# Patient Record
Sex: Male | Born: 1935 | Race: White | Hispanic: No | Marital: Married | Smoking: Never smoker
Health system: Southern US, Community
[De-identification: ages and names within clinical notes are randomized; demographics above are authoritative.]

## PROBLEM LIST (undated history)

## (undated) DIAGNOSIS — I351 Nonrheumatic aortic (valve) insufficiency: Secondary | ICD-10-CM

## (undated) DIAGNOSIS — I459 Conduction disorder, unspecified: Secondary | ICD-10-CM

## (undated) DIAGNOSIS — IMO0002 Reserved for concepts with insufficient information to code with codable children: Secondary | ICD-10-CM

## (undated) DIAGNOSIS — I2510 Atherosclerotic heart disease of native coronary artery without angina pectoris: Secondary | ICD-10-CM

## (undated) DIAGNOSIS — I454 Nonspecific intraventricular block: Secondary | ICD-10-CM

## (undated) DIAGNOSIS — M549 Dorsalgia, unspecified: Secondary | ICD-10-CM

## (undated) DIAGNOSIS — I480 Paroxysmal atrial fibrillation: Secondary | ICD-10-CM

## (undated) DIAGNOSIS — C642 Malignant neoplasm of left kidney, except renal pelvis: Secondary | ICD-10-CM

## (undated) DIAGNOSIS — I10 Essential (primary) hypertension: Secondary | ICD-10-CM

## (undated) DIAGNOSIS — Z8739 Personal history of other diseases of the musculoskeletal system and connective tissue: Secondary | ICD-10-CM

## (undated) DIAGNOSIS — I340 Nonrheumatic mitral (valve) insufficiency: Secondary | ICD-10-CM

## (undated) DIAGNOSIS — Z950 Presence of cardiac pacemaker: Secondary | ICD-10-CM

## (undated) DIAGNOSIS — Z8719 Personal history of other diseases of the digestive system: Secondary | ICD-10-CM

## (undated) DIAGNOSIS — K219 Gastro-esophageal reflux disease without esophagitis: Secondary | ICD-10-CM

## (undated) DIAGNOSIS — I639 Cerebral infarction, unspecified: Secondary | ICD-10-CM

## (undated) DIAGNOSIS — G43909 Migraine, unspecified, not intractable, without status migrainosus: Secondary | ICD-10-CM

## (undated) DIAGNOSIS — N183 Chronic kidney disease, stage 3 unspecified: Secondary | ICD-10-CM

## (undated) DIAGNOSIS — Z5181 Encounter for therapeutic drug level monitoring: Secondary | ICD-10-CM

## (undated) DIAGNOSIS — G8929 Other chronic pain: Secondary | ICD-10-CM

## (undated) DIAGNOSIS — I4892 Unspecified atrial flutter: Secondary | ICD-10-CM

## (undated) DIAGNOSIS — A6920 Lyme disease, unspecified: Secondary | ICD-10-CM

## (undated) DIAGNOSIS — E785 Hyperlipidemia, unspecified: Secondary | ICD-10-CM

## (undated) DIAGNOSIS — I359 Nonrheumatic aortic valve disorder, unspecified: Secondary | ICD-10-CM

## (undated) DIAGNOSIS — I493 Ventricular premature depolarization: Secondary | ICD-10-CM

## (undated) DIAGNOSIS — M1990 Unspecified osteoarthritis, unspecified site: Secondary | ICD-10-CM

## (undated) DIAGNOSIS — I428 Other cardiomyopathies: Secondary | ICD-10-CM

## (undated) DIAGNOSIS — M109 Gout, unspecified: Secondary | ICD-10-CM

## (undated) DIAGNOSIS — R0600 Dyspnea, unspecified: Secondary | ICD-10-CM

## (undated) DIAGNOSIS — M4800 Spinal stenosis, site unspecified: Secondary | ICD-10-CM

## (undated) DIAGNOSIS — C4491 Basal cell carcinoma of skin, unspecified: Secondary | ICD-10-CM

## (undated) DIAGNOSIS — I442 Atrioventricular block, complete: Secondary | ICD-10-CM

## (undated) DIAGNOSIS — G459 Transient cerebral ischemic attack, unspecified: Secondary | ICD-10-CM

## (undated) DIAGNOSIS — Z79899 Other long term (current) drug therapy: Secondary | ICD-10-CM

## (undated) HISTORY — PX: FRACTURE SURGERY: SHX138

## (undated) HISTORY — PX: CARDIAC CATHETERIZATION: SHX172

## (undated) HISTORY — DX: Nonrheumatic aortic valve disorder, unspecified: I35.9

## (undated) HISTORY — DX: Atherosclerotic heart disease of native coronary artery without angina pectoris: I25.10

## (undated) HISTORY — PX: SQUAMOUS CELL CARCINOMA EXCISION: SHX2433

## (undated) HISTORY — PX: PROSTATE SURGERY: SHX751

## (undated) HISTORY — PX: BIOPSY PROSTATE: PRO28

## (undated) HISTORY — DX: Chronic kidney disease, stage 3 unspecified: N18.30

## (undated) HISTORY — DX: Nonrheumatic mitral (valve) insufficiency: I34.0

## (undated) HISTORY — DX: Other cardiomyopathies: I42.8

## (undated) HISTORY — DX: Paroxysmal atrial fibrillation: I48.0

## (undated) HISTORY — DX: Nonrheumatic aortic (valve) insufficiency: I35.1

## (undated) HISTORY — DX: Essential (primary) hypertension: I10

## (undated) HISTORY — PX: BASAL CELL CARCINOMA EXCISION: SHX1214

## (undated) HISTORY — DX: Ventricular premature depolarization: I49.3

## (undated) HISTORY — DX: Cerebral infarction, unspecified: I63.9

## (undated) HISTORY — PX: TONSILLECTOMY AND ADENOIDECTOMY: SUR1326

## (undated) HISTORY — DX: Gout, unspecified: M10.9

## (undated) HISTORY — DX: Unspecified atrial flutter: I48.92

## (undated) HISTORY — DX: Lyme disease, unspecified: A69.20

## (undated) HISTORY — PX: LAPAROSCOPIC ABLATION RENAL MASS: SUR751

## (undated) HISTORY — DX: Hyperlipidemia, unspecified: E78.5

## (undated) HISTORY — PX: OTHER SURGICAL HISTORY: SHX169

---

## 1965-05-06 HISTORY — PX: ANKLE FRACTURE SURGERY: SHX122

## 2004-05-06 DIAGNOSIS — C642 Malignant neoplasm of left kidney, except renal pelvis: Secondary | ICD-10-CM

## 2004-05-06 HISTORY — DX: Malignant neoplasm of left kidney, except renal pelvis: C64.2

## 2004-11-09 ENCOUNTER — Ambulatory Visit: Payer: Self-pay | Admitting: Urology

## 2004-11-27 ENCOUNTER — Ambulatory Visit: Payer: Self-pay | Admitting: Urology

## 2005-02-01 ENCOUNTER — Ambulatory Visit: Payer: Self-pay | Admitting: Urology

## 2005-08-07 ENCOUNTER — Ambulatory Visit: Payer: Self-pay | Admitting: Urology

## 2006-01-14 ENCOUNTER — Ambulatory Visit: Payer: Self-pay | Admitting: Urology

## 2007-01-14 ENCOUNTER — Ambulatory Visit: Payer: Self-pay | Admitting: Urology

## 2007-09-22 ENCOUNTER — Ambulatory Visit: Payer: Self-pay | Admitting: Urology

## 2009-09-26 ENCOUNTER — Ambulatory Visit: Payer: Self-pay | Admitting: Internal Medicine

## 2009-10-04 ENCOUNTER — Ambulatory Visit: Payer: Self-pay | Admitting: Urology

## 2010-04-16 ENCOUNTER — Ambulatory Visit: Payer: Self-pay | Admitting: Cardiology

## 2010-04-16 ENCOUNTER — Encounter (INDEPENDENT_AMBULATORY_CARE_PROVIDER_SITE_OTHER): Payer: Self-pay | Admitting: *Deleted

## 2010-04-16 ENCOUNTER — Encounter: Payer: Self-pay | Admitting: Cardiology

## 2010-04-16 DIAGNOSIS — I10 Essential (primary) hypertension: Secondary | ICD-10-CM

## 2010-04-24 ENCOUNTER — Encounter: Payer: Self-pay | Admitting: Cardiology

## 2010-04-24 ENCOUNTER — Ambulatory Visit: Payer: Self-pay

## 2010-04-24 ENCOUNTER — Ambulatory Visit (HOSPITAL_COMMUNITY)
Admission: RE | Admit: 2010-04-24 | Discharge: 2010-04-24 | Payer: Self-pay | Source: Home / Self Care | Attending: Cardiology | Admitting: Cardiology

## 2010-04-26 ENCOUNTER — Ambulatory Visit: Payer: Self-pay | Admitting: Cardiology

## 2010-04-26 ENCOUNTER — Ambulatory Visit: Payer: Self-pay

## 2010-05-06 HISTORY — PX: INGUINAL HERNIA REPAIR: SUR1180

## 2010-05-08 ENCOUNTER — Encounter: Payer: Self-pay | Admitting: Cardiology

## 2010-05-08 ENCOUNTER — Ambulatory Visit
Admission: RE | Admit: 2010-05-08 | Discharge: 2010-05-08 | Payer: Self-pay | Source: Home / Self Care | Attending: Cardiovascular Disease | Admitting: Cardiovascular Disease

## 2010-05-10 LAB — CONVERTED CEMR LAB
BUN: 18 mg/dL (ref 6–23)
Chloride: 100 meq/L (ref 96–112)
Potassium: 4 meq/L (ref 3.5–5.3)

## 2010-06-07 NOTE — Letter (Signed)
Summary: Patient Receipt of NPP  Patient Receipt of NPP   Imported By: Marylou Mccoy 04/24/2010 08:55:06  _____________________________________________________________________  External Attachment:    Type:   Image     Comment:   External Document

## 2010-06-07 NOTE — Assessment & Plan Note (Signed)
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 I had done a catheterization in the 1990s which showed disease in a small branch that we treated medically. Recently he was found on examination to have a murmur and was told by Dr. Harl Bowie that he had aortic stenosis. He recommended restricted activity pending further evaluation.  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 He runs a 45 miles every other day. He has continued to do this. He's had no symptoms of chest pain shortness of breath or palpitations.  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  Current Medications (verified): 1)  Enalapril Maleate 5 Mg Tabs (Enalapril Maleate) .... Take One Daily 2)  Terazosin Hcl 1 Mg Caps (Terazosin Hcl) .... Take One Daily 3)  Avodart 0.5 Mg Caps (Dutasteride) .... Take One Daily 4)  Allopurinol 300 Mg Tabs (Allopurinol) .... Take 1/2 Daily 5)  Daily-Vitamin  Tabs (Multiple Vitamin) .... Take One Daily 6)  Fish Oil 1000 Mg Caps (Omega-3 Fatty Acids) .... Take One Daily 7)  Propranolol Hcl 20 Mg Tabs (Propranolol Hcl) .... Take One Daily  Allergies (verified): 1)  ! Pcn  Past History:  Past Medical History: He had prior ablation of a renal cell carcinoma He had microwave therapy of his prostate He had biopsy of the prostate x2 in 2001 in 2003. He also has a history of hypertension  Family History: His mother died at age 92 and his father died at age 58. There is no known heart disease in his parents but his brother had an MI in his 24s.  Social History: He is married He is a Programmer, multimedia He does not smoke  Review of Systems       ROS is negative except as outlined in HPI.   Vital Signs:  Patient profile:   77 year old male Height:      68 inches Weight:      175 pounds BMI:     26.70 Pulse rate:   68 / minute Pulse rhythm:   regular BP sitting:    158 / 98  (right arm)  Vitals Entered By: Jacquelin Hawking, CMA (April 16, 2010 3:07 PM)  Physical Exam  Additional Exam:  Gen. Well-nourished, in no distress   Neck: No JVD, thyroid not enlarged, no carotid bruits, the carotids felt full and I was not certain I could hear a bruit transmitted to the carotids Lungs: No tachypnea, clear without rales, rhonchi or wheezes Cardiovascular: Rhythm regular, PMI not displaced,  heart sounds  normal, grade 2/6 systolic ejection murmur at the left sternal edge, no diastolic murmur,, no peripheral edema, pulses normal in all 4 extremities. Abdomen: BS normal, abdomen soft and non-tender without masses or organomegaly, no hepatosplenomegaly. MS: No deformities, no cyanosis or clubbing   Neuro:  No focal sns   Skin:  no lesions    Impression & Recommendations:  Problem # 1:  AORTIC STENOSIS/ INSUFFICIENCY, NON-RHEUMATIC (ICD-424.1) He has a murmur of aortic stenosis but based on the physical findings this appears to be mild. He has had no symptoms of chest pain shortness of breath or palpitations. I think he needs further evaluation with an echocardiogram. I also think we should do a stress test to reevaluate his coronary disease and to see if there is any limitation related to valvular heart disease. His updated medication list for this  problem includes:    Enalapril Maleate 5 Mg Tabs (Enalapril maleate) .Marland Kitchen... Take one daily    Propranolol Hcl 20 Mg Tabs (Propranolol hcl) .Marland Kitchen... Take one daily  Orders: EKG w/ Interpretation (93000) Treadmill (Treadmill) Echocardiogram (Echo)  Problem # 2:  HYPERTENSION, BENIGN (ICD-401.1) His blood pressure is elevated today but he says it has been good on other readings. We will not change his medications today. His updated medication list for this problem includes:    Enalapril Maleate 5 Mg Tabs (Enalapril maleate) .Marland Kitchen... Take one daily    Terazosin Hcl 1 Mg Caps (Terazosin hcl) .Marland Kitchen... Take one daily     Propranolol Hcl 20 Mg Tabs (Propranolol hcl) .Marland Kitchen... Take one daily  His updated medication list for this problem includes:    Enalapril Maleate 5 Mg Tabs (Enalapril maleate) .Marland Kitchen... Take one daily    Terazosin Hcl 1 Mg Caps (Terazosin hcl) .Marland Kitchen... Take one daily    Propranolol Hcl 20 Mg Tabs (Propranolol hcl) .Marland Kitchen... Take one daily  Patient Instructions: 1)  Your physician recommends that you continue on your current medications as directed. Please refer to the Current Medication list given to you today. 2)  Your physician has requested that you have an echocardiogram prior to your treadmill test- on a different day.  Echocardiography is a painless test that uses sound waves to create images of your heart. It provides your doctor with information about the size and shape of your heart and how well your heart's chambers and valves are working.  This procedure takes approximately one hour. There are no restrictions for this procedure. 3)  Your physician has requested that you have an exercise tolerance test.  For further information please visit [email or web address]  Please also follow instruction sheet, as given.

## 2010-06-07 NOTE — Miscellaneous (Signed)
Summary: RX  Clinical Lists Changes  Medications: Changed medication from ENALAPRIL MALEATE 20 MG TABS (ENALAPRIL MALEATE) Take one tablet by mouth once daily to ENALAPRIL MALEATE 10 MG TABS (ENALAPRIL MALEATE) take one tablet by mouth once daily - Signed Changed medication from PROPRANOLOL HCL 20 MG TABS (PROPRANOLOL HCL) take one daily to PROPRANOLOL HCL 20 MG TABS (PROPRANOLOL HCL) take one daily - Signed Rx of ENALAPRIL MALEATE 10 MG TABS (ENALAPRIL MALEATE) take one tablet by mouth once daily;  #100 x 3;  Signed;  Entered by: Sherri Rad, RN, BSN;  Authorized by: Lenoria Farrier, MD, [facility];  Method used: Electronically to [facility] [address], [place], [place], [place]  [address], Ph: [phone], Fax: [phone] Rx of PROPRANOLOL HCL 20 MG TABS (PROPRANOLOL HCL) take one daily;  #100 x 3;  Signed;  Entered by: Sherri Rad, RN, BSN;  Authorized by: Lenoria Farrier, MD, [facility];  Method used: Electronically to [facility] [address], [place], [place], [place]  [address], Ph: [phone], Fax: [phone]    Prescriptions: PROPRANOLOL HCL 20 MG TABS (PROPRANOLOL HCL) take one daily  #100 x 3   Entered by:   Sherri Rad, RN, BSN   Authorized by:   Lenoria Farrier, MD, [facility]   Signed by:   Sherri Rad, RN, BSN on 04/26/2010   Method used:   Electronically to        [facility] (retail)       [address]       [place], [place]  [address]       Ph: [phone]       Fax: [phone]   RxID:   [id number] ENALAPRIL MALEATE 10 MG TABS (ENALAPRIL MALEATE) take one tablet by mouth once daily  #100 x 3   Entered by:   Sherri Rad, RN, BSN   Authorized by:   Lenoria Farrier, MD, [facility]   Signed by:   Sherri Rad, RN, BSN on 04/26/2010   Method used:   Electronically to        [facility] (retail)       [address]       [place], [place]  [address]       Ph: [phone]       Fax: [phone]   RxID:   [id number]

## 2010-09-25 ENCOUNTER — Telehealth: Payer: Self-pay | Admitting: Cardiovascular Disease

## 2010-09-25 NOTE — Telephone Encounter (Signed)
LMOM to call and schedule f/u appt for June with Gollan.

## 2010-11-02 ENCOUNTER — Ambulatory Visit (INDEPENDENT_AMBULATORY_CARE_PROVIDER_SITE_OTHER): Payer: Medicare Other | Admitting: Cardiovascular Disease

## 2010-11-02 ENCOUNTER — Encounter: Payer: Self-pay | Admitting: Cardiovascular Disease

## 2010-11-02 DIAGNOSIS — I359 Nonrheumatic aortic valve disorder, unspecified: Secondary | ICD-10-CM

## 2010-11-02 DIAGNOSIS — E785 Hyperlipidemia, unspecified: Secondary | ICD-10-CM | POA: Insufficient documentation

## 2010-11-02 DIAGNOSIS — I10 Essential (primary) hypertension: Secondary | ICD-10-CM

## 2010-11-02 MED ORDER — ENALAPRIL MALEATE 5 MG PO TABS: 10.0000 mg | ORAL_TABLET | Freq: Every day | ORAL | Status: DC

## 2010-11-02 MED ORDER — ATORVASTATIN CALCIUM 10 MG PO TABS: 10.0000 mg | ORAL_TABLET | Freq: Every day | ORAL | Status: DC

## 2010-11-02 MED ORDER — PROPRANOLOL HCL 20 MG PO TABS: 40.0000 mg | ORAL_TABLET | ORAL | Status: DC

## 2010-11-02 MED ORDER — ALLOPURINOL 300 MG PO TABS: 300.0000 mg | ORAL_TABLET | Freq: Every day | ORAL | Status: DC

## 2010-11-02 NOTE — Assessment & Plan Note (Signed)
Echocardiogram does not confirm any significant valvular disease. No further workup needed.

## 2010-11-02 NOTE — Progress Notes (Signed)
   Patient ID: Thomas [name], male    DOB: 1933-03-23, 77 y.o.   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 He does have a history of PVCs at nighttime and takes propranolol.   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 He runs a 4 to 5 miles every other day.  He's had no symptoms of chest pain shortness of breath or palpitations. He does take propranolol either 20 mg or 40 mg in the evening for palpitations. Blood pressure has been relatively well controlled on his current medication regimen. Enalapril was increased from 5 to10 mg for blood pressure control.   EKG shows normal sinus rhythm with rate 65 beats per minute with no significant ST or T wave changes        Review of Systems  Constitutional: Negative.   HENT: Negative.   Eyes: Negative.   Respiratory: Negative.   Cardiovascular: Positive for palpitations.  Gastrointestinal: Negative.   Musculoskeletal: Negative.   Skin: Negative.   Neurological: Negative.   Hematological: Negative.   Psychiatric/Behavioral: Negative.   All other systems reviewed and are negative.    BP 140/86  Pulse 66  Ht 5\' 7"  (1.702 m)  Wt 174 lb (78.926 kg)  BMI 27.25 kg/m2  Physical Exam  Nursing note and vitals reviewed. Constitutional: He is oriented to person, place, and time. He appears well-developed and well-nourished.  HENT:  Head: Normocephalic.  Nose: Nose normal.  Mouth/Throat: Oropharynx is clear and moist.  Eyes: Conjunctivae are normal. Pupils are equal, round, and reactive to light.  Neck: Normal range of motion. Neck supple. No JVD present.  Cardiovascular: Normal rate, regular rhythm, S1 normal, S2 normal, normal heart sounds and intact distal pulses.   Exam reveals no gallop and no friction rub.   No murmur heard. Pulmonary/Chest: Effort normal and breath sounds normal. No respiratory distress. He has no wheezes. He has no rales. He exhibits no tenderness.  Abdominal: Soft. Bowel sounds are normal. He exhibits no distension. There is no tenderness.  Musculoskeletal: Normal range of motion. He exhibits no edema and no tenderness.  Lymphadenopathy:    He has no cervical adenopathy.  Neurological: He is alert and oriented to person, place, and time. Coordination normal.  Skin: Skin is warm and dry. No rash noted. No erythema.  Psychiatric: He has a normal mood and affect. His behavior is normal. Judgment and thought content normal.           Assessment and Plan

## 2010-11-02 NOTE — Assessment & Plan Note (Signed)
Cholesterol last year in May was 209, LDL 123, HDL 73. He reports that 2 of his brothers have had bypass surgery. Both are younger than him. We have suggested he start Lipitor 10 mg daily with a check of his cholesterol in 3 months time.

## 2010-11-02 NOTE — Assessment & Plan Note (Signed)
Blood pressure is well controlled on today's visit. No changes made to the medications. 

## 2010-11-02 NOTE — Patient Instructions (Addendum)
You are doing well. Please start lipitor 10 mg daily. Please call us if you have new issues that need to be addressed before your next appt.   Your physician recommends that you return for a FASTING lipid profile: 3 months (lipid/lft)  We will call you for a follow up Appt. In 12 months

## 2010-11-05 ENCOUNTER — Ambulatory Visit: Payer: Self-pay | Admitting: Surgery

## 2010-11-09 ENCOUNTER — Ambulatory Visit: Payer: Self-pay | Admitting: Surgery

## 2011-11-11 ENCOUNTER — Ambulatory Visit (INDEPENDENT_AMBULATORY_CARE_PROVIDER_SITE_OTHER): Payer: Medicare Other | Admitting: Cardiovascular Disease

## 2011-11-11 ENCOUNTER — Encounter: Payer: Self-pay | Admitting: Cardiovascular Disease

## 2011-11-11 VITALS — BP 130/70 | HR 62 | Ht 68.0 in | Wt 175.8 lb

## 2011-11-11 DIAGNOSIS — E785 Hyperlipidemia, unspecified: Secondary | ICD-10-CM

## 2011-11-11 DIAGNOSIS — I2510 Atherosclerotic heart disease of native coronary artery without angina pectoris: Secondary | ICD-10-CM

## 2011-11-11 DIAGNOSIS — I359 Nonrheumatic aortic valve disorder, unspecified: Secondary | ICD-10-CM

## 2011-11-11 DIAGNOSIS — I10 Essential (primary) hypertension: Secondary | ICD-10-CM

## 2011-11-11 NOTE — Assessment & Plan Note (Signed)
We have suggested that we have his cholesterol done this week in the hospital at his convenience.

## 2011-11-11 NOTE — Assessment & Plan Note (Signed)
Blood pressure is well controlled on today's visit. No changes made to the medications. 

## 2011-11-11 NOTE — Patient Instructions (Addendum)
You are doing well. No medication changes were made.  Please call us if you have new issues that need to be addressed before your next appt.  Your physician wants you to follow-up in: 12 months.  You will receive a reminder letter in the mail two months in advance. If you don't receive a letter, please call our office to schedule the follow-up appointment. 

## 2011-11-11 NOTE — Assessment & Plan Note (Signed)
No significant aortic valve or mitral valve stenosis. Clinical exam with no significant murmur. Last echocardiogram 2 years ago was essentially benign.

## 2011-11-11 NOTE — Progress Notes (Signed)
Patient ID: Thomas [name], male    DOB: 24-Jun-1932, 78 y.o.   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 He does have a history of PVCs at nighttime and takes propranolol.   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 He runs a 4 to 5 miles every other day.  He's had no symptoms of chest pain shortness of breath or palpitations. He does take propranolol either 20 mg or 40 mg in the evening for palpitations. Blood pressure has been relatively well controlled. 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 He has some aches in his hands and back and wonders if it could be from the Lipitor.   EKG shows normal sinus rhythm with rate 62 beats per minute with no significant ST or T wave changes     Outpatient Encounter Prescriptions as of 11/11/2011  Medication Sig Dispense Refill  . allopurinol (ZYLOPRIM) 300 MG tablet Take 1 tablet (300 mg total) by mouth daily.  90 tablet  4  . atorvastatin (LIPITOR) 10 MG tablet Take 1 tablet (10 mg total) by mouth daily.  90 tablet  4  . dutasteride (AVODART) 0.5 MG capsule Take 0.5 mg by mouth daily.        . enalapril (VASOTEC) 5 MG tablet Take 2 tablets (10 mg total) by mouth daily.  180 tablet  4  . Multiple Vitamin (MULTIVITAMIN) tablet Take 1 tablet by mouth daily.        . Omega-3 Fatty Acids (FISH OIL) 1000 MG CAPS Take 1,000 mg by mouth 1 dose over 24 hours.        . propranolol (INDERAL) 20 MG tablet Take 2 tablets (40 mg total) by mouth 1 dose over 24 hours.  180 tablet  4  . terazosin (HYTRIN) 1 MG capsule Take 1 mg by mouth at bedtime.          Review of Systems  Constitutional: Negative.   HENT: Negative.   Eyes: Negative.   Respiratory: Negative.     Cardiovascular: Positive for palpitations.  Gastrointestinal: Negative.   Musculoskeletal: Negative.   Skin: Negative.   Neurological: Negative.   Hematological: Negative.   Psychiatric/Behavioral: Negative.   All other systems reviewed and are negative.    BP 130/70  Pulse 62  Ht 5\' 8"  (1.727 m)  Wt 175 lb 12 oz (79.72 kg)  BMI 26.72 kg/m2  Physical Exam  Nursing note and vitals reviewed. Constitutional: He is oriented to person, place, and time. He appears well-developed and well-nourished.  HENT:  Head: Normocephalic.  Nose: Nose normal.  Mouth/Throat: Oropharynx is clear and moist.  Eyes: Conjunctivae are normal. Pupils are equal, round, and reactive to light.  Neck: Normal range of motion. Neck supple. No JVD present.  Cardiovascular: Normal rate, regular rhythm, S1 normal, S2 normal, normal heart sounds and intact distal pulses.  Exam reveals no gallop and no friction rub.   No murmur heard. Pulmonary/Chest: Effort normal and breath sounds normal. No respiratory distress. He has no wheezes. He has no rales. He exhibits no tenderness.  Abdominal: Soft. Bowel sounds are normal. He exhibits no distension. There is no tenderness.  Musculoskeletal: Normal range of motion. He exhibits no edema  and no tenderness.  Lymphadenopathy:    He has no cervical adenopathy.  Neurological: He is alert and oriented to person, place, and time. Coordination normal.  Skin: Skin is warm and dry. No rash noted. No erythema.  Psychiatric: He has a normal mood and affect. His behavior is normal. Judgment and thought content normal.           Assessment and Plan

## 2011-11-12 ENCOUNTER — Other Ambulatory Visit: Payer: Self-pay | Admitting: Cardiovascular Disease

## 2011-11-12 LAB — LIPID PANEL
Cholesterol: 127 mg/dL (ref 0–200)
VLDL Cholesterol, Calc: 7 mg/dL (ref 5–40)

## 2011-11-12 LAB — HEPATIC FUNCTION PANEL A (ARMC)
Alkaline Phosphatase: 73 U/L (ref 50–136)
Bilirubin,Total: 0.5 mg/dL (ref 0.2–1.0)
SGOT(AST): 23 U/L (ref 15–37)
SGPT (ALT): 22 U/L

## 2011-12-03 ENCOUNTER — Other Ambulatory Visit: Payer: Self-pay | Admitting: Cardiovascular Disease

## 2011-12-03 NOTE — Telephone Encounter (Signed)
Refilled Atorvastatin.

## 2012-02-04 ENCOUNTER — Telehealth: Payer: Self-pay | Admitting: Cardiovascular Disease

## 2012-02-04 NOTE — Telephone Encounter (Signed)
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 He states that for about the last 6 months, he has noticed a slight increase in his renal function. He coincidently had labs drawn at the end of September for suspected [facility] Spotted fever (he did have a + titer). On 9/20 his creatinine was 1.4 and K+ was 6.1 (he is not certain this was not hemolyzed and was not treated in any special way). On 9/27 his creatinine was 1.38 with a K+ of 4.6. He is slightly concerned that his enalapril may be causing a slight rise in his creatinine. He is on 10 mg once daily. He states he was previously on enalapril 5 mg once daily until increased by Dr. Mariah Milling. He wanted to make Dr. Mariah Milling aware of this rise and see if he wanted to recheck any labs or make any med adjustments. I advised I would forward to Dr. Mariah Milling for review and that we will be back in touch with him tomorrow. He will be at his work #. He is agreeable.

## 2012-02-04 NOTE — Telephone Encounter (Signed)
Pt would like to discuss his enalapril and the increase of his creatinine.

## 2012-02-04 NOTE — Telephone Encounter (Signed)
We can do anything that he would like... One option would be to recheck labs in several weeks' time and hydrate  by mouth with fluids prior to blood draw. Sometimes we do see mild elevation of creatinine if he is mildly dehydrated. We could certainly change his ACE inhibitor to an alternate medication if he is concerned. Creatinine is at the high end of normal and certainly needs to be closely monitored. Typically ACE inhibitor is used for renal protection, though there are cases where needs to be discontinued

## 2012-02-04 NOTE — Telephone Encounter (Signed)
I attempted to call the patient. I was told he is in a procedure at this time. I left a message for him to call.

## 2012-02-05 NOTE — Telephone Encounter (Signed)
LMTCB

## 2012-02-11 NOTE — Telephone Encounter (Signed)
Line busy

## 2012-02-11 NOTE — Telephone Encounter (Signed)
Pt called and LMTCB at [phone]

## 2012-02-12 ENCOUNTER — Other Ambulatory Visit: Payer: Self-pay

## 2012-02-12 DIAGNOSIS — R7989 Other specified abnormal findings of blood chemistry: Secondary | ICD-10-CM

## 2012-02-12 NOTE — Telephone Encounter (Signed)
Pt informed of Dr. Windell Hummingbird response.  He chooses to have BMP repeated in a few weeks. He will try to stay well hydrated.  Appt made for BMP.

## 2012-02-12 NOTE — Telephone Encounter (Signed)
LMTCB

## 2012-02-26 ENCOUNTER — Other Ambulatory Visit: Payer: Medicare Other

## 2012-03-03 ENCOUNTER — Ambulatory Visit (INDEPENDENT_AMBULATORY_CARE_PROVIDER_SITE_OTHER): Payer: Medicare Other

## 2012-03-03 ENCOUNTER — Other Ambulatory Visit: Payer: Medicare Other

## 2012-03-03 DIAGNOSIS — R799 Abnormal finding of blood chemistry, unspecified: Secondary | ICD-10-CM

## 2012-03-03 DIAGNOSIS — R7989 Other specified abnormal findings of blood chemistry: Secondary | ICD-10-CM

## 2012-03-04 LAB — BASIC METABOLIC PANEL
BUN/Creatinine Ratio: 15 (ref 10–22)
CO2: 26 mmol/L (ref 19–28)
Creatinine, Ser: 1.71 mg/dL — ABNORMAL HIGH (ref 0.76–1.27)
GFR calc non Af Amer: 38 mL/min/{1.73_m2} — ABNORMAL LOW (ref >59)
Potassium: 5.1 mmol/L (ref 3.5–5.2)
Sodium: 137 mmol/L (ref 134–144)

## 2012-03-14 ENCOUNTER — Other Ambulatory Visit: Payer: Self-pay | Admitting: Cardiovascular Disease

## 2012-03-16 ENCOUNTER — Other Ambulatory Visit: Payer: Self-pay

## 2012-03-16 ENCOUNTER — Other Ambulatory Visit: Payer: Self-pay | Admitting: *Deleted

## 2012-03-16 MED ORDER — PROPRANOLOL HCL 20 MG PO TABS: 20.0000 mg | ORAL_TABLET | Freq: Every day | ORAL | Status: DC

## 2012-03-16 MED ORDER — AMLODIPINE BESYLATE 5 MG PO TABS: 5.0000 mg | ORAL_TABLET | Freq: Every day | ORAL | Status: DC

## 2012-03-16 NOTE — Telephone Encounter (Signed)
Refilled Propranolol ?

## 2012-04-21 ENCOUNTER — Ambulatory Visit: Payer: Self-pay | Admitting: Specialist

## 2012-06-04 ENCOUNTER — Other Ambulatory Visit: Payer: Self-pay

## 2012-06-04 DIAGNOSIS — Z79899 Other long term (current) drug therapy: Secondary | ICD-10-CM

## 2012-06-04 DIAGNOSIS — E786 Lipoprotein deficiency: Secondary | ICD-10-CM

## 2012-06-26 ENCOUNTER — Other Ambulatory Visit: Payer: Self-pay | Admitting: Cardiovascular Disease

## 2012-06-26 LAB — COMPREHENSIVE METABOLIC PANEL
Alkaline Phosphatase: 68 U/L (ref 50–136)
BUN: 16 mg/dL (ref 7–18)
Bilirubin,Total: 0.6 mg/dL (ref 0.2–1.0)
Chloride: 105 mmol/L (ref 98–107)
EGFR (African American): >60
EGFR (Non-African Amer.): 52 — ABNORMAL LOW
Osmolality: 277 (ref 275–301)
Total Protein: 7.5 g/dL (ref 6.4–8.2)

## 2012-06-26 LAB — LIPID PANEL
Cholesterol: 144 mg/dL (ref 0–200)
HDL Cholesterol: 61 mg/dL — ABNORMAL HIGH (ref 40–60)
Ldl Cholesterol, Calc: 74 mg/dL (ref 0–100)
Triglycerides: 46 mg/dL (ref 0–200)

## 2012-06-26 LAB — BILIRUBIN, DIRECT: Bilirubin, Direct: 0.2 mg/dL (ref 0.00–0.20)

## 2012-07-13 ENCOUNTER — Telehealth: Payer: Self-pay

## 2012-07-13 NOTE — Telephone Encounter (Signed)
The patient is adding these medications to his list all other medications are correct.  Aspirin 81 mg one tablet daily celebrex 200 mg one tablet qod  nexium 40 mg one tablet daily prn Furosemide 20 mg one tablet prn for swelling   Does not take a potassium tablet.

## 2012-07-29 ENCOUNTER — Ambulatory Visit: Payer: Self-pay | Admitting: Urology

## 2012-08-04 ENCOUNTER — Ambulatory Visit: Payer: Self-pay | Admitting: Anesthesiology

## 2012-09-02 ENCOUNTER — Ambulatory Visit: Payer: Self-pay | Admitting: Anesthesiology

## 2012-10-28 ENCOUNTER — Ambulatory Visit: Payer: Self-pay | Admitting: Anesthesiology

## 2012-11-10 ENCOUNTER — Ambulatory Visit: Payer: Self-pay | Admitting: Specialist

## 2012-11-12 ENCOUNTER — Ambulatory Visit (INDEPENDENT_AMBULATORY_CARE_PROVIDER_SITE_OTHER): Payer: Medicare Other | Admitting: Cardiovascular Disease

## 2012-11-12 ENCOUNTER — Encounter: Payer: Self-pay | Admitting: Cardiovascular Disease

## 2012-11-12 VITALS — BP 140/82 | HR 62 | Ht 68.0 in | Wt 169.5 lb

## 2012-11-12 DIAGNOSIS — E785 Hyperlipidemia, unspecified: Secondary | ICD-10-CM

## 2012-11-12 DIAGNOSIS — R002 Palpitations: Secondary | ICD-10-CM

## 2012-11-12 DIAGNOSIS — I10 Essential (primary) hypertension: Secondary | ICD-10-CM

## 2012-11-12 MED ORDER — LISINOPRIL 5 MG PO TABS: 5.0000 mg | ORAL_TABLET | Freq: Every day | ORAL | Status: DC

## 2012-11-12 MED ORDER — PROPRANOLOL HCL 40 MG PO TABS: 40.0000 mg | ORAL_TABLET | Freq: Three times a day (TID) | ORAL | Status: DC | PRN

## 2012-11-12 NOTE — Patient Instructions (Addendum)
You are doing well. No medication changes were made.  Please call us if you have new issues that need to be addressed before your next appt.  Your physician wants you to follow-up in: 12 months.  You will receive a reminder letter in the mail two months in advance. If you don't receive a letter, please call our office to schedule the follow-up appointment. 

## 2012-11-12 NOTE — Assessment & Plan Note (Signed)
Cholesterol is at goal on the current lipid regimen. No changes to the medications were made.  

## 2012-11-12 NOTE — Assessment & Plan Note (Signed)
Blood pressure is well controlled on today's visit. No changes made to the medications. 

## 2012-11-12 NOTE — Progress Notes (Signed)
Patient ID: Thomas [name], male    DOB: 11-08-1932, 79 y.o.   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 He does have a history of PVCs at nighttime and takes propranolol.   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 He runs a 4 to 5 miles every other day.  He's had no symptoms of chest pain shortness of breath or palpitations. He does take propranolol either 20 mg or 40 mg in the evening for palpitations. Blood pressure has been relatively well controlled. He did not tolerate amlodipine as this caused worsening nightmares. Blood pressure been well-controlled on low-dose lisinopril 2.5 mg daily.  Previously he had mildly elevated creatinine. Enalapril was held at the time . Creatinine in February 2014 was 1.3.    EKG shows normal sinus rhythm with rate 62 beats per minute with no significant ST or T wave changes     Outpatient Encounter Prescriptions as of 11/12/2012  Medication Sig Dispense Refill  . allopurinol (ZYLOPRIM) 300 MG tablet Take 1 tablet (300 mg total) by mouth daily.  90 tablet  4  . aspirin 81 MG tablet Take 81 mg by mouth daily.      Marland Kitchen atorvastatin (LIPITOR) 10 MG tablet TAKE ONE (1) TABLET EACH DAY  90 tablet  5  . celecoxib (CELEBREX) 200 MG capsule Take 200 mg by mouth daily as needed.       . COLCRYS 0.6 MG tablet Take 0.6 mg by mouth as needed.       . dutasteride (AVODART) 0.5 MG capsule Take 0.5 mg by mouth daily.        Marland Kitchen esomeprazole (NEXIUM) 40 MG capsule Take 40 mg by mouth daily as needed.       . furosemide (LASIX) 20 MG tablet Take 20 mg by mouth daily as needed.      Marland Kitchen lisinopril (PRINIVIL,ZESTRIL) 2.5 MG tablet Take 2.5 mg by mouth daily.      . Multiple Vitamin (MULTIVITAMIN) tablet Take 1 tablet  by mouth daily.        . Omega-3 Fatty Acids (FISH OIL) 1000 MG CAPS Take 1,000 mg by mouth 1 dose over 24 hours.        . propranolol (INDERAL) 40 MG tablet TAKE ONE (1) TABLET EACH DAY  90 tablet  3  . terazosin (HYTRIN) 1 MG capsule Take 1 mg by mouth at bedtime.        . vitamin E 400 UNIT capsule Take 400 Units by mouth daily.      . [DISCONTINUED] amLODipine (NORVASC) 5 MG tablet Take 1 tablet (5 mg total) by mouth daily.  90 tablet  3   No facility-administered encounter medications on file as of 11/12/2012.    Review of Systems  Constitutional: Negative.   HENT: Negative.   Eyes: Negative.   Respiratory: Negative.   Cardiovascular: Positive for palpitations.  Gastrointestinal: Negative.   Musculoskeletal: Negative.   Skin: Negative.   Neurological: Negative.   Psychiatric/Behavioral: Negative.   All other systems reviewed and are negative.    BP 140/82  Pulse 62  Ht 5\' 8"  (1.727 m)  Wt 169 lb 8 oz (76.885 kg)  BMI 25.78 kg/m2  Physical Exam  Nursing note and vitals reviewed. Constitutional:  He is oriented to person, place, and time. He appears well-developed and well-nourished.  HENT:  Head: Normocephalic.  Nose: Nose normal.  Mouth/Throat: Oropharynx is clear and moist.  Eyes: Conjunctivae are normal. Pupils are equal, round, and reactive to light.  Neck: Normal range of motion. Neck supple. No JVD present.  Cardiovascular: Normal rate, regular rhythm, S1 normal, S2 normal, normal heart sounds and intact distal pulses.  Exam reveals no gallop and no friction rub.   No murmur heard. Pulmonary/Chest: Effort normal and breath sounds normal. No respiratory distress. He has no wheezes. He has no rales. He exhibits no tenderness.  Abdominal: Soft. Bowel sounds are normal. He exhibits no distension. There is no tenderness.  Musculoskeletal: Normal range of motion. He exhibits no edema and no tenderness.  Lymphadenopathy:    He has no cervical adenopathy.  Neurological:  He is alert and oriented to person, place, and time. Coordination normal.  Skin: Skin is warm and dry. No rash noted. No erythema.  Psychiatric: He has a normal mood and affect. His behavior is normal. Judgment and thought content normal.      Assessment and Plan

## 2012-11-25 ENCOUNTER — Ambulatory Visit: Payer: Self-pay | Admitting: Anesthesiology

## 2012-12-09 ENCOUNTER — Other Ambulatory Visit: Payer: Self-pay

## 2013-02-02 ENCOUNTER — Other Ambulatory Visit: Payer: Self-pay | Admitting: *Deleted

## 2013-02-02 MED ORDER — ATORVASTATIN CALCIUM 10 MG PO TABS: ORAL_TABLET | ORAL | Status: DC

## 2013-02-02 NOTE — Telephone Encounter (Signed)
Refilled Lipitor sent to [facility].

## 2013-03-11 ENCOUNTER — Other Ambulatory Visit: Payer: Self-pay

## 2013-09-13 ENCOUNTER — Other Ambulatory Visit: Payer: Self-pay | Admitting: Cardiovascular Disease

## 2013-12-31 ENCOUNTER — Ambulatory Visit (INDEPENDENT_AMBULATORY_CARE_PROVIDER_SITE_OTHER): Payer: Medicare Other | Admitting: Cardiovascular Disease

## 2013-12-31 ENCOUNTER — Encounter: Payer: Self-pay | Admitting: Cardiovascular Disease

## 2013-12-31 VITALS — BP 144/80 | HR 68 | Ht 68.0 in | Wt 172.8 lb

## 2013-12-31 DIAGNOSIS — R002 Palpitations: Secondary | ICD-10-CM | POA: Insufficient documentation

## 2013-12-31 DIAGNOSIS — I359 Nonrheumatic aortic valve disorder, unspecified: Secondary | ICD-10-CM

## 2013-12-31 DIAGNOSIS — I10 Essential (primary) hypertension: Secondary | ICD-10-CM

## 2013-12-31 DIAGNOSIS — E785 Hyperlipidemia, unspecified: Secondary | ICD-10-CM

## 2013-12-31 MED ORDER — PROPRANOLOL HCL 40 MG PO TABS: 40.0000 mg | ORAL_TABLET | Freq: Three times a day (TID) | ORAL | Status: DC | PRN

## 2013-12-31 MED ORDER — LISINOPRIL 5 MG PO TABS: 5.0000 mg | ORAL_TABLET | Freq: Two times a day (BID) | ORAL | Status: DC

## 2013-12-31 MED ORDER — ATORVASTATIN CALCIUM 10 MG PO TABS: ORAL_TABLET | ORAL | Status: DC

## 2013-12-31 MED ORDER — ALLOPURINOL 300 MG PO TABS: 300.0000 mg | ORAL_TABLET | Freq: Every day | ORAL | Status: DC

## 2013-12-31 MED ORDER — FUROSEMIDE 20 MG PO TABS: 20.0000 mg | ORAL_TABLET | Freq: Every day | ORAL | Status: DC | PRN

## 2013-12-31 NOTE — Patient Instructions (Signed)
You are doing well. No medication changes were made.  Please call us if you have new issues that need to be addressed before your next appt.  Your physician wants you to follow-up in: 12 months.  You will receive a reminder letter in the mail two months in advance. If you don't receive a letter, please call our office to schedule the follow-up appointment. 

## 2013-12-31 NOTE — Assessment & Plan Note (Signed)
He'll have lab work done with Dr. Rosanna Randy. We can do cholesterol if needed

## 2013-12-31 NOTE — Progress Notes (Signed)
Patient ID: Thomas [name], male    DOB: [birth year], 80 y.o.   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 He does have a history of PVCs at nighttime and takes propranolol.   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 He has not been running as much secondary to chronic neck pain. This has improved recently He is working in the [employer] in [place] Denies any shortness of breath, chest pain or palpitations. Occasional palpitations at nighttime. Continues to take propranolol  Blood pressure has been running mildly elevated at times. Sometimes he takes extra Hytrin  He did not tolerate amlodipine in the past as this caused worsening nightmares.  He has been cutting his lisinopril in half No recent renal function lab work. Enalapril was changed to lisinopril on his request  Creatinine in February 2014 was 1.3.    EKG shows normal sinus rhythm  with no significant ST or T wave changes     Outpatient Encounter Prescriptions as of 12/31/2013  Medication Sig  . allopurinol (ZYLOPRIM) 300 MG tablet Take 1 tablet (300 mg total) by mouth daily.  Marland Kitchen aspirin 81 MG tablet Take 81 mg by mouth daily.  Marland Kitchen atorvastatin (LIPITOR) 10 MG tablet TAKE ONE (1) TABLET EACH DAY  . celecoxib (CELEBREX) 200 MG capsule Take 200 mg by mouth daily as needed.   . COLCRYS 0.6 MG tablet Take 0.6 mg by mouth as needed.   . dutasteride (AVODART) 0.5 MG capsule Take 0.5 mg by mouth daily.    Marland Kitchen esomeprazole (NEXIUM) 40 MG capsule Take 40 mg by mouth daily as needed.   . furosemide (LASIX) 20 MG tablet Take 20 mg by mouth daily as needed.  Marland Kitchen lisinopril (PRINIVIL,ZESTRIL) 5 MG tablet Take 1 tablet (5 mg total) by mouth daily.  . Multiple Vitamin  (MULTIVITAMIN) tablet Take 1 tablet by mouth daily.    . Omega-3 Fatty Acids (FISH OIL) 1000 MG CAPS Take 1,000 mg by mouth 1 dose over 24 hours.    . propranolol (INDERAL) 40 MG tablet Take 1 tablet (40 mg total) by mouth 3 (three) times daily as needed.  . terazosin (HYTRIN) 1 MG capsule Take 1 mg by mouth at bedtime.    . vitamin E 400 UNIT capsule Take 400 Units by mouth daily.    Review of Systems  Constitutional: Negative.   HENT: Negative.   Eyes: Negative.   Respiratory: Negative.   Cardiovascular: Positive for palpitations.  Gastrointestinal: Negative.   Musculoskeletal: Negative.   Skin: Negative.   Neurological: Negative.   Psychiatric/Behavioral: Negative.   All other systems reviewed and are negative.   BP 144/80  Pulse 68  Ht 5\' 8"  (1.727 m)  Wt 172 lb 12 oz (78.359 kg)  BMI 26.27 kg/m2  Physical Exam  Nursing note and vitals reviewed. Constitutional: He is oriented to person, place, and time. He appears well-developed and well-nourished.  HENT:  Head: Normocephalic.  Nose: Nose normal.  Mouth/Throat: Oropharynx is clear and moist.  Eyes: Conjunctivae are normal. Pupils are equal, round, and reactive to light.  Neck: Normal range of motion. Neck supple. No JVD present.  Cardiovascular: Normal rate, regular rhythm, S1 normal, S2 normal, normal heart sounds and intact distal pulses.  Exam  reveals no gallop and no friction rub.   No murmur heard. Pulmonary/Chest: Effort normal and breath sounds normal. No respiratory distress. He has no wheezes. He has no rales. He exhibits no tenderness.  Abdominal: Soft. Bowel sounds are normal. He exhibits no distension. There is no tenderness.  Musculoskeletal: Normal range of motion. He exhibits no edema and no tenderness.  Lymphadenopathy:    He has no cervical adenopathy.  Neurological: He is alert and oriented to person, place, and time. Coordination normal.  Skin: Skin is warm and dry. No rash noted. No erythema.   Psychiatric: He has a normal mood and affect. His behavior is normal. Judgment and thought content normal.      Assessment and Plan

## 2013-12-31 NOTE — Assessment & Plan Note (Signed)
He does report some palpitations that present on an occasional basis commonly at nighttime. This is suggestive of ectopy rather than atrial fibrillation. Suggested he contact us if symptoms get worse and Holter monitor could be performed

## 2013-12-31 NOTE — Assessment & Plan Note (Signed)
No significant murmur appreciated on exam today. No further testing

## 2013-12-31 NOTE — Assessment & Plan Note (Signed)
We have suggested he closely monitor his blood pressure. Extra lisinopril has been called into the pharmacy if needed. Could also take extra propranolol, extra Hytrin for blood pressure control

## 2014-05-06 DIAGNOSIS — Z8619 Personal history of other infectious and parasitic diseases: Secondary | ICD-10-CM | POA: Insufficient documentation

## 2014-07-19 ENCOUNTER — Emergency Department: Payer: Self-pay | Admitting: Emergency Medicine

## 2014-08-22 LAB — CBC AND DIFFERENTIAL
HEMATOCRIT: 47 % (ref 41–53)
Hemoglobin: 15.9 g/dL (ref 13.5–17.5)
NEUTROS ABS: 2 /uL
Platelets: 179 10*3/uL (ref 150–399)
WBC: 5.1 10^3/mL

## 2014-08-22 LAB — BASIC METABOLIC PANEL
BUN: 23 mg/dL — AB (ref 4–21)
CREATININE: 1.6 mg/dL — AB (ref 0.6–1.3)
Glucose: 100 mg/dL
POTASSIUM: 5.2 mmol/L (ref 3.4–5.3)
SODIUM: 137 mmol/L (ref 137–147)

## 2014-08-22 LAB — HEPATIC FUNCTION PANEL
ALT: 21 U/L (ref 10–40)
AST: 20 U/L (ref 14–40)
Alkaline Phosphatase: 60 U/L (ref 25–125)

## 2014-08-22 LAB — LIPID PANEL
Cholesterol: 122 mg/dL (ref 0–200)
HDL: 56 mg/dL (ref 35–70)
LDL Cholesterol: 56 mg/dL
LDL/HDL RATIO: 1
TRIGLYCERIDES: 50 mg/dL (ref 40–160)

## 2014-08-22 LAB — PSA: PSA: 5.8

## 2014-08-22 LAB — TSH: TSH: 1.77 u[IU]/mL (ref 0.41–5.90)

## 2014-11-16 ENCOUNTER — Other Ambulatory Visit: Payer: Self-pay | Admitting: Family Medicine

## 2014-11-16 ENCOUNTER — Telehealth: Payer: Self-pay | Admitting: Family Medicine

## 2014-11-21 DIAGNOSIS — N401 Enlarged prostate with lower urinary tract symptoms: Secondary | ICD-10-CM

## 2014-11-21 DIAGNOSIS — Z85528 Personal history of other malignant neoplasm of kidney: Secondary | ICD-10-CM | POA: Insufficient documentation

## 2014-11-21 DIAGNOSIS — N138 Other obstructive and reflux uropathy: Secondary | ICD-10-CM | POA: Insufficient documentation

## 2014-11-23 ENCOUNTER — Telehealth: Payer: Self-pay | Admitting: Family Medicine

## 2014-11-23 NOTE — Telephone Encounter (Signed)
Pt is requesting to pick up his last lab results.    Pt also states the pharmacy is stating the Rx ANDROGEL PUMP 20.25 MG/ACT (1.62%) GEL was denied for prior authorization so pt is asking if a Rx for Testim would be covered and would this be less expensive?  HJ#[phone]/[id number]

## 2014-11-24 NOTE — Telephone Encounter (Signed)
Called pharmacist and requested PA form for Androgel-aa

## 2014-11-28 NOTE — Telephone Encounter (Signed)
PA completed today and sent to insurance, awaiting response-aa

## 2014-11-30 NOTE — Telephone Encounter (Signed)
Insurance approved patient's Androgel-pt and pharmacy advised-aa

## 2015-01-11 ENCOUNTER — Ambulatory Visit: Payer: Self-pay | Admitting: Cardiovascular Disease

## 2015-01-13 ENCOUNTER — Ambulatory Visit: Payer: Self-pay | Admitting: Cardiovascular Disease

## 2015-01-17 ENCOUNTER — Encounter: Payer: Self-pay | Admitting: Anesthesiology

## 2015-01-17 ENCOUNTER — Ambulatory Visit: Payer: Medicare Other | Attending: Anesthesiology | Admitting: Anesthesiology

## 2015-01-17 VITALS — BP 166/89 | HR 62 | Temp 97.8°F | Resp 16 | Ht 68.0 in | Wt 165.0 lb

## 2015-01-17 DIAGNOSIS — G545 Neuralgic amyotrophy: Secondary | ICD-10-CM | POA: Insufficient documentation

## 2015-01-17 DIAGNOSIS — M5136 Other intervertebral disc degeneration, lumbar region: Secondary | ICD-10-CM

## 2015-01-17 DIAGNOSIS — G8929 Other chronic pain: Secondary | ICD-10-CM | POA: Insufficient documentation

## 2015-01-17 DIAGNOSIS — M5030 Other cervical disc degeneration, unspecified cervical region: Secondary | ICD-10-CM | POA: Diagnosis not present

## 2015-01-17 DIAGNOSIS — M545 Low back pain: Secondary | ICD-10-CM | POA: Diagnosis present

## 2015-01-17 DIAGNOSIS — M5432 Sciatica, left side: Secondary | ICD-10-CM

## 2015-01-17 DIAGNOSIS — M5117 Intervertebral disc disorders with radiculopathy, lumbosacral region: Secondary | ICD-10-CM | POA: Insufficient documentation

## 2015-01-17 DIAGNOSIS — M1288 Other specific arthropathies, not elsewhere classified, other specified site: Secondary | ICD-10-CM | POA: Insufficient documentation

## 2015-01-17 NOTE — Progress Notes (Signed)
,  pms

## 2015-01-17 NOTE — Progress Notes (Signed)
Safety precautions to be maintained throughout the outpatient stay will include: orient to surroundings, keep bed in low position, maintain call bell within reach at all times, provide assistance with transfer out of bed and ambulation.  

## 2015-01-17 NOTE — Patient Instructions (Signed)
Pain Management Discharge Instructions  General Discharge Instructions :  If you need to reach your doctor call: Monday-Friday 8:00 am - 4:00 pm at [phone] or toll free [phone].  After clinic hours [phone] to have operator reach doctor.  Bring all of your medication bottles to all your appointments in the pain clinic.  To cancel or reschedule your appointment with Pain Management please remember to call 24 hours in advance to avoid a fee.  Refer to the educational materials which you have been given on: General Risks, I had my Procedure. Discharge Instructions, Post Sedation.  Post Procedure Instructions:  The drugs you were given will stay in your system until tomorrow, so for the next 24 hours you should not drive, make any legal decisions or drink any alcoholic beverages.  You may eat anything you prefer, but it is better to start with liquids then soups and crackers, and gradually work up to solid foods.  Please notify your doctor immediately if you have any unusual bleeding, trouble breathing or pain that is not related to your normal pain.  Depending on the type of procedure that was done, some parts of your body may feel week and/or numb.  This usually clears up by tonight or the next day.  Walk with the use of an assistive device or accompanied by an adult for the 24 hours.  You may use ice on the affected area for the first 24 hours.  Put ice in a Ziploc bag and cover with a towel and place against area 15 minutes on 15 minutes off.  You may switch to heat after 24 hours.Epidural Steroid Injection Patient Information  Description: The epidural space surrounds the nerves as they exit the spinal cord.  In some patients, the nerves can be compressed and inflamed by a bulging disc or a tight spinal canal (spinal stenosis).  By injecting steroids into the epidural space, we can bring irritated nerves into direct contact with a potentially helpful medication.  These  steroids act directly on the irritated nerves and can reduce swelling and inflammation which often leads to decreased pain.  Epidural steroids may be injected anywhere along the spine and from the neck to the low back depending upon the location of your pain.   After numbing the skin with local anesthetic (like Novocaine), a small needle is passed into the epidural space slowly.  You may experience a sensation of pressure while this is being done.  The entire block usually last less than 10 minutes.  Conditions which may be treated by epidural steroids:   Low back and leg pain  Neck and arm pain  Spinal stenosis  Post-laminectomy syndrome  Herpes zoster (shingles) pain  Pain from compression fractures  Preparation for the injection:  1. Do not eat any solid food or dairy products within 6 hours of your appointment.  2. You may drink clear liquids up to 2 hours before appointment.  Clear liquids include water, black coffee, juice or soda.  No milk or cream please. 3. You may take your regular medication, including pain medications, with a sip of water before your appointment  Diabetics should hold regular insulin (if taken separately) and take 1/2 normal NPH dos the morning of the procedure.  Carry some sugar containing items with you to your appointment. 4. A driver must accompany you and be prepared to drive you home after your procedure.  5. Bring all your current medications with your. 6. An IV may be inserted and   sedation may be given at the discretion of the physician.   7. A blood pressure cuff, EKG and other monitors will often be applied during the procedure.  Some patients may need to have extra oxygen administered for a short period. 8. You will be asked to provide medical information, including your allergies, prior to the procedure.  We must know immediately if you are taking blood thinners (like Coumadin/Warfarin)  Or if you are allergic to IV iodine contrast (dye). We must  know if you could possible be pregnant.  Possible side-effects:  Bleeding from needle site  Infection (rare, may require surgery)  Nerve injury (rare)  Numbness & tingling (temporary)  Difficulty urinating (rare, temporary)  Spinal headache ( a headache worse with upright posture)  Light -headedness (temporary)  Pain at injection site (several days)  Decreased blood pressure (temporary)  Weakness in arm/leg (temporary)  Pressure sensation in back/neck (temporary)  Call if you experience:  Fever/chills associated with headache or increased back/neck pain.  Headache worsened by an upright position.  New onset weakness or numbness of an extremity below the injection site  Hives or difficulty breathing (go to the emergency room)  Inflammation or drainage at the infection site  Severe back/neck pain  Any new symptoms which are concerning to you  Please note:  Although the local anesthetic injected can often make your back or neck feel good for several hours after the injection, the pain will likely return.  It takes 3-7 days for steroids to work in the epidural space.  You may not notice any pain relief for at least that one week.  If effective, we will often do a series of three injections spaced 3-6 weeks apart to maximally decrease your pain.  After the initial series, we generally will wait several months before considering a repeat injection of the same type.  If you have any questions, please call [phone] [facility] 

## 2015-01-18 ENCOUNTER — Telehealth: Payer: Self-pay | Admitting: *Deleted

## 2015-01-18 NOTE — Telephone Encounter (Signed)
Left voice mail

## 2015-01-19 NOTE — Progress Notes (Signed)
PROCEDURE PERFORMED: Lumbar epidural steroid injection under fluroscopic guidance with moderate sedation. L5-S1  CC:  Low back pain with radiation into the bilateral buttocks and down the posterior bilateral legs  HPI:  Thomas [name] presents for reevaluation. He hasn't been seen for some time and has had previous epidural steroids for his chronic low back pain. He's gone several years without any significant recurrence and did previously very well with past injections. He presents today with recurrence of low back pain it's radiating into his bilateral buttocks down the posterior lateral legs occasionally to the feet with some associated numbness and tingling. Occasionally has some calf cramping. Bowel and bladder function is been stable and no other problems of the noted. He desires to proceed with a repeat epidural injection today.  Physical Exam:    PERRL, EOMI  Heart RRR   LCTA  Musculoskeletal: Bilateral paraspinous muscle tenderness in the low back. He has some mild pain on extension with left and right lateral rotation strength appears to be well preserved throughout the lower extremities and intact light touch sensation. It is a mildly antalgic gait but otherwise appears normal.   Assessment: Chronic low back pain with degenerative disc disease and bilateral L5 sciatica  #2 facet arthropathy  #3 myofascial low back pain  #4 cervicalgia with cervical degenerative disc disease history  PLAN:   1. We'll proceed with a series of epidural steroids as requested by the patient. He's done well with these in the past and that would be indicated at this time  2.  The patient is to return for reevaluation in approximately one month. They have been instructed to continue follow-up with their primary care physician regarding their baseline medical care. We'll plan on a repeat epidural steroids injection at that time and he is to continue with stretching strengthening exercises.     Procedure:   LESI:  NOTE: The risks, benefits, and expectations of the procedure have been discussed and explained to the patient who was understanding and in agreement with suggested treatment plan. No guarantees were made.  DESCRIPTION OF PROCEDURE: Lumbar epidural steroid injection with no Versed per patient request, EKG, blood pressure, pulse, and pulse oximetry monitoring. The procedure was performed with the patient in the prone position under fluoroscopic guidance. A local anesthetic skin wheal of 1.5% plain lidocaine was performed at the L5-S1 site after fluoroscopic identifictation  Using strict aseptic technique, I then advanced an 18-gauge Tuohy epidural needle in the midline via loss-of-resistance  Technique. There was negative aspiration for negative aspiration for heme or  CSF.  I then confirmed position with both AP and Lateral fluoroscan.  A total of 5 mL of Preservative-Free normal saline with 40 mg of Kenalog and 1cc Ropicaine 0.2 percent was injected incrementally via the  epidurally placed needle. Needle removed. The patient tolerated the injection well.   @James  Andree Elk, MD@

## 2015-01-24 ENCOUNTER — Other Ambulatory Visit: Payer: Self-pay | Admitting: *Deleted

## 2015-01-24 MED ORDER — PROPRANOLOL HCL 40 MG PO TABS: 40.0000 mg | ORAL_TABLET | Freq: Three times a day (TID) | ORAL | Status: DC | PRN

## 2015-01-24 MED ORDER — ATORVASTATIN CALCIUM 10 MG PO TABS: ORAL_TABLET | ORAL | Status: DC

## 2015-01-24 MED ORDER — LISINOPRIL 5 MG PO TABS: 5.0000 mg | ORAL_TABLET | Freq: Two times a day (BID) | ORAL | Status: DC

## 2015-02-06 DIAGNOSIS — G4700 Insomnia, unspecified: Secondary | ICD-10-CM | POA: Insufficient documentation

## 2015-02-06 DIAGNOSIS — K219 Gastro-esophageal reflux disease without esophagitis: Secondary | ICD-10-CM | POA: Insufficient documentation

## 2015-02-06 DIAGNOSIS — M1990 Unspecified osteoarthritis, unspecified site: Secondary | ICD-10-CM | POA: Insufficient documentation

## 2015-02-06 DIAGNOSIS — C649 Malignant neoplasm of unspecified kidney, except renal pelvis: Secondary | ICD-10-CM | POA: Insufficient documentation

## 2015-02-06 DIAGNOSIS — M109 Gout, unspecified: Secondary | ICD-10-CM | POA: Insufficient documentation

## 2015-02-07 ENCOUNTER — Ambulatory Visit (INDEPENDENT_AMBULATORY_CARE_PROVIDER_SITE_OTHER): Payer: Medicare Other | Admitting: Family Medicine

## 2015-02-07 VITALS — BP 138/80 | HR 78 | Temp 97.5°F | Resp 16 | Wt 171.0 lb

## 2015-02-07 DIAGNOSIS — Z23 Encounter for immunization: Secondary | ICD-10-CM

## 2015-02-07 DIAGNOSIS — I10 Essential (primary) hypertension: Secondary | ICD-10-CM

## 2015-02-07 DIAGNOSIS — E785 Hyperlipidemia, unspecified: Secondary | ICD-10-CM | POA: Diagnosis not present

## 2015-02-07 DIAGNOSIS — E291 Testicular hypofunction: Secondary | ICD-10-CM

## 2015-02-07 MED ORDER — TESTOSTERONE 20.25 MG/ACT (1.62%) TD GEL: TRANSDERMAL | Status: DC

## 2015-02-07 NOTE — Progress Notes (Signed)
Patient ID: Thomas [name], male   DOB: [birth year], 81 y.o.   MRN: [id number]    Subjective:  HPI  Hypertension, follow-up:  BP Readings from Last 3 Encounters:  02/07/15 138/80  08/10/14 132/76  01/17/15 166/89    He was last seen for hypertension 6 months ago.  BP at that visit was 166/89. Management since that visit includes none. He reports good compliance with treatment. He is not having side effects.  He is exercising. Outside blood pressures are 130's-150's/ 70's-80's..      Wt Readings from Last 3 Encounters:  02/07/15 171 lb (77.565 kg)  08/10/14 179 lb (81.194 kg)  01/17/15 165 lb (74.844 kg)    ------------------------------------------------------------------------  Pt has had some night sweats, has had these in the past and thinks it is related to his excessive sweating in general but wanted to mention it.    Prior to Admission medications   Medication Sig Start Date End Date Taking? Authorizing Provider  ANDROGEL PUMP 20.25 MG/ACT (1.62%) GEL USE TWO PUMPS ONCE A DAY 11/17/14  Yes Cheyanna Strick Maceo Pro., MD  aspirin 81 MG tablet Take by mouth. 07/24/12  Yes Historical Provider, MD  atorvastatin (LIPITOR) 10 MG tablet TAKE ONE (1) TABLET EACH DAY 01/24/15  Yes Minna Merritts, MD  COLCRYS 0.6 MG tablet Take 0.6 mg by mouth as needed.  10/07/12  Yes Historical Provider, MD  diclofenac sodium (VOLTAREN) 1 % GEL Place onto the skin. 01/20/14  Yes Historical Provider, MD  dutasteride (AVODART) 0.5 MG capsule Take 1 mg by mouth daily.    Yes Historical Provider, MD  esomeprazole (NEXIUM) 40 MG capsule Take 40 mg by mouth daily as needed.    Yes Historical Provider, MD  febuxostat (ULORIC) 40 MG tablet Take 10 mg by mouth daily.   Yes Historical Provider, MD  furosemide (LASIX) 20 MG tablet Take 1 tablet (20 mg total) by mouth daily as needed. 12/31/13  Yes Minna Merritts, MD  lisinopril (PRINIVIL,ZESTRIL) 5 MG tablet Take 1 tablet (5 mg total) by mouth 2 (two) times  daily. 01/24/15  Yes Minna Merritts, MD  MULTIPLE VITAMIN PO Take by mouth. 07/24/12  Yes Historical Provider, MD  Omega-3 Fatty Acids (FISH OIL) 1000 MG CAPS Take 1,000 mg by mouth 1 dose over 24 hours.     Yes Historical Provider, MD  propranolol (INDERAL) 40 MG tablet Take 1 tablet (40 mg total) by mouth 3 (three) times daily as needed. 01/24/15  Yes Minna Merritts, MD  silodosin (RAPAFLO) 8 MG CAPS capsule Take by mouth. 07/24/12  Yes Historical Provider, MD  temazepam (RESTORIL) 30 MG capsule Take by mouth. 01/20/14  Yes Historical Provider, MD  terazosin (HYTRIN) 1 MG capsule Take 1 mg by mouth at bedtime.     Yes Historical Provider, MD  vitamin E 400 UNIT capsule Take 400 Units by mouth daily.   Yes Historical Provider, MD  amLODipine (NORVASC) 5 MG tablet Take by mouth. 07/24/12   Historical Provider, MD    Patient Active Problem List   Diagnosis Date Noted  . Arthritis 02/06/2015  . Esophageal reflux 02/06/2015  . Arthritis urica 02/06/2015  . Cannot sleep 02/06/2015  . Arthritis, degenerative 02/06/2015  . Adenocarcinoma, renal cell ([place]) 02/06/2015  . Benign prostatic hyperplasia with urinary obstruction 11/21/2014  . Personal history of other malignant neoplasm of kidney 11/21/2014  . Palpitations 12/31/2013  . Hyperlipidemia 11/02/2010  . HYPERTENSION, BENIGN 04/16/2010  . AORTIC STENOSIS/ INSUFFICIENCY, NON-RHEUMATIC  04/16/2010    Past Medical History  Diagnosis Date  . Hypertension   . Aortic valve disorders   . Hyperlipidemia   . PVC's (premature ventricular contractions)     Social History   Social History  . Marital Status: Widowed    Spouse Name: N/A  . Number of Children: N/A  . Years of Education: N/A   Occupational History  . Not on file.   Social History Main Topics  . Smoking status: Never Smoker   . Smokeless tobacco: Not on file  . Alcohol Use: 1.2 oz/week    1 Cans of beer, 1 Glasses of wine per week  . Drug Use: No  . Sexual Activity: Not  on file   Other Topics Concern  . Not on file   Social History Narrative    Allergies  Allergen Reactions  . Iodine     IVP contrast  . Penicillins     Review of Systems  Constitutional: Positive for diaphoresis (at night).  HENT: Negative.   Eyes: Negative.   Respiratory: Negative.   Cardiovascular: Negative.   Gastrointestinal: Negative.   Genitourinary: Negative.   Musculoskeletal: Negative.   Skin: Negative.   Neurological: Negative.   Endo/Heme/Allergies: Negative.   Psychiatric/Behavioral: Negative.      There is no immunization history on file for this patient. Objective:  BP 138/80 mmHg  Pulse 78  Temp(Src) 97.5 F (36.4 C) (Oral)  Resp 16  Wt 171 lb (77.565 kg)  Physical Exam  Constitutional: He is oriented to person, place, and time and well-developed, well-nourished, and in no distress.  HENT:  Head: Normocephalic and atraumatic.  Right Ear: External ear normal.  Left Ear: External ear normal.  Nose: Nose normal.  Eyes: Conjunctivae are normal.  Neck: Neck supple.  Cardiovascular: Normal rate, regular rhythm and normal heart sounds.   Pulmonary/Chest: Effort normal and breath sounds normal.  Abdominal: Soft.  Neurological: He is alert and oriented to person, place, and time.  Skin: Skin is warm and dry.  Psychiatric: Mood, memory, affect and judgment normal.    Lab Results  Component Value Date   WBC 5.1 08/22/2014   HGB 15.9 08/22/2014   HCT 47 08/22/2014   PLT 179 08/22/2014   GLUCOSE 105* 06/26/2012   CHOL 122 08/22/2014   TRIG 50 08/22/2014   HDL 56 08/22/2014   LDLCALC 56 08/22/2014   TSH 1.77 08/22/2014   PSA 5.8 08/22/2014    CMP     Component Value Date/Time   NA 137 08/22/2014   NA 138 06/26/2012 1106   NA 135 05/08/2010 2142   K 5.2 08/22/2014   K 4.4 06/26/2012 1106   CL 105 06/26/2012 1106   CL 101 03/03/2012 0824   CO2 29 06/26/2012 1106   CO2 26 03/03/2012 0824   GLUCOSE 105* 06/26/2012 1106   GLUCOSE 109*  03/03/2012 0824   GLUCOSE 92 05/08/2010 2142   BUN 23* 08/22/2014   BUN 16 06/26/2012 1106   BUN 18 05/08/2010 2142   CREATININE 1.6* 08/22/2014   CREATININE 1.31* 06/26/2012 1106   CREATININE 1.71* 03/03/2012 0824   CALCIUM 8.9 06/26/2012 1106   CALCIUM 9.6 03/03/2012 0824   PROT 7.5 06/26/2012 1106   ALBUMIN 3.8 06/26/2012 1106   AST 20 08/22/2014   AST 23 06/26/2012 1106   ALT 21 08/22/2014   ALT 24 06/26/2012 1106   ALKPHOS 60 08/22/2014   ALKPHOS 68 06/26/2012 1106   BILITOT 0.6 06/26/2012 1106  GFRNONAA 52* 06/26/2012 1106   GFRNONAA 38* 03/03/2012 0824   GFRAA >60 06/26/2012 1106   GFRAA 44* 03/03/2012 0824    Assessment and Plan :  1. HYPERTENSION, BENIGN Patient having hot flashes and episodes of mild tachycardia. He feels he is having symptoms of pheochromocytoma. Retired Engineer, drilling. - Metanephrines, urine, 24 hour; Future Taking lisinopril at half a pill daily 2. Hyperlipidemia   3. Hypogonadism male  - Testosterone (ANDROGEL PUMP) 20.25 MG/ACT (1.62%) GEL; 2 pumps daily  Dispense: 225 g; Refill: 5  4. Need for influenza vaccination  - Flu vaccine HIGH DOSE PF 5. chronic back pain Patient had recent injection of his LS-spine by Dr. Ned Grace MD [facility] 02/07/2015 11:00 AM

## 2015-02-09 ENCOUNTER — Other Ambulatory Visit: Payer: Self-pay | Admitting: Family Medicine

## 2015-02-12 LAB — METANEPHRINES, URINE, 24 HOUR
METANEPH TOTAL UR: 47 ug/L
Metanephrines, 24H Ur: 141 ug/24 hr (ref 45–290)
NORMETANEPHRINE 24H UR: 315 ug/(24.h) (ref 82–500)
Normetanephrine, Ur: 105 ug/L

## 2015-02-15 ENCOUNTER — Encounter: Payer: Self-pay | Admitting: Anesthesiology

## 2015-02-15 ENCOUNTER — Ambulatory Visit: Payer: Medicare Other | Attending: Anesthesiology | Admitting: Anesthesiology

## 2015-02-15 VITALS — BP 197/104 | HR 67 | Temp 97.7°F | Resp 15 | Ht 68.0 in | Wt 165.0 lb

## 2015-02-15 DIAGNOSIS — M1288 Other specific arthropathies, not elsewhere classified, other specified site: Secondary | ICD-10-CM | POA: Diagnosis not present

## 2015-02-15 DIAGNOSIS — M5030 Other cervical disc degeneration, unspecified cervical region: Secondary | ICD-10-CM | POA: Insufficient documentation

## 2015-02-15 DIAGNOSIS — G8929 Other chronic pain: Secondary | ICD-10-CM | POA: Insufficient documentation

## 2015-02-15 DIAGNOSIS — M79604 Pain in right leg: Secondary | ICD-10-CM | POA: Diagnosis present

## 2015-02-15 DIAGNOSIS — M5432 Sciatica, left side: Secondary | ICD-10-CM

## 2015-02-15 DIAGNOSIS — M545 Low back pain: Secondary | ICD-10-CM | POA: Diagnosis present

## 2015-02-15 DIAGNOSIS — M542 Cervicalgia: Secondary | ICD-10-CM | POA: Diagnosis not present

## 2015-02-15 DIAGNOSIS — M5136 Other intervertebral disc degeneration, lumbar region: Secondary | ICD-10-CM | POA: Diagnosis not present

## 2015-02-15 DIAGNOSIS — M79605 Pain in left leg: Secondary | ICD-10-CM | POA: Diagnosis present

## 2015-02-15 MED ORDER — TRIAMCINOLONE ACETONIDE 40 MG/ML IJ SUSP
INTRAMUSCULAR | Status: AC
  Administered 2015-02-15: 15:00:00
  Filled 2015-02-15: qty 1

## 2015-02-15 MED ORDER — SODIUM CHLORIDE 0.9 % IJ SOLN
INTRAMUSCULAR | Status: AC
Start: 2015-02-15 — End: 2015-02-15
  Administered 2015-02-15: 14:00:00
  Filled 2015-02-15: qty 10

## 2015-02-15 MED ORDER — ROPIVACAINE HCL 2 MG/ML IJ SOLN
INTRAMUSCULAR | Status: AC
  Administered 2015-02-15: 14:00:00
  Filled 2015-02-15: qty 10

## 2015-02-15 MED ORDER — LIDOCAINE HCL (PF) 1 % IJ SOLN
INTRAMUSCULAR | Status: AC
  Administered 2015-02-15: 14:00:00
  Filled 2015-02-15: qty 5

## 2015-02-15 NOTE — Patient Instructions (Signed)
Epidural Steroid Injection Patient Information  Description: The epidural space surrounds the nerves as they exit the spinal cord.  In some patients, the nerves can be compressed and inflamed by a bulging disc or a tight spinal canal (spinal stenosis).  By injecting steroids into the epidural space, we can bring irritated nerves into direct contact with a potentially helpful medication.  These steroids act directly on the irritated nerves and can reduce swelling and inflammation which often leads to decreased pain.  Epidural steroids may be injected anywhere along the spine and from the neck to the low back depending upon the location of your pain.   After numbing the skin with local anesthetic (like Novocaine), a small needle is passed into the epidural space slowly.  You may experience a sensation of pressure while this is being done.  The entire block usually last less than 10 minutes.  Conditions which may be treated by epidural steroids:   Low back and leg pain  Neck and arm pain  Spinal stenosis  Post-laminectomy syndrome  Herpes zoster (shingles) pain  Pain from compression fractures  Preparation for the injection:  1. Do not eat any solid food or dairy products within 6 hours of your appointment.  2. You may drink clear liquids up to 2 hours before appointment.  Clear liquids include water, black coffee, juice or soda.  No milk or cream please. 3. You may take your regular medication, including pain medications, with a sip of water before your appointment  Diabetics should hold regular insulin (if taken separately) and take 1/2 normal NPH dos the morning of the procedure.  Carry some sugar containing items with you to your appointment. 4. A driver must accompany you and be prepared to drive you home after your procedure.  5. Bring all your current medications with your. 6. An IV may be inserted and sedation may be given at the discretion of the physician.   7. A blood pressure  cuff, EKG and other monitors will often be applied during the procedure.  Some patients may need to have extra oxygen administered for a short period. 8. You will be asked to provide medical information, including your allergies, prior to the procedure.  We must know immediately if you are taking blood thinners (like Coumadin/Warfarin)  Or if you are allergic to IV iodine contrast (dye). We must know if you could possible be pregnant.  Possible side-effects:  Bleeding from needle site  Infection (rare, may require surgery)  Nerve injury (rare)  Numbness & tingling (temporary)  Difficulty urinating (rare, temporary)  Spinal headache ( a headache worse with upright posture)  Light -headedness (temporary)  Pain at injection site (several days)  Decreased blood pressure (temporary)  Weakness in arm/leg (temporary)  Pressure sensation in back/neck (temporary)  Call if you experience:  Fever/chills associated with headache or increased back/neck pain.  Headache worsened by an upright position.  New onset weakness or numbness of an extremity below the injection site  Hives or difficulty breathing (go to the emergency room)  Inflammation or drainage at the infection site  Severe back/neck pain  Any new symptoms which are concerning to you  Please note:  Although the local anesthetic injected can often make your back or neck feel good for several hours after the injection, the pain will likely return.  It takes 3-7 days for steroids to work in the epidural space.  You may not notice any pain relief for at least that one week.    If effective, we will often do a series of three injections spaced 3-6 weeks apart to maximally decrease your pain.  After the initial series, we generally will wait several months before considering a repeat injection of the same type.  If you have any questions, please call [phone] [facility]  Discharge Instructions  General Discharge Instructions :  If you need to reach your doctor call: Monday-Friday 8:00 am - 4:00 pm at [phone] or toll free [phone].  After clinic hours [phone] to have operator reach doctor.  Bring all of your medication bottles to all your appointments in the pain clinic.  To cancel or reschedule your appointment with Pain Management please remember to call 24 hours in advance to avoid a fee.  Refer to the educational materials which you have been given on: General Risks, I had my Procedure. Discharge Instructions, Post Sedation.  Post Procedure Instructions:  The drugs you were given will stay in your system until tomorrow, so for the next 24 hours you should not drive, make any legal decisions or drink any alcoholic beverages.  You may eat anything you prefer, but it is better to start with liquids then soups and crackers, and gradually work up to solid foods.  Please notify your doctor immediately if you have any unusual bleeding, trouble breathing or pain that is not related to your normal pain.  Depending on the type of procedure that was done, some parts of your body may feel week and/or numb.  This usually clears up by tonight or the next day.  Walk with the use of an assistive device or accompanied by an adult for the 24 hours.  You may use ice on the affected area for the first 24 hours.  Put ice in a Ziploc bag and cover with a towel and place against area 15 minutes on 15 minutes off.  You may switch to heat after 24 hours.GENERAL RISKS AND COMPLICATIONS  What are the risk, side effects and possible complications? Generally speaking, most procedures are safe.  However, with any procedure there are risks, side effects, and the possibility of complications.  The risks and complications are dependent upon the sites that are lesioned, or the type of nerve block to be performed.  The closer the procedure is to the spine, the more  serious the risks are.  Great care is taken when placing the radio frequency needles, block needles or lesioning probes, but sometimes complications can occur. 1. Infection: Any time there is an injection through the skin, there is a risk of infection.  This is why sterile conditions are used for these blocks.  There are four possible types of infection. 1. Localized skin infection. 2. Central Nervous System Infection-This can be in the form of Meningitis, which can be deadly. 3. Epidural Infections-This can be in the form of an epidural abscess, which can cause pressure inside of the spine, causing compression of the spinal cord with subsequent paralysis. This would require an emergency surgery to decompress, and there are no guarantees that the patient would recover from the paralysis. 4. Discitis-This is an infection of the intervertebral discs.  It occurs in about 1% of discography procedures.  It is difficult to treat and it may lead to surgery.        2. Pain: the needles have to go through skin and soft tissues, will cause soreness.       3. Damage to internal structures:  The nerves  to be lesioned may be near blood vessels or    other nerves which can be potentially damaged.       4. Bleeding: Bleeding is more common if the patient is taking blood thinners such as  aspirin, Coumadin, Ticiid, Plavix, etc., or if he/she have some genetic predisposition  such as hemophilia. Bleeding into the spinal canal can cause compression of the spinal  cord with subsequent paralysis.  This would require an emergency surgery to  decompress and there are no guarantees that the patient would recover from the  paralysis.       5. Pneumothorax:  Puncturing of a lung is a possibility, every time a needle is introduced in  the area of the chest or upper back.  Pneumothorax refers to free air around the  collapsed lung(s), inside of the thoracic cavity (chest cavity).  Another two possible  complications related to a  similar event would include: Hemothorax and Chylothorax.   These are variations of the Pneumothorax, where instead of air around the collapsed  lung(s), you may have blood or chyle, respectively.       6. Spinal headaches: They may occur with any procedures in the area of the spine.       7. Persistent CSF (Cerebro-Spinal Fluid) leakage: This is a rare problem, but may occur  with prolonged intrathecal or epidural catheters either due to the formation of a fistulous  track or a dural tear.       8. Nerve damage: By working so close to the spinal cord, there is always a possibility of  nerve damage, which could be as serious as a permanent spinal cord injury with  paralysis.       9. Death:  Although rare, severe deadly allergic reactions known as "Anaphylactic  reaction" can occur to any of the medications used.      10. Worsening of the symptoms:  We can always make thing worse.  What are the chances of something like this happening? Chances of any of this occuring are extremely low.  By statistics, you have more of a chance of getting killed in a motor vehicle accident: while driving to the hospital than any of the above occurring .  Nevertheless, you should be aware that they are possibilities.  In general, it is similar to taking a shower.  Everybody knows that you can slip, hit your head and get killed.  Does that mean that you should not shower again?  Nevertheless always keep in mind that statistics do not mean anything if you happen to be on the wrong side of them.  Even if a procedure has a 1 (one) in a 1,000,000 (million) chance of going wrong, it you happen to be that one..Also, keep in mind that by statistics, you have more of a chance of having something go wrong when taking medications.  Who should not have this procedure? If you are on a blood thinning medication (e.g. Coumadin, Plavix, see list of "Blood Thinners"), or if you have an active infection going on, you should not have the  procedure.  If you are taking any blood thinners, please inform your physician.  How should I prepare for this procedure?  Do not eat or drink anything at least six hours prior to the procedure.  Bring a driver with you .  It cannot be a taxi.  Come accompanied by an adult that can drive you back, and that is strong enough to help you if  your legs get weak or numb from the local anesthetic.  Take all of your medicines the morning of the procedure with just enough water to swallow them.  If you have diabetes, make sure that you are scheduled to have your procedure done first thing in the morning, whenever possible.  If you have diabetes, take only half of your insulin dose and notify our nurse that you have done so as soon as you arrive at the clinic.  If you are diabetic, but only take blood sugar pills (oral hypoglycemic), then do not take them on the morning of your procedure.  You may take them after you have had the procedure.  Do not take aspirin or any aspirin-containing medications, at least eleven (11) days prior to the procedure.  They may prolong bleeding.  Wear loose fitting clothing that may be easy to take off and that you would not mind if it got stained with Betadine or blood.  Do not wear any jewelry or perfume  Remove any nail coloring.  It will interfere with some of our monitoring equipment.  NOTE: Remember that this is not meant to be interpreted as a complete list of all possible complications.  Unforeseen problems may occur.  BLOOD THINNERS The following drugs contain aspirin or other products, which can cause increased bleeding during surgery and should not be taken for 2 weeks prior to and 1 week after surgery.  If you should need take something for relief of minor pain, you may take acetaminophen which is found in Tylenol,m Datril, Anacin-3 and Panadol. It is not blood thinner. The products listed below are.  Do not take any of the products listed below in  addition to any listed on your instruction sheet.  A.P.C or A.P.C with Codeine Codeine Phosphate Capsules #3 Ibuprofen Ridaura  ABC compound Congesprin Imuran rimadil  Advil Cope Indocin Robaxisal  Alka-Seltzer Effervescent Pain Reliever and Antacid Coricidin or Coricidin-D  Indomethacin Rufen  Alka-Seltzer plus Cold Medicine Cosprin Ketoprofen S-A-C Tablets  Anacin Analgesic Tablets or Capsules Coumadin Korlgesic Salflex  Anacin Extra Strength Analgesic tablets or capsules CP-2 Tablets Lanoril Salicylate  Anaprox Cuprimine Capsules Levenox Salocol  Anexsia-D Dalteparin Magan Salsalate  Anodynos Darvon compound Magnesium Salicylate Sine-off  Ansaid Dasin Capsules Magsal Sodium Salicylate  Anturane Depen Capsules Marnal Soma  APF Arthritis pain formula Dewitt's Pills Measurin Stanback  Argesic Dia-Gesic Meclofenamic Sulfinpyrazone  Arthritis Bayer Timed Release Aspirin Diclofenac Meclomen Sulindac  Arthritis pain formula Anacin Dicumarol Medipren Supac  Analgesic (Safety coated) Arthralgen Diffunasal Mefanamic Suprofen  Arthritis Strength Bufferin Dihydrocodeine Mepro Compound Suprol  Arthropan liquid Dopirydamole Methcarbomol with Aspirin Synalgos  ASA tablets/Enseals Disalcid Micrainin Tagament  Ascriptin Doan's Midol Talwin  Ascriptin A/D Dolene Mobidin Tanderil  Ascriptin Extra Strength Dolobid Moblgesic Ticlid  Ascriptin with Codeine Doloprin or Doloprin with Codeine Momentum Tolectin  Asperbuf Duoprin Mono-gesic Trendar  Aspergum Duradyne Motrin or Motrin IB Triminicin  Aspirin plain, buffered or enteric coated Durasal Myochrisine Trigesic  Aspirin Suppositories Easprin Nalfon Trillsate  Aspirin with Codeine Ecotrin Regular or Extra Strength Naprosyn Uracel  Atromid-S Efficin Naproxen Ursinus  Auranofin Capsules Elmiron Neocylate Vanquish  Axotal Emagrin Norgesic Verin  Azathioprine Empirin or Empirin with Codeine Normiflo Vitamin E  Azolid Emprazil Nuprin Voltaren  Bayer  Aspirin plain, buffered or children's or timed BC Tablets or powders Encaprin Orgaran Warfarin Sodium  Buff-a-Comp Enoxaparin Orudis Zorpin  Buff-a-Comp with Codeine Equegesic Os-Cal-Gesic   Buffaprin Excedrin plain, buffered or Extra Strength Oxalid   Bufferin Arthritis   Strength Feldene Oxphenbutazone   Bufferin plain or Extra Strength Feldene Capsules Oxycodone with Aspirin   Bufferin with Codeine Fenoprofen Fenoprofen Pabalate or Pabalate-SF   Buffets II Flogesic Panagesic   Buffinol plain or Extra Strength Florinal or Florinal with Codeine Panwarfarin   Buf-Tabs Flurbiprofen Penicillamine   Butalbital Compound Four-way cold tablets Penicillin   Butazolidin Fragmin Pepto-Bismol   Carbenicillin Geminisyn Percodan   Carna Arthritis Reliever Geopen Persantine   Carprofen Gold's salt Persistin   Chloramphenicol Goody's Phenylbutazone   Chloromycetin Haltrain Piroxlcam   Clmetidine heparin Plaquenil   Cllnoril Hyco-pap Ponstel   Clofibrate Hydroxy chloroquine Propoxyphen         Before stopping any of these medications, be sure to consult the physician who ordered them.  Some, such as Coumadin (Warfarin) are ordered to prevent or treat serious conditions such as "deep thrombosis", "pumonary embolisms", and other heart problems.  The amount of time that you may need off of the medication may also vary with the medication and the reason for which you were taking it.  If you are taking any of these medications, please make sure you notify your pain physician before you undergo any procedures.          

## 2015-02-15 NOTE — Progress Notes (Signed)
PROCEDURE PERFORMED: Lumbar epidural steroid injection under fluroscopic guidance with moderate sedation. L5-S1  CC:  Low back pain with radiation into the bilateral buttocks and down the posterior bilateral legs  HPI:  Thomas [name] presents for reevaluation.He was seen one month ago and had significant improvement in his low back pain and was pain free for 2 weeks with some gradual mild recurrence recently.  He has had previous series epidural steroids for his chronic low back pain. He's gone several years without any significant recurrence and did previously very well with past injections. He presents today with recurrence of some minimal calf cramping but no significant lower extr. Pain.  He states that this his situation is markedly better and desires to proceed with a repeat injection today.    Physical Exam:    PERRL, EOMI  Heart RRR   LCTA  Musculoskeletal: Bilateral paraspinous muscle tenderness in the low back. He has some mild pain on extension with left and right lateral rotation strength appears to be well preserved throughout the lower extremities and intact light touch sensation. It is a mildly antalgic gait but otherwise appears normal.   Assessment: Chronic low back pain with degenerative disc disease and bilateral L5 sciatica  #2 facet arthropathy  #3 myofascial low back pain  #4 cervicalgia with cervical degenerative disc disease history  PLAN:   1. We'll proceed with a 2nd injection of a  series of epidural steroids as requested by the patient. He's done well with these in the past and that would be indicated at this time  2.  The patient is to return for reevaluation in approximately one month. They have been instructed to continue follow-up with their primary care physician regarding their baseline medical care. We'll plan on a repeat epidural steroids injection at that time and he is to continue with stretching strengthening exercises.     Procedure:  LESI:  NOTE:  The risks, benefits, and expectations of the procedure have been discussed and explained to the patient who was understanding and in agreement with suggested treatment plan. No guarantees were made.  DESCRIPTION OF PROCEDURE: Lumbar epidural steroid injection with no Versed per patient request, EKG, blood pressure, pulse, and pulse oximetry monitoring. The procedure was performed with the patient in the prone position under fluoroscopic guidance. A local anesthetic skin wheal of 1.5% plain lidocaine was performed at the L5-S1 site after fluoroscopic identifictation  Using strict aseptic technique, I then advanced an 18-gauge Tuohy epidural needle in the midline via loss-of-resistance  Technique. There was negative aspiration for negative aspiration for heme or  CSF.  I then confirmed position with both AP and Lateral fluoroscan.  A total of 5 mL of Preservative-Free normal saline with 40 mg of Kenalog and 1cc Ropicaine 0.2 percent was injected incrementally via the  epidurally placed needle. Needle removed. The patient tolerated the injection well.   @James  Andree Elk, MD@

## 2015-02-16 ENCOUNTER — Telehealth: Payer: Self-pay | Admitting: *Deleted

## 2015-02-16 NOTE — Telephone Encounter (Signed)
No problems post procedure phone call. 

## 2015-02-28 ENCOUNTER — Encounter: Payer: Self-pay | Admitting: Cardiovascular Disease

## 2015-02-28 ENCOUNTER — Ambulatory Visit (INDEPENDENT_AMBULATORY_CARE_PROVIDER_SITE_OTHER): Payer: Medicare Other | Admitting: Cardiovascular Disease

## 2015-02-28 VITALS — BP 158/80 | HR 104 | Ht 67.0 in | Wt 170.0 lb

## 2015-02-28 DIAGNOSIS — I359 Nonrheumatic aortic valve disorder, unspecified: Secondary | ICD-10-CM

## 2015-02-28 DIAGNOSIS — I441 Atrioventricular block, second degree: Secondary | ICD-10-CM | POA: Insufficient documentation

## 2015-02-28 DIAGNOSIS — E785 Hyperlipidemia, unspecified: Secondary | ICD-10-CM

## 2015-02-28 DIAGNOSIS — I10 Essential (primary) hypertension: Secondary | ICD-10-CM

## 2015-02-28 NOTE — Progress Notes (Signed)
Patient ID: Thomas [name], male    DOB: 08-Aug-1932, 81 y.o.   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 He does have a history of PVCs at nighttime and takes propranolol. Prior history of partial nephrectomy secondary to cancer History of chronic renal insufficiency, baseline creatinine 1.3 up to 1.7  In follow-up today, he reports that he has been running on a regular basis. Denies any shortness of breath on exertion He does have some shortness of breath when he lays on his right side at nighttime, feels better on his left side Typically will run 1.5 up to 2 miles at a time. Feels his conditioning is improving Denies any chest pain concerning for angina Continues to take propranolol before bed for palpitations  EKG on today's visit shows normal sinus rhythm with second-degree block type I   Allergies  Allergen Reactions  . Iodine     IVP contrast  . Penicillins     Current Outpatient Prescriptions on File Prior to Visit  Medication Sig Dispense Refill  . aspirin 81 MG tablet Take by mouth.    Marland Kitchen atorvastatin (LIPITOR) 10 MG tablet TAKE ONE (1) TABLET EACH DAY 90 tablet 0  . COLCRYS 0.6 MG tablet Take 0.6 mg by mouth as needed.     . diclofenac sodium (VOLTAREN) 1 % GEL Place onto the skin.    Marland Kitchen dutasteride (AVODART) 0.5 MG capsule Take 1 mg by mouth as needed.     Marland Kitchen esomeprazole (NEXIUM) 40 MG capsule Take 40 mg by mouth daily as needed.     . febuxostat (ULORIC) 40 MG tablet Take 10 mg by mouth daily.    . furosemide (LASIX) 20 MG tablet Take 1 tablet (20 mg total) by mouth daily as needed. 30 tablet 11  . lisinopril (PRINIVIL,ZESTRIL) 5 MG tablet Take 1 tablet (5 mg total)  by mouth 2 (two) times daily. (Patient taking differently: Take 5 mg by mouth daily. ) 180 tablet 0  . MULTIPLE VITAMIN PO Take by mouth.    . Omega-3 Fatty Acids (FISH OIL) 1000 MG CAPS Take 1,000 mg by mouth 1 dose over 24 hours.      . propranolol (INDERAL) 40 MG tablet Take 1 tablet (40 mg total) by mouth 3 (three) times daily as needed. 270 tablet 0  . silodosin (RAPAFLO) 8 MG CAPS capsule Take by mouth as needed.     . temazepam (RESTORIL) 30 MG capsule Take by mouth at bedtime as needed.     . terazosin (HYTRIN) 1 MG capsule Take 1 mg by mouth at bedtime.      . Testosterone (ANDROGEL PUMP) 20.25 MG/ACT (1.62%) GEL 2 pumps daily 225 g 5  . vitamin E 400 UNIT capsule Take 400 Units by mouth daily.     No current facility-administered medications on file prior to visit.    Past Medical History  Diagnosis Date  . Hypertension   . Aortic valve disorders   . Hyperlipidemia   . PVC's (premature ventricular contractions)     Past Surgical History  Procedure Laterality Date  . Laparoscopic ablation renal mass    . Biopsy prostate  2001 & 2003    x 2  .  Prostate surgery    . Cardiac catheterization  1990's  . Hernia repair  2012  . Ankle surgery      right ankle fracture  . Tonsillectomy and adenoidectomy      Social History  reports that he has never smoked. He does not have any smokeless tobacco history on file. He reports that he drinks about 1.2 oz of alcohol per week. He reports that he does not use illicit drugs.  Family History family history includes Aortic stenosis in his mother; Arthritis in his father; Heart attack in his brother.     Review of Systems  Constitutional: Negative.   HENT: Negative.   Eyes: Negative.   Respiratory: Negative.   Cardiovascular: Positive for palpitations.  Gastrointestinal: Negative.   Musculoskeletal: Negative.   Skin: Negative.   Neurological: Negative.   Psychiatric/Behavioral: Negative.   All other systems reviewed and are  negative.   BP 158/80 mmHg  Pulse 104  Ht 5\' 7"  (1.702 m)  Wt 170 lb (77.111 kg)  BMI 26.62 kg/m2  Physical Exam  Constitutional: He is oriented to person, place, and time. He appears well-developed and well-nourished.  HENT:  Head: Normocephalic.  Nose: Nose normal.  Mouth/Throat: Oropharynx is clear and moist.  Eyes: Conjunctivae are normal. Pupils are equal, round, and reactive to light.  Neck: Normal range of motion. Neck supple. No JVD present.  Cardiovascular: Normal rate, regular rhythm, S1 normal, S2 normal, normal heart sounds and intact distal pulses.  Exam reveals no gallop and no friction rub.   No murmur heard. Pulmonary/Chest: Effort normal and breath sounds normal. No respiratory distress. He has no wheezes. He has no rales. He exhibits no tenderness.  Abdominal: Soft. Bowel sounds are normal. He exhibits no distension. There is no tenderness.  Musculoskeletal: Normal range of motion. He exhibits no edema or tenderness.  Lymphadenopathy:    He has no cervical adenopathy.  Neurological: He is alert and oriented to person, place, and time. Coordination normal.  Skin: Skin is warm and dry. No rash noted. No erythema.  Psychiatric: He has a normal mood and affect. His behavior is normal. Judgment and thought content normal.      Assessment and Plan   Nursing note and vitals reviewed.      Of his hypertension

## 2015-02-28 NOTE — Assessment & Plan Note (Signed)
Tolerating Lipitor 10 mg daily with well-controlled lipid panel

## 2015-02-28 NOTE — Patient Instructions (Signed)
You are doing well. No medication changes were made.  Please call us if you have new issues that need to be addressed before your next appt.  Your physician wants you to follow-up in: 12 months.  You will receive a reminder letter in the mail two months in advance. If you don't receive a letter, please call our office to schedule the follow-up appointment. 

## 2015-02-28 NOTE — Assessment & Plan Note (Signed)
Asymptomatic. Discussed with him in detail No further workup at this time

## 2015-02-28 NOTE — Assessment & Plan Note (Signed)
No significant murmur appreciated on exam today. Will not repeat echocardiogram unless symptoms of shortness of breath get worse. This seems positional in nature, likely atypical

## 2015-02-28 NOTE — Assessment & Plan Note (Signed)
Blood pressure is well controlled on today's visit. No changes made to the medications. 

## 2015-03-20 ENCOUNTER — Ambulatory Visit: Payer: Self-pay | Admitting: Anesthesiology

## 2015-03-21 ENCOUNTER — Ambulatory Visit: Payer: Medicare Other | Attending: Anesthesiology | Admitting: Anesthesiology

## 2015-03-21 ENCOUNTER — Encounter: Payer: Self-pay | Admitting: Anesthesiology

## 2015-03-21 VITALS — BP 208/111 | HR 71 | Temp 97.9°F | Resp 16 | Ht 68.0 in | Wt 165.0 lb

## 2015-03-21 DIAGNOSIS — M542 Cervicalgia: Secondary | ICD-10-CM | POA: Insufficient documentation

## 2015-03-21 DIAGNOSIS — M1288 Other specific arthropathies, not elsewhere classified, other specified site: Secondary | ICD-10-CM | POA: Diagnosis not present

## 2015-03-21 DIAGNOSIS — M5432 Sciatica, left side: Secondary | ICD-10-CM | POA: Diagnosis not present

## 2015-03-21 DIAGNOSIS — M545 Low back pain: Secondary | ICD-10-CM | POA: Insufficient documentation

## 2015-03-21 DIAGNOSIS — M5030 Other cervical disc degeneration, unspecified cervical region: Secondary | ICD-10-CM | POA: Insufficient documentation

## 2015-03-21 DIAGNOSIS — M5136 Other intervertebral disc degeneration, lumbar region: Secondary | ICD-10-CM | POA: Diagnosis not present

## 2015-03-21 DIAGNOSIS — G8929 Other chronic pain: Secondary | ICD-10-CM | POA: Insufficient documentation

## 2015-03-21 DIAGNOSIS — M79605 Pain in left leg: Secondary | ICD-10-CM | POA: Diagnosis present

## 2015-03-21 DIAGNOSIS — M79604 Pain in right leg: Secondary | ICD-10-CM | POA: Diagnosis present

## 2015-03-21 MED ORDER — LIDOCAINE HCL (PF) 1 % IJ SOLN
INTRAMUSCULAR | Status: AC
  Administered 2015-03-21: 14:00:00
  Filled 2015-03-21: qty 5

## 2015-03-21 MED ORDER — SODIUM CHLORIDE 0.9 % IJ SOLN
INTRAMUSCULAR | Status: AC
Start: 2015-03-21 — End: 2015-03-21
  Administered 2015-03-21: 14:00:00
  Filled 2015-03-21: qty 10

## 2015-03-21 MED ORDER — TRIAMCINOLONE ACETONIDE 40 MG/ML IJ SUSP
INTRAMUSCULAR | Status: AC
Start: 2015-03-21 — End: 2015-03-21
  Administered 2015-03-21: 14:00:00
  Filled 2015-03-21: qty 1

## 2015-03-21 MED ORDER — ROPIVACAINE HCL 2 MG/ML IJ SOLN
INTRAMUSCULAR | Status: AC
  Administered 2015-03-21: 14:00:00
  Filled 2015-03-21: qty 10

## 2015-03-21 NOTE — Progress Notes (Signed)
Safety precautions to be maintained throughout the outpatient stay will include: orient to surroundings, keep bed in low position, maintain call bell within reach at all times, provide assistance with transfer out of bed and ambulation.  

## 2015-03-22 ENCOUNTER — Telehealth: Payer: Self-pay | Admitting: *Deleted

## 2015-03-22 NOTE — Progress Notes (Signed)
PROCEDURE PERFORMED: Lumbar epidural steroid injection under fluroscopic guidance with moderate sedation. L5-S1  CC:  Low back pain with radiation into the bilateral buttocks and some posterior bilateral leg pain  HPI:  Kenson presents for reevaluation today. He's been doing well with his low back pain. He states that his low back pain is approximately 90% improved and is having occasional calf cramping especially with increased activity or prolonged standing. He has resumed running and this has been well tolerated. He is been doing his exercises as prescribed and overall feels that he's made great progress and desires proceed with his third epidural in the series today. Physical Exam:    PERRL, EOMI  Heart RRR   LCTA  Musculoskeletal: Bilateral paraspinous muscle tenderness in the low back.   Assessment: Chronic low back pain with degenerative disc disease and bilateral L5 sciatica that has improved  #2 facet arthropathy  #3 myofascial low back pain  #4 cervicalgia with cervical degenerative disc disease history  PLAN:   1. We'll proceed with a third injection of a  series of epidural steroids as requested by the patient. He's done well with these in the past and that would be indicated at this time  2.  The patient is to return for reevaluation in approximately 2 months. They have been instructed to continue follow-up with their primary care physician regarding their baseline medical care. We'll plan on a repeat epidural steroids injection at that time and he is to continue with stretching strengthening exercises.     Procedure:  LESI:  NOTE: The risks, benefits, and expectations of the procedure have been discussed and explained to the patient who was understanding and in agreement with suggested treatment plan. No guarantees were made.  DESCRIPTION OF PROCEDURE: Lumbar epidural steroid injection with no Versed per patient request, EKG, blood pressure, pulse, and pulse oximetry  monitoring. The procedure was performed with the patient in the prone position under fluoroscopic guidance. A local anesthetic skin wheal of 1.5% plain lidocaine was performed at the L5-S1 site after fluoroscopic identifictation  Using strict aseptic technique, I then advanced an 18-gauge Tuohy epidural needle in the midline via loss-of-resistance  Technique. There was negative aspiration for negative aspiration for heme or  CSF.  I then confirmed position with both AP and Lateral fluoroscan.  A total of 5 mL of Preservative-Free normal saline with 40 mg of Kenalog and 1cc Ropicaine 0.2 percent was injected incrementally via the  epidurally placed needle. Needle removed. The patient tolerated the injection well.   @James  Andree Elk, MD@

## 2015-03-27 NOTE — Telephone Encounter (Signed)
Call back complete

## 2015-05-29 ENCOUNTER — Other Ambulatory Visit: Payer: Self-pay | Admitting: Family Medicine

## 2015-05-29 ENCOUNTER — Other Ambulatory Visit: Payer: Self-pay | Admitting: *Deleted

## 2015-05-29 DIAGNOSIS — E785 Hyperlipidemia, unspecified: Secondary | ICD-10-CM

## 2015-05-29 MED ORDER — ATORVASTATIN CALCIUM 10 MG PO TABS: ORAL_TABLET | ORAL | Status: DC

## 2015-06-09 ENCOUNTER — Other Ambulatory Visit: Payer: Self-pay

## 2015-06-09 DIAGNOSIS — E291 Testicular hypofunction: Secondary | ICD-10-CM

## 2015-06-09 NOTE — Telephone Encounter (Signed)
Is it ok to refill patient;s androgel? Fax from [facility] came through-aa

## 2015-06-12 MED ORDER — TESTOSTERONE 20.25 MG/ACT (1.62%) TD GEL: TRANSDERMAL | Status: DC

## 2015-06-21 ENCOUNTER — Telehealth: Payer: Self-pay

## 2015-06-21 NOTE — Telephone Encounter (Signed)
Androgel for 4 pumps needed PA, called and spoke to a representative and this got approved through 05/05/16, patient and pharmacy was notified-aa

## 2015-08-04 LAB — BASIC METABOLIC PANEL
BUN: 22 mg/dL — AB (ref 4–21)
CREATININE: 1.5 mg/dL — AB (ref 0.6–1.3)
GLUCOSE: 95 mg/dL
Potassium: 4.5 mmol/L (ref 3.4–5.3)
SODIUM: 139 mmol/L (ref 137–147)

## 2015-08-04 LAB — CBC AND DIFFERENTIAL
HEMATOCRIT: 47 % (ref 41–53)
HEMOGLOBIN: 16 g/dL (ref 13.5–17.5)
NEUTROS ABS: 2 /uL
PLATELETS: 186 10*3/uL (ref 150–399)
WBC: 5.1 10^3/mL

## 2015-08-04 LAB — HEPATIC FUNCTION PANEL
ALK PHOS: 67 U/L (ref 25–125)
ALT: 23 U/L (ref 10–40)
AST: 23 U/L (ref 14–40)
Bilirubin, Total: 0.6 mg/dL

## 2015-08-04 LAB — TSH: TSH: 2.16 u[IU]/mL (ref 0.41–5.90)

## 2015-08-08 ENCOUNTER — Ambulatory Visit (INDEPENDENT_AMBULATORY_CARE_PROVIDER_SITE_OTHER): Payer: Medicare Other | Admitting: Family Medicine

## 2015-08-08 ENCOUNTER — Encounter: Payer: Self-pay | Admitting: Family Medicine

## 2015-08-08 VITALS — BP 156/72 | HR 52 | Temp 98.3°F | Resp 14 | Ht 67.25 in | Wt 175.0 lb

## 2015-08-08 DIAGNOSIS — E291 Testicular hypofunction: Secondary | ICD-10-CM | POA: Diagnosis not present

## 2015-08-08 DIAGNOSIS — Z0000 Encounter for general adult medical examination without abnormal findings: Secondary | ICD-10-CM | POA: Diagnosis not present

## 2015-08-08 DIAGNOSIS — M2550 Pain in unspecified joint: Secondary | ICD-10-CM | POA: Diagnosis not present

## 2015-08-08 DIAGNOSIS — Z09 Encounter for follow-up examination after completed treatment for conditions other than malignant neoplasm: Secondary | ICD-10-CM

## 2015-08-08 DIAGNOSIS — Z1211 Encounter for screening for malignant neoplasm of colon: Secondary | ICD-10-CM

## 2015-08-08 LAB — IFOBT (OCCULT BLOOD): IFOBT: NEGATIVE

## 2015-08-08 NOTE — Progress Notes (Signed)
Patient ID: Thomas [name], male   DOB: [birth year], 82 y.o.   MRN: [id number]  Visit Date: 08/08/2015  Today's Provider: [name], MD   Chief Complaint  Patient presents with  . Medicare Wellness   Subjective:   Thomas [name] is a 82 y.o. male who presents today for his Subsequent Annual Wellness Visit. He feels fairly well. He reports exercising daily stretching ,walking 15 to 20 minutes and trying to run. He reports he is sleeping fairly well. Patient spends a lot of time outdoors and is convinced that he has a rickettsial disease that is from the tick vector. He actually saw a Dr. Renne [name] from [place] at [facility].  it is of note that the patient remarried a year ago and seems to be pleased with the situation.his first wife died many years ago and that devastated him personally. He functioned very well as a Curator for about 40 years. He does have chronic insomnia since his wife died. He has to physician children. One of them is a Occupational hygienist in [place]. Patient never had colonoscopy. He use to work through [facility] and had immunizations through them, will get those records.   Review of Systems  Constitutional: Positive for diaphoresis and activity change.  HENT: Negative.   Eyes: Positive for photophobia and visual disturbance.  Respiratory: Negative.   Cardiovascular: Negative.   Gastrointestinal: Negative.   Endocrine: Negative.   Genitourinary: Positive for urgency.  Musculoskeletal: Positive for back pain, arthralgias, neck pain and neck stiffness.  Skin: Negative.   Allergic/Immunologic: Negative.   Neurological: Positive for dizziness and light-headedness.  Hematological: Negative.   Psychiatric/Behavioral: Positive for sleep disturbance.  overall he thinks that he does feel better when he is on testosterone replacement gel. He thinks he has "encephalitis" as over the past 2 years he has had more sleepiness and some memory loss. No fevers but he states that  he does have some sweats at times. No weight loss. Chronic fatigue that is slowly worsened with aging.  Patient Active Problem List   Diagnosis Date Noted  . Second degree AV block, Mobitz type I 02/28/2015  . Arthritis 02/06/2015  . Esophageal reflux 02/06/2015  . Arthritis urica 02/06/2015  . Cannot sleep 02/06/2015  . Arthritis, degenerative 02/06/2015  . Adenocarcinoma, renal cell ([place]) 02/06/2015  . Benign prostatic hyperplasia with urinary obstruction 11/21/2014  . Personal history of other malignant neoplasm of kidney 11/21/2014  . Palpitations 12/31/2013  . Hyperlipidemia 11/02/2010  . HYPERTENSION, BENIGN 04/16/2010  . Aortic valve disorder 04/16/2010    Social History   Social History  . Marital Status: Widowed    Spouse Name: N/A  . Number of Children: N/A  . Years of Education: N/A   Occupational History  . Not on file.   Social History Main Topics  . Smoking status: Never Smoker   . Smokeless tobacco: Never Used  . Alcohol Use: 1.2 oz/week    1 Glasses of wine, 1 Cans of beer per week  . Drug Use: No  . Sexual Activity: Not Currently   Other Topics Concern  . Not on file   Social History Narrative    Past Surgical History  Procedure Laterality Date  . Laparoscopic ablation renal mass    . Biopsy prostate  2001 & 2003    x 2  . Prostate surgery    . Cardiac catheterization  1990's  . Hernia repair  2012  . Ankle surgery  right ankle fracture  . Tonsillectomy and adenoidectomy      His family history includes Aortic stenosis in his mother; Arthritis in his father; Heart attack in his brother.    Outpatient Prescriptions Prior to Visit  Medication Sig Dispense Refill  . aspirin 81 MG tablet Take by mouth.    Marland Kitchen atorvastatin (LIPITOR) 10 MG tablet TAKE ONE (1) TABLET EACH DAY 90 tablet 3  . COLCRYS 0.6 MG tablet Take 0.6 mg by mouth as needed.     . diclofenac sodium (VOLTAREN) 1 % GEL Place onto the skin.    Marland Kitchen dutasteride (AVODART) 0.5 MG  capsule Take 1 mg by mouth as needed.     Marland Kitchen esomeprazole (NEXIUM) 40 MG capsule Take 40 mg by mouth daily as needed.     . febuxostat (ULORIC) 40 MG tablet Take 10 mg by mouth daily.    . furosemide (LASIX) 20 MG tablet Take 1 tablet (20 mg total) by mouth daily as needed. 30 tablet 11  . MULTIPLE VITAMIN PO Take by mouth.    . Omega-3 Fatty Acids (FISH OIL) 1000 MG CAPS Take 1,000 mg by mouth 1 dose over 24 hours.      . silodosin (RAPAFLO) 8 MG CAPS capsule Take by mouth as needed.     . temazepam (RESTORIL) 30 MG capsule Take by mouth at bedtime as needed.     . terazosin (HYTRIN) 1 MG capsule Take 1 mg by mouth at bedtime.      . Testosterone (ANDROGEL PUMP) 20.25 MG/ACT (1.62%) GEL 4 pumps daily 225 g 5  . vitamin E 400 UNIT capsule Take 400 Units by mouth daily.    Marland Kitchen lisinopril (PRINIVIL,ZESTRIL) 5 MG tablet Take 1 tablet (5 mg total) by mouth 2 (two) times daily. (Patient taking differently: Take 5 mg by mouth daily. ) 180 tablet 0  . propranolol (INDERAL) 40 MG tablet Take 1 tablet (40 mg total) by mouth 3 (three) times daily as needed. (Patient taking differently: Take 20 mg by mouth daily. ) 270 tablet 0   No facility-administered medications prior to visit.    Allergies  Allergen Reactions  . Iodine     IVP contrast  . Penicillins     Patient Care Team: Jerrol Banana., MD as PCP - General (Family Medicine)  Objective:   Vitals:  Filed Vitals:   08/08/15 1051  BP: 156/72  Pulse: 52  Temp: 98.3 F (36.8 C)  Resp: 14  Height: 5' 7.25" (1.708 m)  Weight: 175 lb (79.379 kg)    Physical Exam  Constitutional: He is oriented to person, place, and time. He appears well-developed.  HENT:  Head: Normocephalic and atraumatic.  Right Ear: External ear normal.  Left Ear: External ear normal.  Eyes: Conjunctivae are normal. Pupils are equal, round, and reactive to light.  Neck: Normal range of motion. Neck supple.  Cardiovascular: Normal rate, regular rhythm,  normal heart sounds and intact distal pulses.   Pulmonary/Chest: Effort normal and breath sounds normal. No respiratory distress.  Abdominal: Soft. There is no tenderness. There is no rebound.  Genitourinary: Penis normal. Guaiac negative stool.  Musculoskeletal: He exhibits no edema or tenderness.  Neurological: He is alert and oriented to person, place, and time.  Skin: No rash noted. No erythema.  Psychiatric: He has a normal mood and affect. His behavior is normal. Judgment and thought content normal.    Activities of Daily Living In your present state of health, do you  have any difficulty performing the following activities: 08/08/2015 02/07/2015  Hearing? Y N  Vision? Y N  Difficulty concentrating or making decisions? N N  Walking or climbing stairs? N N  Dressing or bathing? N N  Doing errands, shopping? N N    Fall Risk Assessment Fall Risk  08/08/2015 03/21/2015 02/15/2015 02/07/2015 01/17/2015  Falls in the past year? No No No No No     Depression Screen PHQ 2/9 Scores 08/08/2015 03/21/2015 02/07/2015 01/17/2015  PHQ - 2 Score 0 0 0 0    Cognitive Testing - 6-CIT    Year: 0 4 points  Month: 0 3 points  Memorize "[place], [place], [address], [place]"  Time (within 1 hour:) 0 3 points  Count backwards from 20: 0 2 4 points  Name months of year: 0 2 4 points  Repeat Address: 0 2 4 6 8 10  points   Total Score: 3/28  Interpretation : Normal (0-7) Abnormal (8-28)  Audit-C Alcohol Use Screening  Question Answer Points  How often do you have alcoholic drink? 2-3 times weekly   On days you do drink alcohol, how many drinks do you typically consume? 1 to 2   How oftey will you drink 6 or more in a total? never   Total Score:     A score of 3 or more in women, and 4 or more in men indicates increased risk for alcohol abuse, EXCEPT if all of the points are from question 1.    Assessment & Plan:     Annual Wellness Visit  Reviewed patient's Family Medical History Reviewed  and updated list of patient's medical providers Assessment of cognitive impairment was done Assessed patient's functional ability Established a written schedule for health screening [place] Completed and Reviewed  2. Hypogonadism male  3. Arthralgia  4. Need for immunization follow-up Requested immunization record from [facility]. Awaiting information. May need more immunizations. Patient had 3 episodes of shingels break out and asked about the shot. advised patient to check with insurance on the cost and we can discuss this if he wants to proceed.  5. Colon cancer screening - IFOBT POC (occult bld, rslt in office) 6. Concern for infectious disease This is being addressed by ID and I will be happy to follow up at any point in time. Hopefully we get these records. 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   I have done the exam and reviewed the above chart and it is accurate to the best of my knowledge.    Miguel Aschoff MD [facility] 08/08/2015 10:52 AM  ------------------------------------------------------------------------------------------------------------

## 2015-08-21 ENCOUNTER — Encounter: Payer: Self-pay | Admitting: Family Medicine

## 2015-09-14 ENCOUNTER — Ambulatory Visit (INDEPENDENT_AMBULATORY_CARE_PROVIDER_SITE_OTHER): Payer: Medicare Other | Admitting: Cardiovascular Disease

## 2015-09-14 ENCOUNTER — Encounter: Payer: Self-pay | Admitting: Cardiovascular Disease

## 2015-09-14 ENCOUNTER — Telehealth: Payer: Self-pay | Admitting: Cardiovascular Disease

## 2015-09-14 VITALS — BP 146/86 | HR 58 | Ht 67.0 in | Wt 169.0 lb

## 2015-09-14 DIAGNOSIS — I441 Atrioventricular block, second degree: Secondary | ICD-10-CM | POA: Diagnosis not present

## 2015-09-14 DIAGNOSIS — R001 Bradycardia, unspecified: Secondary | ICD-10-CM | POA: Diagnosis not present

## 2015-09-14 DIAGNOSIS — E785 Hyperlipidemia, unspecified: Secondary | ICD-10-CM

## 2015-09-14 DIAGNOSIS — I10 Essential (primary) hypertension: Secondary | ICD-10-CM | POA: Diagnosis not present

## 2015-09-14 DIAGNOSIS — R0789 Other chest pain: Secondary | ICD-10-CM | POA: Diagnosis not present

## 2015-09-14 MED ORDER — HYDRALAZINE HCL 25 MG PO TABS: 25.0000 mg | ORAL_TABLET | Freq: Three times a day (TID) | ORAL | Status: DC | PRN

## 2015-09-14 NOTE — Assessment & Plan Note (Signed)
He reports heart rate of 32 does night Possibly secondary to PVCs We have offered a Holter monitor if symptoms get worse Will monitor for now

## 2015-09-14 NOTE — Progress Notes (Signed)
Patient ID: Thomas [name], male    DOB: [birth year], 82 y.o.   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 He does have a history of PVCs at nighttime and takes propranolol. Prior history of partial nephrectomy secondary to cancer History of chronic renal insufficiency, baseline creatinine 1.3 up to 1.7  In follow-up today, he reports that last night his blood pressure was low [id number] systolic, heart rate low 32. Possibly secondary to PVCs Worked in the garden all day, then went for a run 1 mile Did not drink much fluids Wife felt he was dehydrated  Stopped his Hytrin  around 6 weeks ago for low blood pressure But does report having labile pressures, occasionally blood pressure is very high  Denies shortness of breath on exertion, no chest pain Takes propranolol at nighttime for palpitations  Has been seen by a specialist in Lyme disease near [facility], was told that he had various serologies that were positive, possibly positive for Lyme, being treated  EKG on today's visit shows normal sinus rhythm with rate 58 bpm, right bundle branch block, first-degree AV block   Allergies  Allergen Reactions  . Iodine     IVP contrast  . Penicillins     Current Outpatient Prescriptions on File Prior to Visit  Medication Sig Dispense Refill  . aspirin 81 MG tablet Take by mouth.    Marland Kitchen atorvastatin (LIPITOR) 10 MG tablet TAKE ONE (1) TABLET EACH DAY 90 tablet 3  . co-enzyme Q-10 30 MG capsule Take 30 mg by mouth 3 (three) times daily.    Marland Kitchen COLCRYS 0.6 MG tablet Take 0.6 mg by mouth as needed.     . diclofenac sodium (VOLTAREN) 1 % GEL Place onto the skin.    Marland Kitchen dutasteride (AVODART) 0.5 MG capsule Take 1 mg by  mouth as needed.     Marland Kitchen esomeprazole (NEXIUM) 40 MG capsule Take 40 mg by mouth daily as needed.     . febuxostat (ULORIC) 40 MG tablet Take 10 mg by mouth daily.    . furosemide (LASIX) 20 MG tablet Take 1 tablet (20 mg total) by mouth daily as needed. 30 tablet 11  . Garlic 10 MG CAPS Take by mouth.    Marland Kitchen lisinopril (PRINIVIL,ZESTRIL) 5 MG tablet Take 5 mg by mouth daily.    . MULTIPLE VITAMIN PO Take by mouth.    . Omega-3 Fatty Acids (FISH OIL) 1000 MG CAPS Take 1,000 mg by mouth 1 dose over 24 hours.      . propranolol (INDERAL) 20 MG tablet Take 20 mg by mouth daily.    . Saccharomyces boulardii (PROBIOTIC) 250 MG CAPS Take by mouth.    . silodosin (RAPAFLO) 8 MG CAPS capsule Take by mouth as needed.     . temazepam (RESTORIL) 30 MG capsule Take by mouth at bedtime as needed.     . Testosterone (ANDROGEL PUMP) 20.25 MG/ACT (1.62%) GEL 4 pumps daily 225 g 5  . vitamin E 400 UNIT capsule Take 400 Units by mouth daily.     No current facility-administered medications on file prior to visit.    Past Medical History  Diagnosis Date  . Hypertension   . Aortic  valve disorders   . Hyperlipidemia   . PVC's (premature ventricular contractions)   . Lyme disease     Past Surgical History  Procedure Laterality Date  . Laparoscopic ablation renal mass    . Biopsy prostate  2001 & 2003    x 2  . Prostate surgery    . Cardiac catheterization  1990's  . Hernia repair  2012  . Ankle surgery      right ankle fracture  . Tonsillectomy and adenoidectomy      Social History  reports that he has never smoked. He has never used smokeless tobacco. He reports that he does not drink alcohol or use illicit drugs.  Family History family history includes Aortic stenosis in his mother; Arthritis in his father; Heart attack in his brother.   Review of Systems  Constitutional: Negative.   HENT: Negative.   Respiratory: Negative.   Cardiovascular: Positive for palpitations.  Gastrointestinal:  Negative.   Musculoskeletal: Negative.   Neurological: Positive for light-headedness.  Psychiatric/Behavioral: Negative.   All other systems reviewed and are negative.   BP 146/86 mmHg  Pulse 58  Ht 5\' 7"  (1.702 m)  Wt 169 lb (76.658 kg)  BMI 26.46 kg/m2  Physical Exam  Constitutional: He is oriented to person, place, and time. He appears well-developed and well-nourished.  HENT:  Head: Normocephalic.  Nose: Nose normal.  Mouth/Throat: Oropharynx is clear and moist.  Eyes: Conjunctivae are normal. Pupils are equal, round, and reactive to light.  Neck: Normal range of motion. Neck supple. No JVD present.  Cardiovascular: Normal rate, regular rhythm, S1 normal, S2 normal, normal heart sounds and intact distal pulses.   Occasional extrasystoles are present. Exam reveals no gallop and no friction rub.   No murmur heard. Pulmonary/Chest: Effort normal and breath sounds normal. No respiratory distress. He has no wheezes. He has no rales. He exhibits no tenderness.  Abdominal: Soft. Bowel sounds are normal. He exhibits no distension. There is no tenderness.  Musculoskeletal: Normal range of motion. He exhibits no edema or tenderness.  Lymphadenopathy:    He has no cervical adenopathy.  Neurological: He is alert and oriented to person, place, and time. Coordination normal.  Skin: Skin is warm and dry. No rash noted. No erythema.  Psychiatric: He has a normal mood and affect. His behavior is normal. Judgment and thought content normal.      Assessment and Plan   Nursing note and vitals reviewed.      Of his hypertension

## 2015-09-14 NOTE — Assessment & Plan Note (Signed)
Labile blood pressure Recently stopped the Hytrin for low pressure Was low yesterday, possibly exacerbated by dehydration, also unclear if his monitor was reading accurately as he had PVCs most likely  Recommended he increase his fluid hydration If blood pressure runs high, would take hydralazine 25 mg when necessary. He will stay on his low-dose ACE inhibitor

## 2015-09-14 NOTE — Assessment & Plan Note (Signed)
Cholesterol is at goal on the current lipid regimen. No changes to the medications were made.  

## 2015-09-14 NOTE — Telephone Encounter (Signed)
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 He stopped taking the Hytrin roughly a month ago and decreased his propranolol to 10 mg the last couple of weeks. He is still taking the lisinopril 5mg  in the am. They discovered that he has lyme disease and he is also on medications for that which he stated can cause heart block. He reported that he has always had some cardiac irritability but that last night he had some low blood pressures and heart rate was down to 32. He reported that his heart rate normally stays between 30-40's but that he was somewhat syncopal at bedtime last night with some tightness in the chest. He stated that he just felt different and wanted to know if Dr. Rockey Situ had some recommendations. Instructed Dr. Madelin Headings if he had any chest pain or shortness of breath to please report to the closest ED. He verbalized understanding but stated he just wanted to make Dr. Rockey Situ aware in case he has other options. Let him know that I would forward this message to him and we would be in touch.

## 2015-09-14 NOTE — Telephone Encounter (Signed)
Dr. Rockey Situ advised patient to come in to be seen at 12pm today.  Called patient and he agreed to come in to be evaluated.

## 2015-09-14 NOTE — Patient Instructions (Signed)
You are doing well.  Please take hydralazine as needed for pressures >170  Please call us if you have new issues that need to be addressed before your next appt.  Your physician wants you to follow-up in: 6 months.  You will receive a reminder letter in the mail two months in advance. If you don't receive a letter, please call our office to schedule the follow-up appointment.

## 2015-09-14 NOTE — Telephone Encounter (Signed)
Pt wife called, states his BP was 105/55, HR 32. States he felt a tightness in his chest all night, felt lightheaded. BP this a.m. Was 122/63, HR 42. States he does feel still a "little tightness". Please call.

## 2015-11-30 ENCOUNTER — Other Ambulatory Visit: Payer: Self-pay | Admitting: Family Medicine

## 2015-11-30 ENCOUNTER — Other Ambulatory Visit: Payer: Self-pay | Admitting: *Deleted

## 2015-11-30 DIAGNOSIS — E785 Hyperlipidemia, unspecified: Secondary | ICD-10-CM

## 2015-11-30 MED ORDER — LISINOPRIL 5 MG PO TABS: 5.0000 mg | ORAL_TABLET | Freq: Every day | ORAL | 3 refills | Status: DC

## 2015-12-04 ENCOUNTER — Other Ambulatory Visit: Payer: Self-pay | Admitting: Family Medicine

## 2015-12-04 DIAGNOSIS — E785 Hyperlipidemia, unspecified: Secondary | ICD-10-CM

## 2015-12-05 ENCOUNTER — Other Ambulatory Visit: Payer: Self-pay

## 2015-12-05 MED ORDER — LISINOPRIL 5 MG PO TABS: 5.0000 mg | ORAL_TABLET | Freq: Every day | ORAL | 3 refills | Status: DC

## 2016-01-17 ENCOUNTER — Other Ambulatory Visit: Payer: Self-pay

## 2016-01-17 DIAGNOSIS — E291 Testicular hypofunction: Secondary | ICD-10-CM

## 2016-01-17 MED ORDER — TESTOSTERONE 20.25 MG/ACT (1.62%) TD GEL: TRANSDERMAL | 5 refills | Status: DC

## 2016-01-17 NOTE — Telephone Encounter (Signed)
Please call in Androgel

## 2016-01-17 NOTE — Telephone Encounter (Signed)
Did not call in rx. Per pharmacist pt is wanting another form of testosterone due to out-of-pocket-cost >$200. Patient has appt with Dr. Rosanna Randy 02/07/16.

## 2016-01-17 NOTE — Telephone Encounter (Signed)
Dr Marlan Palau patient, please review=aa

## 2016-01-29 ENCOUNTER — Other Ambulatory Visit: Payer: Self-pay | Admitting: *Deleted

## 2016-01-29 MED ORDER — PROPRANOLOL HCL 20 MG PO TABS: 20.0000 mg | ORAL_TABLET | Freq: Three times a day (TID) | ORAL | 3 refills | Status: DC | PRN

## 2016-02-07 ENCOUNTER — Ambulatory Visit: Payer: Self-pay | Admitting: Family Medicine

## 2016-02-08 ENCOUNTER — Ambulatory Visit (INDEPENDENT_AMBULATORY_CARE_PROVIDER_SITE_OTHER): Payer: Medicare Other | Admitting: Family Medicine

## 2016-02-08 ENCOUNTER — Encounter: Payer: Self-pay | Admitting: Family Medicine

## 2016-02-08 VITALS — BP 116/62 | HR 58 | Temp 97.6°F | Resp 16 | Wt 172.0 lb

## 2016-02-08 DIAGNOSIS — I10 Essential (primary) hypertension: Secondary | ICD-10-CM

## 2016-02-08 DIAGNOSIS — E7800 Pure hypercholesterolemia, unspecified: Secondary | ICD-10-CM | POA: Diagnosis not present

## 2016-02-08 DIAGNOSIS — E291 Testicular hypofunction: Secondary | ICD-10-CM

## 2016-02-08 DIAGNOSIS — C642 Malignant neoplasm of left kidney, except renal pelvis: Secondary | ICD-10-CM

## 2016-02-08 NOTE — Progress Notes (Signed)
Subjective:  HPI  Hypertension, follow-up:  BP Readings from Last 3 Encounters:  02/08/16 116/62  09/14/15 (!) 146/86  08/08/15 (!) 156/72    He was last seen for hypertension 6 months ago.  BP at that visit was 146/86. Management since that visit includes none. He reports good compliance with treatment. He is not having side effects.  He is exercising. He is adherent to low salt diet.   Outside blood pressures are being checked. He is experiencing none.  Patient denies chest pain, chest pressure/discomfort, claudication, dyspnea, exertional chest pressure/discomfort, fatigue, irregular heart beat, lower extremity edema, near-syncope, orthopnea, palpitations, paroxysmal nocturnal dyspnea and syncope.   Cardiovascular risk factors include advanced age (older than 33 for men, 60 for women), hypertension and male gender.   Wt Readings from Last 3 Encounters:  02/08/16 172 lb (78 kg)  09/14/15 169 lb (76.7 kg)  08/08/15 175 lb (79.4 kg)   ------------------------------------------------------------------------    Lipid/Cholesterol, Follow-up:   Last seen for this 6 months ago.  Management changes since that visit include none. . Last Lipid Panel:    Component Value Date/Time   CHOL 122 08/22/2014   CHOL 144 06/26/2012 1106   TRIG 50 08/22/2014   TRIG 46 06/26/2012 1106   HDL 56 08/22/2014   HDL 61 (H) 06/26/2012 1106   VLDL 9 06/26/2012 1106   LDLCALC 56 08/22/2014   LDLCALC 74 06/26/2012 1106    Risk factors for vascular disease include hypercholesterolemia and hypertension  He reports good compliance with treatment. He is not having side effects.  Current exercise: running/ jogging  Wt Readings from Last 3 Encounters:  02/08/16 172 lb (78 kg)  09/14/15 169 lb (76.7 kg)  08/08/15 175 lb (79.4 kg)   ------------------------------------------------------------------- Low testosterone- Pt reports that the Angrogel is not covered by his insurance is 1200  dollars and will need something generic. 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   Lyme's Disease- Pt reports that he is now taking Cefdnir for this all the time.    Prior to Admission medications   Medication Sig Start Date End Date Taking? Authorizing Provider  aspirin 81 MG tablet Take by mouth. 07/24/12   Historical Provider, MD  atorvastatin (LIPITOR) 10 MG tablet TAKE ONE (1) TABLET EACH DAY 12/04/15   Richard Maceo Pro., MD  cefdinir (OMNICEF) 300 MG capsule Take 300 mg by mouth 2 (two) times daily.  09/06/15   Historical Provider, MD  co-enzyme Q-10 30 MG capsule Take 30 mg by mouth 3 (three) times daily.    Historical Provider, MD  COLCRYS 0.6 MG tablet Take 0.6 mg by mouth as needed.  10/07/12   Historical Provider, MD  diclofenac sodium (VOLTAREN) 1 % GEL Place onto the skin. 01/20/14   Historical Provider, MD  dutasteride (AVODART) 0.5 MG capsule Take 1 mg by mouth as needed.     Historical Provider, MD  esomeprazole (NEXIUM) 40 MG capsule Take 40 mg by mouth daily as needed.     Historical Provider, MD  febuxostat (ULORIC) 40 MG tablet Take 10 mg by mouth daily.    Historical Provider, MD  furosemide (LASIX) 20 MG tablet Take 1 tablet (20 mg total) by mouth daily as needed. 12/31/13   Minna Merritts, MD  Garlic 10 MG CAPS Take by mouth.    Historical Provider, MD  hydrALAZINE (APRESOLINE) 25 MG tablet Take 1 tablet (25 mg total) by mouth 3 (three) times daily  as needed. 09/14/15   Minna Merritts, MD  lisinopril (PRINIVIL,ZESTRIL) 5 MG tablet Take 1 tablet (5 mg total) by mouth daily. 12/05/15   Minna Merritts, MD  metroNIDAZOLE (FLAGYL) 250 MG tablet Take 250 mg by mouth. Takes 1 tablet twice daily per 2 days a week. 09/06/15   Historical Provider, MD  MULTIPLE VITAMIN PO Take by mouth. 07/24/12   Historical Provider, MD  Omega-3 Fatty Acids (FISH OIL) 1000 MG CAPS Take 1,000 mg by mouth 1 dose over 24 hours.      Historical Provider, MD  propranolol (INDERAL) 20 MG tablet  Take 1 tablet (20 mg total) by mouth 3 (three) times daily as needed. 01/29/16   Minna Merritts, MD  Saccharomyces boulardii (PROBIOTIC) 250 MG CAPS Take by mouth.    Historical Provider, MD  silodosin (RAPAFLO) 8 MG CAPS capsule Take by mouth as needed.  07/24/12   Historical Provider, MD  temazepam (RESTORIL) 30 MG capsule Take by mouth at bedtime as needed.  01/20/14   Historical Provider, MD  Testosterone (ANDROGEL PUMP) 20.25 MG/ACT (1.62%) GEL 4 pumps daily 01/17/16   Birdie Sons, MD  vitamin E 400 UNIT capsule Take 400 Units by mouth daily.    Historical Provider, MD    Patient Active Problem List   Diagnosis Date Noted  . Second degree AV block, Mobitz type I 02/28/2015  . Arthritis 02/06/2015  . Esophageal reflux 02/06/2015  . Arthritis urica 02/06/2015  . Cannot sleep 02/06/2015  . Arthritis, degenerative 02/06/2015  . Adenocarcinoma, renal cell ([place]) 02/06/2015  . Benign prostatic hyperplasia with urinary obstruction 11/21/2014  . Personal history of other malignant neoplasm of kidney 11/21/2014  . Palpitations 12/31/2013  . Hyperlipidemia 11/02/2010  . HYPERTENSION, BENIGN 04/16/2010  . Aortic valve disorder 04/16/2010    Past Medical History:  Diagnosis Date  . Aortic valve disorders   . Hyperlipidemia   . Hypertension   . Lyme disease   . PVC's (premature ventricular contractions)     Social History   Social History  . Marital status: Widowed    Spouse name: N/A  . Number of children: N/A  . Years of education: N/A   Occupational History  . Not on file.   Social History Main Topics  . Smoking status: Never Smoker  . Smokeless tobacco: Never Used  . Alcohol use No  . Drug use: No  . Sexual activity: Not Currently   Other Topics Concern  . Not on file   Social History Narrative  . No narrative on file    Allergies  Allergen Reactions  . Iodine     IVP contrast  . Penicillins     Review of Systems  Constitutional: Negative.   HENT:  Negative.   Eyes: Negative.   Respiratory: Negative.   Cardiovascular: Negative.   Gastrointestinal: Negative.   Genitourinary: Negative.   Musculoskeletal: Negative.   Skin: Negative.   Neurological: Negative.   Endo/Heme/Allergies: Negative.   Psychiatric/Behavioral: Negative.     Immunization History  Administered Date(s) Administered  . Influenza, High Dose Seasonal PF 02/07/2015   Objective:  BP 116/62 (BP Location: Left Arm, Patient Position: Sitting, Cuff Size: Normal)   Pulse (!) 58   Temp 97.6 F (36.4 C) (Oral)   Resp 16   Wt 172 lb (78 kg)   BMI 26.94 kg/m   Physical Exam  Constitutional: He is oriented to person, place, and time and well-developed, well-nourished, and in no distress.  Well-nourished  well-developed white male who appears younger than his age of 67  HENT:  Head: Normocephalic and atraumatic.  Right Ear: External ear normal.  Left Ear: External ear normal.  Nose: Nose normal.  Eyes: Conjunctivae and EOM are normal. Pupils are equal, round, and reactive to light.  Neck: Normal range of motion. Neck supple.  Cardiovascular: Normal rate, regular rhythm, normal heart sounds and intact distal pulses.   Pulmonary/Chest: Effort normal and breath sounds normal.  Genitourinary:  Genitourinary Comments: Right lobe 3+ left lobe 2+ no nodules  Musculoskeletal: Normal range of motion.  Neurological: He is alert and oriented to person, place, and time. He has normal reflexes. Gait normal. GCS score is 15.  Skin: Skin is warm and dry.  Psychiatric: Mood, memory, affect and judgment normal.    Lab Results  Component Value Date   WBC 5.1 08/04/2015   HGB 16.0 08/04/2015   HCT 47 08/04/2015   PLT 186 08/04/2015   GLUCOSE 105 (H) 06/26/2012   CHOL 122 08/22/2014   TRIG 50 08/22/2014   HDL 56 08/22/2014   LDLCALC 56 08/22/2014   TSH 2.16 08/04/2015   PSA 5.8 08/22/2014    CMP     Component Value Date/Time   NA 139 08/04/2015   NA 138 06/26/2012  1106   K 4.5 08/04/2015   K 4.4 06/26/2012 1106   CL 105 06/26/2012 1106   CO2 29 06/26/2012 1106   GLUCOSE 105 (H) 06/26/2012 1106   BUN 22 (A) 08/04/2015   BUN 16 06/26/2012 1106   CREATININE 1.5 (A) 08/04/2015   CREATININE 1.31 (H) 06/26/2012 1106   CALCIUM 8.9 06/26/2012 1106   PROT 7.5 06/26/2012 1106   ALBUMIN 3.8 06/26/2012 1106   AST 23 08/04/2015   AST 23 06/26/2012 1106   ALT 23 08/04/2015   ALT 24 06/26/2012 1106   ALKPHOS 67 08/04/2015   ALKPHOS 68 06/26/2012 1106   BILITOT 0.6 06/26/2012 1106   GFRNONAA 52 (L) 06/26/2012 1106   GFRAA >60 06/26/2012 1106    Assessment and Plan :   1. Pure hypercholesterolemia Stable.    2. Hypogonadism male 1% testosterone compound 5 grams daily rx written for him to take to [facility] to be compounded.  He is also written for depo testosterone to give a shot to himself monthly. He is a retired Dealer. Will consider Clomid in the future for treatment. Patient is well aware a skilled testosterone. He declines a PSA today although offered. 3. Renal cell carcinoma of left kidney (HCC) Cryotherapy in 2006. Left kidney. Stable since.  4. HYPERTENSION, BENIGN Stable.  5. Self-reported Lymes disease Treated by a private infectious disease doctor out of [place] who evidently specializes in rickettsial diseases.   I have done the exam and reviewed the above chart and it is accurate to the best of my knowledge.  Miguel Aschoff MD [facility] 02/08/2016 11:27 AM

## 2016-02-15 ENCOUNTER — Ambulatory Visit
Admission: RE | Admit: 2016-02-15 | Discharge: 2016-02-15 | Disposition: A | Discharge status: Home / Self Care | Payer: Medicare Other | Source: Ambulatory Visit | Attending: Anesthesiology | Admitting: Anesthesiology

## 2016-02-15 ENCOUNTER — Other Ambulatory Visit: Payer: Self-pay | Admitting: Anesthesiology

## 2016-02-15 ENCOUNTER — Encounter: Payer: Self-pay | Admitting: Anesthesiology

## 2016-02-15 ENCOUNTER — Ambulatory Visit: Payer: Medicare Other | Admitting: Anesthesiology

## 2016-02-15 VITALS — BP 164/89 | HR 48 | Resp 21 | Ht 68.0 in | Wt 165.0 lb

## 2016-02-15 DIAGNOSIS — M5136 Other intervertebral disc degeneration, lumbar region: Secondary | ICD-10-CM | POA: Insufficient documentation

## 2016-02-15 DIAGNOSIS — R52 Pain, unspecified: Secondary | ICD-10-CM

## 2016-02-15 DIAGNOSIS — G8929 Other chronic pain: Secondary | ICD-10-CM | POA: Diagnosis not present

## 2016-02-15 DIAGNOSIS — M5432 Sciatica, left side: Secondary | ICD-10-CM

## 2016-02-15 DIAGNOSIS — M5030 Other cervical disc degeneration, unspecified cervical region: Secondary | ICD-10-CM | POA: Diagnosis not present

## 2016-02-15 DIAGNOSIS — M79605 Pain in left leg: Secondary | ICD-10-CM | POA: Diagnosis present

## 2016-02-15 MED ORDER — ROPIVACAINE HCL 2 MG/ML IJ SOLN
INTRAMUSCULAR | Status: AC
  Administered 2016-02-15: 14:00:00
  Filled 2016-02-15: qty 10

## 2016-02-15 MED ORDER — SODIUM CHLORIDE 0.9 % IJ SOLN
INTRAMUSCULAR | Status: AC
  Administered 2016-02-15: 14:00:00
  Filled 2016-02-15: qty 10

## 2016-02-15 MED ORDER — LIDOCAINE HCL (PF) 1 % IJ SOLN
INTRAMUSCULAR | Status: AC
  Administered 2016-02-15: 14:00:00
  Filled 2016-02-15: qty 5

## 2016-02-15 MED ORDER — TRIAMCINOLONE ACETONIDE 40 MG/ML IJ SUSP: 40.0000 mg | Freq: Once | INTRAMUSCULAR | Status: DC

## 2016-02-15 MED ORDER — TRIAMCINOLONE ACETONIDE 40 MG/ML IJ SUSP
INTRAMUSCULAR | Status: AC
Start: 2016-02-15 — End: 2016-02-15
  Administered 2016-02-15: 14:00:00
  Filled 2016-02-15: qty 1

## 2016-02-15 MED ORDER — SODIUM CHLORIDE 0.9% FLUSH: 10.0000 mL | Freq: Once | INTRAVENOUS | Status: DC

## 2016-02-15 MED ORDER — IOPAMIDOL (ISOVUE-M 200) INJECTION 41%
INTRAMUSCULAR | Status: AC
  Administered 2016-02-15: 14:00:00
  Filled 2016-02-15: qty 10

## 2016-02-15 MED ORDER — ROPIVACAINE HCL 2 MG/ML IJ SOLN: 10.0000 mL | Freq: Once | INTRAMUSCULAR | Status: DC

## 2016-02-15 NOTE — Patient Instructions (Signed)
Epidural Steroid Injection Patient Information  Description: The epidural space surrounds the nerves as they exit the spinal cord.  In some patients, the nerves can be compressed and inflamed by a bulging disc or a tight spinal canal (spinal stenosis).  By injecting steroids into the epidural space, we can bring irritated nerves into direct contact with a potentially helpful medication.  These steroids act directly on the irritated nerves and can reduce swelling and inflammation which often leads to decreased pain.  Epidural steroids may be injected anywhere along the spine and from the neck to the low back depending upon the location of your pain.   After numbing the skin with local anesthetic (like Novocaine), a small needle is passed into the epidural space slowly.  You may experience a sensation of pressure while this is being done.  The entire block usually last less than 10 minutes.  Conditions which may be treated by epidural steroids:   Low back and leg pain  Neck and arm pain  Spinal stenosis  Post-laminectomy syndrome  Herpes zoster (shingles) pain  Pain from compression fractures  Preparation for the injection:  1. Do not eat any solid food or dairy products within 8 hours of your appointment.  2. You may drink clear liquids up to 3 hours before appointment.  Clear liquids include water, black coffee, juice or soda.  No milk or cream please. 3. You may take your regular medication, including pain medications, with a sip of water before your appointment  Diabetics should hold regular insulin (if taken separately) and take 1/2 normal NPH dos the morning of the procedure.  Carry some sugar containing items with you to your appointment. 4. A driver must accompany you and be prepared to drive you home after your procedure.  5. Bring all your current medications with your. 6. An IV may be inserted and sedation may be given at the discretion of the physician.   7. A blood pressure  cuff, EKG and other monitors will often be applied during the procedure.  Some patients may need to have extra oxygen administered for a short period. 8. You will be asked to provide medical information, including your allergies, prior to the procedure.  We must know immediately if you are taking blood thinners (like Coumadin/Warfarin)  Or if you are allergic to IV iodine contrast (dye). We must know if you could possible be pregnant.  Possible side-effects:  Bleeding from needle site  Infection (rare, may require surgery)  Nerve injury (rare)  Numbness & tingling (temporary)  Difficulty urinating (rare, temporary)  Spinal headache ( a headache worse with upright posture)  Light -headedness (temporary)  Pain at injection site (several days)  Decreased blood pressure (temporary)  Weakness in arm/leg (temporary)  Pressure sensation in back/neck (temporary)  Call if you experience:  Fever/chills associated with headache or increased back/neck pain.  Headache worsened by an upright position.  New onset weakness or numbness of an extremity below the injection site  Hives or difficulty breathing (go to the emergency room)  Inflammation or drainage at the infection site  Severe back/neck pain  Any new symptoms which are concerning to you  Please note:  Although the local anesthetic injected can often make your back or neck feel good for several hours after the injection, the pain will likely return.  It takes 3-7 days for steroids to work in the epidural space.  You may not notice any pain relief for at least that one week.    If effective, we will often do a series of three injections spaced 3-6 weeks apart to maximally decrease your pain.  After the initial series, we generally will wait several months before considering a repeat injection of the same type.  If you have any questions, please call [phone] [facility]  Discharge Instructions  General Discharge Instructions :  If you need to reach your doctor call: Monday-Friday 8:00 am - 4:00 pm at [phone] or toll free [phone].  After clinic hours [phone] to have operator reach doctor.  Bring all of your medication bottles to all your appointments in the pain clinic.  To cancel or reschedule your appointment with Pain Management please remember to call 24 hours in advance to avoid a fee.  Refer to the educational materials which you have been given on: General Risks, I had my Procedure. Discharge Instructions, Post Sedation.  Post Procedure Instructions:  The drugs you were given will stay in your system until tomorrow, so for the next 24 hours you should not drive, make any legal decisions or drink any alcoholic beverages.  You may eat anything you prefer, but it is better to start with liquids then soups and crackers, and gradually work up to solid foods.  Please notify your doctor immediately if you have any unusual bleeding, trouble breathing or pain that is not related to your normal pain.  Depending on the type of procedure that was done, some parts of your body may feel week and/or numb.  This usually clears up by tonight or the next day.  Walk with the use of an assistive device or accompanied by an adult for the 24 hours.  You may use ice on the affected area for the first 24 hours.  Put ice in a Ziploc bag and cover with a towel and place against area 15 minutes on 15 minutes off.  You may switch to heat after 24 hours. 

## 2016-02-15 NOTE — Progress Notes (Signed)
Patient here today for lower back pain that is radiating into hips.  Patient would like procedure.  Patient reports having been dx with lymes disease back in April and is currently being treated.   Safety precautions to be maintained throughout the outpatient stay will include: orient to surroundings, keep bed in low position, maintain call bell within reach at all times, provide assistance with transfer out of bed and ambulation.

## 2016-02-16 ENCOUNTER — Telehealth: Payer: Self-pay

## 2016-02-16 NOTE — Telephone Encounter (Signed)
Post procedure phone call. States he is doing very well.

## 2016-02-16 NOTE — Progress Notes (Signed)
PROCEDURE PERFORMED: Lumbar epidural steroid injection under fluroscopic guidance with moderate sedation. L5-S1  CC:  Low back pain with radiation into the bilateral buttocks and some posterior bilateral leg pain  HPI:  Thomas [name] presents for reevaluation today. He reports today that he's had a recent exacerbation in his low back pain with left greater than right-sided pain with radiation into the hip and buttock region. This is consistent with the pain that he's previously experienced and he has had good success with previous epidural steroid injections. His last injection was in November 2016 which gave him relief up until this last month or so. Otherwise the quality characteristic condition region of his pain have been stable in nature with no significant changes noted. Bowel bladder function has been good.   Of note he has had a recent diagnosis of Lyme disease and has been on multiple antibiotics for coverage for this. He has been afebrile with no evidence of recent arthralgia related to Lyme disease and no other signs of systemic infection at this time.   Physical Exam:    PERRL, EOMI  Heart RRR   LCTA  Musculoskeletal: Bilateral paraspinous muscle tenderness in the low back. With pain on extension at the low back and positive straight leg raise left greater than right  Assessment: Chronic low back pain with degenerative disc disease and bilateral L5 sciatica that has recently worsened but has shown positive response to previous epidural steroid injection.  #2 facet arthropathy  #3 myofascial low back pain  #4 cervicalgia with cervical degenerative disc disease history  PLAN:   We'll proceed with a repeat epidural njection today with return to clinic on a when necessary basis.  2.  [name] has been instructed to continue follow-up with their primary care physician regarding their baseline medical care. We'll plan on a repeat epidural steroids injection at that time and he is to continue  with stretching strengthening exercises.     Procedure:  LESI:  NOTE: The risks, benefits, and expectations of the procedure have been discussed and explained to the patient who was understanding and in agreement with suggested treatment plan. No guarantees were made.  DESCRIPTION OF PROCEDURE: Lumbar epidural steroid injection with no Versed per patient request, EKG, blood pressure, pulse, and pulse oximetry monitoring. The procedure was performed with the patient in the prone position under fluoroscopic guidance. A local anesthetic skin wheal of 1.5% plain lidocaine was performed at the L5-S1 site after fluoroscopic identifictation  Using strict aseptic technique, I then advanced an 18-gauge Tuohy epidural needle in the midline via loss-of-resistance  Technique. There was negative aspiration for negative aspiration for heme or  CSF.  I then confirmed position with both AP and Lateral fluoroscan.  A total of 5 mL of Preservative-Free normal saline with 40 mg of Kenalog and 1cc Ropicaine 0.2 percent was injected incrementally via the  epidurally placed needle. Needle removed. The patient tolerated the injection well.   @Jalayah Gutridge  Andree Elk, MD@

## 2016-03-01 ENCOUNTER — Telehealth: Payer: Self-pay | Admitting: Family Medicine

## 2016-03-01 NOTE — Telephone Encounter (Signed)
Thomas [name] at [facility] aware of sig for 1% testosterone compound. Thomas [name] wants to know if they can increase to 5% and apply once a day. Thomas [name] and pt aware that Dr. Rosanna Randy is out of the office till Monday.

## 2016-03-01 NOTE — Telephone Encounter (Signed)
Thomas [name] with [facility] stated they received a written Rx for topical testosterone cream 1 % and since it doesn't come that way Thomas [name] stated they thought Dr. Rosanna Randy may have wanted it to be made as a compound. Thomas [name] would like a call back to discuss how to fill this medication. Thanks TNP

## 2016-03-04 NOTE — Telephone Encounter (Signed)
Jessica at [facility] advised as below.

## 2016-03-04 NOTE — Telephone Encounter (Signed)
Please review-aa 

## 2016-03-04 NOTE — Telephone Encounter (Signed)
yes

## 2016-03-22 ENCOUNTER — Ambulatory Visit (INDEPENDENT_AMBULATORY_CARE_PROVIDER_SITE_OTHER): Payer: Medicare Other | Admitting: Cardiovascular Disease

## 2016-03-22 ENCOUNTER — Encounter: Payer: Self-pay | Admitting: Cardiovascular Disease

## 2016-03-22 VITALS — BP 140/80 | HR 42 | Ht 67.5 in | Wt 174.2 lb

## 2016-03-22 DIAGNOSIS — R0789 Other chest pain: Secondary | ICD-10-CM

## 2016-03-22 DIAGNOSIS — E7800 Pure hypercholesterolemia, unspecified: Secondary | ICD-10-CM

## 2016-03-22 DIAGNOSIS — I10 Essential (primary) hypertension: Secondary | ICD-10-CM | POA: Diagnosis not present

## 2016-03-22 DIAGNOSIS — R002 Palpitations: Secondary | ICD-10-CM | POA: Diagnosis not present

## 2016-03-22 DIAGNOSIS — I2510 Atherosclerotic heart disease of native coronary artery without angina pectoris: Secondary | ICD-10-CM

## 2016-03-22 DIAGNOSIS — I441 Atrioventricular block, second degree: Secondary | ICD-10-CM

## 2016-03-22 NOTE — Patient Instructions (Signed)

## 2016-03-22 NOTE — Progress Notes (Signed)
Cardiology Office Note  Date:  03/22/2016   ID:  Thomas [name], DOB 10/25/1932, MRN [id number]  PCP:  Thomas Durie, MD   Chief Complaint  Patient presents with  . other     6 month fu. Pt c/o occassion BP elevation and chest tightness. Pt  Reviewed meds with pt verbally.    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 He does have a history of PVCs at nighttime and takes propranolol. Prior history of partial nephrectomy secondary to cancer History of chronic renal insufficiency, baseline creatinine 1.3 up to 1.7 He presents today for follow-up of his heart arrhythmia, chest pain Reports having diagnosis of Lyme disease. Sees a specialist at [facility] outside the system  In general he is doing well, active, continues to exercise Able to run 2-4 miles Blood pressure stable, Rarely takes hydralazine when necessary  Previous EKGs reviewed with him, October 2016 with normal sinus rhythm, second-degree AV block type I  EKG on today's visit shows normal sinus rhythm with what appears to be second-degree AV block, APCs in a bigeminal pattern  Rare Twinges of chest pain, sometimes happens when he starts running then goes away quickly Every 3 months, reports having episode of dull chest pain at rest,  Reports that he had chest pain today in waiting room  Other past medical history  labile pressures, occasionally blood pressure is very high  Takes propranolol at nighttime for palpitations   PMH:   has a past medical history of Aortic valve disorders; Hyperlipidemia; Hypertension; Lyme disease; and PVC's (premature ventricular contractions).  PSH:    Past Surgical History:  Procedure Laterality Date  . ANKLE SURGERY     right ankle fracture  . BIOPSY PROSTATE  2001 & 2003   x 2  . CARDIAC CATHETERIZATION  1990's  . HERNIA REPAIR  2012  . LAPAROSCOPIC ABLATION RENAL MASS    . PROSTATE SURGERY    . TONSILLECTOMY AND ADENOIDECTOMY      Current Outpatient Prescriptions  Medication Sig Dispense Refill  . Artemether-Lumefantrine (COARTEM PO) Take by mouth 2 (two) times daily.    Marland Kitchen aspirin 81 MG tablet Take by mouth.    Marland Kitchen atorvastatin (LIPITOR) 10 MG tablet TAKE ONE (1) TABLET EACH DAY 90 tablet 3  . co-enzyme Q-10 30 MG capsule Take 30 mg by mouth 3 (three) times daily.    Marland Kitchen COLCRYS 0.6 MG tablet Take 0.6 mg by mouth as needed.     . diclofenac sodium (VOLTAREN) 1 % GEL Place onto the skin.    Marland Kitchen doxycycline (VIBRA-TABS) 100 MG tablet Take 100 mg by mouth 2 (two) times daily.    Marland Kitchen dutasteride (AVODART) 0.5 MG capsule Take 1 mg by mouth as needed.     Marland Kitchen esomeprazole (NEXIUM) 40 MG capsule Take 40 mg by mouth daily as needed.     . febuxostat (ULORIC) 40 MG tablet Take 10 mg by mouth daily.    . furosemide (LASIX) 20 MG tablet Take 1 tablet (20 mg total) by mouth daily as needed. 30 tablet 11  . Garlic 10 MG CAPS Take by mouth.    . hydrALAZINE (APRESOLINE) 25 MG tablet Take 1 tablet (25 mg total) by mouth 3 (  three) times daily as needed. 90 tablet 6  . lisinopril (PRINIVIL,ZESTRIL) 5 MG tablet Take 1 tablet (5 mg total) by mouth daily. 90 tablet 3  . MULTIPLE VITAMIN PO Take by mouth.    . Omega-3 Fatty Acids (FISH OIL) 1000 MG CAPS Take 1,000 mg by mouth 1 dose over 24 hours.      . propranolol (INDERAL) 20 MG tablet Take 1 tablet (20 mg total) by mouth 3 (three) times daily as needed. 270 tablet 3  . Saccharomyces boulardii (PROBIOTIC) 250 MG CAPS Take by mouth.    . silodosin (RAPAFLO) 8 MG CAPS capsule Take by mouth as needed.     . temazepam (RESTORIL) 30 MG capsule Take by mouth at bedtime as needed.     . Testosterone (ANDROGEL PUMP) 20.25 MG/ACT (1.62%) GEL 4 pumps daily 225 g 5  . vitamin E 400  UNIT capsule Take 400 Units by mouth daily.     Current Facility-Administered Medications  Medication Dose Route Frequency Provider Last Rate Last Dose  . ropivacaine (PF) 2 mg/ml (0.2%) (NAROPIN) epidural 10 mL  10 mL Epidural Once Thomas Barrows, MD      . sodium chloride flush (NS) 0.9 % injection 10 mL  10 mL Other Once Thomas Barrows, MD      . triamcinolone acetonide (KENALOG-40) injection 40 mg  40 mg Other Once Thomas Barrows, MD         Allergies:   Iodine and Penicillins   Social History:  The patient  reports that he has never smoked. He has never used smokeless tobacco. He reports that he does not drink alcohol or use drugs.   Family History:   family history includes Aortic stenosis in his mother; Arthritis in his father; Heart attack in his brother.    Review of Systems: Review of Systems  Constitutional: Negative.   Respiratory: Negative.   Cardiovascular: Positive for chest pain and palpitations.  Gastrointestinal: Negative.   Musculoskeletal: Negative.   Neurological: Negative.   Psychiatric/Behavioral: Negative.   All other systems reviewed and are negative.    PHYSICAL EXAM: VS:  BP 140/80 (BP Location: Left Arm, Patient Position: Sitting, Cuff Size: Normal)   Pulse (!) 42   Ht 5' 7.5" (1.715 m)   Wt 174 lb 4 oz (79 kg)   BMI 26.89 kg/m  , BMI Body mass index is 26.89 kg/m. GEN: Well nourished, well developed, in no acute distress  HEENT: normal  Neck: no JVD, carotid bruits, or masses Cardiac: regularly irregular no murmurs, rubs, or gallops,no edema  Respiratory:  clear to auscultation bilaterally, normal work of breathing GI: soft, nontender, nondistended, + BS MS: no deformity or atrophy  Skin: warm and dry, no rash Neuro:  Strength and sensation are intact Psych: euthymic mood, full affect    Recent Labs: 08/04/2015: ALT 23; BUN 22; Creatinine 1.5; Hemoglobin 16.0; Platelets 186; Potassium 4.5; Sodium 139; TSH 2.16    Lipid Panel Lab Results   Component Value Date   CHOL 122 08/22/2014   HDL 56 08/22/2014   LDLCALC 56 08/22/2014   TRIG 50 08/22/2014      Wt Readings from Last 3 Encounters:  03/22/16 174 lb 4 oz (79 kg)  02/15/16 165 lb (74.8 kg)  02/08/16 172 lb (78 kg)       ASSESSMENT AND PLAN:  HYPERTENSION, BENIGN - Plan: EKG 12-Lead Blood pressure is well controlled on today's visit. No changes made to the medications. Lisinopril, propranolol  daily Takes hydralazine as needed for labile hypertension  Second degree AV block, Mobitz type I - Plan: EKG 12-Lead Long discussion with him today concerning recent findings Recommended we discussed his recent EKG with Dr. Caryl Comes He does have Lyme disease  Palpitations Rare palpitations, lasting short period time when he starts running then resolve Unclear if this is secondary to PVCs or secondary AV block We have offered 14 day monitor, he has declined at this time  Chest discomfort Atypical chest discomfort, occurs once every 3 months, occurring today at rest Otherwise able to exercise 4 miles running without symptoms Recommended if symptoms get worse, more frequent, last longer, would order stress test  Pure hypercholesterolemia Cholesterol is at goal on the current lipid regimen. No changes to the medications were made.  Coronary artery disease Atypical type chest pain, currently with few risk factors Cardiac catheterization many years ago with diagonal disease We have offered treadmill testing, treadmill stress echo, he has declined at this time   Total encounter time more than 25 minutes  Greater than 50% was spent in counseling and coordination of care with the patient  Disposition:   F/U  6 months   Orders Placed This Encounter  Procedures  . EKG 12-Lead     Signed, Esmond Plants, M.D., Ph.D. 03/22/2016  [place], [place]

## 2016-04-08 ENCOUNTER — Encounter: Payer: Self-pay | Admitting: Cardiovascular Disease

## 2016-05-24 ENCOUNTER — Telehealth: Payer: Self-pay | Admitting: Family Medicine

## 2016-05-24 NOTE — Telephone Encounter (Signed)
Called Pt to schedule AWV with [facility] - knb °

## 2016-06-03 ENCOUNTER — Other Ambulatory Visit: Payer: Self-pay | Admitting: Family Medicine

## 2016-06-03 NOTE — Telephone Encounter (Signed)
Please review-aa 

## 2016-06-03 NOTE — Telephone Encounter (Signed)
Per Office Note on 02/08/16 Pt was written an Rx for depo testosterone injection for once a month. Pt stated that he feels it needs to be written as twice a month. Pt request next Rx for twice a month be sent to [facility]. Please advise. Thanks TNP

## 2016-06-03 NOTE — Telephone Encounter (Signed)
Ok thx.

## 2016-06-03 NOTE — Telephone Encounter (Signed)
Lmtcb, need to know which dose, 100 mg/ml or 200 mg/ml-aa

## 2016-06-04 MED ORDER — TESTOSTERONE CYPIONATE 200 MG/ML IM SOLN
200.0000 mg | INTRAMUSCULAR | 5 refills | Status: DC
Start: 2016-06-04 — End: 2016-06-15

## 2016-06-04 NOTE — Telephone Encounter (Signed)
Patient states 200mg /ml. RX filled and faxed to the pharmacy-[facility]

## 2016-06-04 NOTE — Addendum Note (Signed)
Addended by: Arnette Norris on: 06/04/2016 11:21 AM   Modules accepted: Orders

## 2016-06-06 DIAGNOSIS — I442 Atrioventricular block, complete: Secondary | ICD-10-CM

## 2016-06-06 HISTORY — DX: Atrioventricular block, complete: I44.2

## 2016-06-14 ENCOUNTER — Observation Stay
Admission: EM | Admit: 2016-06-14 | Discharge: 2016-06-15 | Disposition: A | Discharge status: Home / Self Care | Payer: Medicare Other | Attending: Internal Medicine | Admitting: Internal Medicine

## 2016-06-14 ENCOUNTER — Encounter: Payer: Self-pay | Admitting: Intensive Care

## 2016-06-14 ENCOUNTER — Emergency Department: Payer: Medicare Other

## 2016-06-14 DIAGNOSIS — I739 Peripheral vascular disease, unspecified: Secondary | ICD-10-CM | POA: Insufficient documentation

## 2016-06-14 DIAGNOSIS — M546 Pain in thoracic spine: Secondary | ICD-10-CM

## 2016-06-14 DIAGNOSIS — Z8261 Family history of arthritis: Secondary | ICD-10-CM | POA: Insufficient documentation

## 2016-06-14 DIAGNOSIS — N400 Enlarged prostate without lower urinary tract symptoms: Secondary | ICD-10-CM | POA: Insufficient documentation

## 2016-06-14 DIAGNOSIS — I129 Hypertensive chronic kidney disease with stage 1 through stage 4 chronic kidney disease, or unspecified chronic kidney disease: Secondary | ICD-10-CM | POA: Diagnosis not present

## 2016-06-14 DIAGNOSIS — Z7982 Long term (current) use of aspirin: Secondary | ICD-10-CM | POA: Diagnosis not present

## 2016-06-14 DIAGNOSIS — Z880 Allergy status to penicillin: Secondary | ICD-10-CM | POA: Diagnosis not present

## 2016-06-14 DIAGNOSIS — A6920 Lyme disease, unspecified: Secondary | ICD-10-CM | POA: Insufficient documentation

## 2016-06-14 DIAGNOSIS — M1990 Unspecified osteoarthritis, unspecified site: Secondary | ICD-10-CM | POA: Insufficient documentation

## 2016-06-14 DIAGNOSIS — I4510 Unspecified right bundle-branch block: Secondary | ICD-10-CM | POA: Insufficient documentation

## 2016-06-14 DIAGNOSIS — I340 Nonrheumatic mitral (valve) insufficiency: Secondary | ICD-10-CM | POA: Insufficient documentation

## 2016-06-14 DIAGNOSIS — E785 Hyperlipidemia, unspecified: Secondary | ICD-10-CM | POA: Diagnosis not present

## 2016-06-14 DIAGNOSIS — G459 Transient cerebral ischemic attack, unspecified: Secondary | ICD-10-CM | POA: Diagnosis not present

## 2016-06-14 DIAGNOSIS — M109 Gout, unspecified: Secondary | ICD-10-CM | POA: Insufficient documentation

## 2016-06-14 DIAGNOSIS — Z8249 Family history of ischemic heart disease and other diseases of the circulatory system: Secondary | ICD-10-CM | POA: Insufficient documentation

## 2016-06-14 DIAGNOSIS — Z91041 Radiographic dye allergy status: Secondary | ICD-10-CM | POA: Diagnosis not present

## 2016-06-14 DIAGNOSIS — Z79899 Other long term (current) drug therapy: Secondary | ICD-10-CM | POA: Diagnosis not present

## 2016-06-14 DIAGNOSIS — I6502 Occlusion and stenosis of left vertebral artery: Secondary | ICD-10-CM

## 2016-06-14 DIAGNOSIS — I442 Atrioventricular block, complete: Secondary | ICD-10-CM

## 2016-06-14 DIAGNOSIS — K219 Gastro-esophageal reflux disease without esophagitis: Secondary | ICD-10-CM | POA: Insufficient documentation

## 2016-06-14 DIAGNOSIS — Z905 Acquired absence of kidney: Secondary | ICD-10-CM | POA: Insufficient documentation

## 2016-06-14 DIAGNOSIS — R5383 Other fatigue: Secondary | ICD-10-CM

## 2016-06-14 DIAGNOSIS — I6509 Occlusion and stenosis of unspecified vertebral artery: Secondary | ICD-10-CM | POA: Insufficient documentation

## 2016-06-14 DIAGNOSIS — Z8673 Personal history of transient ischemic attack (TIA), and cerebral infarction without residual deficits: Secondary | ICD-10-CM | POA: Diagnosis not present

## 2016-06-14 DIAGNOSIS — Z85528 Personal history of other malignant neoplasm of kidney: Secondary | ICD-10-CM | POA: Insufficient documentation

## 2016-06-14 DIAGNOSIS — N189 Chronic kidney disease, unspecified: Secondary | ICD-10-CM | POA: Insufficient documentation

## 2016-06-14 DIAGNOSIS — I951 Orthostatic hypotension: Secondary | ICD-10-CM

## 2016-06-14 HISTORY — DX: Transient cerebral ischemic attack, unspecified: G45.9

## 2016-06-14 HISTORY — DX: Conduction disorder, unspecified: I45.9

## 2016-06-14 LAB — URINALYSIS, ROUTINE W REFLEX MICROSCOPIC
BILIRUBIN URINE: NEGATIVE
GLUCOSE, UA: NEGATIVE mg/dL
HGB URINE DIPSTICK: NEGATIVE
Ketones, ur: NEGATIVE mg/dL
Leukocytes, UA: NEGATIVE
Nitrite: NEGATIVE
PROTEIN: NEGATIVE mg/dL
Specific Gravity, Urine: 1.003 — ABNORMAL LOW (ref 1.005–1.030)
pH: 5 (ref 5.0–8.0)

## 2016-06-14 LAB — DIFFERENTIAL
BASOS PCT: 1 %
Basophils Absolute: 0 10*3/uL (ref 0–0.1)
Eosinophils Absolute: 0.2 10*3/uL (ref 0–0.7)
Eosinophils Relative: 3 %
Lymphocytes Relative: 32 %
Lymphs Abs: 2.1 10*3/uL (ref 1.0–3.6)
MONO ABS: 0.9 10*3/uL (ref 0.2–1.0)
Monocytes Relative: 14 %
NEUTROS PCT: 50 %
Neutro Abs: 3.4 10*3/uL (ref 1.4–6.5)

## 2016-06-14 LAB — PROTIME-INR
INR: 1.01
PROTHROMBIN TIME: 13.3 s (ref 11.4–15.2)

## 2016-06-14 LAB — CBC
HCT: 47.8 % (ref 40.0–52.0)
Hemoglobin: 15.4 g/dL (ref 13.0–18.0)
MCH: 29.1 pg (ref 26.0–34.0)
MCHC: 32.2 g/dL (ref 32.0–36.0)
MCV: 90.3 fL (ref 80.0–100.0)
PLATELETS: 175 10*3/uL (ref 150–440)
RBC: 5.29 MIL/uL (ref 4.40–5.90)
RDW: 13.9 % (ref 11.5–14.5)
WBC: 6.6 10*3/uL (ref 3.8–10.6)

## 2016-06-14 LAB — COMPREHENSIVE METABOLIC PANEL
ALT: 18 U/L (ref 17–63)
ANION GAP: 5 (ref 5–15)
AST: 25 U/L (ref 15–41)
Albumin: 4.2 g/dL (ref 3.5–5.0)
Alkaline Phosphatase: 64 U/L (ref 38–126)
BUN: 22 mg/dL — ABNORMAL HIGH (ref 6–20)
CHLORIDE: 101 mmol/L (ref 101–111)
CO2: 28 mmol/L (ref 22–32)
CREATININE: 1.49 mg/dL — AB (ref 0.61–1.24)
Calcium: 9.7 mg/dL (ref 8.9–10.3)
GFR calc non Af Amer: 42 mL/min — ABNORMAL LOW (ref >60)
GFR, EST AFRICAN AMERICAN: 49 mL/min — AB (ref >60)
Glucose, Bld: 86 mg/dL (ref 65–99)
Potassium: 5.1 mmol/L (ref 3.5–5.1)
SODIUM: 134 mmol/L — AB (ref 135–145)
TOTAL PROTEIN: 7.1 g/dL (ref 6.5–8.1)
Total Bilirubin: 0.8 mg/dL (ref 0.3–1.2)

## 2016-06-14 LAB — LIPID PANEL
CHOL/HDL RATIO: 2.4 ratio
Cholesterol: 142 mg/dL (ref 0–200)
HDL: 59 mg/dL (ref >40)
LDL CALC: 75 mg/dL (ref 0–99)
TRIGLYCERIDES: 38 mg/dL (ref <150)
VLDL: 8 mg/dL (ref 0–40)

## 2016-06-14 LAB — GLUCOSE, CAPILLARY: GLUCOSE-CAPILLARY: 90 mg/dL (ref 65–99)

## 2016-06-14 LAB — APTT: aPTT: 27 seconds (ref 24–36)

## 2016-06-14 LAB — TROPONIN I: Troponin I: <0.03 ng/mL (ref <0.03)

## 2016-06-14 LAB — ETHANOL

## 2016-06-14 MED ORDER — ATORVASTATIN CALCIUM 20 MG PO TABS: 40.0000 mg | ORAL_TABLET | Freq: Every day | ORAL | Status: DC

## 2016-06-14 MED ORDER — ACETAMINOPHEN 325 MG PO TABS: 650.0000 mg | ORAL_TABLET | Freq: Four times a day (QID) | ORAL | Status: DC | PRN

## 2016-06-14 MED ORDER — SODIUM CHLORIDE 0.9% FLUSH
3.0000 mL | Freq: Two times a day (BID) | INTRAVENOUS | Status: DC
  Administered 2016-06-14 – 2016-06-15 (×2): 3 mL via INTRAVENOUS

## 2016-06-14 MED ORDER — PANTOPRAZOLE SODIUM 40 MG PO TBEC
40.0000 mg | DELAYED_RELEASE_TABLET | Freq: Every day | ORAL | Status: DC
Start: 2016-06-14 — End: 2016-06-15
  Administered 2016-06-15: 12:00:00 40 mg via ORAL
  Filled 2016-06-14: qty 1

## 2016-06-14 MED ORDER — TEMAZEPAM 7.5 MG PO CAPS: 30.0000 mg | ORAL_CAPSULE | Freq: Every evening | ORAL | Status: DC | PRN

## 2016-06-14 MED ORDER — RISAQUAD PO CAPS
1.0000 | ORAL_CAPSULE | Freq: Two times a day (BID) | ORAL | Status: DC
  Administered 2016-06-14 – 2016-06-15 (×2): 1 via ORAL
  Filled 2016-06-14 (×2): qty 1

## 2016-06-14 MED ORDER — ASPIRIN 81 MG PO CHEW
324.0000 mg | CHEWABLE_TABLET | Freq: Once | ORAL | Status: AC
  Administered 2016-06-14: 324 mg via ORAL
  Filled 2016-06-14: qty 4

## 2016-06-14 MED ORDER — DOXYCYCLINE HYCLATE 100 MG PO TABS
100.0000 mg | ORAL_TABLET | Freq: Two times a day (BID) | ORAL | Status: DC
  Administered 2016-06-14 – 2016-06-15 (×2): 100 mg via ORAL
  Filled 2016-06-14 (×3): qty 1

## 2016-06-14 MED ORDER — ASPIRIN EC 325 MG PO TBEC
325.0000 mg | DELAYED_RELEASE_TABLET | Freq: Every day | ORAL | Status: DC
  Administered 2016-06-15: 325 mg via ORAL
  Filled 2016-06-14: qty 1

## 2016-06-14 MED ORDER — ACETAMINOPHEN 650 MG RE SUPP: 650.0000 mg | Freq: Four times a day (QID) | RECTAL | Status: DC | PRN

## 2016-06-14 MED ORDER — DUTASTERIDE 0.5 MG PO CAPS
0.5000 mg | ORAL_CAPSULE | Freq: Every day | ORAL | Status: DC
  Administered 2016-06-15: 0.5 mg via ORAL
  Filled 2016-06-14 (×2): qty 1

## 2016-06-14 MED ORDER — OMEGA-3-ACID ETHYL ESTERS 1 G PO CAPS
1.0000 g | ORAL_CAPSULE | Freq: Every day | ORAL | Status: DC
  Administered 2016-06-15: 12:00:00 1 g via ORAL
  Filled 2016-06-14: qty 1

## 2016-06-14 MED ORDER — ENOXAPARIN SODIUM 40 MG/0.4ML ~~LOC~~ SOLN
40.0000 mg | SUBCUTANEOUS | Status: DC
  Administered 2016-06-14: 40 mg via SUBCUTANEOUS
  Filled 2016-06-14: qty 0.4

## 2016-06-14 MED ORDER — VITAMIN E 180 MG (400 UNIT) PO CAPS
400.0000 [IU] | ORAL_CAPSULE | Freq: Every day | ORAL | Status: DC
  Administered 2016-06-15: 12:00:00 400 [IU] via ORAL
  Filled 2016-06-14: qty 1

## 2016-06-14 MED ORDER — FEBUXOSTAT 40 MG PO TABS
20.0000 mg | ORAL_TABLET | Freq: Every day | ORAL | Status: DC
  Administered 2016-06-15: 12:00:00 20 mg via ORAL
  Filled 2016-06-14: qty 1

## 2016-06-14 NOTE — ED Provider Notes (Signed)
[facility] Emergency Department Provider Note        Time seen: ----------------------------------------- 1:52 PM on 06/14/2016 -----------------------------------------    I have reviewed the triage vital signs and the nursing notes.   HISTORY  Chief Complaint Code Stroke    HPI Thomas BOOHER is a 83 y.o. male who presents to the ER fornumbness to the left side of his face and left arm that began about 1 hour ago. Patient has not had speech or vision trouble. He has not had any facial droop according to the triage nurse. He has not had any leg involvement, only his left face and left arm. He denies any cranial nerve deficits or other complaints.   Past Medical History:  Diagnosis Date  . Aortic valve disorders   . Hyperlipidemia   . Hypertension   . Lyme disease   . PVC's (premature ventricular contractions)     Patient Active Problem List   Diagnosis Date Noted  . Second degree AV block, Mobitz type I 02/28/2015  . Arthritis 02/06/2015  . Esophageal reflux 02/06/2015  . Arthritis urica 02/06/2015  . Cannot sleep 02/06/2015  . Arthritis, degenerative 02/06/2015  . Adenocarcinoma, renal cell ([place]) 02/06/2015  . Benign prostatic hyperplasia with urinary obstruction 11/21/2014  . Personal history of other malignant neoplasm of kidney 11/21/2014  . Palpitations 12/31/2013  . Hyperlipidemia 11/02/2010  . HYPERTENSION, BENIGN 04/16/2010    Past Surgical History:  Procedure Laterality Date  . ANKLE SURGERY     right ankle fracture  . BIOPSY PROSTATE  2001 & 2003   x 2  . CARDIAC CATHETERIZATION  1990's  . HERNIA REPAIR  2012  . LAPAROSCOPIC ABLATION RENAL MASS    . PROSTATE SURGERY    . TONSILLECTOMY AND ADENOIDECTOMY      Allergies Iodine and Penicillins  Social History Social History  Substance Use Topics  . Smoking status: Never Smoker  . Smokeless tobacco: Never Used  . Alcohol use No    Review of Systems Constitutional:  Negative for fever. Cardiovascular: Negative for chest pain. Respiratory: Negative for shortness of breath. Gastrointestinal: Negative for abdominal pain, vomiting and diarrhea. Genitourinary: Negative for dysuria. Musculoskeletal: Negative for back pain. Skin: Negative for rash. Neurological: Negative for headaches,Positive for left-sided numbness  10-point ROS otherwise negative.  ____________________________________________   PHYSICAL EXAM:  VITAL SIGNS: ED Triage Vitals  Enc Vitals Group     BP      Pulse      Resp      Temp      Temp src      SpO2      Weight      Height      Head Circumference      Peak Flow      Pain Score      Pain Loc      Pain Edu?      Excl. in [place]?     Constitutional: Alert and oriented. Well appearing and in no distress. Eyes: Conjunctivae are normal. PERRL. Normal extraocular movements. ENT   Head: Normocephalic and atraumatic.   Nose: No congestion/rhinnorhea.   Mouth/Throat: Mucous membranes are moist.   Neck: No stridor. Cardiovascular: Slow rate, irregular rhythm, No murmurs, rubs, or gallops. Respiratory: Normal respiratory effort without tachypnea nor retractions. Breath sounds are clear and equal bilaterally. No wheezes/rales/rhonchi. Gastrointestinal: Soft and nontender. Normal bowel sounds Musculoskeletal: Nontender with normal range of motion in all extremities. No lower extremity tenderness nor edema. Neurologic:  Normal speech and language. No gross focal neurologic deficits are appreciated. Slight paresthesias noted in the left arm and left face compared to right. Cranial nerves are intact, normal strength in the upper and lower extremities Skin:  Skin is warm, dry and intact. No rash noted. Psychiatric: Mood and affect are normal. Speech and behavior are normal.  ____________________________________________  EKG: Interpreted by me.Wide complex QRS with a rate of 41 bpm, complete AV block, right bundle branch  block pattern  ____________________________________________  ED COURSE:  Pertinent labs & imaging results that were available during my care of the patient were reviewed by me and considered in my medical decision making (see chart for details). Patient presents to ER with strokelike symptoms that now appear to be resolving more indicative of TIA. We will assess with labs and imaging.   Procedures ____________________________________________   LABS (pertinent positives/negatives)  Labs Reviewed  CBC  DIFFERENTIAL  GLUCOSE, CAPILLARY  ETHANOL  PROTIME-INR  APTT  COMPREHENSIVE METABOLIC PANEL  URINALYSIS, ROUTINE W REFLEX MICROSCOPIC  TROPONIN I  I-STAT CHEM 8, ED   CRITICAL CARE Performed by: Earleen Newport   Total critical care time: 30 minutes  Critical care time was exclusive of separately billable procedures and treating other patients.  Critical care was necessary to treat or prevent imminent or life-threatening deterioration.  Critical care was time spent personally by me on the following activities: development of treatment plan with patient and/or surrogate as well as nursing, discussions with consultants, evaluation of patient's response to treatment, examination of patient, obtaining history from patient or surrogate, ordering and performing treatments and interventions, ordering and review of laboratory studies, ordering and review of radiographic studies, pulse oximetry and re-evaluation of patient's condition.  RADIOLOGY Images were viewed by me  CT head IMPRESSION: No acute intracranial abnormality.  Findings called to Dr. Clearnce Hasten ____________________________________________  FINAL ASSESSMENT AND PLAN  TIA, complete heart block  Plan: Patient with labs and imaging as dictated above. Patient presented to the ER with TIA symptoms affecting the left face and left arm. No specific strain component. He was found to be in complete AV block. Patient  states he's had known heart block for some time. This is a possible etiology for his symptoms. I will discuss with cardiology and patient will be admitted by the hospitalist service. Neurology has seen him in the ER and agrees with the diagnosis of TIA. He has been started on aspirin.   Earleen Newport, MD   Note: This note was generated in part or whole with voice recognition software. Voice recognition is usually quite accurate but there are transcription errors that can and very often do occur. I apologize for any typographical errors that were not detected and corrected.     Earleen Newport, MD 06/14/16 [phone]

## 2016-06-14 NOTE — ED Triage Notes (Signed)
Patient arrived by POV for c/o numbness to L side of face and L arm at 1300. Speech clear. No facial droop noted. Pt ambulatory with NAD noted and denies weakness. A&O x3

## 2016-06-14 NOTE — H&P (Signed)
[facility] NAME: Thomas [name]    MR#:  [id number]  DATE OF BIRTH:  [birth year]  DATE OF ADMISSION:  06/14/2016  PRIMARY CARE PHYSICIAN: [name], MD   REQUESTING/REFERRING PHYSICIAN: Dr. Lenise Arena  CHIEF COMPLAINT:   Chief Complaint  Patient presents with  . Code Stroke    HISTORY OF PRESENT ILLNESS:  Thomas [name]  is a 83 y.o. male with a known history of Hypertension, hyperlipidemia, adequate without stenosis, Lyme's disease, history of type II heart block) from home secondary to left arm numbness. Sudden onset of left arm numbness that started this afternoon around 1 PM, however symptoms resolved by the time he had come to the emergency room. That's why he was not given any TPA. Denies any changes in his strength, no speech changes, no facial droop. EKG with complete heart block- which is chronic per cardiology. Will be admitted for TIA work up. Feels better now.  CT of the head negative for any acute findings.  PAST MEDICAL HISTORY:   Past Medical History:  Diagnosis Date  . Aortic valve disorders   . Heart block   . Hyperlipidemia   . Hypertension   . Lyme disease   . PVC's (premature ventricular contractions)     PAST SURGICAL HISTORY:   Past Surgical History:  Procedure Laterality Date  . ANKLE SURGERY     right ankle fracture  . BIOPSY PROSTATE  2001 & 2003   x 2  . CARDIAC CATHETERIZATION  1990's  . HERNIA REPAIR  2012  . LAPAROSCOPIC ABLATION RENAL MASS    . PROSTATE SURGERY    . TONSILLECTOMY AND ADENOIDECTOMY      SOCIAL HISTORY:   Social History  Substance Use Topics  . Smoking status: Never Smoker  . Smokeless tobacco: Never Used  . Alcohol use 1.2 oz/week    1 Glasses of wine, 1 Cans of beer per week    FAMILY HISTORY:   Family History  Problem Relation Age of Onset  . Heart attack Brother   . Aortic stenosis Mother   . Arthritis Father     DRUG ALLERGIES:    Allergies  Allergen Reactions  . Iodine     IVP contrast  . Penicillins     REVIEW OF SYSTEMS:   Review of Systems  Constitutional: Negative for chills, fever, malaise/fatigue and weight loss.  HENT: Negative for ear discharge, ear pain, hearing loss, nosebleeds and tinnitus.   Eyes: Negative for blurred vision, double vision and photophobia.  Respiratory: Negative for cough, hemoptysis, shortness of breath and wheezing.   Cardiovascular: Negative for chest pain, palpitations, orthopnea and leg swelling.  Gastrointestinal: Negative for abdominal pain, constipation, diarrhea, heartburn, melena, nausea and vomiting.  Genitourinary: Negative for dysuria, frequency, hematuria and urgency.  Musculoskeletal: Negative for back pain, myalgias and neck pain.  Skin: Negative for rash.  Neurological: Positive for sensory change. Negative for dizziness, tremors, speech change, focal weakness and headaches.  Endo/Heme/Allergies: Does not bruise/bleed easily.  Psychiatric/Behavioral: Negative for depression.    MEDICATIONS AT HOME:   Prior to Admission medications   Medication Sig Start Date End Date Taking? Authorizing Provider  Artemether-Lumefantrine (COARTEM PO) Take by mouth 2 (two) times daily.   Yes Historical Provider, MD  aspirin 81 MG tablet Take by mouth. 07/24/12  Yes Historical Provider, MD  atorvastatin (LIPITOR) 10 MG tablet TAKE ONE (1) TABLET EACH DAY 12/04/15  Yes Richard Maceo Pro.,  MD  co-enzyme Q-10 30 MG capsule Take 30 mg by mouth 3 (three) times daily.   Yes Historical Provider, MD  COLCRYS 0.6 MG tablet Take 0.6 mg by mouth as needed.  10/07/12  Yes Historical Provider, MD  diclofenac sodium (VOLTAREN) 1 % GEL Place onto the skin. 01/20/14  Yes Historical Provider, MD  doxycycline (VIBRA-TABS) 100 MG tablet Take 100 mg by mouth 2 (two) times daily.   Yes Historical Provider, MD  dutasteride (AVODART) 0.5 MG capsule Take 1 mg by mouth as needed.    Yes Historical  Provider, MD  esomeprazole (NEXIUM) 40 MG capsule Take 40 mg by mouth daily as needed.    Yes Historical Provider, MD  febuxostat (ULORIC) 40 MG tablet Take 10 mg by mouth daily.   Yes Historical Provider, MD  furosemide (LASIX) 20 MG tablet Take 1 tablet (20 mg total) by mouth daily as needed. 12/31/13  Yes Minna Merritts, MD  Garlic 10 MG CAPS Take by mouth.   Yes Historical Provider, MD  hydrALAZINE (APRESOLINE) 25 MG tablet Take 1 tablet (25 mg total) by mouth 3 (three) times daily as needed. 09/14/15  Yes Minna Merritts, MD  lisinopril (PRINIVIL,ZESTRIL) 5 MG tablet Take 1 tablet (5 mg total) by mouth daily. 12/05/15  Yes Minna Merritts, MD  MULTIPLE VITAMIN PO Take by mouth. 07/24/12  Yes Historical Provider, MD  Omega-3 Fatty Acids (FISH OIL) 1000 MG CAPS Take 1,000 mg by mouth 1 dose over 24 hours.     Yes Historical Provider, MD  propranolol (INDERAL) 20 MG tablet Take 1 tablet (20 mg total) by mouth 3 (three) times daily as needed. Patient taking differently: Take 20 mg by mouth at bedtime.  01/29/16  Yes Minna Merritts, MD  Saccharomyces boulardii (PROBIOTIC) 250 MG CAPS Take by mouth.   Yes Historical Provider, MD  silodosin (RAPAFLO) 8 MG CAPS capsule Take by mouth as needed.  07/24/12  Yes Historical Provider, MD  Testosterone (ANDROGEL PUMP) 20.25 MG/ACT (1.62%) GEL 4 pumps daily 01/17/16  Yes Birdie Sons, MD  vitamin E 400 UNIT capsule Take 400 Units by mouth daily.   Yes Historical Provider, MD  temazepam (RESTORIL) 30 MG capsule Take by mouth at bedtime as needed.  01/20/14   Historical Provider, MD  testosterone cypionate (DEPO-TESTOSTERONE) 200 MG/ML injection Inject 1 mL (200 mg total) into the muscle every 14 (fourteen) days. Patient not taking: Reported on 06/14/2016 06/04/16   Jerrol Banana., MD      VITAL SIGNS:  Blood pressure (!) 151/74, pulse (!) 41, temperature 97.5 F (36.4 C), resp. rate 18, height 5' 7.5" (1.715 m), weight 74.8 kg (165 lb), SpO2 99  %.  PHYSICAL EXAMINATION:   Physical Exam  GENERAL:  83 y.o.-year-old patient lying in the bed with no acute distress.  EYES: Pupils equal, round, reactive to light and accommodation. No scleral icterus. Extraocular muscles intact.  HEENT: Head atraumatic, normocephalic. Oropharynx and nasopharynx clear.  NECK:  Supple, no jugular venous distention. No thyroid enlargement, no tenderness.  LUNGS: Normal breath sounds bilaterally, no wheezing, rales,rhonchi or crepitation. No use of accessory muscles of respiration.  CARDIOVASCULAR: S1, S2 normal. No murmurs, rubs, or gallops.  ABDOMEN: Soft, nontender, nondistended. Bowel sounds present. No organomegaly or mass.  EXTREMITIES: No pedal edema, cyanosis, or clubbing.  NEUROLOGIC: Cranial nerves II through XII are intact. Muscle strength 5/5 in all extremities. Sensation intact. Gait not checked. 2+ bilateral knee jerks and biceps reflexes  PSYCHIATRIC: The patient is alert and oriented x 3.  SKIN: No obvious rash, lesion, or ulcer.   LABORATORY PANEL:   CBC  Recent Labs Lab 06/14/16 1346  WBC 6.6  HGB 15.4  HCT 47.8  PLT 175   ------------------------------------------------------------------------------------------------------------------  Chemistries   Recent Labs Lab 06/14/16 1346  NA 134*  K 5.1  CL 101  CO2 28  GLUCOSE 86  BUN 22*  CREATININE 1.49*  CALCIUM 9.7  AST 25  ALT 18  ALKPHOS 64  BILITOT 0.8   ------------------------------------------------------------------------------------------------------------------  Cardiac Enzymes  Recent Labs Lab 06/14/16 1346  TROPONINI <0.03   ------------------------------------------------------------------------------------------------------------------  RADIOLOGY:  Ct Head Wo Contrast  Result Date: 06/14/2016 CLINICAL DATA:  Dizziness.  Headache and extremity weakness. EXAM: CT HEAD WITHOUT CONTRAST TECHNIQUE: Contiguous axial images were obtained from the base  of the skull through the vertex without intravenous contrast. COMPARISON:  None. FINDINGS: Brain: No subdural, epidural, or subarachnoid hemorrhage. Cerebellum, brainstem, and basal cisterns are normal. Ventricles and sulci are unremarkable for age. Mild scattered white matter changes. No acute cortical ischemia or infarct. No mass, mass effect, or midline shift. Vascular: Calcified atherosclerosis in the intracranial carotid arteries. Skull: Normal. Negative for fracture or focal lesion. Sinuses/Orbits: No acute finding. Other: None. IMPRESSION: No acute intracranial abnormality. Findings called to Dr. Clearnce Hasten Electronically Signed   By: Dorise Bullion III M.D   On: 06/14/2016 13:53    EKG:   Orders placed or performed during the hospital encounter of 06/14/16  . ED EKG  . ED EKG    IMPRESSION AND PLAN:   Thomas [name]  is a 83 y.o. male with a known history of Hypertension, hyperlipidemia, adequate without stenosis, Lyme's disease, history of type II heart block) from home secondary to left arm numbness.  #1 TIA- with left arm numbness, resolved now Admit under obs, tele - neuro checks, neuro consult, MRI/MRA of brain - dopplers, ECHO, started aspirin as patient was not consistently taking it every day - statin dose increased - PT, OT and speech therapy consults  #2 complete heart block-chronic according to patient and also cardiology.  - had type II heart block in past noted, now in complete heart block and not symptomatic otherwise. Not sure if TIA symptoms are related to that. -We'll get cardiology consult for the same. - hold propranolol, monitor on telemetry  #3 Lymes disease- continue long term doxycycline and also oral probiotics  #4 GERD- protonix  #5 Gout- uloric  #6 DVT Prophylaxis- lovenox    All the records are reviewed and case discussed with ED provider. Management plans discussed with the patient, family and they are in agreement.  CODE STATUS: Full  Code  TOTAL TIME TAKING CARE OF THIS PATIENT: 50 minutes.    Maurianna Benard M.D on 06/14/2016 at 4:05 PM  Between 7am to 6pm - Pager - [phone]  After 6pm go to [email or web address] - password [email or web address]  Office  [phone]  CC: Primary care physician; [name], MD

## 2016-06-14 NOTE — Consult Note (Signed)
Referring Physician: Jimmye Norman    Chief Complaint: Left sided numbness  HPI: Thomas [name] is an 83 y.o. male who reports that today he had acute onset of numbness in his left hand that travelled up his arm and into the left side of his face.  With no quick resolution of his symptoms patient presented for evaluation.  Initial NIHSS of 1 with symptoms improving.    Date last known well: Date: 06/14/2016 Time last known well: Time: 13:00 tPA Given: No: Resolution of symptoms  Past Medical History:  Diagnosis Date  . Aortic valve disorders   . Hyperlipidemia   . Hypertension   . Lyme disease   . PVC's (premature ventricular contractions)     Past Surgical History:  Procedure Laterality Date  . ANKLE SURGERY     right ankle fracture  . BIOPSY PROSTATE  2001 & 2003   x 2  . CARDIAC CATHETERIZATION  1990's  . HERNIA REPAIR  2012  . LAPAROSCOPIC ABLATION RENAL MASS    . PROSTATE SURGERY    . TONSILLECTOMY AND ADENOIDECTOMY      Family History  Problem Relation Age of Onset  . Heart attack Brother   . Aortic stenosis Mother   . Arthritis Father    Social History:  reports that he has never smoked. He has never used smokeless tobacco. He reports that he drinks about 1.2 oz of alcohol per week . He reports that he does not use drugs.  Allergies:  Allergies  Allergen Reactions  . Iodine     IVP contrast  . Penicillins     Medications: I have reviewed the patient's current medications. Prior to Admission:  Prior to Admission medications   Medication Sig Start Date End Date Taking? Authorizing Provider  Artemether-Lumefantrine (COARTEM PO) Take by mouth 2 (two) times daily.    Historical Provider, MD  aspirin 81 MG tablet Take by mouth. 07/24/12   Historical Provider, MD  atorvastatin (LIPITOR) 10 MG tablet TAKE ONE (1) TABLET EACH DAY 12/04/15   Richard Maceo Pro., MD  co-enzyme Q-10 30 MG capsule Take 30 mg by mouth 3 (three) times daily.    Historical Provider, MD   COLCRYS 0.6 MG tablet Take 0.6 mg by mouth as needed.  10/07/12   Historical Provider, MD  diclofenac sodium (VOLTAREN) 1 % GEL Place onto the skin. 01/20/14   Historical Provider, MD  doxycycline (VIBRA-TABS) 100 MG tablet Take 100 mg by mouth 2 (two) times daily.    Historical Provider, MD  dutasteride (AVODART) 0.5 MG capsule Take 1 mg by mouth as needed.     Historical Provider, MD  esomeprazole (NEXIUM) 40 MG capsule Take 40 mg by mouth daily as needed.     Historical Provider, MD  febuxostat (ULORIC) 40 MG tablet Take 10 mg by mouth daily.    Historical Provider, MD  furosemide (LASIX) 20 MG tablet Take 1 tablet (20 mg total) by mouth daily as needed. 12/31/13   Minna Merritts, MD  Garlic 10 MG CAPS Take by mouth.    Historical Provider, MD  hydrALAZINE (APRESOLINE) 25 MG tablet Take 1 tablet (25 mg total) by mouth 3 (three) times daily as needed. 09/14/15   Minna Merritts, MD  lisinopril (PRINIVIL,ZESTRIL) 5 MG tablet Take 1 tablet (5 mg total) by mouth daily. 12/05/15   Minna Merritts, MD  MULTIPLE VITAMIN PO Take by mouth. 07/24/12   Historical Provider, MD  Omega-3 Fatty Acids (FISH OIL) 1000  MG CAPS Take 1,000 mg by mouth 1 dose over 24 hours.      Historical Provider, MD  propranolol (INDERAL) 20 MG tablet Take 1 tablet (20 mg total) by mouth 3 (three) times daily as needed. 01/29/16   Minna Merritts, MD  Saccharomyces boulardii (PROBIOTIC) 250 MG CAPS Take by mouth.    Historical Provider, MD  silodosin (RAPAFLO) 8 MG CAPS capsule Take by mouth as needed.  07/24/12   Historical Provider, MD  temazepam (RESTORIL) 30 MG capsule Take by mouth at bedtime as needed.  01/20/14   Historical Provider, MD  Testosterone (ANDROGEL PUMP) 20.25 MG/ACT (1.62%) GEL 4 pumps daily 01/17/16   Birdie Sons, MD  testosterone cypionate (DEPO-TESTOSTERONE) 200 MG/ML injection Inject 1 mL (200 mg total) into the muscle every 14 (fourteen) days. 06/04/16   Jerrol Banana., MD  vitamin E 400 UNIT capsule  Take 400 Units by mouth daily.    Historical Provider, MD   Patient not taking ASA.  ROS: History obtained from the patient  General ROS: negative for - chills, fatigue, fever, night sweats, weight gain or weight loss Psychological ROS: negative for - behavioral disorder, hallucinations, memory difficulties, mood swings or suicidal ideation Ophthalmic ROS: negative for - blurry vision, double vision, eye pain or loss of vision ENT ROS: negative for - epistaxis, nasal discharge, oral lesions, sore throat, tinnitus or vertigo Allergy and Immunology ROS: negative for - hives or itchy/watery eyes Hematological and Lymphatic ROS: negative for - bleeding problems, bruising or swollen lymph nodes Endocrine ROS: negative for - galactorrhea, hair pattern changes, polydipsia/polyuria or temperature intolerance Respiratory ROS: negative for - cough, hemoptysis, shortness of breath or wheezing Cardiovascular ROS: negative for - chest pain, dyspnea on exertion, edema or irregular heartbeat Gastrointestinal ROS: negative for - abdominal pain, diarrhea, hematemesis, nausea/vomiting or stool incontinence Genito-Urinary ROS: negative for - dysuria, hematuria, incontinence or urinary frequency/urgency Musculoskeletal ROS: negative for - joint swelling or muscular weakness Neurological ROS: as noted in HPI Dermatological ROS: negative for rash and skin lesion changes  Physical Examination: Blood pressure (!) 156/74, pulse 68, resp. rate 15, height 5' 7.5" (1.715 m), weight 74.8 kg (165 lb), SpO2 100 %.  HEENT-  Normocephalic, no lesions, without obvious abnormality.  Normal external eye and conjunctiva.  Normal TM's bilaterally.  Normal auditory canals and external ears. Normal external nose, mucus membranes and septum.  Normal pharynx. Cardiovascular- S1, S2 normal, pulses palpable throughout   Lungs- chest clear, no wheezing, rales, normal symmetric air entry Abdomen- soft, non-tender; bowel sounds  normal; no masses,  no organomegaly Extremities- no edema Lymph-no adenopathy palpable Musculoskeletal-no joint tenderness, deformity or swelling Skin-warm and dry, no hyperpigmentation, vitiligo, or suspicious lesions  Neurological Examination   Mental Status: Alert, oriented, thought content appropriate.  Speech fluent without evidence of aphasia.  Able to follow 3 step commands without difficulty. Cranial Nerves: II: Discs flat bilaterally; Visual fields grossly normal, pupils equal, round, reactive to light and accommodation III,IV, VI: ptosis not present, extra-ocular motions intact bilaterally V,VII: smile symmetric, mild alteration of sensation on the left side of the face VIII: hearing normal bilaterally IX,X: gag reflex present XI: bilateral shoulder shrug XII: midline tongue extension Motor: Right : Upper extremity   5/5    Left:     Upper extremity   5/5  Lower extremity   5/5     Lower extremity   5/5 Tone and bulk:normal tone throughout; no atrophy noted Sensory: Pinprick and light  touch intact throughout, bilaterally Deep Tendon Reflexes: 2+ and symmetric throughout Plantars: Right: mute   Left: mute Cerebellar: Normal finger-to-nose and normal heel-to-shin testing bilaterally Gait: not tested due to safety concerns    Laboratory Studies:  Basic Metabolic Panel: No results for input(s): NA, K, CL, CO2, GLUCOSE, BUN, CREATININE, CALCIUM, MG, PHOS in the last 168 hours.  Liver Function Tests: No results for input(s): AST, ALT, ALKPHOS, BILITOT, PROT, ALBUMIN in the last 168 hours. No results for input(s): LIPASE, AMYLASE in the last 168 hours. No results for input(s): AMMONIA in the last 168 hours.  CBC:  Recent Labs Lab 06/14/16 1346  WBC 6.6  NEUTROABS 3.4  HGB 15.4  HCT 47.8  MCV 90.3  PLT 175    Cardiac Enzymes: No results for input(s): CKTOTAL, CKMB, CKMBINDEX, TROPONINI in the last 168 hours.  BNP: Invalid input(s): POCBNP  CBG:  Recent  Labs Lab 06/14/16 1355  GLUCAP 90    Microbiology: No results found for this or any previous visit.  Coagulation Studies: No results for input(s): LABPROT, INR in the last 72 hours.  Urinalysis: No results for input(s): COLORURINE, LABSPEC, PHURINE, GLUCOSEU, HGBUR, BILIRUBINUR, KETONESUR, PROTEINUR, UROBILINOGEN, NITRITE, LEUKOCYTESUR in the last 168 hours.  Invalid input(s): APPERANCEUR  Lipid Panel:    Component Value Date/Time   CHOL 122 08/22/2014   CHOL 144 06/26/2012 1106   TRIG 50 08/22/2014   TRIG 46 06/26/2012 1106   HDL 56 08/22/2014   HDL 61 (H) 06/26/2012 1106   VLDL 9 06/26/2012 1106   LDLCALC 56 08/22/2014   LDLCALC 74 06/26/2012 1106    HgbA1C: No results found for: HGBA1C  Urine Drug Screen:  No results found for: LABOPIA, COCAINSCRNUR, LABBENZ, AMPHETMU, THCU, LABBARB  Alcohol Level: No results for input(s): ETH in the last 168 hours.  Other results: EKG: bradycardia  Imaging: Ct Head Wo Contrast  Result Date: 06/14/2016 CLINICAL DATA:  Dizziness.  Headache and extremity weakness. EXAM: CT HEAD WITHOUT CONTRAST TECHNIQUE: Contiguous axial images were obtained from the base of the skull through the vertex without intravenous contrast. COMPARISON:  None. FINDINGS: Brain: No subdural, epidural, or subarachnoid hemorrhage. Cerebellum, brainstem, and basal cisterns are normal. Ventricles and sulci are unremarkable for age. Mild scattered white matter changes. No acute cortical ischemia or infarct. No mass, mass effect, or midline shift. Vascular: Calcified atherosclerosis in the intracranial carotid arteries. Skull: Normal. Negative for fracture or focal lesion. Sinuses/Orbits: No acute finding. Other: None. IMPRESSION: No acute intracranial abnormality. Findings called to Dr. Clearnce Hasten Electronically Signed   By: Dorise Bullion III M.D   On: 06/14/2016 13:53    Assessment: 83 y.o. male presenting with left sided numbness.  Symptoms improved and therefore  patient not a tPA candidate.  Head CT reviewed and shows no acute changes.  Concern is for TIA.  Further work up recommended.  Patient not compliants with ASA.    Stroke Risk Factors - hyperlipidemia and hypertension  Plan: 1. HgbA1c, fasting lipid panel 2. MRI, MRA  of the brain without contrast 3. PT consult, OT consult, Speech consult 4. Echocardiogram 5. Carotid dopplers 6. Prophylactic therapy-Antiplatelet med: Aspirin - dose 325mg  daily 7. NPO until RN stroke swallow screen 8. Telemetry monitoring 9. Frequent neuro checks  Case discussed with Dr. Simon Rhein, MD Neurology [phone] 06/14/2016, 2:18 PM

## 2016-06-15 ENCOUNTER — Observation Stay: Payer: Medicare Other

## 2016-06-15 ENCOUNTER — Observation Stay (HOSPITAL_BASED_OUTPATIENT_CLINIC_OR_DEPARTMENT_OTHER)
Admit: 2016-06-15 | Discharge: 2016-06-15 | Disposition: A | Discharge status: Home / Self Care | Payer: Medicare Other | Attending: Internal Medicine | Admitting: Internal Medicine

## 2016-06-15 DIAGNOSIS — E7800 Pure hypercholesterolemia, unspecified: Secondary | ICD-10-CM | POA: Diagnosis not present

## 2016-06-15 DIAGNOSIS — I442 Atrioventricular block, complete: Secondary | ICD-10-CM | POA: Diagnosis not present

## 2016-06-15 DIAGNOSIS — I441 Atrioventricular block, second degree: Secondary | ICD-10-CM

## 2016-06-15 DIAGNOSIS — I951 Orthostatic hypotension: Secondary | ICD-10-CM

## 2016-06-15 DIAGNOSIS — M546 Pain in thoracic spine: Secondary | ICD-10-CM | POA: Diagnosis not present

## 2016-06-15 DIAGNOSIS — G459 Transient cerebral ischemic attack, unspecified: Secondary | ICD-10-CM | POA: Diagnosis not present

## 2016-06-15 DIAGNOSIS — I6502 Occlusion and stenosis of left vertebral artery: Secondary | ICD-10-CM

## 2016-06-15 DIAGNOSIS — I6509 Occlusion and stenosis of unspecified vertebral artery: Secondary | ICD-10-CM

## 2016-06-15 DIAGNOSIS — R5383 Other fatigue: Secondary | ICD-10-CM

## 2016-06-15 LAB — HEMOGLOBIN A1C
Hgb A1c MFr Bld: 5.5 % (ref 4.8–5.6)
MEAN PLASMA GLUCOSE: 111 mg/dL

## 2016-06-15 MED ORDER — ATORVASTATIN CALCIUM 40 MG PO TABS: 40.0000 mg | ORAL_TABLET | Freq: Every day | ORAL | 2 refills | Status: DC

## 2016-06-15 NOTE — Progress Notes (Signed)
Pt being discharged today, Iv x1 removed. All belongings packed and returned to patient. Discharge instructions reviewed with patient, he verified understanding. 0 paper prescriptions were given to patient. Pt will be rolled out in wheelchair by staff.

## 2016-06-15 NOTE — Consult Note (Addendum)
Cardiology Consultation Note  Patient ID: Thomas [name], MRN: [id number], DOB/AGE: 06-Oct-1932 83 y.o. Admit date: 06/14/2016   Date of Consult: 06/15/2016 Primary Physician: [name], MD Primary Cardiologist: Rockey Situ  Chief Complaint:Numbness left side of face, left arm Reason for Consult: Possible stroke, heart arrhythmia Physician requesting consult: Tressia Miners   HPI: 83 y.o. 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 partial nephrectomy secondary to cancer chronic renal insufficiency, baseline creatinine 1.3 up to 1.7 Prior episodes of atypical chest pain, Self reported diagnosis of Lyme disease. Sees a specialist at [facility] outside the system  Who presents with left face left arm numbness, tingling, concern for stroke Symptoms started around 1 PM yesterday afternoon, with symptoms resolving by the time he presented to the emergency room No facial droop, no leg involvement, only left face and arm Emergency room felt he was in complete AV block, Seen by neurology in the emergency room, diagnosed with TIA, started on aspirin  Records reviewed by myself CT scan with no acute findings Carotid ultrasound with bilateral plaque right greater than left, less than 50%  At baseline he exercises on a regular basis Able to run 2-4 miles Blood pressure stable,  Previous EKGs October 2016 with normal sinus rhythm, second-degree AV block type I Follow-up EKG showed normal sinus rhythm with what appears to be second-degree AV block, APCs in a bigeminal pattern  Rare Twinges of chest pain, sometimes happens when he starts running then goes away quickly Every 3 months, reports having episode of dull chest pain at rest,    labile  pressures, occasionally blood pressure is very high  Takes propranolol at nighttime for palpitations    Past Medical History:  Diagnosis Date  . Aortic valve disorders   . Heart block   . Hyperlipidemia   . Hypertension   . Lyme disease   . PVC's (premature ventricular contractions)       Most Recent Cardiac Studies: Echocardiogram Shows normal LV systolic function, mildly elevated right heart pressures, aortic valve sclerosis without significant stenosis    Surgical History:  Past Surgical History:  Procedure Laterality Date  . ANKLE SURGERY     right ankle fracture  . BIOPSY PROSTATE  2001 & 2003   x 2  . CARDIAC CATHETERIZATION  1990's  . HERNIA REPAIR  2012  . LAPAROSCOPIC ABLATION RENAL MASS    . PROSTATE SURGERY    . TONSILLECTOMY AND ADENOIDECTOMY       Home Meds: Prior to Admission medications   Medication Sig Start Date End Date Taking? Authorizing Provider  Artemether-Lumefantrine (COARTEM PO) Take by mouth 2 (two) times daily.   Yes Historical Provider, MD  aspirin 81 MG tablet Take by mouth. 07/24/12  Yes Historical Provider, MD  co-enzyme Q-10 30 MG capsule Take 30 mg by mouth 3 (three) times daily.   Yes Historical Provider, MD  COLCRYS 0.6 MG tablet Take 0.6 mg by mouth as needed.  10/07/12  Yes Historical Provider, MD  diclofenac sodium (VOLTAREN) 1 % GEL Place onto the skin. 01/20/14  Yes Historical Provider, MD  doxycycline (VIBRA-TABS) 100 MG tablet Take 100 mg by mouth 2 (two) times daily.   Yes Historical Provider, MD  dutasteride (AVODART) 0.5 MG capsule Take 1 mg by  mouth as needed.    Yes Historical Provider, MD  esomeprazole (NEXIUM) 40 MG capsule Take 40 mg by mouth daily as needed.    Yes Historical Provider, MD  febuxostat (ULORIC) 40 MG tablet Take 10 mg by mouth daily.   Yes Historical Provider, MD  furosemide (LASIX) 20 MG tablet Take 1 tablet (20 mg total) by mouth daily as needed. 12/31/13  Yes Minna Merritts, MD  Garlic 10 MG CAPS Take by  mouth.   Yes Historical Provider, MD  hydrALAZINE (APRESOLINE) 25 MG tablet Take 1 tablet (25 mg total) by mouth 3 (three) times daily as needed. 09/14/15  Yes Minna Merritts, MD  lisinopril (PRINIVIL,ZESTRIL) 5 MG tablet Take 1 tablet (5 mg total) by mouth daily. 12/05/15  Yes Minna Merritts, MD  MULTIPLE VITAMIN PO Take by mouth. 07/24/12  Yes Historical Provider, MD  Omega-3 Fatty Acids (FISH OIL) 1000 MG CAPS Take 1,000 mg by mouth 1 dose over 24 hours.     Yes Historical Provider, MD  propranolol (INDERAL) 20 MG tablet Take 1 tablet (20 mg total) by mouth 3 (three) times daily as needed. Patient taking differently: Take 20 mg by mouth at bedtime.  01/29/16  Yes Minna Merritts, MD  Saccharomyces boulardii (PROBIOTIC) 250 MG CAPS Take by mouth.   Yes Historical Provider, MD  silodosin (RAPAFLO) 8 MG CAPS capsule Take by mouth as needed.  07/24/12  Yes Historical Provider, MD  Testosterone (ANDROGEL PUMP) 20.25 MG/ACT (1.62%) GEL 4 pumps daily 01/17/16  Yes Birdie Sons, MD  vitamin E 400 UNIT capsule Take 400 Units by mouth daily.   Yes Historical Provider, MD  atorvastatin (LIPITOR) 40 MG tablet Take 1 tablet (40 mg total) by mouth daily at 6 PM. 06/15/16   Gladstone Lighter, MD  temazepam (RESTORIL) 30 MG capsule Take by mouth at bedtime as needed.  01/20/14   Historical Provider, MD  testosterone cypionate (DEPO-TESTOSTERONE) 200 MG/ML injection Inject 1 mL (200 mg total) into the muscle every 14 (fourteen) days. Patient not taking: Reported on 06/14/2016 06/04/16   Jerrol Banana., MD    Inpatient Medications:  . acidophilus  1 capsule Oral BID  . aspirin EC  325 mg Oral Daily  . atorvastatin  40 mg Oral q1800  . doxycycline  100 mg Oral BID  . dutasteride  0.5 mg Oral Daily  . enoxaparin (LOVENOX) injection  40 mg Subcutaneous Q24H  . febuxostat  20 mg Oral Daily  . omega-3 acid ethyl esters  1 g Oral Daily  . pantoprazole  40 mg Oral Daily  . sodium chloride flush  3 mL  Intravenous Q12H  . vitamin E  400 Units Oral Daily     Allergies:  Allergies  Allergen Reactions  . Iodine     IVP contrast  . Penicillins     Social History   Social History  . Marital status: Married    Spouse name: N/A  . Number of children: N/A  . Years of education: N/A   Occupational History  . Not on file.   Social History Main Topics  . Smoking status: Never Smoker  . Smokeless tobacco: Never Used  . Alcohol use 1.2 oz/week    1 Glasses of wine, 1 Cans of beer per week  . Drug use: No  . Sexual activity: Not Currently   Other Topics Concern  . Not on file   Social History Narrative   Independent at baseline. Lives  with family     Family History  Problem Relation Age of Onset  . Heart attack Brother   . Aortic stenosis Mother   . Arthritis Father      Review of Systems: Review of Systems  Constitutional: Negative.   Respiratory: Positive for shortness of breath.   Cardiovascular: Negative.   Gastrointestinal: Negative.   Musculoskeletal: Negative.   Neurological: Positive for dizziness.       Left face left arm numbness tingling, now resolved  Psychiatric/Behavioral: Negative.   All other systems reviewed and are negative.   Labs: CBC  Recent Labs  06/14/16 1346  WBC 6.6  NEUTROABS 3.4  HGB 15.4  HCT 47.8  MCV 90.3  PLT 0000000   Basic Metabolic Panel  Recent Labs  06/14/16 1346  NA 134*  K 5.1  CL 101  CO2 28  GLUCOSE 86  BUN 22*  CREATININE 1.49*  CALCIUM 9.7   Liver Function Tests  Recent Labs  06/14/16 1346  AST 25  ALT 18  ALKPHOS 64  BILITOT 0.8  PROT 7.1  ALBUMIN 4.2   No results for input(s): LIPASE, AMYLASE in the last 72 hours. Cardiac Enzymes  Recent Labs  06/14/16 1346  TROPONINI <0.03   BNP Invalid input(s): POCBNP D-Dimer No results for input(s): DDIMER in the last 72 hours. Hemoglobin A1C  Recent Labs  06/14/16 1346  HGBA1C 5.5   Fasting Lipid Panel  Recent Labs  06/14/16 1346   CHOL 142  HDL 59  LDLCALC 75  TRIG 38  CHOLHDL 2.4   Thyroid Function Tests No results for input(s): TSH, T4TOTAL, T3FREE, THYROIDAB in the last 72 hours.  Invalid input(s): FREET3  Radiology/Studies:  Ct Head Wo Contrast  Result Date: 06/14/2016  IMPRESSION: No acute intracranial abnormality. Findings called to Dr. Clearnce Hasten Electronically Signed   By: Dorise Bullion III M.D   On: 06/14/2016 13:53   Mr Jodene Nam Head Wo Contrast  Result Date: 06/15/2016  IMPRESSION: Mild atrophy and small vessel disease. No acute intracranial findings. No proximal intracranial flow limiting stenosis. LEFT vertebral sole contributor to the basilar. Electronically Signed   By: Staci Righter M.D.   On: 06/15/2016 11:33   Mr Brain Wo Contrast  Result Date: 06/15/2016  IMPRESSION: Mild atrophy and small vessel disease. No acute intracranial findings. No proximal intracranial flow limiting stenosis. LEFT vertebral sole contributor to the basilar. Electronically Signed   By: Staci Righter M.D.   On: 06/15/2016 11:33   US Carotid Bilateral  Result Date: 06/15/2016  IMPRESSION: 1. Bilateral carotid bifurcation plaque right greater than left, resulting in less than 50% diameter stenosis. 2.  Antegrade bilateral vertebral arterial flow. Electronically Signed   By: Lucrezia Europe M.D.   On: 06/15/2016 10:38    EKG: EKG reviewed by myself showing sinus rhythm with underlying complete heart block Complete heart block seen on telemetry EKG lab work, chest x-ray, echocardiogram reviewed independently by myself  Weights: Filed Weights   06/14/16 1403  Weight: 165 lb (74.8 kg)     Physical Exam:Normal sinus rhythm with second-degree AV block Blood pressure (!) 121/51, pulse (!) 36, temperature 97.8 F (36.6 C), resp. rate 20, height 5' 7.5" (1.715 m), weight 165 lb (74.8 kg), SpO2 99 %. Body mass index is 25.46 kg/m. GEN: Well nourished, well developed, in no acute distress.  HEENT: Grossly normal.  Neck:  Supple, no JVD, carotid bruits, or masses. Cardiac: Regular rate, bradycardic, 1/^ SEM RSB,  murmurs, rubs, or gallops.  No clubbing, cyanosis, edema.  Radials/DP/PT 2+ and equal bilaterally.  Respiratory:  Respirations regular and unlabored, clear to auscultation bilaterally. GI: Soft, nontender, nondistended, BS + x 4. MS: no deformity or atrophy. Skin: warm and dry, no rash. Neuro:  Strength and sensation are intact. Psych: AAOx3.  Normal affect.    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 He has occluded vertebral artery, timing unclear Otherwise patent vessels, mild nonobstructive bilateral carotid disease Echocardiogram with normal LV function, bubble study negative He does have heart block this admission Recommended by neurology to start aspirin and Plavix Perhaps arrhythmia such as atrial fibrillation could be monitored using his pacemaker  2) complete heart block Seems to have progressed from second-degree type X to complete heart block possibly over the past several months.  This coincides with symptoms per his wife who is at the bedside, increased shortness of breath, lethargy on exertion, worsening dizziness when standing/orthostasis Review of telemetry shows heart rate in the 30s overnight, 40s  during the day,  Loss of chronic chronotropic competence. He reports minimal improvement in heart rate with exertion, possibly up to rate of 50 -Case discussed with Dr. Caryl Comes, given his worsening symptoms, telemetry and EKG confirming complete heart block, he would be a candidate for pacemaker Long discussion with the patient and wife concerning pacemakers, discussed his symptoms, how they correlate to underlying complete heart block He is willing to consider pacemaker, would like to talk with Dr. Michaele Offer he is safe enough to go home as he has been stable for several months We will not transfer him, rather have him follow-up in clinic close follow-up appointment Tuesday, February 13 at 9 AM We have recommended if he has any worsening symptoms that he call 911, proceed to [place]  3) chronic renal insufficiency History of nephrectomy Creatinine stable  4) Hypertension Blood pressure 120 up to 140 takes lisinopril daily, rarely takes her drowsy in   has not taken propranolol recently   5) PVCs Reports having frequent PVCs at nighttime, improved with propranolol in the evening as needed Has not been taking propranolol recently as PVCs have been relatively benign   Total encounter time more than 110 minutes  Greater than 50% was spent in counseling and coordination of care with the patient   Signed, Esmond Plants, MD, Ph.D [facility] 06/15/2016

## 2016-06-15 NOTE — Progress Notes (Signed)
Physical Therapy Evaluation Patient Details Name: Thomas [name] MRN: [id number] DOB: [birth year] Today's Date: 06/15/2016   History of Present Illness  Patient is an 83 y.o. male admitted on 09 FEB with L UE/facial numbness. PMH includes HTN, HLD, Lyme's Disease, and Type II heart block. 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  Clinical Impression  Patient is a pleasant male admitted for above listed reasons. 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 Notes running 2 miles every day. Admits to heart block and bradycardia at baseline for which he continues to have f/u. Upon evaluation, patient demonstrates independence with transfers and gait, ambulating 520' without difficulty. Post-treatment HR 48 bpm; patient reports baseline is usually 40 bpm. Because patient admits to history of stenosis with cervical/thoracic/low back pain, it is believed that his symptoms may potentially have been a referral from thoracic spine if all other tests/procedures prove to be negative. Discussed f/u with OP PT if patient continues to have neck/back pain in the future. Patient is otherwise at baseline level of function and requires no future PT f/u when deemed medically stable for d/c.    Follow Up Recommendations No PT follow up    Equipment Recommendations  None recommended by PT    Recommendations for Other Services       Precautions / Restrictions Precautions Precautions: None Restrictions Weight Bearing Restrictions: No      Mobility  Bed Mobility                  Transfers Overall transfer level: Independent Equipment used: None             General transfer comment: Patient moves from sit to stand and stand to sit without difficulty and good safety awareness.  Ambulation/Gait Ambulation/Gait assistance: Independent Ambulation Distance (Feet): 520 Feet Assistive device: None       General Gait Details: Patient ambulates  with non-antalgic gait and normal walking speed without assistance. Able to maintain conversation without SOB.  Stairs            Wheelchair Mobility    Modified Rankin (Stroke Patients Only)       Balance Overall balance assessment: Independent;No apparent balance deficits (not formally assessed)                                           Pertinent Vitals/Pain Pain Assessment: No/denies pain    Home Living Family/patient expects to be discharged to:: Private residence Living Arrangements: Spouse/significant other Available Help at Discharge: Family;Available PRN/intermittently Type of Home: House Home Access: Stairs to enter Entrance Stairs-Rails: None Entrance Stairs-Number of Steps: 3 Home Layout: One level Home Equipment: None      Prior Function Level of Independence: Independent         Comments: Patient lives with wife. 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     Hand Dominance        Extremity/Trunk Assessment   Upper Extremity Assessment Upper Extremity Assessment: Overall WFL for tasks assessed    Lower Extremity Assessment Lower Extremity Assessment: Overall WFL for tasks assessed    Cervical / Trunk Assessment Cervical / Trunk Assessment: Kyphotic  Communication   Communication: No difficulties  Cognition Arousal/Alertness: Awake/alert Behavior During Therapy: WFL for tasks assessed/performed Overall Cognitive Status: Within Functional Limits for tasks assessed  General Comments      Exercises     Assessment/Plan    PT Assessment Patent does not need any further PT services  PT Problem List            PT Treatment Interventions      PT Goals (Current goals can be found in the Care Plan section)  Acute Rehab PT Goals Patient Stated Goal: "To go home" PT Goal Formulation: With patient/family Time For Goal Achievement: 06/29/16 Potential to Achieve Goals: Good     Frequency     Barriers to discharge        Co-evaluation               End of Session Equipment Utilized During Treatment: Gait belt Activity Tolerance: Patient tolerated treatment well Patient left: in chair;with call bell/phone within reach;with family/visitor present      Functional Assessment Tool Used: Clinical Judgement, 17mW Functional Limitation: Mobility: Walking and moving around Mobility: Walking and Moving Around Current Status [id number]): At least 1 percent but less than 20 percent impaired, limited or restricted Mobility: Walking and Moving Around Goal Status [phone]): At least 1 percent but less than 20 percent impaired, limited or restricted Mobility: Walking and Moving Around Discharge Status [phone]): At least 1 percent but less than 20 percent impaired, limited or restricted    Time: 1134-1150 PT Time Calculation (min) (ACUTE ONLY): 16 min   Charges:   PT Evaluation $PT Eval Low Complexity: 1 Procedure     PT G Codes:   PT G-Codes **NOT FOR INPATIENT CLASS** Functional Assessment Tool Used: Clinical Judgement, 64mW Functional Limitation: Mobility: Walking and moving around Mobility: Walking and Moving Around Current Status [id number]): At least 1 percent but less than 20 percent impaired, limited or restricted Mobility: Walking and Moving Around Goal Status [phone]): At least 1 percent but less than 20 percent impaired, limited or restricted Mobility: Walking and Moving Around Discharge Status [phone]): At least 1 percent but less than 20 percent impaired, limited or restricted    Dorice Lamas, PT, DPT 06/15/2016, 12:25 PM

## 2016-06-15 NOTE — Progress Notes (Signed)
POT Cancellation Note  Patient Details Name: Thomas [name] MRN: [id number] DOB: August 05, 1932   Cancelled Treatment:     OT order for evaluation, patient screened and did not need OT services at this time.  MD in with patient discussing care and also agreed patient did not require OT evaluation.  Reconsult if needs arise.  Amy T Lovett, OTR/L, CLT  Lovett,Amy 06/15/2016, 1:48 PM

## 2016-06-15 NOTE — Care Management Obs Status (Signed)
[place] NOTIFICATION   Patient Details  Name: Thomas [name] MRN: [id number] Date of Birth: [birth year]   Medicare Observation Status Notification Given:  Yes    Sayed Apostol A, RN 06/15/2016, 4:17 PM

## 2016-06-15 NOTE — Progress Notes (Signed)
*  PRELIMINARY RESULTS* Echocardiogram 2D Echocardiogram has been performed.  Lavell Luster Bela Nyborg 06/15/2016, 3:25 PM

## 2016-06-15 NOTE — Progress Notes (Signed)
Subjective: Patient at baseline.  No new neurological complaints.    Objective: Current vital signs: BP (!) 121/51 (BP Location: Right Arm)   Pulse (!) 36   Temp 97.8 F (36.6 C)   Resp 20   Ht 5' 7.5" (1.715 m)   Wt 74.8 kg (165 lb)   SpO2 99%   BMI 25.46 kg/m  Vital signs in last 24 hours: Temp:  [97.5 F (36.4 C)-98 F (36.7 C)] 97.8 F (36.6 C) (02/10 0413) Pulse Rate:  [36-115] 36 (02/10 0413) Resp:  [11-20] 20 (02/10 0413) BP: (121-170)/(51-79) 121/51 (02/10 0413) SpO2:  [97 %-100 %] 99 % (02/10 0413) Weight:  [74.8 kg (165 lb)] 74.8 kg (165 lb) (02/09 1403)  Intake/Output from previous day: No intake/output data recorded. Intake/Output this shift: Total I/O In: 240 [P.O.:240] Out: -  Nutritional status: Diet 2 gram sodium Room service appropriate? Yes; Fluid consistency: Thin Diet - low sodium heart healthy  Neurologic Exam: Mental Status: Alert, oriented, thought content appropriate.  Speech fluent without evidence of aphasia.  Able to follow 3 step commands without difficulty. Cranial Nerves: II: Discs flat bilaterally; Visual fields grossly normal, pupils equal, round, reactive to light and accommodation III,IV, VI: ptosis not present, extra-ocular motions intact bilaterally V,VII: smile symmetric VIII: hearing normal bilaterally IX,X: gag reflex present XI: bilateral shoulder shrug XII: midline tongue extension Motor: Right :  Upper extremity   5/5                                      Left:     Upper extremity   5/5             Lower extremity   5/5                                                  Lower extremity   5/5 Tone and bulk:normal tone throughout; no atrophy noted Sensory: Pinprick and light touch intact throughout, bilaterally Deep Tendon Reflexes: 2+ and symmetric throughout Gait: Normal  Lab Results: Basic Metabolic Panel:  Recent Labs Lab 06/14/16 1346  NA 134*  K 5.1  CL 101  CO2 28  GLUCOSE 86  BUN 22*  CREATININE 1.49*   CALCIUM 9.7    Liver Function Tests:  Recent Labs Lab 06/14/16 1346  AST 25  ALT 18  ALKPHOS 64  BILITOT 0.8  PROT 7.1  ALBUMIN 4.2   No results for input(s): LIPASE, AMYLASE in the last 168 hours. No results for input(s): AMMONIA in the last 168 hours.  CBC:  Recent Labs Lab 06/14/16 1346  WBC 6.6  NEUTROABS 3.4  HGB 15.4  HCT 47.8  MCV 90.3  PLT 175    Cardiac Enzymes:  Recent Labs Lab 06/14/16 1346  TROPONINI <0.03    Lipid Panel:  Recent Labs Lab 06/14/16 1346  CHOL 142  TRIG 38  HDL 59  CHOLHDL 2.4  VLDL 8  LDLCALC 75    CBG:  Recent Labs Lab 06/14/16 1355  GLUCAP 90    Microbiology: No results found for this or any previous visit.  Coagulation Studies:  Recent Labs  06/14/16 1346  LABPROT 13.3  INR 1.01    Imaging: Ct Head Wo Contrast  Result Date: 06/14/2016 CLINICAL DATA:  Dizziness.  Headache and extremity weakness. EXAM: CT HEAD WITHOUT CONTRAST TECHNIQUE: Contiguous axial images were obtained from the base of the skull through the vertex without intravenous contrast. COMPARISON:  None. FINDINGS: Brain: No subdural, epidural, or subarachnoid hemorrhage. Cerebellum, brainstem, and basal cisterns are normal. Ventricles and sulci are unremarkable for age. Mild scattered white matter changes. No acute cortical ischemia or infarct. No mass, mass effect, or midline shift. Vascular: Calcified atherosclerosis in the intracranial carotid arteries. Skull: Normal. Negative for fracture or focal lesion. Sinuses/Orbits: No acute finding. Other: None. IMPRESSION: No acute intracranial abnormality. Findings called to Dr. Clearnce Hasten Electronically Signed   By: Dorise Bullion III M.D   On: 06/14/2016 13:53   Mr Jodene Nam Head Wo Contrast  Result Date: 06/15/2016 CLINICAL DATA:  History of mini stroke years ago, numbness LEFT arm and face for 12 hours has now resolved. Stroke risk factors include hypertension and hyperlipidemia. EXAM: MRI HEAD WITHOUT  CONTRAST MRA HEAD WITHOUT CONTRAST TECHNIQUE: Multiplanar, multiecho pulse sequences of the brain and surrounding structures were obtained without intravenous contrast. Angiographic images of the head were obtained using MRA technique without contrast. COMPARISON:  CT head 06/14/2016. FINDINGS: MRI HEAD FINDINGS Brain: No evidence for acute infarction, hemorrhage, mass lesion, hydrocephalus, or extra-axial fluid. Mild atrophy. Minor white matter disease, nonspecific. Vascular: Flow voids are maintained throughout the carotid, basilar, and vertebral arteries. There are no areas of chronic hemorrhage. Skull and upper cervical spine: Unremarkable visualized calvarium, skullbase, and cervical vertebrae. Pituitary, pineal, cerebellar tonsils unremarkable. No upper cervical cord lesions. Sinuses/Orbits: Negative. Other: None. MRA HEAD FINDINGS The internal carotid arteries are widely patent. The basilar artery is widely patent. Artifactual signal loss at the LEFT vertebral basilar confluence. LEFT vertebral is the sole contributor to the basilar with the RIGHT vertebral ending in PICA. No flow-limiting proximal intracranial stenosis or visible saccular aneurysm. IMPRESSION: Mild atrophy and small vessel disease. No acute intracranial findings. No proximal intracranial flow limiting stenosis. LEFT vertebral sole contributor to the basilar. Electronically Signed   By: Staci Righter M.D.   On: 06/15/2016 11:33   Mr Brain Wo Contrast  Result Date: 06/15/2016 CLINICAL DATA:  History of mini stroke years ago, numbness LEFT arm and face for 12 hours has now resolved. Stroke risk factors include hypertension and hyperlipidemia. EXAM: MRI HEAD WITHOUT CONTRAST MRA HEAD WITHOUT CONTRAST TECHNIQUE: Multiplanar, multiecho pulse sequences of the brain and surrounding structures were obtained without intravenous contrast. Angiographic images of the head were obtained using MRA technique without contrast. COMPARISON:  CT head  06/14/2016. FINDINGS: MRI HEAD FINDINGS Brain: No evidence for acute infarction, hemorrhage, mass lesion, hydrocephalus, or extra-axial fluid. Mild atrophy. Minor white matter disease, nonspecific. Vascular: Flow voids are maintained throughout the carotid, basilar, and vertebral arteries. There are no areas of chronic hemorrhage. Skull and upper cervical spine: Unremarkable visualized calvarium, skullbase, and cervical vertebrae. Pituitary, pineal, cerebellar tonsils unremarkable. No upper cervical cord lesions. Sinuses/Orbits: Negative. Other: None. MRA HEAD FINDINGS The internal carotid arteries are widely patent. The basilar artery is widely patent. Artifactual signal loss at the LEFT vertebral basilar confluence. LEFT vertebral is the sole contributor to the basilar with the RIGHT vertebral ending in PICA. No flow-limiting proximal intracranial stenosis or visible saccular aneurysm. IMPRESSION: Mild atrophy and small vessel disease. No acute intracranial findings. No proximal intracranial flow limiting stenosis. LEFT vertebral sole contributor to the basilar. Electronically Signed   By: Staci Righter M.D.   On: 06/15/2016 11:33   US Carotid Bilateral  Result Date: 06/15/2016 CLINICAL DATA:  TIA. Left arm numbness. Hypertension, syncope, hyperlipidemia. EXAM: BILATERAL CAROTID DUPLEX ULTRASOUND TECHNIQUE: Pearline Cables scale imaging, color Doppler and duplex ultrasound was performed of bilateral carotid and vertebral arteries in the neck. COMPARISON:  None. TECHNIQUE: Quantification of carotid stenosis is based on velocity parameters that correlate the residual internal carotid diameter with NASCET-based stenosis levels, using the diameter of the distal internal carotid lumen as the denominator for stenosis measurement. The following velocity measurements were obtained: PEAK SYSTOLIC/END DIASTOLIC RIGHT ICA:                     112/13cm/sec CCA:                     [id number] SYSTOLIC ICA/CCA RATIO:  0.8 DIASTOLIC  ICA/CCA RATIO: 0.8 ECA:                     145cm/sec LEFT ICA:                     79/13cm/sec CCA:                     [id number] SYSTOLIC ICA/CCA RATIO:  [id number] DIASTOLIC ICA/CCA RATIO: [id number] ECA:                     131cm/sec FINDINGS: RIGHT CAROTID ARTERY: Intimal thickening through the common carotid artery. Eccentric partially calcified plaque in the bulb. No high-grade stenosis. Normal waveforms and color Doppler signal. Distal ICA mildly tortuous. RIGHT VERTEBRAL ARTERY:  Normal flow direction and waveform. LEFT CAROTID ARTERY: Minimal plaque in the carotid bulb. No high-grade stenosis. Normal waveforms and color Doppler signal. LEFT VERTEBRAL ARTERY: Normal flow direction and waveform. IMPRESSION: 1. Bilateral carotid bifurcation plaque right greater than left, resulting in less than 50% diameter stenosis. 2.  Antegrade bilateral vertebral arterial flow. Electronically Signed   By: Lucrezia Europe M.D.   On: 06/15/2016 10:38    Medications:  I have reviewed the patient's current medications. Scheduled: . acidophilus  1 capsule Oral BID  . aspirin EC  325 mg Oral Daily  . atorvastatin  40 mg Oral q1800  . doxycycline  100 mg Oral BID  . dutasteride  0.5 mg Oral Daily  . enoxaparin (LOVENOX) injection  40 mg Subcutaneous Q24H  . febuxostat  20 mg Oral Daily  . omega-3 acid ethyl esters  1 g Oral Daily  . pantoprazole  40 mg Oral Daily  . sodium chloride flush  3 mL Intravenous Q12H  . vitamin E  400 Units Oral Daily    Assessment/Plan: Patient at baseline.  MRI of the brain shows no acute changes.  MRA shows the right vertebral ending at the PICA.  Remaining vasculature unremarkable.  Patient on ASA at home. Carotid dopplers show no evidence of hemodynamically significant stenosis.  Echocardiogram pending.  A1c 5.5, LDL 56.  Recommendations: 1.  ASA 81mg , Plavix 75 mg daily 2.  To follow up with neurology on an outpatient basis   LOS: 0 days   Alexis Goodell,  MD Neurology [phone] 06/15/2016  1:21 PM

## 2016-06-15 NOTE — Progress Notes (Signed)
SLP Cancellation Note  Patient Details Name: AKONI PARTON MRN: [id number] DOB: 12/19/1932   Cancelled treatment:       Reason Eval/Treat Not Completed: SLP screened, no needs identified, will sign off (chart reviewed; NSG consulted; met w/ pt and family). Met w/ pt and family who both denied any issues w/ swallowing. NSG indicated toleration of oral medications. Pt denied any issues w/ speech and communication stating he was at his baseline w/ communication. Pt did communicate w/ this SLP w/ no apparent deficits; wife agreed.  NSG to reconsult if any change in status while admitted.    Orinda Kenner, MS, CCC-SLP Watson,Katherine 06/15/2016, 9:54 AM

## 2016-06-15 NOTE — Progress Notes (Signed)
PT Cancellation Note  Patient Details Name: CHANDLER ANDERSEN MRN: [id number] DOB: March 04, 1933   Cancelled Treatment:    Reason Eval/Treat Not Completed: Patient at procedure or test/unavailable. PT attempted evaluation at 0950, and patient was at procedure. Will check back later if time permits.   Dorice Lamas, PT, DPT 06/15/2016, 9:53 AM

## 2016-06-16 NOTE — Discharge Summary (Signed)
[facility] NAME: Thomas [name]    MR#:  [id number]  DATE OF BIRTH:  01/10/1933  DATE OF ADMISSION:  06/14/2016   ADMITTING PHYSICIAN: Gladstone Lighter, MD  DATE OF DISCHARGE: 06/15/2016  4:30 PM  PRIMARY CARE PHYSICIAN: [name], MD   ADMISSION DIAGNOSIS:   Complete heart block ([facility]) [I44.2] TIA (transient ischemic attack) [G45.9] Transient cerebral ischemia, unspecified type [G45.9]  DISCHARGE DIAGNOSIS:   Active Problems:   TIA (transient ischemic attack)   Complete heart block ([facility])   Pain in thoracic spine   Orthostasis   Lethargy   Occlusion of vertebral artery   SECONDARY DIAGNOSIS:   Past Medical History:  Diagnosis Date  . Aortic valve disorders   . Heart block   . Hyperlipidemia   . Hypertension   . Lyme disease   . PVC's (premature ventricular contractions)     HOSPITAL COURSE:   Thomas [name]  is a 83 y.o. male with a known history of Hypertension, hyperlipidemia, adequate without stenosis, Lyme's disease, history of type II heart block) from home secondary to left arm numbness.  #1 TIA- with left arm numbness, resolved now - appreciate neuro consult - MRI with no acute infarcts, MRA with no stenosis, normal dopplers and ECHO - advised to take asa regularly. Will hold off on plavix at this time as plans for pacemaker soon - also neurological symptoms could have been triggered by complete heart block and diminshed blood flow - statin dose increased, LDL at 75 - ambulated well with physical therapy, no further needs identified.  #2 complete heart block- progressed from type 2 to complete now - normal ECHO - asymptomatic  -hold propranolol. F/u with EP Dr. Caryl Comes in 2 days for pacemaker placement - appreciate in house cardiology consult  #3 Lymes disease- continue long term doxycycline and also oral probiotics  #4 GERD-  nexium  #5 Gout- uloric  Stable for discharge with close  f/u set up.   DISCHARGE CONDITIONS:   Guarded  CONSULTS OBTAINED:   Treatment Team:  Catarina Hartshorn, MD Wellington Hampshire, MD Minna Merritts, MD  DRUG ALLERGIES:   Allergies  Allergen Reactions  . Iodine     IVP contrast  . Penicillins    DISCHARGE MEDICATIONS:   Allergies as of 06/15/2016      Reactions   Iodine    IVP contrast   Penicillins       Medication List    STOP taking these medications   furosemide 20 MG tablet Commonly known as:  LASIX   propranolol 20 MG tablet Commonly known as:  INDERAL   RAPAFLO 8 MG Caps capsule Generic drug:  silodosin   Testosterone 20.25 MG/ACT (1.62%) Gel Commonly known as:  ANDROGEL PUMP   testosterone cypionate 200 MG/ML injection Commonly known as:  DEPO-TESTOSTERONE     TAKE these medications   aspirin 81 MG tablet Take by mouth.   atorvastatin 40 MG tablet Commonly known as:  LIPITOR Take 1 tablet (40 mg total) by mouth daily at 6 PM. What changed:  See the new instructions.   co-enzyme Q-10 30 MG capsule Take 30 mg by mouth 3 (three) times daily.   COARTEM PO Take by mouth 2 (two) times daily.   COLCRYS 0.6 MG tablet Generic drug:  colchicine Take 0.6 mg by mouth as needed.   doxycycline 100 MG tablet Commonly known as:  VIBRA-TABS Take 100 mg by mouth 2 (  two) times daily.   dutasteride 0.5 MG capsule Commonly known as:  AVODART Take 1 mg by mouth as needed.   esomeprazole 40 MG capsule Commonly known as:  NEXIUM Take 40 mg by mouth daily as needed.   febuxostat 40 MG tablet Commonly known as:  ULORIC Take 10 mg by mouth daily.   Fish Oil 1000 MG Caps Take 1,000 mg by mouth 1 dose over 24 hours.   Garlic 10 MG Caps Take by mouth.   hydrALAZINE 25 MG tablet Commonly known as:  APRESOLINE Take 1 tablet (25 mg total) by mouth 3 (three) times daily as needed.   lisinopril 5 MG tablet Commonly known as:  PRINIVIL,ZESTRIL Take 1 tablet (5 mg total) by mouth daily.   MULTIPLE  VITAMIN PO Take by mouth.   Probiotic 250 MG Caps Take by mouth.   temazepam 30 MG capsule Commonly known as:  RESTORIL Take by mouth at bedtime as needed.   vitamin E 400 UNIT capsule Take 400 Units by mouth daily.   VOLTAREN 1 % Gel Generic drug:  diclofenac sodium Place onto the skin.        DISCHARGE INSTRUCTIONS:   1. PCP f/u in 1-2 weeks 2. F/u in 2 days with Dr. Caryl Comes for pacemaker  DIET:   Cardiac diet  ACTIVITY:   Activity as tolerated  OXYGEN:   Home Oxygen: No.  Oxygen Delivery: room air  DISCHARGE LOCATION:   home   If you experience worsening of your admission symptoms, develop shortness of breath, life threatening emergency, suicidal or homicidal thoughts you must seek medical attention immediately by calling 911 or calling your MD immediately  if symptoms less severe.  You Must read complete instructions/literature along with all the possible adverse reactions/side effects for all the Medicines you take and that have been prescribed to you. Take any new Medicines after you have completely understood and accpet all the possible adverse reactions/side effects.   Please note  You were cared for by a hospitalist during your hospital stay. If you have any questions about your discharge medications or the care you received while you were in the hospital after you are discharged, you can call the unit and asked to speak with the hospitalist on call if the hospitalist that took care of you is not available. Once you are discharged, your primary care physician will handle any further medical issues. Please note that NO REFILLS for any discharge medications will be authorized once you are discharged, as it is imperative that you return to your primary care physician (or establish a relationship with a primary care physician if you do not have one) for your aftercare needs so that they can reassess your need for medications and monitor your lab values.    On  the day of Discharge:  VITAL SIGNS:   Blood pressure (!) 143/58, pulse (!) 41, temperature 97.6 F (36.4 C), temperature source Oral, resp. rate 20, height 5' 7.5" (1.715 m), weight 74.8 kg (165 lb), SpO2 97 %.  PHYSICAL EXAMINATION:    GENERAL:  83 y.o.-year-old patient lying in the bed with no acute distress.  EYES: Pupils equal, round, reactive to light and accommodation. No scleral icterus. Extraocular muscles intact.  HEENT: Head atraumatic, normocephalic. Oropharynx and nasopharynx clear.  NECK:  Supple, no jugular venous distention. No thyroid enlargement, no tenderness.  LUNGS: Normal breath sounds bilaterally, no wheezing, rales,rhonchi or crepitation. No use of accessory muscles of respiration.  CARDIOVASCULAR: S1, S2 normal. No  murmurs, rubs, or gallops.  ABDOMEN: Soft, nontender, nondistended. Bowel sounds present. No organomegaly or mass.  EXTREMITIES: No pedal edema, cyanosis, or clubbing.  NEUROLOGIC: Cranial nerves II through XII are intact. Muscle strength 5/5 in all extremities. Sensation intact. Gait not checked. 2+ bilateral knee jerks and biceps reflexes PSYCHIATRIC: The patient is alert and oriented x 3.  SKIN: No obvious rash, lesion, or ulcer.   DATA REVIEW:   CBC  Recent Labs Lab 06/14/16 1346  WBC 6.6  HGB 15.4  HCT 47.8  PLT 175    Chemistries   Recent Labs Lab 06/14/16 1346  NA 134*  K 5.1  CL 101  CO2 28  GLUCOSE 86  BUN 22*  CREATININE 1.49*  CALCIUM 9.7  AST 25  ALT 18  ALKPHOS 64  BILITOT 0.8     Microbiology Results  No results found for this or any previous visit.  RADIOLOGY:  No results found.   Management plans discussed with the patient, family and they are in agreement.  CODE STATUS:  Code Status History    Date Active Date Inactive Code Status Order ID Comments User Context   06/14/2016  4:41 PM 06/15/2016  7:38 PM Full Code [id number]  Gladstone Lighter, MD Inpatient    Advance Directive Documentation   Flowsheet  Row Most Recent Value  Type of Advance Directive  Healthcare Power of Attorney, Living will  Pre-existing out of facility DNR order (yellow form or pink MOST form)  No data  "MOST" Form in Place?  No data      TOTAL TIME TAKING CARE OF THIS PATIENT: 37 minutes.    Gladstone Lighter M.D on 06/16/2016 at 11:30 AM  Between 7am to 6pm - Pager - [phone]  After 6pm go to [email or web address] - password [email or web address] [facility]  [facility]  Office  [phone]  CC: Primary care physician; [name], MD   Note: This dictation was prepared with Dragon dictation along with smaller phrase technology. Any transcriptional errors that result from this process are unintentional.

## 2016-06-18 ENCOUNTER — Ambulatory Visit (INDEPENDENT_AMBULATORY_CARE_PROVIDER_SITE_OTHER): Payer: Medicare Other | Admitting: Internal Medicine

## 2016-06-18 ENCOUNTER — Encounter: Payer: Self-pay | Admitting: Internal Medicine

## 2016-06-18 ENCOUNTER — Encounter: Payer: Self-pay | Admitting: *Deleted

## 2016-06-18 VITALS — BP 138/82 | HR 50 | Ht 67.0 in | Wt 174.5 lb

## 2016-06-18 DIAGNOSIS — I4892 Unspecified atrial flutter: Secondary | ICD-10-CM

## 2016-06-18 DIAGNOSIS — I442 Atrioventricular block, complete: Secondary | ICD-10-CM

## 2016-06-18 HISTORY — DX: Unspecified atrial flutter: I48.92

## 2016-06-18 MED ORDER — APIXABAN 2.5 MG PO TABS: 2.5000 mg | ORAL_TABLET | Freq: Two times a day (BID) | ORAL | 11 refills | Status: DC

## 2016-06-18 NOTE — Progress Notes (Signed)
ELECTROPHYSIOLOGY CONSULT NOTE  Patient ID: Thomas [name], MRN: [id number], DOB/AGE: [birth year] 83 y.o. Admit date: (Not on file) Date of Consult: 06/18/2016  Primary Physician: [name], MD Primary Cardiologist: [facility]  TG  Chief Complaint: Complete Heart Block   HPI [name] is a 83 y.o. male  referred following admission 2/18 for L numbness concerning for TIA/CVA   MRI neg  at that time he was noted to be in complete heart block. He has a long-standing history of progressive conduction system disease. In 2014 and first degree AV block 2016 3:2 second-degree AV block type I.  He has had no significant limitations in activity although even when he runs he has restricted heart rate excursion.   He has had some episodes of lightheadedness. His wife is more concerned about his symptoms then is seen. He has had no syncope  He thinks that he does better when his heart rate is below 50. His ability to describe the symptoms associated with a faster heart rate are not consistent. He describes both a pumping, as well as a chest pressure.  Echo LVEF 55-60%  Mild RV dysfunction   ECG NSR with 3:2 AV block with RBBB    Cath 1990s Demonstrating high-grade disease in a small diagonal   Thromboembolic risk profile age-54 TIA-2 hypertension-1 vascular disease-1 for a CHADS-VASc score of greater than or equal to 6   He has sleep disordered breathing  Past Medical History:  Diagnosis Date  . Aortic valve disorders   . Heart block   . Hyperlipidemia   . Hypertension   . Lyme disease   . PVC's (premature ventricular contractions)       Surgical History:  Past Surgical History:  Procedure Laterality Date  . ANKLE SURGERY     right ankle fracture  . BIOPSY PROSTATE  2001 & 2003   x 2  . CARDIAC CATHETERIZATION  1990's  . HERNIA REPAIR  2012  . LAPAROSCOPIC ABLATION RENAL MASS    . PROSTATE SURGERY    . TONSILLECTOMY AND ADENOIDECTOMY       Home  Meds: Prior to Admission medications   Medication Sig Start Date End Date Taking? Authorizing Provider  Artemether-Lumefantrine (COARTEM PO) Take by mouth 2 (two) times daily.    Historical Provider, MD  aspirin 81 MG tablet Take by mouth. 07/24/12   Historical Provider, MD  atorvastatin (LIPITOR) 40 MG tablet Take 1 tablet (40 mg total) by mouth daily at 6 PM. 06/15/16   Gladstone Lighter, MD  co-enzyme Q-10 30 MG capsule Take 30 mg by mouth 3 (three) times daily.    Historical Provider, MD  COLCRYS 0.6 MG tablet Take 0.6 mg by mouth as needed.  10/07/12   Historical Provider, MD  diclofenac sodium (VOLTAREN) 1 % GEL Place onto the skin. 01/20/14   Historical Provider, MD  doxycycline (VIBRA-TABS) 100 MG tablet Take 100 mg by mouth 2 (two) times daily.    Historical Provider, MD  dutasteride (AVODART) 0.5 MG capsule Take 1 mg by mouth as needed.     Historical Provider, MD  esomeprazole (NEXIUM) 40 MG capsule Take 40 mg by mouth daily as needed.     Historical Provider, MD  febuxostat (ULORIC) 40 MG tablet Take 10 mg by mouth daily.    Historical Provider, MD  Garlic 10 MG CAPS Take by mouth.    Historical Provider, MD  hydrALAZINE (APRESOLINE) 25 MG tablet Take 1 tablet (  25 mg total) by mouth 3 (three) times daily as needed. 09/14/15   Minna Merritts, MD  lisinopril (PRINIVIL,ZESTRIL) 5 MG tablet Take 1 tablet (5 mg total) by mouth daily. 12/05/15   Minna Merritts, MD  MULTIPLE VITAMIN PO Take by mouth. 07/24/12   Historical Provider, MD  Omega-3 Fatty Acids (FISH OIL) 1000 MG CAPS Take 1,000 mg by mouth 1 dose over 24 hours.      Historical Provider, MD  Saccharomyces boulardii (PROBIOTIC) 250 MG CAPS Take by mouth.    Historical Provider, MD  temazepam (RESTORIL) 30 MG capsule Take by mouth at bedtime as needed.  01/20/14   Historical Provider, MD  vitamin E 400 UNIT capsule Take 400 Units by mouth daily.    Historical Provider, MD    Allergies:  Allergies  Allergen Reactions  . Iodine      IVP contrast  . Penicillins     Social History   Social History  . Marital status: Married    Spouse name: N/A  . Number of children: N/A  . Years of education: N/A   Occupational History  . Not on file.   Social History Main Topics  . Smoking status: Never Smoker  . Smokeless tobacco: Never Used  . Alcohol use 1.2 oz/week    1 Glasses of wine, 1 Cans of beer per week  . Drug use: No  . Sexual activity: Not Currently   Other Topics Concern  . Not on file   Social History Narrative   Independent at baseline. Lives with family     Family History  Problem Relation Age of Onset  . Heart attack Brother   . Aortic stenosis Mother   . Arthritis Father      ROS:  Please see the history of present illness.     All other systems reviewed and negative.    Physical Exam:  Blood pressure 138/82, pulse (!) 50, height 5\' 7"  (1.702 m), weight 174 lb 8 oz (79.2 kg). General: Well developed, well nourished male in no acute distress. Head: Normocephalic, atraumatic, sclera non-icteric, no xanthomas, nares are without discharge. EENT: normal  Lymph Nodes:  none Neck: Negative for carotid bruits. JVD not elevated. Back:without scoliosis kyphosis  Lungs: Clear bilaterally to auscultation without wheezes, rales, or rhonchi. Breathing is unlabored.  Heart: Slow and irregular rhythm with a 2/6*murmur . No rubs, or gallops appreciated. Abdomen: Soft, non-tender, non-distended with normoactive bowel sounds. No hepatomegaly. No rebound/guarding. No obvious abdominal masses. Msk:  Strength and tone appear normal for age. Extremities: No clubbing or cyanosis. No  edema.  Distal pedal pulses are 2+ and equal bilaterally. Skin: Warm and Dry Neuro: Alert and oriented X 3. CN III-XII intact Grossly normal sensory and motor function . Psych:  Responds to questions appropriately with a normal affect.      Labs: Cardiac Enzymes No results for input(s): CKTOTAL, CKMB, TROPONINI in the last 72  hours. CBC Lab Results  Component Value Date   WBC 6.6 06/14/2016   HGB 15.4 06/14/2016   HCT 47.8 06/14/2016   MCV 90.3 06/14/2016   PLT 175 06/14/2016   PROTIME: No results for input(s): LABPROT, INR in the last 72 hours. Chemistry   Recent Labs Lab 06/14/16 1346  NA 134*  K 5.1  CL 101  CO2 28  BUN 22*  CREATININE 1.49*  CALCIUM 9.7  PROT 7.1  BILITOT 0.8  ALKPHOS 64  ALT 18  AST 25  GLUCOSE 86  Lipids Lab Results  Component Value Date   CHOL 142 06/14/2016   HDL 59 06/14/2016   LDLCALC 75 06/14/2016   TRIG 38 06/14/2016   BNP No results found for: PROBNP Thyroid Function Tests: No results for input(s): TSH, T4TOTAL, T3FREE, THYROIDAB in the last 72 hours.  Invalid input(s): FREET3 Miscellaneous No results found for: DDIMER  Radiology/Studies:  Ct Head Wo Contrast  Result Date: 06/14/2016 CLINICAL DATA:  Dizziness.  Headache and extremity weakness. EXAM: CT HEAD WITHOUT CONTRAST TECHNIQUE: Contiguous axial images were obtained from the base of the skull through the vertex without intravenous contrast. COMPARISON:  None. FINDINGS: Brain: No subdural, epidural, or subarachnoid hemorrhage. Cerebellum, brainstem, and basal cisterns are normal. Ventricles and sulci are unremarkable for age. Mild scattered white matter changes. No acute cortical ischemia or infarct. No mass, mass effect, or midline shift. Vascular: Calcified atherosclerosis in the intracranial carotid arteries. Skull: Normal. Negative for fracture or focal lesion. Sinuses/Orbits: No acute finding. Other: None. IMPRESSION: No acute intracranial abnormality. Findings called to Dr. Clearnce Hasten Electronically Signed   By: Dorise Bullion III M.D   On: 06/14/2016 13:53   Mr Jodene Nam Head Wo Contrast  Result Date: 06/15/2016 CLINICAL DATA:  History of mini stroke years ago, numbness LEFT arm and face for 12 hours has now resolved. Stroke risk factors include hypertension and hyperlipidemia. EXAM: MRI HEAD  WITHOUT CONTRAST MRA HEAD WITHOUT CONTRAST TECHNIQUE: Multiplanar, multiecho pulse sequences of the brain and surrounding structures were obtained without intravenous contrast. Angiographic images of the head were obtained using MRA technique without contrast. COMPARISON:  CT head 06/14/2016. FINDINGS: MRI HEAD FINDINGS Brain: No evidence for acute infarction, hemorrhage, mass lesion, hydrocephalus, or extra-axial fluid. Mild atrophy. Minor white matter disease, nonspecific. Vascular: Flow voids are maintained throughout the carotid, basilar, and vertebral arteries. There are no areas of chronic hemorrhage. Skull and upper cervical spine: Unremarkable visualized calvarium, skullbase, and cervical vertebrae. Pituitary, pineal, cerebellar tonsils unremarkable. No upper cervical cord lesions. Sinuses/Orbits: Negative. Other: None. MRA HEAD FINDINGS The internal carotid arteries are widely patent. The basilar artery is widely patent. Artifactual signal loss at the LEFT vertebral basilar confluence. LEFT vertebral is the sole contributor to the basilar with the RIGHT vertebral ending in PICA. No flow-limiting proximal intracranial stenosis or visible saccular aneurysm. IMPRESSION: Mild atrophy and small vessel disease. No acute intracranial findings. No proximal intracranial flow limiting stenosis. LEFT vertebral sole contributor to the basilar. Electronically Signed   By: Staci Righter M.D.   On: 06/15/2016 11:33   Mr Brain Wo Contrast  Result Date: 06/15/2016 CLINICAL DATA:  History of mini stroke years ago, numbness LEFT arm and face for 12 hours has now resolved. Stroke risk factors include hypertension and hyperlipidemia. EXAM: MRI HEAD WITHOUT CONTRAST MRA HEAD WITHOUT CONTRAST TECHNIQUE: Multiplanar, multiecho pulse sequences of the brain and surrounding structures were obtained without intravenous contrast. Angiographic images of the head were obtained using MRA technique without contrast. COMPARISON:  CT  head 06/14/2016. FINDINGS: MRI HEAD FINDINGS Brain: No evidence for acute infarction, hemorrhage, mass lesion, hydrocephalus, or extra-axial fluid. Mild atrophy. Minor white matter disease, nonspecific. Vascular: Flow voids are maintained throughout the carotid, basilar, and vertebral arteries. There are no areas of chronic hemorrhage. Skull and upper cervical spine: Unremarkable visualized calvarium, skullbase, and cervical vertebrae. Pituitary, pineal, cerebellar tonsils unremarkable. No upper cervical cord lesions. Sinuses/Orbits: Negative. Other: None. MRA HEAD FINDINGS The internal carotid arteries are widely patent. The basilar artery is widely patent. Artifactual signal loss  at the LEFT vertebral basilar confluence. LEFT vertebral is the sole contributor to the basilar with the RIGHT vertebral ending in PICA. No flow-limiting proximal intracranial stenosis or visible saccular aneurysm. IMPRESSION: Mild atrophy and small vessel disease. No acute intracranial findings. No proximal intracranial flow limiting stenosis. LEFT vertebral sole contributor to the basilar. Electronically Signed   By: Staci Righter M.D.   On: 06/15/2016 11:33   US Carotid Bilateral  Result Date: 06/15/2016 CLINICAL DATA:  TIA. Left arm numbness. Hypertension, syncope, hyperlipidemia. EXAM: BILATERAL CAROTID DUPLEX ULTRASOUND TECHNIQUE: Pearline Cables scale imaging, color Doppler and duplex ultrasound was performed of bilateral carotid and vertebral arteries in the neck. COMPARISON:  None. TECHNIQUE: Quantification of carotid stenosis is based on velocity parameters that correlate the residual internal carotid diameter with NASCET-based stenosis levels, using the diameter of the distal internal carotid lumen as the denominator for stenosis measurement. The following velocity measurements were obtained: PEAK SYSTOLIC/END DIASTOLIC RIGHT ICA:                     112/13cm/sec CCA:                     [id number] SYSTOLIC ICA/CCA RATIO:  0.8  DIASTOLIC ICA/CCA RATIO: 0.8 ECA:                     145cm/sec LEFT ICA:                     79/13cm/sec CCA:                     [id number] SYSTOLIC ICA/CCA RATIO:  [id number] DIASTOLIC ICA/CCA RATIO: [id number] ECA:                     131cm/sec FINDINGS: RIGHT CAROTID ARTERY: Intimal thickening through the common carotid artery. Eccentric partially calcified plaque in the bulb. No high-grade stenosis. Normal waveforms and color Doppler signal. Distal ICA mildly tortuous. RIGHT VERTEBRAL ARTERY:  Normal flow direction and waveform. LEFT CAROTID ARTERY: Minimal plaque in the carotid bulb. No high-grade stenosis. Normal waveforms and color Doppler signal. LEFT VERTEBRAL ARTERY: Normal flow direction and waveform. IMPRESSION: 1. Bilateral carotid bifurcation plaque right greater than left, resulting in less than 50% diameter stenosis. 2.  Antegrade bilateral vertebral arterial flow. Electronically Signed   By: Lucrezia Europe M.D.   On: 06/15/2016 10:38    EKG: Atrial Fib 50 RBBB    Assessment and Plan:  Atrial fibrillation-new onset  TIA  Coronary artery disease-remote  Complete heart block   Right bundle branch block  Renal insufficiency grade 3  Sleep disordered breathing  Right ventricular enlargement and mild dysfunction    The patient had a CVA/TIA over the weekend. There was a recurrent left hand event yesterday. The timing of recurrent risk his highest immediately post prior event. Hence, we will begin anticoagulation. We will discontinue aspirin.  We discussed the use of the NOACs compared to Coumadin. We briefly reviewed the data of at least comparability in stroke prevention, bleeding and outcome. We discussed some of the new once wherein somewhat associated with decreased ischemic stroke risk, one to be taken daily, and has been shown to be comparable and bleeding risk to aspirin.  We also discussed bleeding associated with warfarin as well as NOACs and a wall bleeding as a complication  of all these drugs intracranial bleeding is more frequently associated with warfarin then the NOACs  and a GI bleeding is more commonly associated with the latter  We have reviewed data for [facility] including [place]. We will discontinue his aspirin and begin him on apixoban 2.5 mg twice daily (age greater than 33, creatinine 1.5)  He has complete heart block manifesting as progressive conduction system disease. ECG 2014 sinus rhythm first-degree AV block and right bundle branch block ECG 2016 3:2 second-degree type I AV block and now complete heart block WITH the same QRS morphology. This suggests that the complete heart block is junctional. Not withstanding given the bradycardia pacing is indicated. The benefits and risks were reviewed including but not limited to death,  perforation, infection, lead dislodgement and device malfunction.  The patient understands agrees and is willing to proceed.   He has complaints of discomfort with more rapid heartbeats. It is possible that his inherent bradycardia has masked progression of his known coronary artery disease and that this will be uncovered with pacing for this pacing.  We discussed statins in the context of his prior stroke in the SPARCL trial we will continue him on his higher dose.  His right sided chamber enlargements in the context of snoring suggest sleep apnea and with his mild pulmonary hypertension would suggest a sleep study.     Virl Axe

## 2016-06-18 NOTE — Patient Instructions (Addendum)
Medication Instructions: - Your physician has recommended you make the following change in your medication: 1) Start Eliquis 2.5 mg- take one tablet by mouth twice daily 2) Stop Aspirin  Labwork: - none ordered  Procedures/Testing: - Your physician has recommended that you have a pacemaker inserted. A pacemaker is a small device that is placed under the skin of your chest or abdomen to help control abnormal heart rhythms. This device uses electrical pulses to prompt the heart to beat at a normal rate. Pacemakers are used to treat heart rhythms that are too slow. Wire (leads) are attached to the pacemaker that goes into the chambers of you heart. This is done in the hospital and usually requires and overnight stay. Please see the instruction sheet given to you today for more information.  Follow-Up: - Your physician recommends that you schedule a follow-up appointment in: about 14 days (from 06/19/16) with the [facility] nurse for a wound check.  - Your physician recommends that you schedule a follow-up appointment: at least 91 days (from 06/19/16) with Dr. Caryl Comes.    Any Additional Special Instructions Will Be Listed Below (If Applicable).     If you need a refill on your cardiac medications before your next appointment, please call your pharmacy.

## 2016-06-19 ENCOUNTER — Ambulatory Visit (HOSPITAL_COMMUNITY)
Admission: RE | Admit: 2016-06-19 | Discharge: 2016-06-20 | Disposition: A | Discharge status: Home / Self Care | Payer: Medicare Other | Source: Ambulatory Visit | Attending: Internal Medicine | Admitting: Internal Medicine

## 2016-06-19 ENCOUNTER — Encounter (HOSPITAL_COMMUNITY)
Admission: RE | Disposition: A | Discharge status: Home / Self Care | Payer: Self-pay | Source: Ambulatory Visit | Attending: Internal Medicine

## 2016-06-19 ENCOUNTER — Encounter (HOSPITAL_COMMUNITY): Payer: Self-pay | Admitting: General Practice

## 2016-06-19 ENCOUNTER — Ambulatory Visit: Payer: Medicare Other | Admitting: Anesthesiology

## 2016-06-19 DIAGNOSIS — I272 Pulmonary hypertension, unspecified: Secondary | ICD-10-CM | POA: Diagnosis not present

## 2016-06-19 DIAGNOSIS — I480 Paroxysmal atrial fibrillation: Secondary | ICD-10-CM | POA: Insufficient documentation

## 2016-06-19 DIAGNOSIS — I493 Ventricular premature depolarization: Secondary | ICD-10-CM | POA: Insufficient documentation

## 2016-06-19 DIAGNOSIS — I2510 Atherosclerotic heart disease of native coronary artery without angina pectoris: Secondary | ICD-10-CM | POA: Diagnosis not present

## 2016-06-19 DIAGNOSIS — Z880 Allergy status to penicillin: Secondary | ICD-10-CM | POA: Diagnosis not present

## 2016-06-19 DIAGNOSIS — Z8249 Family history of ischemic heart disease and other diseases of the circulatory system: Secondary | ICD-10-CM | POA: Insufficient documentation

## 2016-06-19 DIAGNOSIS — I481 Persistent atrial fibrillation: Secondary | ICD-10-CM | POA: Insufficient documentation

## 2016-06-19 DIAGNOSIS — I4510 Unspecified right bundle-branch block: Secondary | ICD-10-CM | POA: Insufficient documentation

## 2016-06-19 DIAGNOSIS — I442 Atrioventricular block, complete: Secondary | ICD-10-CM | POA: Diagnosis present

## 2016-06-19 DIAGNOSIS — I10 Essential (primary) hypertension: Secondary | ICD-10-CM | POA: Insufficient documentation

## 2016-06-19 DIAGNOSIS — E785 Hyperlipidemia, unspecified: Secondary | ICD-10-CM | POA: Insufficient documentation

## 2016-06-19 DIAGNOSIS — Z91041 Radiographic dye allergy status: Secondary | ICD-10-CM | POA: Diagnosis not present

## 2016-06-19 DIAGNOSIS — N289 Disorder of kidney and ureter, unspecified: Secondary | ICD-10-CM | POA: Diagnosis not present

## 2016-06-19 DIAGNOSIS — Z7982 Long term (current) use of aspirin: Secondary | ICD-10-CM | POA: Diagnosis not present

## 2016-06-19 DIAGNOSIS — I6529 Occlusion and stenosis of unspecified carotid artery: Secondary | ICD-10-CM | POA: Insufficient documentation

## 2016-06-19 DIAGNOSIS — Z959 Presence of cardiac and vascular implant and graft, unspecified: Secondary | ICD-10-CM

## 2016-06-19 DIAGNOSIS — Z8673 Personal history of transient ischemic attack (TIA), and cerebral infarction without residual deficits: Secondary | ICD-10-CM | POA: Insufficient documentation

## 2016-06-19 DIAGNOSIS — Z950 Presence of cardiac pacemaker: Secondary | ICD-10-CM

## 2016-06-19 DIAGNOSIS — G4730 Sleep apnea, unspecified: Secondary | ICD-10-CM | POA: Diagnosis not present

## 2016-06-19 HISTORY — DX: Dorsalgia, unspecified: M54.9

## 2016-06-19 HISTORY — DX: Personal history of other diseases of the musculoskeletal system and connective tissue: Z87.39

## 2016-06-19 HISTORY — DX: Atrioventricular block, complete: I44.2

## 2016-06-19 HISTORY — DX: Transient cerebral ischemic attack, unspecified: G45.9

## 2016-06-19 HISTORY — DX: Other chronic pain: G89.29

## 2016-06-19 HISTORY — PX: PACEMAKER IMPLANT: EP1218

## 2016-06-19 HISTORY — DX: Unspecified osteoarthritis, unspecified site: M19.90

## 2016-06-19 HISTORY — DX: Gastro-esophageal reflux disease without esophagitis: K21.9

## 2016-06-19 HISTORY — DX: Spinal stenosis, site unspecified: M48.00

## 2016-06-19 HISTORY — DX: Reserved for concepts with insufficient information to code with codable children: IMO0002

## 2016-06-19 HISTORY — DX: Presence of cardiac pacemaker: Z95.0

## 2016-06-19 HISTORY — DX: Nonspecific intraventricular block: I45.4

## 2016-06-19 HISTORY — DX: Migraine, unspecified, not intractable, without status migrainosus: G43.909

## 2016-06-19 HISTORY — DX: Basal cell carcinoma of skin, unspecified: C44.91

## 2016-06-19 HISTORY — DX: Personal history of other diseases of the digestive system: Z87.19

## 2016-06-19 HISTORY — PX: INSERT / REPLACE / REMOVE PACEMAKER: SUR710

## 2016-06-19 HISTORY — DX: Malignant neoplasm of left kidney, except renal pelvis: C64.2

## 2016-06-19 LAB — SURGICAL PCR SCREEN
MRSA, PCR: NEGATIVE
Staphylococcus aureus: NEGATIVE

## 2016-06-19 SURGERY — PACEMAKER IMPLANT
Anesthesia: LOCAL

## 2016-06-19 MED ORDER — CELECOXIB 200 MG PO CAPS
200.0000 mg | ORAL_CAPSULE | Freq: Every day | ORAL | Status: DC | PRN
  Administered 2016-06-19: 21:00:00 200 mg via ORAL
  Filled 2016-06-19 (×3): qty 1

## 2016-06-19 MED ORDER — APIXABAN 2.5 MG PO TABS
2.5000 mg | ORAL_TABLET | Freq: Two times a day (BID) | ORAL | Status: DC
Start: 2016-06-19 — End: 2016-06-20
  Administered 2016-06-19 – 2016-06-20 (×2): 2.5 mg via ORAL
  Filled 2016-06-19 (×2): qty 1

## 2016-06-19 MED ORDER — MIDAZOLAM HCL 5 MG/5ML IJ SOLN
INTRAMUSCULAR | Status: AC
  Filled 2016-06-19: qty 5

## 2016-06-19 MED ORDER — GARLIC 10 MG PO CAPS: 10.0000 mg | ORAL_CAPSULE | Freq: Every day | ORAL | Status: DC

## 2016-06-19 MED ORDER — FEBUXOSTAT 40 MG PO TABS: 20.0000 mg | ORAL_TABLET | Freq: Every day | ORAL | Status: DC

## 2016-06-19 MED ORDER — COENZYME Q10 30 MG PO CAPS: 30.0000 mg | ORAL_CAPSULE | Freq: Three times a day (TID) | ORAL | Status: DC

## 2016-06-19 MED ORDER — LISINOPRIL 5 MG PO TABS
5.0000 mg | ORAL_TABLET | Freq: Every day | ORAL | Status: DC
  Administered 2016-06-20: 08:00:00 5 mg via ORAL
  Filled 2016-06-19: qty 1

## 2016-06-19 MED ORDER — FEBUXOSTAT 40 MG PO TABS: 20.0000 mg | ORAL_TABLET | ORAL | Status: DC

## 2016-06-19 MED ORDER — MUPIROCIN 2 % EX OINT
TOPICAL_OINTMENT | CUTANEOUS | Status: AC
  Administered 2016-06-19: 1 via TOPICAL
  Filled 2016-06-19: qty 22

## 2016-06-19 MED ORDER — SODIUM CHLORIDE 0.9 % IV SOLN
INTRAVENOUS | Status: AC
  Administered 2016-06-19: 18:00:00 via INTRAVENOUS

## 2016-06-19 MED ORDER — ONDANSETRON HCL 4 MG/2ML IJ SOLN: 4.0000 mg | Freq: Four times a day (QID) | INTRAMUSCULAR | Status: DC | PRN

## 2016-06-19 MED ORDER — LIDOCAINE HCL (PF) 1 % IJ SOLN
INTRAMUSCULAR | Status: DC | PRN
  Administered 2016-06-19: 35 mL via INTRADERMAL

## 2016-06-19 MED ORDER — ACETAMINOPHEN 325 MG PO TABS
325.0000 mg | ORAL_TABLET | ORAL | Status: DC | PRN
  Administered 2016-06-19: 650 mg via ORAL
  Filled 2016-06-19: qty 2

## 2016-06-19 MED ORDER — VANCOMYCIN HCL IN DEXTROSE 1-5 GM/200ML-% IV SOLN
1000.0000 mg | Freq: Two times a day (BID) | INTRAVENOUS | Status: AC
  Administered 2016-06-20: 1000 mg via INTRAVENOUS
  Filled 2016-06-19: qty 200

## 2016-06-19 MED ORDER — FENTANYL CITRATE (PF) 100 MCG/2ML IJ SOLN
INTRAMUSCULAR | Status: DC | PRN
  Administered 2016-06-19 (×2): 25 ug via INTRAVENOUS
  Administered 2016-06-19: 12.5 ug via INTRAVENOUS

## 2016-06-19 MED ORDER — YOU HAVE A PACEMAKER BOOK
Freq: Once | Status: AC
  Administered 2016-06-20: 1
  Filled 2016-06-19: qty 1

## 2016-06-19 MED ORDER — CHLORHEXIDINE GLUCONATE 4 % EX LIQD
60.0000 mL | Freq: Once | CUTANEOUS | Status: DC
Start: 2016-06-19 — End: 2016-06-19
  Filled 2016-06-19: qty 60

## 2016-06-19 MED ORDER — DOXYCYCLINE HYCLATE 100 MG PO TABS
100.0000 mg | ORAL_TABLET | Freq: Two times a day (BID) | ORAL | Status: DC
  Administered 2016-06-19 – 2016-06-20 (×2): 100 mg via ORAL
  Filled 2016-06-19 (×2): qty 1

## 2016-06-19 MED ORDER — PROBIOTIC 250 MG PO CAPS: 1.0000 | ORAL_CAPSULE | ORAL | Status: DC

## 2016-06-19 MED ORDER — VANCOMYCIN HCL IN DEXTROSE 1-5 GM/200ML-% IV SOLN
INTRAVENOUS | Status: AC
  Filled 2016-06-19: qty 200

## 2016-06-19 MED ORDER — VITAMIN E 180 MG (400 UNIT) PO CAPS
400.0000 [IU] | ORAL_CAPSULE | Freq: Every day | ORAL | Status: DC
  Administered 2016-06-20: 08:00:00 400 [IU] via ORAL
  Filled 2016-06-19 (×2): qty 1

## 2016-06-19 MED ORDER — SODIUM CHLORIDE 0.9 % IR SOLN
80.0000 mg | Status: AC
  Administered 2016-06-19: 80 mg
  Filled 2016-06-19: qty 2

## 2016-06-19 MED ORDER — SODIUM CHLORIDE 0.9 % IV SOLN
INTRAVENOUS | Status: DC
  Administered 2016-06-19: 14:00:00 via INTRAVENOUS

## 2016-06-19 MED ORDER — ATORVASTATIN CALCIUM 40 MG PO TABS
40.0000 mg | ORAL_TABLET | Freq: Every day | ORAL | Status: DC
  Administered 2016-06-19: 19:00:00 40 mg via ORAL
  Filled 2016-06-19: qty 1

## 2016-06-19 MED ORDER — MAGNESIUM OXIDE 250 MG PO TABS: 250.0000 mg | ORAL_TABLET | Freq: Every day | ORAL | Status: DC

## 2016-06-19 MED ORDER — MUPIROCIN 2 % EX OINT
1.0000 "application " | TOPICAL_OINTMENT | Freq: Once | CUTANEOUS | Status: AC
  Administered 2016-06-19: 1 via TOPICAL
  Filled 2016-06-19: qty 22

## 2016-06-19 MED ORDER — MIDAZOLAM HCL 5 MG/5ML IJ SOLN
INTRAMUSCULAR | Status: DC | PRN
  Administered 2016-06-19 (×3): 1 mg via INTRAVENOUS

## 2016-06-19 MED ORDER — LIDOCAINE HCL (PF) 1 % IJ SOLN
INTRAMUSCULAR | Status: AC
  Filled 2016-06-19: qty 60

## 2016-06-19 MED ORDER — HEPARIN (PORCINE) IN NACL 2-0.9 UNIT/ML-% IJ SOLN
INTRAMUSCULAR | Status: AC
  Filled 2016-06-19: qty 500

## 2016-06-19 MED ORDER — HYDRALAZINE HCL 25 MG PO TABS: 25.0000 mg | ORAL_TABLET | Freq: Three times a day (TID) | ORAL | Status: DC | PRN

## 2016-06-19 MED ORDER — MAGNESIUM OXIDE 400 (241.3 MG) MG PO TABS
200.0000 mg | ORAL_TABLET | Freq: Every day | ORAL | Status: DC
  Administered 2016-06-20: 200 mg via ORAL
  Filled 2016-06-19: qty 1

## 2016-06-19 MED ORDER — HEPARIN (PORCINE) IN NACL 2-0.9 UNIT/ML-% IJ SOLN
INTRAMUSCULAR | Status: DC | PRN
  Administered 2016-06-19: 16:00:00

## 2016-06-19 MED ORDER — FENTANYL CITRATE (PF) 100 MCG/2ML IJ SOLN
INTRAMUSCULAR | Status: AC
  Filled 2016-06-19: qty 2

## 2016-06-19 MED ORDER — RISAQUAD PO CAPS
1.0000 | ORAL_CAPSULE | ORAL | Status: DC
  Filled 2016-06-19: qty 1

## 2016-06-19 MED ORDER — OFF THE BEAT BOOK
Freq: Once | Status: AC
  Administered 2016-06-20: 1
  Filled 2016-06-19: qty 1

## 2016-06-19 MED ORDER — GENTAMICIN SULFATE 40 MG/ML IJ SOLN
INTRAMUSCULAR | Status: AC
  Filled 2016-06-19: qty 2

## 2016-06-19 MED ORDER — VANCOMYCIN HCL IN DEXTROSE 1-5 GM/200ML-% IV SOLN
1000.0000 mg | INTRAVENOUS | Status: AC
  Administered 2016-06-19: 1000 mg via INTRAVENOUS
  Filled 2016-06-19: qty 200

## 2016-06-19 SURGICAL SUPPLY — 10 items
CABLE SURGICAL S-101-97-12 (CABLE) ×2 IMPLANT
HEMOSTAT SURGICEL 2X4 FIBR (HEMOSTASIS) ×2 IMPLANT
IPG PACE AZUR XT DR MRI W1DR01 (Pacemaker) ×1 IMPLANT
LEAD CAPSURE NOVUS 5076-52CM (Lead) ×2 IMPLANT
LEAD CAPSURE NOVUS 5076-58CM (Lead) ×2 IMPLANT
PACE AZURE XT DR MRI W1DR01 (Pacemaker) ×2 IMPLANT
PAD DEFIB LIFELINK (PAD) ×2 IMPLANT
SET INTRODUCER MICROPUNCT 5F (INTRODUCER) ×2 IMPLANT
SHEATH CLASSIC 7F (SHEATH) ×4 IMPLANT
TRAY PACEMAKER INSERTION (PACKS) ×2 IMPLANT

## 2016-06-19 NOTE — H&P (View-Only) (Signed)
ELECTROPHYSIOLOGY CONSULT NOTE  Patient ID: Thomas [name], MRN: [id number], DOB/AGE: 1932/08/21 83 y.o. Admit date: (Not on file) Date of Consult: 06/18/2016  Primary Physician: [name], MD Primary Cardiologist: [facility]  TG  Chief Complaint: Complete Heart Block   HPI MIKAIAH [name] is a 83 y.o. male  referred following admission 2/18 for L numbness concerning for TIA/CVA   MRI neg  at that time he was noted to be in complete heart block. He has a long-standing history of progressive conduction system disease. In 2014 and first degree AV block 2016 3:2 second-degree AV block type I.  He has had no significant limitations in activity although even when he runs he has restricted heart rate excursion.   He has had some episodes of lightheadedness. His wife is more concerned about his symptoms then is seen. He has had no syncope  He thinks that he does better when his heart rate is below 50. His ability to describe the symptoms associated with a faster heart rate are not consistent. He describes both a pumping, as well as a chest pressure.  Echo LVEF 55-60%  Mild RV dysfunction   ECG NSR with 3:2 AV block with RBBB    Cath 1990s Demonstrating high-grade disease in a small diagonal   Thromboembolic risk profile age-24 TIA-2 hypertension-1 vascular disease-1 for a CHADS-VASc score of greater than or equal to 6   He has sleep disordered breathing  Past Medical History:  Diagnosis Date  . Aortic valve disorders   . Heart block   . Hyperlipidemia   . Hypertension   . Lyme disease   . PVC's (premature ventricular contractions)       Surgical History:  Past Surgical History:  Procedure Laterality Date  . ANKLE SURGERY     right ankle fracture  . BIOPSY PROSTATE  2001 & 2003   x 2  . CARDIAC CATHETERIZATION  1990's  . HERNIA REPAIR  2012  . LAPAROSCOPIC ABLATION RENAL MASS    . PROSTATE SURGERY    . TONSILLECTOMY AND ADENOIDECTOMY       Home  Meds: Prior to Admission medications   Medication Sig Start Date End Date Taking? Authorizing Provider  Artemether-Lumefantrine (COARTEM PO) Take by mouth 2 (two) times daily.    Historical Provider, MD  aspirin 81 MG tablet Take by mouth. 07/24/12   Historical Provider, MD  atorvastatin (LIPITOR) 40 MG tablet Take 1 tablet (40 mg total) by mouth daily at 6 PM. 06/15/16   Gladstone Lighter, MD  co-enzyme Q-10 30 MG capsule Take 30 mg by mouth 3 (three) times daily.    Historical Provider, MD  COLCRYS 0.6 MG tablet Take 0.6 mg by mouth as needed.  10/07/12   Historical Provider, MD  diclofenac sodium (VOLTAREN) 1 % GEL Place onto the skin. 01/20/14   Historical Provider, MD  doxycycline (VIBRA-TABS) 100 MG tablet Take 100 mg by mouth 2 (two) times daily.    Historical Provider, MD  dutasteride (AVODART) 0.5 MG capsule Take 1 mg by mouth as needed.     Historical Provider, MD  esomeprazole (NEXIUM) 40 MG capsule Take 40 mg by mouth daily as needed.     Historical Provider, MD  febuxostat (ULORIC) 40 MG tablet Take 10 mg by mouth daily.    Historical Provider, MD  Garlic 10 MG CAPS Take by mouth.    Historical Provider, MD  hydrALAZINE (APRESOLINE) 25 MG tablet Take 1 tablet (  25 mg total) by mouth 3 (three) times daily as needed. 09/14/15   Minna Merritts, MD  lisinopril (PRINIVIL,ZESTRIL) 5 MG tablet Take 1 tablet (5 mg total) by mouth daily. 12/05/15   Minna Merritts, MD  MULTIPLE VITAMIN PO Take by mouth. 07/24/12   Historical Provider, MD  Omega-3 Fatty Acids (FISH OIL) 1000 MG CAPS Take 1,000 mg by mouth 1 dose over 24 hours.      Historical Provider, MD  Saccharomyces boulardii (PROBIOTIC) 250 MG CAPS Take by mouth.    Historical Provider, MD  temazepam (RESTORIL) 30 MG capsule Take by mouth at bedtime as needed.  01/20/14   Historical Provider, MD  vitamin E 400 UNIT capsule Take 400 Units by mouth daily.    Historical Provider, MD    Allergies:  Allergies  Allergen Reactions  . Iodine      IVP contrast  . Penicillins     Social History   Social History  . Marital status: Married    Spouse name: N/A  . Number of children: N/A  . Years of education: N/A   Occupational History  . Not on file.   Social History Main Topics  . Smoking status: Never Smoker  . Smokeless tobacco: Never Used  . Alcohol use 1.2 oz/week    1 Glasses of wine, 1 Cans of beer per week  . Drug use: No  . Sexual activity: Not Currently   Other Topics Concern  . Not on file   Social History Narrative   Independent at baseline. Lives with family     Family History  Problem Relation Age of Onset  . Heart attack Brother   . Aortic stenosis Mother   . Arthritis Father      ROS:  Please see the history of present illness.     All other systems reviewed and negative.    Physical Exam:  Blood pressure 138/82, pulse (!) 50, height 5\' 7"  (1.702 m), weight 174 lb 8 oz (79.2 kg). General: Well developed, well nourished male in no acute distress. Head: Normocephalic, atraumatic, sclera non-icteric, no xanthomas, nares are without discharge. EENT: normal  Lymph Nodes:  none Neck: Negative for carotid bruits. JVD not elevated. Back:without scoliosis kyphosis  Lungs: Clear bilaterally to auscultation without wheezes, rales, or rhonchi. Breathing is unlabored.  Heart: Slow and irregular rhythm with a 2/6*murmur . No rubs, or gallops appreciated. Abdomen: Soft, non-tender, non-distended with normoactive bowel sounds. No hepatomegaly. No rebound/guarding. No obvious abdominal masses. Msk:  Strength and tone appear normal for age. Extremities: No clubbing or cyanosis. No  edema.  Distal pedal pulses are 2+ and equal bilaterally. Skin: Warm and Dry Neuro: Alert and oriented X 3. CN III-XII intact Grossly normal sensory and motor function . Psych:  Responds to questions appropriately with a normal affect.      Labs: Cardiac Enzymes No results for input(s): CKTOTAL, CKMB, TROPONINI in the last 72  hours. CBC Lab Results  Component Value Date   WBC 6.6 06/14/2016   HGB 15.4 06/14/2016   HCT 47.8 06/14/2016   MCV 90.3 06/14/2016   PLT 175 06/14/2016   PROTIME: No results for input(s): LABPROT, INR in the last 72 hours. Chemistry   Recent Labs Lab 06/14/16 1346  NA 134*  K 5.1  CL 101  CO2 28  BUN 22*  CREATININE 1.49*  CALCIUM 9.7  PROT 7.1  BILITOT 0.8  ALKPHOS 64  ALT 18  AST 25  GLUCOSE 86  Lipids Lab Results  Component Value Date   CHOL 142 06/14/2016   HDL 59 06/14/2016   LDLCALC 75 06/14/2016   TRIG 38 06/14/2016   BNP No results found for: PROBNP Thyroid Function Tests: No results for input(s): TSH, T4TOTAL, T3FREE, THYROIDAB in the last 72 hours.  Invalid input(s): FREET3 Miscellaneous No results found for: DDIMER  Radiology/Studies:  Ct Head Wo Contrast  Result Date: 06/14/2016 CLINICAL DATA:  Dizziness.  Headache and extremity weakness. EXAM: CT HEAD WITHOUT CONTRAST TECHNIQUE: Contiguous axial images were obtained from the base of the skull through the vertex without intravenous contrast. COMPARISON:  None. FINDINGS: Brain: No subdural, epidural, or subarachnoid hemorrhage. Cerebellum, brainstem, and basal cisterns are normal. Ventricles and sulci are unremarkable for age. Mild scattered white matter changes. No acute cortical ischemia or infarct. No mass, mass effect, or midline shift. Vascular: Calcified atherosclerosis in the intracranial carotid arteries. Skull: Normal. Negative for fracture or focal lesion. Sinuses/Orbits: No acute finding. Other: None. IMPRESSION: No acute intracranial abnormality. Findings called to Dr. Clearnce Hasten Electronically Signed   By: Dorise Bullion III M.D   On: 06/14/2016 13:53   Mr Jodene Nam Head Wo Contrast  Result Date: 06/15/2016 CLINICAL DATA:  History of mini stroke years ago, numbness LEFT arm and face for 12 hours has now resolved. Stroke risk factors include hypertension and hyperlipidemia. EXAM: MRI HEAD  WITHOUT CONTRAST MRA HEAD WITHOUT CONTRAST TECHNIQUE: Multiplanar, multiecho pulse sequences of the brain and surrounding structures were obtained without intravenous contrast. Angiographic images of the head were obtained using MRA technique without contrast. COMPARISON:  CT head 06/14/2016. FINDINGS: MRI HEAD FINDINGS Brain: No evidence for acute infarction, hemorrhage, mass lesion, hydrocephalus, or extra-axial fluid. Mild atrophy. Minor white matter disease, nonspecific. Vascular: Flow voids are maintained throughout the carotid, basilar, and vertebral arteries. There are no areas of chronic hemorrhage. Skull and upper cervical spine: Unremarkable visualized calvarium, skullbase, and cervical vertebrae. Pituitary, pineal, cerebellar tonsils unremarkable. No upper cervical cord lesions. Sinuses/Orbits: Negative. Other: None. MRA HEAD FINDINGS The internal carotid arteries are widely patent. The basilar artery is widely patent. Artifactual signal loss at the LEFT vertebral basilar confluence. LEFT vertebral is the sole contributor to the basilar with the RIGHT vertebral ending in PICA. No flow-limiting proximal intracranial stenosis or visible saccular aneurysm. IMPRESSION: Mild atrophy and small vessel disease. No acute intracranial findings. No proximal intracranial flow limiting stenosis. LEFT vertebral sole contributor to the basilar. Electronically Signed   By: Staci Righter M.D.   On: 06/15/2016 11:33   Mr Brain Wo Contrast  Result Date: 06/15/2016 CLINICAL DATA:  History of mini stroke years ago, numbness LEFT arm and face for 12 hours has now resolved. Stroke risk factors include hypertension and hyperlipidemia. EXAM: MRI HEAD WITHOUT CONTRAST MRA HEAD WITHOUT CONTRAST TECHNIQUE: Multiplanar, multiecho pulse sequences of the brain and surrounding structures were obtained without intravenous contrast. Angiographic images of the head were obtained using MRA technique without contrast. COMPARISON:  CT  head 06/14/2016. FINDINGS: MRI HEAD FINDINGS Brain: No evidence for acute infarction, hemorrhage, mass lesion, hydrocephalus, or extra-axial fluid. Mild atrophy. Minor white matter disease, nonspecific. Vascular: Flow voids are maintained throughout the carotid, basilar, and vertebral arteries. There are no areas of chronic hemorrhage. Skull and upper cervical spine: Unremarkable visualized calvarium, skullbase, and cervical vertebrae. Pituitary, pineal, cerebellar tonsils unremarkable. No upper cervical cord lesions. Sinuses/Orbits: Negative. Other: None. MRA HEAD FINDINGS The internal carotid arteries are widely patent. The basilar artery is widely patent. Artifactual signal loss  at the LEFT vertebral basilar confluence. LEFT vertebral is the sole contributor to the basilar with the RIGHT vertebral ending in PICA. No flow-limiting proximal intracranial stenosis or visible saccular aneurysm. IMPRESSION: Mild atrophy and small vessel disease. No acute intracranial findings. No proximal intracranial flow limiting stenosis. LEFT vertebral sole contributor to the basilar. Electronically Signed   By: Staci Righter M.D.   On: 06/15/2016 11:33   US Carotid Bilateral  Result Date: 06/15/2016 CLINICAL DATA:  TIA. Left arm numbness. Hypertension, syncope, hyperlipidemia. EXAM: BILATERAL CAROTID DUPLEX ULTRASOUND TECHNIQUE: Pearline Cables scale imaging, color Doppler and duplex ultrasound was performed of bilateral carotid and vertebral arteries in the neck. COMPARISON:  None. TECHNIQUE: Quantification of carotid stenosis is based on velocity parameters that correlate the residual internal carotid diameter with NASCET-based stenosis levels, using the diameter of the distal internal carotid lumen as the denominator for stenosis measurement. The following velocity measurements were obtained: PEAK SYSTOLIC/END DIASTOLIC RIGHT ICA:                     112/13cm/sec CCA:                     [id number] SYSTOLIC ICA/CCA RATIO:  0.8  DIASTOLIC ICA/CCA RATIO: 0.8 ECA:                     145cm/sec LEFT ICA:                     79/13cm/sec CCA:                     [id number] SYSTOLIC ICA/CCA RATIO:  [id number] DIASTOLIC ICA/CCA RATIO: [id number] ECA:                     131cm/sec FINDINGS: RIGHT CAROTID ARTERY: Intimal thickening through the common carotid artery. Eccentric partially calcified plaque in the bulb. No high-grade stenosis. Normal waveforms and color Doppler signal. Distal ICA mildly tortuous. RIGHT VERTEBRAL ARTERY:  Normal flow direction and waveform. LEFT CAROTID ARTERY: Minimal plaque in the carotid bulb. No high-grade stenosis. Normal waveforms and color Doppler signal. LEFT VERTEBRAL ARTERY: Normal flow direction and waveform. IMPRESSION: 1. Bilateral carotid bifurcation plaque right greater than left, resulting in less than 50% diameter stenosis. 2.  Antegrade bilateral vertebral arterial flow. Electronically Signed   By: Lucrezia Europe M.D.   On: 06/15/2016 10:38    EKG: Atrial Fib 50 RBBB    Assessment and Plan:  Atrial fibrillation-new onset  TIA  Coronary artery disease-remote  Complete heart block   Right bundle branch block  Renal insufficiency grade 3  Sleep disordered breathing  Right ventricular enlargement and mild dysfunction    The patient had a CVA/TIA over the weekend. There was a recurrent left hand event yesterday. The timing of recurrent risk his highest immediately post prior event. Hence, we will begin anticoagulation. We will discontinue aspirin.  We discussed the use of the NOACs compared to Coumadin. We briefly reviewed the data of at least comparability in stroke prevention, bleeding and outcome. We discussed some of the new once wherein somewhat associated with decreased ischemic stroke risk, one to be taken daily, and has been shown to be comparable and bleeding risk to aspirin.  We also discussed bleeding associated with warfarin as well as NOACs and a wall bleeding as a complication  of all these drugs intracranial bleeding is more frequently associated with warfarin then the NOACs  and a GI bleeding is more commonly associated with the latter  We have reviewed data for [facility] including [place]. We will discontinue his aspirin and begin him on apixoban 2.5 mg twice daily (age greater than 74, creatinine 1.5)  He has complete heart block manifesting as progressive conduction system disease. ECG 2014 sinus rhythm first-degree AV block and right bundle branch block ECG 2016 3:2 second-degree type I AV block and now complete heart block WITH the same QRS morphology. This suggests that the complete heart block is junctional. Not withstanding given the bradycardia pacing is indicated. The benefits and risks were reviewed including but not limited to death,  perforation, infection, lead dislodgement and device malfunction.  The patient understands agrees and is willing to proceed.   He has complaints of discomfort with more rapid heartbeats. It is possible that his inherent bradycardia has masked progression of his known coronary artery disease and that this will be uncovered with pacing for this pacing.  We discussed statins in the context of his prior stroke in the SPARCL trial we will continue him on his higher dose.  His right sided chamber enlargements in the context of snoring suggest sleep apnea and with his mild pulmonary hypertension would suggest a sleep study.     Virl Axe

## 2016-06-19 NOTE — Discharge Summary (Signed)
ELECTROPHYSIOLOGY PROCEDURE DISCHARGE SUMMARY    Patient ID: Thomas [name],  MRN: [id number], DOB/AGE: [birth year] 83 y.o.  Admit date: 06/19/2016 Discharge date: 06/20/2016  Primary Care Physician: [name], MD Primary Cardiologist: Rockey Situ Electrophysiologist: Caryl Comes  Primary Discharge Diagnosis:  Symptomatic complete heart block status post pacemaker implantation this admission  Secondary Discharge Diagnosis:  1.  Prior TIA 2.  Paroxysmal atrial fibrillation 3.  Hypertension 4.  Hyperlipidemia  Allergies  Allergen Reactions  . Iodine Hives    IVP contrast  . Penicillins Itching    ITCHY FEELING IN FINGERS Has patient had a PCN reaction causing immediate rash, facial/tongue/throat swelling, SOB or lightheadedness with hypotension: No Has patient had a PCN reaction causing severe rash involving mucus membranes or skin necrosis: No Has patient had a PCN reaction that required hospitalization No Has patient had a PCN reaction occurring within the last 10 years: No If all of the above answers are "NO", then may proceed with Cephalosporin use.      Procedures This Admission:  1.  Implantation of a MDT dual chamber PPM on 06/19/16 by Dr Caryl Comes.  See op note for full details. There were no immediate post procedure complications. 2.  CXR on 06/20/16 demonstrated no pneumothorax status post device implantation.   Brief HPI: Thomas [name] is a 83 y.o. male with a past medical history as outlined above. He has had progressive heart block associated with exercise intolerance with no reversible causes identified.   Risks, benefits, and alternatives to PPM implantation were reviewed with the patient who wished to proceed.   Hospital Course:  The patient was admitted and underwent implantation of a MDT dual chamber pacemaker with details as outlined above.  He  was monitored on telemetry overnight which demonstrated sinus rhythm with ventricular pacing.  Left chest was without  hematoma or ecchymosis.  The device was interrogated and found to be functioning normally.  CXR was obtained and demonstrated no pneumothorax status post device implantation.  Wound care, arm mobility, and restrictions were reviewed with the patient.  The patient was examined and considered stable for discharge to home.   BP elevated this admission, per Dr Caryl Comes, will increase Lisinopril to 10mg  daily. He will need BMET and recheck of BP at wound check appointment.    This patients CHA2DS2-VASc Score and unadjusted Ischemic Stroke Rate (% per year) is equal to 7.2 % stroke rate/year from a score of 5 Above score calculated as 1 point each if present [CHF, HTN, DM, Vascular=MI/PAD/Aortic Plaque, Age if 65-74, or Male] Above score calculated as 2 points each if present [Age > 75, or Stroke/TIA/TE]  He is on low dose Eliquis for age >70, creat trend >1.5. Will need to monitor over time to see if he could be on 5mg  daily.    Physical Exam: Vitals:   06/19/16 2300 06/20/16 0000 06/20/16 0200 06/20/16 0400  BP: (!) 149/81 (!) 150/81 136/73   Pulse: 67 69 65 68  Resp: 17 16 16    Temp:      TempSrc:      SpO2: 97%  98%   Weight:      Height:        GEN- The patient is well appearing, alert and oriented x 3 today.   HEENT: normocephalic, atraumatic; sclera clear, conjunctiva pink; hearing intact; oropharynx clear; neck supple  Lungs- Clear to ausculation bilaterally, normal work of breathing.  No wheezes, rales, rhonchi Heart- Regular rate and rhythm  GI- soft, non-tender, non-distended, bowel sounds present  Extremities- no clubbing, cyanosis, or edema; DP/PT/radial pulses 2+ bilaterally MS- no significant deformity or atrophy Skin- warm and dry, no rash or lesion, left chest with pressure dressing in place Psych- euthymic mood, full affect Neuro- strength and sensation are intact   Labs:   Lab Results  Component Value Date   WBC 6.6 06/14/2016   HGB 15.4 06/14/2016   HCT 47.8  06/14/2016   MCV 90.3 06/14/2016   PLT 175 06/14/2016     Recent Labs Lab 06/14/16 1346  NA 134*  K 5.1  CL 101  CO2 28  BUN 22*  CREATININE 1.49*  CALCIUM 9.7  PROT 7.1  BILITOT 0.8  ALKPHOS 64  ALT 18  AST 25  GLUCOSE 86    Discharge Medications:  Allergies as of 06/20/2016      Reactions   Iodine Hives   IVP contrast   Penicillins Itching   ITCHY FEELING IN FINGERS Has patient had a PCN reaction causing immediate rash, facial/tongue/throat swelling, SOB or lightheadedness with hypotension: No Has patient had a PCN reaction causing severe rash involving mucus membranes or skin necrosis: No Has patient had a PCN reaction that required hospitalization No Has patient had a PCN reaction occurring within the last 10 years: No If all of the above answers are "NO", then may proceed with Cephalosporin use.      Medication List    TAKE these medications   apixaban 2.5 MG Tabs tablet Commonly known as:  ELIQUIS Take 1 tablet (2.5 mg total) by mouth 2 (two) times daily.   atorvastatin 40 MG tablet Commonly known as:  LIPITOR Take 1 tablet (40 mg total) by mouth daily at 6 PM.   B-COMPLEX/B-12 PO Take 1 tablet by mouth daily.   celecoxib 200 MG capsule Commonly known as:  CELEBREX Take 200 mg by mouth daily as needed for mild pain or moderate pain.   co-enzyme Q-10 30 MG capsule Take 30 mg by mouth 3 (three) times daily.   COARTEM PO Take 4 tablets by mouth 2 (two) times daily.   COLCRYS 0.6 MG tablet Generic drug:  colchicine Take 0.6 mg by mouth as needed.   doxycycline 100 MG tablet Commonly known as:  VIBRA-TABS Take 100 mg by mouth 2 (two) times daily.   esomeprazole 40 MG capsule Commonly known as:  NEXIUM Take 40 mg by mouth daily as needed.   febuxostat 40 MG tablet Commonly known as:  ULORIC Take 10 mg by mouth daily. TAKES 0.25 TABLET  DAILY   Fish Oil 1000 MG Caps Take 1,000 mg by mouth daily.   Garlic 10 MG Caps Take 10 mg by mouth  daily.   hydrALAZINE 25 MG tablet Commonly known as:  APRESOLINE Take 1 tablet (25 mg total) by mouth 3 (three) times daily as needed. What changed:  additional instructions   lisinopril 10 MG tablet Commonly known as:  PRINIVIL,ZESTRIL Take 1 tablet (10 mg total) by mouth daily. What changed:  medication strength  how much to take   Magnesium Oxide 250 MG Tabs Take 250 mg by mouth daily.   MULTIPLE VITAMIN PO Take 1 tablet by mouth daily.   OVER THE COUNTER MEDICATION Take 1 tablet by mouth 2 (two) times daily. Supplement for Immune Support for Lyme Disease DIM   OVER THE COUNTER MEDICATION Take 5 mLs by mouth 3 (three) times daily. Lauracidine Immune Support for Lyme Disease   OVER THE COUNTER  MEDICATION Take 1 tablet by mouth daily. Betaglucin   PRESERVISION AREDS PO Take 1 tablet by mouth daily.   Probiotic 250 MG Caps Take 1 tablet by mouth See admin instructions. TAKES 3 HOURS AFTER DOXYCYCLINE   TURMERIC PO Take 1 tablet by mouth 2 (two) times daily.   vitamin E 400 UNIT capsule Take 400 Units by mouth daily.   VOLTAREN 1 % Gel Generic drug:  diclofenac sodium Place 2 g onto the skin daily as needed (PAIN).   ZINC PO Take 1 tablet by mouth daily.       Disposition:  Discharge Instructions    Diet - low sodium heart healthy    Complete by:  As directed    Increase activity slowly    Complete by:  As directed      Follow-up Information    [facility] Follow up on 07/04/2016.   Specialty:  Cardiology Why:  at [facility] for wound check  Contact information: [address], [address] [place]       Virl Axe, MD Follow up on 09/17/2016.   Specialty:  Cardiology Why:  at 9:45AM  Contact information: [address] [place] [address] [phone]           Duration of Discharge Encounter: Greater than 30 minutes including physician time.  Signed, Chanetta Marshall, NP 06/20/2016 7:22 AM

## 2016-06-19 NOTE — Interval H&P Note (Signed)
History and Physical Interval Note:  06/19/2016 1:53 PM  Thomas [name]  has presented today for surgery, with the diagnosis of heart block  The various methods of treatment have been discussed with the patient and family. After consideration of risks, benefits and other options for treatment, the patient has consented to  Procedure(s): Pacemaker Implant (N/A) as a surgical intervention .  The patient's history has been reviewed, patient examined, no change in status, stable for surgery.  I have reviewed the patient's chart and labs.  Questions were answered to the patient's satisfaction.     Virl Axe  Questions answered

## 2016-06-19 NOTE — Progress Notes (Signed)
PHARMACIST - PHYSICIAN ORDER COMMUNICATION  CONCERNING: P&T Medication Policy on Herbal Medications  DESCRIPTION:  This patient's order for:  Co-enzyme Q 10, and garlic  has been noted.  This product(s) is classified as an "herbal" or natural product. Due to a lack of definitive safety studies or FDA approval, nonstandard manufacturing practices, plus the potential risk of unknown drug-drug interactions while on inpatient medications, the Pharmacy and Therapeutics Committee does not permit the use of "herbal" or natural products of this type within [facility].   ACTION TAKEN: The pharmacy department is unable to verify this order at this time and your patient has been informed of this safety policy. Please reevaluate patient's clinical condition at discharge and address if the herbal or natural product(s) should be resumed at that time.  Uvaldo Rising, BCPS  Clinical Pharmacist Pager [phone]  06/19/2016 5:37 PM

## 2016-06-20 ENCOUNTER — Ambulatory Visit (HOSPITAL_COMMUNITY): Payer: Medicare Other

## 2016-06-20 ENCOUNTER — Encounter (HOSPITAL_COMMUNITY): Payer: Self-pay | Admitting: Internal Medicine

## 2016-06-20 DIAGNOSIS — I481 Persistent atrial fibrillation: Secondary | ICD-10-CM | POA: Diagnosis not present

## 2016-06-20 DIAGNOSIS — I10 Essential (primary) hypertension: Secondary | ICD-10-CM | POA: Diagnosis not present

## 2016-06-20 DIAGNOSIS — I442 Atrioventricular block, complete: Secondary | ICD-10-CM

## 2016-06-20 DIAGNOSIS — I480 Paroxysmal atrial fibrillation: Secondary | ICD-10-CM | POA: Diagnosis not present

## 2016-06-20 MED ORDER — LISINOPRIL 10 MG PO TABS: 10.0000 mg | ORAL_TABLET | Freq: Every day | ORAL | 1 refills | Status: DC

## 2016-06-20 MED ORDER — OXYCODONE-ACETAMINOPHEN 5-325 MG PO TABS
1.0000 | ORAL_TABLET | Freq: Four times a day (QID) | ORAL | Status: DC | PRN
  Administered 2016-06-20 (×2): 1 via ORAL
  Filled 2016-06-20 (×2): qty 1

## 2016-06-20 NOTE — Discharge Instructions (Signed)
° ° °  Supplemental Discharge Instructions for  Pacemaker/Defibrillator Patients  Activity No heavy lifting or vigorous activity with your left/right arm for 6 to 8 weeks.  Do not raise your left/right arm above your head for one week.  Gradually raise your affected arm as drawn below.           __        06/24/16                        06/25/16                   06/26/16                06/27/16  NO DRIVING for 1 week    ; you may begin driving on  [id number]   .  WOUND CARE - Keep the wound area clean and dry. Remove pressure dressing on Sunday.  Ok to shower after pressure dressing removed.  - No bandage is needed on the site.  DO  NOT apply any creams, oils, or ointments to the wound area. - If you notice any drainage or discharge from the wound, any swelling or bruising at the site, or you develop a fever > 101? F after you are discharged home, call the office at once.  Special Instructions - You are still able to use cellular telephones; use the ear opposite the side where you have your pacemaker/defibrillator.  Avoid carrying your cellular phone near your device. - When traveling through airports, show security personnel your identification card to avoid being screened in the metal detectors.  Ask the security personnel to use the hand wand. - Avoid arc welding equipment, MRI testing (magnetic resonance imaging), TENS units (transcutaneous nerve stimulators).  Call the office for questions about other devices. - Avoid electrical appliances that are in poor condition or are not properly grounded. - Microwave ovens are safe to be near or to operate.

## 2016-06-20 NOTE — Care Management Note (Addendum)
Case Management Note  Patient Details  Name: BURCE TOYE MRN: [id number] Date of Birth: [birth year]  Subjective/Objective:   S/p pacemaker implant, for dc today.  Patient is already on eliquis, pta indep, he is in room with wife, he has no needs.                  Action/Plan:   Expected Discharge Date:  06/20/16               Expected Discharge Plan:  Home/Self Care  In-House Referral:     Discharge planning Services  CM Consult  Post Acute Care Choice:    Choice offered to:     DME Arranged:    DME Agency:     HH Arranged:    HH Agency:     Status of Service:  Completed, signed off  If discussed at [employer] of Stay Meetings, dates discussed:    Additional Comments:  Zenon Mayo, RN 06/20/2016, 9:05 AM

## 2016-06-25 ENCOUNTER — Other Ambulatory Visit (HOSPITAL_COMMUNITY): Payer: Medicare Other

## 2016-06-25 ENCOUNTER — Emergency Department: Payer: Medicare Other

## 2016-06-25 ENCOUNTER — Observation Stay (HOSPITAL_COMMUNITY)
Admission: AD | Admit: 2016-06-25 | Discharge: 2016-06-27 | Disposition: A | Discharge status: Home / Self Care | Payer: Medicare Other | Source: Other Acute Inpatient Hospital | Attending: Internal Medicine | Admitting: Internal Medicine

## 2016-06-25 ENCOUNTER — Emergency Department
Admission: EM | Admit: 2016-06-25 | Discharge: 2016-06-25 | Disposition: A | Discharge status: Other Acute Inpatient Hospital | Payer: Medicare Other | Attending: Emergency Medicine | Admitting: Emergency Medicine

## 2016-06-25 ENCOUNTER — Encounter (HOSPITAL_COMMUNITY): Payer: Self-pay | Admitting: General Practice

## 2016-06-25 ENCOUNTER — Encounter: Payer: Self-pay | Admitting: Emergency Medicine

## 2016-06-25 DIAGNOSIS — E785 Hyperlipidemia, unspecified: Secondary | ICD-10-CM | POA: Diagnosis not present

## 2016-06-25 DIAGNOSIS — N183 Chronic kidney disease, stage 3 (moderate): Secondary | ICD-10-CM | POA: Insufficient documentation

## 2016-06-25 DIAGNOSIS — G8194 Hemiplegia, unspecified affecting left nondominant side: Secondary | ICD-10-CM

## 2016-06-25 DIAGNOSIS — I10 Essential (primary) hypertension: Secondary | ICD-10-CM | POA: Diagnosis not present

## 2016-06-25 DIAGNOSIS — M1990 Unspecified osteoarthritis, unspecified site: Secondary | ICD-10-CM | POA: Insufficient documentation

## 2016-06-25 DIAGNOSIS — Z85528 Personal history of other malignant neoplasm of kidney: Secondary | ICD-10-CM | POA: Insufficient documentation

## 2016-06-25 DIAGNOSIS — M4802 Spinal stenosis, cervical region: Secondary | ICD-10-CM | POA: Insufficient documentation

## 2016-06-25 DIAGNOSIS — I442 Atrioventricular block, complete: Secondary | ICD-10-CM | POA: Diagnosis not present

## 2016-06-25 DIAGNOSIS — Z7902 Long term (current) use of antithrombotics/antiplatelets: Secondary | ICD-10-CM | POA: Insufficient documentation

## 2016-06-25 DIAGNOSIS — R0683 Snoring: Secondary | ICD-10-CM

## 2016-06-25 DIAGNOSIS — R531 Weakness: Secondary | ICD-10-CM | POA: Insufficient documentation

## 2016-06-25 DIAGNOSIS — G8190 Hemiplegia, unspecified affecting unspecified side: Secondary | ICD-10-CM

## 2016-06-25 DIAGNOSIS — Z7901 Long term (current) use of anticoagulants: Secondary | ICD-10-CM | POA: Insufficient documentation

## 2016-06-25 DIAGNOSIS — M549 Dorsalgia, unspecified: Secondary | ICD-10-CM | POA: Diagnosis not present

## 2016-06-25 DIAGNOSIS — Z8673 Personal history of transient ischemic attack (TIA), and cerebral infarction without residual deficits: Secondary | ICD-10-CM | POA: Insufficient documentation

## 2016-06-25 DIAGNOSIS — R2990 Unspecified symptoms and signs involving the nervous system: Secondary | ICD-10-CM

## 2016-06-25 DIAGNOSIS — G8929 Other chronic pain: Secondary | ICD-10-CM | POA: Insufficient documentation

## 2016-06-25 DIAGNOSIS — Z5181 Encounter for therapeutic drug level monitoring: Secondary | ICD-10-CM | POA: Insufficient documentation

## 2016-06-25 DIAGNOSIS — Z79899 Other long term (current) drug therapy: Secondary | ICD-10-CM | POA: Insufficient documentation

## 2016-06-25 DIAGNOSIS — K219 Gastro-esophageal reflux disease without esophagitis: Secondary | ICD-10-CM | POA: Diagnosis present

## 2016-06-25 DIAGNOSIS — Z8619 Personal history of other infectious and parasitic diseases: Secondary | ICD-10-CM | POA: Insufficient documentation

## 2016-06-25 DIAGNOSIS — G43909 Migraine, unspecified, not intractable, without status migrainosus: Secondary | ICD-10-CM | POA: Diagnosis not present

## 2016-06-25 DIAGNOSIS — I4892 Unspecified atrial flutter: Secondary | ICD-10-CM | POA: Diagnosis not present

## 2016-06-25 DIAGNOSIS — R200 Anesthesia of skin: Secondary | ICD-10-CM

## 2016-06-25 DIAGNOSIS — I129 Hypertensive chronic kidney disease with stage 1 through stage 4 chronic kidney disease, or unspecified chronic kidney disease: Secondary | ICD-10-CM | POA: Insufficient documentation

## 2016-06-25 DIAGNOSIS — M109 Gout, unspecified: Secondary | ICD-10-CM | POA: Diagnosis not present

## 2016-06-25 DIAGNOSIS — R4781 Slurred speech: Secondary | ICD-10-CM | POA: Diagnosis present

## 2016-06-25 DIAGNOSIS — Z880 Allergy status to penicillin: Secondary | ICD-10-CM | POA: Insufficient documentation

## 2016-06-25 DIAGNOSIS — I639 Cerebral infarction, unspecified: Secondary | ICD-10-CM | POA: Diagnosis not present

## 2016-06-25 DIAGNOSIS — I493 Ventricular premature depolarization: Secondary | ICD-10-CM | POA: Insufficient documentation

## 2016-06-25 DIAGNOSIS — Z91041 Radiographic dye allergy status: Secondary | ICD-10-CM | POA: Insufficient documentation

## 2016-06-25 DIAGNOSIS — E784 Other hyperlipidemia: Secondary | ICD-10-CM | POA: Diagnosis not present

## 2016-06-25 DIAGNOSIS — G459 Transient cerebral ischemic attack, unspecified: Secondary | ICD-10-CM

## 2016-06-25 DIAGNOSIS — Z85828 Personal history of other malignant neoplasm of skin: Secondary | ICD-10-CM | POA: Diagnosis not present

## 2016-06-25 DIAGNOSIS — K449 Diaphragmatic hernia without obstruction or gangrene: Secondary | ICD-10-CM | POA: Diagnosis not present

## 2016-06-25 DIAGNOSIS — M48061 Spinal stenosis, lumbar region without neurogenic claudication: Secondary | ICD-10-CM | POA: Diagnosis not present

## 2016-06-25 DIAGNOSIS — M4804 Spinal stenosis, thoracic region: Secondary | ICD-10-CM | POA: Diagnosis not present

## 2016-06-25 DIAGNOSIS — R471 Dysarthria and anarthria: Secondary | ICD-10-CM | POA: Diagnosis not present

## 2016-06-25 DIAGNOSIS — I6523 Occlusion and stenosis of bilateral carotid arteries: Secondary | ICD-10-CM | POA: Insufficient documentation

## 2016-06-25 DIAGNOSIS — Z950 Presence of cardiac pacemaker: Secondary | ICD-10-CM | POA: Diagnosis not present

## 2016-06-25 DIAGNOSIS — N179 Acute kidney failure, unspecified: Secondary | ICD-10-CM | POA: Insufficient documentation

## 2016-06-25 DIAGNOSIS — Z8261 Family history of arthritis: Secondary | ICD-10-CM | POA: Insufficient documentation

## 2016-06-25 DIAGNOSIS — Z8249 Family history of ischemic heart disease and other diseases of the circulatory system: Secondary | ICD-10-CM | POA: Insufficient documentation

## 2016-06-25 DIAGNOSIS — I739 Peripheral vascular disease, unspecified: Secondary | ICD-10-CM | POA: Insufficient documentation

## 2016-06-25 HISTORY — DX: Unspecified atrial flutter: I48.92

## 2016-06-25 LAB — COMPREHENSIVE METABOLIC PANEL
ALT: 26 U/L (ref 17–63)
AST: 35 U/L (ref 15–41)
Albumin: 4.1 g/dL (ref 3.5–5.0)
Alkaline Phosphatase: 83 U/L (ref 38–126)
Anion gap: 7 (ref 5–15)
BUN: 20 mg/dL (ref 6–20)
CO2: 26 mmol/L (ref 22–32)
Calcium: 9.5 mg/dL (ref 8.9–10.3)
Chloride: 101 mmol/L (ref 101–111)
Creatinine, Ser: 1.48 mg/dL — ABNORMAL HIGH (ref 0.61–1.24)
GFR calc Af Amer: 49 mL/min — ABNORMAL LOW (ref >60)
GFR calc non Af Amer: 43 mL/min — ABNORMAL LOW (ref >60)
Glucose, Bld: 102 mg/dL — ABNORMAL HIGH (ref 65–99)
Potassium: 4.5 mmol/L (ref 3.5–5.1)
Sodium: 134 mmol/L — ABNORMAL LOW (ref 135–145)
Total Bilirubin: 0.6 mg/dL (ref 0.3–1.2)
Total Protein: 7.1 g/dL (ref 6.5–8.1)

## 2016-06-25 LAB — CBC
HCT: 49.4 % (ref 40.0–52.0)
Hemoglobin: 16.2 g/dL (ref 13.0–18.0)
MCH: 29.1 pg (ref 26.0–34.0)
MCHC: 32.7 g/dL (ref 32.0–36.0)
MCV: 88.9 fL (ref 80.0–100.0)
Platelets: 145 10*3/uL — ABNORMAL LOW (ref 150–440)
RBC: 5.56 MIL/uL (ref 4.40–5.90)
RDW: 13.9 % (ref 11.5–14.5)
WBC: 8.8 10*3/uL (ref 3.8–10.6)

## 2016-06-25 LAB — DIFFERENTIAL
Basophils Absolute: 0.1 10*3/uL (ref 0–0.1)
Basophils Relative: 1 %
Eosinophils Absolute: 0.2 10*3/uL (ref 0–0.7)
Eosinophils Relative: 3 %
Lymphocytes Relative: 26 %
Lymphs Abs: 2.3 10*3/uL (ref 1.0–3.6)
Monocytes Absolute: 1.4 10*3/uL — ABNORMAL HIGH (ref 0.2–1.0)
Monocytes Relative: 16 %
Neutro Abs: 4.9 10*3/uL (ref 1.4–6.5)
Neutrophils Relative %: 54 %

## 2016-06-25 LAB — PROTIME-INR
INR: 1.06
Prothrombin Time: 13.8 seconds (ref 11.4–15.2)

## 2016-06-25 LAB — APTT: aPTT: 29 seconds (ref 24–36)

## 2016-06-25 LAB — GLUCOSE, CAPILLARY: Glucose-Capillary: 95 mg/dL (ref 65–99)

## 2016-06-25 LAB — TROPONIN I: Troponin I: <0.03 ng/mL (ref <0.03)

## 2016-06-25 MED ORDER — PANTOPRAZOLE SODIUM 40 MG PO TBEC
40.0000 mg | DELAYED_RELEASE_TABLET | Freq: Every day | ORAL | Status: DC
  Administered 2016-06-25 – 2016-06-27 (×3): 40 mg via ORAL
  Filled 2016-06-25 (×3): qty 1

## 2016-06-25 MED ORDER — HYDRALAZINE HCL 25 MG PO TABS
25.0000 mg | ORAL_TABLET | Freq: Three times a day (TID) | ORAL | Status: DC | PRN
  Filled 2016-06-25: qty 1

## 2016-06-25 MED ORDER — ATORVASTATIN CALCIUM 40 MG PO TABS
40.0000 mg | ORAL_TABLET | Freq: Every day | ORAL | Status: DC
  Administered 2016-06-25 – 2016-06-26 (×2): 40 mg via ORAL
  Filled 2016-06-25 (×2): qty 1

## 2016-06-25 MED ORDER — B-COMPLEX/B-12 PO TABS: 1.0000 | ORAL_TABLET | Freq: Every day | ORAL | Status: DC

## 2016-06-25 MED ORDER — FEBUXOSTAT 40 MG PO TABS
20.0000 mg | ORAL_TABLET | Freq: Every day | ORAL | Status: DC
  Administered 2016-06-26 – 2016-06-27 (×2): 20 mg via ORAL
  Filled 2016-06-25 (×2): qty 1

## 2016-06-25 MED ORDER — PROBIOTIC 250 MG PO CAPS: 1.0000 | ORAL_CAPSULE | ORAL | Status: DC

## 2016-06-25 MED ORDER — ARTIFICIAL TEARS OP OINT
1.0000 "application " | TOPICAL_OINTMENT | Freq: Every day | OPHTHALMIC | Status: DC
  Administered 2016-06-25 – 2016-06-26 (×2): 1 via OPHTHALMIC
  Filled 2016-06-25 (×2): qty 3.5

## 2016-06-25 MED ORDER — STROKE: EARLY STAGES OF RECOVERY BOOK
Freq: Once | Status: AC
  Administered 2016-06-25: 1
  Filled 2016-06-25: qty 1

## 2016-06-25 MED ORDER — PRESERVISION AREDS PO CAPS: 1.0000 | ORAL_CAPSULE | Freq: Every day | ORAL | Status: DC

## 2016-06-25 MED ORDER — LISINOPRIL 10 MG PO TABS
10.0000 mg | ORAL_TABLET | Freq: Every day | ORAL | Status: DC
  Administered 2016-06-26: 10 mg via ORAL
  Filled 2016-06-25: qty 1

## 2016-06-25 MED ORDER — APIXABAN 2.5 MG PO TABS
2.5000 mg | ORAL_TABLET | Freq: Two times a day (BID) | ORAL | Status: DC
  Administered 2016-06-25 – 2016-06-27 (×4): 2.5 mg via ORAL
  Filled 2016-06-25 (×4): qty 1

## 2016-06-25 MED ORDER — OMEGA-3-ACID ETHYL ESTERS 1 G PO CAPS
1.0000 g | ORAL_CAPSULE | Freq: Two times a day (BID) | ORAL | Status: DC
  Administered 2016-06-25 – 2016-06-27 (×4): 1 g via ORAL
  Filled 2016-06-25 (×4): qty 1

## 2016-06-25 MED ORDER — SODIUM CHLORIDE 0.9 % IV SOLN
INTRAVENOUS | Status: AC
  Administered 2016-06-25: 17:00:00 via INTRAVENOUS

## 2016-06-25 NOTE — ED Provider Notes (Signed)
[facility] Emergency Department Provider Note   ____________________________________________   I have reviewed the triage vital signs and the nursing notes.   HISTORY  Chief Complaint Cerebrovascular Accident   History limited by: Not Limited   HPI Thomas [name] is a 83 y.o. male who presents to the emergency department today with concerns for left arm numbness, weakness and left foot weakness. The patient states that the symptoms started roughly 1 hour prior to arrival. The patient feels like his left arm was getting numb and then one week. He also felt like he had a left foot drop. In addition he feels like he has a hard time getting his speech. Patient was in the hospital roughly 1 week ago for a TIA which involved left arm numbness. At that time he is also found to be in complete heart block and had a pacemaker placed. He is currently on a liquids. Patient denies any headaches although states he is worried he has a head bleed. Patient denies any chest pain or shortness of breath today.   Past Medical History:  Diagnosis Date  . Aortic valve disorders   . Arthritis   . Basal cell carcinoma    "face, nose left shoulder, left arm" (06/19/2016)  . BBB (bundle branch block)    hx right  . Chronic back pain    "neck, thoracic, lower back" (06/19/2016)  . Complete heart block ([place]) 06/2016  . GERD (gastroesophageal reflux disease)   . Heart block    "I've had type I, II Wen[place] before now" (06/19/2016)  . History of gout   . History of hiatal hernia    "self dx'd" (06/19/2016)  . Hyperlipidemia   . Hypertension   . Lyme disease    "dx'd by me 2003; cx's showed dx 08/2015"  . Migraine    "3-4/year" (06/19/2016)  . Presence of permanent cardiac pacemaker 06/19/2016  . PVC's (premature ventricular contractions)   . Renal cancer, left ([place]) 2006   S/P cryotherapy  . Spinal stenosis    "cervical, 1 thoracic, lumbar" (06/19/2016)  . Squamous carcinoma     "face, nose left shoulder, left arm" (06/19/2016)  . TIA (transient ischemic attack) 06/14/2016   "I'm not sure that's what it was" (06/19/2016)    Patient Active Problem List   Diagnosis Date Noted  . Complete heart block ([place])   . Pain in thoracic spine   . Orthostasis   . Lethargy   . Occlusion of vertebral artery   . TIA (transient ischemic attack) 06/14/2016  . Second degree AV block, Mobitz type I 02/28/2015  . Arthritis 02/06/2015  . Esophageal reflux 02/06/2015  . Arthritis urica 02/06/2015  . Cannot sleep 02/06/2015  . Arthritis, degenerative 02/06/2015  . Adenocarcinoma, renal cell ([place]) 02/06/2015  . Benign prostatic hyperplasia with urinary obstruction 11/21/2014  . Personal history of other malignant neoplasm of kidney 11/21/2014  . Palpitations 12/31/2013  . Hyperlipidemia 11/02/2010  . HYPERTENSION, BENIGN 04/16/2010    Past Surgical History:  Procedure Laterality Date  . ANKLE FRACTURE SURGERY Right 1967  . BASAL CELL CARCINOMA EXCISION     "face, nose left shoulder, left arm"  . BIOPSY PROSTATE  2001 & 2003  . CARDIAC CATHETERIZATION  1990's  . FRACTURE SURGERY    . INGUINAL HERNIA REPAIR Left 2012  . INSERT / REPLACE / REMOVE PACEMAKER  06/19/2016  . LAPAROSCOPIC ABLATION RENAL MASS    . PACEMAKER IMPLANT N/A 06/19/2016   Procedure: Pacemaker Implant;  Surgeon: Deboraha Sprang, MD;  Location: [facility];  Service: Cardiovascular;  Laterality: N/A;  . PROSTATE SURGERY    . SQUAMOUS CELL CARCINOMA EXCISION     "face, nose left shoulder, left arm"  . TONSILLECTOMY AND ADENOIDECTOMY      Prior to Admission medications   Medication Sig Start Date End Date Taking? Authorizing Provider  apixaban (ELIQUIS) 2.5 MG TABS tablet Take 1 tablet (2.5 mg total) by mouth 2 (two) times daily. 06/18/16   Deboraha Sprang, MD  Artemether-Lumefantrine (COARTEM PO) Take 4 tablets by mouth 2 (two) times daily.     Historical Provider, MD  atorvastatin (LIPITOR) 40 MG  tablet Take 1 tablet (40 mg total) by mouth daily at 6 PM. 06/15/16   Gladstone Lighter, MD  B Complex Vitamins (B-COMPLEX/B-12 PO) Take 1 tablet by mouth daily.    Historical Provider, MD  celecoxib (CELEBREX) 200 MG capsule Take 200 mg by mouth daily as needed for mild pain or moderate pain.    Historical Provider, MD  co-enzyme Q-10 30 MG capsule Take 30 mg by mouth 3 (three) times daily.    Historical Provider, MD  COLCRYS 0.6 MG tablet Take 0.6 mg by mouth as needed.  10/07/12   Historical Provider, MD  diclofenac sodium (VOLTAREN) 1 % GEL Place 2 g onto the skin daily as needed (PAIN).  01/20/14   Historical Provider, MD  doxycycline (VIBRA-TABS) 100 MG tablet Take 100 mg by mouth 2 (two) times daily.    Historical Provider, MD  esomeprazole (NEXIUM) 40 MG capsule Take 40 mg by mouth daily as needed.     Historical Provider, MD  febuxostat (ULORIC) 40 MG tablet Take 10 mg by mouth daily. TAKES 0.25 TABLET  DAILY    Historical Provider, MD  Garlic 10 MG CAPS Take 10 mg by mouth daily.     Historical Provider, MD  hydrALAZINE (APRESOLINE) 25 MG tablet Take 1 tablet (25 mg total) by mouth 3 (three) times daily as needed. Patient taking differently: Take 25 mg by mouth 3 (three) times daily as needed. ONLY FOR HIGH BP READINGS 09/14/15   Minna Merritts, MD  lisinopril (PRINIVIL,ZESTRIL) 10 MG tablet Take 1 tablet (10 mg total) by mouth daily. 06/20/16   Amber Sena Slate, NP  Magnesium Oxide 250 MG TABS Take 250 mg by mouth daily.    Historical Provider, MD  MULTIPLE VITAMIN PO Take 1 tablet by mouth daily.  07/24/12   Historical Provider, MD  Multiple Vitamins-Minerals (PRESERVISION AREDS PO) Take 1 tablet by mouth daily.    Historical Provider, MD  Multiple Vitamins-Minerals (ZINC PO) Take 1 tablet by mouth daily.    Historical Provider, MD  Omega-3 Fatty Acids (FISH OIL) 1000 MG CAPS Take 1,000 mg by mouth daily.     Historical Provider, MD  OVER THE COUNTER MEDICATION Take 1 tablet by mouth 2 (two)  times daily. Supplement for Immune Support for Lyme Disease DIM    Historical Provider, MD  OVER THE COUNTER MEDICATION Take 5 mLs by mouth 3 (three) times daily. Lauracidine Immune Support for Lyme Disease    Historical Provider, MD  OVER THE COUNTER MEDICATION Take 1 tablet by mouth daily. Betaglucin    Historical Provider, MD  Saccharomyces boulardii (PROBIOTIC) 250 MG CAPS Take 1 tablet by mouth See admin instructions. TAKES 3 HOURS AFTER DOXYCYCLINE    Historical Provider, MD  TURMERIC PO Take 1 tablet by mouth 2 (two) times daily.  Historical Provider, MD  vitamin E 400 UNIT capsule Take 400 Units by mouth daily.    Historical Provider, MD    Allergies Iodine and Penicillins  Family History  Problem Relation Age of Onset  . Heart attack Brother   . Aortic stenosis Mother   . Arthritis Father     Social History Social History  Substance Use Topics  . Smoking status: Never Smoker  . Smokeless tobacco: Never Used  . Alcohol use 1.2 oz/week    2 Glasses of wine per week    Review of Systems  Constitutional: Negative for fever. Cardiovascular: Negative for chest pain. Respiratory: Negative for shortness of breath. Gastrointestinal: Negative for abdominal pain, vomiting and diarrhea. Genitourinary: Negative for dysuria. Musculoskeletal: Negative for back pain. Skin: Negative for rash. Neurological: Positive for left arm numbness/weakness. Left foot drop. Difficulty with speech. 10-point ROS otherwise negative.  ____________________________________________   PHYSICAL EXAM:  VITAL SIGNS: ED Triage Vitals  Enc Vitals Group     BP 185/101     Pulse 71     Resp 18     Temp 97.7     Temp src      SpO2 97     Weight     Constitutional: Alert and oriented. Well appearing and in no distress. Eyes: Conjunctivae are normal. Normal extraocular movements. ENT   Head: Normocephalic and atraumatic.   Nose: No congestion/rhinnorhea.   Mouth/Throat: Mucous  membranes are moist.   Neck: No stridor. Hematological/Lymphatic/Immunilogical: No cervical lymphadenopathy. Cardiovascular: Normal rate, regular rhythm.  No murmurs, rubs, or gallops.  Respiratory: Normal respiratory effort without tachypnea nor retractions. Breath sounds are clear and equal bilaterally. No wheezes/rales/rhonchi. Genitourinary: Deferred Musculoskeletal: Normal range of motion in all extremities. No lower extremity edema. Neurologic:  Normal speech and language. No slurred speech appreciated. PERRL. EOMI. Tongue midline. Face symmetric. Slight pronator drift of the left upper extremity. Slight drift of left lower extremity however able to keep it above the bed. Skin:  Skin is warm, dry and intact. No rash noted. Psychiatric: Mood and affect are normal. Speech and behavior are normal. Patient exhibits appropriate insight and judgment.  ____________________________________________    LABS (pertinent positives/negatives)  Labs Reviewed  CBC - Abnormal; Notable for the following:       Result Value   Platelets 145 (*)    All other components within normal limits  DIFFERENTIAL - Abnormal; Notable for the following:    Monocytes Absolute 1.4 (*)    All other components within normal limits  COMPREHENSIVE METABOLIC PANEL - Abnormal; Notable for the following:    Sodium 134 (*)    Glucose, Bld 102 (*)    Creatinine, Ser 1.48 (*)    GFR calc non Af Amer 43 (*)    GFR calc Af Amer 49 (*)    All other components within normal limits  PROTIME-INR  APTT  TROPONIN I  GLUCOSE, CAPILLARY  CBG MONITORING, ED     ____________________________________________   EKG  I, Nance Pear, attending physician, personally viewed and interpreted this EKG  EKG Time: 1157 Rate: 74 Rhythm: atrial sensed ventricular paced rhythm Axis: left axis deviation Intervals: qtc 608 QRS: wide ST changes: no st elevation Impression: abnormal  ekg   ____________________________________________    RADIOLOGY  CT head IMPRESSION:  1. Atrophy and small vessel disease similar to prior MR. No acute  intracranial findings.  2. ASPECTS is 10.   I, Campbell Kray, personally discussed these images and results by phone  with the on-call radiologist and used this discussion as part of my medical decision making.   ____________________________________________   PROCEDURES  Procedures  CRITICAL CARE Performed by: Nance Pear   Total critical care time: 35 minutes  Critical care time was exclusive of separately billable procedures and treating other patients.  Critical care was necessary to treat or prevent imminent or life-threatening deterioration.  Critical care was time spent personally by me on the following activities: development of treatment plan with patient and/or surrogate as well as nursing, discussions with consultants, evaluation of patient's response to treatment, examination of patient, obtaining history from patient or surrogate, ordering and performing treatments and interventions, ordering and review of laboratory studies, ordering and review of radiographic studies, pulse oximetry and re-evaluation of patient's condition.  ____________________________________________   INITIAL IMPRESSION / ASSESSMENT AND PLAN / ED COURSE  Pertinent labs & imaging results that were available during my care of the patient were reviewed by me and considered in my medical decision making (see chart for details).  Patient presented to the emergency department today because of concerns for left-sided weakness and numbness. Patient was called a code stroke. CT head did not show any acute bleed or obvious infarct. Given the patient was called a code stroke Dr. Doy Mince with neurology did evaluate the patient. At this point patient is not a TPA candidate given that he is on eliquis. Unfortunately the patient recently had a  pacemaker placed thus making MRI difficult to obtain. It is apparently an MRI compatible pacemaker however we do not have the ability to do that here [place]. Per Dr. Doy Mince recommendations patient was arranged to be transferred to [facility] where he could undergo MR. Additionally if a large vessel occlusion was found he would be able to have intervention.  ____________________________________________   FINAL CLINICAL IMPRESSION(S) / ED DIAGNOSES  Final diagnoses:  Left-sided weakness     Note: This dictation was prepared with Dragon dictation. Any transcriptional errors that result from this process are unintentional     Nance Pear, MD 06/25/16 1418

## 2016-06-25 NOTE — Care Management Obs Status (Signed)
MEDICARE OBSERVATION STATUS NOTIFICATION   Patient Details  Name: Thomas [name] MRN: [id number] Date of Birth: 1932-12-08   Medicare Observation Status Notification Given:  Yes    Sharin Mons, RN 06/25/2016, 3:30 PM

## 2016-06-25 NOTE — ED Triage Notes (Signed)
Left arm weakness and left leg weakness starting 1 hr prior to arrival. Hx recent TIA/pacemaker for complete heart block. Numbness in left arm present since TIA but worse today. Feels like mouth stiff.

## 2016-06-25 NOTE — ED Notes (Signed)
Left with [facility]. Stable and improved on transfer.

## 2016-06-25 NOTE — ED Notes (Signed)
Neurology at bedside.

## 2016-06-25 NOTE — H&P (Signed)
History and Physical    Thomas [name] [id number] DOB: 1932/12/06 DOA: 06/25/2016  PCP: [name], MD Patient coming from: [facility]  Chief Complaint: Left sided weakness and numbness, slurred speech   HPI: Thomas [name] is a 83 y.o. male with medical history significant of recent diagnosis of TIA (06/14/2016), atrial flutter - on Eliquis, symptomatic third-degree AV block status post permanent pacemaker implantation (06/19/2016), hypertension, hyperlipidemia, GERD, gout, Lyme disease who presented to the [facility] with c/o left sided numbness weakness and slurred speech. The symptoms of left facial and left upper extremity and numbness have been intermittently present since the first diagnosis of TIA in the beginning of this month. This morning, around 3 o'clock. patient again developed the same but it spread to the left lower extremity and in addition to numbness patient developed left sided weakness. Currently patient has mild numbness of the medial side of the hand and 5 th finger, otherwise symptoms resolved  ED Course: on arrival to the ED of the [facility] with left sided hemiparesis and slurred speech. On presentation he had elevated blood pressure 155/101 mmHg, blood work showed  renal insufficiency with creatinine of 1.48, which is near his baseline and GFR is 49. He was seen by neurology in consultation. Initial NIHSS was 3.  Due to his contrast allergy and unavailability of MRI compatible with the pacemeaker, patient was transferred to [facility]  Review of Systems: As per HPI otherwise 10 point review of systems negative.   Ambulatory Status: Independent  Past Medical History:  Diagnosis Date  . Aortic valve disorders   . Arthritis   . Basal cell carcinoma    "face, nose left shoulder, left arm" (06/19/2016)  . BBB (bundle branch block)    hx right  . Chronic back pain    "neck, thoracic, lower back" (06/19/2016)  . Complete  heart block ([place]) 06/2016  . GERD (gastroesophageal reflux disease)   . Heart block    "I've had type I, II Wenke before now" (06/19/2016)  . History of gout   . History of hiatal hernia    "self dx'd" (06/19/2016)  . Hyperlipidemia   . Hypertension   . Lyme disease    "dx'd by me 2003; cx's showed dx 08/2015"  . Migraine    "3-4/year" (06/19/2016)  . Presence of permanent cardiac pacemaker 06/19/2016  . PVC's (premature ventricular contractions)   . Renal cancer, left ([place]) 2006   S/P cryotherapy  . Spinal stenosis    "cervical, 1 thoracic, lumbar" (06/19/2016)  . Squamous carcinoma    "face, nose left shoulder, left arm" (06/19/2016)  . TIA (transient ischemic attack) 06/14/2016   "I'm not sure that's what it was" (06/19/2016)    Past Surgical History:  Procedure Laterality Date  . ANKLE FRACTURE SURGERY Right 1967  . BASAL CELL CARCINOMA EXCISION     "face, nose left shoulder, left arm"  . BIOPSY PROSTATE  2001 & 2003  . CARDIAC CATHETERIZATION  1990's  . FRACTURE SURGERY    . INGUINAL HERNIA REPAIR Left 2012  . INSERT / REPLACE / REMOVE PACEMAKER  06/19/2016  . LAPAROSCOPIC ABLATION RENAL MASS    . PACEMAKER IMPLANT N/A 06/19/2016   Procedure: Pacemaker Implant;  Surgeon: Deboraha Sprang, MD;  Location: [facility];  Service: Cardiovascular;  Laterality: N/A;  . PROSTATE SURGERY    . SQUAMOUS CELL CARCINOMA EXCISION     "face, nose left shoulder, left arm"  .  TONSILLECTOMY AND ADENOIDECTOMY      Social History   Social History  . Marital status: Married    Spouse name: N/A  . Number of children: N/A  . Years of education: N/A   Occupational History  . Not on file.   Social History Main Topics  . Smoking status: Never Smoker  . Smokeless tobacco: Never Used  . Alcohol use 1.2 oz/week    2 Glasses of wine per week  . Drug use: No  . Sexual activity: Yes   Other Topics Concern  . Not on file   Social History Narrative   Independent at baseline. Lives  with family    Allergies  Allergen Reactions  . Iodine Hives    IVP contrast  . Penicillins Itching    ITCHY FEELING IN FINGERS Has patient had a PCN reaction causing immediate rash, facial/tongue/throat swelling, SOB or lightheadedness with hypotension: No Has patient had a PCN reaction causing severe rash involving mucus membranes or skin necrosis: No Has patient had a PCN reaction that required hospitalization No Has patient had a PCN reaction occurring within the last 10 years: No If all of the above answers are "NO", then may proceed with Cephalosporin use.     Family History  Problem Relation Age of Onset  . Heart attack Brother   . Aortic stenosis Mother   . Arthritis Father     Prior to Admission medications   Medication Sig Start Date End Date Taking? Authorizing Provider  apixaban (ELIQUIS) 2.5 MG TABS tablet Take 1 tablet (2.5 mg total) by mouth 2 (two) times daily. 06/18/16   Deboraha Sprang, MD  Artemether-Lumefantrine (COARTEM PO) Take 4 tablets by mouth 2 (two) times daily.     Historical Provider, MD  atorvastatin (LIPITOR) 40 MG tablet Take 1 tablet (40 mg total) by mouth daily at 6 PM. 06/15/16   Gladstone Lighter, MD  B Complex Vitamins (B-COMPLEX/B-12 PO) Take 1 tablet by mouth daily.    Historical Provider, MD  celecoxib (CELEBREX) 200 MG capsule Take 200 mg by mouth daily as needed for mild pain or moderate pain.    Historical Provider, MD  co-enzyme Q-10 30 MG capsule Take 30 mg by mouth 3 (three) times daily.    Historical Provider, MD  COLCRYS 0.6 MG tablet Take 0.6 mg by mouth as needed.  10/07/12   Historical Provider, MD  diclofenac sodium (VOLTAREN) 1 % GEL Place 2 g onto the skin daily as needed (PAIN).  01/20/14   Historical Provider, MD  doxycycline (VIBRA-TABS) 100 MG tablet Take 100 mg by mouth 2 (two) times daily.    Historical Provider, MD  esomeprazole (NEXIUM) 40 MG capsule Take 40 mg by mouth daily as needed.     Historical Provider, MD  febuxostat  (ULORIC) 40 MG tablet Take 10 mg by mouth daily. TAKES 0.25 TABLET  DAILY    Historical Provider, MD  Garlic 10 MG CAPS Take 10 mg by mouth daily.     Historical Provider, MD  hydrALAZINE (APRESOLINE) 25 MG tablet Take 1 tablet (25 mg total) by mouth 3 (three) times daily as needed. Patient taking differently: Take 25 mg by mouth 3 (three) times daily as needed. ONLY FOR HIGH BP READINGS 09/14/15   Minna Merritts, MD  lisinopril (PRINIVIL,ZESTRIL) 10 MG tablet Take 1 tablet (10 mg total) by mouth daily. 06/20/16   Amber Sena Slate, NP  Magnesium Oxide 250 MG TABS Take 250 mg by mouth daily.  Historical Provider, MD  MULTIPLE VITAMIN PO Take 1 tablet by mouth daily.  07/24/12   Historical Provider, MD  Multiple Vitamins-Minerals (PRESERVISION AREDS PO) Take 1 tablet by mouth daily.    Historical Provider, MD  Multiple Vitamins-Minerals (ZINC PO) Take 1 tablet by mouth daily.    Historical Provider, MD  Omega-3 Fatty Acids (FISH OIL) 1000 MG CAPS Take 1,000 mg by mouth daily.     Historical Provider, MD  OVER THE COUNTER MEDICATION Take 1 tablet by mouth 2 (two) times daily. Supplement for Immune Support for Lyme Disease DIM    Historical Provider, MD  OVER THE COUNTER MEDICATION Take 5 mLs by mouth 3 (three) times daily. Lauracidine Immune Support for Lyme Disease    Historical Provider, MD  OVER THE COUNTER MEDICATION Take 1 tablet by mouth daily. Betaglucin    Historical Provider, MD  Saccharomyces boulardii (PROBIOTIC) 250 MG CAPS Take 1 tablet by mouth See admin instructions. TAKES 3 HOURS AFTER DOXYCYCLINE    Historical Provider, MD  TESTOSTERONE CYPIONATE IM Inject 1 mL into the muscle every 14 (fourteen) days.    Historical Provider, MD  TURMERIC PO Take 1 tablet by mouth 2 (two) times daily.    Historical Provider, MD  vitamin E 400 UNIT capsule Take 400 Units by mouth daily.    Historical Provider, MD    Physical Exam: Vitals:   06/25/16 1438  Weight: 78 kg (172 lb)  Height: 5\' 7"   (1.702 m)     General: Appears calm and comfortable Eyes: PERRLA, EOMI, normal lids, iris ENT:  grossly normal hearing, lips & tongue, mucous membranes moist and intact Neck: no lymphoadenopathy, masses or thyromegaly Cardiovascular: RRR, no m/r/g. No JVD, carotid bruits. No LE edema.  Respiratory: bilateral no wheezes, rales, rhonchi or cracles. Normal respiratory effort. No accessory muscle use observed Abdomen: soft, non-tender, non-distended, no organomegaly or masses appreciated. BS present in all quadrants Skin: no rash, ulcers or induration seen on limited exam Musculoskeletal: grossly normal tone BUE/BLE, good ROM, no bony abnormality or joint deformities observed Psychiatric: grossly normal mood and affect, speech fluent and appropriate, alert and oriented x3 Neurologic: CN II-XII grossly intact, moves all extremities in coordinated fashion, sensation intact  Labs on Admission: I have personally reviewed following labs and imaging studies  CBC, BMP  GFR: Estimated Creatinine Clearance: 36.6 mL/min (by C-G formula based on SCr of 1.48 mg/dL (H)).   Creatinine Clearance: Estimated Creatinine Clearance: 36.6 mL/min (by C-G formula based on SCr of 1.48 mg/dL (H)).    Radiological Exams on Admission: Ct Head Code Stroke W/o Cm  Result Date: 06/25/2016 CLINICAL DATA:  Code stroke.  LEFT-sided weakness.  Pacemaker. EXAM: CT HEAD WITHOUT CONTRAST TECHNIQUE: Contiguous axial images were obtained from the base of the skull through the vertex without intravenous contrast. COMPARISON:  MR head 06/15/2016.  CT head 06/14/2016. FINDINGS: Brain: No evidence of acute infarction, hemorrhage, hydrocephalus, extra-axial collection or mass lesion/mass effect. Normal for age cerebral volume. Hypoattenuation of white matter of a mild nature favored to represent chronic microvascular ischemic change. Vascular: No hyperdense vessel or unexpected calcification. Atheromatous change in the cavernous  carotid arteries, stable. Skull: Normal. Negative for fracture or focal lesion. Sinuses/Orbits: No acute finding. Other: None. ASPECTS [facility] Stroke Program Early CT Score) - Ganglionic level infarction (caudate, lentiform nuclei, internal capsule, insula, M1-M3 cortex): 7 - Supraganglionic infarction (M4-M6 cortex): 3 Total score (0-10 with 10 being normal): 10 Compared with prior imaging, no significant change. IMPRESSION: 1.  Atrophy and small vessel disease similar to prior MR. No acute intracranial findings. 2. ASPECTS is 10. These results were called by telephone at the time of interpretation on 06/25/2016 at 11:56 am to Dr. Nance Pear , who verbally acknowledged these results. Electronically Signed   By: Staci Righter M.D.   On: 06/25/2016 11:57    EKG: Independently reviewed - paced rhythm  Assessment/Plan Principal Problem:   Hemiparesis (HCC) Active Problems:   HYPERTENSION, BENIGN   Hyperlipidemia   Esophageal reflux   TIA (transient ischemic attack)   Complete heart block (HCC)   Cardiac pacemaker in situ    Possible stroke  in patient with known history of recent TIA - presented with hemiparesis and slurred speech Neurology seen patient in the outside hospital and our neurology team will continue tofollow while hospitalized MRI/MRA without contrast ordered Patient underwent a recent stroke work up with carotid US and TTE Most recent TTE was on 06/15/2016 - EF 55-60%, normal wall motion Carotid UA did not reveal significant strenosis Hgb A1C 5.5% Continue Eliquis for prophylaxis MRI department contacted [employer] tech to be present during MRI which will be performed most likely tomorrow  HTN  At this point will hold oral home BP meds and follow neurology recommendation  Hyperlipidemia Last lipid profile on 06/14/2016 showed normal tchol, HDL and TGL, slightly above goal LDL - 75 Continue statin therapy with Lipitor  Complete heart block - s/p recent PTV PM  implantation Continue to observe for rhythm disturbance and pacer malfunctioning  GERD Continue PPI and monitor symptoms  CKD - avoid nephrotoxic agents, monitor renal function  History of Lyme disease Patient has been treated with antibiotics   DVT prophylaxis: Eliquis Code Status: full  Family Communication: none Disposition Plan: telemetry Consults called: Neurology Admission status: observation   York Grice, [place] Pager: [phone] [facility]  If 7PM-7AM, please contact night-coverage [email or web address] Password [facility]  06/25/2016, 3:42 PM

## 2016-06-25 NOTE — ED Notes (Signed)
Pt started with left arm weakness and left leg weakness today. Has had numbness to left arm since dx with TIA. This seemed worse today as well.

## 2016-06-25 NOTE — Progress Notes (Signed)
[place] responded to a page for Code Stroke and visited the Pt in ED Rm14. When the Ch arrived, the Pt had been taken to do a CT Scan, but the Pt's wife was present. CH was about to introduced himself when the Pt's wife tearful told [place] that she was not ready to talk to the [place]. [place] thanked the Pt's wife and plans to make a follow-up visit with the Pt later. Pt's wife appeared be overwhelmed by what was going on and needed sometime for her to process what had happened to her husband.    06/25/16 1200  Clinical Encounter Type  Visited With Patient;Family  Visit Type Initial;Spiritual support;Code;Trauma  Referral From Nurse  Consult/Referral To Chaplain  Spiritual Encounters  Spiritual Needs Prayer;Other (Comment)

## 2016-06-25 NOTE — Consult Note (Signed)
Referring Physician: Archie Balboa    Chief Complaint: Left sided numbness and weakness  HPI: Thomas [name] is an 83 y.o. male with a history of TIA (06/14/2016) and complete heart block s/p pacemaker placement who reports that he has been having some intermittent symptoms of left facial and LUE numbness since his initial presentation.  Reports that his morning his symptoms recurred abut also involved LUE and LLE weakness.  Patient presented for evaluation at that time.  Initial NIHSS of 3.  .    Date last known well: Date: 06/25/2016 Time last known well: Time: 10:58 tPA Given: No: On Eliquis  Past Medical History:  Diagnosis Date  . Aortic valve disorders   . Arthritis   . Basal cell carcinoma    "face, nose left shoulder, left arm" (06/19/2016)  . BBB (bundle branch block)    hx right  . Chronic back pain    "neck, thoracic, lower back" (06/19/2016)  . Complete heart block ([place]) 06/2016  . GERD (gastroesophageal reflux disease)   . Heart block    "I've had type I, II Wenke before now" (06/19/2016)  . History of gout   . History of hiatal hernia    "self dx'd" (06/19/2016)  . Hyperlipidemia   . Hypertension   . Lyme disease    "dx'd by me 2003; cx's showed dx 08/2015"  . Migraine    "3-4/year" (06/19/2016)  . Presence of permanent cardiac pacemaker 06/19/2016  . PVC's (premature ventricular contractions)   . Renal cancer, left ([place]) 2006   S/P cryotherapy  . Spinal stenosis    "cervical, 1 thoracic, lumbar" (06/19/2016)  . Squamous carcinoma    "face, nose left shoulder, left arm" (06/19/2016)  . TIA (transient ischemic attack) 06/14/2016   "I'm not sure that's what it was" (06/19/2016)    Past Surgical History:  Procedure Laterality Date  . ANKLE FRACTURE SURGERY Right 1967  . BASAL CELL CARCINOMA EXCISION     "face, nose left shoulder, left arm"  . BIOPSY PROSTATE  2001 & 2003  . CARDIAC CATHETERIZATION  1990's  . FRACTURE SURGERY    . INGUINAL HERNIA REPAIR Left 2012  .  INSERT / REPLACE / REMOVE PACEMAKER  06/19/2016  . LAPAROSCOPIC ABLATION RENAL MASS    . PACEMAKER IMPLANT N/A 06/19/2016   Procedure: Pacemaker Implant;  Surgeon: Deboraha Sprang, MD;  Location: [facility];  Service: Cardiovascular;  Laterality: N/A;  . PROSTATE SURGERY    . SQUAMOUS CELL CARCINOMA EXCISION     "face, nose left shoulder, left arm"  . TONSILLECTOMY AND ADENOIDECTOMY      Family History  Problem Relation Age of Onset  . Heart attack Brother   . Aortic stenosis Mother   . Arthritis Father    Social History:  reports that he has never smoked. He has never used smokeless tobacco. He reports that he drinks about 1.2 oz of alcohol per week . He reports that he does not use drugs.  Allergies:  Allergies  Allergen Reactions  . Iodine Hives    IVP contrast  . Penicillins Itching    ITCHY FEELING IN FINGERS Has patient had a PCN reaction causing immediate rash, facial/tongue/throat swelling, SOB or lightheadedness with hypotension: No Has patient had a PCN reaction causing severe rash involving mucus membranes or skin necrosis: No Has patient had a PCN reaction that required hospitalization No Has patient had a PCN reaction occurring within the last 10 years: No If all of the  above answers are "NO", then may proceed with Cephalosporin use.     Medications: I have reviewed the patient's current medications. Prior to Admission:  Prior to Admission medications   Medication Sig Start Date End Date Taking? Authorizing Provider  apixaban (ELIQUIS) 2.5 MG TABS tablet Take 1 tablet (2.5 mg total) by mouth 2 (two) times daily. 06/18/16  Yes Deboraha Sprang, MD  Artemether-Lumefantrine (COARTEM PO) Take 4 tablets by mouth 2 (two) times daily.    Yes Historical Provider, MD  atorvastatin (LIPITOR) 40 MG tablet Take 1 tablet (40 mg total) by mouth daily at 6 PM. 06/15/16  Yes Gladstone Lighter, MD  doxycycline (VIBRA-TABS) 100 MG tablet Take 100 mg by mouth 2 (two) times daily.    Yes Historical Provider, MD  lisinopril (PRINIVIL,ZESTRIL) 10 MG tablet Take 1 tablet (10 mg total) by mouth daily. 06/20/16  Yes Amber Sena Slate, NP  TESTOSTERONE CYPIONATE IM Inject 1 mL into the muscle every 14 (fourteen) days.   Yes Historical Provider, MD  B Complex Vitamins (B-COMPLEX/B-12 PO) Take 1 tablet by mouth daily.    Historical Provider, MD  celecoxib (CELEBREX) 200 MG capsule Take 200 mg by mouth daily as needed for mild pain or moderate pain.    Historical Provider, MD  co-enzyme Q-10 30 MG capsule Take 30 mg by mouth 3 (three) times daily.    Historical Provider, MD  COLCRYS 0.6 MG tablet Take 0.6 mg by mouth as needed.  10/07/12   Historical Provider, MD  diclofenac sodium (VOLTAREN) 1 % GEL Place 2 g onto the skin daily as needed (PAIN).  01/20/14   Historical Provider, MD  esomeprazole (NEXIUM) 40 MG capsule Take 40 mg by mouth daily as needed.     Historical Provider, MD  febuxostat (ULORIC) 40 MG tablet Take 10 mg by mouth daily. TAKES 0.25 TABLET  DAILY    Historical Provider, MD  Garlic 10 MG CAPS Take 10 mg by mouth daily.     Historical Provider, MD  hydrALAZINE (APRESOLINE) 25 MG tablet Take 1 tablet (25 mg total) by mouth 3 (three) times daily as needed. Patient taking differently: Take 25 mg by mouth 3 (three) times daily as needed. ONLY FOR HIGH BP READINGS 09/14/15   Minna Merritts, MD  Magnesium Oxide 250 MG TABS Take 250 mg by mouth daily.    Historical Provider, MD  MULTIPLE VITAMIN PO Take 1 tablet by mouth daily.  07/24/12   Historical Provider, MD  Multiple Vitamins-Minerals (PRESERVISION AREDS PO) Take 1 tablet by mouth daily.    Historical Provider, MD  Multiple Vitamins-Minerals (ZINC PO) Take 1 tablet by mouth daily.    Historical Provider, MD  Omega-3 Fatty Acids (FISH OIL) 1000 MG CAPS Take 1,000 mg by mouth daily.     Historical Provider, MD  OVER THE COUNTER MEDICATION Take 1 tablet by mouth 2 (two) times daily. Supplement for Immune Support for Lyme  Disease DIM    Historical Provider, MD  OVER THE COUNTER MEDICATION Take 5 mLs by mouth 3 (three) times daily. Lauracidine Immune Support for Lyme Disease    Historical Provider, MD  OVER THE COUNTER MEDICATION Take 1 tablet by mouth daily. Betaglucin    Historical Provider, MD  Saccharomyces boulardii (PROBIOTIC) 250 MG CAPS Take 1 tablet by mouth See admin instructions. TAKES 3 HOURS AFTER DOXYCYCLINE    Historical Provider, MD  TURMERIC PO Take 1 tablet by mouth 2 (two) times daily.    Historical Provider, MD  vitamin E 400 UNIT capsule Take 400 Units by mouth daily.    Historical Provider, MD     ROS: History obtained from the patient  General ROS: negative for - chills, fatigue, fever, night sweats, weight gain or weight loss Psychological ROS: negative for - behavioral disorder, hallucinations, memory difficulties, mood swings or suicidal ideation Ophthalmic ROS: negative for - blurry vision, double vision, eye pain or loss of vision ENT ROS: negative for - epistaxis, nasal discharge, oral lesions, sore throat, tinnitus or vertigo Allergy and Immunology ROS: negative for - hives or itchy/watery eyes Hematological and Lymphatic ROS: negative for - bleeding problems, bruising or swollen lymph nodes Endocrine ROS: negative for - galactorrhea, hair pattern changes, polydipsia/polyuria or temperature intolerance Respiratory ROS: negative for - cough, hemoptysis, shortness of breath or wheezing Cardiovascular ROS: negative for - chest pain, dyspnea on exertion, edema or irregular heartbeat Gastrointestinal ROS: negative for - abdominal pain, diarrhea, hematemesis, nausea/vomiting or stool incontinence Genito-Urinary ROS: negative for - dysuria, hematuria, incontinence or urinary frequency/urgency Musculoskeletal ROS: negative for - joint swelling or muscular weakness Neurological ROS: as noted in HPI Dermatological ROS: negative for rash and skin lesion changes  Physical  Examination: Blood pressure (!) 185/101, pulse 71, resp. rate 18, height 5\' 7"  (1.702 m), SpO2 97 %.  HEENT-  Normocephalic, no lesions, without obvious abnormality.  Normal external eye and conjunctiva.  Normal TM's bilaterally.  Normal auditory canals and external ears. Normal external nose, mucus membranes and septum.  Normal pharynx. Cardiovascular- S1, S2 normal, pulses palpable throughout   Lungs- chest clear, no wheezing, rales, normal symmetric air entry Abdomen- soft, non-tender; bowel sounds normal; no masses,  no organomegaly Extremities- no edema Lymph-no adenopathy palpable Musculoskeletal-no joint tenderness, deformity or swelling Skin-warm and dry, no hyperpigmentation, vitiligo, or suspicious lesions  Neurological Examination   Mental Status: Alert, oriented, thought content appropriate.  Speech fluent without evidence of aphasia with some mild slurring  Able to follow 3 step commands without difficulty. Cranial Nerves: II: Discs flat bilaterally; Visual fields grossly normal, pupils equal, round, reactive to light and accommodation III,IV, VI: ptosis not present, extra-ocular motions intact bilaterally V,VII: smile symmetric, facial light touch sensation decreased on the left VIII: hearing normal bilaterally IX,X: gag reflex present XI: bilateral shoulder shrug XII: midline tongue extension Motor: Right : Upper extremity   5/5    Left:     Upper extremity   5/5  Lower extremity   5/5     Lower extremity   5-/5 Tone and bulk:normal tone throughout; no atrophy noted Sensory: Pinprick and light touch decreased on the left Deep Tendon Reflexes: 2+ and symmetric throughout Plantars: Right: mute   Left: mute Cerebellar: Dysmetria with LUE finger-to-nose and normal heel-to-shin testing bilaterally Gait: not tested due to safety concerns    Laboratory Studies:  Basic Metabolic Panel:  Recent Labs Lab 06/25/16 1143  NA 134*  K 4.5  CL 101  CO2 26  GLUCOSE 102*   BUN 20  CREATININE 1.48*  CALCIUM 9.5    Liver Function Tests:  Recent Labs Lab 06/25/16 1143  AST 35  ALT 26  ALKPHOS 83  BILITOT 0.6  PROT 7.1  ALBUMIN 4.1   No results for input(s): LIPASE, AMYLASE in the last 168 hours. No results for input(s): AMMONIA in the last 168 hours.  CBC:  Recent Labs Lab 06/25/16 1143  WBC 8.8  NEUTROABS 4.9  HGB 16.2  HCT 49.4  MCV 88.9  PLT 145*  Cardiac Enzymes:  Recent Labs Lab 06/25/16 1143  TROPONINI <0.03    BNP: Invalid input(s): POCBNP  CBG:  Recent Labs Lab 06/25/16 1159  [place]    Microbiology: Results for orders placed or performed during the hospital encounter of 06/19/16  Surgical pcr screen     Status: None   Collection Time: 06/19/16  1:37 PM  Result Value Ref Range Status   MRSA, PCR NEGATIVE NEGATIVE Final   Staphylococcus aureus NEGATIVE NEGATIVE Final    Comment:        The Xpert SA Assay (FDA approved for NASAL specimens in patients over 19 years of age), is one component of a comprehensive surveillance program.  Test performance has been validated by [facility] for patients greater than or equal to 39 year old. It is not intended to diagnose infection nor to guide or monitor treatment.     Coagulation Studies:  Recent Labs  06/25/16 1143  LABPROT 13.8  INR 1.06    Urinalysis: No results for input(s): COLORURINE, LABSPEC, PHURINE, GLUCOSEU, HGBUR, BILIRUBINUR, KETONESUR, PROTEINUR, UROBILINOGEN, NITRITE, LEUKOCYTESUR in the last 168 hours.  Invalid input(s): APPERANCEUR  Lipid Panel:    Component Value Date/Time   CHOL 142 06/14/2016 1346   CHOL 144 06/26/2012 1106   TRIG 38 06/14/2016 1346   TRIG 46 06/26/2012 1106   HDL 59 06/14/2016 1346   HDL 61 (H) 06/26/2012 1106   CHOLHDL 2.4 06/14/2016 1346   VLDL 8 06/14/2016 1346   VLDL 9 06/26/2012 1106   LDLCALC 75 06/14/2016 1346   LDLCALC 74 06/26/2012 1106    HgbA1C:  Lab Results  Component Value Date    HGBA1C 5.5 06/14/2016    Urine Drug Screen:  No results found for: LABOPIA, COCAINSCRNUR, LABBENZ, AMPHETMU, THCU, LABBARB  Alcohol Level: No results for input(s): ETH in the last 168 hours.  Other results: EKG: paced rhythm at 74 bpm.  Imaging: Ct Head Code Stroke W/o Cm  Result Date: 06/25/2016 CLINICAL DATA:  Code stroke.  LEFT-sided weakness.  Pacemaker. EXAM: CT HEAD WITHOUT CONTRAST TECHNIQUE: Contiguous axial images were obtained from the base of the skull through the vertex without intravenous contrast. COMPARISON:  MR head 06/15/2016.  CT head 06/14/2016. FINDINGS: Brain: No evidence of acute infarction, hemorrhage, hydrocephalus, extra-axial collection or mass lesion/mass effect. Normal for age cerebral volume. Hypoattenuation of white matter of a mild nature favored to represent chronic microvascular ischemic change. Vascular: No hyperdense vessel or unexpected calcification. Atheromatous change in the cavernous carotid arteries, stable. Skull: Normal. Negative for fracture or focal lesion. Sinuses/Orbits: No acute finding. Other: None. ASPECTS [facility] Stroke Program Early CT Score) - Ganglionic level infarction (caudate, lentiform nuclei, internal capsule, insula, M1-M3 cortex): 7 - Supraganglionic infarction (M4-M6 cortex): 3 Total score (0-10 with 10 being normal): 10 Compared with prior imaging, no significant change. IMPRESSION: 1. Atrophy and small vessel disease similar to prior MR. No acute intracranial findings. 2. ASPECTS is 10. These results were called by telephone at the time of interpretation on 06/25/2016 at 11:56 am to Dr. Nance Pear , who verbally acknowledged these results. Electronically Signed   By: Staci Righter M.D.   On: 06/25/2016 11:57    Assessment: 83 y.o. male presenting with complaints of a left hemiparesis and slurred speech.  Patient on Eliquis therefore not a tPA candidate.  With NIHSS of 3 does not fulfill criteria for intervention but patient well  within the window.  Will likely be better served at a facility where  intervention is available.  MR imaging possible at outside facility as well.  Patient with stroke work up this month that will not require repeat at this time.   Case discussed with Dr. Shon Hale  Stroke Risk Factors - hyperlipidemia and hypertension  Plan: 1. MRI of the brain without contrast 2. PT consult, OT consult, Speech consult 3. Prophylactic therapy-Continue Eliquis 4. NPO until RN stroke swallow screen 5. Telemetry monitoring 6. Frequent neuro checks   Alexis Goodell, MD Neurology [phone] 06/25/2016, 12:24 PM

## 2016-06-25 NOTE — ED Notes (Signed)
To CT scan with RN/monitor

## 2016-06-25 NOTE — Consult Note (Signed)
Neurology Consult Note  Reason for Consultation: Left-sided numbness  Requesting provider: Linna Darner, MD  CC:   HPI: This is an 44-yo man who is transferred from [facility] for presumed stroke. History is obtained from the patient as well as from his medical records. I also discussed the case with Dr. Alexis Goodell, the neurohospitalist who evaluated that patient at [facility].   The patient previously presented to [facility] on 06/14/16 after he developed to abrupt onset of L arm numbness. Symptoms had fully resolved by the time he made it to the hospital. He was seen in consultation by neurology (Dr. Doy Mince) who documented some mild sensory disturbance on the L side of the face but no other deficits. Due to his minimal deficits, he was not a candidate for thrombolytic therapy and this was deferred. He was admitted for TIA evaluation. MRI brain showed no acute ischemia with mild atrophy and mild small vessel disease. MRA of the head showed no flow limiting stenosis. Carotid Dopplers showed no hemodynamically significant stenosis. TTE was negative for embolic source. Hemoglobin a1c was 5.5 and LDL 56. His symptoms completely resolved per notes. He was discharged on aspirin, Plavix, and Lipitor 40 mg daily with recommendations to follow up with outpatient neurology after discharge. During the admission he was noted to have complete heart block and it was recommended that he have a pacemaker placed. He followed up with cardiology as an outpatient at which time he was noted to have atrial flutter, a new diagnosis for him. He underwent subsequent placement of a MDT dual chamber PPM on 06/19/16. After his pacemaker was placed, he was started on Eliquis and his aspirin and Plavix were discontinued.   Today the patient returned to the ED at [facility] after he developed weakness of the left arm and leg. He also reported some difficulty with his speech. He decided to go to  the hospital due to concern that he could have a head bleed due to his Eliquis. Neurology consultation was obtained and he was seen by Dr. Doy Mince. He told her that he had been having intermittent numbness of the L face and arm since his initial presentation on 06/14/16. Today his symptoms returned but now he also had weakness of the left arm and leg which were new. She documented decreased sensation on the left face, arm, and leg with 5-/5 weakness in the LLE. NIHSS was 3. He was not a candidate for tPA as he is anticoagulated. He was not felt to be a candidate for intervention due to low NIHSS score. However, she felt that he would be best served by being transferred to [facility] where intervention services are available should his condition deteriorate further.   Presently he reports that his symptoms have largely resolved and he is no longer having any weakness. He still has some mild sensory changes along the ulnar aspect of the left forearm and hand.   PMH:  Past Medical History:  Diagnosis Date  . Aortic valve disorders   . Arthritis   . Basal cell carcinoma    "face, nose left shoulder, left arm" (06/19/2016)  . BBB (bundle branch block)    hx right  . Chronic back pain    "neck, thoracic, lower back" (06/19/2016)  . Complete heart block ([place]) 06/2016  . GERD (gastroesophageal reflux disease)   . Heart block    "I've had type I, II Wenke before now" (06/19/2016)  . History of gout   .  History of hiatal hernia    "self dx'd" (06/19/2016)  . Hyperlipidemia   . Hypertension   . Lyme disease    "dx'd by me 2003; cx's showed dx 08/2015"  . Migraine    "3-4/year" (06/19/2016)  . Presence of permanent cardiac pacemaker 06/19/2016  . PVC's (premature ventricular contractions)   . Renal cancer, left ([place]) 2006   S/P cryotherapy  . Spinal stenosis    "cervical, 1 thoracic, lumbar" (06/19/2016)  . Squamous carcinoma    "face, nose left shoulder, left arm" (06/19/2016)  . TIA (transient  ischemic attack) 06/14/2016   "I'm not sure that's what it was" (06/19/2016)    PSH:  Past Surgical History:  Procedure Laterality Date  . ANKLE FRACTURE SURGERY Right 1967  . BASAL CELL CARCINOMA EXCISION     "face, nose left shoulder, left arm"  . BIOPSY PROSTATE  2001 & 2003  . CARDIAC CATHETERIZATION  1990's  . FRACTURE SURGERY    . INGUINAL HERNIA REPAIR Left 2012  . INSERT / REPLACE / REMOVE PACEMAKER  06/19/2016  . LAPAROSCOPIC ABLATION RENAL MASS    . PACEMAKER IMPLANT N/A 06/19/2016   Procedure: Pacemaker Implant;  Surgeon: Deboraha Sprang, MD;  Location: [facility];  Service: Cardiovascular;  Laterality: N/A;  . PROSTATE SURGERY    . SQUAMOUS CELL CARCINOMA EXCISION     "face, nose left shoulder, left arm"  . TONSILLECTOMY AND ADENOIDECTOMY      Family history: Family History  Problem Relation Age of Onset  . Heart attack Brother   . Aortic stenosis Mother   . Arthritis Father     Social history:  Social History   Social History  . Marital status: Married    Spouse name: N/A  . Number of children: N/A  . Years of education: N/A   Occupational History  . Not on file.   Social History Main Topics  . Smoking status: Never Smoker  . Smokeless tobacco: Never Used  . Alcohol use 1.2 oz/week    2 Glasses of wine per week  . Drug use: No  . Sexual activity: Yes   Other Topics Concern  . Not on file   Social History Narrative   Independent at baseline. Lives with family     Allergies: Allergies  Allergen Reactions  . Iodine Hives    IVP contrast  . Penicillins Itching    ITCHY FEELING IN FINGERS Has patient had a PCN reaction causing immediate rash, facial/tongue/throat swelling, SOB or lightheadedness with hypotension: No Has patient had a PCN reaction causing severe rash involving mucus membranes or skin necrosis: No Has patient had a PCN reaction that required hospitalization No Has patient had a PCN reaction occurring within the last 10  years: No If all of the above answers are "NO", then may proceed with Cephalosporin use.     ROS: As per HPI. A full 14-point review of systems was performed and is otherwise unremarkable.  PE:  Ht 5\' 7"  (1.702 m)   Wt 78 kg (172 lb)   BMI 26.94 kg/m   General: WDWN, no acute distress. AAO x4. Speech clear, no dysarthria. No aphasia. Follows commands briskly. Affect is bright with congruent mood. Comportment is normal.  HEENT: Normocephalic. Neck supple without LAD. MMM, OP clear. Dentition good. Sclerae anicteric. No conjunctival injection.  CV: Regular, no murmur. Carotid pulses full and symmetric, no bruits. Distal pulses 2+ and symmetric.  Lungs: CTAB.  Abdomen: Soft, non-distended, non-tender.  Extremities:  No C/C/E. Neuro:  CN: Pupils are equal and round. They are symmetrically reactive from 3-->2 mm. EOMI without nystagmus. No reported diplopia. Facial sensation is notable for possible slight decrease in light touch and pinprick over the left side of his face. Face is symmetric at rest with normal strength and mobility. Hearing is intact to conversational voice. Palate elevates symmetrically and uvula is midline. Voice is normal in tone, pitch and quality. Bilateral SCM and trapezii are 5/5. Tongue is midline with normal bulk and mobility.  Motor: Normal bulk, tone, and strength with the exception of 4+/5 grips and finger extension bilaterally. No tremor or other abnormal movements. No drift.  Sensation: Intact to light touch and pinprick throughout.   DTRs: Brisk 2+, symmetric. Toes downgoing bilaterally. No pathologic reflexes.  Coordination: Finger-to-nose without dysmetria. Finger taps are normal in amplitude and speed, no decrement.     Labs:  Lab Results  Component Value Date   WBC 8.8 06/25/2016   HGB 16.2 06/25/2016   HCT 49.4 06/25/2016   PLT 145 (L) 06/25/2016   GLUCOSE 102 (H) 06/25/2016   CHOL 142 06/14/2016   TRIG 38 06/14/2016   HDL 59 06/14/2016   LDLCALC  75 06/14/2016   ALT 26 06/25/2016   AST 35 06/25/2016   NA 134 (L) 06/25/2016   K 4.5 06/25/2016   CL 101 06/25/2016   CREATININE 1.48 (H) 06/25/2016   BUN 20 06/25/2016   CO2 26 06/25/2016   TSH 2.16 08/04/2015   PSA 5.8 08/22/2014   INR 1.06 06/25/2016   HGBA1C 5.5 06/14/2016   Troponin <0.03  Imaging:  I have personally and independently reviewed the [facility] without contrast from today. This shows mild diffuse generalized atrophy and mild small vessel disease, no acute pathology.   I have personally and independently reviewed the MRI brain without contrast from 06/15/16. There is no acute pathology. There is mild chronic small vessel disease involving the bihemispheric white matter. Mild diffuse generalized atrophy is present.   I have personally and independently reviewed the MRA of the head from 06/15/16. The left vertebral is dominant. Vessel otherwise unremarkable.   Other diagnostic studies:  TTE with bubble 06/15/16: Nl LV cavity size; EF 55-60%; no regional wall motion abnormalities; trivial TR; mild MR; R ventricle mildly dilated; No ASD or PFO; No R-->L shunt  Carotid Dopplers 06/15/16: No hemodynamically significant stenosis   Assessment and Plan:  1. Possible stroke: Presentation is concerning for a possible acute ischemic small vessel stroke. It is most likely thrombotic in etiology. While he had a recent TIA with L face/arm numbness on 2/9, symptoms today were different with numbness more intense and also involving the L leg as well as weakness of the L arm and leg and possible slurred speech. Known risk factors for cerebrovascular disease in this patient include atrial flutter, recent TIA, HTN, hyperlipidemia, and age. He just had a complete stroke workup that was unrevealing so there is no role for repeating TTE, carotids, Hgb a1c, or lipids and these have been discontinued. MRI brain and MRA head will be obtained to look for evidence of a new stroke and to reassess  intracranial vessels. However, given that symptoms are most suggestive of a small vessel event, MRA and CTA may not adequately evaluate the vessel in question. Consideration could be given to a conventional angiogram but it is not clear that this would change management in any significant way. He does report a diagnosis of Lyme disease which has rarely  been associated with stroke due to cerebral vasculitis. This would seem unlikely given that he has apparently been treated with antibiotics for his Lyme.   Recommendations: >continue Eliquis >continue atorvastatin >MRI brain without contrast >MRA head >avoid hypotension (SBP<176mmHg) >NS 100 cc/hr x 1 liter   2. Left hemiparesis: This was acute but has since resolved. No acute issues at this time. Follow exam.   3. Left hemisensory loss: this was acute, now resolved. He has had intermittent numbness of the L face and arm since 2/9 with much more dense sensory loss of the entire left side today. MRI brain, follow exam.   4. Dysarthria: This was acute, now resolved. Follow exam.   This was discussed with the patient and his family at the bedside. They are in agreement with the plan as noted. They were given the opportunity to ask any questions and these were addressed to their satisfaction.   Thank you for this consult. The stroke team will assume care beginning 06/26/16. Please call if any urgent questions or concerns.

## 2016-06-26 ENCOUNTER — Observation Stay (HOSPITAL_COMMUNITY): Payer: Medicare Other

## 2016-06-26 ENCOUNTER — Observation Stay (HOSPITAL_BASED_OUTPATIENT_CLINIC_OR_DEPARTMENT_OTHER): Payer: Medicare Other

## 2016-06-26 DIAGNOSIS — G8192 Hemiplegia, unspecified affecting left dominant side: Secondary | ICD-10-CM | POA: Diagnosis not present

## 2016-06-26 DIAGNOSIS — Z950 Presence of cardiac pacemaker: Secondary | ICD-10-CM | POA: Diagnosis not present

## 2016-06-26 DIAGNOSIS — I6789 Other cerebrovascular disease: Secondary | ICD-10-CM

## 2016-06-26 DIAGNOSIS — I10 Essential (primary) hypertension: Secondary | ICD-10-CM | POA: Diagnosis not present

## 2016-06-26 DIAGNOSIS — R4781 Slurred speech: Secondary | ICD-10-CM | POA: Diagnosis not present

## 2016-06-26 DIAGNOSIS — R2990 Unspecified symptoms and signs involving the nervous system: Secondary | ICD-10-CM | POA: Diagnosis not present

## 2016-06-26 LAB — ECHOCARDIOGRAM LIMITED
AO mean calculated velocity dopler: 115 cm/s
AOVTI: 33.1 cm
AV Mean grad: 6 mmHg
AV Peak grad: 10 mmHg
AV peak Index: 0.91
AV vel: 1.65
AVA: 1.65 cm2
AVAREAMEANV: 1.67 cm2
AVAREAMEANVIN: 0.87 cm2/m2
AVAREAVTI: 1.74 cm2
AVAREAVTIIND: 0.87 cm2/m2
AVCELMEANRAT: 0.48
AVPKVEL: 160 cm/s
Ao pk vel: 0.5 m/s
CHL CUP STROKE VOLUME: 50 mL
FS: 30 % (ref 28–44)
Height: 67 in
IVS/LV PW RATIO, ED: 1.12
LA diam index: 1.99 cm/m2
LASIZE: 38 mm
LDCA: 3.46 cm2
LEFT ATRIUM END SYS DIAM: 38 mm
LV PW d: 13.1 mm — AB (ref 0.6–1.1)
LV SIMPSON'S DISK: 54
LV sys vol index: 22 mL/m2
LVDIAVOL: 93 mL (ref 62–150)
LVDIAVOLIN: 49 mL/m2
LVOT VTI: 15.8 cm
LVOT diameter: 21 mm
LVOTPV: 80.5 cm/s
LVOTSV: 55 mL
LVOTVTI: 0.48 cm
LVSYSVOL: 42 mL (ref 21–61)
Valve area index: 0.87
Weight: 2673.74 oz

## 2016-06-26 LAB — BASIC METABOLIC PANEL
ANION GAP: 6 (ref 5–15)
BUN: 16 mg/dL (ref 6–20)
CALCIUM: 9.4 mg/dL (ref 8.9–10.3)
CO2: 28 mmol/L (ref 22–32)
Chloride: 103 mmol/L (ref 101–111)
Creatinine, Ser: 1.46 mg/dL — ABNORMAL HIGH (ref 0.61–1.24)
GFR, EST AFRICAN AMERICAN: 50 mL/min — AB (ref >60)
GFR, EST NON AFRICAN AMERICAN: 43 mL/min — AB (ref >60)
Glucose, Bld: 106 mg/dL — ABNORMAL HIGH (ref 65–99)
Potassium: 4.3 mmol/L (ref 3.5–5.1)
Sodium: 137 mmol/L (ref 135–145)

## 2016-06-26 LAB — CBC
HEMATOCRIT: 45.6 % (ref 39.0–52.0)
Hemoglobin: 15.2 g/dL (ref 13.0–17.0)
MCH: 29.3 pg (ref 26.0–34.0)
MCHC: 33.3 g/dL (ref 30.0–36.0)
MCV: 88 fL (ref 78.0–100.0)
PLATELETS: 136 10*3/uL — AB (ref 150–400)
RBC: 5.18 MIL/uL (ref 4.22–5.81)
RDW: 14.1 % (ref 11.5–15.5)
WBC: 7.7 10*3/uL (ref 4.0–10.5)

## 2016-06-26 MED ORDER — ACETAMINOPHEN 325 MG PO TABS: 650.0000 mg | ORAL_TABLET | Freq: Four times a day (QID) | ORAL | Status: DC | PRN

## 2016-06-26 MED ORDER — LISINOPRIL 5 MG PO TABS
5.0000 mg | ORAL_TABLET | Freq: Two times a day (BID) | ORAL | Status: DC
  Administered 2016-06-27: 5 mg via ORAL
  Filled 2016-06-26: qty 1

## 2016-06-26 NOTE — Discharge Instructions (Addendum)
Follow with Primary MD [name], MD in 7 days   Get CBC, CMP, 2 view Chest X ray checked  by Primary MD or SNF MD in 5-7 days ( we routinely change or add medications that can affect your baseline labs and fluid status, therefore we recommend that you get the mentioned basic workup next visit with your PCP, your PCP may decide not to get them or add new tests based on their clinical decision)  Activity: As tolerated with Full fall precautions use walker/cane & assistance as needed  Disposition Home   Diet:  Heart Healthy    For Heart failure patients - Check your Weight same time everyday, if you gain over 2 pounds, or you develop in leg swelling, experience more shortness of breath or chest pain, call your Primary MD immediately. Follow Cardiac Low Salt Diet and 1.5 lit/day fluid restriction.  On your next visit with your primary care physician please Get Medicines reviewed and adjusted.  Please request your Prim.MD to go over all Hospital Tests and Procedure/Radiological results at the follow up, please get all Hospital records sent to your Prim MD by signing hospital release before you go home.  If you experience worsening of your admission symptoms, develop shortness of breath, life threatening emergency, suicidal or homicidal thoughts you must seek medical attention immediately by calling 911 or calling your MD immediately  if symptoms less severe.  You Must read complete instructions/literature along with all the possible adverse reactions/side effects for all the Medicines you take and that have been prescribed to you. Take any new Medicines after you have completely understood and accpet all the possible adverse reactions/side effects.   Do not drive, operate heavy machinery, perform activities at heights, swimming or participation in water activities or provide baby sitting services if your were admitted for syncope or siezures until you have seen by Primary MD or a Neurologist  and advised to do so again.  Do not drive when taking Pain medications.    Do not take more than prescribed Pain, Sleep and Anxiety Medications  Special Instructions: If you have smoked or chewed Tobacco  in the last 2 yrs please stop smoking, stop any regular Alcohol  and or any Recreational drug use.  Wear Seat belts while driving.   Please note  You were cared for by a hospitalist during your hospital stay. If you have any questions about your discharge medications or the care you received while you were in the hospital after you are discharged, you can call the unit and asked to speak with the hospitalist on call if the hospitalist that took care of you is not available. Once you are discharged, your primary care physician will handle any further medical issues. Please note that NO REFILLS for any discharge medications will be authorized once you are discharged, as it is imperative that you return to your primary care physician (or establish a relationship with a primary care physician if you do not have one) for your aftercare needs so that they can reassess your need for medications and monitor your lab values.         Information on my medicine - ELIQUIS (apixaban)  This medication education was reviewed with me or my healthcare representative as part of my discharge preparation.    Why was Eliquis prescribed for you? Eliquis was prescribed for you to reduce the risk of a blood clot forming that can cause a stroke if you have a medical condition  called atrial fibrillation (a type of irregular heartbeat).  What do You need to know about Eliquis ? Take your Eliquis TWICE DAILY - one tablet in the morning and one tablet in the evening with or without food. If you have difficulty swallowing the tablet whole please discuss with your pharmacist how to take the medication safely.  Take Eliquis exactly as prescribed by your doctor and DO NOT stop taking Eliquis without talking  to the doctor who prescribed the medication.  Stopping may increase your risk of developing a stroke.  Refill your prescription before you run out.  After discharge, you should have regular check-up appointments with your healthcare provider that is prescribing your Eliquis.  In the future your dose may need to be changed if your kidney function or weight changes by a significant amount or as you get older.  What do you do if you miss a dose? If you miss a dose, take it as soon as you remember on the same day and resume taking twice daily.  Do not take more than one dose of ELIQUIS at the same time to make up a missed dose.  Important Safety Information A possible side effect of Eliquis is bleeding. You should call your healthcare provider right away if you experience any of the following: ? Bleeding from an injury or your nose that does not stop. ? Unusual colored urine (red or dark brown) or unusual colored stools (red or black). ? Unusual bruising for unknown reasons. ? A serious fall or if you hit your head (even if there is no bleeding).  Some medicines may interact with Eliquis and might increase your risk of bleeding or clotting while on Eliquis. To help avoid this, consult your healthcare provider or pharmacist prior to using any new prescription or non-prescription medications, including herbals, vitamins, non-steroidal anti-inflammatory drugs (NSAIDs) and supplements.  This website has more information on Eliquis (apixaban): http://www.eliquis.com/eliquis/home

## 2016-06-26 NOTE — Progress Notes (Signed)
Patient arrived to 5C04 from 5West. Patient ambulated from wheelchair to bed with minimal assist. Wife at bedside. Patient and wife oriented to room, phone and call-light. Vitals taken and stable. Telemetry monitor applied. Will continue to monitor.

## 2016-06-26 NOTE — Evaluation (Signed)
Occupational Therapy Evaluation Patient Details Name: Thomas [name] MRN: [id number] DOB: 1932/10/03 Today's Date: 06/26/2016    History of Present Illness Pt is an 83 y.o. male who presented to the ED with L sided weakness and numbness including face, UE, and LE. Pt with recent TIA admission (06/14/16 at [facility]). At that times he also was noted to have complete heart block and had a pacemaker placed 06/19/16. PMH: aortic valve disorders, arthritis, Bundle Brach Block, complete heart block, hypertension, hyperlipidemia, renal cancer, and TIA. CT with no acute findings.   Clinical Impression   PTA, pt was independent with ADL and functional mobility. He has had slight residual numbness in his L UE in the ulnar distribution from previous TIA but this was not impacting his functional independence. Pt currently presents with worsened decreased sensation in his L UE greatest in his palm and fingertips and remaining in the ulnar aspect of his L forearm. Pt able to complete motor aspects of ADL with modified independence in the hospital setting. 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 Feel pt would benefit from this. Pt has no further acute OT needs and reports no further questions or concerns. Provided handout and education concerning safety with sensation loss and they report understanding. Acute OT will sign off.     Follow Up Recommendations  Outpatient OT    Equipment Recommendations  None recommended by OT    Recommendations for Other Services       Precautions / Restrictions Precautions Precautions: ICD/Pacemaker Precaution Comments: Pacemaker placed 06/19/16. Restrictions Weight Bearing Restrictions: Yes LUE Weight Bearing: Non weight bearing      Mobility Bed Mobility Overal bed mobility: Independent                Transfers Overall transfer level: Independent Equipment used: None             General transfer comment: Pt demonstrates safe technique and good safety awareness.    Balance Overall balance assessment: No apparent balance deficits (not formally assessed)                                          ADL Overall ADL's : Modified independent                                       General ADL Comments: Pt able to complete all ADL in hospital setting with modified independence. He compensates well for sensation deficits.      Vision Patient Visual Report: No change from baseline Vision Assessment?: No apparent visual deficits     Perception     Praxis      Pertinent Vitals/Pain Pain Assessment: No/denies pain     Hand Dominance Right   Extremity/Trunk Assessment Upper Extremity Assessment Upper Extremity Assessment: LUE deficits/detail LUE Deficits / Details: Restricted extremity due to recent pacemaker placement. Decreased light touch sensation and reported numbness in L palm and ulnar side of L forearm.  LUE Sensation: decreased light touch   Lower Extremity Assessment Lower Extremity Assessment: Overall WFL for tasks assessed       Communication Communication Communication: No difficulties   Cognition Arousal/Alertness: Awake/alert Behavior During Therapy: WFL for tasks assessed/performed Overall Cognitive Status: Within Functional  Limits for tasks assessed                     General Comments       Exercises       Shoulder Instructions      Home Living Family/patient expects to be discharged to:: Private residence Living Arrangements: Spouse/significant other Available Help at Discharge: Family;Available PRN/intermittently (Wife works during the day.) Type of Home: House Home Access: Stairs to enter Technical brewer of Steps: 3 Entrance Stairs-Rails: None Home Layout: One level     Bathroom Shower/Tub: Tub/shower unit Shower/tub characteristics: Curtain       Home  Equipment: None          Prior Functioning/Environment Level of Independence: Independent        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        OT Problem List: Impaired sensation      OT Treatment/Interventions:      OT Goals(Current goals can be found in the care plan section) Acute Rehab OT Goals Patient Stated Goal: To figure out what is causing this numbness. OT Goal Formulation: With patient/family Time For Goal Achievement: 07/03/16 Potential to Achieve Goals: Good  OT Frequency:     Barriers to D/C:            Co-evaluation              End of Session    Activity Tolerance: Patient tolerated treatment well Patient left: with family/visitor present;in bed (Nursing signed off as independent.)  OT Visit Diagnosis: Hemiplegia and hemiparesis Hemiplegia - Right/Left: Left Hemiplegia - dominant/non-dominant: Non-Dominant Hemiplegia - caused by: Unspecified                ADL either performed or assessed with clinical judgement  Time: 0955-1016 OT Time Calculation (min): 21 min Charges:  OT General Charges $OT Visit: 1 Procedure OT Evaluation $OT Eval Moderate Complexity: 1 Procedure G-Codes: OT G-codes **NOT FOR INPATIENT CLASS** Functional Assessment Tool Used: AM-PAC 6 Clicks Daily Activity Functional Limitation: Self care Self Care Current Status [id number]): At least 1 percent but less than 20 percent impaired, limited or restricted Self Care Goal Status [id number]): At least 1 percent but less than 20 percent impaired, limited or restricted Self Care Discharge Status [phone]): At least 1 percent but less than 20 percent impaired, limited or restricted   Norman Herrlich, [place] OTR/L  Pager: [place] 06/26/2016, 11:04 AM

## 2016-06-26 NOTE — Progress Notes (Signed)
  Echocardiogram 2D Echocardiogram has been performed.  Thomas [name] 06/26/2016, 10:56 AM

## 2016-06-26 NOTE — Progress Notes (Signed)
Unable to complete MRI as ordered. Pacemaker is MR conditional. However, per manufacturer's guidelines must be implanted for six weeks. [employer] confirmed that we have to wait six weeks prior to being able to perform an MRI. Dr Tyrell Antonio informed of this. Unable to reach Dr Leonie Man to inform him of this.

## 2016-06-26 NOTE — Care Management Note (Signed)
Case Management Note  Patient Details  Name: Thomas [name] MRN: [id number] Date of Birth: 1932/09/11  Subjective/Objective:                  Patient presented with Left sided weakness and numbness, slurred speech. Lives at home with spouse. CM will follow for discharge needs pending PT/OT evals and physician orders.   Action/Plan:   Expected Discharge Date:                  Expected Discharge Plan:     In-House Referral:     Discharge planning Services     Post Acute Care Choice:    Choice offered to:     DME Arranged:    DME Agency:     HH Arranged:    HH Agency:     Status of Service:     If discussed at [employer] of Stay Meetings, dates discussed:    Additional Comments:  Rolm Baptise, RN 06/26/2016, 3:37 PM

## 2016-06-26 NOTE — Progress Notes (Signed)
STROKE TEAM PROGRESS NOTE   SUBJECTIVE (INTERVAL HISTORY) His wife is at the bedside.  She is a Marine scientist in the [employer] system. 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 He still has tingling in L finger tips.    OBJECTIVE Temp:  [97.6 F (36.4 C)-98.8 F (37.1 C)] 97.9 F (36.6 C) (02/21 0935) Pulse Rate:  [51-74] 74 (02/21 0935) Cardiac Rhythm: Ventricular paced (02/21 0700) Resp:  [15-20] 20 (02/21 0935) BP: (133-189)/(66-107) 165/77 (02/21 0938) SpO2:  [95 %-100 %] 96 % (02/21 0935) Weight:  [75.8 kg (167 lb 1.7 oz)-78 kg (172 lb)] 75.8 kg (167 lb 1.7 oz) (02/20 2355)  CBC:  Recent Labs Lab 06/25/16 1143  WBC 8.8  NEUTROABS 4.9  HGB 16.2  HCT 49.4  MCV 88.9  PLT 145*    Basic Metabolic Panel:  Recent Labs Lab 06/25/16 1143  NA 134*  K 4.5  CL 101  CO2 26  GLUCOSE 102*  BUN 20  CREATININE 1.48*  CALCIUM 9.5    PHYSICAL EXAM Pleasant elderly caucasian male not in distress. . Afebrile. Head is nontraumatic. Neck is supple without bruit.    Cardiac exam no murmur or gallop. Lungs are clear to auscultation. Distal pulses are well felt. Neurological Exam ;  Awake  Alert oriented x 3. Normal speech and language.eye movements full without nystagmus.fundi were not visualized. Vision acuity and fields appear normal. Hearing is normal. Palatal movements are normal. Face symmetric. Tongue midline. Normal strength, tone, reflexes and coordination. Normal sensation. Gait deferred.   ASSESSMENT/PLAN Thomas [name] is a 83 y.o. 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 He did not receive IV t-PA due to being on eliquis.   Stroke vs TIA, likely due to small vessel .complicated migraine less likely. Location not consistent with  embolic source  Resultant  L hand paresthesias   CT head code stroke atrophy. small vessel disease. Aspects 10  MRI  pending   Carotid Doppler  bilateral ICA, right greater than left, but less than 50% diameter stenosis. Bilateral VA flow antegrade  2D Echo  pending  LDL 75  HgbA1c 5.5  eliquis for VTE prophylaxis  Diet Heart Room service appropriate? Yes; Fluid consistency: Thin  Eliquis (apixaban) daily prior to admission, now on Eliquis (apixaban) daily. Continue at discharge  Therapy recommendations:  No PT, OP OT  Disposition:  Return home  Atrial flutter   On Eliquis, prior to admission   Hypertension  Stable  Permissive hypertension (OK if < 220/120) but gradually normalize in 5-7 days  Long-term BP goal normotensive  Hyperlipidemia  Home meds:  Lipitor 40, resumed in hospital  LDL above goal  Continue statin at discharge  Other Stroke Risk Factors  ETOH use  UDS not checked  Migraines - had migraine on Sunday. Hx neuro migraine 30 years ago. Increasing frequency of migraines with aura only, no headache  Other Active Problems  CHB s/p pacer  GERD  CKD  Hx Lyme disease  Gout  Hospital day # 0  Radene Journey [facility] for Pager information 06/26/2016 2:27 PM  I have personally examined this patient, reviewed notes, independently viewed imaging studies, participated in medical decision making and plan of care.ROS completed by me personally and pertinent positives fully documented  I have made any  additions or clarifications directly to the above note. Agree with note above. Check MRI scan of the brain with thin sections through the brain stem. Continue eliquis for stroke prevention for atrial fibrillation. Long discussion with the patient and wife at the bedside and answered questions. Greater than 50% time during this 35 minute visit was spent on counseling and coordination of care about strokes, small vessel  disease,atrial fibrillation, risk for recurrence, prevention and treatment discussion and answering questions  Thomas Contras, MD Medical Director [facility] Pager: [phone] 06/26/2016 2:44 PM  To contact Stroke Continuity provider, please refer to [email or web address] After hours, contact General Neurology

## 2016-06-26 NOTE — Progress Notes (Addendum)
PROGRESS NOTE    Thomas [name]  [id number] DOB: 1932-10-13 DOA: 06/25/2016 PCP: [name], MD   Brief Narrative: Thomas [name] is a 83 y.o. male with medical history significant of recent diagnosis of TIA (06/14/2016), atrial flutter - on Eliquis, symptomatic third-degree AV block status post permanent pacemaker implantation (06/19/2016), hypertension, hyperlipidemia, GERD, gout, Lyme disease who presented to the [facility] with c/o left sided numbness weakness and slurred speech. The symptoms of left facial and left upper extremity and numbness have been intermittently present since the first diagnosis of TIA in the beginning of this month. This morning, around 3 o'clock. patient again developed the same but it spread to the left lower extremity and in addition to numbness patient developed left sided weakness. Currently patient has mild numbness of the medial side of the hand and 5 th finger, otherwise symptoms resolved  ED Course: on arrival to the ED of the [facility] hospital with left sided hemiparesis and slurred speech. On presentation he had elevated blood pressure 155/101 mmHg, blood work showed  renal insufficiency with creatinine of 1.48, which is near his baseline and GFR is 49. He was seen by neurology in consultation. Initial NIHSS was 3.  Due to his contrast allergy and unavailability of MRI compatible with the pacemeaker, patient was transferred to [facility].  Assessment & Plan:   Principal Problem:   Hemiparesis (Thomas [name]) Active Problems:   HYPERTENSION, BENIGN   Hyperlipidemia   Esophageal reflux   TIA (transient ischemic attack)   Complete heart block (HCC)   Cardiac pacemaker in situ   Acute ischemic stroke (Thomas [name])   Acute left hemiparesis (HCC)   Hemisensory loss   Dysarthria   Stroke-like symptoms  1-Stroke like symptoms;  Presents with left side weakness, and numbness. Weakness has resolved. Only has residual numbness in his hand.  He has  been on eliquis. Plan to continue with this medications.  MRI ordered.  Continue with statins.  Permissive HTN until Stroke is rule out.  ECHO pending.  Discussed with neurology, ok to resume testosterone at discharge.   2-HTN;  His cardiologist increase lisinopril to 10 mg. He takes 5 mg BID. He would take second dose if BP is elevated.  I have change lisinopril to BID.  On hydralazine PRN.  Would hold alfa blocker for now.   3-CKD stage III; cr baseline 1.4--1.5 Would monitor on Lisinopril.   4-hemoconcentration; received IV fluids. Repeat labs.    5-AV Block, S/P pacemaker; A fib ? On eliquis.   6-Palpitation;  He relates that he takes propanolol for symptomatic palpitation.  Will order propanol but will need to get EKG and BP check prior to administration.      DVT prophylaxis: Eliquis Code Status: full code.  Family Communication: wife who was at bedside.  Disposition Plan: remain inpatient.   Consultants:   Neurology    Procedures: echo pending.   Antimicrobials: none  Subjective: He is feeling well, report resolution of left arm weakness. Only has mild numbness left hand    Objective: Vitals:   06/25/16 2225 06/25/16 2355 06/26/16 0530 06/26/16 0900  BP:   (!) 153/83 138/66  Pulse: (!) 51 71 67 68  Resp: 18  18 18   Temp: 98.6 F (37 C) 98 F (36.7 C) 98.3 F (36.8 C) 98.4 F (36.9 C)  TempSrc: Oral Oral Oral Oral  SpO2: 98% 96% 98% 99%  Weight:  75.8 kg (167 lb 1.7 oz)    Height:  5\' 7"  (1.702 m)      Intake/Output Summary (Last 24 hours) at 06/26/16 1238 Last data filed at 06/25/16 2218  Gross per 24 hour  Intake            51.67 ml  Output              575 ml  Net          -523.33 ml   Filed Weights   06/25/16 1438 06/25/16 2355  Weight: 78 kg (172 lb) 75.8 kg (167 lb 1.7 oz)    Examination:  General exam: Appears calm and comfortable  Respiratory system: Clear to auscultation. Respiratory effort normal. Cardiovascular system:  S1 & S2 heard, RRR. No JVD, murmurs, rubs, gallops or clicks. No pedal edema. Gastrointestinal system: Abdomen is nondistended, soft and nontender. No organomegaly or masses felt. Normal bowel sounds heard. Central nervous system: Alert and oriented. No focal neurological deficits. Extremities: Symmetric 5 x 5 power. Skin: No rashes, lesions or ulcers Psychiatry: Judgement and insight appear normal. Mood & affect appropriate.     Data Reviewed: I have personally reviewed following labs and imaging studies  CBC:  Recent Labs Lab 06/25/16 1143  WBC 8.8  NEUTROABS 4.9  HGB 16.2  HCT 49.4  MCV 88.9  PLT [id number]*   Basic Metabolic Panel:  Recent Labs Lab 06/25/16 1143  NA 134*  K 4.5  CL 101  CO2 26  GLUCOSE 102*  BUN 20  CREATININE 1.48*  CALCIUM 9.5   GFR: Estimated Creatinine Clearance: 36.6 mL/min (by C-G formula based on SCr of 1.48 mg/dL (H)). Liver Function Tests:  Recent Labs Lab 06/25/16 1143  AST 35  ALT 26  ALKPHOS 83  BILITOT 0.6  PROT 7.1  ALBUMIN 4.1   No results for input(s): LIPASE, AMYLASE in the last 168 hours. No results for input(s): AMMONIA in the last 168 hours. Coagulation Profile:  Recent Labs Lab 06/25/16 1143  INR 1.06   Cardiac Enzymes:  Recent Labs Lab 06/25/16 1143  TROPONINI <0.03   BNP (last 3 results) No results for input(s): PROBNP in the last 8760 hours. HbA1C: No results for input(s): HGBA1C in the last 72 hours. CBG:  Recent Labs Lab 06/25/16 1159  GLUCAP 95   Lipid Profile: No results for input(s): CHOL, HDL, LDLCALC, TRIG, CHOLHDL, LDLDIRECT in the last 72 hours. Thyroid Function Tests: No results for input(s): TSH, T4TOTAL, FREET4, T3FREE, THYROIDAB in the last 72 hours. Anemia Panel: No results for input(s): VITAMINB12, FOLATE, FERRITIN, TIBC, IRON, RETICCTPCT in the last 72 hours. Sepsis Labs: No results for input(s): PROCALCITON, LATICACIDVEN in the last 168 hours.  Recent Results (from the past  240 hour(s))  Surgical pcr screen     Status: None   Collection Time: 06/19/16  1:37 PM  Result Value Ref Range Status   MRSA, PCR NEGATIVE NEGATIVE Final   Staphylococcus aureus NEGATIVE NEGATIVE Final    Comment:        The Xpert SA Assay (FDA approved for NASAL specimens in patients over 32 years of age), is one component of a comprehensive surveillance program.  Test performance has been validated by [facility] for patients greater than or equal to 47 year old. It is not intended to diagnose infection nor to guide or monitor treatment.          Radiology Studies: Ct Head Code Stroke W/o Cm  Result Date: 06/25/2016 CLINICAL DATA:  Code stroke.  LEFT-sided weakness.  Pacemaker. EXAM: CT  HEAD WITHOUT CONTRAST TECHNIQUE: Contiguous axial images were obtained from the base of the skull through the vertex without intravenous contrast. COMPARISON:  MR head 06/15/2016.  CT head 06/14/2016. FINDINGS: Brain: No evidence of acute infarction, hemorrhage, hydrocephalus, extra-axial collection or mass lesion/mass effect. Normal for age cerebral volume. Hypoattenuation of white matter of a mild nature favored to represent chronic microvascular ischemic change. Vascular: No hyperdense vessel or unexpected calcification. Atheromatous change in the cavernous carotid arteries, stable. Skull: Normal. Negative for fracture or focal lesion. Sinuses/Orbits: No acute finding. Other: None. ASPECTS [facility] Stroke Program Early CT Score) - Ganglionic level infarction (caudate, lentiform nuclei, internal capsule, insula, M1-M3 cortex): 7 - Supraganglionic infarction (M4-M6 cortex): 3 Total score (0-10 with 10 being normal): 10 Compared with prior imaging, no significant change. IMPRESSION: 1. Atrophy and small vessel disease similar to prior MR. No acute intracranial findings. 2. ASPECTS is 10. These results were called by telephone at the time of interpretation on 06/25/2016 at 11:56 am to Dr. Nance Pear  , who verbally acknowledged these results. Electronically Signed   By: Staci Righter M.D.   On: 06/25/2016 11:57        Scheduled Meds: . apixaban  2.5 mg Oral BID  . artificial tears  1 application Both Eyes QHS  . atorvastatin  40 mg Oral q1800  . febuxostat  20 mg Oral Daily  . lisinopril  10 mg Oral Daily  . omega-3 acid ethyl esters  1 g Oral BID  . pantoprazole  40 mg Oral Daily   Continuous Infusions:   LOS: 0 days    Time spent: 35 minutes.    Elmarie Shiley, MD [facility] Pager [phone]  If 7PM-7AM, please contact night-coverage [email or web address] Password [facility] 06/26/2016, 12:38 PM

## 2016-06-26 NOTE — Progress Notes (Signed)
PT Cancellation Note  Patient Details Name: Thomas [name] MRN: [id number] DOB: [birth year]   Cancelled Treatment:    Reason Eval/Treat Not Completed: PT screened, no needs identified, will sign off; spoke with pt and OT who both report no current PT issues with LE weakness or balance.  Will sign off, pt to request return if further issues arise.    Reginia Naas 06/26/2016, 11:09 AM  Magda Kiel, [place] 06/26/2016

## 2016-06-27 ENCOUNTER — Observation Stay (HOSPITAL_COMMUNITY): Payer: Medicare Other

## 2016-06-27 DIAGNOSIS — G8192 Hemiplegia, unspecified affecting left dominant side: Secondary | ICD-10-CM | POA: Diagnosis not present

## 2016-06-27 DIAGNOSIS — R4781 Slurred speech: Secondary | ICD-10-CM | POA: Diagnosis not present

## 2016-06-27 DIAGNOSIS — R0683 Snoring: Secondary | ICD-10-CM

## 2016-06-27 DIAGNOSIS — I442 Atrioventricular block, complete: Secondary | ICD-10-CM | POA: Diagnosis not present

## 2016-06-27 DIAGNOSIS — I10 Essential (primary) hypertension: Secondary | ICD-10-CM | POA: Diagnosis not present

## 2016-06-27 DIAGNOSIS — R2990 Unspecified symptoms and signs involving the nervous system: Secondary | ICD-10-CM | POA: Diagnosis not present

## 2016-06-27 DIAGNOSIS — Z950 Presence of cardiac pacemaker: Secondary | ICD-10-CM | POA: Diagnosis not present

## 2016-06-27 MED ORDER — DOCUSATE SODIUM 100 MG PO CAPS
200.0000 mg | ORAL_CAPSULE | Freq: Every day | ORAL | Status: DC
Start: 2016-06-27 — End: 2016-06-27

## 2016-06-27 NOTE — Care Management Note (Signed)
Case Management Note  Patient Details  Name: Thomas [name] MRN: [id number] Date of Birth: 1933-01-30  Subjective/Objective:                    Action/Plan: Patient discharging home with self care. Pt with recommendations from OT for outpatient therapy. Pt does not feel he needs this at this time. CM encouraged him to notify Dr Leonie Man or his PCP if changes his mind. Pt in agreement. Pt has transportation home, PCP and insurance. No further needs per CM.   Expected Discharge Date:  06/27/16               Expected Discharge Plan:  Home/Self Care  In-House Referral:     Discharge planning Services  CM Consult  Post Acute Care Choice:    Choice offered to:     DME Arranged:    DME Agency:     HH Arranged:    HH Agency:     Status of Service:  Completed, signed off  If discussed at [employer] of Stay Meetings, dates discussed:    Additional Comments:  Pollie Friar, RN 06/27/2016, 4:26 PM

## 2016-06-27 NOTE — Progress Notes (Signed)
STROKE TEAM PROGRESS NOTE   SUBJECTIVE (INTERVAL HISTORY) His wife is at the bedside.   Marland Kitchen He still has tingling in L finger tips But it is improving. Patient cannot have an MRI even though his pacemaker is MRI compatible as his pacemaker was placed only 2 weeks ago.    OBJECTIVE Temp:  [97.4 F (36.3 C)-98 F (36.7 C)] 98 F (36.7 C) (02/22 1007) Pulse Rate:  [50-67] 65 (02/22 1007) Cardiac Rhythm: Ventricular paced;Bundle branch block;Atrial flutter (02/22 0705) Resp:  [17-20] 17 (02/22 1007) BP: (123-171)/(67-89) 123/73 (02/22 1007) SpO2:  [97 %-99 %] 97 % (02/22 1007)  CBC:   Recent Labs Lab 06/25/16 1143 06/26/16 1500  WBC 8.8 7.7  NEUTROABS 4.9  --   HGB 16.2 15.2  HCT 49.4 45.6  MCV 88.9 88.0  PLT 145* 136*    Basic Metabolic Panel:   Recent Labs Lab 06/25/16 1143 06/26/16 1500  NA 134* 137  K 4.5 4.3  CL 101 103  CO2 26 28  GLUCOSE 102* 106*  BUN 20 16  CREATININE 1.48* 1.46*  CALCIUM 9.5 9.4    PHYSICAL EXAM Pleasant elderly caucasian male not in distress. . Afebrile. Head is nontraumatic. Neck is supple without bruit.    Cardiac exam no murmur or gallop. Lungs are clear to auscultation. Distal pulses are well felt. Neurological Exam ;  Awake  Alert oriented x 3. Normal speech and language.eye movements full without nystagmus.fundi were not visualized. Vision acuity and fields appear normal. Hearing is normal. Palatal movements are normal. Face symmetric. Tongue midline. Normal strength, tone, reflexes and coordination. Normal sensation. Gait deferred.   ASSESSMENT/PLAN Thomas [name] is a 83 y.o. 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 He did not receive IV t-PA due to being on eliquis.   Stroke vs TIA, likely  due to small vessel .complicated migraine less likely. Location not consistent with embolic source  Resultant  L hand paresthesias   CT head code stroke atrophy. small vessel disease. Aspects 10  MRI  pending   Carotid Doppler  bilateral ICA, right greater than left, but less than 50% diameter stenosis. Bilateral VA flow antegrade 2D Echo  Left ventricle: The cavity size was normal. Wall thickness was   normal. Systolic function was normal. The estimated ejection   fraction was in the range of 55% to 60%. Wall motion was normal;    there were no regional wall motion abnormalities.  LDL 75  HgbA1c 5.5  eliquis for VTE prophylaxis Diet Heart Room service appropriate? Yes; Fluid consistency: Thin  Eliquis (apixaban) daily prior to admission, now on Eliquis (apixaban) daily. Continue at discharge  Therapy recommendations:  No PT, OP OT  Disposition:  Return home  Atrial flutter   On Eliquis, prior to admission   Hypertension  Stable  Permissive hypertension (OK if < 220/120) but gradually normalize in 5-7 days  Long-term BP goal normotensive  Hyperlipidemia  Home meds:  Lipitor 40, resumed in hospital  LDL above goal  Continue statin at discharge  Other Stroke Risk Factors  ETOH use  UDS not checked  Migraines - had migraine on Sunday. Hx neuro migraine 30 years ago. Increasing frequency of migraines with aura only, no headache  Other Active Problems  CHB s/p pacer  GERD  CKD  Hx Lyme  disease  Gout  Hospital day # 0   I have personally examined this patient, reviewed notes, independently viewed imaging studies, participated in medical decision making and plan of care.ROS completed by me personally and pertinent positives fully documented  I have made any additions or clarifications directly to the above note.  . Continue eliquis for stroke prevention for atrial fibrillation. Long discussion with the patient and wife at the bedside and answered  questions.  Discussed with Dr. Candiss Norse. Stroke team will sign off. Follow-up as an outpatient in stroke clinic in 6 weeks. Kindly call for questions. Antony Contras, MD Medical Director [facility] Pager: [phone] 06/27/2016 1:20 PM  To contact Stroke Continuity provider, please refer to [email or web address] After hours, contact General Neurology

## 2016-06-27 NOTE — Progress Notes (Signed)
Was called to the room by patient's wife that patient has a hematoma around the pace maker site. Per patient, he strained while having bowel movement hence the hematoma. Ice placed. MD paged to notify.

## 2016-06-27 NOTE — Discharge Summary (Signed)
Thomas [name] [id number] DOB: [birth year] DOA: 06/25/2016  PCP: [name], MD  Admit date: 06/25/2016  Discharge date: 06/27/2016  Admitted From: Home  Disposition:  Home   Recommendations for Outpatient Follow-up:   Follow up with PCP in 1-2 weeks  PCP Please obtain BMP/CBC, 2 view CXR in 1week,  (see Discharge instructions)   PCP Please follow up on the following pending results: None   Home Health: OT   Equipment/Devices: None  Consultations: Neuro Discharge Condition: Stable   CODE STATUS: Full   Diet Recommendation: Heart Healthy    No chief complaint on file.    Brief history of present illness from the day of admission and additional interim summary    Thomas [name] is a 83 y.o. male with medical history significant of recent diagnosis of TIA (06/14/2016), atrial flutter - on Eliquis, symptomatic third-degree AV block status post permanent pacemaker implantation (06/19/2016), hypertension, hyperlipidemia, GERD, gout, Lyme disease who presented to the [facility] with c/o left sided numbness weakness and slurred speech.                                                                  Hospital Course   Left arm tingling numbness and questionable slurred speech. -  likely small vessel disease induced TIA. MRI could not be done due to pacemaker, pacemaker needs to be 6 months or older before MRI can be done. Symptoms are considerably better. No slurred speech. Mild left arm tingling. Seen and cleared by neurology for home discharge. Continue Eliquis, and follow with neurology in the outpatient setting along with PCP post discharge. Patient underwent a recent stroke work up with carotid US and TTE Most recent TTE was on 06/15/2016 - EF 55-60%, normal wall motion Carotid UA did not  reveal significant strenosis Hgb A1C 5.5%    HTN  Home regimen continued  Hyperlipidemia Last lipid profile on 06/14/2016 showed normal tchol, HDL and TGL, slightly above goal LDL - 75 Continue statin therapy with Lipitor  Complete heart block - s/p recent PTV PM implantation Continue to observe for rhythm disturbance and pacer malfunctioning  GERD Continue PPI and monitor symptoms  CKD - avoid nephrotoxic agents, monitor renal function  History of Lyme disease Patient has been treated with antibiotics     Discharge diagnosis     Principal Problem:   Hemiparesis ([place]) Active Problems:   HYPERTENSION, BENIGN   Hyperlipidemia   Esophageal reflux   TIA (transient ischemic attack)   Complete heart block ([facility])   Cardiac pacemaker in situ   Acute ischemic stroke ([place])   Acute left hemiparesis ([facility])   Hemisensory loss   Dysarthria   Stroke-like symptoms   Snores    Discharge instructions    Discharge Instructions  Ambulatory referral to Neurology    Complete by:  As directed    Stroke patient. Dr. Leonie Man prefers follow up in 6 weeks   Ambulatory referral to Sleep Studies    Complete by:  As directed    Patient with stroke. Wife reports snoring with occasional gasps. Consider for sleep study   Diet - low sodium heart healthy    Complete by:  As directed    Discharge instructions    Complete by:  As directed    Follow with Primary MD [name], MD in 7 days   Get CBC, CMP, 2 view Chest X ray checked  by Primary MD or SNF MD in 5-7 days ( we routinely change or add medications that can affect your baseline labs and fluid status, therefore we recommend that you get the mentioned basic workup next visit with your PCP, your PCP may decide not to get them or add new tests based on their clinical decision)  Activity: As tolerated with Full fall precautions use walker/cane & assistance as needed  Disposition Home   Diet:  Heart Healthy    For Heart  failure patients - Check your Weight same time everyday, if you gain over 2 pounds, or you develop in leg swelling, experience more shortness of breath or chest pain, call your Primary MD immediately. Follow Cardiac Low Salt Diet and 1.5 lit/day fluid restriction.  On your next visit with your primary care physician please Get Medicines reviewed and adjusted.  Please request your Prim.MD to go over all Hospital Tests and Procedure/Radiological results at the follow up, please get all Hospital records sent to your Prim MD by signing hospital release before you go home.  If you experience worsening of your admission symptoms, develop shortness of breath, life threatening emergency, suicidal or homicidal thoughts you must seek medical attention immediately by calling 911 or calling your MD immediately  if symptoms less severe.  You Must read complete instructions/literature along with all the possible adverse reactions/side effects for all the Medicines you take and that have been prescribed to you. Take any new Medicines after you have completely understood and accpet all the possible adverse reactions/side effects.   Do not drive, operate heavy machinery, perform activities at heights, swimming or participation in water activities or provide baby sitting services if your were admitted for syncope or siezures until you have seen by Primary MD or a Neurologist and advised to do so again.  Do not drive when taking Pain medications.    Do not take more than prescribed Pain, Sleep and Anxiety Medications  Special Instructions: If you have smoked or chewed Tobacco  in the last 2 yrs please stop smoking, stop any regular Alcohol  and or any Recreational drug use.  Wear Seat belts while driving.   Please note  You were cared for by a hospitalist during your hospital stay. If you have any questions about your discharge medications or the care you received while you were in the hospital after you are  discharged, you can call the unit and asked to speak with the hospitalist on call if the hospitalist that took care of you is not available. Once you are discharged, your primary care physician will handle any further medical issues. Please note that NO REFILLS for any discharge medications will be authorized once you are discharged, as it is imperative that you return to your primary care physician (or establish a relationship with a primary care physician if you  do not have one) for your aftercare needs so that they can reassess your need for medications and monitor your lab values.   Increase activity slowly    Complete by:  As directed       Discharge Medications   Allergies as of 06/27/2016      Reactions   Iodine Hives   IVP contrast   Penicillins Itching   ITCHY FEELING IN FINGERS Has patient had a PCN reaction causing immediate rash, facial/tongue/throat swelling, SOB or lightheadedness with hypotension: No Has patient had a PCN reaction causing severe rash involving mucus membranes or skin necrosis: No Has patient had a PCN reaction that required hospitalization No Has patient had a PCN reaction occurring within the last 10 years: No If all of the above answers are "NO", then may proceed with Cephalosporin use.      Medication List    TAKE these medications   apixaban 2.5 MG Tabs tablet Commonly known as:  ELIQUIS Take 1 tablet (2.5 mg total) by mouth 2 (two) times daily.   atorvastatin 40 MG tablet Commonly known as:  LIPITOR Take 1 tablet (40 mg total) by mouth daily at 6 PM.   B-COMPLEX/B-12 PO Take 1 tablet by mouth daily.   celecoxib 200 MG capsule Commonly known as:  CELEBREX Take 200 mg by mouth daily as needed for mild pain or moderate pain.   co-enzyme Q-10 30 MG capsule Take 30 mg by mouth 3 (three) times daily.   COLCRYS 0.6 MG tablet Generic drug:  colchicine Take 0.6 mg by mouth as needed.   doxycycline 100 MG tablet Commonly known as:   VIBRA-TABS Take 100 mg by mouth 2 (two) times daily.   esomeprazole 40 MG capsule Commonly known as:  NEXIUM Take 40 mg by mouth daily as needed.   febuxostat 40 MG tablet Commonly known as:  ULORIC Take 10 mg by mouth daily. TAKES 0.25 TABLET  DAILY   Fish Oil 1000 MG Caps Take 1,000 mg by mouth daily.   Garlic 10 MG Caps Take 10 mg by mouth daily.   hydrALAZINE 25 MG tablet Commonly known as:  APRESOLINE Take 1 tablet (25 mg total) by mouth 3 (three) times daily as needed. What changed:  additional instructions   lisinopril 10 MG tablet Commonly known as:  PRINIVIL,ZESTRIL Take 1 tablet (10 mg total) by mouth daily.   Magnesium Oxide 250 MG Tabs Take 250 mg by mouth daily.   MULTIPLE VITAMIN PO Take 1 tablet by mouth daily.   OVER THE COUNTER MEDICATION Take 1 tablet by mouth 2 (two) times daily. Supplement for Immune Support for Lyme Disease DIM   OVER THE COUNTER MEDICATION Take 5 mLs by mouth 3 (three) times daily. Lauracidine Immune Support for Lyme Disease   OVER THE COUNTER MEDICATION Take 1 tablet by mouth daily. Betaglucin   PRESERVISION AREDS PO Take 1 tablet by mouth daily.   Probiotic 250 MG Caps Take 1 tablet by mouth See admin instructions. TAKES 3 HOURS AFTER DOXYCYCLINE   SOOTHE OP Place 1 drop into both eyes at bedtime.   TESTOSTERONE CYPIONATE IM Inject 1 mL into the muscle every 14 (fourteen) days. Last dose was at the beginning of the month of February   TURMERIC PO Take 1 tablet by mouth 2 (two) times daily.   vitamin E 400 UNIT capsule Take 400 Units by mouth daily.   VOLTAREN 1 % Gel Generic drug:  diclofenac sodium Place 2 g onto the skin  daily as needed (PAIN).   ZINC PO Take 1 tablet by mouth daily.       Follow-up Information    SETHI,PRAMOD, MD Follow up in 6 week(s).   Specialties:  Neurology, Radiology Why:  stroke cliniic. office will call with appt date and time. Contact information: [address] [place] [address] [phone]        [name], MD. Schedule an appointment as soon as possible for a visit in 1 week(s).   Specialty:  Family Medicine Contact information: [address] [place] [address] [phone]           Major procedures and Radiology Reports - PLEASE review detailed and final reports thoroughly  -         Dg Chest 2 View  Result Date: 06/20/2016 CLINICAL DATA:  Status post permanent pacemaker placement. History of heart block EXAM: CHEST  2 VIEW COMPARISON:  Chest x-ray dated November 05, 2010 FINDINGS: The lungs remain mildly hyperinflated. There is no postprocedure pneumothorax or hemothorax. The heart and pulmonary vascularity are normal. The ICD is in reasonable position radiographically with the generator in the left pectoral region. There is multilevel degenerative disc disease of the thoracic spine. IMPRESSION: There is no postprocedure complication following permanent pacemaker placement. Electronically Signed   By: David  Martinique M.D.   On: 06/20/2016 07:23   Ct Head Wo Contrast  Result Date: 06/27/2016 CLINICAL DATA:  TIA EXAM: CT HEAD WITHOUT CONTRAST TECHNIQUE: Contiguous axial images were obtained from the base of the skull through the vertex without intravenous contrast. COMPARISON:  CT head 06/25/2016 FINDINGS: Brain: Mild atrophy. Negative for acute infarct. Negative for hemorrhage or mass lesion. No change from the prior study. No fluid collection or shift of the midline structures. Vascular: No hyperdense vessel or unexpected calcification. Skull: Negative Sinuses/Orbits: Negative Other: None IMPRESSION: No acute abnormality and no change from the prior CT. Electronically Signed   By: Franchot Gallo M.D.   On: 06/27/2016 08:17   Ct Head Wo Contrast  Result Date: 06/14/2016 CLINICAL DATA:  Dizziness.  Headache and extremity weakness. EXAM: CT HEAD WITHOUT CONTRAST TECHNIQUE: Contiguous axial images were obtained  from the base of the skull through the vertex without intravenous contrast. COMPARISON:  None. FINDINGS: Brain: No subdural, epidural, or subarachnoid hemorrhage. Cerebellum, brainstem, and basal cisterns are normal. Ventricles and sulci are unremarkable for age. Mild scattered white matter changes. No acute cortical ischemia or infarct. No mass, mass effect, or midline shift. Vascular: Calcified atherosclerosis in the intracranial carotid arteries. Skull: Normal. Negative for fracture or focal lesion. Sinuses/Orbits: No acute finding. Other: None. IMPRESSION: No acute intracranial abnormality. Findings called to Dr. Clearnce Hasten Electronically Signed   By: Dorise Bullion III M.D   On: 06/14/2016 13:53   Mr Jodene Nam Head Wo Contrast  Result Date: 06/15/2016 CLINICAL DATA:  History of mini stroke years ago, numbness LEFT arm and face for 12 hours has now resolved. Stroke risk factors include hypertension and hyperlipidemia. EXAM: MRI HEAD WITHOUT CONTRAST MRA HEAD WITHOUT CONTRAST TECHNIQUE: Multiplanar, multiecho pulse sequences of the brain and surrounding structures were obtained without intravenous contrast. Angiographic images of the head were obtained using MRA technique without contrast. COMPARISON:  CT head 06/14/2016. FINDINGS: MRI HEAD FINDINGS Brain: No evidence for acute infarction, hemorrhage, mass lesion, hydrocephalus, or extra-axial fluid. Mild atrophy. Minor white matter disease, nonspecific. Vascular: Flow voids are maintained throughout the carotid, basilar, and vertebral arteries. There are no areas  of chronic hemorrhage. Skull and upper cervical spine: Unremarkable visualized calvarium, skullbase, and cervical vertebrae. Pituitary, pineal, cerebellar tonsils unremarkable. No upper cervical cord lesions. Sinuses/Orbits: Negative. Other: None. MRA HEAD FINDINGS The internal carotid arteries are widely patent. The basilar artery is widely patent. Artifactual signal loss at the LEFT vertebral basilar  confluence. LEFT vertebral is the sole contributor to the basilar with the RIGHT vertebral ending in PICA. No flow-limiting proximal intracranial stenosis or visible saccular aneurysm. IMPRESSION: Mild atrophy and small vessel disease. No acute intracranial findings. No proximal intracranial flow limiting stenosis. LEFT vertebral sole contributor to the basilar. Electronically Signed   By: Staci Righter M.D.   On: 06/15/2016 11:33   Mr Brain Wo Contrast  Result Date: 06/15/2016 CLINICAL DATA:  History of mini stroke years ago, numbness LEFT arm and face for 12 hours has now resolved. Stroke risk factors include hypertension and hyperlipidemia. EXAM: MRI HEAD WITHOUT CONTRAST MRA HEAD WITHOUT CONTRAST TECHNIQUE: Multiplanar, multiecho pulse sequences of the brain and surrounding structures were obtained without intravenous contrast. Angiographic images of the head were obtained using MRA technique without contrast. COMPARISON:  CT head 06/14/2016. FINDINGS: MRI HEAD FINDINGS Brain: No evidence for acute infarction, hemorrhage, mass lesion, hydrocephalus, or extra-axial fluid. Mild atrophy. Minor white matter disease, nonspecific. Vascular: Flow voids are maintained throughout the carotid, basilar, and vertebral arteries. There are no areas of chronic hemorrhage. Skull and upper cervical spine: Unremarkable visualized calvarium, skullbase, and cervical vertebrae. Pituitary, pineal, cerebellar tonsils unremarkable. No upper cervical cord lesions. Sinuses/Orbits: Negative. Other: None. MRA HEAD FINDINGS The internal carotid arteries are widely patent. The basilar artery is widely patent. Artifactual signal loss at the LEFT vertebral basilar confluence. LEFT vertebral is the sole contributor to the basilar with the RIGHT vertebral ending in PICA. No flow-limiting proximal intracranial stenosis or visible saccular aneurysm. IMPRESSION: Mild atrophy and small vessel disease. No acute intracranial findings. No proximal  intracranial flow limiting stenosis. LEFT vertebral sole contributor to the basilar. Electronically Signed   By: Staci Righter M.D.   On: 06/15/2016 11:33   US Carotid Bilateral  Result Date: 06/15/2016 CLINICAL DATA:  TIA. Left arm numbness. Hypertension, syncope, hyperlipidemia. EXAM: BILATERAL CAROTID DUPLEX ULTRASOUND TECHNIQUE: Pearline Cables scale imaging, color Doppler and duplex ultrasound was performed of bilateral carotid and vertebral arteries in the neck. COMPARISON:  None. TECHNIQUE: Quantification of carotid stenosis is based on velocity parameters that correlate the residual internal carotid diameter with NASCET-based stenosis levels, using the diameter of the distal internal carotid lumen as the denominator for stenosis measurement. The following velocity measurements were obtained: PEAK SYSTOLIC/END DIASTOLIC RIGHT ICA:                     112/13cm/sec CCA:                     [id number] SYSTOLIC ICA/CCA RATIO:  0.8 DIASTOLIC ICA/CCA RATIO: 0.8 ECA:                     145cm/sec LEFT ICA:                     79/13cm/sec CCA:                     [id number] SYSTOLIC ICA/CCA RATIO:  [id number] DIASTOLIC ICA/CCA RATIO: [id number] ECA:                     131cm/sec FINDINGS:  RIGHT CAROTID ARTERY: Intimal thickening through the common carotid artery. Eccentric partially calcified plaque in the bulb. No high-grade stenosis. Normal waveforms and color Doppler signal. Distal ICA mildly tortuous. RIGHT VERTEBRAL ARTERY:  Normal flow direction and waveform. LEFT CAROTID ARTERY: Minimal plaque in the carotid bulb. No high-grade stenosis. Normal waveforms and color Doppler signal. LEFT VERTEBRAL ARTERY: Normal flow direction and waveform. IMPRESSION: 1. Bilateral carotid bifurcation plaque right greater than left, resulting in less than 50% diameter stenosis. 2.  Antegrade bilateral vertebral arterial flow. Electronically Signed   By: Lucrezia Europe M.D.   On: 06/15/2016 10:38   Ct Head Code Stroke W/o Cm  Result Date:  06/25/2016 CLINICAL DATA:  Code stroke.  LEFT-sided weakness.  Pacemaker. EXAM: CT HEAD WITHOUT CONTRAST TECHNIQUE: Contiguous axial images were obtained from the base of the skull through the vertex without intravenous contrast. COMPARISON:  MR head 06/15/2016.  CT head 06/14/2016. FINDINGS: Brain: No evidence of acute infarction, hemorrhage, hydrocephalus, extra-axial collection or mass lesion/mass effect. Normal for age cerebral volume. Hypoattenuation of white matter of a mild nature favored to represent chronic microvascular ischemic change. Vascular: No hyperdense vessel or unexpected calcification. Atheromatous change in the cavernous carotid arteries, stable. Skull: Normal. Negative for fracture or focal lesion. Sinuses/Orbits: No acute finding. Other: None. ASPECTS [facility] Stroke Program Early CT Score) - Ganglionic level infarction (caudate, lentiform nuclei, internal capsule, insula, M1-M3 cortex): 7 - Supraganglionic infarction (M4-M6 cortex): 3 Total score (0-10 with 10 being normal): 10 Compared with prior imaging, no significant change. IMPRESSION: 1. Atrophy and small vessel disease similar to prior MR. No acute intracranial findings. 2. ASPECTS is 10. These results were called by telephone at the time of interpretation on 06/25/2016 at 11:56 am to Dr. Nance Pear , who verbally acknowledged these results. Electronically Signed   By: Staci Righter M.D.   On: 06/25/2016 11:57    Micro Results     Recent Results (from the past 240 hour(s))  Surgical pcr screen     Status: None   Collection Time: 06/19/16  1:37 PM  Result Value Ref Range Status   MRSA, PCR NEGATIVE NEGATIVE Final   Staphylococcus aureus NEGATIVE NEGATIVE Final    Comment:        The Xpert SA Assay (FDA approved for NASAL specimens in patients over 66 years of age), is one component of a comprehensive surveillance program.  Test performance has been validated by [facility] for patients greater than or equal to 30  year old. It is not intended to diagnose infection nor to guide or monitor treatment.     Today   Subjective    Charyl Dancer today has no headache, no chest abdominal pain,no new weakness tingling or numbness, feels much better wants to go home today.   Objective   Blood pressure 135/79, pulse 69, temperature 97.5 F (36.4 C), temperature source Oral, resp. rate 16, height 5\' 7"  (1.702 m), weight 75.8 kg (167 lb 1.7 oz), SpO2 98 %.  No intake or output data in the 24 hours ending 06/27/16 1538  Exam Awake Alert, Oriented x 3, No new F.N deficits, Normal affect [place].AT,PERRAL Supple Neck,No JVD, No cervical lymphadenopathy appriciated.  Symmetrical Chest wall movement, Good air movement bilaterally, CTAB RRR,No Gallops,Rubs or new Murmurs, No Parasternal Heave +ve B.Sounds, Abd Soft, Non tender, No organomegaly appriciated, No rebound -guarding or rigidity. No Cyanosis, Clubbing or edema, No new Rash or bruise   Data Review   CBC w Diff:  Lab Results  Component Value Date   WBC 7.7 06/26/2016   HGB 15.2 06/26/2016   HCT 45.6 06/26/2016   PLT 136 (L) 06/26/2016   LYMPHOPCT 26 06/25/2016   MONOPCT 16 06/25/2016   EOSPCT 3 06/25/2016   BASOPCT 1 06/25/2016    CMP:  Lab Results  Component Value Date   NA 137 06/26/2016   NA 139 08/04/2015   NA 138 06/26/2012   K 4.3 06/26/2016   K 4.4 06/26/2012   CL 103 06/26/2016   CL 105 06/26/2012   CO2 28 06/26/2016   CO2 29 06/26/2012   BUN 16 06/26/2016   BUN 22 (A) 08/04/2015   BUN 16 06/26/2012   CREATININE 1.46 (H) 06/26/2016   CREATININE 1.31 (H) 06/26/2012   GLU 95 08/04/2015   PROT 7.1 06/25/2016   PROT 7.5 06/26/2012   ALBUMIN 4.1 06/25/2016   ALBUMIN 3.8 06/26/2012   BILITOT 0.6 06/25/2016   BILITOT 0.6 06/26/2012   ALKPHOS 83 06/25/2016   ALKPHOS 68 06/26/2012   AST 35 06/25/2016   AST 23 06/26/2012   ALT 26 06/25/2016   ALT 24 06/26/2012  .   Total Time in preparing paper work, data evaluation and  todays exam - 35 minutes  Thurnell Lose M.D on 06/27/2016 at 3:38 PM  [facility]   Office  [phone]

## 2016-07-01 ENCOUNTER — Ambulatory Visit: Payer: Self-pay | Admitting: Family Medicine

## 2016-07-04 ENCOUNTER — Ambulatory Visit (INDEPENDENT_AMBULATORY_CARE_PROVIDER_SITE_OTHER): Payer: Medicare Other | Admitting: *Deleted

## 2016-07-04 DIAGNOSIS — I442 Atrioventricular block, complete: Secondary | ICD-10-CM | POA: Diagnosis not present

## 2016-07-04 LAB — CUP PACEART INCLINIC DEVICE CHECK
Battery Remaining Longevity: 152 mo
Brady Statistic AP VS Percent: 0.04 %
Brady Statistic AS VS Percent: 0.56 %
Brady Statistic RV Percent Paced: 95.58 %
Implantable Lead Implant Date: 20180214
Implantable Lead Model: 5076
Implantable Lead Model: 5076
Lead Channel Impedance Value: 456 Ohm
Lead Channel Impedance Value: 589 Ohm
Lead Channel Impedance Value: 627 Ohm
Lead Channel Pacing Threshold Amplitude: 0.75 V
Lead Channel Pacing Threshold Pulse Width: 0.4 ms
Lead Channel Sensing Intrinsic Amplitude: 15.875 mV
Lead Channel Sensing Intrinsic Amplitude: 2.625 mV
Lead Channel Setting Pacing Amplitude: 3.5 V
MDC IDC LEAD IMPLANT DT: 20180214
MDC IDC LEAD LOCATION: 753859
MDC IDC LEAD LOCATION: 753860
MDC IDC MSMT BATTERY VOLTAGE: 3.22 V
MDC IDC MSMT LEADCHNL RV IMPEDANCE VALUE: 475 Ohm
MDC IDC MSMT LEADCHNL RV PACING THRESHOLD AMPLITUDE: 0.75 V
MDC IDC MSMT LEADCHNL RV PACING THRESHOLD PULSEWIDTH: 0.4 ms
MDC IDC PG IMPLANT DT: 20180214
MDC IDC SESS DTM: 20180301163617
MDC IDC SET LEADCHNL RV PACING AMPLITUDE: 3.5 V
MDC IDC SET LEADCHNL RV PACING PULSEWIDTH: 0.4 ms
MDC IDC SET LEADCHNL RV SENSING SENSITIVITY: 0.9 mV
MDC IDC STAT BRADY AP VP PERCENT: 5.02 %
MDC IDC STAT BRADY AS VP PERCENT: 94.17 %
MDC IDC STAT BRADY RA PERCENT PACED: 2.83 %

## 2016-07-04 NOTE — Progress Notes (Signed)
Wound check appointment. Steri-strips removed. Wound without redness or edema. Incision edges approximated, wound well healed. Normal device function. Thresholds, sensing, and impedances consistent with implant measurements. Device programmed at 3.5V programmed on for extra safety margin until 3 month visit. Histogram distribution appropriate for patient and level of activity. 107 mode switches (48.6%)+Eliquis, max duration 12 hours 50 minutes, EGM appears AF. No high ventricular rates noted. Patient educated about wound care, arm mobility, lifting restrictions. ROV in 3 months with SK.

## 2016-07-08 ENCOUNTER — Ambulatory Visit
Admission: RE | Admit: 2016-07-08 | Discharge: 2016-07-08 | Disposition: A | Discharge status: Home / Self Care | Payer: Medicare Other | Source: Ambulatory Visit | Attending: Family Medicine | Admitting: Family Medicine

## 2016-07-08 ENCOUNTER — Ambulatory Visit (INDEPENDENT_AMBULATORY_CARE_PROVIDER_SITE_OTHER): Payer: Medicare Other | Admitting: Family Medicine

## 2016-07-08 VITALS — BP 136/84 | HR 64 | Temp 97.4°F | Resp 16 | Wt 170.0 lb

## 2016-07-08 DIAGNOSIS — R5383 Other fatigue: Secondary | ICD-10-CM | POA: Insufficient documentation

## 2016-07-08 DIAGNOSIS — Z950 Presence of cardiac pacemaker: Secondary | ICD-10-CM | POA: Diagnosis not present

## 2016-07-08 DIAGNOSIS — I639 Cerebral infarction, unspecified: Secondary | ICD-10-CM

## 2016-07-08 DIAGNOSIS — I442 Atrioventricular block, complete: Secondary | ICD-10-CM

## 2016-07-08 DIAGNOSIS — Z09 Encounter for follow-up examination after completed treatment for conditions other than malignant neoplasm: Secondary | ICD-10-CM

## 2016-07-08 DIAGNOSIS — E7849 Other hyperlipidemia: Secondary | ICD-10-CM

## 2016-07-08 DIAGNOSIS — E784 Other hyperlipidemia: Secondary | ICD-10-CM

## 2016-07-08 DIAGNOSIS — R739 Hyperglycemia, unspecified: Secondary | ICD-10-CM

## 2016-07-08 NOTE — Progress Notes (Signed)
[name]  MRN: [id number] DOB: [birth year]  Subjective:  HPI  Patient is here for hospital follow up. 1st hospital stay was 2/9-2/10 Diagnoses were: TIA, Complete heart block, lethargy, occlusion of vertebral artery.  MRI, MRA and Echo was done. Neurology consult was done. Medications were stopped: Lasix, Propranolol, Rapaflo, Testosterone gel and Testosterone injections. Lipitor was increased to 40 mg daily.  On 06/19/16 underwent implantation of a MDT dual chamber pacemaker. B/P was elevated and Lisinopril was increased to 10 mg daily. 2nd hospital stay was 2/20-2/22 Patient went to the hospital due to left sided weakness and slurred speech. Discharge DX was Hemiparesis-could not able to get MRI done at that time due to he has pacemaker just put in and have to wait 6 months. Patient was observed, thought per neurologist this was possibly small TIA. Discharge instructions were to follow up with neurologist, consider sleep study. To check CBC, CMP and Chest Xray through PCP on follow up. Patient has seen neurologist. Patient is having left hand numbness still present. Patient is checking his b/p and readings about 130s-60s usually. BP Readings from Last 3 Encounters:  07/08/16 136/84  06/27/16 135/79  06/25/16 (!) 179/83   Patient is seen a lyme disease specialist and is currently on Doxycycline.  Patient Active Problem List   Diagnosis Date Noted  . Snores 06/27/2016  . Cardiac pacemaker in situ 06/25/2016  . Hemiparesis ([place]) 06/25/2016  . Acute ischemic stroke ([place])   . Acute left hemiparesis ([place])   . Hemisensory loss   . Dysarthria   . Stroke-like symptoms   . Complete heart block ([place])   . Pain in thoracic spine   . Orthostasis   . Lethargy   . Occlusion of vertebral artery   . TIA (transient ischemic attack) 06/14/2016  . Second degree AV block, Mobitz type I 02/28/2015  . Arthritis 02/06/2015  . Esophageal reflux 02/06/2015  . Arthritis urica 02/06/2015  .  Cannot sleep 02/06/2015  . Arthritis, degenerative 02/06/2015  . Adenocarcinoma, renal cell ([place]) 02/06/2015  . Benign prostatic hyperplasia with urinary obstruction 11/21/2014  . Personal history of other malignant neoplasm of kidney 11/21/2014  . Palpitations 12/31/2013  . Hyperlipidemia 11/02/2010  . HYPERTENSION, BENIGN 04/16/2010    Past Medical History:  Diagnosis Date  . Aortic valve disorders   . Arthritis   . Atrial flutter ([place]) 06/18/2016   "AF or AFl; not sure which" (06/23/2016)  . Basal cell carcinoma    "face, nose left shoulder, left arm" (06/19/2016)  . BBB (bundle branch block)    hx right  . Chronic back pain    "neck, thoracic, lower back" (06/19/2016)  . Complete heart block ([place]) 06/2016  . GERD (gastroesophageal reflux disease)   . Heart block    "I've had type I, II [place] before now" (06/19/2016)  . History of gout   . History of hiatal hernia    "self dx'd" (06/19/2016)  . Hyperlipidemia   . Hypertension   . Lyme disease    "dx'd by me 2003; cx's showed dx 08/2015"  . Migraine    "3-4/year" (06/19/2016)  . Presence of permanent cardiac pacemaker 06/19/2016  . PVC's (premature ventricular contractions)   . Renal cancer, left ([place]) 2006   S/P cryotherapy  . Spinal stenosis    "cervical, 1 thoracic, lumbar" (06/19/2016)  . Squamous carcinoma    "face, nose left shoulder, left arm" (06/19/2016)  . TIA (transient ischemic attack) 06/14/2016   "I'm not sure  that's what it was" (06/25/2016)    Social History   Social History  . Marital status: Married    Spouse name: N/A  . Number of children: N/A  . Years of education: N/A   Occupational History  . Not on file.   Social History Main Topics  . Smoking status: Never Smoker  . Smokeless tobacco: Never Used  . Alcohol use 1.2 oz/week    2 Glasses of wine per week  . Drug use: No  . Sexual activity: Yes   Other Topics Concern  . Not on file   Social History Narrative   Independent at baseline.  Lives with family    Outpatient Encounter Prescriptions as of 07/08/2016  Medication Sig Note  . apixaban (ELIQUIS) 2.5 MG TABS tablet Take 1 tablet (2.5 mg total) by mouth 2 (two) times daily.   Marland Kitchen atorvastatin (LIPITOR) 40 MG tablet Take 1 tablet (40 mg total) by mouth daily at 6 PM.   . B Complex Vitamins (B-COMPLEX/B-12 PO) Take 1 tablet by mouth daily.   . celecoxib (CELEBREX) 200 MG capsule Take 200 mg by mouth daily as needed for mild pain or moderate pain.   Marland Kitchen co-enzyme Q-10 30 MG capsule Take 30 mg by mouth 3 (three) times daily.   Marland Kitchen COLCRYS 0.6 MG tablet Take 0.6 mg by mouth as needed.    . diclofenac sodium (VOLTAREN) 1 % GEL Place 2 g onto the skin daily as needed (PAIN).    Marland Kitchen doxycycline (VIBRA-TABS) 100 MG tablet Take 100 mg by mouth 2 (two) times daily.   Marland Kitchen esomeprazole (NEXIUM) 40 MG capsule Take 40 mg by mouth daily as needed.    . febuxostat (ULORIC) 40 MG tablet Take 10 mg by mouth daily. TAKES 0.25 TABLET  DAILY   . Garlic 10 MG CAPS Take 10 mg by mouth daily.    . hydrALAZINE (APRESOLINE) 25 MG tablet Take 1 tablet (25 mg total) by mouth 3 (three) times daily as needed. (Patient taking differently: Take 25 mg by mouth 3 (three) times daily as needed. ONLY FOR HIGH BP READINGS) 06/14/2016: Only needs it once about every 3 months  . lisinopril (PRINIVIL,ZESTRIL) 10 MG tablet Take 1 tablet (10 mg total) by mouth daily.   . Magnesium Oxide 250 MG TABS Take 250 mg by mouth daily.   . MULTIPLE VITAMIN PO Take 1 tablet by mouth daily.    . Multiple Vitamins-Minerals (PRESERVISION AREDS PO) Take 1 tablet by mouth daily.   . Multiple Vitamins-Minerals (ZINC PO) Take 1 tablet by mouth daily.   . Omega-3 Fatty Acids (FISH OIL) 1000 MG CAPS Take 1,000 mg by mouth daily.    Marland Kitchen OVER THE COUNTER MEDICATION Take 1 tablet by mouth 2 (two) times daily. Supplement for Immune Support for Lyme Disease DIM   . OVER THE COUNTER MEDICATION Take 5 mLs by mouth 3 (three) times daily.  Lauracidine Immune Support for Lyme Disease   . OVER THE COUNTER MEDICATION Take 1 tablet by mouth daily. Betaglucin   . propranolol (INDERAL) 20 MG tablet Take 20 mg by mouth at bedtime as needed.   Marland Kitchen Propylene Glycol-Glycerin (SOOTHE OP) Place 1 drop into both eyes at bedtime.   . Saccharomyces boulardii (PROBIOTIC) 250 MG CAPS Take 1 tablet by mouth See admin instructions. TAKES 3 HOURS AFTER DOXYCYCLINE   . terazosin (HYTRIN) 1 MG capsule Take 1 mg by mouth at bedtime.   . TESTOSTERONE CYPIONATE IM Inject 1 mL into the  muscle every 14 (fourteen) days. Last dose was at the beginning of the month of February   . TURMERIC PO Take 1 tablet by mouth 2 (two) times daily.   . vitamin E 400 UNIT capsule Take 400 Units by mouth daily.    No facility-administered encounter medications on file as of 07/08/2016.     Allergies  Allergen Reactions  . Iodine Hives    IVP contrast  . Penicillins Itching    ITCHY FEELING IN FINGERS Has patient had a PCN reaction causing immediate rash, facial/tongue/throat swelling, SOB or lightheadedness with hypotension: No Has patient had a PCN reaction causing severe rash involving mucus membranes or skin necrosis: No Has patient had a PCN reaction that required hospitalization No Has patient had a PCN reaction occurring within the last 10 years: No If all of the above answers are "NO", then may proceed with Cephalosporin use.     Review of Systems  Constitutional: Positive for malaise/fatigue.       Drowsy feeling  Eyes: Negative.   Respiratory: Negative.   Cardiovascular: Negative.        Chest discomfort since pacemaker implantation.  Gastrointestinal: Negative.   Neurological: Positive for tingling.       Left hand numbness  Endo/Heme/Allergies: Negative.   Psychiatric/Behavioral: Negative.     Objective:  BP 136/84   Pulse 64   Temp 97.4 F (36.3 C)   Resp 16   Wt 170 lb (77.1 kg)   SpO2 94%   BMI 26.63 kg/m   Physical Exam   Constitutional: He is oriented to person, place, and time and well-developed, well-nourished, and in no distress.  HENT:  Head: Normocephalic and atraumatic.  Right Ear: External ear normal.  Left Ear: External ear normal.  Nose: Nose normal.  Eyes: Conjunctivae are normal. No scleral icterus.  Neck: No thyromegaly present.  Cardiovascular: Normal rate, regular rhythm and normal heart sounds.   Pulmonary/Chest: Effort normal and breath sounds normal.  Abdominal: Soft.  Neurological: He is alert and oriented to person, place, and time. Gait normal. GCS score is 15.  Skin: Skin is warm and dry.  Psychiatric: Mood, memory, affect and judgment normal.    Assessment and Plan :  1. Hospital discharge follow-up Pt would like to make sure he has no ischemic disease . - Ambulatory referral to Cardiology  2. Complete heart block (HCC)  - CBC w/Diff/Platelet - Comprehensive metabolic panel - DG Chest 2 View; Future - Ambulatory referral to Cardiology  3. Acute ischemic stroke (HCC)  - CBC w/Diff/Platelet - Comprehensive metabolic panel - DG Chest 2 View; Future - Ambulatory referral to Cardiology  4. Other fatigue  - CBC w/Diff/Platelet - Comprehensive metabolic panel - DG Chest 2 View; Future - HgB A1c  5. Hyperglycemia  - TSH  6. Other hyperlipidemia - Lipid Panel With LDL/HDL Ratio I have done the exam and reviewed the chart and it is accurate to the best of my knowledge. Development worker, community has been used and  any errors in dictation or transcription are unintentional. Miguel Aschoff M.D. [facility]

## 2016-07-12 ENCOUNTER — Telehealth: Payer: Self-pay | Admitting: Family Medicine

## 2016-07-12 LAB — LIPID PANEL WITH LDL/HDL RATIO
Cholesterol, Total: 119 mg/dL (ref 100–199)
HDL: 53 mg/dL (ref >39)
LDL Calculated: 58 mg/dL (ref 0–99)
LDl/HDL Ratio: 1.1 ratio units (ref 0.0–3.6)
Triglycerides: 39 mg/dL (ref 0–149)
VLDL CHOLESTEROL CAL: 8 mg/dL (ref 5–40)

## 2016-07-12 LAB — HEMOGLOBIN A1C
Est. average glucose Bld gHb Est-mCnc: 114 mg/dL
HEMOGLOBIN A1C: 5.6 % (ref 4.8–5.6)

## 2016-07-12 LAB — TSH: TSH: 2.23 u[IU]/mL (ref 0.450–4.500)

## 2016-07-12 NOTE — Telephone Encounter (Signed)
Called Pt to schedule AWV with [facility] - knb °

## 2016-07-15 ENCOUNTER — Telehealth: Payer: Self-pay

## 2016-07-15 NOTE — Telephone Encounter (Signed)
Pt's wife advised.   Thanks,   -Jennye Runquist  

## 2016-07-15 NOTE — Telephone Encounter (Signed)
-----   Message from Jerrol Banana., MD sent at 07/15/2016  9:23 AM EDT ----- Labs OK.

## 2016-07-22 NOTE — Progress Notes (Signed)
Cardiology Office Note  Date:  07/23/2016   ID:  [name], DOB 08/14/1932, MRN [id number]  PCP:  Thomas Durie, MD   Chief Complaint  Patient presents with  . other    6 month f/u.   Dr. Rosanna [name] recommends stress test.  S/p pacer implant 06/09/16. Currently being treated for Lyme disease.  Medications reviewed verbally.     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 Sees a specialist at [name] outside the system  Recent admission to hospital with SOB, found to be in heart block 06/2016 permanent pacemaker implantation (06/19/2016),   presented to the [name] with c/o left sided numbness weakness and slurred speech. 06/25/2016  intermittent symptoms of left facial and LUE numbness since his initial presentation.  Reports having initial symptoms followed by recurrent symptoms 5 days later with the same location. Seen by neurology in the hospital  Since his discharge he is regaining strength in his left hand, still with some numbness in the palm of the left hand. Concerned about restarting his treadmill exercise program He has noticed increased heart rate in the appropriate fashion with exertion.  He is on eliquis 2.5 mg twice a day, most recent creatinine 1.77, age 83 years old Prior creatinine 1.46  He does not appreciate when he has arrhythmia such as atrial fibrillation/flutter was previously seen in the office and was asymptomatic, it was identified on EKG  He reports that since  the pacemaker was placed, he has not had any labile blood pressure as seen previously  EKG personally reviewed by myself on today's visit shows AV paced rhythm rate 73 bpm  Lab work reviewed with him in detail showing total cholesterol 119, LDL 58, TSH 2.2, A1c 5.6, creatinine 1.5 to  PMH:   has a past medical history of Aortic valve disorders; Arthritis; Atrial flutter ([name]) (06/18/2016); Basal cell carcinoma; BBB (bundle branch block); Chronic back pain; Complete heart block (HCC) (06/2016); GERD (gastroesophageal reflux disease); Heart block; History of gout; History of hiatal hernia; Hyperlipidemia; Hypertension; Lyme disease; Migraine; Presence of permanent cardiac pacemaker (06/19/2016); PVC's (premature ventricular contractions); Renal cancer, left ([place]) (2006); Spinal stenosis; Squamous carcinoma; and TIA (transient ischemic attack) (06/14/2016).  PSH:    Past Surgical History:  Procedure Laterality Date  . ANKLE FRACTURE SURGERY Right 1967  . BASAL CELL CARCINOMA EXCISION     "face, nose left shoulder, left arm"  . BIOPSY PROSTATE  2001 & 2003  . CARDIAC CATHETERIZATION  1990's  . FRACTURE SURGERY    . INGUINAL HERNIA REPAIR Left 2012  . INSERT / REPLACE / REMOVE PACEMAKER  06/19/2016  . LAPAROSCOPIC ABLATION RENAL MASS    . PACEMAKER IMPLANT N/A 06/19/2016   Procedure: Pacemaker Implant;  Surgeon: Thomas Sprang, MD;  Location: [facility];  Service: Cardiovascular;  Laterality: N/A;  . PROSTATE SURGERY    . SQUAMOUS CELL CARCINOMA EXCISION     "face, nose left shoulder, left arm"  . TONSILLECTOMY AND ADENOIDECTOMY      Current Outpatient Prescriptions  Medication Sig Dispense Refill  .  apixaban (ELIQUIS) 2.5 MG TABS tablet Take 1 tablet (2.5 mg total) by mouth 2 (two) times daily. 60 tablet 11  . atorvastatin (LIPITOR) 40 MG tablet Take 1 tablet (40 mg total) by mouth daily at 6 PM. 30 tablet 2  . B Complex Vitamins (B-COMPLEX/B-12 PO) Take 1 tablet by mouth daily.     Marland Kitchen co-enzyme Q-10 30 MG capsule Take 30 mg by mouth 3 (three) times daily.    Marland Kitchen COLCRYS 0.6 MG tablet Take 0.6 mg by mouth as needed.     . diclofenac sodium (VOLTAREN) 1 % GEL Place 2 g onto the skin daily as needed (PAIN).     Marland Kitchen esomeprazole (NEXIUM) 40 MG capsule Take 40 mg by mouth daily as needed.     . febuxostat (ULORIC) 40 MG tablet Take 10 mg by mouth daily. TAKES 0.25 TABLET  DAILY    . Garlic 10 MG CAPS Take 10 mg by mouth daily.     . hydrALAZINE (APRESOLINE) 25 MG tablet Take 1 tablet (25 mg total) by mouth 3 (three) times daily as needed. (Patient taking differently: Take 25 mg by mouth 3 (three) times daily as needed. ONLY FOR HIGH BP READINGS) 90 tablet 6  . lisinopril (PRINIVIL,ZESTRIL) 10 MG tablet Take 1 tablet (10 mg total) by mouth daily. 30 tablet 1  . Magnesium Oxide 250 MG TABS Take 250 mg by mouth daily.    . MULTIPLE VITAMIN PO Take 1 tablet by mouth daily.     . Multiple Vitamins-Minerals (PRESERVISION AREDS PO) Take 1 tablet by mouth daily.    . Multiple Vitamins-Minerals (ZINC PO) Take 1 tablet by mouth daily.    . Omega-3 Fatty Acids (FISH OIL) 1000 MG CAPS Take 1,000 mg by mouth daily.     Marland Kitchen OVER THE COUNTER MEDICATION Take 1 tablet by mouth 2 (two) times daily. Supplement for Immune Support for Lyme Disease DIM    . OVER THE COUNTER MEDICATION Take 5 mLs by mouth 3 (three) times daily. Lauracidine Immune Support for Lyme Disease    . OVER THE COUNTER MEDICATION Take 1 tablet by mouth daily. Betaglucin    . propranolol (INDERAL) 20 MG tablet Take 20 mg by mouth at bedtime as needed.    Marland Kitchen Propylene Glycol-Glycerin (SOOTHE OP) Place 1 drop into both eyes at bedtime.    . Saccharomyces boulardii (PROBIOTIC) 250 MG CAPS Take 1 tablet by mouth See admin instructions. TAKES 3 HOURS AFTER DOXYCYCLINE    . terazosin (HYTRIN) 1 MG capsule Take 1 mg by mouth at bedtime.    . TESTOSTERONE CYPIONATE IM Inject 1 mL into the muscle every 14 (fourteen) days. Last dose was at  the beginning of the month of February    . TURMERIC PO Take 1 tablet by mouth 2 (two) times daily.    . vitamin E 400 UNIT capsule Take 400 Units by mouth daily.    Marland Kitchen apixaban (ELIQUIS) 5 MG TABS tablet Take 1 tablet (5 mg total) by mouth 2 (two) times daily. 60 tablet 6  . azithromycin (ZITHROMAX) 500 MG tablet     . celecoxib (CELEBREX) 200 MG capsule Take 200 mg by mouth daily as needed for mild pain or moderate pain.    Marland Kitchen doxycycline (VIBRA-TABS) 100 MG tablet Take 100 mg by mouth 2 (two) times daily.    . metroNIDAZOLE (FLAGYL) 500 MG tablet     . rifampin (RIFADIN) 300 MG capsule     . testosterone cypionate (DEPOTESTOSTERONE  CYPIONATE) 200 MG/ML injection     . traMADol (ULTRAM) 50 MG tablet      No current facility-administered medications for this visit.      Allergies:   Iodine and Penicillins   Social History:  The patient  reports that he has never smoked. He has never used smokeless tobacco. He reports that he drinks about 1.2 oz of alcohol per week . He reports that he does not use drugs.   Family History:   family history includes Aortic stenosis in his mother; Arthritis in his father; Heart attack in his brother.    Review of Systems: Review of Systems  Constitutional: Negative.   Respiratory: Negative.   Cardiovascular: Negative.   Gastrointestinal: Negative.   Musculoskeletal: Negative.   Neurological: Negative.   Psychiatric/Behavioral: Negative.   All other systems reviewed and are negative.    PHYSICAL EXAM: VS:  BP 128/78 (BP Location: Left Arm, Patient Position: Sitting, Cuff Size: Normal)   Pulse 73   Ht 5\' 7"  (1.702 m)   Wt 169 lb 8 oz (76.9 kg)   BMI 26.55 kg/m  , BMI Body mass index is 26.55 kg/m. GEN: Well nourished, well developed, in no acute distress  HEENT: normal  Neck: no JVD, carotid bruits, or masses Cardiac: RRR; no murmurs, rubs, or gallops,no edema  Respiratory:  clear to auscultation bilaterally, normal work of breathing GI:  soft, nontender, nondistended, + BS MS: no deformity or atrophy  Skin: warm and dry, no rash Neuro:  Strength and sensation are intact Psych: euthymic mood, full affect    Recent Labs: 06/25/2016: ALT 26 06/26/2016: BUN 16; Creatinine, Ser 1.46; Hemoglobin 15.2; Platelets 136; Potassium 4.3; Sodium 137 07/11/2016: TSH 2.230    Lipid Panel Lab Results  Component Value Date   CHOL 119 07/11/2016   HDL 53 07/11/2016   LDLCALC 58 07/11/2016   TRIG 39 07/11/2016      Wt Readings from Last 3 Encounters:  07/23/16 169 lb 8 oz (76.9 kg)  07/08/16 170 lb (77.1 kg)  06/25/16 167 lb 1.7 oz (75.8 kg)       ASSESSMENT AND PLAN:  HYPERTENSION, BENIGN - Plan: EKG 12-Lead Blood pressure is well controlled on today's visit. No changes made to the medications.  Other hyperlipidemia - Plan: EKG 12-Lead Cholesterol is at goal on the current lipid regimen. No changes to the medications were made.  Transient cerebral ischemia, unspecified type - Plan: EKG 12-Lead Recent stroke, etiology unclear Minimal carotid disease. Small vessel cerebral disease on MRI/ct Currently on anticoagulation  Cardiac pacemaker in situ - Plan: EKG 12-Lead Recent pacemaker placed by Dr. Michaele Offer well, no complaints, recovered Pacemaker check May 2018  Hemiparesis of left dominant side due to non-cerebrovascular etiology [facility]) Hospital records reviewed, has follow-up with neurology  next week  Complete heart block [name]) Pacemaker placed, denies any near syncope or syncope  Paroxysmal atrial fibrillation [name]) Long discussion concerning his atrial fibrillation.   Coronary artery disease involving native coronary artery of native heart without angina pectoris Currently with no symptoms of angina. No further workup at this time. Continue current medication regimen. We did spend some time discussing various testing techniques such as treadmill testing, stress echo, stress Myoview, CT coronary calcium  scoring. He has indicated he would like to start exercising without any testing at this time. He does not have any symptoms concerning for angina  Disposition:   F/U  6 months   Total encounter time more than 25 minutes  Greater than 50% was spent in counseling and coordination of care with the patient    Orders Placed This Encounter  Procedures  . EKG 12-Lead     Signed, Esmond Plants, M.D., Ph.D. 07/23/2016  [place], [place]

## 2016-07-23 ENCOUNTER — Encounter: Payer: Self-pay | Admitting: Cardiovascular Disease

## 2016-07-23 ENCOUNTER — Ambulatory Visit (INDEPENDENT_AMBULATORY_CARE_PROVIDER_SITE_OTHER): Payer: Medicare Other | Admitting: Cardiovascular Disease

## 2016-07-23 VITALS — BP 128/78 | HR 73 | Ht 67.0 in | Wt 169.5 lb

## 2016-07-23 DIAGNOSIS — I442 Atrioventricular block, complete: Secondary | ICD-10-CM | POA: Diagnosis not present

## 2016-07-23 DIAGNOSIS — G8192 Hemiplegia, unspecified affecting left dominant side: Secondary | ICD-10-CM | POA: Diagnosis not present

## 2016-07-23 DIAGNOSIS — Z950 Presence of cardiac pacemaker: Secondary | ICD-10-CM

## 2016-07-23 DIAGNOSIS — I10 Essential (primary) hypertension: Secondary | ICD-10-CM | POA: Diagnosis not present

## 2016-07-23 DIAGNOSIS — I480 Paroxysmal atrial fibrillation: Secondary | ICD-10-CM

## 2016-07-23 DIAGNOSIS — E7849 Other hyperlipidemia: Secondary | ICD-10-CM

## 2016-07-23 DIAGNOSIS — G459 Transient cerebral ischemic attack, unspecified: Secondary | ICD-10-CM

## 2016-07-23 DIAGNOSIS — E784 Other hyperlipidemia: Secondary | ICD-10-CM | POA: Diagnosis not present

## 2016-07-23 DIAGNOSIS — I2510 Atherosclerotic heart disease of native coronary artery without angina pectoris: Secondary | ICD-10-CM | POA: Diagnosis not present

## 2016-07-23 MED ORDER — APIXABAN 5 MG PO TABS: 5.0000 mg | ORAL_TABLET | Freq: Two times a day (BID) | ORAL | 6 refills | Status: DC

## 2016-07-23 NOTE — Patient Instructions (Signed)

## 2016-07-24 ENCOUNTER — Ambulatory Visit (INDEPENDENT_AMBULATORY_CARE_PROVIDER_SITE_OTHER): Payer: Medicare Other | Admitting: Neurology

## 2016-07-24 ENCOUNTER — Encounter: Payer: Self-pay | Admitting: Neurology

## 2016-07-24 VITALS — BP 112/60 | HR 72 | Resp 16 | Ht 67.0 in | Wt 170.0 lb

## 2016-07-24 DIAGNOSIS — I442 Atrioventricular block, complete: Secondary | ICD-10-CM

## 2016-07-24 DIAGNOSIS — R351 Nocturia: Secondary | ICD-10-CM | POA: Diagnosis not present

## 2016-07-24 DIAGNOSIS — Z950 Presence of cardiac pacemaker: Secondary | ICD-10-CM | POA: Diagnosis not present

## 2016-07-24 DIAGNOSIS — R0681 Apnea, not elsewhere classified: Secondary | ICD-10-CM | POA: Diagnosis not present

## 2016-07-24 DIAGNOSIS — I480 Paroxysmal atrial fibrillation: Secondary | ICD-10-CM

## 2016-07-24 DIAGNOSIS — G458 Other transient cerebral ischemic attacks and related syndromes: Secondary | ICD-10-CM | POA: Diagnosis not present

## 2016-07-24 DIAGNOSIS — R0683 Snoring: Secondary | ICD-10-CM

## 2016-07-24 DIAGNOSIS — G478 Other sleep disorders: Secondary | ICD-10-CM

## 2016-07-24 DIAGNOSIS — F515 Nightmare disorder: Secondary | ICD-10-CM

## 2016-07-24 NOTE — Progress Notes (Signed)
Subjective:    Patient ID: Thomas [name] is a 83 y.o. male.  HPI     Thomas Age, MD, PhD [facility] [address], [address]O. [place], [place] [address]  I saw patient, Thomas [name], as a referral from the hospital for initial consultation of his sleep disorder, in particular, concern for underlying obstructive sleep apnea. The patient is accompanied by his wife today. Dr. Collins [name] is an 11 year old right-handed gentleman, retired Dealer, who has an underlying complex medical history of heart disease, including heart block, for which he recently had a pacemaker placed on 06/19/2016, recent hospitalizations for left sided numbness and then again for left-sided numbness, weakness and slurring of speech, Hx of aortic valve disease, atrial flutter and A fib, chronic back and neck pain, reflux disease, chronic Lyme disease, hyperlipidemia, hypertension, kidney cancer in 2006, spinal stenosis, squamous cell carcinoma, basal cell cancer, chronic kidney disease, and overweight state, who reports snoring and excessive daytime somnolence, wife reports witnessed apneic pauses while asleep. He was in the hospital from 06/25/2016 through 06/27/2016 for suspected TIA, MRI could not be done due to recent pacemaker placement. He had a stroke workup including carotid ultrasound which showed no significant ICA stenosis, TTE early in the month of February showed EF of 55-60% and normal wall motion. Hemoglobin A1c was 5.5. He is scheduled in stroke clinic for follow-up with Dr. Leonie [name] next month. He reports snoring, nonrestorative sleep, daytime tiredness, trouble maintaining sleep. He occasionally takes a nap. His Epworth sleepiness score is 9 out of 24, fatigue score is 12 out of 63. He had a brain MRI wo contrast and MRA head wo contrast on 06/15/16: IMPRESSION: Mild atrophy and small vessel disease. No acute intracranial findings.   No proximal intracranial flow limiting stenosis.  LEFT vertebral sole contributor to the basilar. He reports difficulty with sleep for many years. For 10 years he had severe insomnia, this was after his first wife passed away, she was sick for a prolonged period of time, in a coma for about 3 months he reports. He has bad dreams, often wakes up after a nightmare and is upset for the first part of the morning, wife reports that he appears depressed at times but no sustained depression is reported. He does not sleep very well through the night, has nocturia about 2-3 times per hour tonight, denies morning headaches, has tried over-the-counter sleep aids without success, has tried clonazepam some 2 or 3 years ago but felt bad after trying it a few times, including daytime grogginess. He denies restless leg symptoms or leg twitching at night. Wife is not witnessed any dream enactments. He does talk in his sleep from time to time. He has 3 grown children from his first marriage, one daughter is an Administrator, Civil Service, his son is a Teacher, music, his other daughter is a Engineer, maintenance (IT). He has a Psychiatrist. He is a nonsmoker.  His Past Medical History Is Significant For: Past Medical History:  Diagnosis Date  . Aortic valve disorders   . Arthritis   . Atrial flutter ([place]) 06/18/2016   "AF or AFl; not sure which" (06/23/2016)  . Basal cell carcinoma    "face, nose left shoulder, left arm" (06/19/2016)  . BBB (bundle branch block)    hx right  . Chronic back pain    "neck, thoracic, lower back" (06/19/2016)  . Complete heart block ([place]) 06/2016  . GERD (gastroesophageal reflux disease)   . Heart block    "I've had  type I, II Wenke before now" (06/19/2016)  . History of gout   . History of hiatal hernia    "self dx'd" (06/19/2016)  . Hyperlipidemia   . Hypertension   . Lyme disease    "dx'd by me 2003; cx's showed dx 08/2015"  . Migraine    "3-4/year" (06/19/2016)  . Presence of permanent cardiac pacemaker 06/19/2016  . PVC's (premature ventricular contractions)   .  Renal cancer, left ([place]) 2006   S/P cryotherapy  . Spinal stenosis    "cervical, 1 thoracic, lumbar" (06/19/2016)  . Squamous carcinoma    "face, nose left shoulder, left arm" (06/19/2016)  . TIA (transient ischemic attack) 06/14/2016   "I'm not sure that's what it was" (06/25/2016)    His Past Surgical History Is Significant For: Past Surgical History:  Procedure Laterality Date  . ANKLE FRACTURE SURGERY Right 1967  . BASAL CELL CARCINOMA EXCISION     "face, nose left shoulder, left arm"  . BIOPSY PROSTATE  2001 & 2003  . CARDIAC CATHETERIZATION  1990's  . FRACTURE SURGERY    . INGUINAL HERNIA REPAIR Left 2012  . INSERT / REPLACE / REMOVE PACEMAKER  06/19/2016  . LAPAROSCOPIC ABLATION RENAL MASS    . PACEMAKER IMPLANT N/A 06/19/2016   Procedure: Pacemaker Implant;  Surgeon: Deboraha Sprang, MD;  Location: [facility];  Service: Cardiovascular;  Laterality: N/A;  . PROSTATE SURGERY    . SQUAMOUS CELL CARCINOMA EXCISION     "face, nose left shoulder, left arm"  . TONSILLECTOMY AND ADENOIDECTOMY      His Family History Is Significant For: Family History  Problem Relation [name] of Onset  . Heart attack Brother   . Aortic stenosis Mother   . Arthritis Father     His Social History Is Significant For: Social History   Social History  . Marital status: Married    Spouse name: N/A  . Number of children: 3  . Years of education: MD    Occupational History  . Retired     Social History Main Topics  . Smoking status: Never Smoker  . Smokeless tobacco: Never Used  . Alcohol use 1.2 oz/week    2 Glasses of wine per week  . Drug use: No  . Sexual activity: Yes   Other Topics Concern  . None   Social History Narrative   Independent at baseline. Lives with family   Drinks tea in the morning     His Allergies Are:  Allergies  Allergen Reactions  . Iodine Hives    IVP contrast  . Penicillins Itching    ITCHY FEELING IN FINGERS Has patient had a PCN reaction  causing immediate rash, facial/tongue/throat swelling, SOB or lightheadedness with hypotension: No Has patient had a PCN reaction causing severe rash involving mucus membranes or skin necrosis: No Has patient had a PCN reaction that required hospitalization No Has patient had a PCN reaction occurring within the last 10 years: No If all of the above answers are "NO", then may proceed with Cephalosporin use.   :   His Current Medications Are:  Outpatient Encounter Prescriptions as of 07/24/2016  Medication Sig  . apixaban (ELIQUIS) 2.5 MG TABS tablet Take 1 tablet (2.5 mg total) by mouth 2 (two) times daily.  Marland Kitchen apixaban (ELIQUIS) 5 MG TABS tablet Take 1 tablet (5 mg total) by mouth 2 (two) times daily.  Marland Kitchen atorvastatin (LIPITOR) 40 MG tablet Take 1 tablet (40 mg total) by mouth daily  at 6 PM.  . azithromycin (ZITHROMAX) 500 MG tablet   . B Complex Vitamins (B-COMPLEX/B-12 PO) Take 1 tablet by mouth daily.  . celecoxib (CELEBREX) 200 MG capsule Take 200 mg by mouth daily as needed for mild pain or moderate pain.  Marland Kitchen co-enzyme Q-10 30 MG capsule Take 30 mg by mouth 3 (three) times daily.  Marland Kitchen COLCRYS 0.6 MG tablet Take 0.6 mg by mouth as needed.   . diclofenac sodium (VOLTAREN) 1 % GEL Place 2 g onto the skin daily as needed (PAIN).   Marland Kitchen doxycycline (VIBRA-TABS) 100 MG tablet Take 100 mg by mouth 2 (two) times daily.  Marland Kitchen esomeprazole (NEXIUM) 40 MG capsule Take 40 mg by mouth daily as needed.   . febuxostat (ULORIC) 40 MG tablet Take 10 mg by mouth daily. TAKES 0.25 TABLET  DAILY  . Garlic 10 MG CAPS Take 10 mg by mouth daily.   . hydrALAZINE (APRESOLINE) 25 MG tablet Take 1 tablet (25 mg total) by mouth 3 (three) times daily as needed. (Patient taking differently: Take 25 mg by mouth 3 (three) times daily as needed. ONLY FOR HIGH BP READINGS)  . lisinopril (PRINIVIL,ZESTRIL) 10 MG tablet Take 1 tablet (10 mg total) by mouth daily.  . Magnesium Oxide 250 MG TABS Take 250 mg by mouth daily.  .  metroNIDAZOLE (FLAGYL) 500 MG tablet   . MULTIPLE VITAMIN PO Take 1 tablet by mouth daily.   . Multiple Vitamins-Minerals (PRESERVISION AREDS PO) Take 1 tablet by mouth daily.  . Multiple Vitamins-Minerals (ZINC PO) Take 1 tablet by mouth daily.  . Omega-3 Fatty Acids (FISH OIL) 1000 MG CAPS Take 1,000 mg by mouth daily.   Marland Kitchen OVER THE COUNTER MEDICATION Take 1 tablet by mouth 2 (two) times daily. Supplement for Immune Support for Lyme Disease DIM  . OVER THE COUNTER MEDICATION Take 5 mLs by mouth 3 (three) times daily. Lauracidine Immune Support for Lyme Disease  . OVER THE COUNTER MEDICATION Take 1 tablet by mouth daily. Betaglucin  . propranolol (INDERAL) 20 MG tablet Take 20 mg by mouth at bedtime as needed.  Marland Kitchen Propylene Glycol-Glycerin (SOOTHE OP) Place 1 drop into both eyes at bedtime.  . rifampin (RIFADIN) 300 MG capsule   . Saccharomyces boulardii (PROBIOTIC) 250 MG CAPS Take 1 tablet by mouth See admin instructions. TAKES 3 HOURS AFTER DOXYCYCLINE  . terazosin (HYTRIN) 1 MG capsule Take 1 mg by mouth at bedtime.  Marland Kitchen testosterone cypionate (DEPOTESTOSTERONE CYPIONATE) 200 MG/ML injection   . TESTOSTERONE CYPIONATE IM Inject 1 mL into the muscle every 14 (fourteen) days. Last dose was at the beginning of the month of February  . traMADol (ULTRAM) 50 MG tablet   . TURMERIC PO Take 1 tablet by mouth 2 (two) times daily.  . vitamin E 400 UNIT capsule Take 400 Units by mouth daily.   No facility-administered encounter medications on file as of 07/24/2016.   :  Review of Systems:  Out of a complete 14 point review of systems, all are reviewed and negative with the exception of these symptoms as listed below: Review of Systems  Neurological:       Patient has some trouble staying asleep, snores, and witnessed apnea, wakes up feeling tired, daytime fatigue, occasionally takes naps.    Epworth Sleepiness Scale 0= would never doze 1= slight chance of dozing 2= moderate chance of dozing 3=  high chance of dozing  Sitting and reading: 2 Watching TV:2 Sitting inactive in a public place (  ex. Theater or meeting):0 As a passenger in a car for an hour without a break:1 Lying down to rest in the afternoon:2 Sitting and talking to someone:0 Sitting quietly after lunch (no alcohol):2 In a car, while stopped in traffic:0 Total:9  Objective:  Neurologic Exam  Physical Exam Physical Examination:   Vitals:   07/24/16 1053  BP: 112/60  Pulse: 72  Resp: 16   General Examination: The patient is a very pleasant 83 y.o. male in no acute distress. He appears well-developed and well-nourished and well groomed.   HEENT: Normocephalic, atraumatic, pupils are equal, round and reactive to light and accommodation. Extraocular tracking is good without limitation to gaze excursion or nystagmus noted. Normal smooth pursuit is noted. Hearing is grossly intact. Face is symmetric with normal facial animation and normal facial sensation. Speech is clear with no dysarthria noted. There is no hypophonia. There is no lip, neck/head, jaw or voice tremor. Neck is supple with full range of passive and active motion. There are no carotid bruits on auscultation. Oropharynx exam reveals: mild mouth dryness, adequate dental hygiene and mild airway crowding, due to Thicker tongue and mildly redundant appearing soft palate, uvula is small, tonsils are absent. Mallampati is class I, neck circumference is 17 inches. Tongue protrudes centrally and palate elevates symmetrically.   Chest: Clear to auscultation without wheezing, rhonchi or crackles noted.  Heart: S1+S2+0, regular and normal without murmurs, rubs or gallops noted.   Abdomen: Soft, non-tender and non-distended with normal bowel sounds appreciated on auscultation.  Extremities: There is no pitting edema in the distal lower extremities bilaterally. Pedal pulses are intact.  Skin: Warm and dry without trophic changes noted.  Musculoskeletal: exam  reveals no obvious joint deformities, tenderness or joint swelling or erythema.   Neurologically:  Mental status: The patient is awake, alert and oriented in all 4 spheres. His immediate and remote memory, attention, language skills and fund of knowledge are appropriate. There is no evidence of aphasia, agnosia, apraxia or anomia. Speech is clear with normal prosody and enunciation. Thought process is linear. Mood is normal and affect is normal.  Cranial nerves II - XII are as described above under HEENT exam. In addition: shoulder shrug is normal with equal shoulder height noted. Motor exam: Normal bulk, strength and tone is noted. There is no drift, tremor or rebound. Romberg is negative. Reflexes are 2+ throughout, 1+ in the ankles. Fine motor skills and coordination: intact with normal finger taps, normal hand movements, normal rapid alternating patting, normal foot taps and normal foot agility.  Cerebellar testing: No dysmetria or intention tremor on finger to nose testing. Heel to shin is unremarkable bilaterally. There is no truncal or gait ataxia.  Sensory exam: intact to light touch in the upper and lower extremities.  Gait, station and balance: He stands easily. No veering to one side is noted. No leaning to one side is noted. Posture is [name]-appropriate and stance is narrow based. Gait shows normal stride length and normal pace. No problems turning are noted. Tandem walk is very good for [name].  Assessment and Plan:  In summary, Thomas [name] is a very pleasant 83 y.o.-year old male with  an underlying complex medical history of heart disease, including heart block, for which he recently had a pacemaker placed on 06/19/2016, recent hospitalizations for left sided numbness and then again for left-sided numbness, weakness and slurring of speech, Hx of aortic valve disease, atrial flutter and A fib, chronic back and neck pain, reflux  disease, chronic Lyme disease, hyperlipidemia, hypertension, kidney  cancer in 2006, spinal stenosis, squamous cell carcinoma, basal cell cancer, chronic kidney disease, and overweight state, whose history and physical exam are concerning for obstructive sleep apnea (OSA). I had a long chat with the patient and his wife about my findings and the diagnosis of OSA, its prognosis and treatment options. We talked about medical treatments, surgical interventions and non-pharmacological approaches. I explained in particular the risks and ramifications of untreated moderate to severe OSA, especially with respect to developing cardiovascular disease down the Road, including congestive heart failure, difficult to treat hypertension, cardiac arrhythmias, or stroke. Even type 2 diabetes has, in part, been linked to untreated OSA. Symptoms of untreated OSA include daytime sleepiness, memory problems, mood irritability and mood disorder such as depression and anxiety, lack of energy, as well as recurrent headaches, especially morning headaches. We talked about trying to maintain a healthy lifestyle in general, as well as the importance of weight control. I encouraged the patient to eat healthy, exercise daily and keep well hydrated, to keep a scheduled bedtime and wake time routine, to not skip any meals and eat healthy snacks in between meals. I advised the patient not to drive when feeling sleepy. I recommended the following at this time: sleep study with potential positive airway pressure titration. (We will score hypopneas at 4%).   I explained the sleep test procedure to the patient and also outlined possible surgical and non-surgical treatment options of OSA, including the use of a custom-made dental device (which would require a referral to a specialist dentist or oral surgeon), upper airway surgical options, such as pillar implants, radiofrequency surgery, tongue base surgery, and UPPP (which would involve a referral to an ENT surgeon). Rarely, jaw surgery such as mandibular  advancement may be considered.  I also explained the CPAP treatment option to the patient, who indicated that he would be willing to try CPAP if the need arises. I explained the importance of being compliant with PAP treatment, not only for insurance purposes but primarily to improve His symptoms, and for the patient's long term health benefit, including to reduce His cardiovascular risks. I answered all their questions today and the patient and his were in agreement. I would like to see him back after the sleep study is completed and encouraged them to call with any interim questions, concerns, problems or updates. His is reminded to keep his appointment in stroke clinic with Dr. Leonie [name] next month.   Thomas Age, MD, PhD

## 2016-07-24 NOTE — Patient Instructions (Signed)

## 2016-07-31 ENCOUNTER — Telehealth: Payer: Self-pay | Admitting: Family Medicine

## 2016-07-31 NOTE — Telephone Encounter (Signed)
Called Pt to schedule AWV with [facility] - knb °

## 2016-08-06 ENCOUNTER — Other Ambulatory Visit: Payer: Self-pay | Admitting: Cardiovascular Disease

## 2016-08-06 ENCOUNTER — Other Ambulatory Visit: Payer: Self-pay

## 2016-08-06 DIAGNOSIS — E785 Hyperlipidemia, unspecified: Secondary | ICD-10-CM

## 2016-08-06 MED ORDER — ATORVASTATIN CALCIUM 40 MG PO TABS: ORAL_TABLET | ORAL | 3 refills | Status: DC

## 2016-08-06 NOTE — Telephone Encounter (Signed)
90 say supply

## 2016-08-06 NOTE — Telephone Encounter (Signed)
Please advise if ok to refill since dose was increased to 40mg  in [facility] 06/15/16 by Dr.Kalisetti. Thank you.

## 2016-08-07 ENCOUNTER — Ambulatory Visit (INDEPENDENT_AMBULATORY_CARE_PROVIDER_SITE_OTHER): Payer: Medicare Other | Admitting: Neurology

## 2016-08-07 DIAGNOSIS — R351 Nocturia: Secondary | ICD-10-CM

## 2016-08-07 DIAGNOSIS — G4730 Sleep apnea, unspecified: Secondary | ICD-10-CM

## 2016-08-07 DIAGNOSIS — G4720 Circadian rhythm sleep disorder, unspecified type: Secondary | ICD-10-CM

## 2016-08-07 DIAGNOSIS — R9431 Abnormal electrocardiogram [ECG] [EKG]: Secondary | ICD-10-CM

## 2016-08-07 DIAGNOSIS — R0683 Snoring: Secondary | ICD-10-CM

## 2016-08-13 ENCOUNTER — Ambulatory Visit (INDEPENDENT_AMBULATORY_CARE_PROVIDER_SITE_OTHER): Payer: Medicare Other | Admitting: Neurology

## 2016-08-13 ENCOUNTER — Encounter: Payer: Self-pay | Admitting: Neurology

## 2016-08-13 VITALS — BP 111/70 | HR 67 | Ht 67.0 in | Wt 169.0 lb

## 2016-08-13 DIAGNOSIS — G459 Transient cerebral ischemic attack, unspecified: Secondary | ICD-10-CM

## 2016-08-13 NOTE — Patient Instructions (Signed)
I had a long d/w patient about his recent TIAs. small vessel disease, atrial flutter risk for recurrent stroke/TIAs, personally independently reviewed imaging studies and stroke evaluation results and answered questions.Continue Eliquis (apixaban) daily  for secondary stroke prevention and maintain strict control of hypertension with blood pressure goal below 130/90, diabetes with hemoglobin A1c goal below 6.5% and lipids with LDL cholesterol goal below 70 mg/dL. I also advised the patient to eat a healthy diet with plenty of whole grains, cereals, fruits and vegetables, exercise regularly and maintain ideal body weight. The patient is planning on having an epidural steroid injection for his chronic low back pain. I discussed the risk-benefit of holding eliquis for 3 days prior to the procedure and recommend he wait at least 3-6 months before any elective procedure. He voiced understanding. Followup in the future with my nurse practitioner in 6 months or call earlier if necessary  Stroke Prevention Some medical conditions and behaviors are associated with an increased chance of having a stroke. You may prevent a stroke by making healthy choices and managing medical conditions. How can I reduce my risk of having a stroke?  Stay physically active. Get at least 30 minutes of activity on most or all days.  Do not smoke. It may also be helpful to avoid exposure to secondhand smoke.  Limit alcohol use. Moderate alcohol use is considered to be:  No more than 2 drinks per day for men.  No more than 1 drink per day for nonpregnant women.  Eat healthy foods. This involves:  Eating 5 or more servings of fruits and vegetables a day.  Making dietary changes that address high blood pressure (hypertension), high cholesterol, diabetes, or obesity.  Manage your cholesterol levels.  Making food choices that are high in fiber and low in saturated fat, trans fat, and cholesterol may control cholesterol  levels.  Take any prescribed medicines to control cholesterol as directed by your health care provider.  Manage your diabetes.  Controlling your carbohydrate and sugar intake is recommended to manage diabetes.  Take any prescribed medicines to control diabetes as directed by your health care provider.  Control your hypertension.  Making food choices that are low in salt (sodium), saturated fat, trans fat, and cholesterol is recommended to manage hypertension.  Ask your health care provider if you need treatment to lower your blood pressure. Take any prescribed medicines to control hypertension as directed by your health care provider.  If you are 61-83 years of age, have your blood pressure checked every 3-5 years. If you are 29 years of age or older, have your blood pressure checked every year.  Maintain a healthy weight.  Reducing calorie intake and making food choices that are low in sodium, saturated fat, trans fat, and cholesterol are recommended to manage weight.  Stop drug abuse.  Avoid taking birth control pills.  Talk to your health care provider about the risks of taking birth control pills if you are over 65 years old, smoke, get migraines, or have ever had a blood clot.  Get evaluated for sleep disorders (sleep apnea).  Talk to your health care provider about getting a sleep evaluation if you snore a lot or have excessive sleepiness.  Take medicines only as directed by your health care provider.  For some people, aspirin or blood thinners (anticoagulants) are helpful in reducing the risk of forming abnormal blood clots that can lead to stroke. If you have the irregular heart rhythm of atrial fibrillation, you should be  on a blood thinner unless there is a good reason you cannot take them.  Understand all your medicine instructions.  Make sure that other conditions (such as anemia or atherosclerosis) are addressed. Get help right away if:  You have sudden weakness  or numbness of the face, arm, or leg, especially on one side of the body.  Your face or eyelid droops to one side.  You have sudden confusion.  You have trouble speaking (aphasia) or understanding.  You have sudden trouble seeing in one or both eyes.  You have sudden trouble walking.  You have dizziness.  You have a loss of balance or coordination.  You have a sudden, severe headache with no known cause.  You have new chest pain or an irregular heartbeat. Any of these symptoms may represent a serious problem that is an emergency. Do not wait to see if the symptoms will go away. Get medical help at once. Call your local emergency services (911 in [place] Do not drive yourself to the hospital. This information is not intended to replace advice given to you by your health care provider. Make sure you discuss any questions you have with your health care provider. Document Released: 05/30/2004 Document Revised: 09/28/2015 Document Reviewed: 10/23/2012 Elsevier Interactive Patient Education  2017 [employer].

## 2016-08-13 NOTE — Progress Notes (Signed)
[facility] [address] [place]. [place] [address] [phone]       OFFICE FOLLOW-UP NOTE  Thomas. Thomas [name] Date of Birth:  09/20/1932 Medical Record Number:  [id number]   HPI: Thomas [name] is a 58 year caucasian male seen today for first office f/u visit after [facility] admission for TIA in Feb 2018. 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 Symptoms had fully resolved by the time he made it to the hospital. He was seen in consultation by neurology (Dr. Doy Mince) who documented some mild sensory disturbance on the L side of the face but no other deficits. Due to his minimal deficits, he was not a candidate for thrombolytic therapy and this was deferred. He was admitted for TIA evaluation. MRI brain showed no acute ischemia with mild atrophy and mild small vessel disease. MRA of the head showed no flow limiting stenosis. Carotid Dopplers showed no hemodynamically significant stenosis. TTE was negative for embolic source. Hemoglobin a1c was 5.5 and LDL 56. His symptoms completely resolved per notes. He was discharged on aspirin, Plavix, and Lipitor 40 mg daily with recommendations to follow up with outpatient neurology after discharge. During the admission he was noted to have complete heart block and it was recommended that he have a pacemaker placed. He followed up with cardiology as an outpatient at which time he was noted to have atrial flutter, a new diagnosis for him. He underwent subsequent placement of a MDT dual chamber PPM on 06/19/16. After his pacemaker was placed, he was started on Eliquis and his aspirin and Plavix were discontinued.  On 06/25/16 the patient returned to the ED at [facility] after he developed weakness of the left arm and leg. He also reported some  difficulty with his speech. He decided to go to the hospital due to concern that he could have a head bleed due to his Eliquis. Neurology consultation was obtained and he was seen by Dr. Doy Mince. He told her that he had been having intermittent numbness of the L face and arm since his initial presentation on 06/14/16. Today his symptoms returned but now he also had weakness of the left arm and leg which were new. She documented decreased sensation on the left face, arm, and leg with 5-/5 weakness in the LLE. NIHSS was 3. He was not a candidate for tPA as he is anticoagulated. He was not felt to be a candidate for intervention due to low NIHSS score. However, she felt that he would be best served by being transferred to [facility] where intervention services are available should his condition deteriorate further. After arrival at [facility] hospital he reported that his symptoms have largely resolved and he is no longer having any weakness. He still had some mild sensory changes along the ulnar aspect of the left forearm and hand. MRI scan of the brain on admission showed no acute infarct and only mild generalized atrophy and changes of small vessel disease. MRA of the brain showed no large vessel intracranial stenosis or occlusion. LDL cholesterol was 75 mg percent. Hemoglobin A1c was 5.5. Transthoracic echo showed normal ejection fraction. Carotid Doppler showed bilateral mild right greater than left 50% stenosis. Patient was on eliquis for atrial flutter which was continued. He states his done well since discharge. He still  has some occasional numbness in his hand but it's getting better. He has been eating quite healthy. He used to run 4 miles per day and wants to start doing that again. He has significant back pain which limits his physical activity. He will need epidural steroid injection but will have to come off eliquis for that. He had a polysomnogram done last week but the results are yet pending. He has no  neurological complaints.  ROS:   14 system review of systems is positive for  ringing in the ears, light sensitivity, insomnia, frequent waking, daytime sleepiness, frequency of urination, back pain, neck pain, headache, tingling in the hands and all other systems negative PMH:  Past Medical History:  Diagnosis Date  . Aortic valve disorders   . Arthritis   . Atrial flutter ([place]) 06/18/2016   "AF or AFl; not sure which" (06/23/2016)  . Basal cell carcinoma    "face, nose left shoulder, left arm" (06/19/2016)  . BBB (bundle branch block)    hx right  . Chronic back pain    "neck, thoracic, lower back" (06/19/2016)  . Complete heart block ([place]) 06/2016  . GERD (gastroesophageal reflux disease)   . Heart block    "I've had type I, II [place] before now" (06/19/2016)  . History of gout   . History of hiatal hernia    "self dx'd" (06/19/2016)  . Hyperlipidemia   . Hypertension   . Lyme disease    "dx'd by me 2003; cx's showed dx 08/2015"  . Migraine    "3-4/year" (06/19/2016)  . Presence of permanent cardiac pacemaker 06/19/2016  . PVC's (premature ventricular contractions)   . Renal cancer, left ([place]) 2006   S/P cryotherapy  . Spinal stenosis    "cervical, 1 thoracic, lumbar" (06/19/2016)  . Squamous carcinoma    "face, nose left shoulder, left arm" (06/19/2016)  . Stroke ([place])   . TIA (transient ischemic attack) 06/14/2016   "I'm not sure that's what it was" (06/25/2016)    Social History:  Social History   Social History  . Marital status: Married    Spouse name: N/A  . Number of children: 3  . Years of education: MD    Occupational History  . Retired     Social History Main Topics  . Smoking status: Never Smoker  . Smokeless tobacco: Never Used  . Alcohol use 1.2 oz/week    2 Glasses of wine per week     Comment: occasionally  . Drug use: No  . Sexual activity: Yes   Other Topics Concern  . Not on file   Social History Narrative   Independent at baseline. Lives  with family   Drinks tea in the morning     Medications:   Current Outpatient Prescriptions on File Prior to Visit  Medication Sig Dispense Refill  . apixaban (ELIQUIS) 2.5 MG TABS tablet Take 1 tablet (2.5 mg total) by mouth 2 (two) times daily. 60 tablet 11  . atorvastatin (LIPITOR) 40 MG tablet TAKE 1 TABLET BY MOUTH EVERY DAY AT 6PM 90 tablet 3  . azithromycin (ZITHROMAX) 500 MG tablet     . B Complex Vitamins (B-COMPLEX/B-12 PO) Take 1 tablet by mouth daily.    Marland Kitchen co-enzyme Q-10 30 MG capsule Take 30 mg by mouth 3 (three) times daily.    Marland Kitchen COLCRYS 0.6 MG tablet Take 0.6 mg by mouth as needed.     . diclofenac sodium (VOLTAREN) 1 % GEL Place 2 g onto  the skin daily as needed (PAIN).     Marland Kitchen doxycycline (VIBRA-TABS) 100 MG tablet Take 100 mg by mouth 2 (two) times daily.    Marland Kitchen esomeprazole (NEXIUM) 40 MG capsule Take 40 mg by mouth daily as needed.     . febuxostat (ULORIC) 40 MG tablet Take 10 mg by mouth daily. TAKES 0.25 TABLET  DAILY    . Garlic 10 MG CAPS Take 10 mg by mouth daily.     . hydrALAZINE (APRESOLINE) 25 MG tablet Take 1 tablet (25 mg total) by mouth 3 (three) times daily as needed. (Patient taking differently: Take 25 mg by mouth 3 (three) times daily as needed. ONLY FOR HIGH BP READINGS) 90 tablet 6  . lisinopril (PRINIVIL,ZESTRIL) 10 MG tablet Take 1 tablet (10 mg total) by mouth daily. 30 tablet 1  . Magnesium Oxide 250 MG TABS Take 250 mg by mouth daily.    . metroNIDAZOLE (FLAGYL) 500 MG tablet     . MULTIPLE VITAMIN PO Take 1 tablet by mouth daily.     . Multiple Vitamins-Minerals (PRESERVISION AREDS PO) Take 1 tablet by mouth daily.    . Multiple Vitamins-Minerals (ZINC PO) Take 1 tablet by mouth daily.    . Omega-3 Fatty Acids (FISH OIL) 1000 MG CAPS Take 1,000 mg by mouth daily.     Marland Kitchen OVER THE COUNTER MEDICATION Take 1 tablet by mouth 2 (two) times daily. Supplement for Immune Support for Lyme Disease DIM    . OVER THE COUNTER MEDICATION Take 5 mLs by mouth 3  (three) times daily. Lauracidine Immune Support for Lyme Disease    . OVER THE COUNTER MEDICATION Take 1 tablet by mouth daily. Betaglucin    . propranolol (INDERAL) 20 MG tablet Take 20 mg by mouth at bedtime as needed.    Marland Kitchen Propylene Glycol-Glycerin (SOOTHE OP) Place 1 drop into both eyes at bedtime.    . rifampin (RIFADIN) 300 MG capsule     . Saccharomyces boulardii (PROBIOTIC) 250 MG CAPS Take 1 tablet by mouth See admin instructions. TAKES 3 HOURS AFTER DOXYCYCLINE    . terazosin (HYTRIN) 1 MG capsule Take 1 mg by mouth at bedtime.    Marland Kitchen testosterone cypionate (DEPOTESTOSTERONE CYPIONATE) 200 MG/ML injection     . TESTOSTERONE CYPIONATE IM Inject 1 mL into the muscle every 14 (fourteen) days. Last dose was at the beginning of the month of February    . traMADol (ULTRAM) 50 MG tablet     . TURMERIC PO Take 1 tablet by mouth 2 (two) times daily.    . vitamin E 400 UNIT capsule Take 400 Units by mouth daily.     No current facility-administered medications on file prior to visit.     Allergies:   Allergies  Allergen Reactions  . Iodine Hives    IVP contrast  . Penicillins Itching    ITCHY FEELING IN FINGERS Has patient had a PCN reaction causing immediate rash, facial/tongue/throat swelling, SOB or lightheadedness with hypotension: No Has patient had a PCN reaction causing severe rash involving mucus membranes or skin necrosis: No Has patient had a PCN reaction that required hospitalization No Has patient had a PCN reaction occurring within the last 10 years: No If all of the above answers are "NO", then may proceed with Cephalosporin use.     Physical Exam General: Frail elderly Caucasian male, seated, in no evident distress Head: head normocephalic and atraumatic.  Neck: supple with no carotid or supraclavicular bruits Cardiovascular: regular  rate and rhythm, no murmurs Musculoskeletal: no deformity Skin:  no rash/petichiae Vascular:  Normal pulses all extremities Vitals:     08/13/16 1500  BP: 111/70  Pulse: 67   Neurologic Exam Mental Status: Awake and fully alert. Oriented to place and time. Recent and remote memory intact. Attention span, concentration and fund of knowledge appropriate. Mood and affect appropriate.  Cranial Nerves: Fundoscopic exam reveals sharp disc margins. Pupils equal, briskly reactive to light. Extraocular movements full without nystagmus. Visual fields full to confrontation. Hearing intact. Facial sensation intact. Face, tongue, palate moves normally and symmetrically.  Motor: Normal bulk and tone. Normal strength in all tested extremity muscles. Sensory.: intact to touch ,pinprick .position and vibratory sensation.  Coordination: Rapid alternating movements normal in all extremities. Finger-to-nose and heel-to-shin performed accurately bilaterally. Gait and Station: Arises from chair without difficulty. Stance is normal. Gait demonstrates normal stride length and balance . Able to heel, toe and tandem walk with mild difficulty.  Reflexes: 1+ and symmetric. Toes downgoing.   NIHSS  0 Modified Rankin  1  ASSESSMENT: 40 year caucasian male with recurrent episode of left body paresthesias likely TIAs due to small vessel disease. He does have atrial flutter and episodes occurred despite being on eliquis. Vascular risk factors of hyperlipidemia, hypertension, atrial flutter   PLAN: I had a long d/w patient about his recent TIAs. small vessel disease, atrial flutter risk for recurrent stroke/TIAs, personally independently reviewed imaging studies and stroke evaluation results and answered questions.Continue Eliquis (apixaban) daily  for secondary stroke prevention and maintain strict control of hypertension with blood pressure goal below 130/90, diabetes with hemoglobin A1c goal below 6.5% and lipids with LDL cholesterol goal below 70 mg/dL. I also advised the patient to eat a healthy diet with plenty of whole grains, cereals, fruits and  vegetables, exercise regularly and maintain ideal body weight. The patient is planning on having an epidural steroid injection for his chronic low back pain. I discussed the risk-benefit of holding eliquis for 3 days prior to the procedure and recommend he wait at least 3-6 months before any elective procedure. He voiced understanding. Greater than 50% time during this 25 minute visit was spent on counseling and coordination of care about TIA and stroke risk, discussion about prevention and treatment options and answering questions Followup in the future with my nurse practitioner in 6 months or call earlier if necessary Antony Contras, MD  [facility] [address] [place], [place] [address]  Phone [phone] Fax [phone] Note: This document was prepared with digital dictation and possible smart phrase technology. Any transcriptional errors that result from this process are unintentional

## 2016-08-15 ENCOUNTER — Telehealth: Payer: Self-pay

## 2016-08-15 ENCOUNTER — Other Ambulatory Visit: Payer: Self-pay | Admitting: Cardiovascular Disease

## 2016-08-15 NOTE — Procedures (Signed)
PATIENT'S NAME:  Thomas [name], Pent[place] DOB:      [birth year]      MR#:    [id number]     DATE OF RECORDING: 08/07/2016 REFERRING M.D.:  Delfino Lovett Cranford Mon, MD Study Performed:   Baseline Polysomnogram HISTORY: 83 year old man, with a history of heart disease, including heart block, for which he recently had a pacemaker placed on 06/19/2016, recent hospitalizations for left sided numbness and then again for left-sided numbness, weakness and slurring of speech, Hx of aortic valve disease, atrial flutter and A fib, chronic back and neck pain, reflux disease, chronic Lyme disease, hyperlipidemia, hypertension, kidney cancer in 2006, spinal stenosis, squamous cell carcinoma, basal cell cancer, chronic kidney disease, and overweight state, who reports snoring and excessive daytime somnolence, and wife reports witnessed apneic pauses while asleep. The patient endorsed the Epworth Sleepiness Scale at 9 points. The patient's weight 170 pounds with a height of 67 (inches), resulting in a BMI of 26.6 kg/m2. The patient's neck circumference measured 17 inches.  CURRENT MEDICATIONS: Eliquis, Lipitor, Zithromax, Celebrex, Co Q 10, Colcrys, Voltaren, Doxycycline, Nexium, Uloric, Apresoline, Prinivil, MAgnesium, Flagyl, Zinc, Fish Oil, Inderal, Rifadin, Probiotic, Hytrin, Ultram, multivitamin, Turmeric   PROCEDURE:  This is a multichannel digital polysomnogram utilizing the Somnostar 11.2 system.  Electrodes and sensors were applied and monitored per AASM Specifications.   EEG, EOG, Chin and Limb EMG, were sampled at 200 Hz.  ECG, Snore and Nasal Pressure, Thermal Airflow, Respiratory Effort, CPAP Flow and Pressure, Oximetry was sampled at 50 Hz. Digital video and audio were recorded.      BASELINE STUDY  Lights Out was at 21:33 and Lights On at 05:07.  Total recording time (TRT) was 454.5 minutes, with a total sleep time (TST) of  353 minutes.   The patient's sleep latency was 9 minutes, which is normal.  REM latency was 149.5  minutes, which is prolonged.  The sleep efficiency was 77.7 %.     SLEEP ARCHITECTURE: WASO (Wake after sleep onset) was 92.5 minutes with a few longer periods of wakefulness.  There were 11 minutes in Stage N1, 254 minutes Stage N2, 35.5 minutes Stage N3 and 52.5 minutes in Stage REM.  The percentage of Stage N1 was 3.1%, Stage N2 was 72.%, which is increased, Stage N3 was 10.1% and Stage R (REM sleep) was 14.9%, which is reduced.   The arousals were noted as: 71 were spontaneous, 0 were associated with PLMs, 14 were associated with respiratory events.    Audio and video analysis did not show any abnormal or unusual movements, behaviors, phonations or vocalizations.  The patient took 4 bathroom breaks. Mild to moderate and at times loud snoring was noted.  The EKG showed pacemaker artifact, paced heartbeats and what appeared to be intermittent atrial flutter.   RESPIRATORY ANALYSIS:  There were a total of 16 respiratory events:  6 obstructive apneas, 0 central apneas and 0 mixed apneas with a total of 6 apneas and an apnea index (AI) of 1. /hour. There were 10 hypopneas with a hypopnea index of 1.7 /hour. The patient also had 0 respiratory event related arousals (RERAs).      The total APNEA/HYPOPNEA INDEX (AHI) was 2.7/hour and the total RESPIRATORY DISTURBANCE INDEX was 2.7 /hour.  3 events occurred in REM sleep and 14 events in NREM. The REM AHI was 3.4 /hour, versus a non-REM AHI of 2.6. The patient spent 0 minutes of total sleep time in the supine position and 353 minutes in non-supine.. The  supine AHI was 0.0 versus a non-supine AHI of 2.7.  OXYGEN SATURATION & C02:  The Wake baseline 02 saturation was 93%, with the lowest being 83% (which was an error due to lost signal, true nadir: 88%). Time spent below 89% saturation equaled 0 minutes.  PERIODIC LIMB MOVEMENTS: The patient had a total of 0 Periodic Limb Movements.  The Periodic Limb Movement (PLM) index was 0 and the PLM Arousal index was  0/hour.  Post-study, the patient indicated that sleep was the same as usual.   IMPRESSION: 1. Primary Snoring 2. Nocturia 3. Abnormal EKG 4. Dysfunctions associated with sleep stages or arousal from sleep  RECOMMENDATIONS: 1. This study does not demonstrate any significant obstructive or central sleep disordered breathing. There is snoring, which was intermittent and ranged from mild to loud. For this, some weight loss may help, an ENT evaluation may be feasible at some point. This study does not support an intrinsic sleep disorder as a cause of the patient's symptoms. Other causes, including circadian rhythm disturbances, an underlying mood disorder, medication effect and/or an underlying medical problem cannot be ruled out. 2. There was significant nocturia; evaluation with urologist may be considered, if not already done.  3. The study showed EKG abnormalities. Follow up with cardiology is recommended. 4. This study shows sleep fragmentation and abnormal sleep stage percentages; these are nonspecific findings and per se do not signify an intrinsic sleep disorder or a cause for the patient's sleep-related symptoms. Causes include (but are not limited to) the first night effect of the sleep study, circadian rhythm disturbances, medication effect or an underlying mood disorder or medical problem. 5. The patient should be cautioned not to drive, work at heights, or operate dangerous or heavy equipment when tired or sleepy. Review and reiteration of good sleep hygiene measures should be pursued with any patient. 6. The patient can follow up with his current providers.   I certify that I have reviewed the entire raw data recording prior to the issuance of this report in accordance with the Standards of Accreditation of the American Academy of Sleep Medicine (AASM)  Star Age, MD, PhD Diplomat, American Board of Psychiatry and Neurology (Neurology and Sleep Medicine)

## 2016-08-15 NOTE — Telephone Encounter (Signed)
I called pt to discuss his sleep study results. No answer, left a message asking him to call me back. 

## 2016-08-15 NOTE — Progress Notes (Signed)
Patient referred by hospitalist, seen by me on 07/24/16, diagnostic PSG on 08/07/16.   Please call and notify the patient that the recent sleep study did not show any significant obstructive sleep apnea. There was snoring, which was intermittent and ranged from mild to loud. For this, some weight loss may help, an ENT evaluation may be feasible at some point. There was significant nocturia (x4); evaluation with urologist may be considered, if not already done (patient is a retired Dealer himself). The study showed EKG abnormalities (none new). Follow up with cardiology is recommended. Patient can FU with existing providers and CM as scheduled as well.  Once you have spoken to patient, you can close this encounter.   Thanks,  Star Age, MD, PhD [facility])

## 2016-08-15 NOTE — Telephone Encounter (Signed)
-----   Message from Star Age, MD sent at 08/15/2016  8:15 AM EDT ----- Patient referred by hospitalist, seen by me on 07/24/16, diagnostic PSG on 08/07/16.   Please call and notify the patient that the recent sleep study did not show any significant obstructive sleep apnea. There was snoring, which was intermittent and ranged from mild to loud. For this, some weight loss may help, an ENT evaluation may be feasible at some point. There was significant nocturia (x4); evaluation with urologist may be considered, if not already done (patient is a retired Dealer himself). The study showed EKG abnormalities (none new). Follow up with cardiology is recommended. Patient can FU with existing providers and CM as scheduled as well.  Once you have spoken to patient, you can close this encounter.   Thanks,  Star Age, MD, PhD [facility])

## 2016-08-21 NOTE — Telephone Encounter (Addendum)
Pt's wife, Jacqlyn Larsen, per DPR, returned my call. I advised pt's wife that Dr. Rexene Alberts reviewed pt's sleep study and found that pt did not show any significant sleep apnea, but he did have mild to loud intermittent snoring, and significant nocturia, and EKG abnormalities. Dr. Rexene Alberts recommends that pt lose weight to help with the snoring, and perhaps an ENT evaluation may be feasible. For the nocturia, a urology evaluation may be considered. I advised pt's wife that he should follow up with his cardiologist for the EKG abnormalities. Pt's wife says that he sees Dr. Rockey Situ. I advised pt that a copy of these sleep study results will be sent to Dr. Rosanna Randy and Dr. Rockey Situ, per pt's wife's request. Pt's wife asked that a copy of the sleep study results be mailed to them. I verified with pt's wife that the address we have on file is correct.  Pt's wife verbalized understanding of results. Pt's wife had no questions at this time but was encouraged to call back if questions arise. Pt's wife reports that she may call our office to make a follow up appt with Dr. Rexene Alberts to discuss the sleep study results in more detail if the pt feels this is necessary.

## 2016-08-27 ENCOUNTER — Telehealth: Payer: Self-pay

## 2016-08-27 ENCOUNTER — Other Ambulatory Visit: Payer: Self-pay

## 2016-08-27 MED ORDER — SUVOREXANT 10 MG PO TABS: 1.0000 | ORAL_TABLET | Freq: Every day | ORAL | 5 refills | Status: DC

## 2016-08-27 MED ORDER — SUVOREXANT 5 MG PO TABS: 1.0000 | ORAL_TABLET | Freq: Every day | ORAL | 5 refills | Status: DC

## 2016-08-27 NOTE — Telephone Encounter (Signed)
6 months of Testosterone rf. Belsomra for 6 months at lower dose range.

## 2016-08-27 NOTE — Telephone Encounter (Signed)
testosterone compound called in to [facility] and [place] faaxed in to [facility]

## 2016-08-27 NOTE — Telephone Encounter (Signed)
Patient states he is due for refill on testosterone compound 5%  apply once a day and gets this at [facility] is this ok to refill? Also patient states he has had issues with insomnia and recently tried samples of Belsamra and would like to get a RX for it he states this seems to be helping. I advised patient we may need to see him to discuss this but that I would ask. Please review-aa

## 2016-08-29 ENCOUNTER — Ambulatory Visit (INDEPENDENT_AMBULATORY_CARE_PROVIDER_SITE_OTHER): Payer: Medicare Other | Admitting: Cardiovascular Disease

## 2016-08-29 ENCOUNTER — Telehealth: Payer: Self-pay | Admitting: Cardiovascular Disease

## 2016-08-29 ENCOUNTER — Other Ambulatory Visit: Payer: Self-pay | Admitting: Anesthesiology

## 2016-08-29 ENCOUNTER — Encounter: Payer: Self-pay | Admitting: Cardiovascular Disease

## 2016-08-29 ENCOUNTER — Ambulatory Visit: Payer: Medicare Other | Attending: Anesthesiology | Admitting: Anesthesiology

## 2016-08-29 ENCOUNTER — Encounter: Payer: Self-pay | Admitting: Anesthesiology

## 2016-08-29 ENCOUNTER — Ambulatory Visit
Admission: RE | Admit: 2016-08-29 | Discharge: 2016-08-29 | Disposition: A | Discharge status: Home / Self Care | Payer: Medicare Other | Source: Ambulatory Visit | Attending: Anesthesiology | Admitting: Anesthesiology

## 2016-08-29 VITALS — BP 131/72 | HR 52 | Temp 98.0°F | Resp 14 | Ht 67.0 in | Wt 165.0 lb

## 2016-08-29 VITALS — BP 140/82 | HR 61 | Ht 67.0 in | Wt 169.5 lb

## 2016-08-29 DIAGNOSIS — I639 Cerebral infarction, unspecified: Secondary | ICD-10-CM | POA: Diagnosis not present

## 2016-08-29 DIAGNOSIS — I480 Paroxysmal atrial fibrillation: Secondary | ICD-10-CM

## 2016-08-29 DIAGNOSIS — E784 Other hyperlipidemia: Secondary | ICD-10-CM

## 2016-08-29 DIAGNOSIS — R52 Pain, unspecified: Secondary | ICD-10-CM

## 2016-08-29 DIAGNOSIS — Z950 Presence of cardiac pacemaker: Secondary | ICD-10-CM | POA: Insufficient documentation

## 2016-08-29 DIAGNOSIS — I10 Essential (primary) hypertension: Secondary | ICD-10-CM | POA: Diagnosis not present

## 2016-08-29 DIAGNOSIS — M542 Cervicalgia: Secondary | ICD-10-CM | POA: Diagnosis not present

## 2016-08-29 DIAGNOSIS — Z8673 Personal history of transient ischemic attack (TIA), and cerebral infarction without residual deficits: Secondary | ICD-10-CM

## 2016-08-29 DIAGNOSIS — M5136 Other intervertebral disc degeneration, lumbar region: Secondary | ICD-10-CM

## 2016-08-29 DIAGNOSIS — R200 Anesthesia of skin: Secondary | ICD-10-CM | POA: Diagnosis not present

## 2016-08-29 DIAGNOSIS — E7849 Other hyperlipidemia: Secondary | ICD-10-CM

## 2016-08-29 DIAGNOSIS — G8929 Other chronic pain: Secondary | ICD-10-CM | POA: Diagnosis not present

## 2016-08-29 DIAGNOSIS — I442 Atrioventricular block, complete: Secondary | ICD-10-CM

## 2016-08-29 DIAGNOSIS — M79605 Pain in left leg: Secondary | ICD-10-CM | POA: Diagnosis present

## 2016-08-29 DIAGNOSIS — M545 Low back pain: Secondary | ICD-10-CM | POA: Insufficient documentation

## 2016-08-29 DIAGNOSIS — M5432 Sciatica, left side: Secondary | ICD-10-CM

## 2016-08-29 MED ORDER — METOPROLOL SUCCINATE ER 25 MG PO TB24: 25.0000 mg | ORAL_TABLET | Freq: Every day | ORAL | 3 refills | Status: DC

## 2016-08-29 MED ORDER — SODIUM CHLORIDE 0.9% FLUSH
10.0000 mL | Freq: Once | INTRAVENOUS | Status: AC
  Administered 2016-08-29: 10 mL

## 2016-08-29 MED ORDER — TRIAMCINOLONE ACETONIDE 40 MG/ML IJ SUSP
40.0000 mg | Freq: Once | INTRAMUSCULAR | Status: AC
  Administered 2016-08-29: 40 mg

## 2016-08-29 MED ORDER — ROPIVACAINE HCL 2 MG/ML IJ SOLN
INTRAMUSCULAR | Status: AC
  Filled 2016-08-29: qty 10

## 2016-08-29 MED ORDER — IOPAMIDOL (ISOVUE-M 200) INJECTION 41%
INTRAMUSCULAR | Status: AC
  Filled 2016-08-29: qty 10

## 2016-08-29 MED ORDER — LIDOCAINE HCL (PF) 1 % IJ SOLN
INTRAMUSCULAR | Status: AC
  Filled 2016-08-29: qty 5

## 2016-08-29 MED ORDER — ROPIVACAINE HCL 2 MG/ML IJ SOLN
10.0000 mL | Freq: Once | INTRAMUSCULAR | Status: AC
  Administered 2016-08-29: 10 mL via EPIDURAL

## 2016-08-29 MED ORDER — SODIUM CHLORIDE 0.9 % IJ SOLN
INTRAMUSCULAR | Status: AC
  Filled 2016-08-29: qty 10

## 2016-08-29 MED ORDER — TRIAMCINOLONE ACETONIDE 40 MG/ML IJ SUSP
INTRAMUSCULAR | Status: AC
Start: 2016-08-29 — End: 2016-08-29
  Filled 2016-08-29: qty 1

## 2016-08-29 MED ORDER — IOPAMIDOL (ISOVUE-M 200) INJECTION 41%
20.0000 mL | Freq: Once | INTRAMUSCULAR | Status: DC | PRN
  Administered 2016-08-29: 10 mL
  Filled 2016-08-29: qty 20

## 2016-08-29 NOTE — Patient Instructions (Signed)
Medication Instructions:   Please start metoprolol succinate one a day (25 mg) Ok to take extra propranolol as needed   Labwork:  No new labs needed  Testing/Procedures:  No further testing at this time   I recommend watching educational videos on topics of interest to you at:       [email or web address]  Enter code: [email or web address]    Follow-Up: It was a pleasure seeing you in the office today. Please call us if you have new issues that need to be addressed before your next appt.  [phone]  Your physician wants you to follow-up in: 6 months.  You will receive a reminder letter in the mail two months in advance. If you don't receive a letter, please call our office to schedule the follow-up appointment.  If you need a refill on your cardiac medications before your next appointment, please call your pharmacy.

## 2016-08-29 NOTE — Patient Instructions (Signed)
Pain Management Discharge Instructions  General Discharge Instructions :  If you need to reach your doctor call: Monday-Friday 8:00 am - 4:00 pm at [phone] or toll free [phone].  After clinic hours [phone] to have operator reach doctor.  Bring all of your medication bottles to all your appointments in the pain clinic.  To cancel or reschedule your appointment with Pain Management please remember to call 24 hours in advance to avoid a fee.  Refer to the educational materials which you have been given on: General Risks, I had my Procedure. Discharge Instructions, Post Sedation.  Post Procedure Instructions:  The drugs you were given will stay in your system until tomorrow, so for the next 24 hours you should not drive, make any legal decisions or drink any alcoholic beverages.  You may eat anything you prefer, but it is better to start with liquids then soups and crackers, and gradually work up to solid foods.  Please notify your doctor immediately if you have any unusual bleeding, trouble breathing or pain that is not related to your normal pain.  Depending on the type of procedure that was done, some parts of your body may feel week and/or numb.  This usually clears up by tonight or the next day.  Walk with the use of an assistive device or accompanied by an adult for the 24 hours.  You may use ice on the affected area for the first 24 hours.  Put ice in a Ziploc bag and cover with a towel and place against area 15 minutes on 15 minutes off.  You may switch to heat after 24 hours.GENERAL RISKS AND COMPLICATIONS  What are the risk, side effects and possible complications? Generally speaking, most procedures are safe.  However, with any procedure there are risks, side effects, and the possibility of complications.  The risks and complications are dependent upon the sites that are lesioned, or the type of nerve block to be performed.  The closer the procedure is to the spine,  the more serious the risks are.  Great care is taken when placing the radio frequency needles, block needles or lesioning probes, but sometimes complications can occur. 1. Infection: Any time there is an injection through the skin, there is a risk of infection.  This is why sterile conditions are used for these blocks.  There are four possible types of infection. 1. Localized skin infection. 2. Central Nervous System Infection-This can be in the form of Meningitis, which can be deadly. 3. Epidural Infections-This can be in the form of an epidural abscess, which can cause pressure inside of the spine, causing compression of the spinal cord with subsequent paralysis. This would require an emergency surgery to decompress, and there are no guarantees that the patient would recover from the paralysis. 4. Discitis-This is an infection of the intervertebral discs.  It occurs in about 1% of discography procedures.  It is difficult to treat and it may lead to surgery.        2. Pain: the needles have to go through skin and soft tissues, will cause soreness.       3. Damage to internal structures:  The nerves to be lesioned may be near blood vessels or    other nerves which can be potentially damaged.       4. Bleeding: Bleeding is more common if the patient is taking blood thinners such as  aspirin, Coumadin, Ticiid, Plavix, etc., or if he/she have some genetic predisposition  such as   hemophilia. Bleeding into the spinal canal can cause compression of the spinal  cord with subsequent paralysis.  This would require an emergency surgery to  decompress and there are no guarantees that the patient would recover from the  paralysis.       5. Pneumothorax:  Puncturing of a lung is a possibility, every time a needle is introduced in  the area of the chest or upper back.  Pneumothorax refers to free air around the  collapsed lung(s), inside of the thoracic cavity (chest cavity).  Another two possible  complications  related to a similar event would include: Hemothorax and Chylothorax.   These are variations of the Pneumothorax, where instead of air around the collapsed  lung(s), you may have blood or chyle, respectively.       6. Spinal headaches: They may occur with any procedures in the area of the spine.       7. Persistent CSF (Cerebro-Spinal Fluid) leakage: This is a rare problem, but may occur  with prolonged intrathecal or epidural catheters either due to the formation of a fistulous  track or a dural tear.       8. Nerve damage: By working so close to the spinal cord, there is always a possibility of  nerve damage, which could be as serious as a permanent spinal cord injury with  paralysis.       9. Death:  Although rare, severe deadly allergic reactions known as "Anaphylactic  reaction" can occur to any of the medications used.      10. Worsening of the symptoms:  We can always make thing worse.  What are the chances of something like this happening? Chances of any of this occuring are extremely low.  By statistics, you have more of a chance of getting killed in a motor vehicle accident: while driving to the hospital than any of the above occurring .  Nevertheless, you should be aware that they are possibilities.  In general, it is similar to taking a shower.  Everybody knows that you can slip, hit your head and get killed.  Does that mean that you should not shower again?  Nevertheless always keep in mind that statistics do not mean anything if you happen to be on the wrong side of them.  Even if a procedure has a 1 (one) in a 1,000,000 (million) chance of going wrong, it you happen to be that one..Also, keep in mind that by statistics, you have more of a chance of having something go wrong when taking medications.  Who should not have this procedure? If you are on a blood thinning medication (e.g. Coumadin, Plavix, see list of "Blood Thinners"), or if you have an active infection going on, you should not  have the procedure.  If you are taking any blood thinners, please inform your physician.  How should I prepare for this procedure?  Do not eat or drink anything at least six hours prior to the procedure.  Bring a driver with you .  It cannot be a taxi.  Come accompanied by an adult that can drive you back, and that is strong enough to help you if your legs get weak or numb from the local anesthetic.  Take all of your medicines the morning of the procedure with just enough water to swallow them.  If you have diabetes, make sure that you are scheduled to have your procedure done first thing in the morning, whenever possible.  If you have diabetes,   take only half of your insulin dose and notify our nurse that you have done so as soon as you arrive at the clinic.  If you are diabetic, but only take blood sugar pills (oral hypoglycemic), then do not take them on the morning of your procedure.  You may take them after you have had the procedure.  Do not take aspirin or any aspirin-containing medications, at least eleven (11) days prior to the procedure.  They may prolong bleeding.  Wear loose fitting clothing that may be easy to take off and that you would not mind if it got stained with Betadine or blood.  Do not wear any jewelry or perfume  Remove any nail coloring.  It will interfere with some of our monitoring equipment.  NOTE: Remember that this is not meant to be interpreted as a complete list of all possible complications.  Unforeseen problems may occur.  BLOOD THINNERS The following drugs contain aspirin or other products, which can cause increased bleeding during surgery and should not be taken for 2 weeks prior to and 1 week after surgery.  If you should need take something for relief of minor pain, you may take acetaminophen which is found in Tylenol,m Datril, Anacin-3 and Panadol. It is not blood thinner. The products listed below are.  Do not take any of the products listed below  in addition to any listed on your instruction sheet.  A.P.C or A.P.C with Codeine Codeine Phosphate Capsules #3 Ibuprofen Ridaura  ABC compound Congesprin Imuran rimadil  Advil Cope Indocin Robaxisal  Alka-Seltzer Effervescent Pain Reliever and Antacid Coricidin or Coricidin-D  Indomethacin Rufen  Alka-Seltzer plus Cold Medicine Cosprin Ketoprofen S-A-C Tablets  Anacin Analgesic Tablets or Capsules Coumadin Korlgesic Salflex  Anacin Extra Strength Analgesic tablets or capsules CP-2 Tablets Lanoril Salicylate  Anaprox Cuprimine Capsules Levenox Salocol  Anexsia-D Dalteparin Magan Salsalate  Anodynos Darvon compound Magnesium Salicylate Sine-off  Ansaid Dasin Capsules Magsal Sodium Salicylate  Anturane Depen Capsules Marnal Soma  APF Arthritis pain formula Dewitt's Pills Measurin Stanback  Argesic Dia-Gesic Meclofenamic Sulfinpyrazone  Arthritis Bayer Timed Release Aspirin Diclofenac Meclomen Sulindac  Arthritis pain formula Anacin Dicumarol Medipren Supac  Analgesic (Safety coated) Arthralgen Diffunasal Mefanamic Suprofen  Arthritis Strength Bufferin Dihydrocodeine Mepro Compound Suprol  Arthropan liquid Dopirydamole Methcarbomol with Aspirin Synalgos  ASA tablets/Enseals Disalcid Micrainin Tagament  Ascriptin Doan's Midol Talwin  Ascriptin A/D Dolene Mobidin Tanderil  Ascriptin Extra Strength Dolobid Moblgesic Ticlid  Ascriptin with Codeine Doloprin or Doloprin with Codeine Momentum Tolectin  Asperbuf Duoprin Mono-gesic Trendar  Aspergum Duradyne Motrin or Motrin IB Triminicin  Aspirin plain, buffered or enteric coated Durasal Myochrisine Trigesic  Aspirin Suppositories Easprin Nalfon Trillsate  Aspirin with Codeine Ecotrin Regular or Extra Strength Naprosyn Uracel  Atromid-S Efficin Naproxen Ursinus  Auranofin Capsules Elmiron Neocylate Vanquish  Axotal Emagrin Norgesic Verin  Azathioprine Empirin or Empirin with Codeine Normiflo Vitamin E  Azolid Emprazil Nuprin Voltaren  Bayer  Aspirin plain, buffered or children's or timed BC Tablets or powders Encaprin Orgaran Warfarin Sodium  Buff-a-Comp Enoxaparin Orudis Zorpin  Buff-a-Comp with Codeine Equegesic Os-Cal-Gesic   Buffaprin Excedrin plain, buffered or Extra Strength Oxalid   Bufferin Arthritis Strength Feldene Oxphenbutazone   Bufferin plain or Extra Strength Feldene Capsules Oxycodone with Aspirin   Bufferin with Codeine Fenoprofen Fenoprofen Pabalate or Pabalate-SF   Buffets II Flogesic Panagesic   Buffinol plain or Extra Strength Florinal or Florinal with Codeine Panwarfarin   Buf-Tabs Flurbiprofen Penicillamine   Butalbital Compound Four-way cold tablets   Penicillin   Butazolidin Fragmin Pepto-Bismol   Carbenicillin Geminisyn Percodan   Carna Arthritis Reliever Geopen Persantine   Carprofen Gold's salt Persistin   Chloramphenicol Goody's Phenylbutazone   Chloromycetin Haltrain Piroxlcam   Clmetidine heparin Plaquenil   Cllnoril Hyco-pap Ponstel   Clofibrate Hydroxy chloroquine Propoxyphen         Before stopping any of these medications, be sure to consult the physician who ordered them.  Some, such as Coumadin (Warfarin) are ordered to prevent or treat serious conditions such as "deep thrombosis", "pumonary embolisms", and other heart problems.  The amount of time that you may need off of the medication may also vary with the medication and the reason for which you were taking it.  If you are taking any of these medications, please make sure you notify your pain physician before you undergo any procedures.         Epidural Steroid Injection An epidural steroid injection is a shot of steroid medicine and numbing medicine that is given into the space between the spinal cord and the bones in your back (epidural space). The shot helps relieve pain caused by an irritated or swollen nerve root. The amount of pain relief you get from the injection depends on what is causing the nerve to be swollen and  irritated, and how long your pain lasts. You are more likely to benefit from this injection if your pain is strong and comes on suddenly rather than if you have had pain for a long time. Tell a health care provider about:  Any allergies you have.  All medicines you are taking, including vitamins, herbs, eye drops, creams, and over-the-counter medicines.  Any problems you or family members have had with anesthetic medicines.  Any blood disorders you have.  Any surgeries you have had.  Any medical conditions you have.  Whether you are pregnant or may be pregnant. What are the risks? Generally, this is a safe procedure. However, problems may occur, including:  Headache.  Bleeding.  Infection.  Allergic reaction to medicines.  Damage to your nerves.  What happens before the procedure? Staying hydrated Follow instructions from your health care provider about hydration, which may include:  Up to 2 hours before the procedure - you may continue to drink clear liquids, such as water, clear fruit juice, black coffee, and plain tea.  Eating and drinking restrictions Follow instructions from your health care provider about eating and drinking, which may include:  8 hours before the procedure - stop eating heavy meals or foods such as meat, fried foods, or fatty foods.  6 hours before the procedure - stop eating light meals or foods, such as toast or cereal.  6 hours before the procedure - stop drinking milk or drinks that contain milk.  2 hours before the procedure - stop drinking clear liquids.  Medicine  You may be given medicines to lower anxiety.  Ask your health care provider about: ? Changing or stopping your regular medicines. This is especially important if you are taking diabetes medicines or blood thinners. ? Taking medicines such as aspirin and ibuprofen. These medicines can thin your blood. Do not take these medicines before your procedure if your health care  provider instructs you not to. General instructions  Plan to have someone take you home from the hospital or clinic. What happens during the procedure?  You may receive a medicine to help you relax (sedative).  You will be asked to lie on your abdomen.  The   injection site will be cleaned.  A numbing medicine (local anesthetic) will be used to numb the injection site.  A needle will be inserted through your skin into the epidural space. You may feel some discomfort when this happens. An X-ray machine will be used to make sure the needle is put as close as possible to the affected nerve.  A steroid medicine and a local anesthetic will be injected into the epidural space.  The needle will be removed.  A bandage (dressing) will be put over the injection site. What happens after the procedure?  Your blood pressure, heart rate, breathing rate, and blood oxygen level will be monitored until the medicines you were given have worn off.  Your arm or leg may feel weak or numb for a few hours.  The injection site may feel sore.  Do not drive for 24 hours if you received a sedative. This information is not intended to replace advice given to you by your health care provider. Make sure you discuss any questions you have with your health care provider. Document Released: 07/30/2007 Document Revised: 10/04/2015 Document Reviewed: 08/08/2015 [employer] Interactive Patient Education  2017 [employer]  

## 2016-08-29 NOTE — Progress Notes (Signed)
Cardiology Office Note  Date:  08/29/2016   ID:  ALFONZA TOFT, DOB Sep 19, 1932, MRN [id number]  PCP:  [name], MD   Chief Complaint  Patient presents with  . other    AFIB c/o atrial flutter and uneasyness. Meds reviewed verbally with pt.    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 Sees a specialist at [facility] outside the system Who presents for follow-up of his complete heart block, pacer  Patient was at pain clinic for injection in back patient,  held eliquis x 3 days  Patient was told in pain clinic he was in afib and had prolonged qrs   Patient has not been feeling well in general  With episodes of fatigue  And low blood pressure  Recent sleep study, was told he had paroxysmal flutter/fib  Review Of pacemaker download March 2018 showing 107 mode switches , atrial fibrillation longest episode 12 hours     he wonders if his malaise and fatigue could be secondary to paroxysmal arrhythmia periodically has lower blood pressure than normal, could be when he is in atrial fibrillation or flutter  EKG personally reviewed by myself on todays visit Atrial sensed ventricular paced rhythm rate 61 bpm  Other past medical history reviewed  Recent admission to hospital with SOB, found to be in heart block 06/2016 permanent pacemaker implantation (06/19/2016),   presented to the [facility] with c/o left sided numbness weakness and slurred speech. 06/25/2016  intermittent symptoms of left facial and LUE numbness since his initial presentation.  Reports having initial symptoms followed by recurrent symptoms 5 days later with  the same location. Seen by neurology in the hospital  Since his discharge he is regaining strength in his left hand, still with some numbness in the palm of the left hand. Concerned about restarting his treadmill exercise program He has noticed increased heart rate in the appropriate fashion with exertion.  He is on eliquis 2.5 mg twice a day, most recent creatinine 1.11, age 83 years old Prior creatinine 1.46  Lab work reviewed with him in detail Total cholesterol 119, LDL 58, hemoglobin A1c 5.6 TSH 2.2, CR 1.46   total cholesterol 119, LDL 58, TSH 2.2, A1c 5.6, creatinine 1.5 to  PMH:   has a past medical history of Aortic valve disorders; Arthritis; Atrial flutter ([place]) (06/18/2016); Basal cell carcinoma; BBB (bundle branch block); Chronic back pain; Complete heart block (HCC) (06/2016); GERD (gastroesophageal reflux disease); Heart block; History of gout; History of hiatal hernia; Hyperlipidemia; Hypertension; Lyme disease; Migraine; Presence of permanent cardiac pacemaker (06/19/2016); PVC's (premature ventricular contractions); Renal cancer, left ([place]) (2006); Spinal stenosis; Squamous carcinoma; Stroke ([place]); and TIA (transient ischemic attack) (06/14/2016).  PSH:    Past Surgical History:  Procedure Laterality Date  . ANKLE FRACTURE SURGERY Right 1967  . BASAL CELL CARCINOMA EXCISION     "face, nose left shoulder, left arm"  . BIOPSY PROSTATE  2001 & 2003  . CARDIAC CATHETERIZATION  1990's  . FRACTURE SURGERY    . INGUINAL HERNIA REPAIR Left 2012  . INSERT / REPLACE / REMOVE PACEMAKER  06/19/2016  . LAPAROSCOPIC ABLATION RENAL MASS    . PACEMAKER  IMPLANT N/A 06/19/2016   Procedure: Pacemaker Implant;  Surgeon: Deboraha Sprang, MD;  Location: [place] [facility];  Service: Cardiovascular;  Laterality: N/A;  . PROSTATE SURGERY    . SQUAMOUS CELL CARCINOMA EXCISION     "face, nose left shoulder, left arm"  . TONSILLECTOMY AND ADENOIDECTOMY      Current Outpatient Prescriptions   Medication Sig Dispense Refill  . apixaban (ELIQUIS) 2.5 MG TABS tablet Take 1 tablet (2.5 mg total) by mouth 2 (two) times daily. 60 tablet 11  . atorvastatin (LIPITOR) 40 MG tablet TAKE 1 TABLET BY MOUTH EVERY DAY AT 6PM 90 tablet 3  . azithromycin (ZITHROMAX) 500 MG tablet     . B Complex Vitamins (B-COMPLEX/B-12 PO) Take 1 tablet by mouth daily.    Marland Kitchen co-enzyme Q-10 30 MG capsule Take 30 mg by mouth 3 (three) times daily.    Marland Kitchen COLCRYS 0.6 MG tablet Take 0.6 mg by mouth as needed.     . diclofenac sodium (VOLTAREN) 1 % GEL Place 2 g onto the skin daily as needed (PAIN).     Marland Kitchen doxycycline (VIBRA-TABS) 100 MG tablet Take 100 mg by mouth 2 (two) times daily.    Marland Kitchen esomeprazole (NEXIUM) 40 MG capsule Take 40 mg by mouth daily as needed.     . febuxostat (ULORIC) 40 MG tablet Take 10 mg by mouth daily. TAKES 0.25 TABLET  DAILY    . Garlic 10 MG CAPS Take 10 mg by mouth daily.     . hydrALAZINE (APRESOLINE) 25 MG tablet Take 1 tablet (25 mg total) by mouth 3 (three) times daily as needed. (Patient taking differently: Take 25 mg by mouth 3 (three) times daily as needed. ONLY FOR HIGH BP READINGS) 90 tablet 6  . lisinopril (PRINIVIL,ZESTRIL) 10 MG tablet Take 1 tablet (10 mg total) by mouth daily. 30 tablet 1  . Magnesium Oxide 250 MG TABS Take 250 mg by mouth daily.    . metroNIDAZOLE (FLAGYL) 500 MG tablet     . MULTIPLE VITAMIN PO Take 1 tablet by mouth daily.     . Multiple Vitamins-Minerals (PRESERVISION AREDS PO) Take 1 tablet by mouth daily.    . Multiple Vitamins-Minerals (ZINC PO) Take 1 tablet by mouth daily.    . Omega-3 Fatty Acids (FISH OIL) 1000 MG CAPS Take 1,000 mg by mouth daily.     Marland Kitchen OVER THE COUNTER MEDICATION Take 1 tablet by mouth 2 (two) times daily. Supplement for Immune Support for Lyme Disease DIM    . OVER THE COUNTER MEDICATION Take 5 mLs by mouth 3 (three) times daily. Lauracidine Immune Support for Lyme Disease    . OVER THE COUNTER MEDICATION Take 1 tablet by mouth  daily. Betaglucin    . Propylene Glycol-Glycerin (SOOTHE OP) Place 1 drop into both eyes at bedtime.    . rifampin (RIFADIN) 300 MG capsule     . Saccharomyces boulardii (PROBIOTIC) 250 MG CAPS Take 1 tablet by mouth See admin instructions. TAKES 3 HOURS AFTER DOXYCYCLINE    . terazosin (HYTRIN) 1 MG capsule Take 1 mg by mouth at bedtime.    Marland Kitchen testosterone cypionate (DEPOTESTOSTERONE CYPIONATE) 200 MG/ML injection     . traMADol (ULTRAM) 50 MG tablet     . TURMERIC PO Take 1 tablet by mouth 2 (two) times daily.    . vitamin E 400 UNIT capsule Take 400 Units by mouth daily.      Allergies:   Iodine and Penicillins  Social History:  The patient  reports that he has never smoked. He has never used smokeless tobacco. He reports that he drinks about 1.2 oz of alcohol per week . He reports that he does not use drugs.   Family History:   family history includes Aortic stenosis in his mother; Arthritis in his father; Heart attack in his brother; Stroke in his brother.    Review of Systems: Review of Systems  Constitutional: Positive for malaise/fatigue.  Respiratory: Negative.   Cardiovascular: Negative.   Gastrointestinal: Negative.   Musculoskeletal: Negative.   Neurological: Positive for weakness.  Psychiatric/Behavioral: Negative.   All other systems reviewed and are negative.    PHYSICAL EXAM: VS:  BP 140/82 (BP Location: Left Arm, Patient Position: Sitting, Cuff Size: Normal)   Pulse 61   Ht 5\' 7"  (1.702 m)   Wt 169 lb 8 oz (76.9 kg)   BMI 26.55 kg/m  , BMI Body mass index is 26.55 kg/m. GEN: Well nourished, well developed, in no acute distress  HEENT: normal  Neck: no JVD, carotid bruits, or masses Cardiac: RRR; no murmurs, rubs, or gallops,no edema  Respiratory:  clear to auscultation bilaterally, normal work of breathing GI: soft, nontender, nondistended, + BS MS: no deformity or atrophy  Skin: warm and dry, no rash Neuro:  Strength and sensation are intact Psych:  euthymic mood, full affect    Recent Labs: 06/25/2016: ALT 26 06/26/2016: BUN 16; Creatinine, Ser 1.46; Hemoglobin 15.2; Platelets 136; Potassium 4.3; Sodium 137 07/11/2016: TSH 2.230    Lipid Panel Lab Results  Component Value Date   CHOL 119 07/11/2016   HDL 53 07/11/2016   LDLCALC 58 07/11/2016   TRIG 39 07/11/2016      Wt Readings from Last 3 Encounters:  08/29/16 169 lb 8 oz (76.9 kg)  08/29/16 165 lb (74.8 kg)  08/13/16 169 lb (76.7 kg)       ASSESSMENT AND PLAN:  HYPERTENSION, BENIGN - Plan: EKG 12-Lead Recommended he add metoprolol succinate 25 mg daily for rhythm control   Other hyperlipidemia - Plan: EKG 12-Lead Cholesterol is at goal on the current lipid regimen. No changes to the medications were made.  Transient cerebral ischemia, unspecified type - Plan: EKG 12-Lead Currently on anticoagulation   previous event likely from paroxysmal atrial fibrillation  Cardiac pacemaker in situ - Plan: EKG 12-Lead  pacemaker placed by Dr. Caryl Comes He has follow-up with Dr. Caryl Comes for further discussion of his underlying paroxysmal atrial fibrillation .  We'll try rhythm control with metoprolol   may need higher dose metoprolol and or antiarrhythmics if symptoms persist Likely symptomatic from his flutter/fib  Hemiparesis of left dominant side due to non-cerebrovascular etiology [facility]) Hospital records reviewed, has follow-up with neurology  next week  Complete heart block [facility]) Pacemaker placed, denies any near syncope or syncope  Paroxysmal atrial fibrillation [facility]) Long discussion concerning his atrial fibrillation.   will need to try to regulate his arrhythmia Metoprolol for now May need higher dose with antiarrhythmics if he continues to have symptoms  Coronary artery disease involving native coronary artery of native heart without angina pectoris Currently with no symptoms of angina. No further workup at this time. Continue current medication  regimen.   Disposition:   F/U  6 months   Total encounter time more than 25 minutes  Greater than 50% was spent in counseling and coordination of care with the patient    Orders Placed This Encounter  Procedures  . EKG 12-Lead  Signed, Esmond Plants, M.D., Ph.D. 08/29/2016  [place], [place]

## 2016-08-29 NOTE — Telephone Encounter (Signed)
Patient was at pain clinic for injection today in back patient has held eliquis x 3 days    Patient was told in pain clinic he was in afib and had prolonged qrs  Patient has not been feeling well general fatigue denies other sx   Patient wanted to be seen added on to 240 this afternoon did not wish to see nurse at this time will see gollan this afternoon

## 2016-08-29 NOTE — Progress Notes (Signed)
Safety precautions to be maintained throughout the outpatient stay will include: orient to surroundings, keep bed in low position, maintain call bell within reach at all times, provide assistance with transfer out of bed and ambulation.   Heart rhythm on cardiac monitor appears different than rhythm from 06/2016. Patient made aware of this, denies any symptoms. Patient advised to see cardiologist.

## 2016-08-30 ENCOUNTER — Telehealth: Payer: Self-pay

## 2016-08-30 NOTE — Telephone Encounter (Signed)
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

## 2016-09-02 NOTE — Progress Notes (Signed)
PROCEDURE PERFORMED: Lumbar epidural steroid injection under fluroscopic guidance with moderate sedation. L5-S1  CC:  Low back pain with radiation into the bilateral buttocks and some posterior bilateral leg pain  HPI:  [place] presents today for reevaluation last seen several months ago. He presents periodically for low back pain and posterior bilateral leg pain. He has received epidural steroid injections for this in the past and responds favorably to these. His pain is been quite recalcitrant and he has spinal stenosis in addition to degenerative disc disease which causes periodic exacerbations of his low back pain. He generally gets 50-75% relief lasting several months historically with the injections. He presents today with similar findings and recurrence of the same quality characteristic and distribution of pain as previously documented. His lower extremity strength is been good with no problems with bowel or bladder dysfunction. He just has worsening history leg pain with radiation into the posterior legs and calves that seems to be worse with activity. Walking seems to aggravate this more than running. He continues to run avidly.  PMH:   He has had a recent complicated medical situation involving a possible left side upper extremity numbness and tingling associated with what his physicians are calling a right hemispheric TIA with some vascular disease. He also had a history of some intermittent A. fib yet this is return to normal sinus. He was on L [place] but discontinued this on his own accord prior to coming to see [place] for his epidural injection. He has been off of this for 4 days and also reports that he had a pacemaker put in for third-degree heart block.     Of note he has had a recent diagnosis of Lyme disease and has been on multiple antibiotics for coverage for this. He has been afebrile with no evidence of recent arthralgia related to Lyme disease and no other signs of systemic infection at  this time.   Physical Exam:    PERRL, EOMI  Heart RRR   LCTA  Musculoskeletal: Bilateral paraspinous muscle tenderness in the low back. With pain on extension at the low back   Assessment: Chronic low back pain with degenerative disc disease and bilateral L5 sciatica that has recently worsened but has shown positive response to previous epidural steroid injection.  #2 facet arthropathy  #3 myofascial low back pain  #4 cervicalgia with cervical degenerative disc disease history  5. Recent third-degree heart block requiring pacemaker implantation   6. History of recent TIAs causing some transient left upper extremity numbness and tingling without residual motor dysfunction  PLAN:   1. We'll proceed with an L5-S1 epidural steroid as requested by the patient today. We have gone over the risks and benefits of the procedure. He understands these.  2. We'll have return to clinic in approximately 2 months or when necessary as needed for a repeat epidural injection if necessary. Other negative is reasonable that he receive these periodically as they are the only modality that has given him good relief. Unfortunately they have kept him away from any opioid therapy and enable him to continue running and exercising.   3. I've had a conversation with him regarding reinitiating the Eliquis. I feel that this should be restarted tomorrow. I've asked that he touch base with his cardiologist or neurologist about discontinuing it prior to his next injection within the next few months.  Procedure: L5-S1 epidural steroid under fluoroscopic guidance without sedation   Procedure: L5-S1 LESI with fluoroscopic guidance and moderate sedation  NOTE: The risks, benefits, and expectations of the procedure have been discussed and explained to the patient who was understanding and in agreement with suggested treatment plan. No guarantees were made.  DESCRIPTION OF PROCEDURE: Lumbar epidural steroid injection  with EKG, blood pressure, pulse, and pulse oximetry monitoring. The procedure was performed with the patient in the prone position under fluoroscopic guidance. A local anesthetic skin wheal of 1.5% plain lidocaine was performed at the appropriate site overlying L5-S1 after fluoroscopic identifictation  Using strict aseptic technique, I then advanced an 18-gauge Tuohy epidural needle in the midline via loss-of-resistance to saline technique. There was negative aspiration for heme or  CSF.  I then confirmed position with both AP and Lateral fluoroscan. At L5-S1  I injected 2 cc of Isovue getting good epidural spread and I then injected A total of 5 mL of Preservative-Free normal saline mixed with 40 mg of Kenalog and 1cc Ropicaine 0.2 percent was injected incrementally via the  epidurally placed needle. The needle was removed. The patient tolerated the injection well and was convalesced and discharged to home in stable condition. Should the patient have any post procedure difficulty they have been instructed on how to contact us for assistance.       Procedure:  LESI:  NOTE: The risks, benefits, and expectations of the procedure have been discussed and explained to the patient who was understanding and in agreement with suggested treatment plan. No guarantees were made.  DESCRIPTION OF PROCEDURE: Lumbar epidural steroid injection with no Versed per patient request, EKG, blood pressure, pulse, and pulse oximetry monitoring. The procedure was performed with the patient in the prone position under fluoroscopic guidance. A local anesthetic skin wheal of 1.5% plain lidocaine was performed at the L5-S1 site after fluoroscopic identifictation  Using strict aseptic technique, I then advanced an 18-gauge Tuohy epidural needle in the midline via loss-of-resistance  Technique. There was negative aspiration for negative aspiration for heme or  CSF.  I then confirmed position with both AP and Lateral fluoroscan.  A total  of 5 mL of Preservative-Free normal saline with 40 mg of Kenalog and 1cc Ropicaine 0.2 percent was injected incrementally via the  epidurally placed needle. Needle removed. The patient tolerated the injection well.   @Kamari Buch  Andree Elk, MD@

## 2016-09-09 ENCOUNTER — Other Ambulatory Visit: Payer: Self-pay | Admitting: *Deleted

## 2016-09-09 MED ORDER — LISINOPRIL 10 MG PO TABS: 10.0000 mg | ORAL_TABLET | Freq: Every day | ORAL | 3 refills | Status: DC

## 2016-09-16 ENCOUNTER — Other Ambulatory Visit: Payer: Self-pay | Admitting: Cardiovascular Disease

## 2016-09-17 ENCOUNTER — Encounter: Payer: Self-pay | Admitting: Internal Medicine

## 2016-09-17 ENCOUNTER — Ambulatory Visit (INDEPENDENT_AMBULATORY_CARE_PROVIDER_SITE_OTHER): Payer: Medicare Other | Admitting: Internal Medicine

## 2016-09-17 VITALS — BP 148/74 | HR 59 | Ht 67.0 in | Wt 166.0 lb

## 2016-09-17 DIAGNOSIS — I480 Paroxysmal atrial fibrillation: Secondary | ICD-10-CM | POA: Diagnosis not present

## 2016-09-17 DIAGNOSIS — Z950 Presence of cardiac pacemaker: Secondary | ICD-10-CM | POA: Diagnosis not present

## 2016-09-17 DIAGNOSIS — I442 Atrioventricular block, complete: Secondary | ICD-10-CM | POA: Diagnosis not present

## 2016-09-17 LAB — CUP PACEART INCLINIC DEVICE CHECK
Brady Statistic AP VS Percent: 0.01 %
Brady Statistic AS VS Percent: 0.21 %
Brady Statistic RV Percent Paced: 97.62 %
Date Time Interrogation Session: 20180515140903
Implantable Lead Location: 753859
Implantable Lead Model: 5076
Lead Channel Impedance Value: 380 Ohm
Lead Channel Impedance Value: 456 Ohm
Lead Channel Pacing Threshold Amplitude: 0.5 V
Lead Channel Pacing Threshold Pulse Width: 0.4 ms
Lead Channel Setting Pacing Amplitude: 2 V
Lead Channel Setting Pacing Amplitude: 2.5 V
Lead Channel Setting Pacing Pulse Width: 0.4 ms
Lead Channel Setting Sensing Sensitivity: 0.9 mV
MDC IDC LEAD IMPLANT DT: 20180214
MDC IDC LEAD IMPLANT DT: 20180214
MDC IDC LEAD LOCATION: 753860
MDC IDC MSMT BATTERY REMAINING LONGEVITY: 143 mo
MDC IDC MSMT BATTERY VOLTAGE: 3.18 V
MDC IDC MSMT LEADCHNL RA IMPEDANCE VALUE: 342 Ohm
MDC IDC MSMT LEADCHNL RA PACING THRESHOLD AMPLITUDE: 0.5 V
MDC IDC MSMT LEADCHNL RA PACING THRESHOLD PULSEWIDTH: 0.4 ms
MDC IDC MSMT LEADCHNL RA SENSING INTR AMPL: 3.875 mV
MDC IDC MSMT LEADCHNL RV IMPEDANCE VALUE: 494 Ohm
MDC IDC PG IMPLANT DT: 20180214
MDC IDC STAT BRADY AP VP PERCENT: 2.59 %
MDC IDC STAT BRADY AS VP PERCENT: 97.03 %
MDC IDC STAT BRADY RA PERCENT PACED: 1.9 %

## 2016-09-17 MED ORDER — LISINOPRIL 5 MG PO TABS: 5.0000 mg | ORAL_TABLET | Freq: Every day | ORAL | 3 refills | Status: DC

## 2016-09-17 MED ORDER — METOPROLOL SUCCINATE ER 50 MG PO TB24: 50.0000 mg | ORAL_TABLET | Freq: Every day | ORAL | 3 refills | Status: DC

## 2016-09-17 NOTE — Patient Instructions (Signed)
Medication Instructions: - Your physician has recommended you make the following change in your medication:  1) Decrease lisinopril to 5 mg -take one tablet by mouth once daily 2) Increase metoprolol succinate to 50 mg- take one tablet by mouth once daily  Labwork: - none ordered  Procedures/Testing: - none ordered  Follow-Up: - Remote monitoring is used to monitor your Pacemaker of ICD from home. This monitoring reduces the number of office visits required to check your device to one time per year. It allows [place] to keep an eye on the functioning of your device to ensure it is working properly. You are scheduled for a device check from home on 12/17/16. You may send your transmission at any time that day. If you have a wireless device, the transmission will be sent automatically. After your physician reviews your transmission, you will receive a postcard with your next transmission date.  - Your physician wants you to follow-up in: 9 months with Dr. Caryl Comes. You will receive a reminder letter in the mail two months in advance. If you don't receive a letter, please call our office to schedule the follow-up appointment.   Any Additional Special Instructions Will Be Listed Below (If Applicable).     If you need a refill on your cardiac medications before your next appointment, please call your pharmacy.

## 2016-09-17 NOTE — Telephone Encounter (Signed)
Propranolol hcl is listed on med list as take one tab daily as needed. Request states tid as needed. Please advise.

## 2016-09-17 NOTE — Progress Notes (Signed)
Patient Care Team: Jerrol Banana., MD as PCP - General (Family Medicine) Rockey Situ Kathlene November, MD as Consulting Physician (Cardiology) Anne Hahn, MD (Family Medicine) Minna Merritts, MD as Consulting Physician (Cardiology)   HPI  Thomas [name] is a 83 y.o. male Seen in follow-up for pacemaker implanted 2/18 for complete heart block. He also has paroxysmal atrial flutter  on anticoagulation his CHADS-VASc score is greater than or equal to 6 with a prior TIA.  Echocardiogram 2/18 EF 55-60% with normal right-sided function -this represented interval normalization  He is modestly improved. He is having more symptomatic atrial fibrillation and he had anticipated. This is better with metoprolol and when necessary Inderal.  No bleeding issues.  Records and Results Reviewed creatinine in the 1.46-8 range    Past Medical History:  Diagnosis Date  . Aortic valve disorders   . Arthritis   . Atrial flutter ([employer]) 06/18/2016   "AF or AFl; not sure which" (06/23/2016)  . Basal cell carcinoma    "face, nose left shoulder, left arm" (06/19/2016)  . BBB (bundle branch block)    hx right  . Chronic back pain    "neck, thoracic, lower back" (06/19/2016)  . Complete heart block ([place]) 06/2016  . GERD (gastroesophageal reflux disease)   . Heart block    "I've had type I, II Wenke before now" (06/19/2016)  . History of gout   . History of hiatal hernia    "self dx'd" (06/19/2016)  . Hyperlipidemia   . Hypertension   . Lyme disease    "dx'd by me 2003; cx's showed dx 08/2015"  . Migraine    "3-4/year" (06/19/2016)  . Presence of permanent cardiac pacemaker 06/19/2016  . PVC's (premature ventricular contractions)   . Renal cancer, left ([place]) 2006   S/P cryotherapy  . Spinal stenosis    "cervical, 1 thoracic, lumbar" (06/19/2016)  . Squamous carcinoma    "face, nose left shoulder, left arm" (06/19/2016)  . Stroke ([place])   . TIA (transient ischemic attack) 06/14/2016   "I'm  not sure that's what it was" (06/25/2016)    Past Surgical History:  Procedure Laterality Date  . ANKLE FRACTURE SURGERY Right 1967  . BASAL CELL CARCINOMA EXCISION     "face, nose left shoulder, left arm"  . BIOPSY PROSTATE  2001 & 2003  . CARDIAC CATHETERIZATION  1990's  . FRACTURE SURGERY    . INGUINAL HERNIA REPAIR Left 2012  . INSERT / REPLACE / REMOVE PACEMAKER  06/19/2016  . LAPAROSCOPIC ABLATION RENAL MASS    . PACEMAKER IMPLANT N/A 06/19/2016   Procedure: Pacemaker Implant;  Surgeon: Deboraha Sprang, MD;  Location: [facility];  Service: Cardiovascular;  Laterality: N/A;  . PROSTATE SURGERY    . SQUAMOUS CELL CARCINOMA EXCISION     "face, nose left shoulder, left arm"  . TONSILLECTOMY AND ADENOIDECTOMY      Current Outpatient Prescriptions  Medication Sig Dispense Refill  . atorvastatin (LIPITOR) 40 MG tablet TAKE 1 TABLET BY MOUTH EVERY DAY AT 6PM 90 tablet 3  . azithromycin (ZITHROMAX) 500 MG tablet Take 500 mg by mouth daily.     . B Complex Vitamins (B-COMPLEX/B-12 PO) Take 1 tablet by mouth daily.    . clarithromycin (BIAXIN) 500 MG tablet Take 500 mg by mouth daily.    Marland Kitchen co-enzyme Q-10 30 MG capsule Take 30 mg by mouth 3 (three) times daily.    Marland Kitchen COLCRYS 0.6 MG tablet  Take 0.6 mg by mouth as needed.     . diclofenac sodium (VOLTAREN) 1 % GEL Place 2 g onto the skin daily as needed (PAIN).     Marland Kitchen esomeprazole (NEXIUM) 40 MG capsule Take 40 mg by mouth daily as needed.     . febuxostat (ULORIC) 40 MG tablet Take 10 mg by mouth daily. TAKES 0.25 TABLET  DAILY    . Garlic 10 MG CAPS Take 10 mg by mouth daily.     . hydrALAZINE (APRESOLINE) 25 MG tablet Take 1 tablet (25 mg total) by mouth 3 (three) times daily as needed. (Patient taking differently: Take 25 mg by mouth 3 (three) times daily as needed. ONLY FOR HIGH BP READINGS) 90 tablet 6  . lisinopril (PRINIVIL,ZESTRIL) 10 MG tablet Take 1 tablet (10 mg total) by mouth daily. 90 tablet 3  . Magnesium Oxide 250 MG  TABS Take 250 mg by mouth daily.    . metoprolol succinate (TOPROL-XL) 25 MG 24 hr tablet Take 1 tablet (25 mg total) by mouth daily. 90 tablet 3  . metroNIDAZOLE (FLAGYL) 500 MG tablet Takes 1 tablet twice daily for 2 days every week.    . MULTIPLE VITAMIN PO Take 1 tablet by mouth daily.     . Multiple Vitamins-Minerals (PRESERVISION AREDS PO) Take 1 tablet by mouth daily.    . Multiple Vitamins-Minerals (ZINC PO) Take 1 tablet by mouth daily.    . Omega-3 Fatty Acids (FISH OIL) 1000 MG CAPS Take 1,000 mg by mouth daily.     Marland Kitchen OVER THE COUNTER MEDICATION Take 1 tablet by mouth 2 (two) times daily. Supplement for Immune Support for Lyme Disease DIM    . OVER THE COUNTER MEDICATION Take 5 mLs by mouth 3 (three) times daily. Lauracidine Immune Support for Lyme Disease    . OVER THE COUNTER MEDICATION Take 1 tablet by mouth daily. Betaglucin    . propranolol (INDERAL) 20 MG tablet Take 20 mg by mouth daily as needed.    Marland Kitchen Propylene Glycol-Glycerin (SOOTHE OP) Place 1 drop into both eyes at bedtime.    . rifampin (RIFADIN) 300 MG capsule Take 300 mg by mouth daily.     . Saccharomyces boulardii (PROBIOTIC) 250 MG CAPS Take 1 tablet by mouth See admin instructions. TAKES 3 HOURS AFTER DOXYCYCLINE    . terazosin (HYTRIN) 1 MG capsule Take 1 mg by mouth at bedtime.    Marland Kitchen testosterone cypionate (DEPOTESTOSTERONE CYPIONATE) 200 MG/ML injection Inject into the muscle every 28 (twenty-eight) days.     . traMADol (ULTRAM) 50 MG tablet Take 50 mg by mouth every 6 (six) hours as needed.     . TURMERIC PO Take 1 tablet by mouth 2 (two) times daily.    . vitamin E 400 UNIT capsule Take 400 Units by mouth daily.    Marland Kitchen ELIQUIS 2.5 MG TABS tablet TAKE 1 TABLET BY MOUTH TWICE A DAY. 60 tablet 3   No current facility-administered medications for this visit.     Allergies  Allergen Reactions  . Iodine Hives    IVP contrast  . Penicillins Itching    ITCHY FEELING IN FINGERS Has patient had a PCN reaction  causing immediate rash, facial/tongue/throat swelling, SOB or lightheadedness with hypotension: No Has patient had a PCN reaction causing severe rash involving mucus membranes or skin necrosis: No Has patient had a PCN reaction that required hospitalization No Has patient had a PCN reaction occurring within the last 10 years: No If  all of the above answers are "NO", then may proceed with Cephalosporin use.       Review of Systems negative except from HPI and PMH  Physical Exam BP (!) 148/74 (BP Location: Left Arm, Patient Position: Sitting, Cuff Size: Normal)   Pulse (!) 59   Ht 5\' 7"  (1.702 m)   Wt 166 lb (75.3 kg)   BMI 26.00 kg/m  Well developed and well nourished in no acute distress HENT normal E scleral and icterus clear Neck Supple JVP flat; carotids brisk and full Clear to ausculation Device pocket well healed; without hematoma or erythema.  There is no tethering  Regular rate and rhythm, no murmurs gallops or rub Soft with active bowel sounds No clubbing cyanosis  Edema Alert and oriented, grossly normal motor and sensory function Skin Warm and Dry  ECG personally reviewed sinus rhythm with P-synchronous/ AV  pacing   Assessment and Plan:  Atrial fibrillation-paroxysmal  TIA  Coronary artery disease-remote  Complete heart block    Hypertension  Renal insufficiency grade 3  Sleep disordered breathing  He is having symptomatic atrial fibrillation which is better with metoprolol and when necessary Inderal. He has no intrinsic conduction . Atrial fibrillation rates have been slower on the higher doses of medications  History was so he is in A. fib mostly slower and so excepthis sinus  On Anticoagulation;  No bleeding issues   We will increase his metoprolol from 25--50 to augment rate control and decrease his lisinopril  50--25 to minimize the issues of hypertension/hypotension given his history of variable blood pressure   Current medicines are  reviewed at length with the patient today .  The patient does have concerns regarding medicines As above

## 2016-09-17 NOTE — Telephone Encounter (Signed)
Pt is seeing Dr. Caryl Comes today.  See if he can make this recommendation.

## 2016-10-21 ENCOUNTER — Ambulatory Visit: Payer: Medicare Other | Admitting: Family Medicine

## 2016-10-21 NOTE — Progress Notes (Deleted)
Patient: Thomas [name] Male    DOB: 1932/10/28   83 y.o.   MRN: [id number] Visit Date: 10/21/2016  Today's Provider: [name], MD   No chief complaint on file.  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 He was hospitalized for a stroke in February 2018. He has since followed up with cardiology and neurology. Per Dr. Rockey Situ, his recent stroke was of unknown etiology. He has minimal carotid disease, and small vessel cerebral disease per MRI/ct. Per neurology, pt was symptomatic for OSA. Sleep study was normal, and was negative. Neurologist recommended weight loss and possible ENT referral.  Allergies  Allergen Reactions  . Iodine Hives    IVP contrast  . Penicillins Itching    ITCHY FEELING IN FINGERS Has patient had a PCN reaction causing immediate rash, facial/tongue/throat swelling, SOB or lightheadedness with hypotension: No Has patient had a PCN reaction causing severe rash involving mucus membranes or skin necrosis: No Has patient had a PCN reaction that required hospitalization No Has patient had a PCN reaction occurring within the last 10 years: No If all of the above answers are "NO", then may proceed with Cephalosporin use.      Current Outpatient Prescriptions:  .  atorvastatin (LIPITOR) 40 MG tablet, TAKE 1 TABLET BY MOUTH EVERY DAY AT 6PM, Disp: 90 tablet, Rfl: 3 .  azithromycin (ZITHROMAX) 500 MG tablet, Take 500 mg by mouth daily. , Disp: , Rfl:  .  B Complex Vitamins (B-COMPLEX/B-12 PO), Take 1 tablet by mouth daily., Disp: , Rfl:  .  clarithromycin (BIAXIN) 500 MG tablet, Take 500 mg by mouth daily., Disp: , Rfl:  .  co-enzyme Q-10 30 MG capsule, Take 30 mg by mouth 3 (three) times daily., Disp: , Rfl:  .  COLCRYS 0.6 MG tablet, Take 0.6 mg by mouth as needed. , Disp: , Rfl:  .  diclofenac sodium (VOLTAREN) 1 % GEL, Place 2 g onto the skin daily as needed (PAIN). , Disp: , Rfl:  .  ELIQUIS 2.5 MG TABS tablet, TAKE 1 TABLET BY  MOUTH TWICE A DAY., Disp: 60 tablet, Rfl: 3 .  esomeprazole (NEXIUM) 40 MG capsule, Take 40 mg by mouth daily as needed. , Disp: , Rfl:  .  febuxostat (ULORIC) 40 MG tablet, Take 10 mg by mouth daily. TAKES 0.25 TABLET  DAILY, Disp: , Rfl:  .  Garlic 10 MG CAPS, Take 10 mg by mouth daily. , Disp: , Rfl:  .  hydrALAZINE (APRESOLINE) 25 MG tablet, Take 1 tablet (25 mg total) by mouth 3 (three) times daily as needed. (Patient taking differently: Take 25 mg by mouth 3 (three) times daily as needed. ONLY FOR HIGH BP READINGS), Disp: 90 tablet, Rfl: 6 .  lisinopril (PRINIVIL,ZESTRIL) 5 MG tablet, Take 1 tablet (5 mg total) by mouth at bedtime., Disp: 90 tablet, Rfl: 3 .  Magnesium Oxide 250 MG TABS, Take 250 mg by mouth daily., Disp: , Rfl:  .  metoprolol succinate (TOPROL-XL) 50 MG 24 hr tablet, Take 1 tablet (50 mg total) by mouth daily. Take with or immediately following a meal., Disp: 90 tablet, Rfl: 3 .  metroNIDAZOLE (FLAGYL) 500 MG tablet, Takes 1 tablet twice daily for 2 days every week., Disp: , Rfl:  .  MULTIPLE VITAMIN PO, Take 1 tablet by mouth daily. , Disp: , Rfl:  .  Multiple Vitamins-Minerals (PRESERVISION AREDS PO), Take 1 tablet  by mouth daily., Disp: , Rfl:  .  Multiple Vitamins-Minerals (ZINC PO), Take 1 tablet by mouth daily., Disp: , Rfl:  .  Omega-3 Fatty Acids (FISH OIL) 1000 MG CAPS, Take 1,000 mg by mouth daily. , Disp: , Rfl:  .  OVER THE COUNTER MEDICATION, Take 1 tablet by mouth 2 (two) times daily. Supplement for Immune Support for Lyme Disease DIM, Disp: , Rfl:  .  OVER THE COUNTER MEDICATION, Take 5 mLs by mouth 3 (three) times daily. Lauracidine Immune Support for Lyme Disease, Disp: , Rfl:  .  OVER THE COUNTER MEDICATION, Take 1 tablet by mouth daily. Betaglucin, Disp: , Rfl:  .  propranolol (INDERAL) 20 MG tablet, Take 20 mg by mouth daily as needed., Disp: , Rfl:  .  propranolol (INDERAL) 40 MG tablet, TAKE ONE (1) TABLET THREE (3) TIMES EACHDAY AS NEEDED, Disp: 270  tablet, Rfl: 1 .  Propylene Glycol-Glycerin (SOOTHE OP), Place 1 drop into both eyes at bedtime., Disp: , Rfl:  .  rifampin (RIFADIN) 300 MG capsule, Take 300 mg by mouth daily. , Disp: , Rfl:  .  Saccharomyces boulardii (PROBIOTIC) 250 MG CAPS, Take 1 tablet by mouth See admin instructions. TAKES 3 HOURS AFTER DOXYCYCLINE, Disp: , Rfl:  .  terazosin (HYTRIN) 1 MG capsule, Take 1 mg by mouth at bedtime., Disp: , Rfl:  .  testosterone cypionate (DEPOTESTOSTERONE CYPIONATE) 200 MG/ML injection, Inject into the muscle every 28 (twenty-eight) days. , Disp: , Rfl:  .  traMADol (ULTRAM) 50 MG tablet, Take 50 mg by mouth every 6 (six) hours as needed. , Disp: , Rfl:  .  TURMERIC PO, Take 1 tablet by mouth 2 (two) times daily., Disp: , Rfl:  .  vitamin E 400 UNIT capsule, Take 400 Units by mouth daily., Disp: , Rfl:   Review of Systems  Social History  Substance Use Topics  . Smoking status: Never Smoker  . Smokeless tobacco: Never Used  . Alcohol use 1.2 oz/week    2 Glasses of wine per week     Comment: occasionally   Objective:   There were no vitals taken for this visit. There were no vitals filed for this visit.   Physical Exam      Assessment & Plan:           [name], MD  [facility]

## 2016-10-23 ENCOUNTER — Telehealth: Payer: Self-pay | Admitting: Family Medicine

## 2016-10-31 DIAGNOSIS — I442 Atrioventricular block, complete: Secondary | ICD-10-CM | POA: Diagnosis not present

## 2016-10-31 DIAGNOSIS — I639 Cerebral infarction, unspecified: Secondary | ICD-10-CM | POA: Diagnosis not present

## 2016-10-31 DIAGNOSIS — R5383 Other fatigue: Secondary | ICD-10-CM | POA: Diagnosis not present

## 2016-11-01 LAB — COMPREHENSIVE METABOLIC PANEL
A/G RATIO: 1.8 (ref 1.2–2.2)
ALT: 29 IU/L (ref 0–44)
AST: 34 IU/L (ref 0–40)
Albumin: 4.3 g/dL (ref 3.5–4.7)
Alkaline Phosphatase: 82 IU/L (ref 39–117)
BUN/Creatinine Ratio: 16 (ref 10–24)
BUN: 23 mg/dL (ref 8–27)
Bilirubin Total: 0.6 mg/dL (ref 0.0–1.2)
CALCIUM: 9.6 mg/dL (ref 8.6–10.2)
CO2: 24 mmol/L (ref 20–29)
CREATININE: 1.46 mg/dL — AB (ref 0.76–1.27)
Chloride: 98 mmol/L (ref 96–106)
GFR calc non Af Amer: 44 mL/min/{1.73_m2} — ABNORMAL LOW (ref >59)
GFR, EST AFRICAN AMERICAN: 51 mL/min/{1.73_m2} — AB (ref >59)
GLOBULIN, TOTAL: 2.4 g/dL (ref 1.5–4.5)
Glucose: 107 mg/dL — ABNORMAL HIGH (ref 65–99)
POTASSIUM: 4.9 mmol/L (ref 3.5–5.2)
Sodium: 134 mmol/L (ref 134–144)
TOTAL PROTEIN: 6.7 g/dL (ref 6.0–8.5)

## 2016-11-01 LAB — CBC WITH DIFFERENTIAL/PLATELET
BASOS: 1 %
Basophils Absolute: 0 10*3/uL (ref 0.0–0.2)
EOS (ABSOLUTE): 0.2 10*3/uL (ref 0.0–0.4)
EOS: 3 %
HEMATOCRIT: 42.4 % (ref 37.5–51.0)
Hemoglobin: 14.4 g/dL (ref 13.0–17.7)
IMMATURE GRANS (ABS): 0 10*3/uL (ref 0.0–0.1)
IMMATURE GRANULOCYTES: 0 %
Lymphocytes Absolute: 2.7 10*3/uL (ref 0.7–3.1)
Lymphs: 42 %
MCH: 28.7 pg (ref 26.6–33.0)
MCHC: 34 g/dL (ref 31.5–35.7)
MCV: 85 fL (ref 79–97)
MONOCYTES: 16 %
Monocytes Absolute: 1 10*3/uL — ABNORMAL HIGH (ref 0.1–0.9)
NEUTROS PCT: 38 %
Neutrophils Absolute: 2.4 10*3/uL (ref 1.4–7.0)
PLATELETS: 192 10*3/uL (ref 150–379)
RBC: 5.02 x10E6/uL (ref 4.14–5.80)
RDW: 16.5 % — ABNORMAL HIGH (ref 12.3–15.4)
WBC: 6.3 10*3/uL (ref 3.4–10.8)

## 2016-11-26 ENCOUNTER — Ambulatory Visit (INDEPENDENT_AMBULATORY_CARE_PROVIDER_SITE_OTHER): Payer: Medicare Other

## 2016-11-26 VITALS — BP 128/66 | HR 64 | Temp 97.8°F | Ht 67.0 in | Wt 165.6 lb

## 2016-11-26 DIAGNOSIS — Z0000 Encounter for general adult medical examination without abnormal findings: Secondary | ICD-10-CM

## 2016-11-26 NOTE — Progress Notes (Signed)
Subjective:   Thomas [name] is a 83 y.o. male who presents for Medicare Annual/Subsequent preventive examination.  Review of Systems:  N/A        Objective:    Vitals: BP 128/66 (BP Location: Left Arm)   Pulse 64   Temp 97.8 F (36.6 C) (Oral)   Ht 5\' 7"  (1.702 m)   Wt 165 lb 9.6 oz (75.1 kg)   BMI 25.94 kg/m   Body mass index is 25.94 kg/m.  Tobacco History  Smoking Status  . Never Smoker  Smokeless Tobacco  . Never Used     Counseling given: Not Answered   Past Medical History:  Diagnosis Date  . Aortic valve disorders   . Arthritis   . Atrial flutter ([place]) 06/18/2016   "AF or AFl; not sure which" (06/23/2016)  . Basal cell carcinoma    "face, nose left shoulder, left arm" (06/19/2016)  . BBB (bundle branch block)    hx right  . Chronic back pain    "neck, thoracic, lower back" (06/19/2016)  . Complete heart block ([place]) 06/2016  . GERD (gastroesophageal reflux disease)   . Heart block    "I've had type I, II Wenke before now" (06/19/2016)  . History of gout   . History of hiatal hernia    "self dx'd" (06/19/2016)  . Hyperlipidemia   . Hypertension   . Lyme disease    "dx'd by me 2003; cx's showed dx 08/2015"  . Migraine    "3-4/year" (06/19/2016)  . Presence of permanent cardiac pacemaker 06/19/2016  . PVC's (premature ventricular contractions)   . Renal cancer, left ([place]) 2006   S/P cryotherapy  . Spinal stenosis    "cervical, 1 thoracic, lumbar" (06/19/2016)  . Squamous carcinoma    "face, nose left shoulder, left arm" (06/19/2016)  . Stroke ([place])   . TIA (transient ischemic attack) 06/14/2016   "I'm not sure that's what it was" (06/25/2016)   Past Surgical History:  Procedure Laterality Date  . ANKLE FRACTURE SURGERY Right 1967  . BASAL CELL CARCINOMA EXCISION     "face, nose left shoulder, left arm"  . BIOPSY PROSTATE  2001 & 2003  . CARDIAC CATHETERIZATION  1990's  . FRACTURE SURGERY    . INGUINAL HERNIA REPAIR Left 2012  . INSERT /  REPLACE / REMOVE PACEMAKER  06/19/2016  . LAPAROSCOPIC ABLATION RENAL MASS    . PACEMAKER IMPLANT N/A 06/19/2016   Procedure: Pacemaker Implant;  Surgeon: Deboraha Sprang, MD;  Location: [facility];  Service: Cardiovascular;  Laterality: N/A;  . PROSTATE SURGERY    . SQUAMOUS CELL CARCINOMA EXCISION     "face, nose left shoulder, left arm"  . TONSILLECTOMY AND ADENOIDECTOMY     Family History  Problem Relation Age of Onset  . Heart attack Brother   . Stroke Brother   . Aortic stenosis Mother   . Arthritis Father    History  Sexual Activity  . Sexual activity: Yes    Outpatient Encounter Prescriptions as of 11/26/2016  Medication Sig  . atorvastatin (LIPITOR) 40 MG tablet TAKE 1 TABLET BY MOUTH EVERY DAY AT 6PM  . B Complex Vitamins (B-COMPLEX/B-12 PO) Take 1 tablet by mouth daily.  . clarithromycin (BIAXIN) 500 MG tablet Take 500 mg by mouth daily.  Marland Kitchen co-enzyme Q-10 30 MG capsule Take 30 mg by mouth 3 (three) times daily.  Marland Kitchen COLCRYS 0.6 MG tablet Take 0.6 mg by mouth as needed.   . diclofenac  sodium (VOLTAREN) 1 % GEL Place 2 g onto the skin daily as needed (PAIN).   Marland Kitchen ELIQUIS 2.5 MG TABS tablet TAKE 1 TABLET BY MOUTH TWICE A DAY.  Marland Kitchen esomeprazole (NEXIUM) 40 MG capsule Take 40 mg by mouth daily as needed.   . febuxostat (ULORIC) 40 MG tablet Take 10 mg by mouth daily. TAKES 0.25 TABLET  DAILY  . Garlic 10 MG CAPS Take 10 mg by mouth daily.   . hydrALAZINE (APRESOLINE) 25 MG tablet Take 1 tablet (25 mg total) by mouth 3 (three) times daily as needed. (Patient taking differently: Take 25 mg by mouth 3 (three) times daily as needed. ONLY FOR HIGH BP READINGS)  . lisinopril (PRINIVIL,ZESTRIL) 5 MG tablet Take 1 tablet (5 mg total) by mouth at bedtime. (Patient taking differently: Take 5 mg by mouth daily. )  . Magnesium Oxide 250 MG TABS Take 250 mg by mouth daily.  . metoprolol succinate (TOPROL-XL) 50 MG 24 hr tablet Take 1 tablet (50 mg total) by mouth daily. Take with or  immediately following a meal.  . metroNIDAZOLE (FLAGYL) 500 MG tablet Takes 1 tablet twice daily for 2 days every week.  . MULTIPLE VITAMIN PO Take 1 tablet by mouth daily.   . Multiple Vitamins-Minerals (PRESERVISION AREDS PO) Take 1 tablet by mouth daily.  . Multiple Vitamins-Minerals (ZINC PO) Take 1 tablet by mouth daily.  . Omega-3 Fatty Acids (FISH OIL) 1000 MG CAPS Take 1,000 mg by mouth daily.   Marland Kitchen OVER THE COUNTER MEDICATION Take 1 tablet by mouth 2 (two) times daily. Supplement for Immune Support for Lyme Disease DIM  . OVER THE COUNTER MEDICATION Take 5 mLs by mouth 3 (three) times daily. Lauracidine Immune Support for Lyme Disease  . OVER THE COUNTER MEDICATION Take 1 tablet by mouth daily. Betaglucin  . propranolol (INDERAL) 20 MG tablet Take 20 mg by mouth daily as needed.   . propranolol (INDERAL) 40 MG tablet TAKE ONE (1) TABLET THREE (3) TIMES EACHDAY AS NEEDED  . Propylene Glycol-Glycerin (SOOTHE OP) Place 1 drop into both eyes at bedtime.  . rifampin (RIFADIN) 300 MG capsule Take 300 mg by mouth 2 (two) times daily.   . Saccharomyces boulardii (PROBIOTIC) 250 MG CAPS Take 1 tablet by mouth See admin instructions. TAKES 3 HOURS AFTER DOXYCYCLINE  . terazosin (HYTRIN) 1 MG capsule Take 1 mg by mouth at bedtime.  Marland Kitchen testosterone cypionate (DEPOTESTOSTERONE CYPIONATE) 200 MG/ML injection Inject into the muscle every 28 (twenty-eight) days.   . traMADol (ULTRAM) 50 MG tablet Take 50 mg by mouth every 6 (six) hours as needed.   . TURMERIC PO Take 1 tablet by mouth 2 (two) times daily.  . vitamin E 400 UNIT capsule Take 400 Units by mouth daily.  . [DISCONTINUED] azithromycin (ZITHROMAX) 500 MG tablet Take 500 mg by mouth daily.    No facility-administered encounter medications on file as of 11/26/2016.     Activities of Daily Living In your present state of health, do you have any difficulty performing the following activities: 11/26/2016 06/25/2016  Hearing? Y N  Vision? Y N    Difficulty concentrating or making decisions? N N  Walking or climbing stairs? N N  Dressing or bathing? N N  Doing errands, shopping? N N  Preparing Food and eating ? N -  Using the Toilet? N -  In the past six months, have you accidently leaked urine? Y -  Do you have problems with loss of bowel control?  N -  Managing your Medications? N -  Managing your Finances? N -  Housekeeping or managing your Housekeeping? N -  Some recent data might be hidden    Patient Care Team: Jerrol Banana., MD as PCP - General (Family Medicine) Rockey Situ, Kathlene November, MD as Consulting Physician (Cardiology) Anne Hahn, MD (Family Medicine) Minna Merritts, MD as Consulting Physician (Cardiology)   Assessment:     Exercise Activities and Dietary recommendations Current Exercise Habits: Home exercise routine, Type of exercise: Other - see comments (running, knee bends), Time (Minutes): 30, Frequency (Times/Week): 7, Weekly Exercise (Minutes/Week): 210, Intensity: Mild, Exercise limited by: Other - see comments (recovering after placement of pacemaker)  Goals    . Increase water intake          Recommend increasing water intake to 7-8 glasses of water a day.      Fall Risk Fall Risk  11/26/2016 08/29/2016 08/13/2016 02/15/2016 08/08/2015  Falls in the past year? No No No No No   Depression Screen PHQ 2/9 Scores 11/26/2016 08/29/2016 08/08/2015 03/21/2015  PHQ - 2 Score 0 0 0 0    Cognitive Function        Immunization History  Administered Date(s) Administered  . Influenza, High Dose Seasonal PF 02/07/2015   Screening Tests Health Maintenance  Topic Date Due  . TETANUS/TDAP  09/30/1953  . PNA vac Low Risk Adult (1 of 2 - PCV13) 10/01/1999  . INFLUENZA VACCINE  12/04/2016      Plan:  I have personally reviewed and addressed the Medicare Annual Wellness questionnaire and have noted the following in the patient's chart:  A. Medical and social history B. Use of alcohol, tobacco  or illicit drugs  C. Current medications and supplements D. Functional ability and status E.  Nutritional status F.  Physical activity G. Advance directives H. List of other physicians I.  Hospitalizations, surgeries, and ER visits in previous 12 months J.  [place] such as hearing and vision if needed, cognitive and depression L. Referrals and appointments - none  In addition, I have reviewed and discussed with patient certain preventive protocols, quality metrics, and best practice recommendations. A written personalized care plan for preventive services as well as general preventive health recommendations were provided to patient.  See attached scanned questionnaire for additional information.   Signed,  Fabio Neighbors, LPN Nurse Health Advisor   MD Recommendations: None, pt declined Prevnar 13 and tetanus vaccines today due to current medication and low immunity.

## 2016-11-26 NOTE — Patient Instructions (Signed)
[name] , Thank you for taking time to come for your Medicare Wellness Visit. I appreciate your ongoing commitment to your health goals. Please review the following plan we discussed and let me know if I can assist you in the future.   Screening recommendations/referrals: Colonoscopy: N/A Recommended yearly ophthalmology/optometry visit for glaucoma screening and checkup Recommended yearly dental visit for hygiene and checkup  Vaccinations: Influenza vaccine: due 01/2017 Pneumococcal vaccine: unable to take now due to current medication Tdap vaccine: unable to take now due to current medication  Shingles vaccine: unable to take now due to current medication   Advanced directives: Please bring a copy of your POA (Power of [place]) and/or Living Will to your next appointment.   Conditions/risks identified:Recommend increasing water intake to 7-8 glasses of water a day.   Next appointment: None, need to schedule 1 year AWV.  Preventive Care 51 Years and Older, Male Preventive care refers to lifestyle choices and visits with your health care provider that can promote health and wellness. What does preventive care include?  A yearly physical exam. This is also called an annual well check.  Dental exams once or twice a year.  Routine eye exams. Ask your health care provider how often you should have your eyes checked.  Personal lifestyle choices, including:  Daily care of your teeth and gums.  Regular physical activity.  Eating a healthy diet.  Avoiding tobacco and drug use.  Limiting alcohol use.  Practicing safe sex.  Taking low doses of aspirin every day.  Taking vitamin and mineral supplements as recommended by your health care provider. What happens during an annual well check? The services and screenings done by your health care provider during your annual well check will depend on your age, overall health, lifestyle risk factors, and family history of  disease. Counseling  Your health care provider may ask you questions about your:  Alcohol use.  Tobacco use.  Drug use.  Emotional well-being.  Home and relationship well-being.  Sexual activity.  Eating habits.  History of falls.  Memory and ability to understand (cognition).  Work and work Statistician. Screening  You may have the following tests or measurements:  Height, weight, and BMI.  Blood pressure.  Lipid and cholesterol levels. These may be checked every 5 years, or more frequently if you are over 71 years old.  Skin check.  Lung cancer screening. You may have this screening every year starting at age 8 if you have a 30-pack-year history of smoking and currently smoke or have quit within the past 15 years.  Fecal occult blood test (FOBT) of the stool. You may have this test every year starting at age 74.  Flexible sigmoidoscopy or colonoscopy. You may have a sigmoidoscopy every 5 years or a colonoscopy every 10 years starting at age 44.  Prostate cancer screening. Recommendations will vary depending on your family history and other risks.  Hepatitis C blood test.  Hepatitis B blood test.  Sexually transmitted disease (STD) testing.  Diabetes screening. This is done by checking your blood sugar (glucose) after you have not eaten for a while (fasting). You may have this done every 1-3 years.  Abdominal aortic aneurysm (AAA) screening. You may need this if you are a current or former smoker.  Osteoporosis. You may be screened starting at age 70 if you are at high risk. Talk with your health care provider about your test results, treatment options, and if necessary, the need for more tests. Vaccines  Your health care provider may recommend certain vaccines, such as:  Influenza vaccine. This is recommended every year.  Tetanus, diphtheria, and acellular pertussis (Tdap, Td) vaccine. You may need a Td booster every 10 years.  Zoster vaccine. You may  need this after age 54.  Pneumococcal 13-valent conjugate (PCV13) vaccine. One dose is recommended after age 45.  Pneumococcal polysaccharide (PPSV23) vaccine. One dose is recommended after age 101. Talk to your health care provider about which screenings and vaccines you need and how often you need them. This information is not intended to replace advice given to you by your health care provider. Make sure you discuss any questions you have with your health care provider. Document Released: 05/19/2015 Document Revised: 01/10/2016 Document Reviewed: 02/21/2015 Elsevier Interactive Patient Education  2017 [place] Prevention in the Home Falls can cause injuries. They can happen to people of all ages. There are many things you can do to make your home safe and to help prevent falls. What can I do on the outside of my home?  Regularly fix the edges of walkways and driveways and fix any cracks.  Remove anything that might make you trip as you walk through a door, such as a raised step or threshold.  Trim any bushes or trees on the path to your home.  Use bright outdoor lighting.  Clear any walking paths of anything that might make someone trip, such as rocks or tools.  Regularly check to see if handrails are loose or broken. Make sure that both sides of any steps have handrails.  Any raised decks and porches should have guardrails on the edges.  Have any leaves, snow, or ice cleared regularly.  Use sand or salt on walking paths during winter.  Clean up any spills in your garage right away. This includes oil or grease spills. What can I do in the bathroom?  Use night lights.  Install grab bars by the toilet and in the tub and shower. Do not use towel bars as grab bars.  Use non-skid mats or decals in the tub or shower.  If you need to sit down in the shower, use a plastic, non-slip stool.  Keep the floor dry. Clean up any water that spills on the floor as soon as it  happens.  Remove soap buildup in the tub or shower regularly.  Attach bath mats securely with double-sided non-slip rug tape.  Do not have throw rugs and other things on the floor that can make you trip. What can I do in the bedroom?  Use night lights.  Make sure that you have a light by your bed that is easy to reach.  Do not use any sheets or blankets that are too big for your bed. They should not hang down onto the floor.  Have a firm chair that has side arms. You can use this for support while you get dressed.  Do not have throw rugs and other things on the floor that can make you trip. What can I do in the kitchen?  Clean up any spills right away.  Avoid walking on wet floors.  Keep items that you use a lot in easy-to-reach places.  If you need to reach something above you, use a strong step stool that has a grab bar.  Keep electrical cords out of the way.  Do not use floor polish or wax that makes floors slippery. If you must use wax, use non-skid floor wax.  Do  not have throw rugs and other things on the floor that can make you trip. What can I do with my stairs?  Do not leave any items on the stairs.  Make sure that there are handrails on both sides of the stairs and use them. Fix handrails that are broken or loose. Make sure that handrails are as long as the stairways.  Check any carpeting to make sure that it is firmly attached to the stairs. Fix any carpet that is loose or worn.  Avoid having throw rugs at the top or bottom of the stairs. If you do have throw rugs, attach them to the floor with carpet tape.  Make sure that you have a light switch at the top of the stairs and the bottom of the stairs. If you do not have them, ask someone to add them for you. What else can I do to help prevent falls?  Wear shoes that:  Do not have high heels.  Have rubber bottoms.  Are comfortable and fit you well.  Are closed at the toe. Do not wear sandals.  If you  use a stepladder:  Make sure that it is fully opened. Do not climb a closed stepladder.  Make sure that both sides of the stepladder are locked into place.  Ask someone to hold it for you, if possible.  Clearly mark and make sure that you can see:  Any grab bars or handrails.  First and last steps.  Where the edge of each step is.  Use tools that help you move around (mobility aids) if they are needed. These include:  Canes.  Walkers.  Scooters.  Crutches.  Turn on the lights when you go into a dark area. Replace any light bulbs as soon as they burn out.  Set up your furniture so you have a clear path. Avoid moving your furniture around.  If any of your floors are uneven, fix them.  If there are any pets around you, be aware of where they are.  Review your medicines with your doctor. Some medicines can make you feel dizzy. This can increase your chance of falling. Ask your doctor what other things that you can do to help prevent falls. This information is not intended to replace advice given to you by your health care provider. Make sure you discuss any questions you have with your health care provider. Document Released: 02/16/2009 Document Revised: 09/28/2015 Document Reviewed: 05/27/2014 Elsevier Interactive Patient Education  2017 [employer].

## 2016-11-28 ENCOUNTER — Ambulatory Visit: Payer: Self-pay

## 2016-12-11 ENCOUNTER — Other Ambulatory Visit: Payer: Self-pay

## 2016-12-11 NOTE — Telephone Encounter (Signed)
Refill request from [facility] for [facility], we have not refilled this in years. Want to see if patient is trying to restart now, is he having a flare up? Has someone been filling this medication for him recently?-aa

## 2016-12-12 MED ORDER — COLCHICINE 0.6 MG PO TABS: 0.6000 mg | ORAL_TABLET | ORAL | 0 refills | Status: DC | PRN

## 2016-12-12 NOTE — Telephone Encounter (Signed)
They want it sent to local [place] per his wife that is all she told me.-aa

## 2016-12-12 NOTE — Telephone Encounter (Signed)
OK to send prescription, but does he want [place] to a pharmacy there?

## 2016-12-12 NOTE — Telephone Encounter (Signed)
Spoke with patient's wife and she states patient Takes this medication as needed for flare up. He currently has a flare up in his ankle[place] Can we refill this for the patient? Sent to [facility].-aa

## 2016-12-17 ENCOUNTER — Encounter: Payer: Medicare Other | Admitting: *Deleted

## 2016-12-23 ENCOUNTER — Telehealth: Payer: Self-pay | Admitting: Internal Medicine

## 2016-12-23 NOTE — Telephone Encounter (Signed)
Patient device check machine at may not be working please call.

## 2016-12-25 ENCOUNTER — Encounter: Payer: Self-pay | Admitting: Cardiology

## 2017-01-08 ENCOUNTER — Other Ambulatory Visit: Payer: Self-pay | Admitting: Anesthesiology

## 2017-01-08 ENCOUNTER — Encounter: Payer: Self-pay | Admitting: Anesthesiology

## 2017-01-08 ENCOUNTER — Ambulatory Visit
Admission: RE | Admit: 2017-01-08 | Discharge: 2017-01-08 | Disposition: A | Discharge status: Home / Self Care | Payer: Medicare Other | Source: Ambulatory Visit | Attending: Anesthesiology | Admitting: Anesthesiology

## 2017-01-08 ENCOUNTER — Ambulatory Visit (HOSPITAL_BASED_OUTPATIENT_CLINIC_OR_DEPARTMENT_OTHER): Payer: Medicare Other | Admitting: Anesthesiology

## 2017-01-08 VITALS — BP 136/86 | HR 55 | Temp 97.0°F | Resp 14 | Ht 67.0 in | Wt 160.0 lb

## 2017-01-08 DIAGNOSIS — M48061 Spinal stenosis, lumbar region without neurogenic claudication: Secondary | ICD-10-CM | POA: Diagnosis not present

## 2017-01-08 DIAGNOSIS — R52 Pain, unspecified: Secondary | ICD-10-CM

## 2017-01-08 DIAGNOSIS — M5432 Sciatica, left side: Secondary | ICD-10-CM | POA: Diagnosis not present

## 2017-01-08 DIAGNOSIS — M5136 Other intervertebral disc degeneration, lumbar region: Secondary | ICD-10-CM

## 2017-01-08 DIAGNOSIS — M51369 Other intervertebral disc degeneration, lumbar region without mention of lumbar back pain or lower extremity pain: Secondary | ICD-10-CM

## 2017-01-08 MED ORDER — IOPAMIDOL (ISOVUE-M 200) INJECTION 41%
INTRAMUSCULAR | Status: AC
  Filled 2017-01-08: qty 10

## 2017-01-08 MED ORDER — ROPIVACAINE HCL 2 MG/ML IJ SOLN
10.0000 mL | Freq: Once | INTRAMUSCULAR | Status: AC
  Administered 2017-01-08: 10 mL via EPIDURAL

## 2017-01-08 MED ORDER — TRIAMCINOLONE ACETONIDE 40 MG/ML IJ SUSP
40.0000 mg | Freq: Once | INTRAMUSCULAR | Status: AC
  Administered 2017-01-08: 40 mg
  Filled 2017-01-08: qty 1

## 2017-01-08 MED ORDER — IOPAMIDOL (ISOVUE-M 200) INJECTION 41%
INTRAMUSCULAR | Status: AC
Start: 2017-01-08 — End: ?
  Filled 2017-01-08: qty 10

## 2017-01-08 MED ORDER — SODIUM CHLORIDE 0.9 % IJ SOLN
INTRAMUSCULAR | Status: AC
  Filled 2017-01-08: qty 10

## 2017-01-08 MED ORDER — LIDOCAINE HCL (PF) 1 % IJ SOLN
5.0000 mL | Freq: Once | INTRAMUSCULAR | Status: AC
  Administered 2017-01-08: 5 mL via SUBCUTANEOUS
  Filled 2017-01-08: qty 5

## 2017-01-08 MED ORDER — ROPIVACAINE HCL 2 MG/ML IJ SOLN
INTRAMUSCULAR | Status: AC
  Filled 2017-01-08: qty 10

## 2017-01-08 MED ORDER — IOPAMIDOL (ISOVUE-M 200) INJECTION 41%
20.0000 mL | Freq: Once | INTRAMUSCULAR | Status: DC | PRN
  Administered 2017-01-08: 10 mL
  Filled 2017-01-08: qty 20

## 2017-01-08 NOTE — Patient Instructions (Signed)
Pain Management Discharge Instructions  General Discharge Instructions :  If you need to reach your doctor call: Monday-Friday 8:00 am - 4:00 pm at [phone] or toll free [phone].  After clinic hours [phone] to have operator reach doctor.  Bring all of your medication bottles to all your appointments in the pain clinic.  To cancel or reschedule your appointment with Pain Management please remember to call 24 hours in advance to avoid a fee.  Refer to the educational materials which you have been given on: General Risks, I had my Procedure. Discharge Instructions, Post Sedation.  Post Procedure Instructions:  The drugs you were given will stay in your system until tomorrow, so for the next 24 hours you should not drive, make any legal decisions or drink any alcoholic beverages.  You may eat anything you prefer, but it is better to start with liquids then soups and crackers, and gradually work up to solid foods.  Please notify your doctor immediately if you have any unusual bleeding, trouble breathing or pain that is not related to your normal pain.  Depending on the type of procedure that was done, some parts of your body may feel week and/or numb.  This usually clears up by tonight or the next day.  Walk with the use of an assistive device or accompanied by an adult for the 24 hours.  You may use ice on the affected area for the first 24 hours.  Put ice in a Ziploc bag and cover with a towel and place against area 15 minutes on 15 minutes off.  You may switch to heat after 24 hours.Epidural Steroid Injection An epidural steroid injection is a shot of steroid medicine and numbing medicine that is given into the space between the spinal cord and the bones in your back (epidural space). The shot helps relieve pain caused by an irritated or swollen nerve root. The amount of pain relief you get from the injection depends on what is causing the nerve to be swollen and irritated,  and how long your pain lasts. You are more likely to benefit from this injection if your pain is strong and comes on suddenly rather than if you have had pain for a long time. Tell a health care provider about:  Any allergies you have.  All medicines you are taking, including vitamins, herbs, eye drops, creams, and over-the-counter medicines.  Any problems you or family members have had with anesthetic medicines.  Any blood disorders you have.  Any surgeries you have had.  Any medical conditions you have.  Whether you are pregnant or may be pregnant. What are the risks? Generally, this is a safe procedure. However, problems may occur, including:  Headache.  Bleeding.  Infection.  Allergic reaction to medicines.  Damage to your nerves.  What happens before the procedure? Staying hydrated Follow instructions from your health care provider about hydration, which may include:  Up to 2 hours before the procedure - you may continue to drink clear liquids, such as water, clear fruit juice, black coffee, and plain tea.  Eating and drinking restrictions Follow instructions from your health care provider about eating and drinking, which may include:  8 hours before the procedure - stop eating heavy meals or foods such as meat, fried foods, or fatty foods.  6 hours before the procedure - stop eating light meals or foods, such as toast or cereal.  6 hours before the procedure - stop drinking milk or drinks that contain milk.    2 hours before the procedure - stop drinking clear liquids. Medicine  You may be given medicines to lower anxiety.  Ask your health care provider about:  Changing or stopping your regular medicines. This is especially important if you are taking diabetes medicines or blood thinners.  Taking medicines such as aspirin and ibuprofen. These medicines can thin your blood. Do not take these medicines before your procedure if your health care provider instructs  you not to. General instructions  Plan to have someone take you home from the hospital or clinic. What happens during the procedure?  You may receive a medicine to help you relax (sedative).  You will be asked to lie on your abdomen.  The injection site will be cleaned.  A numbing medicine (local anesthetic) will be used to numb the injection site.  A needle will be inserted through your skin into the epidural space. You may feel some discomfort when this happens. An X-ray machine will be used to make sure the needle is put as close as possible to the affected nerve.  A steroid medicine and a local anesthetic will be injected into the epidural space.  The needle will be removed.  A bandage (dressing) will be put over the injection site. What happens after the procedure?  Your blood pressure, heart rate, breathing rate, and blood oxygen level will be monitored until the medicines you were given have worn off.  Your arm or leg may feel weak or numb for a few hours.  The injection site may feel sore.  Do not drive for 24 hours if you received a sedative. This information is not intended to replace advice given to you by your health care provider. Make sure you discuss any questions you have with your health care provider. Document Released: 07/30/2007 Document Revised: 10/04/2015 Document Reviewed: 08/08/2015 Elsevier Interactive Patient Education  2017 [employer]  

## 2017-01-08 NOTE — Progress Notes (Signed)
Subjective:  Patient ID: Thomas [name], male    DOB: 1932/07/09  Age: 83 y.o. MRN: [id number]  CC: Back Pain (low) Procedure: L5-S1 epidural steroid under fluoroscopic guidance without sedation  HPI Thomas [name] presents for reevaluation. He was last seen a few months ago and had an epidural steroid injection at that time for his chronic low back pain and bilateral lower extremity pain and sciatica. He went for several weeks with minimal pain but over the course of the last few weeks has had some recurrence of the same quality characteristic and distribution low back pain and lower extremity pain. No troubles with bowel or bladder function are noted and his strength is been good. He continues to occasionally run and actively walk. He has had some problems with gout and an arrhythmia requiring a pacemaker. He has been off his elliquis  for 3 days.  Outpatient Medications Prior to Visit  Medication Sig Dispense Refill  . atorvastatin (LIPITOR) 40 MG tablet TAKE 1 TABLET BY MOUTH EVERY DAY AT 6PM 90 tablet 3  . B Complex Vitamins (B-COMPLEX/B-12 PO) Take 1 tablet by mouth daily.    Marland Kitchen co-enzyme Q-10 30 MG capsule Take 30 mg by mouth 3 (three) times daily.    . colchicine (COLCRYS) 0.6 MG tablet Take 1 tablet (0.6 mg total) by mouth as needed. 30 tablet 0  . diclofenac sodium (VOLTAREN) 1 % GEL Place 2 g onto the skin daily as needed (PAIN).     Marland Kitchen dutasteride (AVODART) 0.5 MG capsule Take 0.5 mg by mouth daily.    Marland Kitchen ELIQUIS 2.5 MG TABS tablet TAKE 1 TABLET BY MOUTH TWICE A DAY. 60 tablet 3  . esomeprazole (NEXIUM) 40 MG capsule Take 40 mg by mouth daily as needed.     . febuxostat (ULORIC) 40 MG tablet Take 10 mg by mouth daily. TAKES 0.25 TABLET  DAILY    . Garlic 10 MG CAPS Take 10 mg by mouth daily.     . hydrALAZINE (APRESOLINE) 25 MG tablet Take 1 tablet (25 mg total) by mouth 3 (three) times daily as needed. (Patient taking differently: Take 25 mg by mouth 3 (three) times daily as needed.  ONLY FOR HIGH BP READINGS) 90 tablet 6  . Magnesium Oxide 250 MG TABS Take 250 mg by mouth daily.    . metroNIDAZOLE (FLAGYL) 500 MG tablet Takes 1 tablet twice daily for 2 days every week.    . MULTIPLE VITAMIN PO Take 1 tablet by mouth daily.     . Omega-3 Fatty Acids (FISH OIL) 1000 MG CAPS Take 1,000 mg by mouth daily.     Marland Kitchen OVER THE COUNTER MEDICATION Take 1 tablet by mouth 2 (two) times daily. Supplement for Immune Support for Lyme Disease DIM    . OVER THE COUNTER MEDICATION Take 5 mLs by mouth 3 (three) times daily. Lauracidine Immune Support for Lyme Disease    . OVER THE COUNTER MEDICATION Take 1 tablet by mouth daily. Betaglucin    . propranolol (INDERAL) 20 MG tablet Take 20 mg by mouth daily as needed.     . propranolol (INDERAL) 40 MG tablet TAKE ONE (1) TABLET THREE (3) TIMES EACHDAY AS NEEDED 270 tablet 1  . Propylene Glycol-Glycerin (SOOTHE OP) Place 1 drop into both eyes at bedtime.    . rifampin (RIFADIN) 300 MG capsule Take 300 mg by mouth 2 (two) times daily.     . Saccharomyces boulardii (PROBIOTIC) 250 MG CAPS Take 1  tablet by mouth See admin instructions. TAKES 3 HOURS AFTER DOXYCYCLINE    . silodosin (RAPAFLO) 8 MG CAPS capsule Take 8 mg by mouth daily with breakfast.    . terazosin (HYTRIN) 1 MG capsule Take 1 mg by mouth at bedtime.    Marland Kitchen testosterone cypionate (DEPOTESTOSTERONE CYPIONATE) 200 MG/ML injection Inject into the muscle every 28 (twenty-eight) days.     . traMADol (ULTRAM) 50 MG tablet Take 50 mg by mouth every 6 (six) hours as needed.     . TURMERIC PO Take 1 tablet by mouth 2 (two) times daily.    . vitamin E 400 UNIT capsule Take 400 Units by mouth daily.    . clarithromycin (BIAXIN) 500 MG tablet Take 500 mg by mouth daily.    Marland Kitchen lisinopril (PRINIVIL,ZESTRIL) 5 MG tablet Take 1 tablet (5 mg total) by mouth at bedtime. (Patient taking differently: Take 5 mg by mouth daily. ) 90 tablet 3  . metoprolol succinate (TOPROL-XL) 50 MG 24 hr tablet Take 1 tablet  (50 mg total) by mouth daily. Take with or immediately following a meal. 90 tablet 3  . Multiple Vitamins-Minerals (PRESERVISION AREDS PO) Take 1 tablet by mouth daily.    . Multiple Vitamins-Minerals (ZINC PO) Take 1 tablet by mouth daily.     No facility-administered medications prior to visit.    ROS Review of Systems  Objective:  BP 136/86   Pulse (!) 55   Temp (!) 97 F (36.1 C)   Resp 14   Ht 5\' 7"  (1.702 m)   Wt 160 lb (72.6 kg)   SpO2 100%   BMI 25.06 kg/m    BP Readings from Last 3 Encounters:  01/08/17 136/86  11/26/16 128/66  09/17/16 (!) 148/74     Wt Readings from Last 3 Encounters:  01/08/17 160 lb (72.6 kg)  11/26/16 165 lb 9.6 oz (75.1 kg)  09/17/16 166 lb (75.3 kg)  Review of systems Cardiac: No angina or recent ectopy Pulmonary: No shortness of breath GI: No constipation   Physical Exam  PERRL EOMI HEART IS RRR  LCTA MUSCULOSKELETAL mild paraspinous muscle tenderness bilaterally in the lumbar region. He is ambulating well with good muscle strength and bulk. He does have pain consistent with sciatica with straight leg raising.  Labs  Lab Results  Component Value Date   HGBA1C 5.6 07/11/2016   HGBA1C 5.5 06/14/2016   Lab Results  Component Value Date   LDLCALC 58 07/11/2016   CREATININE 1.46 (H) 10/31/2016    -------------------------------------------------------------------------------------------------------------------- Lab Results  Component Value Date   WBC 6.3 10/31/2016   HGB 14.4 10/31/2016   HCT 42.4 10/31/2016   PLT 192 10/31/2016   GLUCOSE 107 (H) 10/31/2016   CHOL 119 07/11/2016   TRIG 39 07/11/2016   HDL 53 07/11/2016   LDLCALC 58 07/11/2016   ALT 29 10/31/2016   AST 34 10/31/2016   NA 134 10/31/2016   K 4.9 10/31/2016   CL 98 10/31/2016   CREATININE 1.46 (H) 10/31/2016   BUN 23 10/31/2016   CO2 24 10/31/2016   TSH 2.230 07/11/2016   PSA 5.8 08/22/2014   INR 1.06 06/25/2016   HGBA1C 5.6 07/11/2016     --------------------------------------------------------------------------------------------------------------------- Dg C-arm 1-60 Min-no Report  Result Date: 01/08/2017 Fluoroscopy was utilized by the requesting physician.  No radiographic interpretation.     Assessment & Plan:   Thomas [name] was seen today for back pain.  Diagnoses and all orders for this visit:  DDD (degenerative disc  disease), lumbar -     Lumbar Epidural Injection -     Lumbar Epidural Injection; Future  Sciatica of left side -     Lumbar Epidural Injection -     Lumbar Epidural Injection; Future  Spinal stenosis of lumbar region without neurogenic claudication -     Lumbar Epidural Injection; Future -     triamcinolone acetonide (KENALOG-40) injection 40 mg; 1 mL (40 mg total) by Other route once. -     ropivacaine (PF) 2 mg/mL (0.2%) (NAROPIN) injection 10 mL; 10 mLs by Epidural route once. -     lidocaine (PF) (XYLOCAINE) 1 % injection 5 mL; Inject 5 mLs into the skin once. -     iopamidol (ISOVUE-M) 41 % intrathecal injection 20 mL; 20 mLs by Other route once as needed for contrast.        ----------------------------------------------------------------------------------------------------------------------  Problem List Items Addressed This Visit    None    Visit Diagnoses    DDD (degenerative disc disease), lumbar    -  Primary   Relevant Medications   triamcinolone acetonide (KENALOG-40) injection 40 mg (Completed)   Other Relevant Orders   Lumbar Epidural Injection   Sciatica of left side       Relevant Orders   Lumbar Epidural Injection   Spinal stenosis of lumbar region without neurogenic claudication       Relevant Medications   triamcinolone acetonide (KENALOG-40) injection 40 mg (Completed)   ropivacaine (PF) 2 mg/mL (0.2%) (NAROPIN) injection 10 mL (Completed)   lidocaine (PF) (XYLOCAINE) 1 % injection 5 mL (Completed)   iopamidol (ISOVUE-M) 41 % intrathecal injection 20 mL   Other  Relevant Orders   Lumbar Epidural Injection        ----------------------------------------------------------------------------------------------------------------------  1. DDD (degenerative disc disease), lumbar We'll proceed with a repeat epidural steroid injection for his lower extremity pain and bilateral low back pain. He has responded favorably to these in the past and generally gets his pain under good control. Unfortunately his failed conservative management with standard physical therapy and stretching strengthening without significant improvement. He remains active and the injections help him to stay ambulatory and active. We'll proceed with his epidural today with return to clinic in 2-3 months for possible repeat injection. - Lumbar Epidural Injection - Lumbar Epidural Injection; Future  2. Sciatica of left side As above - Lumbar Epidural Injection - Lumbar Epidural Injection; Future  3. Spinal stenosis of lumbar region without neurogenic claudication As above - Lumbar Epidural Injection; Future - triamcinolone acetonide (KENALOG-40) injection 40 mg; 1 mL (40 mg total) by Other route once. - ropivacaine (PF) 2 mg/mL (0.2%) (NAROPIN) injection 10 mL; 10 mLs by Epidural route once. - lidocaine (PF) (XYLOCAINE) 1 % injection 5 mL; Inject 5 mLs into the skin once. - iopamidol (ISOVUE-M) 41 % intrathecal injection 20 mL; 20 mLs by Other route once as needed for contrast.    ----------------------------------------------------------------------------------------------------------------------  I am having Thomas [name] maintain his Fish Oil, vitamin E, esomeprazole, febuxostat, VOLTAREN, MULTIPLE VITAMIN PO, Garlic, co-enzyme Z-12, Probiotic, hydrALAZINE, Multiple Vitamins-Minerals (ZINC PO), B Complex Vitamins (B-COMPLEX/B-12 PO), Magnesium Oxide, Multiple Vitamins-Minerals (PRESERVISION AREDS PO), OVER THE COUNTER MEDICATION, OVER THE COUNTER MEDICATION, TURMERIC PO, OVER THE  COUNTER MEDICATION, Propylene Glycol-Glycerin (SOOTHE OP), terazosin, metroNIDAZOLE, rifampin, testosterone cypionate, traMADol, atorvastatin, propranolol, ELIQUIS, clarithromycin, propranolol, lisinopril, metoprolol succinate, dutasteride, silodosin, and colchicine. We administered triamcinolone acetonide, ropivacaine (PF) 2 mg/mL (0.2%), lidocaine (PF), and iopamidol.   Meds ordered this encounter  Medications  .  triamcinolone acetonide (KENALOG-40) injection 40 mg  . ropivacaine (PF) 2 mg/mL (0.2%) (NAROPIN) injection 10 mL  . lidocaine (PF) (XYLOCAINE) 1 % injection 5 mL  . iopamidol (ISOVUE-M) 41 % intrathecal injection 20 mL   Patient's Medications  New Prescriptions   No medications on file  Previous Medications   ATORVASTATIN (LIPITOR) 40 MG TABLET    TAKE 1 TABLET BY MOUTH EVERY DAY AT 6PM   B COMPLEX VITAMINS (B-COMPLEX/B-12 PO)    Take 1 tablet by mouth daily.   CLARITHROMYCIN (BIAXIN) 500 MG TABLET    Take 500 mg by mouth daily.   CO-ENZYME Q-10 30 MG CAPSULE    Take 30 mg by mouth 3 (three) times daily.   COLCHICINE (COLCRYS) 0.6 MG TABLET    Take 1 tablet (0.6 mg total) by mouth as needed.   DICLOFENAC SODIUM (VOLTAREN) 1 % GEL    Place 2 g onto the skin daily as needed (PAIN).    DUTASTERIDE (AVODART) 0.5 MG CAPSULE    Take 0.5 mg by mouth daily.   ELIQUIS 2.5 MG TABS TABLET    TAKE 1 TABLET BY MOUTH TWICE A DAY.   ESOMEPRAZOLE (NEXIUM) 40 MG CAPSULE    Take 40 mg by mouth daily as needed.    FEBUXOSTAT (ULORIC) 40 MG TABLET    Take 10 mg by mouth daily. TAKES 0.25 TABLET  DAILY   GARLIC 10 MG CAPS    Take 10 mg by mouth daily.    HYDRALAZINE (APRESOLINE) 25 MG TABLET    Take 1 tablet (25 mg total) by mouth 3 (three) times daily as needed.   LISINOPRIL (PRINIVIL,ZESTRIL) 5 MG TABLET    Take 1 tablet (5 mg total) by mouth at bedtime.   MAGNESIUM OXIDE 250 MG TABS    Take 250 mg by mouth daily.   METOPROLOL SUCCINATE (TOPROL-XL) 50 MG 24 HR TABLET    Take 1 tablet (50 mg  total) by mouth daily. Take with or immediately following a meal.   METRONIDAZOLE (FLAGYL) 500 MG TABLET    Takes 1 tablet twice daily for 2 days every week.   MULTIPLE VITAMIN PO    Take 1 tablet by mouth daily.    MULTIPLE VITAMINS-MINERALS (PRESERVISION AREDS PO)    Take 1 tablet by mouth daily.   MULTIPLE VITAMINS-MINERALS (ZINC PO)    Take 1 tablet by mouth daily.   OMEGA-3 FATTY ACIDS (FISH OIL) 1000 MG CAPS    Take 1,000 mg by mouth daily.    OVER THE COUNTER MEDICATION    Take 1 tablet by mouth 2 (two) times daily. Supplement for Immune Support for Lyme Disease DIM   OVER THE COUNTER MEDICATION    Take 5 mLs by mouth 3 (three) times daily. Lauracidine Immune Support for Lyme Disease   OVER THE COUNTER MEDICATION    Take 1 tablet by mouth daily. Betaglucin   PROPRANOLOL (INDERAL) 20 MG TABLET    Take 20 mg by mouth daily as needed.    PROPRANOLOL (INDERAL) 40 MG TABLET    TAKE ONE (1) TABLET THREE (3) TIMES EACHDAY AS NEEDED   PROPYLENE GLYCOL-GLYCERIN (SOOTHE OP)    Place 1 drop into both eyes at bedtime.   RIFAMPIN (RIFADIN) 300 MG CAPSULE    Take 300 mg by mouth 2 (two) times daily.    SACCHAROMYCES BOULARDII (PROBIOTIC) 250 MG CAPS    Take 1 tablet by mouth See admin instructions. TAKES 3 HOURS AFTER DOXYCYCLINE   SILODOSIN (  RAPAFLO) 8 MG CAPS CAPSULE    Take 8 mg by mouth daily with breakfast.   TERAZOSIN (HYTRIN) 1 MG CAPSULE    Take 1 mg by mouth at bedtime.   TESTOSTERONE CYPIONATE (DEPOTESTOSTERONE CYPIONATE) 200 MG/ML INJECTION    Inject into the muscle every 28 (twenty-eight) days.    TRAMADOL (ULTRAM) 50 MG TABLET    Take 50 mg by mouth every 6 (six) hours as needed.    TURMERIC PO    Take 1 tablet by mouth 2 (two) times daily.   VITAMIN E 400 UNIT CAPSULE    Take 400 Units by mouth daily.  Modified Medications   No medications on file  Discontinued Medications   No medications on file    ---------------------------------------------------------------------------------------------------------------------- Procedure: Lumbar epidural steroid injection under fluoroscopic guidance without sedation at L5-S1    Procedure: L5-S1 LESI with fluoroscopic guidance and moderate sedation  NOTE: The risks, benefits, and expectations of the procedure have been discussed and explained to the patient who was understanding and in agreement with suggested treatment plan. No guarantees were made.  DESCRIPTION OF PROCEDURE: Lumbar epidural steroid injection with no IV Versed, EKG, blood pressure, pulse, and pulse oximetry monitoring. The procedure was performed with the patient in the prone position under fluoroscopic guidance. I injected subcutaneous lidocaine overlying the L5-S1 site after its fluoroscopic identifictation.  Using strict aseptic technique, I then advanced an 18-gauge Tuohy epidural needle in the midline using interlaminar approach via loss-of-resistance to saline technique. There was negative aspiration for heme but positive CSF. I replaced the needle and a slightly more cephalad site healing loss-of-resistance to saline and no CSF. I then confirmed position with both AP and Lateral fluoroscan. 2 cc of Isovue were injected and a  total of 5 mL of Preservative-Free normal saline mixed with 40 mg of Kenalog and 1cc Ropicaine 0.2 percent were injected incrementally via the  epidurally placed needle. The needle was removed. The patient tolerated the injection well and was convalesced and discharged to home in stable condition. Should the patient have any post procedure difficulty they have been instructed on how to contact us for assistance.    Follow-up: Return in about 3 months (around 04/09/2017) for procedure.    Molli Barrows, MD

## 2017-01-08 NOTE — Progress Notes (Signed)
Safety precautions to be maintained throughout the outpatient stay will include: orient to surroundings, keep bed in low position, maintain call bell within reach at all times, provide assistance with transfer out of bed and ambulation.  

## 2017-01-09 ENCOUNTER — Telehealth: Payer: Self-pay

## 2017-01-09 NOTE — Telephone Encounter (Signed)
Denies any needs at this time. Instructed to call if needed. 

## 2017-01-10 DIAGNOSIS — R5383 Other fatigue: Secondary | ICD-10-CM | POA: Diagnosis not present

## 2017-01-10 DIAGNOSIS — G909 Disorder of the autonomic nervous system, unspecified: Secondary | ICD-10-CM | POA: Diagnosis not present

## 2017-01-10 DIAGNOSIS — M549 Dorsalgia, unspecified: Secondary | ICD-10-CM | POA: Diagnosis not present

## 2017-01-10 DIAGNOSIS — M2550 Pain in unspecified joint: Secondary | ICD-10-CM | POA: Diagnosis not present

## 2017-01-10 DIAGNOSIS — R5381 Other malaise: Secondary | ICD-10-CM | POA: Diagnosis not present

## 2017-01-22 DIAGNOSIS — L820 Inflamed seborrheic keratosis: Secondary | ICD-10-CM | POA: Diagnosis not present

## 2017-01-22 DIAGNOSIS — L570 Actinic keratosis: Secondary | ICD-10-CM | POA: Diagnosis not present

## 2017-01-22 DIAGNOSIS — L812 Freckles: Secondary | ICD-10-CM | POA: Diagnosis not present

## 2017-01-22 DIAGNOSIS — L821 Other seborrheic keratosis: Secondary | ICD-10-CM | POA: Diagnosis not present

## 2017-01-22 DIAGNOSIS — L578 Other skin changes due to chronic exposure to nonionizing radiation: Secondary | ICD-10-CM | POA: Diagnosis not present

## 2017-01-24 ENCOUNTER — Other Ambulatory Visit
Admission: RE | Admit: 2017-01-24 | Discharge: 2017-01-24 | Disposition: A | Discharge status: Home / Self Care | Payer: Medicare Other | Source: Ambulatory Visit | Attending: Family Medicine | Admitting: Family Medicine

## 2017-01-27 ENCOUNTER — Other Ambulatory Visit
Admission: RE | Admit: 2017-01-27 | Discharge: 2017-01-27 | Disposition: A | Discharge status: Home / Self Care | Payer: Medicare Other | Source: Ambulatory Visit | Attending: Family Medicine | Admitting: Family Medicine

## 2017-01-27 DIAGNOSIS — I639 Cerebral infarction, unspecified: Secondary | ICD-10-CM | POA: Insufficient documentation

## 2017-01-27 DIAGNOSIS — I442 Atrioventricular block, complete: Secondary | ICD-10-CM | POA: Diagnosis not present

## 2017-01-27 DIAGNOSIS — E784 Other hyperlipidemia: Secondary | ICD-10-CM | POA: Diagnosis not present

## 2017-01-27 DIAGNOSIS — R739 Hyperglycemia, unspecified: Secondary | ICD-10-CM | POA: Diagnosis not present

## 2017-01-27 DIAGNOSIS — R5383 Other fatigue: Secondary | ICD-10-CM | POA: Insufficient documentation

## 2017-01-28 LAB — ACID FAST SMEAR (AFB, MYCOBACTERIA)
Acid Fast Smear: NEGATIVE
Acid Fast Smear: NEGATIVE

## 2017-01-28 LAB — ACID FAST SMEAR (AFB): ACID FAST SMEAR - AFSCU2: NEGATIVE

## 2017-01-29 LAB — MISC LABCORP TEST (SEND OUT)
LABCORP TEST CODE: 502124
Labcorp test code: 502124
Labcorp test code: 505026

## 2017-01-29 LAB — ZINC: ZINC: 98 ug/dL (ref 56–134)

## 2017-01-30 DIAGNOSIS — H02135 Senile ectropion of left lower eyelid: Secondary | ICD-10-CM | POA: Diagnosis not present

## 2017-01-31 LAB — MISC LABCORP TEST (SEND OUT)
LABCORP TEST CODE: 500179
Labcorp test code: 820466

## 2017-02-05 LAB — MISC LABCORP TEST (SEND OUT): LABCORP TEST CODE: 502124

## 2017-02-05 NOTE — Telephone Encounter (Signed)
Appt scheduled

## 2017-02-06 LAB — MISC LABCORP TEST (SEND OUT): LABCORP TEST CODE: 176092

## 2017-02-07 LAB — MISC LABCORP TEST (SEND OUT): Labcorp test code: 502124

## 2017-02-10 LAB — MISC LABCORP TEST (SEND OUT)
LABCORP TEST CODE: 820444
LABCORP TEST CODE: 820452

## 2017-02-11 NOTE — Progress Notes (Signed)
[facility]  PATIENT: Thomas [name] DOB: 02/10/1933   REASON FOR VISIT: Follow-up for TIA likely due to small vessel disease HISTORY FROM: Patient and wife    HISTORY OF PRESENT ILLNESS: 08/13/16 PSMr Thomas [name] is a 5 year caucasian male seen today for first office f/u visit after [facility] admission for TIA in Feb 2018. 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 Symptoms had fully resolved by the time he made it to the hospital. He was seen in consultation by neurology (Dr. Doy Mince) who documented some mild sensory disturbance on the L side of the face but no other deficits. Due to his minimal deficits, he was not a candidate for thrombolytic therapy and this was deferred. He was admitted for TIA evaluation. MRI brain showed no acute ischemia with mild atrophy and mild small vessel disease. MRA of the head showed no flow limiting stenosis. Carotid Dopplers showed no hemodynamically significant stenosis. TTE was negative for embolic source. Hemoglobin a1c was 5.5 and LDL 56. His symptoms completely resolved per notes. He was discharged on aspirin, Plavix, and Lipitor 40 mg daily with recommendations to follow up with outpatient neurology after discharge. During the admission he was noted to have complete heart block and it was recommended that he have a pacemaker placed. He followed up with cardiology as an outpatient at which time he was noted to have atrial flutter, a new diagnosis for him. He underwent subsequent placement of a MDT dual chamber PPM on 06/19/16. After his pacemaker was placed, he was started on Eliquis and his aspirin and Plavix were discontinued.  On 06/25/16 the patient returned to the ED at [facility] after he developed weakness of the left arm and leg. He  also reported some difficulty with his speech. He decided to go to the hospital due to concern that he could have a head bleed due to his Eliquis. Neurology consultation was obtained and he was seen by Dr. Doy Mince. He told her that he had been having intermittent numbness of the L face and arm since his initial presentation on 06/14/16. Today his symptoms returned but now he also had weakness of the left arm and leg which were new. She documented decreased sensation on the left face, arm, and leg with 5-/5 weakness in the LLE. NIHSS was 3. He was not a candidate for tPA as he is anticoagulated. He was not felt to be a candidate for intervention due to low NIHSS score. However, she felt that he would be best served by being transferred to [facility] where intervention services are available should his condition deteriorate further. After arrival at [facility] hospital he reported that his symptoms have largely resolved and he is no longer having any weakness. He still had some mild sensory changes along the ulnar aspect of the left forearm and hand. MRI scan of the brain on admission showed no acute infarct and only mild generalized atrophy and changes of small vessel disease. MRA of the brain showed no large vessel intracranial stenosis or occlusion. LDL cholesterol was 75 mg percent. Hemoglobin A1c was 5.5. Transthoracic echo showed normal ejection fraction. Carotid Doppler showed bilateral mild right greater than left 50% stenosis. Patient was on eliquis for atrial flutter which was continued. He states his done well since discharge. He still  has some occasional numbness in his hand but it's getting better. He has been eating quite healthy. He used to run 4 miles per day and wants to start doing that again. He has significant back pain which limits his physical activity. He will need epidural steroid injection but will have to come off eliquis for that. He had a polysomnogram done last week but the results are yet  pending. He has no neurological complaints. UPDATE 02/12/2017 CM Thomas [name], 83 year old male returns for follow-up with history of TIA which occurred in February 2018. He is currently on eliquis for secondary stroke prevention and known history  of atrial flutter. He has not had recurrent stroke or TIA symptoms. He has no bleeding minimal bruising He continues to have some mild numbness in his left hand on the  fingertips but it has improved. He remains on Lipitor without myalgias blood pressure in the office today 139/77. He is back to running 3 miles a day. He claims this has helped his back pain. His sleep study done in our office did not show any significant sleep apnea. It did show significant EKG abnormalities and he was asked to follow up with Dr. Rockey Situ  his cardiologist which he has done He returns for reevaluation   REVIEW OF SYSTEMS: Full 14 system review of systems performed and notable only for those listed, all others are neg:  Constitutional: neg  Cardiovascular: neg Ear/Nose/Throat: neg  Skin: neg Eyes: neg Respiratory: neg Gastroitestinal: neg  Hematology/Lymphatic: neg  Endocrine: neg Musculoskeletal:neg Allergy/Immunology: neg Neurological: neg Psychiatric: neg Sleep : neg   ALLERGIES: Allergies  Allergen Reactions  . Iodine Hives    IVP contrast  . Penicillins Itching    ITCHY FEELING IN FINGERS Has patient had a PCN reaction causing immediate rash, facial/tongue/throat swelling, SOB or lightheadedness with hypotension: No Has patient had a PCN reaction causing severe rash involving mucus membranes or skin necrosis: No Has patient had a PCN reaction that required hospitalization No Has patient had a PCN reaction occurring within the last 10 years: No If all of the above answers are "NO", then may proceed with Cephalosporin use.     HOME MEDICATIONS: Outpatient Medications Prior to Visit  Medication Sig Dispense Refill  . atorvastatin (LIPITOR) 40 MG tablet  TAKE 1 TABLET BY MOUTH EVERY DAY AT 6PM 90 tablet 3  . B Complex Vitamins (B-COMPLEX/B-12 PO) Take 1 tablet by mouth daily.    Marland Kitchen co-enzyme Q-10 30 MG capsule Take 30 mg by mouth 3 (three) times daily.    . colchicine (COLCRYS) 0.6 MG tablet Take 1 tablet (0.6 mg total) by mouth as needed. 30 tablet 0  . diclofenac sodium (VOLTAREN) 1 % GEL Place 2 g onto the skin daily as needed (PAIN).     Marland Kitchen dutasteride (AVODART) 0.5 MG capsule Take 0.5 mg by mouth daily.    Marland Kitchen ELIQUIS 2.5 MG TABS tablet TAKE 1 TABLET BY MOUTH TWICE A DAY. 60 tablet 3  . esomeprazole (NEXIUM) 40 MG capsule Take 40 mg by mouth daily as needed.     . febuxostat (ULORIC) 40 MG tablet Take 10 mg by mouth daily. TAKES 0.25 TABLET  DAILY    . Garlic 10 MG CAPS Take 10 mg by mouth daily.     . hydrALAZINE (APRESOLINE) 25 MG tablet Take 1 tablet (25 mg total) by mouth 3 (three) times daily as needed. (Patient taking differently: Take 25 mg by mouth 3 (three) times daily as needed. ONLY  FOR HIGH BP READINGS) 90 tablet 6  . Magnesium Oxide 250 MG TABS Take 250 mg by mouth daily.    . metroNIDAZOLE (FLAGYL) 500 MG tablet Takes 1 tablet twice daily for 2 days every week.    . MULTIPLE VITAMIN PO Take 1 tablet by mouth daily. Centrum Silver    . Omega-3 Fatty Acids (FISH OIL) 1000 MG CAPS Take 1,000 mg by mouth daily.     Marland Kitchen OVER THE COUNTER MEDICATION Take 1 tablet by mouth 2 (two) times daily. Supplement for Immune Support for Lyme Disease DIM    . OVER THE COUNTER MEDICATION Take 5 mLs by mouth 3 (three) times daily. Lauracidine Immune Support for Lyme Disease    . OVER THE COUNTER MEDICATION Take 1 tablet by mouth daily. Betaglucin    . propranolol (INDERAL) 20 MG tablet Take 20 mg by mouth daily as needed.     . propranolol (INDERAL) 40 MG tablet TAKE ONE (1) TABLET THREE (3) TIMES EACHDAY AS NEEDED 270 tablet 1  . Propylene Glycol-Glycerin (SOOTHE OP) Place 1 drop into both eyes at bedtime.    . Saccharomyces boulardii (PROBIOTIC) 250  MG CAPS Take 1 tablet by mouth See admin instructions. TAKES 3 HOURS AFTER DOXYCYCLINE    . silodosin (RAPAFLO) 8 MG CAPS capsule Take 8 mg by mouth daily with breakfast.    . terazosin (HYTRIN) 1 MG capsule Take 1 mg by mouth at bedtime.    Marland Kitchen testosterone cypionate (DEPOTESTOSTERONE CYPIONATE) 200 MG/ML injection Inject into the muscle every 28 (twenty-eight) days.     . traMADol (ULTRAM) 50 MG tablet Take 50 mg by mouth every 6 (six) hours as needed.     . TURMERIC PO Take 1 tablet by mouth 2 (two) times daily.    . vitamin E 400 UNIT capsule Take 400 Units by mouth daily.    Marland Kitchen lisinopril (PRINIVIL,ZESTRIL) 5 MG tablet Take 1 tablet (5 mg total) by mouth at bedtime. (Patient taking differently: Take 5 mg by mouth daily. ) 90 tablet 3  . metoprolol succinate (TOPROL-XL) 50 MG 24 hr tablet Take 1 tablet (50 mg total) by mouth daily. Take with or immediately following a meal. 90 tablet 3  . clarithromycin (BIAXIN) 500 MG tablet Take 500 mg by mouth daily.    . Multiple Vitamins-Minerals (PRESERVISION AREDS PO) Take 1 tablet by mouth daily.    . Multiple Vitamins-Minerals (ZINC PO) Take 1 tablet by mouth daily.    . rifampin (RIFADIN) 300 MG capsule Take 300 mg by mouth 2 (two) times daily.      No facility-administered medications prior to visit.     PAST MEDICAL HISTORY: Past Medical History:  Diagnosis Date  . Aortic valve disorders   . Arthritis   . Atrial flutter ([place]) 06/18/2016   "AF or AFl; not sure which" (06/23/2016)  . Basal cell carcinoma    "face, nose left shoulder, left arm" (06/19/2016)  . BBB (bundle branch block)    hx right  . Chronic back pain    "neck, thoracic, lower back" (06/19/2016)  . Complete heart block ([place]) 06/2016  . GERD (gastroesophageal reflux disease)   . Heart block    "I've had type I, II Wen[place] before now" (06/19/2016)  . History of gout   . History of hiatal hernia    "self dx'd" (06/19/2016)  . Hyperlipidemia   . Hypertension   . Lyme disease     "dx'd by me 2003; cx's showed dx 08/2015"  .  Migraine    "3-4/year" (06/19/2016)  . Presence of permanent cardiac pacemaker 06/19/2016  . PVC's (premature ventricular contractions)   . Renal cancer, left ([place]) 2006   S/P cryotherapy  . Spinal stenosis    "cervical, 1 thoracic, lumbar" (06/19/2016)  . Squamous carcinoma    "face, nose left shoulder, left arm" (06/19/2016)  . Stroke ([place])   . TIA (transient ischemic attack) 06/14/2016   "I'm not sure that's what it was" (06/25/2016)    PAST SURGICAL HISTORY: Past Surgical History:  Procedure Laterality Date  . ANKLE FRACTURE SURGERY Right 1967  . BASAL CELL CARCINOMA EXCISION     "face, nose left shoulder, left arm"  . BIOPSY PROSTATE  2001 & 2003  . CARDIAC CATHETERIZATION  1990's  . FRACTURE SURGERY    . INGUINAL HERNIA REPAIR Left 2012  . INSERT / REPLACE / REMOVE PACEMAKER  06/19/2016  . LAPAROSCOPIC ABLATION RENAL MASS    . PACEMAKER IMPLANT N/A 06/19/2016   Procedure: Pacemaker Implant;  Surgeon: Deboraha Sprang, MD;  Location: [facility];  Service: Cardiovascular;  Laterality: N/A;  . pacemasker    . PROSTATE SURGERY    . SQUAMOUS CELL CARCINOMA EXCISION     "face, nose left shoulder, left arm"  . TONSILLECTOMY AND ADENOIDECTOMY      FAMILY HISTORY: Family History  Problem Relation Age of Onset  . Heart attack Brother   . Stroke Brother   . Aortic stenosis Mother   . Arthritis Father     SOCIAL HISTORY: Social History   Social History  . Marital status: Married    Spouse name: N/A  . Number of children: 3  . Years of education: MD    Occupational History  . Retired     Social History Main Topics  . Smoking status: Never Smoker  . Smokeless tobacco: Never Used  . Alcohol use 0.0 - 1.2 oz/week     Comment: occasionally  . Drug use: No  . Sexual activity: Yes   Other Topics Concern  . Not on file   Social History Narrative   Independent at baseline. Lives with family   Drinks tea in the  morning      PHYSICAL EXAM  Vitals:   02/12/17 1257  BP: 139/77  Pulse: (!) 49  Weight: 169 lb 9.6 oz (76.9 kg)  Height: 5\' 7"  (1.702 m)   Body mass index is 26.56 kg/m.  Generalized: Well developed, in no acute distress  Head: normocephalic and atraumatic,. Oropharynx benign  Neck: Supple, no carotid bruits  Cardiac: Regular rate rhythm, no murmur  Musculoskeletal: No deformity   Neurological examination   Mentation: Alert oriented to time, place, history taking. Attention span and concentration appropriate. Recent and remote memory intact.  Follows all commands speech and language fluent.   Cranial nerve II-XII: Fundoscopic exam reveals sharp disc margins.Pupils were equal round reactive to light extraocular movements were full, visual field were full on confrontational test. Facial sensation and strength were normal. hearing was intact to finger rubbing bilaterally. Uvula tongue midline. head turning and shoulder shrug were normal and symmetric.Tongue protrusion into cheek strength was normal. Motor: normal bulk and tone, full strength in the BUE, BLE, fine finger movements normal, no pronator drift.  Sensory: normal and symmetric to light touch, pinprick, and  Vibration, In the upper and lower extremities  Coordination: finger-nose-finger, heel-to-shin bilaterally, no dysmetria, no tremor Reflexes: 1+ upper lower and symmetric, plantar responses were flexor bilaterally. Gait and Station:  Rising up from seated position without assistance, normal stance,  moderate stride, good arm swing, smooth turning, able to perform tiptoe, and heel walking without difficulty. Tandem gait is steady. No assistive device  DIAGNOSTIC DATA (LABS, IMAGING, TESTING) - I reviewed patient records, labs, notes, testing and imaging myself where available.  Lab Results  Component Value Date   WBC 6.3 10/31/2016   HGB 14.4 10/31/2016   HCT 42.4 10/31/2016   MCV 85 10/31/2016   PLT 192 10/31/2016       Component Value Date/Time   NA 134 10/31/2016 1009   NA 138 06/26/2012 1106   K 4.9 10/31/2016 1009   K 4.4 06/26/2012 1106   CL 98 10/31/2016 1009   CL 105 06/26/2012 1106   CO2 24 10/31/2016 1009   CO2 29 06/26/2012 1106   GLUCOSE 107 (H) 10/31/2016 1009   GLUCOSE 106 (H) 06/26/2016 1500   GLUCOSE 105 (H) 06/26/2012 1106   BUN 23 10/31/2016 1009   BUN 16 06/26/2012 1106   CREATININE 1.46 (H) 10/31/2016 1009   CREATININE 1.31 (H) 06/26/2012 1106   CALCIUM 9.6 10/31/2016 1009   CALCIUM 8.9 06/26/2012 1106   PROT 6.7 10/31/2016 1009   PROT 7.5 06/26/2012 1106   ALBUMIN 4.3 10/31/2016 1009   ALBUMIN 3.8 06/26/2012 1106   AST 34 10/31/2016 1009   AST 23 06/26/2012 1106   ALT 29 10/31/2016 1009   ALT 24 06/26/2012 1106   ALKPHOS 82 10/31/2016 1009   ALKPHOS 68 06/26/2012 1106   BILITOT 0.6 10/31/2016 1009   BILITOT 0.6 06/26/2012 1106   GFRNONAA 44 (L) 10/31/2016 1009   GFRNONAA 52 (L) 06/26/2012 1106   GFRAA 51 (L) 10/31/2016 1009   GFRAA >60 06/26/2012 1106   Lab Results  Component Value Date   CHOL 119 07/11/2016   HDL 53 07/11/2016   LDLCALC 58 07/11/2016   TRIG 39 07/11/2016   CHOLHDL 2.4 06/14/2016   Lab Results  Component Value Date   HGBA1C 5.6 07/11/2016   No results found for: VITAMINB12 Lab Results  Component Value Date   TSH 2.230 07/11/2016      ASSESSMENT AND PLAN 6 year caucasian male with recurrent episode of left body paresthesias likely TIAs due to small vessel disease. He does have atrial flutter and episodes occurred despite being on eliquis.Stroke  risk factors of hyperlipidemia, hypertension, atrial flutter, age and sex   PLAN:Stressed the importance of management of risk factors to prevent further stroke Continue eliquis for secondary stroke prevention and atrial fib flutter Maintain strict control of hypertension with blood pressure goal below 130/90, today's reading 139/77 continue antihypertensive medications Control of  diabetes with hemoglobin A1c below 6.5 followed by primary care Cholesterol with LDL cholesterol less than 70, followed by primary care,  continue Lipitor  Exercise by walking, (pt is a runner)3 miles per day  eat healthy diet with whole grains,  fresh fruits and vegetables Continue follow-up with cardiology and primary care her stroke risk management Will discharge from stroke clinic I spent 30 minutes in total face to face time with the patient more than 50% of which was spent counseling and coordination of care, reviewing test results reviewing medications and discussing and reviewing the diagnosis of stroke/TIA and management of risk factors Dennie Bible, [facility], [facility], APRN  [facility] [address] [place], [place] [address] [phone]

## 2017-02-12 ENCOUNTER — Encounter: Payer: Self-pay | Admitting: Nurse Practitioner

## 2017-02-12 ENCOUNTER — Ambulatory Visit (INDEPENDENT_AMBULATORY_CARE_PROVIDER_SITE_OTHER): Payer: Medicare Other | Admitting: Nurse Practitioner

## 2017-02-12 VITALS — BP 139/77 | HR 49 | Ht 67.0 in | Wt 169.6 lb

## 2017-02-12 DIAGNOSIS — E7849 Other hyperlipidemia: Secondary | ICD-10-CM | POA: Diagnosis not present

## 2017-02-12 DIAGNOSIS — I10 Essential (primary) hypertension: Secondary | ICD-10-CM | POA: Diagnosis not present

## 2017-02-12 DIAGNOSIS — G459 Transient cerebral ischemic attack, unspecified: Secondary | ICD-10-CM

## 2017-02-12 NOTE — Patient Instructions (Addendum)
Stressed the importance of management of risk factors to prevent further stroke Continue eliquis for secondary stroke prevention and atrial fib flutter Maintain strict control of hypertension with blood pressure goal below 130/90, today's reading 139/77 continue antihypertensive medications Control of diabetes with hemoglobin A1c below 6.5 followed by primary care Cholesterol with LDL cholesterol less than 70, followed by primary care,  continue Lipitor  Exercise by walking, (pt is a runner)  eat healthy diet with whole grains,  fresh fruits and vegetables Continue follow-up with cardiology and primary care  Will discharge Stroke Prevention Some medical conditions and behaviors are associated with an increased chance of having a stroke. You may prevent a stroke by making healthy choices and managing medical conditions. How can I reduce my risk of having a stroke?  Stay physically active. Get at least 30 minutes of activity on most or all days.  Do not smoke. It may also be helpful to avoid exposure to secondhand smoke.  Limit alcohol use. Moderate alcohol use is considered to be: ? No more than 2 drinks per day for men. ? No more than 1 drink per day for nonpregnant women.  Eat healthy foods. This involves: ? Eating 5 or more servings of fruits and vegetables a day. ? Making dietary changes that address high blood pressure (hypertension), high cholesterol, diabetes, or obesity.  Manage your cholesterol levels. ? Making food choices that are high in fiber and low in saturated fat, trans fat, and cholesterol may control cholesterol levels. ? Take any prescribed medicines to control cholesterol as directed by your health care provider.  Manage your diabetes. ? Controlling your carbohydrate and sugar intake is recommended to manage diabetes. ? Take any prescribed medicines to control diabetes as directed by your health care provider.  Control your hypertension. ? Making food choices that  are low in salt (sodium), saturated fat, trans fat, and cholesterol is recommended to manage hypertension. ? Ask your health care provider if you need treatment to lower your blood pressure. Take any prescribed medicines to control hypertension as directed by your health care provider. ? If you are 51-75 years of age, have your blood pressure checked every 3-5 years. If you are 27 years of age or older, have your blood pressure checked every year.  Maintain a healthy weight. ? Reducing calorie intake and making food choices that are low in sodium, saturated fat, trans fat, and cholesterol are recommended to manage weight.  Stop drug abuse.  Avoid taking birth control pills. ? Talk to your health care provider about the risks of taking birth control pills if you are over 63 years old, smoke, get migraines, or have ever had a blood clot.  Get evaluated for sleep disorders (sleep apnea). ? Talk to your health care provider about getting a sleep evaluation if you snore a lot or have excessive sleepiness.  Take medicines only as directed by your health care provider. ? For some people, aspirin or blood thinners (anticoagulants) are helpful in reducing the risk of forming abnormal blood clots that can lead to stroke. If you have the irregular heart rhythm of atrial fibrillation, you should be on a blood thinner unless there is a good reason you cannot take them. ? Understand all your medicine instructions.  Make sure that other conditions (such as anemia or atherosclerosis) are addressed. Get help right away if:  You have sudden weakness or numbness of the face, arm, or leg, especially on one side of the body.  Your  face or eyelid droops to one side.  You have sudden confusion.  You have trouble speaking (aphasia) or understanding.  You have sudden trouble seeing in one or both eyes.  You have sudden trouble walking.  You have dizziness.  You have a loss of balance or  coordination.  You have a sudden, severe headache with no known cause.  You have new chest pain or an irregular heartbeat. Any of these symptoms may represent a serious problem that is an emergency. Do not wait to see if the symptoms will go away. Get medical help at once. Call your local emergency services (911 in [place] Do not drive yourself to the hospital. This information is not intended to replace advice given to you by your health care provider. Make sure you discuss any questions you have with your health care provider. Document Released: 05/30/2004 Document Revised: 09/28/2015 Document Reviewed: 10/23/2012 Elsevier Interactive Patient Education  2017 [employer].

## 2017-02-14 NOTE — Progress Notes (Signed)
I agree with the above plan 

## 2017-03-10 ENCOUNTER — Other Ambulatory Visit: Payer: Self-pay

## 2017-03-12 ENCOUNTER — Telehealth: Payer: Self-pay

## 2017-03-12 NOTE — Telephone Encounter (Signed)
Patient's wife Hassan Rowan is requesting a refill on testosterone cypionate (DEPOTESTOSTERONE CYPIONATE) 200 MG/ML injection be called in at  [facility] and Androgel be called in at  [facility] CB# [phone] home or cell [phone]

## 2017-03-12 NOTE — Telephone Encounter (Signed)
Please review-Thomas [name] Thomas [name], RMA  

## 2017-03-13 MED ORDER — TESTOSTERONE CYPIONATE 200 MG/ML IM SOLN: 200.0000 mg | INTRAMUSCULAR | 0 refills | Status: DC

## 2017-03-13 NOTE — Telephone Encounter (Signed)
Printed injectable. Need to know dosages of androgel.

## 2017-03-13 NOTE — Telephone Encounter (Signed)
Rx is not for androgel. It is for his Testerone compound 5% daily that is done at [facility]. Med called into [facility]. Injectable called into pharmacy also.

## 2017-03-13 NOTE — Telephone Encounter (Signed)
Ok--probably has to be written.

## 2017-03-18 ENCOUNTER — Ambulatory Visit: Payer: Medicare Other | Admitting: Cardiovascular Disease

## 2017-03-18 ENCOUNTER — Encounter: Payer: Self-pay | Admitting: Cardiovascular Disease

## 2017-03-18 VITALS — BP 130/78 | HR 53 | Ht 67.0 in | Wt 169.0 lb

## 2017-03-18 DIAGNOSIS — I6509 Occlusion and stenosis of unspecified vertebral artery: Secondary | ICD-10-CM

## 2017-03-18 DIAGNOSIS — I10 Essential (primary) hypertension: Secondary | ICD-10-CM

## 2017-03-18 DIAGNOSIS — G459 Transient cerebral ischemic attack, unspecified: Secondary | ICD-10-CM

## 2017-03-18 DIAGNOSIS — E782 Mixed hyperlipidemia: Secondary | ICD-10-CM

## 2017-03-18 DIAGNOSIS — I639 Cerebral infarction, unspecified: Secondary | ICD-10-CM | POA: Diagnosis not present

## 2017-03-18 DIAGNOSIS — I442 Atrioventricular block, complete: Secondary | ICD-10-CM

## 2017-03-18 DIAGNOSIS — I951 Orthostatic hypotension: Secondary | ICD-10-CM

## 2017-03-18 DIAGNOSIS — I2510 Atherosclerotic heart disease of native coronary artery without angina pectoris: Secondary | ICD-10-CM | POA: Diagnosis not present

## 2017-03-18 DIAGNOSIS — I480 Paroxysmal atrial fibrillation: Secondary | ICD-10-CM | POA: Diagnosis not present

## 2017-03-18 MED ORDER — AMLODIPINE BESYLATE 5 MG PO TABS: 5.0000 mg | ORAL_TABLET | Freq: Two times a day (BID) | ORAL | 4 refills | Status: DC

## 2017-03-18 NOTE — Progress Notes (Signed)
Cardiology Office Note  Date:  03/18/2017   ID:  TANYA MARVIN, DOB 08-31-1932, MRN [id number]  PCP:  Jerrol Banana., MD   Chief Complaint  Patient presents with  . OTHER    C/o occassional uneasy feeling may be afib not sure and flunctuating BP. Pt would like to discuss Lisinopril options due to Creatine levels. Meds reviewed verbally with pt.    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 Sees a specialist at [facility] outside the system Who presents for follow-up of his complete heart block, pacer  Running again, up to 4 miles, Does not appreciate atrial fibrillation Download from May 2018 reviewed with him in detail High burden of atrial fibrillation, 30% Most recent download September is  Not in the computer yet  He is concerned about potential for side effects from ACE inhibitors would like to change to amlodipine Blood pressure markedly elevated in the morning 096 systolic, down to 90 at nighttime periodically  Previous sleep study, was told he had paroxysmal flutter/fib  EKG personally reviewed by myself on todays visit Atrial sensed ventricular paced rhythm rate 53 bpm   Other past medical history reviewed  Recent admission to hospital with SOB, found to be in heart block 06/2016 permanent pacemaker implantation (06/19/2016),   presented to the [facility] with c/o left sided numbness weakness and slurred speech. 06/25/2016  intermittent symptoms of left facial and LUE numbness since his initial presentation.  Reports having initial symptoms followed by recurrent symptoms 5 days later with the same location.  Seen by neurology in the hospital  Since his discharge he is regaining strength in his left hand, still with some numbness in the palm of the left hand. Concerned about restarting his treadmill exercise program He has noticed increased heart rate in the appropriate fashion with exertion.  He is on eliquis 2.5 mg twice a day, most recent creatinine 1.49, age 37 years old Prior creatinine 1.46  Lab work reviewed with him in detail Total cholesterol 119, LDL 58, hemoglobin A1c 5.6 TSH 2.2, CR 1.46   total cholesterol 119, LDL 58, TSH 2.2, A1c 5.6, creatinine 1.5 to  PMH:   has a past medical history of Aortic valve disorders, Arthritis, Atrial flutter (HCC) (06/18/2016), Basal cell carcinoma, BBB (bundle branch block), Chronic back pain, Complete heart block ([place]) (06/2016), GERD (gastroesophageal reflux disease), Heart block, History of gout, History of hiatal hernia, Hyperlipidemia, Hypertension, Lyme disease, Migraine, Presence of permanent cardiac pacemaker (06/19/2016), PVC's (premature ventricular contractions), Renal cancer, left ([place]) (2006), Spinal stenosis, Squamous carcinoma, Stroke ([place]), and TIA (transient ischemic attack) (06/14/2016).  PSH:    Past Surgical History:  Procedure Laterality Date  . ANKLE FRACTURE SURGERY Right 1967  . BASAL CELL CARCINOMA EXCISION     "face, nose left shoulder, left arm"  . BIOPSY PROSTATE  2001 & 2003  . CARDIAC CATHETERIZATION  1990's  . FRACTURE SURGERY    . INGUINAL HERNIA REPAIR Left 2012  . INSERT / REPLACE / REMOVE PACEMAKER  06/19/2016  . LAPAROSCOPIC ABLATION RENAL MASS    . pacemasker    . PROSTATE SURGERY    . SQUAMOUS CELL CARCINOMA EXCISION     "  face, nose left shoulder, left arm"  . TONSILLECTOMY AND ADENOIDECTOMY      Current Outpatient Prescriptions  Medication Sig Dispense Refill  . apixaban (ELIQUIS) 2.5 MG TABS tablet Take 1 tablet (2.5 mg total) by mouth 2 (two) times daily. 60 tablet 11  . atorvastatin (LIPITOR) 40  MG tablet TAKE 1 TABLET BY MOUTH EVERY DAY AT 6PM 90 tablet 3  . azithromycin (ZITHROMAX) 500 MG tablet     . B Complex Vitamins (B-COMPLEX/B-12 PO) Take 1 tablet by mouth daily.    Marland Kitchen co-enzyme Q-10 30 MG capsule Take 30 mg by mouth 3 (three) times daily.    Marland Kitchen COLCRYS 0.6 MG tablet Take 0.6 mg by mouth as needed.     . diclofenac sodium (VOLTAREN) 1 % GEL Place 2 g onto the skin daily as needed (PAIN).     Marland Kitchen doxycycline (VIBRA-TABS) 100 MG tablet Take 100 mg by mouth 2 (two) times daily.    Marland Kitchen esomeprazole (NEXIUM) 40 MG capsule Take 40 mg by mouth daily as needed.     . febuxostat (ULORIC) 40 MG tablet Take 10 mg by mouth daily. TAKES 0.25 TABLET  DAILY    . Garlic 10 MG CAPS Take 10 mg by mouth daily.     . hydrALAZINE (APRESOLINE) 25 MG tablet Take 1 tablet (25 mg total) by mouth 3 (three) times daily as needed. (Patient taking differently: Take 25 mg by mouth 3 (three) times daily as needed. ONLY FOR HIGH BP READINGS) 90 tablet 6  . lisinopril (PRINIVIL,ZESTRIL) 10 MG tablet Take 1 tablet (10 mg total) by mouth daily. 30 tablet 1  . Magnesium Oxide 250 MG TABS Take 250 mg by mouth daily.    . metroNIDAZOLE (FLAGYL) 500 MG tablet     . MULTIPLE VITAMIN PO Take 1 tablet by mouth daily.     . Multiple Vitamins-Minerals (PRESERVISION AREDS PO) Take 1 tablet by mouth daily.    . Multiple Vitamins-Minerals (ZINC PO) Take 1 tablet by mouth daily.    . Omega-3 Fatty Acids (FISH OIL) 1000 MG CAPS Take 1,000 mg by mouth daily.     Marland Kitchen OVER THE COUNTER MEDICATION Take 1 tablet by mouth 2 (two) times daily. Supplement for Immune Support for Lyme Disease DIM    . OVER THE COUNTER MEDICATION Take 5 mLs by mouth 3 (three) times daily. Lauracidine Immune Support for Lyme Disease    . OVER THE COUNTER MEDICATION Take 1 tablet by mouth daily. Betaglucin    . Propylene Glycol-Glycerin (SOOTHE OP) Place 1 drop into both eyes at bedtime.    . rifampin (RIFADIN) 300 MG capsule     . Saccharomyces boulardii  (PROBIOTIC) 250 MG CAPS Take 1 tablet by mouth See admin instructions. TAKES 3 HOURS AFTER DOXYCYCLINE    . terazosin (HYTRIN) 1 MG capsule Take 1 mg by mouth at bedtime.    Marland Kitchen testosterone cypionate (DEPOTESTOSTERONE CYPIONATE) 200 MG/ML injection     . traMADol (ULTRAM) 50 MG tablet     . TURMERIC PO Take 1 tablet by mouth 2 (two) times daily.    . vitamin E 400 UNIT capsule Take 400 Units by mouth daily.      Allergies:   Iodine and Penicillins   Social History:  The patient  reports that  has never smoked. he has never used smokeless tobacco. He reports that he drinks alcohol. He reports that he does not use drugs.   Family History:   family history includes  Aortic stenosis in his mother; Arthritis in his father; Heart attack in his brother; Stroke in his brother.    Review of Systems: Review of Systems  Constitutional: Negative.   Respiratory: Negative.   Cardiovascular: Negative.   Gastrointestinal: Negative.   Musculoskeletal: Negative.   Psychiatric/Behavioral: Negative.   All other systems reviewed and are negative.    PHYSICAL EXAM: VS:  BP 130/78 (BP Location: Left Arm, Patient Position: Sitting, Cuff Size: Normal)   Pulse (!) 53   Ht 5\' 7"  (1.702 m)   Wt 169 lb (76.7 kg)   BMI 26.47 kg/m  , BMI Body mass index is 26.47 kg/m. No significant change in physical exam GEN: Well nourished, well developed, in no acute distress  HEENT: normal  Neck: no JVD, carotid bruits, or masses Cardiac: RRR; no murmurs, rubs, or gallops,no edema  Respiratory:  clear to auscultation bilaterally, normal work of breathing GI: soft, nontender, nondistended, + BS MS: no deformity or atrophy  Skin: warm and dry, no rash Neuro:  Strength and sensation are intact Psych: euthymic mood, full affect    Recent Labs: 07/11/2016: TSH 2.230 10/31/2016: ALT 29; BUN 23; Creatinine, Ser 1.46; Hemoglobin 14.4; Platelets 192; Potassium 4.9; Sodium 134    Lipid Panel Lab Results  Component  Value Date   CHOL 119 07/11/2016   HDL 53 07/11/2016   LDLCALC 58 07/11/2016   TRIG 39 07/11/2016      Wt Readings from Last 3 Encounters:  03/18/17 169 lb (76.7 kg)  02/12/17 169 lb 9.6 oz (76.9 kg)  01/08/17 160 lb (72.6 kg)       ASSESSMENT AND PLAN:  HYPERTENSION, BENIGN - Plan: EKG 12-Lead Previously recommended to add metoprolol, he is not on metoprolol at this time He would like to come off the ACE inhibitor changed to amlodipine 5 mg daily  Other hyperlipidemia - Plan: EKG 12-Lead Cholesterol is at goal on the current lipid regimen. No changes to the medications were made. Stable  Transient cerebral ischemia, unspecified type - Plan: EKG 12-Lead Currently on anticoagulation Eliquis twice a day renal dosing  previous event likely from paroxysmal atrial fibrillation  Cardiac pacemaker in situ - Plan: EKG 12-Lead  pacemaker placed by Dr. Caryl Comes Recent pacemaker down was discussed with him in detail, will download again today Minimal symptoms from paroxysmal atrial fibrillation  Hemiparesis of left dominant side due to non-cerebrovascular etiology ([place]) No TIA or stroke symptoms Tolerating anticoagulation  Complete heart block ([place]) Pacemaker placed, denies any near syncope or syncope  Paroxysmal atrial fibrillation (HCC) Currently not on metoprolol, pacer download to determine atrial fibrillation burden   Coronary artery disease involving native coronary artery of native heart without angina pectoris Currently with no symptoms of angina. No further workup at this time. Continue current medication regimen.  Stable   Disposition:   F/U  6 months   Total encounter time more than 25 minutes  Greater than 50% was spent in counseling and coordination of care with the patient    Orders Placed This Encounter  Procedures  . EKG 12-Lead     Signed, Esmond Plants, M.D., Ph.D. 03/18/2017  [place], [place]

## 2017-03-18 NOTE — Patient Instructions (Addendum)
Medication Instructions:   Ok to change to amlodipine  Labwork:  No new labs needed  Testing/Procedures:  No further testing at this time   Follow-Up: It was a pleasure seeing you in the office today. Please call us if you have new issues that need to be addressed before your next appt.  [phone]  Your physician wants you to follow-up in: 12 months.  You will receive a reminder letter in the mail two months in advance. If you don't receive a letter, please call our office to schedule the follow-up appointment.  If you need a refill on your cardiac medications before your next appointment, please call your pharmacy.

## 2017-03-24 ENCOUNTER — Ambulatory Visit: Payer: Medicare Other | Admitting: Cardiovascular Disease

## 2017-04-03 ENCOUNTER — Other Ambulatory Visit: Payer: Self-pay | Admitting: Cardiovascular Disease

## 2017-04-03 DIAGNOSIS — H02135 Senile ectropion of left lower eyelid: Secondary | ICD-10-CM | POA: Diagnosis not present

## 2017-04-03 NOTE — Telephone Encounter (Signed)
Please review for refill, thanks ! 

## 2017-04-04 ENCOUNTER — Other Ambulatory Visit: Payer: Self-pay | Admitting: *Deleted

## 2017-04-04 ENCOUNTER — Other Ambulatory Visit: Payer: Self-pay

## 2017-04-04 ENCOUNTER — Telehealth: Payer: Self-pay | Admitting: *Deleted

## 2017-04-04 MED ORDER — APIXABAN 5 MG PO TABS: 5.0000 mg | ORAL_TABLET | Freq: Two times a day (BID) | ORAL | 3 refills | Status: DC

## 2017-04-04 NOTE — Telephone Encounter (Signed)
Rx requested received from [facility]. Eliquis 5 mg tablet. #90 DAY supply  #3 refills  Pt has Eliquis 2.5 mg tablet on medlist. Please advise correct dose.

## 2017-04-04 NOTE — Telephone Encounter (Signed)
S/w pt who states Dr. Rockey Situ gave him a written prescription for Eliquis 5mg . He took it to the pharmacy but did not immediately fill it. When he called back, they were unable to locate the medication. Pt meets criteria for Eliquis 5mg . Reviewed with Dr. Rockey Situ. Per his verbal, I have sent in new prescription to [facility]. Pt notified.

## 2017-04-08 DIAGNOSIS — Z85828 Personal history of other malignant neoplasm of skin: Secondary | ICD-10-CM | POA: Diagnosis not present

## 2017-04-08 DIAGNOSIS — L578 Other skin changes due to chronic exposure to nonionizing radiation: Secondary | ICD-10-CM | POA: Diagnosis not present

## 2017-04-08 DIAGNOSIS — L570 Actinic keratosis: Secondary | ICD-10-CM | POA: Diagnosis not present

## 2017-04-08 DIAGNOSIS — B351 Tinea unguium: Secondary | ICD-10-CM | POA: Diagnosis not present

## 2017-04-08 DIAGNOSIS — L720 Epidermal cyst: Secondary | ICD-10-CM | POA: Diagnosis not present

## 2017-05-20 ENCOUNTER — Other Ambulatory Visit: Payer: Self-pay | Admitting: Anesthesiology

## 2017-05-20 ENCOUNTER — Ambulatory Visit (HOSPITAL_BASED_OUTPATIENT_CLINIC_OR_DEPARTMENT_OTHER): Payer: Medicare Other | Admitting: Anesthesiology

## 2017-05-20 ENCOUNTER — Encounter: Payer: Self-pay | Admitting: Anesthesiology

## 2017-05-20 ENCOUNTER — Other Ambulatory Visit: Payer: Self-pay

## 2017-05-20 ENCOUNTER — Ambulatory Visit
Admission: RE | Admit: 2017-05-20 | Discharge: 2017-05-20 | Disposition: A | Discharge status: Home / Self Care | Payer: Medicare Other | Source: Ambulatory Visit | Attending: Anesthesiology | Admitting: Anesthesiology

## 2017-05-20 VITALS — BP 120/88 | HR 56 | Temp 97.8°F | Resp 18 | Ht 67.0 in | Wt 160.0 lb

## 2017-05-20 DIAGNOSIS — Z79899 Other long term (current) drug therapy: Secondary | ICD-10-CM | POA: Diagnosis not present

## 2017-05-20 DIAGNOSIS — M545 Low back pain: Secondary | ICD-10-CM | POA: Insufficient documentation

## 2017-05-20 DIAGNOSIS — R52 Pain, unspecified: Secondary | ICD-10-CM

## 2017-05-20 DIAGNOSIS — Z7901 Long term (current) use of anticoagulants: Secondary | ICD-10-CM | POA: Diagnosis not present

## 2017-05-20 DIAGNOSIS — M5432 Sciatica, left side: Secondary | ICD-10-CM

## 2017-05-20 DIAGNOSIS — M5431 Sciatica, right side: Secondary | ICD-10-CM | POA: Insufficient documentation

## 2017-05-20 DIAGNOSIS — M5136 Other intervertebral disc degeneration, lumbar region: Secondary | ICD-10-CM | POA: Diagnosis not present

## 2017-05-20 DIAGNOSIS — M48061 Spinal stenosis, lumbar region without neurogenic claudication: Secondary | ICD-10-CM

## 2017-05-20 MED ORDER — TRIAMCINOLONE ACETONIDE 40 MG/ML IJ SUSP
40.0000 mg | Freq: Once | INTRAMUSCULAR | Status: AC
  Administered 2017-05-20: 40 mg

## 2017-05-20 MED ORDER — LIDOCAINE HCL (PF) 1 % IJ SOLN
5.0000 mL | Freq: Once | INTRAMUSCULAR | Status: AC
  Administered 2017-05-20: 5 mL via SUBCUTANEOUS

## 2017-05-20 MED ORDER — IOPAMIDOL (ISOVUE-M 200) INJECTION 41%
20.0000 mL | Freq: Once | INTRAMUSCULAR | Status: DC | PRN
  Administered 2017-05-20 (×2): 10 mL
  Filled 2017-05-20 (×2): qty 20

## 2017-05-20 MED ORDER — TRIAMCINOLONE ACETONIDE 40 MG/ML IJ SUSP
INTRAMUSCULAR | Status: AC
  Filled 2017-05-20: qty 1

## 2017-05-20 MED ORDER — LIDOCAINE HCL (PF) 1 % IJ SOLN
INTRAMUSCULAR | Status: AC
Start: 2017-05-20 — End: 2017-05-20
  Filled 2017-05-20: qty 5

## 2017-05-20 MED ORDER — SODIUM CHLORIDE 0.9 % IJ SOLN
INTRAMUSCULAR | Status: AC
  Filled 2017-05-20: qty 10

## 2017-05-20 MED ORDER — SODIUM CHLORIDE 0.9% FLUSH
10.0000 mL | Freq: Once | INTRAVENOUS | Status: AC
  Administered 2017-05-20: 10 mL

## 2017-05-20 MED ORDER — IOPAMIDOL (ISOVUE-M 200) INJECTION 41%
INTRAMUSCULAR | Status: AC
  Filled 2017-05-20: qty 10

## 2017-05-20 MED ORDER — ROPIVACAINE HCL 2 MG/ML IJ SOLN: 10.0000 mL | Freq: Once | INTRAMUSCULAR | Status: DC

## 2017-05-20 NOTE — Progress Notes (Signed)
Safety precautions to be maintained throughout the outpatient stay will include: orient to surroundings, keep bed in low position, maintain call bell within reach at all times, provide assistance with transfer out of bed and ambulation.  

## 2017-05-20 NOTE — Progress Notes (Signed)
Subjective:  Patient ID: Thomas [name], male    DOB: 28-Jun-1932  Age: 84 y.o. MRN: [id number]  CC: Back Pain (lower, worse on right)   Procedure: L5-S1 epidural steroid under fluoroscopic guidance without sedation  HPI Thomas [name] presents for reevaluation.  He was last seen in September.  He had epidural steroid injections and knocked out the vast majority of his lower extremity pain.  He was recently busy doing some rigorous work outdoors and this caused a recurrence of his low back pain and posterior leg pain.  In the past this is been more unilateral however presently it is affecting both legs.  Unfortunately it has been quite severe and despite conservative measures has persisted.  No problems with bowel or bladder function or lower extremity strength have been noted.  Otherwise he is in his usual state of health.  He is responded favorably to epidurals in the past and desires to proceed with a repeat epidural today.  Outpatient Medications Prior to Visit  Medication Sig Dispense Refill  . amLODipine (NORVASC) 5 MG tablet Take 1 tablet (5 mg total) 2 (two) times daily by mouth. 180 tablet 4  . apixaban (ELIQUIS) 5 MG TABS tablet Take 1 tablet (5 mg total) by mouth 2 (two) times daily. 180 tablet 3  . atorvastatin (LIPITOR) 40 MG tablet TAKE 1 TABLET BY MOUTH EVERY DAY AT 6PM 90 tablet 3  . B Complex Vitamins (B-COMPLEX/B-12 PO) Take 1 tablet by mouth daily.    Marland Kitchen co-enzyme Q-10 30 MG capsule Take 30 mg by mouth 3 (three) times daily.    . colchicine (COLCRYS) 0.6 MG tablet Take 1 tablet (0.6 mg total) by mouth as needed. 30 tablet 0  . diclofenac sodium (VOLTAREN) 1 % GEL Place 2 g onto the skin daily as needed (PAIN).     Marland Kitchen dutasteride (AVODART) 0.5 MG capsule Take 0.5 mg by mouth daily.    Marland Kitchen esomeprazole (NEXIUM) 40 MG capsule Take 40 mg by mouth daily as needed.     . febuxostat (ULORIC) 40 MG tablet Take 10 mg by mouth daily. TAKES 0.25 TABLET  DAILY    . Garlic 10 MG CAPS Take 10  mg by mouth daily.     . hydrALAZINE (APRESOLINE) 25 MG tablet Take 1 tablet (25 mg total) by mouth 3 (three) times daily as needed. (Patient taking differently: Take 25 mg by mouth 3 (three) times daily as needed. ONLY FOR HIGH BP READINGS) 90 tablet 6  . Magnesium Oxide 250 MG TABS Take 250 mg by mouth daily.    . metroNIDAZOLE (FLAGYL) 500 MG tablet Takes 1 tablet twice daily for 2 days every week.    . MULTIPLE VITAMIN PO Take 1 tablet by mouth daily. Centrum Silver    . Omega-3 Fatty Acids (FISH OIL) 1000 MG CAPS Take 1,000 mg by mouth daily.     Marland Kitchen OVER THE COUNTER MEDICATION Take 1 tablet by mouth 2 (two) times daily. Supplement for Immune Support for Lyme Disease DIM    . OVER THE COUNTER MEDICATION Take 5 mLs by mouth 3 (three) times daily. Lauracidine Immune Support for Lyme Disease    . OVER THE COUNTER MEDICATION Take 1 tablet by mouth daily. Betaglucin    . propranolol (INDERAL) 20 MG tablet Take 20 mg by mouth daily as needed.     . propranolol (INDERAL) 40 MG tablet TAKE ONE (1) TABLET THREE (3) TIMES EACHDAY AS NEEDED 270 tablet 1  .  Propylene Glycol-Glycerin (SOOTHE OP) Place 1 drop into both eyes at bedtime.    . Saccharomyces boulardii (PROBIOTIC) 250 MG CAPS Take 1 tablet by mouth See admin instructions. TAKES 3 HOURS AFTER DOXYCYCLINE    . silodosin (RAPAFLO) 8 MG CAPS capsule Take 8 mg by mouth daily with breakfast.    . terazosin (HYTRIN) 1 MG capsule Take 1 mg by mouth at bedtime.    Marland Kitchen testosterone cypionate (DEPOTESTOSTERONE CYPIONATE) 200 MG/ML injection Inject 1 mL (200 mg total) every 28 (twenty-eight) days into the muscle. 10 mL 0  . traMADol (ULTRAM) 50 MG tablet Take 50 mg by mouth every 6 (six) hours as needed.     . TURMERIC PO Take 1 tablet by mouth 2 (two) times daily.    . vitamin E 400 UNIT capsule Take 400 Units by mouth daily.    Marland Kitchen lisinopril (PRINIVIL,ZESTRIL) 5 MG tablet Take 1 tablet (5 mg total) by mouth at bedtime. (Patient taking differently: Take 5 mg  by mouth daily. ) 90 tablet 3  . metoprolol succinate (TOPROL-XL) 50 MG 24 hr tablet Take 1 tablet (50 mg total) by mouth daily. Take with or immediately following a meal. 90 tablet 3   No facility-administered medications prior to visit.     Review of Systems CNS: No confusion or sedation Cardiac: No angina or palpitations GI: No abdominal pain or constipation Constitutional: No nausea vomiting fever chills  Objective:  BP 120/84   Pulse (!) 56   Temp 97.8 F (36.6 C) (Oral)   Resp 18   Ht 5\' 7"  (1.702 m)   Wt 160 lb (72.6 kg)   SpO2 99%   BMI 25.06 kg/m    BP Readings from Last 3 Encounters:  05/20/17 120/84  03/18/17 130/78  02/12/17 139/77     Wt Readings from Last 3 Encounters:  05/20/17 160 lb (72.6 kg)  03/18/17 169 lb (76.7 kg)  02/12/17 169 lb 9.6 oz (76.9 kg)     Physical Exam Pt is alert and oriented PERRL EOMI HEART IS RRR no murmur or rub LCTA no wheezing or rales MUSCULOSKELETAL persistent pain in the paraspinous muscle tenderness but no overt trigger points.  His muscle tone and bulk in the lower extremities is good.  Labs  Lab Results  Component Value Date   HGBA1C 5.6 07/11/2016   HGBA1C 5.5 06/14/2016   Lab Results  Component Value Date   LDLCALC 58 07/11/2016   CREATININE 1.46 (H) 10/31/2016    -------------------------------------------------------------------------------------------------------------------- Lab Results  Component Value Date   WBC 6.3 10/31/2016   HGB 14.4 10/31/2016   HCT 42.4 10/31/2016   PLT 192 10/31/2016   GLUCOSE 107 (H) 10/31/2016   CHOL 119 07/11/2016   TRIG 39 07/11/2016   HDL 53 07/11/2016   LDLCALC 58 07/11/2016   ALT 29 10/31/2016   AST 34 10/31/2016   NA 134 10/31/2016   K 4.9 10/31/2016   CL 98 10/31/2016   CREATININE 1.46 (H) 10/31/2016   BUN 23 10/31/2016   CO2 24 10/31/2016   TSH 2.230 07/11/2016   PSA 5.8 08/22/2014   INR 1.06 06/25/2016   HGBA1C 5.6 07/11/2016     --------------------------------------------------------------------------------------------------------------------- No results found.   Assessment & Plan:   Thomas [name] was seen today for back pain.  Diagnoses and all orders for this visit:  DDD (degenerative disc disease), lumbar  Spinal stenosis of lumbar region without neurogenic claudication  Bilateral sciatica  Other orders -     triamcinolone acetonide (  KENALOG-40) injection 40 mg -     sodium chloride flush (NS) 0.9 % injection 10 mL -     ropivacaine (PF) 2 mg/mL (0.2%) (NAROPIN) injection 10 mL -     lidocaine (PF) (XYLOCAINE) 1 % injection 5 mL -     iopamidol (ISOVUE-M) 41 % intrathecal injection 20 mL        ----------------------------------------------------------------------------------------------------------------------  Problem List Items Addressed This Visit    None    Visit Diagnoses    DDD (degenerative disc disease), lumbar    -  Primary   Relevant Medications   triamcinolone acetonide (KENALOG-40) injection 40 mg (Completed)   Spinal stenosis of lumbar region without neurogenic claudication       Bilateral sciatica            ----------------------------------------------------------------------------------------------------------------------  1. DDD (degenerative disc disease), lumbar We will proceed with a repeat epidural injection today.  The risks and benefits of been once again reviewed all questions answered no guarantees are made.  Want him to continue with his current stretching strengthening program.  We talked about concerns with running however the seems to be better for him and better tolerated than walking.  We will have him return to clinic on a as needed basis.  2. Spinal stenosis of lumbar region without neurogenic claudication As above  3. Bilateral  sciatica Above    ----------------------------------------------------------------------------------------------------------------------  I am having Thomas [name] maintain his Fish Oil, vitamin E, esomeprazole, febuxostat, VOLTAREN, MULTIPLE VITAMIN PO, Garlic, co-enzyme P-59, Probiotic, hydrALAZINE, B Complex Vitamins (B-COMPLEX/B-12 PO), Magnesium Oxide, OVER THE COUNTER MEDICATION, OVER THE COUNTER MEDICATION, TURMERIC PO, OVER THE COUNTER MEDICATION, Propylene Glycol-Glycerin (SOOTHE OP), terazosin, metroNIDAZOLE, traMADol, atorvastatin, propranolol, propranolol, lisinopril, metoprolol succinate, dutasteride, silodosin, colchicine, testosterone cypionate, amLODipine, and apixaban. We administered triamcinolone acetonide, sodium chloride flush, lidocaine (PF), and iopamidol.   Meds ordered this encounter  Medications  . triamcinolone acetonide (KENALOG-40) injection 40 mg  . sodium chloride flush (NS) 0.9 % injection 10 mL  . ropivacaine (PF) 2 mg/mL (0.2%) (NAROPIN) injection 10 mL  . lidocaine (PF) (XYLOCAINE) 1 % injection 5 mL  . iopamidol (ISOVUE-M) 41 % intrathecal injection 20 mL   Patient's Medications  New Prescriptions   No medications on file  Previous Medications   AMLODIPINE (NORVASC) 5 MG TABLET    Take 1 tablet (5 mg total) 2 (two) times daily by mouth.   APIXABAN (ELIQUIS) 5 MG TABS TABLET    Take 1 tablet (5 mg total) by mouth 2 (two) times daily.   ATORVASTATIN (LIPITOR) 40 MG TABLET    TAKE 1 TABLET BY MOUTH EVERY DAY AT 6PM   B COMPLEX VITAMINS (B-COMPLEX/B-12 PO)    Take 1 tablet by mouth daily.   CO-ENZYME Q-10 30 MG CAPSULE    Take 30 mg by mouth 3 (three) times daily.   COLCHICINE (COLCRYS) 0.6 MG TABLET    Take 1 tablet (0.6 mg total) by mouth as needed.   DICLOFENAC SODIUM (VOLTAREN) 1 % GEL    Place 2 g onto the skin daily as needed (PAIN).    DUTASTERIDE (AVODART) 0.5 MG CAPSULE    Take 0.5 mg by mouth daily.   ESOMEPRAZOLE (NEXIUM) 40 MG CAPSULE    Take  40 mg by mouth daily as needed.    FEBUXOSTAT (ULORIC) 40 MG TABLET    Take 10 mg by mouth daily. TAKES 0.25 TABLET  DAILY   GARLIC 10 MG CAPS    Take 10 mg by  mouth daily.    HYDRALAZINE (APRESOLINE) 25 MG TABLET    Take 1 tablet (25 mg total) by mouth 3 (three) times daily as needed.   LISINOPRIL (PRINIVIL,ZESTRIL) 5 MG TABLET    Take 1 tablet (5 mg total) by mouth at bedtime.   MAGNESIUM OXIDE 250 MG TABS    Take 250 mg by mouth daily.   METOPROLOL SUCCINATE (TOPROL-XL) 50 MG 24 HR TABLET    Take 1 tablet (50 mg total) by mouth daily. Take with or immediately following a meal.   METRONIDAZOLE (FLAGYL) 500 MG TABLET    Takes 1 tablet twice daily for 2 days every week.   MULTIPLE VITAMIN PO    Take 1 tablet by mouth daily. Centrum Silver   OMEGA-3 FATTY ACIDS (FISH OIL) 1000 MG CAPS    Take 1,000 mg by mouth daily.    OVER THE COUNTER MEDICATION    Take 1 tablet by mouth 2 (two) times daily. Supplement for Immune Support for Lyme Disease DIM   OVER THE COUNTER MEDICATION    Take 5 mLs by mouth 3 (three) times daily. Lauracidine Immune Support for Lyme Disease   OVER THE COUNTER MEDICATION    Take 1 tablet by mouth daily. Betaglucin   PROPRANOLOL (INDERAL) 20 MG TABLET    Take 20 mg by mouth daily as needed.    PROPRANOLOL (INDERAL) 40 MG TABLET    TAKE ONE (1) TABLET THREE (3) TIMES EACHDAY AS NEEDED   PROPYLENE GLYCOL-GLYCERIN (SOOTHE OP)    Place 1 drop into both eyes at bedtime.   SACCHAROMYCES BOULARDII (PROBIOTIC) 250 MG CAPS    Take 1 tablet by mouth See admin instructions. TAKES 3 HOURS AFTER DOXYCYCLINE   SILODOSIN (RAPAFLO) 8 MG CAPS CAPSULE    Take 8 mg by mouth daily with breakfast.   TERAZOSIN (HYTRIN) 1 MG CAPSULE    Take 1 mg by mouth at bedtime.   TESTOSTERONE CYPIONATE (DEPOTESTOSTERONE CYPIONATE) 200 MG/ML INJECTION    Inject 1 mL (200 mg total) every 28 (twenty-eight) days into the muscle.   TRAMADOL (ULTRAM) 50 MG TABLET    Take 50 mg by mouth every 6 (six) hours as needed.     TURMERIC PO    Take 1 tablet by mouth 2 (two) times daily.   VITAMIN E 400 UNIT CAPSULE    Take 400 Units by mouth daily.  Modified Medications   No medications on file  Discontinued Medications   No medications on file   ----------------------------------------------------------------------------------------------------------------------  Follow-up: Return if symptoms worsen or fail to improve.  Procedure: L5-S1 epidural steroid under fluoroscopic guidance without sedation   Procedure: 5 S1 LESI with fluoroscopic guidance and moderate sedation  NOTE: The risks, benefits, and expectations of the procedure have been discussed and explained to the patient who was understanding and in agreement with suggested treatment plan. No guarantees were made.  DESCRIPTION OF PROCEDURE: Lumbar epidural steroid injection with no IV Versed, EKG, blood pressure, pulse, and pulse oximetry monitoring. The procedure was performed with the patient in the prone position under fluoroscopic guidance.  Sterile prep x3 was initiated and I then injected subcutaneous lidocaine to the overlying 5 S1 site after its fluoroscopic identifictation.  Using strict aseptic technique, I then advanced an 18-gauge Tuohy epidural needle in the midline using interlaminar approach via loss-of-resistance to saline technique. There was negative aspiration for heme or  CSF.  I then confirmed position with both AP and Lateral fluoroscan.  No cc of Isovue  were injected and a  total of 5 mL of Preservative-Free normal saline mixed with 40 mg of Kenalog and 1cc Ropicaine 0.2 percent were injected incrementally via the  epidurally placed needle. The needle was removed. The patient tolerated the injection well and was convalesced and discharged to home in stable condition. Should the patient have any post procedure difficulty they have been instructed on how to contact us for assistance.    Molli Barrows, MD

## 2017-05-20 NOTE — Patient Instructions (Signed)
Epidural Steroid Injection Patient Information  Description: The epidural space surrounds the nerves as they exit the spinal cord.  In some patients, the nerves can be compressed and inflamed by a bulging disc or a tight spinal canal (spinal stenosis).  By injecting steroids into the epidural space, we can bring irritated nerves into direct contact with a potentially helpful medication.  These steroids act directly on the irritated nerves and can reduce swelling and inflammation which often leads to decreased pain.  Epidural steroids may be injected anywhere along the spine and from the neck to the low back depending upon the location of your pain.   After numbing the skin with local anesthetic (like Novocaine), a small needle is passed into the epidural space slowly.  You may experience a sensation of pressure while this is being done.  The entire block usually last less than 10 minutes.  Conditions which may be treated by epidural steroids:   Low back and leg pain  Neck and arm pain  Spinal stenosis  Post-laminectomy syndrome  Herpes zoster (shingles) pain  Pain from compression fractures  Preparation for the injection:  1. Do not eat any solid food or dairy products within 8 hours of your appointment.  2. You may drink clear liquids up to 3 hours before appointment.  Clear liquids include water, black coffee, juice or soda.  No milk or cream please. 3. You may take your regular medication, including pain medications, with a sip of water before your appointment  Diabetics should hold regular insulin (if taken separately) and take 1/2 normal NPH dos the morning of the procedure.  Carry some sugar containing items with you to your appointment. 4. A driver must accompany you and be prepared to drive you home after your procedure.  5. Bring all your current medications with your. 6. An IV may be inserted and sedation may be given at the discretion of the physician.   7. A blood pressure  cuff, EKG and other monitors will often be applied during the procedure.  Some patients may need to have extra oxygen administered for a short period. 8. You will be asked to provide medical information, including your allergies, prior to the procedure.  We must know immediately if you are taking blood thinners (like Coumadin/Warfarin)  Or if you are allergic to IV iodine contrast (dye). We must know if you could possible be pregnant.  Possible side-effects:  Bleeding from needle site  Infection (rare, may require surgery)  Nerve injury (rare)  Numbness & tingling (temporary)  Difficulty urinating (rare, temporary)  Spinal headache ( a headache worse with upright posture)  Light -headedness (temporary)  Pain at injection site (several days)  Decreased blood pressure (temporary)  Weakness in arm/leg (temporary)  Pressure sensation in back/neck (temporary)  Call if you experience:  Fever/chills associated with headache or increased back/neck pain.  Headache worsened by an upright position.  New onset weakness or numbness of an extremity below the injection site  Hives or difficulty breathing (go to the emergency room)  Inflammation or drainage at the infection site  Severe back/neck pain  Any new symptoms which are concerning to you  Please note:  Although the local anesthetic injected can often make your back or neck feel good for several hours after the injection, the pain will likely return.  It takes 3-7 days for steroids to work in the epidural space.  You may not notice any pain relief for at least that one week.    If effective, we will often do a series of three injections spaced 3-6 weeks apart to maximally decrease your pain.  After the initial series, we generally will wait several months before considering a repeat injection of the same type.  If you have any questions, please call [phone] [facility]  Discharge Instructions  General Discharge Instructions :  If you need to reach your doctor call: Monday-Friday 8:00 am - 4:00 pm at [phone] or toll free [phone].  After clinic hours [phone] to have operator reach doctor.  Bring all of your medication bottles to all your appointments in the pain clinic.  To cancel or reschedule your appointment with Pain Management please remember to call 24 hours in advance to avoid a fee.  Refer to the educational materials which you have been given on: General Risks, I had my Procedure. Discharge Instructions, Post Sedation.  Post Procedure Instructions:  The drugs you were given will stay in your system until tomorrow, so for the next 24 hours you should not drive, make any legal decisions or drink any alcoholic beverages.  You may eat anything you prefer, but it is better to start with liquids then soups and crackers, and gradually work up to solid foods.  Please notify your doctor immediately if you have any unusual bleeding, trouble breathing or pain that is not related to your normal pain.  Depending on the type of procedure that was done, some parts of your body may feel week and/or numb.  This usually clears up by tonight or the next day.  Walk with the use of an assistive device or accompanied by an adult for the 24 hours.  You may use ice on the affected area for the first 24 hours.  Put ice in a Ziploc bag and cover with a towel and place against area 15 minutes on 15 minutes off.  You may switch to heat after 24 hours. 

## 2017-05-21 ENCOUNTER — Telehealth: Payer: Self-pay | Admitting: *Deleted

## 2017-05-21 NOTE — Telephone Encounter (Signed)
Denies complications post procedure. 

## 2017-06-04 ENCOUNTER — Other Ambulatory Visit: Payer: Self-pay | Admitting: Internal Medicine

## 2017-06-19 ENCOUNTER — Telehealth: Payer: Self-pay | Admitting: Cardiovascular Disease

## 2017-06-19 NOTE — Telephone Encounter (Signed)
Spoke with patient and he reports increased pvc's and was unsure what could have caused it. Reviewed some things that could make it worse and he states that it did stop and he also has medication he can take as needed. He states that he feels better and that he has not had anymore at this time. Confirmed upcoming appointment information with him and he verbalized understanding to call back if this should return or persist. He was appreciative for the call with no further questions.

## 2017-06-19 NOTE — Telephone Encounter (Signed)
Patient having increased pvc's and cannot think of a reason why   Today having trigeminy and this is new for him   Scheduled with dr. Caryl Comes on 2/26 for rov   Please call patient to advise / discuss

## 2017-07-03 NOTE — Progress Notes (Signed)
Cardiology Office Note  Date:  07/04/2017   ID:  Thomas [name], DOB 07-24-1932, MRN [id number]  PCP:  Thomas [name]., MD   Chief Complaint  Patient presents with  . OTHER    PVC's last couple of weeks more frequent. Meds reviewed verbally with pt.    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, Does not appreciate atrial fibrillation He does appreciate PVCs  Previous pacer download reviewed with him at least 2 hours of atrial fibrillation per day Predominantly ventricularly paced He was taking propranolol 10 mg 3-4 times per day for PVCs several weeks ago than they seem to resolve on their own now stop the propranolol Denied any stressors that could have caused ectopy   Reports blood pressure has been well controlled on current regimen He does not want to change anything  Previous sleep study, was told he had paroxysmal flutter/fib  EKG personally reviewed by myself on todays visit Atrial sensed ventricular paced rhythm rate 62 bpm   Other past medical history reviewed  Recent admission to hospital with SOB, found to be in heart block 06/2016 permanent pacemaker implantation (06/19/2016),   presented to the  Hospital with c/o left sided numbness weakness and slurred speech. 06/25/2016  intermittent symptoms of left facial and LUE numbness since his initial presentation.  Reports having initial  symptoms followed by recurrent symptoms 5 days later with the same location. Seen by neurology in the hospital  Since his discharge he is regaining strength in his left hand, still with some numbness in the palm of the left hand. Concerned about restarting his treadmill exercise program He has noticed increased heart rate in the appropriate fashion with exertion.  He is on eliquis 2.5 mg twice a day, most recent creatinine 1.59, age 86 years old Prior creatinine 1.46  Lab work reviewed with him in detail Total cholesterol 119, LDL 58, hemoglobin A1c 5.6 TSH 2.2, CR 1.46   total cholesterol 119, LDL 58, TSH 2.2, A1c 5.6, creatinine 1.5 to  PMH:   has a past medical history of Aortic valve disorders, Arthritis, Atrial flutter (HCC) (06/18/2016), Basal cell carcinoma, BBB (bundle branch block), Chronic back pain, Complete heart block ([place]) (06/2016), GERD (gastroesophageal reflux disease), Heart block, History of gout, History of hiatal hernia, Hyperlipidemia, Hypertension, Lyme disease, Migraine, Presence of permanent cardiac pacemaker (06/19/2016), PVC's (premature ventricular contractions), Renal cancer, left ([place]) (2006), Spinal stenosis, Squamous carcinoma, Stroke ([place]), and TIA (transient ischemic attack) (06/14/2016).  PSH:    Past Surgical History:  Procedure Laterality Date  . ANKLE FRACTURE SURGERY Right 1967  . BASAL CELL CARCINOMA EXCISION     "face, nose left shoulder, left arm"  . BIOPSY PROSTATE  2001 & 2003  . CARDIAC CATHETERIZATION  1990's  . FRACTURE SURGERY    . INGUINAL HERNIA REPAIR Left 2012  . INSERT / REPLACE / REMOVE PACEMAKER  06/19/2016  . LAPAROSCOPIC ABLATION RENAL MASS    .  PACEMAKER IMPLANT N/A 06/19/2016   Procedure: Pacemaker Implant;  Surgeon: Deboraha Sprang, MD;  Location: [place];  Service: Cardiovascular;  Laterality: N/A;  . pacemasker    . PROSTATE SURGERY    . SQUAMOUS CELL CARCINOMA EXCISION     "face, nose left shoulder, left arm"  .  TONSILLECTOMY AND ADENOIDECTOMY     Current Outpatient Medications on File Prior to Visit  Medication Sig Dispense Refill  . amLODipine (NORVASC) 5 MG tablet Take 1 tablet (5 mg total) 2 (two) times daily by mouth. 180 tablet 4  . apixaban (ELIQUIS) 5 MG TABS tablet Take 1 tablet (5 mg total) by mouth 2 (two) times daily. 180 tablet 3  . atorvastatin (LIPITOR) 40 MG tablet TAKE 1 TABLET BY MOUTH EVERY DAY AT 6PM 90 tablet 3  . B Complex Vitamins (B-COMPLEX/B-12 PO) Take 1 tablet by mouth daily.    Marland Kitchen co-enzyme Q-10 30 MG capsule Take 30 mg by mouth 3 (three) times daily.    . colchicine (COLCRYS) 0.6 MG tablet Take 1 tablet (0.6 mg total) by mouth as needed. 30 tablet 0  . diclofenac sodium (VOLTAREN) 1 % GEL Place 2 g onto the skin daily as needed (PAIN).     Marland Kitchen dutasteride (AVODART) 0.5 MG capsule Take 0.5 mg by mouth as needed.     Marland Kitchen esomeprazole (NEXIUM) 40 MG capsule Take 40 mg by mouth daily as needed.     . febuxostat (ULORIC) 40 MG tablet Take 10 mg by mouth daily. TAKES 0.25 TABLET  DAILY    . Garlic 10 MG CAPS Take 10 mg by mouth daily.     . hydrALAZINE (APRESOLINE) 25 MG tablet Take 1 tablet (25 mg total) by mouth 3 (three) times daily as needed. (Patient taking differently: Take 25 mg by mouth 3 (three) times daily as needed. ONLY FOR HIGH BP READINGS) 90 tablet 6  . lisinopril (PRINIVIL,ZESTRIL) 5 MG tablet Take 1 tablet (5 mg total) by mouth at bedtime. (Patient taking differently: Take 5 mg by mouth daily. ) 90 tablet 3  . Magnesium Oxide 250 MG TABS Take 250 mg by mouth daily.    . metoprolol succinate (TOPROL-XL) 50 MG 24 hr tablet Take 1 tablet (50 mg total) by mouth daily. Take with or immediately following a meal. 90 tablet 3  . metroNIDAZOLE (FLAGYL) 500 MG tablet Takes 1 tablet twice daily for 2 days every week.    . MULTIPLE VITAMIN PO Take 1 tablet by mouth daily. Centrum Silver    . Omega-3 Fatty Acids (FISH OIL) 1000 MG CAPS Take 1,000 mg by mouth daily.     Marland Kitchen OVER THE  COUNTER MEDICATION Take 1 tablet by mouth 2 (two) times daily. Supplement for Immune Support for Lyme Disease DIM    . OVER THE COUNTER MEDICATION Take 5 mLs by mouth 3 (three) times daily. Lauracidine Immune Support for Lyme Disease    . OVER THE COUNTER MEDICATION Take 1 tablet by mouth daily. Betaglucin    . propranolol (INDERAL) 40 MG tablet TAKE ONE (1) TABLET THREE (3) TIMES EACHDAY AS NEEDED 270 tablet 1  . Propylene Glycol-Glycerin (SOOTHE OP) Place 1 drop into both eyes at bedtime.    . Saccharomyces boulardii (PROBIOTIC) 250 MG CAPS Take 1 tablet by mouth See admin instructions. TAKES 3 HOURS AFTER DOXYCYCLINE    . silodosin (RAPAFLO) 8 MG CAPS capsule Take 8 mg by mouth daily with breakfast.    . terazosin (HYTRIN)  1 MG capsule Take 1 mg by mouth at bedtime.    Marland Kitchen testosterone cypionate (DEPOTESTOSTERONE CYPIONATE) 200 MG/ML injection Inject 1 mL (200 mg total) every 28 (twenty-eight) days into the muscle. 10 mL 0  . traMADol (ULTRAM) 50 MG tablet Take 50 mg by mouth every 6 (six) hours as needed.     . TURMERIC PO Take 1 tablet by mouth 2 (two) times daily.    . vitamin E 400 UNIT capsule Take 400 Units by mouth daily.     No current facility-administered medications on file prior to visit.    Allergies:   Iodine and Penicillins   Social History:  The patient  reports that  has never smoked. he has never used smokeless tobacco. He reports that he drinks alcohol. He reports that he does not use drugs.   Family History:   family history includes Aortic stenosis in his mother; Arthritis in his father; Heart attack in his brother; Stroke in his brother.    Review of Systems: Review of Systems  Constitutional: Negative.   Respiratory: Negative.   Cardiovascular: Positive for palpitations.  Gastrointestinal: Negative.   Musculoskeletal: Negative.   Psychiatric/Behavioral: Negative.   All other systems reviewed and are negative.    PHYSICAL EXAM: VS:  BP 122/70 (BP Location:  Left Arm, Patient Position: Sitting, Cuff Size: Normal)   Pulse 62   Ht 5\' 7"  (1.702 m)   Wt 173 lb 4 oz (78.6 kg)   BMI 27.13 kg/m  , BMI Body mass index is 27.13 kg/m. Constitutional:  oriented to person, place, and time. No distress.  HENT:  Head: Normocephalic and atraumatic.  Eyes:  no discharge. No scleral icterus.  Neck: Normal range of motion. Neck supple. No JVD present.  Cardiovascular: Normal rate, regular rhythm, normal heart sounds and intact distal pulses. Exam reveals no gallop and no friction rub. No edema No murmur heard. Pulmonary/Chest: Effort normal and breath sounds normal. No stridor. No respiratory distress.  no wheezes.  no rales.  no tenderness.  Abdominal: Soft.  no distension.  no tenderness.  Musculoskeletal: Normal range of motion.  no  tenderness or deformity.  Neurological:  normal muscle tone. Coordination normal. No atrophy Skin: Skin is warm and dry. No rash noted. not diaphoretic.  Psychiatric:  normal mood and affect. behavior is normal. Thought content normal.   Recent Labs: 07/11/2016: TSH 2.230 10/31/2016: ALT 29; BUN 23; Creatinine, Ser 1.46; Hemoglobin 14.4; Platelets 192; Potassium 4.9; Sodium 134    Lipid Panel Lab Results  Component Value Date   CHOL 119 07/11/2016   HDL 53 07/11/2016   LDLCALC 58 07/11/2016   TRIG 39 07/11/2016      Wt Readings from Last 3 Encounters:  07/04/17 173 lb 4 oz (78.6 kg)  05/20/17 160 lb (72.6 kg)  03/18/17 169 lb (76.7 kg)      ASSESSMENT AND PLAN:  HYPERTENSION, BENIGN - Plan: EKG 12-Lead Blood pressure is well controlled on today's visit. No changes made to the medications.  Other hyperlipidemia - Plan: EKG 12-Lead Cholesterol is at goal on the current lipid regimen. No changes to the medications were made.  Transient cerebral ischemia, unspecified type - Plan: EKG 12-Lead Currently on anticoagulation Eliquis twice a day renal dosing  previous event likely from paroxysmal atrial fibrillation   Stable, no further events  Cardiac pacemaker in situ - Plan: EKG 12-Lead  pacemaker placed by Dr. Caryl Comes, has follow-up in 1 month Pacemaker download discussed with him in detail  V paced rhythm, essentially asymptomatic except for PVCs  Hemiparesis of left dominant side due to non-cerebrovascular etiology (HCC) Tolerating anticoagulation, no further symptoms  Complete heart block ([place]) Pacemaker dependent , denies any near syncope or syncope  Paroxysmal atrial fibrillation (HCC) Taking metoprolol, propranolol as needed for ectopy Otherwise asymptomatic  Coronary artery disease involving native coronary artery of native heart without angina pectoris Currently with no symptoms of angina. No further workup at this time. Continue current medication regimen.  Stable   Disposition:   F/U  12 months   Total encounter time more than 25 minutes  Greater than 50% was spent in counseling and coordination of care with the patient    No orders of the defined types were placed in this encounter.    Signed, Esmond Plants, M.D., Ph.D. 07/04/2017  [place], [place]

## 2017-07-04 ENCOUNTER — Ambulatory Visit: Payer: Medicare Other | Admitting: Cardiovascular Disease

## 2017-07-04 ENCOUNTER — Encounter: Payer: Self-pay | Admitting: Cardiovascular Disease

## 2017-07-04 VITALS — BP 122/70 | HR 62 | Ht 67.0 in | Wt 173.2 lb

## 2017-07-04 DIAGNOSIS — E782 Mixed hyperlipidemia: Secondary | ICD-10-CM

## 2017-07-04 DIAGNOSIS — I480 Paroxysmal atrial fibrillation: Secondary | ICD-10-CM | POA: Diagnosis not present

## 2017-07-04 DIAGNOSIS — I442 Atrioventricular block, complete: Secondary | ICD-10-CM

## 2017-07-04 DIAGNOSIS — I639 Cerebral infarction, unspecified: Secondary | ICD-10-CM

## 2017-07-04 DIAGNOSIS — I6509 Occlusion and stenosis of unspecified vertebral artery: Secondary | ICD-10-CM

## 2017-07-04 DIAGNOSIS — I10 Essential (primary) hypertension: Secondary | ICD-10-CM

## 2017-07-04 DIAGNOSIS — I2510 Atherosclerotic heart disease of native coronary artery without angina pectoris: Secondary | ICD-10-CM

## 2017-07-04 DIAGNOSIS — Z950 Presence of cardiac pacemaker: Secondary | ICD-10-CM

## 2017-07-04 NOTE — Patient Instructions (Signed)

## 2017-07-29 ENCOUNTER — Encounter: Payer: Self-pay | Admitting: Internal Medicine

## 2017-07-29 ENCOUNTER — Ambulatory Visit (INDEPENDENT_AMBULATORY_CARE_PROVIDER_SITE_OTHER): Payer: Medicare Other | Admitting: Internal Medicine

## 2017-07-29 ENCOUNTER — Encounter: Payer: Medicare Other | Admitting: Internal Medicine

## 2017-07-29 VITALS — BP 138/80 | HR 57 | Ht 67.0 in | Wt 176.8 lb

## 2017-07-29 DIAGNOSIS — Z950 Presence of cardiac pacemaker: Secondary | ICD-10-CM | POA: Diagnosis not present

## 2017-07-29 DIAGNOSIS — I480 Paroxysmal atrial fibrillation: Secondary | ICD-10-CM

## 2017-07-29 DIAGNOSIS — I442 Atrioventricular block, complete: Secondary | ICD-10-CM | POA: Diagnosis not present

## 2017-07-29 DIAGNOSIS — R011 Cardiac murmur, unspecified: Secondary | ICD-10-CM

## 2017-07-29 DIAGNOSIS — R0609 Other forms of dyspnea: Secondary | ICD-10-CM

## 2017-07-29 NOTE — Progress Notes (Signed)
Patient Care Team: Jerrol Banana., MD as PCP - General (Family Medicine) Minna Merritts, MD as Consulting Physician (Cardiology) Anne Hahn, MD (Family Medicine) Deboraha Sprang, MD as Consulting Physician (Cardiology) Leandrew Koyanagi, MD as Referring Physician (Ophthalmology)   HPI  Thomas [name] is a 84 y.o. male Seen in follow-up for pacemaker implanted 2/18 for complete heart block. He also has paroxysmal atrial flutter  on anticoagulation his CHADS-VASc score is greater than or equal to 6 with a prior TIA.  Echocardiogram 2/18 EF 55-60% with normal right-sided function -this represented interval normalization  He has complaints of worsening exercise tolerance.  He has noted some shortness of breath.  He has not had chest discomfort apart from fleeting discomforts.  He has known remote peripheral coronary disease.  No recent evaluation and no prior interventions.   there is been no edema nocturnal dyspnea or orthopnea.  Tolerating apixaban as well as the nose without bleeding.   Date Cr Hgb  6/18 1.46 14.4            Records and Results Reviewed creatinine in the 1.46-8 range    Past Medical History:  Diagnosis Date  . Aortic valve disorders   . Arthritis   . Atrial flutter ([place]) 06/18/2016   "AF or AFl; not sure which" (06/23/2016)  . Basal cell carcinoma    "face, nose left shoulder, left arm" (06/19/2016)  . BBB (bundle branch block)    hx right  . Chronic back pain    "neck, thoracic, lower back" (06/19/2016)  . Complete heart block ([place]) 06/2016  . GERD (gastroesophageal reflux disease)   . Heart block    "I've had type I, II [place] before now" (06/19/2016)  . History of gout   . History of hiatal hernia    "self dx'd" (06/19/2016)  . Hyperlipidemia   . Hypertension   . Lyme disease    "dx'd by me 2003; cx's showed dx 08/2015"  . Migraine    "3-4/year" (06/19/2016)  . Presence of permanent cardiac pacemaker 06/19/2016  . PVC's  (premature ventricular contractions)   . Renal cancer, left ([place]) 2006   S/P cryotherapy  . Spinal stenosis    "cervical, 1 thoracic, lumbar" (06/19/2016)  . Squamous carcinoma    "face, nose left shoulder, left arm" (06/19/2016)  . Stroke ([place])   . TIA (transient ischemic attack) 06/14/2016   "I'm not sure that's what it was" (06/25/2016)    Past Surgical History:  Procedure Laterality Date  . ANKLE FRACTURE SURGERY Right 1967  . BASAL CELL CARCINOMA EXCISION     "face, nose left shoulder, left arm"  . BIOPSY PROSTATE  2001 & 2003  . CARDIAC CATHETERIZATION  1990's  . FRACTURE SURGERY    . INGUINAL HERNIA REPAIR Left 2012  . INSERT / REPLACE / REMOVE PACEMAKER  06/19/2016  . LAPAROSCOPIC ABLATION RENAL MASS    . PACEMAKER IMPLANT N/A 06/19/2016   Procedure: Pacemaker Implant;  Surgeon: Deboraha Sprang, MD;  Location: [facility];  Service: Cardiovascular;  Laterality: N/A;  . pacemasker    . PROSTATE SURGERY    . SQUAMOUS CELL CARCINOMA EXCISION     "face, nose left shoulder, left arm"  . TONSILLECTOMY AND ADENOIDECTOMY      Current Outpatient Medications  Medication Sig Dispense Refill  . amLODipine (NORVASC) 5 MG tablet Take 1 tablet (5 mg total) 2 (two) times daily by mouth. 180 tablet 4  .  apixaban (ELIQUIS) 5 MG TABS tablet Take 1 tablet (5 mg total) by mouth 2 (two) times daily. 180 tablet 3  . atorvastatin (LIPITOR) 40 MG tablet TAKE 1 TABLET BY MOUTH EVERY DAY AT 6PM 90 tablet 3  . B Complex Vitamins (B-COMPLEX/B-12 PO) Take 1 tablet by mouth daily.    Marland Kitchen co-enzyme Q-10 30 MG capsule Take 30 mg by mouth 3 (three) times daily.    . colchicine (COLCRYS) 0.6 MG tablet Take 1 tablet (0.6 mg total) by mouth as needed. 30 tablet 0  . diclofenac sodium (VOLTAREN) 1 % GEL Place 2 g onto the skin daily as needed (PAIN).     Marland Kitchen dutasteride (AVODART) 0.5 MG capsule Take 0.5 mg by mouth as needed.     Marland Kitchen esomeprazole (NEXIUM) 40 MG capsule Take 40 mg by mouth daily as needed.      . febuxostat (ULORIC) 40 MG tablet Take 10 mg by mouth daily. TAKES 0.25 TABLET  DAILY    . Garlic 10 MG CAPS Take 10 mg by mouth daily.     . hydrALAZINE (APRESOLINE) 25 MG tablet Take 1 tablet (25 mg total) by mouth 3 (three) times daily as needed. (Patient taking differently: Take 25 mg by mouth 3 (three) times daily as needed. ONLY FOR HIGH BP READINGS) 90 tablet 6  . Magnesium Oxide 250 MG TABS Take 250 mg by mouth daily.    . metroNIDAZOLE (FLAGYL) 500 MG tablet Takes 1 tablet twice daily for 2 days every week.    . MULTIPLE VITAMIN PO Take 1 tablet by mouth daily. Centrum Silver    . Omega-3 Fatty Acids (FISH OIL) 1000 MG CAPS Take 1,000 mg by mouth daily.     Marland Kitchen OVER THE COUNTER MEDICATION Take 1 tablet by mouth 2 (two) times daily. Supplement for Immune Support for Lyme Disease DIM    . OVER THE COUNTER MEDICATION Take 5 mLs by mouth 3 (three) times daily. Lauracidine Immune Support for Lyme Disease    . OVER THE COUNTER MEDICATION Take 1 tablet by mouth daily. Betaglucin    . propranolol (INDERAL) 40 MG tablet TAKE ONE (1) TABLET THREE (3) TIMES EACHDAY AS NEEDED 270 tablet 1  . Propylene Glycol-Glycerin (SOOTHE OP) Place 1 drop into both eyes at bedtime.    . Saccharomyces boulardii (PROBIOTIC) 250 MG CAPS Take 1 tablet by mouth See admin instructions. TAKES 3 HOURS AFTER DOXYCYCLINE    . silodosin (RAPAFLO) 8 MG CAPS capsule Take 8 mg by mouth daily with breakfast.    . terazosin (HYTRIN) 1 MG capsule Take 1 mg by mouth at bedtime.    Marland Kitchen testosterone cypionate (DEPOTESTOSTERONE CYPIONATE) 200 MG/ML injection Inject 1 mL (200 mg total) every 28 (twenty-eight) days into the muscle. 10 mL 0  . traMADol (ULTRAM) 50 MG tablet Take 50 mg by mouth every 6 (six) hours as needed.     . TURMERIC PO Take 1 tablet by mouth 2 (two) times daily.    . vitamin E 400 UNIT capsule Take 400 Units by mouth daily.    . metoprolol succinate (TOPROL-XL) 50 MG 24 hr tablet Take 1 tablet (50 mg total) by  mouth daily. Take with or immediately following a meal. 90 tablet 3   No current facility-administered medications for this visit.     Allergies  Allergen Reactions  . Iodine Hives    IVP contrast  . Penicillins Itching    ITCHY FEELING IN FINGERS Has patient had a PCN  reaction causing immediate rash, facial/tongue/throat swelling, SOB or lightheadedness with hypotension: No Has patient had a PCN reaction causing severe rash involving mucus membranes or skin necrosis: No Has patient had a PCN reaction that required hospitalization No Has patient had a PCN reaction occurring within the last 10 years: No If all of the above answers are "NO", then may proceed with Cephalosporin use.       Review of Systems negative except from HPI and PMH  Physical Exam BP 138/80 (BP Location: Left Arm, Patient Position: Sitting, Cuff Size: Normal)   Pulse (!) 57   Ht 5\' 7"  (1.702 m)   Wt 176 lb 12 oz (80.2 kg)   BMI 27.68 kg/m  Well developed and nourished in no acute distress HENT normal Neck supple with JVP-8 Clear Regular rate and rhythm, 2/6 holosystolic Abd-soft with active BS No Clubbing cyanosis edema Skin-warm and dry A & Oriented  Grossly normal sensory and motor function     ECG  Afib with complete heart block and V pacing  Assessment and Plan:  Atrial fibrillation-persistent  TIA  Coronary artery disease-remote  Complete heart block    Hypertension  Pacemaker Medtronic   Renal insufficiency grade 3  Sleep disordered breathing  Chronic Lyme disease  DOE/CHF    The patient has new dyspnea on exertion.  The he is having more atrial fibrillation.  The differential diagnosis also includes pacemaker associated cardiomyopathy given that there is no evidence of venous distention as well as a mitral regurgitation murmur.  We will undertake an echocardiogram.  He has a known history of coronary artery disease.  I have discussed with Dr. Deidre Ala; he would prefer to proceed  with CT and FFR for further evaluation.  His atrial fibrillation burden is increasing.  It is possible that that is contributing to his symptoms also.  We have discussed undertaking a antiarrhythmic suppression trial asked the question as to whether it is contributing versus what his concern is and that his chronic Lyme disease.  In the event that he were to have symptomatic improvement with maintaining sinus rhythm, not withstanding his age, he might be a candidate for catheter ablation as opposed to long-term antiarrhythmic therapy.  No bleeding.  But will check labs.  His creatinine is borderline for re-dosing of his apixaban.  More than 50% of 40 min was spent in counseling related to the above

## 2017-07-29 NOTE — Patient Instructions (Addendum)
Medication Instructions: - Your physician recommends that you continue on your current medications as directed. Please refer to the Current Medication list given to you today.  Labwork: - Your physician recommends that you have lab work today: CMET/ CBC/ TSH  Procedures/Testing: - Your physician has requested that you have an echocardiogram. Echocardiography is a painless test that uses sound waves to create images of your heart. It provides your doctor with information about the size and shape of your heart and how well your heart's chambers and valves are working. This procedure takes approximately one hour. There are no restrictions for this procedure.  - Your physician has recommended that you have a Coronary CT angio (you will be contacted by our [facility] to arrange a date/ time for you)  Please arrive at the [facility] main entrance of [facility] at ____________ AM (30-45 minutes prior to test start time)  [facility] [place], [place] [address] [phone]  Proceed to the [facility] Radiology Department (First Floor).  Please follow these instructions carefully (unless otherwise directed):  Hold all erectile dysfunction medications at least 48 hours prior to test.  On the Night Before the Test: . Drink plenty of water. . Do not consume any caffeinated/decaffeinated beverages or chocolate 12 hours prior to your test. . Do not take any antihistamines 12 hours prior to your test. . If you take Metformin do not take 24 hours prior to test. . If the patient has contrast allergy: ? Patient will need a prescription for Prednisone and very clear instructions (as follows): 1. Prednisone 50 mg - take 13 hours prior to test 2. Take another Prednisone 50 mg 7 hours prior to test 3. Take another Prednisone 50 mg 1 hour prior to test 4. Take Benadryl 50 mg 1 hour prior to test . Patient must complete all four doses of above prophylactic  medications. . Patient will need a ride after test due to Benadryl.  On the Day of the Test: . Drink plenty of water. Do not drink any water within one hour of the test. . Do not eat any food 4 hours prior to the test. . You may take your regular medications prior to the test. . IF NOT ON A BETA BLOCKER - Take 50 mg of lopressor (metoprolol) one hour before the test. . HOLD Furosemide morning of the test.  After the Test: . Drink plenty of water. . After receiving IV contrast, you may experience a mild flushed feeling. This is normal. . On occasion, you may experience a mild rash up to 24 hours after the test. This is not dangerous. If this occurs, you can take Benadryl 25 mg and increase your fluid intake. . If you experience trouble breathing, this can be serious. If it is severe call 911 IMMEDIATELY. If it is mild, please call our office. . If you take any of these medications: Glipizide/Metformin, Avandament, Glucavance, please do not take 48 hours after completing test.   Follow-Up: - Remote monitoring is used to monitor your Pacemaker of ICD from home. This monitoring reduces the number of office visits required to check your device to one time per year. It allows [place] to keep an eye on the functioning of your device to ensure it is working properly. You are scheduled for a device check from home on 10/28/17. You may send your transmission at any time that day. If you have a wireless device, the transmission will be sent  automatically. After your physician reviews your transmission, you will receive a postcard with your next transmission date.   - Your physician wants you to follow-up in: 1 year with Dr. Caryl Comes. You will receive a reminder letter in the mail two months in advance. If you don't receive a letter, please call our office to schedule the follow-up appointment.   Any Additional Special Instructions Will Be Listed Below (If Applicable).     If you need a refill on your  cardiac medications before your next appointment, please call your pharmacy.

## 2017-07-30 LAB — CBC WITH DIFFERENTIAL/PLATELET
BASOS: 0 %
Basophils Absolute: 0 10*3/uL (ref 0.0–0.2)
EOS (ABSOLUTE): 0.2 10*3/uL (ref 0.0–0.4)
EOS: 3 %
HEMATOCRIT: 45.9 % (ref 37.5–51.0)
HEMOGLOBIN: 15.3 g/dL (ref 13.0–17.7)
Immature Grans (Abs): 0 10*3/uL (ref 0.0–0.1)
Immature Granulocytes: 0 %
LYMPHS ABS: 2.1 10*3/uL (ref 0.7–3.1)
Lymphs: 30 %
MCH: 30.2 pg (ref 26.6–33.0)
MCHC: 33.3 g/dL (ref 31.5–35.7)
MCV: 91 fL (ref 79–97)
Monocytes Absolute: 1.1 10*3/uL — ABNORMAL HIGH (ref 0.1–0.9)
Monocytes: 16 %
Neutrophils Absolute: 3.7 10*3/uL (ref 1.4–7.0)
Neutrophils: 51 %
Platelets: 182 10*3/uL (ref 150–379)
RBC: 5.07 x10E6/uL (ref 4.14–5.80)
RDW: 15.6 % — ABNORMAL HIGH (ref 12.3–15.4)
WBC: 7.1 10*3/uL (ref 3.4–10.8)

## 2017-07-30 LAB — COMPREHENSIVE METABOLIC PANEL
ALBUMIN: 3.9 g/dL (ref 3.5–4.7)
ALK PHOS: 82 IU/L (ref 39–117)
ALT: 31 IU/L (ref 0–44)
AST: 23 IU/L (ref 0–40)
Albumin/Globulin Ratio: 1.4 (ref 1.2–2.2)
BUN / CREAT RATIO: 10 (ref 10–24)
BUN: 16 mg/dL (ref 8–27)
Bilirubin Total: 0.4 mg/dL (ref 0.0–1.2)
CALCIUM: 9.5 mg/dL (ref 8.6–10.2)
CO2: 26 mmol/L (ref 20–29)
CREATININE: 1.6 mg/dL — AB (ref 0.76–1.27)
Chloride: 100 mmol/L (ref 96–106)
GFR calc Af Amer: 46 mL/min/{1.73_m2} — ABNORMAL LOW (ref >59)
GFR, EST NON AFRICAN AMERICAN: 40 mL/min/{1.73_m2} — AB (ref >59)
GLUCOSE: 98 mg/dL (ref 65–99)
Globulin, Total: 2.7 g/dL (ref 1.5–4.5)
Potassium: 4.9 mmol/L (ref 3.5–5.2)
Sodium: 139 mmol/L (ref 134–144)
TOTAL PROTEIN: 6.6 g/dL (ref 6.0–8.5)

## 2017-07-30 LAB — TSH: TSH: 2.84 u[IU]/mL (ref 0.450–4.500)

## 2017-07-31 ENCOUNTER — Ambulatory Visit (INDEPENDENT_AMBULATORY_CARE_PROVIDER_SITE_OTHER): Payer: Medicare Other | Admitting: Family Medicine

## 2017-07-31 ENCOUNTER — Encounter: Payer: Self-pay | Admitting: Family Medicine

## 2017-07-31 VITALS — BP 124/70 | HR 53 | Temp 98.3°F | Resp 16 | Wt 175.0 lb

## 2017-07-31 DIAGNOSIS — R0602 Shortness of breath: Secondary | ICD-10-CM | POA: Diagnosis not present

## 2017-07-31 DIAGNOSIS — R0609 Other forms of dyspnea: Secondary | ICD-10-CM | POA: Diagnosis not present

## 2017-07-31 MED ORDER — DOXYCYCLINE HYCLATE 100 MG PO TABS: 100.0000 mg | ORAL_TABLET | Freq: Two times a day (BID) | ORAL | 6 refills | Status: DC | PRN

## 2017-07-31 MED ORDER — ALBUTEROL SULFATE 108 (90 BASE) MCG/ACT IN AEPB: 1.0000 | INHALATION_SPRAY | RESPIRATORY_TRACT | 11 refills | Status: DC | PRN

## 2017-07-31 NOTE — Patient Instructions (Addendum)
Dyspnea on exertion  Referred to [facility] Pulmonology. Given rx for albuterol inhaler 1-2 puffs every 4 hours as needed.  Follow up as needed.

## 2017-07-31 NOTE — Progress Notes (Signed)
Patient: Thomas [name] Male    DOB: 09/08/1932   84 y.o.   MRN: [id number] Visit Date: 07/31/2017  Today's Provider: [name], MD   Chief Complaint  Patient presents with  . Cough   Subjective:    Patient saw Dr. Caryl Comes 07/29/2017. Dr. Caryl Comes has suspicions that the patient has COPD. Patient has had a chronic cough for a couple of months which concerned Dr. Caryl Comes. Dr. Caryl Comes advised patient to see pcp for follow-up. Patient states when he exerts himself, he will cough and become short of breath.   He is unable to do the level of exercise he has done in the past.     Allergies  Allergen Reactions  . Iodine Hives    IVP contrast  . Penicillins Itching    ITCHY FEELING IN FINGERS Has patient had a PCN reaction causing immediate rash, facial/tongue/throat swelling, SOB or lightheadedness with hypotension: No Has patient had a PCN reaction causing severe rash involving mucus membranes or skin necrosis: No Has patient had a PCN reaction that required hospitalization No Has patient had a PCN reaction occurring within the last 10 years: No If all of the above answers are "NO", then may proceed with Cephalosporin use.      Current Outpatient Medications:  .  amLODipine (NORVASC) 5 MG tablet, Take 1 tablet (5 mg total) 2 (two) times daily by mouth., Disp: 180 tablet, Rfl: 4 .  apixaban (ELIQUIS) 5 MG TABS tablet, Take 1 tablet (5 mg total) by mouth 2 (two) times daily., Disp: 180 tablet, Rfl: 3 .  B Complex Vitamins (B-COMPLEX/B-12 PO), Take 1 tablet by mouth daily., Disp: , Rfl:  .  co-enzyme Q-10 30 MG capsule, Take 30 mg by mouth 3 (three) times daily., Disp: , Rfl:  .  colchicine (COLCRYS) 0.6 MG tablet, Take 1 tablet (0.6 mg total) by mouth as needed., Disp: 30 tablet, Rfl: 0 .  diclofenac sodium (VOLTAREN) 1 % GEL, Place 2 g onto the skin daily as needed (PAIN). , Disp: , Rfl:  .  dutasteride (AVODART) 0.5 MG capsule, Take 0.5 mg by mouth as needed. , Disp: , Rfl:  .   esomeprazole (NEXIUM) 40 MG capsule, Take 40 mg by mouth daily as needed. , Disp: , Rfl:  .  febuxostat (ULORIC) 40 MG tablet, Take 10 mg by mouth daily. TAKES 0.25 TABLET  DAILY, Disp: , Rfl:  .  Garlic 10 MG CAPS, Take 10 mg by mouth daily. , Disp: , Rfl:  .  hydrALAZINE (APRESOLINE) 25 MG tablet, Take 1 tablet (25 mg total) by mouth 3 (three) times daily as needed. (Patient taking differently: Take 25 mg by mouth 3 (three) times daily as needed. ONLY FOR HIGH BP READINGS), Disp: 90 tablet, Rfl: 6 .  Magnesium Oxide 250 MG TABS, Take 250 mg by mouth daily., Disp: , Rfl:  .  metoprolol succinate (TOPROL-XL) 50 MG 24 hr tablet, Take 1 tablet (50 mg total) by mouth daily. Take with or immediately following a meal., Disp: 90 tablet, Rfl: 3 .  metroNIDAZOLE (FLAGYL) 500 MG tablet, Takes 1 tablet twice daily for 2 days every week., Disp: , Rfl:  .  MULTIPLE VITAMIN PO, Take 1 tablet by mouth daily. Centrum Silver, Disp: , Rfl:  .  OVER THE COUNTER MEDICATION, Take 1 tablet by mouth 2 (two) times daily. Supplement for Immune Support for Lyme Disease DIM, Disp: , Rfl:  .  OVER THE COUNTER MEDICATION,  Take 5 mLs by mouth 3 (three) times daily. Lauracidine Immune Support for Lyme Disease, Disp: , Rfl:  .  OVER THE COUNTER MEDICATION, Take 1 tablet by mouth daily. Betaglucin, Disp: , Rfl:  .  propranolol (INDERAL) 40 MG tablet, TAKE ONE (1) TABLET THREE (3) TIMES EACHDAY AS NEEDED, Disp: 270 tablet, Rfl: 1 .  Propylene Glycol-Glycerin (SOOTHE OP), Place 1 drop into both eyes at bedtime., Disp: , Rfl:  .  Saccharomyces boulardii (PROBIOTIC) 250 MG CAPS, Take 1 tablet by mouth See admin instructions. TAKES 3 HOURS AFTER DOXYCYCLINE, Disp: , Rfl:  .  silodosin (RAPAFLO) 8 MG CAPS capsule, Take 8 mg by mouth daily with breakfast., Disp: , Rfl:  .  terazosin (HYTRIN) 1 MG capsule, Take 1 mg by mouth at bedtime., Disp: , Rfl:  .  testosterone cypionate (DEPOTESTOSTERONE CYPIONATE) 200 MG/ML injection, Inject 1 mL  (200 mg total) every 28 (twenty-eight) days into the muscle., Disp: 10 mL, Rfl: 0 .  traMADol (ULTRAM) 50 MG tablet, Take 50 mg by mouth every 6 (six) hours as needed. , Disp: , Rfl:  .  TURMERIC PO, Take 1 tablet by mouth 2 (two) times daily., Disp: , Rfl:  .  atorvastatin (LIPITOR) 40 MG tablet, TAKE 1 TABLET BY MOUTH EVERY DAY AT 6PM (Patient not taking: Reported on 07/31/2017), Disp: 90 tablet, Rfl: 3 .  Omega-3 Fatty Acids (FISH OIL) 1000 MG CAPS, Take 1,000 mg by mouth daily. , Disp: , Rfl:  .  vitamin E 400 UNIT capsule, Take 400 Units by mouth daily., Disp: , Rfl:   Review of Systems  Constitutional: Negative for appetite change, chills and fever.  HENT: Negative.   Eyes: Negative.   Respiratory: Negative for chest tightness, shortness of breath and wheezing.   Cardiovascular: Negative for chest pain and palpitations.  Gastrointestinal: Negative for abdominal pain, nausea and vomiting.  Endocrine: Negative.   Musculoskeletal: Negative.   Allergic/Immunologic: Negative.   Neurological: Negative.   Psychiatric/Behavioral: Negative.     Social History   Tobacco Use  . Smoking status: Never Smoker  . Smokeless tobacco: Never Used  Substance Use Topics  . Alcohol use: Yes    Alcohol/week: 0.0 - 1.2 oz    Comment: occasionally   Objective:   BP 124/70 (BP Location: Right Arm, Patient Position: Sitting, Cuff Size: Large)   Pulse (!) 53   Temp 98.3 F (36.8 C) (Oral)   Resp 16   Wt 175 lb (79.4 kg)   SpO2 95%   BMI 27.41 kg/m  Vitals:   07/31/17 1154  BP: 124/70  Pulse: (!) 53  Resp: 16  Temp: 98.3 F (36.8 C)  TempSrc: Oral  SpO2: 95%  Weight: 175 lb (79.4 kg)     Physical Exam  Constitutional: He is oriented to person, place, and time. He appears well-developed and well-nourished.  HENT:  Head: Normocephalic and atraumatic.  Right Ear: External ear normal.  Left Ear: External ear normal.  Nose: Nose normal.  Eyes: Conjunctivae are normal.  Neck: No  thyromegaly present.  Cardiovascular: Normal rate, regular rhythm and normal heart sounds.  Pulmonary/Chest: Effort normal and breath sounds normal.  Abdominal: Soft.  Neurological: He is alert and oriented to person, place, and time.  Skin: Skin is warm and dry.  Psychiatric: He has a normal mood and affect. His behavior is normal. Judgment and thought content normal.        Assessment & Plan:     1. Shortness of breath  Possible mild COPD./asthma. - Spirometry with Graph - Ambulatory referral to Pulmonology  2. Dyspnea on exertion  - Ambulatory referral to Pulmonology 3.AFib 4.Complete Heart Block/Pacemaker 5.CKD 6.Chronic Lyme Disease     I have done the exam and reviewed the above chart and it is accurate to the best of my knowledge. Development worker, community has been used in this note in any air is in the dictation or transcription are unintentional.  [name], MD  [place]

## 2017-08-06 NOTE — Progress Notes (Signed)
[facility] Consultation      Assessment and Plan:  Dyspnea on exertion with COPD. -We will start Brio inhaler 1 puff daily. -Patient is asked to take 2 puffs of albuterol before exercise.  Deconditioning. -Patient has a very high level of fitness, this recently was reduced after a period of inactivity, likely representing mild muscular deconditioning. - He appears to be doing better currently, continue exercise, and will monitor progress now that he is started on an inhaler.   Date: 08/07/2017  MRN# [id number] Thomas [name] 08/06/1932    Thomas [name] is a 84 y.o. old male seen in consultation for chief complaint of:    Chief Complaint  Patient presents with  . Consult    Referred by Dr. Rosanna Randy eval possible copd  . Cough  . Shortness of Breath  . Wheezing    HPI:   84 year old male referred dyspnea and cough. Over the past he has feels that he has "perceived" shortness of breath, he has never really paid attention before. He had a pacemaker placed about a year ago. Before his cardiac issues he could run about 6 miles, as his heart issues developed he went slower and slower.  Since the pacemaker he has improved, he has more energy, he has some issues of bronchitis and wheeze when running for the first half mile, then things open up and he can run 4 miles. He does this without difficulty. He was tried on an albuterol inhaler and he tried it before he ran and noticed no difference.  His wife and present and notes that he has been winded occasionally, winded when they went swimming recently which is new for him.  He had allergy tested several years ago, he does not recall any issues. 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   He has never been diagnosed respiratory problems, he never smoked, [name] smoked. He worked as a Dealer, he still does some open Furniture conservator/restorer.    **Imaging personally view chest x-ray 07/08/16; hyperinflation consistent with  emphysema.  CT chest 10/04/09, normal lungs. **Spirometry tracings personally reviewed, 07/23/17 FVC 74% predicted, FEV1 74% predicted, no bronchodilator was administered, ratio 70%.  Test is consistent with mild restrictive lung disease. **CBC 07/29/17; Abs Eos=200.   PMHX:   Past Medical History:  Diagnosis Date  . Aortic valve disorders   . Arthritis   . Atrial flutter ([place]) 06/18/2016   "AF or AFl; not sure which" (06/23/2016)  . Basal cell carcinoma    "face, nose left shoulder, left arm" (06/19/2016)  . BBB (bundle branch block)    hx right  . Chronic back pain    "neck, thoracic, lower back" (06/19/2016)  . Complete heart block ([place]) 06/2016  . GERD (gastroesophageal reflux disease)   . Heart block    "I've had type I, II Wenke before now" (06/19/2016)  . History of gout   . History of hiatal hernia    "self dx'd" (06/19/2016)  . Hyperlipidemia   . Hypertension   . Lyme disease    "dx'd by me 2003; cx's showed dx 08/2015"  . Migraine    "3-4/year" (06/19/2016)  . Presence of permanent cardiac pacemaker 06/19/2016  . PVC's (premature ventricular contractions)   . Renal cancer, left ([place]) 2006   S/P cryotherapy  . Spinal stenosis    "cervical, 1 thoracic, lumbar" (06/19/2016)  . Squamous carcinoma    "face, nose left shoulder, left arm" (06/19/2016)  . Stroke ([place])   .  TIA (transient ischemic attack) 06/14/2016   "I'm not sure that's what it was" (06/25/2016)   Surgical Hx:  Past Surgical History:  Procedure Laterality Date  . ANKLE FRACTURE SURGERY Right 1967  . BASAL CELL CARCINOMA EXCISION     "face, nose left shoulder, left arm"  . BIOPSY PROSTATE  2001 & 2003  . CARDIAC CATHETERIZATION  1990's  . FRACTURE SURGERY    . INGUINAL HERNIA REPAIR Left 2012  . INSERT / REPLACE / REMOVE PACEMAKER  06/19/2016  . LAPAROSCOPIC ABLATION RENAL MASS    . PACEMAKER IMPLANT N/A 06/19/2016   Procedure: Pacemaker Implant;  Surgeon: Deboraha Sprang, MD;  Location: [facility];   Service: Cardiovascular;  Laterality: N/A;  . pacemasker    . PROSTATE SURGERY    . SQUAMOUS CELL CARCINOMA EXCISION     "face, nose left shoulder, left arm"  . TONSILLECTOMY AND ADENOIDECTOMY     Family Hx:  Family History  Problem Relation Age of Onset  . Heart attack Brother   . Stroke Brother   . Aortic stenosis Mother   . Arthritis Father    Social Hx:   Social History   Tobacco Use  . Smoking status: Never Smoker  . Smokeless tobacco: Never Used  Substance Use Topics  . Alcohol use: Yes    Alcohol/week: 0.0 - 1.2 oz    Comment: occasionally  . Drug use: No   Medication:    Current Outpatient Medications:  .  Albuterol Sulfate 108 (90 Base) MCG/ACT AEPB, Inhale 1-2 puffs into the lungs every 4 (four) hours as needed., Disp: 1 each, Rfl: 11 .  amLODipine (NORVASC) 5 MG tablet, Take 1 tablet (5 mg total) 2 (two) times daily by mouth., Disp: 180 tablet, Rfl: 4 .  apixaban (ELIQUIS) 5 MG TABS tablet, Take 1 tablet (5 mg total) by mouth 2 (two) times daily., Disp: 180 tablet, Rfl: 3 .  B Complex Vitamins (B-COMPLEX/B-12 PO), Take 1 tablet by mouth daily., Disp: , Rfl:  .  co-enzyme Q-10 30 MG capsule, Take 30 mg by mouth 3 (three) times daily., Disp: , Rfl:  .  colchicine (COLCRYS) 0.6 MG tablet, Take 1 tablet (0.6 mg total) by mouth as needed., Disp: 30 tablet, Rfl: 0 .  diclofenac sodium (VOLTAREN) 1 % GEL, Place 2 g onto the skin daily as needed (PAIN). , Disp: , Rfl:  .  dutasteride (AVODART) 0.5 MG capsule, Take 0.5 mg by mouth as needed. , Disp: , Rfl:  .  esomeprazole (NEXIUM) 40 MG capsule, Take 40 mg by mouth daily as needed. , Disp: , Rfl:  .  febuxostat (ULORIC) 40 MG tablet, Take 10 mg by mouth daily. TAKES 0.25 TABLET  DAILY, Disp: , Rfl:  .  Garlic 10 MG CAPS, Take 10 mg by mouth daily. , Disp: , Rfl:  .  hydrALAZINE (APRESOLINE) 25 MG tablet, Take 1 tablet (25 mg total) by mouth 3 (three) times daily as needed. (Patient taking differently: Take 25 mg by mouth 3  (three) times daily as needed. ONLY FOR HIGH BP READINGS), Disp: 90 tablet, Rfl: 6 .  Magnesium Oxide 250 MG TABS, Take 250 mg by mouth daily., Disp: , Rfl:  .  metroNIDAZOLE (FLAGYL) 500 MG tablet, Takes 1 tablet twice daily for 2 days every week., Disp: , Rfl:  .  MULTIPLE VITAMIN PO, Take 1 tablet by mouth daily. Centrum Silver, Disp: , Rfl:  .  Omega-3 Fatty Acids (FISH OIL) 1000 MG  CAPS, Take 1,000 mg by mouth daily. , Disp: , Rfl:  .  OVER THE COUNTER MEDICATION, Take 1 tablet by mouth 2 (two) times daily. Supplement for Immune Support for Lyme Disease DIM, Disp: , Rfl:  .  OVER THE COUNTER MEDICATION, Take 5 mLs by mouth 3 (three) times daily. Lauracidine Immune Support for Lyme Disease, Disp: , Rfl:  .  OVER THE COUNTER MEDICATION, Take 1 tablet by mouth daily. Betaglucin, Disp: , Rfl:  .  propranolol (INDERAL) 40 MG tablet, TAKE ONE (1) TABLET THREE (3) TIMES EACHDAY AS NEEDED, Disp: 270 tablet, Rfl: 1 .  Propylene Glycol-Glycerin (SOOTHE OP), Place 1 drop into both eyes at bedtime., Disp: , Rfl:  .  Saccharomyces boulardii (PROBIOTIC) 250 MG CAPS, Take 1 tablet by mouth See admin instructions. TAKES 3 HOURS AFTER DOXYCYCLINE, Disp: , Rfl:  .  silodosin (RAPAFLO) 8 MG CAPS capsule, Take 8 mg by mouth daily with breakfast., Disp: , Rfl:  .  terazosin (HYTRIN) 1 MG capsule, Take 1 mg by mouth at bedtime., Disp: , Rfl:  .  testosterone cypionate (DEPOTESTOSTERONE CYPIONATE) 200 MG/ML injection, Inject 1 mL (200 mg total) every 28 (twenty-eight) days into the muscle., Disp: 10 mL, Rfl: 0 .  traMADol (ULTRAM) 50 MG tablet, Take 50 mg by mouth every 6 (six) hours as needed. , Disp: , Rfl:  .  TURMERIC PO, Take 1 tablet by mouth 2 (two) times daily., Disp: , Rfl:  .  vitamin E 400 UNIT capsule, Take 400 Units by mouth daily., Disp: , Rfl:  .  atorvastatin (LIPITOR) 40 MG tablet, TAKE 1 TABLET BY MOUTH EVERY DAY AT 6PM (Patient not taking: Reported on 08/07/2017), Disp: 90 tablet, Rfl: 3 .   metoprolol succinate (TOPROL-XL) 50 MG 24 hr tablet, Take 1 tablet (50 mg total) by mouth daily. Take with or immediately following a meal., Disp: 90 tablet, Rfl: 3   Allergies:  Iodine and Penicillins  Review of Systems: Gen:  Denies  fever, sweats, chills HEENT: Denies blurred vision, double vision. bleeds, sore throat Cvc:  No dizziness, chest pain. Resp:   Denies cough or sputum production, shortness of breath Gi: Denies swallowing difficulty, stomach pain. Gu:  Denies bladder incontinence, burning urine Ext:   No Joint pain, stiffness. Skin: No skin rash,  hives  Endoc:  No polyuria, polydipsia. Psych: No depression, insomnia. Other:  All other systems were reviewed with the patient and were negative other that what is mentioned in the HPI.   Physical Examination:   VS: BP (!) 150/96 (BP Location: Left Arm, Cuff Size: Normal)   Pulse 82   SpO2 94%   General Appearance: No distress  Neuro:without focal findings,  speech normal,  HEENT: PERRLA, EOM intact.   Pulmonary: normal breath sounds, No wheezing.  CardiovascularNormal S1,S2.  No m/r/g.   Abdomen: Benign, Soft, non-tender. Renal:  No costovertebral tenderness  GU:  No performed at this time. Endoc: No evident thyromegaly, no signs of acromegaly. Skin:   warm, no rashes, no ecchymosis  Extremities: normal, no cyanosis, clubbing.  Other findings:    LABORATORY PANEL:   CBC No results for input(s): WBC, HGB, HCT, PLT in the last 168 hours. ------------------------------------------------------------------------------------------------------------------  Chemistries  No results for input(s): NA, K, CL, CO2, GLUCOSE, BUN, CREATININE, CALCIUM, MG, AST, ALT, ALKPHOS, BILITOT in the last 168 hours.  Invalid input(s): GFRCGP ------------------------------------------------------------------------------------------------------------------  Cardiac Enzymes No results for input(s): TROPONINI in the last 168  hours. ------------------------------------------------------------  RADIOLOGY:  No  results found.     Thank  you for the consultation and for allowing [facility] to assist in the care of your patient. Our recommendations are noted above.  Please contact us if we can be of further service.   Marda Stalker, MD.  Board Certified in Internal Medicine, Pulmonary Medicine, [place], and Sleep Medicine.   Pulmonary and Critical Care Office Number: [phone]  Patricia Pesa, M.D.  Merton Border, M.D  08/07/2017

## 2017-08-07 ENCOUNTER — Ambulatory Visit: Payer: Medicare Other | Admitting: Internal Medicine

## 2017-08-07 ENCOUNTER — Encounter: Payer: Self-pay | Admitting: Internal Medicine

## 2017-08-07 VITALS — BP 150/96 | HR 82

## 2017-08-07 DIAGNOSIS — J410 Simple chronic bronchitis: Secondary | ICD-10-CM | POA: Diagnosis not present

## 2017-08-07 MED ORDER — FLUTICASONE FUROATE-VILANTEROL 100-25 MCG/INH IN AEPB: 1.0000 | INHALATION_SPRAY | Freq: Every day | RESPIRATORY_TRACT | 5 refills | Status: DC

## 2017-08-07 MED ORDER — FLUTICASONE FUROATE-VILANTEROL 100-25 MCG/INH IN AEPB: 1.0000 | INHALATION_SPRAY | Freq: Every day | RESPIRATORY_TRACT | 0 refills | Status: DC

## 2017-08-07 NOTE — Patient Instructions (Addendum)
Take 2 puffs albuterol before exercise.  Will start Breo inhaler one puff daily, rinse mouth after use.

## 2017-08-13 ENCOUNTER — Other Ambulatory Visit: Payer: Self-pay

## 2017-08-13 ENCOUNTER — Ambulatory Visit (INDEPENDENT_AMBULATORY_CARE_PROVIDER_SITE_OTHER): Payer: Medicare Other

## 2017-08-13 DIAGNOSIS — R011 Cardiac murmur, unspecified: Secondary | ICD-10-CM | POA: Diagnosis not present

## 2017-08-14 ENCOUNTER — Encounter: Payer: Self-pay | Admitting: Cardiovascular Disease

## 2017-08-14 ENCOUNTER — Telehealth: Payer: Self-pay | Admitting: General Practice

## 2017-08-14 NOTE — Telephone Encounter (Signed)
MyChart message received from the patient requesting his echo results. I called and notified the patient of the preliminary findings on his echo. He is aware Dr. Caryl Comes will be back next week to sign off on the final report and I will call him back. He voices understanding and is agreeable.

## 2017-08-18 ENCOUNTER — Telehealth: Payer: Self-pay | Admitting: Internal Medicine

## 2017-08-18 NOTE — Telephone Encounter (Signed)
Patient wife Thomas [name] calling States she has not heard anything from scheduling for the CT Coronary Fractional Flow and would like know the status of when that will be scheduled - says it has been 3 weeks Please call to discuss

## 2017-08-19 NOTE — Telephone Encounter (Signed)
S/w patient's wife, ok per DPR. Patient and wife are concerned that CT Coronary Fractionated flow has not been scheduled yet.  Wife is concerned that if patient does not get the CT coronary fractionated flow done soon is this going to affect how patient's atrial fibrillation is treated.  Advised her that scheduling for the CT coronary may take a month or so to do.   Also, concerned primarily about the shortness of breath with activity worse over the past month and a half.  Supposed to leave June 5th to go out of the country. Dr Caryl Comes mentioned putting patient on Amiodarone or Tikosyn start in the hospital. She said the last download showed increase in how much he's been in atrial fib. Patient is interested in doing Tikosyn in order to get a handle on his atrial fib.  Patient would prefer Tikosyn over amiodarone due to the long-term effects of amiodarone.  Advised I will route to Nira Conn, Dr Olin Pia nurse, as well as Dr Caryl Comes to advice on plan of care. She was very Patent attorney.

## 2017-08-20 NOTE — Telephone Encounter (Signed)
Message received from Thomas [name] regarding Cardiac CT- scheduled on 09/10/17.  He has an allergy to idodine- hives- will discuss with Dr. Caryl Comes regarding pre-medication.

## 2017-08-21 LAB — CUP PACEART INCLINIC DEVICE CHECK
Implantable Lead Implant Date: 20180214
Implantable Lead Model: 5076
Implantable Lead Model: 5076
Implantable Pulse Generator Implant Date: 20180214
MDC IDC LEAD IMPLANT DT: 20180214
MDC IDC LEAD LOCATION: 753859
MDC IDC LEAD LOCATION: 753860
MDC IDC SESS DTM: 20190418121523

## 2017-08-21 NOTE — Telephone Encounter (Signed)
I left a message for the patient/ his wife to call.   Need to address:  1) Echo results confirmed by Dr. Caryl Comes.  2) ok per Dr. Caryl Comes if the patient wants to go for [place] admission- does he want to go prior to 5/8 or does he want to have his Cardiac CT and then meet with the a-fib clinic the same day for Tikosyn admission? He will need to have an appointment with the a-fib clinic for [place] admission and initiation.  3) confirmed with Dr. Caryl Comes he will need to be pre-medicated for his iodine allergy prior to Cardiac CT being done.  Please arrive at the [facility] main entrance of [facility] at xx:xx AM (30-45 minutes prior to test start time)  [facility] [address], [place] [address] [phone]  Proceed to the [facility] Radiology Department (First Floor).  Please follow these instructions carefully (unless otherwise directed):  Hold all erectile dysfunction medications at least 48 hours prior to test.  On the Night Before the Test: . Drink plenty of water. . Do not consume any caffeinated/decaffeinated beverages or chocolate 12 hours prior to your test. . Do not take any antihistamines 12 hours prior to your test. . If you take Metformin do not take 24 hours prior to test. . If the patient has contrast allergy: ? Patient will need a prescription for Prednisone and very clear instructions (as follows): 1. Prednisone 50 mg - take 13 hours prior to test 2. Take another Prednisone 50 mg 7 hours prior to test 3. Take another Prednisone 50 mg 1 hour prior to test 4. Take Benadryl 50 mg 1 hour prior to test . Patient must complete all four doses of above prophylactic medications. . Patient will need a ride after test due to Benadryl.  On the Day of the Test: . Drink plenty of water. Do not drink any water within one hour of the test. . Do not eat any food 4 hours prior to the test. . You may take your regular medications prior to the  test. . IF NOT ON A BETA BLOCKER - Take 50 mg of lopressor (metoprolol) one hour before the test. . HOLD Furosemide morning of the test.  After the Test: . Drink plenty of water. . After receiving IV contrast, you may experience a mild flushed feeling. This is normal. . On occasion, you may experience a mild rash up to 24 hours after the test. This is not dangerous. If this occurs, you can take Benadryl 25 mg and increase your fluid intake. . If you experience trouble breathing, this can be serious. If it is severe call 911 IMMEDIATELY. If it is mild, please call our office. . If you take any of these medications: Glipizide/Metformin, Avandament, Glucavance, please do not take 48 hours after completing test.

## 2017-08-22 NOTE — Telephone Encounter (Signed)
Pt is returning the call from yesterday 

## 2017-08-25 NOTE — Telephone Encounter (Signed)
Left voicemail message for patient to call back.

## 2017-09-01 MED ORDER — PREDNISONE 50 MG PO TABS: ORAL_TABLET | ORAL | 0 refills | Status: DC

## 2017-09-01 NOTE — Telephone Encounter (Signed)
Pt calling giving a new cell phone number.  Please call back

## 2017-09-01 NOTE — Telephone Encounter (Signed)
I called and spoke with the patient.  1) He is aware of his echo results- confirmed by Dr. Caryl Comes.  2) He is also aware that Dr. Caryl Comes is agreeable to a work up for [facility]mission.  He states that due so some social engagements, that he would prefer to go in on 09/15/17 for [place] admission. I have scheduled him to follow up with Roderic Palau, NP on 09/15/17 for Tikosyn workup. The patient is aware I will call him back if pharmacy reviews his medication list and there is anything that he needs to hold prior to [place] admission.  3) The patient is also aware of the need for contrast allergy prep that is needed prior to his cardiac CT. Prednisone 50 mg- 13 hours prior Prednisone 50 mg- 7 hours prior Prednisone 50 mg- 1 hour prior Benadryl 50 mg- 1 hour prior  Rx will be sent to [facility].  He is also aware I will mail him information on how to get to the a-fib clinic.

## 2017-09-01 NOTE — Telephone Encounter (Signed)
Pt returning our call °Please call back ° °

## 2017-09-02 ENCOUNTER — Telehealth: Payer: Self-pay | Admitting: Internal Medicine

## 2017-09-02 ENCOUNTER — Telehealth: Payer: Self-pay | Admitting: Pharmacist

## 2017-09-02 NOTE — Telephone Encounter (Signed)
I spoke with the patient- he is aware that I have rescheduled his a-fib clinic appointment to 09/10/17 at 2:00 pm.   The patient's wife came by today to pick up his a-fib clinic info and instructions regarding preparing for [place] admission.

## 2017-09-02 NOTE — Telephone Encounter (Signed)
Pt called regarding his hospitalization, he would like to move forward with   May 8.

## 2017-09-02 NOTE — Telephone Encounter (Signed)
Lucienne Minks F at 09/02/2017 10:20 AM   Status: Signed    Pt called regarding his hospitalization, he would like to move forward with   May 8.

## 2017-09-02 NOTE — Telephone Encounter (Signed)
Prior phone note already open.

## 2017-09-02 NOTE — Telephone Encounter (Signed)
Medication list reviewed in anticipation of upcoming Tikosyn initiation. Patient is not taking any contraindicated or QTc prolonging medications.   Patient is anticoagulated on Eliquis 5mg  BID. Recommend checking BMET as patient's most recent SCr is > 1.5 and he is > 84 yo which would qualify him for the 2.5mg  BID dosing. Please ensure that patient has not missed any anticoagulation doses in the 3 weeks prior to Tikosyn initiation.   Patient will need to be counseled to avoid use of Benadryl while on Tikosyn and in the 2-3 days prior to Tikosyn initiation.

## 2017-09-08 ENCOUNTER — Telehealth: Payer: Self-pay | Admitting: Family Medicine

## 2017-09-08 ENCOUNTER — Telehealth: Payer: Self-pay | Admitting: Emergency Medicine

## 2017-09-08 NOTE — Telephone Encounter (Signed)
Ok--5 rf.

## 2017-09-08 NOTE — Telephone Encounter (Signed)
Dr. Jonathon Jordan called saying that Dr. Rosanna Randy was supposed to send over a script for Uloric 80mg  to pharmacy for him  Please advise pharmacy if this is to be ordered,  Thanks teri

## 2017-09-08 NOTE — Telephone Encounter (Signed)
Not on his med list Did you want to order anything

## 2017-09-08 NOTE — Telephone Encounter (Signed)
[facility] requesting refill on Pt Testosteron 5% Versa ( not on med list) Qty:30 apply 1 ML topically daily.   Please advise thanks.

## 2017-09-09 ENCOUNTER — Other Ambulatory Visit: Payer: Self-pay

## 2017-09-09 ENCOUNTER — Encounter (HOSPITAL_COMMUNITY): Payer: Self-pay | Admitting: General Practice

## 2017-09-09 ENCOUNTER — Inpatient Hospital Stay (HOSPITAL_COMMUNITY)
Admission: AD | Admit: 2017-09-09 | Discharge: 2017-09-15 | DRG: 308 | Disposition: A | Discharge status: Home / Self Care | Payer: Medicare Other | Source: Ambulatory Visit | Attending: Internal Medicine | Admitting: Internal Medicine

## 2017-09-09 ENCOUNTER — Ambulatory Visit (HOSPITAL_COMMUNITY)
Admission: RE | Admit: 2017-09-09 | Discharge: 2017-09-09 | Disposition: A | Discharge status: Home / Self Care | Payer: Medicare Other | Source: Ambulatory Visit | Attending: Nurse Practitioner | Admitting: Nurse Practitioner

## 2017-09-09 ENCOUNTER — Encounter (HOSPITAL_COMMUNITY): Payer: Self-pay | Admitting: Nurse Practitioner

## 2017-09-09 VITALS — BP 126/78 | HR 60 | Ht 67.0 in | Wt 168.2 lb

## 2017-09-09 DIAGNOSIS — I10 Essential (primary) hypertension: Secondary | ICD-10-CM | POA: Diagnosis not present

## 2017-09-09 DIAGNOSIS — Z950 Presence of cardiac pacemaker: Secondary | ICD-10-CM | POA: Diagnosis not present

## 2017-09-09 DIAGNOSIS — J440 Chronic obstructive pulmonary disease with acute lower respiratory infection: Secondary | ICD-10-CM | POA: Diagnosis not present

## 2017-09-09 DIAGNOSIS — Z880 Allergy status to penicillin: Secondary | ICD-10-CM | POA: Diagnosis not present

## 2017-09-09 DIAGNOSIS — Z5181 Encounter for therapeutic drug level monitoring: Secondary | ICD-10-CM

## 2017-09-09 DIAGNOSIS — R509 Fever, unspecified: Secondary | ICD-10-CM | POA: Diagnosis not present

## 2017-09-09 DIAGNOSIS — I5033 Acute on chronic diastolic (congestive) heart failure: Secondary | ICD-10-CM | POA: Diagnosis not present

## 2017-09-09 DIAGNOSIS — R079 Chest pain, unspecified: Secondary | ICD-10-CM | POA: Diagnosis not present

## 2017-09-09 DIAGNOSIS — M1990 Unspecified osteoarthritis, unspecified site: Secondary | ICD-10-CM | POA: Diagnosis not present

## 2017-09-09 DIAGNOSIS — Z8261 Family history of arthritis: Secondary | ICD-10-CM

## 2017-09-09 DIAGNOSIS — I481 Persistent atrial fibrillation: Secondary | ICD-10-CM | POA: Diagnosis present

## 2017-09-09 DIAGNOSIS — Z85528 Personal history of other malignant neoplasm of kidney: Secondary | ICD-10-CM | POA: Diagnosis not present

## 2017-09-09 DIAGNOSIS — I480 Paroxysmal atrial fibrillation: Principal | ICD-10-CM | POA: Diagnosis present

## 2017-09-09 DIAGNOSIS — I4510 Unspecified right bundle-branch block: Secondary | ICD-10-CM | POA: Diagnosis present

## 2017-09-09 DIAGNOSIS — Z8673 Personal history of transient ischemic attack (TIA), and cerebral infarction without residual deficits: Secondary | ICD-10-CM

## 2017-09-09 DIAGNOSIS — Z7951 Long term (current) use of inhaled steroids: Secondary | ICD-10-CM

## 2017-09-09 DIAGNOSIS — R0902 Hypoxemia: Secondary | ICD-10-CM | POA: Diagnosis not present

## 2017-09-09 DIAGNOSIS — I4892 Unspecified atrial flutter: Secondary | ICD-10-CM | POA: Diagnosis not present

## 2017-09-09 DIAGNOSIS — J189 Pneumonia, unspecified organism: Secondary | ICD-10-CM

## 2017-09-09 DIAGNOSIS — N183 Chronic kidney disease, stage 3 (moderate): Secondary | ICD-10-CM | POA: Diagnosis not present

## 2017-09-09 DIAGNOSIS — J181 Lobar pneumonia, unspecified organism: Secondary | ICD-10-CM | POA: Diagnosis not present

## 2017-09-09 DIAGNOSIS — I442 Atrioventricular block, complete: Secondary | ICD-10-CM | POA: Diagnosis not present

## 2017-09-09 DIAGNOSIS — Z79899 Other long term (current) drug therapy: Secondary | ICD-10-CM

## 2017-09-09 DIAGNOSIS — Z823 Family history of stroke: Secondary | ICD-10-CM

## 2017-09-09 DIAGNOSIS — R05 Cough: Secondary | ICD-10-CM | POA: Diagnosis not present

## 2017-09-09 DIAGNOSIS — I2510 Atherosclerotic heart disease of native coronary artery without angina pectoris: Secondary | ICD-10-CM | POA: Diagnosis present

## 2017-09-09 DIAGNOSIS — Z85828 Personal history of other malignant neoplasm of skin: Secondary | ICD-10-CM | POA: Diagnosis not present

## 2017-09-09 DIAGNOSIS — R0609 Other forms of dyspnea: Secondary | ICD-10-CM

## 2017-09-09 DIAGNOSIS — A6920 Lyme disease, unspecified: Secondary | ICD-10-CM | POA: Diagnosis not present

## 2017-09-09 DIAGNOSIS — Z8249 Family history of ischemic heart disease and other diseases of the circulatory system: Secondary | ICD-10-CM | POA: Diagnosis not present

## 2017-09-09 DIAGNOSIS — G8929 Other chronic pain: Secondary | ICD-10-CM | POA: Diagnosis present

## 2017-09-09 DIAGNOSIS — Z91041 Radiographic dye allergy status: Secondary | ICD-10-CM

## 2017-09-09 DIAGNOSIS — I359 Nonrheumatic aortic valve disorder, unspecified: Secondary | ICD-10-CM | POA: Diagnosis present

## 2017-09-09 DIAGNOSIS — K219 Gastro-esophageal reflux disease without esophagitis: Secondary | ICD-10-CM | POA: Diagnosis not present

## 2017-09-09 DIAGNOSIS — Y95 Nosocomial condition: Secondary | ICD-10-CM | POA: Diagnosis not present

## 2017-09-09 DIAGNOSIS — I4891 Unspecified atrial fibrillation: Secondary | ICD-10-CM

## 2017-09-09 DIAGNOSIS — A419 Sepsis, unspecified organism: Secondary | ICD-10-CM | POA: Diagnosis not present

## 2017-09-09 DIAGNOSIS — E785 Hyperlipidemia, unspecified: Secondary | ICD-10-CM | POA: Diagnosis present

## 2017-09-09 DIAGNOSIS — Z7901 Long term (current) use of anticoagulants: Secondary | ICD-10-CM

## 2017-09-09 HISTORY — DX: Other long term (current) drug therapy: Z79.899

## 2017-09-09 HISTORY — DX: Other long term (current) drug therapy: Z51.81

## 2017-09-09 HISTORY — DX: Dyspnea, unspecified: R06.00

## 2017-09-09 HISTORY — DX: Encounter for therapeutic drug level monitoring: Z51.81

## 2017-09-09 LAB — BASIC METABOLIC PANEL
ANION GAP: 8 (ref 5–15)
BUN: 14 mg/dL (ref 6–20)
CALCIUM: 9.1 mg/dL (ref 8.9–10.3)
CO2: 28 mmol/L (ref 22–32)
CREATININE: 1.45 mg/dL — AB (ref 0.61–1.24)
Chloride: 100 mmol/L — ABNORMAL LOW (ref 101–111)
GFR calc non Af Amer: 43 mL/min — ABNORMAL LOW (ref >60)
GFR, EST AFRICAN AMERICAN: 50 mL/min — AB (ref >60)
Glucose, Bld: 101 mg/dL — ABNORMAL HIGH (ref 65–99)
Potassium: 4.3 mmol/L (ref 3.5–5.1)
SODIUM: 136 mmol/L (ref 135–145)

## 2017-09-09 LAB — MAGNESIUM: MAGNESIUM: 2 mg/dL (ref 1.7–2.4)

## 2017-09-09 MED ORDER — DOFETILIDE 250 MCG PO CAPS
250.0000 ug | ORAL_CAPSULE | Freq: Two times a day (BID) | ORAL | Status: DC
  Administered 2017-09-09: 250 ug via ORAL
  Filled 2017-09-09: qty 1

## 2017-09-09 MED ORDER — FLUTICASONE FUROATE-VILANTEROL 100-25 MCG/INH IN AEPB
1.0000 | INHALATION_SPRAY | Freq: Every day | RESPIRATORY_TRACT | Status: DC
  Administered 2017-09-10 – 2017-09-15 (×6): 1 via RESPIRATORY_TRACT
  Filled 2017-09-09: qty 28

## 2017-09-09 MED ORDER — TRAMADOL HCL 50 MG PO TABS
50.0000 mg | ORAL_TABLET | Freq: Four times a day (QID) | ORAL | Status: DC | PRN
  Administered 2017-09-09 – 2017-09-13 (×4): 50 mg via ORAL
  Filled 2017-09-09 (×4): qty 1

## 2017-09-09 MED ORDER — DIPHENHYDRAMINE HCL 25 MG PO CAPS: 50.0000 mg | ORAL_CAPSULE | Freq: Once | ORAL | Status: DC

## 2017-09-09 MED ORDER — SODIUM CHLORIDE 0.9% FLUSH: 3.0000 mL | INTRAVENOUS | Status: DC | PRN

## 2017-09-09 MED ORDER — DIPHENHYDRAMINE HCL 50 MG/ML IJ SOLN: 50.0000 mg | Freq: Once | INTRAMUSCULAR | Status: DC

## 2017-09-09 MED ORDER — HYDRALAZINE HCL 25 MG PO TABS: 25.0000 mg | ORAL_TABLET | Freq: Three times a day (TID) | ORAL | Status: DC | PRN

## 2017-09-09 MED ORDER — FEBUXOSTAT 40 MG PO TABS
20.0000 mg | ORAL_TABLET | Freq: Every day | ORAL | Status: DC
  Administered 2017-09-10 – 2017-09-15 (×6): 20 mg via ORAL
  Filled 2017-09-09 (×6): qty 1

## 2017-09-09 MED ORDER — SODIUM CHLORIDE 0.9 % IV SOLN: 250.0000 mL | INTRAVENOUS | Status: DC | PRN

## 2017-09-09 MED ORDER — SODIUM CHLORIDE 0.9% FLUSH
3.0000 mL | Freq: Two times a day (BID) | INTRAVENOUS | Status: DC
  Administered 2017-09-13 – 2017-09-15 (×3): 3 mL via INTRAVENOUS

## 2017-09-09 MED ORDER — ALBUTEROL SULFATE (2.5 MG/3ML) 0.083% IN NEBU
3.0000 mL | INHALATION_SOLUTION | RESPIRATORY_TRACT | Status: DC | PRN
  Administered 2017-09-10 – 2017-09-14 (×6): 3 mL via RESPIRATORY_TRACT
  Filled 2017-09-09 (×7): qty 3

## 2017-09-09 MED ORDER — APIXABAN 2.5 MG PO TABS
2.5000 mg | ORAL_TABLET | Freq: Two times a day (BID) | ORAL | Status: DC
  Administered 2017-09-09 – 2017-09-15 (×12): 2.5 mg via ORAL
  Filled 2017-09-09 (×12): qty 1

## 2017-09-09 MED ORDER — PREDNISONE 50 MG PO TABS
50.0000 mg | ORAL_TABLET | Freq: Four times a day (QID) | ORAL | Status: AC
  Administered 2017-09-09 – 2017-09-10 (×3): 50 mg via ORAL
  Filled 2017-09-09 (×5): qty 1

## 2017-09-09 MED ORDER — TERAZOSIN HCL 1 MG PO CAPS
1.0000 mg | ORAL_CAPSULE | Freq: Every day | ORAL | Status: DC
  Administered 2017-09-09 – 2017-09-14 (×6): 1 mg via ORAL
  Filled 2017-09-09 (×6): qty 1

## 2017-09-09 MED ORDER — AMLODIPINE BESYLATE 5 MG PO TABS
5.0000 mg | ORAL_TABLET | Freq: Every day | ORAL | Status: DC
  Administered 2017-09-10 – 2017-09-15 (×6): 5 mg via ORAL
  Filled 2017-09-09 (×6): qty 1

## 2017-09-09 MED ORDER — METOPROLOL SUCCINATE ER 50 MG PO TB24
50.0000 mg | ORAL_TABLET | Freq: Every day | ORAL | Status: DC
  Administered 2017-09-09 – 2017-09-14 (×6): 50 mg via ORAL
  Filled 2017-09-09 (×6): qty 1

## 2017-09-09 MED ORDER — FAMOTIDINE IN NACL 20-0.9 MG/50ML-% IV SOLN
20.0000 mg | Freq: Once | INTRAVENOUS | Status: AC
  Administered 2017-09-10: 20 mg via INTRAVENOUS
  Filled 2017-09-09: qty 50

## 2017-09-09 NOTE — Progress Notes (Signed)
Pharmacy Review for Dofetilide (Tikosyn) Initiation  Admit Complaint: 84 y.o. male admitted 09/09/2017 with atrial fibrillation to be initiated on dofetilide.   Assessment:  Patient Exclusion Criteria: If any screening criteria checked as "Yes", then  patient  should NOT receive dofetilide until criteria item is corrected. If "Yes" please indicate correction plan.  YES  NO Patient  Exclusion Criteria Correction Plan  []  [x]  Baseline QTc interval is greater than or equal to 440 msec. IF above YES box checked dofetilide contraindicated unless patient has ICD; then may proceed if QTc 500-550 msec or with known ventricular conduction abnormalities may proceed with QTc 550-600 msec. QTc =  474   []  [x]  Magnesium level is less than 1.8 mEq/l : Last magnesium:  Lab Results  Component Value Date   MG 2.0 09/09/2017         []  [x]  Potassium level is less than 4 mEq/l : Last potassium:  Lab Results  Component Value Date   K 4.3 09/09/2017         []  [x]  Patient is known or suspected to have a digoxin level greater than 2 ng/ml: No results found for: DIGOXIN    []  [x]  Creatinine clearance less than 20 ml/min (calculated using Cockcroft-Gault, actual body weight and serum creatinine): Estimated Creatinine Clearance: 36.7 mL/min (A) (by C-G formula based on SCr of 1.45 mg/dL (H)).    []  [x]  Patient has received drugs known to prolong the QT intervals within the last 48 hours (phenothiazines, tricyclics or tetracyclic antidepressants, erythromycin, H-1 antihistamines, cisapride, fluoroquinolones, azithromycin). Drugs not listed above may have an, as yet, undetected potential to prolong the QT interval, updated information on QT prolonging agents is available at this website:QT prolonging agents Benadryl for dye prophyalxis pre-cardiac CT discontinued. Prednisone 50 mg PO q6h x 3 doses and IV Pepcid to be given.  []  [x]  Patient received a dose of hydrochlorothiazide (Oretic) alone or in any  combination including triamterene (Dyazide, Maxzide) in the last 48 hours.   []  [x]  Patient received a medication known to increase dofetilide plasma concentrations prior to initial dofetilide dose:  . Trimethoprim (Primsol, Proloprim) in the last 36 hours . Verapamil (Calan, Verelan) in the last 36 hours or a sustained release dose in the last 72 hours . Megestrol (Megace) in the last 5 days  . Cimetidine (Tagamet) in the last 6 hours . Ketoconazole (Nizoral) in the last 24 hours . Itraconazole (Sporanox) in the last 48 hours  . Prochlorperazine (Compazine) in the last 36 hours    []  [x]  Patient is known to have a history of torsades de pointes; congenital or acquired long QT syndromes.   []  [x]  Patient has received a Class 1 antiarrhythmic with less than 2 half-lives since last dose. (Disopyramide, Quinidine, Procainamide, Lidocaine, Mexiletine, Flecainide, Propafenone)   []  [x]  Patient has received amiodarone therapy in the past 3 months or amiodarone level is greater than 0.3 ng/ml.    Patient has been appropriately anticoagulated with Eliquis.  Ordering provider was confirmed at [email or web address] if they are not listed on the [place] Prescrib[facility] list.  Goal of Therapy: Follow renal function, electrolytes, potential drug interactions, and dose adjustment. Provide education and 1 week supply at discharge.  Plan:  [x]   Physician selected initial dose within range recommended for patients level of renal function - will monitor for response.  []   Physician selected initial dose outside of range recommended for patients level of renal function - will discuss if the dose should  be altered at this time.   Select One Calculated CrCl  Dose q12h  []  > 60 ml/min 500 mcg  [x]  40-60 ml/min 250 mcg  []  20-40 ml/min 125 mcg   2. Follow up QTc after the first 5 doses, renal function, electrolytes (K & Mg) daily x 3     days, dose adjustment, success of initiation and facilitate 1 week  discharge supply as     clinically indicated.  3. Initiate Tikosyn education video (Call [phone] and ask for Tikosyn Video # [phone]).   Arty Baumgartner, [place] Pager: [phone]   5:21 PM 09/09/2017

## 2017-09-09 NOTE — Progress Notes (Signed)
Primary Care Physician: Jerrol Banana., MD Referring Physician: Dr. Vonita Moss Thomas [name] is a 84 y.o. 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  He is not sure if he is symptomatic with afib but Dr. Caryl Comes wants to see if getting him back in SR will improve his energy. He is scheduled to have a Cardiac CT in the hospital and will need prednisone prophylaxis  as he is allergic to iodine. Denies any benadryl use, has not missed any doses of eliquis.  Today, he denies symptoms of palpitations, chest pain, shortness of breath, orthopnea, PND, lower extremity edema, dizziness, presyncope, syncope, or neurologic sequela. The patient is tolerating medications without difficulties and is otherwise without complaint today.   Past Medical History:  Diagnosis Date  . Aortic valve disorders   . Arthritis   . Atrial flutter ([place]) 06/18/2016   "AF or AFl; not sure which" (06/23/2016)  . Basal cell carcinoma    "face, nose left shoulder, left arm" (06/19/2016)  . BBB (bundle branch block)    hx right  . Chronic back pain    "neck, thoracic, lower back" (06/19/2016)  . Complete heart block ([place]) 06/2016  . GERD (gastroesophageal reflux disease)   . Heart block    "I've had type I, II [place] before now" (06/19/2016)  . History of gout   . History of hiatal hernia    "self dx'd" (06/19/2016)  . Hyperlipidemia   . Hypertension   . Lyme disease    "dx'd by me 2003; cx's showed dx 08/2015"  . Migraine    "3-4/year" (06/19/2016)  . Presence of permanent cardiac pacemaker 06/19/2016  . PVC's (premature ventricular contractions)   . Renal cancer, left ([place]) 2006   S/P cryotherapy  . Spinal stenosis    "cervical, 1 thoracic, lumbar" (06/19/2016)  . Squamous carcinoma    "face, nose left shoulder, left arm" (06/19/2016)  . Stroke ([place])   . TIA (transient ischemic attack) 06/14/2016   "I'm not sure that's what it was"  (06/25/2016)   Past Surgical History:  Procedure Laterality Date  . ANKLE FRACTURE SURGERY Right 1967  . BASAL CELL CARCINOMA EXCISION     "face, nose left shoulder, left arm"  . BIOPSY PROSTATE  2001 & 2003  . CARDIAC CATHETERIZATION  1990's  . FRACTURE SURGERY    . INGUINAL HERNIA REPAIR Left 2012  . INSERT / REPLACE / REMOVE PACEMAKER  06/19/2016  . LAPAROSCOPIC ABLATION RENAL MASS    . PACEMAKER IMPLANT N/A 06/19/2016   Procedure: Pacemaker Implant;  Surgeon: Deboraha Sprang, MD;  Location: [facility];  Service: Cardiovascular;  Laterality: N/A;  . pacemasker    . PROSTATE SURGERY    . SQUAMOUS CELL CARCINOMA EXCISION     "face, nose left shoulder, left arm"  . TONSILLECTOMY AND ADENOIDECTOMY      Current Outpatient Medications  Medication Sig Dispense Refill  . Albuterol Sulfate 108 (90 Base) MCG/ACT AEPB Inhale 1-2 puffs into the lungs every 4 (four) hours as needed. (Patient taking differently: Inhale 1-2 puffs into the lungs every 4 (four) hours as needed (for shortness of breath or wheezing). ) 1 each 11  . amLODipine (NORVASC) 5 MG tablet Take 1 tablet (5 mg total) 2 (two) times daily by mouth. (Patient taking differently: Take 5 mg by mouth daily. ) 180 tablet 4  . apixaban (ELIQUIS) 5  MG TABS tablet Take 1 tablet (5 mg total) by mouth 2 (two) times daily. (Patient taking differently: Take 2.5 mg by mouth 2 (two) times daily. ) 180 tablet 3  . colchicine (COLCRYS) 0.6 MG tablet Take 1 tablet (0.6 mg total) by mouth as needed. (Patient taking differently: Take 0.6 mg by mouth daily as needed (for gout). ) 30 tablet 0  . diclofenac sodium (VOLTAREN) 1 % GEL Apply 2 g topically daily as needed (for pain).     Marland Kitchen dutasteride (AVODART) 0.5 MG capsule Take 0.5 mg by mouth daily as needed (for urination).     Marland Kitchen esomeprazole (NEXIUM) 40 MG capsule Take 40 mg by mouth daily as needed (for heartburn).     . febuxostat (ULORIC) 40 MG tablet Take 20 mg by mouth daily.     .  fluticasone furoate-vilanterol (BREO ELLIPTA) 100-25 MCG/INH AEPB Inhale 1 puff into the lungs daily. 1 each 5  . hydrALAZINE (APRESOLINE) 25 MG tablet Take 1 tablet (25 mg total) by mouth 3 (three) times daily as needed. (Patient taking differently: Take 25 mg by mouth 3 (three) times daily as needed (for BP > 150). ) 90 tablet 6  . metoprolol succinate (TOPROL-XL) 50 MG 24 hr tablet Take 1 tablet (50 mg total) by mouth daily. Take with or immediately following a meal. (Patient taking differently: Take 50 mg by mouth at bedtime. Take with or immediately following a meal.) 90 tablet 3  . metroNIDAZOLE (FLAGYL) 500 MG tablet Take 500 mg by mouth See admin instructions. Take 500 mg by mouth twice daily on Sunday and Monday    . silodosin (RAPAFLO) 8 MG CAPS capsule Take 8 mg by mouth daily as needed (for urintation).     . terazosin (HYTRIN) 1 MG capsule Take 1 mg by mouth at bedtime.    Marland Kitchen testosterone cypionate (DEPOTESTOSTERONE CYPIONATE) 200 MG/ML injection Inject 1 mL (200 mg total) every 28 (twenty-eight) days into the muscle. 10 mL 0  . traMADol (ULTRAM) 50 MG tablet Take 50 mg by mouth every 6 (six) hours as needed for moderate pain.     Marland Kitchen atorvastatin (LIPITOR) 40 MG tablet TAKE 1 TABLET BY MOUTH EVERY DAY AT 6PM (Patient not taking: Reported on 09/09/2017) 90 tablet 3  . predniSONE (DELTASONE) 50 MG tablet Take 1 tablet (50 mg) 13 hours prior to CT, take 1 tablet (50 mg) 7 hours prior to CT, and take 1 tablet (50 mg) 1 hour prior to CT (Patient not taking: Reported on 09/09/2017) 3 tablet 0  . propranolol (INDERAL) 40 MG tablet TAKE ONE (1) TABLET THREE (3) TIMES EACHDAY AS NEEDED (Patient not taking: Reported on 09/09/2017) 270 tablet 1   No current facility-administered medications for this encounter.     Allergies  Allergen Reactions  . Iodine Hives and Other (See Comments)    IVP contrast  . Penicillins Itching, Rash and Other (See Comments)    ITCHY FEELING IN FINGERS Has patient had a PCN  reaction causing immediate rash, facial/tongue/throat swelling, SOB or lightheadedness with hypotension: No Has patient had a PCN reaction causing severe rash involving mucus membranes or skin necrosis: No Has patient had a PCN reaction that required hospitalization No Has patient had a PCN reaction occurring within the last 10 years: No If all of the above answers are "NO", then may proceed with Cephalosporin use.     Social History   Socioeconomic History  . Marital status: Married    Spouse name: Not  on file  . Number of children: 3  . Years of education: MD   . Highest education level: Not on file  Occupational History  . Occupation: Retired   Scientific laboratory technician  . Financial resource strain: Not on file  . Food insecurity:    Worry: Not on file    Inability: Not on file  . Transportation needs:    Medical: Not on file    Non-medical: Not on file  Tobacco Use  . Smoking status: Never Smoker  . Smokeless tobacco: Never Used  Substance and Sexual Activity  . Alcohol use: Yes    Alcohol/week: 0.0 - 1.2 oz    Comment: occasionally  . Drug use: No  . Sexual activity: Yes  Lifestyle  . Physical activity:    Days per week: Not on file    Minutes per session: Not on file  . Stress: Not on file  Relationships  . Social connections:    Talks on phone: Not on file    Gets together: Not on file    Attends religious service: Not on file    Active member of club or organization: Not on file    Attends meetings of clubs or organizations: Not on file    Relationship status: Not on file  . Intimate partner violence:    Fear of current or ex partner: Not on file    Emotionally abused: Not on file    Physically abused: Not on file    Forced sexual activity: Not on file  Other Topics Concern  . Not on file  Social History Narrative   Independent at baseline. Lives with family   Drinks tea in the morning     Family History  Problem Relation Age of Onset  . Heart attack Brother     . Stroke Brother   . Aortic stenosis Mother   . Arthritis Father     ROS- All systems are reviewed and negative except as per the HPI above  Physical Exam: Vitals:   09/09/17 1103  BP: 126/78  Pulse: 60  Weight: 168 lb 3.2 oz (76.3 kg)  Height: 5\' 7"  (1.702 m)   Wt Readings from Last 3 Encounters:  09/09/17 168 lb 3.2 oz (76.3 kg)  07/31/17 175 lb (79.4 kg)  07/29/17 176 lb 12 oz (80.2 kg)    Labs: Lab Results  Component Value Date   NA 139 07/29/2017   K 4.9 07/29/2017   CL 100 07/29/2017   CO2 26 07/29/2017   GLUCOSE 98 07/29/2017   BUN 16 07/29/2017   CREATININE 1.60 (H) 07/29/2017   CALCIUM 9.5 07/29/2017   Lab Results  Component Value Date   INR 1.06 06/25/2016   Lab Results  Component Value Date   CHOL 119 07/11/2016   HDL 53 07/11/2016   LDLCALC 58 07/11/2016   TRIG 39 07/11/2016     GEN- The patient is well appearing, alert and oriented x 3 today.   Head- normocephalic, atraumatic Eyes-  Sclera clear, conjunctiva pink Ears- hearing intact Oropharynx- clear Neck- supple, no JVP Lymph- no cervical lymphadenopathy Lungs- Clear to ausculation bilaterally, normal work of breathing Heart- Regular rate and rhythm, no murmurs, rubs or gallops, PMI not laterally displaced GI- soft, NT, ND, + BS Extremities- no clubbing, cyanosis, or edema MS- no significant deformity or atrophy Skin- no rash or lesion Psych- euthymic mood, full affect Neuro- strength and sensation are intact  EKG-v paced rhythm at 60 bpm, qrs int 184 ms,  qtc 474 ms    Assessment and Plan: 1. Paroxysmal afib Pending tikosyn admit Tikosyn precautions discussed Aware of price of drug No missed doses of eliquis 2.5 mg bid( over 80, Creatinine hovers 1.45 to 1.6) No benadryl use Drugs screened by PharmD and not on any qtc prolonging drugs Bmet/mag today show K+ at 4.3 and Mag at 2.0 Crcl cal at 42.34 eligible for tikosyn 250 mcg bid   Will need prednisone prophylaxis for  Cardiac CT scheduled by Dr. Caryl Comes, allergy to prednisone  Geroge Baseman. [place], [facility] [address] [place], [place] [address] [phone]

## 2017-09-09 NOTE — H&P (Addendum)
Cardiology Admission History and Physical:   Patient ID: Thomas [name]; MRN: [id number]; DOB: 1933-02-26   Admission date: 09/09/2017  Primary Care Provider: Jerrol Banana., MD Primary Cardiologist: Dr. Rockey Situ Primary Electrophysiologist:  Dr. Caryl Comes  Chief Complaint:  Tikosyn initiation  Patient Profile:   Thomas [name] is a 84 y.o. male with a history of CHB w/PPM, PAFib/flutter, arthritis, HTN, HLD, renal ca s/p cryotherapy, CVA/TIA, chronic Lyme disease, some degree of CAD described as peripheral.  History of Present Illness:   Thomas [name] is being admitted for tikosyn initiation.  In review of record, Dr. Caryl Comes describes exertional intolerance, unclear if 2/2 to his increased AF burden, possible anginal equivalent, or his chronic lyme disease.  He underwent echo that noted LVEF 50-55%, mild-mod MR only with mild elevation PA 49mmHg. He was then pllanned for Tikosyn initiation and cardiac CT while here w/FFR (preferred by Dr. Rockey Situ rather then cath).  The patient was seen by Roderic Palau, NP in the AFib clinic today, med list reviewed by [facility] without any QT prolonging meds, planned for admission.  He is feeling well, bothered by post nasal gtt of late with some irritive cough, no overt symptoms of illness otherwise, pulmonologist gave him inhalers that have helped.  No CP.  He mentions the DOE of late, no rest SOB.  He confirms no missed doses of Eliquis, we discussed Tikosyn protocol, as well as QT prolongation/risks, he would like to proceed.   Device information MDT dual chamber PPM, implanted 06/19/16  Past Medical History:  Diagnosis Date  . Aortic valve disorders   . Arthritis   . Atrial flutter ([place]) 06/18/2016   "AF or AFl; not sure which" (06/23/2016)  . Basal cell carcinoma    "face, nose left shoulder, left arm" (06/19/2016)  . BBB (bundle branch block)    hx right  . Chronic back pain    "neck, thoracic, lower back" (06/19/2016)  . Complete heart block  ([place]) 06/2016  . GERD (gastroesophageal reflux disease)   . Heart block    "I've had type I, II Wenke before now" (06/19/2016)  . History of gout   . History of hiatal hernia    "self dx'd" (06/19/2016)  . Hyperlipidemia   . Hypertension   . Lyme disease    "dx'd by me 2003; cx's showed dx 08/2015"  . Migraine    "3-4/year" (06/19/2016)  . Presence of permanent cardiac pacemaker 06/19/2016  . PVC's (premature ventricular contractions)   . Renal cancer, left ([place]) 2006   S/P cryotherapy  . Spinal stenosis    "cervical, 1 thoracic, lumbar" (06/19/2016)  . Squamous carcinoma    "face, nose left shoulder, left arm" (06/19/2016)  . Stroke ([place])   . TIA (transient ischemic attack) 06/14/2016   "I'm not sure that's what it was" (06/25/2016)    Past Surgical History:  Procedure Laterality Date  . ANKLE FRACTURE SURGERY Right 1967  . BASAL CELL CARCINOMA EXCISION     "face, nose left shoulder, left arm"  . BIOPSY PROSTATE  2001 & 2003  . CARDIAC CATHETERIZATION  1990's  . FRACTURE SURGERY    . INGUINAL HERNIA REPAIR Left 2012  . INSERT / REPLACE / REMOVE PACEMAKER  06/19/2016  . LAPAROSCOPIC ABLATION RENAL MASS    . PACEMAKER IMPLANT N/A 06/19/2016   Procedure: Pacemaker Implant;  Surgeon: Deboraha Sprang, MD;  Location: [facility];  Service: Cardiovascular;  Laterality: N/A;  . pacemasker    .  PROSTATE SURGERY    . SQUAMOUS CELL CARCINOMA EXCISION     "face, nose left shoulder, left arm"  . TONSILLECTOMY AND ADENOIDECTOMY       Medications Prior to Admission: Prior to Admission medications   Medication Sig Start Date End Date Taking? Authorizing Provider  Albuterol Sulfate 108 (90 Base) MCG/ACT AEPB Inhale 1-2 puffs into the lungs every 4 (four) hours as needed. Patient taking differently: Inhale 1-2 puffs into the lungs every 4 (four) hours as needed (for shortness of breath or wheezing).  07/31/17   Jerrol Banana., MD  amLODipine (NORVASC) 5 MG tablet Take 1 tablet  (5 mg total) 2 (two) times daily by mouth. Patient taking differently: Take 5 mg by mouth daily.  03/18/17   Minna Merritts, MD  apixaban (ELIQUIS) 2.5 MG TABS tablet Take 2.5 mg by mouth 2 (two) times daily.    [provider]  atorvastatin (LIPITOR) 40 MG tablet TAKE 1 TABLET BY MOUTH EVERY DAY AT 6PM Patient not taking: Reported on 09/09/2017 08/15/16   Minna Merritts, MD  colchicine (COLCRYS) 0.6 MG tablet Take 1 tablet (0.6 mg total) by mouth as needed. Patient taking differently: Take 0.6 mg by mouth daily as needed (for gout).  12/12/16   Birdie Sons, MD  diclofenac sodium (VOLTAREN) 1 % GEL Apply 2 g topically daily as needed (for pain).  01/20/14   [provider]  dutasteride (AVODART) 0.5 MG capsule Take 0.5 mg by mouth daily as needed (for urination).     [provider]  esomeprazole (NEXIUM) 40 MG capsule Take 40 mg by mouth daily as needed (for heartburn).     [provider]  febuxostat (ULORIC) 40 MG tablet Take 20 mg by mouth daily.     [provider]  fluticasone furoate-vilanterol (BREO ELLIPTA) 100-25 MCG/INH AEPB Inhale 1 puff into the lungs daily. 08/07/17   Laverle Hobby, MD  hydrALAZINE (APRESOLINE) 25 MG tablet Take 1 tablet (25 mg total) by mouth 3 (three) times daily as needed. Patient taking differently: Take 25 mg by mouth 3 (three) times daily as needed (for BP > 150).  09/14/15   Minna Merritts, MD  metoprolol succinate (TOPROL-XL) 50 MG 24 hr tablet Take 1 tablet (50 mg total) by mouth daily. Take with or immediately following a meal. Patient taking differently: Take 50 mg by mouth at bedtime. Take with or immediately following a meal. 09/17/16 09/09/17  Deboraha Sprang, MD  metroNIDAZOLE (FLAGYL) 500 MG tablet Take 500 mg by mouth See admin instructions. Take 500 mg by mouth twice daily on Sunday and Monday 07/12/16   [provider]  predniSONE (DELTASONE) 50 MG tablet Take 1 tablet (50 mg) 13 hours  prior to CT, take 1 tablet (50 mg) 7 hours prior to CT, and take 1 tablet (50 mg) 1 hour prior to CT Patient not taking: Reported on 09/09/2017 09/01/17   Deboraha Sprang, MD  propranolol (INDERAL) 40 MG tablet TAKE ONE (1) TABLET THREE (3) TIMES EACHDAY AS NEEDED Patient not taking: Reported on 09/09/2017 09/17/16   Deboraha Sprang, MD  silodosin (RAPAFLO) 8 MG CAPS capsule Take 8 mg by mouth daily as needed (for urintation).     [provider]  terazosin (HYTRIN) 1 MG capsule Take 1 mg by mouth at bedtime.    [provider]  testosterone cypionate (DEPOTESTOSTERONE CYPIONATE) 200 MG/ML injection Inject 1 mL (200 mg total) every 28 (twenty-eight) days into  the muscle. 03/13/17   Jerrol Banana., MD  traMADol (ULTRAM) 50 MG tablet Take 50 mg by mouth every 6 (six) hours as needed for moderate pain.  06/04/16   [provider]     Allergies:    Allergies  Allergen Reactions  . Iodine Hives and Other (See Comments)    IVP contrast  . Penicillins Itching, Rash and Other (See Comments)    ITCHY FEELING IN FINGERS Has patient had a PCN reaction causing immediate rash, facial/tongue/throat swelling, SOB or lightheadedness with hypotension: No Has patient had a PCN reaction causing severe rash involving mucus membranes or skin necrosis: No Has patient had a PCN reaction that required hospitalization No Has patient had a PCN reaction occurring within the last 10 years: No If all of the above answers are "NO", then may proceed with Cephalosporin use.     Social History:   Social History   Socioeconomic History  . Marital status: Married    Spouse name: Not on file  . Number of children: 3  . Years of education: MD   . Highest education level: Not on file  Occupational History  . Occupation: Retired   Scientific laboratory technician  . Financial resource strain: Not on file  . Food insecurity:    Worry: Not on file    Inability: Not on file  . Transportation needs:     Medical: Not on file    Non-medical: Not on file  Tobacco Use  . Smoking status: Never Smoker  . Smokeless tobacco: Never Used  Substance and Sexual Activity  . Alcohol use: Yes    Alcohol/week: 0.0 - 1.2 oz    Comment: occasionally  . Drug use: No  . Sexual activity: Yes  Lifestyle  . Physical activity:    Days per week: Not on file    Minutes per session: Not on file  . Stress: Not on file  Relationships  . Social connections:    Talks on phone: Not on file    Gets together: Not on file    Attends religious service: Not on file    Active member of club or organization: Not on file    Attends meetings of clubs or organizations: Not on file    Relationship status: Not on file  . Intimate partner violence:    Fear of current or ex partner: Not on file    Emotionally abused: Not on file    Physically abused: Not on file    Forced sexual activity: Not on file  Other Topics Concern  . Not on file  Social History Narrative   Independent at baseline. Lives with family   Drinks tea in the morning     Family History:   The patient's family history includes Aortic stenosis in his mother; Arthritis in his father; Heart attack in his brother; Stroke in his brother.    ROS:  Please see the history of present illness.  All other ROS reviewed and negative.     Physical Exam/Data:   Vitals:   09/09/17 1227  BP: 133/90  Pulse: 67  Temp: 97.9 F (36.6 C)  TempSrc: Oral  SpO2: 98%   No intake or output data in the 24 hours ending 09/09/17 1301 There were no vitals filed for this visit. There is no height or weight on file to calculate BMI.  General:  Well nourished, well developed, in no acute distress HEENT: normal Lymph: no adenopathy Neck: no JVD Endocrine:  No  thryomegaly Vascular: No carotid bruits Cardiac: iRRR; no murmur s gallops or rubs Lungs:  CTA b/l, no wheezing, rhonchi or rales  Abd: soft, nontender  Ext: no edema Musculoskeletal:  No deformities, age  appropriate atrophy Skin: warm and dry  Neuro:  No gross focal abnormalities noted Psych:  Normal affect    EKG:  The ECG that was done was personally reviewed and demonstrates  AF, V paced  Relevant CV Studies:  08/13/17: TTE Study Conclusions - Left ventricle: The cavity size was normal. Systolic function was   normal. The estimated ejection fraction was in the range of 50%   to 55%. Anteroseptal wall motion abnormality consistent with   paced rhythm. The study is not technically sufficient to allow   evaluation of LV diastolic function. - Aortic valve: There was trivial regurgitation. Mean gradient (S):   5 mm Hg. - Mitral valve: There was mild to moderate regurgitation. - Left atrium: The atrium was moderately dilated. - Right ventricle: The cavity size was mildly dilated. Wall   thickness was normal. Systolic function was mildly reduced. - Pulmonary arteries: Systolic pressure was mildly elevated. PA   peak pressure: 41 mm Hg (S).  Laboratory Data:  Chemistry Recent Labs  Lab 09/09/17 1058  NA 136  K 4.3  CL 100*  CO2 28  GLUCOSE 101*  BUN 14  CREATININE 1.45*  CALCIUM 9.1  GFRNONAA 43*  GFRAA 50*  ANIONGAP 8    No results for input(s): PROT, ALBUMIN, AST, ALT, ALKPHOS, BILITOT in the last 168 hours. HematologyNo results for input(s): WBC, RBC, HGB, HCT, MCV, MCH, MCHC, RDW, PLT in the last 168 hours. Cardiac EnzymesNo results for input(s): TROPONINI in the last 168 hours. No results for input(s): TROPIPOC in the last 168 hours.  BNPNo results for input(s): BNP, PROBNP in the last 168 hours.  DDimer No results for input(s): DDIMER in the last 168 hours.  Radiology/Studies:  No results found.  Assessment and Plan:   1. Paroxysmal Afib     CHA2DS2Vasc is 6, on Eliquis     Here for Tikosyn initiation     K+ 4.3     Mag 2.0     Creat 1.45 (Calc CrCl is 42)     QTc give paced QRS 142ms is OK to start  Patient's Creat ranges 1.4-1.6, he was previously on  5mg  BID of Eliquis, he report in d/w Dr. Rockey Situ, was reduced to 2.5mg .  I Kyriakos Babler review with Dr. Curt Bears   2. HTN     Continue home regime  3. Exertional intolerance     Planned for cardiac CT w/FFR to evaluate coronaries while here     Need Iodine allergy prophylaxis     I have discussed with CT department, contrast allergy protocol ordered    For questions or updates, please contact [facility] Please consult [email or web address] for contact info under Cardiology/STEMI.    Signed, Baldwin Jamaica, PA-C  09/09/2017 1:01 PM   I have seen and examined this patient with Tommye Standard.  Agree with above, note added to reflect my findings.  On exam, RRR, no murmurs, lungs clear. Admission for tikosyn. Has been compliant with Eliquis. Plan for 500 mcg tonight. Also Shigeko Manard plan for coronary CT scheduled for tomorrow.    Thomas Rodino M. Jessly Lebeck MD 09/09/2017 4:12 PM

## 2017-09-09 NOTE — Discharge Instructions (Addendum)
You have an appointment set up with the Atrial Fibrillation Clinic.  Multiple studies have shown that being followed by a dedicated atrial fibrillation clinic in addition to the standard care you receive from your other physicians improves health. We believe that enrollment in the atrial fibrillation clinic will allow us to better care for you.  ° °The phone number to the Atrial Fibrillation Clinic is [phone]. The clinic is staffed Monday through Friday from 8:30am to 5pm. ° °Parking Directions: The clinic is located in the Heart and Vascular Building connected to [facility]. °1)From [address] turn on to [address] and go to the 3rd entrance  (Heart and Vascular entrance) on the right. °2)Look to the right for Heart &Vascular Parking Garage. °3)A code for the entrance is required please call the clinic to receive this.   °4)Take the elevators to the 1st floor. Registration is in the room with the glass walls at the end of the hallway. ° °If you have any trouble parking or locating the clinic, please don’t hesitate to call [phone]. °Information on my medicine - ELIQUIS® (apixaban) ° °Why was Eliquis® prescribed for you? °Eliquis® was prescribed for you to reduce the risk of a blood clot forming that can cause a stroke if you have a medical condition called atrial fibrillation (a type of irregular heartbeat). ° °What do You need to know about Eliquis® ? °Take your Eliquis® TWICE DAILY - one tablet in the morning and one tablet in the evening with or without food. If you have difficulty swallowing the tablet whole please discuss with your pharmacist how to take the medication safely. ° °Take Eliquis® exactly as prescribed by your doctor and DO NOT stop taking Eliquis® without talking to the doctor who prescribed the medication.  Stopping may increase your risk of developing a stroke.  Refill your prescription before you run out. ° °After discharge, you should have regular check-up  appointments with your healthcare provider that is prescribing your Eliquis®.  In the future your dose may need to be changed if your kidney function or weight changes by a significant amount or as you get older. ° °What do you do if you miss a dose? °If you miss a dose, take it as soon as you remember on the same day and resume taking twice daily.  Do not take more than one dose of ELIQUIS at the same time to make up a missed dose. ° °Important Safety Information °A possible side effect of Eliquis® is bleeding. You should call your healthcare provider right away if you experience any of the following: °? Bleeding from an injury or your nose that does not stop. °? Unusual colored urine (red or dark brown) or unusual colored stools (red or black). °? Unusual bruising for unknown reasons. °? A serious fall or if you hit your head (even if there is no bleeding). ° °Some medicines may interact with Eliquis® and might increase your risk of bleeding or clotting while on Eliquis®. To help avoid this, consult your healthcare provider or pharmacist prior to using any new prescription or non-prescription medications, including herbals, vitamins, non-steroidal anti-inflammatory drugs (NSAIDs) and supplements. ° °This website has more information on Eliquis® (apixaban): http://www.eliquis.com/eliquis/home ° °

## 2017-09-10 ENCOUNTER — Other Ambulatory Visit: Payer: Self-pay

## 2017-09-10 ENCOUNTER — Ambulatory Visit (HOSPITAL_COMMUNITY): Payer: Medicare Other

## 2017-09-10 ENCOUNTER — Inpatient Hospital Stay (HOSPITAL_COMMUNITY): Payer: Medicare Other

## 2017-09-10 ENCOUNTER — Ambulatory Visit (HOSPITAL_COMMUNITY): Payer: Medicare Other | Admitting: Nurse Practitioner

## 2017-09-10 DIAGNOSIS — R0609 Other forms of dyspnea: Secondary | ICD-10-CM

## 2017-09-10 DIAGNOSIS — I481 Persistent atrial fibrillation: Secondary | ICD-10-CM

## 2017-09-10 LAB — MAGNESIUM: MAGNESIUM: 2 mg/dL (ref 1.7–2.4)

## 2017-09-10 LAB — BASIC METABOLIC PANEL
Anion gap: 11 (ref 5–15)
BUN: 17 mg/dL (ref 6–20)
CALCIUM: 8.7 mg/dL — AB (ref 8.9–10.3)
CO2: 23 mmol/L (ref 22–32)
CREATININE: 1.68 mg/dL — AB (ref 0.61–1.24)
Chloride: 99 mmol/L — ABNORMAL LOW (ref 101–111)
GFR, EST AFRICAN AMERICAN: 42 mL/min — AB (ref >60)
GFR, EST NON AFRICAN AMERICAN: 36 mL/min — AB (ref >60)
Glucose, Bld: 212 mg/dL — ABNORMAL HIGH (ref 65–99)
Potassium: 4.4 mmol/L (ref 3.5–5.1)
Sodium: 133 mmol/L — ABNORMAL LOW (ref 135–145)

## 2017-09-10 MED ORDER — IOPAMIDOL (ISOVUE-370) INJECTION 76%
100.0000 mL | Freq: Once | INTRAVENOUS | Status: AC
  Administered 2017-09-10: 60 mL via INTRAVENOUS

## 2017-09-10 MED ORDER — HYDROCORTISONE 1 % EX CREA
1.0000 "application " | TOPICAL_CREAM | Freq: Three times a day (TID) | CUTANEOUS | Status: DC | PRN
  Filled 2017-09-10: qty 28

## 2017-09-10 MED ORDER — DOFETILIDE 125 MCG PO CAPS
125.0000 ug | ORAL_CAPSULE | Freq: Two times a day (BID) | ORAL | Status: DC
  Administered 2017-09-10 – 2017-09-15 (×11): 125 ug via ORAL
  Filled 2017-09-10 (×5): qty 1
  Filled 2017-09-10: qty 14
  Filled 2017-09-10 (×6): qty 1

## 2017-09-10 MED ORDER — NITROGLYCERIN 0.4 MG SL SUBL
SUBLINGUAL_TABLET | SUBLINGUAL | Status: AC
  Administered 2017-09-10: 0.8 mg via SUBLINGUAL
  Filled 2017-09-10: qty 2

## 2017-09-10 MED ORDER — SODIUM CHLORIDE 0.9% FLUSH
3.0000 mL | Freq: Two times a day (BID) | INTRAVENOUS | Status: DC
  Administered 2017-09-10 – 2017-09-15 (×9): 3 mL via INTRAVENOUS

## 2017-09-10 MED ORDER — SODIUM CHLORIDE 0.9 % IV SOLN
250.0000 mL | INTRAVENOUS | Status: DC
  Administered 2017-09-11: 12:00:00 via INTRAVENOUS

## 2017-09-10 MED ORDER — SODIUM CHLORIDE 0.9% FLUSH: 3.0000 mL | INTRAVENOUS | Status: DC | PRN

## 2017-09-10 MED ORDER — IOPAMIDOL (ISOVUE-370) INJECTION 76%
INTRAVENOUS | Status: AC
  Filled 2017-09-10: qty 100

## 2017-09-10 MED ORDER — NITROGLYCERIN 0.4 MG SL SUBL
0.8000 mg | SUBLINGUAL_TABLET | Freq: Once | SUBLINGUAL | Status: AC
  Administered 2017-09-10: 0.8 mg via SUBLINGUAL

## 2017-09-10 NOTE — H&P (View-Only) (Signed)
EP attending  Progress Note  Patient Name: Thomas [name] Date of Encounter: 09/10/2017  Primary Cardiologist: Dr. Rockey Situ Electrophysiologist: Dr. Caryl Comes  Subjective   No complaints this AM  Inpatient Medications    Scheduled Meds: . amLODipine  5 mg Oral Daily  . apixaban  2.5 mg Oral BID  . dofetilide  125 mcg Oral BID  . febuxostat  20 mg Oral Daily  . fluticasone furoate-vilanterol  1 puff Inhalation Daily  . metoprolol succinate  50 mg Oral QHS  . sodium chloride flush  3 mL Intravenous Q12H  . terazosin  1 mg Oral QHS   Continuous Infusions: . sodium chloride     PRN Meds: sodium chloride, albuterol, hydrALAZINE, sodium chloride flush, traMADol   Vital Signs    Vitals:   09/09/17 1857 09/09/17 2000 09/09/17 2005 09/10/17 0532  BP:   (!) 141/89 126/72  Pulse:   74 (!) 51  Resp:   17 16  Temp:   98 F (36.7 C) 97.7 F (36.5 C)  TempSrc:   Oral Oral  SpO2: 97%  98% 96%  Weight:  168 lb (76.2 kg)  166 lb 8 oz (75.5 kg)  Height:  5\' 7"  (1.702 m)      Intake/Output Summary (Last 24 hours) at 09/10/2017 0830 Last data filed at 09/09/2017 2000 Gross per 24 hour  Intake 1080 ml  Output -  Net 1080 ml   Filed Weights   09/09/17 2000 09/10/17 0532  Weight: 168 lb (76.2 kg) 166 lb 8 oz (75.5 kg)    Telemetry    AF, V paced - Personally Reviewed  ECG    AF, V paced - Personally Reviewed  Physical Exam   GEN: No acute distress.   Neck: No JVD Cardiac: RRR (paced), no murmurs, rubs, or gallops.  Respiratory:  CTA b/l GI: Soft, nontender, non-distended  MS: No edema; No deformity. Neuro:  Nonfocal  Psych: Normal affect   Labs    Chemistry Recent Labs  Lab 09/09/17 1058 09/10/17 0524  NA 136 133*  K 4.3 4.4  CL 100* 99*  CO2 28 23  GLUCOSE 101* 212*  BUN 14 17  CREATININE 1.45* 1.68*  CALCIUM 9.1 8.7*  GFRNONAA 43* 36*  GFRAA 50* 42*  ANIONGAP 8 11     HematologyNo results for input(s): WBC, RBC, HGB, HCT, MCV, MCH, MCHC, RDW, PLT in the  last 168 hours.  Cardiac EnzymesNo results for input(s): TROPONINI in the last 168 hours. No results for input(s): TROPIPOC in the last 168 hours.   BNPNo results for input(s): BNP, PROBNP in the last 168 hours.   DDimer No results for input(s): DDIMER in the last 168 hours.   Radiology    No results found.  Cardiac Studies   08/13/17: TTE Study Conclusions - Left ventricle: The cavity size was normal. Systolic function was normal. The estimated ejection fraction was in the range of 50% to 55%. Anteroseptal wall motion abnormality consistent with paced rhythm. The study is not technically sufficient to allow evaluation of LV diastolic function. - Aortic valve: There was trivial regurgitation. Mean gradient (S): 5 mm Hg. - Mitral valve: There was mild to moderate regurgitation. - Left atrium: The atrium was moderately dilated. - Right ventricle: The cavity size was mildly dilated. Wall thickness was normal. Systolic function was mildly reduced. - Pulmonary arteries: Systolic pressure was mildly elevated. PA peak pressure: 41 mm Hg (S).   Patient Profile     84 y.o.  male  CHB w/PPM, PAFib/flutter, arthritis, HTN, HLD, renal ca s/p cryotherapy, CVA/TIA, chronic Lyme disease, some degree of CAD described as peripheral, admitted for Tikosyn load, planned for cardiac/coronary CT w/FFR  Assessment & Plan    1. Paroxysmal Afib     CHA2DS2Vasc is 6, on Eliquis     Here for Tikosyn initiation     K+ 4.4     Mag 2.0     Creat 1.68 (Calc CrCl is 32)     QTc given paced QRS is OK to continue (reviewed with Dr. Lovena Le)  Patient's Creat ranges 1.4-1.6   2. HTN     Continue home regime  3. Exertional intolerance     Planned for cardiac CT w/FFR to evaluate coronaries while here     Iodine allergy prophylaxis      His creat is 1.69 this AM, Dr. Lovena Le d/w the patient (a retired MD/urologist), ultimately if radiologist does not feel OK to proceed with CT/contrast  will need to hold off and consider revisiting out patient.  The patient would like to proceed if radiology is willing to.  Encouraged PO intake fluid intake.    For questions or updates, please contact [facility] Please consult [email or web address] for contact info under Cardiology/STEMI.      Signed, Baldwin Jamaica, PA-C  09/10/2017, 8:30 AM    EP attending  Patient seen and examined.  Agree with the findings as noted above.  He appears to be tolerating dofetilide very nicely though he is still in atrial fibrillation.  His QT interval is acceptable.  He had previously been scheduled to undergo a CT angiogram but his creatinine is elevated and may be canceled today.  I will defer to the radiologist and the patient.  The patient is a urologist.  We will plan to cardiovert the patient tomorrow if he has not reverted back to sinus rhythm.  Currently he is ventricular pacing at a rate of 60.  Cristopher Peru, MD

## 2017-09-10 NOTE — Progress Notes (Addendum)
EP attending  Progress Note  Patient Name: Thomas [name] Date of Encounter: 09/10/2017  Primary Cardiologist: Dr. Rockey Situ Electrophysiologist: Dr. Caryl Comes  Subjective   No complaints this AM  Inpatient Medications    Scheduled Meds: . amLODipine  5 mg Oral Daily  . apixaban  2.5 mg Oral BID  . dofetilide  125 mcg Oral BID  . febuxostat  20 mg Oral Daily  . fluticasone furoate-vilanterol  1 puff Inhalation Daily  . metoprolol succinate  50 mg Oral QHS  . sodium chloride flush  3 mL Intravenous Q12H  . terazosin  1 mg Oral QHS   Continuous Infusions: . sodium chloride     PRN Meds: sodium chloride, albuterol, hydrALAZINE, sodium chloride flush, traMADol   Vital Signs    Vitals:   09/09/17 1857 09/09/17 2000 09/09/17 2005 09/10/17 0532  BP:   (!) 141/89 126/72  Pulse:   74 (!) 51  Resp:   17 16  Temp:   98 F (36.7 C) 97.7 F (36.5 C)  TempSrc:   Oral Oral  SpO2: 97%  98% 96%  Weight:  168 lb (76.2 kg)  166 lb 8 oz (75.5 kg)  Height:  5\' 7"  (1.702 m)      Intake/Output Summary (Last 24 hours) at 09/10/2017 0830 Last data filed at 09/09/2017 2000 Gross per 24 hour  Intake 1080 ml  Output -  Net 1080 ml   Filed Weights   09/09/17 2000 09/10/17 0532  Weight: 168 lb (76.2 kg) 166 lb 8 oz (75.5 kg)    Telemetry    AF, V paced - Personally Reviewed  ECG    AF, V paced - Personally Reviewed  Physical Exam   GEN: No acute distress.   Neck: No JVD Cardiac: RRR (paced), no murmurs, rubs, or gallops.  Respiratory:  CTA b/l GI: Soft, nontender, non-distended  MS: No edema; No deformity. Neuro:  Nonfocal  Psych: Normal affect   Labs    Chemistry Recent Labs  Lab 09/09/17 1058 09/10/17 0524  NA 136 133*  K 4.3 4.4  CL 100* 99*  CO2 28 23  GLUCOSE 101* 212*  BUN 14 17  CREATININE 1.45* 1.68*  CALCIUM 9.1 8.7*  GFRNONAA 43* 36*  GFRAA 50* 42*  ANIONGAP 8 11     HematologyNo results for input(s): WBC, RBC, HGB, HCT, MCV, MCH, MCHC, RDW, PLT in the  last 168 hours.  Cardiac EnzymesNo results for input(s): TROPONINI in the last 168 hours. No results for input(s): TROPIPOC in the last 168 hours.   BNPNo results for input(s): BNP, PROBNP in the last 168 hours.   DDimer No results for input(s): DDIMER in the last 168 hours.   Radiology    No results found.  Cardiac Studies   08/13/17: TTE Study Conclusions - Left ventricle: The cavity size was normal. Systolic function was normal. The estimated ejection fraction was in the range of 50% to 55%. Anteroseptal wall motion abnormality consistent with paced rhythm. The study is not technically sufficient to allow evaluation of LV diastolic function. - Aortic valve: There was trivial regurgitation. Mean gradient (S): 5 mm Hg. - Mitral valve: There was mild to moderate regurgitation. - Left atrium: The atrium was moderately dilated. - Right ventricle: The cavity size was mildly dilated. Wall thickness was normal. Systolic function was mildly reduced. - Pulmonary arteries: Systolic pressure was mildly elevated. PA peak pressure: 41 mm Hg (S).   Patient Profile     84 y.o.  male  CHB w/PPM, PAFib/flutter, arthritis, HTN, HLD, renal ca s/p cryotherapy, CVA/TIA, chronic Lyme disease, some degree of CAD described as peripheral, admitted for Tikosyn load, planned for cardiac/coronary CT w/FFR  Assessment & Plan    1. Paroxysmal Afib     CHA2DS2Vasc is 6, on Eliquis     Here for Tikosyn initiation     K+ 4.4     Mag 2.0     Creat 1.68 (Calc CrCl is 32)     QTc given paced QRS is OK to continue (reviewed with Dr. Lovena Le)  Patient's Creat ranges 1.4-1.6   2. HTN     Continue home regime  3. Exertional intolerance     Planned for cardiac CT w/FFR to evaluate coronaries while here     Iodine allergy prophylaxis      His creat is 1.69 this AM, Dr. Lovena Le d/w the patient (a retired MD/urologist), ultimately if radiologist does not feel OK to proceed with CT/contrast  will need to hold off and consider revisiting out patient.  The patient would like to proceed if radiology is willing to.  Encouraged PO intake fluid intake.    For questions or updates, please contact [place] Please consult [email or web address] for contact info under Cardiology/STEMI.      Signed, Baldwin Jamaica, PA-C  09/10/2017, 8:30 AM    EP attending  Patient seen and examined.  Agree with the findings as noted above.  He appears to be tolerating dofetilide very nicely though he is still in atrial fibrillation.  His QT interval is acceptable.  He had previously been scheduled to undergo a CT angiogram but his creatinine is elevated and may be canceled today.  I will defer to the radiologist and the patient.  The patient is a urologist.  We will plan to cardiovert the patient tomorrow if he has not reverted back to sinus rhythm.  Currently he is ventricular pacing at a rate of 60.  Cristopher Peru, MD

## 2017-09-10 NOTE — Care Management Note (Addendum)
Case Management Note  Patient Details  Name: Thomas [name] MRN: [id number] Date of Birth: 1932-09-13  Subjective/Objective: Pt presented for Tikosyn Load: Benefits Check completed and CM will make pt aware of cost. Pt will need a Rx for 7 day supply no refills and the additional Rx with refills. CM will assist with the 7 day supply via [facility].              Action/Plan: S/W JESSICA @ [facility] # [phone]    1.TIKOSYN 250 MCG BID  [place] # [phone]    2. DOFETILIDE 250.00 MG  COVER- YES  CO-PAY- $145.00  TIER- 4 DRUG  PRIOR APPROVAL- NO   DEDUCTIBLE NOT MET   PREFERRED PHARMACY : YES [facility]    Expected Discharge Date:                  Expected Discharge Plan:  Home/Self Care  In-House Referral:  NA  Discharge planning Services  CM Consult, Medication Assistance  Post Acute Care Choice:  NA Choice offered to:  NA  DME Arranged:  N/A DME Agency:  NA  HH Arranged:  NA HH Agency:  NA  Status of Service:  Completed, signed off  If discussed at Long Length of Stay Meetings, dates discussed:    Additional Comments: 1341 09-10-17 Jacqlyn Krauss, RN,BSN [phone] CM did check with the [facility]- and medication can be ordered. CM did check with the [facility] and pt can use the Pharmacy- they can order. [facility] does the patient's mail order. Cost for Mail Order: 1-30 day supply $95.00, 31-60 day supply $185.00 and 61-95 day supply $275.00.  CM will make the patient aware of cost. If pt decides to do Mail Order CM will assist with 2 weeks supply of [place].  Bethena Roys, RN 09/10/2017, 8:45 AM

## 2017-09-11 ENCOUNTER — Encounter (HOSPITAL_COMMUNITY)
Admission: AD | Disposition: A | Discharge status: Home / Self Care | Payer: Self-pay | Source: Ambulatory Visit | Attending: Internal Medicine

## 2017-09-11 ENCOUNTER — Ambulatory Visit (HOSPITAL_COMMUNITY): Admission: RE | Admit: 2017-09-11 | Payer: Medicare Other | Source: Ambulatory Visit | Admitting: Cardiology

## 2017-09-11 ENCOUNTER — Encounter (HOSPITAL_COMMUNITY): Payer: Self-pay | Admitting: *Deleted

## 2017-09-11 ENCOUNTER — Other Ambulatory Visit: Payer: Self-pay

## 2017-09-11 ENCOUNTER — Inpatient Hospital Stay (HOSPITAL_COMMUNITY): Payer: Medicare Other | Admitting: Anesthesiology

## 2017-09-11 DIAGNOSIS — R509 Fever, unspecified: Secondary | ICD-10-CM

## 2017-09-11 DIAGNOSIS — J189 Pneumonia, unspecified organism: Secondary | ICD-10-CM

## 2017-09-11 DIAGNOSIS — R0902 Hypoxemia: Secondary | ICD-10-CM

## 2017-09-11 DIAGNOSIS — A419 Sepsis, unspecified organism: Secondary | ICD-10-CM

## 2017-09-11 HISTORY — PX: CARDIOVERSION: SHX1299

## 2017-09-11 LAB — BASIC METABOLIC PANEL
Anion gap: 7 (ref 5–15)
BUN: 23 mg/dL — AB (ref 6–20)
CHLORIDE: 103 mmol/L (ref 101–111)
CO2: 26 mmol/L (ref 22–32)
CREATININE: 1.35 mg/dL — AB (ref 0.61–1.24)
Calcium: 8.7 mg/dL — ABNORMAL LOW (ref 8.9–10.3)
GFR calc Af Amer: 55 mL/min — ABNORMAL LOW (ref >60)
GFR calc non Af Amer: 47 mL/min — ABNORMAL LOW (ref >60)
GLUCOSE: 134 mg/dL — AB (ref 65–99)
Potassium: 4.2 mmol/L (ref 3.5–5.1)
SODIUM: 136 mmol/L (ref 135–145)

## 2017-09-11 LAB — PROCALCITONIN

## 2017-09-11 LAB — CBC
HCT: 47.4 % (ref 39.0–52.0)
Hemoglobin: 16 g/dL (ref 13.0–17.0)
MCH: 30 pg (ref 26.0–34.0)
MCHC: 33.8 g/dL (ref 30.0–36.0)
MCV: 88.8 fL (ref 78.0–100.0)
PLATELETS: 168 10*3/uL (ref 150–400)
RBC: 5.34 MIL/uL (ref 4.22–5.81)
RDW: 14.3 % (ref 11.5–15.5)
WBC: 9.1 10*3/uL (ref 4.0–10.5)

## 2017-09-11 LAB — MAGNESIUM: MAGNESIUM: 2.2 mg/dL (ref 1.7–2.4)

## 2017-09-11 SURGERY — CARDIOVERSION
Anesthesia: General

## 2017-09-11 MED ORDER — SODIUM CHLORIDE 0.9 % IV SOLN
1.0000 g | Freq: Two times a day (BID) | INTRAVENOUS | Status: DC
  Administered 2017-09-12 – 2017-09-15 (×7): 1 g via INTRAVENOUS
  Filled 2017-09-11 (×8): qty 1

## 2017-09-11 MED ORDER — VANCOMYCIN HCL IN DEXTROSE 1-5 GM/200ML-% IV SOLN
1000.0000 mg | INTRAVENOUS | Status: DC
  Administered 2017-09-12 – 2017-09-14 (×3): 1000 mg via INTRAVENOUS
  Filled 2017-09-11 (×4): qty 200

## 2017-09-11 MED ORDER — LIDOCAINE HCL (CARDIAC) PF 100 MG/5ML IV SOSY
PREFILLED_SYRINGE | INTRAVENOUS | Status: DC | PRN
  Administered 2017-09-11: 80 mg via INTRATRACHEAL

## 2017-09-11 MED ORDER — PROPOFOL 10 MG/ML IV BOLUS
INTRAVENOUS | Status: DC | PRN
  Administered 2017-09-11: 100 mg via INTRAVENOUS

## 2017-09-11 MED ORDER — SODIUM CHLORIDE 0.9 % IV SOLN
2.0000 g | Freq: Once | INTRAVENOUS | Status: AC
  Administered 2017-09-11: 2 g via INTRAVENOUS
  Filled 2017-09-11: qty 2

## 2017-09-11 MED ORDER — SODIUM CHLORIDE 0.9 % IV SOLN
INTRAVENOUS | Status: DC | PRN
  Administered 2017-09-11: 12:00:00 via INTRAVENOUS

## 2017-09-11 MED ORDER — VANCOMYCIN HCL 10 G IV SOLR
1500.0000 mg | Freq: Once | INTRAVENOUS | Status: AC
  Administered 2017-09-11: 1500 mg via INTRAVENOUS
  Filled 2017-09-11: qty 1500

## 2017-09-11 NOTE — Progress Notes (Addendum)
EP attending  Progress Note  Patient Name: Thomas [name] Date of Encounter: 09/11/2017  Primary Cardiologist: Dr. Rockey Situ Electrophysiologist: Dr. Caryl Comes  Subjective   No complaints this AM, tolerating drug, no CP, no rest SOB, minimal cough  Inpatient Medications    Scheduled Meds: . amLODipine  5 mg Oral Daily  . apixaban  2.5 mg Oral BID  . dofetilide  125 mcg Oral BID  . febuxostat  20 mg Oral Daily  . fluticasone furoate-vilanterol  1 puff Inhalation Daily  . metoprolol succinate  50 mg Oral QHS  . sodium chloride flush  3 mL Intravenous Q12H  . sodium chloride flush  3 mL Intravenous Q12H  . terazosin  1 mg Oral QHS   Continuous Infusions: . sodium chloride    . sodium chloride     PRN Meds: sodium chloride, albuterol, hydrALAZINE, hydrocortisone cream, sodium chloride flush, sodium chloride flush, traMADol   Vital Signs    Vitals:   09/10/17 2056 09/10/17 2215 09/11/17 0525 09/11/17 0833  BP:   135/87   Pulse:  68 (!) 57   Resp:   17   Temp:   97.6 F (36.4 C)   TempSrc:   Oral   SpO2: 97%  98% 97%  Weight:   167 lb 11.2 oz (76.1 kg)   Height:        Intake/Output Summary (Last 24 hours) at 09/11/2017 0901 Last data filed at 09/10/2017 1857 Gross per 24 hour  Intake 1080 ml  Output -  Net 1080 ml   Filed Weights   09/09/17 2000 09/10/17 0532 09/11/17 0525  Weight: 168 lb (76.2 kg) 166 lb 8 oz (75.5 kg) 167 lb 11.2 oz (76.1 kg)    Telemetry    AF, V paced - Personally Reviewed  ECG    AF, V paced - Personally Reviewed  Physical Exam   No change in exam GEN: No acute distress.   Neck: No JVD Cardiac: RRR (paced), no murmurs, rubs, or gallops.  Respiratory:  CTA b/l GI: Soft, nontender, non-distended  MS: No edema; No deformity. Neuro:  Nonfocal  Psych: Normal affect   Labs    Chemistry Recent Labs  Lab 09/09/17 1058 09/10/17 0524 09/11/17 0516  NA 136 133* 136  K 4.3 4.4 4.2  CL 100* 99* 103  CO2 28 23 26   GLUCOSE 101* 212*  134*  BUN 14 17 23*  CREATININE 1.45* 1.68* 1.35*  CALCIUM 9.1 8.7* 8.7*  GFRNONAA 43* 36* 47*  GFRAA 50* 42* 55*  ANIONGAP 8 11 7      HematologyNo results for input(s): WBC, RBC, HGB, HCT, MCV, MCH, MCHC, RDW, PLT in the last 168 hours.  Cardiac EnzymesNo results for input(s): TROPONINI in the last 168 hours. No results for input(s): TROPIPOC in the last 168 hours.   BNPNo results for input(s): BNP, PROBNP in the last 168 hours.   DDimer No results for input(s): DDIMER in the last 168 hours.   Radiology    Ct Coronary Morph W/cta Cor W/score W/ca W/cm &/or Wo/cm Addendum Date: 09/10/2017   ADDENDUM REPORT: 09/10/2017 13:56 CLINICAL DATA:  Chest pain EXAM: Cardiac CTA MEDICATIONS: Sub lingual nitro.  4mg  x 2 and lopressor 5mg  IV TECHNIQUE: The patient was scanned on a Siemens 623 slice scanner. Gantry rotation speed was 250 msecs. Collimation was 0.6 mm. A 100 kV prospective scan was triggered in the ascending thoracic aorta at 35-75% of the R-R interval. Average HR during the scan was 60  bpm. The 3D data set was interpreted on a dedicated work station using MPR, MIP and VRT modes. A total of 80cc of contrast was used. FINDINGS: Non-cardiac: See separate report from [facility]. Pacemaker wire in heart. Calcium Score: 1366 Agatston units. Coronary Arteries: Right dominant with no anomalies LM: Short left main, calcified plaque near the ostium with mild stenosis. LAD system: Extensive mixed plaque in the proximal to mid LAD. There is an area in the mid LAD after the take-off of a small D2 that is concerning for severe stenosis. Circumflex system: Small caliber ramus with extensive calcified plaque. Cannot rule out moderate stenosis. Calcified plaque proximal LCx, mild stenosis. Calcified plaque mid LCx, around 50% stenosis. Calcified plaque in moderate PLOM, blooming artifact present, cannot rule out moderate stenosis but most likely mild. RCA system: Uninterpretable. The RCA is obscured  by artifact from pacemaker. IMPRESSION: 1. Coronary artery calcium score 1366 Agatston units places the patient at the 74th percentile for age and gender. This suggests intermediate risk for future cardiac events. 2.  The RCA is uninterpretable due to pacemaker artifact. 3.  Up to 50% stenosis in the mid LCx. 4.  I am concerned for severe stenosis in the mid LAD after D2. I will send for FFR of the LAD, but study may be rejected because of the uninterpretable RCA. Dalton Mclean Electronically Signed   By: Loralie Champagne M.D.   On: 09/10/2017 13:56   Result Date: 09/10/2017 EXAM: OVER-READ INTERPRETATION  CT CHEST The following report is an over-read performed by radiologist Dr. Vinnie Langton of [facility], [place] on 09/10/2017. This over-read does not include interpretation of cardiac or coronary anatomy or pathology. The coronary calcium score/coronary CTA interpretation by the cardiologist is attached. COMPARISON:  None. FINDINGS: Aortic atherosclerosis. Patchy multifocal peribronchovascular predominant ground-glass attenuation and micronodularity noted throughout the left lung, concerning for multilobar bronchopneumonia. No pleural effusions. No other suspicious appearing pulmonary nodules or masses are noted elsewhere in the visualized portions of the thorax. No lymphadenopathy. Visualized portions of the upper abdomen are unremarkable in appearance. There are no aggressive appearing lytic or blastic lesions noted in the visualized portions of the skeleton. IMPRESSION: 1. Findings are concerning for multilobar bronchopneumonia in the left lung. 2.  Aortic Atherosclerosis (ICD10-I70.0). Electronically Signed: By: Vinnie Langton M.D. On: 09/10/2017 11:46    Cardiac Studies   08/13/17: TTE Study Conclusions - Left ventricle: The cavity size was normal. Systolic function was normal. The estimated ejection fraction was in the range of 50% to 55%. Anteroseptal wall motion abnormality consistent  with paced rhythm. The study is not technically sufficient to allow evaluation of LV diastolic function. - Aortic valve: There was trivial regurgitation. Mean gradient (S): 5 mm Hg. - Mitral valve: There was mild to moderate regurgitation. - Left atrium: The atrium was moderately dilated. - Right ventricle: The cavity size was mildly dilated. Wall thickness was normal. Systolic function was mildly reduced. - Pulmonary arteries: Systolic pressure was mildly elevated. PA peak pressure: 41 mm Hg (S).   Patient Profile     84 y.o. male  CHB w/PPM, PAFib/flutter, arthritis, HTN, HLD, renal ca s/p cryotherapy, CVA/TIA, chronic Lyme disease, some degree of CAD described as peripheral, admitted for Tikosyn load, planned for cardiac/coronary CT w/FFR  Device information MDT dual chamber PPM, implanted 06/19/16  Assessment & Plan    1. Paroxysmal Afib     CHA2DS2Vasc is 6, on Eliquis     Here for Tikosyn initiation  K+ 4.2     Mag 2.2     Creat 1.35     QTc given paced QRS is OK to continue   Patient's Creat ranges 1.4-1.6  Dr. Caryl Comes has seen and exmained the patient, discussed with Dr. Caryl Comes given pending DCCV today his Eliquis dose of 2.5mg , he feels this is the appropriate dose for the patient and is OK to proceed with rhythm control strategy, drug and DCCV  2. HTN     Continue home regime  3. Exertional intolerance     Limited coronary visualization given pacer artifact, ? LAD lesion, unable to see RCA     FFR to evaluate LAD, may need cath     no reaction to contrast was observed w/prophylaxis  4. Pneumonia?     He had recently seen Dr. Ashby Dawes ([facility])     Started on [place] mentioning COPD     Patient suspected a component of recent cough to be post nasal gtt     We will ask pulmonary medicine to see today     He has been afebrile, appears non-toxic         For questions or updates, please contact [place] Please consult [email or web address]  for contact info under Cardiology/STEMI.      Signed, Baldwin Jamaica, PA-C  09/11/2017, 9:01 AM

## 2017-09-11 NOTE — Consult Note (Addendum)
Name: Thomas [name] MRN: [id number] DOB: July 02, 1932    ADMISSION DATE:  09/09/2017 CONSULTATION DATE:  5/9  REFERRING MD :  Caryl Comes (Cardiology)   CHIEF COMPLAINT:  Pulmonary infiltrate   BRIEF PATIENT DESCRIPTION: 84yo male never smoker with hx ?COPD (followed by [facility] pulmonary) AFib/flutter, HTN, CAD, chronic lyme disease, renal ca s/p cryotherapy who was admitted electively 5/7 for cardioversion and initiation of tikosyn therapy.   During workup he underwent cardiac CT which was concerning for L sided multilobar PNA and PCCM consulted 5/9.    SIGNIFICANT EVENTS  5/9 cardioversion   STUDIES:  Cardiac CT chest 5/9>>> 1. Findings are concerning for multilobar bronchopneumonia in the left lung. 2.  Aortic Atherosclerosis (ICD10-I70.0).   HISTORY OF PRESENT ILLNESS: 84yo male with hx COPD (followed by [facility] pulmonary) AFib/flutter, HTN, CAD, chronic lyme disease, renal ca s/p cryotherapy who was admitted electively 5/7 for cardioversion and initiation of tikosyn therapy.   During workup he underwent cardiac CT which was concerning for multilobar PNA and PCCM consulted 5/9.   Prior to admit pt was having some increased SOB, cough.  Saw Dr. Ashby Dawes in [place] 4/4 who started Brio which was apparently helpful but he still had a bit more SOB than usual.  Upon questioning he also admits to some intermittent ight sweats and low grade fevers.    Because of his lyme disease he has been on abx frequently, just finished a course of doxy 3 weeks ago.   Denies purulent sputum, hemoptysis, weight loss, dysphagia, chest pain.    PAST MEDICAL HISTORY :   has a past medical history of Aortic valve disorders, Arthritis, Atrial flutter (HCC) (06/18/2016), Basal cell carcinoma, BBB (bundle branch block), Chronic back pain, Complete heart block ([place]) (06/2016), Dyspnea, GERD (gastroesophageal reflux disease), Heart block, History of gout, History of hiatal hernia, Hyperlipidemia, Hypertension, Lyme  disease, Migraine, Presence of permanent cardiac pacemaker (06/19/2016), PVC's (premature ventricular contractions), Renal cancer, left ([place]) (2006), Spinal stenosis, Squamous carcinoma, Stroke ([place]), TIA (transient ischemic attack) (06/14/2016), and Visit for monitoring Tikosyn therapy (09/09/2017).  has a past surgical history that includes Laparoscopic ablation renal mass; Biopsy prostate (2001 & 2003); Tonsillectomy and adenoidectomy; Insert / replace / remove pacemaker (06/19/2016); Inguinal hernia repair (Left, 2012); Ankle fracture surgery (Right, 1967); Fracture surgery; Excision basal cell carcinoma; Squamous cell carcinoma excision; Cardiac catheterization (1990's); Prostate surgery; PACEMAKER IMPLANT (N/A, 06/19/2016); and pacemasker. Prior to Admission medications   Medication Sig Start Date End Date Taking? Authorizing Provider  Albuterol Sulfate 108 (90 Base) MCG/ACT AEPB Inhale 1-2 puffs into the lungs every 4 (four) hours as needed. Patient taking differently: Inhale 1-2 puffs into the lungs every 4 (four) hours as needed (for shortness of breath or wheezing).  07/31/17  Yes Jerrol Banana., MD  amLODipine (NORVASC) 5 MG tablet Take 1 tablet (5 mg total) 2 (two) times daily by mouth. Patient taking differently: Take 5 mg by mouth daily.  03/18/17  Yes Gollan, Kathlene November, MD  apixaban (ELIQUIS) 2.5 MG TABS tablet Take 2.5 mg by mouth 2 (two) times daily.   Yes [provider]  atorvastatin (LIPITOR) 40 MG tablet TAKE 1 TABLET BY MOUTH EVERY DAY AT 6PM 08/15/16  Yes Gollan, Kathlene November, MD  colchicine (COLCRYS) 0.6 MG tablet Take 1 tablet (0.6 mg total) by mouth as needed. Patient taking differently: Take 0.6 mg by mouth daily as needed (for gout).  12/12/16  Yes Birdie Sons, MD  diclofenac sodium (VOLTAREN) 1 %  GEL Apply 2 g topically daily as needed (for pain).  01/20/14  Yes [provider]  dutasteride (AVODART) 0.5 MG capsule Take 0.5 mg by mouth daily as needed  (for urination).    Yes [provider]  esomeprazole (NEXIUM) 40 MG capsule Take 40 mg by mouth daily as needed (for heartburn).    Yes [provider]  febuxostat (ULORIC) 40 MG tablet Take 20 mg by mouth daily.    Yes [provider]  fluticasone furoate-vilanterol (BREO ELLIPTA) 100-25 MCG/INH AEPB Inhale 1 puff into the lungs daily. 08/07/17  Yes Laverle Hobby, MD  hydrALAZINE (APRESOLINE) 25 MG tablet Take 1 tablet (25 mg total) by mouth 3 (three) times daily as needed. Patient taking differently: Take 25 mg by mouth 3 (three) times daily as needed (for BP > 150).  09/14/15  Yes Minna Merritts, MD  metoprolol succinate (TOPROL-XL) 50 MG 24 hr tablet Take 1 tablet (50 mg total) by mouth daily. Take with or immediately following a meal. Patient taking differently: Take 50 mg by mouth at bedtime. Take with or immediately following a meal. 09/17/16 09/09/17 Yes Deboraha Sprang, MD  metroNIDAZOLE (FLAGYL) 500 MG tablet Take 500 mg by mouth See admin instructions. Take 500 mg by mouth twice daily on Sunday and Monday 07/12/16  Yes [provider]  propranolol (INDERAL) 40 MG tablet TAKE ONE (1) TABLET THREE (3) TIMES EACHDAY AS NEEDED 09/17/16  Yes Deboraha Sprang, MD  silodosin (RAPAFLO) 8 MG CAPS capsule Take 8 mg by mouth daily as needed (for urintation).    Yes [provider]  terazosin (HYTRIN) 1 MG capsule Take 1 mg by mouth at bedtime.   Yes [provider]  testosterone cypionate (DEPOTESTOSTERONE CYPIONATE) 200 MG/ML injection Inject 1 mL (200 mg total) every 28 (twenty-eight) days into the muscle. 03/13/17  Yes Jerrol Banana., MD  traMADol (ULTRAM) 50 MG tablet Take 50 mg by mouth every 6 (six) hours as needed for moderate pain.  06/04/16  Yes [provider]  predniSONE (DELTASONE) 50 MG tablet Take 1 tablet (50 mg) 13 hours prior to CT, take 1 tablet (50 mg) 7 hours prior to CT, and take 1 tablet (50 mg) 1 hour prior to  CT Patient not taking: Reported on 09/09/2017 09/01/17   Deboraha Sprang, MD   Allergies  Allergen Reactions  . Iodine Hives and Other (See Comments)    IVP contrast  . Penicillins Itching, Rash and Other (See Comments)    ITCHY FEELING IN FINGERS Has patient had a PCN reaction causing immediate rash, facial/tongue/throat swelling, SOB or lightheadedness with hypotension: No Has patient had a PCN reaction causing severe rash involving mucus membranes or skin necrosis: No Has patient had a PCN reaction that required hospitalization No Has patient had a PCN reaction occurring within the last 10 years: No If all of the above answers are "NO", then may proceed with Cephalosporin use.     FAMILY HISTORY:  family history includes Aortic stenosis in his mother; Arthritis in his father; Heart attack in his brother; Stroke in his brother. SOCIAL HISTORY:  reports that he has never smoked. He has never used smokeless tobacco. He reports that he drinks alcohol. He reports that he does not use drugs.  REVIEW OF SYSTEMS:   As per HPI - All other systems reviewed and were neg.   SUBJECTIVE:   VITAL SIGNS: Temp:  [97.5 F (36.4 C)-97.9 F (36.6 C)] 97.7 F (  36.5 C) (05/09 1508) Pulse Rate:  [53-70] 69 (05/09 1508) Resp:  [15-20] 17 (05/09 1255) BP: (126-154)/(71-91) 136/82 (05/09 1508) SpO2:  [96 %-99 %] 96 % (05/09 1508) Weight:  [75.8 kg (167 lb)-76.1 kg (167 lb 11.2 oz)] 75.8 kg (167 lb) (05/09 1153)  PHYSICAL EXAMINATION: General:  Pleasant male, NAD, appears younger than stated age  Neuro:  Awake, alert, appropriate HEENT:  Mm moist, no JVD  Cardiovascular:  s1s2 rrr Lungs:  Resps even non labored on RA, few scattered crackles L>R, mild exp wheeze  Abdomen:  Round, soft, +bs  Musculoskeletal:  Warm and dry, no edema   Recent Labs  Lab 09/09/17 1058 09/10/17 0524 09/11/17 0516  NA 136 133* 136  K 4.3 4.4 4.2  CL 100* 99* 103  CO2 28 23 26   BUN 14 17 23*  CREATININE 1.45*  1.68* 1.35*  GLUCOSE 101* 212* 134*   No results for input(s): HGB, HCT, WBC, PLT in the last 168 hours. Ct Coronary Morph W/cta Cor W/score W/ca W/cm &/or Wo/cm  Addendum Date: 09/10/2017   ADDENDUM REPORT: 09/10/2017 13:56 CLINICAL DATA:  Chest pain EXAM: Cardiac CTA MEDICATIONS: Sub lingual nitro.  4mg  x 2 and lopressor 5mg  IV TECHNIQUE: The patient was scanned on a Siemens 993 slice scanner. Gantry rotation speed was 250 msecs. Collimation was 0.6 mm. A 100 kV prospective scan was triggered in the ascending thoracic aorta at 35-75% of the R-R interval. Average HR during the scan was 60 bpm. The 3D data set was interpreted on a dedicated work station using MPR, MIP and VRT modes. A total of 80cc of contrast was used. FINDINGS: Non-cardiac: See separate report from [facility]. Pacemaker wire in heart. Calcium Score: 1366 Agatston units. Coronary Arteries: Right dominant with no anomalies LM: Short left main, calcified plaque near the ostium with mild stenosis. LAD system: Extensive mixed plaque in the proximal to mid LAD. There is an area in the mid LAD after the take-off of a small D2 that is concerning for severe stenosis. Circumflex system: Small caliber ramus with extensive calcified plaque. Cannot rule out moderate stenosis. Calcified plaque proximal LCx, mild stenosis. Calcified plaque mid LCx, around 50% stenosis. Calcified plaque in moderate PLOM, blooming artifact present, cannot rule out moderate stenosis but most likely mild. RCA system: Uninterpretable. The RCA is obscured by artifact from pacemaker. IMPRESSION: 1. Coronary artery calcium score 1366 Agatston units places the patient at the 74th percentile for age and gender. This suggests intermediate risk for future cardiac events. 2.  The RCA is uninterpretable due to pacemaker artifact. 3.  Up to 50% stenosis in the mid LCx. 4.  I am concerned for severe stenosis in the mid LAD after D2. I will send for FFR of the LAD, but study may  be rejected because of the uninterpretable RCA. Dalton Mclean Electronically Signed   By: Loralie Champagne M.D.   On: 09/10/2017 13:56   Result Date: 09/10/2017 EXAM: OVER-READ INTERPRETATION  CT CHEST The following report is an over-read performed by radiologist Dr. Vinnie Langton of [facility], [place] on 09/10/2017. This over-read does not include interpretation of cardiac or coronary anatomy or pathology. The coronary calcium score/coronary CTA interpretation by the cardiologist is attached. COMPARISON:  None. FINDINGS: Aortic atherosclerosis. Patchy multifocal peribronchovascular predominant ground-glass attenuation and micronodularity noted throughout the left lung, concerning for multilobar bronchopneumonia. No pleural effusions. No other suspicious appearing pulmonary nodules or masses are noted elsewhere in the visualized portions of the thorax. No  lymphadenopathy. Visualized portions of the upper abdomen are unremarkable in appearance. There are no aggressive appearing lytic or blastic lesions noted in the visualized portions of the skeleton. IMPRESSION: 1. Findings are concerning for multilobar bronchopneumonia in the left lung. 2.  Aortic Atherosclerosis (ICD10-I70.0). Electronically Signed: By: Vinnie Langton M.D. On: 09/10/2017 11:46   Ct Coronary Fractional Flow Reserve Data Prep  Result Date: 09/11/2017 CLINICAL DATA:  Chest pain EXAM: CT FFR MEDICATIONS: No additional medications TECHNIQUE: The coronary CTA was sent for FFR. FINDINGS: FFR 0.72 mid LAD. FFR 0.81 distal LCx. IMPRESSION: 1.  Hemodynamically significant mid LAD stenosis. 2.  Probably not hemodynamically significant mid LCx stenosis. Dalton Mclean Electronically Signed   By: Loralie Champagne M.D.   On: 09/11/2017 14:29    ASSESSMENT / PLAN:  HCAP- has had more dyspnea, low grade fevers, intermittent night sweats.   Hx ?COPD - never smoker. FEV1 1.80 (74% pred.) office spirometry 08/2017  PLAN -  Check CBC, pct, BC x 2  F/u  CXR in am  Sputum culture if able to expectorate  Will cover for HCAP with Vanc, ceftaz (has tolerated cephalosporin in the past)  Pulmonary hygiene  Continue breo, PRN albuterol     Nickolas Madrid, NP 09/11/2017  4:39 PM Pager: [phone] or [phone]  Attending Note:  84 year old male with PMH of lyme disease for which he has been on abx for three year who presents for a cardiac CT.  On CT that I reviewed myself there were infiltrate noted.  On exam, wheezes diffusely with L>R crackles.  He reports cough productive of green sputum, fever and night sweats.  Discussed with PCCM-NP.  HCAP:  - Cefepime  - Vanc  - Pan culture  Hypoxemia:  - Titrate O2 for sat of 88-92%  Fever:   - Tylenol  Sepsis:  - Procalcitonin  - CBC for WBC  PCCM will follow.  Rush Farmer, M.D. [facility] Pulmonary/Critical Care Medicine. Pager: [phone]. After hours pager: [phone].

## 2017-09-11 NOTE — Anesthesia Postprocedure Evaluation (Signed)
Anesthesia Post Note  Patient: Thomas [name]  Procedure(s) Performed: CARDIOVERSION (N/A )     Patient location during evaluation: PACU Anesthesia Type: General Level of consciousness: awake and alert Pain management: pain level controlled Vital Signs Assessment: post-procedure vital signs reviewed and stable Respiratory status: spontaneous breathing, nonlabored ventilation, respiratory function stable and patient connected to nasal cannula oxygen Cardiovascular status: blood pressure returned to baseline and stable Postop Assessment: no apparent nausea or vomiting Anesthetic complications: no    Last Vitals:  Vitals:   09/11/17 1508 09/11/17 2056  BP: 136/82   Pulse: 69   Resp:    Temp: 36.5 C   SpO2: 96% 97%    Last Pain:  Vitals:   09/11/17 1508  TempSrc: Oral  PainSc:                  Arlo Buffone P Lorenzo Pereyra

## 2017-09-11 NOTE — Transfer of Care (Signed)
Immediate Anesthesia Transfer of Care Note  Patient: Thomas [name]  Procedure(s) Performed: CARDIOVERSION (N/A )  Patient Location: Endoscopy Unit  Anesthesia Type:General  Level of Consciousness: awake, alert  and oriented  Airway & Oxygen Therapy: Patient Spontanous Breathing and Patient connected to nasal cannula oxygen  Post-op Assessment: Report given to RN, Post -op Vital signs reviewed and stable and Patient moving all extremities X 4  Post vital signs: Reviewed and stable  Last Vitals:  Vitals Value Taken Time  BP    Temp    Pulse 70 09/11/2017 12:42 PM  Resp 19 09/11/2017 12:42 PM  SpO2 99 % 09/11/2017 12:42 PM  Vitals shown include unvalidated device data.  Last Pain:  Vitals:   09/11/17 1212  TempSrc:   PainSc: 0-No pain      Patients Stated Pain Goal: 0 (86/38/17 7116)  Complications: No apparent anesthesia complications

## 2017-09-11 NOTE — Interval H&P Note (Signed)
History and Physical Interval Note:  09/11/2017 12:17 PM  Thomas [name]  has presented today for surgery, with the diagnosis of a fib  The various methods of treatment have been discussed with the patient and family. After consideration of risks, benefits and other options for treatment, the patient has consented to  Procedure(s): CARDIOVERSION (N/A) as a surgical intervention .  The patient's history has been reviewed, patient examined, no change in status, stable for surgery.  I have reviewed the patient's chart and labs.  Questions were answered to the patient's satisfaction.     [name]

## 2017-09-11 NOTE — Consult Note (Signed)
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 Patientreports having lack of energy, easily gets tired and short of breath; admitted for follow-up and management of paroxysmal atrial fibrillation thathad ledto this admission.(Tikosyn load/ monitoring)  Patientendorses Thomas [name] with [employer] as his primary care provider.   Patient [facility] in [place] to obtain medications without difficulty.   Patient reportsmanaginghis ownmedications at [employer] out of the containers using his own organizing system.  Patient statesthathe has been driving prior to admission, but wife will be able to providetransportation to hisdoctors'appointments if needed after discharge.  Patientmentionedthathe liveswith wife who will assistwith care needs at home if needed.  Anticipateddischarge planishomeaccording to patient.  Patientand wife voiced understanding to call primary care provider's officewhen hereturnshomefor a post discharge follow-up visit within1- 2weeksor sooner if needs arise.Patient letter (with PCP's contact number) was provided asareminder.   Discussed with patientregarding [facility] CM services available for health management at home buthedeniesany needs or concerns for now.  Patientverbalized understanding to seekreferral from primary care provider to [facility] care management if deemednecessaryand appropriate for any services in the future.  [facility] care management information was provided for future needs thathe may have.  Patienthadverbally agreedforEMMIcalls tofollowup hisrecoveryat home.  Referral made for [facility] General calls after  discharge.   For additional questions please contact:  Edwena Felty A. Rena Hunke, BSN, RN-BC [facility] PRIMARY CARE Navigator Cell: [phone]

## 2017-09-11 NOTE — Plan of Care (Signed)
  Problem: Clinical Measurements: Goal: Will remain free from infection Outcome: Progressing Note:  No s/s of infection noted.   Problem: Coping: Goal: Level of anxiety will decrease Outcome: Progressing Note:  No s/s of anxiety noted.   

## 2017-09-11 NOTE — Progress Notes (Signed)
Pharmacy Antibiotic Note  Thomas [name] is a 84 y.o. male admitted on 09/09/2017 for Tikosyn initiation.  Found to have multilobar PNA on CT and Pharmacy consulted for vancomycin and ceftazidime dosing.  Patient has a history of renal cancer; renal function is improving, estimated CrCL 39 ml/min.  Afebrile.  Noted patient has itching and rash with penicillins.   Plan: Vanc 1500mg  IV x 1, then 1gm IV Q24H Fortaz 2gm IV x 1, then 1gm IV Q12H Monitor renal fxn, clinical course, vanc trough as indicated Monitor cephalosporin tolerance   Height: 5\' 7"  (170.2 cm) Weight: 167 lb (75.8 kg) IBW/kg (Calculated) : 66.1  Temp (24hrs), Avg:97.7 F (36.5 C), Min:97.5 F (36.4 C), Max:97.9 F (36.6 C)  Recent Labs  Lab 09/09/17 1058 09/10/17 0524 09/11/17 0516  CREATININE 1.45* 1.68* 1.35*    Estimated Creatinine Clearance: 39.4 mL/min (A) (by C-G formula based on SCr of 1.35 mg/dL (H)).    Allergies  Allergen Reactions  . Iodine Hives and Other (See Comments)    IVP contrast  . Penicillins Itching, Rash and Other (See Comments)    ITCHY FEELING IN FINGERS Has patient had a PCN reaction causing immediate rash, facial/tongue/throat swelling, SOB or lightheadedness with hypotension: No Has patient had a PCN reaction causing severe rash involving mucus membranes or skin necrosis: No Has patient had a PCN reaction that required hospitalization No Has patient had a PCN reaction occurring within the last 10 years: No If all of the above answers are "NO", then may proceed with Cephalosporin use.     Vanc 5/9 >> [name] 5/9 >>  5/9 BCx -  5/9 sputum cx -    Asa Baudoin D. Mina Marble, PharmD, BCPS, [place] Pager:  [phone] 09/11/2017, 4:39 PM

## 2017-09-11 NOTE — Anesthesia Preprocedure Evaluation (Addendum)
Anesthesia Evaluation  Patient identified by MRN, date of birth, ID band Patient awake    Reviewed: Allergy & Precautions, NPO status , Patient's Chart, lab work & pertinent test results  Airway Mallampati: II  TM Distance: >3 FB Neck ROM: Full    Dental no notable dental hx.    Pulmonary neg pulmonary ROS,    Pulmonary exam normal breath sounds clear to auscultation       Cardiovascular hypertension, Normal cardiovascular exam+ dysrhythmias Atrial Fibrillation + pacemaker  Rhythm:Regular Rate:Normal  ECG: V-paced, rate 59  ECHO: LV EF: 50% -   55%   Neuro/Psych TIACVA negative psych ROS   GI/Hepatic Neg liver ROS, hiatal hernia, GERD  Medicated,  Endo/Other  negative endocrine ROS  Renal/GU negative Renal ROS     Musculoskeletal Spinal stenosis Gout Chronic back pain   Abdominal   Peds  Hematology HLD   Anesthesia Other Findings a fib  Reproductive/Obstetrics                            Anesthesia Physical Anesthesia Plan  ASA: III  Anesthesia Plan: General   Post-op Pain Management:    Induction: Intravenous  PONV Risk Score and Plan: 2 and Propofol infusion and Treatment may vary due to age or medical condition  Airway Management Planned: Mask  Additional Equipment:   Intra-op Plan:   Post-operative Plan:   Informed Consent: I have reviewed the patients History and Physical, chart, labs and discussed the procedure including the risks, benefits and alternatives for the proposed anesthesia with the patient or authorized representative who has indicated his/her understanding and acceptance.   Dental advisory given  Plan Discussed with: CRNA  Anesthesia Plan Comments:         Anesthesia Quick Evaluation

## 2017-09-11 NOTE — Procedures (Signed)
Electrical Cardioversion Procedure Note Thomas [name] [id number] [birth year]  Procedure: Electrical Cardioversion Indications:  Atrial Fibrillation  Procedure Details Consent: Risks of procedure as well as the alternatives and risks of each were explained to the (patient/caregiver).  Consent for procedure obtained. Time Out: Verified patient identification, verified procedure, site/side was marked, verified correct patient position, special equipment/implants available, medications/allergies/relevent history reviewed, required imaging and test results available.  Performed  Patient placed on cardiac monitor, pulse oximetry, supplemental oxygen as necessary.  Sedation given: Pt sedated by anesthesia with lidocaine 80 mg and diprovan 100 mg IV. Pacer pads placed anterior and posterior chest.  Cardioverted 2 time(s).  Cardioverted at 120J and 150J.  Evaluation Findings: Post procedure rhythm strip shows AV pacing. Complications: None Patient did tolerate procedure well.   Thomas [name] 09/11/2017, 11:55 AM

## 2017-09-12 ENCOUNTER — Other Ambulatory Visit: Payer: Self-pay

## 2017-09-12 ENCOUNTER — Inpatient Hospital Stay (HOSPITAL_COMMUNITY): Payer: Medicare Other

## 2017-09-12 ENCOUNTER — Encounter (HOSPITAL_COMMUNITY): Payer: Self-pay | Admitting: Cardiology

## 2017-09-12 LAB — BASIC METABOLIC PANEL
ANION GAP: 7 (ref 5–15)
BUN: 23 mg/dL — ABNORMAL HIGH (ref 6–20)
CHLORIDE: 102 mmol/L (ref 101–111)
CO2: 26 mmol/L (ref 22–32)
Calcium: 8.5 mg/dL — ABNORMAL LOW (ref 8.9–10.3)
Creatinine, Ser: 1.47 mg/dL — ABNORMAL HIGH (ref 0.61–1.24)
GFR calc non Af Amer: 43 mL/min — ABNORMAL LOW (ref >60)
GFR, EST AFRICAN AMERICAN: 49 mL/min — AB (ref >60)
Glucose, Bld: 86 mg/dL (ref 65–99)
POTASSIUM: 4.2 mmol/L (ref 3.5–5.1)
Sodium: 135 mmol/L (ref 135–145)

## 2017-09-12 LAB — MAGNESIUM: Magnesium: 2.1 mg/dL (ref 1.7–2.4)

## 2017-09-12 NOTE — Progress Notes (Addendum)
EP attending  Progress Note  Patient Name: Thomas [name] Date of Encounter: 09/12/2017  Primary Cardiologist: Dr. Rockey Situ Electrophysiologist: Dr. Caryl Comes  Subjective   No complaints this AM, tolerating drug, no CP, no rest SOB, cough is more productive  Inpatient Medications    Scheduled Meds: . amLODipine  5 mg Oral Daily  . apixaban  2.5 mg Oral BID  . dofetilide  125 mcg Oral BID  . febuxostat  20 mg Oral Daily  . fluticasone furoate-vilanterol  1 puff Inhalation Daily  . metoprolol succinate  50 mg Oral QHS  . sodium chloride flush  3 mL Intravenous Q12H  . sodium chloride flush  3 mL Intravenous Q12H  . terazosin  1 mg Oral QHS   Continuous Infusions: . sodium chloride    . sodium chloride 1 mL/hr at 09/11/17 1156  . cefTAZidime (FORTAZ)  IV 1 g (09/12/17 8250)  . vancomycin    . vancomycin     PRN Meds: sodium chloride, albuterol, hydrALAZINE, hydrocortisone cream, sodium chloride flush, sodium chloride flush, traMADol   Vital Signs    Vitals:   09/11/17 2056 09/11/17 2153 09/11/17 2223 09/12/17 0633  BP:   131/84 122/70  Pulse:  71 69 68  Resp:   18 17  Temp:   97.7 F (36.5 C) 98.3 F (36.8 C)  TempSrc:   Oral Oral  SpO2: 97%  96% 100%  Weight:    165 lb 11.2 oz (75.2 kg)  Height:        Intake/Output Summary (Last 24 hours) at 09/12/2017 0720 Last data filed at 09/11/2017 2359 Gross per 24 hour  Intake 642.05 ml  Output 0 ml  Net 642.05 ml   Filed Weights   09/11/17 0525 09/11/17 1153 09/12/17 0633  Weight: 167 lb 11.2 oz (76.1 kg) 167 lb (75.8 kg) 165 lb 11.2 oz (75.2 kg)    Telemetry    AV paced, occ PVCs (seen pre-initiation as well), PACs - Personally Reviewed  ECG    Sinus/A paced w/V paced  - Personally Reviewed  Physical Exam   GEN: No acute distress.   Neck: No JVD Cardiac: RRR (paced), no murmurs, rubs, or gallops.  Respiratory:  Scattered crackles L GI: Soft, nontender, non-distended  MS: No edema; No deformity. Neuro:   Nonfocal  Psych: Normal affect   Labs    Chemistry Recent Labs  Lab 09/10/17 0524 09/11/17 0516 09/12/17 0523  NA 133* 136 135  K 4.4 4.2 4.2  CL 99* 103 102  CO2 23 26 26   GLUCOSE 212* 134* 86  BUN 17 23* 23*  CREATININE 1.68* 1.35* 1.47*  CALCIUM 8.7* 8.7* 8.5*  GFRNONAA 36* 47* 43*  GFRAA 42* 55* 49*  ANIONGAP 11 7 7      Hematology Recent Labs  Lab 09/11/17 1620  WBC 9.1  RBC 5.34  HGB 16.0  HCT 47.4  MCV 88.8  MCH 30.0  MCHC 33.8  RDW 14.3  PLT 168    Cardiac EnzymesNo results for input(s): TROPONINI in the last 168 hours. No results for input(s): TROPIPOC in the last 168 hours.   BNPNo results for input(s): BNP, PROBNP in the last 168 hours.   DDimer No results for input(s): DDIMER in the last 168 hours.   Radiology    Ct Coronary Morph W/cta Cor W/score W/ca W/cm &/or Wo/cm Addendum Date: 09/10/2017   ADDENDUM REPORT: 09/10/2017 13:56 CLINICAL DATA:  Chest pain EXAM: Cardiac CTA MEDICATIONS: Sub lingual nitro.  4mg  x  2 and lopressor 5mg  IV TECHNIQUE: The patient was scanned on a Siemens 741 slice scanner. Gantry rotation speed was 250 msecs. Collimation was 0.6 mm. A 100 kV prospective scan was triggered in the ascending thoracic aorta at 35-75% of the R-R interval. Average HR during the scan was 60 bpm. The 3D data set was interpreted on a dedicated work station using MPR, MIP and VRT modes. A total of 80cc of contrast was used. FINDINGS: Non-cardiac: See separate report from [facility]. Pacemaker wire in heart. Calcium Score: 1366 Agatston units. Coronary Arteries: Right dominant with no anomalies LM: Short left main, calcified plaque near the ostium with mild stenosis. LAD system: Extensive mixed plaque in the proximal to mid LAD. There is an area in the mid LAD after the take-off of a small D2 that is concerning for severe stenosis. Circumflex system: Small caliber ramus with extensive calcified plaque. Cannot rule out moderate stenosis. Calcified  plaque proximal LCx, mild stenosis. Calcified plaque mid LCx, around 50% stenosis. Calcified plaque in moderate PLOM, blooming artifact present, cannot rule out moderate stenosis but most likely mild. RCA system: Uninterpretable. The RCA is obscured by artifact from pacemaker. IMPRESSION: 1. Coronary artery calcium score 1366 Agatston units places the patient at the 74th percentile for age and gender. This suggests intermediate risk for future cardiac events. 2.  The RCA is uninterpretable due to pacemaker artifact. 3.  Up to 50% stenosis in the mid LCx. 4.  I am concerned for severe stenosis in the mid LAD after D2. I will send for FFR of the LAD, but study may be rejected because of the uninterpretable RCA. Dalton Mclean Electronically Signed   By: Loralie Champagne M.D.   On: 09/10/2017 13:56   Result Date: 09/10/2017 EXAM: OVER-READ INTERPRETATION  CT CHEST The following report is an over-read performed by radiologist Dr. Vinnie Langton of [facility], [place] on 09/10/2017. This over-read does not include interpretation of cardiac or coronary anatomy or pathology. The coronary calcium score/coronary CTA interpretation by the cardiologist is attached. COMPARISON:  None. FINDINGS: Aortic atherosclerosis. Patchy multifocal peribronchovascular predominant ground-glass attenuation and micronodularity noted throughout the left lung, concerning for multilobar bronchopneumonia. No pleural effusions. No other suspicious appearing pulmonary nodules or masses are noted elsewhere in the visualized portions of the thorax. No lymphadenopathy. Visualized portions of the upper abdomen are unremarkable in appearance. There are no aggressive appearing lytic or blastic lesions noted in the visualized portions of the skeleton. IMPRESSION: 1. Findings are concerning for multilobar bronchopneumonia in the left lung. 2.  Aortic Atherosclerosis (ICD10-I70.0). Electronically Signed: By: Vinnie Langton M.D. On: 09/10/2017 11:46     Cardiac Studies   08/13/17: TTE Study Conclusions - Left ventricle: The cavity size was normal. Systolic function was normal. The estimated ejection fraction was in the range of 50% to 55%. Anteroseptal wall motion abnormality consistent with paced rhythm. The study is not technically sufficient to allow evaluation of LV diastolic function. - Aortic valve: There was trivial regurgitation. Mean gradient (S): 5 mm Hg. - Mitral valve: There was mild to moderate regurgitation. - Left atrium: The atrium was moderately dilated. - Right ventricle: The cavity size was mildly dilated. Wall thickness was normal. Systolic function was mildly reduced. - Pulmonary arteries: Systolic pressure was mildly elevated. PA peak pressure: 41 mm Hg (S).   Patient Profile     84 y.o. male  CHB w/PPM, PAFib/flutter, arthritis, HTN, HLD, renal ca s/p cryotherapy, CVA/TIA, chronic Lyme disease, some degree of CAD  described as peripheral, admitted for Tikosyn load, planned for cardiac/coronary CT w/FFR  Device information MDT dual chamber PPM, implanted 06/19/16  Assessment & Plan    1. Paroxysmal Afib     CHA2DS2Vasc is 6, on Eliquis, in d/w Dr. Caryl Comes, 2.5mg  BID dosing given waxing/waning Creat s appropriately dosed     Here for Tikosyn initiation     K+ 4.2     Mag 2.1     Creat 1.47     QTc given paced QRS is OK to continue   Patient's Creat ranges 1.4-1.6   S/p DCCV base pacing rate increased 70bpm, patient inquires of timing to return to 50, will d/w Dr. Caryl Comes  2. HTN     Continue home regime  3. Exertional intolerance     Limited coronary visualization given pacer artifact, ? LAD lesion, unable to see RCA     FFR to evaluate LAD, may need cath     no reaction to contrast was observed w/prophylaxis  4. Pneumonia     He had recently seen Dr. Ashby Dawes ([facility])     Started on [place] mentioning COPD     Patient suspected a component of recent cough to be post  nasal gtt      Appreciated Pulmonary medicine      He remains afebrile, appears non-toxic     BC drawn, pending     Sputum Cx pending     CXR pending     Antibiotics as per recs       Discharge timing will depend on pneumonia management needs (timing deferred to pulm services)     For questions or updates, please contact [place] Please consult [email or web address] for contact info under Cardiology/STEMI.      Signed, Baldwin [place], PA-C  09/12/2017, 7:20 AM    Holding sinus rhythm.  Appreciate pulmonary input.  Recommended IV antibiotics for the next 48 hours and change to p.o. on Sunday and discharge on Monday.  Continue Tikosyn

## 2017-09-12 NOTE — Plan of Care (Signed)
  Problem: Clinical Measurements: Goal: Respiratory complications will improve Outcome: Progressing Note:  Nebs and flutter valve effective.  Awaiting sputum culture results.   Problem: Education: Goal: Knowledge of General Education information will improve Outcome: Completed/Met   Problem: Coping: Goal: Level of anxiety will decrease Outcome: Completed/Met Note:  No s/s of anxiety.

## 2017-09-12 NOTE — Progress Notes (Addendum)
Name: Thomas [name] MRN: [id number] DOB: 1932/12/30    ADMISSION DATE:  09/09/2017 CONSULTATION DATE:  5/9  REFERRING MD :  Caryl Comes (Cardiology)   CHIEF COMPLAINT:  Pulmonary infiltrate   BRIEF PATIENT DESCRIPTION: 84yo male never smoker with hx ?COPD (followed by [facility] pulmonary) AFib/flutter, HTN, CAD, chronic lyme disease, renal ca s/p cryotherapy who was admitted electively 5/7 for cardioversion and initiation of tikosyn therapy.   During workup he underwent cardiac CT which was concerning for L sided multilobar PNA and PCCM consulted 5/9.    SIGNIFICANT EVENTS  5/9 cardioversion   STUDIES:  Cardiac CT chest 5/9>>> 1. Findings are concerning for multilobar bronchopneumonia in the left lung. 2.  Aortic Atherosclerosis (ICD10-I70.0).  Cultures 09/11/2017 Blood x 2>>   HISTORY OF PRESENT ILLNESS: 84yo male with hx COPD (followed by [facility] pulmonary) AFib/flutter, HTN, CAD, chronic lyme disease, renal ca s/p cryotherapy who was admitted electively 5/7 for cardioversion and initiation of tikosyn therapy.   During workup he underwent cardiac CT which was concerning for multilobar PNA and PCCM consulted 5/9.   Prior to admit pt was having some increased SOB, cough.  Saw Dr. Ashby Dawes in [facility] 4/4 who started Brio which was apparently helpful but he still had a bit more SOB than usual.  Upon questioning he also admits to some intermittent night sweats and low grade fevers.    Because of his lyme disease he has been on abx frequently, just finished a course of doxy 3 weeks ago.   Denies purulent sputum, hemoptysis, weight loss, dysphagia, chest pain.     SUBJECTIVE:  Feels some better. On RA  VITAL SIGNS: Temp:  [97.5 F (36.4 C)-98.3 F (36.8 C)] 98.3 F (36.8 C) (05/10 3785) Pulse Rate:  [66-71] 68 (05/10 0633) Resp:  [15-20] 17 (05/10 0633) BP: (122-154)/(70-91) 122/70 (05/10 0633) SpO2:  [96 %-100 %] 100 % (05/10 0633) Weight:  [165 lb 11.2 oz (75.2 kg)-167 lb (75.8  kg)] 165 lb 11.2 oz (75.2 kg) (05/10 8850)  PHYSICAL EXAMINATION: General:  Pleasant male, NAD,  Sitting in chair  Neuro:  Awake and alert, appropriate, MAE x 4, A&O x 3 HEENT:  NCAT, MM Pink and Moist, NO JVD  Cardiovascular:  S1, S2, RRR, No RMG Lungs:  Bilateral excursion, Resps even non labored on RA, Few crackles per bases Abdomen:  Sofy, ND, NT, BS +  Musculoskeletal:  No obvious deformities noted Skin: Warm and dry, Intact  Recent Labs  Lab 09/10/17 0524 09/11/17 0516 09/12/17 0523  NA 133* 136 135  K 4.4 4.2 4.2  CL 99* 103 102  CO2 23 26 26   BUN 17 23* 23*  CREATININE 1.68* 1.35* 1.47*  GLUCOSE 212* 134* 86   Recent Labs  Lab 09/11/17 1620  HGB 16.0  HCT 47.4  WBC 9.1  PLT 168   Ct Coronary Fractional Flow Reserve Data Prep  Result Date: 09/11/2017 CLINICAL DATA:  Chest pain EXAM: CT FFR MEDICATIONS: No additional medications TECHNIQUE: The coronary CTA was sent for FFR. FINDINGS: FFR 0.72 mid LAD. FFR 0.81 distal LCx. IMPRESSION: 1.  Hemodynamically significant mid LAD stenosis. 2.  Probably not hemodynamically significant mid LCx stenosis. Dalton Mclean Electronically Signed   By: Loralie Champagne M.D.   On: 09/11/2017 14:29    ASSESSMENT / PLAN:  HCAP- has had more dyspnea, low grade fevers, intermittent night sweats.   Hx ?COPD - never smoker. FEV1 1.80 (74% pred.) office spirometry 08/2017 ? HCAP PCT < 0.10  WBC >> 9.1 T Max 98.3 PLAN -  Trend fever, WBC F/u CXR prn Sputum culture if able to expectorate  Continue  Vanc, ceftaz (has tolerated cephalosporin in the past)  Follow micro to de-escalate as able Pulmonary hygiene, Flutter valve  Consider Mucinex for mucus  clearance  Mobilize as able Continue Breo, PRN albuterol nebs Maintain sats 88-92%  Tylenol for fever prn   All other care per primary team.  Magdalen Spatz, AGACNP-BC 09/12/2017  10:58 AM [phone]   Still has some cough.  Neb tx help with expectoration.  BP 122/70  (BP Location: Right Arm)   Pulse 68   Temp 98.3 F (36.8 C) (Oral)   Resp 17   Ht 5\' 7"  (1.702 m)   Wt 165 lb 11.2 oz (75.2 kg)   SpO2 100%   BMI 25.95 kg/m   Alert.  HR regular.  No wheeze.  ABd soft.  No edema.  CMP Latest Ref Rng & Units 09/12/2017 09/11/2017 09/10/2017  Glucose 65 - 99 mg/dL 86 134(H) 212(H)  BUN 6 - 20 mg/dL 23(H) 23(H) 17  Creatinine 0.61 - 1.24 mg/dL 1.47(H) 1.35(H) 1.68(H)  Sodium 135 - 145 mmol/L 135 136 133(L)  Potassium 3.5 - 5.1 mmol/L 4.2 4.2 4.4  Chloride 101 - 111 mmol/L 102 103 99(L)  CO2 22 - 32 mmol/L 26 26 23   Calcium 8.9 - 10.3 mg/dL 8.5(L) 8.7(L) 8.7(L)  Total Protein 6.0 - 8.5 g/dL - - -  Total Bilirubin 0.0 - 1.2 mg/dL - - -  Alkaline Phos 39 - 117 IU/L - - -  AST 0 - 40 IU/L - - -  ALT 0 - 44 IU/L - - -   CBC Latest Ref Rng & Units 09/11/2017 07/29/2017 10/31/2016  WBC 4.0 - 10.5 K/uL 9.1 7.1 6.3  Hemoglobin 13.0 - 17.0 g/dL 16.0 15.3 14.4  Hematocrit 39.0 - 52.0 % 47.4 45.9 42.4  Platelets 150 - 400 K/uL 168 182 192   Assessment/plan:  HCAP. - day 2 of vancomycin, fortaz - might be able to transition to oral Abx over weekend if he remains stable  Possible COPD. - continue breo, prn albuterol  PCCM will f/u on Monday 09/15/17 if he remains in hospital.  He has pulmonary in [place] he will f/u with.  Call if help needed sooner while he is in hospital.  Chesley Mires, MD [place] 09/12/2017, 11:54 AM

## 2017-09-13 ENCOUNTER — Inpatient Hospital Stay (HOSPITAL_COMMUNITY): Payer: Medicare Other

## 2017-09-13 DIAGNOSIS — J181 Lobar pneumonia, unspecified organism: Secondary | ICD-10-CM

## 2017-09-13 DIAGNOSIS — N183 Chronic kidney disease, stage 3 (moderate): Secondary | ICD-10-CM

## 2017-09-13 LAB — EXPECTORATED SPUTUM ASSESSMENT W GRAM STAIN, RFLX TO RESP C

## 2017-09-13 LAB — EXPECTORATED SPUTUM ASSESSMENT W REFEX TO RESP CULTURE: SPECIAL REQUESTS: NORMAL

## 2017-09-13 IMAGING — CR DG CHEST 2V
1 series · 2 of 2 positions shown · non-contrast
Comparison: PA and lateral chest 06/19/2016.

CLINICAL DATA: Numbness in the left arm since pacemaker placement
06/19/2016.

EXAM:
CHEST  2 VIEW

[Series 1: dg chest 2 view · 0.14mm/px · 2 of 2 slices shown]
[im 1/2]
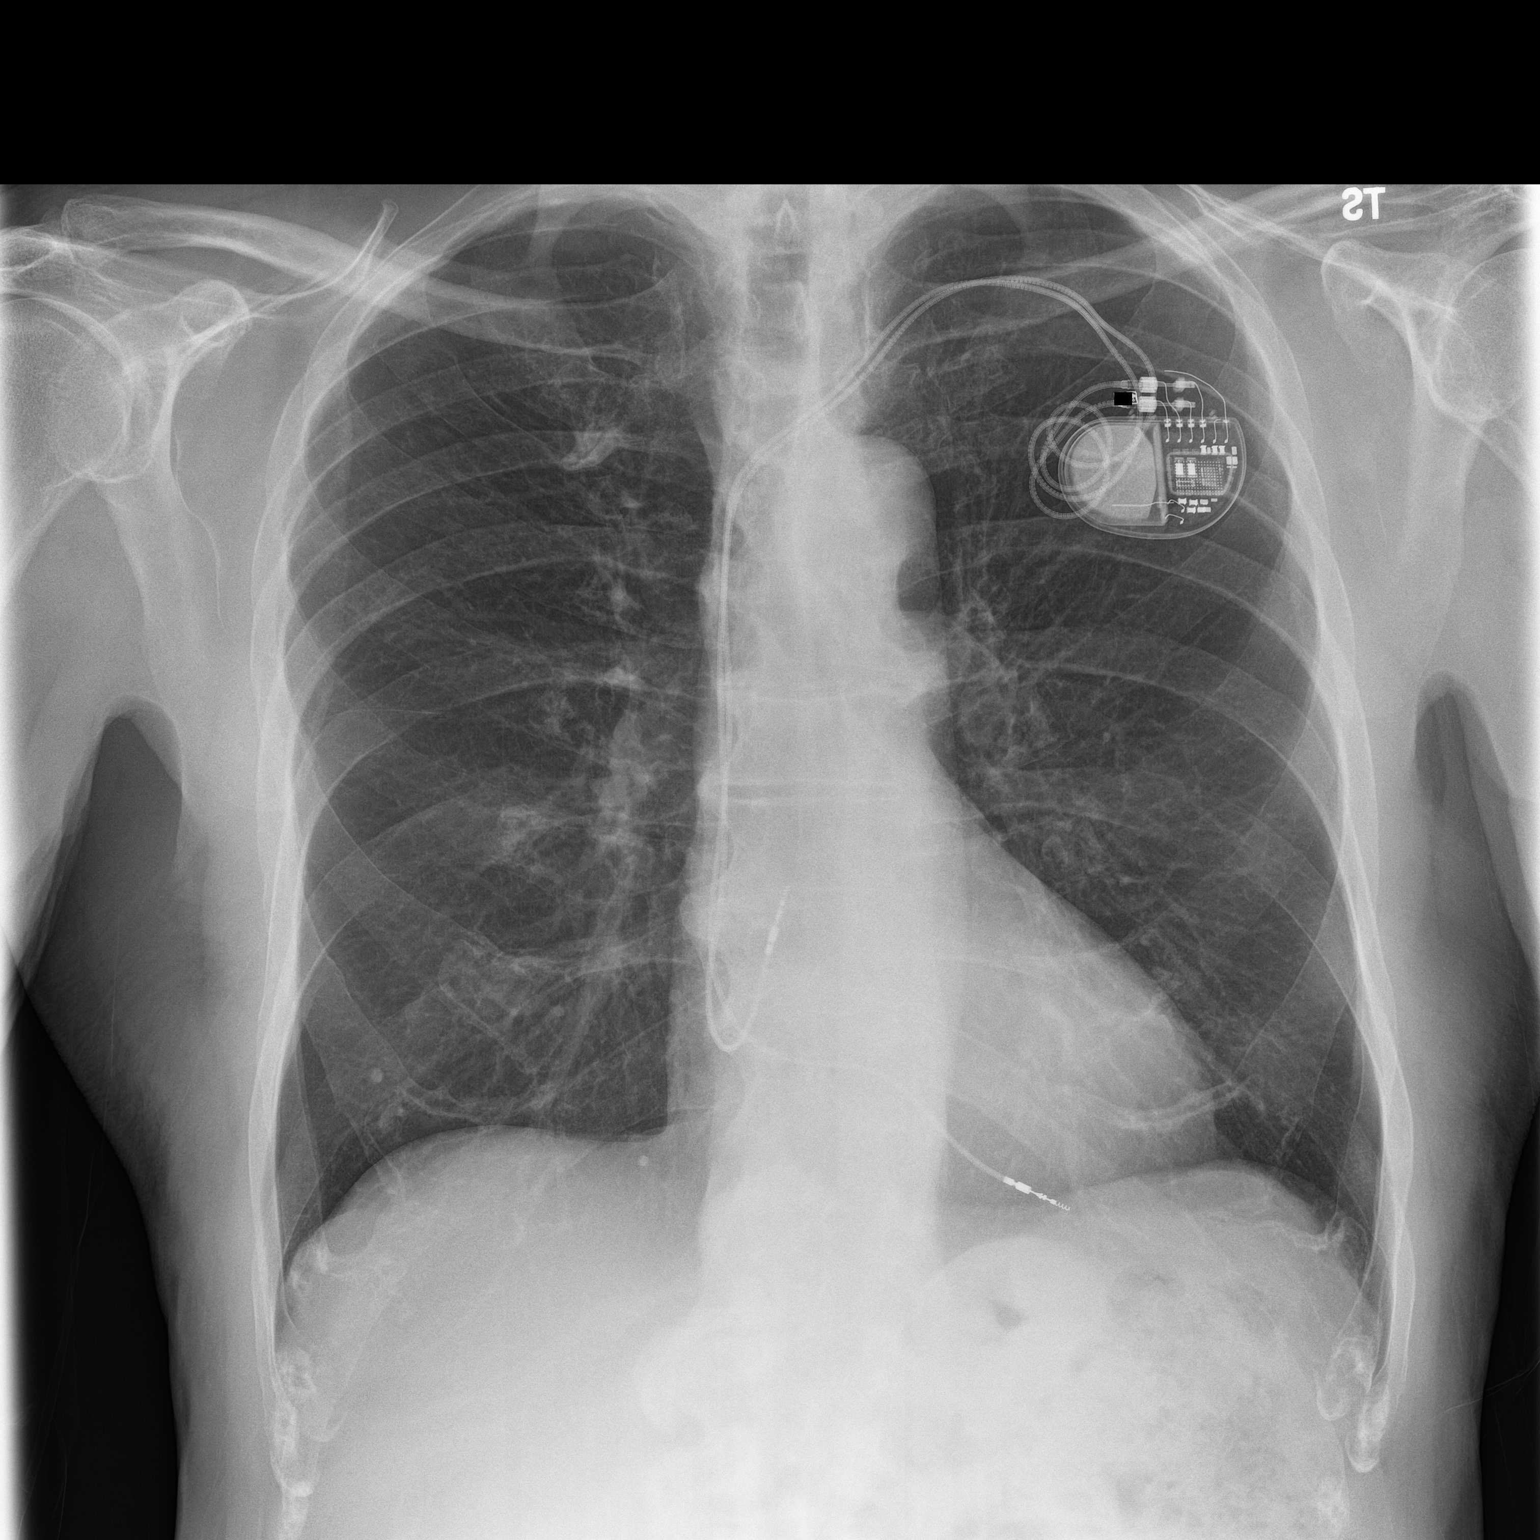
[im 2/2]
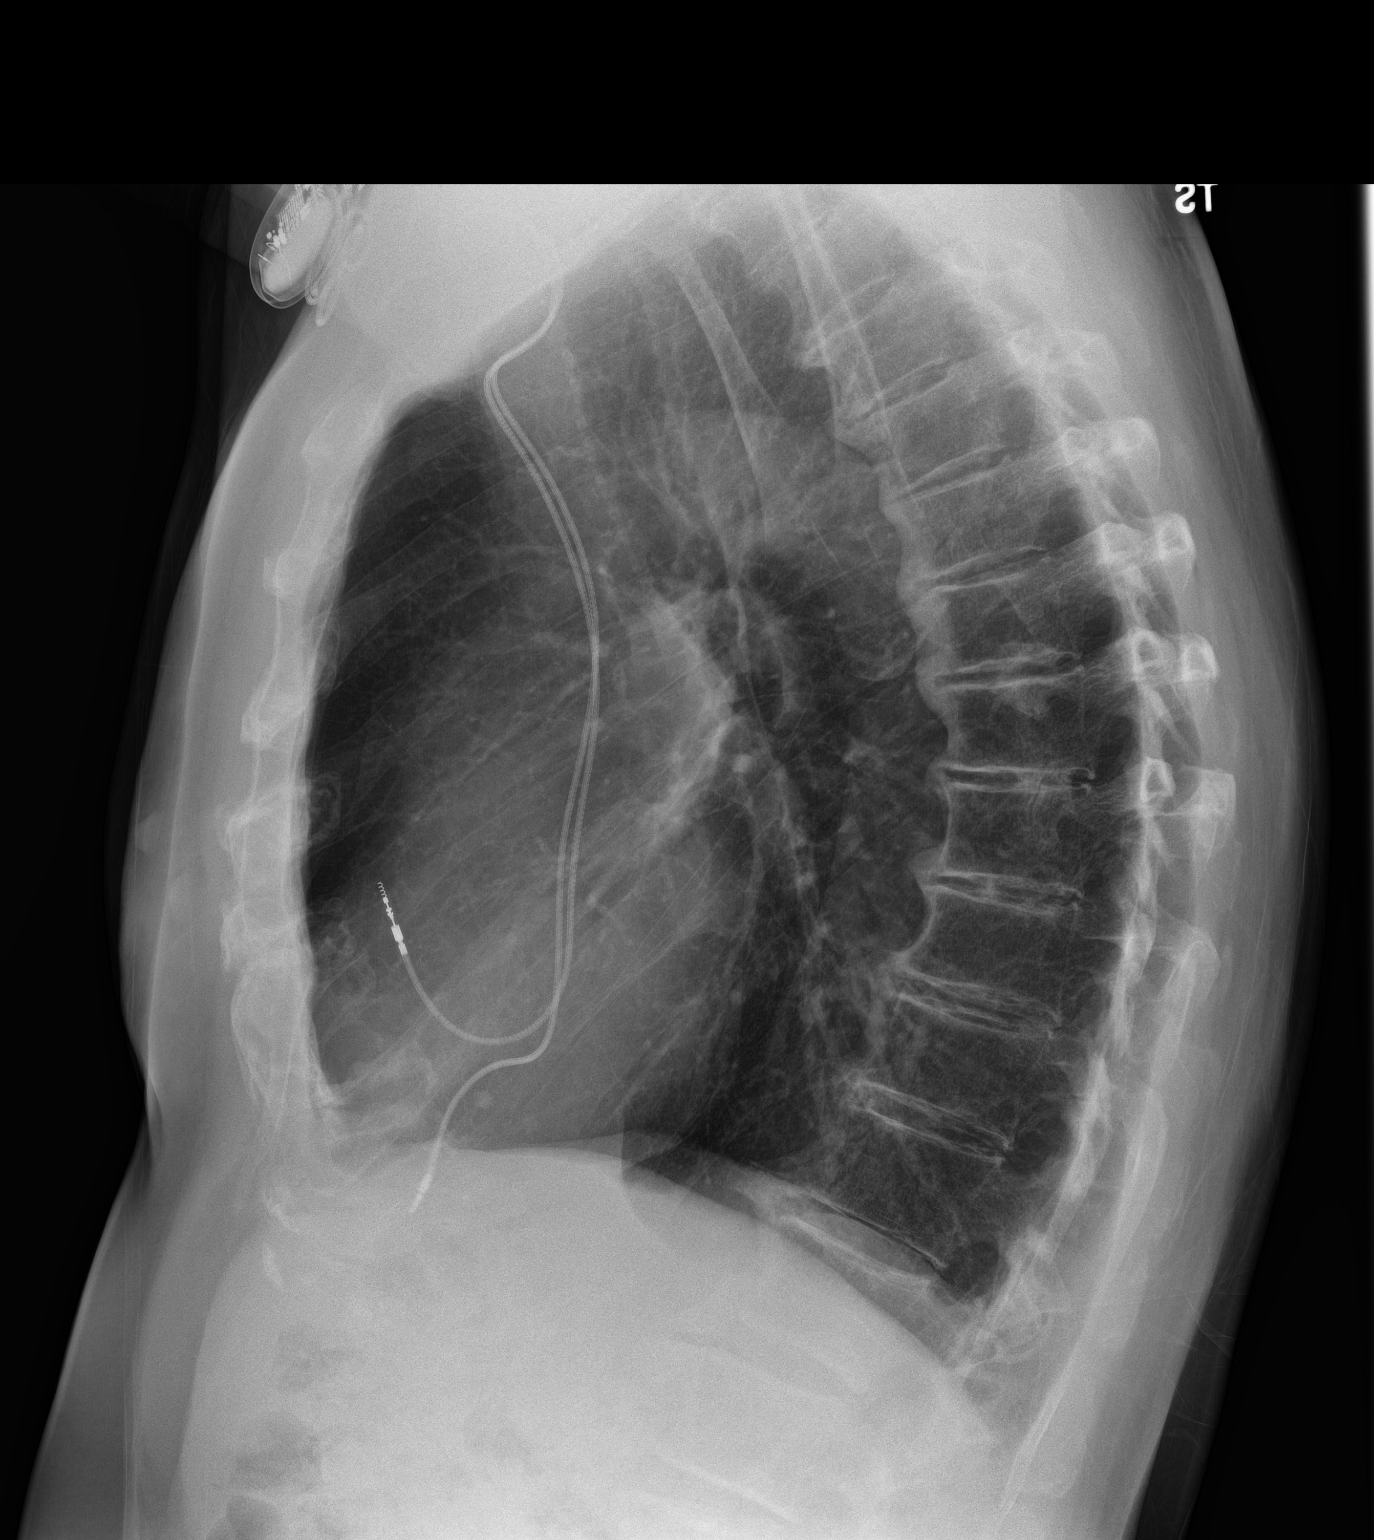

[2 of 2 positions shown; findings below may reference images not displayed]

FINDINGS: Pacing device is in place from a left subclavian approach. Generator
over the left upper chest is unchanged. Leads project in the right
atrium and right ventricle, also unchanged. Lungs are clear. Heart
size is normal. No pneumothorax or pleural fluid. No acute bony
abnormality. Thoracic spondylosis is unchanged in appearance.
IMPRESSION: No acute disease. Pacemaker in place without evidence of
complication.

## 2017-09-13 NOTE — Plan of Care (Signed)
  Problem: Clinical Measurements: Goal: Will remain free from infection 09/13/2017 0112 by Colonel Bald, RN Outcome: Progressing Note:  Receiving IV antibiotics for pneumonia. 09/13/2017 0109 by Colonel Bald, RN Outcome: Progressing Note:  Receiving IV antibiotics for pneumonia. Goal: Respiratory complications will improve 09/13/2017 0112 by Colonel Bald, RN Outcome: Progressing Note:  No acute respiratory complications.  Receiving PRN nebs. 09/13/2017 0109 by Colonel Bald, RN Note:  No acute respiratory complications.  Receiving PRN nebs. Goal: Cardiovascular complication will be avoided 09/13/2017 0112 by Colonel Bald, RN Outcome: Progressing Note:  No s/s of cardiovascular complication. 09/13/2017 0109 by Colonel Bald, RN Outcome: Progressing Note:  No s/s of cardiovascular complication.

## 2017-09-13 NOTE — Progress Notes (Signed)
EP attending  Progress Note  Patient Name: Thomas [name] Date of Encounter: 09/13/2017  Primary Cardiologist: Dr. Rockey Situ Electrophysiologist: Dr. Caryl Comes  Subjective   Holding sinus   Still congested but maybe some better   Inpatient Medications    Scheduled Meds: . amLODipine  5 mg Oral Daily  . apixaban  2.5 mg Oral BID  . dofetilide  125 mcg Oral BID  . febuxostat  20 mg Oral Daily  . fluticasone furoate-vilanterol  1 puff Inhalation Daily  . metoprolol succinate  50 mg Oral QHS  . sodium chloride flush  3 mL Intravenous Q12H  . sodium chloride flush  3 mL Intravenous Q12H  . terazosin  1 mg Oral QHS   Continuous Infusions: . sodium chloride    . sodium chloride 1 mL/hr at 09/11/17 1156  . cefTAZidime (FORTAZ)  IV Stopped (09/13/17 0725)  . vancomycin Stopped (09/12/17 1840)   PRN Meds: sodium chloride, albuterol, hydrALAZINE, hydrocortisone cream, sodium chloride flush, sodium chloride flush, traMADol   Vital Signs    Vitals:   09/12/17 2103 09/12/17 2147 09/13/17 0638 09/13/17 0825  BP:  132/75 134/67   Pulse:  71 72   Resp:  20 20   Temp:  98.7 F (37.1 C) 98.9 F (37.2 C)   TempSrc:  Oral Oral   SpO2: 94% 96% 97% 96%  Weight:   165 lb 12.8 oz (75.2 kg)   Height:        Intake/Output Summary (Last 24 hours) at 09/13/2017 0835 Last data filed at 09/13/2017 0655 Gross per 24 hour  Intake 1443 ml  Output -  Net 1443 ml   Filed Weights   09/11/17 1153 09/12/17 0633 09/13/17 0638  Weight: 167 lb (75.8 kg) 165 lb 11.2 oz (75.2 kg) 165 lb 12.8 oz (75.2 kg)    Telemetry     av pacing Personally Reviewed  ECG    Personally reviewed  AV pacing with QTc < 550 ( with Vpacin)  Physical Exam   Well developed and nourished in no acute distress HENT normal Neck supple with JVP-flat Clear Regular rate and rhythm, no murmurs or gallops Abd-soft with active BS No Clubbing cyanosis edema Skin-warm and dry A & Oriented  Grossly normal sensory and motor  function   Labs    Chemistry Recent Labs  Lab 09/10/17 0524 09/11/17 0516 09/12/17 0523  NA 133* 136 135  K 4.4 4.2 4.2  CL 99* 103 102  CO2 23 26 26   GLUCOSE 212* 134* 86  BUN 17 23* 23*  CREATININE 1.68* 1.35* 1.47*  CALCIUM 8.7* 8.7* 8.5*  GFRNONAA 36* 47* 43*  GFRAA 42* 55* 49*  ANIONGAP 11 7 7      Hematology Recent Labs  Lab 09/11/17 1620  WBC 9.1  RBC 5.34  HGB 16.0  HCT 47.4  MCV 88.8  MCH 30.0  MCHC 33.8  RDW 14.3  PLT 168    Cardiac EnzymesNo results for input(s): TROPONINI in the last 168 hours. No results for input(s): TROPIPOC in the last 168 hours.   BNPNo results for input(s): BNP, PROBNP in the last 168 hours.   DDimer No results for input(s): DDIMER in the last 168 hours.   Radiology    Ct Coronary Morph W/cta Cor W/score W/ca W/cm &/or Wo/cm Addendum Date: 09/10/2017   ADDENDUM REPORT: 09/10/2017 13:56 CLINICAL DATA:  Chest pain EXAM: Cardiac CTA MEDICATIONS: Sub lingual nitro.  4mg  x 2 and lopressor 5mg  IV TECHNIQUE: The patient was  scanned on a Siemens 161 slice scanner. Gantry rotation speed was 250 msecs. Collimation was 0.6 mm. A 100 kV prospective scan was triggered in the ascending thoracic aorta at 35-75% of the R-R interval. Average HR during the scan was 60 bpm. The 3D data set was interpreted on a dedicated work station using MPR, MIP and VRT modes. A total of 80cc of contrast was used. FINDINGS: Non-cardiac: See separate report from [facility]. Pacemaker wire in heart. Calcium Score: 1366 Agatston units. Coronary Arteries: Right dominant with no anomalies LM: Short left main, calcified plaque near the ostium with mild stenosis. LAD system: Extensive mixed plaque in the proximal to mid LAD. There is an area in the mid LAD after the take-off of a small D2 that is concerning for severe stenosis. Circumflex system: Small caliber ramus with extensive calcified plaque. Cannot rule out moderate stenosis. Calcified plaque proximal LCx,  mild stenosis. Calcified plaque mid LCx, around 50% stenosis. Calcified plaque in moderate PLOM, blooming artifact present, cannot rule out moderate stenosis but most likely mild. RCA system: Uninterpretable. The RCA is obscured by artifact from pacemaker. IMPRESSION: 1. Coronary artery calcium score 1366 Agatston units places the patient at the 74th percentile for age and gender. This suggests intermediate risk for future cardiac events. 2.  The RCA is uninterpretable due to pacemaker artifact. 3.  Up to 50% stenosis in the mid LCx. 4.  I am concerned for severe stenosis in the mid LAD after D2. I will send for FFR of the LAD, but study may be rejected because of the uninterpretable RCA. Dalton Mclean Electronically Signed   By: Loralie Champagne M.D.   On: 09/10/2017 13:56   Result Date: 09/10/2017 EXAM: OVER-READ INTERPRETATION  CT CHEST The following report is an over-read performed by radiologist Dr. Vinnie Langton of [facility], [place] on 09/10/2017. This over-read does not include interpretation of cardiac or coronary anatomy or pathology. The coronary calcium score/coronary CTA interpretation by the cardiologist is attached. COMPARISON:  None. FINDINGS: Aortic atherosclerosis. Patchy multifocal peribronchovascular predominant ground-glass attenuation and micronodularity noted throughout the left lung, concerning for multilobar bronchopneumonia. No pleural effusions. No other suspicious appearing pulmonary nodules or masses are noted elsewhere in the visualized portions of the thorax. No lymphadenopathy. Visualized portions of the upper abdomen are unremarkable in appearance. There are no aggressive appearing lytic or blastic lesions noted in the visualized portions of the skeleton. IMPRESSION: 1. Findings are concerning for multilobar bronchopneumonia in the left lung. 2.  Aortic Atherosclerosis (ICD10-I70.0). Electronically Signed: By: Vinnie Langton M.D. On: 09/10/2017 11:46    Cardiac Studies    08/13/17: TTE Study Conclusions - Left ventricle: The cavity size was normal. Systolic function was normal. The estimated ejection fraction was in the range of 50% to 55%. Anteroseptal wall motion abnormality consistent with paced rhythm. The study is not technically sufficient to allow evaluation of LV diastolic function. - Aortic valve: There was trivial regurgitation. Mean gradient (S): 5 mm Hg. - Mitral valve: There was mild to moderate regurgitation. - Left atrium: The atrium was moderately dilated. - Right ventricle: The cavity size was mildly dilated. Wall thickness was normal. Systolic function was mildly reduced. - Pulmonary arteries: Systolic pressure was mildly elevated. PA peak pressure: 41 mm Hg (S).   Patient Profile     84 y.o. male  CHB w/PPM, PAFib/flutter, arthritis, HTN, HLD, renal ca s/p cryotherapy, CVA/TIA, chronic Lyme disease, some degree of CAD described as peripheral, admitted for Tikosyn load, planned for  cardiac/coronary CT w/FFR  Device information MDT dual chamber PPM, implanted 06/19/16  Assessment & Plan    AFib persistent  Pneumonia    HTN  Pacemaker  Renal Insuffciency    Holding sinus on dofetilide-- continue  ABx IV x 24 hrs then switch to PO  Will need input as to ABx of choice  Anticipate home on Monday  On Anticoagulation;  No bleeding issues

## 2017-09-14 ENCOUNTER — Other Ambulatory Visit: Payer: Self-pay

## 2017-09-14 MED ORDER — ALBUTEROL SULFATE (2.5 MG/3ML) 0.083% IN NEBU
2.5000 mg | INHALATION_SOLUTION | Freq: Three times a day (TID) | RESPIRATORY_TRACT | Status: DC
Start: 2017-09-14 — End: 2017-09-15
  Administered 2017-09-14 – 2017-09-15 (×2): 2.5 mg via RESPIRATORY_TRACT
  Filled 2017-09-14 (×2): qty 3

## 2017-09-14 NOTE — Progress Notes (Signed)
Pharmacy Antibiotic Note  Thomas [name] is a 84 y.o. male admitted on 09/09/2017 with Tikosyn initation, found to have multilobar pneumonia on CT.  Pharmacy has been consulted for vancomycin and ceftazidime dosing. Of note patient has documented penicillin allergy (itchiness) but has tolerated cephalosporins.  Today is Day 4 of broad spectrum antibiotics for PNA. Patient appears to be improving overall - afebrile, wbc is normal, PCT < 0.10, denies dyspnea, CXR yesterday revealed slight improvement, renal function is stable. Non-expectorated sputum culture is growing moderate GPC in clusters and chains.   Per EP/cards plan to discharge 5/13 on PO antibiotics.   Plan: Continue vancomycin 1000 mg IV q24 hrs (goal trough 15-20) Continue ceftazidime 1 gram IV q 12 hrs Monitor renal function, clinical status Follow up change to PO antibiotics - recommend avoiding fluoroquinolones due to patient age, cardiac issues   Height: 5\' 7"  (170.2 cm) Weight: 159 lb (72.1 kg) IBW/kg (Calculated) : 66.1  Temp (24hrs), Avg:97.9 F (36.6 C), Min:97.7 F (36.5 C), Max:98.1 F (36.7 C)  Recent Labs  Lab 09/09/17 1058 09/10/17 0524 09/11/17 0516 09/11/17 1620 09/12/17 0523  WBC  --   --   --  9.1  --   CREATININE 1.45* 1.68* 1.35*  --  1.47*    Estimated Creatinine Clearance: 36.2 mL/min (A) (by C-G formula based on SCr of 1.47 mg/dL (H)).    Allergies  Allergen Reactions  . Iodine Hives and Other (See Comments)    IVP contrast  . Penicillins Itching, Rash and Other (See Comments)    ITCHY FEELING IN FINGERS Has patient had a PCN reaction causing immediate rash, facial/tongue/throat swelling, SOB or lightheadedness with hypotension: No Has patient had a PCN reaction causing severe rash involving mucus membranes or skin necrosis: No Has patient had a PCN reaction that required hospitalization No Has patient had a PCN reaction occurring within the last 10 years: No If all of the above answers are  "NO", then may proceed with Cephalosporin use.     Antimicrobials this admission: Vanc 5/9 >> [name] 5/9 >>  Microbiology results: 5/9 BCx - ng x 3days 5/9 sputum cx - recollect 5/11 sputum - mod GPC clusters and chains - reincubated  Thank you for allowing pharmacy to be a part of this patient's care.  Charlene Brooke, PharmD PGY1 Pharmacy Resident Phone: [phone] After 3:30PM please call [place] [phone] 09/14/2017 11:10 AM

## 2017-09-14 NOTE — Progress Notes (Signed)
Subjective:  Denies SSCP, palpitations or Dyspnea   Objective:  Vitals:   09/13/17 2212 09/14/17 0523 09/14/17 0524 09/14/17 0754  BP: 139/90 (!) 151/91    Pulse: 71 72  74  Resp:    20  Temp: 98.1 F (36.7 C)     TempSrc: Oral     SpO2: 92% 92%  96%  Weight:   159 lb (72.1 kg)   Height:        Intake/Output from previous day:  Intake/Output Summary (Last 24 hours) at 09/14/2017 0810 Last data filed at 09/13/2017 2211 Gross per 24 hour  Intake 123 ml  Output -  Net 123 ml    Physical Exam: Affect appropriate Healthy:  appears stated age HEENT: normal Neck supple with no adenopathy JVP normal no bruits no thyromegaly Lungs clear with no wheezing and good diaphragmatic motion Heart:  S1/S2 no murmur, no rub, gallop or click PMI normal pacer under left clavicle  Abdomen: benighn, BS positve, no tenderness, no AAA no bruit.  No HSM or HJR Distal pulses intact with no bruits No edema Neuro non-focal Skin warm and dry No muscular weakness   Lab Results: Basic Metabolic Panel: Recent Labs    09/12/17 0523  NA 135  K 4.2  CL 102  CO2 26  GLUCOSE 86  BUN 23*  CREATININE 1.47*  CALCIUM 8.5*  MG 2.1   Liver Function Tests: No results for input(s): AST, ALT, ALKPHOS, BILITOT, PROT, ALBUMIN in the last 72 hours. No results for input(s): LIPASE, AMYLASE in the last 72 hours. CBC: Recent Labs    09/11/17 1620  WBC 9.1  HGB 16.0  HCT 47.4  MCV 88.8  PLT 168    Imaging: Dg Chest Port 1 View  Result Date: 09/13/2017 CLINICAL DATA:  Pneumonia. EXAM: PORTABLE CHEST 1 VIEW COMPARISON:  Sep 12, 2017 FINDINGS: Stable pacemaker. The heart, hila, and mediastinum are normal. Nipple shadow over the lateral left chest. Persistent but decreasing perihilar opacity on the left. No other interval changes. IMPRESSION: Improving but persistent left-sided pneumonia. Recommend follow-up to complete resolution. Electronically Signed   By: Dorise Bullion III M.D   On:  09/13/2017 09:22    Cardiac Studies:  ECG:  AV dual chamber pacing QRS 186    Telemetry: AV pacing rare PVC;s   Echo: 08/2017 EF 50-55%   Medications:   . amLODipine  5 mg Oral Daily  . apixaban  2.5 mg Oral BID  . dofetilide  125 mcg Oral BID  . febuxostat  20 mg Oral Daily  . fluticasone furoate-vilanterol  1 puff Inhalation Daily  . metoprolol succinate  50 mg Oral QHS  . sodium chloride flush  3 mL Intravenous Q12H  . sodium chloride flush  3 mL Intravenous Q12H  . terazosin  1 mg Oral QHS     . sodium chloride    . sodium chloride 1 mL/hr at 09/11/17 1156  . cefTAZidime (FORTAZ)  IV Stopped (09/14/17 0747)  . vancomycin 200 mL/hr at 09/13/17 1904    Assessment/Plan:   PaF :  For tikosyn load no ECG checked yesterday although pace will order for this am. Sinus with A pacing this am Plan per SK d/c in am if maintaining NSR Continue eliquis and beta blocker  Pneumonia:  Left sided CXR yesterday slight improvement on [name] will need discussion about course of PO antibiotics On d/c in am   Will order ECG for this am    Thomas [name]  Thomas [name] 09/14/2017, 8:10 AM

## 2017-09-15 ENCOUNTER — Ambulatory Visit (HOSPITAL_COMMUNITY): Payer: Medicare Other | Admitting: Nurse Practitioner

## 2017-09-15 DIAGNOSIS — I5033 Acute on chronic diastolic (congestive) heart failure: Secondary | ICD-10-CM

## 2017-09-15 LAB — CULTURE, RESPIRATORY W GRAM STAIN
Culture: NORMAL
Special Requests: NORMAL

## 2017-09-15 LAB — CULTURE, RESPIRATORY

## 2017-09-15 MED ORDER — DOXYCYCLINE HYCLATE 100 MG PO TABS: 100.0000 mg | ORAL_TABLET | Freq: Two times a day (BID) | ORAL | 0 refills | Status: DC

## 2017-09-15 MED ORDER — AMLODIPINE BESYLATE 5 MG PO TABS: 5.0000 mg | ORAL_TABLET | Freq: Every day | ORAL | 4 refills | Status: DC

## 2017-09-15 MED ORDER — DOFETILIDE 125 MCG PO CAPS: 125.0000 ug | ORAL_CAPSULE | Freq: Two times a day (BID) | ORAL | 1 refills | Status: DC

## 2017-09-15 NOTE — Discharge Summary (Addendum)
ELECTROPHYSIOLOGY PROCEDURE DISCHARGE SUMMARY    Patient ID: VANCE HOCHMUTH,  MRN: [id number], DOB/AGE: June 18, 1932 84 y.o.  Admit date: 09/09/2017 Discharge date: 09/15/2017  Primary Care Physician: Jerrol Banana., MD Primary Cardiologist: Rockey Situ Electrophysiologist: Caryl Comes  Primary Discharge Diagnosis:  1.  Persistent atrial fibrillation status post Tikosyn loading this admission 2.  Healthcare acquired pneumonia  Secondary Discharge Diagnosis:  1.  Complete heart block s/p PPM 2.  Prior CVA/TIA 3.  Chronic lyme disease 4.  CAD 5.  HTN 6.  Hyperlipidemia 7.  Renal cancer s/p cryotherapy  Allergies  Allergen Reactions  . Iodine Hives and Other (See Comments)    IVP contrast  . Penicillins Itching, Rash and Other (See Comments)    ITCHY FEELING IN FINGERS Has patient had a PCN reaction causing immediate rash, facial/tongue/throat swelling, SOB or lightheadedness with hypotension: No Has patient had a PCN reaction causing severe rash involving mucus membranes or skin necrosis: No Has patient had a PCN reaction that required hospitalization No Has patient had a PCN reaction occurring within the last 10 years: No If all of the above answers are "NO", then may proceed with Cephalosporin use.      Procedures This Admission:  1.  Tikosyn loading 2.  Direct current cardioversion on 09/11/17 by Dr Stanford Breed which successfully restored SR.  There were no early apparent complications.   Brief HPI: ANTHEM FRAZER is a 84 y.o. male with a past medical history as noted above.  They were referred to EP in the outpatient setting for treatment options of atrial fibrillation.  Risks, benefits, and alternatives to Tikosyn were reviewed with the patient who wished to proceed.    The patient was seen and is feeling somewhat better in sinus rhythm and on antibiotics tolerating Tikosyn.  Improved with nebs.  Hospital Course:  The patient was admitted and Tikosyn was initiated.  Renal  function and electrolytes were followed during the hospitalization.  Their QTc remained stable.  On 09/11/17 they underwent direct current cardioversion which restored sinus rhythm.  They were monitored until discharge on telemetry which demonstrated AV pacing. During admission, CT was concerning for pneumonia.  Pulmonary was consulted and antibiotics were initiated. He remained afebrile.  On the day of discharge, they were examined by Dr Caryl Comes who considered them stable for discharge to home.  Follow-up has been arranged with AF clinic in 1 week and with Dr Caryl Comes in 4 weeks.  Per pulmonary recommendations, plan to use Doxycycline 100mg  twice daily for 10 days. Will leave on current inhalers.  He has follow up with pulmonary scheduled in early July.    feel Physical Exam: Vitals:   09/14/17 2009 09/14/17 2132 09/15/17 0500 09/15/17 0727  BP:  120/81 (!) 150/93   Pulse:  70 68   Resp:   13   Temp:  97.9 F (36.6 C) 97.7 F (36.5 C)   TempSrc:  Oral Oral   SpO2: 98% 94% 97% 92%  Weight:   159 lb 6.4 oz (72.3 kg)   Height:      Well developed and nourished in no acute distress HENT normal Neck supple with JVP-flat Clear Regular rate and rhythm, no murmurs or gallops Abd-soft with active BS No Clubbing cyanosis edema Skin-warm and dry A & Oriented  Grossly normal sensory and motor function   Labs:   Lab Results  Component Value Date   WBC 9.1 09/11/2017   HGB 16.0 09/11/2017   HCT 47.4 09/11/2017  MCV 88.8 09/11/2017   PLT 168 09/11/2017    Recent Labs  Lab 09/12/17 0523  NA 135  K 4.2  CL 102  CO2 26  BUN 23*  CREATININE 1.47*  CALCIUM 8.5*  GLUCOSE 86     Discharge Medications:  Allergies as of 09/15/2017      Reactions   Iodine Hives, Other (See Comments)   IVP contrast   Penicillins Itching, Rash, Other (See Comments)   ITCHY FEELING IN FINGERS Has patient had a PCN reaction causing immediate rash, facial/tongue/throat swelling, SOB or lightheadedness with  hypotension: No Has patient had a PCN reaction causing severe rash involving mucus membranes or skin necrosis: No Has patient had a PCN reaction that required hospitalization No Has patient had a PCN reaction occurring within the last 10 years: No If all of the above answers are "NO", then may proceed with Cephalosporin use.      Medication List    STOP taking these medications   predniSONE 50 MG tablet Commonly known as:  DELTASONE     TAKE these medications   Albuterol Sulfate 108 (90 Base) MCG/ACT Aepb Inhale 1-2 puffs into the lungs every 4 (four) hours as needed. What changed:  reasons to take this   amLODipine 5 MG tablet Commonly known as:  NORVASC Take 1 tablet (5 mg total) by mouth daily.   atorvastatin 40 MG tablet Commonly known as:  LIPITOR TAKE 1 TABLET BY MOUTH EVERY DAY AT 6PM   colchicine 0.6 MG tablet Commonly known as:  COLCRYS Take 1 tablet (0.6 mg total) by mouth as needed. What changed:    when to take this  reasons to take this   dofetilide 125 MCG capsule Commonly known as:  TIKOSYN Take 1 capsule (125 mcg total) by mouth 2 (two) times daily.   doxycycline 100 MG tablet Commonly known as:  VIBRA-TABS Take 1 tablet (100 mg total) by mouth 2 (two) times daily.   dutasteride 0.5 MG capsule Commonly known as:  AVODART Take 0.5 mg by mouth daily as needed (for urination).   ELIQUIS 2.5 MG Tabs tablet Generic drug:  apixaban Take 2.5 mg by mouth 2 (two) times daily.   esomeprazole 40 MG capsule Commonly known as:  NEXIUM Take 40 mg by mouth daily as needed (for heartburn).   febuxostat 40 MG tablet Commonly known as:  ULORIC Take 20 mg by mouth daily.   fluticasone furoate-vilanterol 100-25 MCG/INH Aepb Commonly known as:  BREO ELLIPTA Inhale 1 puff into the lungs daily.   hydrALAZINE 25 MG tablet Commonly known as:  APRESOLINE Take 1 tablet (25 mg total) by mouth 3 (three) times daily as needed. What changed:  reasons to take this    metoprolol succinate 50 MG 24 hr tablet Commonly known as:  TOPROL-XL Take 1 tablet (50 mg total) by mouth daily. Take with or immediately following a meal. What changed:    when to take this  additional instructions   metroNIDAZOLE 500 MG tablet Commonly known as:  FLAGYL Take 500 mg by mouth See admin instructions. Take 500 mg by mouth twice daily on Sunday and Monday   propranolol 40 MG tablet Commonly known as:  INDERAL TAKE ONE (1) TABLET THREE (3) TIMES EACHDAY AS NEEDED   RAPAFLO 8 MG Caps capsule Generic drug:  silodosin Take 8 mg by mouth daily as needed (for urintation).   terazosin 1 MG capsule Commonly known as:  HYTRIN Take 1 mg by mouth at bedtime.  testosterone cypionate 200 MG/ML injection Commonly known as:  DEPOTESTOSTERONE CYPIONATE Inject 1 mL (200 mg total) every 28 (twenty-eight) days into the muscle.   traMADol 50 MG tablet Commonly known as:  ULTRAM Take 50 mg by mouth every 6 (six) hours as needed for moderate pain.   VOLTAREN 1 % Gel Generic drug:  diclofenac sodium Apply 2 g topically daily as needed (for pain).       Disposition:  Discharge Instructions    Diet - low sodium heart healthy   Complete by:  As directed    Increase activity slowly   Complete by:  As directed      Follow-up Information    Deboraha Sprang, MD Follow up on 10/15/2017.   Specialty:  Cardiology Why:  3:15PM Contact information: [address] [address] [phone]        [facility] Follow up on 09/22/2017.   Specialty:  Cardiology Why:  10:00AM Contact information: [address] [id number] mc [address] [place] [address] [phone]       Laverle Hobby, MD Follow up on 11/04/2017.   Specialty:  Pulmonary Disease Why:  at 10AM Contact information: [place] [address] [phone]           Duration of Discharge Encounter:  Greater than 30 minutes including physician time.  Signed, Chanetta Marshall, NP 09/15/2017 8:05 AM  Patient seen and examined as noted above.  Also spoke with Dr. Halford Chessman for recommendations regarding antibiotics.

## 2017-09-15 NOTE — Care Management Important Message (Signed)
Important Message  Patient Details  Name: Thomas [name] MRN: [id number] Date of Birth: July 10, 1932   Medicare Important Message Given:  Yes    Thomas [name] P Thomas [name] 09/15/2017, 2:46 PM

## 2017-09-16 LAB — CULTURE, BLOOD (ROUTINE X 2)
CULTURE: NO GROWTH
CULTURE: NO GROWTH
SPECIAL REQUESTS: ADEQUATE
Special Requests: ADEQUATE

## 2017-09-17 ENCOUNTER — Ambulatory Visit (HOSPITAL_COMMUNITY): Payer: Medicare Other | Admitting: Nurse Practitioner

## 2017-09-22 ENCOUNTER — Encounter (HOSPITAL_COMMUNITY): Payer: Self-pay | Admitting: Nurse Practitioner

## 2017-09-22 ENCOUNTER — Ambulatory Visit (HOSPITAL_COMMUNITY)
Admission: RE | Admit: 2017-09-22 | Discharge: 2017-09-22 | Disposition: A | Discharge status: Home / Self Care | Payer: Medicare Other | Source: Ambulatory Visit | Attending: Nurse Practitioner | Admitting: Nurse Practitioner

## 2017-09-22 VITALS — BP 132/84 | HR 70 | Ht 67.0 in | Wt 164.0 lb

## 2017-09-22 DIAGNOSIS — Z79899 Other long term (current) drug therapy: Secondary | ICD-10-CM | POA: Diagnosis not present

## 2017-09-22 DIAGNOSIS — E785 Hyperlipidemia, unspecified: Secondary | ICD-10-CM | POA: Insufficient documentation

## 2017-09-22 DIAGNOSIS — Z85828 Personal history of other malignant neoplasm of skin: Secondary | ICD-10-CM | POA: Diagnosis not present

## 2017-09-22 DIAGNOSIS — I4892 Unspecified atrial flutter: Secondary | ICD-10-CM | POA: Insufficient documentation

## 2017-09-22 DIAGNOSIS — Z880 Allergy status to penicillin: Secondary | ICD-10-CM | POA: Insufficient documentation

## 2017-09-22 DIAGNOSIS — I442 Atrioventricular block, complete: Secondary | ICD-10-CM | POA: Diagnosis not present

## 2017-09-22 DIAGNOSIS — I10 Essential (primary) hypertension: Secondary | ICD-10-CM | POA: Insufficient documentation

## 2017-09-22 DIAGNOSIS — Z950 Presence of cardiac pacemaker: Secondary | ICD-10-CM | POA: Insufficient documentation

## 2017-09-22 DIAGNOSIS — Z8673 Personal history of transient ischemic attack (TIA), and cerebral infarction without residual deficits: Secondary | ICD-10-CM | POA: Insufficient documentation

## 2017-09-22 DIAGNOSIS — M109 Gout, unspecified: Secondary | ICD-10-CM | POA: Insufficient documentation

## 2017-09-22 DIAGNOSIS — Z888 Allergy status to other drugs, medicaments and biological substances status: Secondary | ICD-10-CM | POA: Insufficient documentation

## 2017-09-22 DIAGNOSIS — I480 Paroxysmal atrial fibrillation: Secondary | ICD-10-CM | POA: Insufficient documentation

## 2017-09-22 DIAGNOSIS — K219 Gastro-esophageal reflux disease without esophagitis: Secondary | ICD-10-CM | POA: Diagnosis not present

## 2017-09-22 DIAGNOSIS — Z85528 Personal history of other malignant neoplasm of kidney: Secondary | ICD-10-CM | POA: Diagnosis not present

## 2017-09-22 DIAGNOSIS — Z7901 Long term (current) use of anticoagulants: Secondary | ICD-10-CM | POA: Diagnosis not present

## 2017-09-22 DIAGNOSIS — I4891 Unspecified atrial fibrillation: Secondary | ICD-10-CM | POA: Diagnosis present

## 2017-09-22 LAB — BASIC METABOLIC PANEL
Anion gap: 10 (ref 5–15)
BUN: 19 mg/dL (ref 6–20)
CHLORIDE: 96 mmol/L — AB (ref 101–111)
CO2: 29 mmol/L (ref 22–32)
CREATININE: 1.47 mg/dL — AB (ref 0.61–1.24)
Calcium: 9.6 mg/dL (ref 8.9–10.3)
GFR, EST AFRICAN AMERICAN: 49 mL/min — AB (ref >60)
GFR, EST NON AFRICAN AMERICAN: 43 mL/min — AB (ref >60)
Glucose, Bld: 86 mg/dL (ref 65–99)
POTASSIUM: 4.3 mmol/L (ref 3.5–5.1)
SODIUM: 135 mmol/L (ref 135–145)

## 2017-09-22 LAB — MAGNESIUM: MAGNESIUM: 2 mg/dL (ref 1.7–2.4)

## 2017-09-22 NOTE — Progress Notes (Signed)
Primary Care Physician: Jerrol Banana., MD Referring Physician: Dr. Vonita Moss Thomas [name] is a 84 y.o. 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  He is not sure if he is symptomatic with afib but Dr. Caryl Comes wants to see if getting him back in SR will improve his energy. He is scheduled to have a Cardiac CT in the hospital and will need prednisone prophylaxis  as he is allergic to iodine. Denies any benadryl use, has not missed any doses of eliquis.  F/u in afib clinic, he was discharged on Tikosyn 125 mcg bid. While he was in hospital, he was found to have Pneumonia and was kept longer for antibiotics.  Still  with some cough but improved. He also had coronary CT and will review results  with Dr. Caryl Comes on f/u. He is feeling better with less shortness of breath. He is in AV paced rhythm today. qtc stayed stable in hospital.  Today, he denies symptoms of palpitations, chest pain, shortness of breath, orthopnea, PND, lower extremity edema, dizziness, presyncope, syncope, or neurologic sequela. The patient is tolerating medications without difficulties and is otherwise without complaint today.   Past Medical History:  Diagnosis Date  . Aortic valve disorders   . Arthritis   . Atrial flutter ([place]) 06/18/2016   "AF or AFl; not sure which" (06/23/2016)  . Basal cell carcinoma    "face, nose left shoulder, left arm" (06/19/2016)  . BBB (bundle branch block)    hx right  . Chronic back pain    "neck, thoracic, lower back" (06/19/2016)  . Complete heart block ([place]) 06/2016  . Dyspnea   . GERD (gastroesophageal reflux disease)   . Heart block    "I've had type I, II [place] before now" (06/19/2016)  . History of gout   . History of hiatal hernia    "self dx'd" (06/19/2016)  . Hyperlipidemia   . Hypertension   . Lyme disease    "dx'd by me 2003; cx's showed dx 08/2015"  . Migraine    "3-4/year" (06/19/2016)  .  Presence of permanent cardiac pacemaker 06/19/2016  . PVC's (premature ventricular contractions)   . Renal cancer, left ([place]) 2006   S/P cryotherapy  . Spinal stenosis    "cervical, 1 thoracic, lumbar" (06/19/2016)  . Squamous carcinoma    "face, nose left shoulder, left arm" (06/19/2016)  . Stroke ([place])   . TIA (transient ischemic attack) 06/14/2016   "I'm not sure that's what it was" (06/25/2016)  . Visit for monitoring Tikosyn therapy 09/09/2017   Past Surgical History:  Procedure Laterality Date  . ANKLE FRACTURE SURGERY Right 1967  . BASAL CELL CARCINOMA EXCISION     "face, nose left shoulder, left arm"  . BIOPSY PROSTATE  2001 & 2003  . CARDIAC CATHETERIZATION  1990's  . CARDIOVERSION N/A 09/11/2017   Procedure: CARDIOVERSION;  Surgeon: Lelon Perla, MD;  Location: [facility] ENDOSCOPY;  Service: Cardiovascular;  Laterality: N/A;  . FRACTURE SURGERY    . INGUINAL HERNIA REPAIR Left 2012  . INSERT / REPLACE / REMOVE PACEMAKER  06/19/2016  . LAPAROSCOPIC ABLATION RENAL MASS    . PACEMAKER IMPLANT N/A 06/19/2016   Procedure: Pacemaker Implant;  Surgeon: Deboraha Sprang, MD;  Location: [facility] LAB;  Service: Cardiovascular;  Laterality: N/A;  . pacemasker    . PROSTATE SURGERY    . SQUAMOUS CELL CARCINOMA EXCISION     "  face, nose left shoulder, left arm"  . TONSILLECTOMY AND ADENOIDECTOMY      Current Outpatient Medications  Medication Sig Dispense Refill  . Albuterol Sulfate 108 (90 Base) MCG/ACT AEPB Inhale 1-2 puffs into the lungs every 4 (four) hours as needed. (Patient taking differently: Inhale 1-2 puffs into the lungs every 4 (four) hours as needed (for shortness of breath or wheezing). ) 1 each 11  . amLODipine (NORVASC) 5 MG tablet Take 1 tablet (5 mg total) by mouth daily. 180 tablet 4  . apixaban (ELIQUIS) 2.5 MG TABS tablet Take 2.5 mg by mouth 2 (two) times daily.    Marland Kitchen atorvastatin (LIPITOR) 40 MG tablet TAKE 1 TABLET BY MOUTH EVERY DAY AT 6PM 90 tablet 3  .  colchicine (COLCRYS) 0.6 MG tablet Take 1 tablet (0.6 mg total) by mouth as needed. (Patient taking differently: Take 0.6 mg by mouth daily as needed (for gout). ) 30 tablet 0  . diclofenac sodium (VOLTAREN) 1 % GEL Apply 2 g topically daily as needed (for pain).     Marland Kitchen dofetilide (TIKOSYN) 125 MCG capsule Take 1 capsule (125 mcg total) by mouth 2 (two) times daily. 180 capsule 1  . doxycycline (VIBRA-TABS) 100 MG tablet Take 1 tablet (100 mg total) by mouth 2 (two) times daily. 20 tablet 0  . dutasteride (AVODART) 0.5 MG capsule Take 0.5 mg by mouth daily as needed (for urination).     Marland Kitchen esomeprazole (NEXIUM) 40 MG capsule Take 40 mg by mouth daily as needed (for heartburn).     . febuxostat (ULORIC) 40 MG tablet Take 20 mg by mouth daily.     . fluticasone furoate-vilanterol (BREO ELLIPTA) 100-25 MCG/INH AEPB Inhale 1 puff into the lungs daily. 1 each 5  . hydrALAZINE (APRESOLINE) 25 MG tablet Take 1 tablet (25 mg total) by mouth 3 (three) times daily as needed. (Patient taking differently: Take 25 mg by mouth 3 (three) times daily as needed (for BP > 150). ) 90 tablet 6  . metoprolol succinate (TOPROL-XL) 50 MG 24 hr tablet Take 1 tablet (50 mg total) by mouth daily. Take with or immediately following a meal. (Patient taking differently: Take 50 mg by mouth at bedtime. Take with or immediately following a meal.) 90 tablet 3  . metroNIDAZOLE (FLAGYL) 500 MG tablet Take 500 mg by mouth See admin instructions. Take 500 mg by mouth twice daily on Sunday and Monday    . propranolol (INDERAL) 40 MG tablet TAKE ONE (1) TABLET THREE (3) TIMES EACHDAY AS NEEDED 270 tablet 1  . silodosin (RAPAFLO) 8 MG CAPS capsule Take 8 mg by mouth daily as needed (for urintation).     . terazosin (HYTRIN) 1 MG capsule Take 1 mg by mouth at bedtime.    Marland Kitchen testosterone cypionate (DEPOTESTOSTERONE CYPIONATE) 200 MG/ML injection Inject 1 mL (200 mg total) every 28 (twenty-eight) days into the muscle. 10 mL 0  . traMADol  (ULTRAM) 50 MG tablet Take 50 mg by mouth every 6 (six) hours as needed for moderate pain.      No current facility-administered medications for this encounter.     Allergies  Allergen Reactions  . Iodine Hives and Other (See Comments)    IVP contrast  . Penicillins Itching, Rash and Other (See Comments)    ITCHY FEELING IN FINGERS Has patient had a PCN reaction causing immediate rash, facial/tongue/throat swelling, SOB or lightheadedness with hypotension: No Has patient had a PCN reaction causing severe rash involving mucus membranes  or skin necrosis: No Has patient had a PCN reaction that required hospitalization No Has patient had a PCN reaction occurring within the last 10 years: No If all of the above answers are "NO", then may proceed with Cephalosporin use.     Social History   Socioeconomic History  . Marital status: Married    Spouse name: Not on file  . Number of children: 3  . Years of education: MD   . Highest education level: Not on file  Occupational History  . Occupation: Retired   Scientific laboratory technician  . Financial resource strain: Not on file  . Food insecurity:    Worry: Not on file    Inability: Not on file  . Transportation needs:    Medical: Not on file    Non-medical: Not on file  Tobacco Use  . Smoking status: Never Smoker  . Smokeless tobacco: Never Used  Substance and Sexual Activity  . Alcohol use: Yes    Alcohol/week: 0.0 - 1.2 oz    Comment: occasionally  . Drug use: No  . Sexual activity: Yes  Lifestyle  . Physical activity:    Days per week: Not on file    Minutes per session: Not on file  . Stress: Not on file  Relationships  . Social connections:    Talks on phone: Not on file    Gets together: Not on file    Attends religious service: Not on file    Active member of club or organization: Not on file    Attends meetings of clubs or organizations: Not on file    Relationship status: Not on file  . Intimate partner violence:    Fear of  current or ex partner: Not on file    Emotionally abused: Not on file    Physically abused: Not on file    Forced sexual activity: Not on file  Other Topics Concern  . Not on file  Social History Narrative   Independent at baseline. Lives with family   Drinks tea in the morning     Family History  Problem Relation Age of Onset  . Heart attack Brother   . Stroke Brother   . Aortic stenosis Mother   . Arthritis Father     ROS- All systems are reviewed and negative except as per the HPI above  Physical Exam: Vitals:   09/22/17 1011  BP: 132/84  Pulse: 70  Weight: 164 lb (74.4 kg)  Height: 5\' 7"  (1.702 m)   Wt Readings from Last 3 Encounters:  09/22/17 164 lb (74.4 kg)  09/15/17 159 lb 6.4 oz (72.3 kg)  09/09/17 168 lb 3.2 oz (76.3 kg)    Labs: Lab Results  Component Value Date   NA 135 09/22/2017   K 4.3 09/22/2017   CL 96 (L) 09/22/2017   CO2 29 09/22/2017   GLUCOSE 86 09/22/2017   BUN 19 09/22/2017   CREATININE 1.47 (H) 09/22/2017   CALCIUM 9.6 09/22/2017   MG 2.0 09/22/2017   Lab Results  Component Value Date   INR 1.06 06/25/2016   Lab Results  Component Value Date   CHOL 119 07/11/2016   HDL 53 07/11/2016   LDLCALC 58 07/11/2016   TRIG 39 07/11/2016     GEN- The patient is well appearing, alert and oriented x 3 today.   Head- normocephalic, atraumatic Eyes-  Sclera clear, conjunctiva pink Ears- hearing intact Oropharynx- clear Neck- supple, no JVP Lymph- no cervical lymphadenopathy Lungs- Clear to  ausculation bilaterally, normal work of breathing Heart- Regular rate and rhythm, no murmurs, rubs or gallops, PMI not laterally displaced GI- soft, NT, ND, + BS Extremities- no clubbing, cyanosis, or edema MS- no significant deformity or atrophy Skin- no rash or lesion Psych- euthymic mood, full affect Neuro- strength and sensation are intact  EKG-AV paced at 70 bpm, pr int 186 ms, qrs int 186 ms, qtc 522 ms     Assessment and Plan: 1.  Paroxysmal afib One week s/p tikosyn admit Tikosyn precautions reviewed No missed doses of eliquis 2.5 mg bid( over 80, Creatinine hovers 1.45 to 1.6) No benadryl use Bmet/mag today is stable    He will need a pain back injection at some point but reminded to wait until 4 weeks after Tikosyn initiation   F/u with Dr. Caryl Comes 6/12  Butch Penny C. Hitesh Fouche, [facility] [address] [place], [place] [address] [phone]

## 2017-09-24 ENCOUNTER — Other Ambulatory Visit: Payer: Self-pay | Admitting: Family Medicine

## 2017-09-24 MED ORDER — COLCHICINE 0.6 MG PO TABS: 0.6000 mg | ORAL_TABLET | Freq: Every day | ORAL | 5 refills | Status: DC | PRN

## 2017-09-24 NOTE — Telephone Encounter (Signed)
Please advise. Thanks.  

## 2017-09-24 NOTE — Telephone Encounter (Signed)
Pt wants Colchicine sent [facility].  He said Dr. Rosanna Randy has not ever prescribed this for him but he has ran out and Dr. Rosanna Randy knows that he uses this..  Thanks teri

## 2017-10-07 DIAGNOSIS — B351 Tinea unguium: Secondary | ICD-10-CM | POA: Diagnosis not present

## 2017-10-07 DIAGNOSIS — Z85828 Personal history of other malignant neoplasm of skin: Secondary | ICD-10-CM | POA: Diagnosis not present

## 2017-10-07 DIAGNOSIS — L578 Other skin changes due to chronic exposure to nonionizing radiation: Secondary | ICD-10-CM | POA: Diagnosis not present

## 2017-10-07 DIAGNOSIS — L570 Actinic keratosis: Secondary | ICD-10-CM | POA: Diagnosis not present

## 2017-10-07 DIAGNOSIS — L821 Other seborrheic keratosis: Secondary | ICD-10-CM | POA: Diagnosis not present

## 2017-10-13 ENCOUNTER — Other Ambulatory Visit: Payer: Self-pay | Admitting: Internal Medicine

## 2017-10-13 NOTE — Telephone Encounter (Signed)
Please review for refill. Thanks!  

## 2017-10-14 ENCOUNTER — Other Ambulatory Visit: Payer: Self-pay | Admitting: Internal Medicine

## 2017-10-14 NOTE — Telephone Encounter (Signed)
This is a [facility] pt 

## 2017-10-15 ENCOUNTER — Ambulatory Visit: Payer: Medicare Other | Admitting: Internal Medicine

## 2017-10-15 ENCOUNTER — Encounter: Payer: Self-pay | Admitting: Internal Medicine

## 2017-10-15 VITALS — BP 134/80 | Ht 67.0 in | Wt 168.4 lb

## 2017-10-15 DIAGNOSIS — Z950 Presence of cardiac pacemaker: Secondary | ICD-10-CM | POA: Diagnosis not present

## 2017-10-15 DIAGNOSIS — I480 Paroxysmal atrial fibrillation: Secondary | ICD-10-CM

## 2017-10-15 DIAGNOSIS — I639 Cerebral infarction, unspecified: Secondary | ICD-10-CM

## 2017-10-15 DIAGNOSIS — I2510 Atherosclerotic heart disease of native coronary artery without angina pectoris: Secondary | ICD-10-CM | POA: Diagnosis not present

## 2017-10-15 DIAGNOSIS — I442 Atrioventricular block, complete: Secondary | ICD-10-CM | POA: Diagnosis not present

## 2017-10-15 LAB — CUP PACEART INCLINIC DEVICE CHECK
Battery Remaining Longevity: 128 mo
Brady Statistic AP VS Percent: 0.27 %
Brady Statistic AS VP Percent: 8.95 %
Brady Statistic AS VS Percent: 0.97 %
Brady Statistic RA Percent Paced: 72.03 %
Brady Statistic RV Percent Paced: 98.66 %
Date Time Interrogation Session: 20190612163205
Implantable Lead Implant Date: 20180214
Implantable Lead Location: 753859
Implantable Lead Model: 5076
Implantable Lead Model: 5076
Lead Channel Impedance Value: 342 Ohm
Lead Channel Impedance Value: 361 Ohm
Lead Channel Impedance Value: 456 Ohm
Lead Channel Pacing Threshold Pulse Width: 0.4 ms
Lead Channel Sensing Intrinsic Amplitude: 17.875 mV
Lead Channel Sensing Intrinsic Amplitude: 17.875 mV
Lead Channel Setting Pacing Amplitude: 2 V
Lead Channel Setting Pacing Amplitude: 2.5 V
Lead Channel Setting Pacing Pulse Width: 0.4 ms
Lead Channel Setting Sensing Sensitivity: 0.9 mV
MDC IDC LEAD IMPLANT DT: 20180214
MDC IDC LEAD LOCATION: 753860
MDC IDC MSMT BATTERY VOLTAGE: 3.02 V
MDC IDC MSMT LEADCHNL RA IMPEDANCE VALUE: 437 Ohm
MDC IDC MSMT LEADCHNL RA PACING THRESHOLD AMPLITUDE: 0.5 V
MDC IDC MSMT LEADCHNL RA SENSING INTR AMPL: 2.75 mV
MDC IDC MSMT LEADCHNL RA SENSING INTR AMPL: 4 mV
MDC IDC MSMT LEADCHNL RV PACING THRESHOLD AMPLITUDE: 0.75 V
MDC IDC MSMT LEADCHNL RV PACING THRESHOLD PULSEWIDTH: 0.4 ms
MDC IDC PG IMPLANT DT: 20180214
MDC IDC STAT BRADY AP VP PERCENT: 89.8 %

## 2017-10-15 NOTE — Patient Instructions (Addendum)
Medication Instructions:  Your physician recommends that you continue on your current medications as directed. Please refer to the Current Medication list given to you today.  Labwork: None ordered.  Testing/Procedures: None ordered.  Follow-Up: Your physician wants you to follow-up in: 6 Months with Dr Caryl Comes in [place]. You will receive a reminder letter in the mail two months in advance. If you don't receive a letter, please call our office to schedule the follow-up appointment.  Remote monitoring is used to monitor your Pacemaker of ICD from home. This monitoring reduces the number of office visits required to check your device to one time per year. It allows [place] to keep an eye on the functioning of your device to ensure it is working properly. You are scheduled for a device check from home on 10/30/2017. You may send your transmission at any time that day. If you have a wireless device, the transmission will be sent automatically. After your physician reviews your transmission, you will receive a postcard with your next transmission date.     Any Other Special Instructions Will Be Listed Below (If Applicable).     If you need a refill on your cardiac medications before your next appointment, please call your pharmacy.

## 2017-10-15 NOTE — Progress Notes (Signed)
Patient Care Team: Jerrol Banana., MD as PCP - General (Family Medicine) Minna Merritts, MD as Consulting Physician (Cardiology) Anne Hahn, MD (Family Medicine) Deboraha Sprang, MD as Consulting Physician (Cardiology) Leandrew Koyanagi, MD as Referring Physician (Ophthalmology)   HPI  Thomas [name] is a 84 y.o. male Seen in follow-up for pacemaker implanted 2/18 for complete heart block. He also has paroxysmal atrial flutter  on anticoagulation his CHADS-VASc score is greater than or equal to 6 with a prior TIA.  Echocardiogram 2/18 EF 55-60% with normal right-sided function -this represented interval normalization  With worsening shortness of breath he was admitted for Tikosyn.  This was successful in restoring sinus rhythm.  Pre-evaluation for coronary disease included a CT scan which demonstrated pneumonia.  He was seen by pulmonary and was treated with antibiotics.  He is vastly improved.  On Anticoagulation;  No bleeding issues     Date Cr K Mg Hgb  6/18 1.46   14.4  5/19 1.47 4.3 2.0 16       Past Medical History:  Diagnosis Date  . Aortic valve disorders   . Arthritis   . Atrial flutter ([place]) 06/18/2016   "AF or AFl; not sure which" (06/23/2016)  . Basal cell carcinoma    "face, nose left shoulder, left arm" (06/19/2016)  . BBB (bundle branch block)    hx right  . Chronic back pain    "neck, thoracic, lower back" (06/19/2016)  . Complete heart block ([place]) 06/2016  . Dyspnea   . GERD (gastroesophageal reflux disease)   . Heart block    "I've had type I, II Wenke before now" (06/19/2016)  . History of gout   . History of hiatal hernia    "self dx'd" (06/19/2016)  . Hyperlipidemia   . Hypertension   . Lyme disease    "dx'd by me 2003; cx's showed dx 08/2015"  . Migraine    "3-4/year" (06/19/2016)  . Presence of permanent cardiac pacemaker 06/19/2016  . PVC's (premature ventricular contractions)   . Renal cancer, left ([place]) 2006   S/P  cryotherapy  . Spinal stenosis    "cervical, 1 thoracic, lumbar" (06/19/2016)  . Squamous carcinoma    "face, nose left shoulder, left arm" (06/19/2016)  . Stroke ([place])   . TIA (transient ischemic attack) 06/14/2016   "I'm not sure that's what it was" (06/25/2016)  . Visit for monitoring Tikosyn therapy 09/09/2017    Past Surgical History:  Procedure Laterality Date  . ANKLE FRACTURE SURGERY Right 1967  . BASAL CELL CARCINOMA EXCISION     "face, nose left shoulder, left arm"  . BIOPSY PROSTATE  2001 & 2003  . CARDIAC CATHETERIZATION  1990's  . CARDIOVERSION N/A 09/11/2017   Procedure: CARDIOVERSION;  Surgeon: Lelon Perla, MD;  Location: [facility] ENDOSCOPY;  Service: Cardiovascular;  Laterality: N/A;  . FRACTURE SURGERY    . INGUINAL HERNIA REPAIR Left 2012  . INSERT / REPLACE / REMOVE PACEMAKER  06/19/2016  . LAPAROSCOPIC ABLATION RENAL MASS    . PACEMAKER IMPLANT N/A 06/19/2016   Procedure: Pacemaker Implant;  Surgeon: Deboraha Sprang, MD;  Location: [place] [facility] LAB;  Service: Cardiovascular;  Laterality: N/A;  . pacemasker    . PROSTATE SURGERY    . SQUAMOUS CELL CARCINOMA EXCISION     "face, nose left shoulder, left arm"  . TONSILLECTOMY AND ADENOIDECTOMY      Current Outpatient Medications  Medication Sig Dispense Refill  .  Albuterol Sulfate 108 (90 Base) MCG/ACT AEPB Inhale 1-2 puffs into the lungs every 4 (four) hours as needed. (Patient taking differently: Inhale 1-2 puffs into the lungs every 4 (four) hours as needed (for shortness of breath or wheezing). ) 1 each 11  . amLODipine (NORVASC) 5 MG tablet Take 1 tablet (5 mg total) by mouth daily. 180 tablet 4  . apixaban (ELIQUIS) 2.5 MG TABS tablet Take 2.5 mg by mouth 2 (two) times daily.    Marland Kitchen atorvastatin (LIPITOR) 40 MG tablet TAKE 1 TABLET BY MOUTH EVERY DAY AT 6PM 90 tablet 3  . colchicine (COLCRYS) 0.6 MG tablet Take 1 tablet (0.6 mg total) by mouth daily as needed (for gout). 30 tablet 5  . diclofenac sodium  (VOLTAREN) 1 % GEL Apply 2 g topically daily as needed (for pain).     Marland Kitchen dofetilide (TIKOSYN) 125 MCG capsule Take 1 capsule (125 mcg total) by mouth 2 (two) times daily. 180 capsule 1  . doxycycline (VIBRAMYCIN) 100 MG capsule Take 100 mg by mouth 2 (two) times daily as needed.    . dutasteride (AVODART) 0.5 MG capsule Take 0.5 mg by mouth daily as needed (for urination).     Marland Kitchen esomeprazole (NEXIUM) 40 MG capsule Take 40 mg by mouth daily as needed (for heartburn).     . febuxostat (ULORIC) 40 MG tablet Take 20 mg by mouth daily.     . fluticasone furoate-vilanterol (BREO ELLIPTA) 100-25 MCG/INH AEPB Inhale 1 puff into the lungs daily. 1 each 5  . hydrALAZINE (APRESOLINE) 25 MG tablet Take 1 tablet (25 mg total) by mouth 3 (three) times daily as needed. (Patient taking differently: Take 25 mg by mouth 3 (three) times daily as needed (for BP > 150). ) 90 tablet 6  . metoprolol succinate (TOPROL-XL) 50 MG 24 hr tablet TAKE 1 TABLET BY MOUTH DAILY. TAKE WITH OR IMMEDIATELY FOLLOWING A MEAL. 90 tablet 3  . metroNIDAZOLE (FLAGYL) 500 MG tablet Take 500 mg by mouth See admin instructions. Take 500 mg by mouth twice daily on Sunday and Monday    . propranolol (INDERAL) 40 MG tablet TAKE ONE (1) TABLET THREE (3) TIMES EACHDAY AS NEEDED 270 tablet 1  . silodosin (RAPAFLO) 8 MG CAPS capsule Take 8 mg by mouth daily as needed (for urintation).     . terazosin (HYTRIN) 1 MG capsule Take 1 mg by mouth at bedtime.    . terbinafine (LAMISIL) 250 MG tablet Take 1 tablet by mouth daily.    Marland Kitchen testosterone cypionate (DEPOTESTOSTERONE CYPIONATE) 200 MG/ML injection Inject 1 mL (200 mg total) every 28 (twenty-eight) days into the muscle. 10 mL 0  . traMADol (ULTRAM) 50 MG tablet Take 50 mg by mouth every 6 (six) hours as needed for moderate pain.      No current facility-administered medications for this visit.     Allergies  Allergen Reactions  . Iodine Hives and Other (See Comments)    IVP contrast  .  Penicillins Itching, Rash and Other (See Comments)    ITCHY FEELING IN FINGERS Has patient had a PCN reaction causing immediate rash, facial/tongue/throat swelling, SOB or lightheadedness with hypotension: No Has patient had a PCN reaction causing severe rash involving mucus membranes or skin necrosis: No Has patient had a PCN reaction that required hospitalization No Has patient had a PCN reaction occurring within the last 10 years: No If all of the above answers are "NO", then may proceed with Cephalosporin use.  Review of Systems negative except from HPI and PMH  Physical Exam BP 134/80   Ht 5\' 7"  (1.702 m)   Wt 168 lb 6.4 oz (76.4 kg)   SpO2 95%   BMI 26.38 kg/m  Well developed and nourished in no acute distress HENT normal Neck supple with JVP-flat Carotids brisk and full without bruits Clear Regular rate and rhythm, 2/6 systolic m Abd-soft with active BS without hepatomegaly No Clubbing cyanosis edema Skin-warm and dry A & Oriented  Grossly normal sensory and motor function      ECG AV pacing 18/18/46  Assessment and Plan:  Atrial fibrillation-persistent  TIA  Coronary artery disease-   Complete heart block    Hypertension  Pacemaker Medtronic   Renal insufficiency grade 3  Sleep disordered breathing  Chronic Lyme disease    He is vastly improved he will need a 2 view chest x-ray to clarify the resolution of his pneumonia  His CT FFR demonstrated a high-grade lesion in his LAD.  I will discuss with Dr. Gaylyn Cheers tomorrow regarding catheterization.   Blood pressure is well controlled, his wife says for the first time in decades. We spent more than 50% of our >25 min visit in face to face counseling regarding the above

## 2017-10-15 NOTE — H&P (View-Only) (Signed)
Patient Care Team: Jerrol Banana., MD as PCP - General (Family Medicine) Minna Merritts, MD as Consulting Physician (Cardiology) Anne Hahn, MD (Family Medicine) Deboraha Sprang, MD as Consulting Physician (Cardiology) Leandrew Koyanagi, MD as Referring Physician (Ophthalmology)   HPI  Thomas [name] is a 84 y.o. male Seen in follow-up for pacemaker implanted 2/18 for complete heart block. He also has paroxysmal atrial flutter  on anticoagulation his CHADS-VASc score is greater than or equal to 6 with a prior TIA.  Echocardiogram 2/18 EF 55-60% with normal right-sided function -this represented interval normalization  With worsening shortness of breath he was admitted for Tikosyn.  This was successful in restoring sinus rhythm.  Pre-evaluation for coronary disease included a CT scan which demonstrated pneumonia.  He was seen by pulmonary and was treated with antibiotics.  He is vastly improved.  On Anticoagulation;  No bleeding issues     Date Cr K Mg Hgb  6/18 1.46   14.4  5/19 1.47 4.3 2.0 16       Past Medical History:  Diagnosis Date  . Aortic valve disorders   . Arthritis   . Atrial flutter ([place]) 06/18/2016   "AF or AFl; not sure which" (06/23/2016)  . Basal cell carcinoma    "face, nose left shoulder, left arm" (06/19/2016)  . BBB (bundle branch block)    hx right  . Chronic back pain    "neck, thoracic, lower back" (06/19/2016)  . Complete heart block ([place]) 06/2016  . Dyspnea   . GERD (gastroesophageal reflux disease)   . Heart block    "I've had type I, II Wenke before now" (06/19/2016)  . History of gout   . History of hiatal hernia    "self dx'd" (06/19/2016)  . Hyperlipidemia   . Hypertension   . Lyme disease    "dx'd by me 2003; cx's showed dx 08/2015"  . Migraine    "3-4/year" (06/19/2016)  . Presence of permanent cardiac pacemaker 06/19/2016  . PVC's (premature ventricular contractions)   . Renal cancer, left ([place]) 2006   S/P  cryotherapy  . Spinal stenosis    "cervical, 1 thoracic, lumbar" (06/19/2016)  . Squamous carcinoma    "face, nose left shoulder, left arm" (06/19/2016)  . Stroke ([place])   . TIA (transient ischemic attack) 06/14/2016   "I'm not sure that's what it was" (06/25/2016)  . Visit for monitoring Tikosyn therapy 09/09/2017    Past Surgical History:  Procedure Laterality Date  . ANKLE FRACTURE SURGERY Right 1967  . BASAL CELL CARCINOMA EXCISION     "face, nose left shoulder, left arm"  . BIOPSY PROSTATE  2001 & 2003  . CARDIAC CATHETERIZATION  1990's  . CARDIOVERSION N/A 09/11/2017   Procedure: CARDIOVERSION;  Surgeon: Lelon Perla, MD;  Location: [facility] ENDOSCOPY;  Service: Cardiovascular;  Laterality: N/A;  . FRACTURE SURGERY    . INGUINAL HERNIA REPAIR Left 2012  . INSERT / REPLACE / REMOVE PACEMAKER  06/19/2016  . LAPAROSCOPIC ABLATION RENAL MASS    . PACEMAKER IMPLANT N/A 06/19/2016   Procedure: Pacemaker Implant;  Surgeon: Deboraha Sprang, MD;  Location: [place] CV LAB;  Service: Cardiovascular;  Laterality: N/A;  . pacemasker    . PROSTATE SURGERY    . SQUAMOUS CELL CARCINOMA EXCISION     "face, nose left shoulder, left arm"  . TONSILLECTOMY AND ADENOIDECTOMY      Current Outpatient Medications  Medication Sig Dispense Refill  .  Albuterol Sulfate 108 (90 Base) MCG/ACT AEPB Inhale 1-2 puffs into the lungs every 4 (four) hours as needed. (Patient taking differently: Inhale 1-2 puffs into the lungs every 4 (four) hours as needed (for shortness of breath or wheezing). ) 1 each 11  . amLODipine (NORVASC) 5 MG tablet Take 1 tablet (5 mg total) by mouth daily. 180 tablet 4  . apixaban (ELIQUIS) 2.5 MG TABS tablet Take 2.5 mg by mouth 2 (two) times daily.    Marland Kitchen atorvastatin (LIPITOR) 40 MG tablet TAKE 1 TABLET BY MOUTH EVERY DAY AT 6PM 90 tablet 3  . colchicine (COLCRYS) 0.6 MG tablet Take 1 tablet (0.6 mg total) by mouth daily as needed (for gout). 30 tablet 5  . diclofenac sodium  (VOLTAREN) 1 % GEL Apply 2 g topically daily as needed (for pain).     Marland Kitchen dofetilide (TIKOSYN) 125 MCG capsule Take 1 capsule (125 mcg total) by mouth 2 (two) times daily. 180 capsule 1  . doxycycline (VIBRAMYCIN) 100 MG capsule Take 100 mg by mouth 2 (two) times daily as needed.    . dutasteride (AVODART) 0.5 MG capsule Take 0.5 mg by mouth daily as needed (for urination).     Marland Kitchen esomeprazole (NEXIUM) 40 MG capsule Take 40 mg by mouth daily as needed (for heartburn).     . febuxostat (ULORIC) 40 MG tablet Take 20 mg by mouth daily.     . fluticasone furoate-vilanterol (BREO ELLIPTA) 100-25 MCG/INH AEPB Inhale 1 puff into the lungs daily. 1 each 5  . hydrALAZINE (APRESOLINE) 25 MG tablet Take 1 tablet (25 mg total) by mouth 3 (three) times daily as needed. (Patient taking differently: Take 25 mg by mouth 3 (three) times daily as needed (for BP > 150). ) 90 tablet 6  . metoprolol succinate (TOPROL-XL) 50 MG 24 hr tablet TAKE 1 TABLET BY MOUTH DAILY. TAKE WITH OR IMMEDIATELY FOLLOWING A MEAL. 90 tablet 3  . metroNIDAZOLE (FLAGYL) 500 MG tablet Take 500 mg by mouth See admin instructions. Take 500 mg by mouth twice daily on Sunday and Monday    . propranolol (INDERAL) 40 MG tablet TAKE ONE (1) TABLET THREE (3) TIMES EACHDAY AS NEEDED 270 tablet 1  . silodosin (RAPAFLO) 8 MG CAPS capsule Take 8 mg by mouth daily as needed (for urintation).     . terazosin (HYTRIN) 1 MG capsule Take 1 mg by mouth at bedtime.    . terbinafine (LAMISIL) 250 MG tablet Take 1 tablet by mouth daily.    Marland Kitchen testosterone cypionate (DEPOTESTOSTERONE CYPIONATE) 200 MG/ML injection Inject 1 mL (200 mg total) every 28 (twenty-eight) days into the muscle. 10 mL 0  . traMADol (ULTRAM) 50 MG tablet Take 50 mg by mouth every 6 (six) hours as needed for moderate pain.      No current facility-administered medications for this visit.     Allergies  Allergen Reactions  . Iodine Hives and Other (See Comments)    IVP contrast  .  Penicillins Itching, Rash and Other (See Comments)    ITCHY FEELING IN FINGERS Has patient had a PCN reaction causing immediate rash, facial/tongue/throat swelling, SOB or lightheadedness with hypotension: No Has patient had a PCN reaction causing severe rash involving mucus membranes or skin necrosis: No Has patient had a PCN reaction that required hospitalization No Has patient had a PCN reaction occurring within the last 10 years: No If all of the above answers are "NO", then may proceed with Cephalosporin use.  Review of Systems negative except from HPI and PMH  Physical Exam BP 134/80   Ht 5\' 7"  (1.702 m)   Wt 168 lb 6.4 oz (76.4 kg)   SpO2 95%   BMI 26.38 kg/m  Well developed and nourished in no acute distress HENT normal Neck supple with JVP-flat Carotids brisk and full without bruits Clear Regular rate and rhythm, 2/6 systolic m Abd-soft with active BS without hepatomegaly No Clubbing cyanosis edema Skin-warm and dry A & Oriented  Grossly normal sensory and motor function      ECG AV pacing 18/18/46  Assessment and Plan:  Atrial fibrillation-persistent  TIA  Coronary artery disease-   Complete heart block    Hypertension  Pacemaker Medtronic   Renal insufficiency grade 3  Sleep disordered breathing  Chronic Lyme disease    He is vastly improved he will need a 2 view chest x-ray to clarify the resolution of his pneumonia  His CT FFR demonstrated a high-grade lesion in his LAD.  I will discuss with Dr. Gaylyn Cheers tomorrow regarding catheterization.   Blood pressure is well controlled, his wife says for the first time in decades. We spent more than 50% of our >25 min visit in face to face counseling regarding the above

## 2017-10-16 ENCOUNTER — Ambulatory Visit
Admission: RE | Admit: 2017-10-16 | Discharge: 2017-10-16 | Disposition: A | Discharge status: Home / Self Care | Payer: Medicare Other | Source: Ambulatory Visit | Attending: Internal Medicine | Admitting: Internal Medicine

## 2017-10-16 ENCOUNTER — Telehealth: Payer: Self-pay

## 2017-10-16 DIAGNOSIS — I480 Paroxysmal atrial fibrillation: Secondary | ICD-10-CM

## 2017-10-16 DIAGNOSIS — Z950 Presence of cardiac pacemaker: Secondary | ICD-10-CM | POA: Diagnosis not present

## 2017-10-16 DIAGNOSIS — I2510 Atherosclerotic heart disease of native coronary artery without angina pectoris: Secondary | ICD-10-CM | POA: Insufficient documentation

## 2017-10-16 DIAGNOSIS — I639 Cerebral infarction, unspecified: Secondary | ICD-10-CM | POA: Insufficient documentation

## 2017-10-16 DIAGNOSIS — J189 Pneumonia, unspecified organism: Secondary | ICD-10-CM | POA: Insufficient documentation

## 2017-10-16 DIAGNOSIS — I442 Atrioventricular block, complete: Secondary | ICD-10-CM | POA: Insufficient documentation

## 2017-10-16 NOTE — Addendum Note (Signed)
Addended by: Dollene Primrose on: 10/16/2017 12:00 PM   Modules accepted: Orders

## 2017-10-16 NOTE — Telephone Encounter (Signed)
Spoke with pt's wife regarding Dr Olin Pia recommendation for a CXR. Order was placed and pt may go to [facility] at [facility]. VM with instructions were also left on pt's home phone.

## 2017-10-17 ENCOUNTER — Other Ambulatory Visit: Payer: Self-pay | Admitting: Cardiovascular Disease

## 2017-10-17 ENCOUNTER — Ambulatory Visit: Payer: Medicare Other | Admitting: Anesthesiology

## 2017-10-20 ENCOUNTER — Other Ambulatory Visit: Payer: Self-pay | Admitting: Cardiovascular Disease

## 2017-10-20 ENCOUNTER — Encounter: Payer: Self-pay | Admitting: Internal Medicine

## 2017-10-20 NOTE — Telephone Encounter (Signed)
Follow up    Patient spouse calling, requesting to have procedure done in [facility]

## 2017-10-20 NOTE — Telephone Encounter (Signed)
Left a message for the pt to call the office back as needed, and request to speak with a triage nurse.

## 2017-10-21 ENCOUNTER — Ambulatory Visit
Admission: RE | Admit: 2017-10-21 | Discharge: 2017-10-21 | Disposition: A | Discharge status: Home / Self Care | Payer: Medicare Other | Source: Ambulatory Visit | Attending: Anesthesiology | Admitting: Anesthesiology

## 2017-10-21 ENCOUNTER — Other Ambulatory Visit: Payer: Self-pay | Admitting: Anesthesiology

## 2017-10-21 ENCOUNTER — Telehealth: Payer: Self-pay | Admitting: Internal Medicine

## 2017-10-21 ENCOUNTER — Ambulatory Visit: Payer: Medicare Other | Attending: Anesthesiology | Admitting: Anesthesiology

## 2017-10-21 ENCOUNTER — Other Ambulatory Visit: Payer: Self-pay

## 2017-10-21 ENCOUNTER — Encounter: Payer: Self-pay | Admitting: Anesthesiology

## 2017-10-21 VITALS — BP 158/98 | HR 68 | Temp 97.8°F | Resp 17 | Ht 67.0 in | Wt 164.0 lb

## 2017-10-21 DIAGNOSIS — Z79891 Long term (current) use of opiate analgesic: Secondary | ICD-10-CM | POA: Insufficient documentation

## 2017-10-21 DIAGNOSIS — Z79899 Other long term (current) drug therapy: Secondary | ICD-10-CM | POA: Diagnosis not present

## 2017-10-21 DIAGNOSIS — M5432 Sciatica, left side: Secondary | ICD-10-CM | POA: Diagnosis not present

## 2017-10-21 DIAGNOSIS — M5136 Other intervertebral disc degeneration, lumbar region: Secondary | ICD-10-CM | POA: Diagnosis not present

## 2017-10-21 DIAGNOSIS — R52 Pain, unspecified: Secondary | ICD-10-CM | POA: Diagnosis not present

## 2017-10-21 DIAGNOSIS — M48061 Spinal stenosis, lumbar region without neurogenic claudication: Secondary | ICD-10-CM | POA: Insufficient documentation

## 2017-10-21 DIAGNOSIS — M5431 Sciatica, right side: Secondary | ICD-10-CM | POA: Diagnosis not present

## 2017-10-21 DIAGNOSIS — M545 Low back pain: Secondary | ICD-10-CM | POA: Diagnosis not present

## 2017-10-21 DIAGNOSIS — G8929 Other chronic pain: Secondary | ICD-10-CM | POA: Diagnosis not present

## 2017-10-21 MED ORDER — IOPAMIDOL (ISOVUE-M 200) INJECTION 41%: 20.0000 mL | Freq: Once | INTRAMUSCULAR | Status: DC | PRN

## 2017-10-21 MED ORDER — ROPIVACAINE HCL 2 MG/ML IJ SOLN
10.0000 mL | Freq: Once | INTRAMUSCULAR | Status: AC
  Administered 2017-10-21: 10 mL via EPIDURAL
  Filled 2017-10-21: qty 10

## 2017-10-21 MED ORDER — LIDOCAINE HCL (PF) 1 % IJ SOLN
5.0000 mL | Freq: Once | INTRAMUSCULAR | Status: AC
  Administered 2017-10-21: 5 mL via SUBCUTANEOUS
  Filled 2017-10-21: qty 5

## 2017-10-21 MED ORDER — TRIAMCINOLONE ACETONIDE 40 MG/ML IJ SUSP
40.0000 mg | Freq: Once | INTRAMUSCULAR | Status: AC
  Administered 2017-10-21: 40 mg
  Filled 2017-10-21: qty 1

## 2017-10-21 MED ORDER — SODIUM CHLORIDE 0.9 % IJ SOLN
INTRAMUSCULAR | Status: AC
Start: 2017-10-21 — End: ?
  Filled 2017-10-21: qty 10

## 2017-10-21 MED ORDER — SODIUM CHLORIDE 0.9% FLUSH
10.0000 mL | Freq: Once | INTRAVENOUS | Status: AC
  Administered 2017-10-21: 10 mL

## 2017-10-21 NOTE — Patient Instructions (Signed)

## 2017-10-21 NOTE — Telephone Encounter (Signed)
Thomas [name] came by office Thomas [name] would rather have his cardiac cath and stent done in [facility] in case it goes into bypass Patient wife would like to know if it does go into a bypass, would he also be able to have his ablation done at that time as well  Please call Jacqlyn Larsen to discuss [phone]

## 2017-10-22 ENCOUNTER — Encounter: Payer: Self-pay | Admitting: *Deleted

## 2017-10-22 ENCOUNTER — Telehealth: Payer: Self-pay | Admitting: Cardiovascular Disease

## 2017-10-22 ENCOUNTER — Other Ambulatory Visit: Payer: Self-pay | Admitting: Nurse Practitioner

## 2017-10-22 ENCOUNTER — Other Ambulatory Visit
Admission: RE | Admit: 2017-10-22 | Discharge: 2017-10-22 | Disposition: A | Discharge status: Home / Self Care | Payer: Medicare Other | Source: Ambulatory Visit | Attending: Cardiovascular Disease | Admitting: Cardiovascular Disease

## 2017-10-22 ENCOUNTER — Other Ambulatory Visit: Payer: Self-pay | Admitting: Family Medicine

## 2017-10-22 DIAGNOSIS — I2510 Atherosclerotic heart disease of native coronary artery without angina pectoris: Secondary | ICD-10-CM

## 2017-10-22 DIAGNOSIS — Z01812 Encounter for preprocedural laboratory examination: Secondary | ICD-10-CM | POA: Insufficient documentation

## 2017-10-22 LAB — BASIC METABOLIC PANEL
ANION GAP: 9 (ref 5–15)
BUN: 33 mg/dL — ABNORMAL HIGH (ref 6–20)
CHLORIDE: 103 mmol/L (ref 101–111)
CO2: 22 mmol/L (ref 22–32)
Calcium: 9 mg/dL (ref 8.9–10.3)
Creatinine, Ser: 1.52 mg/dL — ABNORMAL HIGH (ref 0.61–1.24)
GFR calc Af Amer: 47 mL/min — ABNORMAL LOW (ref >60)
GFR calc non Af Amer: 41 mL/min — ABNORMAL LOW (ref >60)
GLUCOSE: 123 mg/dL — AB (ref 65–99)
POTASSIUM: 4.3 mmol/L (ref 3.5–5.1)
Sodium: 134 mmol/L — ABNORMAL LOW (ref 135–145)

## 2017-10-22 LAB — CBC WITH DIFFERENTIAL/PLATELET
BASOS ABS: 0 10*3/uL (ref 0–0.1)
Basophils Relative: 0 %
Eosinophils Absolute: 0 10*3/uL (ref 0–0.7)
Eosinophils Relative: 0 %
HEMATOCRIT: 48.5 % (ref 40.0–52.0)
Hemoglobin: 16 g/dL (ref 13.0–18.0)
LYMPHS ABS: 1.3 10*3/uL (ref 1.0–3.6)
LYMPHS PCT: 11 %
MCH: 29.2 pg (ref 26.0–34.0)
MCHC: 33 g/dL (ref 32.0–36.0)
MCV: 88.5 fL (ref 80.0–100.0)
MONOS PCT: 8 %
Monocytes Absolute: 0.9 10*3/uL (ref 0.2–1.0)
NEUTROS ABS: 9.6 10*3/uL — AB (ref 1.4–6.5)
Neutrophils Relative %: 81 %
Platelets: 158 10*3/uL (ref 150–440)
RBC: 5.49 MIL/uL (ref 4.40–5.90)
RDW: 14.5 % (ref 11.5–14.5)
WBC: 11.8 10*3/uL — ABNORMAL HIGH (ref 3.8–10.6)

## 2017-10-22 MED ORDER — PREDNISONE 50 MG PO TABS: ORAL_TABLET | ORAL | 0 refills | Status: DC

## 2017-10-22 NOTE — Telephone Encounter (Signed)
Pt requesting a refill of Metronidazole 500 MG 3 mth supply.  States the provider that was giving him before has retired.  States Dr. Rosanna Randy is aware he is taking this medication.  Pt states he is out.   Pt requesting refill of testosterone 200 mg injection  [employer] [facility]

## 2017-10-22 NOTE — Telephone Encounter (Signed)
[facility] calling to let us know patient will be having a cath in the morning at 830 and to arrive at 730 per Dr. Fletcher Anon .  Please add patient onto schedule and call patient wife Julieta Bellini to discuss [phone].

## 2017-10-22 NOTE — Telephone Encounter (Signed)
I called and spoke with [facility] in the cath lab. She states she spoke with Dr. Fletcher Anon and he will come in and cath the patient in the AM at 8:30 am. I have added him to the schedule. I have called the patient's wife and given her verbal pre procedure instructions- see letter dated 10/22/17. The patient has an iodine allergy.   I have advised his wife that he will need to be pre-treated with prednisone prior to his procedure. This has been sent to [facility].  The patient will go to the [place] this afternoon for pre-procedure lab work.  Orders placed.

## 2017-10-22 NOTE — Progress Notes (Signed)
Subjective:  Patient ID: Thomas [name], male    DOB: May 30, 1932  Age: 84 y.o. MRN: [id number]  CC: Back Pain   Procedure: L5-S1 epidural steroid under fluoroscopic guidance without sedation  HPI [name] presents for reevaluation.  He was last seen many months ago and did well with his last injection.  Thomas [name] receives periodic epidurals for his chronic low back pain.  The back pain has been stable in nature and intractable to conservative therapy with physical therapy or medication management alone.  He receives periodic epidural injections for his low back pain that generally you approximately 75 to 85% relief per his recollection.  These generally last for about 2 months before he gets recurrence of the same quality characteristic distribution of pain as per previous evaluation.  Thus far he is been able to avoid opioid medications and sleeps better after the injections with better pain control at night and is able to be more active and work on his exercise routine following the injections.  Outpatient Medications Prior to Visit  Medication Sig Dispense Refill  . Albuterol Sulfate 108 (90 Base) MCG/ACT AEPB Inhale 1-2 puffs into the lungs every 4 (four) hours as needed. (Patient taking differently: Inhale 1-2 puffs into the lungs every 4 (four) hours as needed (for shortness of breath or wheezing). ) 1 each 11  . amLODipine (NORVASC) 5 MG tablet Take 1 tablet (5 mg total) by mouth daily. 180 tablet 4  . apixaban (ELIQUIS) 2.5 MG TABS tablet Take 2.5 mg by mouth 2 (two) times daily.    Marland Kitchen atorvastatin (LIPITOR) 40 MG tablet TAKE 1 TABLET BY MOUTH EVERY DAY AT 6PM 90 tablet 3  . atorvastatin (LIPITOR) 40 MG tablet TAKE 1 TABLET BY MOUTH EVERY DAY AT 6PM 90 tablet 2  . colchicine (COLCRYS) 0.6 MG tablet Take 1 tablet (0.6 mg total) by mouth daily as needed (for gout). 30 tablet 5  . diclofenac sodium (VOLTAREN) 1 % GEL Apply 2 g topically daily as needed (for pain).     Marland Kitchen dofetilide (TIKOSYN)  125 MCG capsule Take 1 capsule (125 mcg total) by mouth 2 (two) times daily. 180 capsule 1  . dofetilide (TIKOSYN) 125 MCG capsule Take 125 mcg by mouth 2 (two) times daily.    Marland Kitchen doxycycline (VIBRAMYCIN) 100 MG capsule Take 100 mg by mouth 2 (two) times daily as needed.    . dutasteride (AVODART) 0.5 MG capsule Take 0.5 mg by mouth daily as needed (for urination).     Marland Kitchen esomeprazole (NEXIUM) 40 MG capsule Take 40 mg by mouth daily as needed (for heartburn).     . febuxostat (ULORIC) 40 MG tablet Take 20 mg by mouth daily.     . fluticasone furoate-vilanterol (BREO ELLIPTA) 100-25 MCG/INH AEPB Inhale 1 puff into the lungs daily. 1 each 5  . hydrALAZINE (APRESOLINE) 25 MG tablet Take 1 tablet (25 mg total) by mouth 3 (three) times daily as needed. (Patient taking differently: Take 25 mg by mouth 3 (three) times daily as needed (for BP > 150). ) 90 tablet 6  . metoprolol succinate (TOPROL-XL) 50 MG 24 hr tablet TAKE 1 TABLET BY MOUTH DAILY. TAKE WITH OR IMMEDIATELY FOLLOWING A MEAL. 90 tablet 3  . metroNIDAZOLE (FLAGYL) 500 MG tablet Take 500 mg by mouth See admin instructions. Take 500 mg by mouth twice daily on Sunday and Monday    . propranolol (INDERAL) 40 MG tablet TAKE ONE (1) TABLET THREE (3) TIMES EACHDAY AS  NEEDED 270 tablet 1  . silodosin (RAPAFLO) 8 MG CAPS capsule Take 8 mg by mouth daily as needed (for urintation).     . terazosin (HYTRIN) 1 MG capsule Take 1 mg by mouth at bedtime.    . terbinafine (LAMISIL) 250 MG tablet Take 1 tablet by mouth daily.    Marland Kitchen testosterone cypionate (DEPOTESTOSTERONE CYPIONATE) 200 MG/ML injection Inject 1 mL (200 mg total) every 28 (twenty-eight) days into the muscle. 10 mL 0  . traMADol (ULTRAM) 50 MG tablet Take 50 mg by mouth every 6 (six) hours as needed for moderate pain.      No facility-administered medications prior to visit.     Review of Systems CNS: No confusion or sedation Cardiac: No angina or palpitations GI: No abdominal pain or  constipation Constitutional: No nausea vomiting fevers or chills  Objective:  BP (!) 158/98   Pulse 68   Temp 97.8 F (36.6 C)   Resp 17   Ht 5\' 7"  (1.702 m)   Wt 164 lb (74.4 kg)   SpO2 98%   BMI 25.69 kg/m    BP Readings from Last 3 Encounters:  10/21/17 (!) 158/98  10/15/17 134/80  09/22/17 132/84     Wt Readings from Last 3 Encounters:  10/21/17 164 lb (74.4 kg)  10/15/17 168 lb 6.4 oz (76.4 kg)  09/22/17 164 lb (74.4 kg)     Physical Exam Pt is alert and oriented PERRL EOMI HEART IS RRR no murmur or rub LCTA no wheezing or rales MUSCULOSKELETAL reveals some paraspinous muscle tenderness but no overt trigger points.  He has good range of motion at the low back and his muscle tone and bulk to the lower extremities is at baseline.  Labs  Lab Results  Component Value Date   HGBA1C 5.6 07/11/2016   HGBA1C 5.5 06/14/2016   Lab Results  Component Value Date   LDLCALC 58 07/11/2016   CREATININE 1.47 (H) 09/22/2017    -------------------------------------------------------------------------------------------------------------------- Lab Results  Component Value Date   WBC 9.1 09/11/2017   HGB 16.0 09/11/2017   HCT 47.4 09/11/2017   PLT 168 09/11/2017   GLUCOSE 86 09/22/2017   CHOL 119 07/11/2016   TRIG 39 07/11/2016   HDL 53 07/11/2016   LDLCALC 58 07/11/2016   ALT 31 07/29/2017   AST 23 07/29/2017   NA 135 09/22/2017   K 4.3 09/22/2017   CL 96 (L) 09/22/2017   CREATININE 1.47 (H) 09/22/2017   BUN 19 09/22/2017   CO2 29 09/22/2017   TSH 2.840 07/29/2017   PSA 5.8 08/22/2014   INR 1.06 06/25/2016   HGBA1C 5.6 07/11/2016    --------------------------------------------------------------------------------------------------------------------- Dg C-arm 1-60 Min-no Report  Result Date: 10/21/2017 Fluoroscopy was utilized by the requesting physician.  No radiographic interpretation.     Assessment & Plan:   [place] was seen today for back  pain.  Diagnoses and all orders for this visit:  DDD (degenerative disc disease), lumbar -     Lumbar Epidural Injection; Future  Spinal stenosis of lumbar region without neurogenic claudication -     Lumbar Epidural Injection; Future  Bilateral sciatica -     Lumbar Epidural Injection; Future  Sciatica of left side -     Lumbar Epidural Injection; Future  Other orders -     triamcinolone acetonide (KENALOG-40) injection 40 mg -     sodium chloride flush (NS) 0.9 % injection 10 mL -     ropivacaine (PF) 2 mg/mL (0.2%) (NAROPIN) injection  10 mL -     lidocaine (PF) (XYLOCAINE) 1 % injection 5 mL -     iopamidol (ISOVUE-M) 41 % intrathecal injection 20 mL        ----------------------------------------------------------------------------------------------------------------------  Problem List Items Addressed This Visit    None    Visit Diagnoses    DDD (degenerative disc disease), lumbar    -  Primary   Relevant Medications   triamcinolone acetonide (KENALOG-40) injection 40 mg (Completed)   Other Relevant Orders   Lumbar Epidural Injection   Spinal stenosis of lumbar region without neurogenic claudication       Relevant Orders   Lumbar Epidural Injection   Bilateral sciatica       Relevant Orders   Lumbar Epidural Injection   Sciatica of left side       Relevant Orders   Lumbar Epidural Injection        ----------------------------------------------------------------------------------------------------------------------  1. DDD (degenerative disc disease), lumbar  - Lumbar Epidural Injection; Future  2. Spinal stenosis of lumbar region without neurogenic claudication We will proceed with a repeat epidural injection at the L5-S1 level today.  We have gone over the risks and benefits with him with full details reviewed.  He is to return to clinic in approximately 2 to 3 months for possible repeat injection at that time if needed. - Lumbar Epidural Injection;  Future  3. Bilateral sciatica As above - Lumbar Epidural Injection; Future  4. Sciatica of left side Continue with core stretching strengthening exercises once again reviewed with him today. - Lumbar Epidural Injection; Future    ----------------------------------------------------------------------------------------------------------------------  I am having Thomas [name] maintain his esomeprazole, febuxostat, VOLTAREN, hydrALAZINE, terazosin, metroNIDAZOLE, traMADol, propranolol, dutasteride, silodosin, testosterone cypionate, Albuterol Sulfate, fluticasone furoate-vilanterol, apixaban, dofetilide, amLODipine, colchicine, metoprolol succinate, terbinafine, doxycycline, atorvastatin, atorvastatin, and dofetilide. We administered triamcinolone acetonide, sodium chloride flush, ropivacaine (PF) 2 mg/mL (0.2%), and lidocaine (PF).   Meds ordered this encounter  Medications  . triamcinolone acetonide (KENALOG-40) injection 40 mg  . sodium chloride flush (NS) 0.9 % injection 10 mL  . ropivacaine (PF) 2 mg/mL (0.2%) (NAROPIN) injection 10 mL  . lidocaine (PF) (XYLOCAINE) 1 % injection 5 mL  . iopamidol (ISOVUE-M) 41 % intrathecal injection 20 mL   Patient's Medications  New Prescriptions   No medications on file  Previous Medications   ALBUTEROL SULFATE 108 (90 BASE) MCG/ACT AEPB    Inhale 1-2 puffs into the lungs every 4 (four) hours as needed.   AMLODIPINE (NORVASC) 5 MG TABLET    Take 1 tablet (5 mg total) by mouth daily.   APIXABAN (ELIQUIS) 2.5 MG TABS TABLET    Take 2.5 mg by mouth 2 (two) times daily.   ATORVASTATIN (LIPITOR) 40 MG TABLET    TAKE 1 TABLET BY MOUTH EVERY DAY AT 6PM   ATORVASTATIN (LIPITOR) 40 MG TABLET    TAKE 1 TABLET BY MOUTH EVERY DAY AT 6PM   COLCHICINE (COLCRYS) 0.6 MG TABLET    Take 1 tablet (0.6 mg total) by mouth daily as needed (for gout).   DICLOFENAC SODIUM (VOLTAREN) 1 % GEL    Apply 2 g topically daily as needed (for pain).    DOFETILIDE (TIKOSYN)  125 MCG CAPSULE    Take 1 capsule (125 mcg total) by mouth 2 (two) times daily.   DOFETILIDE (TIKOSYN) 125 MCG CAPSULE    Take 125 mcg by mouth 2 (two) times daily.   DOXYCYCLINE (VIBRAMYCIN) 100 MG CAPSULE    Take 100 mg by  mouth 2 (two) times daily as needed.   DUTASTERIDE (AVODART) 0.5 MG CAPSULE    Take 0.5 mg by mouth daily as needed (for urination).    ESOMEPRAZOLE (NEXIUM) 40 MG CAPSULE    Take 40 mg by mouth daily as needed (for heartburn).    FEBUXOSTAT (ULORIC) 40 MG TABLET    Take 20 mg by mouth daily.    FLUTICASONE FUROATE-VILANTEROL (BREO ELLIPTA) 100-25 MCG/INH AEPB    Inhale 1 puff into the lungs daily.   HYDRALAZINE (APRESOLINE) 25 MG TABLET    Take 1 tablet (25 mg total) by mouth 3 (three) times daily as needed.   METOPROLOL SUCCINATE (TOPROL-XL) 50 MG 24 HR TABLET    TAKE 1 TABLET BY MOUTH DAILY. TAKE WITH OR IMMEDIATELY FOLLOWING A MEAL.   METRONIDAZOLE (FLAGYL) 500 MG TABLET    Take 500 mg by mouth See admin instructions. Take 500 mg by mouth twice daily on Sunday and Monday   PROPRANOLOL (INDERAL) 40 MG TABLET    TAKE ONE (1) TABLET THREE (3) TIMES EACHDAY AS NEEDED   SILODOSIN (RAPAFLO) 8 MG CAPS CAPSULE    Take 8 mg by mouth daily as needed (for urintation).    TERAZOSIN (HYTRIN) 1 MG CAPSULE    Take 1 mg by mouth at bedtime.   TERBINAFINE (LAMISIL) 250 MG TABLET    Take 1 tablet by mouth daily.   TESTOSTERONE CYPIONATE (DEPOTESTOSTERONE CYPIONATE) 200 MG/ML INJECTION    Inject 1 mL (200 mg total) every 28 (twenty-eight) days into the muscle.   TRAMADOL (ULTRAM) 50 MG TABLET    Take 50 mg by mouth every 6 (six) hours as needed for moderate pain.   Modified Medications   No medications on file  Discontinued Medications   No medications on file   ----------------------------------------------------------------------------------------------------------------------  Follow-up: Return in about 3 months (around 01/21/2018) for evaluation, procedure.   Procedure:   Procedure: 5 S1 LESI with fluoroscopic guidance no sedation  NOTE: The risks, benefits, and expectations of the procedure have been discussed and explained to the patient who was understanding and in agreement with suggested treatment plan. No guarantees were made.  DESCRIPTION OF PROCEDURE: Lumbar epidural steroid injection with no IV Versed, EKG, blood pressure, pulse, and pulse oximetry monitoring. The procedure was performed with the patient in the prone position under fluoroscopic guidance.  Sterile prep x3 was initiated and I then injected subcutaneous lidocaine to the overlying L5-S1 site after its fluoroscopic identifictation.  Using strict aseptic technique, I then advanced an 18-gauge Tuohy epidural needle in the midline using interlaminar approach via loss-of-resistance to saline technique. There was negative aspiration for heme or  CSF.  I then confirmed position with both AP and Lateral fluoroscan.  2 cc of Isovue were injected and a  total of 5 mL of Preservative-Free normal saline mixed with 40 mg of Kenalog and 1cc Ropicaine 0.2 percent were injected incrementally via the  epidurally placed needle. The needle was removed. The patient tolerated the injection well and was convalesced and discharged to home in stable condition. Should the patient have any post procedure difficulty they have been instructed on how to contact us for assistance.    Molli Barrows, MD

## 2017-10-22 NOTE — Telephone Encounter (Signed)
Addressed in my chart messages by the [facility] office. Then see 6/19 phone note.

## 2017-10-23 ENCOUNTER — Telehealth: Payer: Self-pay | Admitting: Cardiovascular Disease

## 2017-10-23 ENCOUNTER — Encounter: Payer: Self-pay | Admitting: Cardiovascular Disease

## 2017-10-23 ENCOUNTER — Ambulatory Visit
Admission: RE | Admit: 2017-10-23 | Discharge: 2017-10-23 | Disposition: A | Discharge status: Home / Self Care | Payer: Medicare Other | Source: Ambulatory Visit | Attending: Cardiovascular Disease | Admitting: Cardiovascular Disease

## 2017-10-23 ENCOUNTER — Encounter
Admission: RE | Disposition: A | Discharge status: Home / Self Care | Payer: Self-pay | Source: Ambulatory Visit | Attending: Cardiovascular Disease

## 2017-10-23 DIAGNOSIS — I129 Hypertensive chronic kidney disease with stage 1 through stage 4 chronic kidney disease, or unspecified chronic kidney disease: Secondary | ICD-10-CM | POA: Diagnosis not present

## 2017-10-23 DIAGNOSIS — I2510 Atherosclerotic heart disease of native coronary artery without angina pectoris: Secondary | ICD-10-CM

## 2017-10-23 DIAGNOSIS — Z7901 Long term (current) use of anticoagulants: Secondary | ICD-10-CM | POA: Diagnosis not present

## 2017-10-23 DIAGNOSIS — M109 Gout, unspecified: Secondary | ICD-10-CM | POA: Diagnosis not present

## 2017-10-23 DIAGNOSIS — I481 Persistent atrial fibrillation: Secondary | ICD-10-CM | POA: Diagnosis not present

## 2017-10-23 DIAGNOSIS — Z888 Allergy status to other drugs, medicaments and biological substances status: Secondary | ICD-10-CM | POA: Diagnosis not present

## 2017-10-23 DIAGNOSIS — K219 Gastro-esophageal reflux disease without esophagitis: Secondary | ICD-10-CM | POA: Diagnosis not present

## 2017-10-23 DIAGNOSIS — N183 Chronic kidney disease, stage 3 (moderate): Secondary | ICD-10-CM | POA: Diagnosis not present

## 2017-10-23 DIAGNOSIS — R931 Abnormal findings on diagnostic imaging of heart and coronary circulation: Secondary | ICD-10-CM | POA: Diagnosis not present

## 2017-10-23 DIAGNOSIS — Z880 Allergy status to penicillin: Secondary | ICD-10-CM | POA: Diagnosis not present

## 2017-10-23 DIAGNOSIS — Z85528 Personal history of other malignant neoplasm of kidney: Secondary | ICD-10-CM | POA: Insufficient documentation

## 2017-10-23 DIAGNOSIS — Z8673 Personal history of transient ischemic attack (TIA), and cerebral infarction without residual deficits: Secondary | ICD-10-CM | POA: Insufficient documentation

## 2017-10-23 DIAGNOSIS — I442 Atrioventricular block, complete: Secondary | ICD-10-CM | POA: Diagnosis not present

## 2017-10-23 DIAGNOSIS — I2584 Coronary atherosclerosis due to calcified coronary lesion: Secondary | ICD-10-CM | POA: Insufficient documentation

## 2017-10-23 DIAGNOSIS — G4730 Sleep apnea, unspecified: Secondary | ICD-10-CM | POA: Diagnosis not present

## 2017-10-23 DIAGNOSIS — E785 Hyperlipidemia, unspecified: Secondary | ICD-10-CM | POA: Insufficient documentation

## 2017-10-23 DIAGNOSIS — Z85828 Personal history of other malignant neoplasm of skin: Secondary | ICD-10-CM | POA: Insufficient documentation

## 2017-10-23 DIAGNOSIS — Z79899 Other long term (current) drug therapy: Secondary | ICD-10-CM | POA: Diagnosis not present

## 2017-10-23 DIAGNOSIS — Z9889 Other specified postprocedural states: Secondary | ICD-10-CM | POA: Diagnosis not present

## 2017-10-23 DIAGNOSIS — Z950 Presence of cardiac pacemaker: Secondary | ICD-10-CM | POA: Diagnosis not present

## 2017-10-23 HISTORY — PX: LEFT HEART CATH AND CORONARY ANGIOGRAPHY: CATH118249

## 2017-10-23 SURGERY — LEFT HEART CATH AND CORONARY ANGIOGRAPHY
Anesthesia: Moderate Sedation | Laterality: Left

## 2017-10-23 MED ORDER — METRONIDAZOLE 500 MG PO TABS: 500.0000 mg | ORAL_TABLET | ORAL | 5 refills | Status: DC

## 2017-10-23 MED ORDER — SODIUM CHLORIDE 0.9% FLUSH: 3.0000 mL | Freq: Two times a day (BID) | INTRAVENOUS | Status: DC

## 2017-10-23 MED ORDER — VERAPAMIL HCL 2.5 MG/ML IV SOLN
INTRAVENOUS | Status: AC
  Filled 2017-10-23: qty 2

## 2017-10-23 MED ORDER — ASPIRIN 81 MG PO CHEW: 81.0000 mg | CHEWABLE_TABLET | ORAL | Status: DC

## 2017-10-23 MED ORDER — FENTANYL CITRATE (PF) 100 MCG/2ML IJ SOLN
INTRAMUSCULAR | Status: AC
  Filled 2017-10-23: qty 2

## 2017-10-23 MED ORDER — SODIUM CHLORIDE 0.9 % IV SOLN: 250.0000 mL | INTRAVENOUS | Status: DC | PRN

## 2017-10-23 MED ORDER — ONDANSETRON HCL 4 MG/2ML IJ SOLN: 4.0000 mg | Freq: Four times a day (QID) | INTRAMUSCULAR | Status: DC | PRN

## 2017-10-23 MED ORDER — SODIUM CHLORIDE 0.9 % WEIGHT BASED INFUSION: 1.0000 mL/kg/h | INTRAVENOUS | Status: DC

## 2017-10-23 MED ORDER — SODIUM CHLORIDE 0.9 % IV SOLN: INTRAVENOUS | Status: DC

## 2017-10-23 MED ORDER — SODIUM CHLORIDE 0.9 % WEIGHT BASED INFUSION
3.0000 mL/kg/h | INTRAVENOUS | Status: AC
  Administered 2017-10-23: 3 mL/kg/h via INTRAVENOUS

## 2017-10-23 MED ORDER — IOPAMIDOL (ISOVUE-300) INJECTION 61%
INTRAVENOUS | Status: DC | PRN
  Administered 2017-10-23: 65 mL via INTRA_ARTERIAL

## 2017-10-23 MED ORDER — SODIUM CHLORIDE 0.9% FLUSH
3.0000 mL | INTRAVENOUS | Status: DC | PRN
Start: 2017-10-23 — End: 2017-10-23

## 2017-10-23 MED ORDER — ACETAMINOPHEN 325 MG PO TABS: 650.0000 mg | ORAL_TABLET | ORAL | Status: DC | PRN

## 2017-10-23 MED ORDER — FENTANYL CITRATE (PF) 100 MCG/2ML IJ SOLN
INTRAMUSCULAR | Status: DC | PRN
  Administered 2017-10-23: 25 ug via INTRAVENOUS

## 2017-10-23 MED ORDER — MIDAZOLAM HCL 2 MG/2ML IJ SOLN
INTRAMUSCULAR | Status: DC | PRN
  Administered 2017-10-23: 1 mg via INTRAVENOUS

## 2017-10-23 MED ORDER — TESTOSTERONE CYPIONATE 200 MG/ML IM SOLN: 200.0000 mg | INTRAMUSCULAR | 5 refills | Status: DC

## 2017-10-23 MED ORDER — HEPARIN SODIUM (PORCINE) 1000 UNIT/ML IJ SOLN
INTRAMUSCULAR | Status: AC
  Filled 2017-10-23: qty 1

## 2017-10-23 MED ORDER — MIDAZOLAM HCL 2 MG/2ML IJ SOLN
INTRAMUSCULAR | Status: AC
  Filled 2017-10-23: qty 2

## 2017-10-23 MED ORDER — HEPARIN SODIUM (PORCINE) 1000 UNIT/ML IJ SOLN
INTRAMUSCULAR | Status: DC | PRN
  Administered 2017-10-23: 3500 [IU] via INTRAVENOUS

## 2017-10-23 MED ORDER — LIDOCAINE HCL (PF) 1 % IJ SOLN
INTRAMUSCULAR | Status: AC
  Filled 2017-10-23: qty 30

## 2017-10-23 MED ORDER — SODIUM CHLORIDE 0.9% FLUSH: 3.0000 mL | INTRAVENOUS | Status: DC | PRN

## 2017-10-23 SURGICAL SUPPLY — 9 items
CATH INFINITI 5FR JK (CATHETERS) ×3 IMPLANT
CATH INFINITI JR4 5F (CATHETERS) ×3 IMPLANT
DEVICE RAD TR BAND REGULAR (VASCULAR PRODUCTS) ×3 IMPLANT
KIT MANI 3VAL PERCEP (MISCELLANEOUS) ×3 IMPLANT
NEEDLE PERC 21GX4CM (NEEDLE) ×3 IMPLANT
PACK CARDIAC CATH (CUSTOM PROCEDURE TRAY) ×3 IMPLANT
SHEATH RAIN RADIAL 21G 6FR (SHEATH) ×3 IMPLANT
WIRE HITORQ VERSACORE ST 145CM (WIRE) ×3 IMPLANT
WIRE ROSEN-J .035X260CM (WIRE) ×3 IMPLANT

## 2017-10-23 NOTE — Discharge Instructions (Signed)
Moderate Conscious Sedation, Adult, Care After These instructions provide you with information about caring for yourself after your procedure. Your health care provider may also give you more specific instructions. Your treatment has been planned according to current medical practices, but problems sometimes occur. Call your health care provider if you have any problems or questions after your procedure. What can I expect after the procedure? After your procedure, it is common:  To feel sleepy for several hours.  To feel clumsy and have poor balance for several hours.  To have poor judgment for several hours.  To vomit if you eat too soon.  Follow these instructions at home: For at least 24 hours after the procedure:   Do not: ? Participate in activities where you could fall or become injured. ? Drive. ? Use heavy machinery. ? Drink alcohol. ? Take sleeping pills or medicines that cause drowsiness. ? Make important decisions or sign legal documents. ? Take care of children on your own.  Rest. Eating and drinking  Follow the diet recommended by your health care provider.  If you vomit: ? Drink water, juice, or soup when you can drink without vomiting. ? Make sure you have little or no nausea before eating solid foods. General instructions  Have a responsible adult stay with you until you are awake and alert.  Take over-the-counter and prescription medicines only as told by your health care provider.  If you smoke, do not smoke without supervision.  Keep all follow-up visits as told by your health care provider. This is important. Contact a health care provider if:  You keep feeling nauseous or you keep vomiting.  You feel light-headed.  You develop a rash.  You have a fever. Get help right away if:  You have trouble breathing. This information is not intended to replace advice given to you by your health care provider. Make sure you discuss any questions you have  with your health care provider. Document Released: 02/10/2013 Document Revised: 09/25/2015 Document Reviewed: 08/12/2015 Elsevier Interactive Patient Education  2018 [place]. Femoral Site Care Refer to this sheet in the next few weeks. These instructions provide you with information about caring for yourself after your procedure. Your health care provider may also give you more specific instructions. Your treatment has been planned according to current medical practices, but problems sometimes occur. Call your health care provider if you have any problems or questions after your procedure. What can I expect after the procedure? After your procedure, it is typical to have the following:  Bruising at the site that usually fades within 1-2 weeks.  Blood collecting in the tissue (hematoma) that may be painful to the touch. It should usually decrease in size and tenderness within 1-2 weeks.  Follow these instructions at home:  Take medicines only as directed by your health care provider.  You may shower 24-48 hours after the procedure or as directed by your health care provider. Remove the bandage (dressing) and gently wash the site with plain soap and water. Pat the area dry with a clean towel. Do not rub the site, because this may cause bleeding.  Do not take baths, swim, or use a hot tub until your health care provider approves.  Check your insertion site every day for redness, swelling, or drainage.  Do not apply powder or lotion to the site.  Limit use of stairs to twice a day for the first 2-3 days or as directed by your health care provider.  Do  not squat for the first 2-3 days or as directed by your health care provider.  Do not lift over 10 lb (4.5 kg) for 5 days after your procedure or as directed by your health care provider.  Ask your health care provider when it is okay to: ? Return to work or school. ? Resume usual physical activities or sports. ? Resume sexual  activity.  Do not drive home if you are discharged the same day as the procedure. Have someone else drive you.  You may drive 24 hours after the procedure unless otherwise instructed by your health care provider.  Do not operate machinery or power tools for 24 hours after the procedure or as directed by your health care provider.  If your procedure was done as an outpatient procedure, which means that you went home the same day as your procedure, a responsible adult should be with you for the first 24 hours after you arrive home.  Keep all follow-up visits as directed by your health care provider. This is important. Contact a health care provider if:  You have a fever.  You have chills.    Resume Eliquis tomorrow.   You have increased bleeding from the site. Hold pressure on the site. Get help right away if:  You have unusual pain at the site.  You have redness, warmth, or swelling at the site.  You have drainage (other than a small amount of blood on the dressing) from the site.  The site is bleeding, and the bleeding does not stop after 30 minutes of holding steady pressure on the site.  Your leg or foot becomes pale, cool, tingly, or numb. This information is not intended to replace advice given to you by your health care provider. Make sure you discuss any questions you have with your health care provider. Document Released: 12/24/2013 Document Revised: 09/28/2015 Document Reviewed: 11/09/2013 Elsevier Interactive Patient Education  [employer].

## 2017-10-23 NOTE — Telephone Encounter (Signed)
Patient needs fu 1 week from todays cath.  Scheduled next available 7/18 with Thurmond Butts added to wait list   Jocelyn Lamer from cath lab called back to advise provider was very specific about needing 1 week fu in office. Please advise.

## 2017-10-23 NOTE — Telephone Encounter (Signed)
Nurse in special recovery states that Dr. Fletcher Anon wanted to see this patient in a week. No appointments available at this time. She states that Dr. Fletcher Anon was going to call and take care of it. Will route message over to him.

## 2017-10-23 NOTE — Progress Notes (Signed)
Patient clinically stable post heart cath per Dr Fletcher Anon. Vitals stable. V-paced on monitor. TR band on with arm elevatated on pillow. No bleeding nor hematoma noted. Denies complaints. Dr Fletcher Anon out to speak with patient and wife regarding procedure with questions answered.

## 2017-10-23 NOTE — Interval H&P Note (Signed)
Cath Lab Visit (complete for each Cath Lab visit)  Clinical Evaluation Leading to the Procedure:   ACS: No.  Non-ACS:    Anginal Classification: CCS II  Anti-ischemic medical therapy: Maximal Therapy (2 or more classes of medications)  Non-Invasive Test Results: Intermediate-risk stress test findings: cardiac mortality 1-3%/year  Prior CABG: No previous CABG      History and Physical Interval Note:  10/23/2017 8:54 AM  [name]  has presented today for surgery, with the diagnosis of LT Heart Cath   Abnormal Cardiac CT   CAD  The various methods of treatment have been discussed with the patient and family. After consideration of risks, benefits and other options for treatment, the patient has consented to  Procedure(s): LEFT HEART CATH AND CORONARY ANGIOGRAPHY (Left) as a surgical intervention .  The patient's history has been reviewed, patient examined, no change in status, stable for surgery.  I have reviewed the patient's chart and labs.  Questions were answered to the patient's satisfaction.     Thomas [name]

## 2017-10-24 NOTE — Telephone Encounter (Signed)
Left voicemail message to call back  

## 2017-10-24 NOTE — Telephone Encounter (Signed)
07/02 at 4:40 pm.

## 2017-10-27 NOTE — Telephone Encounter (Signed)
I left a message at the home # to call. I attempted to call his wife's cell #, but no voice mail set up.

## 2017-10-30 ENCOUNTER — Encounter: Payer: Self-pay | Admitting: Cardiology

## 2017-10-30 ENCOUNTER — Ambulatory Visit: Payer: Medicare Other | Attending: Internal Medicine

## 2017-10-30 ENCOUNTER — Ambulatory Visit (INDEPENDENT_AMBULATORY_CARE_PROVIDER_SITE_OTHER): Payer: Medicare Other | Admitting: *Deleted

## 2017-10-30 DIAGNOSIS — I442 Atrioventricular block, complete: Secondary | ICD-10-CM | POA: Diagnosis not present

## 2017-10-30 DIAGNOSIS — J410 Simple chronic bronchitis: Secondary | ICD-10-CM | POA: Insufficient documentation

## 2017-10-30 MED ORDER — ALBUTEROL SULFATE (2.5 MG/3ML) 0.083% IN NEBU
2.5000 mg | INHALATION_SOLUTION | Freq: Once | RESPIRATORY_TRACT | Status: AC
  Administered 2017-10-30: 2.5 mg via RESPIRATORY_TRACT
  Filled 2017-10-30: qty 3

## 2017-10-30 NOTE — Progress Notes (Signed)
Remote pacemaker transmission.   

## 2017-10-31 ENCOUNTER — Telehealth: Payer: Self-pay

## 2017-10-31 LAB — CUP PACEART REMOTE DEVICE CHECK
Brady Statistic AP VP Percent: 88.35 %
Brady Statistic AP VS Percent: 0.02 %
Brady Statistic AS VP Percent: 11.52 %
Brady Statistic AS VS Percent: 0.11 %
Brady Statistic RA Percent Paced: 84.35 %
Implantable Lead Location: 753860
Implantable Lead Model: 5076
Lead Channel Impedance Value: 342 Ohm
Lead Channel Impedance Value: 380 Ohm
Lead Channel Impedance Value: 418 Ohm
Lead Channel Pacing Threshold Amplitude: 0.375 V
Lead Channel Pacing Threshold Amplitude: 0.75 V
Lead Channel Pacing Threshold Pulse Width: 0.4 ms
Lead Channel Pacing Threshold Pulse Width: 0.4 ms
Lead Channel Sensing Intrinsic Amplitude: 1.375 mV
Lead Channel Sensing Intrinsic Amplitude: 17.875 mV
Lead Channel Sensing Intrinsic Amplitude: 17.875 mV
Lead Channel Setting Pacing Pulse Width: 0.4 ms
MDC IDC LEAD IMPLANT DT: 20180214
MDC IDC LEAD IMPLANT DT: 20180214
MDC IDC LEAD LOCATION: 753859
MDC IDC MSMT BATTERY REMAINING LONGEVITY: 127 mo
MDC IDC MSMT BATTERY VOLTAGE: 3.02 V
MDC IDC MSMT LEADCHNL RA SENSING INTR AMPL: 1.375 mV
MDC IDC MSMT LEADCHNL RV IMPEDANCE VALUE: 475 Ohm
MDC IDC PG IMPLANT DT: 20180214
MDC IDC SESS DTM: 20190627043938
MDC IDC SET LEADCHNL RA PACING AMPLITUDE: 2 V
MDC IDC SET LEADCHNL RV PACING AMPLITUDE: 2.5 V
MDC IDC SET LEADCHNL RV SENSING SENSITIVITY: 0.9 mV
MDC IDC STAT BRADY RV PERCENT PACED: 99.87 %

## 2017-10-31 NOTE — Telephone Encounter (Signed)
Spoke with patients wife per release form and confirmed scheduled appointment with Dr. Fletcher Anon. She verbalized understanding with no further questions at this time.

## 2017-10-31 NOTE — Telephone Encounter (Signed)
LMTCB and schedule AWV. Pt needs to schedule after 11/26/17. -MM

## 2017-11-03 NOTE — Progress Notes (Signed)
[facility] Consultation      Assessment and Plan:  Dyspnea on exertion with COPD/chronic bronchitis. -Now doing better after he was treated for pneumonia. -Patient is asked to do a trial off of Brio, if he notices no difference can discontinue.  Deconditioning. -Patient has a very high level of fitness, this recently was reduced after a period of inactivity, likely representing mild muscular deconditioning. - He appears to be doing better currently, continue exercise, and will monitor progress now.  Return in about 1 year (around 11/05/2018).   Date: 11/03/2017  MRN# [id number] ISAIR INABINET [birth year]    [name] is a 40 y.o. old male seen in consultation for chief complaint of:    Chief Complaint  Patient presents with  . Follow-up    pt here for f/u with results of PFT  . Shortness of Breath    almost resolved: states he just got over pneumonia.    HPI:   Dr. Madelin Headings 84 year old male referred dyspnea and cough.  At last visit it was noted that the patient had a high level of fitness as a runner, which he stopped when he developed cardiac issues.  He subsequently had a pacemaker placed with improvement of his dyspnea, but he was no longer able to do as much activity as he used to do in the past, he noted that when running the first mile he felt that his lungs were tight, then his lungs would open up but he could continue without difficulty for another 4 miles.  He was therefore started on a Brio inhaler at last visit.He had allergy tested several years ago, he does not recall any issues. 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    Since his last visit he was admitted to the hospital after a cardiac CT showed pneumonia. He was treated with abx and noted that his breathing is much better since then. He is back to running, he is doing a 15 min mile, he is not limited by breathing while doing that.   He worked as a Dealer, he still does some open  Furniture conservator/restorer.    **PFT 10/30/2017>> tracings personally reviewed, FVC 826% predicted, FEV1 is 103% predicted, there is no improvement with bronchodilator therapy, flow volume loop is normal, ratio is 63%..  TLC is 126%, RV to TLC ratio is normal.  Vital capacity is elevated her 20%.  Diffusion capacity is elevated 147%. - Overall this test shows normal pulmonary functions with possibly mild obstruction consistent with COPD. **Imaging personally view chest x-ray 07/08/16; hyperinflation consistent with emphysema.  CT chest 10/04/09, normal lungs. **Spirometry tracings personally reviewed, 07/23/17 FVC 74% predicted, FEV1 74% predicted, no bronchodilator was administered, ratio 70%.  Test is consistent with mild restrictive lung disease. **CBC 07/29/17; Abs Eos=200.   PMHX:   Past Medical History:  Diagnosis Date  . Aortic valve disorders   . Arthritis   . Atrial flutter ([place]) 06/18/2016   "AF or AFl; not sure which" (06/23/2016)  . Basal cell carcinoma    "face, nose left shoulder, left arm" (06/19/2016)  . BBB (bundle branch block)    hx right  . Chronic back pain    "neck, thoracic, lower back" (06/19/2016)  . Complete heart block ([place]) 06/2016  . Dyspnea   . GERD (gastroesophageal reflux disease)   . Heart block    "I've had type I, II Wenke before now" (06/19/2016)  . History of gout   . History  of hiatal hernia    "self dx'd" (06/19/2016)  . Hyperlipidemia   . Hypertension   . Lyme disease    "dx'd by me 2003; cx's showed dx 08/2015"  . Migraine    "3-4/year" (06/19/2016)  . Presence of permanent cardiac pacemaker 06/19/2016  . PVC's (premature ventricular contractions)   . Renal cancer, left ([place]) 2006   S/P cryotherapy  . Spinal stenosis    "cervical, 1 thoracic, lumbar" (06/19/2016)  . Squamous carcinoma    "face, nose left shoulder, left arm" (06/19/2016)  . Stroke ([place])   . TIA (transient ischemic attack) 06/14/2016   "I'm not sure that's what it was" (06/25/2016)  . Visit for  monitoring Tikosyn therapy 09/09/2017   Surgical Hx:  Past Surgical History:  Procedure Laterality Date  . ANKLE FRACTURE SURGERY Right 1967  . BASAL CELL CARCINOMA EXCISION     "face, nose left shoulder, left arm"  . BIOPSY PROSTATE  2001 & 2003  . CARDIAC CATHETERIZATION  1990's  . CARDIOVERSION N/A 09/11/2017   Procedure: CARDIOVERSION;  Surgeon: Lelon Perla, MD;  Location: [facility] ENDOSCOPY;  Service: Cardiovascular;  Laterality: N/A;  . FRACTURE SURGERY    . INGUINAL HERNIA REPAIR Left 2012  . INSERT / REPLACE / REMOVE PACEMAKER  06/19/2016  . LAPAROSCOPIC ABLATION RENAL MASS    . LEFT HEART CATH AND CORONARY ANGIOGRAPHY Left 10/23/2017   Procedure: LEFT HEART CATH AND CORONARY ANGIOGRAPHY;  Surgeon: Wellington Hampshire, MD;  Location: [place] CV LAB;  Service: Cardiovascular;  Laterality: Left;  . PACEMAKER IMPLANT N/A 06/19/2016   Procedure: Pacemaker Implant;  Surgeon: Deboraha Sprang, MD;  Location: [place] [facility] LAB;  Service: Cardiovascular;  Laterality: N/A;  . pacemasker    . PROSTATE SURGERY    . SQUAMOUS CELL CARCINOMA EXCISION     "face, nose left shoulder, left arm"  . TONSILLECTOMY AND ADENOIDECTOMY     Family Hx:  Family History  Problem Relation Age of Onset  . Heart attack Brother   . Stroke Brother   . Aortic stenosis Mother   . Arthritis Father    Social Hx:   Social History   Tobacco Use  . Smoking status: Never Smoker  . Smokeless tobacco: Never Used  Substance Use Topics  . Alcohol use: Yes    Alcohol/week: 0.0 - 1.2 oz    Comment: occasionally  . Drug use: No   Medication:    Current Outpatient Medications:  .  amLODipine (NORVASC) 5 MG tablet, Take 1 tablet (5 mg total) by mouth daily., Disp: 180 tablet, Rfl: 4 .  apixaban (ELIQUIS) 2.5 MG TABS tablet, Take 2.5 mg by mouth 2 (two) times daily., Disp: , Rfl:  .  atorvastatin (LIPITOR) 40 MG tablet, TAKE 1 TABLET BY MOUTH EVERY DAY AT 6PM (Patient not taking: Reported on 10/22/2017), Disp:  90 tablet, Rfl: 3 .  atorvastatin (LIPITOR) 40 MG tablet, TAKE 1 TABLET BY MOUTH EVERY DAY AT 6PM, Disp: 90 tablet, Rfl: 2 .  Cholecalciferol (VITAMIN D3) 5000 units CAPS, Take 5,000 Units by mouth daily., Disp: , Rfl:  .  Coenzyme Q10 (COQ10) 100 MG CAPS, Take 100 mg by mouth daily., Disp: , Rfl:  .  colchicine (COLCRYS) 0.6 MG tablet, Take 1 tablet (0.6 mg total) by mouth daily as needed (for gout)., Disp: 30 tablet, Rfl: 5 .  diclofenac sodium (VOLTAREN) 1 % GEL, Apply 2 g topically daily as needed (for pain). , Disp: , Rfl:  .  dofetilide (TIKOSYN) 125 MCG capsule, Take 1 capsule (125 mcg total) by mouth 2 (two) times daily., Disp: 180 capsule, Rfl: 1 .  doxycycline (VIBRAMYCIN) 100 MG capsule, Take 100 mg by mouth 2 (two) times daily as needed (tick bites). , Disp: , Rfl:  .  dutasteride (AVODART) 0.5 MG capsule, Take 0.5 mg by mouth daily as needed (for urination). , Disp: , Rfl:  .  esomeprazole (NEXIUM) 40 MG capsule, Take 40 mg by mouth daily as needed (for heartburn). , Disp: , Rfl:  .  febuxostat (ULORIC) 40 MG tablet, Take 20 mg by mouth daily. , Disp: , Rfl:  .  fluticasone furoate-vilanterol (BREO ELLIPTA) 100-25 MCG/INH AEPB, Inhale 1 puff into the lungs daily., Disp: 1 each, Rfl: 5 .  hydrALAZINE (APRESOLINE) 25 MG tablet, Take 1 tablet (25 mg total) by mouth 3 (three) times daily as needed. (Patient taking differently: Take 25 mg by mouth 3 (three) times daily as needed (for BP > 150). ), Disp: 90 tablet, Rfl: 6 .  metoprolol succinate (TOPROL-XL) 50 MG 24 hr tablet, TAKE 1 TABLET BY MOUTH DAILY. TAKE WITH OR IMMEDIATELY FOLLOWING A MEAL., Disp: 90 tablet, Rfl: 3 .  metroNIDAZOLE (FLAGYL) 500 MG tablet, Take 1 tablet (500 mg total) by mouth See admin instructions. Take 500 mg by mouth twice daily on Sunday and Monday, Disp: 30 tablet, Rfl: 5 .  Multiple Vitamins-Minerals (CENTRUM SILVER 50+MEN PO), Take 1 tablet by mouth daily., Disp: , Rfl:  .  propranolol (INDERAL) 40 MG tablet,  TAKE ONE (1) TABLET THREE (3) TIMES EACHDAY AS NEEDED (Patient taking differently: TAKE ONE (1) TABLET THREE (3) TIMES daily AS NEEDED for afib), Disp: 270 tablet, Rfl: 1 .  silodosin (RAPAFLO) 8 MG CAPS capsule, Take 8 mg by mouth daily as needed (for urintation). , Disp: , Rfl:  .  terazosin (HYTRIN) 1 MG capsule, Take 1 mg by mouth at bedtime., Disp: , Rfl:  .  testosterone cypionate (DEPOTESTOSTERONE CYPIONATE) 200 MG/ML injection, Inject 1 mL (200 mg total) into the muscle every 28 (twenty-eight) days., Disp: 10 mL, Rfl: 5 .  traMADol (ULTRAM) 50 MG tablet, Take 50 mg by mouth every 6 (six) hours as needed for moderate pain. , Disp: , Rfl:  .  Turmeric 500 MG CAPS, Take 500 mg by mouth daily., Disp: , Rfl:  .  vitamin E 400 UNIT capsule, Take 400 Units by mouth daily., Disp: , Rfl:    Allergies:  Iodine and Penicillins  Review of Systems: Gen:  Denies  fever, sweats, chills HEENT: Denies blurred vision, double vision. bleeds, sore throat Cvc:  No dizziness, chest pain. Resp:   Denies cough or sputum production, shortness of breath Gi: Denies swallowing difficulty, stomach pain. Gu:  Denies bladder incontinence, burning urine Ext:   No Joint pain, stiffness. Skin: No skin rash,  hives  Endoc:  No polyuria, polydipsia. Psych: No depression, insomnia. Other:  All other systems were reviewed with the patient and were negative other that what is mentioned in the HPI.   Physical Examination:   VS: BP 136/88 (BP Location: Left Arm, Cuff Size: Normal)   Pulse 85   Resp 16   Ht 5\' 7"  (1.702 m)   Wt 166 lb (75.3 kg)   SpO2 93%   BMI 26.00 kg/m   General Appearance: No distress  Neuro:without focal findings,  speech normal,  HEENT: PERRLA, EOM intact.   Pulmonary: normal breath sounds, No wheezing.  CardiovascularNormal S1,S2.  No m/r/g.  Abdomen: Benign, Soft, non-tender. Renal:  No costovertebral tenderness  GU:  No performed at this time. Endoc: No evident thyromegaly, no  signs of acromegaly. Skin:   warm, no rashes, no ecchymosis  Extremities: normal, no cyanosis, clubbing.  Other findings:    LABORATORY PANEL:   CBC No results for input(s): WBC, HGB, HCT, PLT in the last 168 hours. ------------------------------------------------------------------------------------------------------------------  Chemistries  No results for input(s): NA, K, CL, CO2, GLUCOSE, BUN, CREATININE, CALCIUM, MG, AST, ALT, ALKPHOS, BILITOT in the last 168 hours.  Invalid input(s): GFRCGP ------------------------------------------------------------------------------------------------------------------  Cardiac Enzymes No results for input(s): TROPONINI in the last 168 hours. ------------------------------------------------------------  RADIOLOGY:  No results found.     Thank  you for the consultation and for allowing [facility] to assist in the care of your patient. Our recommendations are noted above.  Please contact us if we can be of further service.   Marda Stalker, M.D., F.C.C.P.  Board Certified in Internal Medicine, Pulmonary Medicine, [place], and Sleep Medicine.  [facility] Office Number: [phone]   11/03/2017

## 2017-11-04 ENCOUNTER — Ambulatory Visit (INDEPENDENT_AMBULATORY_CARE_PROVIDER_SITE_OTHER): Payer: Medicare Other | Admitting: Cardiovascular Disease

## 2017-11-04 ENCOUNTER — Encounter: Payer: Self-pay | Admitting: Internal Medicine

## 2017-11-04 ENCOUNTER — Ambulatory Visit: Payer: Medicare Other | Admitting: Internal Medicine

## 2017-11-04 ENCOUNTER — Encounter: Payer: Self-pay | Admitting: Cardiovascular Disease

## 2017-11-04 VITALS — BP 136/88 | HR 85 | Resp 16 | Ht 67.0 in | Wt 166.0 lb

## 2017-11-04 VITALS — BP 120/70 | HR 74 | Ht 67.0 in | Wt 164.2 lb

## 2017-11-04 DIAGNOSIS — I4819 Other persistent atrial fibrillation: Secondary | ICD-10-CM

## 2017-11-04 DIAGNOSIS — I10 Essential (primary) hypertension: Secondary | ICD-10-CM | POA: Diagnosis not present

## 2017-11-04 DIAGNOSIS — E782 Mixed hyperlipidemia: Secondary | ICD-10-CM | POA: Diagnosis not present

## 2017-11-04 DIAGNOSIS — I2510 Atherosclerotic heart disease of native coronary artery without angina pectoris: Secondary | ICD-10-CM | POA: Diagnosis not present

## 2017-11-04 DIAGNOSIS — J449 Chronic obstructive pulmonary disease, unspecified: Secondary | ICD-10-CM | POA: Diagnosis not present

## 2017-11-04 DIAGNOSIS — I481 Persistent atrial fibrillation: Secondary | ICD-10-CM

## 2017-11-04 DIAGNOSIS — M545 Low back pain, unspecified: Secondary | ICD-10-CM | POA: Insufficient documentation

## 2017-11-04 NOTE — Patient Instructions (Signed)
You can do a trial off the breo and see if you notice a difference, if you're breathing is worse you can restart Breo.

## 2017-11-04 NOTE — Telephone Encounter (Signed)
Spoke with wife who states she will have pt CB once he is home.  -MM

## 2017-11-04 NOTE — Patient Instructions (Signed)
Medication Instructions: Your physician recommends that you continue on your current medications as directed. Please refer to the Current Medication list given to you today.  If you need a refill on your cardiac medications before your next appointment, please call your pharmacy.   Follow-Up: Your physician wants you to follow-up in 2 months with Dr. Fletcher Anon.   Thank you for choosing [facility] at [facility]!

## 2017-11-04 NOTE — Progress Notes (Signed)
Cardiology Office Note   Date:  11/04/2017   ID:  [name] Thomas, DOB 30-Jul-1932, MRN [id number]  PCP:  Thomas [name]., MD  Cardiologist:   Dr. Rockey Situ  Chief Complaint  Patient presents with  . OTHER    S/p cardiac cath. Meds reviewed verbally with pt.      History of Present Illness: Thomas [name] is a 84 y.o. male who presents for a follow-up visit regarding coronary artery disease.  He has known history of pacemaker placement in February 2018 for complete heart block, paroxysmal atrial flutter on anticoagulation with chads vas score of 6 with prior TIA, essential hypertension and hyperlipidemia.  He had shortness of breath in May when he had atrial arrhythmia.  He was started on Tikosyn with subsequent improvement.  He did have coronary CTA done which showed a calcium score of 1366 with possible significant stenosis in the mid LAD and 50% stenosis in the mid left circumflex.  The RCA could not be visualized due to pacemaker artifact. I proceeded with cardiac catheterization recently which showed borderline three-vessel coronary artery disease with 60% mid LAD stenosis, 50% proximal left circumflex stenosis and 70% mid RCA stenosis.  The coronary arteries were noted to be moderately calcified.  LVEDP was 18 mmHg.  No revascularization was performed.  He reports no chest discomfort at the present time.  His shortness of breath has improved after treating his atrial arrhythmia and he is back running 2 miles a day.  He does not feel significantly limited but he wants to build up his conditioning again.    Past Medical History:  Diagnosis Date  . Aortic valve disorders   . Arthritis   . Atrial flutter ([place]) 06/18/2016   "AF or AFl; not sure which" (06/23/2016)  . Basal cell carcinoma    "face, nose left shoulder, left arm" (06/19/2016)  . BBB (bundle branch block)    hx right  . Chronic back pain    "neck, thoracic, lower back" (06/19/2016)  . Complete heart block ([place])  06/2016  . Dyspnea   . GERD (gastroesophageal reflux disease)   . Heart block    "I've had type I, II Wenke before now" (06/19/2016)  . History of gout   . History of hiatal hernia    "self dx'd" (06/19/2016)  . Hyperlipidemia   . Hypertension   . Lyme disease    "dx'd by me 2003; cx's showed dx 08/2015"  . Migraine    "3-4/year" (06/19/2016)  . Presence of permanent cardiac pacemaker 06/19/2016  . PVC's (premature ventricular contractions)   . Renal cancer, left ([place]) 2006   S/P cryotherapy  . Spinal stenosis    "cervical, 1 thoracic, lumbar" (06/19/2016)  . Squamous carcinoma    "face, nose left shoulder, left arm" (06/19/2016)  . Stroke ([place])   . TIA (transient ischemic attack) 06/14/2016   "I'm not sure that's what it was" (06/25/2016)  . Visit for monitoring Tikosyn therapy 09/09/2017    Past Surgical History:  Procedure Laterality Date  . ANKLE FRACTURE SURGERY Right 1967  . BASAL CELL CARCINOMA EXCISION     "face, nose left shoulder, left arm"  . BIOPSY PROSTATE  2001 & 2003  . CARDIAC CATHETERIZATION  1990's  . CARDIOVERSION N/A 09/11/2017   Procedure: CARDIOVERSION;  Surgeon: Lelon Perla, MD;  Location: [facility] ENDOSCOPY;  Service: Cardiovascular;  Laterality: N/A;  . FRACTURE SURGERY    . INGUINAL HERNIA REPAIR Left 2012  .  INSERT / REPLACE / REMOVE PACEMAKER  06/19/2016  . LAPAROSCOPIC ABLATION RENAL MASS    . LEFT HEART CATH AND CORONARY ANGIOGRAPHY Left 10/23/2017   Procedure: LEFT HEART CATH AND CORONARY ANGIOGRAPHY;  Surgeon: Wellington Hampshire, MD;  Location: [place] CV LAB;  Service: Cardiovascular;  Laterality: Left;  . PACEMAKER IMPLANT N/A 06/19/2016   Procedure: Pacemaker Implant;  Surgeon: Deboraha Sprang, MD;  Location: [place] [facility] LAB;  Service: Cardiovascular;  Laterality: N/A;  . pacemasker    . PROSTATE SURGERY    . SQUAMOUS CELL CARCINOMA EXCISION     "face, nose left shoulder, left arm"  . TONSILLECTOMY AND ADENOIDECTOMY       Current  Outpatient Medications  Medication Sig Dispense Refill  . amLODipine (NORVASC) 5 MG tablet Take 1 tablet (5 mg total) by mouth daily. 180 tablet 4  . apixaban (ELIQUIS) 2.5 MG TABS tablet Take 2.5 mg by mouth 2 (two) times daily.    Marland Kitchen atorvastatin (LIPITOR) 40 MG tablet TAKE 1 TABLET BY MOUTH EVERY DAY AT 6PM 90 tablet 2  . Cholecalciferol (VITAMIN D3) 5000 units CAPS Take 5,000 Units by mouth daily.    . Coenzyme Q10 (COQ10) 100 MG CAPS Take 100 mg by mouth daily.    . colchicine (COLCRYS) 0.6 MG tablet Take 1 tablet (0.6 mg total) by mouth daily as needed (for gout). 30 tablet 5  . diclofenac sodium (VOLTAREN) 1 % GEL Apply 2 g topically daily as needed (for pain).     Marland Kitchen dofetilide (TIKOSYN) 125 MCG capsule Take 1 capsule (125 mcg total) by mouth 2 (two) times daily. 180 capsule 1  . dutasteride (AVODART) 0.5 MG capsule Take 0.5 mg by mouth daily as needed (for urination).     Marland Kitchen esomeprazole (NEXIUM) 40 MG capsule Take 40 mg by mouth daily as needed (for heartburn).     . febuxostat (ULORIC) 40 MG tablet Take 20 mg by mouth daily.     . fluticasone furoate-vilanterol (BREO ELLIPTA) 100-25 MCG/INH AEPB Inhale 1 puff into the lungs daily. 1 each 5  . hydrALAZINE (APRESOLINE) 25 MG tablet Take 1 tablet (25 mg total) by mouth 3 (three) times daily as needed. (Patient taking differently: Take 25 mg by mouth 3 (three) times daily as needed (for BP > 150). ) 90 tablet 6  . metoprolol succinate (TOPROL-XL) 50 MG 24 hr tablet TAKE 1 TABLET BY MOUTH DAILY. TAKE WITH OR IMMEDIATELY FOLLOWING A MEAL. 90 tablet 3  . metroNIDAZOLE (FLAGYL) 500 MG tablet Take 1 tablet (500 mg total) by mouth See admin instructions. Take 500 mg by mouth twice daily on Sunday and Monday 30 tablet 5  . Multiple Vitamins-Minerals (CENTRUM SILVER 50+MEN PO) Take 1 tablet by mouth daily.    . propranolol (INDERAL) 40 MG tablet TAKE ONE (1) TABLET THREE (3) TIMES EACHDAY AS NEEDED (Patient taking differently: TAKE ONE (1) TABLET THREE  (3) TIMES daily AS NEEDED for afib) 270 tablet 1  . silodosin (RAPAFLO) 8 MG CAPS capsule Take 8 mg by mouth daily as needed (for urintation).     . terazosin (HYTRIN) 1 MG capsule Take 1 mg by mouth at bedtime.    Marland Kitchen testosterone cypionate (DEPOTESTOSTERONE CYPIONATE) 200 MG/ML injection Inject 1 mL (200 mg total) into the muscle every 28 (twenty-eight) days. 10 mL 5  . traMADol (ULTRAM) 50 MG tablet Take 50 mg by mouth every 6 (six) hours as needed for moderate pain.     . Turmeric  500 MG CAPS Take 500 mg by mouth daily.    . vitamin E 400 UNIT capsule Take 400 Units by mouth daily.     No current facility-administered medications for this visit.     Allergies:   Iodine and Penicillins    Social History:  The patient  reports that he has never smoked. He has never used smokeless tobacco. He reports that he drinks alcohol. He reports that he does not use drugs.   Family History:  The patient's family history includes Aortic stenosis in his mother; Arthritis in his father; Heart attack in his brother; Stroke in his brother.    ROS:  Please see the history of present illness.   Otherwise, review of systems are positive for none.   All other systems are reviewed and negative.    PHYSICAL EXAM: VS:  BP 120/70 (BP Location: Left Arm, Patient Position: Sitting, Cuff Size: Normal)   Pulse 74   Ht 5\' 7"  (1.702 m)   Wt 164 lb 4 oz (74.5 kg)   BMI 25.73 kg/m  , BMI Body mass index is 25.73 kg/m. GEN: Well nourished, well developed, in no acute distress  HEENT: normal  Neck: no JVD, carotid bruits, or masses Cardiac: RRR; no murmurs, rubs, or gallops,no edema  Respiratory:  clear to auscultation bilaterally, normal work of breathing GI: soft, nontender, nondistended, + BS MS: no deformity or atrophy  Skin: warm and dry, no rash Neuro:  Strength and sensation are intact Psych: euthymic mood, full affect Right radial pulses normal with no hematoma.  EKG:  EKG is ordered today. The ekg  ordered today demonstrates ventricular paced rhythm with underlying sinus rhythm.   Recent Labs: 07/29/2017: ALT 31; TSH 2.840 09/22/2017: Magnesium 2.0 10/22/2017: BUN 33; Creatinine, Ser 1.52; Hemoglobin 16.0; Platelets 158; Potassium 4.3; Sodium 134    Lipid Panel    Component Value Date/Time   CHOL 119 07/11/2016 1201   CHOL 144 06/26/2012 1106   TRIG 39 07/11/2016 1201   TRIG 46 06/26/2012 1106   HDL 53 07/11/2016 1201   HDL 61 (H) 06/26/2012 1106   CHOLHDL 2.4 06/14/2016 1346   VLDL 8 06/14/2016 1346   VLDL 9 06/26/2012 1106   LDLCALC 58 07/11/2016 1201   LDLCALC 74 06/26/2012 1106      Wt Readings from Last 3 Encounters:  11/04/17 164 lb 4 oz (74.5 kg)  11/04/17 166 lb (75.3 kg)  10/23/17 164 lb (74.4 kg)       No flowsheet data found.    ASSESSMENT AND PLAN:  1.  Coronary artery disease involving native coronary arteries without angina: The cardiac catheterization was discussed in details with him.  I discussed with him the indications for revascularization including symptoms.  He seems to be mostly asymptomatic at the present time.  He has resumed his exercise program and has been running 2 miles per day.  He wants to build up his conditioning and we will closely monitor his symptoms.  If he is not able to get back to his baseline functioning, we can consider proceeding with PCI to the RCA as well as the LAD.  We have to keep in mind that he does have chronic kidney disease and also he is on long-term anticoagulation for atrial fibrillation.  2.  Persistent atrial fibrillation: Maintaining in sinus rhythm with Tikosyn.  He is on anticoagulation with Eliquis.  3.  Essential hypertension: Blood pressure is controlled.  4.  Hyperlipidemia: Continue treatment with atorvastatin.  Most recent LDL was 58.   Disposition:   FU with me in 2 months  Signed,  [name], MD  11/04/2017 4:47 PM    [place]

## 2017-11-07 NOTE — Telephone Encounter (Signed)
Scheduled AWV for 11/27/17 @ 10:40 AM. -MM

## 2017-11-13 ENCOUNTER — Ambulatory Visit (INDEPENDENT_AMBULATORY_CARE_PROVIDER_SITE_OTHER): Payer: Medicare Other | Admitting: Family Medicine

## 2017-11-13 ENCOUNTER — Encounter: Payer: Self-pay | Admitting: Family Medicine

## 2017-11-13 VITALS — BP 128/64 | HR 68 | Temp 97.9°F | Resp 16 | Ht 67.0 in | Wt 166.0 lb

## 2017-11-13 DIAGNOSIS — N401 Enlarged prostate with lower urinary tract symptoms: Secondary | ICD-10-CM

## 2017-11-13 DIAGNOSIS — J189 Pneumonia, unspecified organism: Secondary | ICD-10-CM | POA: Diagnosis not present

## 2017-11-13 DIAGNOSIS — E7800 Pure hypercholesterolemia, unspecified: Secondary | ICD-10-CM | POA: Diagnosis not present

## 2017-11-13 DIAGNOSIS — I4891 Unspecified atrial fibrillation: Secondary | ICD-10-CM | POA: Diagnosis not present

## 2017-11-13 DIAGNOSIS — E291 Testicular hypofunction: Secondary | ICD-10-CM | POA: Diagnosis not present

## 2017-11-13 DIAGNOSIS — R5383 Other fatigue: Secondary | ICD-10-CM

## 2017-11-13 DIAGNOSIS — N138 Other obstructive and reflux uropathy: Secondary | ICD-10-CM

## 2017-11-13 MED ORDER — METRONIDAZOLE 500 MG PO TABS: 500.0000 mg | ORAL_TABLET | ORAL | 11 refills | Status: DC

## 2017-11-13 MED ORDER — DOXYCYCLINE HYCLATE 100 MG PO TABS: 100.0000 mg | ORAL_TABLET | Freq: Two times a day (BID) | ORAL | 4 refills | Status: DC

## 2017-11-13 NOTE — Progress Notes (Signed)
Patient: Thomas [name] Male    DOB: Dec 28, 1932   84 y.o.   MRN: [id number] Visit Date: 11/13/2017  Today's Provider: [name], MD   Chief Complaint  Patient presents with  . Hospitalization Follow-up   Subjective:    HPI  Follow up Hospitalization  Patient was admitted to [facility] on 09/09/2017 and discharged on 09/15/2017.  The patient was admitted and Tikosyn was initiated.  Renal function and electrolytes were followed during  the hospitalization. During admission, CT was concerning for pneumonia.  Pulmonary was consulted  and antibiotics were initiated.   Patient reports that he is feeling much better. He was seen by Cardiology on 11/04/17 and no changes were made.     Allergies  Allergen Reactions  . Iodine Hives and Other (See Comments)    IVP contrast  . Penicillins Itching, Rash and Other (See Comments)    ITCHY FEELING IN FINGERS Has patient had a PCN reaction causing immediate rash, facial/tongue/throat swelling, SOB or lightheadedness with hypotension: No Has patient had a PCN reaction causing severe rash involving mucus membranes or skin necrosis: No Has patient had a PCN reaction that required hospitalization No Has patient had a PCN reaction occurring within the last 10 years: No If all of the above answers are "NO", then may proceed with Cephalosporin use.      Current Outpatient Medications:  .  amLODipine (NORVASC) 5 MG tablet, Take 1 tablet (5 mg total) by mouth daily., Disp: 180 tablet, Rfl: 4 .  apixaban (ELIQUIS) 2.5 MG TABS tablet, Take 2.5 mg by mouth 2 (two) times daily., Disp: , Rfl:  .  atorvastatin (LIPITOR) 40 MG tablet, TAKE 1 TABLET BY MOUTH EVERY DAY AT 6PM, Disp: 90 tablet, Rfl: 2 .  Cholecalciferol (VITAMIN D3) 5000 units CAPS, Take 5,000 Units by mouth daily., Disp: , Rfl:  .  Coenzyme Q10 (COQ10) 100 MG CAPS, Take 100 mg by mouth daily., Disp: , Rfl:  .  colchicine (COLCRYS) 0.6 MG tablet, Take 1 tablet (0.6 mg total) by mouth  daily as needed (for gout)., Disp: 30 tablet, Rfl: 5 .  diclofenac sodium (VOLTAREN) 1 % GEL, Apply 2 g topically daily as needed (for pain). , Disp: , Rfl:  .  dofetilide (TIKOSYN) 125 MCG capsule, Take 1 capsule (125 mcg total) by mouth 2 (two) times daily., Disp: 180 capsule, Rfl: 1 .  dutasteride (AVODART) 0.5 MG capsule, Take 0.5 mg by mouth daily as needed (for urination). , Disp: , Rfl:  .  esomeprazole (NEXIUM) 40 MG capsule, Take 40 mg by mouth daily as needed (for heartburn). , Disp: , Rfl:  .  febuxostat (ULORIC) 40 MG tablet, Take 20 mg by mouth daily. , Disp: , Rfl:  .  fluticasone furoate-vilanterol (BREO ELLIPTA) 100-25 MCG/INH AEPB, Inhale 1 puff into the lungs daily., Disp: 1 each, Rfl: 5 .  hydrALAZINE (APRESOLINE) 25 MG tablet, Take 1 tablet (25 mg total) by mouth 3 (three) times daily as needed. (Patient taking differently: Take 25 mg by mouth 3 (three) times daily as needed (for BP > 150). ), Disp: 90 tablet, Rfl: 6 .  metoprolol succinate (TOPROL-XL) 50 MG 24 hr tablet, TAKE 1 TABLET BY MOUTH DAILY. TAKE WITH OR IMMEDIATELY FOLLOWING A MEAL., Disp: 90 tablet, Rfl: 3 .  metroNIDAZOLE (FLAGYL) 500 MG tablet, Take 1 tablet (500 mg total) by mouth See admin instructions. Take 500 mg by mouth twice daily on Sunday and Monday, Disp: 30  tablet, Rfl: 5 .  Multiple Vitamins-Minerals (CENTRUM SILVER 50+MEN PO), Take 1 tablet by mouth daily., Disp: , Rfl:  .  propranolol (INDERAL) 40 MG tablet, TAKE ONE (1) TABLET THREE (3) TIMES EACHDAY AS NEEDED (Patient taking differently: TAKE ONE (1) TABLET THREE (3) TIMES daily AS NEEDED for afib), Disp: 270 tablet, Rfl: 1 .  silodosin (RAPAFLO) 8 MG CAPS capsule, Take 8 mg by mouth daily as needed (for urintation). , Disp: , Rfl:  .  terazosin (HYTRIN) 1 MG capsule, Take 1 mg by mouth at bedtime., Disp: , Rfl:  .  testosterone cypionate (DEPOTESTOSTERONE CYPIONATE) 200 MG/ML injection, Inject 1 mL (200 mg total) into the muscle every 28 (twenty-eight)  days., Disp: 10 mL, Rfl: 5 .  traMADol (ULTRAM) 50 MG tablet, Take 50 mg by mouth every 6 (six) hours as needed for moderate pain. , Disp: , Rfl:  .  Turmeric 500 MG CAPS, Take 500 mg by mouth daily., Disp: , Rfl:  .  vitamin E 400 UNIT capsule, Take 400 Units by mouth daily., Disp: , Rfl:   Review of Systems  Constitutional: Negative for activity change, appetite change, chills, diaphoresis, fatigue, fever and unexpected weight change.  Eyes: Negative.   Respiratory: Negative for cough and shortness of breath.   Cardiovascular: Negative for chest pain, palpitations and leg swelling.  Gastrointestinal: Negative.   Endocrine: Negative for cold intolerance, heat intolerance, polydipsia, polyphagia and polyuria.  Genitourinary: Negative.   Musculoskeletal: Negative for arthralgias.  Allergic/Immunologic: Negative.   Neurological: Negative for dizziness and light-headedness.  Hematological: Negative.   Psychiatric/Behavioral: Negative.     Social History   Tobacco Use  . Smoking status: Never Smoker  . Smokeless tobacco: Never Used  Substance Use Topics  . Alcohol use: Yes    Alcohol/week: 0.0 - 1.2 oz    Comment: occasionally   Objective:   BP 128/64   Pulse 68   Temp 97.9 F (36.6 C)   Resp 16   Ht 5\' 7"  (1.702 m)   Wt 166 lb (75.3 kg)   SpO2 97%   BMI 26.00 kg/m  Vitals:   11/13/17 1135  BP: 128/64  Pulse: 68  Resp: 16  Temp: 97.9 F (36.6 C)  SpO2: 97%  Weight: 166 lb (75.3 kg)  Height: 5\' 7"  (1.702 m)     Physical Exam  Constitutional: He appears well-developed and well-nourished.  HENT:  Head: Normocephalic and atraumatic.  Right Ear: External ear normal.  Left Ear: External ear normal.  Nose: Nose normal.  Mouth/Throat: Oropharynx is clear and moist.  Eyes: Conjunctivae are normal. No scleral icterus.  Cardiovascular: Normal rate, regular rhythm, normal heart sounds and intact distal pulses.  Pulmonary/Chest: Effort normal and breath sounds normal.    Abdominal: Soft.  Musculoskeletal: He exhibits no edema.  Neurological: He is alert.  Skin: Skin is warm and dry.  Psychiatric: He has a normal mood and affect. His behavior is normal. Judgment and thought content normal.        Assessment & Plan:     1. Atrial fibrillation, unspecified type ([place])  - EKG 12-Lead  2. Pneumonia due to infectious organism, unspecified laterality, unspecified part of lung  - DG Chest 2 View; Future - CBC with Differential/Platelet  3. Other fatigue  - TSH  4. Hypogonadism in male  - CK - Testosterone  5. Pure hypercholesterolemia  - Comprehensive metabolic panel - Lipid panel  6. Benign prostatic hyperplasia with urinary obstruction  - PSA 7.Chronic  Lymes Disease Pt has been followed by specialist who is moving away. He will look for another specialist to treat this For now we will refill Flagyl500mg  which he takes twice a week.Also refill Doxycycline.     I have done the exam and reviewed the above chart and it is accurate to the best of my knowledge. Development worker, community has been used in this note in any air is in the dictation or transcription are unintentional.  [name], MD  [place]

## 2017-11-14 ENCOUNTER — Telehealth: Payer: Self-pay | Admitting: Cardiovascular Disease

## 2017-11-14 NOTE — Telephone Encounter (Signed)
Called [name] and Thomas [name] saw Dr. Fletcher Anon on 7/2 If [name] states he is doing fine and does not feel 7/18 appointment necessary, cancel appointment on 7/18 with R. Dunn and have him schedule his 2 month follow up in September with Dr. Fletcher Anon

## 2017-11-18 NOTE — Telephone Encounter (Signed)
Pt spouse called and cancelled appointment Pt was just seen  Nothing else needed.

## 2017-11-20 ENCOUNTER — Encounter

## 2017-11-20 ENCOUNTER — Ambulatory Visit: Payer: Medicare Other | Admitting: Physician Assistant

## 2017-11-21 ENCOUNTER — Ambulatory Visit
Admission: RE | Admit: 2017-11-21 | Discharge: 2017-11-21 | Disposition: A | Discharge status: Home / Self Care | Payer: Medicare Other | Source: Ambulatory Visit | Attending: Family Medicine | Admitting: Family Medicine

## 2017-11-21 ENCOUNTER — Ambulatory Visit: Admission: RE | Admit: 2017-11-21 | Payer: Medicare Other | Source: Ambulatory Visit | Admitting: Family Medicine

## 2017-11-21 DIAGNOSIS — Z950 Presence of cardiac pacemaker: Secondary | ICD-10-CM | POA: Insufficient documentation

## 2017-11-21 DIAGNOSIS — E291 Testicular hypofunction: Secondary | ICD-10-CM | POA: Diagnosis not present

## 2017-11-21 DIAGNOSIS — J181 Lobar pneumonia, unspecified organism: Secondary | ICD-10-CM | POA: Diagnosis not present

## 2017-11-21 DIAGNOSIS — R911 Solitary pulmonary nodule: Secondary | ICD-10-CM | POA: Diagnosis not present

## 2017-11-21 DIAGNOSIS — I517 Cardiomegaly: Secondary | ICD-10-CM | POA: Insufficient documentation

## 2017-11-21 DIAGNOSIS — N138 Other obstructive and reflux uropathy: Secondary | ICD-10-CM | POA: Diagnosis not present

## 2017-11-21 DIAGNOSIS — J189 Pneumonia, unspecified organism: Secondary | ICD-10-CM | POA: Diagnosis not present

## 2017-11-21 DIAGNOSIS — N401 Enlarged prostate with lower urinary tract symptoms: Secondary | ICD-10-CM | POA: Diagnosis not present

## 2017-11-21 DIAGNOSIS — E7800 Pure hypercholesterolemia, unspecified: Secondary | ICD-10-CM | POA: Diagnosis not present

## 2017-11-22 LAB — CBC WITH DIFFERENTIAL/PLATELET
BASOS ABS: 0 10*3/uL (ref 0.0–0.2)
Basos: 0 %
EOS (ABSOLUTE): 0.1 10*3/uL (ref 0.0–0.4)
Eos: 2 %
HEMOGLOBIN: 16.2 g/dL (ref 13.0–17.7)
Hematocrit: 50.5 % (ref 37.5–51.0)
IMMATURE GRANS (ABS): 0 10*3/uL (ref 0.0–0.1)
Immature Granulocytes: 0 %
LYMPHS: 34 %
Lymphocytes Absolute: 1.6 10*3/uL (ref 0.7–3.1)
MCH: 29.2 pg (ref 26.6–33.0)
MCHC: 32.1 g/dL (ref 31.5–35.7)
MCV: 91 fL (ref 79–97)
MONOCYTES: 16 %
Monocytes Absolute: 0.7 10*3/uL (ref 0.1–0.9)
NEUTROS ABS: 2.3 10*3/uL (ref 1.4–7.0)
Neutrophils: 48 %
PLATELETS: 158 10*3/uL (ref 150–450)
RBC: 5.54 x10E6/uL (ref 4.14–5.80)
RDW: 15.2 % (ref 12.3–15.4)
WBC: 4.8 10*3/uL (ref 3.4–10.8)

## 2017-11-22 LAB — COMPREHENSIVE METABOLIC PANEL
ALK PHOS: 63 IU/L (ref 39–117)
ALT: 23 IU/L (ref 0–44)
AST: 25 IU/L (ref 0–40)
Albumin/Globulin Ratio: 1.8 (ref 1.2–2.2)
Albumin: 3.9 g/dL (ref 3.5–4.7)
BILIRUBIN TOTAL: 0.7 mg/dL (ref 0.0–1.2)
BUN/Creatinine Ratio: 13 (ref 10–24)
BUN: 20 mg/dL (ref 8–27)
CO2: 22 mmol/L (ref 20–29)
CREATININE: 1.55 mg/dL — AB (ref 0.76–1.27)
Calcium: 8.9 mg/dL (ref 8.6–10.2)
Chloride: 100 mmol/L (ref 96–106)
GFR, EST AFRICAN AMERICAN: 47 mL/min/{1.73_m2} — AB (ref >59)
GFR, EST NON AFRICAN AMERICAN: 41 mL/min/{1.73_m2} — AB (ref >59)
GLUCOSE: 93 mg/dL (ref 65–99)
Globulin, Total: 2.2 g/dL (ref 1.5–4.5)
Potassium: 4.3 mmol/L (ref 3.5–5.2)
Sodium: 138 mmol/L (ref 134–144)
TOTAL PROTEIN: 6.1 g/dL (ref 6.0–8.5)

## 2017-11-22 LAB — LIPID PANEL
CHOLESTEROL TOTAL: 132 mg/dL (ref 100–199)
Chol/HDL Ratio: 2.2 ratio (ref 0.0–5.0)
HDL: 61 mg/dL (ref >39)
LDL CALC: 62 mg/dL (ref 0–99)
TRIGLYCERIDES: 43 mg/dL (ref 0–149)
VLDL CHOLESTEROL CAL: 9 mg/dL (ref 5–40)

## 2017-11-22 LAB — CK: Total CK: 66 U/L (ref 24–204)

## 2017-11-22 LAB — TSH: TSH: 2.26 u[IU]/mL (ref 0.450–4.500)

## 2017-11-22 LAB — TESTOSTERONE: Testosterone: 197 ng/dL — ABNORMAL LOW (ref 264–916)

## 2017-11-22 LAB — PSA: Prostate Specific Ag, Serum: 5.5 ng/mL — ABNORMAL HIGH (ref 0.0–4.0)

## 2017-11-24 ENCOUNTER — Telehealth: Payer: Self-pay

## 2017-11-24 NOTE — Telephone Encounter (Signed)
Called Mr. Shankman in regards to one of his medications.   He was unavailable at the time.  A message was left to have patient return call for more information.  Madlyn Frankel PharmD Candidate

## 2017-11-24 NOTE — Telephone Encounter (Signed)
-----   Message from Jerrol Banana., MD sent at 11/24/2017 10:49 AM EDT ----- Regarding: RE: [place] Gout Initiative  Please tell Dr [name] about this issue and would recommend he stop uloric and we will see how gout does. ----- Message ----- From: Dustin Flock Sent: 11/24/2017  10:19 AM To: Jerrol Banana., MD, # Subject: [place] Gout Initiative                    Hi Dr. Rosanna Randy,   I am contacting you on behalf of a medication initiative approved by [place]. Your patient, Mr. Kaps, is on febuxostat (Uloric) and has cardiovascular disease CAD, a.fib, previous heart block. The FDA released a new black box warning for febuxostat regarding a higher rate of CV deaths in patients with CVD.   The black box warning suggests that febuxostat be reserved in patients with inadequate response to maximally titrated allopurinol (Zyloprim) or patients with a known intolerance or contraindication to allopurinol.     Please consider the following:  - Discussing risks vs. benefit with patient - Switching from febuxostat to allopurinol (CrCl <73mL/min- can initiate at 50mg  and titrate with additional monitoring for side effects/toxicity- ACR) - Potential for lifestyle management options  - Other agents that may lower uric acid levels based on your patient's coexisting conditions: losartan, fenofibrate, calcium channel blockers (already prescribed, great!)  Please contact me if assistance may be needed with facilitating potential changes or educating the patient.   Thank you for your consideration.

## 2017-11-24 NOTE — Telephone Encounter (Signed)
[facility] ED   Called Mr. Woodberry in regards to one of his medications.   He was unavailable at the time.  A message was left to have patient return call for more information.  Madlyn Frankel PharmD Candidate

## 2017-11-25 ENCOUNTER — Telehealth: Payer: Self-pay

## 2017-11-25 NOTE — Telephone Encounter (Signed)
Mr. Tenbrink returned call today about his medication Uloric.   He was notified about the new [place] Gout Initiative and the risk for increased CV events/death in patients with CV disease using Uloric for gout management.   Relayed his PCP wants him to stop taking Uloric for the time being and see how his gout is managed without the medication. He expressed concern about the future, if he has a gout attack, that he would want something to help with his gout because the attacks are painful.  I assured him that we would notify his PCP about his concern and notify him about alternatives available for gout management.   Patient was agreeable to hold uloric for now and he would reach out to his PCP with any concerns. He had no further questions today.

## 2017-11-25 NOTE — Telephone Encounter (Signed)
Patient is returning Elena's call regarding medication questions. CB# [phone]

## 2017-11-25 NOTE — Telephone Encounter (Signed)
Thank you for relaying this information.  Per Dr Alben Spittle note his plan was to address that with the patient as it came up.   Thanks Jiles Garter  ----- Message from Caroline More, Student-PharmD sent at 11/25/2017  9:23 AM EDT ----- Regarding: RE: [place] Gout Initiative  Dr. Rosanna Randy,  I was able to talk with Dr. Madelin Headings today. He was agreeable to hold his Uloric for the time being. He did express some concern about management for future gout attacks and how he would want something in the future if he does have an attack. I told him I would relay his concern. Allopurinol could be an option again, if needed. (CrCl <8mL/min- can initiate at 50mg  and titrate with additional monitoring for side effects/toxicity- ACR)  Thank you! ----- Message ----- From: Jerrol Banana., MD Sent: 11/24/2017  10:49 AM To: Caroline More, Student-PharmD, # Subject: RE: [place] Gout Initiative                Please tell Dr [name] about this issue and would recommend he stop uloric and we will see how gout does. ----- Message ----- From: Dustin Flock Sent: 11/24/2017  10:19 AM To: Jerrol Banana., MD, # Subject:  Gout Initiative                    Hi Dr. Rosanna Randy,   I am contacting you on behalf of a medication initiative approved by [place]. Your patient, [name], is on febuxostat (Uloric) and has cardiovascular disease CAD, a.fib, previous heart block. The FDA released a new black box warning for febuxostat regarding a higher rate of CV deaths in patients with CVD.   The black box warning suggests that febuxostat be reserved in patients with inadequate response to maximally titrated allopurinol (Zyloprim) or patients with a known intolerance or contraindication to allopurinol.     Please consider the following:  - Discussing risks vs. benefit with patient - Switching from febuxostat to allopurinol (CrCl <38mL/min- can initiate at 50mg   and titrate with additional monitoring for side effects/toxicity- ACR) - Potential for lifestyle management options  - Other agents that may lower uric acid levels based on your patient's coexisting conditions: losartan, fenofibrate, calcium channel blockers (already prescribed, great!)  Please contact me if assistance may be needed with facilitating potential changes or educating the patient.   Thank you for your consideration.

## 2017-11-25 NOTE — Telephone Encounter (Signed)
-----   Message from Caroline More, Student-PharmD sent at 11/25/2017  9:23 AM EDT ----- Regarding: RE: [place] Gout Initiative  Dr. Rosanna Randy,  I was able to talk with Dr. Madelin Headings today. He was agreeable to hold his Uloric for the time being. He did express some concern about management for future gout attacks and how he would want something in the future if he does have an attack. I told him I would relay his concern. Allopurinol could be an option again, if needed. (CrCl <16mL/min- can initiate at 50mg  and titrate with additional monitoring for side effects/toxicity- ACR)  Thank you! ----- Message ----- From: Jerrol Banana., MD Sent: 11/24/2017  10:49 AM To: Caroline More, Student-PharmD, # Subject: RE: [place] Gout Initiative                Please tell Dr [name] about this issue and would recommend he stop uloric and we will see how gout does. ----- Message ----- From: Dustin Flock Sent: 11/24/2017  10:19 AM To: Jerrol Banana., MD, # Subject: [place] Gout Initiative                    Hi Dr. Rosanna Randy,   I am contacting you on behalf of a medication initiative approved by [place]. Your patient, Mr. Laubacher, is on febuxostat (Uloric) and has cardiovascular disease CAD, a.fib, previous heart block. The FDA released a new black box warning for febuxostat regarding a higher rate of CV deaths in patients with CVD.   The black box warning suggests that febuxostat be reserved in patients with inadequate response to maximally titrated allopurinol (Zyloprim) or patients with a known intolerance or contraindication to allopurinol.     Please consider the following:  - Discussing risks vs. benefit with patient - Switching from febuxostat to allopurinol (CrCl <34mL/min- can initiate at 50mg  and titrate with additional monitoring for side effects/toxicity- ACR) - Potential for lifestyle management options  - Other agents that may  lower uric acid levels based on your patient's coexisting conditions: losartan, fenofibrate, calcium channel blockers (already prescribed, great!)  Please contact me if assistance may be needed with facilitating potential changes or educating the patient.   Thank you for your consideration.

## 2017-11-26 NOTE — Telephone Encounter (Signed)
[facility] ED 

## 2017-11-26 NOTE — Telephone Encounter (Signed)
Patient also has results

## 2017-11-27 ENCOUNTER — Other Ambulatory Visit: Payer: Self-pay

## 2017-11-27 ENCOUNTER — Telehealth: Payer: Self-pay

## 2017-11-27 ENCOUNTER — Ambulatory Visit (INDEPENDENT_AMBULATORY_CARE_PROVIDER_SITE_OTHER): Payer: Medicare Other

## 2017-11-27 VITALS — BP 132/54 | HR 69 | Temp 97.7°F | Ht 67.0 in | Wt 165.0 lb

## 2017-11-27 DIAGNOSIS — Z0000 Encounter for general adult medical examination without abnormal findings: Secondary | ICD-10-CM

## 2017-11-27 MED ORDER — ALLOPURINOL 100 MG PO TABS: 100.0000 mg | ORAL_TABLET | Freq: Every day | ORAL | 12 refills | Status: DC

## 2017-11-27 NOTE — Progress Notes (Signed)
Subjective:   Thomas [name] is a 84 y.o. male who presents for Medicare Annual/Subsequent preventive examination.  Review of Systems:  N/A  Cardiac Risk Factors include: advanced age (>15men, >53 women);male gender;hypertension;dyslipidemia     Objective:    Vitals: BP (!) 132/54 (BP Location: Right Arm)   Pulse 69   Temp 97.7 F (36.5 C) (Oral)   Ht 5\' 7"  (1.702 m)   Wt 165 lb (74.8 kg)   BMI 25.84 kg/m   Body mass index is 25.84 kg/m.  Advanced Directives 11/27/2017 10/23/2017 10/21/2017 09/09/2017 01/08/2017 11/26/2016 08/29/2016  Does Patient Have a Medical Advance Directive? Yes Yes Yes Yes Yes Yes Yes  Type of Paramedic of [place];Living will [place];Living will [place];Living will [place];Living will Living will;Healthcare Power of [place];Living will -  Does patient want to make changes to medical advance directive? - - - No - Patient declined Yes (Inpatient - patient requests chaplain consult to change a medical advance directive) - -  Copy of [place] in Chart? No - copy requested - - No - copy requested - No - copy requested -    Tobacco Social History   Tobacco Use  Smoking Status Never Smoker  Smokeless Tobacco Never Used     Counseling given: Not Answered   Clinical Intake:  Pre-visit preparation completed: Yes  Pain : No/denies pain Pain Score: 0-No pain     Nutritional Status: BMI 25 -29 Overweight Nutritional Risks: None Diabetes: No  How often do you need to have someone help you when you read instructions, pamphlets, or other written materials from your doctor or pharmacy?: 1 - Never  Interpreter Needed?: No  Information entered by :: [facility], LPN  Past Medical History:  Diagnosis Date  . Aortic valve disorders   . Arthritis   . Atrial flutter ([place]) 06/18/2016   "AF or AFl; not sure which" (06/23/2016)    . Basal cell carcinoma    "face, nose left shoulder, left arm" (06/19/2016)  . BBB (bundle branch block)    hx right  . Chronic back pain    "neck, thoracic, lower back" (06/19/2016)  . Complete heart block ([place]) 06/2016  . Dyspnea   . GERD (gastroesophageal reflux disease)   . Heart block    "I've had type I, II [place] before now" (06/19/2016)  . History of gout   . History of hiatal hernia    "self dx'd" (06/19/2016)  . Hyperlipidemia   . Hypertension   . Lyme disease    "dx'd by me 2003; cx's showed dx 08/2015"  . Migraine    "3-4/year" (06/19/2016)  . Presence of permanent cardiac pacemaker 06/19/2016  . PVC's (premature ventricular contractions)   . Renal cancer, left ([place]) 2006   S/P cryotherapy  . Spinal stenosis    "cervical, 1 thoracic, lumbar" (06/19/2016)  . Squamous carcinoma    "face, nose left shoulder, left arm" (06/19/2016)  . Stroke ([place])   . TIA (transient ischemic attack) 06/14/2016   "I'm not sure that's what it was" (06/25/2016)  . Visit for monitoring Tikosyn therapy 09/09/2017   Past Surgical History:  Procedure Laterality Date  . ANKLE FRACTURE SURGERY Right 1967  . BASAL CELL CARCINOMA EXCISION     "face, nose left shoulder, left arm"  . BIOPSY PROSTATE  2001 & 2003  . CARDIAC CATHETERIZATION  1990's  . CARDIOVERSION N/A 09/11/2017  Procedure: CARDIOVERSION;  Surgeon: Lelon Perla, MD;  Location: [facility] ENDOSCOPY;  Service: Cardiovascular;  Laterality: N/A;  . FRACTURE SURGERY    . INGUINAL HERNIA REPAIR Left 2012  . INSERT / REPLACE / REMOVE PACEMAKER  06/19/2016  . LAPAROSCOPIC ABLATION RENAL MASS    . LEFT HEART CATH AND CORONARY ANGIOGRAPHY Left 10/23/2017   Procedure: LEFT HEART CATH AND CORONARY ANGIOGRAPHY;  Surgeon: Wellington Hampshire, MD;  Location: [place] [facility] LAB;  Service: Cardiovascular;  Laterality: Left;  . PACEMAKER IMPLANT N/A 06/19/2016   Procedure: Pacemaker Implant;  Surgeon: Deboraha Sprang, MD;  Location: [place] [facility];   Service: Cardiovascular;  Laterality: N/A;  . pacemasker    . PROSTATE SURGERY    . SQUAMOUS CELL CARCINOMA EXCISION     "face, nose left shoulder, left arm"  . TONSILLECTOMY AND ADENOIDECTOMY     Family History  Problem Relation Age of Onset  . Heart attack Brother   . Stroke Brother   . Aortic stenosis Mother   . Arthritis Father    Social History   Socioeconomic History  . Marital status: Married    Spouse name: Not on file  . Number of children: 3  . Years of education: MD   . Highest education level: Not on file  Occupational History  . Occupation: Retired   Scientific laboratory technician  . Financial resource strain: Not hard at all  . Food insecurity:    Worry: Never true    Inability: Never true  . Transportation needs:    Medical: No    Non-medical: No  Tobacco Use  . Smoking status: Never Smoker  . Smokeless tobacco: Never Used  Substance and Sexual Activity  . Alcohol use: Yes    Alcohol/week: 0.6 oz    Types: 1 Glasses of wine per week  . Drug use: No  . Sexual activity: Yes  Lifestyle  . Physical activity:    Days per week: Not on file    Minutes per session: Not on file  . Stress: Only a little  Relationships  . Social connections:    Talks on phone: Not on file    Gets together: Not on file    Attends religious service: Not on file    Active member of club or organization: Not on file    Attends meetings of clubs or organizations: Not on file    Relationship status: Not on file  Other Topics Concern  . Not on file  Social History Narrative   Independent at baseline. Lives with family   Drinks tea in the morning     Outpatient Encounter Medications as of 11/27/2017  Medication Sig  . amLODipine (NORVASC) 5 MG tablet Take 1 tablet (5 mg total) by mouth daily.  Marland Kitchen apixaban (ELIQUIS) 2.5 MG TABS tablet Take 2.5 mg by mouth 2 (two) times daily.  Marland Kitchen atorvastatin (LIPITOR) 40 MG tablet TAKE 1 TABLET BY MOUTH EVERY DAY AT 6PM  . Cholecalciferol (VITAMIN D3) 5000  units CAPS Take 5,000 Units by mouth daily.  . Coenzyme Q10 (COQ10) 100 MG CAPS Take 100 mg by mouth daily.  . colchicine (COLCRYS) 0.6 MG tablet Take 1 tablet (0.6 mg total) by mouth daily as needed (for gout).  Marland Kitchen diclofenac sodium (VOLTAREN) 1 % GEL Apply 2 g topically daily as needed (for pain).   Marland Kitchen dofetilide (TIKOSYN) 125 MCG capsule Take 1 capsule (125 mcg total) by mouth 2 (two) times daily.  Marland Kitchen doxycycline (VIBRA-TABS) 100  MG tablet Take 1 tablet (100 mg total) by mouth 2 (two) times daily. (Patient taking differently: Take 100 mg by mouth 2 (two) times daily. )  . dutasteride (AVODART) 0.5 MG capsule Take 0.5 mg by mouth daily as needed (for urination).   Marland Kitchen esomeprazole (NEXIUM) 40 MG capsule Take 40 mg by mouth daily as needed (for heartburn).   . febuxostat (ULORIC) 40 MG tablet Take 20 mg by mouth daily.   . hydrALAZINE (APRESOLINE) 25 MG tablet Take 1 tablet (25 mg total) by mouth 3 (three) times daily as needed. (Patient taking differently: Take 25 mg by mouth 3 (three) times daily as needed (for BP > 150). )  . metoprolol succinate (TOPROL-XL) 50 MG 24 hr tablet TAKE 1 TABLET BY MOUTH DAILY. TAKE WITH OR IMMEDIATELY FOLLOWING A MEAL.  Marland Kitchen metroNIDAZOLE (FLAGYL) 500 MG tablet Take 1 tablet (500 mg total) by mouth See admin instructions. Take 500 mg by mouth twice daily on Sunday and Monday  . Multiple Vitamins-Minerals (CENTRUM SILVER 50+MEN PO) Take 1 tablet by mouth daily.  . NON FORMULARY Testosterone gel compound - apply 1 cc per day  . propranolol (INDERAL) 40 MG tablet TAKE ONE (1) TABLET THREE (3) TIMES EACHDAY AS NEEDED (Patient taking differently: TAKE ONE (1) TABLET THREE (3) TIMES daily AS NEEDED for afib)  . silodosin (RAPAFLO) 8 MG CAPS capsule Take 8 mg by mouth daily as needed (for urintation).   . terazosin (HYTRIN) 1 MG capsule Take 1 mg by mouth at bedtime.  Marland Kitchen testosterone cypionate (DEPOTESTOSTERONE CYPIONATE) 200 MG/ML injection Inject 1 mL (200 mg total) into the  muscle every 28 (twenty-eight) days.  . traMADol (ULTRAM) 50 MG tablet Take 50 mg by mouth every 6 (six) hours as needed for moderate pain.   . Turmeric 500 MG CAPS Take 500 mg by mouth daily.  . vitamin E 400 UNIT capsule Take 400 Units by mouth daily.  . fluticasone furoate-vilanterol (BREO ELLIPTA) 100-25 MCG/INH AEPB Inhale 1 puff into the lungs daily. (Patient not taking: Reported on 11/27/2017)   No facility-administered encounter medications on file as of 11/27/2017.     Activities of Daily Living In your present state of health, do you have any difficulty performing the following activities: 11/27/2017 09/09/2017  Hearing? Y -  Comment Only certain tones.  -  Vision? Y -  Comment Blurred vision due to hx of eye injury. -  Difficulty concentrating or making decisions? N -  Walking or climbing stairs? N -  Dressing or bathing? N -  Doing errands, shopping? N N  Preparing Food and eating ? N -  Using the Toilet? N -  In the past six months, have you accidently leaked urine? N -  Do you have problems with loss of bowel control? N -  Managing your Medications? N -  Managing your Finances? N -  Housekeeping or managing your Housekeeping? N -  Some recent data might be hidden    Patient Care Team: Jerrol Banana., MD as PCP - General (Family Medicine) Rockey Situ, Kathlene November, MD as Consulting Physician (Cardiology) Deboraha Sprang, MD as Consulting Physician (Cardiology) Leandrew Koyanagi, MD as Referring Physician (Ophthalmology) Serena Croissant, MD as Consulting Physician (Family Medicine) Wellington Hampshire, MD as Consulting Physician (Cardiology)   Assessment:   This is a routine wellness examination for Shriyans.  Exercise Activities and Dietary recommendations Current Exercise Habits: Home exercise routine, Type of exercise: stretching;Other - see comments(running), Time (Minutes): 30,  Frequency (Times/Week): 3, Weekly Exercise (Minutes/Week): 90, Intensity: Mild,  Exercise limited by: None identified  Goals    None      Fall Risk Fall Risk  11/27/2017 10/21/2017 05/20/2017 11/26/2016 08/29/2016  Falls in the past year? No No No No No   Is the patient's home free of loose throw rugs in walkways, pet beds, electrical cords, etc?   yes      Grab bars in the bathroom? no      Handrails on the stairs?   yes      Adequate lighting?   yes  Timed Get Up and Go Performed: N/A  Depression Screen PHQ 2/9 Scores 11/27/2017 10/21/2017 05/20/2017 11/26/2016  PHQ - 2 Score 0 0 0 0    Cognitive Function: Pt declined screening today.      6CIT Screen 11/26/2016  What Year? 0 points  What month? 0 points  What time? 0 points  Count back from 20 0 points  Months in reverse 0 points  Repeat phrase 0 points  Total Score 0    Immunization History  Administered Date(s) Administered  . Influenza, High Dose Seasonal PF 02/07/2015    Qualifies for Shingles Vaccine? Pt declines today.   Screening Tests Health Maintenance  Topic Date Due  . TETANUS/TDAP  09/30/1951  . PNA vac Low Risk Adult (1 of 2 - PCV13) 10/01/1999  . INFLUENZA VACCINE  12/04/2017   Cancer Screenings: Lung: Low Dose CT Chest recommended if Age 60-80 years, 30 pack-year currently smoking OR have quit w/in 15years. Patient does not qualify. Colorectal: N/A  Additional Screenings:  Hepatitis C Screening: N/A      Plan:  I have personally reviewed and addressed the Medicare Annual Wellness questionnaire and have noted the following in the patient's chart:  A. Medical and social history B. Use of alcohol, tobacco or illicit drugs  C. Current medications and supplements D. Functional ability and status E.  Nutritional status F.  Physical activity G. Advance directives H. List of other physicians I.  Hospitalizations, surgeries, and ER visits in previous 12 months J.  [place] such as hearing and vision if needed, cognitive and depression L. Referrals and appointments  - none  In addition, I have reviewed and discussed with patient certain preventive protocols, quality metrics, and best practice recommendations. A written personalized care plan for preventive services as well as general preventive health recommendations were provided to patient.  See attached scanned questionnaire for additional information.   Signed,  Fabio Neighbors, LPN Nurse Health Advisor   Nurse Recommendations: Pt declined the pneumonia and tetanus vaccines today due to [place] disease.

## 2017-11-27 NOTE — Telephone Encounter (Signed)
-----   Message from Jerrol Banana., MD sent at 11/25/2017 11:45 AM EDT ----- CXR stable--f/u on possible granuloma in 3-6 months

## 2017-11-27 NOTE — Telephone Encounter (Signed)
Advised  ED 

## 2017-11-27 NOTE — Progress Notes (Signed)
In

## 2017-11-27 NOTE — Telephone Encounter (Signed)
[facility]  ED   ----- Message from Jerrol Banana., MD sent at 11/25/2017 11:46 AM EDT ----- Labs stable--PSA 5.5.Testosterone 197.

## 2017-11-27 NOTE — Patient Instructions (Addendum)
Thomas [name] , Thank you for taking time to come for your Medicare Wellness Visit. I appreciate your ongoing commitment to your health goals. Please review the following plan we discussed and let me know if I can assist you in the future.   Screening recommendations/referrals: Colonoscopy: N/A Recommended yearly ophthalmology/optometry visit for glaucoma screening and checkup Recommended yearly dental visit for hygiene and checkup  Vaccinations: Influenza vaccine: N/A Pneumococcal vaccine: Pt declines today.  Tdap vaccine: Pt declines today.  Shingles vaccine: Pt declines today.     Advanced directives: Please bring a copy of your POA (Power of Attorney) and/or Living Will to your next appointment.   Conditions/risks identified: None.  Next appointment: AWV scheduled for 11/30/18 @ 10:40 AM. Pt to speak with PCP following this apt today and will set up apt with them.  Preventive Care 82 Years and Older, Male Preventive care refers to lifestyle choices and visits with your health care provider that can promote health and wellness. What does preventive care include?  A yearly physical exam. This is also called an annual well check.  Dental exams once or twice a year.  Routine eye exams. Ask your health care provider how often you should have your eyes checked.  Personal lifestyle choices, including:  Daily care of your teeth and gums.  Regular physical activity.  Eating a healthy diet.  Avoiding tobacco and drug use.  Limiting alcohol use.  Practicing safe sex.  Taking low doses of aspirin every day.  Taking vitamin and mineral supplements as recommended by your health care provider. What happens during an annual well check? The services and screenings done by your health care provider during your annual well check will depend on your age, overall health, lifestyle risk factors, and family history of disease. Counseling  Your health care provider may ask you questions  about your:  Alcohol use.  Tobacco use.  Drug use.  Emotional well-being.  Home and relationship well-being.  Sexual activity.  Eating habits.  History of falls.  Memory and ability to understand (cognition).  Work and work Statistician. Screening  You may have the following tests or measurements:  Height, weight, and BMI.  Blood pressure.  Lipid and cholesterol levels. These may be checked every 5 years, or more frequently if you are over 20 years old.  Skin check.  Lung cancer screening. You may have this screening every year starting at age 82 if you have a 30-pack-year history of smoking and currently smoke or have quit within the past 15 years.  Fecal occult blood test (FOBT) of the stool. You may have this test every year starting at age 19.  Flexible sigmoidoscopy or colonoscopy. You may have a sigmoidoscopy every 5 years or a colonoscopy every 10 years starting at age 41.  Prostate cancer screening. Recommendations will vary depending on your family history and other risks.  Hepatitis C blood test.  Hepatitis B blood test.  Sexually transmitted disease (STD) testing.  Diabetes screening. This is done by checking your blood sugar (glucose) after you have not eaten for a while (fasting). You may have this done every 1-3 years.  Abdominal aortic aneurysm (AAA) screening. You may need this if you are a current or former smoker.  Osteoporosis. You may be screened starting at age 27 if you are at high risk. Talk with your health care provider about your test results, treatment options, and if necessary, the need for more tests. Vaccines  Your health care provider may recommend  certain vaccines, such as:  Influenza vaccine. This is recommended every year.  Tetanus, diphtheria, and acellular pertussis (Tdap, Td) vaccine. You may need a Td booster every 10 years.  Zoster vaccine. You may need this after age 23.  Pneumococcal 13-valent conjugate (PCV13)  vaccine. One dose is recommended after age 70.  Pneumococcal polysaccharide (PPSV23) vaccine. One dose is recommended after age 30. Talk to your health care provider about which screenings and vaccines you need and how often you need them. This information is not intended to replace advice given to you by your health care provider. Make sure you discuss any questions you have with your health care provider. Document Released: 05/19/2015 Document Revised: 01/10/2016 Document Reviewed: 02/21/2015 Elsevier Interactive Patient Education  2017 [place] Prevention in the Home Falls can cause injuries. They can happen to people of all ages. There are many things you can do to make your home safe and to help prevent falls. What can I do on the outside of my home?  Regularly fix the edges of walkways and driveways and fix any cracks.  Remove anything that might make you trip as you walk through a door, such as a raised step or threshold.  Trim any bushes or trees on the path to your home.  Use bright outdoor lighting.  Clear any walking paths of anything that might make someone trip, such as rocks or tools.  Regularly check to see if handrails are loose or broken. Make sure that both sides of any steps have handrails.  Any raised decks and porches should have guardrails on the edges.  Have any leaves, snow, or ice cleared regularly.  Use sand or salt on walking paths during winter.  Clean up any spills in your garage right away. This includes oil or grease spills. What can I do in the bathroom?  Use night lights.  Install grab bars by the toilet and in the tub and shower. Do not use towel bars as grab bars.  Use non-skid mats or decals in the tub or shower.  If you need to sit down in the shower, use a plastic, non-slip stool.  Keep the floor dry. Clean up any water that spills on the floor as soon as it happens.  Remove soap buildup in the tub or shower  regularly.  Attach bath mats securely with double-sided non-slip rug tape.  Do not have throw rugs and other things on the floor that can make you trip. What can I do in the bedroom?  Use night lights.  Make sure that you have a light by your bed that is easy to reach.  Do not use any sheets or blankets that are too big for your bed. They should not hang down onto the floor.  Have a firm chair that has side arms. You can use this for support while you get dressed.  Do not have throw rugs and other things on the floor that can make you trip. What can I do in the kitchen?  Clean up any spills right away.  Avoid walking on wet floors.  Keep items that you use a lot in easy-to-reach places.  If you need to reach something above you, use a strong step stool that has a grab bar.  Keep electrical cords out of the way.  Do not use floor polish or wax that makes floors slippery. If you must use wax, use non-skid floor wax.  Do not have throw rugs and other  things on the floor that can make you trip. What can I do with my stairs?  Do not leave any items on the stairs.  Make sure that there are handrails on both sides of the stairs and use them. Fix handrails that are broken or loose. Make sure that handrails are as long as the stairways.  Check any carpeting to make sure that it is firmly attached to the stairs. Fix any carpet that is loose or worn.  Avoid having throw rugs at the top or bottom of the stairs. If you do have throw rugs, attach them to the floor with carpet tape.  Make sure that you have a light switch at the top of the stairs and the bottom of the stairs. If you do not have them, ask someone to add them for you. What else can I do to help prevent falls?  Wear shoes that:  Do not have high heels.  Have rubber bottoms.  Are comfortable and fit you well.  Are closed at the toe. Do not wear sandals.  If you use a stepladder:  Make sure that it is fully  opened. Do not climb a closed stepladder.  Make sure that both sides of the stepladder are locked into place.  Ask someone to hold it for you, if possible.  Clearly mark and make sure that you can see:  Any grab bars or handrails.  First and last steps.  Where the edge of each step is.  Use tools that help you move around (mobility aids) if they are needed. These include:  Canes.  Walkers.  Scooters.  Crutches.  Turn on the lights when you go into a dark area. Replace any light bulbs as soon as they burn out.  Set up your furniture so you have a clear path. Avoid moving your furniture around.  If any of your floors are uneven, fix them.  If there are any pets around you, be aware of where they are.  Review your medicines with your doctor. Some medicines can make you feel dizzy. This can increase your chance of falling. Ask your doctor what other things that you can do to help prevent falls. This information is not intended to replace advice given to you by your health care provider. Make sure you discuss any questions you have with your health care provider. Document Released: 02/16/2009 Document Revised: 09/28/2015 Document Reviewed: 05/27/2014 Elsevier Interactive Patient Education  2017 [employer].

## 2017-11-27 NOTE — Telephone Encounter (Signed)
-----   Message from Jerrol Banana., MD sent at 11/25/2017 11:46 AM EDT ----- Labs stable--PSA 5.5.Testosterone 197.

## 2017-11-27 NOTE — Telephone Encounter (Signed)
[facility]  ED   ----- Message from Jerrol Banana., MD sent at 11/25/2017 11:45 AM EDT ----- CXR stable--f/u on possible granuloma in 3-6 months

## 2017-12-25 ENCOUNTER — Encounter: Payer: Self-pay | Admitting: Urology

## 2017-12-25 ENCOUNTER — Ambulatory Visit (INDEPENDENT_AMBULATORY_CARE_PROVIDER_SITE_OTHER): Payer: Medicare Other | Admitting: Urology

## 2017-12-25 VITALS — BP 155/77 | HR 67 | Ht 67.0 in | Wt 169.8 lb

## 2017-12-25 DIAGNOSIS — N138 Other obstructive and reflux uropathy: Secondary | ICD-10-CM | POA: Diagnosis not present

## 2017-12-25 DIAGNOSIS — N401 Enlarged prostate with lower urinary tract symptoms: Secondary | ICD-10-CM

## 2017-12-25 DIAGNOSIS — E291 Testicular hypofunction: Secondary | ICD-10-CM

## 2017-12-25 DIAGNOSIS — N181 Chronic kidney disease, stage 1: Secondary | ICD-10-CM | POA: Diagnosis not present

## 2017-12-25 NOTE — Progress Notes (Signed)
12/25/2017 1:44 PM   Thomas [name] 16-Dec-1932 [id number]  Referring provider: Jerrol Banana., MD [address] [place], [place] [address]  Chief Complaint  Patient presents with  . 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  He has a long history of fatigue however states he was diagnosed with Lyme disease approximately 20 years ago.  He also has had complete heart block status post pacemaker placement.  He had a testosterone level drawn at Dr. Alben Spittle last month which was 197.  He has had 3 previous prostate biopsies for PSA in the 6 range which were benign.  He had significant prostate enlargement at 150 g.  He is on silodosin and takes Avodart may be twice a month.  His PSA in July was stable at 5.5.  He has had prior TUMT.  He has stable lower urinary tract symptoms.  Denies dysuria or gross hematuria.  He has a history of renal cell carcinoma in 2006 treated with percutaneous cryoablation.   PMH: Past Medical History:  Diagnosis Date  . Aortic valve disorders   . Arthritis   . Atrial flutter ([place]) 06/18/2016   "AF or AFl; not sure which" (06/23/2016)  . Basal cell carcinoma    "face, nose left shoulder, left arm" (06/19/2016)  . BBB (bundle branch block)    hx right  . Chronic back pain    "neck, thoracic, lower back" (06/19/2016)  . Complete heart block ([place]) 06/2016  . Dyspnea   . GERD (gastroesophageal reflux disease)   . Heart block    "I've had type I, II Wen[place] before now" (06/19/2016)  . History of gout   . History of hiatal hernia    "self dx'd" (06/19/2016)  . Hyperlipidemia   . Hypertension   . Lyme disease    "dx'd by me 2003; cx's showed dx 08/2015"  . Migraine    "3-4/year" (06/19/2016)  . Presence of permanent cardiac pacemaker 06/19/2016  . PVC's (premature ventricular contractions)   . Renal cancer, left ([place]) 2006   S/P cryotherapy  . Spinal stenosis    "cervical, 1 thoracic,  lumbar" (06/19/2016)  . Squamous carcinoma    "face, nose left shoulder, left arm" (06/19/2016)  . Stroke ([place])   . TIA (transient ischemic attack) 06/14/2016   "I'm not sure that's what it was" (06/25/2016)  . Visit for monitoring Tikosyn therapy 09/09/2017    Surgical History: Past Surgical History:  Procedure Laterality Date  . ANKLE FRACTURE SURGERY Right 1967  . BASAL CELL CARCINOMA EXCISION     "face, nose left shoulder, left arm"  . BIOPSY PROSTATE  2001 & 2003  . CARDIAC CATHETERIZATION  1990's  . CARDIOVERSION N/A 09/11/2017   Procedure: CARDIOVERSION;  Surgeon: Lelon Perla, MD;  Location: [facility] ENDOSCOPY;  Service: Cardiovascular;  Laterality: N/A;  . FRACTURE SURGERY    . INGUINAL HERNIA REPAIR Left 2012  . INSERT / REPLACE / REMOVE PACEMAKER  06/19/2016  . LAPAROSCOPIC ABLATION RENAL MASS    . LEFT HEART CATH AND CORONARY ANGIOGRAPHY Left 10/23/2017   Procedure: LEFT HEART CATH AND CORONARY ANGIOGRAPHY;  Surgeon: Wellington Hampshire, MD;  Location: [facility] CV LAB;  Service: Cardiovascular;  Laterality: Left;  . PACEMAKER IMPLANT N/A 06/19/2016   Procedure: Pacemaker Implant;  Surgeon: Deboraha Sprang, MD;  Location: [facility] LAB;  Service: Cardiovascular;  Laterality: N/A;  . pacemasker    . PROSTATE SURGERY    .  SQUAMOUS CELL CARCINOMA EXCISION     "face, nose left shoulder, left arm"  . TONSILLECTOMY AND ADENOIDECTOMY      Home Medications:  Allergies as of 12/25/2017      Reactions   Iodine Hives, Other (See Comments)   IVP contrast   Penicillins Itching, Rash, Other (See Comments)   ITCHY FEELING IN FINGERS Has patient had a PCN reaction causing immediate rash, facial/tongue/throat swelling, SOB or lightheadedness with hypotension: No Has patient had a PCN reaction causing severe rash involving mucus membranes or skin necrosis: No Has patient had a PCN reaction that required hospitalization No Has patient had a PCN reaction occurring within the last 10  years: No If all of the above answers are "NO", then may proceed with Cephalosporin use.      Medication List        Accurate as of 12/25/17  1:44 PM. Always use your most recent med list.          allopurinol 100 MG tablet Commonly known as:  ZYLOPRIM Take 1 tablet (100 mg total) by mouth daily.   amLODipine 5 MG tablet Commonly known as:  NORVASC Take 1 tablet (5 mg total) by mouth daily.   atorvastatin 40 MG tablet Commonly known as:  LIPITOR TAKE 1 TABLET BY MOUTH EVERY DAY AT 6PM   CENTRUM SILVER 50+MEN PO Take 1 tablet by mouth daily.   colchicine 0.6 MG tablet Take 1 tablet (0.6 mg total) by mouth daily as needed (for gout).   CoQ10 100 MG Caps Take 100 mg by mouth daily.   dofetilide 125 MCG capsule Commonly known as:  TIKOSYN Take 1 capsule (125 mcg total) by mouth 2 (two) times daily.   doxycycline 100 MG tablet Commonly known as:  VIBRA-TABS Take 1 tablet (100 mg total) by mouth 2 (two) times daily.   dutasteride 0.5 MG capsule Commonly known as:  AVODART Take 0.5 mg by mouth daily as needed (for urination).   ELIQUIS 2.5 MG Tabs tablet Generic drug:  apixaban Take 2.5 mg by mouth 2 (two) times daily.   esomeprazole 40 MG capsule Commonly known as:  NEXIUM Take 40 mg by mouth daily as needed (for heartburn).   febuxostat 40 MG tablet Commonly known as:  ULORIC Take 20 mg by mouth daily.   fluticasone furoate-vilanterol 100-25 MCG/INH Aepb Commonly known as:  BREO ELLIPTA Inhale 1 puff into the lungs daily.   hydrALAZINE 25 MG tablet Commonly known as:  APRESOLINE Take 1 tablet (25 mg total) by mouth 3 (three) times daily as needed.   metoprolol succinate 50 MG 24 hr tablet Commonly known as:  TOPROL-XL TAKE 1 TABLET BY MOUTH DAILY. TAKE WITH OR IMMEDIATELY FOLLOWING A MEAL.   metroNIDAZOLE 500 MG tablet Commonly known as:  FLAGYL Take 1 tablet (500 mg total) by mouth See admin instructions. Take 500 mg by mouth twice daily on Sunday and  Monday   NON FORMULARY Testosterone gel compound - apply 1 cc per day   propranolol 40 MG tablet Commonly known as:  INDERAL TAKE ONE (1) TABLET THREE (3) TIMES EACHDAY AS NEEDED   RAPAFLO 8 MG Caps capsule Generic drug:  silodosin Take 8 mg by mouth daily as needed (for urintation).   temazepam 30 MG capsule Commonly known as:  RESTORIL temazepam 30 mg capsule   terazosin 1 MG capsule Commonly known as:  HYTRIN Take 1 mg by mouth at bedtime.   testosterone cypionate 200 MG/ML injection Commonly known as:  DEPOTESTOSTERONE CYPIONATE Inject 1 mL (200 mg total) into the muscle every 28 (twenty-eight) days.   traMADol 50 MG tablet Commonly known as:  ULTRAM Take 50 mg by mouth every 6 (six) hours as needed for moderate pain.   Turmeric 500 MG Caps Take 500 mg by mouth daily.   Vitamin D3 5000 units Caps Take 5,000 Units by mouth daily.   vitamin E 400 UNIT capsule Take 400 Units by mouth daily.   VOLTAREN 1 % Gel Generic drug:  diclofenac sodium Apply 2 g topically daily as needed (for pain).       Allergies:  Allergies  Allergen Reactions  . Iodine Hives and Other (See Comments)    IVP contrast  . Penicillins Itching, Rash and Other (See Comments)    ITCHY FEELING IN FINGERS Has patient had a PCN reaction causing immediate rash, facial/tongue/throat swelling, SOB or lightheadedness with hypotension: No Has patient had a PCN reaction causing severe rash involving mucus membranes or skin necrosis: No Has patient had a PCN reaction that required hospitalization No Has patient had a PCN reaction occurring within the last 10 years: No If all of the above answers are "NO", then may proceed with Cephalosporin use.     Family History: Family History  Problem Relation Age of Onset  . Heart attack Brother   . Stroke Brother   . Aortic stenosis Mother   . Arthritis Father     Social History:  reports that he has never smoked. He has never used smokeless  tobacco. He reports that he drinks about 1.0 standard drinks of alcohol per week. He reports that he does not use drugs.  ROS: UROLOGY Frequent Urination?: Yes Hard to postpone urination?: Yes Burning/pain with urination?: No Get up at night to urinate?: Yes Leakage of urine?: Yes Urine stream starts and stops?: No Trouble starting stream?: Yes Do you have to strain to urinate?: No Blood in urine?: No Urinary tract infection?: No Sexually transmitted disease?: No Injury to kidneys or bladder?: Yes Painful intercourse?: No Weak stream?: No Erection problems?: No Penile pain?: No  Gastrointestinal Nausea?: No Vomiting?: No Indigestion/heartburn?: No Diarrhea?: No Constipation?: No  Constitutional Fever: No Night sweats?: No Weight loss?: No Fatigue?: No  Skin Skin rash/lesions?: No Itching?: No  Eyes Blurred vision?: No Double vision?: No  Ears/Nose/Throat Sore throat?: No Sinus problems?: No  Hematologic/Lymphatic Swollen glands?: No Easy bruising?: No  Cardiovascular Leg swelling?: No Chest pain?: No  Respiratory Cough?: No Shortness of breath?: No  Endocrine Excessive thirst?: No  Musculoskeletal Back pain?: No Joint pain?: No  Neurological Headaches?: No Dizziness?: No  Psychologic Depression?: No Anxiety?: No  Physical Exam: BP (!) 155/77 (BP Location: Left Arm, Patient Position: Sitting, Cuff Size: Large)   Pulse 67   Ht 5\' 7"  (1.702 m)   Wt 169 lb 12.8 oz (77 kg)   BMI 26.59 kg/m   Constitutional:  Alert and oriented, No acute distress. HEENT: [place] AT, moist mucus membranes.  Trachea midline, no masses. Cardiovascular: No clubbing, cyanosis, or edema. Respiratory: Normal respiratory effort, no increased work of breathing. GI: Abdomen is soft, nontender, nondistended, no abdominal masses GU: No CVA tenderness Lymph: No cervical or inguinal lymphadenopathy. Skin: No rashes, bruises or suspicious lesions. Neurologic: Grossly  intact, no focal deficits, moving all 4 extremities. Psychiatric: Normal mood and affect.  Laboratory Data: Lab Results  Component Value Date   WBC 4.8 11/21/2017   HGB 16.2 11/21/2017   HCT 50.5 11/21/2017  MCV 91 11/21/2017   PLT 158 11/21/2017    Lab Results  Component Value Date   CREATININE 1.55 (H) 11/21/2017    Lab Results  Component Value Date   PSA 5.8 08/22/2014    Lab Results  Component Value Date   TESTOSTERONE 197 (L) 11/21/2017    Assessment & Plan:   84 year old male with fatigue and hypogonadism.  He feels he has several reasons for fatigue and is interested in getting his testosterone level in the 500 range to see if he feels any better.  He has been on injections in the past but not consistently.  He will restart at 200 mg every 2 weeks and will check a testosterone level in 4 to 6 weeks.  Will also schedule a renal ultrasound as he has a history of chronic kidney disease and is concerned he may have obstruction.   Abbie Sons, [place] [address], [place], [place] [address] [phone]

## 2017-12-28 ENCOUNTER — Encounter: Payer: Self-pay | Admitting: Urology

## 2018-01-01 ENCOUNTER — Other Ambulatory Visit: Payer: Self-pay | Admitting: Anesthesiology

## 2018-01-01 ENCOUNTER — Encounter: Payer: Self-pay | Admitting: Anesthesiology

## 2018-01-01 ENCOUNTER — Ambulatory Visit (HOSPITAL_BASED_OUTPATIENT_CLINIC_OR_DEPARTMENT_OTHER): Payer: Medicare Other | Admitting: Anesthesiology

## 2018-01-01 ENCOUNTER — Ambulatory Visit
Admission: RE | Admit: 2018-01-01 | Discharge: 2018-01-01 | Disposition: A | Discharge status: Home / Self Care | Payer: Medicare Other | Source: Ambulatory Visit | Attending: Anesthesiology | Admitting: Anesthesiology

## 2018-01-01 ENCOUNTER — Other Ambulatory Visit: Payer: Self-pay

## 2018-01-01 VITALS — BP 153/106 | HR 69 | Temp 97.7°F | Resp 18 | Ht 67.0 in | Wt 160.0 lb

## 2018-01-01 DIAGNOSIS — M25551 Pain in right hip: Secondary | ICD-10-CM | POA: Insufficient documentation

## 2018-01-01 DIAGNOSIS — G8929 Other chronic pain: Secondary | ICD-10-CM | POA: Diagnosis not present

## 2018-01-01 DIAGNOSIS — M5136 Other intervertebral disc degeneration, lumbar region: Secondary | ICD-10-CM | POA: Diagnosis not present

## 2018-01-01 DIAGNOSIS — M48061 Spinal stenosis, lumbar region without neurogenic claudication: Secondary | ICD-10-CM | POA: Insufficient documentation

## 2018-01-01 DIAGNOSIS — M5431 Sciatica, right side: Secondary | ICD-10-CM | POA: Insufficient documentation

## 2018-01-01 DIAGNOSIS — Z79899 Other long term (current) drug therapy: Secondary | ICD-10-CM | POA: Insufficient documentation

## 2018-01-01 DIAGNOSIS — Z7951 Long term (current) use of inhaled steroids: Secondary | ICD-10-CM | POA: Diagnosis not present

## 2018-01-01 DIAGNOSIS — R52 Pain, unspecified: Secondary | ICD-10-CM

## 2018-01-01 DIAGNOSIS — M5432 Sciatica, left side: Secondary | ICD-10-CM | POA: Insufficient documentation

## 2018-01-01 DIAGNOSIS — Z7901 Long term (current) use of anticoagulants: Secondary | ICD-10-CM | POA: Diagnosis not present

## 2018-01-01 MED ORDER — LIDOCAINE HCL (PF) 1 % IJ SOLN
5.0000 mL | Freq: Once | INTRAMUSCULAR | Status: AC
  Administered 2018-01-01: 5 mL via SUBCUTANEOUS

## 2018-01-01 MED ORDER — TRIAMCINOLONE ACETONIDE 40 MG/ML IJ SUSP
40.0000 mg | Freq: Once | INTRAMUSCULAR | Status: AC
  Administered 2018-01-01: 40 mg

## 2018-01-01 MED ORDER — SODIUM CHLORIDE 0.9 % IJ SOLN
INTRAMUSCULAR | Status: AC
  Filled 2018-01-01: qty 10

## 2018-01-01 MED ORDER — LIDOCAINE HCL 2 % IJ SOLN
INTRAMUSCULAR | Status: AC
  Filled 2018-01-01: qty 20

## 2018-01-01 MED ORDER — IOPAMIDOL (ISOVUE-M 200) INJECTION 41%: 20.0000 mL | Freq: Once | INTRAMUSCULAR | Status: DC | PRN

## 2018-01-01 MED ORDER — ROPIVACAINE HCL 2 MG/ML IJ SOLN
10.0000 mL | Freq: Once | INTRAMUSCULAR | Status: AC
  Administered 2018-01-01: 1 mL via EPIDURAL

## 2018-01-01 MED ORDER — SODIUM CHLORIDE 0.9% FLUSH
10.0000 mL | Freq: Once | INTRAVENOUS | Status: AC
  Administered 2018-01-01: 4 mL

## 2018-01-01 MED ORDER — ROPIVACAINE HCL 2 MG/ML IJ SOLN
INTRAMUSCULAR | Status: AC
  Filled 2018-01-01: qty 10

## 2018-01-01 NOTE — Progress Notes (Signed)
Subjective:  Patient ID: Thomas [name], male    DOB: 10/11/1932  Age: 84 y.o. MRN: [id number]  CC: Back Pain and Hip Pain (right)   Procedure: L5-S1 epidural steroid under fluoroscopic guidance with no sedation  HPI Thomas [name] presents for reevaluation.  He was last seen several months ago and had an epidural at that time.  Periodically he has exacerbation of a chronic low back pain with radiation into the bilateral buttocks hips and occasionally down the posterior leg.  At this point he is having some right greater than left posterior calf pain and desires to proceed with a repeat epidural.  Unfortunately conservative therapy with physical therapy and medication management has been unsuccessful and the most effective modality for him has been epidural administration.  Otherwise he is in his usual state of health no new change in lower extremity strength or function or changes in bowel or bladder function.  Outpatient Medications Prior to Visit  Medication Sig Dispense Refill  . allopurinol (ZYLOPRIM) 100 MG tablet Take 1 tablet (100 mg total) by mouth daily. 30 tablet 12  . amLODipine (NORVASC) 5 MG tablet Take 1 tablet (5 mg total) by mouth daily. 180 tablet 4  . apixaban (ELIQUIS) 2.5 MG TABS tablet Take 2.5 mg by mouth 2 (two) times daily.    Marland Kitchen atorvastatin (LIPITOR) 40 MG tablet TAKE 1 TABLET BY MOUTH EVERY DAY AT 6PM 90 tablet 2  . Cholecalciferol (VITAMIN D3) 5000 units CAPS Take 5,000 Units by mouth daily.    . Coenzyme Q10 (COQ10) 100 MG CAPS Take 100 mg by mouth daily.    . colchicine (COLCRYS) 0.6 MG tablet Take 1 tablet (0.6 mg total) by mouth daily as needed (for gout). 30 tablet 5  . diclofenac sodium (VOLTAREN) 1 % GEL Apply 2 g topically daily as needed (for pain).     Marland Kitchen dofetilide (TIKOSYN) 125 MCG capsule Take 1 capsule (125 mcg total) by mouth 2 (two) times daily. 180 capsule 1  . dutasteride (AVODART) 0.5 MG capsule Take 0.5 mg by mouth daily as needed (for urination).      Marland Kitchen esomeprazole (NEXIUM) 40 MG capsule Take 40 mg by mouth daily as needed (for heartburn).     . febuxostat (ULORIC) 40 MG tablet Take 20 mg by mouth daily.     . fluticasone furoate-vilanterol (BREO ELLIPTA) 100-25 MCG/INH AEPB Inhale 1 puff into the lungs daily. 1 each 5  . hydrALAZINE (APRESOLINE) 25 MG tablet Take 1 tablet (25 mg total) by mouth 3 (three) times daily as needed. (Patient taking differently: Take 25 mg by mouth 3 (three) times daily as needed (for BP > 150). ) 90 tablet 6  . metoprolol succinate (TOPROL-XL) 50 MG 24 hr tablet TAKE 1 TABLET BY MOUTH DAILY. TAKE WITH OR IMMEDIATELY FOLLOWING A MEAL. 90 tablet 3  . metroNIDAZOLE (FLAGYL) 500 MG tablet Take 1 tablet (500 mg total) by mouth See admin instructions. Take 500 mg by mouth twice daily on Sunday and Monday 30 tablet 11  . Multiple Vitamins-Minerals (CENTRUM SILVER 50+MEN PO) Take 1 tablet by mouth daily.    . NON FORMULARY Testosterone gel compound - apply 1 cc per day    . propranolol (INDERAL) 40 MG tablet TAKE ONE (1) TABLET THREE (3) TIMES EACHDAY AS NEEDED (Patient taking differently: TAKE ONE (1) TABLET THREE (3) TIMES daily AS NEEDED for afib) 270 tablet 1  . silodosin (RAPAFLO) 8 MG CAPS capsule Take 8 mg by mouth  daily as needed (for urintation).     . temazepam (RESTORIL) 30 MG capsule temazepam 30 mg capsule    . terazosin (HYTRIN) 1 MG capsule Take 1 mg by mouth at bedtime.    Marland Kitchen testosterone cypionate (DEPOTESTOSTERONE CYPIONATE) 200 MG/ML injection Inject 1 mL (200 mg total) into the muscle every 28 (twenty-eight) days. 10 mL 5  . traMADol (ULTRAM) 50 MG tablet Take 50 mg by mouth every 6 (six) hours as needed for moderate pain.     . Turmeric 500 MG CAPS Take 500 mg by mouth daily.    . vitamin E 400 UNIT capsule Take 400 Units by mouth daily.    Marland Kitchen doxycycline (VIBRA-TABS) 100 MG tablet Take 1 tablet (100 mg total) by mouth 2 (two) times daily. (Patient not taking: Reported on 01/01/2018) 20 tablet 4   No  facility-administered medications prior to visit.     Review of Systems CNS: No confusion or sedation Cardiac: No angina or palpitations GI: No abdominal pain or constipation constitutional: No nausea vomiting fevers or chills  Objective:  BP (!) 153/106   Pulse 69   Temp 97.7 F (36.5 C)   Resp 18   Ht 5\' 7"  (1.702 m)   Wt 160 lb (72.6 kg)   SpO2 98%   BMI 25.06 kg/m    BP Readings from Last 3 Encounters:  01/01/18 (!) 153/106  12/25/17 (!) 155/77  11/27/17 (!) 132/54     Wt Readings from Last 3 Encounters:  01/01/18 160 lb (72.6 kg)  12/25/17 169 lb 12.8 oz (77 kg)  11/27/17 165 lb (74.8 kg)     Physical Exam Pt is alert and oriented PERRL EOMI HEART IS RRR no murmur or rub LCTA no wheezing or rales MUSCULOSKELETAL reveals some paraspinous muscle tenderness in the lumbar region but no overt trigger points.  His muscle tone and bulk to the lower extremities is at baseline.  Labs  Lab Results  Component Value Date   HGBA1C 5.6 07/11/2016   HGBA1C 5.5 06/14/2016   Lab Results  Component Value Date   LDLCALC 62 11/21/2017   CREATININE 1.55 (H) 11/21/2017    -------------------------------------------------------------------------------------------------------------------- Lab Results  Component Value Date   WBC 4.8 11/21/2017   HGB 16.2 11/21/2017   HCT 50.5 11/21/2017   PLT 158 11/21/2017   GLUCOSE 93 11/21/2017   CHOL 132 11/21/2017   TRIG 43 11/21/2017   HDL 61 11/21/2017   LDLCALC 62 11/21/2017   ALT 23 11/21/2017   AST 25 11/21/2017   NA 138 11/21/2017   K 4.3 11/21/2017   CL 100 11/21/2017   CREATININE 1.55 (H) 11/21/2017   BUN 20 11/21/2017   CO2 22 11/21/2017   TSH 2.260 11/21/2017   PSA 5.8 08/22/2014   INR 1.06 06/25/2016   HGBA1C 5.6 07/11/2016    --------------------------------------------------------------------------------------------------------------------- Dg C-arm 1-60 Min-no Report  Result Date:  01/01/2018 Fluoroscopy was utilized by the requesting physician.  No radiographic interpretation.     Assessment & Plan:   Thomas [name] was seen today for back pain and hip pain.  Diagnoses and all orders for this visit:  DDD (degenerative disc disease), lumbar  Spinal stenosis of lumbar region without neurogenic claudication  Bilateral sciatica  Sciatica, right side  Other orders -     triamcinolone acetonide (KENALOG-40) injection 40 mg -     sodium chloride flush (NS) 0.9 % injection 10 mL -     ropivacaine (PF) 2 mg/mL (0.2%) (NAROPIN) injection 10 mL -  lidocaine (PF) (XYLOCAINE) 1 % injection 5 mL -     iopamidol (ISOVUE-M) 41 % intrathecal injection 20 mL        ----------------------------------------------------------------------------------------------------------------------  Problem List Items Addressed This Visit    None    Visit Diagnoses    DDD (degenerative disc disease), lumbar    -  Primary   Relevant Medications   triamcinolone acetonide (KENALOG-40) injection 40 mg (Completed)   Spinal stenosis of lumbar region without neurogenic claudication       Bilateral sciatica       Sciatica, right side            ----------------------------------------------------------------------------------------------------------------------  1. DDD (degenerative disc disease), lumbar We will proceed with an epidural steroid today at the L5-S1 level for therapeutic purpose.  I have gone over the risks and benefits of the procedure with him in full detail and all questions have been answered.  We will schedule him for a return to clinic in approximately 2 to 3 months for possible repeat epidural at that time depending on his response to therapy today.  2. Spinal stenosis of lumbar region without neurogenic claudication As above  3. Bilateral sciatica As above  4. Sciatica, right  side Above    ----------------------------------------------------------------------------------------------------------------------  I am having Thomas [name] maintain his esomeprazole, febuxostat, VOLTAREN, hydrALAZINE, terazosin, traMADol, propranolol, dutasteride, silodosin, fluticasone furoate-vilanterol, apixaban, dofetilide, amLODipine, colchicine, metoprolol succinate, atorvastatin, testosterone cypionate, Multiple Vitamins-Minerals (CENTRUM SILVER 50+MEN PO), CoQ10, vitamin E, Vitamin D3, Turmeric, doxycycline, metroNIDAZOLE, NON FORMULARY, allopurinol, and temazepam. We administered triamcinolone acetonide, sodium chloride flush, ropivacaine (PF) 2 mg/mL (0.2%), and lidocaine (PF).   Meds ordered this encounter  Medications  . triamcinolone acetonide (KENALOG-40) injection 40 mg  . sodium chloride flush (NS) 0.9 % injection 10 mL  . ropivacaine (PF) 2 mg/mL (0.2%) (NAROPIN) injection 10 mL  . lidocaine (PF) (XYLOCAINE) 1 % injection 5 mL  . iopamidol (ISOVUE-M) 41 % intrathecal injection 20 mL   Patient's Medications  New Prescriptions   No medications on file  Previous Medications   ALLOPURINOL (ZYLOPRIM) 100 MG TABLET    Take 1 tablet (100 mg total) by mouth daily.   AMLODIPINE (NORVASC) 5 MG TABLET    Take 1 tablet (5 mg total) by mouth daily.   APIXABAN (ELIQUIS) 2.5 MG TABS TABLET    Take 2.5 mg by mouth 2 (two) times daily.   ATORVASTATIN (LIPITOR) 40 MG TABLET    TAKE 1 TABLET BY MOUTH EVERY DAY AT 6PM   CHOLECALCIFEROL (VITAMIN D3) 5000 UNITS CAPS    Take 5,000 Units by mouth daily.   COENZYME Q10 (COQ10) 100 MG CAPS    Take 100 mg by mouth daily.   COLCHICINE (COLCRYS) 0.6 MG TABLET    Take 1 tablet (0.6 mg total) by mouth daily as needed (for gout).   DICLOFENAC SODIUM (VOLTAREN) 1 % GEL    Apply 2 g topically daily as needed (for pain).    DOFETILIDE (TIKOSYN) 125 MCG CAPSULE    Take 1 capsule (125 mcg total) by mouth 2 (two) times daily.   DOXYCYCLINE  (VIBRA-TABS) 100 MG TABLET    Take 1 tablet (100 mg total) by mouth 2 (two) times daily.   DUTASTERIDE (AVODART) 0.5 MG CAPSULE    Take 0.5 mg by mouth daily as needed (for urination).    ESOMEPRAZOLE (NEXIUM) 40 MG CAPSULE    Take 40 mg by mouth daily as needed (for heartburn).    FEBUXOSTAT (ULORIC) 40 MG  TABLET    Take 20 mg by mouth daily.    FLUTICASONE FUROATE-VILANTEROL (BREO ELLIPTA) 100-25 MCG/INH AEPB    Inhale 1 puff into the lungs daily.   HYDRALAZINE (APRESOLINE) 25 MG TABLET    Take 1 tablet (25 mg total) by mouth 3 (three) times daily as needed.   METOPROLOL SUCCINATE (TOPROL-XL) 50 MG 24 HR TABLET    TAKE 1 TABLET BY MOUTH DAILY. TAKE WITH OR IMMEDIATELY FOLLOWING A MEAL.   METRONIDAZOLE (FLAGYL) 500 MG TABLET    Take 1 tablet (500 mg total) by mouth See admin instructions. Take 500 mg by mouth twice daily on Sunday and Monday   MULTIPLE VITAMINS-MINERALS (CENTRUM SILVER 50+MEN PO)    Take 1 tablet by mouth daily.   NON FORMULARY    Testosterone gel compound - apply 1 cc per day   PROPRANOLOL (INDERAL) 40 MG TABLET    TAKE ONE (1) TABLET THREE (3) TIMES EACHDAY AS NEEDED   SILODOSIN (RAPAFLO) 8 MG CAPS CAPSULE    Take 8 mg by mouth daily as needed (for urintation).    TEMAZEPAM (RESTORIL) 30 MG CAPSULE    temazepam 30 mg capsule   TERAZOSIN (HYTRIN) 1 MG CAPSULE    Take 1 mg by mouth at bedtime.   TESTOSTERONE CYPIONATE (DEPOTESTOSTERONE CYPIONATE) 200 MG/ML INJECTION    Inject 1 mL (200 mg total) into the muscle every 28 (twenty-eight) days.   TRAMADOL (ULTRAM) 50 MG TABLET    Take 50 mg by mouth every 6 (six) hours as needed for moderate pain.    TURMERIC 500 MG CAPS    Take 500 mg by mouth daily.   VITAMIN E 400 UNIT CAPSULE    Take 400 Units by mouth daily.  Modified Medications   No medications on file  Discontinued Medications   No medications on file    ----------------------------------------------------------------------------------------------------------------------  Follow-up: Return in about 3 months (around 04/03/2018) for procedure.   Procedure:L 5 S1 LESI with fluoroscopic guidance and no moderate sedation  NOTE: The risks, benefits, and expectations of the procedure have been discussed and explained to the patient who was understanding and in agreement with suggested treatment plan. No guarantees were made.  DESCRIPTION OF PROCEDURE: Lumbar epidural steroid injection with no IV Versed, EKG, blood pressure, pulse, and pulse oximetry monitoring. The procedure was performed with the patient in the prone position under fluoroscopic guidance.  Sterile prep x3 was initiated and I then injected subcutaneous lidocaine to the overlying L5-S1 site after its fluoroscopic identifictation.  Using strict aseptic technique, I then advanced an 18-gauge Tuohy epidural needle in the midline using interlaminar approach via loss-of-resistance to saline technique. There was negative aspiration for heme or  CSF.  I then confirmed position with both AP and Lateral fluoroscan.  0 cc of Isovue were injected and a  total of 5 mL of Preservative-Free normal saline mixed with 40 mg of Kenalog and 1cc Ropicaine 0.2 percent were injected incrementally via the  epidurally placed needle. The needle was removed. The patient tolerated the injection well and was convalesced and discharged to home in stable condition. Should the patient have any post procedure difficulty they have been instructed on how to contact us for assistance.    Molli Barrows, MD

## 2018-01-01 NOTE — Progress Notes (Signed)
Safety precautions to be maintained throughout the outpatient stay will include: orient to surroundings, keep bed in low position, maintain call bell within reach at all times, provide assistance with transfer out of bed and ambulation.  

## 2018-01-02 ENCOUNTER — Telehealth: Payer: Self-pay

## 2018-01-02 NOTE — Telephone Encounter (Signed)
Post procedure phone call.  LM 

## 2018-01-06 ENCOUNTER — Ambulatory Visit
Admission: RE | Admit: 2018-01-06 | Discharge: 2018-01-06 | Disposition: A | Discharge status: Home / Self Care | Payer: Medicare Other | Source: Ambulatory Visit | Attending: Urology | Admitting: Urology

## 2018-01-06 DIAGNOSIS — N181 Chronic kidney disease, stage 1: Secondary | ICD-10-CM | POA: Diagnosis not present

## 2018-01-06 DIAGNOSIS — Z85528 Personal history of other malignant neoplasm of kidney: Secondary | ICD-10-CM | POA: Diagnosis not present

## 2018-01-06 DIAGNOSIS — N400 Enlarged prostate without lower urinary tract symptoms: Secondary | ICD-10-CM | POA: Insufficient documentation

## 2018-01-11 DIAGNOSIS — H109 Unspecified conjunctivitis: Secondary | ICD-10-CM | POA: Diagnosis not present

## 2018-01-27 ENCOUNTER — Other Ambulatory Visit: Payer: Self-pay

## 2018-01-27 ENCOUNTER — Other Ambulatory Visit: Payer: Self-pay | Admitting: Cardiovascular Disease

## 2018-01-27 NOTE — Telephone Encounter (Signed)
Lmovm/no answer to contact office to verify Propranolol dosage/instructions.

## 2018-01-27 NOTE — Telephone Encounter (Signed)
The last dose on his medication list was for propranolol 40 mg TID as needed.   Do you mind calling the patient/ his wife to verify what dose he is taking as needed.  He was due to follow up with Dr. Fletcher Anon this month and will need to schedule this.  Just let me know what dose of propranolol that he has at home.   Thanks!

## 2018-01-27 NOTE — Patient Outreach (Signed)
[facility]) Care Management  01/27/2018  Thomas [name] 1933/03/14 [id number]   Medication Adherence call to Thomas [name] left a message for patient to call back patient is due on Atorvastatin 40 mg. Thomas [name] is showing past due under [place].  [facility] Management Direct Dial [phone]  Fax [phone] [name]

## 2018-01-27 NOTE — Telephone Encounter (Signed)
Please advise if ok to go forward with propranolol refill request.  Pt was told to f/u in 2 months last office visit 7/19. Pt called and cancelled appointment for f/u and didn't feel need to f/u in 2 months.

## 2018-01-29 ENCOUNTER — Ambulatory Visit (INDEPENDENT_AMBULATORY_CARE_PROVIDER_SITE_OTHER): Payer: Medicare Other | Admitting: *Deleted

## 2018-01-29 DIAGNOSIS — I442 Atrioventricular block, complete: Secondary | ICD-10-CM

## 2018-01-29 NOTE — Progress Notes (Signed)
Remote pacemaker transmission.   

## 2018-01-30 ENCOUNTER — Encounter: Payer: Self-pay | Admitting: Cardiology

## 2018-02-02 NOTE — Telephone Encounter (Signed)
Pt is taking propranolol 20 mg tablet tid prn.  Please advise if ok to refill.

## 2018-02-02 NOTE — Telephone Encounter (Signed)
Ok to refill PRN propranolol 40 mg tablets # 30 w/ 2 refills.   Thanks!

## 2018-02-02 NOTE — Telephone Encounter (Signed)
Pt due for appointment please contact pt for f/u.

## 2018-02-02 NOTE — Telephone Encounter (Signed)
-  Type error correction  Pt is taking 40 mg tablet tid prn.

## 2018-02-05 ENCOUNTER — Other Ambulatory Visit: Payer: Self-pay

## 2018-02-17 ENCOUNTER — Encounter: Payer: Self-pay | Admitting: Family Medicine

## 2018-02-17 ENCOUNTER — Ambulatory Visit (INDEPENDENT_AMBULATORY_CARE_PROVIDER_SITE_OTHER): Payer: Medicare Other | Admitting: Family Medicine

## 2018-02-17 VITALS — BP 122/62 | HR 60 | Temp 98.0°F | Resp 16 | Wt 170.0 lb

## 2018-02-17 DIAGNOSIS — R5383 Other fatigue: Secondary | ICD-10-CM | POA: Diagnosis not present

## 2018-02-17 DIAGNOSIS — I2510 Atherosclerotic heart disease of native coronary artery without angina pectoris: Secondary | ICD-10-CM

## 2018-02-17 DIAGNOSIS — I442 Atrioventricular block, complete: Secondary | ICD-10-CM

## 2018-02-17 DIAGNOSIS — C642 Malignant neoplasm of left kidney, except renal pelvis: Secondary | ICD-10-CM

## 2018-02-17 DIAGNOSIS — I4891 Unspecified atrial fibrillation: Secondary | ICD-10-CM

## 2018-02-17 DIAGNOSIS — E291 Testicular hypofunction: Secondary | ICD-10-CM

## 2018-02-17 DIAGNOSIS — M109 Gout, unspecified: Secondary | ICD-10-CM | POA: Diagnosis not present

## 2018-02-17 DIAGNOSIS — E782 Mixed hyperlipidemia: Secondary | ICD-10-CM

## 2018-02-17 MED ORDER — COLCHICINE 0.6 MG PO TABS: 0.6000 mg | ORAL_TABLET | Freq: Every day | ORAL | 5 refills | Status: DC | PRN

## 2018-02-17 NOTE — Progress Notes (Signed)
Patient: Thomas [name] Male    DOB: [birth year]   84 y.o.   MRN: [id number] Visit Date: 02/17/2018  Today's Provider: [name], MD   Chief Complaint  Patient presents with  . Gout   Subjective:    HPI Patient comes in today c/o swelling in his ankle. He reports that he has history of gout and he has had to adjust his gout medication due to his creatine levels going up. He is currently taking allopurinol 50mg  daily along with the colcrys as needed to keep his symptoms away. However, he feels that this has not helped in the last few weeks. Patient is wanting to try a different regime to help control his gout. He has tried Uloric, but had to discontinue due to increase heart risks. Whenever he gets a sparin he thinks it sometimes turns into a gout attack. He is recovering from some chronic medical issues and is getting his strength back and exercising more.      Allergies  Allergen Reactions  . Iodine Hives and Other (See Comments)    IVP contrast  . Penicillins Itching, Rash and Other (See Comments)    ITCHY FEELING IN FINGERS Has patient had a PCN reaction causing immediate rash, facial/tongue/throat swelling, SOB or lightheadedness with hypotension: No Has patient had a PCN reaction causing severe rash involving mucus membranes or skin necrosis: No Has patient had a PCN reaction that required hospitalization No Has patient had a PCN reaction occurring within the last 10 years: No If all of the above answers are "NO", then may proceed with Cephalosporin use.      Current Outpatient Medications:  .  allopurinol (ZYLOPRIM) 100 MG tablet, Take 1 tablet (100 mg total) by mouth daily., Disp: 30 tablet, Rfl: 12 .  amLODipine (NORVASC) 5 MG tablet, Take 1 tablet (5 mg total) by mouth daily., Disp: 180 tablet, Rfl: 4 .  apixaban (ELIQUIS) 2.5 MG TABS tablet, Take 2.5 mg by mouth 2 (two) times daily., Disp: , Rfl:  .  atorvastatin (LIPITOR) 40 MG tablet, TAKE 1 TABLET BY  MOUTH EVERY DAY AT 6PM, Disp: 90 tablet, Rfl: 2 .  Cholecalciferol (VITAMIN D3) 5000 units CAPS, Take 5,000 Units by mouth daily., Disp: , Rfl:  .  Coenzyme Q10 (COQ10) 100 MG CAPS, Take 100 mg by mouth daily., Disp: , Rfl:  .  colchicine (COLCRYS) 0.6 MG tablet, Take 1 tablet (0.6 mg total) by mouth daily as needed (for gout)., Disp: 30 tablet, Rfl: 5 .  diclofenac sodium (VOLTAREN) 1 % GEL, Apply 2 g topically daily as needed (for pain). , Disp: , Rfl:  .  dofetilide (TIKOSYN) 125 MCG capsule, Take 1 capsule (125 mcg total) by mouth 2 (two) times daily., Disp: 180 capsule, Rfl: 1 .  dutasteride (AVODART) 0.5 MG capsule, Take 0.5 mg by mouth daily as needed (for urination). , Disp: , Rfl:  .  esomeprazole (NEXIUM) 40 MG capsule, Take 40 mg by mouth daily as needed (for heartburn). , Disp: , Rfl:  .  febuxostat (ULORIC) 40 MG tablet, Take 20 mg by mouth daily. , Disp: , Rfl:  .  fluticasone furoate-vilanterol (BREO ELLIPTA) 100-25 MCG/INH AEPB, Inhale 1 puff into the lungs daily., Disp: 1 each, Rfl: 5 .  hydrALAZINE (APRESOLINE) 25 MG tablet, Take 1 tablet (25 mg total) by mouth 3 (three) times daily as needed. (Patient taking differently: Take 25 mg by mouth 3 (three) times daily as needed (for  BP > 150). ), Disp: 90 tablet, Rfl: 6 .  doxycycline (VIBRA-TABS) 100 MG tablet, Take 1 tablet (100 mg total) by mouth 2 (two) times daily. (Patient not taking: Reported on 01/01/2018), Disp: 20 tablet, Rfl: 4 .  metoprolol succinate (TOPROL-XL) 50 MG 24 hr tablet, TAKE 1 TABLET BY MOUTH DAILY. TAKE WITH OR IMMEDIATELY FOLLOWING A MEAL., Disp: 90 tablet, Rfl: 3 .  metroNIDAZOLE (FLAGYL) 500 MG tablet, Take 1 tablet (500 mg total) by mouth See admin instructions. Take 500 mg by mouth twice daily on Sunday and Monday, Disp: 30 tablet, Rfl: 11 .  Multiple Vitamins-Minerals (CENTRUM SILVER 50+MEN PO), Take 1 tablet by mouth daily., Disp: , Rfl:  .  NON FORMULARY, Testosterone gel compound - apply 1 cc per day,  Disp: , Rfl:  .  propranolol (INDERAL) 40 MG tablet, TAKE ONE (1) TABLET THREE (3) TIMES EACHDAY AS NEEDED (Patient taking differently: TAKE ONE (1) TABLET THREE (3) TIMES daily AS NEEDED for afib), Disp: 270 tablet, Rfl: 1 .  propranolol (INDERAL) 40 MG tablet, Take 1 tablet (40 mg total) by mouth 3 (three) times daily as needed., Disp: 30 tablet, Rfl: 2 .  silodosin (RAPAFLO) 8 MG CAPS capsule, Take 8 mg by mouth daily as needed (for urintation). , Disp: , Rfl:  .  temazepam (RESTORIL) 30 MG capsule, temazepam 30 mg capsule, Disp: , Rfl:  .  terazosin (HYTRIN) 1 MG capsule, Take 1 mg by mouth at bedtime., Disp: , Rfl:  .  testosterone cypionate (DEPOTESTOSTERONE CYPIONATE) 200 MG/ML injection, Inject 1 mL (200 mg total) into the muscle every 28 (twenty-eight) days., Disp: 10 mL, Rfl: 5 .  traMADol (ULTRAM) 50 MG tablet, Take 50 mg by mouth every 6 (six) hours as needed for moderate pain. , Disp: , Rfl:  .  Turmeric 500 MG CAPS, Take 500 mg by mouth daily., Disp: , Rfl:  .  vitamin E 400 UNIT capsule, Take 400 Units by mouth daily., Disp: , Rfl:   Review of Systems  Constitutional: Negative for activity change, fatigue and fever.  Eyes: Negative.   Respiratory: Negative for cough and shortness of breath.   Cardiovascular: Negative for chest pain, palpitations and leg swelling.  Gastrointestinal: Negative.   Endocrine: Negative.   Musculoskeletal: Positive for arthralgias, joint swelling and myalgias.  Allergic/Immunologic: Negative.   Neurological: Negative for dizziness, weakness and light-headedness.  Psychiatric/Behavioral: Negative.     Social History   Tobacco Use  . Smoking status: Never Smoker  . Smokeless tobacco: Never Used  Substance Use Topics  . Alcohol use: Yes    Alcohol/week: 1.0 standard drinks    Types: 1 Glasses of wine per week   Objective:   BP 122/62 (BP Location: Left Arm, Patient Position: Sitting, Cuff Size: Normal)   Pulse 60   Temp 98 F (36.7 C)    Resp 16   Wt 170 lb (77.1 kg)   SpO2 96%   BMI 26.63 kg/m  Vitals:   02/17/18 1019  BP: 122/62  Pulse: 60  Resp: 16  Temp: 98 F (36.7 C)  SpO2: 96%  Weight: 170 lb (77.1 kg)     Physical Exam  Constitutional: He is oriented to person, place, and time. He appears well-developed and well-nourished.  HENT:  Head: Normocephalic and atraumatic.  Right Ear: External ear normal.  Left Ear: External ear normal.  Nose: Nose normal.  Eyes: Conjunctivae are normal. No scleral icterus.  Neck: No thyromegaly present.  Cardiovascular: Normal rate, regular  rhythm and normal heart sounds.  Pulmonary/Chest: Effort normal and breath sounds normal.  Abdominal: Soft.  Musculoskeletal: He exhibits no edema.  Neurological: He is alert and oriented to person, place, and time.  Skin: Skin is warm and dry.  Psychiatric: He has a normal mood and affect. His behavior is normal. Judgment and thought content normal.        Assessment & Plan:     1. Gout, unspecified cause, unspecified chronicity, unspecified site I am not sure he has gout but will try to safely get uric acid below 6. More than 50% of 25 minute visit spent in review,counseling and coordination of care. I recommend against colchris. - Uric acid  2. Fatigue, unspecified type Improving - CBC with Differential/Platelet - Comprehensive metabolic panel  3. Atrial fibrillation, unspecified type ([place])  - CBC with Differential/Platelet - Comprehensive metabolic panel  4. Mixed hyperlipidemia  - Lipid Panel With LDL/HDL Ratio 5.CAD Pacemaker in place. All risk factors treated. 6.Renal Cell Ca 7.Hypogonadism Pt insists on therapy and accepts the risks.     I have done the exam and reviewed the above chart and it is accurate to the best of my knowledge. Development worker, community has been used in this note in any air is in the dictation or transcription are unintentional.  [name], MD  [facility]

## 2018-02-24 ENCOUNTER — Other Ambulatory Visit: Payer: Medicare Other

## 2018-02-24 DIAGNOSIS — E782 Mixed hyperlipidemia: Secondary | ICD-10-CM | POA: Diagnosis not present

## 2018-02-24 DIAGNOSIS — M109 Gout, unspecified: Secondary | ICD-10-CM | POA: Diagnosis not present

## 2018-02-24 DIAGNOSIS — I4891 Unspecified atrial fibrillation: Secondary | ICD-10-CM | POA: Diagnosis not present

## 2018-02-24 DIAGNOSIS — R5383 Other fatigue: Secondary | ICD-10-CM | POA: Diagnosis not present

## 2018-02-24 DIAGNOSIS — E291 Testicular hypofunction: Secondary | ICD-10-CM

## 2018-02-25 LAB — LIPID PANEL WITH LDL/HDL RATIO
Cholesterol, Total: 141 mg/dL (ref 100–199)
HDL: 59 mg/dL (ref >39)
LDL Calculated: 74 mg/dL (ref 0–99)
LDl/HDL Ratio: 1.3 ratio (ref 0.0–3.6)
Triglycerides: 39 mg/dL (ref 0–149)
VLDL Cholesterol Cal: 8 mg/dL (ref 5–40)

## 2018-02-25 LAB — COMPREHENSIVE METABOLIC PANEL
A/G RATIO: 1.7 (ref 1.2–2.2)
ALT: 18 IU/L (ref 0–44)
AST: 23 IU/L (ref 0–40)
Albumin: 4.2 g/dL (ref 3.5–4.7)
Alkaline Phosphatase: 63 IU/L (ref 39–117)
BUN/Creatinine Ratio: 12 (ref 10–24)
BUN: 19 mg/dL (ref 8–27)
Bilirubin Total: 0.6 mg/dL (ref 0.0–1.2)
CALCIUM: 9.4 mg/dL (ref 8.6–10.2)
CO2: 26 mmol/L (ref 20–29)
Chloride: 98 mmol/L (ref 96–106)
Creatinine, Ser: 1.55 mg/dL — ABNORMAL HIGH (ref 0.76–1.27)
GFR calc Af Amer: 47 mL/min/{1.73_m2} — ABNORMAL LOW (ref >59)
GFR, EST NON AFRICAN AMERICAN: 41 mL/min/{1.73_m2} — AB (ref >59)
Globulin, Total: 2.5 g/dL (ref 1.5–4.5)
Glucose: 91 mg/dL (ref 65–99)
POTASSIUM: 4.5 mmol/L (ref 3.5–5.2)
SODIUM: 137 mmol/L (ref 134–144)
Total Protein: 6.7 g/dL (ref 6.0–8.5)

## 2018-02-25 LAB — URIC ACID: Uric Acid: 6.3 mg/dL (ref 3.7–8.6)

## 2018-02-25 LAB — CBC WITH DIFFERENTIAL/PLATELET
Basophils Absolute: 0.1 10*3/uL (ref 0.0–0.2)
Basos: 1 %
EOS (ABSOLUTE): 0.1 10*3/uL (ref 0.0–0.4)
EOS: 2 %
HEMATOCRIT: 51.3 % — AB (ref 37.5–51.0)
Hemoglobin: 16.8 g/dL (ref 13.0–17.7)
IMMATURE GRANULOCYTES: 0 %
Immature Grans (Abs): 0 10*3/uL (ref 0.0–0.1)
LYMPHS ABS: 1.5 10*3/uL (ref 0.7–3.1)
Lymphs: 30 %
MCH: 30.1 pg (ref 26.6–33.0)
MCHC: 32.7 g/dL (ref 31.5–35.7)
MCV: 92 fL (ref 79–97)
MONOS ABS: 0.8 10*3/uL (ref 0.1–0.9)
Monocytes: 16 %
NEUTROS PCT: 51 %
Neutrophils Absolute: 2.6 10*3/uL (ref 1.4–7.0)
Platelets: 188 10*3/uL (ref 150–450)
RBC: 5.58 x10E6/uL (ref 4.14–5.80)
RDW: 13.5 % (ref 12.3–15.4)
WBC: 5.1 10*3/uL (ref 3.4–10.8)

## 2018-02-25 LAB — TESTOSTERONE: TESTOSTERONE: 646 ng/dL (ref 264–916)

## 2018-02-26 ENCOUNTER — Telehealth: Payer: Self-pay

## 2018-02-26 NOTE — Telephone Encounter (Signed)
-----   Message from Abbie Sons, MD sent at 02/26/2018  8:32 AM EDT ----- T level looks good at 646

## 2018-02-27 ENCOUNTER — Telehealth: Payer: Self-pay

## 2018-02-27 NOTE — Telephone Encounter (Signed)
-----   Message from Jerrol Banana., MD sent at 02/27/2018  9:36 AM EDT ----- Labs stable--would increase allopurinol from 100 to 200mg  daily.

## 2018-02-27 NOTE — Telephone Encounter (Signed)
Left message to call back  

## 2018-03-04 MED ORDER — ALLOPURINOL 100 MG PO TABS: 200.0000 mg | ORAL_TABLET | Freq: Every day | ORAL | 1 refills | Status: DC

## 2018-03-04 NOTE — Telephone Encounter (Signed)
Pt advised.  New RX sent to [facility].    Thanks,   -Mickel Baas

## 2018-03-04 NOTE — Telephone Encounter (Signed)
LMTCB 03/04/2018  Thanks,   -Mickel Baas

## 2018-03-04 NOTE — Telephone Encounter (Signed)
Pt returned Laura's missed call. °Please call pt back. ° °Thanks, °[facility] °

## 2018-03-07 ENCOUNTER — Telehealth: Payer: Self-pay | Admitting: Physician Assistant

## 2018-03-07 ENCOUNTER — Other Ambulatory Visit: Payer: Self-pay | Admitting: Internal Medicine

## 2018-03-07 NOTE — Telephone Encounter (Signed)
Received page from [facility] at [facility] about patient. They submitted full refill of Tikosyn to Dr. Caryl Comes today for formal review which is pending but need a short term rx sent in for while he is on vacation for a week. They have in stock to fill - have authorized 10 day rx of Tikosyn as per dose on file; 14mcg BID, disp #20 with zero refills. He acknowledged rx.  Dayna Dunn PA-C

## 2018-03-07 NOTE — Telephone Encounter (Signed)
The patient called the answering service after-hours today. He was inquiring about short term rx which was already addressed in prior phone note. He also wanted to confirm formal refill request was sent in from his pharmacy - > sent to Dr. Caryl Comes to address per Epic. Will route as FYI to [facility] triage to make sure this is addressed as I cannot act on that refill request (I did send in 10 day cross cover rx per prior phone note). The patient verbalized understanding and gratitude. Kenyia Wambolt PA-C

## 2018-03-09 ENCOUNTER — Telehealth: Payer: Self-pay | Admitting: Internal Medicine

## 2018-03-09 MED ORDER — DOFETILIDE 125 MCG PO CAPS: 125.0000 ug | ORAL_CAPSULE | Freq: Two times a day (BID) | ORAL | 1 refills | Status: DC

## 2018-03-09 NOTE — Telephone Encounter (Signed)
Lmov for patient to call and schedule 6m fu appointment with Dr Klein  °

## 2018-03-09 NOTE — Telephone Encounter (Signed)
-----   Message from Emily Filbert, RN sent at 03/09/2018 11:00 AM EST ----- I was reviewing this patient's chart for a refill. We have seen him in [place], but his last OV was in [place] in June w/ instructions to follow up in 6 months in [place]. No recall was placed.  Please call the patient to schedule for Tikosyn follow up in December with Dr. Caryl Comes.  Thanks!!

## 2018-03-09 NOTE — Addendum Note (Signed)
Addended byAlvis Lemmings C on: 03/09/2018 11:09 AM   Modules accepted: Orders

## 2018-03-09 NOTE — Telephone Encounter (Signed)
Outpatient Medication Detail    Disp Refills Start End   dofetilide (TIKOSYN) 125 MCG capsule 180 capsule 1 03/09/2018    Sig - Route: Take 1 capsule (125 mcg total) by mouth 2 (two) times daily. - Oral   Sent to pharmacy as: dofetilide (TIKOSYN) 125 MCG capsule   E-Prescribing Status: Receipt confirmed by pharmacy (03/09/2018 11:08 AM EST)   [place], [place]

## 2018-03-09 NOTE — Telephone Encounter (Signed)
Tikosyn 125 mcg BID sent in to [facility]- #180 w/ 1 refill.  Staff message sent to [facility] scheduling pool to please contact the patient to schedule a 6 month f/u appointment with Dr. Caryl Comes in December.

## 2018-03-10 LAB — CUP PACEART REMOTE DEVICE CHECK
Battery Remaining Longevity: 121 mo
Battery Voltage: 3.01 V
Brady Statistic AP VS Percent: 0.03 %
Brady Statistic AS VS Percent: 0.31 %
Brady Statistic RV Percent Paced: 99.64 %
Implantable Lead Implant Date: 20180214
Implantable Lead Implant Date: 20180214
Implantable Lead Model: 5076
Implantable Lead Model: 5076
Implantable Pulse Generator Implant Date: 20180214
Lead Channel Impedance Value: 456 Ohm
Lead Channel Pacing Threshold Pulse Width: 0.4 ms
Lead Channel Sensing Intrinsic Amplitude: 11 mV
Lead Channel Sensing Intrinsic Amplitude: 2.875 mV
Lead Channel Setting Pacing Amplitude: 2 V
Lead Channel Setting Pacing Amplitude: 2.5 V
Lead Channel Setting Sensing Sensitivity: 0.9 mV
MDC IDC LEAD LOCATION: 753859
MDC IDC LEAD LOCATION: 753860
MDC IDC MSMT LEADCHNL RA IMPEDANCE VALUE: 342 Ohm
MDC IDC MSMT LEADCHNL RA IMPEDANCE VALUE: 437 Ohm
MDC IDC MSMT LEADCHNL RA PACING THRESHOLD AMPLITUDE: 0.5 V
MDC IDC MSMT LEADCHNL RA PACING THRESHOLD PULSEWIDTH: 0.4 ms
MDC IDC MSMT LEADCHNL RA SENSING INTR AMPL: 2.875 mV
MDC IDC MSMT LEADCHNL RV IMPEDANCE VALUE: 380 Ohm
MDC IDC MSMT LEADCHNL RV PACING THRESHOLD AMPLITUDE: 0.75 V
MDC IDC MSMT LEADCHNL RV SENSING INTR AMPL: 11 mV
MDC IDC SESS DTM: 20190926053256
MDC IDC SET LEADCHNL RV PACING PULSEWIDTH: 0.4 ms
MDC IDC STAT BRADY AP VP PERCENT: 85.57 %
MDC IDC STAT BRADY AS VP PERCENT: 14.09 %
MDC IDC STAT BRADY RA PERCENT PACED: 74.62 %

## 2018-03-11 NOTE — Telephone Encounter (Signed)
Lmov for patient to call and schedule 6m fu appointment with Dr Klein  °

## 2018-03-12 NOTE — Telephone Encounter (Signed)
Lmov for patient to call and schedule 32m fu appointment with Dr Caryl Comes

## 2018-03-16 NOTE — Telephone Encounter (Signed)
Patient is scheduled for 03/17/18

## 2018-03-17 ENCOUNTER — Encounter: Payer: Self-pay | Admitting: Internal Medicine

## 2018-03-17 ENCOUNTER — Ambulatory Visit (INDEPENDENT_AMBULATORY_CARE_PROVIDER_SITE_OTHER): Payer: Medicare Other | Admitting: Internal Medicine

## 2018-03-17 VITALS — BP 138/84 | HR 70 | Ht 67.0 in | Wt 169.2 lb

## 2018-03-17 DIAGNOSIS — Z79899 Other long term (current) drug therapy: Secondary | ICD-10-CM | POA: Diagnosis not present

## 2018-03-17 DIAGNOSIS — I4819 Other persistent atrial fibrillation: Secondary | ICD-10-CM

## 2018-03-17 DIAGNOSIS — I442 Atrioventricular block, complete: Secondary | ICD-10-CM

## 2018-03-17 DIAGNOSIS — Z950 Presence of cardiac pacemaker: Secondary | ICD-10-CM | POA: Diagnosis not present

## 2018-03-17 MED ORDER — NEBIVOLOL HCL 5 MG PO TABS: 5.0000 mg | ORAL_TABLET | Freq: Every day | ORAL | 3 refills | Status: DC

## 2018-03-17 NOTE — Progress Notes (Signed)
Patient Care Team: Jerrol Banana., MD as PCP - General (Family Medicine) Rockey Situ Kathlene November, MD as Consulting Physician (Cardiology) Deboraha Sprang, MD as Consulting Physician (Cardiology) Leandrew Koyanagi, MD as Referring Physician (Ophthalmology) Serena Croissant, MD as Consulting Physician (Family Medicine) Wellington Hampshire, MD as Consulting Physician (Cardiology)   HPI  Thomas [name] is a 84 y.o. male Seen in follow-up for pacemaker implanted 2/18 for complete heart block. He also has paroxysmal atrial flutter  on anticoagulation his CHADS-VASc score is greater than or equal to 6 with a prior TIA.  Echocardiogram 2/18 EF 55-60% with normal right-sided function -this represented interval normalization  DATE TEST EF   2/18 Echo   55-60 %   6/19 LHC   3 V CAD moderate>>medical Rx           With worsening shortness of breath he was admitted for Tikosyn.  This was successful in restoring sinus rhythm.  Pre-evaluation for coronary disease included a CT scan which demonstrated pneumonia.  Followup CXR 7/19 full resolution   Shortness of breath is considerably better.  He is able to run again a couple of miles at a time.  His biggest complaint is sleepiness.  He wonders whether this is related to nightmares.  He does take a beta-blocker; this is been long-standing.   On Anticoagulation;  No bleeding issues    No interval awareness of atrial fibrillation   Date Cr K Mg Hgb  6/18 1.46   14.4  5/19 1.47 4.3 2.0 16  10/19 1.55 4.5  16.8       Past Medical History:  Diagnosis Date  . Aortic valve disorders   . Arthritis   . Atrial flutter ([place]) 06/18/2016   "AF or AFl; not sure which" (06/23/2016)  . Basal cell carcinoma    "face, nose left shoulder, left arm" (06/19/2016)  . BBB (bundle branch block)    hx right  . Chronic back pain    "neck, thoracic, lower back" (06/19/2016)  . Complete heart block ([place]) 06/2016  . Dyspnea   . GERD  (gastroesophageal reflux disease)   . Heart block    "I've had type I, II [place] before now" (06/19/2016)  . History of gout   . History of hiatal hernia    "self dx'd" (06/19/2016)  . Hyperlipidemia   . Hypertension   . Lyme disease    "dx'd by me 2003; cx's showed dx 08/2015"  . Migraine    "3-4/year" (06/19/2016)  . Presence of permanent cardiac pacemaker 06/19/2016  . PVC's (premature ventricular contractions)   . Renal cancer, left ([place]) 2006   S/P cryotherapy  . Spinal stenosis    "cervical, 1 thoracic, lumbar" (06/19/2016)  . Squamous carcinoma    "face, nose left shoulder, left arm" (06/19/2016)  . Stroke ([place])   . TIA (transient ischemic attack) 06/14/2016   "I'm not sure that's what it was" (06/25/2016)  . Visit for monitoring Tikosyn therapy 09/09/2017    Past Surgical History:  Procedure Laterality Date  . ANKLE FRACTURE SURGERY Right 1967  . BASAL CELL CARCINOMA EXCISION     "face, nose left shoulder, left arm"  . BIOPSY PROSTATE  2001 & 2003  . CARDIAC CATHETERIZATION  1990's  . CARDIOVERSION N/A 09/11/2017   Procedure: CARDIOVERSION;  Surgeon: Lelon Perla, MD;  Location[facility] ENDOSCOPY;  Service: Cardiovascular;  Laterality: N/A;  . FRACTURE SURGERY    . INGUINAL HERNIA REPAIR  Left 2012  . INSERT / REPLACE / REMOVE PACEMAKER  06/19/2016  . LAPAROSCOPIC ABLATION RENAL MASS    . LEFT HEART CATH AND CORONARY ANGIOGRAPHY Left 10/23/2017   Procedure: LEFT HEART CATH AND CORONARY ANGIOGRAPHY;  Surgeon: Wellington Hampshire, MD;  Location: [facility] CV LAB;  Service: Cardiovascular;  Laterality: Left;  . PACEMAKER IMPLANT N/A 06/19/2016   Procedure: Pacemaker Implant;  Surgeon: Deboraha Sprang, MD;  Location: [place] CV LAB;  Service: Cardiovascular;  Laterality: N/A;  . pacemasker    . PROSTATE SURGERY    . SQUAMOUS CELL CARCINOMA EXCISION     "face, nose left shoulder, left arm"  . TONSILLECTOMY AND ADENOIDECTOMY      Current Outpatient Medications  Medication  Sig Dispense Refill  . allopurinol (ZYLOPRIM) 100 MG tablet Take 2 tablets (200 mg total) by mouth daily. 180 tablet 1  . amLODipine (NORVASC) 5 MG tablet Take 1 tablet (5 mg total) by mouth daily. 180 tablet 4  . apixaban (ELIQUIS) 2.5 MG TABS tablet Take 2.5 mg by mouth 2 (two) times daily.    Marland Kitchen atorvastatin (LIPITOR) 40 MG tablet TAKE 1 TABLET BY MOUTH EVERY DAY AT 6PM 90 tablet 2  . Cholecalciferol (VITAMIN D3) 5000 units CAPS Take 5,000 Units by mouth daily.    . Coenzyme Q10 (COQ10) 100 MG CAPS Take 100 mg by mouth daily.    . colchicine (COLCRYS) 0.6 MG tablet Take 1 tablet (0.6 mg total) by mouth daily as needed (for gout). 30 tablet 5  . diclofenac sodium (VOLTAREN) 1 % GEL Apply 2 g topically daily as needed (for pain).     Marland Kitchen dofetilide (TIKOSYN) 125 MCG capsule Take 1 capsule (125 mcg total) by mouth 2 (two) times daily. 180 capsule 1  . doxycycline (VIBRA-TABS) 100 MG tablet Take 1 tablet (100 mg total) by mouth 2 (two) times daily. (Patient taking differently: Take 100 mg by mouth 2 (two) times daily as needed. ) 20 tablet 4  . dutasteride (AVODART) 0.5 MG capsule Take 0.5 mg by mouth daily as needed (for urination).     Marland Kitchen esomeprazole (NEXIUM) 40 MG capsule Take 40 mg by mouth daily as needed (for heartburn).     . fluticasone furoate-vilanterol (BREO ELLIPTA) 100-25 MCG/INH AEPB Inhale 1 puff into the lungs daily. 1 each 5  . hydrALAZINE (APRESOLINE) 25 MG tablet Take 1 tablet (25 mg total) by mouth 3 (three) times daily as needed. (Patient taking differently: Take 25 mg by mouth 3 (three) times daily as needed (for BP > 150). ) 90 tablet 6  . metoprolol succinate (TOPROL-XL) 50 MG 24 hr tablet TAKE 1 TABLET BY MOUTH DAILY. TAKE WITH OR IMMEDIATELY FOLLOWING A MEAL. 90 tablet 3  . metroNIDAZOLE (FLAGYL) 500 MG tablet Take 1 tablet (500 mg total) by mouth See admin instructions. Take 500 mg by mouth twice daily on Sunday and Monday 30 tablet 11  . Multiple Vitamins-Minerals (CENTRUM  SILVER 50+MEN PO) Take 1 tablet by mouth daily.    . NON FORMULARY Testosterone gel compound - apply 1 cc per day    . propranolol (INDERAL) 40 MG tablet TAKE ONE (1) TABLET THREE (3) TIMES EACHDAY AS NEEDED (Patient taking differently: TAKE ONE (1) TABLET THREE (3) TIMES daily AS NEEDED for afib) 270 tablet 1  . silodosin (RAPAFLO) 8 MG CAPS capsule Take 8 mg by mouth daily as needed (for urintation).     . temazepam (RESTORIL) 30 MG capsule temazepam 30 mg  capsule    . terazosin (HYTRIN) 1 MG capsule Take 1 mg by mouth at bedtime.    Marland Kitchen testosterone cypionate (DEPOTESTOSTERONE CYPIONATE) 200 MG/ML injection Inject 1 mL (200 mg total) into the muscle every 28 (twenty-eight) days. 10 mL 5  . traMADol (ULTRAM) 50 MG tablet Take 50 mg by mouth every 6 (six) hours as needed for moderate pain.     . Turmeric 500 MG CAPS Take 500 mg by mouth daily.    . vitamin E 400 UNIT capsule Take 400 Units by mouth daily.     No current facility-administered medications for this visit.     Allergies  Allergen Reactions  . Iodine Hives and Other (See Comments)    IVP contrast  . Penicillins Itching, Rash and Other (See Comments)    ITCHY FEELING IN FINGERS Has patient had a PCN reaction causing immediate rash, facial/tongue/throat swelling, SOB or lightheadedness with hypotension: No Has patient had a PCN reaction causing severe rash involving mucus membranes or skin necrosis: No Has patient had a PCN reaction that required hospitalization No Has patient had a PCN reaction occurring within the last 10 years: No If all of the above answers are "NO", then may proceed with Cephalosporin use.       Review of Systems negative except from HPI and PMH  Physical Exam BP 138/84 (BP Location: Left Arm, Patient Position: Sitting, Cuff Size: Normal)   Pulse 70   Ht 5\' 7"  (1.702 m)   Wt 169 lb 4 oz (76.8 kg)   BMI 26.51 kg/m  Well developed and nourished in no acute distress HENT normal Neck supple with  JVP-flat Carotids brisk and full without bruits Clear Regular rate and rhythm, 2/6 systolic m Abd-soft with active BS without hepatomegaly No Clubbing cyanosis edema Skin-warm and dry A & Oriented  Grossly normal sensory and motor function  ECG AV pacing 19/19/47  Assessment and Plan:  Atrial fibrillation-persistent  TIA  Coronary artery disease- mod medical therapy  Complete heart block    Hypertension  Pacemaker [employer]onic   Renal insufficiency grade 3  Sleep disordered breathing >>sleep study negative  Nightmares   Blood pressure is much improved  Functional status is also significantly improved.  His nightmares may be related to his metoprolol.  We will try him on nebivolol.  Decrease atrial fibrillation burden as detected by his device.  We will continue his Tikosyn and will need surveillance laboratories confirmed today.  On Anticoagulation;  No bleeding issues   Continue statins calcium blockers and beta-blockers for ischemic heart disease

## 2018-03-17 NOTE — Patient Instructions (Signed)
Medication Instructions:  - Your physician has recommended you make the following change in your medication:   1) STOP metoprolol 2) START bystolic 5 mg- take 1 tablet by mouth once daily  Samples given: Bystolic 5 mg Lot: [id number] Exp: 7/21 # 2 boxes given  If you need a refill on your cardiac medications before your next appointment, please call your pharmacy.   Lab work: - Your physician recommends that you have lab work today: Magnesium  If you have labs (blood work) drawn today and your tests are completely normal, you will receive your results only by: Marland Kitchen MyChart Message (if you have MyChart) OR . A paper copy in the mail If you have any lab test that is abnormal or we need to change your treatment, we will call you to review the results.  Testing/Procedures: - none ordered  Follow-Up: At [facility], you and your health needs are our priority.  As part of our continuing mission to provide you with exceptional heart care, we have created designated Provider Care Teams.  These Care Teams include your primary Cardiologist (physician) and Advanced Practice Providers (APPs -  Physician Assistants and Nurse Practitioners) who all work together to provide you with the care you need, when you need it. . You will need a follow up appointment in 6 months with Dr. Caryl Comes.  Please call our office 2 months in advance to schedule this appointment.   Remote monitoring is used to monitor your Pacemaker of ICD from home. This monitoring reduces the number of office visits required to check your device to one time per year. It allows [place] to keep an eye on the functioning of your device to ensure it is working properly. You are scheduled for a device check from home on 04/30/18. You may send your transmission at any time that day. If you have a wireless device, the transmission will be sent automatically. After your physician reviews your transmission, you will receive a postcard with your next  transmission date.  Any Other Special Instructions Will Be Listed Below (If Applicable). - N/A

## 2018-03-18 LAB — MAGNESIUM: Magnesium: 2.2 mg/dL (ref 1.6–2.3)

## 2018-03-24 ENCOUNTER — Other Ambulatory Visit: Payer: Self-pay

## 2018-03-24 ENCOUNTER — Other Ambulatory Visit: Payer: Self-pay | Admitting: Family Medicine

## 2018-03-24 DIAGNOSIS — E291 Testicular hypofunction: Secondary | ICD-10-CM

## 2018-03-24 MED ORDER — TESTOSTERONE 20.25 MG/ACT (1.62%) TD GEL: TRANSDERMAL | 5 refills | Status: DC

## 2018-03-24 NOTE — Telephone Encounter (Signed)
Ordered. Please send in Rx. Thanks!

## 2018-03-24 NOTE — Telephone Encounter (Signed)
Please advise advise testosterone gel?

## 2018-03-24 NOTE — Progress Notes (Signed)
Ordered. Ok per Dr. Darnell Level.

## 2018-03-24 NOTE — Telephone Encounter (Signed)
Pt contacted office for refill request on the following medications:  testosterone gel  [facility]  Pt stated that it was the gel not the injection. Pt is requesting Rx today because he is going out of town. Please advise. Thanks TNP

## 2018-03-24 NOTE — Telephone Encounter (Signed)
Ok--Whatever dose he has been on.

## 2018-03-26 ENCOUNTER — Telehealth: Payer: Self-pay | Admitting: Urology

## 2018-03-26 DIAGNOSIS — E291 Testicular hypofunction: Secondary | ICD-10-CM

## 2018-03-26 MED ORDER — TESTOSTERONE 20.25 MG/ACT (1.62%) TD GEL: TRANSDERMAL | 2 refills | Status: DC

## 2018-03-26 NOTE — Telephone Encounter (Signed)
Pt stopped by office to see if you sent in RX to [facility].

## 2018-03-26 NOTE — Telephone Encounter (Signed)
The Rx has been sent.  Recommend follow-up testosterone level in 6 weeks.  Lab order was entered.

## 2018-03-28 ENCOUNTER — Ambulatory Visit (INDEPENDENT_AMBULATORY_CARE_PROVIDER_SITE_OTHER): Payer: Medicare Other

## 2018-03-28 DIAGNOSIS — Z23 Encounter for immunization: Secondary | ICD-10-CM

## 2018-03-30 DIAGNOSIS — L820 Inflamed seborrheic keratosis: Secondary | ICD-10-CM | POA: Diagnosis not present

## 2018-03-30 DIAGNOSIS — L821 Other seborrheic keratosis: Secondary | ICD-10-CM | POA: Diagnosis not present

## 2018-03-30 DIAGNOSIS — L570 Actinic keratosis: Secondary | ICD-10-CM | POA: Diagnosis not present

## 2018-03-30 DIAGNOSIS — Z1283 Encounter for screening for malignant neoplasm of skin: Secondary | ICD-10-CM | POA: Diagnosis not present

## 2018-03-30 DIAGNOSIS — Z85828 Personal history of other malignant neoplasm of skin: Secondary | ICD-10-CM | POA: Diagnosis not present

## 2018-03-30 DIAGNOSIS — L578 Other skin changes due to chronic exposure to nonionizing radiation: Secondary | ICD-10-CM | POA: Diagnosis not present

## 2018-04-30 ENCOUNTER — Ambulatory Visit (INDEPENDENT_AMBULATORY_CARE_PROVIDER_SITE_OTHER): Payer: Medicare Other

## 2018-04-30 DIAGNOSIS — I442 Atrioventricular block, complete: Secondary | ICD-10-CM | POA: Diagnosis not present

## 2018-04-30 NOTE — Progress Notes (Signed)
Remote pacemaker transmission.   

## 2018-05-02 LAB — CUP PACEART REMOTE DEVICE CHECK
Battery Voltage: 3 V
Brady Statistic AP VP Percent: 92 %
Brady Statistic AP VS Percent: 0.21 %
Brady Statistic AS VS Percent: 1 %
Brady Statistic RV Percent Paced: 98.79 %
Implantable Lead Implant Date: 20180214
Implantable Lead Location: 753859
Implantable Lead Model: 5076
Implantable Pulse Generator Implant Date: 20180214
Lead Channel Impedance Value: 342 Ohm
Lead Channel Impedance Value: 361 Ohm
Lead Channel Impedance Value: 418 Ohm
Lead Channel Impedance Value: 456 Ohm
Lead Channel Pacing Threshold Amplitude: 0.75 V
Lead Channel Pacing Threshold Pulse Width: 0.4 ms
Lead Channel Sensing Intrinsic Amplitude: 10.375 mV
Lead Channel Sensing Intrinsic Amplitude: 10.375 mV
Lead Channel Setting Pacing Amplitude: 2 V
Lead Channel Setting Pacing Amplitude: 2.5 V
Lead Channel Setting Pacing Pulse Width: 0.4 ms
Lead Channel Setting Sensing Sensitivity: 0.9 mV
MDC IDC LEAD IMPLANT DT: 20180214
MDC IDC LEAD LOCATION: 753860
MDC IDC MSMT BATTERY REMAINING LONGEVITY: 115 mo
MDC IDC MSMT LEADCHNL RA PACING THRESHOLD AMPLITUDE: 0.5 V
MDC IDC MSMT LEADCHNL RA PACING THRESHOLD PULSEWIDTH: 0.4 ms
MDC IDC MSMT LEADCHNL RA SENSING INTR AMPL: 2.25 mV
MDC IDC MSMT LEADCHNL RA SENSING INTR AMPL: 2.25 mV
MDC IDC SESS DTM: 20191226044655
MDC IDC STAT BRADY AS VP PERCENT: 6.79 %
MDC IDC STAT BRADY RA PERCENT PACED: 92.84 %

## 2018-05-07 ENCOUNTER — Other Ambulatory Visit: Payer: Medicare Other

## 2018-05-07 DIAGNOSIS — E291 Testicular hypofunction: Secondary | ICD-10-CM

## 2018-05-08 LAB — TESTOSTERONE: Testosterone: 1132 ng/dL — ABNORMAL HIGH (ref 264–916)

## 2018-05-11 ENCOUNTER — Encounter: Payer: Self-pay | Admitting: Family Medicine

## 2018-05-11 ENCOUNTER — Ambulatory Visit (INDEPENDENT_AMBULATORY_CARE_PROVIDER_SITE_OTHER): Payer: Medicare Other | Admitting: Family Medicine

## 2018-05-11 VITALS — BP 142/64 | HR 72 | Temp 97.5°F

## 2018-05-11 DIAGNOSIS — R079 Chest pain, unspecified: Secondary | ICD-10-CM | POA: Diagnosis not present

## 2018-05-11 DIAGNOSIS — A6920 Lyme disease, unspecified: Secondary | ICD-10-CM | POA: Diagnosis not present

## 2018-05-11 DIAGNOSIS — I442 Atrioventricular block, complete: Secondary | ICD-10-CM | POA: Diagnosis not present

## 2018-05-11 DIAGNOSIS — G459 Transient cerebral ischemic attack, unspecified: Secondary | ICD-10-CM | POA: Diagnosis not present

## 2018-05-11 DIAGNOSIS — I441 Atrioventricular block, second degree: Secondary | ICD-10-CM

## 2018-05-11 MED ORDER — DOXYCYCLINE HYCLATE 100 MG PO TABS: 100.0000 mg | ORAL_TABLET | Freq: Every day | ORAL | 5 refills | Status: DC

## 2018-05-11 MED ORDER — CEFDINIR 300 MG PO CAPS: 300.0000 mg | ORAL_CAPSULE | Freq: Every day | ORAL | 5 refills | Status: DC

## 2018-05-11 NOTE — Progress Notes (Signed)
Patient: Thomas [name] Male    DOB: 1932/11/20   85 y.o.   MRN: [id number] Visit Date: 05/11/2018  Today's Provider: [name], MD   No chief complaint on file.  Subjective:     HPI  Patient comes in today c/o chest pain. He reports that he has had symptoms for the last 2 days. He describes it as a dull ache. He reports that it does not get worse with activity. He has not taken anything for his symptoms. Denies headache, dizziness, or blurred vision. He does have history of CVA.  His wife comes with him.  She is worried because he wants to remain very active and exercise.  She is worried about not keeping an eye on him unless he feels like his heart is okay.  Describes the chest pain has been left-sided and a pressure.  It is not exertional.  He has no associated symptoms. He does not have a homeopathic physician now following his chronic Lymes..  Felt better on antibiotics.  He would like to go back on the combination of doxycycline cleaned and cefdinir.  He knows I am not completely comfortable treating this.  Allergies  Allergen Reactions  . Iodine Hives and Other (See Comments)    IVP contrast  . Penicillins Itching, Rash and Other (See Comments)    ITCHY FEELING IN FINGERS Has patient had a PCN reaction causing immediate rash, facial/tongue/throat swelling, SOB or lightheadedness with hypotension: No Has patient had a PCN reaction causing severe rash involving mucus membranes or skin necrosis: No Has patient had a PCN reaction that required hospitalization No Has patient had a PCN reaction occurring within the last 10 years: No If all of the above answers are "NO", then may proceed with Cephalosporin use.      Current Outpatient Medications:  .  allopurinol (ZYLOPRIM) 100 MG tablet, Take 2 tablets (200 mg total) by mouth daily., Disp: 180 tablet, Rfl: 1 .  amLODipine (NORVASC) 5 MG tablet, Take 1 tablet (5 mg total) by mouth daily., Disp: 180 tablet, Rfl: 4 .   apixaban (ELIQUIS) 2.5 MG TABS tablet, Take 2.5 mg by mouth 2 (two) times daily., Disp: , Rfl:  .  atorvastatin (LIPITOR) 40 MG tablet, TAKE 1 TABLET BY MOUTH EVERY DAY AT 6PM, Disp: 90 tablet, Rfl: 2 .  Cholecalciferol (VITAMIN D3) 5000 units CAPS, Take 5,000 Units by mouth daily., Disp: , Rfl:  .  Coenzyme Q10 (COQ10) 100 MG CAPS, Take 100 mg by mouth daily., Disp: , Rfl:  .  colchicine (COLCRYS) 0.6 MG tablet, Take 1 tablet (0.6 mg total) by mouth daily as needed (for gout)., Disp: 30 tablet, Rfl: 5 .  diclofenac sodium (VOLTAREN) 1 % GEL, Apply 2 g topically daily as needed (for pain). , Disp: , Rfl:  .  dofetilide (TIKOSYN) 125 MCG capsule, Take 1 capsule (125 mcg total) by mouth 2 (two) times daily., Disp: 180 capsule, Rfl: 1 .  doxycycline (VIBRA-TABS) 100 MG tablet, Take 1 tablet (100 mg total) by mouth 2 (two) times daily. (Patient taking differently: Take 100 mg by mouth 2 (two) times daily as needed. ), Disp: 20 tablet, Rfl: 4 .  dutasteride (AVODART) 0.5 MG capsule, Take 0.5 mg by mouth daily as needed (for urination). , Disp: , Rfl:  .  esomeprazole (NEXIUM) 40 MG capsule, Take 40 mg by mouth daily as needed (for heartburn). , Disp: , Rfl:  .  fluticasone furoate-vilanterol (BREO ELLIPTA)  100-25 MCG/INH AEPB, Inhale 1 puff into the lungs daily., Disp: 1 each, Rfl: 5 .  hydrALAZINE (APRESOLINE) 25 MG tablet, Take 1 tablet (25 mg total) by mouth 3 (three) times daily as needed. (Patient taking differently: Take 25 mg by mouth 3 (three) times daily as needed (for BP > 150). ), Disp: 90 tablet, Rfl: 6 .  metroNIDAZOLE (FLAGYL) 500 MG tablet, Take 1 tablet (500 mg total) by mouth See admin instructions. Take 500 mg by mouth twice daily on Sunday and Monday, Disp: 30 tablet, Rfl: 11 .  Multiple Vitamins-Minerals (CENTRUM SILVER 50+MEN PO), Take 1 tablet by mouth daily., Disp: , Rfl:  .  nebivolol (BYSTOLIC) 5 MG tablet, Take 1 tablet (5 mg total) by mouth daily., Disp: 90 tablet, Rfl: 3 .  NON  FORMULARY, Testosterone gel compound - apply 1 cc per day, Disp: , Rfl:  .  propranolol (INDERAL) 40 MG tablet, TAKE ONE (1) TABLET THREE (3) TIMES EACHDAY AS NEEDED (Patient taking differently: TAKE ONE (1) TABLET THREE (3) TIMES daily AS NEEDED for afib), Disp: 270 tablet, Rfl: 1 .  silodosin (RAPAFLO) 8 MG CAPS capsule, Take 8 mg by mouth daily as needed (for urintation). , Disp: , Rfl:  .  temazepam (RESTORIL) 30 MG capsule, temazepam 30 mg capsule, Disp: , Rfl:  .  terazosin (HYTRIN) 1 MG capsule, Take 1 mg by mouth at bedtime., Disp: , Rfl:  .  Testosterone 20.25 MG/ACT (1.62%) GEL, Apply 1 pump to each shoulder daily, Disp: 75 g, Rfl: 2 .  testosterone cypionate (DEPOTESTOSTERONE CYPIONATE) 200 MG/ML injection, Inject 1 mL (200 mg total) into the muscle every 28 (twenty-eight) days., Disp: 10 mL, Rfl: 5 .  traMADol (ULTRAM) 50 MG tablet, Take 50 mg by mouth every 6 (six) hours as needed for moderate pain. , Disp: , Rfl:  .  Turmeric 500 MG CAPS, Take 500 mg by mouth daily., Disp: , Rfl:  .  vitamin E 400 UNIT capsule, Take 400 Units by mouth daily., Disp: , Rfl:   Review of Systems  Constitutional: Negative for activity change, appetite change, chills, diaphoresis, fatigue, fever and unexpected weight change.  HENT: Negative.   Eyes: Negative.   Respiratory: Negative for cough and shortness of breath.   Cardiovascular: Positive for chest pain. Negative for palpitations and leg swelling.  Gastrointestinal: Negative.   Endocrine: Negative for cold intolerance, heat intolerance, polydipsia, polyphagia and polyuria.  Genitourinary: Negative.   Musculoskeletal: Positive for arthralgias.  Skin: Negative.   Allergic/Immunologic: Negative.   Neurological: Negative for dizziness and light-headedness.  Hematological: Negative.   Psychiatric/Behavioral: Negative.     Social History   Tobacco Use  . Smoking status: Never Smoker  . Smokeless tobacco: Never Used  Substance Use Topics  .  Alcohol use: Yes    Alcohol/week: 1.0 standard drinks    Types: 1 Glasses of wine per week      Objective:   BP (!) 142/64   SpO2 96%  Vitals:   05/11/18 1346  BP: (!) 142/64  SpO2: 96%     Physical Exam Constitutional:      Appearance: Normal appearance. He is well-developed.  HENT:     Head: Normocephalic and atraumatic.     Right Ear: External ear normal.     Left Ear: External ear normal.     Nose: Nose normal.     Mouth/Throat:     Pharynx: Oropharynx is clear.  Eyes:     General: No scleral icterus.  Conjunctiva/sclera: Conjunctivae normal.  Cardiovascular:     Rate and Rhythm: Normal rate and regular rhythm.     Pulses: Normal pulses.     Heart sounds: Normal heart sounds.  Pulmonary:     Effort: Pulmonary effort is normal.     Breath sounds: Normal breath sounds.  Chest:     Breasts:        Left: Tenderness present.     Comments: Mild chest wall tenderness on the left side with pectoralis muscle.  No rib tenderness.  Certainly no rashes.  Does not necessarily reproduce the pain. Abdominal:     Palpations: Abdomen is soft.  Lymphadenopathy:     Cervical: No cervical adenopathy.  Skin:    General: Skin is warm and dry.  Neurological:     Mental Status: He is alert.  Psychiatric:        Behavior: Behavior normal.        Thought Content: Thought content normal.        Judgment: Judgment normal.   ECG is unchanged from previous cardiogram.  No ischemic findings.      Assessment & Plan    1. Chest pain, unspecified type I do not think this is cardiac in origin.  However due to abnormal cath have set him up to see cardiology in the next couple of days.  He would take aspirin as he is on and he will limit his activity until cleared by cardiology.  EKG is unchanged today from previous. - EKG 12-Lead - CBC with Differential/Platelet - Renal function panel - Troponin I - D-Dimer, Quantitative  2. Lyme disease Wishes to have this treated as he is  having arthralgias in the hands.  Do this.  Will reassess in a few months.  Would like to  get him to infectious disease but I do not think he will agree to this.  50% of 35-minute visit is spent in counseling and coordination of care. - doxycycline (VIBRA-TABS) 100 MG tablet; Take 1 tablet (100 mg total) by mouth daily.  Dispense: 30 tablet; Refill: 5 - cefdinir (OMNICEF) 300 MG capsule; Take 1 capsule (300 mg total) by mouth daily.  Dispense: 30 capsule; Refill: 5 3.  Pacemaker   4. Complete heart block ([place])   5. TIA (transient ischemic attack) 6.  Hypogonadism Treated by urology.    I have done the exam and reviewed the above chart and it is accurate to the best of my knowledge. Development worker, community has been used in this note in any air is in the dictation or transcription are unintentional.  [name], MD  [place]

## 2018-05-12 ENCOUNTER — Encounter: Payer: Self-pay | Admitting: Urology

## 2018-05-12 LAB — CBC WITH DIFFERENTIAL/PLATELET
BASOS: 0 %
Basophils Absolute: 0 10*3/uL (ref 0.0–0.2)
EOS (ABSOLUTE): 0.1 10*3/uL (ref 0.0–0.4)
EOS: 1 %
HEMATOCRIT: 47.9 % (ref 37.5–51.0)
Hemoglobin: 16.5 g/dL (ref 13.0–17.7)
Immature Grans (Abs): 0 10*3/uL (ref 0.0–0.1)
Immature Granulocytes: 0 %
LYMPHS ABS: 2.1 10*3/uL (ref 0.7–3.1)
Lymphs: 30 %
MCH: 30.2 pg (ref 26.6–33.0)
MCHC: 34.4 g/dL (ref 31.5–35.7)
MCV: 88 fL (ref 79–97)
MONOS ABS: 0.8 10*3/uL (ref 0.1–0.9)
Monocytes: 12 %
Neutrophils Absolute: 4 10*3/uL (ref 1.4–7.0)
Neutrophils: 57 %
Platelets: 193 10*3/uL (ref 150–450)
RBC: 5.46 x10E6/uL (ref 4.14–5.80)
RDW: 13.8 % (ref 11.6–15.4)
WBC: 7.1 10*3/uL (ref 3.4–10.8)

## 2018-05-12 LAB — RENAL FUNCTION PANEL
Albumin: 4.4 g/dL (ref 3.5–4.7)
BUN / CREAT RATIO: 12 (ref 10–24)
BUN: 17 mg/dL (ref 8–27)
CO2: 26 mmol/L (ref 20–29)
CREATININE: 1.47 mg/dL — AB (ref 0.76–1.27)
Calcium: 10.1 mg/dL (ref 8.6–10.2)
Chloride: 96 mmol/L (ref 96–106)
GFR calc non Af Amer: 43 mL/min/{1.73_m2} — ABNORMAL LOW (ref >59)
GFR, EST AFRICAN AMERICAN: 50 mL/min/{1.73_m2} — AB (ref >59)
Glucose: 93 mg/dL (ref 65–99)
Phosphorus: 3.4 mg/dL (ref 2.5–4.5)
Potassium: 4.6 mmol/L (ref 3.5–5.2)
Sodium: 137 mmol/L (ref 134–144)

## 2018-05-12 LAB — TROPONIN I: Troponin I: <0.01 ng/mL (ref 0.00–0.04)

## 2018-05-12 LAB — D-DIMER, QUANTITATIVE: D-DIMER: 0.43 mg/L FEU (ref 0.00–0.49)

## 2018-05-13 ENCOUNTER — Telehealth: Payer: Self-pay

## 2018-05-13 NOTE — Telephone Encounter (Signed)
-----   Message from Jerrol Banana., MD sent at 05/12/2018  5:02 PM EST ----- Labs OK.

## 2018-05-13 NOTE — Telephone Encounter (Signed)
LVMTRC 

## 2018-05-14 ENCOUNTER — Ambulatory Visit: Payer: Medicare Other | Admitting: Cardiovascular Disease

## 2018-05-14 ENCOUNTER — Encounter: Payer: Self-pay | Admitting: Cardiovascular Disease

## 2018-05-14 VITALS — BP 120/80 | HR 73 | Ht 67.0 in | Wt 172.0 lb

## 2018-05-14 DIAGNOSIS — E785 Hyperlipidemia, unspecified: Secondary | ICD-10-CM

## 2018-05-14 DIAGNOSIS — I10 Essential (primary) hypertension: Secondary | ICD-10-CM

## 2018-05-14 DIAGNOSIS — I441 Atrioventricular block, second degree: Secondary | ICD-10-CM

## 2018-05-14 DIAGNOSIS — I2510 Atherosclerotic heart disease of native coronary artery without angina pectoris: Secondary | ICD-10-CM

## 2018-05-14 DIAGNOSIS — I4819 Other persistent atrial fibrillation: Secondary | ICD-10-CM | POA: Diagnosis not present

## 2018-05-14 NOTE — Telephone Encounter (Signed)
Patient was advised.  

## 2018-05-14 NOTE — Patient Instructions (Signed)
Medication Instructions:  No changes If you need a refill on your cardiac medications before your next appointment, please call your pharmacy.   Lab work: None If you have labs (blood work) drawn today and your tests are completely normal, you will receive your results only by: Marland Kitchen MyChart Message (if you have MyChart) OR . A paper copy in the mail If you have any lab test that is abnormal or we need to change your treatment, we will call you to review the results.  Testing/Procedures: None  Follow-Up: At [facility], you and your health needs are our priority.  As part of our continuing mission to provide you with exceptional heart care, we have created designated Provider Care Teams.  These Care Teams include your primary Cardiologist (physician) and Advanced Practice Providers (APPs -  Physician Assistants and Nurse Practitioners) who all work together to provide you with the care you need, when you need it. You will need a follow up appointment in 6 months.  Please call our office 2 months in advance to schedule this appointment.  You may see Dr. Fletcher Anon or one of the following Advanced Practice Providers on your designated Care Team:   Murray Hodgkins, NP Christell Faith, PA-C . Marrianne Mood, PA-C  Any Other Special Instructions Will Be Listed Below (If Applicable).

## 2018-05-14 NOTE — Progress Notes (Signed)
Cardiology Office Note   Date:  05/14/2018   ID:  Thomas [name], DOB 1933-03-21, MRN [id number]  PCP:  Jerrol Banana., MD  Cardiologist:   Dr. Rockey Situ  Chief Complaint  Patient presents with  . other    Chest pain per PCP OD 2 month f/u no complaints today. Meds reviewed verbally with pt.      History of Present Illness: Thomas [name] is a 85 y.o. male who presents for a follow-up visit regarding coronary artery disease.  He has known history of pacemaker placement in February 2018 for complete heart block, paroxysmal atrial flutter on anticoagulation with chads vas score of 6 with prior TIA, essential hypertension and hyperlipidemia.  He had shortness of breath in May when he had atrial arrhythmia.  He was started on Tikosyn with subsequent improvement.  He did have coronary CTA done which showed a calcium score of 1366 with possible significant stenosis in the mid LAD and 50% stenosis in the mid left circumflex.  The RCA could not be visualized due to pacemaker artifact. Cardiac catheterization in June 2019 showed borderline three-vessel coronary artery disease with 60% mid LAD stenosis, 50% proximal left circumflex stenosis and 70% mid RCA stenosis.  The coronary arteries were noted to be moderately calcified.  LVEDP was 18 mmHg.  No revascularization was performed.  He is very active and runs on a regular basis for exercise.  He has been doing that for many years.  Recently, he had left-sided chest pain that lasted for few days.  It was continuous and worse with certain movements.  It did not worsen with physical activities.  He was seen by Dr. Rosanna Randy and had labs done including troponin and d-dimer.  Both of them were negative.  He did run 2 days ago and did not have any symptoms.    Past Medical History:  Diagnosis Date  . Aortic valve disorders   . Arthritis   . Atrial flutter ([place]) 06/18/2016   "AF or AFl; not sure which" (06/23/2016)  . Basal cell carcinoma    "face,  nose left shoulder, left arm" (06/19/2016)  . BBB (bundle branch block)    hx right  . Chronic back pain    "neck, thoracic, lower back" (06/19/2016)  . Complete heart block ([place]) 06/2016  . Dyspnea   . GERD (gastroesophageal reflux disease)   . Heart block    "I've had type I, II [place] before now" (06/19/2016)  . History of gout   . History of hiatal hernia    "self dx'd" (06/19/2016)  . Hyperlipidemia   . Hypertension   . Lyme disease    "dx'd by me 2003; cx's showed dx 08/2015"  . Migraine    "3-4/year" (06/19/2016)  . Presence of permanent cardiac pacemaker 06/19/2016  . PVC's (premature ventricular contractions)   . Renal cancer, left ([place]) 2006   S/P cryotherapy  . Spinal stenosis    "cervical, 1 thoracic, lumbar" (06/19/2016)  . Squamous carcinoma    "face, nose left shoulder, left arm" (06/19/2016)  . Stroke ([place])   . TIA (transient ischemic attack) 06/14/2016   "I'm not sure that's what it was" (06/25/2016)  . Visit for monitoring Tikosyn therapy 09/09/2017    Past Surgical History:  Procedure Laterality Date  . ANKLE FRACTURE SURGERY Right 1967  . BASAL CELL CARCINOMA EXCISION     "face, nose left shoulder, left arm"  . BIOPSY PROSTATE  2001 & 2003  .  CARDIAC CATHETERIZATION  1990's  . CARDIOVERSION N/A 09/11/2017   Procedure: CARDIOVERSION;  Surgeon: Lelon Perla, MD;  [facility]: [facility] ENDOSCOPY;  Service: Cardiovascular;  Laterality: N/A;  . FRACTURE SURGERY    . INGUINAL HERNIA REPAIR Left 2012  . INSERT / REPLACE / REMOVE PACEMAKER  06/19/2016  . LAPAROSCOPIC ABLATION RENAL MASS    . LEFT HEART CATH AND CORONARY ANGIOGRAPHY Left 10/23/2017   Procedure: LEFT HEART CATH AND CORONARY ANGIOGRAPHY;  Surgeon: Wellington Hampshire, MD;  Location: [place] CV LAB;  Service: Cardiovascular;  Laterality: Left;  . PACEMAKER IMPLANT N/A 06/19/2016   Procedure: Pacemaker Implant;  Surgeon: Deboraha Sprang, MD;  Location: [facility] CV LAB;  Service: Cardiovascular;   Laterality: N/A;  . pacemasker    . PROSTATE SURGERY    . SQUAMOUS CELL CARCINOMA EXCISION     "face, nose left shoulder, left arm"  . TONSILLECTOMY AND ADENOIDECTOMY       Current Outpatient Medications  Medication Sig Dispense Refill  . allopurinol (ZYLOPRIM) 100 MG tablet Take 2 tablets (200 mg total) by mouth daily. 180 tablet 1  . amLODipine (NORVASC) 5 MG tablet Take 1 tablet (5 mg total) by mouth daily. 180 tablet 4  . apixaban (ELIQUIS) 2.5 MG TABS tablet Take 2.5 mg by mouth 2 (two) times daily.    Marland Kitchen atorvastatin (LIPITOR) 40 MG tablet TAKE 1 TABLET BY MOUTH EVERY DAY AT 6PM 90 tablet 2  . cefdinir (OMNICEF) 300 MG capsule Take 1 capsule (300 mg total) by mouth daily. 30 capsule 5  . Cholecalciferol (VITAMIN D3) 5000 units CAPS Take 5,000 Units by mouth daily.    . Coenzyme Q10 (COQ10) 100 MG CAPS Take 100 mg by mouth daily.    . colchicine (COLCRYS) 0.6 MG tablet Take 1 tablet (0.6 mg total) by mouth daily as needed (for gout). 30 tablet 5  . diclofenac sodium (VOLTAREN) 1 % GEL Apply 2 g topically daily as needed (for pain).     Marland Kitchen dofetilide (TIKOSYN) 125 MCG capsule Take 1 capsule (125 mcg total) by mouth 2 (two) times daily. 180 capsule 1  . doxycycline (VIBRA-TABS) 100 MG tablet Take 1 tablet (100 mg total) by mouth daily. 30 tablet 5  . dutasteride (AVODART) 0.5 MG capsule Take 0.5 mg by mouth daily as needed (for urination).     Marland Kitchen esomeprazole (NEXIUM) 40 MG capsule Take 40 mg by mouth daily as needed (for heartburn).     . hydrALAZINE (APRESOLINE) 25 MG tablet Take 1 tablet (25 mg total) by mouth 3 (three) times daily as needed. (Patient taking differently: Take 25 mg by mouth 3 (three) times daily as needed (for BP > 150). ) 90 tablet 6  . metroNIDAZOLE (FLAGYL) 500 MG tablet Take 1 tablet (500 mg total) by mouth See admin instructions. Take 500 mg by mouth twice daily on Sunday and Monday 30 tablet 11  . Multiple Vitamins-Minerals (CENTRUM SILVER 50+MEN PO) Take 1 tablet  by mouth daily.    . nebivolol (BYSTOLIC) 5 MG tablet Take 1 tablet (5 mg total) by mouth daily. 90 tablet 3  . NON FORMULARY Testosterone gel compound - apply 1 cc per day    . propranolol (INDERAL) 40 MG tablet TAKE ONE (1) TABLET THREE (3) TIMES EACHDAY AS NEEDED (Patient taking differently: TAKE ONE (1) TABLET THREE (3) TIMES daily AS NEEDED for afib) 270 tablet 1  . silodosin (RAPAFLO) 8 MG CAPS capsule Take 8 mg by mouth daily as  needed (for urintation).     . temazepam (RESTORIL) 30 MG capsule temazepam 30 mg capsule    . terazosin (HYTRIN) 1 MG capsule Take 1 mg by mouth at bedtime.    . Testosterone 20.25 MG/ACT (1.62%) GEL Apply 1 pump to each shoulder daily 75 g 2  . testosterone cypionate (DEPOTESTOSTERONE CYPIONATE) 200 MG/ML injection Inject 1 mL (200 mg total) into the muscle every 28 (twenty-eight) days. 10 mL 5  . traMADol (ULTRAM) 50 MG tablet Take 50 mg by mouth every 6 (six) hours as needed for moderate pain.     . Turmeric 500 MG CAPS Take 500 mg by mouth daily.    . vitamin E 400 UNIT capsule Take 400 Units by mouth daily.    . fluticasone furoate-vilanterol (BREO ELLIPTA) 100-25 MCG/INH AEPB Inhale 1 puff into the lungs daily. (Patient not taking: Reported on 05/14/2018) 1 each 5   No current facility-administered medications for this visit.     Allergies:   Iodine and Penicillins    Social History:  The patient  reports that he has never smoked. He has never used smokeless tobacco. He reports current alcohol use of about 1.0 standard drinks of alcohol per week. He reports that he does not use drugs.   Family History:  The patient's family history includes Aortic stenosis in his mother; Arthritis in his father; Heart attack in his brother; Stroke in his brother.    ROS:  Please see the history of present illness.   Otherwise, review of systems are positive for none.   All other systems are reviewed and negative.    PHYSICAL EXAM: VS:  BP 120/80 (BP Location: Left  Arm, Patient Position: Sitting, Cuff Size: Normal)   Pulse 73   Ht 5\' 7"  (1.702 m)   Wt 172 lb (78 kg)   BMI 26.94 kg/m  , BMI Body mass index is 26.94 kg/m. GEN: Well nourished, well developed, in no acute distress  HEENT: normal  Neck: no JVD, carotid bruits, or masses Cardiac: RRR; no murmurs, rubs, or gallops,no edema  Respiratory:  clear to auscultation bilaterally, normal work of breathing GI: soft, nontender, nondistended, + BS MS: no deformity or atrophy  Skin: warm and dry, no rash Neuro:  Strength and sensation are intact Psych: euthymic mood, full affect Right radial pulses normal with no hematoma.  EKG:  EKG is ordered today. The ekg ordered today demonstrates AV dual paced rhythm.   Recent Labs: 11/21/2017: TSH 2.260 02/24/2018: ALT 18 03/17/2018: Magnesium 2.2 05/11/2018: BUN 17; Creatinine, Ser 1.47; Hemoglobin 16.5; Platelets 193; Potassium 4.6; Sodium 137    Lipid Panel    Component Value Date/Time   CHOL 141 02/24/2018 1143   CHOL 144 06/26/2012 1106   TRIG 39 02/24/2018 1143   TRIG 46 06/26/2012 1106   HDL 59 02/24/2018 1143   HDL 61 (H) 06/26/2012 1106   CHOLHDL 2.2 11/21/2017 0955   CHOLHDL 2.4 06/14/2016 1346   VLDL 8 06/14/2016 1346   VLDL 9 06/26/2012 1106   LDLCALC 74 02/24/2018 1143   LDLCALC 74 06/26/2012 1106      Wt Readings from Last 3 Encounters:  05/14/18 172 lb (78 kg)  03/17/18 169 lb 4 oz (76.8 kg)  02/17/18 170 lb (77.1 kg)       No flowsheet data found.    ASSESSMENT AND PLAN:  1.  Coronary artery disease involving native coronary arteries without angina: Recent chest pain was atypical and sounds musculoskeletal in nature.  He has no exertional chest pain to suggest angina.  I recommend continuing medical therapy and monitoring his symptoms.  We could consider Lexiscan Myoview in the future to evaluate the physiologic significance of moderate stenosis.  2.  Persistent atrial fibrillation: Maintaining in sinus rhythm  with Tikosyn.  He is on anticoagulation with Eliquis.  3.  Essential hypertension: Blood pressure is controlled.  4.  Hyperlipidemia: Continue treatment with atorvastatin.  Most recent LDL was 74.   Disposition:   FU with me in 6 months  Signed,  [name], MD  05/14/2018 2:25 PM    [place]

## 2018-05-26 ENCOUNTER — Telehealth: Payer: Self-pay

## 2018-05-26 NOTE — Telephone Encounter (Signed)
Left message for patient to call office to let us know when his last testosterone injection was prior to his blood test.  Mychart message had been sent but no reply.

## 2018-05-27 ENCOUNTER — Telehealth: Payer: Self-pay

## 2018-05-27 NOTE — Telephone Encounter (Signed)
Spoke with patient regarding his last testosterone injection.  Patients say he had not had a shot in about a month prior to his lab work.

## 2018-06-11 ENCOUNTER — Other Ambulatory Visit: Payer: Self-pay | Admitting: Internal Medicine

## 2018-07-08 ENCOUNTER — Encounter: Payer: Self-pay | Admitting: Anesthesiology

## 2018-07-08 ENCOUNTER — Ambulatory Visit
Admission: RE | Admit: 2018-07-08 | Discharge: 2018-07-08 | Disposition: A | Discharge status: Home / Self Care | Payer: Medicare Other | Source: Ambulatory Visit | Attending: Anesthesiology | Admitting: Anesthesiology

## 2018-07-08 ENCOUNTER — Other Ambulatory Visit: Payer: Self-pay | Admitting: Anesthesiology

## 2018-07-08 ENCOUNTER — Other Ambulatory Visit: Payer: Self-pay

## 2018-07-08 ENCOUNTER — Ambulatory Visit (HOSPITAL_BASED_OUTPATIENT_CLINIC_OR_DEPARTMENT_OTHER): Payer: Medicare Other | Admitting: Anesthesiology

## 2018-07-08 VITALS — BP 161/82 | HR 70 | Temp 97.6°F | Resp 18 | Ht 67.0 in | Wt 172.0 lb

## 2018-07-08 DIAGNOSIS — R52 Pain, unspecified: Secondary | ICD-10-CM

## 2018-07-08 DIAGNOSIS — M5431 Sciatica, right side: Secondary | ICD-10-CM | POA: Insufficient documentation

## 2018-07-08 DIAGNOSIS — M5432 Sciatica, left side: Secondary | ICD-10-CM

## 2018-07-08 DIAGNOSIS — M48061 Spinal stenosis, lumbar region without neurogenic claudication: Secondary | ICD-10-CM

## 2018-07-08 DIAGNOSIS — M5136 Other intervertebral disc degeneration, lumbar region: Secondary | ICD-10-CM

## 2018-07-08 MED ORDER — IOPAMIDOL (ISOVUE-M 200) INJECTION 41%
INTRAMUSCULAR | Status: AC
  Filled 2018-07-08: qty 10

## 2018-07-08 MED ORDER — TRIAMCINOLONE ACETONIDE 40 MG/ML IJ SUSP
INTRAMUSCULAR | Status: AC
  Filled 2018-07-08: qty 1

## 2018-07-08 MED ORDER — ROPIVACAINE HCL 2 MG/ML IJ SOLN
INTRAMUSCULAR | Status: AC
  Filled 2018-07-08: qty 10

## 2018-07-08 MED ORDER — SODIUM CHLORIDE 0.9% FLUSH
10.0000 mL | Freq: Once | INTRAVENOUS | Status: AC
  Administered 2018-07-08: 10 mL

## 2018-07-08 MED ORDER — LIDOCAINE HCL (PF) 1 % IJ SOLN
5.0000 mL | Freq: Once | INTRAMUSCULAR | Status: AC
  Administered 2018-07-08: 5 mL via SUBCUTANEOUS

## 2018-07-08 MED ORDER — SODIUM CHLORIDE (PF) 0.9 % IJ SOLN
INTRAMUSCULAR | Status: AC
  Filled 2018-07-08: qty 10

## 2018-07-08 MED ORDER — IOPAMIDOL (ISOVUE-M 200) INJECTION 41%
20.0000 mL | Freq: Once | INTRAMUSCULAR | Status: DC | PRN
  Administered 2018-07-08: 10 mL
  Filled 2018-07-08: qty 20

## 2018-07-08 MED ORDER — TRIAMCINOLONE ACETONIDE 40 MG/ML IJ SUSP
40.0000 mg | Freq: Once | INTRAMUSCULAR | Status: AC
  Administered 2018-07-08: 40 mg

## 2018-07-08 MED ORDER — ROPIVACAINE HCL 2 MG/ML IJ SOLN
10.0000 mL | Freq: Once | INTRAMUSCULAR | Status: AC
  Administered 2018-07-08: 10 mL via EPIDURAL

## 2018-07-08 MED ORDER — LIDOCAINE HCL (PF) 1 % IJ SOLN
INTRAMUSCULAR | Status: AC
  Filled 2018-07-08: qty 10

## 2018-07-08 NOTE — Progress Notes (Signed)
Safety precautions to be maintained throughout the outpatient stay will include: orient to surroundings, keep bed in low position, maintain call bell within reach at all times, provide assistance with transfer out of bed and ambulation.  

## 2018-07-08 NOTE — Patient Instructions (Signed)
Pain Management Discharge Instructions  General Discharge Instructions :  If you need to reach your doctor call: Monday-Friday 8:00 am - 4:00 pm at [phone] or toll free [phone].  After clinic hours [phone] to have operator reach doctor.  Bring all of your medication bottles to all your appointments in the pain clinic.  To cancel or reschedule your appointment with Pain Management please remember to call 24 hours in advance to avoid a fee.  Refer to the educational materials which you have been given on: General Risks, I had my Procedure. Discharge Instructions, Post Sedation.  Post Procedure Instructions:  The drugs you were given will stay in your system until tomorrow, so for the next 24 hours you should not drive, make any legal decisions or drink any alcoholic beverages.  You may eat anything you prefer, but it is better to start with liquids then soups and crackers, and gradually work up to solid foods.  Please notify your doctor immediately if you have any unusual bleeding, trouble breathing or pain that is not related to your normal pain.  Depending on the type of procedure that was done, some parts of your body may feel week and/or numb.  This usually clears up by tonight or the next day.  Walk with the use of an assistive device or accompanied by an adult for the 24 hours.  You may use ice on the affected area for the first 24 hours.  Put ice in a Ziploc bag and cover with a towel and place against area 15 minutes on 15 minutes off.  You may switch to heat after 24 hours.Epidural Steroid Injection Patient Information  Description: The epidural space surrounds the nerves as they exit the spinal cord.  In some patients, the nerves can be compressed and inflamed by a bulging disc or a tight spinal canal (spinal stenosis).  By injecting steroids into the epidural space, we can bring irritated nerves into direct contact with a potentially helpful medication.  These  steroids act directly on the irritated nerves and can reduce swelling and inflammation which often leads to decreased pain.  Epidural steroids may be injected anywhere along the spine and from the neck to the low back depending upon the location of your pain.   After numbing the skin with local anesthetic (like Novocaine), a small needle is passed into the epidural space slowly.  You may experience a sensation of pressure while this is being done.  The entire block usually last less than 10 minutes.  Conditions which may be treated by epidural steroids:   Low back and leg pain  Neck and arm pain  Spinal stenosis  Post-laminectomy syndrome  Herpes zoster (shingles) pain  Pain from compression fractures  Preparation for the injection:  1. Do not eat any solid food or dairy products within 8 hours of your appointment.  2. You may drink clear liquids up to 3 hours before appointment.  Clear liquids include water, black coffee, juice or soda.  No milk or cream please. 3. You may take your regular medication, including pain medications, with a sip of water before your appointment  Diabetics should hold regular insulin (if taken separately) and take 1/2 normal NPH dos the morning of the procedure.  Carry some sugar containing items with you to your appointment. 4. A driver must accompany you and be prepared to drive you home after your procedure.  5. Bring all your current medications with your. 6. An IV may be inserted and   sedation may be given at the discretion of the physician.   7. A blood pressure cuff, EKG and other monitors will often be applied during the procedure.  Some patients may need to have extra oxygen administered for a short period. 8. You will be asked to provide medical information, including your allergies, prior to the procedure.  We must know immediately if you are taking blood thinners (like Coumadin/Warfarin)  Or if you are allergic to IV iodine contrast (dye). We must  know if you could possible be pregnant.  Possible side-effects:  Bleeding from needle site  Infection (rare, may require surgery)  Nerve injury (rare)  Numbness & tingling (temporary)  Difficulty urinating (rare, temporary)  Spinal headache ( a headache worse with upright posture)  Light -headedness (temporary)  Pain at injection site (several days)  Decreased blood pressure (temporary)  Weakness in arm/leg (temporary)  Pressure sensation in back/neck (temporary)  Call if you experience:  Fever/chills associated with headache or increased back/neck pain.  Headache worsened by an upright position.  New onset weakness or numbness of an extremity below the injection site  Hives or difficulty breathing (go to the emergency room)  Inflammation or drainage at the infection site  Severe back/neck pain  Any new symptoms which are concerning to you  Please note:  Although the local anesthetic injected can often make your back or neck feel good for several hours after the injection, the pain will likely return.  It takes 3-7 days for steroids to work in the epidural space.  You may not notice any pain relief for at least that one week.  If effective, we will often do a series of three injections spaced 3-6 weeks apart to maximally decrease your pain.  After the initial series, we generally will wait several months before considering a repeat injection of the same type.  If you have any questions, please call [phone] [facility] 

## 2018-07-08 NOTE — Progress Notes (Signed)
Upon getting up from procedure table patient states his feet and back of legs are numb. Dr. Andree Elk called to see patient.

## 2018-07-09 ENCOUNTER — Telehealth: Payer: Self-pay

## 2018-07-09 NOTE — Telephone Encounter (Signed)
Post procedure phone call.  Patient states he is doing well.  No further numbness in legs.

## 2018-07-17 ENCOUNTER — Encounter: Payer: Self-pay | Admitting: Anesthesiology

## 2018-07-17 NOTE — Progress Notes (Signed)
Subjective:  Patient ID: Thomas [name], male    DOB: 1932-11-10  Age: 85 y.o. MRN: [id number]  CC: Back Pain (lower)   Procedure: L5-S1 advancing to L4-5 epidural steroid under fluoroscopic guidance with no sedation  HPI Thomas [name] presents for reevaluation.  He presents with the same recurrent low back pain and posterior lateral leg pain that he is had in the past.  Thomas [name] receives periodic epidural steroids for these and generally gets 75 to 80% relief lasting a few months.  He goes back to his primary activities and has better pain relief.  Unfortunately nothing more conservative has worked for him.  He continues to use medication management sparingly and does his exercises routinely.  Despite this he gets breakthrough pain every 3 to 4 months on average and requires an epidural injection to suppress his low back pain.  These have worked very well for him and he desires to proceed with a repeat epidural today.  He denies change in lower extremity strength or function.  He has occasional give way weakness.  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  Outpatient Medications Prior to Visit  Medication Sig Dispense Refill  . allopurinol (ZYLOPRIM) 100 MG tablet Take 2 tablets (200 mg total) by mouth daily. 180 tablet 1  . amLODipine (NORVASC) 5 MG tablet Take 1 tablet (5 mg total) by mouth daily. 180 tablet 4  . apixaban (ELIQUIS) 2.5 MG TABS tablet Take 2.5 mg by mouth 2 (two) times daily.    Marland Kitchen atorvastatin (LIPITOR) 40 MG tablet TAKE 1 TABLET BY MOUTH EVERY DAY AT 6PM 90 tablet 2  . cefdinir (OMNICEF) 300 MG capsule Take 1 capsule (300 mg total) by mouth daily. 30 capsule 5  . Cholecalciferol (VITAMIN D3) 5000 units CAPS Take 5,000 Units by mouth daily.    . Coenzyme Q10 (COQ10) 100 MG CAPS Take 100 mg by mouth daily.    . colchicine (COLCRYS) 0.6 MG tablet Take 1 tablet (0.6 mg total) by mouth daily as needed (for gout). 30 tablet 5  . diclofenac sodium (VOLTAREN) 1 % GEL Apply  2 g topically daily as needed (for pain).     Marland Kitchen dofetilide (TIKOSYN) 125 MCG capsule Take 1 capsule (125 mcg total) by mouth 2 (two) times daily. 180 capsule 1  . dutasteride (AVODART) 0.5 MG capsule Take 0.5 mg by mouth daily as needed (for urination).     Marland Kitchen esomeprazole (NEXIUM) 40 MG capsule Take 40 mg by mouth daily as needed (for heartburn).     . hydrALAZINE (APRESOLINE) 25 MG tablet Take 1 tablet (25 mg total) by mouth 3 (three) times daily as needed. (Patient taking differently: Take 25 mg by mouth 3 (three) times daily as needed (for BP > 150). ) 90 tablet 6  . metroNIDAZOLE (FLAGYL) 500 MG tablet Take 1 tablet (500 mg total) by mouth See admin instructions. Take 500 mg by mouth twice daily on Sunday and Monday 30 tablet 11  . Multiple Vitamins-Minerals (CENTRUM SILVER 50+MEN PO) Take 1 tablet by mouth daily.    . nebivolol (BYSTOLIC) 5 MG tablet Take 1 tablet (5 mg total) by mouth daily. 90 tablet 3  . NON FORMULARY Testosterone gel compound - apply 1 cc per day    . propranolol (INDERAL) 40 MG tablet TAKE ONE (1) TABLET THREE (3) TIMES EACHDAY AS NEEDED (Patient taking differently: TAKE ONE (1) TABLET THREE (3) TIMES daily AS NEEDED for afib) 270 tablet 1  . silodosin (  RAPAFLO) 8 MG CAPS capsule Take 8 mg by mouth daily as needed (for urintation).     . temazepam (RESTORIL) 30 MG capsule temazepam 30 mg capsule    . terazosin (HYTRIN) 1 MG capsule Take 1 mg by mouth at bedtime.    . Testosterone 20.25 MG/ACT (1.62%) GEL Apply 1 pump to each shoulder daily 75 g 2  . testosterone cypionate (DEPOTESTOSTERONE CYPIONATE) 200 MG/ML injection Inject 1 mL (200 mg total) into the muscle every 28 (twenty-eight) days. 10 mL 5  . traMADol (ULTRAM) 50 MG tablet Take 50 mg by mouth every 6 (six) hours as needed for moderate pain.     . Turmeric 500 MG CAPS Take 500 mg by mouth daily.    . vitamin E 400 UNIT capsule Take 400 Units by mouth daily.    Marland Kitchen doxycycline (VIBRA-TABS) 100 MG tablet Take 1  tablet (100 mg total) by mouth daily. (Patient not taking: Reported on 07/08/2018) 30 tablet 5  . fluticasone furoate-vilanterol (BREO ELLIPTA) 100-25 MCG/INH AEPB Inhale 1 puff into the lungs daily. (Patient not taking: Reported on 05/14/2018) 1 each 5   No facility-administered medications prior to visit.     Review of Systems CNS: No confusion or sedation Cardiac: No angina or palpitations GI: No abdominal pain or constipation Constitutional: No nausea vomiting fevers or chills  Objective:  BP (!) 161/82   Pulse 70   Temp 97.6 F (36.4 C) (Oral)   Resp 18   Ht 5\' 7"  (1.702 m)   Wt 172 lb (78 kg)   SpO2 97%   BMI 26.94 kg/m    BP Readings from Last 3 Encounters:  07/08/18 (!) 161/82  05/14/18 120/80  05/11/18 (!) 142/64     Wt Readings from Last 3 Encounters:  07/08/18 172 lb (78 kg)  05/14/18 172 lb (78 kg)  03/17/18 169 lb 4 oz (76.8 kg)     Physical Exam Pt is alert and oriented PERRL EOMI HEART IS RRR no murmur or rub LCTA no wheezing or rales MUSCULOSKELETAL reveals some paraspinous muscle tenderness but no overt trigger points.  He walks with a mildly antalgic gait.  He has a positive straight leg raise on the left side  Labs  Lab Results  Component Value Date   HGBA1C 5.6 07/11/2016   HGBA1C 5.5 06/14/2016   Lab Results  Component Value Date   LDLCALC 74 02/24/2018   CREATININE 1.47 (H) 05/11/2018    -------------------------------------------------------------------------------------------------------------------- Lab Results  Component Value Date   WBC 7.1 05/11/2018   HGB 16.5 05/11/2018   HCT 47.9 05/11/2018   PLT 193 05/11/2018   GLUCOSE 93 05/11/2018   CHOL 141 02/24/2018   TRIG 39 02/24/2018   HDL 59 02/24/2018   LDLCALC 74 02/24/2018   ALT 18 02/24/2018   AST 23 02/24/2018   NA 137 05/11/2018   K 4.6 05/11/2018   CL 96 05/11/2018   CREATININE 1.47 (H) 05/11/2018   BUN 17 05/11/2018   CO2 26 05/11/2018   TSH 2.260 11/21/2017    PSA 5.8 08/22/2014   INR 1.06 06/25/2016   HGBA1C 5.6 07/11/2016    --------------------------------------------------------------------------------------------------------------------- Dg C-arm 1-60 Min-no Report  Result Date: 07/08/2018 Fluoroscopy was utilized by the requesting physician.  No radiographic interpretation.     Assessment & Plan:   Thomas [name] was seen today for back pain.  Diagnoses and all orders for this visit:  DDD (degenerative disc disease), lumbar  Spinal stenosis of lumbar region without neurogenic claudication  Bilateral sciatica  Sciatica, right side  Other orders -     triamcinolone acetonide (KENALOG-40) injection 40 mg -     sodium chloride flush (NS) 0.9 % injection 10 mL -     ropivacaine (PF) 2 mg/mL (0.2%) (NAROPIN) injection 10 mL -     lidocaine (PF) (XYLOCAINE) 1 % injection 5 mL -     iopamidol (ISOVUE-M) 41 % intrathecal injection 20 mL        ----------------------------------------------------------------------------------------------------------------------  Problem List Items Addressed This Visit    None    Visit Diagnoses    DDD (degenerative disc disease), lumbar    -  Primary   Relevant Medications   triamcinolone acetonide (KENALOG-40) injection 40 mg (Completed)   Spinal stenosis of lumbar region without neurogenic claudication       Bilateral sciatica       Sciatica, right side            ----------------------------------------------------------------------------------------------------------------------  1. DDD (degenerative disc disease), lumbar We will proceed with an epidural steroid injection today.  The risks and benefits of been once again reviewed all questions answered.  We will have him return to clinic approximately 3 months for reevaluation and on a as needed basis.  2. Spinal stenosis of lumbar region without neurogenic claudication As above and continue core stretching strengthening exercises.  3.  Bilateral sciatica As above  4. Sciatica, right side As above    ----------------------------------------------------------------------------------------------------------------------  I am having Thomas [name] maintain his esomeprazole, Voltaren, hydrALAZINE, terazosin, traMADol, propranolol, dutasteride, silodosin, fluticasone furoate-vilanterol, apixaban, amLODipine, atorvastatin, testosterone cypionate, Multiple Vitamins-Minerals (CENTRUM SILVER 50+MEN PO), CoQ10, vitamin E, Vitamin D3, Turmeric, metroNIDAZOLE, NON FORMULARY, temazepam, colchicine, allopurinol, dofetilide, nebivolol, Testosterone, doxycycline, and cefdinir. We administered triamcinolone acetonide, sodium chloride flush, ropivacaine (PF) 2 mg/mL (0.2%), lidocaine (PF), and iopamidol.   Meds ordered this encounter  Medications  . triamcinolone acetonide (KENALOG-40) injection 40 mg  . sodium chloride flush (NS) 0.9 % injection 10 mL  . ropivacaine (PF) 2 mg/mL (0.2%) (NAROPIN) injection 10 mL  . lidocaine (PF) (XYLOCAINE) 1 % injection 5 mL  . iopamidol (ISOVUE-M) 41 % intrathecal injection 20 mL   Patient's Medications  New Prescriptions   No medications on file  Previous Medications   ALLOPURINOL (ZYLOPRIM) 100 MG TABLET    Take 2 tablets (200 mg total) by mouth daily.   AMLODIPINE (NORVASC) 5 MG TABLET    Take 1 tablet (5 mg total) by mouth daily.   APIXABAN (ELIQUIS) 2.5 MG TABS TABLET    Take 2.5 mg by mouth 2 (two) times daily.   ATORVASTATIN (LIPITOR) 40 MG TABLET    TAKE 1 TABLET BY MOUTH EVERY DAY AT 6PM   CEFDINIR (OMNICEF) 300 MG CAPSULE    Take 1 capsule (300 mg total) by mouth daily.   CHOLECALCIFEROL (VITAMIN D3) 5000 UNITS CAPS    Take 5,000 Units by mouth daily.   COENZYME Q10 (COQ10) 100 MG CAPS    Take 100 mg by mouth daily.   COLCHICINE (COLCRYS) 0.6 MG TABLET    Take 1 tablet (0.6 mg total) by mouth daily as needed (for gout).   DICLOFENAC SODIUM (VOLTAREN) 1 % GEL    Apply 2 g topically daily  as needed (for pain).    DOFETILIDE (TIKOSYN) 125 MCG CAPSULE    Take 1 capsule (125 mcg total) by mouth 2 (two) times daily.   DOXYCYCLINE (VIBRA-TABS) 100 MG TABLET    Take 1 tablet (100 mg total) by  mouth daily.   DUTASTERIDE (AVODART) 0.5 MG CAPSULE    Take 0.5 mg by mouth daily as needed (for urination).    ESOMEPRAZOLE (NEXIUM) 40 MG CAPSULE    Take 40 mg by mouth daily as needed (for heartburn).    FLUTICASONE FUROATE-VILANTEROL (BREO ELLIPTA) 100-25 MCG/INH AEPB    Inhale 1 puff into the lungs daily.   HYDRALAZINE (APRESOLINE) 25 MG TABLET    Take 1 tablet (25 mg total) by mouth 3 (three) times daily as needed.   METRONIDAZOLE (FLAGYL) 500 MG TABLET    Take 1 tablet (500 mg total) by mouth See admin instructions. Take 500 mg by mouth twice daily on Sunday and Monday   MULTIPLE VITAMINS-MINERALS (CENTRUM SILVER 50+MEN PO)    Take 1 tablet by mouth daily.   NEBIVOLOL (BYSTOLIC) 5 MG TABLET    Take 1 tablet (5 mg total) by mouth daily.   NON FORMULARY    Testosterone gel compound - apply 1 cc per day   PROPRANOLOL (INDERAL) 40 MG TABLET    TAKE ONE (1) TABLET THREE (3) TIMES EACHDAY AS NEEDED   SILODOSIN (RAPAFLO) 8 MG CAPS CAPSULE    Take 8 mg by mouth daily as needed (for urintation).    TEMAZEPAM (RESTORIL) 30 MG CAPSULE    temazepam 30 mg capsule   TERAZOSIN (HYTRIN) 1 MG CAPSULE    Take 1 mg by mouth at bedtime.   TESTOSTERONE 20.25 MG/ACT (1.62%) GEL    Apply 1 pump to each shoulder daily   TESTOSTERONE CYPIONATE (DEPOTESTOSTERONE CYPIONATE) 200 MG/ML INJECTION    Inject 1 mL (200 mg total) into the muscle every 28 (twenty-eight) days.   TRAMADOL (ULTRAM) 50 MG TABLET    Take 50 mg by mouth every 6 (six) hours as needed for moderate pain.    TURMERIC 500 MG CAPS    Take 500 mg by mouth daily.   VITAMIN E 400 UNIT CAPSULE    Take 400 Units by mouth daily.  Modified Medications   No medications on file  Discontinued Medications   No medications on file    ----------------------------------------------------------------------------------------------------------------------  Follow-up: Return if symptoms worsen or fail to improve, for procedure.   Procedure: L5-S1 advancing to L4-5 LESI with fluoroscopic guidance and no moderate sedation  NOTE: The risks, benefits, and expectations of the procedure have been discussed and explained to the patient who was understanding and in agreement with suggested treatment plan. No guarantees were made.  DESCRIPTION OF PROCEDURE: Lumbar epidural steroid injection with no IV Versed, EKG, blood pressure, pulse, and pulse oximetry monitoring. The procedure was performed with the patient in the prone position under fluoroscopic guidance.  Sterile prep x3 was initiated and I then injected subcutaneous lidocaine to the overlying L5-S1 site after its fluoroscopic identifictation.  Using strict aseptic technique, I then advanced an 18-gauge Tuohy epidural needle in the midline using interlaminar approach and there was limited loss-of-resistance to saline technique with equivocal needle positioning.  This was done at L5-S1.  There was negative aspiration for heme but questionable flow of CSF.  I then confirmed position with both AP and Lateral fluoroscan.  Based on this I have been changed needle placement to left lateral L4-5 placement.  This was easily achieved once again with loss-of-resistance to saline after subcutaneous lidocaine to the skin.  Once again done aseptic technique.  2 cc of Isovue were injected and a  total of 5 mL of Preservative-Free normal saline mixed with 40 mg of Kenalog  and 1cc Ropicaine 0.2 percent were injected incrementally via the  epidurally placed needle. The needle was removed. The patient tolerated the injection well and was convalesced and discharged to home in stable condition but did have evidence of some transmigration of local anesthetic most likely from the L5-S1 dural puncture.  This  manifested with some lower extremity weakness primarily in the quadriceps.  He was discharged to the care of his wife in a wheelchair with instructions on managing home care.  He has been instructed on how to contact us for assistance.  I did contact Blair later that evening and he had complete resolution of the associated numbness from the epidural injection.   Molli Barrows, MD

## 2018-07-20 ENCOUNTER — Telehealth: Payer: Self-pay | Admitting: Family Medicine

## 2018-07-20 NOTE — Telephone Encounter (Signed)
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   He has never had this but wants to "try it as it is on the list of meds for lymes disease".

## 2018-07-20 NOTE — Telephone Encounter (Signed)
Please review

## 2018-07-27 NOTE — Telephone Encounter (Signed)
Advised 

## 2018-07-27 NOTE — Telephone Encounter (Signed)
I am not sure this medicine will be available at all due to coronavirus issues.  I really doubt it will be.

## 2018-08-03 ENCOUNTER — Ambulatory Visit (INDEPENDENT_AMBULATORY_CARE_PROVIDER_SITE_OTHER): Payer: Medicare Other | Admitting: *Deleted

## 2018-08-03 ENCOUNTER — Other Ambulatory Visit: Payer: Self-pay

## 2018-08-03 DIAGNOSIS — I442 Atrioventricular block, complete: Secondary | ICD-10-CM | POA: Diagnosis not present

## 2018-08-03 LAB — CUP PACEART REMOTE DEVICE CHECK
Battery Voltage: 3 V
Brady Statistic AP VP Percent: 92.4 %
Brady Statistic AP VS Percent: 0.18 %
Brady Statistic AS VP Percent: 7.2 %
Brady Statistic AS VS Percent: 0.22 %
Brady Statistic RA Percent Paced: 90.34 %
Brady Statistic RV Percent Paced: 99.6 %
Date Time Interrogation Session: 20200330043011
Implantable Lead Implant Date: 20180214
Implantable Lead Implant Date: 20180214
Implantable Lead Location: 753859
Implantable Lead Location: 753860
Implantable Lead Model: 5076
Implantable Pulse Generator Implant Date: 20180214
Lead Channel Impedance Value: 342 Ohm
Lead Channel Impedance Value: 342 Ohm
Lead Channel Impedance Value: 380 Ohm
Lead Channel Impedance Value: 418 Ohm
Lead Channel Pacing Threshold Amplitude: 0.5 V
Lead Channel Pacing Threshold Amplitude: 1 V
Lead Channel Pacing Threshold Pulse Width: 0.4 ms
Lead Channel Pacing Threshold Pulse Width: 0.4 ms
Lead Channel Sensing Intrinsic Amplitude: 14.375 mV
Lead Channel Sensing Intrinsic Amplitude: 14.375 mV
Lead Channel Setting Pacing Amplitude: 2 V
Lead Channel Setting Pacing Amplitude: 2.5 V
Lead Channel Setting Pacing Pulse Width: 0.4 ms
Lead Channel Setting Sensing Sensitivity: 0.9 mV
MDC IDC MSMT BATTERY REMAINING LONGEVITY: 107 mo
MDC IDC MSMT LEADCHNL RA SENSING INTR AMPL: 2.625 mV
MDC IDC MSMT LEADCHNL RA SENSING INTR AMPL: 2.625 mV

## 2018-08-11 NOTE — Progress Notes (Signed)
Remote pacemaker transmission.   

## 2018-08-17 ENCOUNTER — Other Ambulatory Visit: Payer: Self-pay | Admitting: Internal Medicine

## 2018-08-19 ENCOUNTER — Other Ambulatory Visit: Payer: Self-pay | Admitting: *Deleted

## 2018-08-19 DIAGNOSIS — E291 Testicular hypofunction: Secondary | ICD-10-CM

## 2018-08-20 MED ORDER — TESTOSTERONE 20.25 MG/ACT (1.62%) TD GEL: TRANSDERMAL | 2 refills | Status: DC

## 2018-10-05 DIAGNOSIS — L578 Other skin changes due to chronic exposure to nonionizing radiation: Secondary | ICD-10-CM | POA: Diagnosis not present

## 2018-10-05 DIAGNOSIS — L570 Actinic keratosis: Secondary | ICD-10-CM | POA: Diagnosis not present

## 2018-10-05 DIAGNOSIS — B351 Tinea unguium: Secondary | ICD-10-CM | POA: Diagnosis not present

## 2018-10-05 DIAGNOSIS — Z1283 Encounter for screening for malignant neoplasm of skin: Secondary | ICD-10-CM | POA: Diagnosis not present

## 2018-10-05 DIAGNOSIS — Z85828 Personal history of other malignant neoplasm of skin: Secondary | ICD-10-CM | POA: Diagnosis not present

## 2018-10-13 ENCOUNTER — Telehealth: Payer: Self-pay | Admitting: Family Medicine

## 2018-10-13 MED ORDER — APIXABAN 2.5 MG PO TABS: 2.5000 mg | ORAL_TABLET | Freq: Two times a day (BID) | ORAL | 0 refills | Status: DC

## 2018-10-13 NOTE — Telephone Encounter (Signed)
Medication was sent into [facility] in [place], [place].

## 2018-10-13 NOTE — Telephone Encounter (Signed)
Pt's wife called saying they were at the beach and would like Dr Rosanna Randy to call in an order for the Eliquis 2.5 mg.  He left his rx at home and needs enough to get him through the week.[facility]  [place]  #[phone]  CB#   [phone]  Thanks Con Memos

## 2018-10-13 NOTE — Telephone Encounter (Signed)
Ok thanks 

## 2018-10-13 NOTE — Telephone Encounter (Signed)
Please advise 

## 2018-11-02 ENCOUNTER — Ambulatory Visit (INDEPENDENT_AMBULATORY_CARE_PROVIDER_SITE_OTHER): Payer: Medicare Other | Admitting: *Deleted

## 2018-11-02 DIAGNOSIS — I480 Paroxysmal atrial fibrillation: Secondary | ICD-10-CM

## 2018-11-02 DIAGNOSIS — I442 Atrioventricular block, complete: Secondary | ICD-10-CM | POA: Diagnosis not present

## 2018-11-03 LAB — CUP PACEART REMOTE DEVICE CHECK
Battery Remaining Longevity: 101 mo
Battery Voltage: 2.99 V
Brady Statistic AP VP Percent: 88.7 %
Brady Statistic AP VS Percent: 0.01 %
Brady Statistic AS VP Percent: 11.27 %
Brady Statistic AS VS Percent: 0.01 %
Brady Statistic RA Percent Paced: 83.39 %
Brady Statistic RV Percent Paced: 99.97 %
Date Time Interrogation Session: 20200629060407
Implantable Lead Implant Date: 20180214
Implantable Lead Implant Date: 20180214
Implantable Lead Location: 753859
Implantable Lead Location: 753860
Implantable Lead Model: 5076
Implantable Lead Model: 5076
Implantable Pulse Generator Implant Date: 20180214
Lead Channel Impedance Value: 323 Ohm
Lead Channel Impedance Value: 323 Ohm
Lead Channel Impedance Value: 380 Ohm
Lead Channel Impedance Value: 399 Ohm
Lead Channel Pacing Threshold Amplitude: 0.5 V
Lead Channel Pacing Threshold Amplitude: 0.875 V
Lead Channel Pacing Threshold Pulse Width: 0.4 ms
Lead Channel Pacing Threshold Pulse Width: 0.4 ms
Lead Channel Sensing Intrinsic Amplitude: 13 mV
Lead Channel Sensing Intrinsic Amplitude: 13 mV
Lead Channel Sensing Intrinsic Amplitude: 2.125 mV
Lead Channel Sensing Intrinsic Amplitude: 2.125 mV
Lead Channel Setting Pacing Amplitude: 2 V
Lead Channel Setting Pacing Amplitude: 2.5 V
Lead Channel Setting Pacing Pulse Width: 0.4 ms
Lead Channel Setting Sensing Sensitivity: 0.9 mV

## 2018-11-04 ENCOUNTER — Other Ambulatory Visit: Payer: Self-pay | Admitting: Family Medicine

## 2018-11-11 NOTE — Progress Notes (Signed)
Remote pacemaker transmission.   

## 2018-11-18 IMAGING — DX DG CHEST 1V PORT
2 series · 2 of 2 positions shown · non-contrast
Comparison: 09/10/2017 CT, 07/08/2016 chest radiograph and prior
studies

CLINICAL DATA: Pneumonia, cough, congestion and wheezing.

EXAM:
PORTABLE CHEST 1 VIEW

[chest ap (1 of 2)]
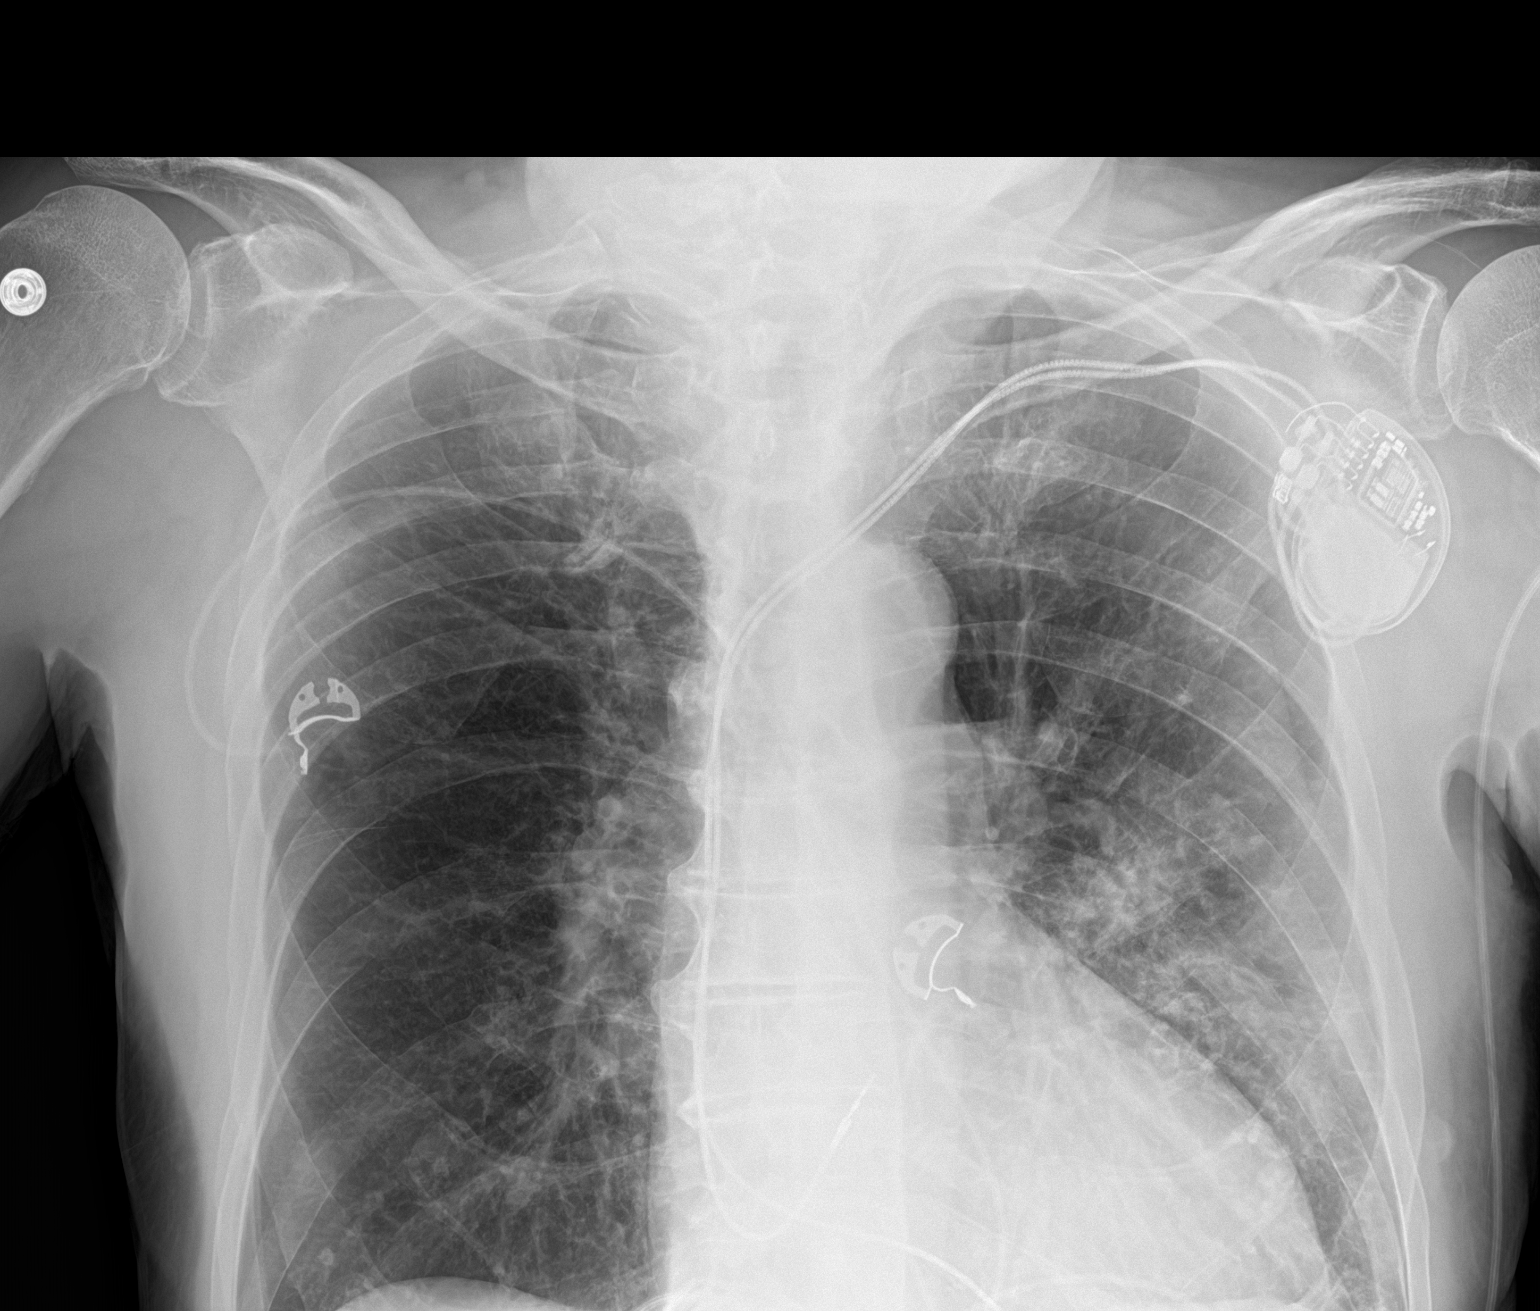

[chest ap (2 of 2)]
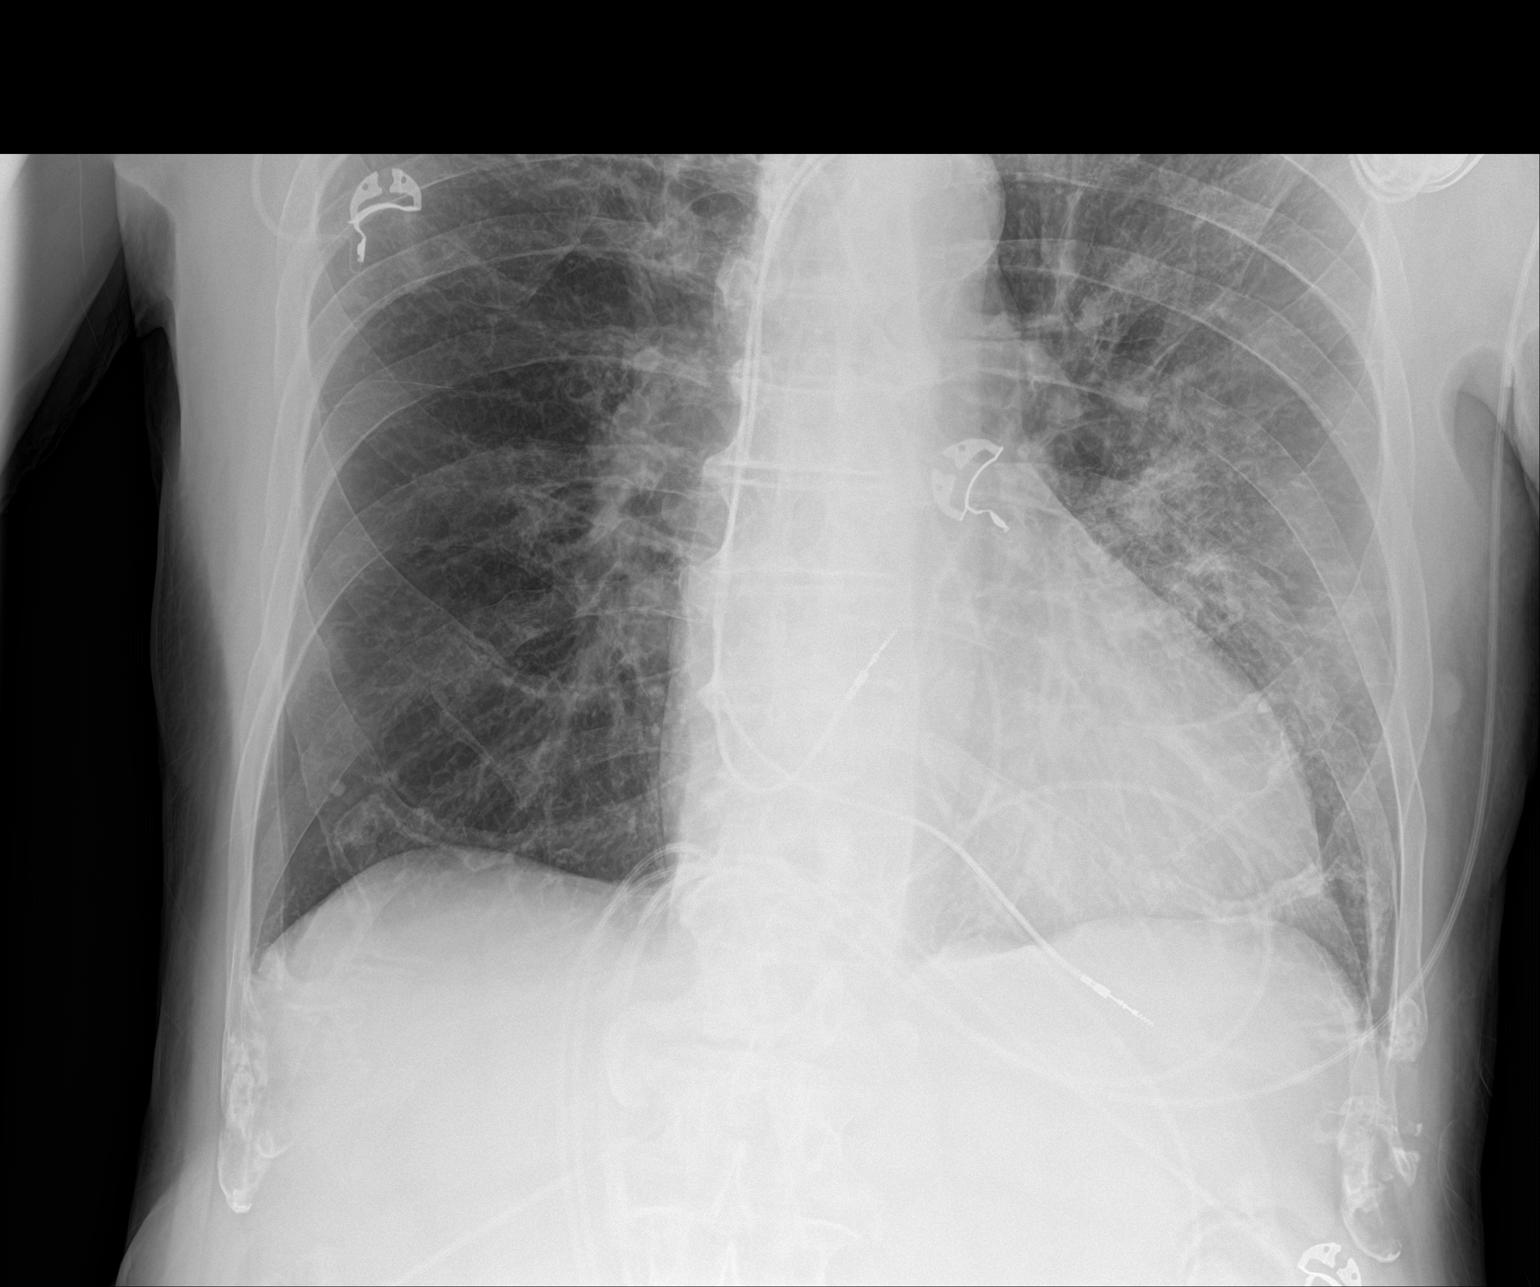

[2 of 2 positions shown; findings below may reference images not displayed]

FINDINGS: UPPER limits normal heart size and LEFT-sided pacemaker again noted.

Airspace opacities within the mid and lower LEFT lung do not appear
significantly changed from recent CT.

No pleural effusion or pneumothorax.

Little interval change from prior CT identified.
IMPRESSION: LEFT lung airspace opacities/pneumonia again identified. No
significant changes.

## 2018-11-30 ENCOUNTER — Ambulatory Visit: Payer: Self-pay

## 2019-01-05 ENCOUNTER — Other Ambulatory Visit: Payer: Self-pay | Admitting: Cardiovascular Disease

## 2019-01-05 ENCOUNTER — Other Ambulatory Visit: Payer: Self-pay | Admitting: Family Medicine

## 2019-01-10 NOTE — Progress Notes (Signed)
Patient Care Team: Jerrol Banana., MD as PCP - General (Family Medicine) Rockey Situ Kathlene November, MD as Consulting Physician (Cardiology) Deboraha Sprang, MD as Consulting Physician (Cardiology) Leandrew Koyanagi, MD as Referring Physician (Ophthalmology) Serena Croissant, MD as Consulting Physician (Family Medicine) Wellington Hampshire, MD as Consulting Physician (Cardiology)   HPI  Thomas [name] is a 85 y.o. male Seen in follow-up for pacemaker implanted 2/18 for complete heart block. He also has paroxysmal atrial flutter  on anticoagulation his CHADS-VASc score is greater than or equal to 6 with a prior TIA.  Echocardiogram 2/18 EF 55-60% with normal right-sided function -this represented interval normalization  DATE TEST EF   2/18 Echo   55-60 %   6/19 LHC   3 V CAD moderate>>medical Rx           With worsening shortness of breath he was admitted for Tikosyn.  This was successful in restoring sinus rhythm.  Pre-evaluation for coronary disease included a CT scan which demonstrated pneumonia.  Followup CXR 7/19 full resolution   He has been holding sinus rhythm.  Exercise tolerance remains overall much improved.  When he runs however he feels like he just cannot improve his conditioning.  See below.  On Anticoagulation;  No bleeding issues   No interval awareness of atrial fibrillation.   No chest pain or edema.   Date Cr K Mg Hgb  6/18 1.46   14.4  5/19 1.47 4.3 2.0 16  10/19 1.55 4.5  16.8  1/20 1.47 4.6  16.5       Past Medical History:  Diagnosis Date  . Aortic valve disorders   . Arthritis   . Atrial flutter ([place]) 06/18/2016   "AF or AFl; not sure which" (06/23/2016)  . Basal cell carcinoma    "face, nose left shoulder, left arm" (06/19/2016)  . BBB (bundle branch block)    hx right  . Chronic back pain    "neck, thoracic, lower back" (06/19/2016)  . Complete heart block ([place]) 06/2016  . Dyspnea   . GERD (gastroesophageal reflux disease)    . Heart block    "I've had type I, II [place] before now" (06/19/2016)  . History of gout   . History of hiatal hernia    "self dx'd" (06/19/2016)  . Hyperlipidemia   . Hypertension   . Lyme disease    "dx'd by me 2003; cx's showed dx 08/2015"  . Migraine    "3-4/year" (06/19/2016)  . Presence of permanent cardiac pacemaker 06/19/2016  . PVC's (premature ventricular contractions)   . Renal cancer, left ([place]) 2006   S/P cryotherapy  . Spinal stenosis    "cervical, 1 thoracic, lumbar" (06/19/2016)  . Squamous carcinoma    "face, nose left shoulder, left arm" (06/19/2016)  . Stroke ([place])   . TIA (transient ischemic attack) 06/14/2016   "I'm not sure that's what it was" (06/25/2016)  . Visit for monitoring Tikosyn therapy 09/09/2017    Past Surgical History:  Procedure Laterality Date  . ANKLE FRACTURE SURGERY Right 1967  . BASAL CELL CARCINOMA EXCISION     "face, nose left shoulder, left arm"  . BIOPSY PROSTATE  2001 & 2003  . CARDIAC CATHETERIZATION  1990's  . CARDIOVERSION N/A 09/11/2017   Procedure: CARDIOVERSION;  Surgeon: Lelon Perla, MD;  Location: [facility] ENDOSCOPY;  Service: Cardiovascular;  Laterality: N/A;  . FRACTURE SURGERY    . INGUINAL HERNIA REPAIR Left 2012  .  INSERT / REPLACE / REMOVE PACEMAKER  06/19/2016  . LAPAROSCOPIC ABLATION RENAL MASS    . LEFT HEART CATH AND CORONARY ANGIOGRAPHY Left 10/23/2017   Procedure: LEFT HEART CATH AND CORONARY ANGIOGRAPHY;  Surgeon: Wellington Hampshire, MD;  Location: [place] CV LAB;  Service: Cardiovascular;  Laterality: Left;  . PACEMAKER IMPLANT N/A 06/19/2016   Procedure: Pacemaker Implant;  Surgeon: Deboraha Sprang, MD;  Location: [place] CV LAB;  Service: Cardiovascular;  Laterality: N/A;  . pacemasker    . PROSTATE SURGERY    . SQUAMOUS CELL CARCINOMA EXCISION     "face, nose left shoulder, left arm"  . TONSILLECTOMY AND ADENOIDECTOMY      Current Outpatient Medications  Medication Sig Dispense Refill  . allopurinol  (ZYLOPRIM) 100 MG tablet Take 2 tablets (200 mg total) by mouth daily. (Patient taking differently: Take 100 mg by mouth daily. ) 180 tablet 1  . amLODipine (NORVASC) 5 MG tablet TAKE 1 TABLET BY MOUTH DAILY 180 tablet 4  . apixaban (ELIQUIS) 2.5 MG TABS tablet Take 1 tablet (2.5 mg total) by mouth 2 (two) times daily. 60 tablet 0  . atorvastatin (LIPITOR) 40 MG tablet Take 1 tablet (40 mg total) by mouth daily at 6 PM. Please call to schedule appointment. 30 tablet 0  . cefdinir (OMNICEF) 300 MG capsule Take 1 capsule (300 mg total) by mouth daily. 30 capsule 5  . Cholecalciferol (VITAMIN D3) 5000 units CAPS Take 5,000 Units by mouth daily.    . Coenzyme Q10 (COQ10) 100 MG CAPS Take 100 mg by mouth daily.    . colchicine (COLCRYS) 0.6 MG tablet Take 1 tablet (0.6 mg total) by mouth daily as needed (for gout). 30 tablet 5  . diclofenac sodium (VOLTAREN) 1 % GEL Apply 2 g topically daily as needed (for pain).     Marland Kitchen dofetilide (TIKOSYN) 125 MCG capsule TAKE 1 CAPSULE BY MOUTH TWICE A DAY 180 capsule 1  . dutasteride (AVODART) 0.5 MG capsule Take 0.5 mg by mouth daily as needed (for urination).     Marland Kitchen esomeprazole (NEXIUM) 40 MG capsule Take 40 mg by mouth daily as needed (for heartburn).     . hydrALAZINE (APRESOLINE) 25 MG tablet Take 1 tablet (25 mg total) by mouth 3 (three) times daily as needed. (Patient taking differently: Take 25 mg by mouth 3 (three) times daily as needed (for BP > 150). ) 90 tablet 6  . metroNIDAZOLE (FLAGYL) 500 MG tablet TAKE 1 TABLET BY MOUTH TWICE DAILY ON SUNDAY AND MONDAY 30 tablet 11  . Multiple Vitamins-Minerals (CENTRUM SILVER 50+MEN PO) Take 1 tablet by mouth daily.    . nebivolol (BYSTOLIC) 5 MG tablet Take 1 tablet (5 mg total) by mouth daily. 90 tablet 3  . NON FORMULARY Testosterone gel compound - apply 1 cc per day    . propranolol (INDERAL) 40 MG tablet TAKE ONE (1) TABLET THREE (3) TIMES EACHDAY AS NEEDED (Patient taking differently: TAKE ONE (1) TABLET THREE  (3) TIMES daily AS NEEDED for afib) 270 tablet 1  . silodosin (RAPAFLO) 8 MG CAPS capsule Take 8 mg by mouth daily as needed (for urintation).     . temazepam (RESTORIL) 30 MG capsule temazepam 30 mg capsule    . terazosin (HYTRIN) 1 MG capsule Take 1 mg by mouth at bedtime.    . Testosterone 20.25 MG/ACT (1.62%) GEL Apply 1 pump to each shoulder daily 75 g 2  . testosterone cypionate (DEPOTESTOSTERONE CYPIONATE) 200 MG/ML  injection Inject 1 mL (200 mg total) into the muscle every 28 (twenty-eight) days. 10 mL 5  . traMADol (ULTRAM) 50 MG tablet Take 50 mg by mouth every 6 (six) hours as needed for moderate pain.     . Turmeric 500 MG CAPS Take 500 mg by mouth daily.    . vitamin E 400 UNIT capsule Take 400 Units by mouth daily.    Marland Kitchen doxycycline (VIBRA-TABS) 100 MG tablet Take 1 tablet (100 mg total) by mouth daily. (Patient not taking: Reported on 07/08/2018) 30 tablet 5   No current facility-administered medications for this visit.     Allergies  Allergen Reactions  . Iodine Hives and Other (See Comments)    IVP contrast  . Penicillins Itching, Rash and Other (See Comments)    ITCHY FEELING IN FINGERS Has patient had a PCN reaction causing immediate rash, facial/tongue/throat swelling, SOB or lightheadedness with hypotension: No Has patient had a PCN reaction causing severe rash involving mucus membranes or skin necrosis: No Has patient had a PCN reaction that required hospitalization No Has patient had a PCN reaction occurring within the last 10 years: No If all of the above answers are "NO", then may proceed with Cephalosporin use.       Review of Systems negative except from HPI and PMH  Physical Exam BP 138/78 (BP Location: Left Arm, Patient Position: Sitting, Cuff Size: Normal)   Pulse 70   Ht 5\' 7"  (1.702 m)   Wt 163 lb 4 oz (74 kg)   BMI 25.57 kg/m  Well developed and well nourished in no acute distress HENT normal Neck supple with JVP-flat Clear Device pocket well  healed; without hematoma or erythema.  There is no tethering  Regular rate and rhythm, no  gallop No murmur Abd-soft with active BS No Clubbing cyanosis  edema Skin-warm and dry A & Oriented  Grossly normal sensory and motor function      Assessment and Plan:  Atrial fibrillation-persistent  TIA  Coronary artery disease- mod medical therapy  Sinus node dysfunction/chronotropic incompetence  Complete heart block    Hypertension  High Risk Medication Surveillance-Dofetilide  Pacemaker Medtronic   Renal insufficiency grade 3  Sleep disordered breathing >>sleep study negative  Nightmares   Significant decrease in atrial fibrillation burden.  No clinical bleeding.  Surveillance laboratories needed for dofetilide   Long discussion regarding heart rate excursion.  He is asked that we decrease his pacemaker rate from 70--60.  We then reviewed the very flat histograms.  About 10% of his beats are faster than 70-80.  I suspect that when he runs he will find that his heart rate is quite limited.  At that point we can activate rate response.  He will let us know.  Without symptoms of ischemia  We spent more than 50% of our >25 min visit in face to face counseling regarding the above

## 2019-01-12 ENCOUNTER — Other Ambulatory Visit: Payer: Self-pay

## 2019-01-12 ENCOUNTER — Encounter: Payer: Self-pay | Admitting: Internal Medicine

## 2019-01-12 ENCOUNTER — Ambulatory Visit (INDEPENDENT_AMBULATORY_CARE_PROVIDER_SITE_OTHER): Payer: Medicare Other | Admitting: Internal Medicine

## 2019-01-12 VITALS — BP 138/78 | HR 70 | Ht 67.0 in | Wt 163.2 lb

## 2019-01-12 DIAGNOSIS — Z950 Presence of cardiac pacemaker: Secondary | ICD-10-CM

## 2019-01-12 DIAGNOSIS — Z79899 Other long term (current) drug therapy: Secondary | ICD-10-CM | POA: Diagnosis not present

## 2019-01-12 DIAGNOSIS — I442 Atrioventricular block, complete: Secondary | ICD-10-CM

## 2019-01-12 DIAGNOSIS — I4819 Other persistent atrial fibrillation: Secondary | ICD-10-CM

## 2019-01-12 NOTE — Patient Instructions (Addendum)
Medication Instructions:  - Your physician recommends that you continue on your current medications as directed. Please refer to the Current Medication list given to you today.  If you need a refill on your cardiac medications before your next appointment, please call your pharmacy.   Lab work: - Your physician recommends that you have lab work today: BMP/ CBC/ Magnesium  If you have labs (blood work) drawn today and your tests are completely normal, you will receive your results only by: Marland Kitchen MyChart Message (if you have MyChart) OR . A paper copy in the mail If you have any lab test that is abnormal or we need to change your treatment, we will call you to review the results.  Testing/Procedures: - none ordered  Follow-Up: At [facility], you and your health needs are our priority.  As part of our continuing mission to provide you with exceptional heart care, we have created designated Provider Care Teams.  These Care Teams include your primary Cardiologist (physician) and Advanced Practice Providers (APPs -  Physician Assistants and Nurse Practitioners) who all work together to provide you with the care you need, when you need it.  You will need a follow up appointment in 6 months (March 2021) with Dr. Caryl Comes.   Marland Kitchen Please call our office 2 months in advance to schedule this appointment.  (Call in early January to schedule)   - Remote monitoring is used to monitor your Pacemaker of ICD from home. This monitoring reduces the number of office visits required to check your device to one time per year. It allows [place] to keep an eye on the functioning of your device to ensure it is working properly. You are scheduled for a device check from home on 02/02/19. You may send your transmission at any time that day. If you have a wireless device, the transmission will be sent automatically. After your physician reviews your transmission, you will receive a postcard with your next transmission date.    Any  Other Special Instructions Will Be Listed Below (If Applicable). - N/A

## 2019-01-13 ENCOUNTER — Telehealth: Payer: Self-pay

## 2019-01-13 LAB — CBC WITH DIFFERENTIAL/PLATELET
Basophils Absolute: 0.1 10*3/uL (ref 0.0–0.2)
Basos: 1 %
EOS (ABSOLUTE): 0.2 10*3/uL (ref 0.0–0.4)
Eos: 3 %
Hematocrit: 48.8 % (ref 37.5–51.0)
Hemoglobin: 16 g/dL (ref 13.0–17.7)
Immature Grans (Abs): 0 10*3/uL (ref 0.0–0.1)
Immature Granulocytes: 0 %
Lymphocytes Absolute: 2.2 10*3/uL (ref 0.7–3.1)
Lymphs: 35 %
MCH: 29.3 pg (ref 26.6–33.0)
MCHC: 32.8 g/dL (ref 31.5–35.7)
MCV: 89 fL (ref 79–97)
Monocytes Absolute: 0.9 10*3/uL (ref 0.1–0.9)
Monocytes: 15 %
Neutrophils Absolute: 2.9 10*3/uL (ref 1.4–7.0)
Neutrophils: 46 %
Platelets: 181 10*3/uL (ref 150–450)
RBC: 5.46 x10E6/uL (ref 4.14–5.80)
RDW: 13.3 % (ref 11.6–15.4)
WBC: 6.1 10*3/uL (ref 3.4–10.8)

## 2019-01-13 LAB — BASIC METABOLIC PANEL
BUN/Creatinine Ratio: 13 (ref 10–24)
BUN: 21 mg/dL (ref 8–27)
CO2: 26 mmol/L (ref 20–29)
Calcium: 9.6 mg/dL (ref 8.6–10.2)
Chloride: 97 mmol/L (ref 96–106)
Creatinine, Ser: 1.64 mg/dL — ABNORMAL HIGH (ref 0.76–1.27)
GFR calc Af Amer: 44 mL/min/{1.73_m2} — ABNORMAL LOW (ref >59)
GFR calc non Af Amer: 38 mL/min/{1.73_m2} — ABNORMAL LOW (ref >59)
Glucose: 98 mg/dL (ref 65–99)
Potassium: 4.7 mmol/L (ref 3.5–5.2)
Sodium: 137 mmol/L (ref 134–144)

## 2019-01-13 LAB — MAGNESIUM: Magnesium: 2.2 mg/dL (ref 1.6–2.3)

## 2019-01-13 NOTE — Telephone Encounter (Signed)
L MOM to schedule echocardigram

## 2019-01-13 NOTE — Telephone Encounter (Signed)
-----   Message from Emily Filbert, RN sent at 01/13/2019 12:36 PM EDT ----- Not sure if Dr. Madelin Headings forgot to stop by check out after his labs yesterday. But he needs to have an echo scheduled.  Can someone reach out to him to set this up please!

## 2019-01-14 ENCOUNTER — Other Ambulatory Visit: Payer: Self-pay | Admitting: Anesthesiology

## 2019-01-14 ENCOUNTER — Encounter: Payer: Self-pay | Admitting: Anesthesiology

## 2019-01-14 ENCOUNTER — Other Ambulatory Visit: Payer: Self-pay

## 2019-01-14 ENCOUNTER — Ambulatory Visit: Payer: Medicare Other | Attending: Anesthesiology | Admitting: Anesthesiology

## 2019-01-14 ENCOUNTER — Ambulatory Visit
Admission: RE | Admit: 2019-01-14 | Discharge: 2019-01-14 | Disposition: A | Discharge status: Home / Self Care | Payer: Medicare Other | Source: Ambulatory Visit | Attending: Anesthesiology | Admitting: Anesthesiology

## 2019-01-14 VITALS — BP 169/86 | HR 70 | Temp 98.0°F | Resp 18 | Ht 67.0 in | Wt 160.0 lb

## 2019-01-14 DIAGNOSIS — M5431 Sciatica, right side: Secondary | ICD-10-CM | POA: Diagnosis not present

## 2019-01-14 DIAGNOSIS — R52 Pain, unspecified: Secondary | ICD-10-CM

## 2019-01-14 DIAGNOSIS — M5136 Other intervertebral disc degeneration, lumbar region: Secondary | ICD-10-CM

## 2019-01-14 DIAGNOSIS — M48061 Spinal stenosis, lumbar region without neurogenic claudication: Secondary | ICD-10-CM | POA: Diagnosis not present

## 2019-01-14 DIAGNOSIS — M51369 Other intervertebral disc degeneration, lumbar region without mention of lumbar back pain or lower extremity pain: Secondary | ICD-10-CM

## 2019-01-14 DIAGNOSIS — M5432 Sciatica, left side: Secondary | ICD-10-CM

## 2019-01-14 MED ORDER — CELECOXIB 200 MG PO CAPS: 200.0000 mg | ORAL_CAPSULE | Freq: Two times a day (BID) | ORAL | 4 refills | Status: AC

## 2019-01-14 MED ORDER — TRIAMCINOLONE ACETONIDE 40 MG/ML IJ SUSP
40.0000 mg | Freq: Once | INTRAMUSCULAR | Status: AC
  Administered 2019-01-14: 16:00:00 40 mg
  Filled 2019-01-14: qty 1

## 2019-01-14 MED ORDER — IOPAMIDOL (ISOVUE-M 200) INJECTION 41%
20.0000 mL | Freq: Once | INTRAMUSCULAR | Status: DC | PRN
  Administered 2019-01-14: 16:00:00 20 mL
  Filled 2019-01-14: qty 20

## 2019-01-14 MED ORDER — ROPIVACAINE HCL 2 MG/ML IJ SOLN
10.0000 mL | Freq: Once | INTRAMUSCULAR | Status: AC
  Administered 2019-01-14: 16:00:00 10 mL via EPIDURAL
  Filled 2019-01-14: qty 10

## 2019-01-14 MED ORDER — LIDOCAINE HCL (PF) 1 % IJ SOLN
5.0000 mL | Freq: Once | INTRAMUSCULAR | Status: AC
  Administered 2019-01-14: 16:00:00 5 mL via SUBCUTANEOUS
  Filled 2019-01-14: qty 5

## 2019-01-14 MED ORDER — SODIUM CHLORIDE 0.9% FLUSH
10.0000 mL | Freq: Once | INTRAVENOUS | Status: AC
  Administered 2019-01-14: 16:00:00 10 mL

## 2019-01-14 MED ORDER — CYCLOBENZAPRINE HCL 10 MG PO TABS: 10.0000 mg | ORAL_TABLET | Freq: Two times a day (BID) | ORAL | 4 refills | Status: AC

## 2019-01-14 MED ORDER — SODIUM CHLORIDE (PF) 0.9 % IJ SOLN
INTRAMUSCULAR | Status: AC
  Filled 2019-01-14: qty 10

## 2019-01-14 NOTE — Progress Notes (Signed)
Safety precautions to be maintained throughout the outpatient stay will include: orient to surroundings, keep bed in low position, maintain call bell within reach at all times, provide assistance with transfer out of bed and ambulation.  

## 2019-01-14 NOTE — Patient Instructions (Signed)

## 2019-01-20 NOTE — Progress Notes (Signed)
Subjective:  Patient ID: Thomas [name], male    DOB: [birth year]  Age: 85 y.o. MRN: [id number]  CC: Back Pain (lower)   Procedure: L3-4 epidural steroid under fluoroscopic guidance with no sedation  HPI Thomas [name] presents for reevaluation.  He is a patient well-known to the pain control center and has had a chronic history of low back pain with bilateral calf pain.  This is intermittently worse on the right side than left and he is responded favorably to epidurals in the past.  His last injection was in March 2020.  He generally gets 75 to 85% relief lasting 2 to 3 months before he gets gradual recurrence of the same quality characteristic and distribution of pain.  He reports some mild give way weakness overlying the right thigh which has intensified recently.  This is been troublesome.  He continues to run effectively and this does seem to alleviate much of his back pain.  Unfortunately the weakness is relatively new.  He has been off his blood thinners for the appropriate amount of time prior to potential injection today.  No new changes in bowel or bladder function are noted in the quality characteristic and distribution of the pain are similar to what he is experienced in the past and has effectively responded to epidural steroid injection  Outpatient Medications Prior to Visit  Medication Sig Dispense Refill  . allopurinol (ZYLOPRIM) 100 MG tablet Take 2 tablets (200 mg total) by mouth daily. (Patient taking differently: Take 100 mg by mouth daily. ) 180 tablet 1  . amLODipine (NORVASC) 5 MG tablet TAKE 1 TABLET BY MOUTH DAILY 180 tablet 4  . apixaban (ELIQUIS) 2.5 MG TABS tablet Take 1 tablet (2.5 mg total) by mouth 2 (two) times daily. 60 tablet 0  . atorvastatin (LIPITOR) 40 MG tablet Take 1 tablet (40 mg total) by mouth daily at 6 PM. Please call to schedule appointment. 30 tablet 0  . Cholecalciferol (VITAMIN D3) 5000 units CAPS Take 5,000 Units by mouth daily.    . Coenzyme Q10  (COQ10) 100 MG CAPS Take 100 mg by mouth daily.    . colchicine (COLCRYS) 0.6 MG tablet Take 1 tablet (0.6 mg total) by mouth daily as needed (for gout). 30 tablet 5  . diclofenac sodium (VOLTAREN) 1 % GEL Apply 2 g topically daily as needed (for pain).     Marland Kitchen dofetilide (TIKOSYN) 125 MCG capsule TAKE 1 CAPSULE BY MOUTH TWICE A DAY 180 capsule 1  . dutasteride (AVODART) 0.5 MG capsule Take 0.5 mg by mouth daily as needed (for urination).     Marland Kitchen esomeprazole (NEXIUM) 40 MG capsule Take 40 mg by mouth daily as needed (for heartburn).     . hydrALAZINE (APRESOLINE) 25 MG tablet Take 1 tablet (25 mg total) by mouth 3 (three) times daily as needed. (Patient taking differently: Take 25 mg by mouth 3 (three) times daily as needed (for BP > 150). ) 90 tablet 6  . metroNIDAZOLE (FLAGYL) 500 MG tablet TAKE 1 TABLET BY MOUTH TWICE DAILY ON SUNDAY AND MONDAY 30 tablet 11  . Multiple Vitamins-Minerals (CENTRUM SILVER 50+MEN PO) Take 1 tablet by mouth daily.    . nebivolol (BYSTOLIC) 5 MG tablet Take 1 tablet (5 mg total) by mouth daily. 90 tablet 3  . NON FORMULARY Testosterone gel compound - apply 1 cc per day    . propranolol (INDERAL) 40 MG tablet TAKE ONE (1) TABLET THREE (3) TIMES EACHDAY AS NEEDED (Patient  taking differently: TAKE ONE (1) TABLET THREE (3) TIMES daily AS NEEDED for afib) 270 tablet 1  . silodosin (RAPAFLO) 8 MG CAPS capsule Take 8 mg by mouth daily as needed (for urintation).     . temazepam (RESTORIL) 30 MG capsule temazepam 30 mg capsule    . terazosin (HYTRIN) 1 MG capsule Take 1 mg by mouth at bedtime.    . Testosterone 20.25 MG/ACT (1.62%) GEL Apply 1 pump to each shoulder daily 75 g 2  . testosterone cypionate (DEPOTESTOSTERONE CYPIONATE) 200 MG/ML injection Inject 1 mL (200 mg total) into the muscle every 28 (twenty-eight) days. 10 mL 5  . traMADol (ULTRAM) 50 MG tablet Take 50 mg by mouth every 6 (six) hours as needed for moderate pain.     . Turmeric 500 MG CAPS Take 500 mg by mouth  daily.    . vitamin E 400 UNIT capsule Take 400 Units by mouth daily.    . cefdinir (OMNICEF) 300 MG capsule Take 1 capsule (300 mg total) by mouth daily. (Patient not taking: Reported on 01/14/2019) 30 capsule 5  . doxycycline (VIBRA-TABS) 100 MG tablet Take 1 tablet (100 mg total) by mouth daily. (Patient not taking: Reported on 07/08/2018) 30 tablet 5   No facility-administered medications prior to visit.     Review of Systems CNS: No confusion or sedation Cardiac: No angina or palpitations GI: No abdominal pain or constipation Constitutional: No nausea vomiting fevers or chills  Objective:  BP (!) 169/86   Pulse 70   Temp 98 F (36.7 C) (Oral)   Resp 18   Ht 5\' 7"  (1.702 m)   Wt 160 lb (72.6 kg)   SpO2 98%   BMI 25.06 kg/m    BP Readings from Last 3 Encounters:  01/14/19 (!) 169/86  01/12/19 138/78  07/08/18 (!) 161/82     Wt Readings from Last 3 Encounters:  01/14/19 160 lb (72.6 kg)  01/12/19 163 lb 4 oz (74 kg)  07/08/18 172 lb (78 kg)     Physical Exam Pt is alert and oriented PERRL EOMI HEART IS RRR no murmur or rub LCTA no wheezing or rales MUSCULOSKELETAL.  Reveals some paraspinous muscle tenderness but no overt trigger points.  He has good muscle tone and bulk that seems equal bilaterally.  He walks with a mildly antalgic gait but seems to be getting around quite well.  Labs  Lab Results  Component Value Date   HGBA1C 5.6 07/11/2016   HGBA1C 5.5 06/14/2016   Lab Results  Component Value Date   LDLCALC 74 02/24/2018   CREATININE 1.64 (H) 01/12/2019    -------------------------------------------------------------------------------------------------------------------- Lab Results  Component Value Date   WBC 6.1 01/12/2019   HGB 16.0 01/12/2019   HCT 48.8 01/12/2019   PLT 181 01/12/2019   GLUCOSE 98 01/12/2019   CHOL 141 02/24/2018   TRIG 39 02/24/2018   HDL 59 02/24/2018   LDLCALC 74 02/24/2018   ALT 18 02/24/2018   AST 23 02/24/2018    NA 137 01/12/2019   K 4.7 01/12/2019   CL 97 01/12/2019   CREATININE 1.64 (H) 01/12/2019   BUN 21 01/12/2019   CO2 26 01/12/2019   TSH 2.260 11/21/2017   PSA 5.8 08/22/2014   INR 1.06 06/25/2016   HGBA1C 5.6 07/11/2016    --------------------------------------------------------------------------------------------------------------------- [facility] C-arm 1-60 Min No Report  Result Date: 01/14/2019 Fluoro was used, but no Radiologist interpretation will be provided. Please refer to "NOTES" tab for provider progress  note.    Assessment & Plan:   Thomas [name] was seen today for back pain.  Diagnoses and all orders for this visit:  DDD (degenerative disc disease), lumbar  Spinal stenosis of lumbar region without neurogenic claudication  Bilateral sciatica  Other orders -     triamcinolone acetonide (KENALOG-40) injection 40 mg -     sodium chloride flush (NS) 0.9 % injection 10 mL -     ropivacaine (PF) 2 mg/mL (0.2%) (NAROPIN) injection 10 mL -     lidocaine (PF) (XYLOCAINE) 1 % injection 5 mL -     iopamidol (ISOVUE-M) 41 % intrathecal injection 20 mL -     celecoxib (CELEBREX) 200 MG capsule; Take 1 capsule (200 mg total) by mouth 2 (two) times daily. -     cyclobenzaprine (FLEXERIL) 10 MG tablet; Take 1 tablet (10 mg total) by mouth 2 (two) times daily.        ----------------------------------------------------------------------------------------------------------------------  Problem List Items Addressed This Visit    None    Visit Diagnoses    DDD (degenerative disc disease), lumbar    -  Primary   Relevant Medications   triamcinolone acetonide (KENALOG-40) injection 40 mg (Completed)   celecoxib (CELEBREX) 200 MG capsule   cyclobenzaprine (FLEXERIL) 10 MG tablet   Spinal stenosis of lumbar region without neurogenic claudication       Bilateral sciatica       Relevant Medications   cyclobenzaprine (FLEXERIL) 10 MG tablet         ----------------------------------------------------------------------------------------------------------------------  1. DDD (degenerative disc disease), lumbar Based on findings today we will proceed with an epidural as requested by the patient.  The risks and benefits of been reviewed in full detail and all questions answered.  I want him to restart his Eliquis tomorrow.  We will have him follow-up on a as needed basis for repeat epidural if indicated.  Should the right lower extremity symptoms persist I talked to him about possible neurosurgical evaluation for decompressive surgery if indicated.  2. Spinal stenosis of lumbar region without neurogenic claudication We will proceed with a repeat epidural today after risks and benefits once again reviewed as mentioned  3. Bilateral sciatica As above and continue core stretching strengthening exercises with return to clinic on a as needed basis.    ----------------------------------------------------------------------------------------------------------------------  I am having Thomas [name] start on celecoxib and cyclobenzaprine. I am also having him maintain his esomeprazole, Voltaren, hydrALAZINE, terazosin, traMADol, propranolol, dutasteride, silodosin, testosterone cypionate, Multiple Vitamins-Minerals (CENTRUM SILVER 50+MEN PO), CoQ10, vitamin E, Vitamin D3, Turmeric, NON FORMULARY, temazepam, colchicine, allopurinol, nebivolol, doxycycline, cefdinir, dofetilide, Testosterone, apixaban, amLODipine, metroNIDAZOLE, and atorvastatin. We administered triamcinolone acetonide, sodium chloride flush, ropivacaine (PF) 2 mg/mL (0.2%), lidocaine (PF), and iopamidol.   Meds ordered this encounter  Medications  . triamcinolone acetonide (KENALOG-40) injection 40 mg  . sodium chloride flush (NS) 0.9 % injection 10 mL  . ropivacaine (PF) 2 mg/mL (0.2%) (NAROPIN) injection 10 mL  . lidocaine (PF) (XYLOCAINE) 1 % injection 5 mL  . iopamidol  (ISOVUE-M) 41 % intrathecal injection 20 mL  . celecoxib (CELEBREX) 200 MG capsule    Sig: Take 1 capsule (200 mg total) by mouth 2 (two) times daily.    Dispense:  60 capsule    Refill:  4  . cyclobenzaprine (FLEXERIL) 10 MG tablet    Sig: Take 1 tablet (10 mg total) by mouth 2 (two) times daily.    Dispense:  60 tablet    Refill:  4  Patient's Medications  New Prescriptions   CELECOXIB (CELEBREX) 200 MG CAPSULE    Take 1 capsule (200 mg total) by mouth 2 (two) times daily.   CYCLOBENZAPRINE (FLEXERIL) 10 MG TABLET    Take 1 tablet (10 mg total) by mouth 2 (two) times daily.  Previous Medications   ALLOPURINOL (ZYLOPRIM) 100 MG TABLET    Take 2 tablets (200 mg total) by mouth daily.   AMLODIPINE (NORVASC) 5 MG TABLET    TAKE 1 TABLET BY MOUTH DAILY   APIXABAN (ELIQUIS) 2.5 MG TABS TABLET    Take 1 tablet (2.5 mg total) by mouth 2 (two) times daily.   ATORVASTATIN (LIPITOR) 40 MG TABLET    Take 1 tablet (40 mg total) by mouth daily at 6 PM. Please call to schedule appointment.   CEFDINIR (OMNICEF) 300 MG CAPSULE    Take 1 capsule (300 mg total) by mouth daily.   CHOLECALCIFEROL (VITAMIN D3) 5000 UNITS CAPS    Take 5,000 Units by mouth daily.   COENZYME Q10 (COQ10) 100 MG CAPS    Take 100 mg by mouth daily.   COLCHICINE (COLCRYS) 0.6 MG TABLET    Take 1 tablet (0.6 mg total) by mouth daily as needed (for gout).   DICLOFENAC SODIUM (VOLTAREN) 1 % GEL    Apply 2 g topically daily as needed (for pain).    DOFETILIDE (TIKOSYN) 125 MCG CAPSULE    TAKE 1 CAPSULE BY MOUTH TWICE A DAY   DOXYCYCLINE (VIBRA-TABS) 100 MG TABLET    Take 1 tablet (100 mg total) by mouth daily.   DUTASTERIDE (AVODART) 0.5 MG CAPSULE    Take 0.5 mg by mouth daily as needed (for urination).    ESOMEPRAZOLE (NEXIUM) 40 MG CAPSULE    Take 40 mg by mouth daily as needed (for heartburn).    HYDRALAZINE (APRESOLINE) 25 MG TABLET    Take 1 tablet (25 mg total) by mouth 3 (three) times daily as needed.   METRONIDAZOLE (FLAGYL)  500 MG TABLET    TAKE 1 TABLET BY MOUTH TWICE DAILY ON SUNDAY AND MONDAY   MULTIPLE VITAMINS-MINERALS (CENTRUM SILVER 50+MEN PO)    Take 1 tablet by mouth daily.   NEBIVOLOL (BYSTOLIC) 5 MG TABLET    Take 1 tablet (5 mg total) by mouth daily.   NON FORMULARY    Testosterone gel compound - apply 1 cc per day   PROPRANOLOL (INDERAL) 40 MG TABLET    TAKE ONE (1) TABLET THREE (3) TIMES EACHDAY AS NEEDED   SILODOSIN (RAPAFLO) 8 MG CAPS CAPSULE    Take 8 mg by mouth daily as needed (for urintation).    TEMAZEPAM (RESTORIL) 30 MG CAPSULE    temazepam 30 mg capsule   TERAZOSIN (HYTRIN) 1 MG CAPSULE    Take 1 mg by mouth at bedtime.   TESTOSTERONE 20.25 MG/ACT (1.62%) GEL    Apply 1 pump to each shoulder daily   TESTOSTERONE CYPIONATE (DEPOTESTOSTERONE CYPIONATE) 200 MG/ML INJECTION    Inject 1 mL (200 mg total) into the muscle every 28 (twenty-eight) days.   TRAMADOL (ULTRAM) 50 MG TABLET    Take 50 mg by mouth every 6 (six) hours as needed for moderate pain.    TURMERIC 500 MG CAPS    Take 500 mg by mouth daily.   VITAMIN E 400 UNIT CAPSULE    Take 400 Units by mouth daily.  Modified Medications   No medications on file  Discontinued Medications   No medications on file   ----------------------------------------------------------------------------------------------------------------------  Follow-up: Return in about 2 months (around 03/16/2019) for evaluation, procedure.   Procedure: L3-4 LESI with fluoroscopic guidance and no moderate sedation  NOTE: The risks, benefits, and expectations of the procedure have been discussed and explained to the patient who was understanding and in agreement with suggested treatment plan. No guarantees were made.  DESCRIPTION OF PROCEDURE: Lumbar epidural steroid injection with no IV Versed, EKG, blood pressure, pulse, and pulse oximetry monitoring. The procedure was performed with the patient in the prone position under fluoroscopic guidance.  Sterile prep x3 was  initiated and I then injected subcutaneous lidocaine to the overlying L3-4 site after its fluoroscopic identifictation.  Using strict aseptic technique, I then advanced an 18-gauge Tuohy epidural needle in the midline using interlaminar approach via loss-of-resistance to saline technique. There was negative aspiration for heme or  CSF.  I then confirmed position with both AP and Lateral fluoroscan.  2 cc of contrast dye were injected and a  total of 5 mL of Preservative-Free normal saline mixed with 40 mg of Kenalog and 1cc Ropicaine 0.2 percent were injected incrementally via the  epidurally placed needle. The needle was removed. The patient tolerated the injection well and was convalesced and discharged to home in stable condition. Should the patient have any post procedure difficulty they have been instructed on how to contact us for assistance.    Molli Barrows, MD

## 2019-01-27 ENCOUNTER — Telehealth: Payer: Self-pay | Admitting: Family Medicine

## 2019-01-27 MED ORDER — ALLOPURINOL 100 MG PO TABS: 100.0000 mg | ORAL_TABLET | Freq: Every day | ORAL | 0 refills | Status: DC

## 2019-01-27 NOTE — Telephone Encounter (Signed)
Ok to refill 

## 2019-01-27 NOTE — Telephone Encounter (Signed)
yes

## 2019-01-27 NOTE — Telephone Encounter (Signed)
Done

## 2019-01-27 NOTE — Telephone Encounter (Signed)
Pt needing a refill on:  allopurinol (ZYLOPRIM) 100 MG tablet - [place] the [place].  Please fill at:  [facility] [place], [place] - [address] [phone] (Phone) [phone] (Fax)    Thanks, [employer]

## 2019-02-02 ENCOUNTER — Ambulatory Visit (INDEPENDENT_AMBULATORY_CARE_PROVIDER_SITE_OTHER): Payer: Medicare Other | Admitting: *Deleted

## 2019-02-02 DIAGNOSIS — I4819 Other persistent atrial fibrillation: Secondary | ICD-10-CM | POA: Diagnosis not present

## 2019-02-02 DIAGNOSIS — I442 Atrioventricular block, complete: Secondary | ICD-10-CM

## 2019-02-02 LAB — CUP PACEART REMOTE DEVICE CHECK
Battery Remaining Longevity: 98 mo
Battery Voltage: 3 V
Brady Statistic AP VP Percent: 51.02 %
Brady Statistic AP VS Percent: 0.01 %
Brady Statistic AS VP Percent: 48.72 %
Brady Statistic AS VS Percent: 0.24 %
Brady Statistic RA Percent Paced: 50.97 %
Brady Statistic RV Percent Paced: 99.74 %
Date Time Interrogation Session: 20200929054947
Implantable Lead Implant Date: 20180214
Implantable Lead Implant Date: 20180214
Implantable Lead Location: 753859
Implantable Lead Location: 753860
Implantable Lead Model: 5076
Implantable Lead Model: 5076
Implantable Pulse Generator Implant Date: 20180214
Lead Channel Impedance Value: 323 Ohm
Lead Channel Impedance Value: 361 Ohm
Lead Channel Impedance Value: 399 Ohm
Lead Channel Impedance Value: 418 Ohm
Lead Channel Pacing Threshold Amplitude: 0.5 V
Lead Channel Pacing Threshold Amplitude: 0.875 V
Lead Channel Pacing Threshold Pulse Width: 0.4 ms
Lead Channel Pacing Threshold Pulse Width: 0.4 ms
Lead Channel Sensing Intrinsic Amplitude: 22.25 mV
Lead Channel Sensing Intrinsic Amplitude: 22.25 mV
Lead Channel Sensing Intrinsic Amplitude: 3.625 mV
Lead Channel Sensing Intrinsic Amplitude: 3.625 mV
Lead Channel Setting Pacing Amplitude: 2 V
Lead Channel Setting Pacing Amplitude: 2.5 V
Lead Channel Setting Pacing Pulse Width: 0.4 ms
Lead Channel Setting Sensing Sensitivity: 0.9 mV

## 2019-02-05 ENCOUNTER — Other Ambulatory Visit: Payer: Self-pay

## 2019-02-05 ENCOUNTER — Ambulatory Visit (INDEPENDENT_AMBULATORY_CARE_PROVIDER_SITE_OTHER): Payer: Medicare Other

## 2019-02-05 DIAGNOSIS — I4819 Other persistent atrial fibrillation: Secondary | ICD-10-CM | POA: Diagnosis not present

## 2019-02-10 ENCOUNTER — Other Ambulatory Visit: Payer: Self-pay | Admitting: *Deleted

## 2019-02-10 DIAGNOSIS — I4819 Other persistent atrial fibrillation: Secondary | ICD-10-CM

## 2019-02-10 LAB — CUP PACEART INCLINIC DEVICE CHECK
Brady Statistic RA Percent Paced: 89.2 %
Brady Statistic RV Percent Paced: 99.7 %
Date Time Interrogation Session: 20201007171048
Implantable Lead Implant Date: 20180214
Implantable Lead Implant Date: 20180214
Implantable Lead Location: 753859
Implantable Lead Location: 753860
Implantable Lead Model: 5076
Implantable Lead Model: 5076
Implantable Pulse Generator Implant Date: 20180214
Lead Channel Impedance Value: 418 Ohm
Lead Channel Impedance Value: 437 Ohm
Lead Channel Pacing Threshold Amplitude: 0.5 V
Lead Channel Pacing Threshold Amplitude: 1 V
Lead Channel Pacing Threshold Pulse Width: 0.4 ms
Lead Channel Pacing Threshold Pulse Width: 0.4 ms
Lead Channel Sensing Intrinsic Amplitude: 3.1 mV
Lead Channel Setting Pacing Amplitude: 2 V
Lead Channel Setting Pacing Amplitude: 2.5 V
Lead Channel Setting Pacing Pulse Width: 0.4 ms
Lead Channel Setting Sensing Sensitivity: 0.9 mV

## 2019-02-12 NOTE — Progress Notes (Signed)
Remote pacemaker transmission.   

## 2019-02-15 ENCOUNTER — Other Ambulatory Visit: Payer: Self-pay | Admitting: Internal Medicine

## 2019-02-15 ENCOUNTER — Other Ambulatory Visit: Payer: Self-pay | Admitting: Cardiovascular Disease

## 2019-02-15 NOTE — Telephone Encounter (Signed)
Pt's age 85, wt 72.6 kg, SCr 1.64, CrCl 34.43, last ov w/ SK 01/10/19.

## 2019-02-15 NOTE — Telephone Encounter (Signed)
Refill request

## 2019-02-16 ENCOUNTER — Ambulatory Visit (INDEPENDENT_AMBULATORY_CARE_PROVIDER_SITE_OTHER): Payer: Medicare Other | Admitting: Internal Medicine

## 2019-02-16 ENCOUNTER — Other Ambulatory Visit: Payer: Self-pay | Admitting: Urology

## 2019-02-16 ENCOUNTER — Other Ambulatory Visit: Payer: Self-pay

## 2019-02-16 ENCOUNTER — Encounter: Payer: Self-pay | Admitting: Internal Medicine

## 2019-02-16 ENCOUNTER — Other Ambulatory Visit: Payer: Self-pay | Admitting: Internal Medicine

## 2019-02-16 VITALS — BP 142/80 | HR 60 | Ht 67.0 in | Wt 164.5 lb

## 2019-02-16 DIAGNOSIS — I442 Atrioventricular block, complete: Secondary | ICD-10-CM | POA: Diagnosis not present

## 2019-02-16 DIAGNOSIS — I4819 Other persistent atrial fibrillation: Secondary | ICD-10-CM | POA: Diagnosis not present

## 2019-02-16 DIAGNOSIS — E291 Testicular hypofunction: Secondary | ICD-10-CM

## 2019-02-16 DIAGNOSIS — Z950 Presence of cardiac pacemaker: Secondary | ICD-10-CM

## 2019-02-16 LAB — CUP PACEART INCLINIC DEVICE CHECK
Battery Remaining Longevity: 98 mo
Battery Voltage: 2.99 V
Brady Statistic AP VP Percent: 61.98 %
Brady Statistic AP VS Percent: 0.01 %
Brady Statistic AS VP Percent: 37.85 %
Brady Statistic AS VS Percent: 0.15 %
Brady Statistic RA Percent Paced: 59.74 %
Brady Statistic RV Percent Paced: 99.84 %
Date Time Interrogation Session: 20201013150514
Implantable Lead Implant Date: 20180214
Implantable Lead Implant Date: 20180214
Implantable Lead Location: 753859
Implantable Lead Location: 753860
Implantable Lead Model: 5076
Implantable Lead Model: 5076
Implantable Pulse Generator Implant Date: 20180214
Lead Channel Impedance Value: 323 Ohm
Lead Channel Impedance Value: 361 Ohm
Lead Channel Impedance Value: 399 Ohm
Lead Channel Impedance Value: 437 Ohm
Lead Channel Pacing Threshold Amplitude: 0.5 V
Lead Channel Pacing Threshold Amplitude: 1 V
Lead Channel Pacing Threshold Pulse Width: 0.4 ms
Lead Channel Pacing Threshold Pulse Width: 0.4 ms
Lead Channel Sensing Intrinsic Amplitude: 4 mV
Lead Channel Setting Pacing Amplitude: 2 V
Lead Channel Setting Pacing Amplitude: 2.5 V
Lead Channel Setting Pacing Pulse Width: 0.4 ms
Lead Channel Setting Sensing Sensitivity: 0.9 mV

## 2019-02-16 MED ORDER — TESTOSTERONE 20.25 MG/ACT (1.62%) TD GEL: TRANSDERMAL | 2 refills | Status: DC

## 2019-02-16 NOTE — Telephone Encounter (Signed)
Patient is out and leaving town  *STAT* If patient is at the pharmacy, call can be transferred to refill team.   1. Which medications need to be refilled? (please list name of each medication and dose if known)    Tikosyn 125 mcg PO BID  2. Which pharmacy/location (including street and city if local pharmacy) is medication to be sent to?   [facility]   3. Do they need a 30 day or 90 day supply? [place]

## 2019-02-16 NOTE — Progress Notes (Signed)
Patient Care Team: Jerrol Banana., MD as PCP - General (Family Medicine) Rockey Situ Kathlene November, MD as Consulting Physician (Cardiology) Deboraha Sprang, MD as Consulting Physician (Cardiology) Leandrew Koyanagi, MD as Referring Physician (Ophthalmology) Serena Croissant, MD as Consulting Physician (Family Medicine) Wellington Hampshire, MD as Consulting Physician (Cardiology)   HPI  Thomas [name] is a 85 y.o. male Seen in follow-up for pacemaker implanted 2/18 for complete heart block. He also has paroxysmal atrial flutter  on anticoagulation his CHADS-VASc score is greater than or equal to 6 with a prior TIA.    DATE TEST EF   2/18 Echo   55-60 % Reportedly interval normlization  6/19 LHC   3 V CAD moderate>>medical Rx   9/20 Echo  45.-50% LA size is better     With worsening shortness of breath he was admitted for Tikosyn.  This was successful in restoring sinus rhythm.  Pre-evaluation for coronary disease included a CT scan which demonstrated pneumonia.  Followup CXR 7/19 full resolution   He has been holding sinus rhythm.  Exercise tolerance remains overall much improved.   He continues not to be able to increase heart rate.  No awareness of interval atrial fibrillation.  No chest pain or edema.   No bleeding   Date Cr K Mg Hgb  6/18 1.46   14.4  5/19 1.47 4.3 2.0 16  10/19 1.55 4.5  16.8  1/20 1.47 4.6  16.5  9/20 1.64 4.7 2.2 16.0       Past Medical History:  Diagnosis Date  . Aortic valve disorders   . Arthritis   . Atrial flutter ([place]) 06/18/2016   "AF or AFl; not sure which" (06/23/2016)  . Basal cell carcinoma    "face, nose left shoulder, left arm" (06/19/2016)  . BBB (bundle branch block)    hx right  . Chronic back pain    "neck, thoracic, lower back" (06/19/2016)  . Complete heart block ([place]) 06/2016  . Dyspnea   . GERD (gastroesophageal reflux disease)   . Heart block    "I've had type I, II Wenke before now" (06/19/2016)  .  History of gout   . History of hiatal hernia    "self dx'd" (06/19/2016)  . Hyperlipidemia   . Hypertension   . Lyme disease    "dx'd by me 2003; cx's showed dx 08/2015"  . Migraine    "3-4/year" (06/19/2016)  . Presence of permanent cardiac pacemaker 06/19/2016  . PVC's (premature ventricular contractions)   . Renal cancer, left ([place]) 2006   S/P cryotherapy  . Spinal stenosis    "cervical, 1 thoracic, lumbar" (06/19/2016)  . Squamous carcinoma    "face, nose left shoulder, left arm" (06/19/2016)  . Stroke ([place])   . TIA (transient ischemic attack) 06/14/2016   "I'm not sure that's what it was" (06/25/2016)  . Visit for monitoring Tikosyn therapy 09/09/2017    Past Surgical History:  Procedure Laterality Date  . ANKLE FRACTURE SURGERY Right 1967  . BASAL CELL CARCINOMA EXCISION     "face, nose left shoulder, left arm"  . BIOPSY PROSTATE  2001 & 2003  . CARDIAC CATHETERIZATION  1990's  . CARDIOVERSION N/A 09/11/2017   Procedure: CARDIOVERSION;  Surgeon: Lelon Perla, MD;  Location: [facility] ENDOSCOPY;  Service: Cardiovascular;  Laterality: N/A;  . FRACTURE SURGERY    . INGUINAL HERNIA REPAIR Left 2012  . INSERT / REPLACE / REMOVE PACEMAKER  06/19/2016  .  LAPAROSCOPIC ABLATION RENAL MASS    . LEFT HEART CATH AND CORONARY ANGIOGRAPHY Left 10/23/2017   Procedure: LEFT HEART CATH AND CORONARY ANGIOGRAPHY;  Surgeon: Wellington Hampshire, MD;  Location: [facility];  Service: Cardiovascular;  Laterality: Left;  . PACEMAKER IMPLANT N/A 06/19/2016   Procedure: Pacemaker Implant;  Surgeon: Deboraha Sprang, MD;  Location: [facility];  Service: Cardiovascular;  Laterality: N/A;  . pacemasker    . PROSTATE SURGERY    . SQUAMOUS CELL CARCINOMA EXCISION     "face, nose left shoulder, left arm"  . TONSILLECTOMY AND ADENOIDECTOMY      Current Outpatient Medications  Medication Sig Dispense Refill  . allopurinol (ZYLOPRIM) 100 MG tablet Take 2 tablets (200 mg total) by mouth daily.  (Patient taking differently: Take 100 mg by mouth daily. ) 180 tablet 1  . allopurinol (ZYLOPRIM) 100 MG tablet Take 1 tablet (100 mg total) by mouth daily. 30 tablet 0  . amLODipine (NORVASC) 5 MG tablet TAKE 1 TABLET BY MOUTH DAILY 180 tablet 4  . apixaban (ELIQUIS) 2.5 MG TABS tablet Take 1 tablet (2.5 mg total) by mouth 2 (two) times daily. 60 tablet 0  . atorvastatin (LIPITOR) 40 MG tablet Take 1 tablet (40 mg total) by mouth daily at 6 PM. Please call to schedule appointment. 30 tablet 0  . Cholecalciferol (VITAMIN D3) 5000 units CAPS Take 5,000 Units by mouth daily.    . Coenzyme Q10 (COQ10) 100 MG CAPS Take 100 mg by mouth daily.    . colchicine (COLCRYS) 0.6 MG tablet Take 1 tablet (0.6 mg total) by mouth daily as needed (for gout). 30 tablet 5  . diclofenac sodium (VOLTAREN) 1 % GEL Apply 2 g topically daily as needed (for pain).     Marland Kitchen dofetilide (TIKOSYN) 125 MCG capsule TAKE 1 CAPSULE BY MOUTH TWICE A DAY 180 capsule 1  . dutasteride (AVODART) 0.5 MG capsule Take 0.5 mg by mouth daily as needed (for urination).     Marland Kitchen esomeprazole (NEXIUM) 40 MG capsule Take 40 mg by mouth daily as needed (for heartburn).     . hydrALAZINE (APRESOLINE) 25 MG tablet Take 1 tablet (25 mg total) by mouth 3 (three) times daily as needed. (Patient taking differently: Take 25 mg by mouth 3 (three) times daily as needed (for BP > 150). ) 90 tablet 6  . metroNIDAZOLE (FLAGYL) 500 MG tablet TAKE 1 TABLET BY MOUTH TWICE DAILY ON SUNDAY AND MONDAY 30 tablet 11  . Multiple Vitamins-Minerals (CENTRUM SILVER 50+MEN PO) Take 1 tablet by mouth daily.    . nebivolol (BYSTOLIC) 5 MG tablet Take 1 tablet (5 mg total) by mouth daily. 90 tablet 3  . NON FORMULARY Testosterone gel compound - apply 1 cc per day    . propranolol (INDERAL) 40 MG tablet TAKE ONE (1) TABLET THREE (3) TIMES EACHDAY AS NEEDED (Patient taking differently: TAKE ONE (1) TABLET THREE (3) TIMES daily AS NEEDED for afib) 270 tablet 1  . silodosin  (RAPAFLO) 8 MG CAPS capsule Take 8 mg by mouth daily as needed (for urintation).     . temazepam (RESTORIL) 30 MG capsule temazepam 30 mg capsule    . terazosin (HYTRIN) 1 MG capsule Take 1 mg by mouth at bedtime.    . terbinafine (LAMISIL) 250 MG tablet Take 250 mg by mouth daily as needed.     . Testosterone 20.25 MG/ACT (1.62%) GEL Apply 1 pump to each shoulder daily 75 g 2  .  testosterone cypionate (DEPOTESTOSTERONE CYPIONATE) 200 MG/ML injection Inject 1 mL (200 mg total) into the muscle every 28 (twenty-eight) days. 10 mL 5  . traMADol (ULTRAM) 50 MG tablet Take 50 mg by mouth every 6 (six) hours as needed for moderate pain.     . Turmeric 500 MG CAPS Take 500 mg by mouth daily.    . vitamin E 400 UNIT capsule Take 400 Units by mouth daily.    . cefdinir (OMNICEF) 300 MG capsule Take 1 capsule (300 mg total) by mouth daily. (Patient not taking: Reported on 02/16/2019) 30 capsule 5  . doxycycline (VIBRA-TABS) 100 MG tablet Take 1 tablet (100 mg total) by mouth daily. (Patient not taking: Reported on 02/16/2019) 30 tablet 5   No current facility-administered medications for this visit.     Allergies  Allergen Reactions  . Iodine Hives and Other (See Comments)    IVP contrast  . Penicillins Itching, Rash and Other (See Comments)    ITCHY FEELING IN FINGERS Has patient had a PCN reaction causing immediate rash, facial/tongue/throat swelling, SOB or lightheadedness with hypotension: No Has patient had a PCN reaction causing severe rash involving mucus membranes or skin necrosis: No Has patient had a PCN reaction that required hospitalization No Has patient had a PCN reaction occurring within the last 10 years: No If all of the above answers are "NO", then may proceed with Cephalosporin use.       Review of Systems negative except from HPI and PMH  Physical Exam BP (!) 142/80 (BP Location: Left Arm, Patient Position: Sitting, Cuff Size: Normal)   Pulse 60   Ht 5\' 7"  (1.702 m)    Wt 164 lb 8 oz (74.6 kg)   BMI 25.76 kg/m  Well developed and well nourished in no acute distress HENT normal Neck supple with JVP-flat Clear Device pocket well healed; without hematoma or erythema.  There is no tethering  Regular rate and rhythm, no murmur Abd-soft with active BS No Clubbing cyanosis   edema Skin-warm and dry A & Oriented  Grossly normal sensory and motor function  ECG  AV pacing @ 60 19/18/49 Upright QRS V1   Assessment and Plan:  Atrial fibrillation-persistent  TIA  Coronary artery disease- mod medical therapy  Sinus node dysfunction/chronotropic incompetence  Complete heart block    Hypertension  High Risk Medication Surveillance-Dofetilide  Pacemaker Med[employer]onic   Renal insufficiency grade 3  Sleep disordered breathing >>sleep study negative  Cardiomyopathy-mild    Persistent decrease in atrial fibrillation.  We will continue dofetilide.  Discussed whether he can stop it or not; it is his recollection and that of his wife that he feels much better with less atrial fibrillation as detected by the device and since he started dofetilide.  We will continue.  In this regard, it is notable that his left atrial dimensions by echo particularly his volume measurements are improved compared to 2018.  On Anticoagulation;  No bleeding issues   Modest change in LV function measurements.  Whether they are within the range of variability of the echo or not is not clear.  Reviewed with the patient and his wife over FaceTime possibility of pacemaker associated cardiomyopathy and that we will recheck an echo in 6 months time.  Dofetilide surveillance laboratories were normal. Without symptoms of ischemia  Discussed again limited heart rate excursions.  Had activated rate response.  Heart rate was 90 and walking in the office.  I wonder whether this may be too exuberant.  He will let us know.  We spent more than 50% of our >25 min visit in face to face counseling  regarding the above

## 2019-02-16 NOTE — Patient Instructions (Signed)
Medication Instructions:  - Your physician recommends that you continue on your current medications as directed. Please refer to the Current Medication list given to you today.  If you need a refill on your cardiac medications before your next appointment, please call your pharmacy.   Lab work: - none ordered  If you have labs (blood work) drawn today and your tests are completely normal, you will receive your results only by: Marland Kitchen MyChart Message (if you have MyChart) OR . A paper copy in the mail If you have any lab test that is abnormal or we need to change your treatment, we will call you to review the results.  Testing/Procedures: - none ordered  Follow-Up: At [facility], you and your health needs are our priority.  As part of our continuing mission to provide you with exceptional heart care, we have created designated Provider Care Teams.  These Care Teams include your primary Cardiologist (physician) and Advanced Practice Providers (APPs -  Physician Assistants and Nurse Practitioners) who all work together to provide you with the care you need, when you need it.  . You will need a follow up appointment in 6 months with Dr. Caryl Comes (April 2021) . Please call our office 2 months in advance to schedule this appointment. (Call in early August February to schedule).   Remote monitoring is used to monitor your Pacemaker of ICD from home. This monitoring reduces the number of office visits required to check your device to one time per year. It allows [place] to keep an eye on the functioning of your device to ensure it is working properly. You are scheduled for a device check from home on 05/04/19. You may send your transmission at any time that day. If you have a wireless device, the transmission will be sent automatically. After your physician reviews your transmission, you will receive a postcard with your next transmission date.   Any Other Special Instructions Will Be Listed Below (If  Applicable). - N/A

## 2019-02-17 MED ORDER — DOFETILIDE 125 MCG PO CAPS: 125.0000 ug | ORAL_CAPSULE | Freq: Two times a day (BID) | ORAL | 1 refills | Status: DC

## 2019-02-17 NOTE — Telephone Encounter (Signed)
Requested Prescriptions   Signed Prescriptions Disp Refills  . dofetilide (TIKOSYN) 125 MCG capsule 180 capsule 1    Sig: Take 1 capsule (125 mcg total) by mouth 2 (two) times daily.    Authorizing Provider: Deboraha Sprang    Ordering User: Raelene Bott, Stacy Deshler L

## 2019-02-22 ENCOUNTER — Telehealth: Payer: Self-pay | Admitting: Family Medicine

## 2019-02-22 NOTE — Telephone Encounter (Signed)
Pt called saying he is having some problems with the  Eliquis prescription.  He needs the 5mg  with a 90 day supply.  He keeps getting the 2.5 but he needs the 5 mg because it was changed to that a while back by Dr. Rockey Situ.  Dr. Rockey Situ use to prescribe this for him but Dr. Rosanna Randy has been taking care of it for him  [facility]    Thanks teri

## 2019-02-22 NOTE — Telephone Encounter (Signed)
I am fine sending him to 5 mg twice a day but the last cardiology note reads 2.5 twice a day.  I just want to send in the correct dose.  Let me know.

## 2019-02-22 NOTE — Telephone Encounter (Signed)
Please advise 

## 2019-02-22 NOTE — Telephone Encounter (Signed)
Left message to call back  

## 2019-02-24 ENCOUNTER — Telehealth: Payer: Self-pay | Admitting: Family Medicine

## 2019-02-24 NOTE — Telephone Encounter (Signed)
Left message to call back. He also has another message regarding this.

## 2019-02-24 NOTE — Telephone Encounter (Signed)
Tried calling patient again about this, and unable to get in contact to clarify. Will try again later.

## 2019-02-24 NOTE — Telephone Encounter (Signed)
Tried calling patient again, no answer. Left message to call back.

## 2019-02-24 NOTE — Telephone Encounter (Signed)
Pt called stating that his apixaban (ELIQUIS) 2.5 MG TABS tablet is needing to be increased. Keeps having Afib.  Needing to get the Eliquis back to 5 mg.  Please call pt if needing to discuss.   Pt uses:  [place], [place] - [phone] (Phone) [phone] (Fax)    Thanks, [facility]

## 2019-02-25 NOTE — Telephone Encounter (Signed)
Left message to call back  

## 2019-02-26 NOTE — Telephone Encounter (Signed)
Unable to contact the patient

## 2019-02-26 NOTE — Telephone Encounter (Signed)
Unable to contact the patient to clarify. This must be done before sent in.

## 2019-03-01 NOTE — Telephone Encounter (Signed)
Patient reports that this was changed to 5mg  because the 2.5mg  was not helping. He initally started out with the 5mg  dose, but he had to cut them in half last year.   He has been on 5mg  for a "while", and reports that it helps with his fibrillation. Please send in refills into the pharmacy. [facility].

## 2019-03-03 MED ORDER — APIXABAN 5 MG PO TABS: 5.0000 mg | ORAL_TABLET | Freq: Two times a day (BID) | ORAL | 5 refills | Status: DC

## 2019-03-03 NOTE — Telephone Encounter (Signed)
The Eliquis is a blood thinner only, it is not to control the atrial fibrillation.  They adjusted dose  down because of age and kidney function.  Okay to give him the 5 mg dose but the 2.5 will be more appropriate.

## 2019-03-03 NOTE — Addendum Note (Signed)
Addended by: Wilburt Finlay L on: 03/03/2019 10:12 AM   Modules accepted: Orders

## 2019-03-08 ENCOUNTER — Other Ambulatory Visit: Payer: Self-pay | Admitting: Cardiovascular Disease

## 2019-03-31 ENCOUNTER — Other Ambulatory Visit: Payer: Self-pay | Admitting: Anesthesiology

## 2019-03-31 ENCOUNTER — Encounter: Payer: Self-pay | Admitting: Anesthesiology

## 2019-03-31 ENCOUNTER — Ambulatory Visit
Admission: RE | Admit: 2019-03-31 | Discharge: 2019-03-31 | Disposition: A | Discharge status: Home / Self Care | Payer: Medicare Other | Source: Ambulatory Visit | Attending: Anesthesiology | Admitting: Anesthesiology

## 2019-03-31 ENCOUNTER — Emergency Department
Admission: EM | Admit: 2019-03-31 | Discharge: 2019-03-31 | Disposition: A | Discharge status: Home / Self Care | Payer: Medicare Other | Attending: Emergency Medicine | Admitting: Emergency Medicine

## 2019-03-31 ENCOUNTER — Other Ambulatory Visit: Payer: Self-pay

## 2019-03-31 ENCOUNTER — Encounter: Payer: Self-pay | Admitting: Emergency Medicine

## 2019-03-31 ENCOUNTER — Ambulatory Visit (HOSPITAL_BASED_OUTPATIENT_CLINIC_OR_DEPARTMENT_OTHER): Payer: Medicare Other | Admitting: Anesthesiology

## 2019-03-31 VITALS — BP 152/92 | HR 77 | Temp 97.6°F | Resp 13 | Ht 67.0 in | Wt 160.0 lb

## 2019-03-31 DIAGNOSIS — M5431 Sciatica, right side: Secondary | ICD-10-CM | POA: Insufficient documentation

## 2019-03-31 DIAGNOSIS — M48061 Spinal stenosis, lumbar region without neurogenic claudication: Secondary | ICD-10-CM | POA: Insufficient documentation

## 2019-03-31 DIAGNOSIS — Y999 Unspecified external cause status: Secondary | ICD-10-CM | POA: Diagnosis not present

## 2019-03-31 DIAGNOSIS — Z79899 Other long term (current) drug therapy: Secondary | ICD-10-CM | POA: Diagnosis not present

## 2019-03-31 DIAGNOSIS — T18128A Food in esophagus causing other injury, initial encounter: Secondary | ICD-10-CM | POA: Insufficient documentation

## 2019-03-31 DIAGNOSIS — I10 Essential (primary) hypertension: Secondary | ICD-10-CM | POA: Insufficient documentation

## 2019-03-31 DIAGNOSIS — M5432 Sciatica, left side: Secondary | ICD-10-CM | POA: Insufficient documentation

## 2019-03-31 DIAGNOSIS — X58XXXA Exposure to other specified factors, initial encounter: Secondary | ICD-10-CM | POA: Diagnosis not present

## 2019-03-31 DIAGNOSIS — R52 Pain, unspecified: Secondary | ICD-10-CM

## 2019-03-31 DIAGNOSIS — Z7901 Long term (current) use of anticoagulants: Secondary | ICD-10-CM | POA: Diagnosis not present

## 2019-03-31 DIAGNOSIS — Y939 Activity, unspecified: Secondary | ICD-10-CM | POA: Diagnosis not present

## 2019-03-31 DIAGNOSIS — K222 Esophageal obstruction: Secondary | ICD-10-CM

## 2019-03-31 DIAGNOSIS — R0989 Other specified symptoms and signs involving the circulatory and respiratory systems: Secondary | ICD-10-CM | POA: Diagnosis present

## 2019-03-31 DIAGNOSIS — Y929 Unspecified place or not applicable: Secondary | ICD-10-CM | POA: Insufficient documentation

## 2019-03-31 DIAGNOSIS — M5136 Other intervertebral disc degeneration, lumbar region: Secondary | ICD-10-CM

## 2019-03-31 LAB — CBC WITH DIFFERENTIAL/PLATELET
Abs Immature Granulocytes: 0.02 10*3/uL (ref 0.00–0.07)
Basophils Absolute: 0.1 10*3/uL (ref 0.0–0.1)
Basophils Relative: 1 %
Eosinophils Absolute: 0.1 10*3/uL (ref 0.0–0.5)
Eosinophils Relative: 2 %
HCT: 46 % (ref 39.0–52.0)
Hemoglobin: 15.2 g/dL (ref 13.0–17.0)
Immature Granulocytes: 0 %
Lymphocytes Relative: 35 %
Lymphs Abs: 2.1 10*3/uL (ref 0.7–4.0)
MCH: 29.6 pg (ref 26.0–34.0)
MCHC: 33 g/dL (ref 30.0–36.0)
MCV: 89.7 fL (ref 80.0–100.0)
Monocytes Absolute: 0.9 10*3/uL (ref 0.1–1.0)
Monocytes Relative: 14 %
Neutro Abs: 2.9 10*3/uL (ref 1.7–7.7)
Neutrophils Relative %: 48 %
Platelets: 162 10*3/uL (ref 150–400)
RBC: 5.13 MIL/uL (ref 4.22–5.81)
RDW: 15 % (ref 11.5–15.5)
WBC: 6 10*3/uL (ref 4.0–10.5)
nRBC: 0 % (ref 0.0–0.2)

## 2019-03-31 LAB — PROTIME-INR
INR: 1 (ref 0.8–1.2)
Prothrombin Time: 13.4 seconds (ref 11.4–15.2)

## 2019-03-31 MED ORDER — TRIAMCINOLONE ACETONIDE 40 MG/ML IJ SUSP
INTRAMUSCULAR | Status: AC
  Filled 2019-03-31: qty 1

## 2019-03-31 MED ORDER — SODIUM CHLORIDE 0.9% FLUSH: 10.0000 mL | Freq: Once | INTRAVENOUS | Status: DC

## 2019-03-31 MED ORDER — DEXAMETHASONE SODIUM PHOSPHATE 10 MG/ML IJ SOLN
INTRAMUSCULAR | Status: AC
  Filled 2019-03-31: qty 1

## 2019-03-31 MED ORDER — IOPAMIDOL (ISOVUE-M 200) INJECTION 41%: 20.0000 mL | Freq: Once | INTRAMUSCULAR | Status: DC | PRN

## 2019-03-31 MED ORDER — ROPIVACAINE HCL 2 MG/ML IJ SOLN
INTRAMUSCULAR | Status: AC
  Filled 2019-03-31: qty 10

## 2019-03-31 MED ORDER — TRIAMCINOLONE ACETONIDE 40 MG/ML IJ SUSP: 40.0000 mg | Freq: Once | INTRAMUSCULAR | Status: DC

## 2019-03-31 MED ORDER — IOHEXOL 180 MG/ML  SOLN
INTRAMUSCULAR | Status: AC
  Filled 2019-03-31: qty 20

## 2019-03-31 MED ORDER — LIDOCAINE HCL (PF) 1 % IJ SOLN: 5.0000 mL | Freq: Once | INTRAMUSCULAR | Status: DC

## 2019-03-31 MED ORDER — SODIUM CHLORIDE (PF) 0.9 % IJ SOLN
INTRAMUSCULAR | Status: AC
  Filled 2019-03-31: qty 10

## 2019-03-31 MED ORDER — ONDANSETRON HCL 4 MG/2ML IJ SOLN
4.0000 mg | INTRAMUSCULAR | Status: AC
  Administered 2019-03-31: 4 mg via INTRAVENOUS
  Filled 2019-03-31: qty 2

## 2019-03-31 MED ORDER — LIDOCAINE HCL 2 % IJ SOLN
INTRAMUSCULAR | Status: AC
  Filled 2019-03-31: qty 20

## 2019-03-31 MED ORDER — GLUCAGON HCL RDNA (DIAGNOSTIC) 1 MG IJ SOLR
1.0000 mg | Freq: Once | INTRAMUSCULAR | Status: AC
  Administered 2019-03-31: 05:00:00 1 mg via INTRAVENOUS
  Filled 2019-03-31: qty 1

## 2019-03-31 MED ORDER — ROPIVACAINE HCL 2 MG/ML IJ SOLN: 10.0000 mL | Freq: Once | INTRAMUSCULAR | Status: DC

## 2019-03-31 NOTE — Progress Notes (Signed)
Safety precautions to be maintained throughout the outpatient stay will include: orient to surroundings, keep bed in low position, maintain call bell within reach at all times, provide assistance with transfer out of bed and ambulation.  

## 2019-03-31 NOTE — ED Triage Notes (Signed)
Pt presents to ED with c/o of having a piece of beef stuck in his throat. Pt states he was eating around 1400 to 1500 yesterday and has not been able to tolerate swallowing anything. Pt states he vomits his saliva. Pt currently has no increased work of breathing noted at this time. Pt denies sob.

## 2019-03-31 NOTE — Discharge Instructions (Addendum)
Fortunately it seems that the food bolus that was blocking her esophagus passed after administration of glucagon 1 mg IV.  Please take your medications as scheduled and call the gastroenterologist at the included number to schedule a follow-up appointment.  You may benefit from an upper endoscopy to avoid similar issues in the future.  If you develop any new or worsening symptoms that concern you, please return to the emergency department. And avoid roast beef!

## 2019-03-31 NOTE — ED Provider Notes (Signed)
[facility] Emergency Department Provider Note  ____________________________________________   First MD Initiated Contact with Patient 03/31/19 [phone]     (approximate)  I have reviewed the triage vital signs and the nursing notes.   HISTORY  Chief Complaint Choking    HPI Thomas [name] is a 85 y.o. male with medical history as listed below which notably includes chronic A. fib on Eliquis and Tikosyn who reports no specific diagnosis of esophageal stricture but who says that every time he eats beef he has to be very careful or he feels like he gets stuck.  Approximately 3:00 this afternoon he reports that he was eating some roast beef and he thinks that he swallowed too big of a bite and says it has been stuck ever since then.  He has no pain but he has not been able to eat or drink anything since 3 PM.  He has tried multiple times to drink small amounts of carbonated beverage such as soda to help it go down and he said that eventually just builds up to the point that he has to spit or vomit the soda back up.  He has had no feeling of movement and says that when this happens he can usually tell when the food passes into his stomach but even though is not experiencing pain, he can tell nothing is moving down.  He is able to swallow his saliva but any amount of liquid other than his saliva will "build up" and eventually have to come back out through his mouth.  He was not able to rest due to his symptoms and he is concerned because he takes  medications regularly such as the Tikosyn that need to be on a schedule.  He denies chest pain, shortness of breath, contact with COVID-19 patients, fever/chills, abdominal pain.  He describes the symptoms as severe because of his inability to swallow and they occurred suddenly and he is absolutely certain It was due to roast beef and not due to any other foreign object.         Past Medical History:  Diagnosis Date  . Aortic  valve disorders   . Arthritis   . Atrial flutter ([place]) 06/18/2016   "AF or AFl; not sure which" (06/23/2016)  . Basal cell carcinoma    "face, nose left shoulder, left arm" (06/19/2016)  . BBB (bundle branch block)    hx right  . Chronic back pain    "neck, thoracic, lower back" (06/19/2016)  . Complete heart block ([place]) 06/2016  . Dyspnea   . GERD (gastroesophageal reflux disease)   . Heart block    "I've had type I, II [place] before now" (06/19/2016)  . History of gout   . History of hiatal hernia    "self dx'd" (06/19/2016)  . Hyperlipidemia   . Hypertension   . Lyme disease    "dx'd by me 2003; cx's showed dx 08/2015"  . Migraine    "3-4/year" (06/19/2016)  . Presence of permanent cardiac pacemaker 06/19/2016  . PVC's (premature ventricular contractions)   . Renal cancer, left ([place]) 2006   S/P cryotherapy  . Spinal stenosis    "cervical, 1 thoracic, lumbar" (06/19/2016)  . Squamous carcinoma    "face, nose left shoulder, left arm" (06/19/2016)  . Stroke ([place])   . TIA (transient ischemic attack) 06/14/2016   "I'm not sure that's what it was" (06/25/2016)  . Visit for monitoring Tikosyn therapy 09/09/2017    Patient Active  Problem List   Diagnosis Date Noted  . Persistent atrial fibrillation ([place]) 01/12/2019  . Acute low back pain 11/04/2017  . Abnormal screening cardiac CT   . Visit for monitoring Tikosyn therapy 09/09/2017  . History of stroke 08/29/2016  . Paroxysmal atrial fibrillation ([place]) 07/23/2016  . Coronary artery disease 07/23/2016  . Snores 06/27/2016  . Cardiac pacemaker in situ 06/25/2016  . Hemiparesis ([place]) 06/25/2016  . Acute ischemic stroke ([place])   . Acute left hemiparesis ([place])   . Hemisensory loss   . Dysarthria   . Stroke-like symptoms   . Complete heart block ([place])   . Pain in thoracic spine   . Orthostasis   . Lethargy   . Occlusion of vertebral artery   . TIA (transient ischemic attack) 06/14/2016  . Arthritis 02/06/2015  . Esophageal  reflux 02/06/2015  . Arthritis urica 02/06/2015  . Cannot sleep 02/06/2015  . Arthritis, degenerative 02/06/2015  . Adenocarcinoma, renal cell ([place]) 02/06/2015  . Benign prostatic hyperplasia with urinary obstruction 11/21/2014  . Personal history of other malignant neoplasm of kidney 11/21/2014  . Palpitations 12/31/2013  . Hyperlipidemia 11/02/2010  . HYPERTENSION, BENIGN 04/16/2010    Past Surgical History:  Procedure Laterality Date  . ANKLE FRACTURE SURGERY Right 1967  . BASAL CELL CARCINOMA EXCISION     "face, nose left shoulder, left arm"  . BIOPSY PROSTATE  2001 & 2003  . CARDIAC CATHETERIZATION  1990's  . CARDIOVERSION N/A 09/11/2017   Procedure: CARDIOVERSION;  Surgeon: Lelon Perla, MD;  Location: [facility] ENDOSCOPY;  Service: Cardiovascular;  Laterality: N/A;  . FRACTURE SURGERY    . INGUINAL HERNIA REPAIR Left 2012  . INSERT / REPLACE / REMOVE PACEMAKER  06/19/2016  . LAPAROSCOPIC ABLATION RENAL MASS    . LEFT HEART CATH AND CORONARY ANGIOGRAPHY Left 10/23/2017   Procedure: LEFT HEART CATH AND CORONARY ANGIOGRAPHY;  Surgeon: Wellington Hampshire, MD;  Location: [place] CV LAB;  Service: Cardiovascular;  Laterality: Left;  . PACEMAKER IMPLANT N/A 06/19/2016   Procedure: Pacemaker Implant;  Surgeon: Deboraha Sprang, MD;  Location: [place] CV LAB;  Service: Cardiovascular;  Laterality: N/A;  . pacemasker    . PROSTATE SURGERY    . SQUAMOUS CELL CARCINOMA EXCISION     "face, nose left shoulder, left arm"  . TONSILLECTOMY AND ADENOIDECTOMY      Prior to Admission medications   Medication Sig Start Date End Date Taking? Authorizing Provider  allopurinol (ZYLOPRIM) 100 MG tablet Take 2 tablets (200 mg total) by mouth daily. Patient taking differently: Take 100 mg by mouth daily.  03/04/18   Jerrol Banana., MD  allopurinol (ZYLOPRIM) 100 MG tablet Take 1 tablet (100 mg total) by mouth daily. 01/27/19   Jerrol Banana., MD  amLODipine (NORVASC) 5 MG tablet  TAKE 1 TABLET BY MOUTH DAILY 11/05/18   Jerrol Banana., MD  apixaban (ELIQUIS) 5 MG TABS tablet Take 1 tablet (5 mg total) by mouth 2 (two) times daily. 03/03/19   Jerrol Banana., MD  atorvastatin (LIPITOR) 40 MG tablet TAKE 1 TABLET BY MOUTH DAILY AT 6 PM 03/08/19   Wellington Hampshire, MD  cefdinir (OMNICEF) 300 MG capsule Take 1 capsule (300 mg total) by mouth daily. Patient not taking: Reported on 02/16/2019 05/11/18   Jerrol Banana., MD  Cholecalciferol (VITAMIN D3) 5000 units CAPS Take 5,000 Units by mouth daily.    [provider]  Coenzyme Q10 (COQ10) 100  MG CAPS Take 100 mg by mouth daily.    [provider]  colchicine (COLCRYS) 0.6 MG tablet Take 1 tablet (0.6 mg total) by mouth daily as needed (for gout). 02/17/18   Jerrol Banana., MD  diclofenac sodium (VOLTAREN) 1 % GEL Apply 2 g topically daily as needed (for pain).  01/20/14   [provider]  dofetilide (TIKOSYN) 125 MCG capsule Take 1 capsule (125 mcg total) by mouth 2 (two) times daily. 02/17/19   Deboraha Sprang, MD  doxycycline (VIBRA-TABS) 100 MG tablet Take 1 tablet (100 mg total) by mouth daily. Patient not taking: Reported on 02/16/2019 05/11/18   Jerrol Banana., MD  dutasteride (AVODART) 0.5 MG capsule Take 0.5 mg by mouth daily as needed (for urination).     [provider]  esomeprazole (NEXIUM) 40 MG capsule Take 40 mg by mouth daily as needed (for heartburn).     [provider]  hydrALAZINE (APRESOLINE) 25 MG tablet Take 1 tablet (25 mg total) by mouth 3 (three) times daily as needed. Patient taking differently: Take 25 mg by mouth 3 (three) times daily as needed (for BP > 150).  09/14/15   Minna Merritts, MD  metroNIDAZOLE (FLAGYL) 500 MG tablet TAKE 1 TABLET BY MOUTH TWICE DAILY ON SUNDAY AND MONDAY 01/05/19   Jerrol Banana., MD  Multiple Vitamins-Minerals (CENTRUM SILVER 50+MEN PO) Take 1 tablet by mouth daily.    [provider]  nebivolol (BYSTOLIC) 5 MG tablet Take 1 tablet (5 mg total) by mouth daily. 03/17/18   Deboraha Sprang, MD  NON FORMULARY Testosterone gel compound - apply 1 cc per day    [provider]  propranolol (INDERAL) 40 MG tablet TAKE ONE (1) TABLET THREE (3) TIMES EACHDAY AS NEEDED Patient taking differently: TAKE ONE (1) TABLET THREE (3) TIMES daily AS NEEDED for afib 09/17/16   Deboraha Sprang, MD  silodosin (RAPAFLO) 8 MG CAPS capsule Take 8 mg by mouth daily as needed (for urintation).     [provider]  temazepam (RESTORIL) 30 MG capsule temazepam 30 mg capsule 01/20/14   [provider]  terazosin (HYTRIN) 1 MG capsule Take 1 mg by mouth at bedtime.    [provider]  terbinafine (LAMISIL) 250 MG tablet Take 250 mg by mouth daily as needed.  01/15/19   [provider]  Testosterone 20.25 MG/ACT (1.62%) GEL Apply 1 pump to each shoulder daily 02/16/19   Zara Council A, PA-C  testosterone cypionate (DEPOTESTOSTERONE CYPIONATE) 200 MG/ML injection Inject 1 mL (200 mg total) into the muscle every 28 (twenty-eight) days. 10/23/17   Jerrol Banana., MD  traMADol (ULTRAM) 50 MG tablet Take 50 mg by mouth every 6 (six) hours as needed for moderate pain.  06/04/16   [provider]  Turmeric 500 MG CAPS Take 500 mg by mouth daily.    [provider]  vitamin E 400 UNIT capsule Take 400 Units by mouth daily.    [provider]    Allergies Iodine and Penicillins  Family History  Problem Relation Age of Onset  . Heart attack Brother   . Stroke Brother   . Aortic stenosis Mother   . Arthritis Father     Social History Social History   Tobacco Use  . Smoking status: Never Smoker  . Smokeless tobacco: Never Used  Substance Use Topics  . Alcohol use: Yes    Alcohol/week: 1.0 standard  drinks    Types: 1 Glasses of wine per week  . Drug use: No    Review of Systems Constitutional: No  fever/chills Eyes: No visual changes. ENT: Presumed food bolus impaction Cardiovascular: Denies chest pain. Respiratory: Denies shortness of breath. Gastrointestinal: No abdominal pain.  No nausea, but unable to swallow anything other than his own saliva. no vomiting except for when he tries to drink something to push down the food bolus.  No diarrhea.  No constipation. Genitourinary: Negative for dysuria. Musculoskeletal: Negative for neck pain.  Negative for back pain. Integumentary: Negative for rash. Neurological: Negative for headaches, focal weakness or numbness.   ____________________________________________   PHYSICAL EXAM:  VITAL SIGNS: ED Triage Vitals  Enc Vitals Group     BP 03/31/19 0429 (!) 146/90     Pulse Rate 03/31/19 0429 83     Resp 03/31/19 0429 18     Temp 03/31/19 0429 98.6 F (37 C)     Temp Source 03/31/19 0429 Oral     SpO2 03/31/19 0429 98 %     Weight 03/31/19 0423 72.6 kg (160 lb)     Height 03/31/19 0423 1.702 m (5\' 7" )     Head Circumference --      Peak Flow --      Pain Score 03/31/19 0423 0     Pain Loc --      Pain Edu? --      Excl. in [place]? --     Constitutional: Alert and oriented.  Patient does not appear to be in distress. Eyes: Conjunctivae are normal.  Head: Atraumatic. Nose: No congestion/rhinnorhea. Mouth/Throat: No drooling, no obvious obstruction within the oropharynx. Neck: No stridor.  No meningeal signs.  Normal voice. Cardiovascular: Normal rate, irregular rhythm. Good peripheral circulation. Grossly normal heart sounds. Respiratory: Normal respiratory effort.  No retractions. Gastrointestinal: Soft and nontender. No distention.  Musculoskeletal: No lower extremity tenderness nor edema. No gross deformities of extremities. Neurologic:  Normal speech and language. No gross focal neurologic deficits are appreciated.  Skin:  Skin is warm, dry and intact. Psychiatric: Mood and affect are normal. Speech and behavior are  normal.  ____________________________________________   LABS (all labs ordered are listed, but only abnormal results are displayed)  Labs Reviewed  CBC WITH DIFFERENTIAL/PLATELET  PROTIME-INR   ____________________________________________  EKG  ED ECG REPORT I, Hinda Kehr, the attending physician, personally viewed and interpreted this ECG.  Date: 03/31/2019 EKG Time: 5:00 AM Rate: 63 Rhythm: Sinus rhythm with first-degree AV block QRS Axis: normal Intervals: Interventricular conduction delay atypical right bundle branch block, prolonged QTC at 515 ms ST/T Wave abnormalities: Non-specific ST segment / T-wave changes, but no clear evidence of acute ischemia. Narrative Interpretation: no definitive evidence of acute ischemia; does not meet STEMI criteria.   ____________________________________________  RADIOLOGY I, Hinda Kehr, personally viewed and evaluated these images (plain radiographs) as part of my medical decision making, as well as reviewing the written report by the radiologist.  ED MD interpretation: No indication for emergent imaging  Official radiology report(s): No results found.  ____________________________________________   PROCEDURES   Procedure(s) performed (including Critical Care):  Procedures   ____________________________________________   INITIAL IMPRESSION / MDM / [place] / ED COURSE  As part of my medical decision making, I reviewed the following data within the [place] notes reviewed and incorporated, Labs reviewed , EKG interpreted , Old chart reviewed and Notes from prior ED visits   Differential diagnosis  includes, but is not limited to, food bolus impaction, esophageal stricture, esophageal web, achalasia.  Much less likely esophageal perforation.  Although the patient is comfortable and handling his own secretions, he has a very detailed and clear history that he is not able to swallow  any any additional fluids and gives a very good description of how it builds up in his esophagus and he has to spit or vomit it back out.  I suspect near complete blockage of his esophagus which is allowing passage of a saliva but no additional material.  I will check basic labs anticipating he may need endoscopy.  I will try medication treatment by premedicated with Zofran 4 mg IV and then given glucagon 1 mg IV to see if this will relax esophagus sufficiently to allow for the passage of the food bolus.  No indication for imaging as the patient gives a very clear history of the material being roast beef.  I explained to him that I understand the timing of the Tikosyn is important but that at best the endoscopy would happen later this morning.  He says that he understands.  Very low suspicion for esophageal perforation given no pain, no concern for free air, no crepitus on exam.       Clinical Course as of Mar 30 648  Wed Mar 31, 2019  0546 After getting the glucagon, the patient felt no specific change, but he is now drinking Diet Coke without any difficulty.  He is going to try some more diet Coke and then some saltines.  If this is able to stay down it indicates he is likely able to take his medications on schedule (930 for the Tikosyn) and will likely not need emergent endoscopy.   [CF]  [phone] The patient feels much better.  He has tolerated 2 cans of soda and 12 ounces of water.  He says he is ready to go home and does not want to try any food, he just wants to go home and take his medicine.  I think this is appropriate.  I gave my usual customary return precautions and recommended outpatient follow-up with GI.  I spoke with Dr. Bonna Gains who said she would be happy to see him in clinic.   [CF]  [phone] CBC and pro time INR are within normal limits.   [CF]    Clinical Course User Index [CF] Hinda Kehr, MD     ____________________________________________  FINAL CLINICAL IMPRESSION(S) / ED  DIAGNOSES  Final diagnoses:  Esophageal obstruction due to food impaction     MEDICATIONS GIVEN DURING THIS VISIT:  Medications  ondansetron (ZOFRAN) injection 4 mg (4 mg Intravenous Given 03/31/19 0511)  glucagon (human recombinant) (GLUCAGEN) injection 1 mg (1 mg Intravenous Given 03/31/19 [id number])     ED Discharge Orders    None      *Please note:  Thomas [name] was evaluated in Emergency Department on 03/31/2019 for the symptoms described in the history of present illness. He was evaluated in the context of the global COVID-19 pandemic, which necessitated consideration that the patient might be at risk for infection with the SARS-CoV-2 virus that causes COVID-19. Institutional protocols and algorithms that pertain to the evaluation of patients at risk for COVID-19 are in a state of rapid change based on information released by regulatory bodies including the CDC and federal and state organizations. These policies and algorithms were followed during the patient's care in the ED.  Some ED evaluations and interventions may be  delayed as a result of limited staffing during the pandemic.*  Note:  This document was prepared using Dragon voice recognition software and may include unintentional dictation errors.   Hinda Kehr, MD 03/31/19 [phone]

## 2019-03-31 NOTE — Progress Notes (Signed)
Subjective:  Patient ID: Thomas [name], male    DOB: December 12, 1932  Age: 85 y.o. MRN: [id number]  CC: Back Pain   Procedure: L3-4 epidural steroid and fluoroscopic guidance without sedation  HPI Thomas [name] presents for reevaluation.  He was last seen a few months ago and had an epidural steroid at L3-4 in September.  Thomas [name] got very good relief with that and this help with his right anterior thigh pain.  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  He also has some left arm numbness as well.  This is been a chronic issue but has been able to be off of blood thinners.  He is describing recurrence of a low back pain with radiation into the right anterior thigh and occasionally down of the right calf right side greater than left consistent with what he is experienced in the past.  In the past has had epidural steroid injections that have helped reduce the severity of the pain by 75 to 85% generally lasting 6 to 12 weeks.  Otherwise he is continuing to run with no change in lower extremity strength function or bowel or bladder function.  Outpatient Medications Prior to Visit  Medication Sig Dispense Refill  . allopurinol (ZYLOPRIM) 100 MG tablet Take 2 tablets (200 mg total) by mouth daily. (Patient taking differently: Take 100 mg by mouth daily. ) 180 tablet 1  . allopurinol (ZYLOPRIM) 100 MG tablet Take 1 tablet (100 mg total) by mouth daily. 30 tablet 0  . amLODipine (NORVASC) 5 MG tablet TAKE 1 TABLET BY MOUTH DAILY 180 tablet 4  . apixaban (ELIQUIS) 5 MG TABS tablet Take 1 tablet (5 mg total) by mouth 2 (two) times daily. 60 tablet 5  . atorvastatin (LIPITOR) 40 MG tablet TAKE 1 TABLET BY MOUTH DAILY AT 6 PM 30 tablet 5  . cefdinir (OMNICEF) 300 MG capsule Take 1 capsule (300 mg total) by mouth daily. 30 capsule 5  . Cholecalciferol (VITAMIN D3) 5000 units CAPS Take 5,000 Units by mouth daily.    . Coenzyme Q10 (COQ10) 100 MG CAPS Take 100  mg by mouth daily.    . colchicine (COLCRYS) 0.6 MG tablet Take 1 tablet (0.6 mg total) by mouth daily as needed (for gout). 30 tablet 5  . diclofenac sodium (VOLTAREN) 1 % GEL Apply 2 g topically daily as needed (for pain).     Marland Kitchen dofetilide (TIKOSYN) 125 MCG capsule Take 1 capsule (125 mcg total) by mouth 2 (two) times daily. 180 capsule 1  . doxycycline (VIBRA-TABS) 100 MG tablet Take 1 tablet (100 mg total) by mouth daily. 30 tablet 5  . dutasteride (AVODART) 0.5 MG capsule Take 0.5 mg by mouth daily as needed (for urination).     Marland Kitchen esomeprazole (NEXIUM) 40 MG capsule Take 40 mg by mouth daily as needed (for heartburn).     . hydrALAZINE (APRESOLINE) 25 MG tablet Take 1 tablet (25 mg total) by mouth 3 (three) times daily as needed. (Patient taking differently: Take 25 mg by mouth 3 (three) times daily as needed (for BP > 150). ) 90 tablet 6  . metroNIDAZOLE (FLAGYL) 500 MG tablet TAKE 1 TABLET BY MOUTH TWICE DAILY ON SUNDAY AND MONDAY 30 tablet 11  . Multiple Vitamins-Minerals (CENTRUM SILVER 50+MEN PO) Take 1 tablet by mouth daily.    . nebivolol (BYSTOLIC) 5 MG tablet Take 1 tablet (5 mg total) by mouth daily. [place]  tablet 3  . NON FORMULARY Testosterone gel compound - apply 1 cc per day    . propranolol (INDERAL) 40 MG tablet TAKE ONE (1) TABLET THREE (3) TIMES EACHDAY AS NEEDED (Patient taking differently: TAKE ONE (1) TABLET THREE (3) TIMES daily AS NEEDED for afib) 270 tablet 1  . silodosin (RAPAFLO) 8 MG CAPS capsule Take 8 mg by mouth daily as needed (for urintation).     . temazepam (RESTORIL) 30 MG capsule temazepam 30 mg capsule    . terazosin (HYTRIN) 1 MG capsule Take 1 mg by mouth at bedtime.    . terbinafine (LAMISIL) 250 MG tablet Take 250 mg by mouth daily as needed.     . Testosterone 20.25 MG/ACT (1.62%) GEL Apply 1 pump to each shoulder daily 75 g 2  . testosterone cypionate (DEPOTESTOSTERONE CYPIONATE) 200 MG/ML injection Inject 1 mL (200 mg total) into the muscle every 28  (twenty-eight) days. 10 mL 5  . traMADol (ULTRAM) 50 MG tablet Take 50 mg by mouth every 6 (six) hours as needed for moderate pain.     . Turmeric 500 MG CAPS Take 500 mg by mouth daily.    . vitamin E 400 UNIT capsule Take 400 Units by mouth daily.     No facility-administered medications prior to visit.     Review of Systems CNS: No confusion or sedation Cardiac: No angina or palpitations GI: No abdominal pain or constipation Constitutional: No nausea vomiting fevers or chills  Objective:  BP 105/76   Pulse 67   Temp 97.6 F (36.4 C)   Ht 5\' 7"  (1.702 m)   Wt 160 lb (72.6 kg)   SpO2 98%   BMI 25.06 kg/m    BP Readings from Last 3 Encounters:  03/31/19 105/76  03/31/19 (!) 146/90  02/16/19 (!) 142/80     Wt Readings from Last 3 Encounters:  03/31/19 160 lb (72.6 kg)  03/31/19 160 lb (72.6 kg)  02/16/19 164 lb 8 oz (74.6 kg)     Physical Exam Pt is alert and oriented PERRL EOMI HEART IS RRR no murmur or rub LCTA no wheezing or rales MUSCULOSKELETAL reveals a slightly antalgic gait.  He goes from sitting to standing without much difficulty.  He is ambulating per baseline.  His muscle tone and bulk to the lower extremities appears at baseline.  Labs  Lab Results  Component Value Date   HGBA1C 5.6 07/11/2016   HGBA1C 5.5 06/14/2016   Lab Results  Component Value Date   LDLCALC 74 02/24/2018   CREATININE 1.64 (H) 01/12/2019    -------------------------------------------------------------------------------------------------------------------- Lab Results  Component Value Date   WBC 6.0 03/31/2019   HGB 15.2 03/31/2019   HCT 46.0 03/31/2019   PLT 162 03/31/2019   GLUCOSE 98 01/12/2019   CHOL 141 02/24/2018   TRIG 39 02/24/2018   HDL 59 02/24/2018   LDLCALC 74 02/24/2018   ALT 18 02/24/2018   AST 23 02/24/2018   NA 137 01/12/2019   K 4.7 01/12/2019   CL 97 01/12/2019   CREATININE 1.64 (H) 01/12/2019   BUN 21 01/12/2019   CO2 26 01/12/2019   TSH  2.260 11/21/2017   PSA 5.8 08/22/2014   INR 1.0 03/31/2019   HGBA1C 5.6 07/11/2016    --------------------------------------------------------------------------------------------------------------------- No results found.   Assessment & Plan:   Kellam was seen today for back pain.  Diagnoses and all orders for this visit:  DDD (degenerative disc disease), lumbar  Spinal stenosis of lumbar region without  neurogenic claudication  Bilateral sciatica  Sciatica, right side  Sciatica of left side  Other orders -     triamcinolone acetonide (KENALOG-40) injection 40 mg -     sodium chloride flush (NS) 0.9 % injection 10 mL -     ropivacaine (PF) 2 mg/mL (0.2%) (NAROPIN) injection 10 mL -     lidocaine (PF) (XYLOCAINE) 1 % injection 5 mL -     iopamidol (ISOVUE-M) 41 % intrathecal injection 20 mL        ----------------------------------------------------------------------------------------------------------------------  Problem List Items Addressed This Visit    None    Visit Diagnoses    DDD (degenerative disc disease), lumbar    -  Primary   Relevant Medications   triamcinolone acetonide (KENALOG-40) injection 40 mg (Start on 03/31/2019 12:45 PM)   Spinal stenosis of lumbar region without neurogenic claudication       Bilateral sciatica       Sciatica, right side       Sciatica of left side            ----------------------------------------------------------------------------------------------------------------------  1. DDD (degenerative disc disease), lumbar As reviewed with [name] we will proceed with a lumbar epidural steroid today.  The risks and benefits of been reviewed previously.  All questions have been answered.  We will have him continue with physical therapy exercises with stretching strengthening.  We did talk about possible evaluation with a neurosurgeon for operative decompression if indicated depending on how he does with today's procedure and  in the near term.  2. Spinal stenosis of lumbar region without neurogenic claudication As above with return to clinic in 3 months.  3. Bilateral sciatica As above  4. Sciatica, right side   5. Sciatica of left side Continue stretching strengthening exercises    ----------------------------------------------------------------------------------------------------------------------  I am having Thomas [name] maintain his esomeprazole, Voltaren, hydrALAZINE, terazosin, traMADol, propranolol, dutasteride, silodosin, testosterone cypionate, Multiple Vitamins-Minerals (CENTRUM SILVER 50+MEN PO), CoQ10, vitamin E, Vitamin D3, Turmeric, NON FORMULARY, temazepam, colchicine, allopurinol, nebivolol, doxycycline, cefdinir, amLODipine, metroNIDAZOLE, allopurinol, terbinafine, Testosterone, dofetilide, apixaban, and atorvastatin.   Meds ordered this encounter  Medications  . triamcinolone acetonide (KENALOG-40) injection 40 mg  . sodium chloride flush (NS) 0.9 % injection 10 mL  . ropivacaine (PF) 2 mg/mL (0.2%) (NAROPIN) injection 10 mL  . lidocaine (PF) (XYLOCAINE) 1 % injection 5 mL  . iopamidol (ISOVUE-M) 41 % intrathecal injection 20 mL   Patient's Medications  New Prescriptions   No medications on file  Previous Medications   ALLOPURINOL (ZYLOPRIM) 100 MG TABLET    Take 2 tablets (200 mg total) by mouth daily.   ALLOPURINOL (ZYLOPRIM) 100 MG TABLET    Take 1 tablet (100 mg total) by mouth daily.   AMLODIPINE (NORVASC) 5 MG TABLET    TAKE 1 TABLET BY MOUTH DAILY   APIXABAN (ELIQUIS) 5 MG TABS TABLET    Take 1 tablet (5 mg total) by mouth 2 (two) times daily.   ATORVASTATIN (LIPITOR) 40 MG TABLET    TAKE 1 TABLET BY MOUTH DAILY AT 6 PM   CEFDINIR (OMNICEF) 300 MG CAPSULE    Take 1 capsule (300 mg total) by mouth daily.   CHOLECALCIFEROL (VITAMIN D3) 5000 UNITS CAPS    Take 5,000 Units by mouth daily.   COENZYME Q10 (COQ10) 100 MG CAPS    Take 100 mg by mouth daily.   COLCHICINE  (COLCRYS) 0.6 MG TABLET    Take 1 tablet (0.6 mg total) by mouth daily  as needed (for gout).   DICLOFENAC SODIUM (VOLTAREN) 1 % GEL    Apply 2 g topically daily as needed (for pain).    DOFETILIDE (TIKOSYN) 125 MCG CAPSULE    Take 1 capsule (125 mcg total) by mouth 2 (two) times daily.   DOXYCYCLINE (VIBRA-TABS) 100 MG TABLET    Take 1 tablet (100 mg total) by mouth daily.   DUTASTERIDE (AVODART) 0.5 MG CAPSULE    Take 0.5 mg by mouth daily as needed (for urination).    ESOMEPRAZOLE (NEXIUM) 40 MG CAPSULE    Take 40 mg by mouth daily as needed (for heartburn).    HYDRALAZINE (APRESOLINE) 25 MG TABLET    Take 1 tablet (25 mg total) by mouth 3 (three) times daily as needed.   METRONIDAZOLE (FLAGYL) 500 MG TABLET    TAKE 1 TABLET BY MOUTH TWICE DAILY ON SUNDAY AND MONDAY   MULTIPLE VITAMINS-MINERALS (CENTRUM SILVER 50+MEN PO)    Take 1 tablet by mouth daily.   NEBIVOLOL (BYSTOLIC) 5 MG TABLET    Take 1 tablet (5 mg total) by mouth daily.   NON FORMULARY    Testosterone gel compound - apply 1 cc per day   PROPRANOLOL (INDERAL) 40 MG TABLET    TAKE ONE (1) TABLET THREE (3) TIMES EACHDAY AS NEEDED   SILODOSIN (RAPAFLO) 8 MG CAPS CAPSULE    Take 8 mg by mouth daily as needed (for urintation).    TEMAZEPAM (RESTORIL) 30 MG CAPSULE    temazepam 30 mg capsule   TERAZOSIN (HYTRIN) 1 MG CAPSULE    Take 1 mg by mouth at bedtime.   TERBINAFINE (LAMISIL) 250 MG TABLET    Take 250 mg by mouth daily as needed.    TESTOSTERONE 20.25 MG/ACT (1.62%) GEL    Apply 1 pump to each shoulder daily   TESTOSTERONE CYPIONATE (DEPOTESTOSTERONE CYPIONATE) 200 MG/ML INJECTION    Inject 1 mL (200 mg total) into the muscle every 28 (twenty-eight) days.   TRAMADOL (ULTRAM) 50 MG TABLET    Take 50 mg by mouth every 6 (six) hours as needed for moderate pain.    TURMERIC 500 MG CAPS    Take 500 mg by mouth daily.   VITAMIN E 400 UNIT CAPSULE    Take 400 Units by mouth daily.  Modified Medications   No medications on file   Discontinued Medications   No medications on file   ----------------------------------------------------------------------------------------------------------------------  Follow-up: Return in about 3 months (around 07/01/2019) for procedure.   Procedure: L3-4 LESI with fluoroscopic guidance and no moderate sedation  NOTE: The risks, benefits, and expectations of the procedure have been discussed and explained to the patient who was understanding and in agreement with suggested treatment plan. No guarantees were made.  DESCRIPTION OF PROCEDURE: Lumbar epidural steroid injection with no IV Versed, EKG, blood pressure, pulse, and pulse oximetry monitoring. The procedure was performed with the patient in the prone position under fluoroscopic guidance.  Sterile prep x3 was initiated and I then injected subcutaneous lidocaine to the overlying L3-4 site after its fluoroscopic identifictation.  Using strict aseptic technique, I then advanced an 18-gauge Tuohy epidural needle in the midline using interlaminar approach via loss-of-resistance to saline technique. There was negative aspiration for heme or  CSF.  I then confirmed position with both AP and Lateral fluoroscan.  2 cc of contrast dye were injected and a  total of 5 mL of Preservative-Free normal saline mixed with 10 mg Decadron and 1cc Ropicaine 0.2 percent  were injected incrementally via the  epidurally placed needle. The needle was removed. The patient tolerated the injection well and was convalesced and discharged to home in stable condition. Should the patient have any post procedure difficulty they have been instructed on how to contact us for assistance.    Molli Barrows, MD

## 2019-03-31 NOTE — ED Notes (Signed)
Pt given coke to drink per MD Karma Greaser

## 2019-03-31 NOTE — Patient Instructions (Signed)
____________________________________________________________________________________________  Post-Procedure Discharge Instructions  Instructions:  Apply ice:   Purpose: This will minimize any swelling and discomfort after procedure.   When: Day of procedure, as soon as you get home.  How: Fill a plastic sandwich bag with crushed ice. Cover it with a small towel and apply to injection site.  How long: (15 min on, 15 min off) Apply for 15 minutes then remove x 15 minutes.  Repeat sequence on day of procedure, until you go to bed.  Apply heat:   Purpose: To treat any soreness and discomfort from the procedure.  When: Starting the next day after the procedure.  How: Apply heat to procedure site starting the day following the procedure.  How long: May continue to repeat daily, until discomfort goes away.  Food intake: Start with clear liquids (like water) and advance to regular food, as tolerated.   Physical activities: Keep activities to a minimum for the first 8 hours after the procedure. After that, then as tolerated.  Driving: If you have received any sedation, be responsible and do not drive. You are not allowed to drive for 24 hours after having sedation.  Blood thinner: (Applies only to those taking blood thinners) You may restart your blood thinner 6 hours after your procedure.  Insulin: (Applies only to Diabetic patients taking insulin) As soon as you can eat, you may resume your normal dosing schedule.  Infection prevention: Keep procedure site clean and dry. Shower daily and clean area with soap and water.  Post-procedure Pain Diary: Extremely important that this be done correctly and accurately. Recorded information will be used to determine the next step in treatment. For the purpose of accuracy, follow these rules:  Evaluate only the area treated. Do not report or include pain from an untreated area. For the purpose of this evaluation, ignore all other areas of pain,  except for the treated area.  After your procedure, avoid taking a long nap and attempting to complete the pain diary after you wake up. Instead, set your alarm clock to go off every hour, on the hour, for the initial 8 hours after the procedure. Document the duration of the numbing medicine, and the relief you are getting from it.  Do not go to sleep and attempt to complete it later. It will not be accurate. If you received sedation, it is likely that you were given a medication that may cause amnesia. Because of this, completing the diary at a later time may cause the information to be inaccurate. This information is needed to plan your care.  Follow-up appointment: Keep your post-procedure follow-up evaluation appointment after the procedure (usually 2 weeks for most procedures, 6 weeks for radiofrequencies). DO NOT FORGET to bring you pain diary with you.   Expect: (What should I expect to see with my procedure?)  From numbing medicine (AKA: Local Anesthetics): Numbness or decrease in pain. You may also experience some weakness, which if present, could last for the duration of the local anesthetic.  Onset: Full effect within 15 minutes of injected.  Duration: It will depend on the type of local anesthetic used. On the average, 1 to 8 hours.   From steroids (Applies only if steroids were used): Decrease in swelling or inflammation. Once inflammation is improved, relief of the pain will follow.  Onset of benefits: Depends on the amount of swelling present. The more swelling, the longer it will take for the benefits to be seen. In some cases, up to 10 days.    Duration: Steroids will stay in the system x 2 weeks. Duration of benefits will depend on multiple posibilities including persistent irritating factors.  Side-effects: If present, they may typically last 2 weeks (the duration of the steroids).  Frequent: Cramps (if they occur, drink Gatorade and take over-the-counter Magnesium 450-500 mg  once to twice a day); water retention with temporary weight gain; increases in blood sugar; decreased immune system response; increased appetite.  Occasional: Facial flushing (red, warm cheeks); mood swings; menstrual changes.  Uncommon: Long-term decrease or suppression of natural hormones; bone thinning. (These are more common with higher doses or more frequent use. This is why we prefer that our patients avoid having any injection therapies in other practices.)   Very Rare: Severe mood changes; psychosis; aseptic necrosis.  From procedure: Some discomfort is to be expected once the numbing medicine wears off. This should be minimal if ice and heat are applied as instructed.  Call if: (When should I call?)  You experience numbness and weakness that gets worse with time, as opposed to wearing off.  New onset bowel or bladder incontinence. (Applies only to procedures done in the spine)  Emergency Numbers:  Durning business hours (Monday - Thursday, 8:00 AM - 4:00 PM) (Friday, 9:00 AM - 12:00 Noon): [phone]  After hours: [phone]  NOTE: If you are having a problem and are unable connect with, or to talk to a provider, then go to your nearest urgent care or emergency department. If the problem is serious and urgent, please call 911. ____________________________________________________________________________________________   Epidural Steroid Injection An epidural steroid injection is a shot of steroid medicine and numbing medicine that is given into the space between the spinal cord and the bones in your back (epidural space). The shot helps relieve pain caused by an irritated or swollen nerve root. The amount of pain relief you get from the injection depends on what is causing the nerve to be swollen and irritated, and how long your pain lasts. You are more likely to benefit from this injection if your pain is strong and comes on suddenly rather than if you have had pain for  a long time. Tell a health care provider about:   Any allergies you have.  All medicines you are taking, including vitamins, herbs, eye drops, creams, and over-the-counter medicines.  Any problems you or family members have had with anesthetic medicines.  Any blood disorders you have.  Any surgeries you have had.  Any medical conditions you have.  Whether you are pregnant or may be pregnant. What are the risks? Generally, this is a safe procedure. However, problems may occur, including:  Headache.  Bleeding.  Infection.  Allergic reaction to medicines.  Damage to your nerves. What happens before the procedure? Staying hydrated Follow instructions from your health care provider about hydration, which may include:  Up to 2 hours before the procedure - you may continue to drink clear liquids, such as water, clear fruit juice, black coffee, and plain tea. Eating and drinking restrictions Follow instructions from your health care provider about eating and drinking, which may include:  8 hours before the procedure - stop eating heavy meals or foods such as meat, fried foods, or fatty foods.  6 hours before the procedure - stop eating light meals or foods, such as toast or cereal.  6 hours before the procedure - stop drinking milk or drinks that contain milk.  2 hours before the procedure - stop drinking clear liquids. Medicine  You may be given medicines to lower anxiety.  Ask your health care provider about: ? Changing or stopping your regular medicines. This is especially important if you are taking diabetes medicines or blood thinners. ? Taking medicines such as aspirin and ibuprofen. These medicines can thin your blood. Do not take these medicines before your procedure if your health care provider instructs you not to. General instructions  Plan to have someone take you home from the hospital or clinic. What happens during the procedure?  You may receive a  medicine to help you relax (sedative).  You will be asked to lie on your abdomen.  The injection site will be cleaned.  A numbing medicine (local anesthetic) will be used to numb the injection site.  A needle will be inserted through your skin into the epidural space. You may feel some discomfort when this happens. An X-ray machine will be used to make sure the needle is put as close as possible to the affected nerve.  A steroid medicine and a local anesthetic will be injected into the epidural space.  The needle will be removed.  A bandage (dressing) will be put over the injection site. What happens after the procedure?  Your blood pressure, heart rate, breathing rate, and blood oxygen level will be monitored until the medicines you were given have worn off.  Your arm or leg may feel weak or numb for a few hours.  The injection site may feel sore.  Do not drive for 24 hours if you received a sedative. This information is not intended to replace advice given to you by your health care provider. Make sure you discuss any questions you have with your health care provider. Document Released: 07/30/2007 Document Revised: 04/04/2017 Document Reviewed: 08/08/2015 Elsevier Patient Education  2020 [employer].

## 2019-04-27 ENCOUNTER — Other Ambulatory Visit: Payer: Self-pay | Admitting: Internal Medicine

## 2019-04-27 NOTE — Telephone Encounter (Signed)
*  STAT* If patient is at the pharmacy, call can be transferred to refill team.   1. Which medications need to be refilled? (please list name of each medication and dose if known) Metoprolol   2. Which pharmacy/location (including street and city if local pharmacy) is medication to be sent to? [facility]   3. Do they need a 30 day or 90 day supply? 90   Patient is out of meds per pharmacy please send asap so he doesn't miss a dose

## 2019-05-04 ENCOUNTER — Ambulatory Visit (INDEPENDENT_AMBULATORY_CARE_PROVIDER_SITE_OTHER): Payer: Medicare Other | Admitting: *Deleted

## 2019-05-04 DIAGNOSIS — I4819 Other persistent atrial fibrillation: Secondary | ICD-10-CM

## 2019-05-04 LAB — CUP PACEART REMOTE DEVICE CHECK
Battery Remaining Longevity: 95 mo
Battery Voltage: 2.99 V
Brady Statistic AP VP Percent: 87.4 %
Brady Statistic AP VS Percent: 0 %
Brady Statistic AS VP Percent: 12.58 %
Brady Statistic AS VS Percent: 0.01 %
Brady Statistic RA Percent Paced: 85.64 %
Brady Statistic RV Percent Paced: 99.99 %
Date Time Interrogation Session: 20201229010407
Implantable Lead Implant Date: 20180214
Implantable Lead Implant Date: 20180214
Implantable Lead Location: 753859
Implantable Lead Location: 753860
Implantable Lead Model: 5076
Implantable Lead Model: 5076
Implantable Pulse Generator Implant Date: 20180214
Lead Channel Impedance Value: 304 Ohm
Lead Channel Impedance Value: 361 Ohm
Lead Channel Impedance Value: 399 Ohm
Lead Channel Impedance Value: 418 Ohm
Lead Channel Pacing Threshold Amplitude: 0.5 V
Lead Channel Pacing Threshold Amplitude: 0.875 V
Lead Channel Pacing Threshold Pulse Width: 0.4 ms
Lead Channel Pacing Threshold Pulse Width: 0.4 ms
Lead Channel Sensing Intrinsic Amplitude: 13.375 mV
Lead Channel Sensing Intrinsic Amplitude: 13.375 mV
Lead Channel Sensing Intrinsic Amplitude: 4.375 mV
Lead Channel Sensing Intrinsic Amplitude: 4.375 mV
Lead Channel Setting Pacing Amplitude: 2 V
Lead Channel Setting Pacing Amplitude: 2.5 V
Lead Channel Setting Pacing Pulse Width: 0.4 ms
Lead Channel Setting Sensing Sensitivity: 0.9 mV

## 2019-05-11 MED ORDER — METOPROLOL SUCCINATE ER 50 MG PO TB24: 50.0000 mg | ORAL_TABLET | Freq: Every day | ORAL | 1 refills | Status: DC

## 2019-05-31 DIAGNOSIS — M25551 Pain in right hip: Secondary | ICD-10-CM | POA: Diagnosis not present

## 2019-05-31 DIAGNOSIS — G8929 Other chronic pain: Secondary | ICD-10-CM | POA: Diagnosis not present

## 2019-05-31 DIAGNOSIS — M1990 Unspecified osteoarthritis, unspecified site: Secondary | ICD-10-CM | POA: Diagnosis not present

## 2019-05-31 DIAGNOSIS — M79641 Pain in right hand: Secondary | ICD-10-CM | POA: Diagnosis not present

## 2019-05-31 DIAGNOSIS — M79642 Pain in left hand: Secondary | ICD-10-CM | POA: Diagnosis not present

## 2019-05-31 DIAGNOSIS — M8949 Other hypertrophic osteoarthropathy, multiple sites: Secondary | ICD-10-CM | POA: Diagnosis not present

## 2019-06-14 DIAGNOSIS — M1A00X0 Idiopathic chronic gout, unspecified site, without tophus (tophi): Secondary | ICD-10-CM | POA: Diagnosis not present

## 2019-06-15 NOTE — Progress Notes (Signed)
Subjective:   Thomas [name] is a 86 y.o. male who presents for Medicare Annual/Subsequent preventive examination.    This visit is being conducted through telemedicine due to the COVID-19 pandemic. This patient has given me verbal consent via doximity to conduct this visit, patient states they are participating from their home address. Some vital signs may be absent or patient reported.    Patient identification: identified by name, DOB, and current address  Review of Systems:  N/A  Cardiac Risk Factors include: advanced age (>41men, >46 women);dyslipidemia;hypertension;male gender     Objective:    Vitals: There were no vitals taken for this visit.  There is no height or weight on file to calculate BMI. Unable to obtain vitals due to visit being conducted via telephonically.   Advanced Directives 06/16/2019 03/31/2019 01/01/2018 11/27/2017 10/23/2017 10/21/2017 09/09/2017  Does Patient Have a Medical Advance Directive? Yes Yes Yes Yes Yes Yes Yes  Type of Paramedic of [place];Living will [place];Living will [place];Living will [place];Living will [place];Living will [place];Living will [place];Living will  Does patient want to make changes to medical advance directive? - - - - - - No - Patient declined  Copy of [place] in Chart? No - copy requested - - No - copy requested - - No - copy requested    Tobacco Social History   Tobacco Use  Smoking Status Never Smoker  Smokeless Tobacco Never Used     Counseling given: Not Answered   Clinical Intake:  Pre-visit preparation completed: Yes  Pain : No/denies pain Pain Score: 0-No pain     Nutritional Risks: None Diabetes: No  How often do you need to have someone help you when you read instructions, pamphlets, or other written materials from your doctor or  pharmacy?: 1 - Never  Interpreter Needed?: No  Information entered by :: [facility], LPN  Past Medical History:  Diagnosis Date  . Aortic valve disorders   . Arthritis   . Atrial flutter ([place]) 06/18/2016   "AF or AFl; not sure which" (06/23/2016)  . Basal cell carcinoma    "face, nose left shoulder, left arm" (06/19/2016)  . BBB (bundle branch block)    hx right  . Chronic back pain    "neck, thoracic, lower back" (06/19/2016)  . Complete heart block ([place]) 06/2016  . Dyspnea   . GERD (gastroesophageal reflux disease)   . Gout   . Heart block    "I've had type I, II [place] before now" (06/19/2016)  . History of gout   . History of hiatal hernia    "self dx'd" (06/19/2016)  . Hyperlipidemia   . Hypertension   . Lyme disease    "dx'd by me 2003; cx's showed dx 08/2015"  . Migraine    "3-4/year" (06/19/2016)  . Presence of permanent cardiac pacemaker 06/19/2016  . PVC's (premature ventricular contractions)   . Renal cancer, left ([place]) 2006   S/P cryotherapy  . Spinal stenosis    "cervical, 1 thoracic, lumbar" (06/19/2016)  . Squamous carcinoma    "face, nose left shoulder, left arm" (06/19/2016)  . Stroke ([place])   . TIA (transient ischemic attack) 06/14/2016   "I'm not sure that's what it was" (06/25/2016)  . Visit for monitoring Tikosyn therapy 09/09/2017   Past Surgical History:  Procedure Laterality Date  . ANKLE FRACTURE SURGERY Right 1967  . BASAL  CELL CARCINOMA EXCISION     "face, nose left shoulder, left arm"  . BIOPSY PROSTATE  2001 & 2003  . CARDIAC CATHETERIZATION  1990's  . CARDIOVERSION N/A 09/11/2017   Procedure: CARDIOVERSION;  Surgeon: Lelon Perla, MD;  Location: [facility] ENDOSCOPY;  Service: Cardiovascular;  Laterality: N/A;  . FRACTURE SURGERY    . INGUINAL HERNIA REPAIR Left 2012  . INSERT / REPLACE / REMOVE PACEMAKER  06/19/2016  . LAPAROSCOPIC ABLATION RENAL MASS    . LEFT HEART CATH AND CORONARY ANGIOGRAPHY Left 10/23/2017   Procedure: LEFT HEART CATH AND  CORONARY ANGIOGRAPHY;  Surgeon: Wellington Hampshire, MD;  Location: [facility] LAB;  Service: Cardiovascular;  Laterality: Left;  . PACEMAKER IMPLANT N/A 06/19/2016   Procedure: Pacemaker Implant;  Surgeon: Deboraha Sprang, MD;  Location: [place] [facility];  Service: Cardiovascular;  Laterality: N/A;  . pacemasker    . PROSTATE SURGERY    . SQUAMOUS CELL CARCINOMA EXCISION     "face, nose left shoulder, left arm"  . TONSILLECTOMY AND ADENOIDECTOMY     Family History  Problem Relation Age of Onset  . Heart attack Brother   . Stroke Brother   . Aortic stenosis Mother   . Arthritis Father    Social History   Socioeconomic History  . Marital status: Married    Spouse name: Not on file  . Number of children: 3  . Years of education: MD   . Highest education level: Master's degree (e.g., MA, MS, MEng, MEd, MSW, MBA)  Occupational History  . Occupation: Retired   Tobacco Use  . Smoking status: Never Smoker  . Smokeless tobacco: Never Used  Substance and Sexual Activity  . Alcohol use: Yes    Alcohol/week: 1.0 standard drinks    Types: 1 Glasses of wine per week  . Drug use: No  . Sexual activity: Yes  Other Topics Concern  . Not on file  Social History Narrative   Independent at baseline. Lives with family   Drinks tea in the morning    Social Determinants of Health   Financial Resource Strain: Low Risk   . Difficulty of Paying Living Expenses: Not hard at all  Food Insecurity: No Food Insecurity  . Worried About Charity fundraiser in the Last Year: Never true  . Ran Out of Food in the Last Year: Never true  Transportation Needs: No Transportation Needs  . Lack of Transportation (Medical): No  . Lack of Transportation (Non-Medical): No  Physical Activity: Sufficiently Active  . Days of Exercise per Week: 4 days  . Minutes of Exercise per Session: 50 min  Stress: No Stress Concern Present  . Feeling of Stress : Not at all  Social Connections: Slightly Isolated  .  Frequency of Communication with Friends and Family: Twice a week  . Frequency of Social Gatherings with Friends and Family: Twice a week  . Attends Religious Services: Never  . Active Member of Clubs or Organizations: Yes  . Attends Archivist Meetings: More than 4 times per year  . Marital Status: Married    Outpatient Encounter Medications as of 06/16/2019  Medication Sig  . allopurinol (ZYLOPRIM) 300 MG tablet Take 300 mg by mouth daily.  Marland Kitchen amLODipine (NORVASC) 5 MG tablet TAKE 1 TABLET BY MOUTH DAILY  . apixaban (ELIQUIS) 5 MG TABS tablet Take 1 tablet (5 mg total) by mouth 2 (two) times daily.  Marland Kitchen atorvastatin (LIPITOR) 40 MG tablet TAKE 1 TABLET  BY MOUTH DAILY AT 6 PM  . Cholecalciferol (VITAMIN D3) 5000 units CAPS Take 5,000 Units by mouth daily.  . Coenzyme Q10 (COQ10) 100 MG CAPS Take 100 mg by mouth daily.  . colchicine (COLCRYS) 0.6 MG tablet Take 1 tablet (0.6 mg total) by mouth daily as needed (for gout).  Marland Kitchen diclofenac sodium (VOLTAREN) 1 % GEL Apply 2 g topically daily as needed (for pain).   Marland Kitchen dofetilide (TIKOSYN) 125 MCG capsule Take 1 capsule (125 mcg total) by mouth 2 (two) times daily.  Marland Kitchen dutasteride (AVODART) 0.5 MG capsule Take 0.5 mg by mouth daily as needed (for urination).   Marland Kitchen esomeprazole (NEXIUM) 40 MG capsule Take 40 mg by mouth daily as needed (for heartburn).   . hydrALAZINE (APRESOLINE) 25 MG tablet Take 1 tablet (25 mg total) by mouth 3 (three) times daily as needed. (Patient taking differently: Take 25 mg by mouth 3 (three) times daily as needed (for BP > 150). )  . metoprolol succinate (TOPROL XL) 50 MG 24 hr tablet Take 1 tablet (50 mg total) by mouth daily. Take with or immediately following a meal.  . metroNIDAZOLE (FLAGYL) 500 MG tablet TAKE 1 TABLET BY MOUTH TWICE DAILY ON SUNDAY AND MONDAY  . Multiple Vitamins-Minerals (CENTRUM SILVER 50+MEN PO) Take 1 tablet by mouth daily.  . propranolol (INDERAL) 40 MG tablet TAKE ONE (1) TABLET THREE (3)  TIMES EACHDAY AS NEEDED (Patient taking differently: TAKE ONE (1) TABLET THREE (3) TIMES daily AS NEEDED for afib)  . silodosin (RAPAFLO) 8 MG CAPS capsule Take 8 mg by mouth daily as needed (for urintation).   . temazepam (RESTORIL) 30 MG capsule Take 30 mg by mouth at bedtime as needed.   . terazosin (HYTRIN) 1 MG capsule Take 1 mg by mouth at bedtime.  . terbinafine (LAMISIL) 250 MG tablet Take 250 mg by mouth daily as needed.   . Testosterone 20.25 MG/ACT (1.62%) GEL Apply 1 pump to each shoulder daily  . testosterone cypionate (DEPOTESTOSTERONE CYPIONATE) 200 MG/ML injection Inject 1 mL (200 mg total) into the muscle every 28 (twenty-eight) days.  . traMADol (ULTRAM) 50 MG tablet Take 50 mg by mouth every 6 (six) hours as needed for moderate pain.   . Turmeric 500 MG CAPS Take 500 mg by mouth daily.  . vitamin E 400 UNIT capsule Take 400 Units by mouth daily.  Marland Kitchen allopurinol (ZYLOPRIM) 100 MG tablet Take 2 tablets (200 mg total) by mouth daily. (Patient not taking: Reported on 06/16/2019)  . allopurinol (ZYLOPRIM) 100 MG tablet Take 1 tablet (100 mg total) by mouth daily. (Patient not taking: Reported on 06/16/2019)  . cefdinir (OMNICEF) 300 MG capsule Take 1 capsule (300 mg total) by mouth daily. (Patient not taking: Reported on 06/16/2019)  . doxycycline (VIBRA-TABS) 100 MG tablet Take 1 tablet (100 mg total) by mouth daily. (Patient not taking: Reported on 06/16/2019)  . NON FORMULARY Testosterone gel compound - apply 1 cc per day   No facility-administered encounter medications on file as of 06/16/2019.    Activities of Daily Living In your present state of health, do you have any difficulty performing the following activities: 06/16/2019  Hearing? N  Vision? N  Difficulty concentrating or making decisions? N  Walking or climbing stairs? N  Dressing or bathing? N  Doing errands, shopping? N  Preparing Food and eating ? N  Using the Toilet? N  In the past six months, have you accidently  leaked urine? Y  Comment Due  to enlarged prostate. Sees a urologist routinely.  Do you have problems with loss of bowel control? N  Managing your Medications? N  Managing your Finances? N  Housekeeping or managing your Housekeeping? N  Some recent data might be hidden    Patient Care Team: Jerrol Banana., MD as PCP - General (Family Medicine) Rockey Situ, Kathlene November, MD as Consulting Physician (Cardiology) Deboraha Sprang, MD as Consulting Physician (Cardiology) Leandrew Koyanagi, MD as Referring Physician (Ophthalmology) Wellington Hampshire, MD as Consulting Physician (Cardiology) Abbie Sons, MD (Urology) Emmaline Kluver., MD (Rheumatology) Ralene Bathe, MD (Dermatology)   Assessment:   This is a routine wellness examination for Jeovany.  Exercise Activities and Dietary recommendations Current Exercise Habits: Home exercise routine, Type of exercise: Other - see comments(running), Time (Minutes): 45(to 1 hour), Frequency (Times/Week): 5, Weekly Exercise (Minutes/Week): 225, Intensity: Moderate, Exercise limited by: None identified  Goals    . Increase water intake     Recommend increasing water intake to 7-8 glasses of water a day.       Fall Risk: Fall Risk  06/16/2019 03/31/2019 01/14/2019 07/08/2018 01/01/2018  Falls in the past year? 0 0 0 0 No  Number falls in past yr: 0 - - - -  Injury with Fall? 0 - - - -    FALL RISK PREVENTION PERTAINING TO THE HOME:  Any stairs in or around the home? Yes  If so, are there any without handrails? No   Home free of loose throw rugs in walkways, pet beds, electrical cords, etc? Yes  Adequate lighting in your home to reduce risk of falls? Yes   ASSISTIVE DEVICES UTILIZED TO PREVENT FALLS:  Life alert? No  Use of a cane, walker or w/c? No  Grab bars in the bathroom? No  Shower chair or bench in shower? No  Elevated toilet seat or a handicapped toilet? No   TIMED UP AND GO:  Was the test performed? No .     Depression Screen PHQ 2/9 Scores 06/16/2019 07/08/2018 01/01/2018 11/27/2017  PHQ - 2 Score 0 0 0 0    Cognitive Function: Declined today.     6CIT Screen 11/26/2016  What Year? 0 points  What month? 0 points  What time? 0 points  Count back from 20 0 points  Months in reverse 0 points  Repeat phrase 0 points  Total Score 0    Immunization History  Administered Date(s) Administered  . Fluad Quad(high Dose 65+) 02/04/2019  . Influenza, High Dose Seasonal PF 02/07/2015, 03/28/2018    Qualifies for Shingles Vaccine? Yes . Due for Shingrix. Pt has been advised to call insurance company to determine out of pocket expense. Advised may also receive vaccine at local pharmacy or Health Dept. Verbalized acceptance and understanding.  Tdap: Although this vaccine is not a covered service during a Wellness Exam, does the patient still wish to receive this vaccine today?  No . Advised may receive this vaccine at local pharmacy or Health Dept. Aware to provide a copy of the vaccination record if obtained from local pharmacy or Health Dept. Verbalized acceptance and understanding.  Flu Vaccine: Up to date  Pneumococcal Vaccine: Due for Pneumococcal vaccine. Does the patient want to receive this vaccine today?  No . Advised may receive this vaccine at local pharmacy or Health Dept. Aware to provide a copy of the vaccination record if obtained from local pharmacy or Health Dept. Verbalized acceptance and understanding.   Screening  Tests Health Maintenance  Topic Date Due  . PNA vac Low Risk Adult (1 of 2 - PCV13) 10/01/1999  . TETANUS/TDAP  06/15/2020 (Originally 09/30/1951)  . INFLUENZA VACCINE  Completed   Cancer Screenings:  Colorectal Screening: No longer required.   Lung Cancer Screening: (Low Dose CT Chest recommended if Age 87-80 years, 30 pack-year currently smoking OR have quit w/in 15years.) does not qualify.   Additional Screening:  Vision Screening: Recommended annual  ophthalmology exams for early detection of glaucoma and other disorders of the eye.  Dental Screening: Recommended annual dental exams for proper oral hygiene  Community Resource Referral:  CRR required this visit?  No        Plan:  I have personally reviewed and addressed the Medicare Annual Wellness questionnaire and have noted the following in the patient's chart:  A. Medical and social history B. Use of alcohol, tobacco or illicit drugs  C. Current medications and supplements D. Functional ability and status E.  Nutritional status F.  Physical activity G. Advance directives H. List of other physicians I.  Hospitalizations, surgeries, and ER visits in previous 12 months J.  [place] such as hearing and vision if needed, cognitive and depression L. Referrals and appointments   In addition, I have reviewed and discussed with patient certain preventive protocols, quality metrics, and best practice recommendations. A written personalized care plan for preventive services as well as general preventive health recommendations were provided to patient.   Glendora Score, [place]  [id number] Nurse Health Advisor   Nurse Notes: Pt would like to receive the Prevnar 13 vaccine at next in office apt.

## 2019-06-16 ENCOUNTER — Other Ambulatory Visit: Payer: Self-pay

## 2019-06-16 ENCOUNTER — Ambulatory Visit (INDEPENDENT_AMBULATORY_CARE_PROVIDER_SITE_OTHER): Payer: Medicare Other

## 2019-06-16 DIAGNOSIS — Z0000 Encounter for general adult medical examination without abnormal findings: Secondary | ICD-10-CM | POA: Diagnosis not present

## 2019-06-16 NOTE — Patient Instructions (Signed)
Thomas [name] , Thank you for taking time to come for your Medicare Wellness Visit. I appreciate your ongoing commitment to your health goals. Please review the following plan we discussed and let me know if I can assist you in the future.   Screening recommendations/referrals: Colonoscopy: No longer required.  Recommended yearly ophthalmology/optometry visit for glaucoma screening and checkup Recommended yearly dental visit for hygiene and checkup  Vaccinations: Influenza vaccine: Up to date Pneumococcal vaccine: Pt declines today.  Tdap vaccine: Pt declines today.  Shingles vaccine: Pt declines today.     Advanced directives: Please bring a copy of your POA (Power of Attorney) and/or Living Will to your next appointment.   Conditions/risks identified: Recommend to continue increasing water intake to 6-8 8 oz glasses a day.  Next appointment: 07/28/19 @ 10:20 AM with Dr Rosanna Randy. Declined scheduling an AWV for 2022 at this time.   Preventive Care 70 Years and Older, Male Preventive care refers to lifestyle choices and visits with your health care provider that can promote health and wellness. What does preventive care include?  A yearly physical exam. This is also called an annual well check.  Dental exams once or twice a year.  Routine eye exams. Ask your health care provider how often you should have your eyes checked.  Personal lifestyle choices, including:  Daily care of your teeth and gums.  Regular physical activity.  Eating a healthy diet.  Avoiding tobacco and drug use.  Limiting alcohol use.  Practicing safe sex.  Taking low doses of aspirin every day.  Taking vitamin and mineral supplements as recommended by your health care provider. What happens during an annual well check? The services and screenings done by your health care provider during your annual well check will depend on your age, overall health, lifestyle risk factors, and family history of  disease. Counseling  Your health care provider may ask you questions about your:  Alcohol use.  Tobacco use.  Drug use.  Emotional well-being.  Home and relationship well-being.  Sexual activity.  Eating habits.  History of falls.  Memory and ability to understand (cognition).  Work and work Statistician. Screening  You may have the following tests or measurements:  Height, weight, and BMI.  Blood pressure.  Lipid and cholesterol levels. These may be checked every 5 years, or more frequently if you are over 58 years old.  Skin check.  Lung cancer screening. You may have this screening every year starting at age 59 if you have a 30-pack-year history of smoking and currently smoke or have quit within the past 15 years.  Fecal occult blood test (FOBT) of the stool. You may have this test every year starting at age 72.  Flexible sigmoidoscopy or colonoscopy. You may have a sigmoidoscopy every 5 years or a colonoscopy every 10 years starting at age 70.  Prostate cancer screening. Recommendations will vary depending on your family history and other risks.  Hepatitis C blood test.  Hepatitis B blood test.  Sexually transmitted disease (STD) testing.  Diabetes screening. This is done by checking your blood sugar (glucose) after you have not eaten for a while (fasting). You may have this done every 1-3 years.  Abdominal aortic aneurysm (AAA) screening. You may need this if you are a current or former smoker.  Osteoporosis. You may be screened starting at age 61 if you are at high risk. Talk with your health care provider about your test results, treatment options, and if necessary, the need  for more tests. Vaccines  Your health care provider may recommend certain vaccines, such as:  Influenza vaccine. This is recommended every year.  Tetanus, diphtheria, and acellular pertussis (Tdap, Td) vaccine. You may need a Td booster every 10 years.  Zoster vaccine. You may  need this after age 56.  Pneumococcal 13-valent conjugate (PCV13) vaccine. One dose is recommended after age 61.  Pneumococcal polysaccharide (PPSV23) vaccine. One dose is recommended after age 67. Talk to your health care provider about which screenings and vaccines you need and how often you need them. This information is not intended to replace advice given to you by your health care provider. Make sure you discuss any questions you have with your health care provider. Document Released: 05/19/2015 Document Revised: 01/10/2016 Document Reviewed: 02/21/2015 Elsevier Interactive Patient Education  2017 Media Prevention in the Home Falls can cause injuries. They can happen to people of all ages. There are many things you can do to make your home safe and to help prevent falls. What can I do on the outside of my home?  Regularly fix the edges of walkways and driveways and fix any cracks.  Remove anything that might make you trip as you walk through a door, such as a raised step or threshold.  Trim any bushes or trees on the path to your home.  Use bright outdoor lighting.  Clear any walking paths of anything that might make someone trip, such as rocks or tools.  Regularly check to see if handrails are loose or broken. Make sure that both sides of any steps have handrails.  Any raised decks and porches should have guardrails on the edges.  Have any leaves, snow, or ice cleared regularly.  Use sand or salt on walking paths during winter.  Clean up any spills in your garage right away. This includes oil or grease spills. What can I do in the bathroom?  Use night lights.  Install grab bars by the toilet and in the tub and shower. Do not use towel bars as grab bars.  Use non-skid mats or decals in the tub or shower.  If you need to sit down in the shower, use a plastic, non-slip stool.  Keep the floor dry. Clean up any water that spills on the floor as soon as it  happens.  Remove soap buildup in the tub or shower regularly.  Attach bath mats securely with double-sided non-slip rug tape.  Do not have throw rugs and other things on the floor that can make you trip. What can I do in the bedroom?  Use night lights.  Make sure that you have a light by your bed that is easy to reach.  Do not use any sheets or blankets that are too big for your bed. They should not hang down onto the floor.  Have a firm chair that has side arms. You can use this for support while you get dressed.  Do not have throw rugs and other things on the floor that can make you trip. What can I do in the kitchen?  Clean up any spills right away.  Avoid walking on wet floors.  Keep items that you use a lot in easy-to-reach places.  If you need to reach something above you, use a strong step stool that has a grab bar.  Keep electrical cords out of the way.  Do not use floor polish or wax that makes floors slippery. If you must use wax, use  non-skid floor wax.  Do not have throw rugs and other things on the floor that can make you trip. What can I do with my stairs?  Do not leave any items on the stairs.  Make sure that there are handrails on both sides of the stairs and use them. Fix handrails that are broken or loose. Make sure that handrails are as long as the stairways.  Check any carpeting to make sure that it is firmly attached to the stairs. Fix any carpet that is loose or worn.  Avoid having throw rugs at the top or bottom of the stairs. If you do have throw rugs, attach them to the floor with carpet tape.  Make sure that you have a light switch at the top of the stairs and the bottom of the stairs. If you do not have them, ask someone to add them for you. What else can I do to help prevent falls?  Wear shoes that:  Do not have high heels.  Have rubber bottoms.  Are comfortable and fit you well.  Are closed at the toe. Do not wear sandals.  If you  use a stepladder:  Make sure that it is fully opened. Do not climb a closed stepladder.  Make sure that both sides of the stepladder are locked into place.  Ask someone to hold it for you, if possible.  Clearly mark and make sure that you can see:  Any grab bars or handrails.  First and last steps.  Where the edge of each step is.  Use tools that help you move around (mobility aids) if they are needed. These include:  Canes.  Walkers.  Scooters.  Crutches.  Turn on the lights when you go into a dark area. Replace any light bulbs as soon as they burn out.  Set up your furniture so you have a clear path. Avoid moving your furniture around.  If any of your floors are uneven, fix them.  If there are any pets around you, be aware of where they are.  Review your medicines with your doctor. Some medicines can make you feel dizzy. This can increase your chance of falling. Ask your doctor what other things that you can do to help prevent falls. This information is not intended to replace advice given to you by your health care provider. Make sure you discuss any questions you have with your health care provider. Document Released: 02/16/2009 Document Revised: 09/28/2015 Document Reviewed: 05/27/2014 Elsevier Interactive Patient Education  2017 [employer].

## 2019-06-24 ENCOUNTER — Other Ambulatory Visit: Payer: Self-pay

## 2019-06-24 NOTE — Patient Outreach (Signed)
[facility]) Care Management  06/24/2019  Thomas [name] August 09, 1932 [id number]   Medication Adherence call to Thomas [name] HIPPA Compliant Voice message left with a call back number. Thomas [name] is showing past due on Atorvastatin 40 mg under [place].   [place] Management Direct Dial [phone]  Fax [phone] [name]

## 2019-07-10 DIAGNOSIS — H0011 Chalazion right upper eyelid: Secondary | ICD-10-CM | POA: Diagnosis not present

## 2019-07-12 DIAGNOSIS — M1A00X0 Idiopathic chronic gout, unspecified site, without tophus (tophi): Secondary | ICD-10-CM | POA: Diagnosis not present

## 2019-07-12 DIAGNOSIS — Z79899 Other long term (current) drug therapy: Secondary | ICD-10-CM | POA: Diagnosis not present

## 2019-07-14 ENCOUNTER — Other Ambulatory Visit: Payer: Self-pay

## 2019-07-14 NOTE — Patient Outreach (Signed)
[facility]) Care Management  07/14/2019  TADEO HARMELINK [birth year] [id number]   Medication Adherence call to Mr. Owenn Laffin HIPPA Compliant Voice message left with a call back number. Mr. Luginbill is showing past due on Atorvastatin 40 mg under [place].   [place] Management Direct Dial [phone]  Fax [phone] [email or web address]

## 2019-07-21 ENCOUNTER — Other Ambulatory Visit: Payer: Self-pay | Admitting: Internal Medicine

## 2019-07-23 DIAGNOSIS — H353131 Nonexudative age-related macular degeneration, bilateral, early dry stage: Secondary | ICD-10-CM | POA: Diagnosis not present

## 2019-07-27 NOTE — Progress Notes (Deleted)
Patient: Thomas [name], Male    DOB: 07-09-1932, 86 y.o.   MRN: [id number] Visit Date: 07/28/2019  Today's Provider: [name], MD   No chief complaint on file.  Subjective:     Patient had AWV with [facility] 06/16/2019.   Complete Physical Thomas [name] is a 86 y.o. male. He feels {DESC; WELL/FAIRLY WELL/POORLY:18703}. He reports exercising ***. He reports he is sleeping {DESC; WELL/FAIRLY WELL/POORLY:18703}.  -----------------------------------------------------------   Review of Systems  Social History   Socioeconomic History  . Marital status: Married    Spouse name: Not on file  . Number of children: 3  . Years of education: MD   . Highest education level: Master's degree (e.g., MA, MS, MEng, MEd, MSW, MBA)  Occupational History  . Occupation: Retired   Tobacco Use  . Smoking status: Never Smoker  . Smokeless tobacco: Never Used  Substance and Sexual Activity  . Alcohol use: Yes    Alcohol/week: 1.0 standard drinks    Types: 1 Glasses of wine per week  . Drug use: No  . Sexual activity: Yes  Other Topics Concern  . Not on file  Social History Narrative   Independent at baseline. Lives with family   Drinks tea in the morning    Social Determinants of Health   Financial Resource Strain: Low Risk   . Difficulty of Paying Living Expenses: Not hard at all  Food Insecurity: No Food Insecurity  . Worried About Charity fundraiser in the Last Year: Never true  . Ran Out of Food in the Last Year: Never true  Transportation Needs: No Transportation Needs  . Lack of Transportation (Medical): No  . Lack of Transportation (Non-Medical): No  Physical Activity: Sufficiently Active  . Days of Exercise per Week: 4 days  . Minutes of Exercise per Session: 50 min  Stress: No Stress Concern Present  . Feeling of Stress : Not at all  Social Connections: Slightly Isolated  . Frequency of Communication with Friends and Family: Twice a week  . Frequency of Social  Gatherings with Friends and Family: Twice a week  . Attends Religious Services: Never  . Active Member of Clubs or Organizations: Yes  . Attends Archivist Meetings: More than 4 times per year  . Marital Status: Married  Human resources officer Violence: Not At Risk  . Fear of Current or Ex-Partner: No  . Emotionally Abused: No  . Physically Abused: No  . Sexually Abused: No    Past Medical History:  Diagnosis Date  . Aortic valve disorders   . Arthritis   . Atrial flutter ([place]) 06/18/2016   "AF or AFl; not sure which" (06/23/2016)  . Basal cell carcinoma    "face, nose left shoulder, left arm" (06/19/2016)  . BBB (bundle branch block)    hx right  . Chronic back pain    "neck, thoracic, lower back" (06/19/2016)  . Complete heart block ([place]) 06/2016  . Dyspnea   . GERD (gastroesophageal reflux disease)   . Gout   . Heart block    "I've had type I, II [place] before now" (06/19/2016)  . History of gout   . History of hiatal hernia    "self dx'd" (06/19/2016)  . Hyperlipidemia   . Hypertension   . Lyme disease    "dx'd by me 2003; cx's showed dx 08/2015"  . Migraine    "3-4/year" (06/19/2016)  . Presence of permanent cardiac pacemaker 06/19/2016  .  PVC's (premature ventricular contractions)   . Renal cancer, left ([place]) 2006   S/P cryotherapy  . Spinal stenosis    "cervical, 1 thoracic, lumbar" (06/19/2016)  . Squamous carcinoma    "face, nose left shoulder, left arm" (06/19/2016)  . Stroke ([place])   . TIA (transient ischemic attack) 06/14/2016   "I'm not sure that's what it was" (06/25/2016)  . Visit for monitoring Tikosyn therapy 09/09/2017     Patient Active Problem List   Diagnosis Date Noted  . Persistent atrial fibrillation ([place]) 01/12/2019  . Acute low back pain 11/04/2017  . Abnormal screening cardiac CT   . Visit for monitoring Tikosyn therapy 09/09/2017  . History of stroke 08/29/2016  . Paroxysmal atrial fibrillation ([place]) 07/23/2016  . Coronary artery  disease 07/23/2016  . Snores 06/27/2016  . Cardiac pacemaker in situ 06/25/2016  . Hemiparesis ([place]) 06/25/2016  . Acute ischemic stroke ([place])   . Acute left hemiparesis ([place])   . Hemisensory loss   . Dysarthria   . Stroke-like symptoms   . Complete heart block ([place])   . Pain in thoracic spine   . Orthostasis   . Lethargy   . Occlusion of vertebral artery   . TIA (transient ischemic attack) 06/14/2016  . Arthritis 02/06/2015  . Esophageal reflux 02/06/2015  . Arthritis urica 02/06/2015  . Cannot sleep 02/06/2015  . Arthritis, degenerative 02/06/2015  . Adenocarcinoma, renal cell ([place]) 02/06/2015  . Benign prostatic hyperplasia with urinary obstruction 11/21/2014  . Personal history of other malignant neoplasm of kidney 11/21/2014  . Palpitations 12/31/2013  . Hyperlipidemia 11/02/2010  . HYPERTENSION, BENIGN 04/16/2010    Past Surgical History:  Procedure Laterality Date  . ANKLE FRACTURE SURGERY Right 1967  . BASAL CELL CARCINOMA EXCISION     "face, nose left shoulder, left arm"  . BIOPSY PROSTATE  2001 & 2003  . CARDIAC CATHETERIZATION  1990's  . CARDIOVERSION N/A 09/11/2017   Procedure: CARDIOVERSION;  Surgeon: Lelon Perla, MD;  Location: [facility] ENDOSCOPY;  Service: Cardiovascular;  Laterality: N/A;  . FRACTURE SURGERY    . INGUINAL HERNIA REPAIR Left 2012  . INSERT / REPLACE / REMOVE PACEMAKER  06/19/2016  . LAPAROSCOPIC ABLATION RENAL MASS    . LEFT HEART CATH AND CORONARY ANGIOGRAPHY Left 10/23/2017   Procedure: LEFT HEART CATH AND CORONARY ANGIOGRAPHY;  Surgeon: Wellington Hampshire, MD;  Location: [facility] CV LAB;  Service: Cardiovascular;  Laterality: Left;  . PACEMAKER IMPLANT N/A 06/19/2016   Procedure: Pacemaker Implant;  Surgeon: Deboraha Sprang, MD;  Location: [place] CV LAB;  Service: Cardiovascular;  Laterality: N/A;  . pacemasker    . PROSTATE SURGERY    . SQUAMOUS CELL CARCINOMA EXCISION     "face, nose left shoulder, left arm"  . TONSILLECTOMY  AND ADENOIDECTOMY      His family history includes Aortic stenosis in his mother; Arthritis in his father; Heart attack in his brother; Stroke in his brother.   Current Outpatient Medications:  .  allopurinol (ZYLOPRIM) 100 MG tablet, Take 2 tablets (200 mg total) by mouth daily. (Patient not taking: Reported on 06/16/2019), Disp: 180 tablet, Rfl: 1 .  allopurinol (ZYLOPRIM) 100 MG tablet, Take 1 tablet (100 mg total) by mouth daily. (Patient not taking: Reported on 06/16/2019), Disp: 30 tablet, Rfl: 0 .  allopurinol (ZYLOPRIM) 300 MG tablet, Take 300 mg by mouth daily., Disp: , Rfl:  .  amLODipine (NORVASC) 5 MG tablet, TAKE 1 TABLET BY MOUTH DAILY, Disp: 180 tablet,  Rfl: 4 .  apixaban (ELIQUIS) 5 MG TABS tablet, Take 1 tablet (5 mg total) by mouth 2 (two) times daily., Disp: 60 tablet, Rfl: 5 .  atorvastatin (LIPITOR) 40 MG tablet, TAKE 1 TABLET BY MOUTH DAILY AT 6 PM, Disp: 30 tablet, Rfl: 5 .  cefdinir (OMNICEF) 300 MG capsule, Take 1 capsule (300 mg total) by mouth daily. (Patient not taking: Reported on 06/16/2019), Disp: 30 capsule, Rfl: 5 .  Cholecalciferol (VITAMIN D3) 5000 units CAPS, Take 5,000 Units by mouth daily., Disp: , Rfl:  .  Coenzyme Q10 (COQ10) 100 MG CAPS, Take 100 mg by mouth daily., Disp: , Rfl:  .  colchicine (COLCRYS) 0.6 MG tablet, Take 1 tablet (0.6 mg total) by mouth daily as needed (for gout)., Disp: 30 tablet, Rfl: 5 .  diclofenac sodium (VOLTAREN) 1 % GEL, Apply 2 g topically daily as needed (for pain). , Disp: , Rfl:  .  dofetilide (TIKOSYN) 125 MCG capsule, TAKE 1 CAPSULE BY MOUTH 2 TIMES DAILY, Disp: 180 capsule, Rfl: 1 .  doxycycline (VIBRA-TABS) 100 MG tablet, Take 1 tablet (100 mg total) by mouth daily. (Patient not taking: Reported on 06/16/2019), Disp: 30 tablet, Rfl: 5 .  dutasteride (AVODART) 0.5 MG capsule, Take 0.5 mg by mouth daily as needed (for urination). , Disp: , Rfl:  .  esomeprazole (NEXIUM) 40 MG capsule, Take 40 mg by mouth daily as needed (for  heartburn). , Disp: , Rfl:  .  hydrALAZINE (APRESOLINE) 25 MG tablet, Take 1 tablet (25 mg total) by mouth 3 (three) times daily as needed. (Patient taking differently: Take 25 mg by mouth 3 (three) times daily as needed (for BP > 150). ), Disp: 90 tablet, Rfl: 6 .  metoprolol succinate (TOPROL XL) 50 MG 24 hr tablet, Take 1 tablet (50 mg total) by mouth daily. Take with or immediately following a meal., Disp: 90 tablet, Rfl: 1 .  metroNIDAZOLE (FLAGYL) 500 MG tablet, TAKE 1 TABLET BY MOUTH TWICE DAILY ON SUNDAY AND MONDAY, Disp: 30 tablet, Rfl: 11 .  Multiple Vitamins-Minerals (CENTRUM SILVER 50+MEN PO), Take 1 tablet by mouth daily., Disp: , Rfl:  .  NON FORMULARY, Testosterone gel compound - apply 1 cc per day, Disp: , Rfl:  .  propranolol (INDERAL) 40 MG tablet, TAKE ONE (1) TABLET THREE (3) TIMES EACHDAY AS NEEDED (Patient taking differently: TAKE ONE (1) TABLET THREE (3) TIMES daily AS NEEDED for afib), Disp: 270 tablet, Rfl: 1 .  silodosin (RAPAFLO) 8 MG CAPS capsule, Take 8 mg by mouth daily as needed (for urintation). , Disp: , Rfl:  .  temazepam (RESTORIL) 30 MG capsule, Take 30 mg by mouth at bedtime as needed. , Disp: , Rfl:  .  terazosin (HYTRIN) 1 MG capsule, Take 1 mg by mouth at bedtime., Disp: , Rfl:  .  terbinafine (LAMISIL) 250 MG tablet, Take 250 mg by mouth daily as needed. , Disp: , Rfl:  .  Testosterone 20.25 MG/ACT (1.62%) GEL, Apply 1 pump to each shoulder daily, Disp: 75 g, Rfl: 2 .  testosterone cypionate (DEPOTESTOSTERONE CYPIONATE) 200 MG/ML injection, Inject 1 mL (200 mg total) into the muscle every 28 (twenty-eight) days., Disp: 10 mL, Rfl: 5 .  traMADol (ULTRAM) 50 MG tablet, Take 50 mg by mouth every 6 (six) hours as needed for moderate pain. , Disp: , Rfl:  .  Turmeric 500 MG CAPS, Take 500 mg by mouth daily., Disp: , Rfl:  .  vitamin E 400 UNIT capsule, Take  400 Units by mouth daily., Disp: , Rfl:   Patient Care Team: Jerrol Banana., MD as PCP - General  (Family Medicine) Minna Merritts, MD as Consulting Physician (Cardiology) Deboraha Sprang, MD as Consulting Physician (Cardiology) Leandrew Koyanagi, MD as Referring Physician (Ophthalmology) Wellington Hampshire, MD as Consulting Physician (Cardiology) Abbie Sons, MD (Urology) Emmaline Kluver., MD (Rheumatology) Ralene Bathe, MD (Dermatology)     Objective:    Vitals: There were no vitals taken for this visit.  Physical Exam  Activities of Daily Living In your present state of health, do you have any difficulty performing the following activities: 06/16/2019  Hearing? N  Vision? N  Difficulty concentrating or making decisions? N  Walking or climbing stairs? N  Dressing or bathing? N  Doing errands, shopping? N  Preparing Food and eating ? N  Using the Toilet? N  In the past six months, have you accidently leaked urine? Y  Comment Due to enlarged prostate. Sees a urologist routinely.  Do you have problems with loss of bowel control? N  Managing your Medications? N  Managing your Finances? N  Housekeeping or managing your Housekeeping? N  Some recent data might be hidden    Fall Risk Assessment Fall Risk  06/16/2019 03/31/2019 01/14/2019 07/08/2018 01/01/2018  Falls in the past year? 0 0 0 0 No  Number falls in past yr: 0 - - - -  Injury with Fall? 0 - - - -     Depression Screen PHQ 2/9 Scores 06/16/2019 07/08/2018 01/01/2018 11/27/2017  PHQ - 2 Score 0 0 0 0    6CIT Screen 11/26/2016  What Year? 0 points  What month? 0 points  What time? 0 points  Count back from 20 0 points  Months in reverse 0 points  Repeat phrase 0 points  Total Score 0       Assessment & Plan:    Annual Physical Reviewed patient's Family Medical History Reviewed and updated list of patient's medical providers Assessment of cognitive impairment was done Assessed patient's functional ability Established a written schedule for health screening [place] Completed and Reviewed  Exercise Activities and Dietary recommendations Goals    . Increase water intake     Recommend increasing water intake to 7-8 glasses of water a day.       Immunization History  Administered Date(s) Administered  . Fluad Quad(high Dose 65+) 02/04/2019  . Influenza, High Dose Seasonal PF 02/07/2015, 03/28/2018    Health Maintenance  Topic Date Due  . PNA vac Low Risk Adult (1 of 2 - PCV13) Never done  . TETANUS/TDAP  06/15/2020 (Originally 09/30/1951)  . INFLUENZA VACCINE  Completed     Discussed health benefits of physical activity, and encouraged him to engage in regular exercise appropriate for his age and condition.    ------------------------------------------------------------------------------------------------------------    [name], MD  [place]

## 2019-07-28 ENCOUNTER — Encounter: Payer: Medicare Other | Admitting: Family Medicine

## 2019-07-30 DIAGNOSIS — H0012 Chalazion right lower eyelid: Secondary | ICD-10-CM | POA: Diagnosis not present

## 2019-08-03 ENCOUNTER — Ambulatory Visit (INDEPENDENT_AMBULATORY_CARE_PROVIDER_SITE_OTHER): Payer: Medicare Other | Admitting: *Deleted

## 2019-08-03 DIAGNOSIS — I442 Atrioventricular block, complete: Secondary | ICD-10-CM | POA: Diagnosis not present

## 2019-08-03 LAB — CUP PACEART REMOTE DEVICE CHECK
Battery Remaining Longevity: 91 mo
Battery Voltage: 2.98 V
Brady Statistic AP VP Percent: 86.83 %
Brady Statistic AP VS Percent: 0 %
Brady Statistic AS VP Percent: 13.15 %
Brady Statistic AS VS Percent: 0.01 %
Brady Statistic RA Percent Paced: 84.8 %
Brady Statistic RV Percent Paced: 99.98 %
Date Time Interrogation Session: 20210330020352
Implantable Lead Implant Date: 20180214
Implantable Lead Implant Date: 20180214
Implantable Lead Location: 753859
Implantable Lead Location: 753860
Implantable Lead Model: 5076
Implantable Lead Model: 5076
Implantable Pulse Generator Implant Date: 20180214
Lead Channel Impedance Value: 323 Ohm
Lead Channel Impedance Value: 342 Ohm
Lead Channel Impedance Value: 399 Ohm
Lead Channel Impedance Value: 418 Ohm
Lead Channel Pacing Threshold Amplitude: 0.5 V
Lead Channel Pacing Threshold Amplitude: 0.875 V
Lead Channel Pacing Threshold Pulse Width: 0.4 ms
Lead Channel Pacing Threshold Pulse Width: 0.4 ms
Lead Channel Sensing Intrinsic Amplitude: 13.375 mV
Lead Channel Sensing Intrinsic Amplitude: 13.375 mV
Lead Channel Sensing Intrinsic Amplitude: 3.125 mV
Lead Channel Sensing Intrinsic Amplitude: 3.125 mV
Lead Channel Setting Pacing Amplitude: 2 V
Lead Channel Setting Pacing Amplitude: 2.5 V
Lead Channel Setting Pacing Pulse Width: 0.4 ms
Lead Channel Setting Sensing Sensitivity: 0.9 mV

## 2019-08-03 NOTE — Progress Notes (Signed)
PPM Remote  

## 2019-08-16 DIAGNOSIS — H0012 Chalazion right lower eyelid: Secondary | ICD-10-CM | POA: Diagnosis not present

## 2019-08-18 DIAGNOSIS — M1A00X0 Idiopathic chronic gout, unspecified site, without tophus (tophi): Secondary | ICD-10-CM | POA: Diagnosis not present

## 2019-08-31 ENCOUNTER — Other Ambulatory Visit: Payer: Self-pay | Admitting: Urology

## 2019-08-31 DIAGNOSIS — E291 Testicular hypofunction: Secondary | ICD-10-CM

## 2019-09-01 ENCOUNTER — Telehealth: Payer: Self-pay | Admitting: Urology

## 2019-09-01 ENCOUNTER — Other Ambulatory Visit: Payer: Medicare Other

## 2019-09-01 ENCOUNTER — Other Ambulatory Visit: Payer: Self-pay

## 2019-09-01 DIAGNOSIS — N138 Other obstructive and reflux uropathy: Secondary | ICD-10-CM | POA: Diagnosis not present

## 2019-09-01 NOTE — Telephone Encounter (Signed)
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  Dr. Bernardo Heater would like for him to have a PSA, testosterone, HCT and hgb prior to refill.

## 2019-09-01 NOTE — Telephone Encounter (Signed)
He is coming today for lab work

## 2019-09-02 LAB — PSA: Prostate Specific Ag, Serum: 6.5 ng/mL — ABNORMAL HIGH (ref 0.0–4.0)

## 2019-09-02 LAB — TESTOSTERONE: Testosterone: >1500 ng/dL — ABNORMAL HIGH (ref 264–916)

## 2019-09-02 LAB — HEMATOCRIT: Hematocrit: 48.3 % (ref 37.5–51.0)

## 2019-09-03 ENCOUNTER — Telehealth: Payer: Self-pay | Admitting: Urology

## 2019-09-03 NOTE — Telephone Encounter (Signed)
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 Bernardo Heater has reviewed his labs and wants him to stop the testosterone gel for one week and then restart it at one pump daily instead of two due to his testosterone level returning >1500.  His PSA has also increased from 5.5 to 6.5 over the last year.  Dr. Bernardo Heater would like to have an office visit with him in one month to repeat his labs and have further discussion.   I did leave a MyChart message regarding this issue as well.

## 2019-09-03 NOTE — Telephone Encounter (Signed)
Patient to stop his injection once a month until he follow up in the office.

## 2019-09-03 NOTE — Telephone Encounter (Signed)
Notified patient as instructed, patient pleased. Discussed follow-up appointments, patient agrees  

## 2019-09-15 ENCOUNTER — Other Ambulatory Visit: Payer: Self-pay | Admitting: Family Medicine

## 2019-09-15 MED ORDER — METOPROLOL SUCCINATE ER 50 MG PO TB24: 50.0000 mg | ORAL_TABLET | Freq: Every day | ORAL | 0 refills | Status: DC

## 2019-09-15 NOTE — Telephone Encounter (Signed)
Medication Refill - Medication: metoprolol succinate (TOPROL XL) 50 MG 24 hr tablet amLODipine (NORVASC) 5 MG tablet  Pt's wife called and explained that they need an emergency supply sent to pharmacy listed below. They are not able to return home due to gas shortage, please advise. A week supply at minimum.   Has the patient contacted their pharmacy? Yes.   (Agent: If no, request that the patient contact the pharmacy for the refill.) (Agent: If yes, when and what did the pharmacy advise?)  Preferred Pharmacy (with phone number or street name):  [facility]  [place] [address]  Phone: [phone] Fax: [phone]     Agent: Please be advised that RX refills may take up to 3 business days. We ask that you follow-up with your pharmacy.

## 2019-09-15 NOTE — Telephone Encounter (Signed)
Requested medication (s) are due for refill today:   No  Requested medication (s) are on the active medication list:   Yes  Future visit scheduled:   No   Last ordered: 05/11/2019  #90  1 refill  Clinic note:  Requesting a emergency supply be sent to the pharmacy listed.  Due to the gas shortage they are not able to get home.  Requesting a week minimum refill.   (I changed to the correct pharmacy on list [facility] [phone].  Returned for provider review.   Requested Prescriptions  Pending Prescriptions Disp Refills   metoprolol succinate (TOPROL XL) 50 MG 24 hr tablet 90 tablet 1    Sig: Take 1 tablet (50 mg total) by mouth daily. Take with or immediately following a meal.      Cardiovascular:  Beta Blockers Failed - 09/15/2019  3:14 PM      Failed - Last BP in normal range    BP Readings from Last 1 Encounters:  03/31/19 (!) 152/92          Failed - Valid encounter within last 6 months    Recent Outpatient Visits           1 year ago Chest pain, unspecified type   [facility] Jerrol Banana., MD   1 year ago Gout, unspecified cause, unspecified chronicity, unspecified site   [facility] Jerrol Banana., MD   1 year ago Atrial fibrillation, unspecified type [facility])   [facility] Jerrol Banana., MD   2 years ago Shortness of breath   [facility] Jerrol Banana., MD   3 years ago Hospital discharge follow-up   [facility] Jerrol Banana., MD       Future Appointments             In 3 weeks Stoioff, Ronda Fairly, MD [facility]            Passed - Last Heart Rate in normal range    Pulse Readings from Last 1 Encounters:  03/31/19 77

## 2019-09-18 ENCOUNTER — Encounter: Payer: Self-pay | Admitting: Family Medicine

## 2019-09-20 ENCOUNTER — Other Ambulatory Visit: Payer: Self-pay | Admitting: *Deleted

## 2019-09-20 MED ORDER — AMLODIPINE BESYLATE 5 MG PO TABS: 5.0000 mg | ORAL_TABLET | Freq: Every day | ORAL | 0 refills | Status: DC

## 2019-09-20 NOTE — Telephone Encounter (Signed)
[place]

## 2019-09-30 ENCOUNTER — Other Ambulatory Visit: Payer: Self-pay | Admitting: Family Medicine

## 2019-09-30 DIAGNOSIS — E291 Testicular hypofunction: Secondary | ICD-10-CM

## 2019-10-05 ENCOUNTER — Other Ambulatory Visit: Payer: Self-pay

## 2019-10-05 ENCOUNTER — Other Ambulatory Visit: Payer: Medicare Other

## 2019-10-05 DIAGNOSIS — E291 Testicular hypofunction: Secondary | ICD-10-CM

## 2019-10-06 LAB — TESTOSTERONE: Testosterone: 236 ng/dL — ABNORMAL LOW (ref 264–916)

## 2019-10-08 ENCOUNTER — Other Ambulatory Visit: Payer: Self-pay

## 2019-10-08 ENCOUNTER — Ambulatory Visit: Payer: Medicare Other | Admitting: Urology

## 2019-10-08 ENCOUNTER — Encounter: Payer: Self-pay | Admitting: Urology

## 2019-10-08 VITALS — BP 122/76 | HR 83 | Ht 67.0 in | Wt 160.0 lb

## 2019-10-08 DIAGNOSIS — N401 Enlarged prostate with lower urinary tract symptoms: Secondary | ICD-10-CM | POA: Diagnosis not present

## 2019-10-08 DIAGNOSIS — E291 Testicular hypofunction: Secondary | ICD-10-CM | POA: Diagnosis not present

## 2019-10-08 MED ORDER — TESTOSTERONE 1.62 % TD GEL: TRANSDERMAL | 3 refills | Status: DC

## 2019-10-08 MED ORDER — TESTOSTERONE CYPIONATE 200 MG/ML IM SOLN: 200.0000 mg | INTRAMUSCULAR | 0 refills | Status: DC

## 2019-10-08 NOTE — Progress Notes (Signed)
10/08/2019 3:23 PM   Thomas [name] [birth year] [id number]  Referring provider: Jerrol Banana., MD [address] [place],  [place] [id number]  Chief Complaint  Patient presents with   Hypogonadism    Urologic history: 1.  Elevated PSA -3 prior prostate biopsies with benign pathology; PSA in 6 range -Prostate volume 50 g  2.  BPH with LUTS -Prior microwave thermotherapy -On silodosin and dutasteride  3.  Hypogonadism -On TRT  4.  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 -He states the blood work drawn in April was approximately 10 days after the testosterone injection. -Repeat testosterone level 6/1 was low at 236 ng/dL  -History significant BPH on silodosin, Terazosin and dutasteride    PMH: Past Medical History:  Diagnosis Date   Aortic valve disorders    Arthritis    Atrial flutter ([place]) 06/18/2016   "AF or AFl; not sure which" (06/23/2016)   Basal cell carcinoma    "face, nose left shoulder, left arm" (06/19/2016)   BBB (bundle branch block)    hx right   Chronic back pain    "neck, thoracic, lower back" (06/19/2016)   Complete heart block ([place]) 06/2016   Dyspnea    GERD (gastroesophageal reflux disease)    Gout    Heart block    "I've had type I, II [place] before now" (06/19/2016)   History of gout    History of  hiatal hernia    "self dx'd" (06/19/2016)   Hyperlipidemia    Hypertension    Lyme disease    "dx'd by me 2003; cx's showed dx 08/2015"   Migraine    "3-4/year" (06/19/2016)   Presence of permanent cardiac pacemaker 06/19/2016   PVC's (premature ventricular contractions)    Renal cancer, left ([place]) 2006   S/P cryotherapy   Spinal stenosis    "cervical, 1 thoracic, lumbar" (06/19/2016)   Squamous carcinoma    "face, nose left shoulder, left arm" (06/19/2016)   Stroke ([place])    TIA (transient ischemic attack) 06/14/2016   "I'm not sure that's what it was" (06/25/2016)   Visit for monitoring Tikosyn therapy 09/09/2017    Surgical History: Past Surgical History:  Procedure Laterality Date   ANKLE FRACTURE SURGERY Right [place]     "face, nose left shoulder, left arm"   BIOPSY PROSTATE  2001 & 2003   CARDIAC CATHETERIZATION  1990's   CARDIOVERSION N/A 09/11/2017   Procedure: CARDIOVERSION;  Surgeon: Lelon Perla, MD;  Location: [facility];  Service: Cardiovascular;  Laterality: N/A;   FRACTURE SURGERY     INGUINAL HERNIA REPAIR Left 2012   INSERT / REPLACE / REMOVE PACEMAKER  06/19/2016   LAPAROSCOPIC ABLATION RENAL MASS     LEFT HEART CATH AND CORONARY ANGIOGRAPHY Left 10/23/2017   Procedure: LEFT HEART CATH AND CORONARY ANGIOGRAPHY;  Surgeon: Wellington Hampshire, MD;  Location: [facility];  Service: Cardiovascular;  Laterality: Left;   PACEMAKER IMPLANT N/A 06/19/2016   Procedure: Pacemaker Implant;  Surgeon: Deboraha Sprang, MD;  Location: [facility];  Service: Cardiovascular;  Laterality: N/A;   pacemasker     PROSTATE SURGERY     SQUAMOUS CELL CARCINOMA EXCISION     "face, nose left shoulder, left arm"   TONSILLECTOMY AND ADENOIDECTOMY      Home Medications:  Allergies as of 10/08/2019      Reactions   Iodine Hives, Other (See Comments)   IVP contrast   Penicillins Itching, Rash, Other (See Comments)     ITCHY FEELING IN FINGERS Has patient had a PCN reaction causing immediate rash, facial/tongue/throat swelling, SOB or lightheadedness with hypotension: No Has patient had a PCN reaction causing severe rash involving mucus membranes or skin necrosis: No Has patient had a PCN reaction that required hospitalization No Has patient had a PCN reaction occurring within the last 10 years: No If all of the above answers are "NO", then may proceed with Cephalosporin use.      Medication List       Accurate as of October 08, 2019  3:23 PM. If you have any questions, ask your nurse or doctor.        allopurinol 100 MG tablet Commonly known as: ZYLOPRIM Take 2 tablets (200 mg total) by mouth daily.   allopurinol 100 MG tablet Commonly known as: ZYLOPRIM Take 1 tablet (100 mg total) by mouth daily.   allopurinol 300 MG tablet Commonly known as: ZYLOPRIM Take 300 mg by mouth daily.   amLODipine 5 MG tablet Commonly known as: NORVASC Take 1 tablet (5 mg total) by mouth daily.   apixaban 5 MG Tabs tablet Commonly known as: Eliquis Take 1 tablet (5 mg total) by mouth 2 (two) times daily.   atorvastatin 40 MG tablet Commonly known as: LIPITOR TAKE 1 TABLET BY MOUTH DAILY AT 6 PM   cefdinir 300 MG capsule Commonly known as: OMNICEF Take 1 capsule (300 mg total) by mouth daily.   CENTRUM SILVER 50+MEN PO Take 1 tablet by mouth daily.   colchicine 0.6 MG tablet Commonly known as: Colcrys Take 1 tablet (0.6 mg total) by mouth daily as needed (for gout).   CoQ10 100 MG Caps Take 100 mg by mouth daily.   dofetilide 125 MCG capsule Commonly known as: TIKOSYN TAKE 1 CAPSULE BY MOUTH 2 TIMES DAILY   doxycycline 100 MG tablet Commonly known as: VIBRA-TABS Take 1 tablet (100 mg total) by mouth daily.   dutasteride 0.5 MG capsule Commonly known as: AVODART Take 0.5 mg by mouth daily as needed (for urination).   esomeprazole 40 MG capsule Commonly known as: NEXIUM Take 40 mg by  mouth daily as needed (for heartburn).   hydrALAZINE 25 MG tablet Commonly known as: APRESOLINE Take 1 tablet (25 mg total) by mouth 3 (three) times daily as needed. What changed: reasons to take this   metoprolol succinate 50 MG 24 hr tablet Commonly known as: Toprol XL Take 1 tablet (50 mg total) by mouth daily. Take with or immediately following a meal. Please schedule office visit before any future refills.   metroNIDAZOLE 500 MG tablet Commonly known as: FLAGYL TAKE 1 TABLET BY MOUTH TWICE DAILY ON SUNDAY AND MONDAY   NON FORMULARY Testosterone gel compound - apply 1 cc per day   propranolol 40 MG tablet Commonly known as: INDERAL TAKE ONE (1) TABLET THREE (  3) TIMES EACHDAY AS NEEDED What changed: See the new instructions.   Rapaflo 8 MG Caps capsule Generic drug: silodosin Take 8 mg by mouth daily as needed (for urintation).   temazepam 30 MG capsule Commonly known as: RESTORIL Take 30 mg by mouth at bedtime as needed.   terazosin 1 MG capsule Commonly known as: HYTRIN Take 1 mg by mouth at bedtime.   terbinafine 250 MG tablet Commonly known as: LAMISIL Take 250 mg by mouth daily as needed.   Testosterone 1.62 % Gel APPLY 1 PUMP ONLY DAILY   testosterone cypionate 200 MG/ML injection Commonly known as: DEPOTESTOSTERONE CYPIONATE Inject 1 mL (200 mg total) into the muscle every 28 (twenty-eight) days.   traMADol 50 MG tablet Commonly known as: ULTRAM Take 50 mg by mouth every 6 (six) hours as needed for moderate pain.   Turmeric 500 MG Caps Take 500 mg by mouth daily.   Vitamin D3 125 MCG (5000 UT) Caps Take 5,000 Units by mouth daily.   vitamin E 180 MG (400 UNITS) capsule Take 400 Units by mouth daily.   Voltaren 1 % Gel Generic drug: diclofenac Sodium Apply 2 g topically daily as needed (for pain).       Allergies:  Allergies  Allergen Reactions   Iodine Hives and Other (See Comments)    IVP contrast   Penicillins Itching, Rash and  Other (See Comments)    ITCHY FEELING IN FINGERS Has patient had a PCN reaction causing immediate rash, facial/tongue/throat swelling, SOB or lightheadedness with hypotension: No Has patient had a PCN reaction causing severe rash involving mucus membranes or skin necrosis: No Has patient had a PCN reaction that required hospitalization No Has patient had a PCN reaction occurring within the last 10 years: No If all of the above answers are "NO", then may proceed with Cephalosporin use.     Family History: Family History  Problem Relation Age of Onset   Heart attack Brother    Stroke Brother    Aortic stenosis Mother    Arthritis Father     Social History:  reports that he has never smoked. He has never used smokeless tobacco. He reports current alcohol use of about 1.0 standard drinks of alcohol per week. He reports that he does not use drugs.   Physical Exam: BP 122/76    Pulse 83    Ht 5\' 7"  (1.702 m)    Wt 160 lb (72.6 kg)    BMI 25.06 kg/m   Constitutional:  Alert and oriented, No acute distress. HEENT: [place] AT, moist mucus membranes.  Trachea midline, no masses. Cardiovascular: No clubbing, cyanosis, or edema. Respiratory: Normal respiratory effort, no increased work of breathing. Neurologic: Grossly intact, no focal deficits, moving all 4 extremities. Psychiatric: Normal mood and affect.   Assessment & Plan:    1.  Hypogonadism -Current testosterone level is abnormal on 1 application testosterone gel daily -We will have him go back to full dose testosterone gel at 2 pumps daily -May restart the testosterone injections based on his symptoms but would recommend testosterone levels every 6 months  2.  BPH with LUTS -Stable   Abbie Sons, MD  [facility] [address] [place], [place] [address] [phone]

## 2019-10-10 ENCOUNTER — Encounter: Payer: Self-pay | Admitting: Urology

## 2019-10-10 DIAGNOSIS — E291 Testicular hypofunction: Secondary | ICD-10-CM | POA: Insufficient documentation

## 2019-10-11 ENCOUNTER — Encounter: Payer: Self-pay | Admitting: Anesthesiology

## 2019-10-11 ENCOUNTER — Other Ambulatory Visit: Payer: Self-pay | Admitting: Anesthesiology

## 2019-10-11 ENCOUNTER — Other Ambulatory Visit: Payer: Self-pay

## 2019-10-11 ENCOUNTER — Ambulatory Visit
Admission: RE | Admit: 2019-10-11 | Discharge: 2019-10-11 | Disposition: A | Discharge status: Home / Self Care | Payer: Medicare Other | Source: Ambulatory Visit | Attending: Anesthesiology | Admitting: Anesthesiology

## 2019-10-11 ENCOUNTER — Ambulatory Visit (HOSPITAL_BASED_OUTPATIENT_CLINIC_OR_DEPARTMENT_OTHER): Payer: Medicare Other | Admitting: Anesthesiology

## 2019-10-11 VITALS — BP 165/94 | HR 60 | Temp 97.3°F | Resp 16 | Ht 67.0 in | Wt 160.0 lb

## 2019-10-11 DIAGNOSIS — M5431 Sciatica, right side: Secondary | ICD-10-CM

## 2019-10-11 DIAGNOSIS — M5136 Other intervertebral disc degeneration, lumbar region: Secondary | ICD-10-CM | POA: Diagnosis not present

## 2019-10-11 DIAGNOSIS — R52 Pain, unspecified: Secondary | ICD-10-CM | POA: Diagnosis not present

## 2019-10-11 DIAGNOSIS — M5432 Sciatica, left side: Secondary | ICD-10-CM

## 2019-10-11 DIAGNOSIS — M48061 Spinal stenosis, lumbar region without neurogenic claudication: Secondary | ICD-10-CM

## 2019-10-11 MED ORDER — TRIAMCINOLONE ACETONIDE 40 MG/ML IJ SUSP
40.0000 mg | Freq: Once | INTRAMUSCULAR | Status: AC
  Administered 2019-10-11: 40 mg

## 2019-10-11 MED ORDER — LIDOCAINE HCL (PF) 1 % IJ SOLN
5.0000 mL | Freq: Once | INTRAMUSCULAR | Status: AC
  Administered 2019-10-11: 5 mL via SUBCUTANEOUS

## 2019-10-11 MED ORDER — SODIUM CHLORIDE 0.9% FLUSH
10.0000 mL | Freq: Once | INTRAVENOUS | Status: AC
  Administered 2019-10-11: 10 mL

## 2019-10-11 MED ORDER — IOHEXOL 180 MG/ML  SOLN: 10.0000 mL | Freq: Once | INTRAMUSCULAR | Status: DC | PRN

## 2019-10-11 MED ORDER — TRIAMCINOLONE ACETONIDE 40 MG/ML IJ SUSP
INTRAMUSCULAR | Status: AC
  Filled 2019-10-11: qty 1

## 2019-10-11 MED ORDER — ROPIVACAINE HCL 2 MG/ML IJ SOLN
10.0000 mL | Freq: Once | INTRAMUSCULAR | Status: AC
  Administered 2019-10-11: 10 mL via EPIDURAL

## 2019-10-11 MED ORDER — LIDOCAINE HCL (PF) 1 % IJ SOLN
INTRAMUSCULAR | Status: AC
  Filled 2019-10-11: qty 10

## 2019-10-11 MED ORDER — SODIUM CHLORIDE (PF) 0.9 % IJ SOLN
INTRAMUSCULAR | Status: AC
  Filled 2019-10-11: qty 10

## 2019-10-11 MED ORDER — ROPIVACAINE HCL 2 MG/ML IJ SOLN
INTRAMUSCULAR | Status: AC
  Filled 2019-10-11: qty 10

## 2019-10-11 NOTE — Patient Instructions (Signed)
Pain Management Discharge Instructions  General Discharge Instructions :  If you need to reach your doctor call: Monday-Friday 8:00 am - 4:00 pm at [phone] or toll free [phone].  After clinic hours [phone] to have operator reach doctor.  Bring all of your medication bottles to all your appointments in the pain clinic.  To cancel or reschedule your appointment with Pain Management please remember to call 24 hours in advance to avoid a fee.  Refer to the educational materials which you have been given on: General Risks, I had my Procedure. Discharge Instructions, Post Sedation.  Post Procedure Instructions:  The drugs you were given will stay in your system until tomorrow, so for the next 24 hours you should not drive, make any legal decisions or drink any alcoholic beverages.  You may eat anything you prefer, but it is better to start with liquids then soups and crackers, and gradually work up to solid foods.  Please notify your doctor immediately if you have any unusual bleeding, trouble breathing or pain that is not related to your normal pain.  Depending on the type of procedure that was done, some parts of your body may feel week and/or numb.  This usually clears up by tonight or the next day.  Walk with the use of an assistive device or accompanied by an adult for the 24 hours.  You may use ice on the affected area for the first 24 hours.  Put ice in a Ziploc bag and cover with a towel and place against area 15 minutes on 15 minutes off.  You may switch to heat after 24 hours.Epidural Steroid Injection Patient Information  Description: The epidural space surrounds the nerves as they exit the spinal cord.  In some patients, the nerves can be compressed and inflamed by a bulging disc or a tight spinal canal (spinal stenosis).  By injecting steroids into the epidural space, we can bring irritated nerves into direct contact with a potentially helpful medication.  These  steroids act directly on the irritated nerves and can reduce swelling and inflammation which often leads to decreased pain.  Epidural steroids may be injected anywhere along the spine and from the neck to the low back depending upon the location of your pain.   After numbing the skin with local anesthetic (like Novocaine), a small needle is passed into the epidural space slowly.  You may experience a sensation of pressure while this is being done.  The entire block usually last less than 10 minutes.  Conditions which may be treated by epidural steroids:   Low back and leg pain  Neck and arm pain  Spinal stenosis  Post-laminectomy syndrome  Herpes zoster (shingles) pain  Pain from compression fractures  Preparation for the injection:  1. Do not eat any solid food or dairy products within 8 hours of your appointment.  2. You may drink clear liquids up to 3 hours before appointment.  Clear liquids include water, black coffee, juice or soda.  No milk or cream please. 3. You may take your regular medication, including pain medications, with a sip of water before your appointment  Diabetics should hold regular insulin (if taken separately) and take 1/2 normal NPH dos the morning of the procedure.  Carry some sugar containing items with you to your appointment. 4. A driver must accompany you and be prepared to drive you home after your procedure.  5. Bring all your current medications with your. 6. An IV may be inserted and   sedation may be given at the discretion of the physician.   7. A blood pressure cuff, EKG and other monitors will often be applied during the procedure.  Some patients may need to have extra oxygen administered for a short period. 8. You will be asked to provide medical information, including your allergies, prior to the procedure.  We must know immediately if you are taking blood thinners (like Coumadin/Warfarin)  Or if you are allergic to IV iodine contrast (dye). We must  know if you could possible be pregnant.  Possible side-effects:  Bleeding from needle site  Infection (rare, may require surgery)  Nerve injury (rare)  Numbness & tingling (temporary)  Difficulty urinating (rare, temporary)  Spinal headache ( a headache worse with upright posture)  Light -headedness (temporary)  Pain at injection site (several days)  Decreased blood pressure (temporary)  Weakness in arm/leg (temporary)  Pressure sensation in back/neck (temporary)  Call if you experience:  Fever/chills associated with headache or increased back/neck pain.  Headache worsened by an upright position.  New onset weakness or numbness of an extremity below the injection site  Hives or difficulty breathing (go to the emergency room)  Inflammation or drainage at the infection site  Severe back/neck pain  Any new symptoms which are concerning to you  Please note:  Although the local anesthetic injected can often make your back or neck feel good for several hours after the injection, the pain will likely return.  It takes 3-7 days for steroids to work in the epidural space.  You may not notice any pain relief for at least that one week.  If effective, we will often do a series of three injections spaced 3-6 weeks apart to maximally decrease your pain.  After the initial series, we generally will wait several months before considering a repeat injection of the same type.  If you have any questions, please call [phone] [facility] 

## 2019-10-11 NOTE — Progress Notes (Signed)
Safety precautions to be maintained throughout the outpatient stay will include: orient to surroundings, keep bed in low position, maintain call bell within reach at all times, provide assistance with transfer out of bed and ambulation.  

## 2019-10-12 ENCOUNTER — Telehealth: Payer: Self-pay | Admitting: *Deleted

## 2019-10-12 NOTE — Telephone Encounter (Signed)
Called for post procedure check. No answer. LVM. 

## 2019-10-12 NOTE — Progress Notes (Signed)
Subjective:  Patient ID: Thomas [name], male    DOB: 1933/03/20  Age: 86 y.o. MRN: [id number]  CC: Back Pain (low)   Procedure: L4-5 epidural steroid under fluoroscopic guidance with no sedation  HPI Thomas [name] presents for reevaluation.  Thomas [name] presents today with recurrent low back pain with radiation to the hips buttocks and right anterior thigh.  Continues to have sciatica-like symptoms affecting the bilateral lower extremities.  The quality characteristic and pain that he reports are similar to what he has had in the past.  Previously he had an epidural steroid injection in November 2020 which gave him significant long-term improvement and eliminated the majority of his anterior right thigh pain.  He continues to be active running 2 miles 3-4 times a week and reports that he is otherwise in good shape.  His pain is generally well controlled however he has had a recent exacerbation of the past month.  He desires to proceed with a repeat epidural today.  Otherwise he is in his usual state of health no new changes in lower extremity strength function or bowel bladder function.  Outpatient Medications Prior to Visit  Medication Sig Dispense Refill  . allopurinol (ZYLOPRIM) 100 MG tablet Take 2 tablets (200 mg total) by mouth daily. 180 tablet 1  . allopurinol (ZYLOPRIM) 100 MG tablet Take 1 tablet (100 mg total) by mouth daily. 30 tablet 0  . allopurinol (ZYLOPRIM) 300 MG tablet Take 300 mg by mouth daily.    Marland Kitchen amLODipine (NORVASC) 5 MG tablet Take 1 tablet (5 mg total) by mouth daily. 30 tablet 0  . apixaban (ELIQUIS) 5 MG TABS tablet Take 1 tablet (5 mg total) by mouth 2 (two) times daily. 60 tablet 5  . atorvastatin (LIPITOR) 40 MG tablet TAKE 1 TABLET BY MOUTH DAILY AT 6 PM 30 tablet 5  . cefdinir (OMNICEF) 300 MG capsule Take 1 capsule (300 mg total) by mouth daily. 30 capsule 5  . Cholecalciferol (VITAMIN D3) 5000 units CAPS Take 5,000 Units by mouth daily.    . Coenzyme Q10 (COQ10)  100 MG CAPS Take 100 mg by mouth daily.    . colchicine (COLCRYS) 0.6 MG tablet Take 1 tablet (0.6 mg total) by mouth daily as needed (for gout). 30 tablet 5  . diclofenac sodium (VOLTAREN) 1 % GEL Apply 2 g topically daily as needed (for pain).     Marland Kitchen dofetilide (TIKOSYN) 125 MCG capsule TAKE 1 CAPSULE BY MOUTH 2 TIMES DAILY 180 capsule 1  . doxycycline (VIBRA-TABS) 100 MG tablet Take 1 tablet (100 mg total) by mouth daily. 30 tablet 5  . dutasteride (AVODART) 0.5 MG capsule Take 0.5 mg by mouth daily as needed (for urination).     Marland Kitchen esomeprazole (NEXIUM) 40 MG capsule Take 40 mg by mouth daily as needed (for heartburn).     . hydrALAZINE (APRESOLINE) 25 MG tablet Take 1 tablet (25 mg total) by mouth 3 (three) times daily as needed. (Patient taking differently: Take 25 mg by mouth 3 (three) times daily as needed (for BP > 150). ) 90 tablet 6  . metoprolol succinate (TOPROL XL) 50 MG 24 hr tablet Take 1 tablet (50 mg total) by mouth daily. Take with or immediately following a meal. Please schedule office visit before any future refills. 90 tablet 0  . metroNIDAZOLE (FLAGYL) 500 MG tablet TAKE 1 TABLET BY MOUTH TWICE DAILY ON SUNDAY AND MONDAY 30 tablet 11  . Multiple Vitamins-Minerals (CENTRUM SILVER 50+MEN PO)  Take 1 tablet by mouth daily.    . NON FORMULARY Testosterone gel compound - apply 1 cc per day    . propranolol (INDERAL) 40 MG tablet TAKE ONE (1) TABLET THREE (3) TIMES EACHDAY AS NEEDED (Patient taking differently: TAKE ONE (1) TABLET THREE (3) TIMES daily AS NEEDED for afib) 270 tablet 1  . silodosin (RAPAFLO) 8 MG CAPS capsule Take 8 mg by mouth daily as needed (for urintation).     . temazepam (RESTORIL) 30 MG capsule Take 30 mg by mouth at bedtime as needed.     . terazosin (HYTRIN) 1 MG capsule Take 1 mg by mouth at bedtime.    . terbinafine (LAMISIL) 250 MG tablet Take 250 mg by mouth daily as needed.     . Testosterone 1.62 % GEL APPLY 2 PUMP DAILY 75 g 3  . testosterone cypionate  (DEPOTESTOSTERONE CYPIONATE) 200 MG/ML injection Inject 1 mL (200 mg total) into the muscle every 28 (twenty-eight) days. 10 mL 0  . traMADol (ULTRAM) 50 MG tablet Take 50 mg by mouth every 6 (six) hours as needed for moderate pain.     . Turmeric 500 MG CAPS Take 500 mg by mouth daily.    . vitamin E 400 UNIT capsule Take 400 Units by mouth daily.     No facility-administered medications prior to visit.    Review of Systems CNS: No confusion or sedation Cardiac: No angina or palpitations GI: No abdominal pain or constipation Constitutional: No nausea vomiting fevers or chills  Objective:  BP (!) 165/94   Pulse 60   Temp (!) 97.3 F (36.3 C) (Temporal)   Resp 16   Ht 5\' 7"  (1.702 m)   Wt 160 lb (72.6 kg)   SpO2 97%   BMI 25.06 kg/m    BP Readings from Last 3 Encounters:  10/11/19 (!) 165/94  10/08/19 122/76  03/31/19 (!) 152/92     Wt Readings from Last 3 Encounters:  10/11/19 160 lb (72.6 kg)  10/08/19 160 lb (72.6 kg)  03/31/19 160 lb (72.6 kg)     Physical Exam Pt is alert and oriented PERRL EOMI HEART IS RRR no murmur or rub LCTA no wheezing or rales MUSCULOSKELETAL reveals some paraspinous muscle tenderness in the lumbar region but no overt trigger points.  He walks with a mildly antalgic gait but is otherwise moving well.  His lower extremity strength and function appear to be at baseline with good muscle tone and bulk and a positive straight leg raise in the right side.  Labs  Lab Results  Component Value Date   HGBA1C 5.6 07/11/2016   HGBA1C 5.5 06/14/2016   Lab Results  Component Value Date   LDLCALC 74 02/24/2018   CREATININE 1.64 (H) 01/12/2019    -------------------------------------------------------------------------------------------------------------------- Lab Results  Component Value Date   WBC 6.0 03/31/2019   HGB 15.2 03/31/2019   HCT 48.3 09/01/2019   PLT 162 03/31/2019   GLUCOSE 98 01/12/2019   CHOL 141 02/24/2018   TRIG 39  02/24/2018   HDL 59 02/24/2018   LDLCALC 74 02/24/2018   ALT 18 02/24/2018   AST 23 02/24/2018   NA 137 01/12/2019   K 4.7 01/12/2019   CL 97 01/12/2019   CREATININE 1.64 (H) 01/12/2019   BUN 21 01/12/2019   CO2 26 01/12/2019   TSH 2.260 11/21/2017   PSA 5.8 08/22/2014   INR 1.0 03/31/2019   HGBA1C 5.6 07/11/2016    --------------------------------------------------------------------------------------------------------------------- DG PAIN CLINIC C-ARM 1-60  MIN NO REPORT  Result Date: 10/11/2019 Fluoro was used, but no Radiologist interpretation will be provided. Please refer to "NOTES" tab for provider progress note.    Assessment & Plan:   Mason was seen today for back pain.  Diagnoses and all orders for this visit:  DDD (degenerative disc disease), lumbar -     Lumbar Epidural Injection; Future  Spinal stenosis of lumbar region without neurogenic claudication -     Lumbar Epidural Injection; Future  Bilateral sciatica -     Lumbar Epidural Injection; Future  Sciatica, right side  Sciatica of left side  Other orders -     triamcinolone acetonide (KENALOG-40) injection 40 mg -     sodium chloride flush (NS) 0.9 % injection 10 mL -     ropivacaine (PF) 2 mg/mL (0.2%) (NAROPIN) injection 10 mL -     lidocaine (PF) (XYLOCAINE) 1 % injection 5 mL -     iohexol (OMNIPAQUE) 180 MG/ML injection 10 mL        ----------------------------------------------------------------------------------------------------------------------  Problem List Items Addressed This Visit    None    Visit Diagnoses    DDD (degenerative disc disease), lumbar    -  Primary   Relevant Medications   triamcinolone acetonide (KENALOG-40) injection 40 mg (Completed)   Other Relevant Orders   Lumbar Epidural Injection   Spinal stenosis of lumbar region without neurogenic claudication       Relevant Orders   Lumbar Epidural Injection   Bilateral sciatica       Relevant Orders   Lumbar  Epidural Injection   Sciatica, right side       Sciatica of left side            ----------------------------------------------------------------------------------------------------------------------  1. DDD (degenerative disc disease), lumbar Based on our discussion today we will proceed with a repeat epidural steroid injection today.  I will have him return to clinic on a as needed basis.  We have reviewed the risks and benefits of the procedure in full detail and all questions answered.  I encouraged him to continue with exercise strength training and follow-up as needed - Lumbar Epidural Injection; Future  2. Spinal stenosis of lumbar region without neurogenic claudication As above - Lumbar Epidural Injection; Future  3. Bilateral sciatica As above - Lumbar Epidural Injection; Future  4. Sciatica, right side   5. Sciatica of left side     ----------------------------------------------------------------------------------------------------------------------  I am having Thomas [name] maintain his esomeprazole, Voltaren, hydrALAZINE, terazosin, traMADol, propranolol, dutasteride, silodosin, Multiple Vitamins-Minerals (CENTRUM SILVER 50+MEN PO), CoQ10, vitamin E, Vitamin D3, Turmeric, NON FORMULARY, temazepam, colchicine, allopurinol, doxycycline, cefdinir, metroNIDAZOLE, allopurinol, terbinafine, apixaban, atorvastatin, allopurinol, dofetilide, metoprolol succinate, amLODipine, Testosterone, and testosterone cypionate. We administered triamcinolone acetonide, sodium chloride flush, ropivacaine (PF) 2 mg/mL (0.2%), and lidocaine (PF).   Meds ordered this encounter  Medications  . triamcinolone acetonide (KENALOG-40) injection 40 mg  . sodium chloride flush (NS) 0.9 % injection 10 mL  . ropivacaine (PF) 2 mg/mL (0.2%) (NAROPIN) injection 10 mL  . lidocaine (PF) (XYLOCAINE) 1 % injection 5 mL  . iohexol (OMNIPAQUE) 180 MG/ML injection 10 mL   Patient's Medications  New  Prescriptions   No medications on file  Previous Medications   ALLOPURINOL (ZYLOPRIM) 100 MG TABLET    Take 2 tablets (200 mg total) by mouth daily.   ALLOPURINOL (ZYLOPRIM) 100 MG TABLET    Take 1 tablet (100 mg total) by mouth daily.   ALLOPURINOL (ZYLOPRIM) 300 MG TABLET  Take 300 mg by mouth daily.   AMLODIPINE (NORVASC) 5 MG TABLET    Take 1 tablet (5 mg total) by mouth daily.   APIXABAN (ELIQUIS) 5 MG TABS TABLET    Take 1 tablet (5 mg total) by mouth 2 (two) times daily.   ATORVASTATIN (LIPITOR) 40 MG TABLET    TAKE 1 TABLET BY MOUTH DAILY AT 6 PM   CEFDINIR (OMNICEF) 300 MG CAPSULE    Take 1 capsule (300 mg total) by mouth daily.   CHOLECALCIFEROL (VITAMIN D3) 5000 UNITS CAPS    Take 5,000 Units by mouth daily.   COENZYME Q10 (COQ10) 100 MG CAPS    Take 100 mg by mouth daily.   COLCHICINE (COLCRYS) 0.6 MG TABLET    Take 1 tablet (0.6 mg total) by mouth daily as needed (for gout).   DICLOFENAC SODIUM (VOLTAREN) 1 % GEL    Apply 2 g topically daily as needed (for pain).    DOFETILIDE (TIKOSYN) 125 MCG CAPSULE    TAKE 1 CAPSULE BY MOUTH 2 TIMES DAILY   DOXYCYCLINE (VIBRA-TABS) 100 MG TABLET    Take 1 tablet (100 mg total) by mouth daily.   DUTASTERIDE (AVODART) 0.5 MG CAPSULE    Take 0.5 mg by mouth daily as needed (for urination).    ESOMEPRAZOLE (NEXIUM) 40 MG CAPSULE    Take 40 mg by mouth daily as needed (for heartburn).    HYDRALAZINE (APRESOLINE) 25 MG TABLET    Take 1 tablet (25 mg total) by mouth 3 (three) times daily as needed.   METOPROLOL SUCCINATE (TOPROL XL) 50 MG 24 HR TABLET    Take 1 tablet (50 mg total) by mouth daily. Take with or immediately following a meal. Please schedule office visit before any future refills.   METRONIDAZOLE (FLAGYL) 500 MG TABLET    TAKE 1 TABLET BY MOUTH TWICE DAILY ON SUNDAY AND MONDAY   MULTIPLE VITAMINS-MINERALS (CENTRUM SILVER 50+MEN PO)    Take 1 tablet by mouth daily.   NON FORMULARY    Testosterone gel compound - apply 1 cc per day    PROPRANOLOL (INDERAL) 40 MG TABLET    TAKE ONE (1) TABLET THREE (3) TIMES EACHDAY AS NEEDED   SILODOSIN (RAPAFLO) 8 MG CAPS CAPSULE    Take 8 mg by mouth daily as needed (for urintation).    TEMAZEPAM (RESTORIL) 30 MG CAPSULE    Take 30 mg by mouth at bedtime as needed.    TERAZOSIN (HYTRIN) 1 MG CAPSULE    Take 1 mg by mouth at bedtime.   TERBINAFINE (LAMISIL) 250 MG TABLET    Take 250 mg by mouth daily as needed.    TESTOSTERONE 1.62 % GEL    APPLY 2 PUMP DAILY   TESTOSTERONE CYPIONATE (DEPOTESTOSTERONE CYPIONATE) 200 MG/ML INJECTION    Inject 1 mL (200 mg total) into the muscle every 28 (twenty-eight) days.   TRAMADOL (ULTRAM) 50 MG TABLET    Take 50 mg by mouth every 6 (six) hours as needed for moderate pain.    TURMERIC 500 MG CAPS    Take 500 mg by mouth daily.   VITAMIN E 400 UNIT CAPSULE    Take 400 Units by mouth daily.  Modified Medications   No medications on file  Discontinued Medications   No medications on file   ----------------------------------------------------------------------------------------------------------------------  Follow-up: Return if symptoms worsen or fail to improve, for procedure.   Procedure: L4-L5 LESI with fluoroscopic guidance and no moderate sedation  NOTE: The  risks, benefits, and expectations of the procedure have been discussed and explained to the patient who was understanding and in agreement with suggested treatment plan. No guarantees were made.  DESCRIPTION OF PROCEDURE: Lumbar epidural steroid injection with no IV Versed, EKG, blood pressure, pulse, and pulse oximetry monitoring. The procedure was performed with the patient in the prone position under fluoroscopic guidance.  Sterile prep x3 was initiated and I then injected subcutaneous lidocaine to the overlying L4-L5 site after its fluoroscopic identifictation.  Using strict aseptic technique, I then advanced an 18-gauge Tuohy epidural needle in the midline using interlaminar approach via  loss-of-resistance to saline technique. There was negative aspiration for heme or  CSF.  I then confirmed position with both AP and Lateral fluoroscan.  2 cc of contrast dye were injected and a  total of 5 mL of Preservative-Free normal saline mixed with 40 mg of Kenalog and 1cc Ropicaine 0.2 percent were injected incrementally via the  epidurally placed needle. The needle was removed. The patient tolerated the injection well and was convalesced and discharged to home in stable condition. Should the patient have any post procedure difficulty they have been instructed on how to contact us for assistance.    Molli Barrows, MD

## 2019-11-02 ENCOUNTER — Ambulatory Visit (INDEPENDENT_AMBULATORY_CARE_PROVIDER_SITE_OTHER): Payer: Medicare Other | Admitting: *Deleted

## 2019-11-02 DIAGNOSIS — I442 Atrioventricular block, complete: Secondary | ICD-10-CM

## 2019-11-02 LAB — CUP PACEART REMOTE DEVICE CHECK
Battery Remaining Longevity: 86 mo
Battery Voltage: 2.98 V
Brady Statistic AP VP Percent: 84.15 %
Brady Statistic AP VS Percent: 0.02 %
Brady Statistic AS VP Percent: 15.73 %
Brady Statistic AS VS Percent: 0.1 %
Brady Statistic RA Percent Paced: 78.85 %
Brady Statistic RV Percent Paced: 99.84 %
Date Time Interrogation Session: 20210629030859
Implantable Lead Implant Date: 20180214
Implantable Lead Implant Date: 20180214
Implantable Lead Location: 753859
Implantable Lead Location: 753860
Implantable Lead Model: 5076
Implantable Lead Model: 5076
Implantable Pulse Generator Implant Date: 20180214
Lead Channel Impedance Value: 304 Ohm
Lead Channel Impedance Value: 342 Ohm
Lead Channel Impedance Value: 380 Ohm
Lead Channel Impedance Value: 399 Ohm
Lead Channel Pacing Threshold Amplitude: 0.5 V
Lead Channel Pacing Threshold Amplitude: 0.875 V
Lead Channel Pacing Threshold Pulse Width: 0.4 ms
Lead Channel Pacing Threshold Pulse Width: 0.4 ms
Lead Channel Sensing Intrinsic Amplitude: 1.25 mV
Lead Channel Sensing Intrinsic Amplitude: 1.25 mV
Lead Channel Sensing Intrinsic Amplitude: 12.125 mV
Lead Channel Sensing Intrinsic Amplitude: 12.125 mV
Lead Channel Setting Pacing Amplitude: 2 V
Lead Channel Setting Pacing Amplitude: 2.5 V
Lead Channel Setting Pacing Pulse Width: 0.4 ms
Lead Channel Setting Sensing Sensitivity: 0.9 mV

## 2019-11-04 NOTE — Progress Notes (Signed)
Remote pacemaker transmission.   

## 2019-11-24 ENCOUNTER — Other Ambulatory Visit: Payer: Self-pay | Admitting: Family Medicine

## 2019-11-24 NOTE — Telephone Encounter (Signed)
Requested medication (s) are due for refill today:  Yes  Requested medication (s) are on the active medication list:  Yes  Future visit scheduled:  No  Last Refill: 09/20/19; #30/ no refills.  Notes to clinic:  Phone call to pt.  Left vm to call office and schedule annual physical.  Pt. has already rec'd a courtesy refill.      Requested Prescriptions  Pending Prescriptions Disp Refills   amLODipine (NORVASC) 5 MG tablet [Pharmacy Med Name: AMLODIPINE BESYLATE 5 MG TAB] 180 tablet     Sig: TAKE 1 TABLET BY MOUTH DAILY      Cardiovascular:  Calcium Channel Blockers Failed - 11/24/2019 12:01 PM      Failed - Last BP in normal range    BP Readings from Last 1 Encounters:  10/11/19 (!) 165/94          Failed - Valid encounter within last 6 months    Recent Outpatient Visits           1 year ago Chest pain, unspecified type   [facility] Jerrol Banana., MD   1 year ago Gout, unspecified cause, unspecified chronicity, unspecified site   [facility] Jerrol Banana., MD   2 years ago Atrial fibrillation, unspecified type [facility])   [facility] Jerrol Banana., MD   2 years ago Shortness of breath   [facility] Jerrol Banana., MD   3 years ago Hospital discharge follow-up   [facility] Jerrol Banana., MD

## 2019-11-25 ENCOUNTER — Other Ambulatory Visit: Payer: Self-pay | Admitting: Family Medicine

## 2019-11-25 NOTE — Telephone Encounter (Signed)
Requested medication (s) are due for refill today: yes  Requested medication (s) are on the active medication list: yes  Last refill:  09/20/2019  Future visit scheduled: yes  Notes to clinic:  Patient has appointment on 11/30/2019  And would like to have a supply until then   Requested Prescriptions  Pending Prescriptions Disp Refills   amLODipine (NORVASC) 5 MG tablet 30 tablet 0    Sig: Take 1 tablet (5 mg total) by mouth daily.      Cardiovascular:  Calcium Channel Blockers Failed - 11/25/2019  1:49 PM      Failed - Last BP in normal range    BP Readings from Last 1 Encounters:  10/11/19 (!) 165/94          Failed - Valid encounter within last 6 months    Recent Outpatient Visits           1 year ago Chest pain, unspecified type   [facility] Jerrol Banana., MD   1 year ago Gout, unspecified cause, unspecified chronicity, unspecified site   [facility] Jerrol Banana., MD   2 years ago Atrial fibrillation, unspecified type [facility])   [facility] Jerrol Banana., MD   2 years ago Shortness of breath   [facility] Jerrol Banana., MD   3 years ago Hospital discharge follow-up   [facility] Jerrol Banana., MD

## 2019-11-25 NOTE — Telephone Encounter (Signed)
Medication Refill - Medication: amLODipine (NORVASC) 5 MG tablet [[id number]]   Preferred Pharmacy (with phone number or street name):  [facility] [address] [place], [place] - [place]  [place] [address]  Phone: [phone] Fax: [phone]     Agent: Please be advised that RX refills may take up to 3 business days. We ask that you follow-up with your pharmacy.

## 2019-11-30 ENCOUNTER — Ambulatory Visit (INDEPENDENT_AMBULATORY_CARE_PROVIDER_SITE_OTHER): Payer: Medicare Other | Admitting: Family Medicine

## 2019-11-30 VITALS — Temp 99.1°F

## 2019-11-30 DIAGNOSIS — J069 Acute upper respiratory infection, unspecified: Secondary | ICD-10-CM

## 2019-11-30 DIAGNOSIS — R05 Cough: Secondary | ICD-10-CM

## 2019-11-30 NOTE — Progress Notes (Signed)
Virtual Visit via Telephone Note  I connected with Thomas [name] on 11/30/19 at  2:00 PM EDT by telephone and verified that I am speaking with the correct person using two identifiers.  Location: Patient: home Provider: office   I discussed the limitations, risks, security and privacy concerns of performing an evaluation and management service by telephone and the availability of in person appointments. I also discussed with the patient that there may be a patient responsible charge related to this service. The patient expressed understanding and agreed to proceed.   History of Present Illness: Patient and his wife about a "summer cold" for the last 7 to 10 days with cough morning congestion and some postnasal drainage.  No fevers no hemoptysis no myalgias.  He has had both Covid vaccines.   Observations/Objective:   Assessment and Plan: 1. Viral upper respiratory tract infection Continue fluids and Mucinex for the cough.  2. Cough Due to the persistent cough and what is going on with the pandemic will order Covid test.  Would refer for immunotherapy if Covid is positive.  Patient is aware of this.  I will see him back in a couple of months to check his general condition as he has not seen me in the office is 2019.  We will also set up lab work then.  He sees cardiology regularly.   Follow Up Instructions:    I discussed the assessment and treatment plan with the patient. The patient was provided an opportunity to ask questions and all were answered. The patient agreed with the plan and demonstrated an understanding of the instructions.   The patient was advised to call back or seek an in-person evaluation if the symptoms worsen or if the condition fails to improve as anticipated.  I provided 12 minutes of non-face-to-face time during this encounter.   Thomas Maskell Cranford Mon, MD  Virtual telephone visit   I discussed the limitations of evaluation and management by telemedicine  and the availability of in person appointments. The patient expressed understanding and agreed to proceed.   Virtual Visit via Telephone Note   This visit type was conducted due to national recommendations for restrictions regarding the COVID-19 Pandemic (e.g. social distancing) in an effort to limit this patient's exposure and mitigate transmission in our community. Due to his co-morbid illnesses, this patient is at least at moderate risk for complications without adequate follow up. This format is felt to be most appropriate for this patient at this time. The patient did not have access to video technology or had technical difficulties with video requiring transitioning to audio format only (telephone). Physical exam was limited to content and character of the telephone converstion.    Patient location: home Provider location: office   Visit Date: 11/30/2019  Today's healthcare provider: [name], MD   No chief complaint on file.  Subjective    HPI   The patient is an 86 year old male who presents via phone call for symptoms that began about 1 week ago.  He states he started having cough and sore throat.  He then had sinus drainage with it going into the chest and now continues with productive cough.  He has had low grade temperature of 99.1.  He denies any body aches, nausea, diarrhea or other viral type symptoms.   He has never been tested for Covid and has been fully vaccinated. He denies knowingly being around anyone that has been sick.  Medications: Outpatient Medications Prior to Visit  Medication Sig  . allopurinol (ZYLOPRIM) 100 MG tablet Take 2 tablets (200 mg total) by mouth daily.  Marland Kitchen allopurinol (ZYLOPRIM) 100 MG tablet Take 1 tablet (100 mg total) by mouth daily.  Marland Kitchen allopurinol (ZYLOPRIM) 300 MG tablet Take 300 mg by mouth daily.  Marland Kitchen amLODipine (NORVASC) 5 MG tablet Take 1 tablet (5 mg total) by mouth daily.  Marland Kitchen apixaban (ELIQUIS) 5 MG TABS tablet Take 1  tablet (5 mg total) by mouth 2 (two) times daily.  Marland Kitchen atorvastatin (LIPITOR) 40 MG tablet TAKE 1 TABLET BY MOUTH DAILY AT 6 PM  . cefdinir (OMNICEF) 300 MG capsule Take 1 capsule (300 mg total) by mouth daily.  . Cholecalciferol (VITAMIN D3) 5000 units CAPS Take 5,000 Units by mouth daily.  . Coenzyme Q10 (COQ10) 100 MG CAPS Take 100 mg by mouth daily.  . colchicine (COLCRYS) 0.6 MG tablet Take 1 tablet (0.6 mg total) by mouth daily as needed (for gout).  Marland Kitchen diclofenac sodium (VOLTAREN) 1 % GEL Apply 2 g topically daily as needed (for pain).   Marland Kitchen dofetilide (TIKOSYN) 125 MCG capsule TAKE 1 CAPSULE BY MOUTH 2 TIMES DAILY  . doxycycline (VIBRA-TABS) 100 MG tablet Take 1 tablet (100 mg total) by mouth daily.  Marland Kitchen dutasteride (AVODART) 0.5 MG capsule Take 0.5 mg by mouth daily as needed (for urination).   Marland Kitchen esomeprazole (NEXIUM) 40 MG capsule Take 40 mg by mouth daily as needed (for heartburn).   . hydrALAZINE (APRESOLINE) 25 MG tablet Take 1 tablet (25 mg total) by mouth 3 (three) times daily as needed. (Patient taking differently: Take 25 mg by mouth 3 (three) times daily as needed (for BP > 150). )  . metoprolol succinate (TOPROL XL) 50 MG 24 hr tablet Take 1 tablet (50 mg total) by mouth daily. Take with or immediately following a meal. Please schedule office visit before any future refills.  . metroNIDAZOLE (FLAGYL) 500 MG tablet TAKE 1 TABLET BY MOUTH TWICE DAILY ON SUNDAY AND MONDAY  . Multiple Vitamins-Minerals (CENTRUM SILVER 50+MEN PO) Take 1 tablet by mouth daily.  . NON FORMULARY Testosterone gel compound - apply 1 cc per day  . propranolol (INDERAL) 40 MG tablet TAKE ONE (1) TABLET THREE (3) TIMES EACHDAY AS NEEDED (Patient taking differently: TAKE ONE (1) TABLET THREE (3) TIMES daily AS NEEDED for afib)  . silodosin (RAPAFLO) 8 MG CAPS capsule Take 8 mg by mouth daily as needed (for urintation).   . temazepam (RESTORIL) 30 MG capsule Take 30 mg by mouth at bedtime as needed.   . terazosin  (HYTRIN) 1 MG capsule Take 1 mg by mouth at bedtime.  . terbinafine (LAMISIL) 250 MG tablet Take 250 mg by mouth daily as needed.   . Testosterone 1.62 % GEL APPLY 2 PUMP DAILY  . testosterone cypionate (DEPOTESTOSTERONE CYPIONATE) 200 MG/ML injection Inject 1 mL (200 mg total) into the muscle every 28 (twenty-eight) days.  . traMADol (ULTRAM) 50 MG tablet Take 50 mg by mouth every 6 (six) hours as needed for moderate pain.   . Turmeric 500 MG CAPS Take 500 mg by mouth daily.  . vitamin E 400 UNIT capsule Take 400 Units by mouth daily.   No facility-administered medications prior to visit.    Review of Systems  Constitutional: Positive for fever (low grade).  HENT: Positive for congestion, postnasal drip and sore throat.   Respiratory: Positive for cough. Negative for shortness of breath.  Objective    There were no vitals taken for this visit.      Assessment & Plan       No follow-ups on file.    I discussed the assessment and treatment plan with the patient. The patient was provided an opportunity to ask questions and all were answered. The patient agreed with the plan and demonstrated an understanding of the instructions.   The patient was advised to call back or seek an in-person evaluation if the symptoms worsen or if the condition fails to improve as anticipated.  I provided 12 minutes of non-face-to-face time during this encounter.    Rochella Benner Cranford Mon, MD [facility] [phone] (phone) [phone] (fax)  [place]

## 2019-12-01 ENCOUNTER — Ambulatory Visit: Payer: Medicare Other | Attending: Internal Medicine

## 2019-12-01 DIAGNOSIS — Z20822 Contact with and (suspected) exposure to covid-19: Secondary | ICD-10-CM

## 2019-12-02 LAB — NOVEL CORONAVIRUS, NAA: SARS-CoV-2, NAA: NOT DETECTED

## 2019-12-02 LAB — SARS-COV-2, NAA 2 DAY TAT

## 2019-12-27 ENCOUNTER — Other Ambulatory Visit: Payer: Self-pay | Admitting: Family Medicine

## 2019-12-27 NOTE — Telephone Encounter (Signed)
Pt's last appt was tele; Gave enough med to next [place] on 02/03/20.

## 2020-01-17 ENCOUNTER — Other Ambulatory Visit: Payer: Self-pay | Admitting: Internal Medicine

## 2020-01-31 DIAGNOSIS — M1A00X0 Idiopathic chronic gout, unspecified site, without tophus (tophi): Secondary | ICD-10-CM | POA: Diagnosis not present

## 2020-01-31 DIAGNOSIS — Z79899 Other long term (current) drug therapy: Secondary | ICD-10-CM | POA: Diagnosis not present

## 2020-02-01 ENCOUNTER — Ambulatory Visit (INDEPENDENT_AMBULATORY_CARE_PROVIDER_SITE_OTHER): Payer: Medicare Other | Admitting: Emergency Medicine

## 2020-02-01 DIAGNOSIS — I442 Atrioventricular block, complete: Secondary | ICD-10-CM | POA: Diagnosis not present

## 2020-02-02 LAB — CUP PACEART REMOTE DEVICE CHECK
Battery Remaining Longevity: 82 mo
Battery Voltage: 2.98 V
Brady Statistic AP VP Percent: 83.99 %
Brady Statistic AP VS Percent: 0.01 %
Brady Statistic AS VP Percent: 15.95 %
Brady Statistic AS VS Percent: 0.05 %
Brady Statistic RA Percent Paced: 70.35 %
Brady Statistic RV Percent Paced: 99.89 %
Date Time Interrogation Session: 20210928015118
Implantable Lead Implant Date: 20180214
Implantable Lead Implant Date: 20180214
Implantable Lead Location: 753859
Implantable Lead Location: 753860
Implantable Lead Model: 5076
Implantable Lead Model: 5076
Implantable Pulse Generator Implant Date: 20180214
Lead Channel Impedance Value: 304 Ohm
Lead Channel Impedance Value: 342 Ohm
Lead Channel Impedance Value: 380 Ohm
Lead Channel Impedance Value: 418 Ohm
Lead Channel Pacing Threshold Amplitude: 0.5 V
Lead Channel Pacing Threshold Amplitude: 1 V
Lead Channel Pacing Threshold Pulse Width: 0.4 ms
Lead Channel Pacing Threshold Pulse Width: 0.4 ms
Lead Channel Sensing Intrinsic Amplitude: 12.125 mV
Lead Channel Sensing Intrinsic Amplitude: 12.125 mV
Lead Channel Sensing Intrinsic Amplitude: 3.125 mV
Lead Channel Sensing Intrinsic Amplitude: 3.125 mV
Lead Channel Setting Pacing Amplitude: 2 V
Lead Channel Setting Pacing Amplitude: 2.5 V
Lead Channel Setting Pacing Pulse Width: 0.4 ms
Lead Channel Setting Sensing Sensitivity: 0.9 mV

## 2020-02-03 ENCOUNTER — Other Ambulatory Visit: Payer: Self-pay

## 2020-02-03 ENCOUNTER — Ambulatory Visit (INDEPENDENT_AMBULATORY_CARE_PROVIDER_SITE_OTHER): Payer: Medicare Other | Admitting: Family Medicine

## 2020-02-03 ENCOUNTER — Encounter: Payer: Self-pay | Admitting: Family Medicine

## 2020-02-03 VITALS — BP 118/72 | HR 76 | Temp 97.9°F | Ht 67.0 in | Wt 168.4 lb

## 2020-02-03 DIAGNOSIS — I10 Essential (primary) hypertension: Secondary | ICD-10-CM

## 2020-02-03 DIAGNOSIS — E782 Mixed hyperlipidemia: Secondary | ICD-10-CM

## 2020-02-03 DIAGNOSIS — N401 Enlarged prostate with lower urinary tract symptoms: Secondary | ICD-10-CM

## 2020-02-03 DIAGNOSIS — M109 Gout, unspecified: Secondary | ICD-10-CM

## 2020-02-03 DIAGNOSIS — I4819 Other persistent atrial fibrillation: Secondary | ICD-10-CM

## 2020-02-03 DIAGNOSIS — A6920 Lyme disease, unspecified: Secondary | ICD-10-CM | POA: Diagnosis not present

## 2020-02-03 DIAGNOSIS — I2510 Atherosclerotic heart disease of native coronary artery without angina pectoris: Secondary | ICD-10-CM

## 2020-02-03 DIAGNOSIS — C642 Malignant neoplasm of left kidney, except renal pelvis: Secondary | ICD-10-CM

## 2020-02-03 DIAGNOSIS — I442 Atrioventricular block, complete: Secondary | ICD-10-CM

## 2020-02-03 DIAGNOSIS — E291 Testicular hypofunction: Secondary | ICD-10-CM

## 2020-02-03 NOTE — Progress Notes (Signed)
Established patient visit   Patient: Thomas [name]   DOB: 03-14-1933   86 y.o. Male  MRN: [id number] Visit Date: 02/03/2020  Today's healthcare provider: [name], MD   Chief Complaint  Patient presents with  . Hyperlipidemia  . 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  Overall he feels fairly well, he continues to exercise by jogging some.  He feels like his Lyme's disease still causes him some problems.  Otherwise he has no specific complaints today.  He needs refill on medications and he needs lab work set up. Hypertension, follow-up  BP Readings from Last 3 Encounters:  02/03/20 118/72  10/11/19 (!) 165/94  10/08/19 122/76   Wt Readings from Last 3 Encounters:  02/03/20 168 lb 6.4 oz (76.4 kg)  10/11/19 160 lb (72.6 kg)  10/08/19 160 lb (72.6 kg)     He was last seen for hypertension 8 months ago.  BP at that visit was 142/64. Management since that visit includes; on metoprolol and amlodipine. He reports excellent compliance with treatment. He is not having side effects.  He is exercising. He is adherent to low salt diet.   Outside blood pressures are being checked.  He does not smoke.  Use of agents associated with hypertension: none.   --------------------------------------------------------------------------------------------------- Lipid/Cholesterol, follow-up  Last Lipid Panel: Lab Results  Component Value Date   CHOL 141 02/24/2018   LDLCALC 74 02/24/2018   HDL 59 02/24/2018   TRIG 39 02/24/2018    He was last seen for this 2 years ago.  Management since that visit includes; on atorvastatin. He reports excellent compliance with treatment. He is not having side effects.  He is following a Regular diet. Current exercise: running/ jogging  Last metabolic panel Lab Results  Component Value Date   GLUCOSE 98 01/12/2019   NA 137 01/12/2019   K 4.7  01/12/2019   BUN 21 01/12/2019   CREATININE 1.64 (H) 01/12/2019   GFRNONAA 38 (L) 01/12/2019   GFRAA 44 (L) 01/12/2019   CALCIUM 9.6 01/12/2019   AST 23 02/24/2018   ALT 18 02/24/2018   The ASCVD Risk score (Goff DC Jr., et al., 2013) failed to calculate for the following reasons:   The 2013 ASCVD risk score is only valid for ages 57 to 56   The patient has a prior MI or stroke diagnosis  ---------------------------------------------------------------------------------------------------   Social History   Tobacco Use  . Smoking status: Never Smoker  . Smokeless tobacco: Never Used  Vaping Use  . Vaping Use: Never used  Substance Use Topics  . Alcohol use: Yes    Alcohol/week: 1.0 standard drink    Types: 1 Glasses of wine per week  . Drug use: No       Medications: Outpatient Medications Prior to Visit  Medication Sig  . allopurinol (ZYLOPRIM) 100 MG tablet Take 2 tablets (200 mg total) by mouth daily.  Marland Kitchen allopurinol (ZYLOPRIM) 100 MG tablet Take 1 tablet (100 mg total) by mouth daily.  Marland Kitchen allopurinol (ZYLOPRIM) 300 MG tablet Take 300 mg by mouth daily.  Marland Kitchen amLODipine (NORVASC) 5 MG tablet TAKE 1 TABLET BY MOUTH DAILY  . apixaban (ELIQUIS) 5 MG TABS tablet Take 1 tablet (5 mg total) by mouth 2 (two) times daily.  Marland Kitchen atorvastatin (LIPITOR) 40 MG tablet TAKE 1 TABLET BY MOUTH DAILY AT 6 PM  . cefdinir (OMNICEF) 300  MG capsule Take 1 capsule (300 mg total) by mouth daily.  . Cholecalciferol (VITAMIN D3) 5000 units CAPS Take 5,000 Units by mouth daily.  . Coenzyme Q10 (COQ10) 100 MG CAPS Take 100 mg by mouth daily.  . colchicine (COLCRYS) 0.6 MG tablet Take 1 tablet (0.6 mg total) by mouth daily as needed (for gout).  Marland Kitchen diclofenac sodium (VOLTAREN) 1 % GEL Apply 2 g topically daily as needed (for pain).   Marland Kitchen dofetilide (TIKOSYN) 125 MCG capsule TAKE 1 CAPSULE BY MOUTH 2 TIMES DAILY  . doxycycline (VIBRA-TABS) 100 MG tablet Take 1 tablet (100 mg total) by mouth daily.  Marland Kitchen  dutasteride (AVODART) 0.5 MG capsule Take 0.5 mg by mouth daily as needed (for urination).   Marland Kitchen esomeprazole (NEXIUM) 40 MG capsule Take 40 mg by mouth daily as needed (for heartburn).   . hydrALAZINE (APRESOLINE) 25 MG tablet Take 1 tablet (25 mg total) by mouth 3 (three) times daily as needed. (Patient taking differently: Take 25 mg by mouth 3 (three) times daily as needed (for BP > 150). )  . metoprolol succinate (TOPROL XL) 50 MG 24 hr tablet Take 1 tablet (50 mg total) by mouth daily. Take with or immediately following a meal. Please schedule office visit before any future refills.  . metroNIDAZOLE (FLAGYL) 500 MG tablet TAKE 1 TABLET BY MOUTH TWICE DAILY ON SUNDAY AND MONDAY  . Multiple Vitamins-Minerals (CENTRUM SILVER 50+MEN PO) Take 1 tablet by mouth daily.  . NON FORMULARY Testosterone gel compound - apply 1 cc per day  . propranolol (INDERAL) 40 MG tablet TAKE ONE (1) TABLET THREE (3) TIMES EACHDAY AS NEEDED (Patient taking differently: TAKE ONE (1) TABLET THREE (3) TIMES daily AS NEEDED for afib)  . silodosin (RAPAFLO) 8 MG CAPS capsule Take 8 mg by mouth daily as needed (for urintation).   . temazepam (RESTORIL) 30 MG capsule Take 30 mg by mouth at bedtime as needed.   . terazosin (HYTRIN) 1 MG capsule Take 1 mg by mouth at bedtime.  . terbinafine (LAMISIL) 250 MG tablet Take 250 mg by mouth daily as needed.   . Testosterone 1.62 % GEL APPLY 2 PUMP DAILY  . testosterone cypionate (DEPOTESTOSTERONE CYPIONATE) 200 MG/ML injection Inject 1 mL (200 mg total) into the muscle every 28 (twenty-eight) days.  . traMADol (ULTRAM) 50 MG tablet Take 50 mg by mouth every 6 (six) hours as needed for moderate pain.   . Turmeric 500 MG CAPS Take 500 mg by mouth daily.  . vitamin E 400 UNIT capsule Take 400 Units by mouth daily.   No facility-administered medications prior to visit.    Review of Systems  Constitutional: Negative for appetite change, chills and fever.  Respiratory: Negative for  chest tightness, shortness of breath and wheezing.   Cardiovascular: Negative for chest pain and palpitations.  Gastrointestinal: Negative for abdominal pain, nausea and vomiting.    Last lipids Lab Results  Component Value Date   CHOL 141 02/24/2018   HDL 59 02/24/2018   LDLCALC 74 02/24/2018   TRIG 39 02/24/2018   CHOLHDL 2.2 11/21/2017      Objective    BP 118/72 (BP Location: Left Arm, Patient Position: Sitting, Cuff Size: Normal)   Pulse 76   Temp 97.9 F (36.6 C) (Oral)   Ht 5\' 7"  (1.702 m)   Wt 168 lb 6.4 oz (76.4 kg)   BMI 26.38 kg/m  BP Readings from Last 3 Encounters:  02/03/20 118/72  10/11/19 (!) 165/94  10/08/19 122/76   Wt Readings from Last 3 Encounters:  02/03/20 168 lb 6.4 oz (76.4 kg)  10/11/19 160 lb (72.6 kg)  10/08/19 160 lb (72.6 kg)      Physical Exam Vitals reviewed.  Constitutional:      Appearance: Normal appearance. He is well-developed.     Comments: He appears younger than his age of 72  HENT:     Head: Normocephalic and atraumatic.     Right Ear: External ear normal.     Left Ear: External ear normal.  Eyes:     General: No scleral icterus.    Conjunctiva/sclera: Conjunctivae normal.     Pupils: Pupils are equal, round, and reactive to light.  Cardiovascular:     Rate and Rhythm: Normal rate and regular rhythm.     Heart sounds: Normal heart sounds.  Pulmonary:     Effort: Pulmonary effort is normal. No respiratory distress.     Breath sounds: Normal breath sounds.  Abdominal:     Palpations: Abdomen is soft.  Genitourinary:    Rectum: Guaiac result negative.  Musculoskeletal:        General: No tenderness.     Cervical back: Normal range of motion and neck supple.  Skin:    Findings: No erythema or rash.  Neurological:     Mental Status: He is alert and oriented to person, place, and time.  Psychiatric:        Behavior: Behavior normal.        Thought Content: Thought content normal.        Judgment: Judgment normal.        No results found for any visits on 02/03/20.  Assessment & Plan     1. Coronary artery disease involving native coronary artery of native heart without angina pectoris All risk factors treated - CBC with Differential/Platelet - Comprehensive metabolic panel - TSH - Lipid panel  2. Mixed hyperlipidemia On atorvastatin 40 - CBC with Differential/Platelet - Comprehensive metabolic panel - TSH - Lipid panel  3. Essential hypertension On metoprolol, amlodipine, - CBC with Differential/Platelet - Comprehensive metabolic panel - TSH - Lipid panel  4. Gout, unspecified cause, unspecified chronicity, unspecified site On allopurinol 300 - CBC with Differential/Platelet - Comprehensive metabolic panel - TSH - Lipid panel  5. Lyme disease  - CBC with Differential/Platelet - Comprehensive metabolic panel - TSH - Lipid panel  6. Persistent atrial fibrillation (HCC) On Eliquis  7. Complete heart block ([place])   8. Renal cell carcinoma of left kidney (HCC) Followed by urology  9. Benign prostatic hyperplasia with lower urinary tract symptoms, symptom details unspecified   10. Hypogonadism male    No follow-ups on file.      I, [name], MD, have reviewed all documentation for this visit. The documentation on 02/03/20 for the exam, diagnosis, procedures, and orders are all accurate and complete.    Richard Cranford Mon, MD  [facility] [phone] (phone) [phone] (fax)  [place]

## 2020-02-04 NOTE — Progress Notes (Signed)
Remote pacemaker transmission.   

## 2020-02-07 DIAGNOSIS — A6920 Lyme disease, unspecified: Secondary | ICD-10-CM | POA: Diagnosis not present

## 2020-02-07 DIAGNOSIS — I2510 Atherosclerotic heart disease of native coronary artery without angina pectoris: Secondary | ICD-10-CM | POA: Diagnosis not present

## 2020-02-07 DIAGNOSIS — I10 Essential (primary) hypertension: Secondary | ICD-10-CM | POA: Diagnosis not present

## 2020-02-07 DIAGNOSIS — E782 Mixed hyperlipidemia: Secondary | ICD-10-CM | POA: Diagnosis not present

## 2020-02-07 DIAGNOSIS — M109 Gout, unspecified: Secondary | ICD-10-CM | POA: Diagnosis not present

## 2020-02-08 ENCOUNTER — Other Ambulatory Visit: Payer: Self-pay

## 2020-02-08 ENCOUNTER — Ambulatory Visit (INDEPENDENT_AMBULATORY_CARE_PROVIDER_SITE_OTHER): Payer: Medicare Other | Admitting: Internal Medicine

## 2020-02-08 ENCOUNTER — Other Ambulatory Visit: Payer: Self-pay | Admitting: Internal Medicine

## 2020-02-08 ENCOUNTER — Encounter: Payer: Self-pay | Admitting: Internal Medicine

## 2020-02-08 VITALS — BP 118/80 | HR 65 | Ht 67.0 in | Wt 167.0 lb

## 2020-02-08 DIAGNOSIS — I442 Atrioventricular block, complete: Secondary | ICD-10-CM | POA: Diagnosis not present

## 2020-02-08 DIAGNOSIS — Z950 Presence of cardiac pacemaker: Secondary | ICD-10-CM

## 2020-02-08 DIAGNOSIS — Z79899 Other long term (current) drug therapy: Secondary | ICD-10-CM | POA: Diagnosis not present

## 2020-02-08 DIAGNOSIS — I4819 Other persistent atrial fibrillation: Secondary | ICD-10-CM | POA: Diagnosis not present

## 2020-02-08 DIAGNOSIS — I10 Essential (primary) hypertension: Secondary | ICD-10-CM

## 2020-02-08 LAB — TSH: TSH: 3.06 u[IU]/mL (ref 0.450–4.500)

## 2020-02-08 LAB — CBC WITH DIFFERENTIAL/PLATELET
Basophils Absolute: 0.1 10*3/uL (ref 0.0–0.2)
Basos: 1 %
EOS (ABSOLUTE): 0.2 10*3/uL (ref 0.0–0.4)
Eos: 3 %
Hematocrit: 47.6 % (ref 37.5–51.0)
Hemoglobin: 15.6 g/dL (ref 13.0–17.7)
Immature Grans (Abs): 0 10*3/uL (ref 0.0–0.1)
Immature Granulocytes: 0 %
Lymphocytes Absolute: 2 10*3/uL (ref 0.7–3.1)
Lymphs: 38 %
MCH: 30.8 pg (ref 26.6–33.0)
MCHC: 32.8 g/dL (ref 31.5–35.7)
MCV: 94 fL (ref 79–97)
Monocytes Absolute: 0.8 10*3/uL (ref 0.1–0.9)
Monocytes: 15 %
Neutrophils Absolute: 2.2 10*3/uL (ref 1.4–7.0)
Neutrophils: 43 %
Platelets: 171 10*3/uL (ref 150–450)
RBC: 5.07 x10E6/uL (ref 4.14–5.80)
RDW: 14.4 % (ref 11.6–15.4)
WBC: 5.2 10*3/uL (ref 3.4–10.8)

## 2020-02-08 LAB — COMPREHENSIVE METABOLIC PANEL
ALT: 16 IU/L (ref 0–44)
AST: 23 IU/L (ref 0–40)
Albumin/Globulin Ratio: 1.9 (ref 1.2–2.2)
Albumin: 4.1 g/dL (ref 3.6–4.6)
Alkaline Phosphatase: 78 IU/L (ref 44–121)
BUN/Creatinine Ratio: 14 (ref 10–24)
BUN: 20 mg/dL (ref 8–27)
Bilirubin Total: 0.4 mg/dL (ref 0.0–1.2)
CO2: 24 mmol/L (ref 20–29)
Calcium: 9.7 mg/dL (ref 8.6–10.2)
Chloride: 101 mmol/L (ref 96–106)
Creatinine, Ser: 1.45 mg/dL — ABNORMAL HIGH (ref 0.76–1.27)
GFR calc Af Amer: 50 mL/min/{1.73_m2} — ABNORMAL LOW (ref >59)
GFR calc non Af Amer: 44 mL/min/{1.73_m2} — ABNORMAL LOW (ref >59)
Globulin, Total: 2.2 g/dL (ref 1.5–4.5)
Glucose: 94 mg/dL (ref 65–99)
Potassium: 4.4 mmol/L (ref 3.5–5.2)
Sodium: 137 mmol/L (ref 134–144)
Total Protein: 6.3 g/dL (ref 6.0–8.5)

## 2020-02-08 LAB — CUP PACEART INCLINIC DEVICE CHECK
Battery Remaining Longevity: 83 mo
Battery Voltage: 2.98 V
Brady Statistic AP VP Percent: 85.73 %
Brady Statistic AP VS Percent: 0.01 %
Brady Statistic AS VP Percent: 14.21 %
Brady Statistic AS VS Percent: 0.04 %
Brady Statistic RA Percent Paced: 79.55 %
Brady Statistic RV Percent Paced: 99.92 %
Date Time Interrogation Session: 20211005115651
Implantable Lead Implant Date: 20180214
Implantable Lead Implant Date: 20180214
Implantable Lead Location: 753859
Implantable Lead Location: 753860
Implantable Lead Model: 5076
Implantable Lead Model: 5076
Implantable Pulse Generator Implant Date: 20180214
Lead Channel Impedance Value: 304 Ohm
Lead Channel Impedance Value: 380 Ohm
Lead Channel Impedance Value: 380 Ohm
Lead Channel Impedance Value: 475 Ohm
Lead Channel Pacing Threshold Amplitude: 0.5 V
Lead Channel Pacing Threshold Amplitude: 1 V
Lead Channel Pacing Threshold Pulse Width: 0.4 ms
Lead Channel Pacing Threshold Pulse Width: 0.4 ms
Lead Channel Sensing Intrinsic Amplitude: 12.125 mV
Lead Channel Sensing Intrinsic Amplitude: 12.125 mV
Lead Channel Sensing Intrinsic Amplitude: 3.125 mV
Lead Channel Sensing Intrinsic Amplitude: 4 mV
Lead Channel Setting Pacing Amplitude: 2 V
Lead Channel Setting Pacing Amplitude: 2.5 V
Lead Channel Setting Pacing Pulse Width: 0.4 ms
Lead Channel Setting Sensing Sensitivity: 0.9 mV

## 2020-02-08 LAB — PACEMAKER DEVICE OBSERVATION

## 2020-02-08 LAB — LIPID PANEL
Chol/HDL Ratio: 2.6 ratio (ref 0.0–5.0)
Cholesterol, Total: 140 mg/dL (ref 100–199)
HDL: 53 mg/dL (ref >39)
LDL Chol Calc (NIH): 76 mg/dL (ref 0–99)
Triglycerides: 47 mg/dL (ref 0–149)
VLDL Cholesterol Cal: 11 mg/dL (ref 5–40)

## 2020-02-08 MED ORDER — DOFETILIDE 125 MCG PO CAPS: ORAL_CAPSULE | ORAL | 1 refills | Status: DC

## 2020-02-08 NOTE — Patient Instructions (Signed)
Medication Instructions:  - Your physician recommends that you continue on your current medications as directed. Please refer to the Current Medication list given to you today.  *If you need a refill on your cardiac medications before your next appointment, please call your pharmacy*   Lab Work: - none ordered  If you have labs (blood work) drawn today and your tests are completely normal, you will receive your results only by: Marland Kitchen MyChart Message (if you have MyChart) OR . A paper copy in the mail If you have any lab test that is abnormal or we need to change your treatment, we will call you to review the results.   Testing/Procedures: - Your physician has requested that you have an echocardiogram. Echocardiography is a painless test that uses sound waves to create images of your heart. It provides your doctor with information about the size and shape of your heart and how well your heart's chambers and valves are working. This procedure takes approximately one hour. There are no restrictions for this procedure. There is a possibility that an IV may need to be started during your test to inject an image enhancing agent. This is done to obtain more optimal pictures of your heart. Therefore we ask that you do at least drink some water prior to coming in to hydrate your veins.   Note: if you arrive 10 minutes past your appointment time, your appointment may need to be rescheduled   Follow-Up: At [facility], you and your health needs are our priority.  As part of our continuing mission to provide you with exceptional heart care, we have created designated Provider Care Teams.  These Care Teams include your primary Cardiologist (physician) and Advanced Practice Providers (APPs -  Physician Assistants and Nurse Practitioners) who all work together to provide you with the care you need, when you need it.  We recommend signing up for the patient portal called "MyChart".  Sign up information is  provided on this After Visit Summary.  MyChart is used to connect with patients for Virtual Visits (Telemedicine).  Patients are able to view lab/test results, encounter notes, upcoming appointments, etc.  Non-urgent messages can be sent to your provider as well.   To learn more about what you can do with MyChart, go to [email or web address].    Your next appointment:   6 month(s)  The format for your next appointment:   In Person  Provider:   Virl Axe, MD   Other Instructions n/a

## 2020-02-08 NOTE — Progress Notes (Signed)
Patient Care Team: Jerrol Banana., MD as PCP - General (Family Medicine) Rockey Situ Kathlene November, MD as Consulting Physician (Cardiology) Deboraha Sprang, MD as Consulting Physician (Cardiology) Leandrew Koyanagi, MD as Referring Physician (Ophthalmology) Wellington Hampshire, MD as Consulting Physician (Cardiology) Abbie Sons, MD (Urology) Emmaline Kluver., MD (Rheumatology) Ralene Bathe, MD (Dermatology)   HPI  Thomas [name] is a 86 y.o. male Seen in follow-up for pacemaker implanted 2/18 for complete heart block. He also has paroxysmal atrial flutter and fibrillation on dofetilide and anticoagulation with dofetilide.  Thinking backHe is not sure that he has symptoms from his afib and the significant clinical improvement noted after dofetilide might have been related to the serendipitously identified pneumonia  he has questions about ablation and watchman  He is still able to run 3-4 miles a day.  Working on the farm.    DATE TEST EF   2/18 Echo   55-60 % Reportedly interval normlization  6/19 LHC   3 V CAD moderate>>medical Rx   10/20 Echo  45.-50% LA size is better       Date Cr K Mg Hgb  6/18 1.46   14.4  5/19 1.47 4.3 2.0 16  10/19 1.55 4.5  16.8  1/20 1.47 4.6  16.5  9/20 1.64 4.7 2.2 16.0  10/21 1.45 4.4  32.4    Thromboembolic risk factors ( age -63 , HTN-1 , TIA/CVA-2, Vasc disease -1) for a CHADSVASc Score of >=6   Past Medical History:  Diagnosis Date  . Aortic valve disorders   . Arthritis   . Atrial flutter ([place]) 06/18/2016   "AF or AFl; not sure which" (06/23/2016)  . Basal cell carcinoma    "face, nose left shoulder, left arm" (06/19/2016)  . BBB (bundle branch block)    hx right  . Chronic back pain    "neck, thoracic, lower back" (06/19/2016)  . Complete heart block ([place]) 06/2016  . Dyspnea   . GERD (gastroesophageal reflux disease)   . Gout   . Heart block    "I've had type I, II Wenke before now" (06/19/2016)  .  History of gout   . History of hiatal hernia    "self dx'd" (06/19/2016)  . Hyperlipidemia   . Hypertension   . Lyme disease    "dx'd by me 2003; cx's showed dx 08/2015"  . Migraine    "3-4/year" (06/19/2016)  . Presence of permanent cardiac pacemaker 06/19/2016  . PVC's (premature ventricular contractions)   . Renal cancer, left ([place]) 2006   S/P cryotherapy  . Spinal stenosis    "cervical, 1 thoracic, lumbar" (06/19/2016)  . Squamous carcinoma    "face, nose left shoulder, left arm" (06/19/2016)  . Stroke ([place])   . TIA (transient ischemic attack) 06/14/2016   "I'm not sure that's what it was" (06/25/2016)  . Visit for monitoring Tikosyn therapy 09/09/2017    Past Surgical History:  Procedure Laterality Date  . ANKLE FRACTURE SURGERY Right 1967  . BASAL CELL CARCINOMA EXCISION     "face, nose left shoulder, left arm"  . BIOPSY PROSTATE  2001 & 2003  . CARDIAC CATHETERIZATION  1990's  . CARDIOVERSION N/A 09/11/2017   Procedure: CARDIOVERSION;  Surgeon: Lelon Perla, MD;  Location: [facility] ENDOSCOPY;  Service: Cardiovascular;  Laterality: N/A;  . FRACTURE SURGERY    . INGUINAL HERNIA REPAIR Left 2012  . INSERT / REPLACE / REMOVE PACEMAKER  06/19/2016  .  LAPAROSCOPIC ABLATION RENAL MASS    . LEFT HEART CATH AND CORONARY ANGIOGRAPHY Left 10/23/2017   Procedure: LEFT HEART CATH AND CORONARY ANGIOGRAPHY;  Surgeon: Wellington Hampshire, MD;  Location: [facility];  Service: Cardiovascular;  Laterality: Left;  . PACEMAKER IMPLANT N/A 06/19/2016   Procedure: Pacemaker Implant;  Surgeon: Deboraha Sprang, MD;  Location: [facility];  Service: Cardiovascular;  Laterality: N/A;  . pacemasker    . PROSTATE SURGERY    . SQUAMOUS CELL CARCINOMA EXCISION     "face, nose left shoulder, left arm"  . TONSILLECTOMY AND ADENOIDECTOMY      Current Outpatient Medications  Medication Sig Dispense Refill  . allopurinol (ZYLOPRIM) 300 MG tablet Take 300 mg by mouth daily.    Marland Kitchen amLODipine  (NORVASC) 5 MG tablet TAKE 1 TABLET BY MOUTH DAILY 39 tablet 0  . apixaban (ELIQUIS) 5 MG TABS tablet Take 1 tablet (5 mg total) by mouth 2 (two) times daily. 60 tablet 5  . atorvastatin (LIPITOR) 40 MG tablet TAKE 1 TABLET BY MOUTH DAILY AT 6 PM 30 tablet 5  . cefdinir (OMNICEF) 300 MG capsule Take 1 capsule (300 mg total) by mouth daily. 30 capsule 5  . Cholecalciferol (VITAMIN D3) 5000 units CAPS Take 5,000 Units by mouth daily.    . Coenzyme Q10 (COQ10) 100 MG CAPS Take 100 mg by mouth daily.    . colchicine (COLCRYS) 0.6 MG tablet Take 1 tablet (0.6 mg total) by mouth daily as needed (for gout). 30 tablet 5  . diclofenac sodium (VOLTAREN) 1 % GEL Apply 2 g topically daily as needed (for pain).     Marland Kitchen dofetilide (TIKOSYN) 125 MCG capsule TAKE 1 CAPSULE BY MOUTH 2 TIMES DAILY 60 capsule 0  . doxycycline (VIBRA-TABS) 100 MG tablet Take 1 tablet (100 mg total) by mouth daily. 30 tablet 5  . dutasteride (AVODART) 0.5 MG capsule Take 0.5 mg by mouth daily as needed (for urination).     Marland Kitchen esomeprazole (NEXIUM) 40 MG capsule Take 40 mg by mouth daily as needed (for heartburn).     . hydrALAZINE (APRESOLINE) 25 MG tablet Take 1 tablet (25 mg total) by mouth 3 (three) times daily as needed. (Patient taking differently: Take 25 mg by mouth 3 (three) times daily as needed (for BP > 150). ) 90 tablet 6  . metoprolol succinate (TOPROL XL) 50 MG 24 hr tablet Take 1 tablet (50 mg total) by mouth daily. Take with or immediately following a meal. Please schedule office visit before any future refills. 90 tablet 0  . metroNIDAZOLE (FLAGYL) 500 MG tablet TAKE 1 TABLET BY MOUTH TWICE DAILY ON SUNDAY AND MONDAY 30 tablet 11  . Multiple Vitamins-Minerals (CENTRUM SILVER 50+MEN PO) Take 1 tablet by mouth daily.    . NON FORMULARY Testosterone gel compound - apply 1 cc per day    . propranolol (INDERAL) 40 MG tablet TAKE ONE (1) TABLET THREE (3) TIMES EACHDAY AS NEEDED (Patient taking differently: TAKE ONE (1) TABLET  THREE (3) TIMES daily AS NEEDED for afib) 270 tablet 1  . silodosin (RAPAFLO) 8 MG CAPS capsule Take 8 mg by mouth daily as needed (for urintation).     . temazepam (RESTORIL) 30 MG capsule Take 30 mg by mouth at bedtime as needed.     . terazosin (HYTRIN) 1 MG capsule Take 1 mg by mouth at bedtime.    . terbinafine (LAMISIL) 250 MG tablet Take 250 mg by mouth daily  as needed.     . Testosterone 1.62 % GEL APPLY 2 PUMP DAILY 75 g 3  . testosterone cypionate (DEPOTESTOSTERONE CYPIONATE) 200 MG/ML injection Inject 1 mL (200 mg total) into the muscle every 28 (twenty-eight) days. 10 mL 0  . traMADol (ULTRAM) 50 MG tablet Take 50 mg by mouth every 6 (six) hours as needed for moderate pain.     . Turmeric 500 MG CAPS Take 500 mg by mouth daily.    . vitamin E 400 UNIT capsule Take 400 Units by mouth daily.     No current facility-administered medications for this visit.    Allergies  Allergen Reactions  . Iodine Hives and Other (See Comments)    IVP contrast  . Penicillins Itching, Rash and Other (See Comments)    ITCHY FEELING IN FINGERS Has patient had a PCN reaction causing immediate rash, facial/tongue/throat swelling, SOB or lightheadedness with hypotension: No Has patient had a PCN reaction causing severe rash involving mucus membranes or skin necrosis: No Has patient had a PCN reaction that required hospitalization No Has patient had a PCN reaction occurring within the last 10 years: No If all of the above answers are "NO", then may proceed with Cephalosporin use.       Review of Systems negative except from HPI and PMH  Physical Exam BP 118/80   Pulse 65   Ht 5\' 7"  (1.702 m)   Wt 167 lb (75.8 kg)   BMI 26.16 kg/m  Well developed and well nourished in no acute distress HENT normal Neck supple with JVP-flat Clear Device pocket well healed; without hematoma or erythema.  There is no tethering  Regular rate and rhythm, no  murmur Abd-soft with active BS No Clubbing  cyanosis   edema Skin-warm and dry A & Oriented  Grossly normal sensory and motor function  ECG AV pacing with an upright QRS lead V1 and lead I  Assessment and Plan:  Atrial fibrillation-persistent  TIA  Coronary artery disease- mod medical therapy  Sinus node dysfunction/chronotropic incompetence  Complete heart block    Hypertension  High Risk Medication Surveillance-Dofetilide  Pacemaker Medtronic   Renal insufficiency grade 3  Sleep disordered breathing >>sleep study negative  Cardiomyopathy-mild   Interested in thinking about ablation.  Concerned about the cost of dofetilide.  The issue for both is symptoms attributable to atrial fibrillation.  He is not sure that he has any.  As noted above, there are some concerns initially because of concomitant pneumonia as to whether it could have been causing his shortness of breath.  I have asked him to review this with his wife.  We will look at a left atrial dimension to see if there are any options in this regard.  With his prior TIA, I do not think that anticoagulation discontinuation makes any sense regardless of ablation outcome.  Moreover, I am not a strong proponent of watchman in the absence of clinical bleeding particularly when using a NOAC.  Blood pressures well controlled  We will reassess left ventricular function at the time of echo

## 2020-02-08 NOTE — Telephone Encounter (Signed)
This is a [place] pt 

## 2020-02-11 ENCOUNTER — Other Ambulatory Visit: Payer: Self-pay | Admitting: Family Medicine

## 2020-02-11 NOTE — Telephone Encounter (Signed)
Requested Prescriptions  Pending Prescriptions Disp Refills  . amLODipine (NORVASC) 5 MG tablet [Pharmacy Med Name: AMLODIPINE BESYLATE 5 MG TAB] 90 tablet 1    Sig: TAKE 1 TABLET BY MOUTH DAILY     Cardiovascular:  Calcium Channel Blockers Passed - 02/11/2020 10:22 AM      Passed - Last BP in normal range    BP Readings from Last 1 Encounters:  02/08/20 118/80         Passed - Valid encounter within last 6 months    Recent Outpatient Visits          1 week ago Coronary artery disease involving native coronary artery of native heart without angina pectoris   [facility] Jerrol Banana., MD   2 months ago Viral upper respiratory tract infection   [facility] Jerrol Banana., MD   1 year ago Chest pain, unspecified type   [facility] Jerrol Banana., MD   1 year ago Gout, unspecified cause, unspecified chronicity, unspecified site   [facility] Jerrol Banana., MD   2 years ago Atrial fibrillation, unspecified type [facility])   [facility] Jerrol Banana., MD      Future Appointments            In 5 days Gilford Rile, Martie Lee, NP [facility] [place]

## 2020-02-16 ENCOUNTER — Ambulatory Visit: Payer: Medicare Other | Admitting: Family

## 2020-03-07 ENCOUNTER — Other Ambulatory Visit: Payer: Self-pay

## 2020-03-07 ENCOUNTER — Ambulatory Visit (INDEPENDENT_AMBULATORY_CARE_PROVIDER_SITE_OTHER): Payer: Medicare Other

## 2020-03-07 DIAGNOSIS — I4819 Other persistent atrial fibrillation: Secondary | ICD-10-CM

## 2020-03-09 LAB — ECHOCARDIOGRAM COMPLETE
Area-P 1/2: 3.76 cm2
P 1/2 time: 505 msec
S' Lateral: 3.05 cm

## 2020-03-14 ENCOUNTER — Encounter: Payer: Self-pay | Admitting: Family Medicine

## 2020-03-14 DIAGNOSIS — Z79899 Other long term (current) drug therapy: Secondary | ICD-10-CM | POA: Diagnosis not present

## 2020-03-14 DIAGNOSIS — M1A00X0 Idiopathic chronic gout, unspecified site, without tophus (tophi): Secondary | ICD-10-CM | POA: Diagnosis not present

## 2020-03-14 MED ORDER — TERAZOSIN HCL 1 MG PO CAPS
1.0000 mg | ORAL_CAPSULE | Freq: Every day | ORAL | 3 refills | Status: DC
Start: 2020-03-14 — End: 2020-05-11

## 2020-03-15 ENCOUNTER — Other Ambulatory Visit: Payer: Self-pay | Admitting: Family Medicine

## 2020-03-15 ENCOUNTER — Telehealth: Payer: Self-pay | Admitting: Family Medicine

## 2020-03-15 MED ORDER — METRONIDAZOLE 500 MG PO TABS
ORAL_TABLET | ORAL | 3 refills | Status: DC
Start: 2020-03-15 — End: 2021-11-23

## 2020-03-15 NOTE — Telephone Encounter (Signed)
OK for 90.,3rf. thx

## 2020-03-15 NOTE — Telephone Encounter (Signed)
[facility] faxed refill request for the following medications:  Requesting 90 day metroNIDAZOLE (FLAGYL) 500 MG tablet   Please advise. Thanks, [employer]

## 2020-03-15 NOTE — Telephone Encounter (Signed)
Requested medication (s) are due for refill today - expired Rx  Requested medication (s) are on the active medication list -yes  Future visit scheduled -yes  Last refill: 11/24/19  Notes to clinic: Request RF- medication not assigned to protocol  Requested Prescriptions  Pending Prescriptions Disp Refills   metroNIDAZOLE (FLAGYL) 500 MG tablet [Pharmacy Med Name: METRONIDAZOLE 500 MG TAB] 30 tablet 11    Sig: TAKE 1 TABLET BY MOUTH TWICE DAILY ON SUNDAY AND MONDAY      Off-Protocol Failed - 03/15/2020 12:52 PM      Failed - Medication not assigned to a protocol, review manually.      Passed - Valid encounter within last 12 months    Recent Outpatient Visits           1 month ago Coronary artery disease involving native coronary artery of native heart without angina pectoris   [facility] Jerrol Banana., MD   3 months ago Viral upper respiratory tract infection   [facility] Jerrol Banana., MD   1 year ago Chest pain, unspecified type   [facility] Jerrol Banana., MD   2 years ago Gout, unspecified cause, unspecified chronicity, unspecified site   [facility] Jerrol Banana., MD   2 years ago Atrial fibrillation, unspecified type [facility])   [facility] Jerrol Banana., MD                  Requested Prescriptions  Pending Prescriptions Disp Refills   metroNIDAZOLE (FLAGYL) 500 MG tablet [Pharmacy Med Name: METRONIDAZOLE 500 MG TAB] 30 tablet 11    Sig: TAKE 1 TABLET BY MOUTH TWICE DAILY ON SUNDAY AND MONDAY      Off-Protocol Failed - 03/15/2020 12:52 PM      Failed - Medication not assigned to a protocol, review manually.      Passed - Valid encounter within last 12 months    Recent Outpatient Visits           1 month ago Coronary artery disease involving native coronary artery of native heart without angina pectoris   [facility] Jerrol Banana., MD   3 months ago Viral upper respiratory tract infection   [facility] Jerrol Banana., MD   1 year ago Chest pain, unspecified type   [facility] Jerrol Banana., MD   2 years ago Gout, unspecified cause, unspecified chronicity, unspecified site   [facility] Jerrol Banana., MD   2 years ago Atrial fibrillation, unspecified type [facility])   [facility] Jerrol Banana., MD

## 2020-03-15 NOTE — Telephone Encounter (Addendum)
Please advise refill for Flagyl. Is this right? Someone sent refill already to pharmacy for #30 with 11 refills.

## 2020-03-20 ENCOUNTER — Other Ambulatory Visit: Payer: Self-pay | Admitting: Family Medicine

## 2020-03-20 NOTE — Telephone Encounter (Signed)
Requested medication (s) are due for refill today: yes  Requested medication (s) are on the active medication list: yes  Last refill:  02/17/2018 #30 5 refills  Future visit scheduled: no  Notes to clinic:  expired medication. Patient last seen 1 month ago . Last lab 02/24/2018, Do you want to renew Rx?     Requested Prescriptions  Pending Prescriptions Disp Refills   colchicine 0.6 MG tablet [Pharmacy Med Name: COLCHICINE 0.6 MG TAB] 30 tablet 5    Sig: TAKE 1 TABLET BY MOUTH DAILY AS NEEDED FOR GOUT      Endocrinology:  Gout Agents Failed - 03/20/2020  1:08 PM      Failed - Uric Acid in normal range and within 360 days    Uric Acid  Date Value Ref Range Status  02/24/2018 6.3 3.7 - 8.6 mg/dL Final    Comment:               Therapeutic target for gout patients: <6.0          Failed - Cr in normal range and within 360 days    Creatinine  Date Value Ref Range Status  06/26/2012 1.31 (H) 0.60 - 1.30 mg/dL Final   Creatinine, Ser  Date Value Ref Range Status  02/07/2020 1.45 (H) 0.76 - 1.27 mg/dL Final          Passed - Valid encounter within last 12 months    Recent Outpatient Visits           1 month ago Coronary artery disease involving native coronary artery of native heart without angina pectoris   [facility] Jerrol Banana., MD   3 months ago Viral upper respiratory tract infection   [facility] Jerrol Banana., MD   1 year ago Chest pain, unspecified type   [facility] Jerrol Banana., MD   2 years ago Gout, unspecified cause, unspecified chronicity, unspecified site   [facility] Jerrol Banana., MD   2 years ago Atrial fibrillation, unspecified type [facility])   [facility] Jerrol Banana., MD

## 2020-03-28 ENCOUNTER — Telehealth: Payer: Self-pay | Admitting: Internal Medicine

## 2020-03-28 DIAGNOSIS — I4819 Other persistent atrial fibrillation: Secondary | ICD-10-CM

## 2020-03-28 NOTE — Telephone Encounter (Signed)
Thomas Sprang, MD  03/25/2020 10:15 AM EST     Please Inform Patient Echo showed  stable heart muscle function at 45-50 % with interval normalization of LA size This is great

## 2020-03-28 NOTE — Telephone Encounter (Signed)
I spoke further with Dr. Caryl Comes regarding the patient's echo report and next steps for the patient. Per Dr. Caryl Comes, with normalization of the patient's LA, he would be a possible candidate for a-fib ablation.  Ok to offer the patient an consultation appointment with Dr. Quentin Ore.  I have spoken with the patient and notified him of his echo results and Dr. Olin Pia recommendation to sit down with Dr. Quentin Ore and discuss possible AF ablation.  The patient is agreeable with this consultation. He is scheduled to see Dr. Quentin Ore on 04/18/20 at 10:20 am in the [place] office. The patient voices understanding and is agreeable.

## 2020-03-29 ENCOUNTER — Other Ambulatory Visit: Payer: Self-pay | Admitting: Internal Medicine

## 2020-03-29 NOTE — Telephone Encounter (Signed)
Rx request sent to pharmacy.  

## 2020-03-29 NOTE — Telephone Encounter (Signed)
This is a [place] pt 

## 2020-04-18 ENCOUNTER — Other Ambulatory Visit: Payer: Self-pay

## 2020-04-18 ENCOUNTER — Institutional Professional Consult (permissible substitution): Payer: Medicare Other | Admitting: Cardiology

## 2020-04-18 ENCOUNTER — Encounter: Payer: Self-pay | Admitting: Cardiology

## 2020-04-18 ENCOUNTER — Ambulatory Visit (INDEPENDENT_AMBULATORY_CARE_PROVIDER_SITE_OTHER): Payer: Medicare Other | Admitting: Cardiology

## 2020-04-18 ENCOUNTER — Other Ambulatory Visit: Payer: Self-pay | Admitting: Family Medicine

## 2020-04-18 VITALS — BP 116/76 | HR 68 | Ht 67.0 in | Wt 169.8 lb

## 2020-04-18 DIAGNOSIS — I10 Essential (primary) hypertension: Secondary | ICD-10-CM

## 2020-04-18 DIAGNOSIS — Z950 Presence of cardiac pacemaker: Secondary | ICD-10-CM

## 2020-04-18 DIAGNOSIS — I4819 Other persistent atrial fibrillation: Secondary | ICD-10-CM | POA: Diagnosis not present

## 2020-04-18 DIAGNOSIS — I442 Atrioventricular block, complete: Secondary | ICD-10-CM | POA: Diagnosis not present

## 2020-04-18 LAB — PACEMAKER DEVICE OBSERVATION

## 2020-04-18 NOTE — Patient Instructions (Signed)
Medication Instructions:  Your physician recommends that you continue on your current medications as directed. Please refer to the Current Medication list given to you today.  *If you need a refill on your cardiac medications before your next appointment, please call your pharmacy*   Lab Work: None ordered.  If you have labs (blood work) drawn today and your tests are completely normal, you will receive your results only by: MyChart Message (if you have MyChart) OR A paper copy in the mail If you have any lab test that is abnormal or we need to change your treatment, we will call you to review the results.   Testing/Procedures: None ordered.    Follow-Up: At [facility], you and your health needs are our priority.  As part of our continuing mission to provide you with exceptional heart care, we have created designated Provider Care Teams.  These Care Teams include your primary Cardiologist (physician) and Advanced Practice Providers (APPs -  Physician Assistants and Nurse Practitioners) who all work together to provide you with the care you need, when you need it.  We recommend signing up for the patient portal called "MyChart".  Sign up information is provided on this After Visit Summary.  MyChart is used to connect with patients for Virtual Visits (Telemedicine).  Patients are able to view lab/test results, encounter notes, upcoming appointments, etc.  Non-urgent messages can be sent to your provider as well.   To learn more about what you can do with MyChart, go to [email or web address].    Your next appointment:   Follow up with Dr Lambert as needed 

## 2020-04-18 NOTE — Progress Notes (Signed)
Electrophysiology Office Note:    Date:  04/18/2020   ID:  [name], DOB 1933/02/13, MRN [id number]  PCP:  Jerrol Banana., MD  [facility] Cardiologist:  No primary care provider on file.  [facility] Electrophysiologist:  None   Referring MD: Deboraha Sprang, MD   Chief Complaint: Atrial fibrillation  History of Present Illness:    Thomas [name] is a 86 y.o. male who presents for an evaluation of atrial fibrillation at the request of Dr. Jolyn Nap. Their medical history includes paroxysmal atrial fibrillation currently on dofetilide, complete heart block post permanent pacemaker, hypertension, hyperlipidemia, chronic Lyme disease.   The patient was last seen by Dr. Jolyn Nap on February 08, 2020.  At that appointment, there is discussion whether or not his atrial fibrillation was symptomatic.  He is currently maintained on dofetilide 125 mcg twice daily.  Despite therapy with dofetilide, pacemaker interrogation has shown a recent increase in his atrial fibrillation burden.  Despite this increase, the patient reports no symptoms and has an excellent exercise capacity.  I was asked to see the patient today to discuss possible treatment options for his atrial fibrillation including ablation therapy.     Past Medical History:  Diagnosis Date  . Aortic valve disorders   . Arthritis   . Atrial flutter ([place]) 06/18/2016   "AF or AFl; not sure which" (06/23/2016)  . Basal cell carcinoma    "face, nose left shoulder, left arm" (06/19/2016)  . BBB (bundle branch block)    hx right  . Chronic back pain    "neck, thoracic, lower back" (06/19/2016)  . Complete heart block ([place]) 06/2016  . Dyspnea   . GERD (gastroesophageal reflux disease)   . Gout   . Heart block    "I've had type I, II [place] before now" (06/19/2016)  . History of gout   . History of hiatal hernia    "self dx'd" (06/19/2016)  . Hyperlipidemia   . Hypertension   . Lyme disease    "dx'd by me 2003;  cx's showed dx 08/2015"  . Migraine    "3-4/year" (06/19/2016)  . Presence of permanent cardiac pacemaker 06/19/2016  . PVC's (premature ventricular contractions)   . Renal cancer, left ([place]) 2006   S/P cryotherapy  . Spinal stenosis    "cervical, 1 thoracic, lumbar" (06/19/2016)  . Squamous carcinoma    "face, nose left shoulder, left arm" (06/19/2016)  . Stroke ([place])   . TIA (transient ischemic attack) 06/14/2016   "I'm not sure that's what it was" (06/25/2016)  . Visit for monitoring Tikosyn therapy 09/09/2017    Past Surgical History:  Procedure Laterality Date  . ANKLE FRACTURE SURGERY Right 1967  . BASAL CELL CARCINOMA EXCISION     "face, nose left shoulder, left arm"  . BIOPSY PROSTATE  2001 & 2003  . CARDIAC CATHETERIZATION  1990's  . CARDIOVERSION N/A 09/11/2017   Procedure: CARDIOVERSION;  Surgeon: Lelon Perla, MD;  Location[facility] ENDOSCOPY;  Service: Cardiovascular;  Laterality: N/A;  . FRACTURE SURGERY    . INGUINAL HERNIA REPAIR Left 2012  . INSERT / REPLACE / REMOVE PACEMAKER  06/19/2016  . LAPAROSCOPIC ABLATION RENAL MASS    . LEFT HEART CATH AND CORONARY ANGIOGRAPHY Left 10/23/2017   Procedure: LEFT HEART CATH AND CORONARY ANGIOGRAPHY;  Surgeon: Wellington Hampshire, MD;  Location: [place] CV LAB;  Service: Cardiovascular;  Laterality: Left;  . PACEMAKER IMPLANT N/A 06/19/2016   Procedure: Pacemaker Implant;  Surgeon: Deboraha Sprang, MD;  Location: [place] CV LAB;  Service: Cardiovascular;  Laterality: N/A;  . pacemasker    . PROSTATE SURGERY    . SQUAMOUS CELL CARCINOMA EXCISION     "face, nose left shoulder, left arm"  . TONSILLECTOMY AND ADENOIDECTOMY      Current Medications: Current Meds  Medication Sig  . allopurinol (ZYLOPRIM) 100 MG tablet Take 100 mg by mouth daily. Total 400mg   . allopurinol (ZYLOPRIM) 300 MG tablet Take 300 mg by mouth daily.  Marland Kitchen amLODipine (NORVASC) 5 MG tablet TAKE 1 TABLET BY MOUTH DAILY  . apixaban (ELIQUIS) 5 MG TABS  tablet Take 1 tablet (5 mg total) by mouth 2 (two) times daily.  Marland Kitchen atorvastatin (LIPITOR) 40 MG tablet TAKE 1 TABLET BY MOUTH DAILY AT 6 PM  . cefdinir (OMNICEF) 300 MG capsule Take 1 capsule (300 mg total) by mouth daily.  . celecoxib (CELEBREX) 200 MG capsule Take by mouth 2 (two) times daily.  . Cholecalciferol (VITAMIN D3) 5000 units CAPS Take 5,000 Units by mouth daily.  . Coenzyme Q10 (COQ10) 100 MG CAPS Take 100 mg by mouth daily.  . colchicine 0.6 MG tablet TAKE 1 TABLET BY MOUTH DAILY AS NEEDED FOR GOUT  . diclofenac sodium (VOLTAREN) 1 % GEL Apply 2 g topically daily as needed (for pain).   Marland Kitchen dofetilide (TIKOSYN) 125 MCG capsule TAKE 1 CAPSULE BY MOUTH 2 TIMES DAILY  . doxycycline (VIBRA-TABS) 100 MG tablet Take 1 tablet (100 mg total) by mouth daily.  Marland Kitchen dutasteride (AVODART) 0.5 MG capsule Take 0.5 mg by mouth daily as needed (for urination).   Marland Kitchen esomeprazole (NEXIUM) 40 MG capsule Take 40 mg by mouth daily as needed (for heartburn).   . hydrALAZINE (APRESOLINE) 25 MG tablet Take 1 tablet (25 mg total) by mouth 3 (three) times daily as needed.  . metoprolol succinate (TOPROL-XL) 50 MG 24 hr tablet TAKE 1 TABLET BY MOUTH DAILY WITH OR IMMEDIATELY FOLLOWING A MEAL  . metroNIDAZOLE (FLAGYL) 500 MG tablet TAKE 1 TABLET BY MOUTH TWICE DAILY ON SUNDAY AND MONDAY  . Multiple Vitamins-Minerals (CENTRUM SILVER 50+MEN PO) Take 1 tablet by mouth daily.  . NON FORMULARY Testosterone gel compound - apply 1 cc per day  . propranolol (INDERAL) 40 MG tablet TAKE ONE (1) TABLET THREE (3) TIMES EACHDAY AS NEEDED  . silodosin (RAPAFLO) 8 MG CAPS capsule Take 8 mg by mouth daily as needed (for urintation).   . temazepam (RESTORIL) 30 MG capsule Take 30 mg by mouth at bedtime as needed.   . terazosin (HYTRIN) 1 MG capsule Take 1 capsule (1 mg total) by mouth at bedtime.  . terbinafine (LAMISIL) 250 MG tablet Take 250 mg by mouth daily as needed.   . Testosterone 1.62 % GEL APPLY 2 PUMP DAILY  .  testosterone cypionate (DEPOTESTOSTERONE CYPIONATE) 200 MG/ML injection Inject 1 mL (200 mg total) into the muscle every 28 (twenty-eight) days.  . traMADol (ULTRAM) 50 MG tablet Take 50 mg by mouth every 6 (six) hours as needed for moderate pain.   . Turmeric 500 MG CAPS Take 500 mg by mouth daily.  . vitamin E 400 UNIT capsule Take 400 Units by mouth daily.     Allergies:   Iodine and Penicillins   Social History   Socioeconomic History  . Marital status: Married    Spouse name: Not on file  . Number of children: 3  . Years of education: MD   . Highest education  level: Master's degree (e.g., MA, MS, MEng, MEd, MSW, MBA)  Occupational History  . Occupation: Retired   Tobacco Use  . Smoking status: Never Smoker  . Smokeless tobacco: Never Used  Vaping Use  . Vaping Use: Never used  Substance and Sexual Activity  . Alcohol use: Yes    Alcohol/week: 1.0 standard drink    Types: 1 Glasses of wine per week  . Drug use: No  . Sexual activity: Yes  Other Topics Concern  . Not on file  Social History Narrative   Independent at baseline. Lives with family   Drinks tea in the morning    Social Determinants of Health   Financial Resource Strain: Low Risk   . Difficulty of Paying Living Expenses: Not hard at all  Food Insecurity: No Food Insecurity  . Worried About Charity fundraiser in the Last Year: Never true  . Ran Out of Food in the Last Year: Never true  Transportation Needs: No Transportation Needs  . Lack of Transportation (Medical): No  . Lack of Transportation (Non-Medical): No  Physical Activity: Sufficiently Active  . Days of Exercise per Week: 4 days  . Minutes of Exercise per Session: 50 min  Stress: No Stress Concern Present  . Feeling of Stress : Not at all  Social Connections: Moderately Integrated  . Frequency of Communication with Friends and Family: Twice a week  . Frequency of Social Gatherings with Friends and Family: Twice a week  . Attends Religious  Services: Never  . Active Member of Clubs or Organizations: Yes  . Attends Archivist Meetings: More than 4 times per year  . Marital Status: Married     Family History: The patient's family history includes Aortic stenosis in his mother; Arthritis in his father; Heart attack in his brother; Stroke in his brother.  ROS:   Please see the history of present illness.    All other systems reviewed and are negative.  EKGs/Labs/Other Studies Reviewed:    The following studies were reviewed today: Prior notes, November echo, device interrogation  March 07, 2020 echo personally reviewed Left ventricular function low normal, 50% Elevated left atrial pressure Normal right ventricular function Mild biatrial dilation No significant valvular abnormalities  EKG:  The ekg ordered today demonstrates AV sequential pacing.  QRS duration 185 ms.  April 18, 2020 in person device interrogation Lead parameters stable Device longevity estimated at 6.6 years Increasing atrial fibrillation burden noted since May 2021 Ventricular paced 100% the time Atrially paced 75% of the time  Recent Labs: 02/07/2020: ALT 16; BUN 20; Creatinine, Ser 1.45; Hemoglobin 15.6; Platelets 171; Potassium 4.4; Sodium 137; TSH 3.060  Recent Lipid Panel    Component Value Date/Time   CHOL 140 02/07/2020 0958   CHOL 144 06/26/2012 1106   TRIG 47 02/07/2020 0958   TRIG 46 06/26/2012 1106   HDL 53 02/07/2020 0958   HDL 61 (H) 06/26/2012 1106   CHOLHDL 2.6 02/07/2020 0958   CHOLHDL 2.4 06/14/2016 1346   VLDL 8 06/14/2016 1346   VLDL 9 06/26/2012 1106   LDLCALC 76 02/07/2020 0958   LDLCALC 74 06/26/2012 1106    Physical Exam:    VS:  BP 116/76   Pulse 68   Ht 5\' 7"  (1.702 m)   Wt 169 lb 12.8 oz (77 kg)   SpO2 95%   BMI 26.59 kg/m     Wt Readings from Last 3 Encounters:  04/18/20 169 lb 12.8 oz (77 kg)  02/08/20 167 lb (75.8 kg)  02/03/20 168 lb 6.4 oz (76.4 kg)     GEN:  Well nourished,  well developed in no acute distress.  Appears younger than stated age. HEENT: Normal NECK: No JVD; No carotid bruits LYMPHATICS: No lymphadenopathy CARDIAC: RRR, no murmurs, rubs, gallops.  Pacemaker pocket well-healed without pain. RESPIRATORY:  Clear to auscultation without rales, wheezing or rhonchi  ABDOMEN: Soft, non-tender, non-distended MUSCULOSKELETAL:  No edema; No deformity  SKIN: Warm and dry NEUROLOGIC:  Alert and oriented x 3 PSYCHIATRIC:  Normal affect   ASSESSMENT:    1. Persistent atrial fibrillation ([place])   2. Complete heart block ([facility])   3. Cardiac pacemaker in situ   4. Essential hypertension    PLAN:    In order of problems listed above:  1.  Paroxysmal atrial fibrillation The patient has asymptomatic paroxysmal atrial fibrillation currently managed with dofetilide 125 mcg twice daily.  He is using Eliquis 5 mg twice daily for stroke prophylaxis.  The patient has an excellent exercise capacity with overall normal/low normal left ventricular function.  We discussed the management options for his atrial fibrillation including #1 continued conservative management with stroke prophylaxis with Eliquis, #2 changing antiarrhythmic agents or #3 using ablation for rhythm control.    Given the asymptomatic nature of his atrial fibrillation, I do not think that an invasive/ablative approach to rhythm control is warranted.    It appears that his dofetilide is losing effectiveness.  His burden of atrial fibrillation has increased but thankfully his activity level remains stable without clear symptoms from his atrial fibrillation.  If this trend continues with and his atrial fibrillation burden continues to increase, would favor discontinuation of dofetilide with consideration to transitioning to amiodarone to help maintain normal rhythm.  We discussed amiodarone during today's visit including the risks associated with the medication.     2. Complete heart block Patient is  pacemaker dependent.  Likely helping minimize his symptoms from his atrial fibrillation.  3.  Permanent pacemaker in situ Device is well-functioning on today's interrogation.  4.  Hypertension Well-controlled during today's visit.  Continue current regimen including amlodipine, hydralazine, metoprolol, propranolol  Medication Adjustments/Labs and Tests Ordered: Current medicines are reviewed at length with the patient today.  Concerns regarding medicines are outlined above.  Orders Placed This Encounter  Procedures  . EKG 12-Lead   No orders of the defined types were placed in this encounter.    Signed, Lars Mage, MD, [facility]  04/18/2020 5:07 PM    Electrophysiology [place]

## 2020-04-18 NOTE — Telephone Encounter (Signed)
Requested medication (s) are due for refill today:   Provider to determine  Requested medication (s) are on the active medication list:   Yes  Future visit scheduled:   No   Last ordered: 03/08/2019 #30, 5 refills by Dr. Marlyne Beards.    Requested Prescriptions  Pending Prescriptions Disp Refills   atorvastatin (LIPITOR) 40 MG tablet [Pharmacy Med Name: ATORVASTATIN CALCIUM 40 MG TAB] 30 tablet 5    Sig: TAKE 1 TABLET BY MOUTH DAILY AT 6 PM      Cardiovascular:  Antilipid - Statins Failed - 04/18/2020 12:44 PM      Failed - LDL in normal range and within 360 days    Ldl Cholesterol, Calc  Date Value Ref Range Status  06/26/2012 74 0 - 100 mg/dL Final   LDL Chol Calc (NIH)  Date Value Ref Range Status  02/07/2020 76 0 - 99 mg/dL Final          Passed - Total Cholesterol in normal range and within 360 days    Cholesterol, Total  Date Value Ref Range Status  02/07/2020 140 100 - 199 mg/dL Final   Cholesterol  Date Value Ref Range Status  06/26/2012 144 0 - 200 mg/dL Final          Passed - HDL in normal range and within 360 days    HDL Cholesterol  Date Value Ref Range Status  06/26/2012 61 (H) 40 - 60 mg/dL Final   HDL  Date Value Ref Range Status  02/07/2020 53 >39 mg/dL Final          Passed - Triglycerides in normal range and within 360 days    Triglycerides  Date Value Ref Range Status  02/07/2020 47 0 - 149 mg/dL Final  06/26/2012 46 0 - 200 mg/dL Final          Passed - Patient is not pregnant      Passed - Valid encounter within last 12 months    Recent Outpatient Visits           2 months ago Coronary artery disease involving native coronary artery of native heart without angina pectoris   [facility] Jerrol Banana., MD   4 months ago Viral upper respiratory tract infection   [facility] Jerrol Banana., MD   1 year ago Chest pain, unspecified type   [facility] Jerrol Banana., MD   2 years ago Gout, unspecified cause, unspecified chronicity, unspecified site   [facility] Jerrol Banana., MD   2 years ago Atrial fibrillation, unspecified type [facility])   [facility] Jerrol Banana., MD

## 2020-04-20 ENCOUNTER — Other Ambulatory Visit: Payer: Self-pay | Admitting: Urology

## 2020-04-20 DIAGNOSIS — E291 Testicular hypofunction: Secondary | ICD-10-CM

## 2020-04-22 NOTE — Telephone Encounter (Signed)
Please schedule lab visit for testosterone level sometime this month or next month

## 2020-04-24 ENCOUNTER — Encounter: Payer: Self-pay | Admitting: *Deleted

## 2020-04-26 ENCOUNTER — Telehealth: Payer: Self-pay

## 2020-04-26 MED ORDER — VALACYCLOVIR HCL 1 G PO TABS: 1000.0000 mg | ORAL_TABLET | Freq: Three times a day (TID) | ORAL | 0 refills | Status: DC

## 2020-04-26 NOTE — Telephone Encounter (Signed)
Copied from [place] [id number]. Topic: General - Other >> Apr 26, 2020 11:52 AM Leward Quan A wrote: Reason for CRM: Patient called in to inform Dr Rosanna Randy that he has flare ups of Shingles and would like an Rx for Valaciclovir sent to the [facility], [place] Phone:  [phone]  Fax:  [phone]   [phone]

## 2020-04-26 NOTE — Telephone Encounter (Signed)
Valtrex 1 gram TID for 1 week.

## 2020-05-02 ENCOUNTER — Ambulatory Visit (INDEPENDENT_AMBULATORY_CARE_PROVIDER_SITE_OTHER): Payer: Medicare Other

## 2020-05-02 DIAGNOSIS — I442 Atrioventricular block, complete: Secondary | ICD-10-CM

## 2020-05-03 ENCOUNTER — Emergency Department: Payer: Medicare Other

## 2020-05-03 ENCOUNTER — Other Ambulatory Visit: Payer: Self-pay

## 2020-05-03 ENCOUNTER — Encounter: Payer: Self-pay | Admitting: Medical Oncology

## 2020-05-03 ENCOUNTER — Emergency Department
Admission: EM | Admit: 2020-05-03 | Discharge: 2020-05-03 | Disposition: A | Discharge status: Home / Self Care | Payer: Medicare Other | Attending: Emergency Medicine | Admitting: Emergency Medicine

## 2020-05-03 DIAGNOSIS — Z7901 Long term (current) use of anticoagulants: Secondary | ICD-10-CM | POA: Diagnosis not present

## 2020-05-03 DIAGNOSIS — S0012XA Contusion of left eyelid and periocular area, initial encounter: Secondary | ICD-10-CM | POA: Insufficient documentation

## 2020-05-03 DIAGNOSIS — Z20822 Contact with and (suspected) exposure to covid-19: Secondary | ICD-10-CM | POA: Diagnosis not present

## 2020-05-03 DIAGNOSIS — R29898 Other symptoms and signs involving the musculoskeletal system: Secondary | ICD-10-CM

## 2020-05-03 DIAGNOSIS — I672 Cerebral atherosclerosis: Secondary | ICD-10-CM | POA: Diagnosis not present

## 2020-05-03 DIAGNOSIS — Z950 Presence of cardiac pacemaker: Secondary | ICD-10-CM | POA: Diagnosis not present

## 2020-05-03 DIAGNOSIS — M6281 Muscle weakness (generalized): Secondary | ICD-10-CM | POA: Insufficient documentation

## 2020-05-03 DIAGNOSIS — Z79899 Other long term (current) drug therapy: Secondary | ICD-10-CM | POA: Insufficient documentation

## 2020-05-03 DIAGNOSIS — I10 Essential (primary) hypertension: Secondary | ICD-10-CM | POA: Insufficient documentation

## 2020-05-03 DIAGNOSIS — S32012A Unstable burst fracture of first lumbar vertebra, initial encounter for closed fracture: Secondary | ICD-10-CM | POA: Diagnosis not present

## 2020-05-03 DIAGNOSIS — S32011A Stable burst fracture of first lumbar vertebra, initial encounter for closed fracture: Secondary | ICD-10-CM | POA: Diagnosis not present

## 2020-05-03 DIAGNOSIS — Z951 Presence of aortocoronary bypass graft: Secondary | ICD-10-CM | POA: Diagnosis not present

## 2020-05-03 DIAGNOSIS — I6782 Cerebral ischemia: Secondary | ICD-10-CM | POA: Insufficient documentation

## 2020-05-03 DIAGNOSIS — R531 Weakness: Secondary | ICD-10-CM | POA: Diagnosis not present

## 2020-05-03 DIAGNOSIS — M545 Low back pain, unspecified: Secondary | ICD-10-CM | POA: Diagnosis not present

## 2020-05-03 DIAGNOSIS — M47812 Spondylosis without myelopathy or radiculopathy, cervical region: Secondary | ICD-10-CM | POA: Diagnosis not present

## 2020-05-03 DIAGNOSIS — S2239XA Fracture of one rib, unspecified side, initial encounter for closed fracture: Secondary | ICD-10-CM | POA: Diagnosis not present

## 2020-05-03 DIAGNOSIS — Y92410 Unspecified street and highway as the place of occurrence of the external cause: Secondary | ICD-10-CM | POA: Diagnosis not present

## 2020-05-03 DIAGNOSIS — M5134 Other intervertebral disc degeneration, thoracic region: Secondary | ICD-10-CM | POA: Diagnosis not present

## 2020-05-03 DIAGNOSIS — S199XXA Unspecified injury of neck, initial encounter: Secondary | ICD-10-CM | POA: Diagnosis not present

## 2020-05-03 DIAGNOSIS — S32001A Stable burst fracture of unspecified lumbar vertebra, initial encounter for closed fracture: Secondary | ICD-10-CM | POA: Insufficient documentation

## 2020-05-03 DIAGNOSIS — I2510 Atherosclerotic heart disease of native coronary artery without angina pectoris: Secondary | ICD-10-CM | POA: Diagnosis not present

## 2020-05-03 DIAGNOSIS — M542 Cervicalgia: Secondary | ICD-10-CM | POA: Diagnosis not present

## 2020-05-03 DIAGNOSIS — Z8673 Personal history of transient ischemic attack (TIA), and cerebral infarction without residual deficits: Secondary | ICD-10-CM | POA: Insufficient documentation

## 2020-05-03 DIAGNOSIS — J9811 Atelectasis: Secondary | ICD-10-CM | POA: Diagnosis not present

## 2020-05-03 DIAGNOSIS — S0990XA Unspecified injury of head, initial encounter: Secondary | ICD-10-CM | POA: Diagnosis not present

## 2020-05-03 DIAGNOSIS — S3992XA Unspecified injury of lower back, initial encounter: Secondary | ICD-10-CM | POA: Diagnosis present

## 2020-05-03 DIAGNOSIS — R0902 Hypoxemia: Secondary | ICD-10-CM | POA: Diagnosis not present

## 2020-05-03 DIAGNOSIS — M2578 Osteophyte, vertebrae: Secondary | ICD-10-CM | POA: Diagnosis not present

## 2020-05-03 DIAGNOSIS — R55 Syncope and collapse: Secondary | ICD-10-CM | POA: Diagnosis not present

## 2020-05-03 DIAGNOSIS — M549 Dorsalgia, unspecified: Secondary | ICD-10-CM | POA: Diagnosis not present

## 2020-05-03 LAB — CUP PACEART REMOTE DEVICE CHECK
Battery Remaining Longevity: 79 mo
Battery Voltage: 2.97 V
Brady Statistic AP VP Percent: 90.17 %
Brady Statistic AP VS Percent: 0 %
Brady Statistic AS VP Percent: 9.8 %
Brady Statistic AS VS Percent: 0.02 %
Brady Statistic RA Percent Paced: 69.93 %
Brady Statistic RV Percent Paced: 99.97 %
Date Time Interrogation Session: 20211228091046
Implantable Lead Implant Date: 20180214
Implantable Lead Implant Date: 20180214
Implantable Lead Location: 753859
Implantable Lead Location: 753860
Implantable Lead Model: 5076
Implantable Lead Model: 5076
Implantable Pulse Generator Implant Date: 20180214
Lead Channel Impedance Value: 304 Ohm
Lead Channel Impedance Value: 342 Ohm
Lead Channel Impedance Value: 399 Ohm
Lead Channel Impedance Value: 418 Ohm
Lead Channel Pacing Threshold Amplitude: 0.375 V
Lead Channel Pacing Threshold Amplitude: 0.875 V
Lead Channel Pacing Threshold Pulse Width: 0.4 ms
Lead Channel Pacing Threshold Pulse Width: 0.4 ms
Lead Channel Sensing Intrinsic Amplitude: 1.625 mV
Lead Channel Sensing Intrinsic Amplitude: 1.625 mV
Lead Channel Sensing Intrinsic Amplitude: 14.875 mV
Lead Channel Sensing Intrinsic Amplitude: 14.875 mV
Lead Channel Setting Pacing Amplitude: 2 V
Lead Channel Setting Pacing Amplitude: 2.5 V
Lead Channel Setting Pacing Pulse Width: 0.4 ms
Lead Channel Setting Sensing Sensitivity: 2 mV

## 2020-05-03 LAB — COMPREHENSIVE METABOLIC PANEL
ALT: 26 U/L (ref 0–44)
AST: 45 U/L — ABNORMAL HIGH (ref 15–41)
Albumin: 4 g/dL (ref 3.5–5.0)
Alkaline Phosphatase: 66 U/L (ref 38–126)
Anion gap: 13 (ref 5–15)
BUN: 27 mg/dL — ABNORMAL HIGH (ref 8–23)
CO2: 25 mmol/L (ref 22–32)
Calcium: 9.2 mg/dL (ref 8.9–10.3)
Chloride: 100 mmol/L (ref 98–111)
Creatinine, Ser: 1.48 mg/dL — ABNORMAL HIGH (ref 0.61–1.24)
GFR, Estimated: 46 mL/min — ABNORMAL LOW (ref >60)
Glucose, Bld: 144 mg/dL — ABNORMAL HIGH (ref 70–99)
Potassium: 4.1 mmol/L (ref 3.5–5.1)
Sodium: 138 mmol/L (ref 135–145)
Total Bilirubin: 1 mg/dL (ref 0.3–1.2)
Total Protein: 7.1 g/dL (ref 6.5–8.1)

## 2020-05-03 LAB — CBC WITH DIFFERENTIAL/PLATELET
Abs Immature Granulocytes: 0.05 10*3/uL (ref 0.00–0.07)
Basophils Absolute: 0.1 10*3/uL (ref 0.0–0.1)
Basophils Relative: 1 %
Eosinophils Absolute: 0.2 10*3/uL (ref 0.0–0.5)
Eosinophils Relative: 2 %
HCT: 47.6 % (ref 39.0–52.0)
Hemoglobin: 15.9 g/dL (ref 13.0–17.0)
Immature Granulocytes: 1 %
Lymphocytes Relative: 27 %
Lymphs Abs: 2.6 10*3/uL (ref 0.7–4.0)
MCH: 30 pg (ref 26.0–34.0)
MCHC: 33.4 g/dL (ref 30.0–36.0)
MCV: 89.8 fL (ref 80.0–100.0)
Monocytes Absolute: 1.1 10*3/uL — ABNORMAL HIGH (ref 0.1–1.0)
Monocytes Relative: 11 %
Neutro Abs: 5.8 10*3/uL (ref 1.7–7.7)
Neutrophils Relative %: 58 %
Platelets: 159 10*3/uL (ref 150–400)
RBC: 5.3 MIL/uL (ref 4.22–5.81)
RDW: 15.1 % (ref 11.5–15.5)
WBC: 9.8 10*3/uL (ref 4.0–10.5)
nRBC: 0 % (ref 0.0–0.2)

## 2020-05-03 LAB — RESP PANEL BY RT-PCR (FLU A&B, COVID) ARPGX2
Influenza A by PCR: NEGATIVE
Influenza B by PCR: NEGATIVE
SARS Coronavirus 2 by RT PCR: NEGATIVE

## 2020-05-03 LAB — SAMPLE TO BLOOD BANK

## 2020-05-03 LAB — TROPONIN I (HIGH SENSITIVITY): Troponin I (High Sensitivity): 13 ng/L (ref <18)

## 2020-05-03 MED ORDER — ONDANSETRON HCL 4 MG/2ML IJ SOLN
4.0000 mg | Freq: Once | INTRAMUSCULAR | Status: AC
  Administered 2020-05-03: 17:00:00 4 mg via INTRAVENOUS
  Filled 2020-05-03: qty 2

## 2020-05-03 MED ORDER — ONDANSETRON HCL 4 MG/2ML IJ SOLN: 4.0000 mg | Freq: Once | INTRAMUSCULAR | Status: AC

## 2020-05-03 MED ORDER — MORPHINE SULFATE (PF) 4 MG/ML IV SOLN
INTRAVENOUS | Status: AC
  Administered 2020-05-03: 18:00:00 4 mg via INTRAVENOUS
  Filled 2020-05-03: qty 1

## 2020-05-03 MED ORDER — ACETAMINOPHEN 10 MG/ML IV SOLN
1000.0000 mg | Freq: Four times a day (QID) | INTRAVENOUS | Status: DC
  Administered 2020-05-03: 18:00:00 1000 mg via INTRAVENOUS
  Filled 2020-05-03 (×4): qty 100

## 2020-05-03 MED ORDER — MORPHINE SULFATE (PF) 4 MG/ML IV SOLN
4.0000 mg | Freq: Once | INTRAVENOUS | Status: AC
  Administered 2020-05-03: 19:00:00 4 mg via INTRAVENOUS
  Filled 2020-05-03: qty 1

## 2020-05-03 MED ORDER — HYDROMORPHONE HCL 1 MG/ML IJ SOLN
0.5000 mg | Freq: Once | INTRAMUSCULAR | Status: AC
Start: 2020-05-03 — End: 2020-05-03
  Administered 2020-05-03: 20:00:00 0.5 mg via INTRAVENOUS
  Filled 2020-05-03: qty 1

## 2020-05-03 MED ORDER — ONDANSETRON HCL 4 MG/2ML IJ SOLN
INTRAMUSCULAR | Status: AC
  Administered 2020-05-03: 22:00:00 4 mg via INTRAVENOUS
  Filled 2020-05-03: qty 2

## 2020-05-03 MED ORDER — HYDROMORPHONE HCL 1 MG/ML IJ SOLN
0.5000 mg | Freq: Once | INTRAMUSCULAR | Status: AC
Start: 2020-05-03 — End: 2020-05-03
  Administered 2020-05-03: 22:00:00 0.5 mg via INTRAVENOUS
  Filled 2020-05-03: qty 1

## 2020-05-03 MED ORDER — MORPHINE SULFATE (PF) 4 MG/ML IV SOLN
4.0000 mg | Freq: Once | INTRAVENOUS | Status: AC
  Administered 2020-05-03: 17:00:00 4 mg via INTRAVENOUS
  Filled 2020-05-03: qty 1

## 2020-05-03 MED ORDER — MORPHINE SULFATE (PF) 4 MG/ML IV SOLN: 4.0000 mg | Freq: Once | INTRAVENOUS | Status: AC

## 2020-05-03 NOTE — ED Notes (Signed)
Bladder scan value 

## 2020-05-03 NOTE — ED Notes (Signed)
meds given for pain per dr Darnelle Catalan verbal order.  Pt's spouse called by this rn at patient's request.
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
## 2020-05-03 NOTE — ED Notes (Signed)
Called [facility] for transfer spoke to [facility]

## 2020-05-03 NOTE — ED Notes (Signed)
Pt moved to room 16 for rectal exam performed by physician. Pt log rolled for exam with spine kept in alignment. Pt returned to supine position following exam. Pt covered with sheet and returned to 19H. Pt reports pain decreased following last pain medication administration. Denies further needs at this time.

## 2020-05-03 NOTE — ED Notes (Signed)
Called [facility] for transfer spoke to [facility] 2033
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
## 2020-05-03 NOTE — ED Notes (Signed)
Pt taken to CT at this time by this RN 

## 2020-05-03 NOTE — ED Notes (Signed)
[facility] and spoke to transport coordinator will call back [phone]

## 2020-05-03 NOTE — ED Notes (Signed)
Luke from [facility] called back and stated no neurosurgery beds available  2014

## 2020-05-03 NOTE — ED Notes (Signed)
Patient placed on 2L via [place] due to oxygen sats dropping to 88%. Pt's oxygen sats increased to 97% with 2L, good wave form. Pt in NAD at this time.

## 2020-05-04 ENCOUNTER — Inpatient Hospital Stay (HOSPITAL_COMMUNITY): Payer: Medicare Other

## 2020-05-04 ENCOUNTER — Inpatient Hospital Stay (HOSPITAL_COMMUNITY)
Admission: EM | Admit: 2020-05-04 | Discharge: 2020-05-11 | DRG: 459 | Disposition: A | Discharge status: Home Health | Payer: Medicare Other | Source: Other Acute Inpatient Hospital | Attending: Neurological Surgery | Admitting: Neurological Surgery

## 2020-05-04 ENCOUNTER — Emergency Department (HOSPITAL_COMMUNITY): Payer: Medicare Other

## 2020-05-04 ENCOUNTER — Encounter (HOSPITAL_COMMUNITY): Payer: Self-pay

## 2020-05-04 DIAGNOSIS — Z419 Encounter for procedure for purposes other than remedying health state, unspecified: Secondary | ICD-10-CM

## 2020-05-04 DIAGNOSIS — G9611 Dural tear: Secondary | ICD-10-CM | POA: Diagnosis present

## 2020-05-04 DIAGNOSIS — I442 Atrioventricular block, complete: Secondary | ICD-10-CM | POA: Diagnosis not present

## 2020-05-04 DIAGNOSIS — I480 Paroxysmal atrial fibrillation: Secondary | ICD-10-CM | POA: Diagnosis not present

## 2020-05-04 DIAGNOSIS — Z981 Arthrodesis status: Secondary | ICD-10-CM | POA: Diagnosis not present

## 2020-05-04 DIAGNOSIS — S32022A Unstable burst fracture of second lumbar vertebra, initial encounter for closed fracture: Principal | ICD-10-CM | POA: Diagnosis present

## 2020-05-04 DIAGNOSIS — R319 Hematuria, unspecified: Secondary | ICD-10-CM | POA: Diagnosis present

## 2020-05-04 DIAGNOSIS — G8250 Quadriplegia, unspecified: Secondary | ICD-10-CM | POA: Diagnosis present

## 2020-05-04 DIAGNOSIS — I4819 Other persistent atrial fibrillation: Secondary | ICD-10-CM | POA: Diagnosis not present

## 2020-05-04 DIAGNOSIS — G9589 Other specified diseases of spinal cord: Secondary | ICD-10-CM | POA: Diagnosis not present

## 2020-05-04 DIAGNOSIS — M47812 Spondylosis without myelopathy or radiculopathy, cervical region: Secondary | ICD-10-CM | POA: Diagnosis not present

## 2020-05-04 DIAGNOSIS — I2510 Atherosclerotic heart disease of native coronary artery without angina pectoris: Secondary | ICD-10-CM | POA: Diagnosis not present

## 2020-05-04 DIAGNOSIS — E785 Hyperlipidemia, unspecified: Secondary | ICD-10-CM | POA: Diagnosis not present

## 2020-05-04 DIAGNOSIS — T07XXXA Unspecified multiple injuries, initial encounter: Secondary | ICD-10-CM

## 2020-05-04 DIAGNOSIS — N401 Enlarged prostate with lower urinary tract symptoms: Secondary | ICD-10-CM | POA: Diagnosis present

## 2020-05-04 DIAGNOSIS — Z20822 Contact with and (suspected) exposure to covid-19: Secondary | ICD-10-CM | POA: Diagnosis present

## 2020-05-04 DIAGNOSIS — R531 Weakness: Secondary | ICD-10-CM | POA: Diagnosis not present

## 2020-05-04 DIAGNOSIS — S0012XA Contusion of left eyelid and periocular area, initial encounter: Secondary | ICD-10-CM | POA: Diagnosis present

## 2020-05-04 DIAGNOSIS — Z85828 Personal history of other malignant neoplasm of skin: Secondary | ICD-10-CM | POA: Diagnosis not present

## 2020-05-04 DIAGNOSIS — S32012A Unstable burst fracture of first lumbar vertebra, initial encounter for closed fracture: Secondary | ICD-10-CM | POA: Diagnosis not present

## 2020-05-04 DIAGNOSIS — Z8673 Personal history of transient ischemic attack (TIA), and cerebral infarction without residual deficits: Secondary | ICD-10-CM

## 2020-05-04 DIAGNOSIS — S32029A Unspecified fracture of second lumbar vertebra, initial encounter for closed fracture: Secondary | ICD-10-CM | POA: Diagnosis not present

## 2020-05-04 DIAGNOSIS — S3991XA Unspecified injury of abdomen, initial encounter: Secondary | ICD-10-CM | POA: Diagnosis not present

## 2020-05-04 DIAGNOSIS — Z950 Presence of cardiac pacemaker: Secondary | ICD-10-CM

## 2020-05-04 DIAGNOSIS — I129 Hypertensive chronic kidney disease with stage 1 through stage 4 chronic kidney disease, or unspecified chronic kidney disease: Secondary | ICD-10-CM | POA: Diagnosis present

## 2020-05-04 DIAGNOSIS — Y92413 State road as the place of occurrence of the external cause: Secondary | ICD-10-CM | POA: Diagnosis not present

## 2020-05-04 DIAGNOSIS — I10 Essential (primary) hypertension: Secondary | ICD-10-CM | POA: Diagnosis not present

## 2020-05-04 DIAGNOSIS — M4802 Spinal stenosis, cervical region: Secondary | ICD-10-CM | POA: Diagnosis not present

## 2020-05-04 DIAGNOSIS — S299XXA Unspecified injury of thorax, initial encounter: Secondary | ICD-10-CM | POA: Diagnosis not present

## 2020-05-04 DIAGNOSIS — Z85528 Personal history of other malignant neoplasm of kidney: Secondary | ICD-10-CM

## 2020-05-04 DIAGNOSIS — R339 Retention of urine, unspecified: Secondary | ICD-10-CM | POA: Diagnosis not present

## 2020-05-04 DIAGNOSIS — S32021A Stable burst fracture of second lumbar vertebra, initial encounter for closed fracture: Secondary | ICD-10-CM | POA: Diagnosis not present

## 2020-05-04 DIAGNOSIS — Z7901 Long term (current) use of anticoagulants: Secondary | ICD-10-CM | POA: Diagnosis not present

## 2020-05-04 DIAGNOSIS — Z4889 Encounter for other specified surgical aftercare: Secondary | ICD-10-CM

## 2020-05-04 DIAGNOSIS — M48061 Spinal stenosis, lumbar region without neurogenic claudication: Secondary | ICD-10-CM | POA: Diagnosis present

## 2020-05-04 DIAGNOSIS — M545 Low back pain, unspecified: Secondary | ICD-10-CM

## 2020-05-04 DIAGNOSIS — M4316 Spondylolisthesis, lumbar region: Secondary | ICD-10-CM | POA: Diagnosis not present

## 2020-05-04 DIAGNOSIS — S32020A Wedge compression fracture of second lumbar vertebra, initial encounter for closed fracture: Secondary | ICD-10-CM | POA: Diagnosis not present

## 2020-05-04 DIAGNOSIS — N183 Chronic kidney disease, stage 3 unspecified: Secondary | ICD-10-CM | POA: Diagnosis not present

## 2020-05-04 DIAGNOSIS — S32001A Stable burst fracture of unspecified lumbar vertebra, initial encounter for closed fracture: Secondary | ICD-10-CM | POA: Diagnosis not present

## 2020-05-04 DIAGNOSIS — Z791 Long term (current) use of non-steroidal anti-inflammatories (NSAID): Secondary | ICD-10-CM | POA: Diagnosis not present

## 2020-05-04 DIAGNOSIS — M47816 Spondylosis without myelopathy or radiculopathy, lumbar region: Secondary | ICD-10-CM | POA: Diagnosis not present

## 2020-05-04 DIAGNOSIS — Z79899 Other long term (current) drug therapy: Secondary | ICD-10-CM

## 2020-05-04 DIAGNOSIS — K219 Gastro-esophageal reflux disease without esophagitis: Secondary | ICD-10-CM | POA: Diagnosis not present

## 2020-05-04 DIAGNOSIS — N320 Bladder-neck obstruction: Secondary | ICD-10-CM | POA: Diagnosis present

## 2020-05-04 DIAGNOSIS — S32121A Minimally displaced Zone II fracture of sacrum, initial encounter for closed fracture: Secondary | ICD-10-CM | POA: Diagnosis not present

## 2020-05-04 DIAGNOSIS — M4326 Fusion of spine, lumbar region: Secondary | ICD-10-CM | POA: Diagnosis not present

## 2020-05-04 DIAGNOSIS — Z01810 Encounter for preprocedural cardiovascular examination: Secondary | ICD-10-CM | POA: Diagnosis not present

## 2020-05-04 DIAGNOSIS — Z23 Encounter for immunization: Secondary | ICD-10-CM | POA: Diagnosis not present

## 2020-05-04 LAB — CBC
HCT: 45.3 % (ref 39.0–52.0)
Hemoglobin: 14.8 g/dL (ref 13.0–17.0)
MCH: 30.1 pg (ref 26.0–34.0)
MCHC: 32.7 g/dL (ref 30.0–36.0)
MCV: 92.1 fL (ref 80.0–100.0)
Platelets: 145 10*3/uL — ABNORMAL LOW (ref 150–400)
RBC: 4.92 MIL/uL (ref 4.22–5.81)
RDW: 15.2 % (ref 11.5–15.5)
WBC: 12.7 10*3/uL — ABNORMAL HIGH (ref 4.0–10.5)
nRBC: 0 % (ref 0.0–0.2)

## 2020-05-04 LAB — BASIC METABOLIC PANEL
Anion gap: 10 (ref 5–15)
BUN: 28 mg/dL — ABNORMAL HIGH (ref 8–23)
CO2: 24 mmol/L (ref 22–32)
Calcium: 8.8 mg/dL — ABNORMAL LOW (ref 8.9–10.3)
Chloride: 99 mmol/L (ref 98–111)
Creatinine, Ser: 1.59 mg/dL — ABNORMAL HIGH (ref 0.61–1.24)
GFR, Estimated: 42 mL/min — ABNORMAL LOW (ref >60)
Glucose, Bld: 172 mg/dL — ABNORMAL HIGH (ref 70–99)
Potassium: 5 mmol/L (ref 3.5–5.1)
Sodium: 133 mmol/L — ABNORMAL LOW (ref 135–145)

## 2020-05-04 LAB — MAGNESIUM: Magnesium: 2 mg/dL (ref 1.7–2.4)

## 2020-05-04 MED ORDER — ONDANSETRON HCL 4 MG/2ML IJ SOLN
4.0000 mg | Freq: Once | INTRAMUSCULAR | Status: AC
  Administered 2020-05-04: 01:00:00 4 mg via INTRAVENOUS
  Filled 2020-05-04: qty 2

## 2020-05-04 MED ORDER — SODIUM CHLORIDE 0.9 % IV SOLN
INTRAVENOUS | Status: DC
  Administered 2020-05-07: 1000 mL via INTRAVENOUS

## 2020-05-04 MED ORDER — DIAZEPAM 2 MG PO TABS
2.0000 mg | ORAL_TABLET | Freq: Two times a day (BID) | ORAL | Status: DC | PRN
  Administered 2020-05-08 – 2020-05-09 (×3): 2 mg via ORAL
  Filled 2020-05-04 (×3): qty 1

## 2020-05-04 MED ORDER — DOCUSATE SODIUM 100 MG PO CAPS
100.0000 mg | ORAL_CAPSULE | Freq: Two times a day (BID) | ORAL | Status: DC
  Administered 2020-05-05 – 2020-05-06 (×4): 100 mg via ORAL
  Filled 2020-05-04 (×5): qty 1

## 2020-05-04 MED ORDER — PANTOPRAZOLE SODIUM 40 MG PO TBEC
40.0000 mg | DELAYED_RELEASE_TABLET | Freq: Every day | ORAL | Status: DC
  Administered 2020-05-04 – 2020-05-11 (×7): 40 mg via ORAL
  Filled 2020-05-04 (×7): qty 1

## 2020-05-04 MED ORDER — GABAPENTIN 300 MG PO CAPS
300.0000 mg | ORAL_CAPSULE | Freq: Three times a day (TID) | ORAL | Status: DC
  Administered 2020-05-04 – 2020-05-05 (×3): 300 mg via ORAL
  Filled 2020-05-04 (×3): qty 1

## 2020-05-04 MED ORDER — ACETAMINOPHEN 500 MG PO TABS
1000.0000 mg | ORAL_TABLET | Freq: Four times a day (QID) | ORAL | Status: DC
  Administered 2020-05-04 (×2): 1000 mg via ORAL
  Filled 2020-05-04 (×2): qty 2

## 2020-05-04 MED ORDER — HYDRALAZINE HCL 20 MG/ML IJ SOLN: 10.0000 mg | INTRAMUSCULAR | Status: DC | PRN

## 2020-05-04 MED ORDER — METHOCARBAMOL 1000 MG/10ML IJ SOLN
500.0000 mg | Freq: Four times a day (QID) | INTRAVENOUS | Status: DC | PRN
  Filled 2020-05-04 (×2): qty 5

## 2020-05-04 MED ORDER — OXYCODONE HCL 5 MG PO TABS
5.0000 mg | ORAL_TABLET | ORAL | Status: DC | PRN
  Administered 2020-05-06: 5 mg via ORAL
  Filled 2020-05-04: qty 1

## 2020-05-04 MED ORDER — METOPROLOL TARTRATE 5 MG/5ML IV SOLN: 5.0000 mg | Freq: Four times a day (QID) | INTRAVENOUS | Status: DC | PRN

## 2020-05-04 MED ORDER — HYDROMORPHONE HCL 1 MG/ML IJ SOLN
0.5000 mg | Freq: Once | INTRAMUSCULAR | Status: AC
  Administered 2020-05-04: 01:00:00 0.5 mg via INTRAVENOUS
  Filled 2020-05-04 (×2): qty 1

## 2020-05-04 MED ORDER — ATORVASTATIN CALCIUM 10 MG PO TABS
20.0000 mg | ORAL_TABLET | Freq: Every day | ORAL | Status: DC
  Administered 2020-05-05 – 2020-05-11 (×7): 20 mg via ORAL
  Filled 2020-05-04 (×7): qty 2

## 2020-05-04 MED ORDER — ONDANSETRON 4 MG PO TBDP: 4.0000 mg | ORAL_TABLET | Freq: Four times a day (QID) | ORAL | Status: DC | PRN

## 2020-05-04 MED ORDER — HYDROMORPHONE HCL 1 MG/ML IJ SOLN
0.5000 mg | INTRAMUSCULAR | Status: DC | PRN
  Administered 2020-05-05: 1 mg via INTRAVENOUS
  Administered 2020-05-06: 0.5 mg via INTRAVENOUS
  Administered 2020-05-06: 1 mg via INTRAVENOUS
  Administered 2020-05-06: 0.5 mg via INTRAVENOUS
  Administered 2020-05-06 – 2020-05-09 (×11): 1 mg via INTRAVENOUS
  Filled 2020-05-04 (×16): qty 1

## 2020-05-04 MED ORDER — TERAZOSIN HCL 1 MG PO CAPS
1.0000 mg | ORAL_CAPSULE | Freq: Every day | ORAL | Status: DC
  Administered 2020-05-05 – 2020-05-10 (×7): 1 mg via ORAL
  Filled 2020-05-04 (×8): qty 1

## 2020-05-04 MED ORDER — TEMAZEPAM 15 MG PO CAPS: 30.0000 mg | ORAL_CAPSULE | Freq: Every evening | ORAL | Status: DC | PRN

## 2020-05-04 MED ORDER — PHENOL 1.4 % MT LIQD: 1.0000 | OROMUCOSAL | Status: DC | PRN

## 2020-05-04 MED ORDER — CALCIUM CARBONATE ANTACID 500 MG PO CHEW: 1.0000 | CHEWABLE_TABLET | Freq: Three times a day (TID) | ORAL | Status: DC | PRN

## 2020-05-04 MED ORDER — ACETAMINOPHEN 160 MG/5ML PO SOLN
650.0000 mg | Freq: Four times a day (QID) | ORAL | Status: DC
  Administered 2020-05-04 – 2020-05-09 (×16): 650 mg via ORAL
  Filled 2020-05-04 (×19): qty 20.3

## 2020-05-04 MED ORDER — ONDANSETRON HCL 4 MG/2ML IJ SOLN: 4.0000 mg | Freq: Four times a day (QID) | INTRAMUSCULAR | Status: DC | PRN

## 2020-05-04 MED ORDER — BISACODYL 10 MG RE SUPP
10.0000 mg | Freq: Every day | RECTAL | Status: DC | PRN
  Administered 2020-05-09: 10 mg via RECTAL
  Filled 2020-05-04: qty 1

## 2020-05-04 MED ORDER — OXYCODONE HCL 5 MG PO TABS
10.0000 mg | ORAL_TABLET | ORAL | Status: DC | PRN
  Administered 2020-05-04 – 2020-05-05 (×7): 10 mg via ORAL
  Filled 2020-05-04 (×7): qty 2

## 2020-05-04 MED ORDER — METOPROLOL SUCCINATE ER 50 MG PO TB24
50.0000 mg | ORAL_TABLET | Freq: Every day | ORAL | Status: DC
  Administered 2020-05-05 – 2020-05-10 (×7): 50 mg via ORAL
  Filled 2020-05-04 (×7): qty 1

## 2020-05-04 MED ORDER — DOFETILIDE 125 MCG PO CAPS
125.0000 ug | ORAL_CAPSULE | Freq: Two times a day (BID) | ORAL | Status: DC
  Administered 2020-05-05 – 2020-05-11 (×12): 125 ug via ORAL
  Filled 2020-05-04 (×15): qty 1
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
## 2020-05-04 NOTE — Progress Notes (Signed)
Per order, change ddevice settings for MRI to DOO at 90 bpm   Tachy-therapies to off if applicable. SWOT RN staying to monitor patient during scan.  Will program device back to pre-MRI settings after completion of exam.

## 2020-05-04 NOTE — ED Notes (Signed)
Family expressing concerns about not having a family member present at bedside on the floor.  After discussing the medical concerns with Onalee Hua w/ [facility], he approved and exception for overnight visitation.

## 2020-05-04 NOTE — ED Notes (Signed)
Patient transported to CT 

## 2020-05-04 NOTE — ED Notes (Signed)
Pt is requesting an Ensure to drink. Notified provider of patient's request.

## 2020-05-04 NOTE — Progress Notes (Signed)
Informed of MRI for today.   Device system confirmed to be MRI conditional, with implant date > 6 weeks ago and no evidence of abandoned or epicardial leads in review of most recent CXR Interrogation from today reviewed, pt is currently AS-VP at 90 bpm Confirmed with RN that rates have now come down to 80 bpm.   Change device settings for MRI to DOO at 90 bpm  Tachy-therapies to off if applicable.  Program device back to pre-MRI settings after completion of exam.  Luane School  05/04/2020 9:44 AM

## 2020-05-04 NOTE — ED Notes (Signed)
Report attempted.  Staffing shift happening.  Will call me back soon.

## 2020-05-04 NOTE — ED Notes (Signed)
Patient transported to MRI 

## 2020-05-04 NOTE — Progress Notes (Signed)
Pt has Conditional pacer and leads. MRI TBD tomorrow during day when radiology RN can program implant. Floor or ED RN will have to monitor. Dr. Jake Samples aware.
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
## 2020-05-04 NOTE — ED Notes (Signed)
Patient placed on 2L  post Dilaudid administration, o2 sat 88% on RA
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
## 2020-05-04 NOTE — ED Provider Notes (Signed)
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 result in neurogenic retention. Bladder catheterization may be helpful for further management. Aortic Atherosclerosis (ICD10-I70.0). Electronically Signed   By: Fidela Salisbury MD   On: 05/04/2020 03:25    Procedures Procedures (including critical care time)  Medications Ordered in ED Medications  ondansetron [facility]) injection 4 mg (4 mg Intravenous Given 05/04/20 0111)  HYDROmorphone (DILAUDID) injection 0.5 mg (0.5 mg Intravenous Given 05/04/20 0111)    ED Course  I have reviewed the triage vital signs and the nursing notes.  Pertinent labs & imaging results that were available during my care of the patient were reviewed by me and considered in my medical decision making (see chart for details).    MDM Rules/Calculators/A&P  Patient with  3 column L2 burst fracture but no neurologic deficits of lower extremities.  Patient initially had weakness of his upper extremities.  At time of arrival to [facility] emergency department, however, this has resolved.  He has normal strength and sensation of his upper extremities.  Concern for central cord was therefore low.  CT cervical spine did not show any pathology.  I did add chest abdomen pelvis CT to evaluate for other injuries.  He has an IV dye allergy, these were done noncontrast.  No trauma was noted. 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  Patient will be admitted by trauma service with consultation to neurosurgery.  Final Clinical Impression(s) / ED Diagnoses Final diagnoses:  Critical polytrauma  Closed burst fracture of lumbar vertebra, initial encounter [facility])    Rx / DC Orders ED Discharge Orders    None       Orpah Greek, MD 05/04/20 [phone]

## 2020-05-04 NOTE — ED Notes (Signed)
Assuming care of patient at this time. Pt is resting with eyes closed. Family at bedside. Resp even and unlabored. Skin warm and dry. Bed in low position.

## 2020-05-05 ENCOUNTER — Encounter (HOSPITAL_COMMUNITY): Payer: Self-pay

## 2020-05-05 DIAGNOSIS — S32001A Stable burst fracture of unspecified lumbar vertebra, initial encounter for closed fracture: Secondary | ICD-10-CM

## 2020-05-05 DIAGNOSIS — I480 Paroxysmal atrial fibrillation: Secondary | ICD-10-CM | POA: Diagnosis not present

## 2020-05-05 DIAGNOSIS — Z01810 Encounter for preprocedural cardiovascular examination: Secondary | ICD-10-CM

## 2020-05-05 DIAGNOSIS — I442 Atrioventricular block, complete: Secondary | ICD-10-CM | POA: Diagnosis not present

## 2020-05-05 DIAGNOSIS — Z950 Presence of cardiac pacemaker: Secondary | ICD-10-CM

## 2020-05-05 LAB — URINALYSIS, ROUTINE W REFLEX MICROSCOPIC

## 2020-05-05 LAB — CBC
HCT: 37.1 % — ABNORMAL LOW (ref 39.0–52.0)
Hemoglobin: 12.1 g/dL — ABNORMAL LOW (ref 13.0–17.0)
MCH: 29.9 pg (ref 26.0–34.0)
MCHC: 32.6 g/dL (ref 30.0–36.0)
MCV: 91.6 fL (ref 80.0–100.0)
Platelets: 114 10*3/uL — ABNORMAL LOW (ref 150–400)
RBC: 4.05 MIL/uL — ABNORMAL LOW (ref 4.22–5.81)
RDW: 15.3 % (ref 11.5–15.5)
WBC: 10.3 10*3/uL (ref 4.0–10.5)
nRBC: 0 % (ref 0.0–0.2)

## 2020-05-05 LAB — URINALYSIS, MICROSCOPIC (REFLEX): RBC / HPF: >50 RBC/hpf (ref 0–5)

## 2020-05-05 LAB — BASIC METABOLIC PANEL
Anion gap: 5 (ref 5–15)
BUN: 22 mg/dL (ref 8–23)
CO2: 28 mmol/L (ref 22–32)
Calcium: 8.3 mg/dL — ABNORMAL LOW (ref 8.9–10.3)
Chloride: 102 mmol/L (ref 98–111)
Creatinine, Ser: 1.61 mg/dL — ABNORMAL HIGH (ref 0.61–1.24)
GFR, Estimated: 42 mL/min — ABNORMAL LOW (ref >60)
Glucose, Bld: 117 mg/dL — ABNORMAL HIGH (ref 70–99)
Potassium: 4.6 mmol/L (ref 3.5–5.1)
Sodium: 135 mmol/L (ref 135–145)

## 2020-05-05 MED ORDER — CHLORHEXIDINE GLUCONATE CLOTH 2 % EX PADS
6.0000 | MEDICATED_PAD | Freq: Every day | CUTANEOUS | Status: DC
  Administered 2020-05-05 – 2020-05-11 (×6): 6 via TOPICAL

## 2020-05-05 MED ORDER — INFLUENZA VAC A&B SA ADJ QUAD 0.5 ML IM PRSY
0.5000 mL | PREFILLED_SYRINGE | INTRAMUSCULAR | Status: AC
  Administered 2020-05-09: 0.5 mL via INTRAMUSCULAR
  Filled 2020-05-05 (×2): qty 0.5

## 2020-05-05 MED ORDER — PNEUMOCOCCAL VAC POLYVALENT 25 MCG/0.5ML IJ INJ
0.5000 mL | INJECTION | INTRAMUSCULAR | Status: AC
  Administered 2020-05-06: 0.5 mL via INTRAMUSCULAR
  Filled 2020-05-05: qty 0.5

## 2020-05-05 MED ORDER — HEPARIN SODIUM (PORCINE) 5000 UNIT/ML IJ SOLN
5000.0000 [IU] | Freq: Three times a day (TID) | INTRAMUSCULAR | Status: AC
  Administered 2020-05-05 – 2020-05-06 (×5): 5000 [IU] via SUBCUTANEOUS
  Filled 2020-05-05 (×5): qty 1

## 2020-05-05 MED ORDER — ENSURE ENLIVE PO LIQD
237.0000 mL | Freq: Two times a day (BID) | ORAL | Status: DC
  Administered 2020-05-05 – 2020-05-10 (×7): 237 mL via ORAL

## 2020-05-05 NOTE — Plan of Care (Signed)
  Problem: Education: °Goal: Knowledge of General Education information will improve °Description: Including pain rating scale, medication(s)/side effects and non-pharmacologic comfort measures °Outcome: Progressing °  °Problem: Health Behavior/Discharge Planning: °Goal: Ability to manage health-related needs will improve °Outcome: Progressing °  °Problem: Clinical Measurements: °Goal: Ability to maintain clinical measurements within normal limits will improve °Outcome: Progressing °Goal: Will remain free from infection °Outcome: Progressing °Goal: Diagnostic test results will improve °Outcome: Progressing °Goal: Respiratory complications will improve °Outcome: Progressing °Goal: Cardiovascular complication will be avoided °Outcome: Progressing °  °Problem: Activity: °Goal: Risk for activity intolerance will decrease °Outcome: Progressing °  °Problem: Nutrition: °Goal: Adequate nutrition will be maintained °Outcome: Progressing °  °Problem: Coping: °Goal: Level of anxiety will decrease °Outcome: Progressing °  °Problem: Elimination: °Goal: Will not experience complications related to bowel motility °Outcome: Progressing °Goal: Will not experience complications related to urinary retention °Outcome: Progressing °  °Problem: Pain Managment: °Goal: General experience of comfort will improve °Outcome: Progressing °  °Problem: Safety: °Goal: Ability to remain free from injury will improve °Outcome: Progressing °  °Problem: Skin Integrity: °Goal: Risk for impaired skin integrity will decrease °Outcome: Progressing °  °Problem: Activity: °Goal: Ability to perform activities at highest level will improve °Outcome: Progressing °Goal: Muscle strength will improve °Outcome: Progressing °  °Problem: Bowel/Gastric: °Goal: Ability to demonstrate the techniques of an individualized bowel program will improve °Outcome: Progressing °  °Problem: Education: °Goal: Knowledge of disease or condition will improve °Outcome:  Progressing °Goal: Knowledge of the prescribed therapeutic regimen will improve °Outcome: Progressing °  °Problem: Coping: °Goal: Ability to identify and develop effective coping behavior will improve °Outcome: Progressing °Goal: Ability to verbalize feelings will improve °Outcome: Progressing °  °Problem: Self-Care: °Goal: Ability to participate in self-care as condition permits will improve °Outcome: Progressing °  °Problem: Skin Integrity: °Goal: Risk for impaired skin integrity will decrease °Outcome: Progressing °  °Problem: Urinary Elimination: °Goal: Ability to achieve a regular elimination pattern will improve °Outcome: Progressing °  °

## 2020-05-05 NOTE — Progress Notes (Signed)
Pt c/o numbness in scrotum and buttocks areas, can feel pressure. Not sure if it is positional. Dr. Royanne Foots informed.

## 2020-05-05 NOTE — Progress Notes (Signed)
   Providing Compassionate, Quality Care - Together  NEUROSURGERY PROGRESS NOTE   S: No issues overnight.   O: EXAM:  BP 99/62 (BP Location: Right Arm)   Pulse 61   Temp 98.2 F (36.8 C) (Oral)   Resp 20   Ht 5\' 7"  (1.702 m)   Wt 77 kg   SpO2 94%   BMI 26.59 kg/m   Awake, alert, oriented  PERRLA Speech fluent, appropriate  CNs grossly intact  5/5 BUE/BLE  Sensory intact light touch  ASSESSMENT:  86 y.o. male with  1.  L2 burst fracture with retropulsion  PLAN: -Or plan for Sunday for open instrumentation L1-L3 with laminectomy. -Appreciate medical clearance -Continue holding anticoagulation -Would preferably have MRI lumbar spine preop however due to patient's pacemaker, this cannot be done apparently until Monday.  I will discuss with the family about proceeding with surgery without MRI. -Transient quadriparesis is improved and completely resolved.  Recommend cervical collar, soft MRI C-spine reviewed, no acute cord signal change or cord compression, chronic degenerative changes noted.    Thank you for allowing me to participate in this patient's care.  Please do not hesitate to call with questions or concerns.   Sunday, DO Neurosurgeon [facility] Cell: [phone]

## 2020-05-05 NOTE — Progress Notes (Addendum)
Pt's urine noted to be red in color. [facility] on-call Dr. Derryl Harbor paged. After speaking with Dr. Derryl Harbor, Dr. Freida Busman and Stechschulte (Trauma) updated about this finding.
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
## 2020-05-05 NOTE — Plan of Care (Signed)
  Problem: Education: Goal: Knowledge of General Education information will improve Description: Including pain rating scale, medication(s)/side effects and non-pharmacologic comfort measures Outcome: Progressing   Problem: Health Behavior/Discharge Planning: Goal: Ability to manage health-related needs will improve Outcome: Progressing   Problem: Clinical Measurements: Goal: Ability to maintain clinical measurements within normal limits will improve Outcome: Progressing Goal: Will remain free from infection Outcome: Progressing Goal: Diagnostic test results will improve Outcome: Progressing Goal: Respiratory complications will improve Outcome: Progressing Goal: Cardiovascular complication will be avoided Outcome: Progressing   Problem: Coping: Goal: Level of anxiety will decrease Outcome: Progressing   Problem: Pain Managment: Goal: General experience of comfort will improve Outcome: Progressing   Problem: Safety: Goal: Ability to remain free from injury will improve Outcome: Progressing   

## 2020-05-06 ENCOUNTER — Inpatient Hospital Stay (HOSPITAL_COMMUNITY): Payer: Medicare Other

## 2020-05-06 LAB — BASIC METABOLIC PANEL
Anion gap: 9 (ref 5–15)
BUN: 18 mg/dL (ref 8–23)
CO2: 23 mmol/L (ref 22–32)
Calcium: 8.4 mg/dL — ABNORMAL LOW (ref 8.9–10.3)
Chloride: 101 mmol/L (ref 98–111)
Creatinine, Ser: 1.31 mg/dL — ABNORMAL HIGH (ref 0.61–1.24)
GFR, Estimated: 53 mL/min — ABNORMAL LOW (ref >60)
Glucose, Bld: 111 mg/dL — ABNORMAL HIGH (ref 70–99)
Potassium: 4.1 mmol/L (ref 3.5–5.1)
Sodium: 133 mmol/L — ABNORMAL LOW (ref 135–145)

## 2020-05-06 LAB — SURGICAL PCR SCREEN
MRSA, PCR: NEGATIVE
Staphylococcus aureus: NEGATIVE

## 2020-05-06 LAB — MAGNESIUM: Magnesium: 1.9 mg/dL (ref 1.7–2.4)

## 2020-05-06 MED ORDER — CHLORHEXIDINE GLUCONATE CLOTH 2 % EX PADS
6.0000 | MEDICATED_PAD | Freq: Once | CUTANEOUS | Status: AC
  Administered 2020-05-07: 6 via TOPICAL

## 2020-05-06 MED ORDER — CHLORHEXIDINE GLUCONATE CLOTH 2 % EX PADS
6.0000 | MEDICATED_PAD | Freq: Once | CUTANEOUS | Status: AC
  Administered 2020-05-06: 6 via TOPICAL

## 2020-05-06 NOTE — Plan of Care (Signed)
Max assist adls, hob flat br
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
## 2020-05-06 NOTE — Progress Notes (Signed)
Pt with some relief in areas (scrotum and buttocks) of c/o numbness after repositioning.

## 2020-05-06 NOTE — Progress Notes (Signed)
Orthopedic Tech Progress Note Patient Details:  Thomas [name] 11-26-1932 [id number] Ordered brace  Patient ID: Thomas [name], male   DOB: April 26, 1933, 87 y.o.   MRN: [id number]   Michelle Piper 05/06/2020, 3:08 PM
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
## 2020-05-06 NOTE — Plan of Care (Signed)
  Problem: Education: °Goal: Knowledge of General Education information will improve °Description: Including pain rating scale, medication(s)/side effects and non-pharmacologic comfort measures °Outcome: Progressing °  °Problem: Health Behavior/Discharge Planning: °Goal: Ability to manage health-related needs will improve °Outcome: Progressing °  °Problem: Clinical Measurements: °Goal: Ability to maintain clinical measurements within normal limits will improve °Outcome: Progressing °Goal: Will remain free from infection °Outcome: Progressing °Goal: Diagnostic test results will improve °Outcome: Progressing °Goal: Respiratory complications will improve °Outcome: Progressing °Goal: Cardiovascular complication will be avoided °Outcome: Progressing °  °Problem: Activity: °Goal: Risk for activity intolerance will decrease °Outcome: Progressing °  °Problem: Nutrition: °Goal: Adequate nutrition will be maintained °Outcome: Progressing °  °Problem: Coping: °Goal: Level of anxiety will decrease °Outcome: Progressing °  °Problem: Elimination: °Goal: Will not experience complications related to bowel motility °Outcome: Progressing °Goal: Will not experience complications related to urinary retention °Outcome: Progressing °  °Problem: Pain Managment: °Goal: General experience of comfort will improve °Outcome: Progressing °  °Problem: Safety: °Goal: Ability to remain free from injury will improve °Outcome: Progressing °  °Problem: Skin Integrity: °Goal: Risk for impaired skin integrity will decrease °Outcome: Progressing °  °Problem: Activity: °Goal: Ability to perform activities at highest level will improve °Outcome: Progressing °Goal: Muscle strength will improve °Outcome: Progressing °  °Problem: Bowel/Gastric: °Goal: Ability to demonstrate the techniques of an individualized bowel program will improve °Outcome: Progressing °  °Problem: Education: °Goal: Knowledge of disease or condition will improve °Outcome:  Progressing °Goal: Knowledge of the prescribed therapeutic regimen will improve °Outcome: Progressing °  °Problem: Coping: °Goal: Ability to identify and develop effective coping behavior will improve °Outcome: Progressing °Goal: Ability to verbalize feelings will improve °Outcome: Progressing °  °Problem: Self-Care: °Goal: Ability to participate in self-care as condition permits will improve °Outcome: Progressing °  °Problem: Skin Integrity: °Goal: Risk for impaired skin integrity will decrease °Outcome: Progressing °  °Problem: Urinary Elimination: °Goal: Ability to achieve a regular elimination pattern will improve °Outcome: Progressing °  °

## 2020-05-07 ENCOUNTER — Inpatient Hospital Stay (HOSPITAL_COMMUNITY): Payer: Medicare Other

## 2020-05-07 ENCOUNTER — Inpatient Hospital Stay (HOSPITAL_COMMUNITY): Payer: Medicare Other | Admitting: Registered Nurse

## 2020-05-07 ENCOUNTER — Encounter (HOSPITAL_COMMUNITY)
Admission: EM | Disposition: A | Discharge status: Home Health | Payer: Self-pay | Source: Other Acute Inpatient Hospital

## 2020-05-07 ENCOUNTER — Encounter (HOSPITAL_COMMUNITY): Payer: Self-pay

## 2020-05-07 HISTORY — PX: BACK SURGERY: SHX140

## 2020-05-07 LAB — CBC
HCT: 36 % — ABNORMAL LOW (ref 39.0–52.0)
HCT: 32.4 % — ABNORMAL LOW (ref 39.0–52.0)
Hemoglobin: 12.1 g/dL — ABNORMAL LOW (ref 13.0–17.0)
Hemoglobin: 10.9 g/dL — ABNORMAL LOW (ref 13.0–17.0)
MCH: 30.3 pg (ref 26.0–34.0)
MCH: 30 pg (ref 26.0–34.0)
MCHC: 33.6 g/dL (ref 30.0–36.0)
MCHC: 33.6 g/dL (ref 30.0–36.0)
MCV: 90.2 fL (ref 80.0–100.0)
MCV: 89.3 fL (ref 80.0–100.0)
Platelets: 120 10*3/uL — ABNORMAL LOW (ref 150–400)
Platelets: 128 10*3/uL — ABNORMAL LOW (ref 150–400)
RBC: 3.99 MIL/uL — ABNORMAL LOW (ref 4.22–5.81)
RBC: 3.63 MIL/uL — ABNORMAL LOW (ref 4.22–5.81)
RDW: 14.9 % (ref 11.5–15.5)
RDW: 14.8 % (ref 11.5–15.5)
WBC: 7.6 10*3/uL (ref 4.0–10.5)
WBC: 7.7 10*3/uL (ref 4.0–10.5)
nRBC: 0 % (ref 0.0–0.2)
nRBC: 0 % (ref 0.0–0.2)

## 2020-05-07 LAB — BASIC METABOLIC PANEL
Anion gap: 9 (ref 5–15)
BUN: 16 mg/dL (ref 8–23)
CO2: 25 mmol/L (ref 22–32)
Calcium: 8.3 mg/dL — ABNORMAL LOW (ref 8.9–10.3)
Chloride: 100 mmol/L (ref 98–111)
Creatinine, Ser: 1.28 mg/dL — ABNORMAL HIGH (ref 0.61–1.24)
GFR, Estimated: 55 mL/min — ABNORMAL LOW (ref >60)
Glucose, Bld: 120 mg/dL — ABNORMAL HIGH (ref 70–99)
Potassium: 3.9 mmol/L (ref 3.5–5.1)
Sodium: 134 mmol/L — ABNORMAL LOW (ref 135–145)

## 2020-05-07 LAB — CREATININE, SERUM
Creatinine, Ser: 1.16 mg/dL (ref 0.61–1.24)
GFR, Estimated: >60 mL/min (ref >60)

## 2020-05-07 SURGERY — POSTERIOR LUMBAR FUSION 2 LEVEL
Anesthesia: General | Site: Back

## 2020-05-07 MED ORDER — SODIUM CHLORIDE 0.9 % IV SOLN: 250.0000 mL | INTRAVENOUS | Status: DC

## 2020-05-07 MED ORDER — VANCOMYCIN HCL 1000 MG IV SOLR
INTRAVENOUS | Status: DC | PRN
  Administered 2020-05-07: 1000 mg

## 2020-05-07 MED ORDER — PHENYLEPHRINE 40 MCG/ML (10ML) SYRINGE FOR IV PUSH (FOR BLOOD PRESSURE SUPPORT)
PREFILLED_SYRINGE | INTRAVENOUS | Status: AC
  Filled 2020-05-07: qty 10

## 2020-05-07 MED ORDER — ACETAMINOPHEN 650 MG RE SUPP: 650.0000 mg | RECTAL | Status: DC | PRN

## 2020-05-07 MED ORDER — LIDOCAINE 2% (20 MG/ML) 5 ML SYRINGE
INTRAMUSCULAR | Status: DC | PRN
  Administered 2020-05-07: 30 mg via INTRAVENOUS

## 2020-05-07 MED ORDER — DOCUSATE SODIUM 100 MG PO CAPS
100.0000 mg | ORAL_CAPSULE | Freq: Two times a day (BID) | ORAL | Status: DC
  Administered 2020-05-07 – 2020-05-10 (×6): 100 mg via ORAL
  Filled 2020-05-07 (×6): qty 1

## 2020-05-07 MED ORDER — SODIUM CHLORIDE 0.9% FLUSH: 3.0000 mL | INTRAVENOUS | Status: DC | PRN

## 2020-05-07 MED ORDER — ONDANSETRON HCL 4 MG/2ML IJ SOLN: 4.0000 mg | Freq: Once | INTRAMUSCULAR | Status: DC | PRN

## 2020-05-07 MED ORDER — VANCOMYCIN HCL IN DEXTROSE 1-5 GM/200ML-% IV SOLN
1000.0000 mg | INTRAVENOUS | Status: DC
  Administered 2020-05-08: 1000 mg via INTRAVENOUS
  Filled 2020-05-07: qty 200

## 2020-05-07 MED ORDER — THROMBIN 5000 UNITS EX SOLR
CUTANEOUS | Status: AC
  Filled 2020-05-07: qty 5000

## 2020-05-07 MED ORDER — LACTATED RINGERS IV SOLN: INTRAVENOUS | Status: DC | PRN

## 2020-05-07 MED ORDER — ROCURONIUM BROMIDE 10 MG/ML (PF) SYRINGE
PREFILLED_SYRINGE | INTRAVENOUS | Status: AC
  Filled 2020-05-07: qty 10

## 2020-05-07 MED ORDER — ROCURONIUM BROMIDE 10 MG/ML (PF) SYRINGE
PREFILLED_SYRINGE | INTRAVENOUS | Status: DC | PRN
  Administered 2020-05-07: 30 mg via INTRAVENOUS
  Administered 2020-05-07: 50 mg via INTRAVENOUS

## 2020-05-07 MED ORDER — THROMBIN 20000 UNITS EX SOLR
CUTANEOUS | Status: DC | PRN
  Administered 2020-05-07: 20 mL via TOPICAL

## 2020-05-07 MED ORDER — FENTANYL CITRATE (PF) 100 MCG/2ML IJ SOLN
INTRAMUSCULAR | Status: AC
  Administered 2020-05-07: 50 ug via INTRAVENOUS
  Filled 2020-05-07: qty 2

## 2020-05-07 MED ORDER — DEXAMETHASONE SODIUM PHOSPHATE 10 MG/ML IJ SOLN
INTRAMUSCULAR | Status: AC
  Filled 2020-05-07: qty 1

## 2020-05-07 MED ORDER — VANCOMYCIN HCL 1000 MG IV SOLR
INTRAVENOUS | Status: AC
  Filled 2020-05-07: qty 1000

## 2020-05-07 MED ORDER — VANCOMYCIN HCL IN DEXTROSE 1-5 GM/200ML-% IV SOLN
1000.0000 mg | INTRAVENOUS | Status: AC
  Administered 2020-05-07: 1000 mg via INTRAVENOUS
  Filled 2020-05-07 (×2): qty 200

## 2020-05-07 MED ORDER — BUPIVACAINE HCL (PF) 0.5 % IJ SOLN
INTRAMUSCULAR | Status: AC
  Filled 2020-05-07: qty 30

## 2020-05-07 MED ORDER — ACETAMINOPHEN 325 MG PO TABS
650.0000 mg | ORAL_TABLET | ORAL | Status: DC | PRN
  Administered 2020-05-07: 650 mg via ORAL
  Filled 2020-05-07: qty 2

## 2020-05-07 MED ORDER — PROPOFOL 10 MG/ML IV BOLUS
INTRAVENOUS | Status: DC | PRN
  Administered 2020-05-07: 110 mg via INTRAVENOUS

## 2020-05-07 MED ORDER — EPHEDRINE SULFATE-NACL 50-0.9 MG/10ML-% IV SOSY
PREFILLED_SYRINGE | INTRAVENOUS | Status: DC | PRN
  Administered 2020-05-07 (×2): 10 mg via INTRAVENOUS

## 2020-05-07 MED ORDER — PROPOFOL 500 MG/50ML IV EMUL
INTRAVENOUS | Status: DC | PRN
  Administered 2020-05-07: 75 ug/kg/min via INTRAVENOUS

## 2020-05-07 MED ORDER — THROMBIN 5000 UNITS EX SOLR
OROMUCOSAL | Status: DC | PRN
  Administered 2020-05-07: 5 mL via TOPICAL

## 2020-05-07 MED ORDER — SUGAMMADEX SODIUM 200 MG/2ML IV SOLN
INTRAVENOUS | Status: DC | PRN
  Administered 2020-05-07: 200 mg via INTRAVENOUS

## 2020-05-07 MED ORDER — LIDOCAINE-EPINEPHRINE 1 %-1:100000 IJ SOLN
INTRAMUSCULAR | Status: DC | PRN
  Administered 2020-05-07: 10 mL via INTRADERMAL

## 2020-05-07 MED ORDER — FENTANYL CITRATE (PF) 250 MCG/5ML IJ SOLN
INTRAMUSCULAR | Status: AC
  Filled 2020-05-07: qty 5

## 2020-05-07 MED ORDER — PROPOFOL 10 MG/ML IV BOLUS
INTRAVENOUS | Status: AC
  Filled 2020-05-07: qty 20

## 2020-05-07 MED ORDER — ONDANSETRON HCL 4 MG/2ML IJ SOLN
INTRAMUSCULAR | Status: AC
  Filled 2020-05-07: qty 2

## 2020-05-07 MED ORDER — FENTANYL CITRATE (PF) 250 MCG/5ML IJ SOLN
INTRAMUSCULAR | Status: DC | PRN
  Administered 2020-05-07: 50 ug via INTRAVENOUS
  Administered 2020-05-07 (×2): 100 ug via INTRAVENOUS
  Administered 2020-05-07 (×2): 50 ug via INTRAVENOUS

## 2020-05-07 MED ORDER — DEXAMETHASONE SODIUM PHOSPHATE 10 MG/ML IJ SOLN
INTRAMUSCULAR | Status: DC | PRN
  Administered 2020-05-07: 5 mg via INTRAVENOUS

## 2020-05-07 MED ORDER — 0.9 % SODIUM CHLORIDE (POUR BTL) OPTIME
TOPICAL | Status: DC | PRN
  Administered 2020-05-07: 1000 mL

## 2020-05-07 MED ORDER — FENTANYL CITRATE (PF) 100 MCG/2ML IJ SOLN: 25.0000 ug | INTRAMUSCULAR | Status: DC | PRN

## 2020-05-07 MED ORDER — PROPOFOL 10 MG/ML IV BOLUS
INTRAVENOUS | Status: AC
  Filled 2020-05-07: qty 40

## 2020-05-07 MED ORDER — LIDOCAINE 2% (20 MG/ML) 5 ML SYRINGE
INTRAMUSCULAR | Status: AC
  Filled 2020-05-07: qty 5

## 2020-05-07 MED ORDER — ONDANSETRON HCL 4 MG/2ML IJ SOLN
INTRAMUSCULAR | Status: DC | PRN
  Administered 2020-05-07: 4 mg via INTRAVENOUS

## 2020-05-07 MED ORDER — LIDOCAINE-EPINEPHRINE 1 %-1:100000 IJ SOLN
INTRAMUSCULAR | Status: AC
  Filled 2020-05-07: qty 1

## 2020-05-07 MED ORDER — PHENYLEPHRINE HCL-NACL 10-0.9 MG/250ML-% IV SOLN
INTRAVENOUS | Status: DC | PRN
  Administered 2020-05-07: 45 ug/min via INTRAVENOUS

## 2020-05-07 MED ORDER — OXYCODONE HCL 5 MG/5ML PO SOLN: 5.0000 mg | Freq: Once | ORAL | Status: DC | PRN

## 2020-05-07 MED ORDER — ALBUMIN HUMAN 5 % IV SOLN: INTRAVENOUS | Status: DC | PRN

## 2020-05-07 MED ORDER — HEPARIN SODIUM (PORCINE) 5000 UNIT/ML IJ SOLN
5000.0000 [IU] | Freq: Two times a day (BID) | INTRAMUSCULAR | Status: DC
  Administered 2020-05-08 – 2020-05-10 (×5): 5000 [IU] via SUBCUTANEOUS
  Filled 2020-05-07 (×6): qty 1

## 2020-05-07 MED ORDER — BUPIVACAINE LIPOSOME 1.3 % IJ SUSP
20.0000 mL | Freq: Once | INTRAMUSCULAR | Status: DC
  Filled 2020-05-07: qty 20

## 2020-05-07 MED ORDER — BACITRACIN ZINC 500 UNIT/GM EX OINT
TOPICAL_OINTMENT | CUTANEOUS | Status: AC
  Filled 2020-05-07: qty 28.35

## 2020-05-07 MED ORDER — BUPIVACAINE HCL (PF) 0.5 % IJ SOLN
INTRAMUSCULAR | Status: DC | PRN
  Administered 2020-05-07: 10 mL

## 2020-05-07 MED ORDER — BUPIVACAINE LIPOSOME 1.3 % IJ SUSP
INTRAMUSCULAR | Status: DC | PRN
  Administered 2020-05-07: 20 mL

## 2020-05-07 MED ORDER — ALUM & MAG HYDROXIDE-SIMETH 200-200-20 MG/5ML PO SUSP
30.0000 mL | Freq: Four times a day (QID) | ORAL | Status: DC | PRN
  Administered 2020-05-09 (×2): 30 mL via ORAL
  Filled 2020-05-07 (×3): qty 30

## 2020-05-07 MED ORDER — PHENYLEPHRINE 40 MCG/ML (10ML) SYRINGE FOR IV PUSH (FOR BLOOD PRESSURE SUPPORT)
PREFILLED_SYRINGE | INTRAVENOUS | Status: DC | PRN
  Administered 2020-05-07: 80 ug via INTRAVENOUS

## 2020-05-07 MED ORDER — EPHEDRINE 5 MG/ML INJ
INTRAVENOUS | Status: AC
  Filled 2020-05-07: qty 10

## 2020-05-07 MED ORDER — OXYCODONE HCL 5 MG PO TABS: 5.0000 mg | ORAL_TABLET | Freq: Once | ORAL | Status: DC | PRN

## 2020-05-07 MED ORDER — THROMBIN 20000 UNITS EX SOLR
CUTANEOUS | Status: AC
  Filled 2020-05-07: qty 20000

## 2020-05-07 MED ORDER — CHLORHEXIDINE GLUCONATE 0.12 % MT SOLN
OROMUCOSAL | Status: AC
  Administered 2020-05-07: 15 mL
  Filled 2020-05-07: qty 15

## 2020-05-07 MED ORDER — SODIUM CHLORIDE 0.9% FLUSH
3.0000 mL | Freq: Two times a day (BID) | INTRAVENOUS | Status: DC
  Administered 2020-05-07 – 2020-05-10 (×7): 3 mL via INTRAVENOUS

## 2020-05-07 SURGICAL SUPPLY — 78 items
BAG BANDED W/RUBBER/TAPE 36X54 (MISCELLANEOUS) ×4 IMPLANT
BAND RUBBER #18 3X1/16 STRL (MISCELLANEOUS) IMPLANT
BASKET BONE COLLECTION (BASKET) IMPLANT
BUR CARBIDE MATCH 3.0 (BURR) ×2 IMPLANT
CARTRIDGE OIL MAESTRO DRILL (MISCELLANEOUS) ×1 IMPLANT
CNTNR URN SCR LID CUP LEK RST (MISCELLANEOUS) ×1 IMPLANT
CONT SPEC 4OZ STRL OR WHT (MISCELLANEOUS) ×1
COVER BACK TABLE 60X90IN (DRAPES) ×2 IMPLANT
COVER MAYO STAND STRL (DRAPES) ×2 IMPLANT
COVER WAND RF STERILE (DRAPES) ×2 IMPLANT
DECANTER SPIKE VIAL GLASS SM (MISCELLANEOUS) ×2 IMPLANT
DERMABOND ADVANCED (GAUZE/BANDAGES/DRESSINGS) ×1
DERMABOND ADVANCED .7 DNX12 (GAUZE/BANDAGES/DRESSINGS) ×1 IMPLANT
DIFFUSER DRILL AIR PNEUMATIC (MISCELLANEOUS) ×2 IMPLANT
DRAPE LAPAROTOMY 100X72X124 (DRAPES) ×2 IMPLANT
DRAPE MICROSCOPE LEICA (MISCELLANEOUS) IMPLANT
DRAPE SURG 17X23 STRL (DRAPES) ×2 IMPLANT
DRSG OPSITE 4X5.5 SM (GAUZE/BANDAGES/DRESSINGS) ×2 IMPLANT
DRSG OPSITE POSTOP 4X8 (GAUZE/BANDAGES/DRESSINGS) ×2 IMPLANT
DRSG PAD ABDOMINAL 8X10 ST (GAUZE/BANDAGES/DRESSINGS) ×2 IMPLANT
DURAPREP 26ML APPLICATOR (WOUND CARE) ×2 IMPLANT
ELECT BLADE INSULATED 4IN (ELECTROSURGICAL) ×2
ELECT REM PT RETURN 9FT ADLT (ELECTROSURGICAL) ×2
ELECTRODE BLADE INSULATED 4IN (ELECTROSURGICAL) ×1 IMPLANT
ELECTRODE REM PT RTRN 9FT ADLT (ELECTROSURGICAL) ×1 IMPLANT
EXTENDER TAB GUIDE SV 5.5/6.0 (INSTRUMENTS) ×16 IMPLANT
FEE INTRAOP MONITOR IMPULS NCS (MISCELLANEOUS) ×1 IMPLANT
GAUZE 4X4 16PLY RFD (DISPOSABLE) IMPLANT
GAUZE SPONGE 4X4 12PLY STRL (GAUZE/BANDAGES/DRESSINGS) ×2 IMPLANT
GLOVE BIOGEL PI IND STRL 8 (GLOVE) ×2 IMPLANT
GLOVE BIOGEL PI INDICATOR 8 (GLOVE) ×2
GLOVE ECLIPSE 8.0 STRL XLNG CF (GLOVE) ×4 IMPLANT
GOWN STRL REUS W/ TWL LRG LVL3 (GOWN DISPOSABLE) ×2 IMPLANT
GOWN STRL REUS W/ TWL XL LVL3 (GOWN DISPOSABLE) ×2 IMPLANT
GOWN STRL REUS W/TWL 2XL LVL3 (GOWN DISPOSABLE) IMPLANT
GOWN STRL REUS W/TWL LRG LVL3 (GOWN DISPOSABLE) ×2
GOWN STRL REUS W/TWL XL LVL3 (GOWN DISPOSABLE) ×2
GRAFT BN 10X1XDBM MAGNIFUSE (Bone Implant) ×1 IMPLANT
GRAFT BONE MAGNIFUSE 1X10CM (Bone Implant) ×1 IMPLANT
GUIDEWIRE SHARP NITINOL 610 (WIRE) ×8 IMPLANT
INTRAOP MONITOR FEE IMPULS NCS (MISCELLANEOUS) ×1
INTRAOP MONITOR FEE IMPULSE (MISCELLANEOUS) ×1
KIT BASIN OR (CUSTOM PROCEDURE TRAY) ×2 IMPLANT
KIT POSITION SURG JACKSON T1 (MISCELLANEOUS) ×2 IMPLANT
KIT TURNOVER KIT B (KITS) ×2 IMPLANT
MARKER SKIN DUAL TIP RULER LAB (MISCELLANEOUS) ×2 IMPLANT
MILL MEDIUM DISP (BLADE) ×2 IMPLANT
NEEDLE BEVEL TWO-PAK W/1PK (NEEDLE) ×2 IMPLANT
NEEDLE HYPO 21X1.5 SAFETY (NEEDLE) ×2 IMPLANT
NEEDLE HYPO 25X1 1.5 SAFETY (NEEDLE) ×2 IMPLANT
NEEDLE SPNL 18GX3.5 QUINCKE PK (NEEDLE) ×2 IMPLANT
NEEDLE SPNL 20GX3.5 QUINCKE YW (NEEDLE) ×2 IMPLANT
NEEDLE SPNL 22GX3.5 QUINCKE BK (NEEDLE) ×2 IMPLANT
NS IRRIG 1000ML POUR BTL (IV SOLUTION) ×2 IMPLANT
OIL CARTRIDGE MAESTRO DRILL (MISCELLANEOUS) ×2
PACK LAMINECTOMY NEURO (CUSTOM PROCEDURE TRAY) ×2 IMPLANT
PAD ARMBOARD 7.5X6 YLW CONV (MISCELLANEOUS) ×4 IMPLANT
PATTIES SURGICAL .5 X.5 (GAUZE/BANDAGES/DRESSINGS) IMPLANT
PATTIES SURGICAL .5 X1 (DISPOSABLE) ×2 IMPLANT
PATTIES SURGICAL 1X1 (DISPOSABLE) IMPLANT
ROD PERC CCM 5.5X70 (Rod) ×4 IMPLANT
SCREW FENS MAS CCM 6.5X55 (Screw) ×4 IMPLANT
SCREW MAS FENS 6.5 45 (Screw) ×2 IMPLANT
SCREW MAS FENS 6.5X45 (Screw) ×2 IMPLANT
SCREW SET 5.5/6.0MM SOLERA (Screw) ×8 IMPLANT
SEALANT ADHERUS EXTEND TIP (MISCELLANEOUS) ×2 IMPLANT
SPONGE LAP 4X18 RFD (DISPOSABLE) IMPLANT
STAPLER VISISTAT 35W (STAPLE) ×2 IMPLANT
SUT PROLENE 5 0 C1 (SUTURE) ×2 IMPLANT
SUT VIC AB 0 CT1 18XCR BRD8 (SUTURE) ×1 IMPLANT
SUT VIC AB 0 CT1 8-18 (SUTURE) ×1
SUT VIC AB 2-0 CP2 18 (SUTURE) ×2 IMPLANT
SUT VIC AB 3-0 SH 8-18 (SUTURE) ×2 IMPLANT
SYR 20CC LL (SYRINGE) ×2 IMPLANT
TOWEL GREEN STERILE (TOWEL DISPOSABLE) IMPLANT
TOWEL GREEN STERILE FF (TOWEL DISPOSABLE) IMPLANT
TRAY FOLEY MTR SLVR 16FR STAT (SET/KITS/TRAYS/PACK) ×2 IMPLANT
WATER STERILE IRR 1000ML POUR (IV SOLUTION) ×2 IMPLANT

## 2020-05-07 NOTE — Plan of Care (Signed)
  Problem: Education: °Goal: Knowledge of General Education information will improve °Description: Including pain rating scale, medication(s)/side effects and non-pharmacologic comfort measures °Outcome: Progressing °  °Problem: Health Behavior/Discharge Planning: °Goal: Ability to manage health-related needs will improve °Outcome: Progressing °  °Problem: Clinical Measurements: °Goal: Ability to maintain clinical measurements within normal limits will improve °Outcome: Progressing °Goal: Will remain free from infection °Outcome: Progressing °Goal: Diagnostic test results will improve °Outcome: Progressing °Goal: Respiratory complications will improve °Outcome: Progressing °Goal: Cardiovascular complication will be avoided °Outcome: Progressing °  °Problem: Activity: °Goal: Risk for activity intolerance will decrease °Outcome: Progressing °  °Problem: Nutrition: °Goal: Adequate nutrition will be maintained °Outcome: Progressing °  °Problem: Coping: °Goal: Level of anxiety will decrease °Outcome: Progressing °  °Problem: Elimination: °Goal: Will not experience complications related to bowel motility °Outcome: Progressing °Goal: Will not experience complications related to urinary retention °Outcome: Progressing °  °Problem: Pain Managment: °Goal: General experience of comfort will improve °Outcome: Progressing °  °Problem: Safety: °Goal: Ability to remain free from injury will improve °Outcome: Progressing °  °Problem: Skin Integrity: °Goal: Risk for impaired skin integrity will decrease °Outcome: Progressing °  °Problem: Activity: °Goal: Ability to perform activities at highest level will improve °Outcome: Progressing °Goal: Muscle strength will improve °Outcome: Progressing °  °Problem: Bowel/Gastric: °Goal: Ability to demonstrate the techniques of an individualized bowel program will improve °Outcome: Progressing °  °Problem: Education: °Goal: Knowledge of disease or condition will improve °Outcome:  Progressing °Goal: Knowledge of the prescribed therapeutic regimen will improve °Outcome: Progressing °  °Problem: Coping: °Goal: Ability to identify and develop effective coping behavior will improve °Outcome: Progressing °Goal: Ability to verbalize feelings will improve °Outcome: Progressing °  °Problem: Self-Care: °Goal: Ability to participate in self-care as condition permits will improve °Outcome: Progressing °  °Problem: Skin Integrity: °Goal: Risk for impaired skin integrity will decrease °Outcome: Progressing °  °Problem: Urinary Elimination: °Goal: Ability to achieve a regular elimination pattern will improve °Outcome: Progressing °  °
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
## 2020-05-07 NOTE — Progress Notes (Signed)
   Providing Compassionate, Quality Care - Together  NEUROSURGERY PROGRESS NOTE   S: No issues overnight. Brace delivered  O: EXAM:  BP (!) 159/77 (BP Location: Left Arm)   Pulse 77   Temp 98.3 F (36.8 C) (Oral)   Resp 18   Ht 5\' 7"  (1.702 m)   Wt 77 kg   SpO2 94%   BMI 26.59 kg/m   Awake, alert, oriented  Speech fluent, appropriate  CNs grossly intact  5/5 BUE/BLE  SILT  ASSESSMENT:  87 y.o. male with  1.  L2 burst fracture with severe stenosis  PLAN: -OR this am -cleared by cards, been off eliquis -npo   Thank you for allowing me to participate in this patient's care.  Please do not hesitate to call with questions or concerns.   83, DO Neurosurgeon [facility] Cell: [phone]
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
## 2020-05-07 NOTE — Anesthesia Procedure Notes (Signed)
Procedure Name: Intubation Date/Time: 05/07/2020 8:08 AM Performed by: Laruth Bouchard., CRNA Pre-anesthesia Checklist: Patient identified, Emergency Drugs available, Suction available, Patient being monitored and Timeout performed Patient Re-evaluated:Patient Re-evaluated prior to induction Oxygen Delivery Method: Circle system utilized Preoxygenation: Pre-oxygenation with 100% oxygen Induction Type: IV induction Ventilation: Mask ventilation without difficulty Laryngoscope Size: Glidescope and 4 Grade View: Grade I Tube type: Oral Tube size: 8.0 mm Number of attempts: 1 Airway Equipment and Method: Rigid stylet and Video-laryngoscopy Placement Confirmation: ETT inserted through vocal cords under direct vision,  positive ETCO2 and breath sounds checked- equal and bilateral Secured at: 22 cm Tube secured with: Tape Dental Injury: Teeth and Oropharynx as per pre-operative assessment
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
## 2020-05-07 NOTE — Anesthesia Preprocedure Evaluation (Signed)
Anesthesia Evaluation  Patient identified by MRN, date of birth, ID band Patient awake    Reviewed: Allergy & Precautions, NPO status , Patient's Chart, lab work & pertinent test results  Airway Mallampati: II   Neck ROM: Full    Dental  (+) Teeth Intact, Dental Advisory Given   Pulmonary    breath sounds clear to auscultation       Cardiovascular hypertension,  Rhythm:Regular Rate:Normal     Neuro/Psych    GI/Hepatic   Endo/Other    Renal/GU      Musculoskeletal   Abdominal   Peds  Hematology   Anesthesia Other Findings   Reproductive/Obstetrics                             Anesthesia Physical Anesthesia Plan  ASA: III  Anesthesia Plan: General   Post-op Pain Management:    Induction: Intravenous  PONV Risk Score and Plan: Ondansetron and Dexamethasone  Airway Management Planned: Oral ETT  Additional Equipment: Arterial line  Intra-op Plan:   Post-operative Plan: Extubation in OR  Informed Consent: I have reviewed the patients History and Physical, chart, labs and discussed the procedure including the risks, benefits and alternatives for the proposed anesthesia with the patient or authorized representative who has indicated his/her understanding and acceptance.     Dental advisory given  Plan Discussed with: CRNA and Anesthesiologist  Anesthesia Plan Comments:         Anesthesia Quick Evaluation

## 2020-05-07 NOTE — Anesthesia Postprocedure Evaluation (Signed)
Anesthesia Post Note  Patient: ASIER DESROCHES  Procedure(s) Performed: LUMBAR ONE THROUGH LUMBAR THREE INSTRUMENTATION, DECOMPRESSION AND FUSION, REDUCTION OF LUMBAR TWO BURST FRACTURE (N/A Back)     Patient location during evaluation: PACU Anesthesia Type: General Level of consciousness: confused, awake and patient cooperative Pain management: pain level controlled Vital Signs Assessment: post-procedure vital signs reviewed and stable Respiratory status: spontaneous breathing, nonlabored ventilation, respiratory function stable and patient connected to nasal cannula oxygen Cardiovascular status: blood pressure returned to baseline and stable Postop Assessment: no apparent nausea or vomiting Anesthetic complications: no   No complications documented.  Last Vitals:  Vitals:   05/07/20 1425 05/07/20 1653  BP: 136/61 (!) 141/74  Pulse: 61 81  Resp: 20 16  Temp: 36.4 C 36.7 C  SpO2: 95% 96%    Last Pain:  Vitals:   05/07/20 1653  TempSrc: Oral  PainSc:                  Collier Bohnet COKER

## 2020-05-07 NOTE — Anesthesia Procedure Notes (Signed)
Arterial Line Insertion Start/End1/06/2020 8:05 AM, 05/07/2020 8:15 AM Performed by: Rosiland Oz, CRNA, CRNA  Preanesthetic checklist: patient identified, IV checked, site marked, risks and benefits discussed, surgical consent, monitors and equipment checked, pre-op evaluation, timeout performed and anesthesia consent Patient sedated Right, radial was placed Catheter size: 20 G Hand hygiene performed  and maximum sterile barriers used   Attempts: 3 Procedure performed without using ultrasound guided technique. Following insertion, dressing applied and Biopatch. Post procedure assessment: normal and unchanged  Patient tolerated the procedure well with no immediate complications.
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
## 2020-05-07 NOTE — Progress Notes (Signed)
Orthopedic Tech Progress Note Patient Details:  Thomas [name] October 12, 1932 [id number]  Ortho Devices Type of Ortho Device: Soft collar Ortho Device/Splint Interventions: Application,Ordered   Post Interventions Patient Tolerated: Well   [name] [name] 05/07/2020, 2:56 PM

## 2020-05-07 NOTE — Progress Notes (Signed)
Pharmacy Antibiotic Note  Thomas [name] is a 87 y.o. male admitted on 05/04/2020 for back surgery.  Patient is s/p L1-3 posterior lateral fusion w/ decompression 1/2.  Patient received vancomycin 1g pre-operatively at 07:10 this morning.  Pharmacy has been consulted to start post-operative vancomycin dosing 12 hours post-op while Hemovac drain in place.  AET 05/07/20 @1235   Plan: Vancomycin 1g IV q24 hours starting 05/08/20 @0030   Follow-up for drain removal and length of therapy Monitor for signs of infection  Height: 5\' 7"  (170.2 cm) Weight: 77 kg (169 lb 12.1 oz) IBW/kg (Calculated) : 66.1  Temp (24hrs), Avg:98.1 F (36.7 C), Min:97.6 F (36.4 C), Max:98.7 F (37.1 C)  Recent Labs  Lab 05/03/20 1714 05/04/20 0354 05/05/20 0349 05/06/20 0800 05/07/20 0251  WBC 9.8 12.7* 10.3  --  7.6  CREATININE 1.48* 1.59* 1.61* 1.31* 1.28*    Estimated Creatinine Clearance: 39.4 mL/min (A) (by C-G formula based on SCr of 1.28 mg/dL (H)).    Allergies  Allergen Reactions  . Iodine Hives and Other (See Comments)    IVP contrast  . Penicillins Itching, Rash and Other (See Comments)    ITCHY FEELING IN FINGERS Has patient had a PCN reaction causing immediate rash, facial/tongue/throat swelling, SOB or lightheadedness with hypotension: No Has patient had a PCN reaction causing severe rash involving mucus membranes or skin necrosis: No Has patient had a PCN reaction that required hospitalization No Has patient had a PCN reaction occurring within the last 10 years: No If all of the above answers are "NO", then may proceed with Cephalosporin use.     Antimicrobials this admission: Vancomycin 1/2 >>    Microbiology results: N/A  Thank you for allowing pharmacy to be a part of this patient's care.  05/07/20, PharmD PGY-1 Acute Care Pharmacy Resident 05/07/2020 2:55 PM

## 2020-05-07 NOTE — Transfer of Care (Signed)
Immediate Anesthesia Transfer of Care Note  Patient: Thomas [name]  Procedure(s) Performed: LUMBAR ONE THROUGH LUMBAR THREE INSTRUMENTATION, DECOMPRESSION AND FUSION, REDUCTION OF LUMBAR TWO BURST FRACTURE (N/A Back)  Patient Location: PACU  Anesthesia Type:General  Level of Consciousness: drowsy and patient cooperative  Airway & Oxygen Therapy: Patient Spontanous Breathing  Post-op Assessment: Report given to RN and Post -op Vital signs reviewed and stable  Post vital signs: Reviewed and stable  Last Vitals:  Vitals Value Taken Time  BP    Temp    Pulse    Resp    SpO2      Last Pain:  Vitals:   05/07/20 0718  TempSrc:   PainSc: 4       Patients Stated Pain Goal: 2 (05/07/20 0718)  Complications: No complications documented.

## 2020-05-08 ENCOUNTER — Inpatient Hospital Stay (HOSPITAL_COMMUNITY): Payer: Medicare Other

## 2020-05-08 ENCOUNTER — Other Ambulatory Visit: Payer: Self-pay

## 2020-05-08 DIAGNOSIS — E291 Testicular hypofunction: Secondary | ICD-10-CM

## 2020-05-08 LAB — BASIC METABOLIC PANEL
Anion gap: 11 (ref 5–15)
BUN: 16 mg/dL (ref 8–23)
CO2: 23 mmol/L (ref 22–32)
Calcium: 8.2 mg/dL — ABNORMAL LOW (ref 8.9–10.3)
Chloride: 100 mmol/L (ref 98–111)
Creatinine, Ser: 1.34 mg/dL — ABNORMAL HIGH (ref 0.61–1.24)
GFR, Estimated: 52 mL/min — ABNORMAL LOW (ref >60)
Glucose, Bld: 172 mg/dL — ABNORMAL HIGH (ref 70–99)
Potassium: 4 mmol/L (ref 3.5–5.1)
Sodium: 134 mmol/L — ABNORMAL LOW (ref 135–145)

## 2020-05-08 LAB — MAGNESIUM: Magnesium: 2 mg/dL (ref 1.7–2.4)

## 2020-05-08 MED ORDER — OXYCODONE HCL 5 MG PO TABS
5.0000 mg | ORAL_TABLET | ORAL | Status: DC | PRN
  Administered 2020-05-08 – 2020-05-09 (×3): 10 mg via ORAL
  Filled 2020-05-08 (×3): qty 2

## 2020-05-08 NOTE — Progress Notes (Signed)
Per Dr. Maurice Small, "ok to cancel pt's scheduled Vancomycin IV". R&R
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
## 2020-05-08 NOTE — Plan of Care (Signed)
Problem: Education: Goal: Knowledge of General Education information will improve Description: Including pain rating scale, medication(s)/side effects and non-pharmacologic comfort measures 05/08/2020 0128 by Lennox Grumbles, RN Outcome: Progressing 05/08/2020 0127 by Lennox Grumbles, RN Outcome: Progressing   Problem: Health Behavior/Discharge Planning: Goal: Ability to manage health-related needs will improve 05/08/2020 0128 by Lennox Grumbles, RN Outcome: Progressing 05/08/2020 0127 by Lennox Grumbles, RN Outcome: Progressing   Problem: Clinical Measurements: Goal: Ability to maintain clinical measurements within normal limits will improve 05/08/2020 0128 by Lennox Grumbles, RN Outcome: Progressing 05/08/2020 0127 by Lennox Grumbles, RN Outcome: Progressing Goal: Will remain free from infection 05/08/2020 0128 by Lennox Grumbles, RN Outcome: Progressing 05/08/2020 0127 by Lennox Grumbles, RN Outcome: Progressing Goal: Diagnostic test results will improve 05/08/2020 0128 by Lennox Grumbles, RN Outcome: Progressing 05/08/2020 0127 by Lennox Grumbles, RN Outcome: Progressing Goal: Respiratory complications will improve 05/08/2020 0128 by Lennox Grumbles, RN Outcome: Progressing 05/08/2020 0127 by Lennox Grumbles, RN Outcome: Progressing Goal: Cardiovascular complication will be avoided 05/08/2020 0128 by Lennox Grumbles, RN Outcome: Progressing 05/08/2020 0127 by Lennox Grumbles, RN Outcome: Progressing   Problem: Activity: Goal: Risk for activity intolerance will decrease 05/08/2020 0128 by Lennox Grumbles, RN Outcome: Progressing 05/08/2020 0127 by Lennox Grumbles, RN Outcome: Progressing   Problem: Nutrition: Goal: Adequate nutrition will be maintained 05/08/2020 0128 by Lennox Grumbles, RN Outcome: Progressing 05/08/2020 0127 by Lennox Grumbles, RN Outcome: Progressing   Problem: Coping: Goal: Level of anxiety will decrease 05/08/2020 0128 by Lennox Grumbles, RN Outcome: Progressing 05/08/2020 0127 by Lennox Grumbles, RN Outcome: Progressing   Problem: Elimination: Goal: Will not experience complications related to bowel motility 05/08/2020 0128 by Lennox Grumbles, RN Outcome: Progressing 05/08/2020 0127 by Lennox Grumbles, RN Outcome: Progressing Goal: Will not experience complications related to urinary retention 05/08/2020 0128 by Lennox Grumbles, RN Outcome: Progressing 05/08/2020 0127 by Lennox Grumbles, RN Outcome: Progressing   Problem: Pain Managment: Goal: General experience of comfort will improve 05/08/2020 0128 by Lennox Grumbles, RN Outcome: Progressing 05/08/2020 0127 by Lennox Grumbles, RN Outcome: Progressing   Problem: Safety: Goal: Ability to remain free from injury will improve 05/08/2020 0128 by Lennox Grumbles, RN Outcome: Progressing 05/08/2020 0127 by Lennox Grumbles, RN Outcome: Progressing   Problem: Skin Integrity: Goal: Risk for impaired skin integrity will decrease 05/08/2020 0128 by Lennox Grumbles, RN Outcome: Progressing 05/08/2020 0127 by Lennox Grumbles, RN Outcome: Progressing   Problem: Activity: Goal: Ability to perform activities at highest level will improve 05/08/2020 0128 by Lennox Grumbles, RN Outcome: Progressing 05/08/2020 0127 by Lennox Grumbles, RN Outcome: Progressing Goal: Muscle strength will improve 05/08/2020 0128 by Lennox Grumbles, RN Outcome: Progressing 05/08/2020 0127 by Lennox Grumbles, RN Outcome: Progressing   Problem: Bowel/Gastric: Goal: Ability to demonstrate the techniques of an individualized bowel program will improve 05/08/2020 0128 by Lennox Grumbles, RN Outcome: Progressing 05/08/2020 0127 by Lennox Grumbles, RN Outcome: Progressing   Problem: Education: Goal: Knowledge of disease or condition will improve 05/08/2020 0128 by Lennox Grumbles, RN Outcome: Progressing 05/08/2020 0127 by Lennox Grumbles, RN Outcome: Progressing Goal: Knowledge of the prescribed therapeutic regimen will improve 05/08/2020 0128 by Lennox Grumbles, RN Outcome:  Progressing 05/08/2020 0127 by Lennox Grumbles, RN Outcome: Progressing   Problem: Coping: Goal: Ability to identify and develop effective coping behavior will improve 05/08/2020  0128 by Ronalee Red, RN Outcome: Progressing 05/08/2020 0127 by Ronalee Red, RN Outcome: Progressing Goal: Ability to verbalize feelings will improve 05/08/2020 0128 by Ronalee Red, RN Outcome: Progressing 05/08/2020 0127 by Ronalee Red, RN Outcome: Progressing   Problem: Self-Care: Goal: Ability to participate in self-care as condition permits will improve 05/08/2020 0128 by Ronalee Red, RN Outcome: Progressing 05/08/2020 0127 by Ronalee Red, RN Outcome: Progressing   Problem: Skin Integrity: Goal: Risk for impaired skin integrity will decrease 05/08/2020 0128 by Ronalee Red, RN Outcome: Progressing 05/08/2020 0127 by Ronalee Red, RN Outcome: Progressing   Problem: Urinary Elimination: Goal: Ability to achieve a regular elimination pattern will improve 05/08/2020 0128 by Ronalee Red, RN Outcome: Progressing 05/08/2020 0127 by Ronalee Red, RN Outcome: Progressing   Problem: Education: Goal: Ability to verbalize activity precautions or restrictions will improve Outcome: Progressing Goal: Knowledge of the prescribed therapeutic regimen will improve Outcome: Progressing Goal: Understanding of discharge needs will improve Outcome: Progressing   Problem: Activity: Goal: Ability to avoid complications of mobility impairment will improve Outcome: Progressing Goal: Ability to tolerate increased activity will improve Outcome: Progressing Goal: Will remain free from falls Outcome: Progressing   Problem: Bowel/Gastric: Goal: Gastrointestinal status for postoperative course will improve Outcome: Progressing   Problem: Clinical Measurements: Goal: Ability to maintain clinical measurements within normal limits will improve Outcome: Progressing Goal: Postoperative complications will be  avoided or minimized Outcome: Progressing Goal: Diagnostic test results will improve Outcome: Progressing   Problem: Pain Management: Goal: Pain level will decrease Outcome: Progressing   Problem: Skin Integrity: Goal: Will show signs of wound healing Outcome: Progressing   Problem: Health Behavior/Discharge Planning: Goal: Identification of resources available to assist in meeting health care needs will improve Outcome: Progressing   Problem: Bladder/Genitourinary: Goal: Urinary functional status for postoperative course will improve Outcome: Progressing

## 2020-05-08 NOTE — Plan of Care (Signed)
Problem: Education: Goal: Knowledge of General Education information will improve Description: Including pain rating scale, medication(s)/side effects and non-pharmacologic comfort measures Outcome: Progressing   Problem: Health Behavior/Discharge Planning: Goal: Ability to manage health-related needs will improve Outcome: Progressing   Problem: Clinical Measurements: Goal: Ability to maintain clinical measurements within normal limits will improve Outcome: Progressing Goal: Will remain free from infection Outcome: Progressing Goal: Diagnostic test results will improve Outcome: Progressing Goal: Respiratory complications will improve Outcome: Progressing Goal: Cardiovascular complication will be avoided Outcome: Progressing   Problem: Activity: Goal: Risk for activity intolerance will decrease Outcome: Progressing   Problem: Nutrition: Goal: Adequate nutrition will be maintained Outcome: Progressing   Problem: Coping: Goal: Level of anxiety will decrease Outcome: Progressing   Problem: Elimination: Goal: Will not experience complications related to bowel motility Outcome: Progressing Goal: Will not experience complications related to urinary retention Outcome: Progressing   Problem: Pain Managment: Goal: General experience of comfort will improve Outcome: Progressing   Problem: Safety: Goal: Ability to remain free from injury will improve Outcome: Progressing   Problem: Skin Integrity: Goal: Risk for impaired skin integrity will decrease Outcome: Progressing   Problem: Activity: Goal: Ability to perform activities at highest level will improve 05/08/2020 2357 by Ronalee Red, RN Outcome: Progressing 05/08/2020 2356 by Ronalee Red, RN Outcome: Progressing Goal: Muscle strength will improve 05/08/2020 2357 by Ronalee Red, RN Outcome: Progressing 05/08/2020 2356 by Ronalee Red, RN Outcome: Progressing   Problem: Bowel/Gastric: Goal: Ability to  demonstrate the techniques of an individualized bowel program will improve 05/08/2020 2357 by Ronalee Red, RN Outcome: Progressing 05/08/2020 2356 by Ronalee Red, RN Outcome: Progressing   Problem: Education: Goal: Knowledge of disease or condition will improve 05/08/2020 2357 by Ronalee Red, RN Outcome: Progressing 05/08/2020 2356 by Ronalee Red, RN Outcome: Progressing Goal: Knowledge of the prescribed therapeutic regimen will improve 05/08/2020 2357 by Ronalee Red, RN Outcome: Progressing 05/08/2020 2356 by Ronalee Red, RN Outcome: Progressing   Problem: Coping: Goal: Ability to identify and develop effective coping behavior will improve 05/08/2020 2357 by Ronalee Red, RN Outcome: Progressing 05/08/2020 2356 by Ronalee Red, RN Outcome: Progressing Goal: Ability to verbalize feelings will improve 05/08/2020 2357 by Ronalee Red, RN Outcome: Progressing 05/08/2020 2356 by Ronalee Red, RN Outcome: Progressing   Problem: Self-Care: Goal: Ability to participate in self-care as condition permits will improve 05/08/2020 2357 by Ronalee Red, RN Outcome: Progressing 05/08/2020 2356 by Ronalee Red, RN Outcome: Progressing   Problem: Skin Integrity: Goal: Risk for impaired skin integrity will decrease 05/08/2020 2357 by Ronalee Red, RN Outcome: Progressing 05/08/2020 2356 by Ronalee Red, RN Outcome: Progressing   Problem: Urinary Elimination: Goal: Ability to achieve a regular elimination pattern will improve 05/08/2020 2357 by Ronalee Red, RN Outcome: Progressing 05/08/2020 2356 by Ronalee Red, RN Outcome: Progressing   Problem: Education: Goal: Ability to verbalize activity precautions or restrictions will improve 05/08/2020 2357 by Ronalee Red, RN Outcome: Progressing 05/08/2020 2356 by Ronalee Red, RN Outcome: Progressing Goal: Knowledge of the prescribed therapeutic regimen will improve 05/08/2020 2357 by Ronalee Red, RN Outcome:  Progressing 05/08/2020 2356 by Ronalee Red, RN Outcome: Progressing Goal: Understanding of discharge needs will improve 05/08/2020 2357 by Ronalee Red, RN Outcome: Progressing 05/08/2020 2356 by Ronalee Red, RN Outcome: Progressing   Problem: Activity: Goal: Ability to avoid complications of mobility impairment will improve 05/08/2020 2357 by  Lennox Grumbles, RN Outcome: Progressing 05/08/2020 2356 by Lennox Grumbles, RN Outcome: Progressing Goal: Ability to tolerate increased activity will improve 05/08/2020 2357 by Lennox Grumbles, RN Outcome: Progressing 05/08/2020 2356 by Lennox Grumbles, RN Outcome: Progressing Goal: Will remain free from falls 05/08/2020 2357 by Lennox Grumbles, RN Outcome: Progressing 05/08/2020 2356 by Lennox Grumbles, RN Outcome: Progressing   Problem: Bowel/Gastric: Goal: Gastrointestinal status for postoperative course will improve 05/08/2020 2357 by Lennox Grumbles, RN Outcome: Progressing 05/08/2020 2356 by Lennox Grumbles, RN Outcome: Progressing   Problem: Clinical Measurements: Goal: Ability to maintain clinical measurements within normal limits will improve 05/08/2020 2357 by Lennox Grumbles, RN Outcome: Progressing 05/08/2020 2356 by Lennox Grumbles, RN Outcome: Progressing Goal: Postoperative complications will be avoided or minimized 05/08/2020 2357 by Lennox Grumbles, RN Outcome: Progressing 05/08/2020 2356 by Lennox Grumbles, RN Outcome: Progressing Goal: Diagnostic test results will improve 05/08/2020 2357 by Lennox Grumbles, RN Outcome: Progressing 05/08/2020 2356 by Lennox Grumbles, RN Outcome: Progressing   Problem: Pain Management: Goal: Pain level will decrease 05/08/2020 2357 by Lennox Grumbles, RN Outcome: Progressing 05/08/2020 2356 by Lennox Grumbles, RN Outcome: Progressing   Problem: Skin Integrity: Goal: Will show signs of wound healing 05/08/2020 2357 by Lennox Grumbles, RN Outcome: Progressing 05/08/2020 2356 by Lennox Grumbles, RN Outcome:  Progressing   Problem: Health Behavior/Discharge Planning: Goal: Identification of resources available to assist in meeting health care needs will improve 05/08/2020 2357 by Lennox Grumbles, RN Outcome: Progressing 05/08/2020 2356 by Lennox Grumbles, RN Outcome: Progressing   Problem: Bladder/Genitourinary: Goal: Urinary functional status for postoperative course will improve 05/08/2020 2357 by Lennox Grumbles, RN Outcome: Progressing 05/08/2020 2356 by Lennox Grumbles, RN Outcome: Progressing

## 2020-05-08 NOTE — Progress Notes (Signed)
   Providing Compassionate, Quality Care - Together  NEUROSURGERY PROGRESS NOTE   S: No issues overnight. Complains of incisional pain, no leg pain, some perineal numbness remains. ambulated 80 ft this am  O: EXAM:  BP (!) 147/69 (BP Location: Right Arm)   Pulse 74   Temp 98 F (36.7 C) (Oral)   Resp 17   Ht 5\' 7"  (1.702 m)   Wt 77 kg   SpO2 95%   BMI 26.59 kg/m    Awake, alert Speech fluentt Face is symmetric 5/5 BUE 5/5 BLE  Sensory intact light touch Incision clean dry and intact, Hemovac in place  ASSESSMENT:  87 y.o. male with  1.  L2 unstable burst fracture with severe stenosis  -Status post L1-3 posterior lateral instrumentation and fusion with decompression on 05/07/2020  PLAN: -PT/OT, LSO brace when out of bed -Hemovac not to suction -Pain control -SubQ heparin starting tonight -Hold Eliquis for 12 to 14 days -Does not need brace while in bed, no longer needs to be logrolled or kept flat -will take patient onto my service    Thank you for allowing me to participate in this patient's care.  Please do not hesitate to call with questions or concerns.   07/05/2020, DO Neurosurgeon [facility] Cell: [phone]

## 2020-05-08 NOTE — TOC Transition Note (Addendum)
Transition of Care [facility]) - CM/SW Discharge Note   Patient Details  Name: Thomas [name] MRN: [id number] Date of Birth: 09/25/1932  Transition of [facility]) CM/SW Contact:  Durenda Guthrie, RN Phone Number: 05/08/2020, 10:54 AM   Clinical Narrative:   Patient is a 87 yr old male s/p L1-2 posterior lateral Instrumentation and decompressino of L2 Burst Fracture. Case manager spoke with patient's wife concerning discharge needs. Choice for Home Health agency was offered. She has no preference and gave permission for CM to contact agencies. CM called [facility] and was informed they could not accept patient due to staffing. CM contacted Lorenza Chick with [facility] and is awaiting response.  11:07AM [facility] is able to provide therapy for patient.    Final next level of care: Home w Home Health Services Barriers to Discharge: No Barriers Identified   Patient Goals and CMS Choice Patient states their goals for this hospitalization and ongoing recovery are:: per wife to recover CMS Medicare.gov Compare Post Acute Care list provided to:: Patient Represenative (must comment) (wife) Choice offered to / list presented to : Spouse  Discharge Placement                       Discharge Plan and Services In-house Referral: NA Discharge Planning Services: CM Consult Post Acute Care Choice: Home Health          DME Arranged: N/A         HH Arranged: PT HH Agency: [facility] Date [facility] Agency Contacted: 05/08/20 Time HH Agency Contacted: [phone]    Social Determinants of Health (SDOH) Interventions     Readmission Risk Interventions No flowsheet data found.

## 2020-05-09 ENCOUNTER — Other Ambulatory Visit: Payer: Self-pay

## 2020-05-09 MED ORDER — METHOCARBAMOL 750 MG PO TABS
750.0000 mg | ORAL_TABLET | Freq: Three times a day (TID) | ORAL | Status: DC | PRN
  Administered 2020-05-09 – 2020-05-11 (×4): 750 mg via ORAL
  Filled 2020-05-09 (×4): qty 1

## 2020-05-09 MED ORDER — OXYCODONE HCL 5 MG PO TABS
5.0000 mg | ORAL_TABLET | Freq: Four times a day (QID) | ORAL | Status: DC | PRN
Start: 2020-05-09 — End: 2020-05-11
  Administered 2020-05-09 – 2020-05-10 (×3): 5 mg via ORAL
  Filled 2020-05-09 (×4): qty 1

## 2020-05-09 MED ORDER — VANCOMYCIN HCL 750 MG/150ML IV SOLN
750.0000 mg | INTRAVENOUS | Status: DC
  Filled 2020-05-09: qty 150

## 2020-05-09 MED ORDER — OXYCODONE-ACETAMINOPHEN 7.5-325 MG PO TABS
1.0000 | ORAL_TABLET | ORAL | Status: DC | PRN
  Administered 2020-05-09 – 2020-05-11 (×10): 2 via ORAL
  Administered 2020-05-11: 1 via ORAL
  Filled 2020-05-09 (×12): qty 2

## 2020-05-09 NOTE — Progress Notes (Signed)
Pharmacy Antibiotic Note  Thomas [name] is a 87 y.o. male admitted on 05/04/2020 for back surgery.  Patient is s/p L1-3 posterior lateral fusion w/ decompression 1/2.  Patient received vancomycin 1g pre-operatively at 07:10 this morning.  Pharmacy has been consulted to start post-operative vancomycin dosing 12 hours post-op while Hemovac drain in place.    Pt has been on vanc D3 for post procedure prophylaxis. He still has a drain in place. We will cont to keep vanc on board for now and lower dose since this is for prophylaxis.   Plan: Decrease vanc to 750mg  IV q24  Follow-up for drain removal and length of therapy  Height: 5\' 7"  (170.2 cm) Weight: 77 kg (169 lb 12.1 oz) IBW/kg (Calculated) : 66.1  Temp (24hrs), Avg:98 F (36.7 C), Min:97.7 F (36.5 C), Max:98.3 F (36.8 C)  Recent Labs  Lab 05/03/20 1714 05/04/20 0354 05/05/20 0349 05/06/20 0800 05/07/20 0251 05/07/20 1545 05/08/20 0140  WBC 9.8 12.7* 10.3  --  7.6 7.7  --   CREATININE 1.48* 1.59* 1.61* 1.31* 1.28* 1.16 1.34*    Estimated Creatinine Clearance: 37.7 mL/min (A) (by C-G formula based on SCr of 1.34 mg/dL (H)).    Allergies  Allergen Reactions  . Iodine Hives and Other (See Comments)    IVP contrast  . Penicillins Itching, Rash and Other (See Comments)    ITCHY FEELING IN FINGERS Has patient had a PCN reaction causing immediate rash, facial/tongue/throat swelling, SOB or lightheadedness with hypotension: No Has patient had a PCN reaction causing severe rash involving mucus membranes or skin necrosis: No Has patient had a PCN reaction that required hospitalization No Has patient had a PCN reaction occurring within the last 10 years: No If all of the above answers are "NO", then may proceed with Cephalosporin use.     Antimicrobials this admission: Vancomycin 1/2 >>    Microbiology results: N/A  07/05/20, PharmD, BCIDP, AAHIVP, CPP Infectious Disease Pharmacist 05/09/2020 9:01 AM

## 2020-05-09 NOTE — Progress Notes (Signed)
   Providing Compassionate, Quality Care - Together  NEUROSURGERY PROGRESS NOTE   S: No issues overnight.  Pain controlled, no new complaints  O: EXAM:  BP (!) 131/57 (BP Location: Left Arm)   Pulse 76   Temp 98.3 F (36.8 C) (Oral)   Resp 16   Ht 5\' 7"  (1.702 m)   Wt 77 kg   SpO2 95%   BMI 26.59 kg/m   Awake, alert, oriented  Speech fluent, appropriate  CNs grossly intact  SI LT Incision clean dry and intact, Hemovac in place, serosanguineous drainage Full strength BUE/BLE   ASSESSMENT:  87 y.o. male with  1.L2 unstable burst fracture with severe stenosis  -Status post L1-3 posterior lateral instrumentation and fusion with decompression on 05/07/2020  PLAN: -PT/OT, LSO brace when out of bed -Hemovac removed -Pain control -SubQheparin -Hold Eliquis for 12 to 14 days -Does not need brace while in bed, no longer needs to be logrolled or kept flat -DC Foley, eval for voiding -Bowel regimen -DC Dilaudid, discharge planning tomorrow with home health care    Thank you for allowing me to participate in this patient's care.  Please do not hesitate to call with questions or concerns.   07/05/2020, DO Neurosurgeon [facility] Cell: [phone]

## 2020-05-09 NOTE — Progress Notes (Signed)
Pt amb in room at this time. Pt tolerated activity, but w/ c/o neck discomfort and left hip muscle cramping. Pre pain med admin, Back brace, and soft neck sling on w/ activity.
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
## 2020-05-09 NOTE — Progress Notes (Addendum)
Pt refusing to ambulate this shift pt also refusing conti IVF d/t wanting to sleep more w/o interruption. Pt prefers not to amb this shift d/t "amb x3 during day shift".

## 2020-05-10 DIAGNOSIS — I4891 Unspecified atrial fibrillation: Secondary | ICD-10-CM

## 2020-05-10 MED ORDER — LIDOCAINE 5 % EX PTCH
1.0000 | MEDICATED_PATCH | CUTANEOUS | Status: DC
  Administered 2020-05-10 – 2020-05-11 (×2): 1 via TRANSDERMAL
  Filled 2020-05-10 (×2): qty 1

## 2020-05-10 MED ORDER — DOCUSATE SODIUM 100 MG PO CAPS: 100.0000 mg | ORAL_CAPSULE | Freq: Every day | ORAL | Status: DC | PRN

## 2020-05-10 MED ORDER — LIDOCAINE HCL URETHRAL/MUCOSAL 2 % EX GEL
1.0000 "application " | Freq: Once | CUTANEOUS | Status: AC
  Administered 2020-05-10: 1 via URETHRAL
  Filled 2020-05-10 (×2): qty 11

## 2020-05-10 NOTE — Progress Notes (Signed)
[facility] will sign off.   Medication Recommendations:  Resume apixaban once cleared from surgical standpoint. Continue home beta blocker Other recommendations (labs, testing, etc):  No further cardiac testing needed Follow up as an outpatient:  Will arrange for follow-up with Cardiology post-discharge.  Laurance Flatten, MD
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
## 2020-05-10 NOTE — Progress Notes (Signed)
   Providing Compassionate, Quality Care - Together  NEUROSURGERY PROGRESS NOTE   S: No issues overnight. Min BM, pain more controlled now with percocet, no voiding, straight cath x2  O: EXAM:  BP (!) 143/62 (BP Location: Right Arm)   Pulse 61   Temp 98.1 F (36.7 C) (Oral)   Resp 18   Ht 5\' 7"  (1.702 m)   Wt 77 kg   SpO2 98%   BMI 26.59 kg/m   Awake, alert, oriented  Speech fluent, appropriate  CNs grossly intact  5/5 BUE/BLE  SILT Incision c/d/i  ASSESSMENT:  87 y.o. male with  1.L2 unstable burst fracture with severe stenosis 2.  Urinary retention  -Status post L1-3 posterior lateral instrumentation and fusion with decompression on 05/07/2020  PLAN: -PT/OT, LSO brace when out of bed -Pain control -SubQheparin -Hold Eliquis for 12 to 14 days -Does not need brace while in bed, no longer needs to be logrolled or kept flat -d/w urology on call (Dr. 07/05/2020), can straight cath at home or have foley, will d/w patient -Bowel regimen, enema today -mobilize   Thank you for allowing me to participate in this patient's care.  Please do not hesitate to call with questions or concerns.   Laverle Patter, DO Neurosurgeon [facility] Cell: [phone]
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
## 2020-05-11 MED ORDER — TAMSULOSIN HCL 0.4 MG PO CAPS: 0.4000 mg | ORAL_CAPSULE | Freq: Every day | ORAL | 1 refills | Status: DC

## 2020-05-11 MED ORDER — OXYCODONE-ACETAMINOPHEN 7.5-325 MG PO TABS: 1.0000 | ORAL_TABLET | ORAL | 0 refills | Status: DC | PRN

## 2020-05-11 MED ORDER — LIDOCAINE 5 % EX PTCH: 1.0000 | MEDICATED_PATCH | CUTANEOUS | 0 refills | Status: DC

## 2020-05-11 MED ORDER — OXYCODONE HCL 5 MG PO TABS: 5.0000 mg | ORAL_TABLET | Freq: Four times a day (QID) | ORAL | 0 refills | Status: DC | PRN

## 2020-05-11 MED ORDER — METHOCARBAMOL 750 MG PO TABS: 750.0000 mg | ORAL_TABLET | Freq: Three times a day (TID) | ORAL | 2 refills | Status: DC | PRN

## 2020-05-11 NOTE — Discharge Instructions (Signed)
Lumbar Spine Fracture A lumbar spine fracture is a break in one of the bones of the lower back. Lumbar spine fractures can vary from mild to severe. The most severe types are those that:  Cause the broken bones to move out of place (unstable).  Injure or press on the spinal cord. During recovery, it is normal to have pain and stiffness in the lower back for weeks. What are the causes? This condition may be caused by:  A fall.  A car accident.  A gunshot wound.  A hard, direct hit to the back. What increases the risk? You are more likely to develop this condition if:  You are in a situation that could result in a fall or other violent injury.  You have a condition that causes weakness in the bones (osteoporosis). What are the signs or symptoms? The main symptom of this condition is severe pain in the lower back. If a fracture is complex or severe, there may also be:  A misshapen or swollen area on the lower back.  Limited ability to move an area of the lower back.  Inability to empty the bladder (urinary retention).  Loss of bowel or bladder control (incontinence).  Loss of strength or sensation in the legs, feet, and toes.  Inability to move (paralysis). How is this diagnosed? This condition is diagnosed based on:  A physical exam.  Symptoms and what happened just before they developed.  The results of imaging tests, such as an X-ray, CT scan, or MRI. If your nerves have been damaged, you may also have other tests to find out the extent of the damage. How is this treated? Treatment for this condition depends on how severe the injury is. Most fractures can be treated with:  A back brace.  Bed rest and activity restrictions.  Pain medicine.  Physical therapy. Fractures that are complex, involve multiple bones, or make the spine unstable may require surgery. Surgery is done:  To remove pressure from the nerves or spinal cord.  To stabilize the broken pieces of  bone. Follow these instructions at home: Medicines  Take over-the-counter and prescription medicines only as told by your health care provider.  Do not drive or use heavy machinery while taking prescription pain medicine.  If you are taking prescription pain medicine, take actions to prevent or treat constipation. Your health care provider may recommend that you: ? Drink enough fluid to keep your urine pale yellow. ? Eat foods that are high in fiber, such as fresh fruits and vegetables, whole grains, and beans. ? Limit foods that are high in fat and processed sugars, such as fried or sweet foods. ? Take an over-the-counter or prescription medicine for constipation. If you have a brace:  Wear the back brace as told by your health care provider. Remove it only as told by your health care provider.  Keep the brace clean.  If the brace is not waterproof: ? Do not let it get wet. ? Cover it with a watertight covering when you take a bath or a shower. Activity  Stay in bed (on bed rest) only as directed by your health care provider.  Do exercises to improve motion and strength in your back (physical therapy), if your health care provider tells you to do so.  Return to your normal activities as directed by your health care provider. Ask your health care provider what activities are safe for you. Managing pain, stiffness, and swelling   If directed, put ice  on the injured area: ? If you have a removable brace, remove it as told by your health care provider. ? Put ice in a plastic bag. ? Place a towel between your skin and the bag. ? Leave the ice on for 20 minutes, 2-3 times a day. General instructions  Do not use any products that contain nicotine or tobacco, such as cigarettes and e-cigarettes. These can delay healing after injury. If you need help quitting, ask your health care provider.  Do not drink alcohol. Alcohol can interfere with your treatment.  Keep all follow-up visits  as directed by your health care provider. This is important. ? Failing to follow up as recommended could result in permanent injury, disability, or long-lasting (chronic) pain. Contact a health care provider if:  You have a fever.  Your pain medicine is not helping.  Your pain does not get better over time.  You cannot return to your normal activities as planned or expected. Get help right away if:  You have difficulty breathing.  Your pain is very bad and it suddenly gets worse.  You have numbness, tingling, or weakness in any part of your body.  You are unable to empty your bladder.  You cannot control your bladder or bowels.  You are unable to move any body part (paralysis) that is below the level of your injury.  You vomit.  You have pain in your abdomen. Summary  A lumbar spine fracture is a break in one of the bones of the lower back.  The main symptom of this condition is severe pain in the lower back. If a fracture is complex, there may also be numbness, tingling, or paralysis in the legs.  Treatment depends on how severe the injury is. Most fractures can be treated with a back brace, bed rest and activity restrictions, pain medicine, and physical therapy.  Fractures that are complex, involve multiple bones, or make the spine unstable may require surgery. This information is not intended to replace advice given to you by your health care provider. Make sure you discuss any questions you have with your health care provider. Document Revised: 06/07/2017 Document Reviewed: 06/07/2017 Elsevier Patient Education  2020 [employer]   **Your blood thinner, apixaban (Eliquis) was held after surgery.  Can likely resume in 12-14 days.  Please discuss with cardiology and neurosurgery at follow-up appointment before restarting.

## 2020-05-11 NOTE — Plan of Care (Signed)
  Problem: Activity: Goal: Ability to perform activities at highest level will improve Outcome: Progressing Goal: Muscle strength will improve Outcome: Progressing   Problem: Bowel/Gastric: Goal: Ability to demonstrate the techniques of an individualized bowel program will improve Outcome: Progressing   Problem: Education: Goal: Knowledge of disease or condition will improve Outcome: Progressing Goal: Knowledge of the prescribed therapeutic regimen will improve Outcome: Progressing   Problem: Coping: Goal: Ability to identify and develop effective coping behavior will improve Outcome: Progressing Goal: Ability to verbalize feelings will improve Outcome: Progressing   Problem: Self-Care: Goal: Ability to participate in self-care as condition permits will improve Outcome: Progressing   Problem: Skin Integrity: Goal: Risk for impaired skin integrity will decrease Outcome: Progressing   Problem: Urinary Elimination: Goal: Ability to achieve a regular elimination pattern will improve Outcome: Progressing   Problem: Education: Goal: Ability to verbalize activity precautions or restrictions will improve Outcome: Progressing Goal: Knowledge of the prescribed therapeutic regimen will improve Outcome: Progressing Goal: Understanding of discharge needs will improve Outcome: Progressing   Problem: Activity: Goal: Ability to avoid complications of mobility impairment will improve Outcome: Progressing Goal: Ability to tolerate increased activity will improve Outcome: Progressing Goal: Will remain free from falls Outcome: Progressing   Problem: Bowel/Gastric: Goal: Gastrointestinal status for postoperative course will improve Outcome: Progressing   Problem: Clinical Measurements: Goal: Ability to maintain clinical measurements within normal limits will improve Outcome: Progressing Goal: Postoperative complications will be avoided or minimized Outcome: Progressing Goal:  Diagnostic test results will improve Outcome: Progressing   Problem: Pain Management: Goal: Pain level will decrease Outcome: Progressing   Problem: Skin Integrity: Goal: Will show signs of wound healing Outcome: Progressing   Problem: Health Behavior/Discharge Planning: Goal: Identification of resources available to assist in meeting health care needs will improve Outcome: Progressing   Problem: Bladder/Genitourinary: Goal: Urinary functional status for postoperative course will improve Outcome: Progressing

## 2020-05-11 NOTE — Care Management Important Message (Signed)
Important Message  Patient Details  Name: ROOSEVELT EIMERS MRN: [id number] Date of Birth: Oct 05, 1932   Medicare Important Message Given:  Yes     Trapper Meech Stefan Church 05/11/2020, 9:59 AM

## 2020-05-11 NOTE — Progress Notes (Signed)
Pt requesting to have less night interruptions this shift. Pt wishing to have vital signs and labs to be held off until after 0700. On-call w/ Neurosurgery paged earlier this shift pertaining to pt's concerns w/o return call. Pt allowed 2000 and 0000 vitals to be got. Will conti to monitor pt's willingness.
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
## 2020-05-11 NOTE — TOC Transition Note (Signed)
Transition of Care [facility]) - CM/SW Discharge Note   Patient Details  Name: Thomas [name] MRN: [id number] Date of Birth: 21-Feb-1933  [facility]) CM/SW Contact:  Kermit Balo, RN Phone Number: 05/11/2020, 11:59 AM   Clinical Narrative:    Pt discharging home with [facility] services through [place] today. Cory with [facility] updated.  DME for home has been delivered to the room.  Pt has transportation home.   Final next level of care: Home w Home Health Services Barriers to Discharge: No Barriers Identified   Patient Goals and CMS Choice Patient states their goals for this hospitalization and ongoing recovery are:: per wife to recover CMS Medicare.gov Compare Post Acute Care list provided to:: Patient Represenative (must comment) (wife) Choice offered to / list presented to : Spouse  Discharge Placement                       Discharge Plan and Services In-house Referral: NA Discharge Planning Services: CM Consult Post Acute Care Choice: Home Health          DME Arranged: N/A         HH Arranged: PT HH Agency: [facility] Date [facility] Agency Contacted: 05/08/20 Time HH Agency Contacted: [phone]    Social Determinants of Health (SDOH) Interventions     Readmission Risk Interventions No flowsheet data found.
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
## 2020-05-12 ENCOUNTER — Encounter: Payer: Self-pay | Admitting: Urology
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
## 2020-05-16 NOTE — Progress Notes (Signed)
Remote pacemaker transmission.   

## 2020-05-17 DIAGNOSIS — S161XXD Strain of muscle, fascia and tendon at neck level, subsequent encounter: Secondary | ICD-10-CM | POA: Diagnosis not present

## 2020-05-17 DIAGNOSIS — G8929 Other chronic pain: Secondary | ICD-10-CM | POA: Diagnosis not present

## 2020-05-17 DIAGNOSIS — S32022D Unstable burst fracture of second lumbar vertebra, subsequent encounter for fracture with routine healing: Secondary | ICD-10-CM | POA: Diagnosis not present

## 2020-05-17 DIAGNOSIS — R338 Other retention of urine: Secondary | ICD-10-CM | POA: Diagnosis not present

## 2020-05-18 ENCOUNTER — Other Ambulatory Visit: Payer: Self-pay

## 2020-05-18 ENCOUNTER — Encounter: Payer: Self-pay | Admitting: Urology

## 2020-05-18 ENCOUNTER — Ambulatory Visit (INDEPENDENT_AMBULATORY_CARE_PROVIDER_SITE_OTHER): Payer: Medicare Other | Admitting: Urology

## 2020-05-18 ENCOUNTER — Telehealth: Payer: Self-pay

## 2020-05-18 VITALS — BP 137/77 | HR 87 | Ht 67.0 in | Wt 160.0 lb

## 2020-05-18 DIAGNOSIS — R339 Retention of urine, unspecified: Secondary | ICD-10-CM

## 2020-05-18 NOTE — Telephone Encounter (Signed)
mychart consent pending  °

## 2020-05-19 ENCOUNTER — Telehealth: Payer: Self-pay

## 2020-05-19 ENCOUNTER — Encounter: Payer: Self-pay | Admitting: Urology

## 2020-05-19 DIAGNOSIS — R338 Other retention of urine: Secondary | ICD-10-CM | POA: Diagnosis not present

## 2020-05-19 DIAGNOSIS — S32022D Unstable burst fracture of second lumbar vertebra, subsequent encounter for fracture with routine healing: Secondary | ICD-10-CM | POA: Diagnosis not present

## 2020-05-19 DIAGNOSIS — G8929 Other chronic pain: Secondary | ICD-10-CM | POA: Diagnosis not present

## 2020-05-19 DIAGNOSIS — S161XXD Strain of muscle, fascia and tendon at neck level, subsequent encounter: Secondary | ICD-10-CM | POA: Diagnosis not present
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
## 2020-05-19 NOTE — Telephone Encounter (Signed)
Copied from [place] [phone]. Topic: Quick Communication - Home Health Verbal Orders >> May 19, 2020  8:12 AM Gillis Ends D wrote: Caller/Agency: [facility] Number: [phone] Requesting OT/PT/Skilled Nursing/Social Work/Speech Therapy: PT/Home Health Aid Frequency: 2 times a week for 2 weeks and then 1 time a week for 2 weeks Starting next week and also a [place] 2 times a week for 3 weeks

## 2020-05-20 NOTE — Telephone Encounter (Signed)
ok 

## 2020-05-22 ENCOUNTER — Telehealth: Payer: Medicare Other | Admitting: Physician Assistant

## 2020-05-22 ENCOUNTER — Other Ambulatory Visit: Payer: Self-pay

## 2020-05-23 NOTE — Telephone Encounter (Signed)
Amy advised as below.

## 2020-05-25 ENCOUNTER — Telehealth: Payer: Self-pay | Admitting: Family Medicine

## 2020-05-25 NOTE — Telephone Encounter (Signed)
Home Health Verbal Orders - Caller/Agency: [facility] Number: [phone] Requesting Skilled Nursing Evaluation Frequency:

## 2020-05-26 ENCOUNTER — Other Ambulatory Visit: Payer: Self-pay | Admitting: Family Medicine

## 2020-05-26 ENCOUNTER — Other Ambulatory Visit: Payer: Self-pay | Admitting: Urology

## 2020-05-26 ENCOUNTER — Encounter: Payer: Self-pay | Admitting: Neurological Surgery

## 2020-05-26 MED ORDER — SYRINGE 2-3 ML 3 ML MISC: 1.0000 mg | 3 refills | Status: AC

## 2020-05-26 MED ORDER — "BD DISP NEEDLES 21G X 1-1/2"" MISC": 1.0000 mg | 0 refills | Status: AC

## 2020-05-26 MED ORDER — "BD DISP NEEDLES 18G X 1-1/2"" MISC": 1.0000 mg | 0 refills | Status: AC

## 2020-05-26 MED ORDER — TESTOSTERONE CYPIONATE 200 MG/ML IM SOLN: 200.0000 mg | INTRAMUSCULAR | 0 refills | Status: DC

## 2020-05-26 NOTE — Telephone Encounter (Signed)
ok 

## 2020-05-29 DIAGNOSIS — R338 Other retention of urine: Secondary | ICD-10-CM | POA: Diagnosis not present

## 2020-05-29 DIAGNOSIS — S32022D Unstable burst fracture of second lumbar vertebra, subsequent encounter for fracture with routine healing: Secondary | ICD-10-CM | POA: Diagnosis not present

## 2020-05-29 DIAGNOSIS — S161XXD Strain of muscle, fascia and tendon at neck level, subsequent encounter: Secondary | ICD-10-CM | POA: Diagnosis not present

## 2020-05-29 DIAGNOSIS — G8929 Other chronic pain: Secondary | ICD-10-CM | POA: Diagnosis not present

## 2020-05-29 NOTE — Telephone Encounter (Signed)
Verbal okay was given. 

## 2020-05-30 DIAGNOSIS — R338 Other retention of urine: Secondary | ICD-10-CM | POA: Diagnosis not present

## 2020-05-30 DIAGNOSIS — G8929 Other chronic pain: Secondary | ICD-10-CM | POA: Diagnosis not present

## 2020-05-30 DIAGNOSIS — S161XXD Strain of muscle, fascia and tendon at neck level, subsequent encounter: Secondary | ICD-10-CM | POA: Diagnosis not present

## 2020-05-30 DIAGNOSIS — S32022D Unstable burst fracture of second lumbar vertebra, subsequent encounter for fracture with routine healing: Secondary | ICD-10-CM | POA: Diagnosis not present

## 2020-06-01 DIAGNOSIS — S32022D Unstable burst fracture of second lumbar vertebra, subsequent encounter for fracture with routine healing: Secondary | ICD-10-CM | POA: Diagnosis not present

## 2020-06-01 DIAGNOSIS — G8929 Other chronic pain: Secondary | ICD-10-CM | POA: Diagnosis not present

## 2020-06-01 DIAGNOSIS — R338 Other retention of urine: Secondary | ICD-10-CM | POA: Diagnosis not present

## 2020-06-01 DIAGNOSIS — S161XXD Strain of muscle, fascia and tendon at neck level, subsequent encounter: Secondary | ICD-10-CM | POA: Diagnosis not present

## 2020-06-05 DIAGNOSIS — R338 Other retention of urine: Secondary | ICD-10-CM | POA: Diagnosis not present

## 2020-06-05 DIAGNOSIS — S161XXD Strain of muscle, fascia and tendon at neck level, subsequent encounter: Secondary | ICD-10-CM | POA: Diagnosis not present

## 2020-06-05 DIAGNOSIS — G8929 Other chronic pain: Secondary | ICD-10-CM | POA: Diagnosis not present

## 2020-06-05 DIAGNOSIS — S32022D Unstable burst fracture of second lumbar vertebra, subsequent encounter for fracture with routine healing: Secondary | ICD-10-CM | POA: Diagnosis not present

## 2020-06-07 DIAGNOSIS — S32029G Unspecified fracture of second lumbar vertebra, subsequent encounter for fracture with delayed healing: Secondary | ICD-10-CM | POA: Diagnosis not present

## 2020-06-09 DIAGNOSIS — S32022D Unstable burst fracture of second lumbar vertebra, subsequent encounter for fracture with routine healing: Secondary | ICD-10-CM | POA: Diagnosis not present

## 2020-06-09 DIAGNOSIS — S161XXD Strain of muscle, fascia and tendon at neck level, subsequent encounter: Secondary | ICD-10-CM | POA: Diagnosis not present

## 2020-06-09 DIAGNOSIS — G8929 Other chronic pain: Secondary | ICD-10-CM | POA: Diagnosis not present

## 2020-06-09 DIAGNOSIS — R338 Other retention of urine: Secondary | ICD-10-CM | POA: Diagnosis not present

## 2020-06-14 DIAGNOSIS — S161XXD Strain of muscle, fascia and tendon at neck level, subsequent encounter: Secondary | ICD-10-CM | POA: Diagnosis not present

## 2020-06-14 DIAGNOSIS — R338 Other retention of urine: Secondary | ICD-10-CM | POA: Diagnosis not present

## 2020-06-14 DIAGNOSIS — S32022D Unstable burst fracture of second lumbar vertebra, subsequent encounter for fracture with routine healing: Secondary | ICD-10-CM | POA: Diagnosis not present

## 2020-06-14 DIAGNOSIS — G8929 Other chronic pain: Secondary | ICD-10-CM | POA: Diagnosis not present

## 2020-06-16 DIAGNOSIS — R338 Other retention of urine: Secondary | ICD-10-CM | POA: Diagnosis not present

## 2020-06-16 DIAGNOSIS — G8929 Other chronic pain: Secondary | ICD-10-CM | POA: Diagnosis not present

## 2020-06-16 DIAGNOSIS — S32022D Unstable burst fracture of second lumbar vertebra, subsequent encounter for fracture with routine healing: Secondary | ICD-10-CM | POA: Diagnosis not present

## 2020-06-16 DIAGNOSIS — S161XXD Strain of muscle, fascia and tendon at neck level, subsequent encounter: Secondary | ICD-10-CM | POA: Diagnosis not present

## 2020-06-20 ENCOUNTER — Other Ambulatory Visit: Payer: Self-pay

## 2020-06-20 ENCOUNTER — Encounter: Payer: Self-pay | Admitting: Urology

## 2020-06-20 ENCOUNTER — Ambulatory Visit (INDEPENDENT_AMBULATORY_CARE_PROVIDER_SITE_OTHER): Payer: Medicare Other | Admitting: Urology

## 2020-06-20 VITALS — BP 130/77 | HR 74 | Ht 70.0 in | Wt 160.0 lb

## 2020-06-20 DIAGNOSIS — N401 Enlarged prostate with lower urinary tract symptoms: Secondary | ICD-10-CM | POA: Diagnosis not present

## 2020-06-20 DIAGNOSIS — R338 Other retention of urine: Secondary | ICD-10-CM
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
## 2020-06-21 DIAGNOSIS — S32022D Unstable burst fracture of second lumbar vertebra, subsequent encounter for fracture with routine healing: Secondary | ICD-10-CM | POA: Diagnosis not present

## 2020-06-21 DIAGNOSIS — G8929 Other chronic pain: Secondary | ICD-10-CM | POA: Diagnosis not present

## 2020-06-21 DIAGNOSIS — R338 Other retention of urine: Secondary | ICD-10-CM | POA: Diagnosis not present

## 2020-06-21 DIAGNOSIS — S161XXD Strain of muscle, fascia and tendon at neck level, subsequent encounter: Secondary | ICD-10-CM | POA: Diagnosis not present

## 2020-06-23 ENCOUNTER — Encounter: Payer: Self-pay | Admitting: Cardiovascular Disease

## 2020-06-23 ENCOUNTER — Other Ambulatory Visit: Payer: Self-pay

## 2020-06-23 ENCOUNTER — Ambulatory Visit: Payer: Medicare Other | Admitting: Cardiovascular Disease

## 2020-06-23 VITALS — BP 138/68 | HR 67 | Ht 70.0 in | Wt 150.0 lb

## 2020-06-23 DIAGNOSIS — I10 Essential (primary) hypertension: Secondary | ICD-10-CM

## 2020-06-23 DIAGNOSIS — I480 Paroxysmal atrial fibrillation: Secondary | ICD-10-CM

## 2020-06-23 DIAGNOSIS — I4819 Other persistent atrial fibrillation: Secondary | ICD-10-CM

## 2020-06-23 DIAGNOSIS — S32001A Stable burst fracture of unspecified lumbar vertebra, initial encounter for closed fracture: Secondary | ICD-10-CM | POA: Diagnosis not present

## 2020-06-23 DIAGNOSIS — I442 Atrioventricular block, complete: Secondary | ICD-10-CM | POA: Diagnosis not present

## 2020-06-23 MED ORDER — APIXABAN 5 MG PO TABS: 5.0000 mg | ORAL_TABLET | Freq: Two times a day (BID) | ORAL | 6 refills | Status: DC

## 2020-06-23 NOTE — Patient Instructions (Signed)
Medication Instructions:  No changes  If you need a refill on your cardiac medications before your next appointment, please call your pharmacy.    Lab work: No new labs needed   If you have labs (blood work) drawn today and your tests are completely normal, you will receive your results only by: . MyChart Message (if you have MyChart) OR . A paper copy in the mail If you have any lab test that is abnormal or we need to change your treatment, we will call you to review the results.   Testing/Procedures: No new testing needed   Follow-Up: At [facility], you and your health needs are our priority.  As part of our continuing mission to provide you with exceptional heart care, we have created designated Provider Care Teams.  These Care Teams include your primary Cardiologist (physician) and Advanced Practice Providers (APPs -  Physician Assistants and Nurse Practitioners) who all work together to provide you with the care you need, when you need it.  . You will need a follow up appointment in 12 months  . Providers on your designated Care Team:   . Christopher Berge, NP . Ryan Dunn, PA-C . Jacquelyn Visser, PA-C  Any Other Special Instructions Will Be Listed Below (If Applicable).  COVID-19 Vaccine Information can be found at: [email or web address] For questions related to vaccine distribution or appointments, please email [email or web address] or call [phone].     
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
## 2020-06-25 ENCOUNTER — Encounter: Payer: Self-pay | Admitting: Urology

## 2020-06-26 ENCOUNTER — Encounter: Payer: Self-pay | Admitting: Radiology

## 2020-06-26 ENCOUNTER — Other Ambulatory Visit: Payer: Self-pay | Admitting: Radiology

## 2020-06-26 ENCOUNTER — Telehealth: Payer: Self-pay

## 2020-06-26 NOTE — Telephone Encounter (Signed)
   [facility] Pre-operative Risk Assessment    HEARTCARE STAFF: - Please ensure there is not already an duplicate clearance open for this procedure. - Under Visit Info/Reason for Call, type in Other and utilize the format Clearance MM/DD/YY or Clearance TBD. Do not use dashes or single digits. - If request is for dental extraction, please clarify the # of teeth to be extracted.  Request for surgical clearance:  1. What type of surgery is being performed? Holmium laser enucleation of the prostate   2. When is this surgery scheduled? 07/10/20   3. What type of clearance is required (medical clearance vs. Pharmacy clearance to hold med vs. Both)? pharamcy  4. Are there any medications that need to be held prior to surgery and how long? eliquis needs instructions    5. Practice name and name of physician performing surgery? [facility],    6. What is the office phone number? [phone] opt [phone]   7.   What is the office fax number? [phone]  8.   Anesthesia type (None, local, MAC, general) ? None listed   Jacinta Shoe 06/26/2020, 5:04 PM  _________________________________________________________________   (provider comments below)

## 2020-06-27 ENCOUNTER — Other Ambulatory Visit: Payer: Self-pay

## 2020-06-27 DIAGNOSIS — R338 Other retention of urine: Secondary | ICD-10-CM

## 2020-06-27 DIAGNOSIS — N401 Enlarged prostate with lower urinary tract symptoms: Secondary | ICD-10-CM

## 2020-06-27 NOTE — Telephone Encounter (Signed)
Patient with diagnosis of A Fib on Eliquis for anticoagulation.    Procedure: Holmium laser enucleation of the prostate   Date of procedure: 07/10/20   CHA2DS2-VASc Score = 6  This indicates a 9.7% annual risk of stroke. The patient's score is based upon: CHF History: No HTN History: Yes Diabetes History: No Stroke History: Yes Vascular Disease History: Yes Age Score: 2 Gender Score: 0   CrCl 39 mL/min Platelet count 128K  Dr Rockey Situ saw patient on 2/18 and recommended a 3 day Eliquis hold.

## 2020-06-27 NOTE — Telephone Encounter (Signed)
   Primary Cardiologist: [name], MD  Chart reviewed as part of pre-operative protocol coverage. Given past medical history and time since last visit, based on ACC/AHA guidelines, Thomas [name] would be at acceptable risk for the planned procedure without further cardiovascular testing.   Seen for preop evaluation 06/23/20 by Dr. Rockey Situ. Per Dr. Rockey Situ and pharmacy review may hold Eliquis 3 days prior to planned procedure. Resume Eliquis post-procedure at the direction of urology when felt safe from surgical standpoint.   The patient was advised that if he develops new symptoms prior to surgery to contact our office to arrange for a follow-up visit, and he verbalized understanding.  I will route this recommendation as well as office visit note 06/23/20 to the requesting party via [facility] fax function and remove from pre-op pool.  Please call with questions.  Loel Dubonnet, NP 06/27/2020, 10:16 AM

## 2020-06-28 ENCOUNTER — Telehealth (INDEPENDENT_AMBULATORY_CARE_PROVIDER_SITE_OTHER): Payer: Medicare Other | Admitting: Urology

## 2020-06-28 ENCOUNTER — Encounter: Payer: Self-pay | Admitting: Urology

## 2020-06-28 ENCOUNTER — Other Ambulatory Visit: Payer: Self-pay

## 2020-06-28 DIAGNOSIS — R338 Other retention of urine: Secondary | ICD-10-CM

## 2020-06-28 DIAGNOSIS — N401 Enlarged prostate with lower urinary tract symptoms: Secondary | ICD-10-CM | POA: Diagnosis not present

## 2020-06-28 NOTE — Progress Notes (Signed)
This service is provided via telemedicine   No vital signs collected/recorded due to the encounter was a telemedicine visit.     Patient consents to a telephone visit:  yes    Names of all persons participating in the telemedicine service and their role in the encounter:  Sarah Watts, CMA and Ashley Brandon, MD   

## 2020-06-30 DIAGNOSIS — G8929 Other chronic pain: Secondary | ICD-10-CM | POA: Diagnosis not present

## 2020-06-30 DIAGNOSIS — R338 Other retention of urine: Secondary | ICD-10-CM | POA: Diagnosis not present

## 2020-06-30 DIAGNOSIS — S161XXD Strain of muscle, fascia and tendon at neck level, subsequent encounter: Secondary | ICD-10-CM | POA: Diagnosis not present

## 2020-06-30 DIAGNOSIS — S32022D Unstable burst fracture of second lumbar vertebra, subsequent encounter for fracture with routine healing: Secondary | ICD-10-CM | POA: Diagnosis not present

## 2020-07-03 ENCOUNTER — Other Ambulatory Visit: Payer: Self-pay

## 2020-07-03 ENCOUNTER — Ambulatory Visit (INDEPENDENT_AMBULATORY_CARE_PROVIDER_SITE_OTHER): Payer: Medicare Other | Admitting: Family Medicine

## 2020-07-03 ENCOUNTER — Other Ambulatory Visit: Payer: Self-pay | Admitting: Radiology

## 2020-07-03 DIAGNOSIS — N401 Enlarged prostate with lower urinary tract symptoms: Secondary | ICD-10-CM

## 2020-07-03 DIAGNOSIS — R338 Other retention of urine: Secondary | ICD-10-CM

## 2020-07-03 NOTE — Progress Notes (Signed)
Patient presents today for a UA, UCX collection for pre op. Patient has a 9fr catheter in place. I disconnected the catheter bag from the catheter and plugged the catheter. Patient was given water and he sat for about 15 minutes. Urine was collected for UA,  UCX.

## 2020-07-04 ENCOUNTER — Encounter
Admission: RE | Admit: 2020-07-04 | Discharge: 2020-07-04 | Disposition: A | Discharge status: Home / Self Care | Payer: Medicare Other | Source: Ambulatory Visit | Attending: Urology | Admitting: Urology

## 2020-07-04 LAB — URINALYSIS, COMPLETE
Bilirubin, UA: NEGATIVE
Glucose, UA: NEGATIVE
Ketones, UA: NEGATIVE
Leukocytes,UA: NEGATIVE
Nitrite, UA: NEGATIVE
Protein,UA: NEGATIVE
Specific Gravity, UA: 1.015 (ref 1.005–1.030)
Urobilinogen, Ur: 0.2 mg/dL (ref 0.2–1.0)
pH, UA: 7 (ref 5.0–7.5)

## 2020-07-04 LAB — MICROSCOPIC EXAMINATION
Epithelial Cells (non renal): NONE SEEN /hpf (ref 0–10)
RBC, Urine: >30 /hpf — AB (ref 0–2)

## 2020-07-04 NOTE — Patient Instructions (Signed)
Your procedure is scheduled on: 07/10/20 Report to [facility]. To find out your arrival time please call [phone] between 1PM - 3PM on 07/07/20.  Remember: Instructions that are not followed completely may result in serious medical risk, up to and including death, or upon the discretion of your surgeon and anesthesiologist your surgery may need to be rescheduled.     _X__ 1. Do not eat food or drink liquids after midnight the night before your procedure.                  __X__2.  On the morning of surgery brush your teeth with toothpaste and water, you                 may rinse your mouth with mouthwash if you wish.  Do not swallow any              toothpaste of mouthwash.     _X__ 3.  No Alcohol for 24 hours before or after surgery.   _X__ 4.  Do Not Smoke or use e-cigarettes For 24 Hours Prior to Your Surgery.                 Do not use any chewable tobacco products for at least 6 hours prior to                 surgery.  ____  5.  Bring all medications with you on the day of surgery if instructed.   __X__  6.  Notify your doctor if there is any change in your medical condition      (cold, fever, infections).     Do not wear jewelry, make-up, hairpins, clips or nail polish. Do not wear lotions, powders, or perfumes.  Do not shave 48 hours prior to surgery. Men may shave face and neck. Do not bring valuables to the hospital.    [facility] is not responsible for any belongings or valuables.  Contacts, dentures/partials or body piercings may not be worn into surgery. Bring a case for your contacts, glasses or hearing aids, a denture cup will be supplied. Leave your suitcase in the car. After surgery it may be brought to your room. For patients admitted to the hospital, discharge time is determined by your treatment team.   Patients discharged the day of surgery will not be allowed to drive home.   Please read over the  following fact sheets that you were given:     __X__ Take these medicines the morning of surgery with A SIP OF WATER:    1. dofetilide (TIKOSYN) 125 MCG capsule  2. dutasteride (AVODART) 0.5 MG capsule  3. febuxostat (ULORIC) 40 MG tablet  4. gabapentin (NEURONTIN) 300 MG capsule  5. Oxycodone if needed  6. Propranolol if needed  ____ Fleet Enema (as directed)   ____ Use CHG Soap/SAGE wipes as directed  ____ Use inhalers on the day of surgery  ____ Stop metformin/Janumet/Farxiga 2 days prior to surgery    ____ Take 1/2 of usual insulin dose the night before surgery. No insulin the morning          of surgery.   __X__ Stop Blood Thinners ELIQUIS AS PREVIOUSLY INSTRUCTED   __X__ Stop Anti-inflammatories 7 days before surgery such as Advil, Ibuprofen, Motrin,  BC or Goodies Powder, Naprosyn, Naproxen, Aleve, Aspirin    __X__ Stop all herbal supplements, fish oil or vitamin E until after  surgery. STOP CO Q1O, TURMERIC TODAY 07/04/20   ____ Bring C-Pap to the hospital.

## 2020-07-05 DIAGNOSIS — G8929 Other chronic pain: Secondary | ICD-10-CM | POA: Diagnosis not present

## 2020-07-05 DIAGNOSIS — S161XXD Strain of muscle, fascia and tendon at neck level, subsequent encounter: Secondary | ICD-10-CM | POA: Diagnosis not present

## 2020-07-05 DIAGNOSIS — R338 Other retention of urine: Secondary | ICD-10-CM | POA: Diagnosis not present

## 2020-07-05 DIAGNOSIS — S32022D Unstable burst fracture of second lumbar vertebra, subsequent encounter for fracture with routine healing: Secondary | ICD-10-CM | POA: Diagnosis not present

## 2020-07-06 ENCOUNTER — Other Ambulatory Visit: Payer: Self-pay

## 2020-07-06 ENCOUNTER — Other Ambulatory Visit
Admission: RE | Admit: 2020-07-06 | Discharge: 2020-07-06 | Disposition: A | Discharge status: Home / Self Care | Payer: Medicare Other | Source: Ambulatory Visit | Attending: Urology | Admitting: Urology

## 2020-07-06 DIAGNOSIS — Z20822 Contact with and (suspected) exposure to covid-19: Secondary | ICD-10-CM | POA: Insufficient documentation

## 2020-07-06 DIAGNOSIS — Z01812 Encounter for preprocedural laboratory examination: Secondary | ICD-10-CM | POA: Insufficient documentation

## 2020-07-06 LAB — CULTURE, URINE COMPREHENSIVE

## 2020-07-06 LAB — SARS CORONAVIRUS 2 (TAT 6-24 HRS): SARS Coronavirus 2: NEGATIVE

## 2020-07-07 DIAGNOSIS — N320 Bladder-neck obstruction: Secondary | ICD-10-CM | POA: Diagnosis not present

## 2020-07-07 DIAGNOSIS — N401 Enlarged prostate with lower urinary tract symptoms: Secondary | ICD-10-CM

## 2020-07-07 NOTE — Telephone Encounter (Signed)
Pre Admit calling for Device clearance form status.    Unable to reach device clinic at this time.  Number and fax provided to staff and aware routing as high priority .

## 2020-07-07 NOTE — Telephone Encounter (Signed)
[facility] is calling to get status of clearance
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
## 2020-07-07 NOTE — Telephone Encounter (Signed)
Form completed and faxed to [facility] fax # [phone].

## 2020-07-07 NOTE — Telephone Encounter (Signed)
Patient previously cleared for procedure. Will re-fax clearance to requesting party as well as route to Honor Loh, NP.   Loel Dubonnet, NP
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
## 2020-07-09 MED ORDER — CEFAZOLIN SODIUM-DEXTROSE 2-4 GM/100ML-% IV SOLN
2.0000 g | INTRAVENOUS | Status: AC
  Administered 2020-07-10: 2 g via INTRAVENOUS

## 2020-07-10 ENCOUNTER — Ambulatory Visit: Payer: Medicare Other | Admitting: Anesthesiology

## 2020-07-10 ENCOUNTER — Encounter
Admission: RE | Disposition: A | Discharge status: Home / Self Care | Payer: Self-pay | Source: Home / Self Care | Attending: Urology

## 2020-07-10 ENCOUNTER — Other Ambulatory Visit: Payer: Self-pay

## 2020-07-10 ENCOUNTER — Encounter: Payer: Self-pay | Admitting: Urology

## 2020-07-10 ENCOUNTER — Ambulatory Visit
Admission: RE | Admit: 2020-07-10 | Discharge: 2020-07-10 | Disposition: A | Discharge status: Home / Self Care | Payer: Medicare Other | Attending: Urology | Admitting: Urology

## 2020-07-10 DIAGNOSIS — Z79899 Other long term (current) drug therapy: Secondary | ICD-10-CM | POA: Diagnosis not present

## 2020-07-10 DIAGNOSIS — K449 Diaphragmatic hernia without obstruction or gangrene: Secondary | ICD-10-CM | POA: Diagnosis not present

## 2020-07-10 DIAGNOSIS — N401 Enlarged prostate with lower urinary tract symptoms: Secondary | ICD-10-CM

## 2020-07-10 DIAGNOSIS — N323 Diverticulum of bladder: Secondary | ICD-10-CM | POA: Diagnosis not present

## 2020-07-10 DIAGNOSIS — R338 Other retention of urine: Secondary | ICD-10-CM | POA: Diagnosis not present

## 2020-07-10 DIAGNOSIS — I480 Paroxysmal atrial fibrillation: Secondary | ICD-10-CM | POA: Diagnosis not present

## 2020-07-10 DIAGNOSIS — Z8673 Personal history of transient ischemic attack (TIA), and cerebral infarction without residual deficits: Secondary | ICD-10-CM | POA: Diagnosis not present

## 2020-07-10 DIAGNOSIS — E785 Hyperlipidemia, unspecified: Secondary | ICD-10-CM | POA: Diagnosis not present

## 2020-07-10 DIAGNOSIS — N320 Bladder-neck obstruction: Secondary | ICD-10-CM | POA: Insufficient documentation

## 2020-07-10 DIAGNOSIS — K219 Gastro-esophageal reflux disease without esophagitis: Secondary | ICD-10-CM | POA: Diagnosis not present

## 2020-07-10 DIAGNOSIS — I10 Essential (primary) hypertension: Secondary | ICD-10-CM | POA: Diagnosis not present

## 2020-07-10 HISTORY — PX: HOLEP-LASER ENUCLEATION OF THE PROSTATE WITH MORCELLATION: SHX6641

## 2020-07-10 SURGERY — ENUCLEATION, PROSTATE, USING LASER, WITH MORCELLATION
Anesthesia: General | Site: Prostate

## 2020-07-10 MED ORDER — ONDANSETRON HCL 4 MG/2ML IJ SOLN
INTRAMUSCULAR | Status: AC
  Filled 2020-07-10: qty 4

## 2020-07-10 MED ORDER — FENTANYL CITRATE (PF) 100 MCG/2ML IJ SOLN
INTRAMUSCULAR | Status: DC | PRN
  Administered 2020-07-10 (×2): 50 ug via INTRAVENOUS

## 2020-07-10 MED ORDER — PROPOFOL 10 MG/ML IV BOLUS
INTRAVENOUS | Status: DC | PRN
  Administered 2020-07-10: 100 mg via INTRAVENOUS

## 2020-07-10 MED ORDER — SEVOFLURANE IN SOLN
RESPIRATORY_TRACT | Status: AC
  Filled 2020-07-10: qty 250

## 2020-07-10 MED ORDER — FUROSEMIDE 10 MG/ML IJ SOLN
INTRAMUSCULAR | Status: DC | PRN
  Administered 2020-07-10: 10 mg via INTRAMUSCULAR

## 2020-07-10 MED ORDER — OXYCODONE-ACETAMINOPHEN 5-325 MG PO TABS
1.0000 | ORAL_TABLET | Freq: Once | ORAL | Status: AC
  Administered 2020-07-10: 1 via ORAL

## 2020-07-10 MED ORDER — PROPOFOL 500 MG/50ML IV EMUL
INTRAVENOUS | Status: AC
  Filled 2020-07-10: qty 100

## 2020-07-10 MED ORDER — CHLORHEXIDINE GLUCONATE 0.12 % MT SOLN
OROMUCOSAL | Status: AC
  Filled 2020-07-10: qty 15

## 2020-07-10 MED ORDER — FENTANYL CITRATE (PF) 100 MCG/2ML IJ SOLN
INTRAMUSCULAR | Status: AC
  Administered 2020-07-10: 25 ug via INTRAVENOUS
  Filled 2020-07-10: qty 2

## 2020-07-10 MED ORDER — OXYCODONE-ACETAMINOPHEN 5-325 MG PO TABS
ORAL_TABLET | ORAL | Status: AC
  Filled 2020-07-10: qty 1

## 2020-07-10 MED ORDER — CHLORHEXIDINE GLUCONATE 0.12 % MT SOLN
15.0000 mL | Freq: Once | OROMUCOSAL | Status: AC
  Administered 2020-07-10: 15 mL via OROMUCOSAL

## 2020-07-10 MED ORDER — FENTANYL CITRATE (PF) 100 MCG/2ML IJ SOLN
INTRAMUSCULAR | Status: AC
  Filled 2020-07-10: qty 2

## 2020-07-10 MED ORDER — CEFAZOLIN SODIUM-DEXTROSE 2-4 GM/100ML-% IV SOLN
INTRAVENOUS | Status: AC
  Filled 2020-07-10: qty 100

## 2020-07-10 MED ORDER — SUGAMMADEX SODIUM 200 MG/2ML IV SOLN
INTRAVENOUS | Status: DC | PRN
  Administered 2020-07-10: 200 mg via INTRAVENOUS

## 2020-07-10 MED ORDER — FENTANYL CITRATE (PF) 100 MCG/2ML IJ SOLN
25.0000 ug | INTRAMUSCULAR | Status: DC | PRN
  Administered 2020-07-10 (×2): 25 ug via INTRAVENOUS

## 2020-07-10 MED ORDER — ONDANSETRON HCL 4 MG/2ML IJ SOLN
INTRAMUSCULAR | Status: DC | PRN
  Administered 2020-07-10: 4 mg via INTRAVENOUS

## 2020-07-10 MED ORDER — PHENYLEPHRINE HCL (PRESSORS) 10 MG/ML IV SOLN
INTRAVENOUS | Status: DC | PRN
  Administered 2020-07-10 (×5): 100 ug via INTRAVENOUS

## 2020-07-10 MED ORDER — ROCURONIUM BROMIDE 100 MG/10ML IV SOLN
INTRAVENOUS | Status: DC | PRN
  Administered 2020-07-10: 50 mg via INTRAVENOUS
  Administered 2020-07-10 (×2): 10 mg via INTRAVENOUS
  Administered 2020-07-10: 20 mg via INTRAVENOUS
  Administered 2020-07-10 (×2): 10 mg via INTRAVENOUS

## 2020-07-10 MED ORDER — ONDANSETRON HCL 4 MG/2ML IJ SOLN
INTRAMUSCULAR | Status: AC
  Filled 2020-07-10: qty 2

## 2020-07-10 MED ORDER — ONDANSETRON HCL 4 MG/2ML IJ SOLN
4.0000 mg | Freq: Once | INTRAMUSCULAR | Status: AC | PRN
  Administered 2020-07-10: 4 mg via INTRAVENOUS

## 2020-07-10 MED ORDER — LACTATED RINGERS IV SOLN: INTRAVENOUS | Status: DC

## 2020-07-10 MED ORDER — LIDOCAINE HCL (CARDIAC) PF 100 MG/5ML IV SOSY
PREFILLED_SYRINGE | INTRAVENOUS | Status: DC | PRN
  Administered 2020-07-10: 100 mg via INTRAVENOUS

## 2020-07-10 MED ORDER — ORAL CARE MOUTH RINSE: 15.0000 mL | Freq: Once | OROMUCOSAL | Status: AC

## 2020-07-10 MED ORDER — DEXAMETHASONE SODIUM PHOSPHATE 10 MG/ML IJ SOLN
INTRAMUSCULAR | Status: DC | PRN
  Administered 2020-07-10: 4 mg via INTRAVENOUS

## 2020-07-10 SURGICAL SUPPLY — 36 items
ADAPTER IRRIG TUBE 2 SPIKE SOL (ADAPTER) ×4 IMPLANT
ADPR TBG 2 SPK PMP STRL ASCP (ADAPTER) ×2
BAG DRN LRG CPC RND TRDRP CNTR (MISCELLANEOUS)
BAG DRN RND TRDRP ANRFLXCHMBR (UROLOGICAL SUPPLIES) ×1
BAG URINE DRAIN 2000ML AR STRL (UROLOGICAL SUPPLIES) ×2 IMPLANT
BAG URO DRAIN 4000ML (MISCELLANEOUS) IMPLANT
CATH FOL 2WAY LX 20X30 (CATHETERS) ×2 IMPLANT
CATH FOL 2WAY LX 22X30 (CATHETERS) IMPLANT
CATH FOLEY 3WAY 30CC 22FR (CATHETERS) IMPLANT
CATH URETL 5X70 OPEN END (CATHETERS) ×2 IMPLANT
CONTAINER COLLECT MORCELLATR (MISCELLANEOUS) ×1 IMPLANT
DRAPE 3/4 80X56 (DRAPES) ×2 IMPLANT
DRAPE UTILITY 15X26 TOWEL STRL (DRAPES) IMPLANT
FIBER LASER FLEXIVA PULSE 550 (Laser) ×2 IMPLANT
FILTER OVERFLOW MORCELLATOR (FILTER) ×1 IMPLANT
GLOVE SURG ENC MOIS LTX SZ6.5 (GLOVE) ×4 IMPLANT
GOWN STRL REUS W/ TWL LRG LVL3 (GOWN DISPOSABLE) ×2 IMPLANT
GOWN STRL REUS W/TWL LRG LVL3 (GOWN DISPOSABLE) ×4
HOLDER FOLEY CATH W/STRAP (MISCELLANEOUS) ×2 IMPLANT
KIT TURNOVER CYSTO (KITS) ×2 IMPLANT
MBRN O SEALING YLW 17 FOR INST (MISCELLANEOUS) ×2
MEMBRANE SLNG YLW 17 FOR INST (MISCELLANEOUS) ×1 IMPLANT
MORCELLATOR COLLECT CONTAINER (MISCELLANEOUS) ×2
MORCELLATOR OVERFLOW FILTER (FILTER) ×2
MORCELLATOR ROTATION 4.75 335 (MISCELLANEOUS) ×2 IMPLANT
PACK CYSTO AR (MISCELLANEOUS) ×2 IMPLANT
SET CYSTO W/LG BORE CLAMP LF (SET/KITS/TRAYS/PACK) IMPLANT
SET IRRIG Y TYPE TUR BLADDER L (SET/KITS/TRAYS/PACK) ×2 IMPLANT
SLEEVE PROTECTION STRL DISP (MISCELLANEOUS) ×4 IMPLANT
SOL .9 NS 3000ML IRR  AL (IV SOLUTION) ×25
SOL .9 NS 3000ML IRR AL (IV SOLUTION) ×25
SOL .9 NS 3000ML IRR UROMATIC (IV SOLUTION) ×25 IMPLANT
SYR TOOMEY IRRIG 70ML (MISCELLANEOUS) ×2
SYRINGE TOOMEY IRRIG 70ML (MISCELLANEOUS) ×1 IMPLANT
TUBE PUMP MORCELLATOR PIRANHA (TUBING) ×2 IMPLANT
WATER STERILE IRR 1000ML POUR (IV SOLUTION) ×2 IMPLANT

## 2020-07-10 NOTE — Anesthesia Procedure Notes (Signed)
Procedure Name: Intubation Date/Time: 07/10/2020 7:59 AM Performed by: Leander Rams, CRNA Pre-anesthesia Checklist: Patient identified, Emergency Drugs available, Suction available, Patient being monitored and Timeout performed Patient Re-evaluated:Patient Re-evaluated prior to induction Oxygen Delivery Method: Circle system utilized Preoxygenation: Pre-oxygenation with 100% oxygen Induction Type: IV induction Ventilation: Mask ventilation without difficulty Laryngoscope Size: McGraph and 3 Grade View: Grade I Tube type: Oral Tube size: 7.0 mm Number of attempts: 1 Airway Equipment and Method: Stylet

## 2020-07-10 NOTE — Anesthesia Preprocedure Evaluation (Signed)
Anesthesia Evaluation  Patient identified by MRN, date of birth, ID band Patient awake    Reviewed: Allergy & Precautions, NPO status , Patient's Chart, lab work & pertinent test results  Airway Mallampati: II       Dental   Pulmonary neg sleep apnea, neg COPD, Not current smoker,           Cardiovascular hypertension, Pt. on medications (-) Past MI and (-) CHF + dysrhythmias (complete heart block) + pacemaker      Neuro/Psych TIACVA (Numbness, right hand residual), Residual Symptoms    GI/Hepatic Neg liver ROS, hiatal hernia, GERD  ,  Endo/Other  neg diabetes  Renal/GU Renal disease (Renal CA)     Musculoskeletal   Abdominal   Peds  Hematology   Anesthesia Other Findings   Reproductive/Obstetrics                             Anesthesia Physical Anesthesia Plan  ASA: III  Anesthesia Plan: General   Post-op Pain Management:    Induction: Intravenous  PONV Risk Score and Plan: 2 and Ondansetron and Dexamethasone  Airway Management Planned: Oral ETT  Additional Equipment:   Intra-op Plan:   Post-operative Plan:   Informed Consent: I have reviewed the patients History and Physical, chart, labs and discussed the procedure including the risks, benefits and alternatives for the proposed anesthesia with the patient or authorized representative who has indicated his/her understanding and acceptance.       Plan Discussed with:   Anesthesia Plan Comments:         Anesthesia Quick Evaluation

## 2020-07-10 NOTE — Anesthesia Postprocedure Evaluation (Signed)
Anesthesia Post Note  Patient: Thomas [name]  Procedure(s) Performed: HOLEP-LASER ENUCLEATION OF THE PROSTATE WITH MORCELLATION (N/A Prostate)  Patient location during evaluation: PACU Anesthesia Type: General Level of consciousness: awake and alert and oriented Pain management: pain level controlled Vital Signs Assessment: post-procedure vital signs reviewed and stable Respiratory status: spontaneous breathing, nonlabored ventilation and respiratory function stable Cardiovascular status: blood pressure returned to baseline and stable Postop Assessment: no signs of nausea or vomiting Anesthetic complications: no   No complications documented.   Last Vitals:  Vitals:   07/10/20 1315 07/10/20 1330  BP: (!) 143/75 (!) 142/82  Pulse: 65 64  Resp: 14 12  Temp:    SpO2: 99% 98%    Last Pain:  Vitals:   07/10/20 1315  TempSrc:   PainSc: 7                  Ludella Pranger

## 2020-07-10 NOTE — Discharge Instructions (Addendum)
Holmium Laser Enucleation of the Prostate (HoLEP)  HoLEP is a treatment for men with benign prostatic hyperplasia (BPH). The laser surgery removed blockages of urine flow, and is done without any incisions on the body.     What is HoLEP?  HoLEP is a type of laser surgery used to treat obstruction (blockage) of urine flow as a result of benign prostatic hyperplasia (BPH). In men with BPH, the prostate gland is not cancerous, but has become enlarged. An enlarged prostate can result in a number of urinary tract symptoms such as weak urinary stream, difficulty in starting urination, inability to urinate, frequent urination, or getting up at night to urinate.  HoLEP was developed in the 1990's as a more effective and less expensive surgical option for BPH, compared to other surgical options such as laser vaporization(PVP/greenlight laser), transurethral resection of the prostate(TURP), and open simple prostatectomy.   What happens during a HoLEP?  HoLEP requires general anesthesia ("asleep" throughout the procedure).   An antibiotic is given to reduce the risk of infection  A surgical instrument called a resectoscope is inserted through the urethra (the tube that carries urine from the bladder). The resectoscope has a camera that allows the surgeon to view the internal structure of the prostate gland, and to see where the incisions are being made during surgery.  The laser is inserted into the resectoscope and is used to enucleate (free up) the enlarged prostate tissue from the capsule (outer shell) and then to seal up any blood vessels. The tissue that has been removed is pushed back into the bladder.  A morcellator is placed through the resectoscope, and is used to suction out the prostate tissue that has been pushed into the bladder.  When the prostate tissue has been removed, the resectoscope is removed, and a foley catheter is placed to allow healing and drain the urine from the  bladder.     What happens after a HoLEP?  More than 90% of patients go home the same day a few hours after surgery. Less than 10% will be admitted to the hospital overnight for observation to monitor the urine, or if they have other medical problems.  Fluid is flushed through the catheter for about 1 hour after surgery to clear any blood from the urine. It is normal to have some blood in the urine after surgery. The need for blood transfusion is extremely rare.  Eating and drinking are permitted after the procedure once the patient has fully awakened from anesthesia.  The catheter is usually removed 2-3 days after surgery- the patient will come to clinic to have the catheter removed and make sure they can urinate on their own.  It is very important to drink lots of fluids after surgery for one week to keep the bladder flushed.  At first, there may be some burning with urination, but this typically improved within a few hours to days. Most patients do not have a significant amount of pain, and narcotic pain medications are rarely needed.  Symptoms of urinary frequency, urgency, and even leakage are NORMAL for the first few weeks after surgery as the bladder adjusts after having to work hard against blockage from the prostate for many years. This will improve, but can sometimes take several months.  The use of pelvic floor exercises (Kegel exercises) can help improve problems with urinary incontinence.   After catheter removal, patients will be seen at 6 weeks and 6 months for symptom check  No heavy lifting for   at least 2-3 weeks after surgery, however patients can walk and do light activities the first day after surgery. Return to work time depends on occupation.    What are the advantages of HoLEP?  HoLEP has been studied in many different parts of the world and has been shown to be a safe and effective procedure. Although there are many types of BPH surgeries available, HoLEP offers a  unique advantage in being able to remove a large amount of tissue without any incisions on the body, even in very large prostates, while decreasing the risk of bleeding and providing tissue for pathology (to look for cancer). This decreases the need for blood transfusions during surgery, minimizes hospital stay, and reduces the risk of needing repeat treatment.  What are the side effects of HoLEP?  Temporary burning and bleeding during urination. Some blood may be seen in the urine for weeks after surgery and is part of the healing process.  Urinary incontinence (inability to control urine flow) is expected in all patients immediately after surgery and they should wear pads for the first few days/weeks. This typically improves over the course of several weeks. Performing Kegel exercises can help decrease leakage from stress maneuvers such as coughing, sneezing, or lifting. The rate of long term leakage is very low. Patients may also have leakage with urgency and this may be treated with medication. The risk of urge incontinence can be dependent on several factors including age, prostate size, symptoms, and other medical problems.  Retrograde ejaculation or "backwards ejaculation." In 75% of cases, the patient will not see any fluid during ejaculation after surgery.  Erectile function is generally not significantly affected.   What are the risks of HoLEP?  Injury to the urethra or development of scar tissue at a later date  Injury to the capsule of the prostate (typically treated with longer catheterization).  Injury to the bladder or ureteral orifices (where the urine from the kidney drains out)  Infection of the bladder, testes, or kidneys  Return of urinary obstruction at a later date requiring another operation (<2%)  Need for blood transfusion or re-operation due to bleeding  Failure to relieve all symptoms and/or need for prolonged catheterization after surgery  5-15% of patients are  found to have previously undiagnosed prostate cancer in their specimen. Prostate cancer can be treated after HoLEP.  Standard risks of anesthesia including blood clots, heart attacks, etc  When should I call my doctor?  Fever over 101.3 degrees  Inability to urinate, or large blood clots in the urine     Indwelling Urinary Catheter Care, Adult An indwelling urinary catheter is a thin tube that is put into your bladder. The tube helps to drain pee (urine) out of your body. The tube goes in through your urethra. Your urethra is where pee comes out of your body. Your pee will come out through the catheter, then it will go into a bag (drainage bag). Take good care of your catheter so it will work well. How to wear your catheter and bag Supplies needed  Sticky tape (adhesive tape) or a leg strap.  Alcohol wipe or soap and water (if you use tape).  A clean towel (if you use tape).  Large overnight bag.  Smaller bag (leg bag). Wearing your catheter Attach your catheter to your leg with tape or a leg strap.  Make sure the catheter is not pulled tight.  If a leg strap gets wet, take it off and put on  a dry strap.  If you use tape to hold the bag on your leg: 1. Use an alcohol wipe or soap and water to wash your skin where the tape made it sticky before. 2. Use a clean towel to pat-dry that skin. 3. Use new tape to make the bag stay on your leg. Wearing your bags You should have been given a large overnight bag.  You may wear the overnight bag in the day or night.  Always have the overnight bag lower than your bladder.  Do not let the bag touch the floor.  Before you go to sleep, put a clean plastic bag in a wastebasket. Then hang the overnight bag inside the wastebasket. You should also have a smaller leg bag that fits under your clothes.  Always wear the leg bag below your knee.  Do not wear your leg bag at night. How to care for your skin and catheter Supplies needed  A  clean washcloth.  Water and mild soap.  A clean towel. Caring for your skin and catheter  Clean the skin around your catheter every day: 1. Wash your hands with soap and water. 2. Wet a clean washcloth in warm water and mild soap. 3. Clean the skin around your urethra.  If you are male:  Gently spread the folds of skin around your vagina (labia).  With the washcloth in your other hand, wipe the inner side of your labia on each side. Wipe from front to back.  If you are male:  Pull back any skin that covers the end of your penis (foreskin).  With the washcloth in your other hand, wipe your penis in small circles. Start wiping at the tip of your penis, then move away from the catheter.  Move the foreskin back in place, if needed. 4. With your free hand, hold the catheter close to where it goes into your body.  Keep holding the catheter during cleaning so it does not get pulled out. 5. With the washcloth in your other hand, clean the catheter.  Only wipe downward on the catheter.  Do not wipe upward toward your body. Doing this may push germs into your urethra and cause infection. 6. Use a clean towel to pat-dry the catheter and the skin around it. Make sure to wipe off all soap. 7. Wash your hands with soap and water.  Shower every day. Do not take baths.  Do not use cream, ointment, or lotion on the area where the catheter goes into your body, unless your doctor tells you to.  Do not use powders, sprays, or lotions on your genital area.  Check your skin around the catheter every day for signs of infection. Check for: ? Redness, swelling, or pain. ? Fluid or blood. ? Warmth. ? Pus or a bad smell.      How to empty the bag Supplies needed  Rubbing alcohol.  Gauze pad or cotton ball.  Tape or a leg strap. Emptying the bag Pour the pee out of your bag when it is ?- full, or at least 2-3 times a day. Do this for your overnight bag and your leg bag. 1. Wash your  hands with soap and water. 2. Separate (detach) the bag from your leg. 3. Hold the bag over the toilet or a clean pail. Keep the bag lower than your hips and bladder. This is so the pee (urine) does not go back into the tube. 4. Open the pour spout. It is at  the bottom of the bag. 5. Empty the pee into the toilet or pail. Do not let the pour spout touch any surface. 6. Put rubbing alcohol on a gauze pad or cotton ball. 7. Use the gauze pad or cotton ball to clean the pour spout. 8. Close the pour spout. 9. Attach the bag to your leg with tape or a leg strap. 10. Wash your hands with soap and water. Follow instructions for cleaning the drainage bag:  From the product maker.  As told by your doctor. How to change the bag Supplies needed  Alcohol wipes.  A clean bag.  Tape or a leg strap. Changing the bag Replace your bag when it starts to leak, smell bad, or look dirty. 1. Wash your hands with soap and water. 2. Separate the dirty bag from your leg. 3. Pinch the catheter with your fingers so that pee does not spill out. 4. Separate the catheter tube from the bag tube where these tubes connect (at the connection valve). Do not let the tubes touch any surface. 5. Clean the end of the catheter tube with an alcohol wipe. Use a different alcohol wipe to clean the end of the bag tube. 6. Connect the catheter tube to the tube of the clean bag. 7. Attach the clean bag to your leg with tape or a leg strap. Do not make the bag tight on your leg. 8. Wash your hands with soap and water. General rules  Never pull on your catheter. Never try to take it out. Doing that can hurt you.  Always wash your hands before and after you touch your catheter or bag. Use a mild, fragrance-free soap. If you do not have soap and water, use hand sanitizer.  Always make sure there are no twists or bends (kinks) in the catheter tube.  Always make sure there are no leaks in the catheter or bag.  Drink enough  fluid to keep your pee pale yellow.  Do not take baths, swim, or use a hot tub.  If you are male, wipe from front to back after you poop (have a bowel movement).   Contact a doctor if:  Your pee is cloudy.  Your pee smells worse than usual.  Your catheter gets clogged.  Your catheter leaks.  Your bladder feels full. Get help right away if:  You have redness, swelling, or pain where the catheter goes into your body.  You have fluid, blood, pus, or a bad smell coming from the area where the catheter goes into your body.  Your skin feels warm where the catheter goes into your body.  You have a fever.  You have pain in your: ? Belly (abdomen). ? Legs. ? Lower back. ? Bladder.  You see blood in the catheter.  Your pee is pink or red.  You feel sick to your stomach (nauseous).  You throw up (vomit).  You have chills.  Your pee is not draining into the bag.  Your catheter gets pulled out. Summary  An indwelling urinary catheter is a thin tube that is placed into the bladder to help drain pee (urine) out of the body.  The catheter is placed into the part of the body that drains pee from the bladder (urethra).  Taking good care of your catheter will keep it working properly and help prevent problems.  Always wash your hands before and after touching your catheter or bag.  Never pull on your catheter or try to take it  out. This information is not intended to replace advice given to you by your health care provider. Make sure you discuss any questions you have with your health care provider. Document Revised: 08/14/2018 Document Reviewed: 12/06/2016 Elsevier Patient Education  2021 [employer]   1) The drugs that you were given will stay in your system until tomorrow so for the next 24 hours you should not:  A) Drive an automobile B) Make any legal decisions C) Drink any alcoholic beverage   2) You may  resume regular meals tomorrow.  Today it is better to start with liquids and gradually work up to solid foods.  You may eat anything you prefer, but it is better to start with liquids, then soup and crackers, and gradually work up to solid foods.   3) Please notify your doctor immediately if you have any unusual bleeding, trouble breathing, redness and pain at the surgery site, drainage, fever, or pain not relieved by medication.    4) Additional Instructions:        Please contact your physician with any problems or Same Day Surgery at [phone], Monday through Friday 6 am to 4 pm, or [place] at [facility] number at [phone].

## 2020-07-10 NOTE — Interval H&P Note (Signed)
History and Physical Interval Note:  07/10/2020 7:25 AM  Thomas [name]  has presented today for surgery, with the diagnosis of benign prostatic hypertrophy with urinary retention.  The various methods of treatment have been discussed with the patient and family. After consideration of risks, benefits and other options for treatment, the patient has consented to  Procedure(s): [place] WITH MORCELLATION (N/A) as a surgical intervention.  The patient's history has been reviewed, patient examined, no change in status, stable for surgery.  I have reviewed the patient's chart and labs.  Questions were answered to the patient's satisfaction.    RRR CTAB  Hollice Espy

## 2020-07-10 NOTE — Transfer of Care (Signed)
Immediate Anesthesia Transfer of Care Note  Patient: Thomas [name]  Procedure(s) Performed: HOLEP-LASER ENUCLEATION OF THE PROSTATE WITH MORCELLATION (N/A Prostate)  Patient Location: PACU  Anesthesia Type:General  Level of Consciousness: awake  Airway & Oxygen Therapy: Patient Spontanous Breathing  Post-op Assessment: Report given to RN  Post vital signs: stable  Last Vitals:  Vitals Value Taken Time  BP 136/67 07/10/20 1233  Temp    Pulse 63 07/10/20 1235  Resp 12 07/10/20 1235  SpO2 98 % 07/10/20 1235  Vitals shown include unvalidated device data.  Last Pain:  Vitals:   07/10/20 0626  TempSrc: Oral  PainSc: 0-No pain         Complications: No complications documented.

## 2020-07-10 NOTE — Op Note (Signed)
Date of procedure: 07/10/20  Preoperative diagnosis:  1. BPH with BOO  Postoperative diagnosis:  1. same   Procedure: 1. HoLEP with morcellation  Surgeon: Hollice Espy, MD  Anesthesia: General  Complications: None  Intraoperative findings: Massively enlarged prostate with trabeculated bladder  EBL: 200 cc  Specimens: Prostate chips  Drains: 20 French two-way Foley catheter  Indication: Thomas [name] is a 87 y.o. patient with personal history of massive prostamegaly, greater than 300 cc on CT scan and refractory urinary retention.  After reviewing the management options for treatment, he elected to proceed with the above surgical procedure(s). We have discussed the potential benefits and risks of the procedure, side effects of the proposed treatment, the likelihood of the patient achieving the goals of the procedure, and any potential problems that might occur during the procedure or recuperation. Informed consent has been obtained.  Description of procedure:  The patient was taken to the operating room and general anesthesia was induced.  The patient was placed in the dorsal lithotomy position, prepped and draped in the usual sterile fashion, and preoperative antibiotics were administered. A preoperative time-out was performed.     A 26 French resectoscope sheath using a blunt angled obturator was introduced without difficulty into the bladder.  The bladder was carefully inspected and noted to be moderately trabeculated.  There is an elevated bladder neck with a very small intravesical component but an elevated bladder neck.  The trigone was able to be visualized with some manipulation and the UOs were good distance from bladder neck itself.  The prostatic fossa had significant trilobar coaptation with greater than 5 cm prostatic length.  A 550 m laser fiber was then brought in and using settings of 1 J's and 50 Hz, 2 incisions were created at the 5:00 and 7:00 positions of the  bladder neck on either side of the median lobe down to the level of the bladder neck/capsular fibers.  The incision was carried down caudally meeting in the midline just above the verumontanum.  The median lobe was then enucleated from a caudal to cranial direction cleaving the adenoma off the underlying capsule rolling it towards the bladder neck and ultimately cleaving the mucosa to free the median lobe into the bladder.   Next, a semilunar incision was created at the prostatic apex on the left side again freeing up the adenoma from the underlying capsule.  Care was taken to avoid any resection past the verumontanum.  This incision was carried around laterally and cranially towards the bladder neck.  Ultimately, I was able to complete the anterior commissure mucosa and the adenoma into the bladder creating a widely patent prostatic fossa.     Next, the same similar incision was created at the right prostatic apex.    On both sides, left some additional apical tissue to help with continence. Once this was completed and cleared from the bladder neck, the prostatic fossa was noted to be widely patent.  Hemostasis was achieved using hemostatic fiber settings.  Bilateral UOs were visualized and free of any injury.  Finally, the 29 French resectoscope was exchanged for nephroscope and using the Piranha handpiece morcellator, the bladder was distended in each of the prostate chips were evacuated.  Given the overall size of the prostate, and occupied a large portion of the bladder and morcellation was somewhat time-consuming, greater than an hour. The bladder was irrigated several times to ensure that all chips are clear from the bladder.  There were small mucosal  abrasions on the posterior and right lateral bladder wall which appeared to be superficial without any obvious bladder perforation appreciated.  The bladder filled and emptied normally and irrigated well.   Hemostasis was adequate.  10 mg of IV Lasix was  administered to help with postoperative diuresis.  A 20 French two-way Foley catheter was then inserted over a catheter guide with 60 cc in the balloon.  The catheter irrigated easily and well.  Patient was then clean and dry, repositioned supine position, reversed from anesthesia, taken to PACU in stable condition.   Plan: Patient will return to the office in 2 weeks for voiding trial.  He will use Myrbetriq as needed and has pain meds at home.  I will see him in 6 weeks postop.    Hollice Espy, M.D.

## 2020-07-11 ENCOUNTER — Encounter: Payer: Self-pay | Admitting: Urology

## 2020-07-11 LAB — SURGICAL PATHOLOGY

## 2020-07-12 ENCOUNTER — Telehealth: Payer: Self-pay | Admitting: Radiology

## 2020-07-12 NOTE — Telephone Encounter (Signed)
Made patient aware of follow up appointments. Patient reports he is feeling well post op with decresed bleeding and clear urine. Reports low grade fever with temperatures ranging from 98.9 to 99.2. Would like a call back regarding increased temperatures at [phone].

## 2020-07-12 NOTE — Telephone Encounter (Signed)
Patient called back stating he still has a low temp of 99. He denies chills, nausea or vomiting. Dr. Erlene Quan notified and call transferred to her

## 2020-07-14 DIAGNOSIS — R338 Other retention of urine: Secondary | ICD-10-CM | POA: Diagnosis not present

## 2020-07-14 DIAGNOSIS — G8929 Other chronic pain: Secondary | ICD-10-CM | POA: Diagnosis not present

## 2020-07-14 DIAGNOSIS — S32022D Unstable burst fracture of second lumbar vertebra, subsequent encounter for fracture with routine healing: Secondary | ICD-10-CM | POA: Diagnosis not present

## 2020-07-14 DIAGNOSIS — S161XXD Strain of muscle, fascia and tendon at neck level, subsequent encounter: Secondary | ICD-10-CM | POA: Diagnosis not present

## 2020-07-17 ENCOUNTER — Other Ambulatory Visit: Payer: Self-pay | Admitting: Internal Medicine

## 2020-07-19 ENCOUNTER — Other Ambulatory Visit: Payer: Self-pay

## 2020-07-19 ENCOUNTER — Encounter: Payer: Self-pay | Admitting: Family Medicine

## 2020-07-19 ENCOUNTER — Ambulatory Visit (INDEPENDENT_AMBULATORY_CARE_PROVIDER_SITE_OTHER): Payer: Medicare Other | Admitting: Family Medicine

## 2020-07-19 VITALS — BP 107/76 | HR 80 | Temp 97.8°F | Resp 16 | Wt 150.0 lb

## 2020-07-19 DIAGNOSIS — I2510 Atherosclerotic heart disease of native coronary artery without angina pectoris: Secondary | ICD-10-CM

## 2020-07-19 DIAGNOSIS — S22001A Stable burst fracture of unspecified thoracic vertebra, initial encounter for closed fracture: Secondary | ICD-10-CM

## 2020-07-19 DIAGNOSIS — N401 Enlarged prostate with lower urinary tract symptoms: Secondary | ICD-10-CM

## 2020-07-19 DIAGNOSIS — I4819 Other persistent atrial fibrillation: Secondary | ICD-10-CM

## 2020-07-19 DIAGNOSIS — E860 Dehydration: Secondary | ICD-10-CM

## 2020-07-19 DIAGNOSIS — R350 Frequency of micturition: Secondary | ICD-10-CM

## 2020-07-19 LAB — POCT URINALYSIS DIPSTICK
Bilirubin, UA: NEGATIVE
Glucose, UA: NEGATIVE
Ketones, UA: NEGATIVE
Nitrite, UA: NEGATIVE
Protein, UA: POSITIVE — AB
Spec Grav, UA: 1.02 (ref 1.010–1.025)
Urobilinogen, UA: 0.2 E.U./dL
pH, UA: 6 (ref 5.0–8.0)

## 2020-07-19 NOTE — Progress Notes (Signed)
I,[name],acting as a scribe for [name], MD.,have documented all relevant documentation on the behalf of [name], MD,as directed by  [name], MD while in the presence of [name], MD.   Established patient visit   Patient: Thomas [name]   DOB: 04-20-1933   87 y.o. Male  MRN: [id number] Visit Date: 07/19/2020  Today's healthcare provider: [name], MD   Chief Complaint  Patient presents with  . Urinary Frequency   Subjective    Urinary Frequency  This is a new problem. Associated symptoms include frequency.  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       Medications: Outpatient Medications Prior to Visit  Medication Sig  . amLODipine (NORVASC) 5 MG tablet TAKE 1 TABLET BY MOUTH DAILY (Patient taking differently: Take by mouth daily as needed.)  . apixaban (ELIQUIS) 5 MG TABS tablet Take 1 tablet (5 mg total) by mouth 2 (two) times daily. (Patient taking differently: Take 2.5 mg by mouth 2 (two) times daily.)  . calcium carbonate (TUMS - DOSED IN MG ELEMENTAL CALCIUM) 500 MG chewable tablet Chew 1-2 tablets by mouth daily as needed for indigestion.  . Cholecalciferol (VITAMIN D3) 5000 units CAPS Take 5,000 Units by mouth daily.  . Coenzyme Q10 (COQ10) 100 MG CAPS Take 100 mg by mouth daily.  . Cyanocobalamin (VITAMIN B-12 PO) Take 3,000 mcg by mouth daily with breakfast.  . dofetilide (TIKOSYN) 125 MCG capsule Take 1 capsule (125 mcg total) by mouth 2 (two) times daily.  Marland Kitchen dutasteride (AVODART) 0.5 MG capsule Take 0.5 mg by mouth daily.  . febuxostat (ULORIC) 40 MG tablet Take 40 mg by mouth daily.  Marland Kitchen gabapentin (NEURONTIN) 300 MG capsule Take by mouth.  . Magnesium 500 MG TABS Take 500 mg by mouth at bedtime.  . methocarbamol (ROBAXIN) 750 MG tablet Take 1 tablet (750 mg total) by mouth every 8 (eight) hours as needed for muscle spasms. (Patient taking  differently: Take 750 mg by mouth at bedtime.)  . metoprolol succinate (TOPROL-XL) 50 MG 24 hr tablet TAKE 1 TABLET BY MOUTH DAILY WITH OR IMMEDIATELY FOLLOWING A MEAL (Patient taking differently: Take 50 mg by mouth at bedtime.)  . metroNIDAZOLE (FLAGYL) 500 MG tablet TAKE 1 TABLET BY MOUTH TWICE DAILY ON SUNDAY AND MONDAY (Patient taking differently: Take 500 mg by mouth See admin instructions. Take 1 tablet (500 mg) by mouth twice daily on Saturdays & Sundays ONLY)  . Multiple Vitamins-Minerals (PRESERVISION/LUTEIN PO) Take 1 capsule by mouth in the morning.  Marland Kitchen NEEDLE, DISP, 18 G (BD DISP NEEDLES) 18G X 1-1/2" MISC 1 mg by Does not apply route every 14 (fourteen) days.  Marland Kitchen NEEDLE, DISP, 21 G (BD DISP NEEDLES) 21G X 1-1/2" MISC 1 mg by Does not apply route every 14 (fourteen) days.  Marland Kitchen oxyCODONE (OXY IR/ROXICODONE) 5 MG immediate release tablet Take 1 tablet (5 mg total) by mouth every 6 (six) hours as needed for moderate pain or breakthrough pain. (Patient taking differently: Take 5 mg by mouth in the morning and at bedtime.)  . oxyCODONE-acetaminophen (PERCOCET) 7.5-325 MG tablet Take 1-2 tablets by mouth every 4 (four) hours as needed for severe pain. (Patient taking differently: Take 1-2 tablets by mouth in the morning, at noon, and at bedtime.)  . polyethylene glycol (MIRALAX / GLYCOLAX) 17 g packet Take 17 g by mouth daily.  Marland Kitchen  propranolol (INDERAL) 40 MG tablet TAKE ONE (1) TABLET THREE (3) TIMES EACHDAY AS NEEDED (Patient taking differently: Take 40 mg by mouth 3 (three) times daily as needed (for chest tightness or a-fib flares).)  . senna (SENOKOT) 8.6 MG tablet Take 1 tablet by mouth 2 (two) times daily.  . Syringe, Disposable, (2-3CC SYRINGE) 3 ML MISC 1 mg by Does not apply route every 14 (fourteen) days.  . tamsulosin (FLOMAX) 0.4 MG CAPS capsule Take 1 capsule (0.4 mg total) by mouth daily after breakfast.  . testosterone cypionate (DEPOTESTOSTERONE CYPIONATE) 200 MG/ML injection Inject 1  mL (200 mg total) into the muscle every 28 (twenty-eight) days.  . traMADol HCl 100 MG TABS Take by mouth.  . Turmeric 500 MG CAPS Take 500 mg by mouth daily.  . [DISCONTINUED] gabapentin (NEURONTIN) 100 MG capsule Take 300 mg by mouth in the morning and at bedtime.   No facility-administered medications prior to visit.    Review of Systems  Genitourinary: Positive for frequency.        Objective    BP 107/76 (BP Location: Left Arm, Patient Position: Sitting, Cuff Size: Normal)   Pulse 80   Temp 97.8 F (36.6 C) (Oral)   Resp 16   Wt 150 lb (68 kg)   SpO2 97%   BMI 23.49 kg/m  BP Readings from Last 3 Encounters:  07/19/20 107/76  07/10/20 128/75  06/23/20 138/68   Wt Readings from Last 3 Encounters:  07/19/20 150 lb (68 kg)  07/04/20 150 lb (68 kg)  06/23/20 150 lb (68 kg)       Physical Exam Vitals reviewed.  Constitutional:      Appearance: He is well-developed.  HENT:     Head: Normocephalic and atraumatic.     Right Ear: External ear normal.     Left Ear: External ear normal.     Nose: Nose normal.  Eyes:     Conjunctiva/sclera: Conjunctivae normal.  Neck:     Thyroid: No thyromegaly.  Cardiovascular:     Rate and Rhythm: Normal rate and regular rhythm.     Heart sounds: Normal heart sounds.  Pulmonary:     Effort: Pulmonary effort is normal.     Breath sounds: Normal breath sounds.  Abdominal:     Palpations: Abdomen is soft.  Skin:    General: Skin is warm and dry.  Neurological:     General: No focal deficit present.     Mental Status: He is alert and oriented to person, place, and time.  Psychiatric:        Behavior: Behavior normal.        Thought Content: Thought content normal.        Judgment: Judgment normal.       Results for orders placed or performed in visit on 07/19/20  POCT urinalysis dipstick  Result Value Ref Range   Color, UA Amber    Clarity, UA Cloudy    Glucose, UA Negative Negative   Bilirubin, UA Negative     Ketones, UA Negative    Spec Grav, UA 1.020 1.010 - 1.025   Blood, UA Large    pH, UA 6.0 5.0 - 8.0   Protein, UA Positive (A) Negative   Urobilinogen, UA 0.2 0.2 or 1.0 E.U./dL   Nitrite, UA Negative    Leukocytes, UA Moderate (2+) (A) Negative    Assessment & Plan     1. Dehydration Patient feels dehydrated and has polydipsia.  Do work-up for  diabetes insipidus but I do not think he has this. - CBC w/Diff/Platelet - Comprehensive Metabolic Panel (CMET)  2. Frequency of urination Patient has had recent laser surgery for extremely large prostate.  Follow-up next week - CULTURE, URINE COMPREHENSIVE - POCT urinalysis dipstick  3. Persistent atrial fibrillation ([place])   4. Coronary artery disease involving native coronary artery of native heart without angina pectoris All risk factors treated 5. Benign prostatic hyperplasia with lower urinary tract symptoms, symptom details unspecified Recent surgery for this.  Foley catheter in place.  6. Closed burst fracture of thoracic vertebra, initial encounter [facility]) Status post surgery for this in January.   Return in about 13 days (around 08/01/2020).      I, [name], MD, have reviewed all documentation for this visit. The documentation on 07/21/20 for the exam, diagnosis, procedures, and orders are all accurate and complete.    Sircharles Holzheimer Cranford Mon, MD  [facility] [phone] (phone) [phone] (fax)  [place]

## 2020-07-20 DIAGNOSIS — S32029G Unspecified fracture of second lumbar vertebra, subsequent encounter for fracture with delayed healing: Secondary | ICD-10-CM | POA: Diagnosis not present

## 2020-07-20 DIAGNOSIS — Z981 Arthrodesis status: Secondary | ICD-10-CM | POA: Diagnosis not present

## 2020-07-20 DIAGNOSIS — I10 Essential (primary) hypertension: Secondary | ICD-10-CM | POA: Diagnosis not present

## 2020-07-20 LAB — COMPREHENSIVE METABOLIC PANEL
ALT: 7 IU/L (ref 0–44)
AST: 10 IU/L (ref 0–40)
Albumin/Globulin Ratio: 1.2 (ref 1.2–2.2)
Albumin: 3.7 g/dL (ref 3.6–4.6)
Alkaline Phosphatase: 136 IU/L — ABNORMAL HIGH (ref 44–121)
BUN/Creatinine Ratio: 17 (ref 10–24)
BUN: 23 mg/dL (ref 8–27)
Bilirubin Total: 0.3 mg/dL (ref 0.0–1.2)
CO2: 25 mmol/L (ref 20–29)
Calcium: 9.5 mg/dL (ref 8.6–10.2)
Chloride: 95 mmol/L — ABNORMAL LOW (ref 96–106)
Creatinine, Ser: 1.34 mg/dL — ABNORMAL HIGH (ref 0.76–1.27)
Globulin, Total: 3.1 g/dL (ref 1.5–4.5)
Glucose: 131 mg/dL — ABNORMAL HIGH (ref 65–99)
Potassium: 4.9 mmol/L (ref 3.5–5.2)
Sodium: 133 mmol/L — ABNORMAL LOW (ref 134–144)
Total Protein: 6.8 g/dL (ref 6.0–8.5)
eGFR: 52 mL/min/{1.73_m2} — ABNORMAL LOW (ref >59)

## 2020-07-20 LAB — CBC WITH DIFFERENTIAL/PLATELET
Basophils Absolute: 0 10*3/uL (ref 0.0–0.2)
Basos: 1 %
EOS (ABSOLUTE): 0.3 10*3/uL (ref 0.0–0.4)
Eos: 4 %
Hematocrit: 39.7 % (ref 37.5–51.0)
Hemoglobin: 12.9 g/dL — ABNORMAL LOW (ref 13.0–17.7)
Immature Grans (Abs): 0.1 10*3/uL (ref 0.0–0.1)
Immature Granulocytes: 1 %
Lymphocytes Absolute: 2.6 10*3/uL (ref 0.7–3.1)
Lymphs: 30 %
MCH: 28.7 pg (ref 26.6–33.0)
MCHC: 32.5 g/dL (ref 31.5–35.7)
MCV: 88 fL (ref 79–97)
Monocytes Absolute: 1.2 10*3/uL — ABNORMAL HIGH (ref 0.1–0.9)
Monocytes: 14 %
Neutrophils Absolute: 4.5 10*3/uL (ref 1.4–7.0)
Neutrophils: 50 %
Platelets: 293 10*3/uL (ref 150–450)
RBC: 4.49 x10E6/uL (ref 4.14–5.80)
RDW: 13.3 % (ref 11.6–15.4)
WBC: 8.8 10*3/uL (ref 3.4–10.8)

## 2020-07-24 ENCOUNTER — Other Ambulatory Visit: Payer: Self-pay

## 2020-07-24 ENCOUNTER — Ambulatory Visit: Payer: Medicare Other | Admitting: Physician Assistant

## 2020-07-24 ENCOUNTER — Ambulatory Visit (INDEPENDENT_AMBULATORY_CARE_PROVIDER_SITE_OTHER): Payer: Medicare Other | Admitting: Physician Assistant

## 2020-07-24 DIAGNOSIS — R338 Other retention of urine: Secondary | ICD-10-CM

## 2020-07-24 DIAGNOSIS — N401 Enlarged prostate with lower urinary tract symptoms: Secondary | ICD-10-CM

## 2020-07-24 LAB — CULTURE, URINE COMPREHENSIVE

## 2020-07-24 LAB — BLADDER SCAN AMB NON-IMAGING

## 2020-07-24 NOTE — Progress Notes (Signed)
Afternoon follow-up  Patient returned to clinic this afternoon for repeat PVR. He has been able to urinate. He has had urinary leakage. PVR 46mL.  Results for orders placed or performed in visit on 07/24/20  BLADDER SCAN AMB NON-IMAGING  Result Value Ref Range   Scan Result 31mL    Voiding trial passed. Counseled patient on normal postoperative findings including dysuria, gross hematuria, and urinary leakage. Counseled him to start Kegel exercises 3x10 sets daily to increase urinary control. Shared negative pathology results with patient; he expressed understanding. Also provided condom cath samples and Weisner clamp info to patient per his request.  Follow up: 6 week postop f/u with Dr. Erlene Quan

## 2020-07-24 NOTE — Patient Instructions (Addendum)
Congratulations on your recent HOLEP procedure! As discussed in clinic today, there are three main side effects that commonly occur after surgery: 1. Burning or pain with urination: This typically resolves within 1 week of surgery. If you are still having significant pain with urination 10 days after surgery, please call our clinic. We may need to check you for a urinary tract infection at that point, though this is rare. 2. Blood in the urine: This may come and go, but typically resolves completely within 3 weeks of surgery. If you are on blood thinners, it may take longer for the bleeding to resolve. As long as your urine remains thin and runny and you are not passing large clots (around the size of your palm), this is a normal postoperative finding. If you start to pass dark red urine or thick, ketchup-like urine, please call our office immediately. 3. Urinary leakage or urgency: This tends to improve with time, with most patients becoming dry within around 3 months of surgery. You may wear absorbant underwear or liners for security during this time. To help you get dry faster, please make sure you are completing your Kegel exercises as instructed, with a set of 10 exercises completed up to three times daily.   Kegel Exercises  Kegel exercises can help strengthen your pelvic floor muscles. The pelvic floor is a group of muscles that support your rectum, small intestine, and bladder. In females, pelvic floor muscles also help support the womb (uterus). These muscles help you control the flow of urine and stool. Kegel exercises are painless and simple, and they do not require any equipment. Your provider may suggest Kegel exercises to:  Improve bladder and bowel control.  Improve sexual response.  Improve weak pelvic floor muscles after surgery to remove the uterus (hysterectomy) or pregnancy (females).  Improve weak pelvic floor muscles after prostate gland removal or surgery (males). Kegel  exercises involve squeezing your pelvic floor muscles, which are the same muscles you squeeze when you try to stop the flow of urine or keep from passing gas. The exercises can be done while sitting, standing, or lying down, but it is best to vary your position. Exercises How to do Kegel exercises: 1. Squeeze your pelvic floor muscles tight. You should feel a tight lift in your rectal area. If you are a male, you should also feel a tightness in your vaginal area. Keep your stomach, buttocks, and legs relaxed. 2. Hold the muscles tight for up to 10 seconds. 3. Breathe normally. 4. Relax your muscles. 5. Repeat as told by your health care provider. Repeat this exercise daily as told by your health care provider. Continue to do this exercise for at least 4-6 weeks, or for as long as told by your health care provider. You may be referred to a physical therapist who can help you learn more about how to do Kegel exercises. Depending on your condition, your health care provider may recommend:  Varying how long you squeeze your muscles.  Doing several sets of exercises every day.  Doing exercises for several weeks.  Making Kegel exercises a part of your regular exercise routine. This information is not intended to replace advice given to you by your health care provider. Make sure you discuss any questions you have with your health care provider. Document Revised: 08/27/2019 Document Reviewed: 12/10/2017 Elsevier Patient Education  2021 [place].     [email or web address]  The Wiesner Incontinence Clamp is an external medical device used to control urine  leakage by compressing the urethra and preventing the flow of urine   Lets you maintain your active lifestyle Cost effective, saving you lots of money on adult incontinence briefs Can be worn during any activity Ergonomic design promotes confidence and provides all-day comfort    Step 3 Latch the incontinence clamp to  compress the  urethra at the level that's comfortable to you         Step 2 Place your penis between the silicone rubber pads with the  incontinence clamp about halfway down the shaft of your penis Step 1 Open the incontinence clamp by  releasing the catch and lifting up the top part

## 2020-07-24 NOTE — Progress Notes (Signed)
Catheter Removal  Patient is present today for a catheter removal.  51ml of water was drained from the balloon. A 20FR foley cath was removed from the bladder no complications were noted . Patient tolerated well.  Performed by: Debroah Loop, PA-C   Follow up/ Additional notes: Push fluids and RTC this afternoon for PVR.

## 2020-08-01 ENCOUNTER — Ambulatory Visit (INDEPENDENT_AMBULATORY_CARE_PROVIDER_SITE_OTHER): Payer: Medicare Other

## 2020-08-01 ENCOUNTER — Encounter: Payer: Self-pay | Admitting: Family Medicine

## 2020-08-01 ENCOUNTER — Ambulatory Visit: Payer: Self-pay | Admitting: Family Medicine

## 2020-08-01 DIAGNOSIS — I442 Atrioventricular block, complete: Secondary | ICD-10-CM | POA: Diagnosis not present

## 2020-08-01 LAB — CUP PACEART REMOTE DEVICE CHECK
Battery Remaining Longevity: 74 mo
Battery Voltage: 2.97 V
Brady Statistic AP VP Percent: 72.93 %
Brady Statistic AP VS Percent: 0 %
Brady Statistic AS VP Percent: 27.05 %
Brady Statistic AS VS Percent: 0.01 %
Brady Statistic RA Percent Paced: 66.28 %
Brady Statistic RV Percent Paced: 99.72 %
Date Time Interrogation Session: 20220329020433
Implantable Lead Implant Date: 20180214
Implantable Lead Implant Date: 20180214
Implantable Lead Location: 753859
Implantable Lead Location: 753860
Implantable Lead Model: 5076
Implantable Lead Model: 5076
Implantable Pulse Generator Implant Date: 20180214
Lead Channel Impedance Value: 304 Ohm
Lead Channel Impedance Value: 323 Ohm
Lead Channel Impedance Value: 380 Ohm
Lead Channel Impedance Value: 399 Ohm
Lead Channel Pacing Threshold Amplitude: 0.375 V
Lead Channel Pacing Threshold Amplitude: 0.75 V
Lead Channel Pacing Threshold Pulse Width: 0.4 ms
Lead Channel Pacing Threshold Pulse Width: 0.4 ms
Lead Channel Sensing Intrinsic Amplitude: 3.375 mV
Lead Channel Sensing Intrinsic Amplitude: 3.375 mV
Lead Channel Sensing Intrinsic Amplitude: 7.25 mV
Lead Channel Sensing Intrinsic Amplitude: 7.25 mV
Lead Channel Setting Pacing Amplitude: 2 V
Lead Channel Setting Pacing Amplitude: 2.5 V
Lead Channel Setting Pacing Pulse Width: 0.4 ms
Lead Channel Setting Sensing Sensitivity: 2 mV

## 2020-08-01 NOTE — Progress Notes (Deleted)
Established patient visit   Patient: Thomas [name]   DOB: 08-30-1932   87 y.o. Male  MRN: [id number] Visit Date: 08/01/2020  Today's healthcare provider: [name], MD   No chief complaint on file.  Subjective    HPI  Dehydration From 07/19/2020-Patient feels dehydrated and has polydipsia. Do work-up for diabetes insipidus but I do not think he has this.  Frequency of urination From 07/19/2020-Patient has had recent laser surgery for extremely large prostate.  Follow-up next week  Benign prostatic hyperplasia with lower urinary tract symptoms, symptom details unspecified From 07/19/2020-Recent surgery for this.  Foley catheter in place.  Closed burst fracture of thoracic vertebra, initial encounter ([place]) From 07/19/2020-Status post surgery for this in January.   {Show patient history (optional):23778::" "}   Medications: Outpatient Medications Prior to Visit  Medication Sig  . amLODipine (NORVASC) 5 MG tablet TAKE 1 TABLET BY MOUTH DAILY (Patient taking differently: Take by mouth daily as needed.)  . apixaban (ELIQUIS) 5 MG TABS tablet Take 1 tablet (5 mg total) by mouth 2 (two) times daily. (Patient taking differently: Take 2.5 mg by mouth 2 (two) times daily.)  . calcium carbonate (TUMS - DOSED IN MG ELEMENTAL CALCIUM) 500 MG chewable tablet Chew 1-2 tablets by mouth daily as needed for indigestion.  . Cholecalciferol (VITAMIN D3) 5000 units CAPS Take 5,000 Units by mouth daily.  . Coenzyme Q10 (COQ10) 100 MG CAPS Take 100 mg by mouth daily.  . Cyanocobalamin (VITAMIN B-12 PO) Take 3,000 mcg by mouth daily with breakfast.  . dofetilide (TIKOSYN) 125 MCG capsule Take 1 capsule (125 mcg total) by mouth 2 (two) times daily.  Marland Kitchen dutasteride (AVODART) 0.5 MG capsule Take 0.5 mg by mouth daily.  . febuxostat (ULORIC) 40 MG tablet Take 40 mg by mouth daily.  Marland Kitchen gabapentin (NEURONTIN) 300 MG capsule Take by mouth.  . Magnesium 500 MG TABS Take 500 mg by mouth at  bedtime.  . methocarbamol (ROBAXIN) 750 MG tablet Take 1 tablet (750 mg total) by mouth every 8 (eight) hours as needed for muscle spasms. (Patient taking differently: Take 750 mg by mouth at bedtime.)  . metoprolol succinate (TOPROL-XL) 50 MG 24 hr tablet TAKE 1 TABLET BY MOUTH DAILY WITH OR IMMEDIATELY FOLLOWING A MEAL (Patient taking differently: Take 50 mg by mouth at bedtime.)  . metroNIDAZOLE (FLAGYL) 500 MG tablet TAKE 1 TABLET BY MOUTH TWICE DAILY ON SUNDAY AND MONDAY (Patient taking differently: Take 500 mg by mouth See admin instructions. Take 1 tablet (500 mg) by mouth twice daily on Saturdays & Sundays ONLY)  . Multiple Vitamins-Minerals (PRESERVISION/LUTEIN PO) Take 1 capsule by mouth in the morning.  Marland Kitchen NEEDLE, DISP, 18 G (BD DISP NEEDLES) 18G X 1-1/2" MISC 1 mg by Does not apply route every 14 (fourteen) days.  Marland Kitchen NEEDLE, DISP, 21 G (BD DISP NEEDLES) 21G X 1-1/2" MISC 1 mg by Does not apply route every 14 (fourteen) days.  Marland Kitchen oxyCODONE (OXY IR/ROXICODONE) 5 MG immediate release tablet Take 1 tablet (5 mg total) by mouth every 6 (six) hours as needed for moderate pain or breakthrough pain. (Patient taking differently: Take 5 mg by mouth in the morning and at bedtime.)  . oxyCODONE-acetaminophen (PERCOCET) 7.5-325 MG tablet Take 1-2 tablets by mouth every 4 (four) hours as needed for severe pain. (Patient taking differently: Take 1-2 tablets by mouth in the morning, at noon, and at bedtime.)  . polyethylene glycol (MIRALAX / GLYCOLAX) 17 g packet  Take 17 g by mouth daily.  . propranolol (INDERAL) 40 MG tablet TAKE ONE (1) TABLET THREE (3) TIMES EACHDAY AS NEEDED (Patient taking differently: Take 40 mg by mouth 3 (three) times daily as needed (for chest tightness or a-fib flares).)  . senna (SENOKOT) 8.6 MG tablet Take 1 tablet by mouth 2 (two) times daily.  . Syringe, Disposable, (2-3CC SYRINGE) 3 ML MISC 1 mg by Does not apply route every 14 (fourteen) days.  . tamsulosin (FLOMAX) 0.4 MG CAPS  capsule Take 1 capsule (0.4 mg total) by mouth daily after breakfast.  . testosterone cypionate (DEPOTESTOSTERONE CYPIONATE) 200 MG/ML injection Inject 1 mL (200 mg total) into the muscle every 28 (twenty-eight) days.  . traMADol HCl 100 MG TABS Take by mouth.  . Turmeric 500 MG CAPS Take 500 mg by mouth daily.   No facility-administered medications prior to visit.    Review of Systems  Constitutional: Negative for appetite change, chills and fever.  Respiratory: Negative for chest tightness, shortness of breath and wheezing.   Cardiovascular: Negative for chest pain and palpitations.  Gastrointestinal: Negative for abdominal pain, nausea and vomiting.    {Labs  Heme  Chem  Endocrine  Serology  Results Review (optional):23779::" "}   Objective    There were no vitals taken for this visit. {Show previous vital signs (optional):23777::" "}   Physical Exam  ***  No results found for any visits on 08/01/20.  Assessment & Plan     ***  No follow-ups on file.      {provider attestation***:1}   [name], MD  [facility] [phone] (phone) [phone] (fax)  [place]

## 2020-08-03 ENCOUNTER — Encounter: Payer: Self-pay | Admitting: Family Medicine

## 2020-08-15 NOTE — Progress Notes (Signed)
Remote pacemaker transmission.   

## 2020-08-16 ENCOUNTER — Other Ambulatory Visit: Payer: Self-pay | Admitting: Urology

## 2020-08-16 DIAGNOSIS — N401 Enlarged prostate with lower urinary tract symptoms: Secondary | ICD-10-CM
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
## 2020-08-23 ENCOUNTER — Other Ambulatory Visit: Payer: Self-pay

## 2020-08-23 ENCOUNTER — Ambulatory Visit: Payer: Medicare Other | Admitting: Urology

## 2020-08-23 VITALS — BP 160/96 | HR 71 | Wt 150.0 lb

## 2020-08-23 DIAGNOSIS — N401 Enlarged prostate with lower urinary tract symptoms: Secondary | ICD-10-CM

## 2020-08-23 DIAGNOSIS — R338 Other retention of urine: Secondary | ICD-10-CM

## 2020-08-23 LAB — BLADDER SCAN AMB NON-IMAGING: Scan Result: 1

## 2020-08-28 DIAGNOSIS — Z981 Arthrodesis status: Secondary | ICD-10-CM | POA: Diagnosis not present

## 2020-08-28 DIAGNOSIS — S32029G Unspecified fracture of second lumbar vertebra, subsequent encounter for fracture with delayed healing: Secondary | ICD-10-CM | POA: Diagnosis not present

## 2020-09-11 ENCOUNTER — Telehealth: Payer: Self-pay | Admitting: Family Medicine

## 2020-09-11 ENCOUNTER — Other Ambulatory Visit: Payer: Self-pay | Admitting: Dermatology

## 2020-09-11 DIAGNOSIS — M109 Gout, unspecified: Secondary | ICD-10-CM

## 2020-09-11 NOTE — Telephone Encounter (Signed)
ULoric 40 MG by mouth daily   Pt's wife called to request this Rx - she says right now the pt cannot take Colchicine for his gout because it could flag as a duplicate. Requesting above as an alternative.   [facility] [address] [place], [place] - [place]  [place] [address]  Phone: [phone] Fax: [phone]

## 2020-09-11 NOTE — Telephone Encounter (Signed)
Okay to fill? 

## 2020-09-12 MED ORDER — FEBUXOSTAT 40 MG PO TABS: 40.0000 mg | ORAL_TABLET | Freq: Every day | ORAL | 5 refills | Status: DC

## 2020-09-12 NOTE — Telephone Encounter (Signed)
Rx sent to pharmacy   

## 2020-09-12 NOTE — Telephone Encounter (Signed)
Uloric 40 mg daily, #30, 5 refills

## 2020-09-13 ENCOUNTER — Other Ambulatory Visit: Payer: Self-pay

## 2020-09-13 ENCOUNTER — Ambulatory Visit: Payer: Medicare Other | Admitting: Dermatology

## 2020-09-13 ENCOUNTER — Encounter: Payer: Self-pay | Admitting: Dermatology

## 2020-09-13 DIAGNOSIS — B353 Tinea pedis: Secondary | ICD-10-CM

## 2020-09-13 DIAGNOSIS — L814 Other melanin hyperpigmentation: Secondary | ICD-10-CM

## 2020-09-13 DIAGNOSIS — Z1283 Encounter for screening for malignant neoplasm of skin: Secondary | ICD-10-CM | POA: Diagnosis not present

## 2020-09-13 DIAGNOSIS — D229 Melanocytic nevi, unspecified: Secondary | ICD-10-CM

## 2020-09-13 DIAGNOSIS — L820 Inflamed seborrheic keratosis: Secondary | ICD-10-CM | POA: Diagnosis not present

## 2020-09-13 DIAGNOSIS — C44319 Basal cell carcinoma of skin of other parts of face: Secondary | ICD-10-CM | POA: Diagnosis not present

## 2020-09-13 DIAGNOSIS — L821 Other seborrheic keratosis: Secondary | ICD-10-CM | POA: Diagnosis not present

## 2020-09-13 DIAGNOSIS — D1800 Hemangioma unspecified site: Secondary | ICD-10-CM

## 2020-09-13 DIAGNOSIS — B351 Tinea unguium: Secondary | ICD-10-CM

## 2020-09-13 DIAGNOSIS — L720 Epidermal cyst: Secondary | ICD-10-CM

## 2020-09-13 DIAGNOSIS — D485 Neoplasm of uncertain behavior of skin: Secondary | ICD-10-CM

## 2020-09-13 DIAGNOSIS — L578 Other skin changes due to chronic exposure to nonionizing radiation: Secondary | ICD-10-CM

## 2020-09-13 DIAGNOSIS — C4491 Basal cell carcinoma of skin, unspecified: Secondary | ICD-10-CM

## 2020-09-13 DIAGNOSIS — L570 Actinic keratosis: Secondary | ICD-10-CM | POA: Diagnosis not present

## 2020-09-13 HISTORY — DX: Basal cell carcinoma of skin, unspecified: C44.91

## 2020-09-13 MED ORDER — TERBINAFINE HCL 250 MG PO TABS: 250.0000 mg | ORAL_TABLET | Freq: Every day | ORAL | 0 refills | Status: DC

## 2020-09-13 NOTE — Patient Instructions (Addendum)
If you have any questions or concerns for your doctor, please call our main line at [phone] and press option 4 to reach your doctor's medical assistant. If no one answers, please leave a voicemail as directed and we will return your call as soon as possible. Messages left after 4 pm will be answered the following business day.   You may also send us a message via MyChart. We typically respond to MyChart messages within 1-2 business days.  For prescription refills, please ask your pharmacy to contact our office. Our fax number is [phone].  If you have an urgent issue when the clinic is closed that cannot wait until the next business day, you can page your doctor at the number below.    Please note that while we do our best to be available for urgent issues outside of office hours, we are not available 24/7.   If you have an urgent issue and are unable to reach us, you may choose to seek medical care at your doctor's office, retail clinic, urgent care center, or emergency room.  If you have a medical emergency, please immediately call 911 or go to the emergency department.  Pager Numbers  - Dr. Kowalski: [phone]  - Dr. Moye: [phone]  - Dr. Stewart: [phone]  In the event of inclement weather, please call our main line at [phone] for an update on the status of any delays or closures.  Dermatology Medication Tips: Please keep the boxes that topical medications come in in order to help keep track of the instructions about where and how to use these. Pharmacies typically print the medication instructions only on the boxes and not directly on the medication tubes.   If your medication is too expensive, please contact our office at [phone] option 4 or send us a message through MyChart.   We are unable to tell what your co-pay for medications will be in advance as this is different depending on your insurance coverage. However, we may be able to find a  substitute medication at lower cost or fill out paperwork to get insurance to cover a needed medication.   If a prior authorization is required to get your medication covered by your insurance company, please allow us 1-2 business days to complete this process.  Drug prices often vary depending on where the prescription is filled and some pharmacies may offer cheaper prices.  The website www.goodrx.com contains coupons for medications through different pharmacies. The prices here do not account for what the cost may be with help from insurance (it may be cheaper with your insurance), but the website can give you the price if you did not use any insurance.  - You can print the associated coupon and take it with your prescription to the pharmacy.  - You may also stop by our office during regular business hours and pick up a GoodRx coupon card.  - If you need your prescription sent electronically to a different pharmacy, notify our office through [facility] MyChart or by phone at [phone] option 4.     Wound Care Instructions  1. Cleanse wound gently with soap and water once a day then pat dry with clean gauze. Apply a thing coat of Petrolatum (petroleum jelly, "Vaseline") over the wound (unless you have an allergy to this). We recommend that you use a new, sterile tube of Vaseline. Do not pick or remove scabs. Do not remove the yellow or white "healing tissue" from the base of the wound.    2. Cover the wound with fresh, clean, nonstick gauze and secure with paper tape. You may use Band-Aids in place of gauze and tape if the would is small enough, but would recommend trimming much of the tape off as there is often too much. Sometimes Band-Aids can irritate the skin.  3. You should call the office for your biopsy report after 1 week if you have not already been contacted.  4. If you experience any problems, such as abnormal amounts of bleeding, swelling, significant bruising, significant pain,  or evidence of infection, please call the office immediately.  5. FOR ADULT SURGERY PATIENTS: If you need something for pain relief you may take 1 extra strength Tylenol (acetaminophen) AND 2 Ibuprofen (200mg each) together every 4 hours as needed for pain. (do not take these if you are allergic to them or if you have a reason you should not take them.) Typically, you may only need pain medication for 1 to 3 days.     

## 2020-09-13 NOTE — Progress Notes (Signed)
Follow-Up Visit   Subjective  Thomas [name] is a 87 y.o. male who presents for the following: Lesions (On the top of the ears -sensitive, patient is concerned it may be another BCC. He also has lesions on the arms and a dark spot inside the L ear. Patient also c/o "athlete's foot" and would like to restart Terbinafine. He has also noticed two lesions near his R eye that he would like checked ). The patient presents for Upper Body Skin Exam (UBSE) for skin cancer screening and mole check.  The following portions of the chart were reviewed this encounter and updated as appropriate:   Tobacco  Allergies  Meds  Problems  Med Hx  Surg Hx  Fam Hx     Review of Systems:  No other skin or systemic complaints except as noted in HPI or Assessment and Plan.  Objective  Well appearing patient in no apparent distress; mood and affect are within normal limits.  All skin waist up examined.  Objective  Ears x 5, face x 1 (6): Erythematous thin papules/macules with gritty scale.   Objective  Left Ear: Firm SQ nodule  Objective  Right Temple: 0.4 cm flesh colored nodule.  Objective  B/L foot: Scaling and maceration web spaces and over distal and lateral soles. Toenail dystrophy.   Objective  Face x 5 (5): Erythematous keratotic or waxy stuck-on papule or plaque.   Assessment & Plan  AK (actinic keratosis) (6) Ears x 5, face x 1 Destruction of lesion - Ears x 5, face x 1 Complexity: simple   Destruction method: cryotherapy   Informed consent: discussed and consent obtained   Timeout:  patient name, date of birth, surgical site, and procedure verified Lesion destroyed using liquid nitrogen: Yes   Region frozen until ice ball extended beyond lesion: Yes   Outcome: patient tolerated procedure well with no complications   Post-procedure details: wound care instructions given    Epidermal inclusion cyst Left Ear Benign-appearing.  Observation.  Call clinic for new or changing  lesions.  Recommend daily use of broad spectrum spf 30+ sunscreen to sun-exposed areas.    Neoplasm of uncertain behavior of skin Right Temple Epidermal / dermal shaving  Lesion diameter (cm):  0.4 Informed consent: discussed and consent obtained   Timeout: patient name, date of birth, surgical site, and procedure verified   Procedure prep:  Patient was prepped and draped in usual sterile fashion Prep type:  Isopropyl alcohol Anesthesia: the lesion was anesthetized in a standard fashion   Anesthetic:  1% lidocaine w/ epinephrine 1-100,000 buffered w/ 8.4% NaHCO3 Instrument used: flexible razor blade   Hemostasis achieved with: pressure, aluminum chloride and electrodesiccation   Outcome: patient tolerated procedure well   Post-procedure details: sterile dressing applied and wound care instructions given   Dressing type: bandage and petrolatum    Specimen 1 - Surgical pathology Differential Diagnosis: D48.5 cyst r/o dysplasia  Check Margins: No 0.4 cm flesh colored nodule.  Tinea pedis of both feet B/L foot With tinea unguium, chronic and persistent - labs reviewed from March showed normal liver function tests  Start Terbinafine 250mg  po QD. Terbinafine Counseling  Terbinafine is an anti-fungal medicine that can be applied to the skin (over the counter) or taken by mouth (prescription) to treat fungal infections. The pill version is often used to treat fungal infections of the nails or scalp. While most people do not have any side effects from taking terbinafine pills, some possible side effects of  the medicine can include taste changes, headache, loss of smell, vision changes, nausea, vomiting, or diarrhea.   Rare side effects can include irritation of the liver, allergic reaction, or decrease in blood counts (which may show up as not feeling well or developing an infection). If you are concerned about any of these side effects, please stop the medicine and call your doctor, or in  the case of an emergency such as feeling very unwell, seek immediate medical care.   terbinafine (LAMISIL) 250 MG tablet - B/L foot  Inflamed seborrheic keratosis (5) Face x 5 Destruction of lesion - Face x 5 Complexity: simple   Destruction method: cryotherapy   Informed consent: discussed and consent obtained   Timeout:  patient name, date of birth, surgical site, and procedure verified Lesion destroyed using liquid nitrogen: Yes   Region frozen until ice ball extended beyond lesion: Yes   Outcome: patient tolerated procedure well with no complications   Post-procedure details: wound care instructions given    Skin cancer screening   Lentigines - Scattered tan macules - Due to sun exposure - Benign-appering, observe - Recommend daily broad spectrum sunscreen SPF 30+ to sun-exposed areas, reapply every 2 hours as needed. - Call for any changes  Seborrheic Keratoses - Stuck-on, waxy, tan-brown papules and/or plaques  - Benign-appearing - Discussed benign etiology and prognosis. - Observe - Call for any changes  Melanocytic Nevi - Tan-brown and/or pink-flesh-colored symmetric macules and papules - Benign appearing on exam today - Observation - Call clinic for new or changing moles - Recommend daily use of broad spectrum spf 30+ sunscreen to sun-exposed areas.   Hemangiomas - Red papules - Discussed benign nature - Observe - Call for any changes  Actinic Damage - Chronic condition, secondary to cumulative UV/sun exposure - diffuse scaly erythematous macules with underlying dyspigmentation - Recommend daily broad spectrum sunscreen SPF 30+ to sun-exposed areas, reapply every 2 hours as needed.  - Staying in the shade or wearing long sleeves, sun glasses (UVA+UVB protection) and wide brim hats (4-inch brim around the entire circumference of the hat) are also recommended for sun protection.  - Call for new or changing lesions.  Skin cancer screening performed  today.  Return in about 5 weeks (around 10/18/2020) for tinea pedis and unguium .  Luther Redo, CMA, am acting as scribe for Sarina Ser, MD .  Documentation: I have reviewed the above documentation for accuracy and completeness, and I agree with the above.  Sarina Ser, MD

## 2020-09-18 ENCOUNTER — Encounter: Payer: Self-pay | Admitting: Physical Therapy

## 2020-09-18 ENCOUNTER — Ambulatory Visit: Payer: Medicare Other | Attending: Urology | Admitting: Physical Therapy

## 2020-09-18 ENCOUNTER — Other Ambulatory Visit: Payer: Self-pay

## 2020-09-18 DIAGNOSIS — N39498 Other specified urinary incontinence: Secondary | ICD-10-CM | POA: Insufficient documentation

## 2020-09-18 DIAGNOSIS — M545 Low back pain, unspecified: Secondary | ICD-10-CM | POA: Diagnosis not present

## 2020-09-18 DIAGNOSIS — G8929 Other chronic pain: Secondary | ICD-10-CM | POA: Diagnosis not present

## 2020-09-18 DIAGNOSIS — R2689 Other abnormalities of gait and mobility: Secondary | ICD-10-CM | POA: Insufficient documentation

## 2020-09-18 DIAGNOSIS — M533 Sacrococcygeal disorders, not elsewhere classified: Secondary | ICD-10-CM | POA: Insufficient documentation

## 2020-09-18 DIAGNOSIS — M4125 Other idiopathic scoliosis, thoracolumbar region: Secondary | ICD-10-CM | POA: Diagnosis not present

## 2020-09-18 DIAGNOSIS — R293 Abnormal posture: Secondary | ICD-10-CM | POA: Insufficient documentation

## 2020-09-18 NOTE — Patient Instructions (Signed)
  Proper body mechanics with getting out of a chair to decrease strain  on back &pelvic floor   Avoid holding your breath when Getting out of the chair:  Scoot to front part of chair chair Heels behind knees, feet are hip width apart, nose over toes  Inhale like you are smelling roses Exhale to stand     ___  Sitting with feet flat on ground, not crossed   ___  Complete a chart detailing your methods for managing leakage and what activities

## 2020-09-19 ENCOUNTER — Encounter: Payer: Self-pay | Admitting: Dermatology
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
## 2020-09-20 ENCOUNTER — Telehealth: Payer: Self-pay

## 2020-09-20 ENCOUNTER — Other Ambulatory Visit: Payer: Self-pay | Admitting: Urology

## 2020-09-20 DIAGNOSIS — M461 Sacroiliitis, not elsewhere classified: Secondary | ICD-10-CM | POA: Diagnosis not present

## 2020-09-20 DIAGNOSIS — I10 Essential (primary) hypertension: Secondary | ICD-10-CM | POA: Diagnosis not present

## 2020-09-20 DIAGNOSIS — S32029G Unspecified fracture of second lumbar vertebra, subsequent encounter for fracture with delayed healing: Secondary | ICD-10-CM | POA: Diagnosis not present

## 2020-09-20 DIAGNOSIS — Z981 Arthrodesis status: Secondary | ICD-10-CM | POA: Diagnosis not present

## 2020-09-20 MED ORDER — MIRABEGRON ER 25 MG PO TB24: 25.0000 mg | ORAL_TABLET | Freq: Every day | ORAL | 12 refills | Status: DC

## 2020-09-20 NOTE — Telephone Encounter (Signed)
-----   Message from Ralene Bathe, MD sent at 09/18/2020 12:27 PM EDT ----- Diagnosis Skin , right temple BASAL CELL CARCINOMA, NODULOCYSTIC PATTERN  Cancer - BCC Schedule for treatment (EDC)

## 2020-09-20 NOTE — Progress Notes (Signed)
Prescription for Myrbetriq 25 mg daily sent to the pharmacy.

## 2020-09-20 NOTE — Telephone Encounter (Signed)
Left message for patient to call office for results/hd 

## 2020-09-27 ENCOUNTER — Telehealth: Payer: Self-pay

## 2020-09-27 NOTE — Telephone Encounter (Signed)
-----   Message from Ralene Bathe, MD sent at 09/18/2020 12:27 PM EDT ----- Diagnosis Skin , right temple BASAL CELL CARCINOMA, NODULOCYSTIC PATTERN  Cancer - BCC Schedule for treatment (EDC)

## 2020-09-27 NOTE — Telephone Encounter (Signed)
Advised patient of results and will plan EDC on follow up/hd

## 2020-09-28 ENCOUNTER — Ambulatory Visit: Payer: Medicare Other | Admitting: Cardiovascular Disease

## 2020-09-28 ENCOUNTER — Other Ambulatory Visit: Payer: Self-pay

## 2020-09-28 ENCOUNTER — Encounter: Payer: Self-pay | Admitting: Cardiovascular Disease

## 2020-09-28 VITALS — BP 108/64 | HR 71 | Ht 67.0 in | Wt 154.5 lb

## 2020-09-28 DIAGNOSIS — I10 Essential (primary) hypertension: Secondary | ICD-10-CM

## 2020-09-28 DIAGNOSIS — I4819 Other persistent atrial fibrillation: Secondary | ICD-10-CM | POA: Diagnosis not present

## 2020-09-28 DIAGNOSIS — E785 Hyperlipidemia, unspecified: Secondary | ICD-10-CM | POA: Diagnosis not present

## 2020-09-28 DIAGNOSIS — I2510 Atherosclerotic heart disease of native coronary artery without angina pectoris: Secondary | ICD-10-CM

## 2020-09-28 MED ORDER — ATORVASTATIN CALCIUM 40 MG PO TABS: 40.0000 mg | ORAL_TABLET | Freq: Every day | ORAL | 3 refills | Status: DC

## 2020-09-28 NOTE — Patient Instructions (Signed)
Medication Instructions:  Your physician has recommended you make the following change in your medication:   RESUME Atorvastatin 40 mg daily. An Rx has been sent to your pharmacy.   *If you need a refill on your cardiac medications before your next appointment, please call your pharmacy*   Lab Work: None ordered If you have labs (blood work) drawn today and your tests are completely normal, you will receive your results only by: Marland Kitchen MyChart Message (if you have MyChart) OR . A paper copy in the mail If you have any lab test that is abnormal or we need to change your treatment, we will call you to review the results.   Testing/Procedures: None ordered   Follow-Up: At [facility], you and your health needs are our priority.  As part of our continuing mission to provide you with exceptional heart care, we have created designated Provider Care Teams.  These Care Teams include your primary Cardiologist (physician) and Advanced Practice Providers (APPs -  Physician Assistants and Nurse Practitioners) who all work together to provide you with the care you need, when you need it.  We recommend signing up for the patient portal called "MyChart".  Sign up information is provided on this After Visit Summary.  MyChart is used to connect with patients for Virtual Visits (Telemedicine).  Patients are able to view lab/test results, encounter notes, upcoming appointments, etc.  Non-urgent messages can be sent to your provider as well.   To learn more about what you can do with MyChart, go to [email or web address].    Your next appointment:   Your physician wants you to follow-up in: 1 year You will receive a reminder letter in the mail two months in advance. If you don't receive a letter, please call our office to schedule the follow-up appointment.   The format for your next appointment:   In Person  Provider:   You may see [name], MD or one of the following Advanced Practice Providers  on your designated Care Team:    Murray Hodgkins, NP  Christell Faith, PA-C  Marrianne Mood, PA-C  Cadence [place], [place]  Laurann [place], NP    Other Instructions N/A
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
## 2020-09-29 ENCOUNTER — Ambulatory Visit: Payer: Medicare Other | Admitting: Physical Therapy

## 2020-09-29 DIAGNOSIS — M4125 Other idiopathic scoliosis, thoracolumbar region: Secondary | ICD-10-CM

## 2020-09-29 DIAGNOSIS — M533 Sacrococcygeal disorders, not elsewhere classified: Secondary | ICD-10-CM

## 2020-09-29 DIAGNOSIS — M545 Low back pain, unspecified: Secondary | ICD-10-CM

## 2020-09-29 DIAGNOSIS — R2689 Other abnormalities of gait and mobility: Secondary | ICD-10-CM | POA: Diagnosis not present

## 2020-09-29 DIAGNOSIS — R293 Abnormal posture: Secondary | ICD-10-CM | POA: Diagnosis not present

## 2020-09-29 DIAGNOSIS — G8929 Other chronic pain: Secondary | ICD-10-CM | POA: Diagnosis not present

## 2020-09-29 DIAGNOSIS — N39498 Other specified urinary incontinence: Secondary | ICD-10-CM

## 2020-09-29 NOTE — Patient Instructions (Addendum)
(   cork screw) ( handout)   To improve thoracic rotation)  15 reps   2 x day  __  Scoot hip to The R, rocking knees side to side  Repeat for other side   To lengthen paraspinal;   15 reps  2 x day   ____  Neck / shoulder stretches:    _angel wings, lower elbows down , keep arms touching bed  15  reps   --   Avoid straining pelvic floor, abdominal muscles , spine  Use log rolling technique instead of getting out of bed with your neck or the sit-up     Log rolling into and out of bed   Log rolling into and out of bed If getting out of bed on R side, Bent knees, scoot hips/ shoulder to L  Raise R arm completely overhead, rolling onto armpit  Then lower bent knees to bed to get into complete side lying position  Then drop legs off bed, and push up onto R elbow/forearm, and use L hand to push onto the bed

## 2020-09-30 NOTE — Therapy (Signed)
[place] MAIN [facility] SERVICES [address] [place], [place], [address] Phone: [phone]   Fax:  [phone]  Physical Therapy Treatment  Patient Details  Name: Thomas [name] MRN: [id number] Date of Birth: [birth year] Referring Provider (PT): Dawley and Erlene Quan   Encounter Date: 09/29/2020   PT End of Session - 09/29/20 1148    Visit Number 2    Number of Visits 10    Date for PT Re-Evaluation 11/27/20    PT Start Time 1100    PT Stop Time 1200    PT Time Calculation (min) 60 min    Activity Tolerance Patient tolerated treatment well    Behavior During Therapy [facility] for tasks assessed/performed           Past Medical History:  Diagnosis Date  . Aortic valve disorders   . Arthritis   . Atrial flutter ([place]) 06/18/2016   "AF or AFl; not sure which" (06/23/2016)  . Basal cell carcinoma    "face, nose left shoulder, left arm" (06/19/2016)  . Basal cell carcinoma 09/13/2020   right temple  . BBB (bundle branch block)    hx right  . Chronic back pain    "neck, thoracic, lower back" (06/19/2016)  . Complete heart block ([place]) 06/2016  . Dyspnea   . GERD (gastroesophageal reflux disease)   . Gout   . Heart block    "I've had type I, II Wenke before now" (06/19/2016)  . History of gout   . History of hiatal hernia    "self dx'd" (06/19/2016)  . Hyperlipidemia   . Hypertension   . Lyme disease    "dx'd by me 2003; cx's showed dx 08/2015"  . Migraine    "3-4/year" (06/19/2016)  . Presence of permanent cardiac pacemaker 06/19/2016  . PVC's (premature ventricular contractions)   . Renal cancer, left ([place]) 2006   S/P cryotherapy  . Spinal stenosis    "cervical, 1 thoracic, lumbar" (06/19/2016)  . Squamous carcinoma    "face, nose left shoulder, left arm" (06/19/2016)  . Stroke ([place])   . TIA (transient ischemic attack) 06/14/2016   "I'm not sure that's what it was" (06/25/2016)  . Visit for monitoring Tikosyn therapy 09/09/2017    Past  Surgical History:  Procedure Laterality Date  . ANKLE FRACTURE SURGERY Right 1967  . BACK SURGERY  05/07/2020  . BASAL CELL CARCINOMA EXCISION     "face, nose left shoulder, left arm"  . BIOPSY PROSTATE  2001 & 2003  . CARDIAC CATHETERIZATION  1990's  . CARDIOVERSION N/A 09/11/2017   Procedure: CARDIOVERSION;  Surgeon: Lelon Perla, MD;  Location: [facility] ENDOSCOPY;  Service: Cardiovascular;  Laterality: N/A;  . FRACTURE SURGERY    . HOLEP-LASER ENUCLEATION OF THE PROSTATE WITH MORCELLATION N/A 07/10/2020   Procedure: HOLEP-LASER ENUCLEATION OF THE PROSTATE WITH MORCELLATION;  Surgeon: Hollice Espy, MD;  Location: [facility] ORS;  Service: Urology;  Laterality: N/A;  . INGUINAL HERNIA REPAIR Left 2012  . INSERT / REPLACE / REMOVE PACEMAKER  06/19/2016  . LAPAROSCOPIC ABLATION RENAL MASS    . LEFT HEART CATH AND CORONARY ANGIOGRAPHY Left 10/23/2017   Procedure: LEFT HEART CATH AND CORONARY ANGIOGRAPHY;  Surgeon: Wellington Hampshire, MD;  Location: [place] CV LAB;  Service: Cardiovascular;  Laterality: Left;  . PACEMAKER IMPLANT N/A 06/19/2016   Procedure: Pacemaker Implant;  Surgeon: Deboraha Sprang, MD;  Location: [place] CV LAB;  Service: Cardiovascular;  Laterality: N/A;  . pacemasker    .  PROSTATE SURGERY    . SQUAMOUS CELL CARCINOMA EXCISION     "face, nose left shoulder, left arm"  . TONSILLECTOMY AND ADENOIDECTOMY      There were no vitals filed for this visit.   Subjective Assessment - 09/29/20 1106    Subjective Pt noticed exhalation supported his back and he has been practicing that.    Patient Stated Goals get his back straight and to learn how to deal chronic back ache in teh sacoiliac area and to regain control of his urinary leakage.              [facility] PT Assessment - 09/30/20 0952      Coordination   Coordination and Movement Description thoracic/ lumbar lacking segmental mobility in supine to rolling      Palpation   Spinal mobility thoracic / intercostal  tightness B ( L > R)    SI assessment  posterior tilt of pelvis                         [facility] Adult PT Treatment/Exercise - 09/30/20 0952      Posture/Postural Control   Posture Comments Post Tx: 26  cm from earlobe to wall      Therapeutic Activites    Other Therapeutic Activities explained about posture with anatomy pictures, relationship with pelvic floor function      Neuro Re-ed    Neuro Re-ed Details  Cued for HEP, posture, cervical ROM      Moist Heat Therapy   Number Minutes Moist Heat 5 Minutes    Moist Heat Location --   cervical/ thoracic (unbilled)     Manual Therapy   Manual therapy comments STM/MWM at thoracic spine, paraspinals, distraction at upper thorcacic and MWM at anterior neck mm to promote less forward head, thoracic kyphosis                       PT Long Term Goals - 09/18/20 1637      PT LONG TERM GOAL #1   Title Pt will complete a chart detailing the method he uses for continence in relationship with activities across each day of the week in order to gather data for baseline    Time 2    Period Weeks    Status New    Target Date 10/02/20      PT LONG TERM GOAL #2   Title Pt will improve gait mechanics and posture  to increase walking endurance from 20 min to > 30 min in order to walk with less pain    Time 6    Period Weeks    Status New    Target Date 10/30/20      PT LONG TERM GOAL #3   Title Pt will demo less forard head posture from 30 cm from earlobe to wall to < 25 cm in order to achieve more upright posture to optimize IAP system for postural stability ( less LBP) and urinary continence    Time 8    Period Weeks    Status New    Target Date 10/31/20      PT LONG TERM GOAL #4   Title Pt will demo IND with deep core coordination and pelvic floor coordination without compensatory patterns to help with ADLs    Time 4    Period Weeks    Status New    Target Date 10/17/20  PT LONG TERM GOAL #5   Title Pt  will demo less L thoracic shift, less convex curve at T/L junction and more reciprocal gait pattern in order to regain structural midline and to progress to pelvic floor contractions with better outcomes    Time 10    Period Weeks    Status New    Target Date 11/28/20      Additional Long Term Goals   Additional Long Term Goals Yes      PT LONG TERM GOAL #6   Title Pt will report decreased pain by 50% with Bending over, getting out of bed, reaching up overhead activities in order to perform his hobbies and household chores.    Time 8    Period Weeks    Status New    Target Date 11/14/20                 Plan - 09/29/20 1149    Clinical Impression Statement Pt demo'd less forward head posture with a distance from earlobe to wall changing from 30 cm to 26 cm post Tx today. Continued to require manual Tx to address scoliosis ( L thoracic lateral shift) and thoracic kyphosis spine before advancing to pelvic floor and deep core training pt will have better outcomes with a more aligned spine. Pt continues to benefit from skilled PT.    Personal Factors and Comorbidities Comorbidity 3+    Examination-Activity Limitations Toileting;Lift;Bed Mobility;Locomotion Level;Bend;Sit;Sleep;Continence;Squat;Stand;Transfers;Reach Overhead    Stability/Clinical Decision Making Evolving/Moderate complexity    Rehab Potential Good    PT Frequency 1x / week    PT Duration Other (comment)   10   PT Treatment/Interventions Moist Heat;Therapeutic activities;Therapeutic exercise;Patient/family education;Neuromuscular re-education;Stair training;Gait training;Manual techniques;Taping;Splinting;Dry needling;Balance training;ADLs/Self Care Home Management;Cryotherapy;Traction;Functional mobility training;Energy conservation;Joint Manipulations;Passive range of motion;Scar mobilization    Consulted and Agree with Plan of Care Patient;Family member/caregiver    Family Member Consulted wife           Patient  will benefit from skilled therapeutic intervention in order to improve the following deficits and impairments:  Decreased activity tolerance,Decreased endurance,Decreased range of motion,Decreased strength,Decreased coordination,Decreased mobility,Decreased scar mobility,Abnormal gait,Increased fascial restricitons,Impaired sensation,Improper body mechanics,Pain,Hypermobility,Increased muscle spasms,Hypomobility,Difficulty walking,Decreased knowledge of precautions,Decreased balance,Postural dysfunction,Cardiopulmonary status limiting activity,Decreased safety awareness  Visit Diagnosis: Abnormal posture  Other abnormalities of gait and mobility  Other idiopathic scoliosis, thoracolumbar region  Other urinary incontinence  Sacrococcygeal disorders, not elsewhere classified  Chronic low back pain without sciatica, unspecified back pain laterality     Problem List Patient Active Problem List   Diagnosis Date Noted  . Lumbar burst fracture ([place]) 05/04/2020  . Hypogonadism male 10/10/2019  . Persistent atrial fibrillation ([place]) 01/12/2019  . Acute low back pain 11/04/2017  . Abnormal screening cardiac CT   . Visit for monitoring Tikosyn therapy 09/09/2017  . History of stroke 08/29/2016  . Paroxysmal atrial fibrillation ([place]) 07/23/2016  . Coronary artery disease 07/23/2016  . Snores 06/27/2016  . Cardiac pacemaker in situ 06/25/2016  . Hemiparesis ([place]) 06/25/2016  . Acute ischemic stroke ([place])   . Acute left hemiparesis ([place])   . Hemisensory loss   . Dysarthria   . Stroke-like symptoms   . Complete heart block ([name])   . Pain in thoracic spine   . Orthostasis   . Lethargy   . Occlusion of vertebral artery   . TIA (transient ischemic attack) 06/14/2016  . Arthritis 02/06/2015  . Esophageal reflux 02/06/2015  . Arthritis urica 02/06/2015  . Cannot sleep 02/06/2015  .  Arthritis, degenerative 02/06/2015  . Adenocarcinoma, renal cell ([place]) 02/06/2015  . Benign prostatic  hyperplasia with lower urinary tract symptoms 11/21/2014  . Personal history of other malignant neoplasm of kidney 11/21/2014  . Palpitations 12/31/2013  . Hyperlipidemia 11/02/2010  . HYPERTENSION, BENIGN 04/16/2010    Jerl Mina ,PT, DPT, E-RYT  09/30/2020, 10:08 AM  [place] MAIN [facility] SERVICES [address] [place], [place], [address] Phone: [phone]   Fax:  [phone]  Name: JOHNAVON MCCLAFFERTY MRN: [id number] Date of Birth: Jan 20, 1933

## 2020-10-06 ENCOUNTER — Other Ambulatory Visit: Payer: Self-pay | Admitting: Internal Medicine

## 2020-10-06 NOTE — Telephone Encounter (Signed)
Requested medication (s) are due for refill today: requesting #90  Requested medication (s) are on the active medication list: yes  Last refill:  09/12/20 #30 5 refills   Future visit scheduled: no  Notes to clinic:  requesting 90 day supply. Last labs 02/24/2018     Requested Prescriptions  Pending Prescriptions Disp Refills   Febuxostat 80 MG TABS [Pharmacy Med Name: FEBUXOSTAT 80MG  TABLETS] 90 tablet     Sig: TAKE 1 TABLET BY MOUTH DAILY      Endocrinology: Gout Agents - febuxostat & probenecid Failed - 10/06/2020  6:35 PM      Failed - Uric Acid in normal range and within 360 days    Uric Acid  Date Value Ref Range Status  02/24/2018 6.3 3.7 - 8.6 mg/dL Final    Comment:               Therapeutic target for gout patients: <6.0          Passed - Valid encounter within last 12 months    Recent Outpatient Visits           2 months ago Dehydration   [facility] Jerrol Banana., MD   8 months ago Coronary artery disease involving native coronary artery of native heart without angina pectoris   [facility] Jerrol Banana., MD   10 months ago Viral upper respiratory tract infection   [facility] Jerrol Banana., MD   2 years ago Chest pain, unspecified type   [facility] Jerrol Banana., MD   2 years ago Gout, unspecified cause, unspecified chronicity, unspecified site   [facility] Jerrol Banana., MD       Future Appointments             In 1 week Ralene Bathe, MD [place]   In 4 months Hollice Espy, MD [place]

## 2020-10-09 ENCOUNTER — Ambulatory Visit: Payer: Medicare Other | Admitting: Physical Therapy

## 2020-10-11 ENCOUNTER — Other Ambulatory Visit: Payer: Self-pay | Admitting: Internal Medicine

## 2020-10-11 NOTE — Telephone Encounter (Signed)
This is a [place] pt 

## 2020-10-17 ENCOUNTER — Ambulatory Visit (INDEPENDENT_AMBULATORY_CARE_PROVIDER_SITE_OTHER): Payer: Medicare Other | Admitting: Family Medicine

## 2020-10-17 ENCOUNTER — Encounter: Payer: Self-pay | Admitting: Family Medicine

## 2020-10-17 ENCOUNTER — Other Ambulatory Visit: Payer: Self-pay

## 2020-10-17 ENCOUNTER — Ambulatory Visit: Payer: Medicare Other | Attending: Urology | Admitting: Physical Therapy

## 2020-10-17 VITALS — BP 131/68 | HR 68 | Temp 97.8°F | Resp 16 | Ht 67.0 in | Wt 155.0 lb

## 2020-10-17 DIAGNOSIS — I442 Atrioventricular block, complete: Secondary | ICD-10-CM | POA: Diagnosis not present

## 2020-10-17 DIAGNOSIS — M4125 Other idiopathic scoliosis, thoracolumbar region: Secondary | ICD-10-CM | POA: Diagnosis not present

## 2020-10-17 DIAGNOSIS — Z0000 Encounter for general adult medical examination without abnormal findings: Secondary | ICD-10-CM | POA: Diagnosis not present

## 2020-10-17 DIAGNOSIS — N39498 Other specified urinary incontinence: Secondary | ICD-10-CM | POA: Insufficient documentation

## 2020-10-17 DIAGNOSIS — R2689 Other abnormalities of gait and mobility: Secondary | ICD-10-CM | POA: Insufficient documentation

## 2020-10-17 DIAGNOSIS — I4819 Other persistent atrial fibrillation: Secondary | ICD-10-CM | POA: Diagnosis not present

## 2020-10-17 DIAGNOSIS — C642 Malignant neoplasm of left kidney, except renal pelvis: Secondary | ICD-10-CM

## 2020-10-17 DIAGNOSIS — M545 Low back pain, unspecified: Secondary | ICD-10-CM | POA: Insufficient documentation

## 2020-10-17 DIAGNOSIS — E782 Mixed hyperlipidemia: Secondary | ICD-10-CM | POA: Diagnosis not present

## 2020-10-17 DIAGNOSIS — G8929 Other chronic pain: Secondary | ICD-10-CM | POA: Insufficient documentation

## 2020-10-17 DIAGNOSIS — Z23 Encounter for immunization: Secondary | ICD-10-CM

## 2020-10-17 DIAGNOSIS — E291 Testicular hypofunction: Secondary | ICD-10-CM | POA: Diagnosis not present

## 2020-10-17 DIAGNOSIS — R293 Abnormal posture: Secondary | ICD-10-CM | POA: Diagnosis not present

## 2020-10-17 DIAGNOSIS — I10 Essential (primary) hypertension: Secondary | ICD-10-CM

## 2020-10-17 DIAGNOSIS — S32001D Stable burst fracture of unspecified lumbar vertebra, subsequent encounter for fracture with routine healing: Secondary | ICD-10-CM | POA: Diagnosis not present

## 2020-10-17 DIAGNOSIS — M533 Sacrococcygeal disorders, not elsewhere classified: Secondary | ICD-10-CM | POA: Diagnosis not present

## 2020-10-17 DIAGNOSIS — I2510 Atherosclerotic heart disease of native coronary artery without angina pectoris: Secondary | ICD-10-CM

## 2020-10-17 NOTE — Therapy (Signed)
[place] MAIN [facility] SERVICES [address] [place], [place], [address] Phone: [phone]   Fax:  [phone]  Physical Therapy Treatment  Patient Details  Name: Thomas [name] MRN: [id number] Date of Birth: May 11, 1932 Referring Provider (PT): Dawley and Erlene Quan   Encounter Date: 10/17/2020   PT End of Session - 10/17/20 1808     Visit Number 3    Number of Visits 10    Date for PT Re-Evaluation 11/27/20    PT Start Time 7902    PT Stop Time 1640    PT Time Calculation (min) 60 min    Activity Tolerance Patient tolerated treatment well    Behavior During Therapy [facility] for tasks assessed/performed             Past Medical History:  Diagnosis Date   Aortic valve disorders    Arthritis    Atrial flutter ([place]) 06/18/2016   "AF or AFl; not sure which" (06/23/2016)   Basal cell carcinoma    "face, nose left shoulder, left arm" (06/19/2016)   Basal cell carcinoma 09/13/2020   right temple   BBB (bundle branch block)    hx right   Chronic back pain    "neck, thoracic, lower back" (06/19/2016)   Complete heart block ([place]) 06/2016   Dyspnea    GERD (gastroesophageal reflux disease)    Gout    Heart block    "I've had type I, II Wenke before now" (06/19/2016)   History of gout    History of hiatal hernia    "self dx'd" (06/19/2016)   Hyperlipidemia    Hypertension    Lyme disease    "dx'd by me 2003; cx's showed dx 08/2015"   Migraine    "3-4/year" (06/19/2016)   Presence of permanent cardiac pacemaker 06/19/2016   PVC's (premature ventricular contractions)    Renal cancer, left ([place]) 2006   S/P cryotherapy   Spinal stenosis    "cervical, 1 thoracic, lumbar" (06/19/2016)   Squamous carcinoma    "face, nose left shoulder, left arm" (06/19/2016)   Stroke ([place])    TIA (transient ischemic attack) 06/14/2016   "I'm not sure that's what it was" (06/25/2016)   Visit for monitoring Tikosyn therapy 09/09/2017    Past Surgical History:   Procedure Laterality Date   ANKLE FRACTURE SURGERY Right 1967   BACK SURGERY  05/07/2020   BASAL CELL CARCINOMA EXCISION     "face, nose left shoulder, left arm"   BIOPSY PROSTATE  2001 & 2003   CARDIAC CATHETERIZATION  1990's   CARDIOVERSION N/A 09/11/2017   Procedure: CARDIOVERSION;  Surgeon: Lelon Perla, MD;  Location: [facility];  Service: Cardiovascular;  Laterality: N/A;   FRACTURE SURGERY     HOLEP-LASER ENUCLEATION OF THE PROSTATE WITH MORCELLATION N/A 07/10/2020   Procedure: HOLEP-LASER ENUCLEATION OF THE PROSTATE WITH MORCELLATION;  Surgeon: Hollice Espy, MD;  Location: [facility] ORS;  Service: Urology;  Laterality: N/A;   INGUINAL HERNIA REPAIR Left 2012   INSERT / REPLACE / REMOVE PACEMAKER  06/19/2016   LAPAROSCOPIC ABLATION RENAL MASS     LEFT HEART CATH AND CORONARY ANGIOGRAPHY Left 10/23/2017   Procedure: LEFT HEART CATH AND CORONARY ANGIOGRAPHY;  Surgeon: Wellington Hampshire, MD;  Location: [place] [facility];  Service: Cardiovascular;  Laterality: Left;   PACEMAKER IMPLANT N/A 06/19/2016   Procedure: Pacemaker Implant;  Surgeon: Deboraha Sprang, MD;  Location: [place] [facility];  Service: Cardiovascular;  Laterality: N/A;  pacemasker     PROSTATE SURGERY     SQUAMOUS CELL CARCINOMA EXCISION     "face, nose left shoulder, left arm"   TONSILLECTOMY AND ADENOIDECTOMY      There were no vitals filed for this visit.   Subjective Assessment - 10/17/20 1551     Subjective Pt does not have as much numbness at his sphincters. Pt is beginning to have sensation at rectal sphincter. Pt is able to tighten up a muscle that helped with leakage this week. His leakage has decreased the past weeks    Pertinent History Lumbar imagining 05/09/19   "L2 compression deformity is again identified. L1 and L3 pedicle  screws are seen with posterior fixation identified. The overall  appearance is stable from the intraoperative exam. Mild degenerative  anterolisthesis of L4 on L5 is seen.  Intraoperative spot films of the lumbar spine demonstrate initially  2 posterior spinal needles marking the T12 and L3 spinous processes."   HOLEP 07/2020    Patient Stated Goals get his back straight and to learn how to deal chronic back ache in teh sacoiliac area and to regain control of his urinary leakage.                [facility] PT Assessment - 10/17/20 1555       Coordination   Coordination and Movement Description more mobility with low trunk rotation      Palpation   Spinal mobility less scoliotic curves, throacic kyphosis still present but improved    Palpation comment tightness along interspinals/ paraspinals at T/L and cervical segments. T4 hypomobile                           [facility] Adult PT Treatment/Exercise - 10/17/20 1818       Neuro Re-ed    Neuro Re-ed Details  Cued for scapular retraction/ depression      Manual Therapy   Manual therapy comments STM/MWM at problem areas noted in assessment. graded movement with lower trunk rotation in hooklying ( 15 deg from mdiline given area of fusion at T12-L3 and mild degenerative anterolisthesis of L4 on L5 based on imaging reports)                         PT Long Term Goals - 09/18/20 1637       PT LONG TERM GOAL #1   Title Pt will complete a chart detailing the method he uses for continence in relationship with activities across each day of the week in order to gather data for baseline    Time 2    Period Weeks    Status New    Target Date 10/02/20      PT LONG TERM GOAL #2   Title Pt will improve gait mechanics and posture  to increase walking endurance from 20 min to > 30 min in order to walk with less pain    Time 6    Period Weeks    Status New    Target Date 10/30/20      PT LONG TERM GOAL #3   Title Pt will demo less forard head posture from 30 cm from earlobe to wall to < 25 cm in order to achieve more upright posture to optimize IAP system for postural stability ( less LBP)  and urinary continence    Time 8    Period Weeks    Status New  Target Date 10/31/20      PT LONG TERM GOAL #4   Title Pt will demo IND with deep core coordination and pelvic floor coordination without compensatory patterns to help with ADLs    Time 4    Period Weeks    Status New    Target Date 10/17/20      PT LONG TERM GOAL #5   Title Pt will demo less L thoracic shift, less convex curve at T/L junction and more reciprocal gait pattern in order to regain structural midline and to progress to pelvic floor contractions with better outcomes    Time 10    Period Weeks    Status New    Target Date 11/28/20      Additional Long Term Goals   Additional Long Term Goals Yes      PT LONG TERM GOAL #6   Title Pt will report decreased pain by 50% with Bending over, getting out of bed, reaching up overhead activities in order to perform his hobbies and household chores.    Time 8    Period Weeks    Status New    Target Date 11/14/20                   Plan - 10/17/20 1646     Clinical Impression Statement Pt is making positive improvements with report of decreasing leakage and gradually increased sensation of sphincters.   Pt demonstrates less scoliotic curves but thoracic kyphosis and forward head still required manual Tx.  Graded movement with lower trunk rotation in hooklying was performed 15 deg from mdiline given area of fusion at T12-L3  and mild degenerative anterolisthesis of L4 on L5 based on imaging reports.  Pt demo'd increased cervical retraction, rotation post Tx but sideflexion remains very limited. Initiated scapulothoracic strengthening today which will help with minimizing forward head and thoracic kyphosis which will in turn help with restoring anterior tilt of pelvis to help with pelvic floor function. Pt continues to benefit from skilled PT     Personal Factors and Comorbidities Comorbidity 3+    Comorbidities Lumbar fusion L1-3    Examination-Activity  Limitations Toileting;Lift;Bed Mobility;Locomotion Level;Bend;Sit;Sleep;Continence;Squat;Stand;Transfers;Reach Overhead    Stability/Clinical Decision Making Evolving/Moderate complexity    Rehab Potential Good    PT Frequency 1x / week    PT Duration Other (comment)   10   PT Treatment/Interventions Moist Heat;Therapeutic activities;Therapeutic exercise;Patient/family education;Neuromuscular re-education;Stair training;Gait training;Manual techniques;Taping;Splinting;Dry needling;Balance training;ADLs/Self Care Home Management;Cryotherapy;Traction;Functional mobility training;Energy conservation;Joint Manipulations;Passive range of motion;Scar mobilization    Consulted and Agree with Plan of Care Patient;Family member/caregiver    Family Member Consulted wife             Patient will benefit from skilled therapeutic intervention in order to improve the following deficits and impairments:  Decreased activity tolerance, Decreased endurance, Decreased range of motion, Decreased strength, Decreased coordination, Decreased mobility, Decreased scar mobility, Abnormal gait, Increased fascial restricitons, Impaired sensation, Improper body mechanics, Pain, Hypermobility, Increased muscle spasms, Hypomobility, Difficulty walking, Decreased knowledge of precautions, Decreased balance, Postural dysfunction, Cardiopulmonary status limiting activity, Decreased safety awareness  Visit Diagnosis: Abnormal posture  Other abnormalities of gait and mobility  Other idiopathic scoliosis, thoracolumbar region  Other urinary incontinence  Sacrococcygeal disorders, not elsewhere classified  Chronic low back pain without sciatica, unspecified back pain laterality     Problem List Patient Active Problem List   Diagnosis Date Noted   Lumbar burst fracture ([place]) 05/04/2020   Hypogonadism male 10/10/2019  Persistent atrial fibrillation ([place]) 01/12/2019   Acute low back pain 11/04/2017   Abnormal  screening cardiac CT    Visit for monitoring Tikosyn therapy 09/09/2017   History of stroke 08/29/2016   Paroxysmal atrial fibrillation ([place]) 07/23/2016   Coronary artery disease 07/23/2016   Snores 06/27/2016   Cardiac pacemaker in situ 06/25/2016   Hemiparesis ([place]) 06/25/2016   Acute ischemic stroke ([place])    Acute left hemiparesis ([facility])    Hemisensory loss    Dysarthria    Stroke-like symptoms    Complete heart block ([facility])    Pain in thoracic spine    Orthostasis    Lethargy    Occlusion of vertebral artery    TIA (transient ischemic attack) 06/14/2016   Arthritis 02/06/2015   Esophageal reflux 02/06/2015   Arthritis urica 02/06/2015   Cannot sleep 02/06/2015   Arthritis, degenerative 02/06/2015   Adenocarcinoma, renal cell ([place]) 02/06/2015   Benign prostatic hyperplasia with lower urinary tract symptoms 11/21/2014   Personal history of other malignant neoplasm of kidney 11/21/2014   Palpitations 12/31/2013   Hyperlipidemia 11/02/2010   HYPERTENSION, BENIGN 04/16/2010    Jerl Mina ,PT, DPT, E-RYT  10/17/2020, 6:24 PM  [place] MAIN [facility] SERVICES [address] [place], [place], [address] Phone: [phone]   Fax:  [phone]  Name: Thomas [name] MRN: [id number] Date of Birth: 03-Apr-1933

## 2020-10-17 NOTE — Patient Instructions (Addendum)
   Neck / shoulder stretches:    Lying on back - pillow for now but as your posture improves, you can use a small rolled towel  _ 6 directions  5 reps    _angel wings, lower elbows down , keep arms touching bed  10 reps    __  Lying on back, knees bent    band under ballmounds  while laying on back w/ knees bent  "W" exercise  20 reps  Band is placed under feet, knees bent, feet are hip width apart Hold band with thumbs point out, keep upper arm and elbow touching the bed the whole time  - inhale and then exhale pull bands by bending elbows hands move in a "w"  (feel shoulder blades squeezing)     _______  Seated cat cow:  Dragging hands on thighs, chest lifts before point of pain  10 reps throughout day 3 sets - 5 sets

## 2020-10-17 NOTE — Progress Notes (Signed)
Complete physical exam   Patient: Thomas [name]   DOB: 19-Jun-1932   86 y.o. Male  MRN: [id number] Visit Date: 10/17/2020  Today's healthcare provider: [name], MD   Chief Complaint  Patient presents with   Annual Exam   Subjective    Thomas [name] is a 87 y.o. male who presents today for a complete physical exam.  He reports consuming a general diet. The patient does not participate in regular exercise at present. He generally feels fairly well. He reports sleeping well. He does not have additional problems to discuss today.  He has healed well from extremely traumatic spinal injury without neurologic deficit were reviewed suffered a burst fracture to thoracic spine. Past Medical History:  Diagnosis Date   Aortic valve disorders    Arthritis    Atrial flutter ([place]) 06/18/2016   "AF or AFl; not sure which" (06/23/2016)   Basal cell carcinoma    "face, nose left shoulder, left arm" (06/19/2016)   Basal cell carcinoma 09/13/2020   right temple   BBB (bundle branch block)    hx right   Chronic back pain    "neck, thoracic, lower back" (06/19/2016)   Complete heart block ([place]) 06/2016   Dyspnea    GERD (gastroesophageal reflux disease)    Gout    Heart block    "I've had type I, II Wenke before now" (06/19/2016)   History of gout    History of hiatal hernia    "self dx'd" (06/19/2016)   Hyperlipidemia    Hypertension    Lyme disease    "dx'd by me 2003; cx's showed dx 08/2015"   Migraine    "3-4/year" (06/19/2016)   Presence of permanent cardiac pacemaker 06/19/2016   PVC's (premature ventricular contractions)    Renal cancer, left ([place]) 2006   S/P cryotherapy   Spinal stenosis    "cervical, 1 thoracic, lumbar" (06/19/2016)   Squamous carcinoma    "face, nose left shoulder, left arm" (06/19/2016)   Stroke ([place])    TIA (transient ischemic attack) 06/14/2016   "I'm not sure that's what it was" (06/25/2016)   Visit for monitoring Tikosyn therapy 09/09/2017    Past Surgical History:  Procedure Laterality Date   ANKLE FRACTURE SURGERY Right 1967   BACK SURGERY  05/07/2020   BASAL CELL CARCINOMA EXCISION     "face, nose left shoulder, left arm"   BIOPSY PROSTATE  2001 & 2003   CARDIAC CATHETERIZATION  1990's   CARDIOVERSION N/A 09/11/2017   Procedure: CARDIOVERSION;  Surgeon: Lelon Perla, MD;  Location: [facility];  Service: Cardiovascular;  Laterality: N/A;   FRACTURE SURGERY     HOLEP-LASER ENUCLEATION OF THE PROSTATE WITH MORCELLATION N/A 07/10/2020   Procedure: HOLEP-LASER ENUCLEATION OF THE PROSTATE WITH MORCELLATION;  Surgeon: Hollice Espy, MD;  Location: [facility];  Service: Urology;  Laterality: N/A;   INGUINAL HERNIA REPAIR Left 2012   INSERT / REPLACE / REMOVE PACEMAKER  06/19/2016   LAPAROSCOPIC ABLATION RENAL MASS     LEFT HEART CATH AND CORONARY ANGIOGRAPHY Left 10/23/2017   Procedure: LEFT HEART CATH AND CORONARY ANGIOGRAPHY;  Surgeon: Wellington Hampshire, MD;  Location: [facility];  Service: Cardiovascular;  Laterality: Left;   PACEMAKER IMPLANT N/A 06/19/2016   Procedure: Pacemaker Implant;  Surgeon: Deboraha Sprang, MD;  Location: [facility];  Service: Cardiovascular;  Laterality: N/A;   pacemasker     PROSTATE SURGERY     SQUAMOUS  CELL CARCINOMA EXCISION     "face, nose left shoulder, left arm"   TONSILLECTOMY AND ADENOIDECTOMY     Social History   Socioeconomic History   Marital status: Married    Spouse name: Not on file   Number of children: 3   Years of education: MD    Highest education level: Master's degree (e.g., MA, MS, MEng, MEd, MSW, MBA)  Occupational History   Occupation: Retired   Tobacco Use   Smoking status: Never   Smokeless tobacco: Never  Vaping Use   Vaping Use: Never used  Substance and Sexual Activity   Alcohol use: Yes    Alcohol/week: 1.0 standard drink    Types: 1 Glasses of wine per week   Drug use: No   Sexual activity: Yes  Other Topics Concern   Not on file   Social History Narrative   Independent at baseline. Lives with family   Drinks tea in the morning    Social Determinants of Health   Financial Resource Strain: Not on file  Food Insecurity: Not on file  Transportation Needs: Not on file  Physical Activity: Not on file  Stress: Not on file  Social Connections: Not on file  Intimate Partner Violence: Not on file   Family Status  Relation Name Status   Brother  Alive   Mother  Deceased at age 58   Father  Deceased at age 70   Family History  Problem Relation Age of Onset   Heart attack Brother    Stroke Brother    Aortic stenosis Mother    Arthritis Father    Allergies  Allergen Reactions   Other     Vicryl sutures - patient states that site becomes "soupy"    Iodine Hives and Other (See Comments)    IVP contrast   Penicillins Itching, Rash and Other (See Comments)    ITCHY FEELING IN FINGERS Has patient had a PCN reaction causing immediate rash, facial/tongue/throat swelling, SOB or lightheadedness with hypotension: No Has patient had a PCN reaction causing severe rash involving mucus membranes or skin necrosis: No Has patient had a PCN reaction that required hospitalization No Has patient had a PCN reaction occurring within the last 10 years: No If all of the above answers are "NO", then may proceed with Cephalosporin use.     Patient Care Team: Jerrol Banana., MD as PCP - General (Family Medicine) Wellington Hampshire, MD as PCP - Cardiology (Cardiology) Deboraha Sprang, MD as PCP - Electrophysiology (Cardiology) Minna Merritts, MD as Consulting Physician (Cardiology) Deboraha Sprang, MD as Consulting Physician (Cardiology) Leandrew Koyanagi, MD as Referring Physician (Ophthalmology) Wellington Hampshire, MD as Consulting Physician (Cardiology) Abbie Sons, MD (Urology) Emmaline Kluver., MD (Rheumatology) Ralene Bathe, MD (Dermatology)   Medications: Outpatient Medications Prior to  Visit  Medication Sig   amLODipine (NORVASC) 5 MG tablet TAKE 1 TABLET BY MOUTH DAILY   apixaban (ELIQUIS) 5 MG TABS tablet Take 1 tablet (5 mg total) by mouth 2 (two) times daily.   atorvastatin (LIPITOR) 40 MG tablet Take 1 tablet (40 mg total) by mouth daily.   calcium carbonate (TUMS - DOSED IN MG ELEMENTAL CALCIUM) 500 MG chewable tablet Chew 1-2 tablets by mouth daily as needed for indigestion.   Cholecalciferol (VITAMIN D3) 5000 units CAPS Take 5,000 Units by mouth daily.   Coenzyme Q10 (COQ10) 100 MG CAPS Take 100 mg by mouth daily.   Cyanocobalamin (VITAMIN B-12  PO) Take 3,000 mcg by mouth daily with breakfast.   dofetilide (TIKOSYN) 125 MCG capsule Take 1 capsule (125 mcg total) by mouth 2 (two) times daily.   febuxostat (ULORIC) 40 MG tablet Take 1 tablet (40 mg total) by mouth daily.   Febuxostat 80 MG TABS TAKE 1 TABLET BY MOUTH DAILY   gabapentin (NEURONTIN) 300 MG capsule Take 300 mg by mouth 2 (two) times daily.   Magnesium 500 MG TABS Take 500 mg by mouth at bedtime.   methocarbamol (ROBAXIN) 750 MG tablet Take 750 mg by mouth 2 times daily at 12 noon and 4 pm.   metoprolol succinate (TOPROL-XL) 50 MG 24 hr tablet TAKE 1 TABLET BY MOUTH DAILY WITH OR IMMEDIATELY FOLLOWING A MEAL   metroNIDAZOLE (FLAGYL) 500 MG tablet TAKE 1 TABLET BY MOUTH TWICE DAILY ON SUNDAY AND MONDAY   mirabegron ER (MYRBETRIQ) 25 MG TB24 tablet Take 1 tablet (25 mg total) by mouth daily.   Multiple Vitamins-Minerals (PRESERVISION/LUTEIN PO) Take 1 capsule by mouth in the morning.   NEEDLE, DISP, 18 G (BD DISP NEEDLES) 18G X 1-1/2" MISC 1 mg by Does not apply route every 14 (fourteen) days.   NEEDLE, DISP, 21 G (BD DISP NEEDLES) 21G X 1-1/2" MISC 1 mg by Does not apply route every 14 (fourteen) days.   oxyCODONE (OXY IR/ROXICODONE) 5 MG immediate release tablet Take 1 tablet (5 mg total) by mouth every 6 (six) hours as needed for moderate pain or breakthrough pain.   oxyCODONE-acetaminophen (PERCOCET)  7.5-325 MG tablet Take 1-2 tablets by mouth every 4 (four) hours as needed for severe pain.   polyethylene glycol (MIRALAX / GLYCOLAX) 17 g packet Take 17 g by mouth as needed.   propranolol (INDERAL) 40 MG tablet TAKE ONE (1) TABLET THREE (3) TIMES EACHDAY AS NEEDED   senna (SENOKOT) 8.6 MG tablet Take 1 tablet by mouth 2 (two) times daily.   Syringe, Disposable, (2-3CC SYRINGE) 3 ML MISC 1 mg by Does not apply route every 14 (fourteen) days.   terbinafine (LAMISIL) 250 MG tablet Take 1 tablet (250 mg total) by mouth daily.   testosterone cypionate (DEPOTESTOSTERONE CYPIONATE) 200 MG/ML injection Inject 1 mL (200 mg total) into the muscle every 28 (twenty-eight) days.   traMADol (ULTRAM) 50 MG tablet Take 50-100 mg by mouth 4 (four) times daily as needed.   traMADol (ULTRAM-ER) 100 MG 24 hr tablet Take 100 mg by mouth as needed for pain.   Turmeric 500 MG CAPS Take 500 mg by mouth daily.   No facility-administered medications prior to visit.    Review of Systems  Constitutional: Negative.   HENT: Negative.    Eyes: Negative.   Respiratory: Negative.    Cardiovascular: Negative.   Gastrointestinal: Negative.   Endocrine: Negative.   Genitourinary: Negative.   Musculoskeletal:  Positive for arthralgias and back pain.  Skin: Negative.   Allergic/Immunologic: Negative.   Neurological: Negative.   Hematological: Negative.   Psychiatric/Behavioral: Negative.    All other systems reviewed and are negative.     Objective    BP 131/68   Pulse 68   Temp 97.8 F (36.6 C)   Resp 16   Ht 5\' 7"  (1.702 m)   Wt 155 lb (70.3 kg)   BMI 24.28 kg/m  BP Readings from Last 3 Encounters:  10/17/20 131/68  09/28/20 108/64  08/23/20 (!) 160/96   Wt Readings from Last 3 Encounters:  10/17/20 155 lb (70.3 kg)  09/28/20 154 lb 8  oz (70.1 kg)  08/23/20 150 lb (68 kg)      Physical Exam Vitals reviewed.  HENT:     Head: Normocephalic and atraumatic.     Right Ear: External ear normal.      Left Ear: External ear normal.     Nose: Nose normal.  Eyes:     General: No scleral icterus.    Conjunctiva/sclera: Conjunctivae normal.  Cardiovascular:     Rate and Rhythm: Normal rate and regular rhythm.     Pulses: Normal pulses.     Heart sounds: Normal heart sounds.  Pulmonary:     Effort: Pulmonary effort is normal.     Breath sounds: Normal breath sounds.  Abdominal:     Palpations: Abdomen is soft.  Genitourinary:    Penis: Normal.      Testes: Normal.  Skin:    General: Skin is warm and dry.  Neurological:     General: No focal deficit present.     Mental Status: He is alert and oriented to person, place, and time.  Psychiatric:        Mood and Affect: Mood normal.        Behavior: Behavior normal.        Thought Content: Thought content normal.        Judgment: Judgment normal.      Last depression screening scores PHQ 2/9 Scores 10/11/2019 06/16/2019 07/08/2018  PHQ - 2 Score 0 0 0   Last fall risk screening Fall Risk  10/11/2019  Falls in the past year? 0  Number falls in past yr: -  Injury with Fall? -   Last Audit-C alcohol use screening Alcohol Use Disorder Test (AUDIT) 06/16/2019  1. How often do you have a drink containing alcohol? 2  2. How many drinks containing alcohol do you have on a typical day when you are drinking? 0  3. How often do you have six or more drinks on one occasion? 0  AUDIT-C Score 2   A score of 3 or more in women, and 4 or more in men indicates increased risk for alcohol abuse, EXCEPT if all of the points are from question 1   No results found for any visits on 10/17/20.  Assessment & Plan    Routine Health Maintenance and Physical Exam  Exercise Activities and Dietary recommendations  Goals      Increase water intake     Recommend increasing water intake to 7-8 glasses of water a day.         Immunization History  Administered Date(s) Administered   Fluad Quad(high Dose 65+) 02/04/2019, 05/09/2020   Influenza,  High Dose Seasonal PF 02/07/2015, 03/28/2018   PFIZER(Purple Top)SARS-COV-2 Vaccination 05/17/2019, 06/07/2019   Pneumococcal Polysaccharide-23 05/06/2020    Health Maintenance  Topic Date Due   TETANUS/TDAP  Never done   Zoster Vaccines- Shingrix (1 of 2) Never done   COVID-19 Vaccine (3 - Pfizer risk series) 07/05/2019   INFLUENZA VACCINE  12/04/2020   PNA vac Low Risk Adult (2 of 2 - PCV13) 05/06/2021   HPV VACCINES  Aged Out    Discussed health benefits of physical activity, and encouraged him to engage in regular exercise appropriate for his age and condition.  1. Annual physical exam   2. Essential hypertension Good control- CBC with Differential/Platelet - Comprehensive metabolic panel  3. Mixed hyperlipidemia  - Lipid panel - TSH  4. Hypogonadism male Followed by urology he is on monthly testosterone shot and  daily gel - Testosterone  5. Closed burst fracture of lumbar vertebra with routine healing, subsequent encounter ObtainBMD although I think this was severe trauma - DG Bone Density; Future  6. Need for COVID-19 vaccine  - PFIZER Comirnaty(GRAY TOP)COVID-19 Vaccine  7. Coronary artery disease involving native coronary artery of native heart without angina pectoris   8. Persistent atrial fibrillation ([place])   9. Complete heart block ([place])   10. Renal cell carcinoma of left kidney ([place]) Followed by urology   No follow-ups on file.     I, [name], MD, have reviewed all documentation for this visit. The documentation on 10/20/20 for the exam, diagnosis, procedures, and orders are all accurate and complete.    Heavenlee Maiorana Cranford Mon, MD  [facility] [phone] (phone) [phone] (fax)  [place]

## 2020-10-18 ENCOUNTER — Other Ambulatory Visit: Payer: Self-pay

## 2020-10-18 ENCOUNTER — Ambulatory Visit: Payer: Medicare Other | Admitting: Dermatology

## 2020-10-18 DIAGNOSIS — B351 Tinea unguium: Secondary | ICD-10-CM | POA: Diagnosis not present

## 2020-10-18 DIAGNOSIS — C44319 Basal cell carcinoma of skin of other parts of face: Secondary | ICD-10-CM

## 2020-10-18 DIAGNOSIS — L578 Other skin changes due to chronic exposure to nonionizing radiation: Secondary | ICD-10-CM | POA: Diagnosis not present

## 2020-10-18 MED ORDER — TERBINAFINE HCL 250 MG PO TABS
250.0000 mg | ORAL_TABLET | Freq: Every day | ORAL | 1 refills | Status: DC
Start: 2020-10-18 — End: 2020-11-21

## 2020-10-18 NOTE — Patient Instructions (Signed)
If you have any questions or concerns for your doctor, please call our main line at [phone] and press option 4 to reach your doctor's medical assistant. If no one answers, please leave a voicemail as directed and we will return your call as soon as possible. Messages left after 4 pm will be answered the following business day.   You may also send us a message via MyChart. We typically respond to MyChart messages within 1-2 business days.  For prescription refills, please ask your pharmacy to contact our office. Our fax number is [phone].  If you have an urgent issue when the clinic is closed that cannot wait until the next business day, you can page your doctor at the number below.    Please note that while we do our best to be available for urgent issues outside of office hours, we are not available 24/7.   If you have an urgent issue and are unable to reach us, you may choose to seek medical care at your doctor's office, retail clinic, urgent care center, or emergency room.  If you have a medical emergency, please immediately call 911 or go to the emergency department.  Pager Numbers  - Dr. Kowalski: [phone]  - Dr. Moye: [phone]  - Dr. Stewart: [phone]  In the event of inclement weather, please call our main line at [phone] for an update on the status of any delays or closures.  Dermatology Medication Tips: Please keep the boxes that topical medications come in in order to help keep track of the instructions about where and how to use these. Pharmacies typically print the medication instructions only on the boxes and not directly on the medication tubes.   If your medication is too expensive, please contact our office at [phone] option 4 or send us a message through MyChart.   We are unable to tell what your co-pay for medications will be in advance as this is different depending on your insurance coverage. However, we may be able to find a substitute  medication at lower cost or fill out paperwork to get insurance to cover a needed medication.   If a prior authorization is required to get your medication covered by your insurance company, please allow us 1-2 business days to complete this process.  Drug prices often vary depending on where the prescription is filled and some pharmacies may offer cheaper prices.  The website www.goodrx.com contains coupons for medications through different pharmacies. The prices here do not account for what the cost may be with help from insurance (it may be cheaper with your insurance), but the website can give you the price if you did not use any insurance.  - You can print the associated coupon and take it with your prescription to the pharmacy.  - You may also stop by our office during regular business hours and pick up a GoodRx coupon card.  - If you need your prescription sent electronically to a different pharmacy, notify our office through [place] MyChart or by phone at [phone] option 4.   Wound Care Instructions  Cleanse wound gently with soap and water once a day then pat dry with clean gauze. Apply a thing coat of Petrolatum (petroleum jelly, "Vaseline") over the wound (unless you have an allergy to this). We recommend that you use a new, sterile tube of Vaseline. Do not pick or remove scabs. Do not remove the yellow or white "healing tissue" from the base of the wound.  Cover the   wound with fresh, clean, nonstick gauze and secure with paper tape. You may use Band-Aids in place of gauze and tape if the would is small enough, but would recommend trimming much of the tape off as there is often too much. Sometimes Band-Aids can irritate the skin.  You should call the office for your biopsy report after 1 week if you have not already been contacted.  If you experience any problems, such as abnormal amounts of bleeding, swelling, significant bruising, significant pain, or evidence of infection,  please call the office immediately.  FOR ADULT SURGERY PATIENTS: If you need something for pain relief you may take 1 extra strength Tylenol (acetaminophen) AND 2 Ibuprofen (200mg each) together every 4 hours as needed for pain. (do not take these if you are allergic to them or if you have a reason you should not take them.) Typically, you may only need pain medication for 1 to 3 days.    

## 2020-10-18 NOTE — Progress Notes (Signed)
Follow-Up Visit   Subjective  Thomas [name] is a 87 y.o. male who presents for the following: BCC (Of the R temple - patient is here today for [facility] ) and tinea unguium  (Of the toenails - S/P one month of Lamisil 250mg  po QD).  The following portions of the chart were reviewed this encounter and updated as appropriate:   Tobacco  Allergies  Meds  Problems  Med Hx  Surg Hx  Fam Hx      Review of Systems:  No other skin or systemic complaints except as noted in HPI or Assessment and Plan.  Objective  Well appearing patient in no apparent distress; mood and affect are within normal limits.  A focused examination was performed including the face and feet. Relevant physical exam findings are noted in the Assessment and Plan.  R temple Healing bx site.  B/L foot Toenail dystrophy.   Assessment & Plan  Basal cell carcinoma (BCC) of skin of other part of face R temple - post tx defect 0.8 cm Destruction of lesion Complexity: extensive   Destruction method: electrodesiccation and curettage   Informed consent: discussed and consent obtained   Timeout:  patient name, date of birth, surgical site, and procedure verified Procedure prep:  Patient was prepped and draped in usual sterile fashion Prep type:  Isopropyl alcohol Anesthesia: the lesion was anesthetized in a standard fashion   Anesthetic:  1% lidocaine w/ epinephrine 1-100,000 buffered w/ 8.4% NaHCO3 Curettage performed in three different directions: Yes   Electrodesiccation performed over the curetted area: Yes   Hemostasis achieved with:  pressure, aluminum chloride and electrodesiccation Outcome: patient tolerated procedure well with no complications   Post-procedure details: sterile dressing applied and wound care instructions given   Dressing type: bandage and petrolatum    Tinea unguium B/L foot Chronic and persistent.  Improving with oral Lamisil treatment. Continue Lamisil 250mg  po QD for two more months.  #30 1RF.  He is tolerating medication well. Terbinafine Counseling Terbinafine is an anti-fungal medicine that can be applied to the skin (over the counter) or taken by mouth (prescription) to treat fungal infections. The pill version is often used to treat fungal infections of the nails or scalp. While most people do not have any side effects from taking terbinafine pills, some possible side effects of the medicine can include taste changes, headache, loss of smell, vision changes, nausea, vomiting, or diarrhea.   Rare side effects can include irritation of the liver, allergic reaction, or decrease in blood counts (which may show up as not feeling well or developing an infection). If you are concerned about any of these side effects, please stop the medicine and call your doctor, or in the case of an emergency such as feeling very unwell, seek immediate medical care.   terbinafine (LAMISIL) 250 MG tablet - B/L foot Take 1 tablet (250 mg total) by mouth daily.  Actinic Damage - chronic, secondary to cumulative UV radiation exposure/sun exposure over time - diffuse scaly erythematous macules with underlying dyspigmentation - Recommend daily broad spectrum sunscreen SPF 30+ to sun-exposed areas, reapply every 2 hours as needed.  - Recommend staying in the shade or wearing long sleeves, sun glasses (UVA+UVB protection) and wide brim hats (4-inch brim around the entire circumference of the hat). - Call for new or changing lesions.  Return in about 1 year (around 10/18/2021) for TBSE.  Luther Redo, CMA, am acting as scribe for Sarina Ser, MD .  Documentation:  I have reviewed the above documentation for accuracy and completeness, and I agree with the above.  Sarina Ser, MD

## 2020-10-24 ENCOUNTER — Other Ambulatory Visit: Payer: Self-pay

## 2020-10-24 ENCOUNTER — Ambulatory Visit: Payer: Medicare Other | Admitting: Physical Therapy

## 2020-10-24 DIAGNOSIS — M533 Sacrococcygeal disorders, not elsewhere classified: Secondary | ICD-10-CM | POA: Diagnosis not present

## 2020-10-24 DIAGNOSIS — G8929 Other chronic pain: Secondary | ICD-10-CM | POA: Diagnosis not present

## 2020-10-24 DIAGNOSIS — M545 Low back pain, unspecified: Secondary | ICD-10-CM | POA: Diagnosis not present

## 2020-10-24 DIAGNOSIS — M4125 Other idiopathic scoliosis, thoracolumbar region: Secondary | ICD-10-CM | POA: Diagnosis not present

## 2020-10-24 DIAGNOSIS — R2689 Other abnormalities of gait and mobility: Secondary | ICD-10-CM

## 2020-10-24 DIAGNOSIS — R293 Abnormal posture: Secondary | ICD-10-CM

## 2020-10-24 DIAGNOSIS — N39498 Other specified urinary incontinence: Secondary | ICD-10-CM

## 2020-10-24 NOTE — Patient Instructions (Signed)
One sided push-up seated 30 reps, x 3 x day  Shoulders down  ___   Laying on side with slight rotation onto pillows relaxation 10 min  Both sides   When on back  Horizontal pillow under vertical pillow so the bottom of pillow at mid back

## 2020-10-24 NOTE — Therapy (Addendum)
[place] MAIN [facility] SERVICES [address] [place], [place], [address] Phone: [phone]   Fax:  [phone]  Physical Therapy Treatment  Patient Details  Name: Thomas [name] MRN: [id number] Date of Birth: 1932/11/30 Referring Provider (PT): Dawley and Erlene Quan   Encounter Date: 10/24/2020   PT End of Session - 10/24/20 1637     Visit Number 4    Number of Visits 10    Date for PT Re-Evaluation 11/27/20    PT Start Time 9702    PT Stop Time 1600    PT Time Calculation (min) 57 min    Activity Tolerance Patient tolerated treatment well    Behavior During Therapy [facility] for tasks assessed/performed             Past Medical History:  Diagnosis Date   Aortic valve disorders    Arthritis    Atrial flutter ([place]) 06/18/2016   "AF or AFl; not sure which" (06/23/2016)   Basal cell carcinoma    "face, nose left shoulder, left arm" (06/19/2016)   Basal cell carcinoma 09/13/2020   right temple   BBB (bundle branch block)    hx right   Chronic back pain    "neck, thoracic, lower back" (06/19/2016)   Complete heart block ([place]) 06/2016   Dyspnea    GERD (gastroesophageal reflux disease)    Gout    Heart block    "I've had type I, II Wenke before now" (06/19/2016)   History of gout    History of hiatal hernia    "self dx'd" (06/19/2016)   Hyperlipidemia    Hypertension    Lyme disease    "dx'd by me 2003; cx's showed dx 08/2015"   Migraine    "3-4/year" (06/19/2016)   Presence of permanent cardiac pacemaker 06/19/2016   PVC's (premature ventricular contractions)    Renal cancer, left ([place]) 2006   S/P cryotherapy   Spinal stenosis    "cervical, 1 thoracic, lumbar" (06/19/2016)   Squamous carcinoma    "face, nose left shoulder, left arm" (06/19/2016)   Stroke ([place])    TIA (transient ischemic attack) 06/14/2016   "I'm not sure that's what it was" (06/25/2016)   Visit for monitoring Tikosyn therapy 09/09/2017    Past Surgical History:   Procedure Laterality Date   ANKLE FRACTURE SURGERY Right 1967   BACK SURGERY  05/07/2020   BASAL CELL CARCINOMA EXCISION     "face, nose left shoulder, left arm"   BIOPSY PROSTATE  2001 & 2003   CARDIAC CATHETERIZATION  1990's   CARDIOVERSION N/A 09/11/2017   Procedure: CARDIOVERSION;  Surgeon: Lelon Perla, MD;  [facility];  Service: Cardiovascular;  Laterality: N/A;   FRACTURE SURGERY     HOLEP-LASER ENUCLEATION OF THE PROSTATE WITH MORCELLATION N/A 07/10/2020   Procedure: HOLEP-LASER ENUCLEATION OF THE PROSTATE WITH MORCELLATION;  Surgeon: Hollice Espy, MD;  Location: [facility] ORS;  Service: Urology;  Laterality: N/A;   INGUINAL HERNIA REPAIR Left 2012   INSERT / REPLACE / REMOVE PACEMAKER  06/19/2016   LAPAROSCOPIC ABLATION RENAL MASS     LEFT HEART CATH AND CORONARY ANGIOGRAPHY Left 10/23/2017   Procedure: LEFT HEART CATH AND CORONARY ANGIOGRAPHY;  Surgeon: Wellington Hampshire, MD;  Location: [place] [facility];  Service: Cardiovascular;  Laterality: Left;   PACEMAKER IMPLANT N/A 06/19/2016   Procedure: Pacemaker Implant;  Surgeon: Deboraha Sprang, MD;  Location: [place] [facility];  Service: Cardiovascular;  Laterality: N/A;  pacemasker     PROSTATE SURGERY     SQUAMOUS CELL CARCINOMA EXCISION     "face, nose left shoulder, left arm"   TONSILLECTOMY AND ADENOIDECTOMY      There were no vitals filed for this visit.   Subjective Assessment - 10/24/20 1512     Subjective Pt experiences less leakage across 4 hours across 4 days. When he lies on his back, he exepriences a nerve pain that starts at T1-2 that wraps down by the side of trunk and across the low abdomen across the last few nights.    Pertinent History Lumbar imagining 05/09/19   "L2 compression deformity is again identified. L1 and L3 pedicle  screws are seen with posterior fixation identified. The overall  appearance is stable from the intraoperative exam. Mild degenerative  anterolisthesis of L4 on L5 is seen.  Intraoperative spot films of the lumbar spine demonstrate initially  2 posterior spinal needles marking the T12 and L3 spinous processes."   HOLEP 07/2020    Patient Stated Goals get his back straight and to learn how to deal chronic back ache in teh sacoiliac area and to regain control of his urinary leakage.                [facility] PT Assessment - 10/24/20 1647       Posture/Postural Control   Posture Comments pre Tx 25 cm earlobe to wall      Palpation   Palpation comment tightness at iliocostalis B, interpsinals at cervical spine B, hypomobile T 1-2                           [facility] Adult PT Treatment/Exercise - 10/24/20 1647       Neuro Re-ed    Neuro Re-ed Details  cued for shoulder depression in for one-sided push up for HEP      Manual Therapy   Manual therapy comments STM/MWM at problem areas noted in assessment.                         PT Long Term Goals - 10/24/20 1703       PT LONG TERM GOAL #1   Title Pt will complete a chart detailing the method he uses for continence in relationship with activities across each day of the week in order to gather data for baseline    Time 2    Period Weeks    Status On-going      PT LONG TERM GOAL #2   Title Pt will improve gait mechanics and posture  to increase walking endurance from 20 min to > 30 min in order to walk with less pain    Time 6    Period Weeks    Status On-going      PT LONG TERM GOAL #3   Title Pt will demo less forard head posture from 30 cm from earlobe to wall to < 25 cm in order to achieve more upright posture to optimize IAP system for postural stability ( less LBP) and urinary continence    Time 8    Period Weeks    Status On-going      PT LONG TERM GOAL #4   Title Pt will demo IND with deep core coordination and pelvic floor coordination without compensatory patterns to help with ADLs    Time 4    Period Weeks    Status On-going  PT LONG TERM GOAL #5    Title Pt will demo less L thoracic shift, less convex curve at T/L junction and more reciprocal gait pattern in order to regain structural midline and to progress to pelvic floor contractions with better outcomes    Time 10    Period Weeks    Status On-going      PT LONG TERM GOAL #6   Title Pt will report decreased pain by 50% with Bending over, getting out of bed, reaching up overhead activities in order to perform his hobbies and household chores.    Time 8    Period Weeks    Status On-going                   Plan - 10/24/20 1638     Clinical Impression Statement Pt is demonstrating more upright posture and less forward head but thoracic kyphosis remains an area to work on with and is causing his pain when in supine position. Discussed with pt how to modify with pillows to minimize pain when lying on back and for HEP.  Withheld lower trunk rotation from HEP today due to this pain as pt reported this pain started after he had performed excessive ROM in this exercises at home . Although he was corrected at the following visit, it is still important to hold off on this motion for now due to location of his spinal surgery.  These improvements of a more upright posture is helping his urinary leakage become less as he reported noticing acrossing 4 days where he experienced less leakage across 4 hours.   Pt tolerated manual Tx to that addressed C/T area of mm tightness and interspinal mm tightness B. Pt demo'd increased cervical mobility and lengthened pectoralis mm. Initiated scapular retraction and depression in seated position ( one-sided push up). Pt requried cues for less upper trap overuse. Plan to continue to addressing cervical spine at upcoming sessions.   Plan to add standing glut strengthening with arm movements to promote scapular depression/ retraction.  Pt continues to benefit from skilled PT   Personal Factors and Comorbidities Comorbidity 3+    Comorbidities Lumbar fusion  L1-3    Examination-Activity Limitations Toileting;Lift;Bed Mobility;Locomotion Level;Bend;Sit;Sleep;Continence;Squat;Stand;Transfers;Reach Overhead    Stability/Clinical Decision Making Evolving/Moderate complexity    Rehab Potential Good    PT Frequency 1x / week    PT Duration Other (comment)   10   PT Treatment/Interventions Moist Heat;Therapeutic activities;Therapeutic exercise;Patient/family education;Neuromuscular re-education;Stair training;Gait training;Manual techniques;Taping;Splinting;Dry needling;Balance training;ADLs/Self Care Home Management;Cryotherapy;Traction;Functional mobility training;Energy conservation;Joint Manipulations;Passive range of motion;Scar mobilization    Consulted and Agree with Plan of Care Patient;Family member/caregiver    Family Member Consulted wife             Patient will benefit from skilled therapeutic intervention in order to improve the following deficits and impairments:  Decreased activity tolerance, Decreased endurance, Decreased range of motion, Decreased strength, Decreased coordination, Decreased mobility, Decreased scar mobility, Abnormal gait, Increased fascial restricitons, Impaired sensation, Improper body mechanics, Pain, Hypermobility, Increased muscle spasms, Hypomobility, Difficulty walking, Decreased knowledge of precautions, Decreased balance, Postural dysfunction, Cardiopulmonary status limiting activity, Decreased safety awareness  Visit Diagnosis: Abnormal posture  Other abnormalities of gait and mobility  Other idiopathic scoliosis, thoracolumbar region  Other urinary incontinence  Sacrococcygeal disorders, not elsewhere classified  Chronic low back pain without sciatica, unspecified back pain laterality     Problem List Patient Active Problem List   Diagnosis Date Noted   Lumbar burst fracture ([place])  05/04/2020   Hypogonadism male 10/10/2019   Persistent atrial fibrillation ([place]) 01/12/2019   Acute low back pain  11/04/2017   Abnormal screening cardiac CT    Visit for monitoring Tikosyn therapy 09/09/2017   History of stroke 08/29/2016   Paroxysmal atrial fibrillation ([place]) 07/23/2016   Coronary artery disease 07/23/2016   Snores 06/27/2016   Cardiac pacemaker in situ 06/25/2016   Hemiparesis ([place]) 06/25/2016   Acute ischemic stroke ([place])    Acute left hemiparesis ([facility])    Hemisensory loss    Dysarthria    Stroke-like symptoms    Complete heart block (H[facility])    Pain in thoracic spine    Orthostasis    Lethargy    Occlusion of vertebral artery    TIA (transient ischemic attack) 06/14/2016   Arthritis 02/06/2015   Esophageal reflux 02/06/2015   Arthritis urica 02/06/2015   Cannot sleep 02/06/2015   Arthritis, degenerative 02/06/2015   Adenocarcinoma, renal cell ([place]) 02/06/2015   Benign prostatic hyperplasia with lower urinary tract symptoms 11/21/2014   Personal history of other malignant neoplasm of kidney 11/21/2014   Palpitations 12/31/2013   Hyperlipidemia 11/02/2010   HYPERTENSION, BENIGN 04/16/2010    Jerl Mina ,PT, DPT, E-RYT  10/24/2020, 5:03 PM  [place] MAIN [facility] SERVICES [address] [place], [place], [address] Phone: [phone]   Fax:  [phone]  Name: Thomas [name] MRN: [id number] Date of Birth: 18-Feb-1933

## 2020-10-25 ENCOUNTER — Encounter: Payer: Self-pay | Admitting: Dermatology

## 2020-10-31 ENCOUNTER — Ambulatory Visit (INDEPENDENT_AMBULATORY_CARE_PROVIDER_SITE_OTHER): Payer: Medicare Other

## 2020-10-31 ENCOUNTER — Telehealth: Payer: Self-pay

## 2020-10-31 DIAGNOSIS — I442 Atrioventricular block, complete: Secondary | ICD-10-CM | POA: Diagnosis not present

## 2020-10-31 LAB — CUP PACEART REMOTE DEVICE CHECK
Battery Remaining Longevity: 70 mo
Battery Voltage: 2.97 V
Brady Statistic AP VP Percent: 90.96 %
Brady Statistic AP VS Percent: 0.04 %
Brady Statistic AS VP Percent: 8.95 %
Brady Statistic AS VS Percent: 0.05 %
Brady Statistic RA Percent Paced: 78.77 %
Brady Statistic RV Percent Paced: 99.86 %
Date Time Interrogation Session: 20220627202956
Implantable Lead Implant Date: 20180214
Implantable Lead Implant Date: 20180214
Implantable Lead Location: 753859
Implantable Lead Location: 753860
Implantable Lead Model: 5076
Implantable Lead Model: 5076
Implantable Pulse Generator Implant Date: 20180214
Lead Channel Impedance Value: 304 Ohm
Lead Channel Impedance Value: 304 Ohm
Lead Channel Impedance Value: 380 Ohm
Lead Channel Impedance Value: 399 Ohm
Lead Channel Pacing Threshold Amplitude: 0.5 V
Lead Channel Pacing Threshold Amplitude: 0.875 V
Lead Channel Pacing Threshold Pulse Width: 0.4 ms
Lead Channel Pacing Threshold Pulse Width: 0.4 ms
Lead Channel Sensing Intrinsic Amplitude: 14.125 mV
Lead Channel Sensing Intrinsic Amplitude: 14.125 mV
Lead Channel Sensing Intrinsic Amplitude: 2 mV
Lead Channel Sensing Intrinsic Amplitude: 2 mV
Lead Channel Setting Pacing Amplitude: 2 V
Lead Channel Setting Pacing Amplitude: 2.5 V
Lead Channel Setting Pacing Pulse Width: 0.4 ms
Lead Channel Setting Sensing Sensitivity: 2 mV

## 2020-10-31 NOTE — Telephone Encounter (Signed)
"  Scheduled remote reviewed. Normal device function.AF burden 14%, presenting rhythm AF with V pacing, onset 10/30/20 @ 1900. Longest episode < 48hrs per [facility] trends. Hx of PAF and CHB. [place]- Eliquis, on Dofetilide Routing due to presenting rhythm"  Unsuccessful telephone encounter to Mikael Spray to follow up on [facility] alert received for ongoing AF on remote presentation. Called to assess s/s of AF and compliance with medications. HIPAA compliant VM message left requesting call back to [phone]

## 2020-11-01 DIAGNOSIS — I10 Essential (primary) hypertension: Secondary | ICD-10-CM | POA: Diagnosis not present

## 2020-11-01 DIAGNOSIS — E782 Mixed hyperlipidemia: Secondary | ICD-10-CM | POA: Diagnosis not present

## 2020-11-02 LAB — CBC WITH DIFFERENTIAL/PLATELET
Basophils Absolute: 0 10*3/uL (ref 0.0–0.2)
Basos: 1 %
EOS (ABSOLUTE): 0.1 10*3/uL (ref 0.0–0.4)
Eos: 2 %
Hematocrit: 41.8 % (ref 37.5–51.0)
Hemoglobin: 13.6 g/dL (ref 13.0–17.7)
Immature Grans (Abs): 0 10*3/uL (ref 0.0–0.1)
Immature Granulocytes: 0 %
Lymphocytes Absolute: 1.8 10*3/uL (ref 0.7–3.1)
Lymphs: 32 %
MCH: 28.8 pg (ref 26.6–33.0)
MCHC: 32.5 g/dL (ref 31.5–35.7)
MCV: 88 fL (ref 79–97)
Monocytes Absolute: 0.8 10*3/uL (ref 0.1–0.9)
Monocytes: 14 %
Neutrophils Absolute: 2.9 10*3/uL (ref 1.4–7.0)
Neutrophils: 51 %
Platelets: 229 10*3/uL (ref 150–450)
RBC: 4.73 x10E6/uL (ref 4.14–5.80)
RDW: 13.5 % (ref 11.6–15.4)
WBC: 5.7 10*3/uL (ref 3.4–10.8)

## 2020-11-02 LAB — COMPREHENSIVE METABOLIC PANEL
ALT: 17 IU/L (ref 0–44)
AST: 20 IU/L (ref 0–40)
Albumin/Globulin Ratio: 1.4 (ref 1.2–2.2)
Albumin: 3.8 g/dL (ref 3.6–4.6)
Alkaline Phosphatase: 103 IU/L (ref 44–121)
BUN/Creatinine Ratio: 14 (ref 10–24)
BUN: 20 mg/dL (ref 8–27)
Bilirubin Total: 0.3 mg/dL (ref 0.0–1.2)
CO2: 25 mmol/L (ref 20–29)
Calcium: 9.3 mg/dL (ref 8.6–10.2)
Chloride: 100 mmol/L (ref 96–106)
Creatinine, Ser: 1.39 mg/dL — ABNORMAL HIGH (ref 0.76–1.27)
Globulin, Total: 2.7 g/dL (ref 1.5–4.5)
Glucose: 96 mg/dL (ref 65–99)
Potassium: 4.1 mmol/L (ref 3.5–5.2)
Sodium: 138 mmol/L (ref 134–144)
Total Protein: 6.5 g/dL (ref 6.0–8.5)
eGFR: 49 mL/min/{1.73_m2} — ABNORMAL LOW (ref >59)

## 2020-11-02 LAB — LIPID PANEL
Chol/HDL Ratio: 3 ratio (ref 0.0–5.0)
Cholesterol, Total: 125 mg/dL (ref 100–199)
HDL: 41 mg/dL (ref >39)
LDL Chol Calc (NIH): 70 mg/dL (ref 0–99)
Triglycerides: 69 mg/dL (ref 0–149)
VLDL Cholesterol Cal: 14 mg/dL (ref 5–40)

## 2020-11-02 LAB — TESTOSTERONE: Testosterone: 682 ng/dL (ref 264–916)

## 2020-11-02 LAB — TSH: TSH: 1.46 u[IU]/mL (ref 0.450–4.500)

## 2020-11-03 NOTE — Telephone Encounter (Signed)
Successful telephone encounter to Thomas [name] to follow up on Dr. Aquilla Hacker request to send a manual transmission today to see if patient remains in afib. Patient denies symptoms of AF currently. Attempted to assist patient with sending manual transmission however after multiple unsuccessful attempts to send transmission, patient received error code of 3248. Patient provided [place] number at [phone] and encouraged to call ASAP. He is informed that he will need appointment with Dr. Caryl Comes to discuss antiarrythmic therapy. Will send to scheduling.

## 2020-11-08 NOTE — Telephone Encounter (Signed)
I called and spoke with the patient about adding him on to Dr. Joycelyn Rua schedule for tomorrow (7/7) to discuss antiarrthymic therapy. The patient advised "I have a note here that I should see him in October." I advised I was following up to a call our [facility] team had with him on 11/03/20. Per the patient, he is currently out of town and would like for me to call him back next week to try to arrange an appointment.  I advised I will call him back next week as I will have to find an add on spot for him.  The patient voices understanding and is agreeable.

## 2020-11-13 ENCOUNTER — Ambulatory Visit: Payer: Medicare Other | Admitting: Physical Therapy

## 2020-11-14 ENCOUNTER — Other Ambulatory Visit: Payer: Medicare Other

## 2020-11-14 NOTE — Telephone Encounter (Signed)
Patient spouse calling to see when patient can be worked in for Dr Caryl Comes Please advise and call spouse at [phone]

## 2020-11-14 NOTE — Telephone Encounter (Signed)
I called and spoke with the patient's wife. The patient has been scheduled to see Dr. Caryl Comes on 11/28/20 @ 1:20 pm. Mrs. Rijos is agreeable and voices understanding.

## 2020-11-20 ENCOUNTER — Other Ambulatory Visit: Payer: Self-pay

## 2020-11-20 ENCOUNTER — Ambulatory Visit: Payer: Medicare Other | Attending: Urology | Admitting: Physical Therapy

## 2020-11-20 DIAGNOSIS — R293 Abnormal posture: Secondary | ICD-10-CM | POA: Diagnosis not present

## 2020-11-20 NOTE — Therapy (Signed)
[place] MAIN [facility] SERVICES [address] [place], [place], [address] Phone: [phone]   Fax:  [phone]  Physical Therapy Treatment  Patient Details  Name: Thomas [name] MRN: [id number] Date of Birth: July 10, 1932 Referring Provider (PT): Dawley and Erlene Quan   Encounter Date: 11/20/2020   PT End of Session - 11/20/20 1656     Visit Number 5    Number of Visits 10    Date for PT Re-Evaluation 11/27/20    PT Start Time 1603    PT Stop Time 1700    PT Time Calculation (min) 57 min    Activity Tolerance Patient tolerated treatment well    Behavior During Therapy [facility] for tasks assessed/performed             Past Medical History:  Diagnosis Date   Aortic valve disorders    Arthritis    Atrial flutter ([place]) 06/18/2016   "AF or AFl; not sure which" (06/23/2016)   Basal cell carcinoma    "face, nose left shoulder, left arm" (06/19/2016)   Basal cell carcinoma 09/13/2020   right temple   BBB (bundle branch block)    hx right   Chronic back pain    "neck, thoracic, lower back" (06/19/2016)   Complete heart block ([place]) 06/2016   Dyspnea    GERD (gastroesophageal reflux disease)    Gout    Heart block    "I've had type I, II Wenke before now" (06/19/2016)   History of gout    History of hiatal hernia    "self dx'd" (06/19/2016)   Hyperlipidemia    Hypertension    Lyme disease    "dx'd by me 2003; cx's showed dx 08/2015"   Migraine    "3-4/year" (06/19/2016)   Presence of permanent cardiac pacemaker 06/19/2016   PVC's (premature ventricular contractions)    Renal cancer, left ([place]) 2006   S/P cryotherapy   Spinal stenosis    "cervical, 1 thoracic, lumbar" (06/19/2016)   Squamous carcinoma    "face, nose left shoulder, left arm" (06/19/2016)   Stroke ([place])    TIA (transient ischemic attack) 06/14/2016   "I'm not sure that's what it was" (06/25/2016)   Visit for monitoring Tikosyn therapy 09/09/2017    Past Surgical History:   Procedure Laterality Date   ANKLE FRACTURE SURGERY Right 1967   BACK SURGERY  05/07/2020   BASAL CELL CARCINOMA EXCISION     "face, nose left shoulder, left arm"   BIOPSY PROSTATE  2001 & 2003   CARDIAC CATHETERIZATION  1990's   CARDIOVERSION N/A 09/11/2017   Procedure: CARDIOVERSION;  Surgeon: Lelon Perla, MD;  Location: [facility];  Service: Cardiovascular;  Laterality: N/A;   FRACTURE SURGERY     HOLEP-LASER ENUCLEATION OF THE PROSTATE WITH MORCELLATION N/A 07/10/2020   Procedure: HOLEP-LASER ENUCLEATION OF THE PROSTATE WITH MORCELLATION;  Surgeon: Hollice Espy, MD;  Location: [facility] ORS;  Service: Urology;  Laterality: N/A;   INGUINAL HERNIA REPAIR Left 2012   INSERT / REPLACE / REMOVE PACEMAKER  06/19/2016   LAPAROSCOPIC ABLATION RENAL MASS     LEFT HEART CATH AND CORONARY ANGIOGRAPHY Left 10/23/2017   Procedure: LEFT HEART CATH AND CORONARY ANGIOGRAPHY;  Surgeon: Wellington Hampshire, MD;  Location: [place] CV LAB;  Service: Cardiovascular;  Laterality: Left;   PACEMAKER IMPLANT N/A 06/19/2016   Procedure: Pacemaker Implant;  Surgeon: Deboraha Sprang, MD;  Location: [place] [facility];  Service: Cardiovascular;  Laterality: N/A;  pacemasker     PROSTATE SURGERY     SQUAMOUS CELL CARCINOMA EXCISION     "face, nose left shoulder, left arm"   TONSILLECTOMY AND ADENOIDECTOMY      There were no vitals filed for this visit.   Subjective Assessment - 11/20/20 1608     Subjective Pt reports his sensation to urinate  is not back yet. Pt has the urge to urge but he has to valsalva to urinate 8-10 x when standing. Pt typically pees standing.  Pt is very please with how much improvement he has had with how to hold his neck, back, pelvis    Pertinent History Lumbar imagining 05/09/19   "L2 compression deformity is again identified. L1 and L3 pedicle  screws are seen with posterior fixation identified. The overall  appearance is stable from the intraoperative exam. Mild degenerative   anterolisthesis of L4 on L5 is seen. Intraoperative spot films of the lumbar spine demonstrate initially  2 posterior spinal needles marking the T12 and L3 spinous processes."   HOLEP 07/2020    Patient Stated Goals get his back straight and to learn how to deal chronic back ache in teh sacoiliac area and to regain control of his urinary leakage.                [facility] PT Assessment - 11/20/20 1623       Observation/Other Assessments   Observations slumped sitting , legs crossed      Other:   Other/ Comments backward lunge with poor activation of glut/ alignment      Other:   Other/Comments swimmers in belly with pillows not effective due to thoracic kyphosis      Palpation   Spinal mobility limited SIJ in nutation                           [facility] Adult PT Treatment/Exercise - 11/20/20 1652       Therapeutic Activites    Other Therapeutic Activities sitting posture mechanics, modified swimmers in prone withheld due to thoracic kyphosis,      Neuro Re-ed    Neuro Re-ed Details  cued fro propioception for lower kinetic chain in standing      Manual Therapy   Manual therapy comments rotational mob Grade II SIJ to promote nutation                         PT Long Term Goals - 10/24/20 1703       PT LONG TERM GOAL #1   Title Pt will complete a chart detailing the method he uses for continence in relationship with activities across each day of the week in order to gather data for baseline    Time 2    Period Weeks    Status On-going      PT LONG TERM GOAL #2   Title Pt will improve gait mechanics and posture  to increase walking endurance from 20 min to > 30 min in order to walk with less pain    Time 6    Period Weeks    Status On-going      PT LONG TERM GOAL #3   Title Pt will demo less forard head posture from 30 cm from earlobe to wall to < 25 cm in order to achieve more upright posture to optimize IAP system for postural stability (  less LBP) and urinary continence  Time 8    Period Weeks    Status On-going      PT LONG TERM GOAL #4   Title Pt will demo IND with deep core coordination and pelvic floor coordination without compensatory patterns to help with ADLs    Time 4    Period Weeks    Status On-going      PT LONG TERM GOAL #5   Title Pt will demo less L thoracic shift, less convex curve at T/L junction and more reciprocal gait pattern in order to regain structural midline and to progress to pelvic floor contractions with better outcomes    Time 10    Period Weeks    Status On-going      PT LONG TERM GOAL #6   Title Pt will report decreased pain by 50% with Bending over, getting out of bed, reaching up overhead activities in order to perform his hobbies and household chores.    Time 8    Period Weeks    Status On-going                   Plan - 11/20/20 1647     Clinical Impression Statement Pt demonstrated less forward head posture but thoracic kyphosis and weak gluts are limiting anterior tilt of pelvis. Added functional strengthening exercises  to address this. Pt required cues for propioception for align ment and technique with lower kinetic chain.  Provided manual Tx for SIJmobility. Emphasized sitting posture to minimize thoracic kyphosis. Anticipate pt will improve with urinating without valsalva as pt's posture continues to improve with anterior tilt of pelvis and less thoracic kyphosis. Prone position swimmers exercise has been withheld due to severe thoracic kyphosis. Pt continues to benefit skilled PT.    Personal Factors and Comorbidities Comorbidity 3+    Comorbidities Lumbar fusion L1-3    Examination-Activity Limitations Toileting;Lift;Bed Mobility;Locomotion Level;Bend;Sit;Sleep;Continence;Squat;Stand;Transfers;Reach Overhead    Stability/Clinical Decision Making Evolving/Moderate complexity    Rehab Potential Good    PT Frequency 1x / week    PT Duration Other (comment)   10   PT  Treatment/Interventions Moist Heat;Therapeutic activities;Therapeutic exercise;Patient/family education;Neuromuscular re-education;Stair training;Gait training;Manual techniques;Taping;Splinting;Dry needling;Balance training;ADLs/Self Care Home Management;Cryotherapy;Traction;Functional mobility training;Energy conservation;Joint Manipulations;Passive range of motion;Scar mobilization    Consulted and Agree with Plan of Care Patient;Family member/caregiver    Family Member Consulted wife             Patient will benefit from skilled therapeutic intervention in order to improve the following deficits and impairments:  Decreased activity tolerance, Decreased endurance, Decreased range of motion, Decreased strength, Decreased coordination, Decreased mobility, Decreased scar mobility, Abnormal gait, Increased fascial restricitons, Impaired sensation, Improper body mechanics, Pain, Hypermobility, Increased muscle spasms, Hypomobility, Difficulty walking, Decreased knowledge of precautions, Decreased balance, Postural dysfunction, Cardiopulmonary status limiting activity, Decreased safety awareness  Visit Diagnosis: Abnormal posture     Problem List Patient Active Problem List   Diagnosis Date Noted   Lumbar burst fracture ([place]) 05/04/2020   Hypogonadism male 10/10/2019   Persistent atrial fibrillation ([place]) 01/12/2019   Acute low back pain 11/04/2017   Abnormal screening cardiac CT    Visit for monitoring Tikosyn therapy 09/09/2017   History of stroke 08/29/2016   Paroxysmal atrial fibrillation ([place]) 07/23/2016   Coronary artery disease 07/23/2016   Snores 06/27/2016   Cardiac pacemaker in situ 06/25/2016   Hemiparesis ([place]) 06/25/2016   Acute ischemic stroke ([place])    Acute left hemiparesis (HCC)    Hemisensory loss    Dysarthria  Stroke-like symptoms    Complete heart block ([facility])    Pain in thoracic spine    Orthostasis    Lethargy    Occlusion of vertebral artery    TIA  (transient ischemic attack) 06/14/2016   Arthritis 02/06/2015   Esophageal reflux 02/06/2015   Arthritis urica 02/06/2015   Cannot sleep 02/06/2015   Arthritis, degenerative 02/06/2015   Adenocarcinoma, renal cell ([place]) 02/06/2015   Benign prostatic hyperplasia with lower urinary tract symptoms 11/21/2014   Personal history of other malignant neoplasm of kidney 11/21/2014   Palpitations 12/31/2013   Hyperlipidemia 11/02/2010   HYPERTENSION, BENIGN 04/16/2010    Jerl Mina ,PT, DPT, E-RYT  11/20/2020, 4:57 PM  [place] MAIN [facility] SERVICES [address] [place], [place], [address] Phone: [phone]   Fax:  [phone]  Name: Thomas [name] MRN: [id number] Date of Birth: 04/29/1933

## 2020-11-20 NOTE — Patient Instructions (Addendum)
   3- foot tap  20 reps  Each side   Hold onto wall   Slightly bend of standing knee, and keep hips above foot   ballmound of opposite leg   taps to each direction and   back to spot under hips- notice equal pressure through both legs, and across ballmound and heels    ___ Step backs 30 reps  Blue band over hand  Face away from door  Elbows by side Step back with ballmounds , do not lower heels  Feet hip width apart  Front knee above ankle   30 reps

## 2020-11-21 ENCOUNTER — Ambulatory Visit (INDEPENDENT_AMBULATORY_CARE_PROVIDER_SITE_OTHER): Payer: Medicare Other | Admitting: Urology

## 2020-11-21 ENCOUNTER — Telehealth: Payer: Self-pay | Admitting: Family Medicine

## 2020-11-21 ENCOUNTER — Encounter: Payer: Self-pay | Admitting: Internal Medicine

## 2020-11-21 ENCOUNTER — Ambulatory Visit (INDEPENDENT_AMBULATORY_CARE_PROVIDER_SITE_OTHER): Payer: Medicare Other | Admitting: Internal Medicine

## 2020-11-21 ENCOUNTER — Encounter: Payer: Self-pay | Admitting: Urology

## 2020-11-21 VITALS — BP 142/84 | HR 88 | Ht 67.0 in | Wt 154.0 lb

## 2020-11-21 VITALS — BP 130/72 | HR 62 | Ht 67.0 in | Wt 153.0 lb

## 2020-11-21 DIAGNOSIS — R3914 Feeling of incomplete bladder emptying: Secondary | ICD-10-CM | POA: Diagnosis not present

## 2020-11-21 DIAGNOSIS — I480 Paroxysmal atrial fibrillation: Secondary | ICD-10-CM

## 2020-11-21 DIAGNOSIS — I442 Atrioventricular block, complete: Secondary | ICD-10-CM

## 2020-11-21 DIAGNOSIS — N401 Enlarged prostate with lower urinary tract symptoms: Secondary | ICD-10-CM | POA: Diagnosis not present

## 2020-11-21 DIAGNOSIS — R339 Retention of urine, unspecified: Secondary | ICD-10-CM | POA: Diagnosis not present

## 2020-11-21 DIAGNOSIS — Z79899 Other long term (current) drug therapy: Secondary | ICD-10-CM | POA: Diagnosis not present

## 2020-11-21 DIAGNOSIS — N3000 Acute cystitis without hematuria: Secondary | ICD-10-CM

## 2020-11-21 LAB — URINALYSIS, COMPLETE
Bilirubin, UA: NEGATIVE
Glucose, UA: NEGATIVE
Ketones, UA: NEGATIVE
Nitrite, UA: POSITIVE — AB
Specific Gravity, UA: 1.015 (ref 1.005–1.030)
Urobilinogen, Ur: 0.2 mg/dL (ref 0.2–1.0)
pH, UA: 6 (ref 5.0–7.5)

## 2020-11-21 LAB — MICROSCOPIC EXAMINATION
RBC, Urine: NONE SEEN /hpf (ref 0–2)
WBC, UA: >30 /hpf — ABNORMAL HIGH (ref 0–5)

## 2020-11-21 LAB — BLADDER SCAN AMB NON-IMAGING: Scan Result: 160

## 2020-11-21 MED ORDER — CEPHALEXIN 500 MG PO CAPS: 500.0000 mg | ORAL_CAPSULE | Freq: Three times a day (TID) | ORAL | 0 refills | Status: DC

## 2020-11-21 MED ORDER — SULFAMETHOXAZOLE-TRIMETHOPRIM 800-160 MG PO TABS: 1.0000 | ORAL_TABLET | Freq: Two times a day (BID) | ORAL | 0 refills | Status: DC

## 2020-11-21 NOTE — Patient Instructions (Addendum)
Medication Instructions:  Your physician recommends that you continue on your current medications as directed. Please refer to the Current Medication list given to you today.  *If you need a refill on your cardiac medications before your next appointment, please call your pharmacy*   Lab Work: BMET and Mg today  If you have labs (blood work) drawn today and your tests are completely normal, you will receive your results only by: [place] (if you have MyChart) OR A paper copy in the mail If you have any lab test that is abnormal or we need to change your treatment, we will call you to review the results.   Testing/Procedures: None ordered.    Follow-Up: At [facility], you and your health needs are our priority.  As part of our continuing mission to provide you with exceptional heart care, we have created designated Provider Care Teams.  These Care Teams include your primary Cardiologist (physician) and Advanced Practice Providers (APPs -  Physician Assistants and Nurse Practitioners) who all work together to provide you with the care you need, when you need it.  We recommend signing up for the patient portal called "MyChart".  Sign up information is provided on this After Visit Summary.  MyChart is used to connect with patients for Virtual Visits (Telemedicine).  Patients are able to view lab/test results, encounter notes, upcoming appointments, etc.  Non-urgent messages can be sent to your provider as well.   To learn more about what you can do with MyChart, go to [email or web address].    Your next appointment:   6 month(s)  The format for your next appointment:   In Person  Provider:   Virl Axe, MD

## 2020-11-21 NOTE — Telephone Encounter (Signed)
[facility] called stating the Bactrim Ds has a major contraindication with one of his other medications. I informed her that the Bactrim has been discontinued and Keflex was sent in. She found the other ABX and will fill Keflex.

## 2020-11-21 NOTE — Progress Notes (Signed)
11/21/2020 3:13 PM   Thomas [name] [birth year] [id number]  Referring provider: Jerrol Banana., MD [address] [place],  [place] [address]  Chief Complaint  Patient presents with   Benign Prostatic Hypertrophy    HPI: 87 year old male with a personal history of BPH/urinary retention status post holep in 07/2020 who returns today sooner than expected for worsening urinary symptoms.  He reports that about a month ago, he was started to see some significant improvement and able to control his urinary stream.  He is working with physical therapy.  Over the last several weeks however, he had a setback.  He feels like he has to strain and push to empty his bladder.  This is new.  He also feels like he probably has a UTI with some urgency frequency type symptoms and cloudy urine.  He did pass a small amount of debris/necrotic tissue a few days ago.  No fevers or chills.  He is traveling to the beach with his wife on Sunday for a week.  Urinalysis today is grossly positive  PVR does indicate some incomplete emptying, 160 cc.   PMH: Past Medical History:  Diagnosis Date   Aortic valve disorders    Arthritis    Atrial flutter ([facility]) 06/18/2016   "AF or AFl; not sure which" (06/23/2016)   Basal cell carcinoma    "face, nose left shoulder, left arm" (06/19/2016)   Basal cell carcinoma 09/13/2020   right temple   BBB (bundle branch block)    hx right   Chronic back pain    "neck, thoracic, lower back" (06/19/2016)   Complete heart block ([facility]) 06/2016   Dyspnea    GERD (gastroesophageal reflux disease)    Gout    Heart block    "I've had type I, II Wen[place] before now" (06/19/2016)   History of gout    History of hiatal hernia    "self dx'd" (06/19/2016)   Hyperlipidemia    Hypertension    Lyme disease    "dx'd by me 2003; cx's showed dx 08/2015"   Migraine    "3-4/year" (06/19/2016)   Presence of permanent cardiac pacemaker 06/19/2016   PVC's (premature  ventricular contractions)    Renal cancer, left ([facility]) 2006   S/P cryotherapy   Spinal stenosis    "cervical, 1 thoracic, lumbar" (06/19/2016)   Squamous carcinoma    "face, nose left shoulder, left arm" (06/19/2016)   Stroke ([facility])    TIA (transient ischemic attack) 06/14/2016   "I'm not sure that's what it was" (06/25/2016)   Visit for monitoring Tikosyn therapy 09/09/2017    Surgical History: Past Surgical History:  Procedure Laterality Date   ANKLE FRACTURE SURGERY Right 1967   BACK SURGERY  05/07/2020   BASAL CELL CARCINOMA EXCISION     "face, nose left shoulder, left arm"   BIOPSY PROSTATE  2001 & 2003   CARDIAC CATHETERIZATION  1990's   CARDIOVERSION N/A 09/11/2017   Procedure: CARDIOVERSION;  Surgeon: Lelon Perla, MD;  Location: [facility];  Service: Cardiovascular;  Laterality: N/A;   FRACTURE SURGERY     HOLEP-LASER ENUCLEATION OF THE PROSTATE WITH MORCELLATION N/A 07/10/2020   Procedure: HOLEP-LASER ENUCLEATION OF THE PROSTATE WITH MORCELLATION;  Surgeon: Hollice Espy, MD;  Location: [facility];  Service: Urology;  Laterality: N/A;   INGUINAL HERNIA REPAIR Left 2012   INSERT / REPLACE / REMOVE PACEMAKER  06/19/2016   LAPAROSCOPIC ABLATION RENAL MASS     LEFT HEART CATH  AND CORONARY ANGIOGRAPHY Left 10/23/2017   Procedure: LEFT HEART CATH AND CORONARY ANGIOGRAPHY;  Surgeon: Wellington Hampshire, MD;  Location: [facility];  Service: Cardiovascular;  Laterality: Left;   PACEMAKER IMPLANT N/A 06/19/2016   Procedure: Pacemaker Implant;  Surgeon: Deboraha Sprang, MD;  Location: [facility];  Service: Cardiovascular;  Laterality: N/A;   pacemasker     PROSTATE SURGERY     SQUAMOUS CELL CARCINOMA EXCISION     "face, nose left shoulder, left arm"   TONSILLECTOMY AND ADENOIDECTOMY      Home Medications:  Allergies as of 11/21/2020       Reactions   Other    Vicryl sutures - patient states that site becomes "soupy"    Iodine Hives, Other (See Comments)   IVP  contrast   Penicillins Itching, Rash, Other (See Comments)   ITCHY FEELING IN FINGERS Has patient had a PCN reaction causing immediate rash, facial/tongue/throat swelling, SOB or lightheadedness with hypotension: No Has patient had a PCN reaction causing severe rash involving mucus membranes or skin necrosis: No Has patient had a PCN reaction that required hospitalization No Has patient had a PCN reaction occurring within the last 10 years: No If all of the above answers are "NO", then may proceed with Cephalosporin use.        Medication List        Accurate as of November 21, 2020  3:13 PM. If you have any questions, ask your nurse or doctor.          2-3CC SYRINGE 3 ML Misc 1 mg by Does not apply route every 14 (fourteen) days.   amLODipine 5 MG tablet Commonly known as: NORVASC TAKE 1 TABLET BY MOUTH DAILY   apixaban 5 MG Tabs tablet Commonly known as: Eliquis Take 1 tablet (5 mg total) by mouth 2 (two) times daily.   atorvastatin 40 MG tablet Commonly known as: LIPITOR Take 1 tablet (40 mg total) by mouth daily.   BD Disp Needles 18G X 1-1/2" Misc Generic drug: NEEDLE (DISP) 18 G 1 mg by Does not apply route every 14 (fourteen) days.   BD Disp Needles 21G X 1-1/2" Misc Generic drug: NEEDLE (DISP) 21 G 1 mg by Does not apply route every 14 (fourteen) days.   calcium carbonate 500 MG chewable tablet Commonly known as: TUMS - dosed in mg elemental calcium Chew 1-2 tablets by mouth daily as needed for indigestion.   CoQ10 100 MG Caps Take 100 mg by mouth daily.   dofetilide 125 MCG capsule Commonly known as: TIKOSYN Take 1 capsule (125 mcg total) by mouth 2 (two) times daily.   febuxostat 40 MG tablet Commonly known as: ULORIC Take 1 tablet (40 mg total) by mouth daily. What changed: Another medication with the same name was removed. Continue taking this medication, and follow the directions you see here. Changed by: Virl Axe, MD   gabapentin 300 MG  capsule Commonly known as: NEURONTIN Take 300 mg by mouth 2 (two) times daily.   Magnesium 500 MG Tabs Take 500 mg by mouth at bedtime.   methocarbamol 750 MG tablet Commonly known as: ROBAXIN Take 750 mg by mouth 2 times daily at 12 noon and 4 pm.   metoprolol succinate 50 MG 24 hr tablet Commonly known as: TOPROL-XL TAKE 1 TABLET BY MOUTH DAILY WITH OR IMMEDIATELY FOLLOWING A MEAL   metroNIDAZOLE 500 MG tablet Commonly known as: FLAGYL TAKE 1 TABLET BY MOUTH TWICE DAILY  ON SUNDAY AND MONDAY   mirabegron ER 25 MG Tb24 tablet Commonly known as: MYRBETRIQ Take 1 tablet (25 mg total) by mouth daily.   oxyCODONE 5 MG immediate release tablet Commonly known as: Oxy IR/ROXICODONE Take 1 tablet (5 mg total) by mouth every 6 (six) hours as needed for moderate pain or breakthrough pain.   oxyCODONE-acetaminophen 7.5-325 MG tablet Commonly known as: PERCOCET Take 1-2 tablets by mouth every 4 (four) hours as needed for severe pain.   polyethylene glycol 17 g packet Commonly known as: MIRALAX / GLYCOLAX Take 17 g by mouth as needed.   PRESERVISION/LUTEIN PO Take 1 capsule by mouth in the morning.   propranolol 40 MG tablet Commonly known as: INDERAL TAKE ONE (1) TABLET THREE (3) TIMES EACHDAY AS NEEDED   senna 8.6 MG tablet Commonly known as: SENOKOT Take 1 tablet by mouth 2 (two) times daily.   sulfamethoxazole-trimethoprim 800-160 MG tablet Commonly known as: BACTRIM DS Take 1 tablet by mouth 2 (two) times daily. Started by: Hollice Espy, MD   tamsulosin 0.4 MG Caps capsule Commonly known as: FLOMAX Take 0.4 mg by mouth daily.   terbinafine 250 MG tablet Commonly known as: LamISIL Take 1 tablet (250 mg total) by mouth daily. What changed: Another medication with the same name was removed. Continue taking this medication, and follow the directions you see here. Changed by: Virl Axe, MD   Testosterone 20.25 MG/ACT (1.62%) Gel SMARTSIG:40.5 Milligram(s) Topical  Every Morning   testosterone cypionate 200 MG/ML injection Commonly known as: DEPOTESTOSTERONE CYPIONATE Inject 1 mL (200 mg total) into the muscle every 28 (twenty-eight) days.   traMADol 50 MG tablet Commonly known as: ULTRAM Take 50-100 mg by mouth 4 (four) times daily as needed. What changed: Another medication with the same name was removed. Continue taking this medication, and follow the directions you see here. Changed by: Virl Axe, MD   Turmeric 500 MG Caps Take 500 mg by mouth daily.   VITAMIN B-12 PO Take 3,000 mcg by mouth daily with breakfast.   Vitamin D3 125 MCG (5000 UT) Caps Take 5,000 Units by mouth daily.        Allergies:  Allergies  Allergen Reactions   Other     Vicryl sutures - patient states that site becomes "soupy"    Iodine Hives and Other (See Comments)    IVP contrast   Penicillins Itching, Rash and Other (See Comments)    ITCHY FEELING IN FINGERS Has patient had a PCN reaction causing immediate rash, facial/tongue/throat swelling, SOB or lightheadedness with hypotension: No Has patient had a PCN reaction causing severe rash involving mucus membranes or skin necrosis: No Has patient had a PCN reaction that required hospitalization No Has patient had a PCN reaction occurring within the last 10 years: No If all of the above answers are "NO", then may proceed with Cephalosporin use.     Family History: Family History  Problem Relation Age of Onset   Heart attack Brother    Stroke Brother    Aortic stenosis Mother    Arthritis Father     Social History:  reports that he has never smoked. He has never used smokeless tobacco. He reports current alcohol use of about 1.0 standard drink of alcohol per week. He reports that he does not use drugs.   Physical Exam: BP (!) 142/84   Pulse 88   Ht 5\' 7"  (1.702 m)   Wt 154 lb (69.9 kg)   BMI 24.12 kg/m  Constitutional:  Alert and oriented, No acute distress. HEENT: [place] AT, moist mucus  membranes.  Trachea midline, no masses. Cardiovascular: No clubbing, cyanosis, or edema. Respiratory: Normal respiratory effort, no increased work of breathing. Skin: No rashes, bruises or suspicious lesions. Neurologic: Grossly intact, no focal deficits, moving all 4 extremities. Psychiatric: Normal mood and affect.  Laboratory Data: Lab Results  Component Value Date   WBC 5.7 11/01/2020   HGB 13.6 11/01/2020   HCT 41.8 11/01/2020   MCV 88 11/01/2020   PLT 229 11/01/2020    Lab Results  Component Value Date   CREATININE 1.39 (H) 11/01/2020    Lab Results  Component Value Date   PSA 5.8 08/22/2014    Lab Results  Component Value Date   TESTOSTERONE 682 11/01/2020    Lab Results  Component Value Date   HGBA1C 5.6 07/11/2016    Urinalysis Results for orders placed or performed in visit on 11/21/20  CULTURE, URINE COMPREHENSIVE   Specimen: Urine   UR  Result Value Ref Range   Urine Culture, Comprehensive Final report (A)    Organism ID, Bacteria Klebsiella oxytoca (A)    Organism ID, Bacteria Not applicable    ANTIMICROBIAL SUSCEPTIBILITY Comment   Microscopic Examination   Urine  Result Value Ref Range   WBC, UA >30 (H) 0 - 5 /hpf   RBC None seen 0 - 2 /hpf   Epithelial Cells (non renal) 0-10 0 - 10 /hpf   Bacteria, UA Many (A) None seen/Few  Urinalysis, Complete  Result Value Ref Range   Specific Gravity, UA 1.015 1.005 - 1.030   pH, UA 6.0 5.0 - 7.5   Color, UA Yellow Yellow   Appearance Ur Cloudy (A) Clear   Leukocytes,UA 3+ (A) Negative   Protein,UA 2+ (A) Negative/Trace   Glucose, UA Negative Negative   Ketones, UA Negative Negative   RBC, UA 2+ (A) Negative   Bilirubin, UA Negative Negative   Urobilinogen, Ur 0.2 0.2 - 1.0 mg/dL   Nitrite, UA Positive (A) Negative   Microscopic Examination See below:   BLADDER SCAN AMB NON-IMAGING  Result Value Ref Range   Scan Result 160      Assessment & Plan:    1. Acute cystitis without  hematuria Suspect exacerbation urinary symptoms likely secondary to underlying infection  Urine culture today, we will go ahead and treat with Keflex x 10 days given that he is on Tikosyn- adjust based on culture and sensitivity data - Urinalysis, Complete - BLADDER SCAN AMB NON-IMAGING - CULTURE, URINE COMPREHENSIVE  2. Benign prostatic hyperplasia with incomplete bladder emptying The discussion today about possible underlying issues such as urethral stricture which may be contributing to his difficulty urinating, residual debris in his bladder, etc.  I recommended cystoscopy to evaluate his anatomy to which he is agreeable  We will plan to perform this when he returns from his vacation, he was given supplies to self cath as needed if he feels like he cannot empty   3. Stress urinary incontinence Slowly improving, continue pelvic floor PT  Cysto  Hollice Espy, MD  [facility] [address], [place], [place] [address] [phone]

## 2020-11-21 NOTE — Progress Notes (Signed)
Remote pacemaker transmission.   
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
## 2020-11-22 LAB — BASIC METABOLIC PANEL
BUN/Creatinine Ratio: 19 (ref 10–24)
BUN: 29 mg/dL — ABNORMAL HIGH (ref 8–27)
CO2: 24 mmol/L (ref 20–29)
Calcium: 9.7 mg/dL (ref 8.6–10.2)
Chloride: 101 mmol/L (ref 96–106)
Creatinine, Ser: 1.55 mg/dL — ABNORMAL HIGH (ref 0.76–1.27)
Glucose: 91 mg/dL (ref 65–99)
Potassium: 5.2 mmol/L (ref 3.5–5.2)
Sodium: 138 mmol/L (ref 134–144)
eGFR: 43 mL/min/{1.73_m2} — ABNORMAL LOW (ref >59)

## 2020-11-22 LAB — MAGNESIUM: Magnesium: 2.3 mg/dL (ref 1.6–2.3)

## 2020-11-24 ENCOUNTER — Other Ambulatory Visit: Payer: Self-pay | Admitting: Urology

## 2020-11-24 LAB — CULTURE, URINE COMPREHENSIVE

## 2020-11-24 MED ORDER — CIPROFLOXACIN HCL 500 MG PO TABS: 500.0000 mg | ORAL_TABLET | Freq: Two times a day (BID) | ORAL | 0 refills | Status: DC

## 2020-11-27 ENCOUNTER — Ambulatory Visit: Payer: Medicare Other | Admitting: Physical Therapy

## 2020-11-28 ENCOUNTER — Encounter: Payer: Medicare Other | Admitting: Internal Medicine

## 2020-12-01 ENCOUNTER — Telehealth: Payer: Self-pay

## 2020-12-01 NOTE — Telephone Encounter (Signed)
Janan Ridge, [place]  12/01/2020  2:03 PM EDT Back to Top     Attempted to contact patient to review lab results.  No answer. LMOV to call back.  Phone note started.    Thora Lance, RN  12/01/2020  7:47 AM EDT      Results released to [place], MD  11/30/2020  4:28 PM EDT       Please inform patient that drug surveillance labs are normal    Thora Lance, RN  11/22/2020  1:12 PM EDT      Preliminarily reviewed and sent to MD for review and sign.       Patient Communication   Edit Comments   Add Notifications  Back to Top    Good Morning,   Dr Caryl Comes has reviewed your labs and they are normal.  ...

## 2020-12-03 NOTE — Progress Notes (Signed)
   12/05/2020  CC:  Chief Complaint  Patient presents with   Cysto    HPI: Thomas [name] is a 87 y.o. male with a personal history of BPH/urinary retention status post holep 07/2020 he presents today for a cystoscopy.  Please see previous notes for details.  Urinalysis performed 11/21/2020 was grossly positive.   Urine culture performed 11/21/2020  showed positive for Klebsiella oxytoca, antibiotic was changed to Cipro.   PVR on 11/21/2020 indicated some incomplete emptying, 160 cc.   He reports today that he is doing well and that the Cipro worked well. He has experienced better urination.  He continues to need a Valsalva.  Occasionally his stream is normal but is weak for the most part.  Vitals:   12/05/20 0838  BP: (!) 142/84  Pulse: 88   NED. A&Ox3.   No respiratory distress   Abd soft, NT, ND Normal phallus with bilateral descended testicles  Cystoscopy Procedure Note  Patient identification was confirmed, informed consent was obtained, and patient was prepped using Betadine solution.  Lidocaine jelly was administered per urethral meatus.     Pre-Procedure: - Inspection reveals a normal caliber ureteral meatus.  Procedure: The flexible cystoscope was introduced without difficulty - No urethral strictures/lesions are present.  -- Open/ weak sphincter  - Widely patent bladder neck -Wide HOLEP in his prostate some of which has remucosalized   1 single BPH nodules in anterior aspect of the channel which appears mildly obstructed but the overall bladder neck is open  - Bilateral ureteral orifices identified - Bladder mucosa  reveals no ulcers, tumors, or lesions but with some diffuse mild inflammatory changes - No bladder stones - Mild trabeculation  Retroflexion shows HOLEP defect    Post-Procedure: - Patient tolerated the procedure well   Assessment/ Plan:  BPH with outlet obstruction  - Mild voiding independently   Bladder diverticulum - not  appreciated today   4. Weak stream  - likely a neurogenic component  - self cath as needed for incomplete emptying   5. History of cystitis  - status post recent antibiotics, no evidence of retained debris, likely due to prolonged catheterization, symptoms improved   Follow- up as previously scheduled   I,Kailey Littlejohn,acting as a scribe for Hollice Espy, MD.,have documented all relevant documentation on the behalf of Hollice Espy, MD,as directed by  Hollice Espy, MD while in the presence of Hollice Espy, MD.  Hollice Espy, MD

## 2020-12-05 ENCOUNTER — Ambulatory Visit: Payer: Medicare Other | Admitting: Urology

## 2020-12-05 ENCOUNTER — Ambulatory Visit: Payer: Medicare Other | Admitting: Physical Therapy

## 2020-12-05 ENCOUNTER — Encounter: Payer: Self-pay | Admitting: Urology

## 2020-12-05 ENCOUNTER — Other Ambulatory Visit: Payer: Self-pay

## 2020-12-05 VITALS — BP 142/84 | HR 88 | Ht 67.0 in | Wt 154.0 lb

## 2020-12-05 DIAGNOSIS — N3000 Acute cystitis without hematuria: Secondary | ICD-10-CM

## 2020-12-05 LAB — URINALYSIS, COMPLETE
Bilirubin, UA: NEGATIVE
Glucose, UA: NEGATIVE
Ketones, UA: NEGATIVE
Nitrite, UA: NEGATIVE
Protein,UA: NEGATIVE
Specific Gravity, UA: 1.02 (ref 1.005–1.030)
Urobilinogen, Ur: 0.2 mg/dL (ref 0.2–1.0)
pH, UA: 6.5 (ref 5.0–7.5)

## 2020-12-05 LAB — MICROSCOPIC EXAMINATION: WBC, UA: >30 /hpf — AB (ref 0–5)

## 2020-12-12 ENCOUNTER — Encounter: Payer: Medicare Other | Admitting: Physical Therapy

## 2020-12-13 ENCOUNTER — Other Ambulatory Visit: Payer: Self-pay

## 2020-12-13 ENCOUNTER — Encounter: Payer: Self-pay | Admitting: Family Medicine

## 2020-12-13 MED ORDER — APIXABAN 5 MG PO TABS: 5.0000 mg | ORAL_TABLET | Freq: Two times a day (BID) | ORAL | 0 refills | Status: DC

## 2020-12-13 NOTE — Telephone Encounter (Signed)
Please send in 10 days of his eliquis to pharmacy

## 2020-12-19 ENCOUNTER — Ambulatory Visit: Payer: Medicare Other | Attending: Urology | Admitting: Physical Therapy

## 2020-12-19 ENCOUNTER — Other Ambulatory Visit: Payer: Self-pay

## 2020-12-19 DIAGNOSIS — M4125 Other idiopathic scoliosis, thoracolumbar region: Secondary | ICD-10-CM | POA: Diagnosis not present

## 2020-12-19 DIAGNOSIS — R2689 Other abnormalities of gait and mobility: Secondary | ICD-10-CM

## 2020-12-19 DIAGNOSIS — M545 Low back pain, unspecified: Secondary | ICD-10-CM | POA: Diagnosis not present

## 2020-12-19 DIAGNOSIS — G8929 Other chronic pain: Secondary | ICD-10-CM | POA: Diagnosis not present

## 2020-12-19 DIAGNOSIS — R293 Abnormal posture: Secondary | ICD-10-CM | POA: Diagnosis not present

## 2020-12-19 DIAGNOSIS — M533 Sacrococcygeal disorders, not elsewhere classified: Secondary | ICD-10-CM

## 2020-12-19 DIAGNOSIS — N39498 Other specified urinary incontinence: Secondary | ICD-10-CM

## 2020-12-19 NOTE — Therapy (Signed)
[place] MAIN [facility] SERVICES [address] [place], [place], [address] Phone: [phone]   Fax:  [phone]  Physical Therapy Treatment  Patient Details  Name: Thomas [name] MRN: [id number] Date of Birth: [birth year] Referring Provider (PT): Dawley and Erlene Quan   Encounter Date: 12/19/2020   PT End of Session - 12/19/20 1553     Visit Number 6    Number of Visits 10    Date for PT Re-Evaluation 02/27/21    PT Start Time 1502    PT Stop Time 1600    PT Time Calculation (min) 58 min    Activity Tolerance Patient tolerated treatment well    Behavior During Therapy [facility] for tasks assessed/performed             Past Medical History:  Diagnosis Date   Aortic valve disorders    Arthritis    Atrial flutter ([place]) 06/18/2016   "AF or AFl; not sure which" (06/23/2016)   Basal cell carcinoma    "face, nose left shoulder, left arm" (06/19/2016)   Basal cell carcinoma 09/13/2020   right temple   BBB (bundle branch block)    hx right   Chronic back pain    "neck, thoracic, lower back" (06/19/2016)   Complete heart block ([place]) 06/2016   Dyspnea    GERD (gastroesophageal reflux disease)    Gout    Heart block    "I've had type I, II Wenke before now" (06/19/2016)   History of gout    History of hiatal hernia    "self dx'd" (06/19/2016)   Hyperlipidemia    Hypertension    Lyme disease    "dx'd by me 2003; cx's showed dx 08/2015"   Migraine    "3-4/year" (06/19/2016)   Presence of permanent cardiac pacemaker 06/19/2016   PVC's (premature ventricular contractions)    Renal cancer, left ([place]) 2006   S/P cryotherapy   Spinal stenosis    "cervical, 1 thoracic, lumbar" (06/19/2016)   Squamous carcinoma    "face, nose left shoulder, left arm" (06/19/2016)   Stroke ([place])    TIA (transient ischemic attack) 06/14/2016   "I'm not sure that's what it was" (06/25/2016)   Visit for monitoring Tikosyn therapy 09/09/2017    Past Surgical History:   Procedure Laterality Date   ANKLE FRACTURE SURGERY Right 1967   BACK SURGERY  05/07/2020   BASAL CELL CARCINOMA EXCISION     "face, nose left shoulder, left arm"   BIOPSY PROSTATE  2001 & 2003   CARDIAC CATHETERIZATION  1990's   CARDIOVERSION N/A 09/11/2017   Procedure: CARDIOVERSION;  Surgeon: Lelon Perla, MD;  [facility];  Service: Cardiovascular;  Laterality: N/A;   FRACTURE SURGERY     HOLEP-LASER ENUCLEATION OF THE PROSTATE WITH MORCELLATION N/A 07/10/2020   Procedure: HOLEP-LASER ENUCLEATION OF THE PROSTATE WITH MORCELLATION;  Surgeon: Hollice Espy, MD;  Location: [facility] ORS;  Service: Urology;  Laterality: N/A;   INGUINAL HERNIA REPAIR Left 2012   INSERT / REPLACE / REMOVE PACEMAKER  06/19/2016   LAPAROSCOPIC ABLATION RENAL MASS     LEFT HEART CATH AND CORONARY ANGIOGRAPHY Left 10/23/2017   Procedure: LEFT HEART CATH AND CORONARY ANGIOGRAPHY;  Surgeon: Wellington Hampshire, MD;  Location: [place] CV LAB;  Service: Cardiovascular;  Laterality: Left;   PACEMAKER IMPLANT N/A 06/19/2016   Procedure: Pacemaker Implant;  Surgeon: Deboraha Sprang, MD;  Location: [place] CV LAB;  Service: Cardiovascular;  Laterality: N/A;  pacemasker     PROSTATE SURGERY     SQUAMOUS CELL CARCINOMA EXCISION     "face, nose left shoulder, left arm"   TONSILLECTOMY AND ADENOIDECTOMY      There were no vitals filed for this visit.   Subjective Assessment - 12/19/20 1509     Subjective Pt is starting to feel the muscles the exercises are working on. Pt is still having a lot of leakage when he is active. Pt leaks some when in the chair. Pt is beginning able to void.  Pt tried wearing a clamp but it seemed to cause more leakage by dilating the sphincter.    Pertinent History Lumbar imagining 05/09/19   "L2 compression deformity is again identified. L1 and L3 pedicle  screws are seen with posterior fixation identified. The overall  appearance is stable from the intraoperative exam. Mild  degenerative  anterolisthesis of L4 on L5 is seen. Intraoperative spot films of the lumbar spine demonstrate initially  2 posterior spinal needles marking the T12 and L3 spinous processes."   HOLEP 07/2020    Patient Stated Goals get his back straight and to learn how to deal chronic back ache in teh sacoiliac area and to regain control of his urinary leakage.                [facility] PT Assessment - 12/19/20 1557       Coordination   Coordination and Movement Description chest breathing ( limited anterior diaphragmatic breathing)      Posture/Postural Control   Posture Comments pre Tx 26 cm earlobe to wall , Post Tx 24 cm      Palpation   Spinal mobility hypomobile T4-L3, posterior intercostal mm tightness B , masseter/platymus tightness                           [facility] Adult PT Treatment/Exercise - 12/19/20 1559       Neuro Re-ed    Neuro Re-ed Details  cued fro deep core and cervical retraction , excessive cues for less chest breathing      Moist Heat Therapy   Number Minutes Moist Heat 5 Minutes    Moist Heat Location --   thoracic during instruction of deep core     Manual Therapy   Manual therapy comments STM/MWM at problem areas noted, small graded movement with cervical rotation with mobilization at thoracic T3-10                         PT Long Term Goals - 12/19/20 1513       PT LONG TERM GOAL #1   Title Pt will complete a chart detailing the method he uses for continence in relationship with activities across each day of the week in order to gather data for baseline    Time 2    Period Weeks    Status On-going      PT LONG TERM GOAL #2   Title Pt will improve gait mechanics and posture  to increase walking endurance from 20 min to > 30 min in order to walk with less pain    Time 6    Period Weeks    Status On-going      PT LONG TERM GOAL #3   Title Pt will demo less forard head posture from 30 cm from earlobe to wall to < 25  cm in order to achieve more upright posture  to optimize IAP system for postural stability ( less LBP) and urinary continence 12/19/20: 24 cm)    Time 8    Period Weeks    Status Achieved     PT LONG TERM GOAL #4   Title Pt will demo IND with deep core coordination and pelvic floor coordination without compensatory patterns to help with ADLs    Time 4    Period Weeks    Status Partially Met      PT LONG TERM GOAL #5   Title Pt will demo less L thoracic shift, less convex curve at T/L junction and more reciprocal gait pattern in order to regain structural midline and to progress to pelvic floor contractions with better outcomes    Time 10    Period Weeks    Status Achieved      Additional Long Term Goals   Additional Long Term Goals Yes      PT LONG TERM GOAL #6   Title Pt will report decreased pain by 50% with Bending over, getting out of bed, reaching up overhead activities in order to perform his hobbies and household chores.    Time 8    Period Weeks    Status On-going      PT LONG TERM GOAL #7   Title Pt will demo proper deep core coordination, pelvic floor contractions 3 sec, 3 reps in order to to minimize leakage while gardening    Baseline chest breathing ( limited anterior diaphragmatic breathing)    Time 8    Period Weeks    Status New    Target Date 02/13/21                   Plan - 12/19/20 1556     Clinical Impression Statement Pt has achieved 2 goals and progressing well towards remaining goals. Pt's posture has significant improvement with a less forward head/thoracic kyphosis and spinal deviations. This is helping with a more efficient IAP system. Initiated diaphragmatic and pelvic floor coordination training today after more manual Tx to promote anterior/lateral excursion of diaphragm. PT required cues for less chest breathing and eventually demo'd IND withi correct technique. Plan to continues thoracic kyphosis which will help with improving innervations  of ilioinguinal/ iliohypogastric nerve for better sensation and bladder function. Plan to address pelvic floor coordination at upcoming sessions. Pt continues to require cues for body mechanics in positional transfers against gravity to minimize straining pelvic floor which will help with his SUI. Pt continues to benefit from skilled PT.    Personal Factors and Comorbidities Comorbidity 3+    Comorbidities Lumbar fusion L1-3    Examination-Activity Limitations Toileting;Lift;Bed Mobility;Locomotion Level;Bend;Sit;Sleep;Continence;Squat;Stand;Transfers;Reach Overhead    Stability/Clinical Decision Making Evolving/Moderate complexity    Rehab Potential Good    PT Frequency 1x / week    PT Duration Other (comment)   10   PT Treatment/Interventions Moist Heat;Therapeutic activities;Therapeutic exercise;Patient/family education;Neuromuscular re-education;Stair training;Gait training;Manual techniques;Taping;Splinting;Dry needling;Balance training;ADLs/Self Care Home Management;Cryotherapy;Traction;Functional mobility training;Energy conservation;Joint Manipulations;Passive range of motion;Scar mobilization    Consulted and Agree with Plan of Care Patient;Family member/caregiver    Family Member Consulted wife             Patient will benefit from skilled therapeutic intervention in order to improve the following deficits and impairments:  Decreased activity tolerance, Decreased endurance, Decreased range of motion, Decreased strength, Decreased coordination, Decreased mobility, Decreased scar mobility, Abnormal gait, Increased fascial restricitons, Impaired sensation, Improper body mechanics, Pain, Hypermobility, Increased muscle spasms, Hypomobility,  Difficulty walking, Decreased knowledge of precautions, Decreased balance, Postural dysfunction, Cardiopulmonary status limiting activity, Decreased safety awareness  Visit Diagnosis: Abnormal posture  Other abnormalities of gait and mobility  Other  idiopathic scoliosis, thoracolumbar region  Other urinary incontinence  Sacrococcygeal disorders, not elsewhere classified  Chronic low back pain without sciatica, unspecified back pain laterality     Problem List Patient Active Problem List   Diagnosis Date Noted   Lumbar burst fracture ([place]) 05/04/2020   Hypogonadism male 10/10/2019   Persistent atrial fibrillation ([place]) 01/12/2019   Acute low back pain 11/04/2017   Abnormal screening cardiac CT    Visit for monitoring Tikosyn therapy 09/09/2017   History of stroke 08/29/2016   Paroxysmal atrial fibrillation ([place]) 07/23/2016   Coronary artery disease 07/23/2016   Snores 06/27/2016   Cardiac pacemaker in situ 06/25/2016   Hemiparesis ([place]) 06/25/2016   Acute ischemic stroke ([place])    Acute left hemiparesis ([facility])    Hemisensory loss    Dysarthria    Stroke-like symptoms    Complete heart block ([facility])    Pain in thoracic spine    Orthostasis    Lethargy    Occlusion of vertebral artery    TIA (transient ischemic attack) 06/14/2016   Arthritis 02/06/2015   Esophageal reflux 02/06/2015   Arthritis urica 02/06/2015   Cannot sleep 02/06/2015   Arthritis, degenerative 02/06/2015   Adenocarcinoma, renal cell ([place]) 02/06/2015   Benign prostatic hyperplasia with lower urinary tract symptoms 11/21/2014   Personal history of other malignant neoplasm of kidney 11/21/2014   Palpitations 12/31/2013   Hyperlipidemia 11/02/2010   HYPERTENSION, BENIGN 04/16/2010    Jerl Mina ,PT, DPT, E-RYT  12/19/2020, 4:32 PM  [place] [facility] MAIN [facility] SERVICES [address] [place], [place], [address] Phone: [phone]   Fax:  [phone]  Name: TRAYVOND VIETS MRN: [id number] Date of Birth: [birth year]

## 2020-12-19 NOTE — Patient Instructions (Addendum)
Chin tuck before rotation    Deep core level 1-2  (2 x day)     Avoid straining pelvic floor, abdominal muscles , spine  Use log rolling technique instead of getting out of bed with your neck or the sit-up     Log rolling into and out of bed   Log rolling into and out of bed If getting out of bed on R side, Bent knees, scoot hips/ shoulder to L  Raise R arm completely overhead, rolling onto armpit  Then lower bent knees to bed to get into complete side lying position  Then drop legs off bed, and push up onto R elbow/forearm, and use L hand to push onto the bed

## 2020-12-26 ENCOUNTER — Other Ambulatory Visit: Payer: Self-pay

## 2020-12-26 ENCOUNTER — Ambulatory Visit: Payer: Medicare Other | Admitting: Physical Therapy

## 2020-12-26 DIAGNOSIS — R2689 Other abnormalities of gait and mobility: Secondary | ICD-10-CM | POA: Diagnosis not present

## 2020-12-26 DIAGNOSIS — R293 Abnormal posture: Secondary | ICD-10-CM

## 2020-12-26 DIAGNOSIS — N39498 Other specified urinary incontinence: Secondary | ICD-10-CM

## 2020-12-26 DIAGNOSIS — M533 Sacrococcygeal disorders, not elsewhere classified: Secondary | ICD-10-CM

## 2020-12-26 DIAGNOSIS — G8929 Other chronic pain: Secondary | ICD-10-CM | POA: Diagnosis not present

## 2020-12-26 DIAGNOSIS — M4125 Other idiopathic scoliosis, thoracolumbar region: Secondary | ICD-10-CM

## 2020-12-26 DIAGNOSIS — M545 Low back pain, unspecified: Secondary | ICD-10-CM | POA: Diagnosis not present

## 2020-12-26 NOTE — Therapy (Signed)
[place] MAIN [facility] SERVICES [address] [place], [place], [address] Phone: [phone]   Fax:  [phone]  Physical Therapy Treatment  Patient Details  Name: Thomas [name] MRN: [id number] Date of Birth: May 09, 1932 Referring Provider (PT): Dawley and Erlene Quan   Encounter Date: 12/26/2020   PT End of Session - 12/26/20 1558     Visit Number 7    Number of Visits 10    Date for PT Re-Evaluation 02/27/21    PT Start Time 1500    PT Stop Time 1600    PT Time Calculation (min) 60 min    Activity Tolerance Patient tolerated treatment well    Behavior During Therapy [facility] for tasks assessed/performed             Past Medical History:  Diagnosis Date   Aortic valve disorders    Arthritis    Atrial flutter ([place]) 06/18/2016   "AF or AFl; not sure which" (06/23/2016)   Basal cell carcinoma    "face, nose left shoulder, left arm" (06/19/2016)   Basal cell carcinoma 09/13/2020   right temple   BBB (bundle branch block)    hx right   Chronic back pain    "neck, thoracic, lower back" (06/19/2016)   Complete heart block ([place]) 06/2016   Dyspnea    GERD (gastroesophageal reflux disease)    Gout    Heart block    "I've had type I, II Wenke before now" (06/19/2016)   History of gout    History of hiatal hernia    "self dx'd" (06/19/2016)   Hyperlipidemia    Hypertension    Lyme disease    "dx'd by me 2003; cx's showed dx 08/2015"   Migraine    "3-4/year" (06/19/2016)   Presence of permanent cardiac pacemaker 06/19/2016   PVC's (premature ventricular contractions)    Renal cancer, left ([place]) 2006   S/P cryotherapy   Spinal stenosis    "cervical, 1 thoracic, lumbar" (06/19/2016)   Squamous carcinoma    "face, nose left shoulder, left arm" (06/19/2016)   Stroke ([place])    TIA (transient ischemic attack) 06/14/2016   "I'm not sure that's what it was" (06/25/2016)   Visit for monitoring Tikosyn therapy 09/09/2017    Past Surgical History:   Procedure Laterality Date   ANKLE FRACTURE SURGERY Right 1967   BACK SURGERY  05/07/2020   BASAL CELL CARCINOMA EXCISION     "face, nose left shoulder, left arm"   BIOPSY PROSTATE  2001 & 2003   CARDIAC CATHETERIZATION  1990's   CARDIOVERSION N/A 09/11/2017   Procedure: CARDIOVERSION;  Surgeon: Lelon Perla, MD;  [facility];  Service: Cardiovascular;  Laterality: N/A;   FRACTURE SURGERY     HOLEP-LASER ENUCLEATION OF THE PROSTATE WITH MORCELLATION N/A 07/10/2020   Procedure: HOLEP-LASER ENUCLEATION OF THE PROSTATE WITH MORCELLATION;  Surgeon: Hollice Espy, MD;  Location: [facility] ORS;  Service: Urology;  Laterality: N/A;   INGUINAL HERNIA REPAIR Left 2012   INSERT / REPLACE / REMOVE PACEMAKER  06/19/2016   LAPAROSCOPIC ABLATION RENAL MASS     LEFT HEART CATH AND CORONARY ANGIOGRAPHY Left 10/23/2017   Procedure: LEFT HEART CATH AND CORONARY ANGIOGRAPHY;  Surgeon: Wellington Hampshire, MD;  Location: [place] CV LAB;  Service: Cardiovascular;  Laterality: Left;   PACEMAKER IMPLANT N/A 06/19/2016   Procedure: Pacemaker Implant;  Surgeon: Deboraha Sprang, MD;  Location: [place] CV LAB;  Service: Cardiovascular;  Laterality: N/A;  pacemasker     PROSTATE SURGERY     SQUAMOUS CELL CARCINOMA EXCISION     "face, nose left shoulder, left arm"   TONSILLECTOMY AND ADENOIDECTOMY      There were no vitals filed for this visit.   Subjective Assessment - 12/26/20 1507     Subjective Pt worked all afternoon carrying stuff up and down the stairs and his back felt sore and theat it was much better since.    Pertinent History Lumbar imagining 05/09/19   "L2 compression deformity is again identified. L1 and L3 pedicle  screws are seen with posterior fixation identified. The overall  appearance is stable from the intraoperative exam. Mild degenerative  anterolisthesis of L4 on L5 is seen. Intraoperative spot films of the lumbar spine demonstrate initially  2 posterior spinal needles marking  the T12 and L3 spinous processes."   HOLEP 07/2020    Patient Stated Goals get his back straight and to learn how to deal chronic back ache in teh sacoiliac area and to regain control of his urinary leakage.                [facility] PT Assessment - 12/26/20 1552       Coordination   Coordination and Movement Description poor propioception with squats due to limited anterior tilt and kyphosis      Palpation   Spinal mobility tightness at posterior back, lats                           [facility] Adult PT Treatment/Exercise - 12/26/20 1548       Neuro Re-ed    Neuro Re-ed Details  cued for squats, wall stretches to lower ribs/ lumbar      Moist Heat Therapy   Number Minutes Moist Heat 5 Minutes    Moist Heat Location --   prone, pillow under belly, blocks under shoulders     Manual Therapy   Manual therapy comments STM/MWM at posterior back mm thoracic , lats, paraspinal                         PT Long Term Goals - 12/19/20 1513       PT LONG TERM GOAL #1   Title Pt will complete a chart detailing the method he uses for continence in relationship with activities across each day of the week in order to gather data for baseline    Time 2    Period Weeks    Status On-going      PT LONG TERM GOAL #2   Title Pt will improve gait mechanics and posture  to increase walking endurance from 20 min to > 30 min in order to walk with less pain    Time 6    Period Weeks    Status On-going      PT LONG TERM GOAL #3   Title Pt will demo less forard head posture from 30 cm from earlobe to wall to < 25 cm in order to achieve more upright posture to optimize IAP system for postural stability ( less LBP) and urinary continence 12/19/20: 26 cm)    Time 8    Period Weeks    Status Partially Met      PT LONG TERM GOAL #4   Title Pt will demo IND with deep core coordination and pelvic floor coordination without compensatory patterns to help with ADLs  Time 4     Period Weeks    Status Partially Met      PT LONG TERM GOAL #5   Title Pt will demo less L thoracic shift, less convex curve at T/L junction and more reciprocal gait pattern in order to regain structural midline and to progress to pelvic floor contractions with better outcomes    Time 10    Period Weeks    Status Achieved      Additional Long Term Goals   Additional Long Term Goals Yes      PT LONG TERM GOAL #6   Title Pt will report decreased pain by 50% with Bending over, getting out of bed, reaching up overhead activities in order to perform his hobbies and household chores.    Time 8    Period Weeks    Status On-going      PT LONG TERM GOAL #7   Title Pt will demo proper deep core coordination, pelvic floor contractions 3 sec, 3 reps in order to to minimize leakage while gardening    Baseline chest breathing ( limited anterior diaphragmatic breathing)    Time 8    Period Weeks    Status New    Target Date 02/13/21                   Plan - 12/26/20 1558     Clinical Impression Statement Pt progressed to more lengthening stretches to T/L junction but was not ready for resistance band exercises due to poor propioception. Stretches were selected in CK[facility] to optimize lengthening and propioception. Pt continues to benefit from skilled PT    Personal Factors and Comorbidities Comorbidity 3+    Comorbidities Lumbar fusion L1-3    Examination-Activity Limitations Toileting;Lift;Bed Mobility;Locomotion Level;Bend;Sit;Sleep;Continence;Squat;Stand;Transfers;Reach Overhead    Stability/Clinical Decision Making Evolving/Moderate complexity    Rehab Potential Good    PT Frequency 1x / week    PT Duration Other (comment)   10   PT Treatment/Interventions Moist Heat;Therapeutic activities;Therapeutic exercise;Patient/family education;Neuromuscular re-education;Stair training;Gait training;Manual techniques;Taping;Splinting;Dry needling;Balance training;ADLs/Self Care Home  Management;Cryotherapy;Traction;Functional mobility training;Energy conservation;Joint Manipulations;Passive range of motion;Scar mobilization    Consulted and Agree with Plan of Care Patient;Family member/caregiver    Family Member Consulted wife             Patient will benefit from skilled therapeutic intervention in order to improve the following deficits and impairments:  Decreased activity tolerance, Decreased endurance, Decreased range of motion, Decreased strength, Decreased coordination, Decreased mobility, Decreased scar mobility, Abnormal gait, Increased fascial restricitons, Impaired sensation, Improper body mechanics, Pain, Hypermobility, Increased muscle spasms, Hypomobility, Difficulty walking, Decreased knowledge of precautions, Decreased balance, Postural dysfunction, Cardiopulmonary status limiting activity, Decreased safety awareness  Visit Diagnosis: Abnormal posture  Other idiopathic scoliosis, thoracolumbar region  Other abnormalities of gait and mobility  Other urinary incontinence  Sacrococcygeal disorders, not elsewhere classified  Chronic low back pain without sciatica, unspecified back pain laterality     Problem List Patient Active Problem List   Diagnosis Date Noted   Lumbar burst fracture ([place]) 05/04/2020   Hypogonadism male 10/10/2019   Persistent atrial fibrillation ([place]) 01/12/2019   Acute low back pain 11/04/2017   Abnormal screening cardiac CT    Visit for monitoring Tikosyn therapy 09/09/2017   History of stroke 08/29/2016   Paroxysmal atrial fibrillation ([place]) 07/23/2016   Coronary artery disease 07/23/2016   Snores 06/27/2016   Cardiac pacemaker in situ 06/25/2016   Hemiparesis ([place]) 06/25/2016   Acute ischemic stroke ([facility])  Acute left hemiparesis ([facility])    Hemisensory loss    Dysarthria    Stroke-like symptoms    Complete heart block ([facility])    Pain in thoracic spine    Orthostasis    Lethargy    Occlusion of vertebral artery     TIA (transient ischemic attack) 06/14/2016   Arthritis 02/06/2015   Esophageal reflux 02/06/2015   Arthritis urica 02/06/2015   Cannot sleep 02/06/2015   Arthritis, degenerative 02/06/2015   Adenocarcinoma, renal cell ([place]) 02/06/2015   Benign prostatic hyperplasia with lower urinary tract symptoms 11/21/2014   Personal history of other malignant neoplasm of kidney 11/21/2014   Palpitations 12/31/2013   Hyperlipidemia 11/02/2010   HYPERTENSION, BENIGN 04/16/2010    Jerl Mina ,PT, DPT, E-RYT  12/26/2020, 3:59 PM  [place] MAIN [facility] SERVICES [address] [place], [place], [address] Phone: [phone]   Fax:  [phone]  Name: Thomas [name] MRN: [id number] Date of Birth: 15-Jul-1932

## 2020-12-26 NOTE — Patient Instructions (Signed)
Dolphin stretch with heels up and lower heels Interlaced fists 10 reps    _________________________________  Standing parallel to wall, leg closest to wall back in lunges position fist is by ear, quarter circle down elbow down and back 10 reps both sides   ________________________________ childs poses rocking   Toes tucked, shoulders down and back, on forearms , hands shoulder width apart  10 reps    ___ Minisquat: Scoot buttocks back slight, hinge like you are looking at your reflection on a pond  Knees behind toes,  Inhale to "smell flowers"  Exhale on the rise "like rocket"  Do not lock knees, have more weight across ballmounds of feet, toes relaxed   10 reps x 3 x day

## 2021-01-02 ENCOUNTER — Other Ambulatory Visit: Payer: Self-pay

## 2021-01-02 ENCOUNTER — Ambulatory Visit: Payer: Medicare Other | Admitting: Physical Therapy

## 2021-01-02 DIAGNOSIS — N39498 Other specified urinary incontinence: Secondary | ICD-10-CM

## 2021-01-02 DIAGNOSIS — R293 Abnormal posture: Secondary | ICD-10-CM | POA: Diagnosis not present

## 2021-01-02 DIAGNOSIS — R2689 Other abnormalities of gait and mobility: Secondary | ICD-10-CM | POA: Diagnosis not present

## 2021-01-02 DIAGNOSIS — M545 Low back pain, unspecified: Secondary | ICD-10-CM | POA: Diagnosis not present

## 2021-01-02 DIAGNOSIS — M533 Sacrococcygeal disorders, not elsewhere classified: Secondary | ICD-10-CM | POA: Diagnosis not present

## 2021-01-02 DIAGNOSIS — G8929 Other chronic pain: Secondary | ICD-10-CM | POA: Diagnosis not present

## 2021-01-02 DIAGNOSIS — M4125 Other idiopathic scoliosis, thoracolumbar region: Secondary | ICD-10-CM

## 2021-01-02 NOTE — Therapy (Signed)
[place] MAIN [facility] SERVICES [address] [place], [place], [address] Phone: [phone]   Fax:  [phone]  Physical Therapy Treatment  Patient Details  Name: Thomas [name] MRN: [id number] Date of Birth: Jun 10, 1932 Referring Provider (PT): Dawley and Erlene Quan   Encounter Date: 01/02/2021   PT End of Session - 01/02/21 1508     Visit Number 8    Number of Visits 10    Date for PT Re-Evaluation 02/27/21    PT Start Time 1502    PT Stop Time 1600    PT Time Calculation (min) 58 min    Activity Tolerance Patient tolerated treatment well    Behavior During Therapy [facility] for tasks assessed/performed             Past Medical History:  Diagnosis Date   Aortic valve disorders    Arthritis    Atrial flutter ([place]) 06/18/2016   "AF or AFl; not sure which" (06/23/2016)   Basal cell carcinoma    "face, nose left shoulder, left arm" (06/19/2016)   Basal cell carcinoma 09/13/2020   right temple   BBB (bundle branch block)    hx right   Chronic back pain    "neck, thoracic, lower back" (06/19/2016)   Complete heart block ([place]) 06/2016   Dyspnea    GERD (gastroesophageal reflux disease)    Gout    Heart block    "I've had type I, II Wenke before now" (06/19/2016)   History of gout    History of hiatal hernia    "self dx'd" (06/19/2016)   Hyperlipidemia    Hypertension    Lyme disease    "dx'd by me 2003; cx's showed dx 08/2015"   Migraine    "3-4/year" (06/19/2016)   Presence of permanent cardiac pacemaker 06/19/2016   PVC's (premature ventricular contractions)    Renal cancer, left ([place]) 2006   S/P cryotherapy   Spinal stenosis    "cervical, 1 thoracic, lumbar" (06/19/2016)   Squamous carcinoma    "face, nose left shoulder, left arm" (06/19/2016)   Stroke ([place])    TIA (transient ischemic attack) 06/14/2016   "I'm not sure that's what it was" (06/25/2016)   Visit for monitoring Tikosyn therapy 09/09/2017    Past Surgical History:   Procedure Laterality Date   ANKLE FRACTURE SURGERY Right 1967   BACK SURGERY  05/07/2020   BASAL CELL CARCINOMA EXCISION     "face, nose left shoulder, left arm"   BIOPSY PROSTATE  2001 & 2003   CARDIAC CATHETERIZATION  1990's   CARDIOVERSION N/A 09/11/2017   Procedure: CARDIOVERSION;  Surgeon: Lelon Perla, MD;  [facility];  Service: Cardiovascular;  Laterality: N/A;   FRACTURE SURGERY     HOLEP-LASER ENUCLEATION OF THE PROSTATE WITH MORCELLATION N/A 07/10/2020   Procedure: HOLEP-LASER ENUCLEATION OF THE PROSTATE WITH MORCELLATION;  Surgeon: Hollice Espy, MD;  Location: [facility] ORS;  Service: Urology;  Laterality: N/A;   INGUINAL HERNIA REPAIR Left 2012   INSERT / REPLACE / REMOVE PACEMAKER  06/19/2016   LAPAROSCOPIC ABLATION RENAL MASS     LEFT HEART CATH AND CORONARY ANGIOGRAPHY Left 10/23/2017   Procedure: LEFT HEART CATH AND CORONARY ANGIOGRAPHY;  Surgeon: Wellington Hampshire, MD;  Location: [place] CV LAB;  Service: Cardiovascular;  Laterality: Left;   PACEMAKER IMPLANT N/A 06/19/2016   Procedure: Pacemaker Implant;  Surgeon: Deboraha Sprang, MD;  Location: [place] CV LAB;  Service: Cardiovascular;  Laterality: N/A;  pacemasker     PROSTATE SURGERY     SQUAMOUS CELL CARCINOMA EXCISION     "face, nose left shoulder, left arm"   TONSILLECTOMY AND ADENOIDECTOMY      There were no vitals filed for this visit.   Subjective Assessment - 01/02/21 1507     Subjective Pt reported he has been doing his exercises. Pt can go just a short period without a drip. At night , pt has been leaking less now!    Pertinent History Lumbar imagining 05/09/19   "L2 compression deformity is again identified. L1 and L3 pedicle  screws are seen with posterior fixation identified. The overall  appearance is stable from the intraoperative exam. Mild degenerative  anterolisthesis of L4 on L5 is seen. Intraoperative spot films of the lumbar spine demonstrate initially  2 posterior spinal  needles marking the T12 and L3 spinous processes."   HOLEP 07/2020    Patient Stated Goals get his back straight and to learn how to deal chronic back ache in teh sacoiliac area and to regain control of his urinary leakage.                [facility] PT Assessment - 01/02/21 1612       Palpation   Spinal mobility tightness at mid intercostals, inferior scapula attachments, platysmus, attachments to occiput B, C2-3 hypmobile    SI assessment  --                           [facility] Adult PT Treatment/Exercise - 01/02/21 1635       Neuro Re-ed    Neuro Re-ed Details  cued fro cervical retraction without lifting head in supine      Moist Heat Therapy   Number Minutes Moist Heat 10 Minutes    Moist Heat Location --   cervical/ throacic during abdominal fascial release     Manual Therapy   Manual therapy comments STM/MWM at problem areas noted in assessment                         PT Long Term Goals - 12/19/20 1513       PT LONG TERM GOAL #1   Title Pt will complete a chart detailing the method he uses for continence in relationship with activities across each day of the week in order to gather data for baseline    Time 2    Period Weeks    Status On-going      PT LONG TERM GOAL #2   Title Pt will improve gait mechanics and posture  to increase walking endurance from 20 min to > 30 min in order to walk with less pain    Time 6    Period Weeks    Status On-going      PT LONG TERM GOAL #3   Title Pt will demo less forard head posture from 30 cm from earlobe to wall to < 25 cm in order to achieve more upright posture to optimize IAP system for postural stability ( less LBP) and urinary continence 12/19/20: 26 cm)    Time 8    Period Weeks    Status Partially Met      PT LONG TERM GOAL #4   Title Pt will demo IND with deep core coordination and pelvic floor coordination without compensatory patterns to help with ADLs    Time 4    Period  Weeks     Status Partially Met      PT LONG TERM GOAL #5   Title Pt will demo less L thoracic shift, less convex curve at T/L junction and more reciprocal gait pattern in order to regain structural midline and to progress to pelvic floor contractions with better outcomes    Time 10    Period Weeks    Status Achieved      Additional Long Term Goals   Additional Long Term Goals Yes      PT LONG TERM GOAL #6   Title Pt will report decreased pain by 50% with Bending over, getting out of bed, reaching up overhead activities in order to perform his hobbies and household chores.    Time 8    Period Weeks    Status On-going      PT LONG TERM GOAL #7   Title Pt will demo proper deep core coordination, pelvic floor contractions 3 sec, 3 reps in order to to minimize leakage while gardening    Baseline chest breathing ( limited anterior diaphragmatic breathing)    Time 8    Period Weeks    Status New    Target Date 02/13/21                   Plan - 01/02/21 1508     Clinical Impression Statement Pt reports less leakage in the day and night.  Pt continued to require more manual Tx to further mobilize thoracic and cervical spine which will help with achievement of sacral nutation which is needed for continence. Initiated fascial release over abdomen. Plan to continue zooming to abdominopelvic area now that thoracic kyphosis has improved and deep core exercises are in place to strengthen IAP and postural stability. Pt continues to benefit from skilled PT    Personal Factors and Comorbidities Comorbidity 3+    Comorbidities Lumbar fusion L1-3    Examination-Activity Limitations Toileting;Lift;Bed Mobility;Locomotion Level;Bend;Sit;Sleep;Continence;Squat;Stand;Transfers;Reach Overhead    Stability/Clinical Decision Making Evolving/Moderate complexity    Rehab Potential Good    PT Frequency 1x / week    PT Duration Other (comment)   10   PT Treatment/Interventions Moist Heat;Therapeutic  activities;Therapeutic exercise;Patient/family education;Neuromuscular re-education;Stair training;Gait training;Manual techniques;Taping;Splinting;Dry needling;Balance training;ADLs/Self Care Home Management;Cryotherapy;Traction;Functional mobility training;Energy conservation;Joint Manipulations;Passive range of motion;Scar mobilization    Consulted and Agree with Plan of Care Patient;Family member/caregiver    Family Member Consulted wife             Patient will benefit from skilled therapeutic intervention in order to improve the following deficits and impairments:  Decreased activity tolerance, Decreased endurance, Decreased range of motion, Decreased strength, Decreased coordination, Decreased mobility, Decreased scar mobility, Abnormal gait, Increased fascial restricitons, Impaired sensation, Improper body mechanics, Pain, Hypermobility, Increased muscle spasms, Hypomobility, Difficulty walking, Decreased knowledge of precautions, Decreased balance, Postural dysfunction, Cardiopulmonary status limiting activity, Decreased safety awareness  Visit Diagnosis: Other idiopathic scoliosis, thoracolumbar region  Abnormal posture  Other abnormalities of gait and mobility  Sacrococcygeal disorders, not elsewhere classified  Other urinary incontinence  Chronic low back pain without sciatica, unspecified back pain laterality     Problem List Patient Active Problem List   Diagnosis Date Noted   Lumbar burst fracture ([place]) 05/04/2020   Hypogonadism male 10/10/2019   Persistent atrial fibrillation ([place]) 01/12/2019   Acute low back pain 11/04/2017   Abnormal screening cardiac CT    Visit for monitoring Tikosyn therapy 09/09/2017   History of stroke 08/29/2016   Paroxysmal atrial  fibrillation ([place]) 07/23/2016   Coronary artery disease 07/23/2016   Snores 06/27/2016   Cardiac pacemaker in situ 06/25/2016   Hemiparesis ([place]) 06/25/2016   Acute ischemic stroke ([place])    Acute left  hemiparesis ([facility])    Hemisensory loss    Dysarthria    Stroke-like symptoms    Complete heart block ([facility])    Pain in thoracic spine    Orthostasis    Lethargy    Occlusion of vertebral artery    TIA (transient ischemic attack) 06/14/2016   Arthritis 02/06/2015   Esophageal reflux 02/06/2015   Arthritis urica 02/06/2015   Cannot sleep 02/06/2015   Arthritis, degenerative 02/06/2015   Adenocarcinoma, renal cell ([place]) 02/06/2015   Benign prostatic hyperplasia with lower urinary tract symptoms 11/21/2014   Personal history of other malignant neoplasm of kidney 11/21/2014   Palpitations 12/31/2013   Hyperlipidemia 11/02/2010   HYPERTENSION, BENIGN 04/16/2010    Jerl Mina ,PT, DPT, E-RYT  01/02/2021, 4:42 PM  [place] MAIN [facility] SERVICES [address] [place], [place], [address] Phone: [phone]   Fax:  [phone]  Name: Thomas [name] MRN: [id number] Date of Birth: Jan 30, 1933

## 2021-01-06 ENCOUNTER — Other Ambulatory Visit: Payer: Self-pay | Admitting: Dermatology

## 2021-01-06 DIAGNOSIS — B353 Tinea pedis: Secondary | ICD-10-CM

## 2021-01-09 ENCOUNTER — Ambulatory Visit: Payer: Medicare Other | Admitting: Physical Therapy

## 2021-01-11 ENCOUNTER — Other Ambulatory Visit: Payer: Self-pay | Admitting: Family Medicine

## 2021-01-15 ENCOUNTER — Ambulatory Visit: Payer: Medicare Other | Admitting: Physical Therapy

## 2021-01-17 NOTE — Telephone Encounter (Signed)
Unable to leave a message. Per Dr. Nehemiah Massed will follow up at appointment next year as nails typically take about 1 year to grow out.

## 2021-01-22 ENCOUNTER — Ambulatory Visit: Payer: Medicare Other | Attending: Urology | Admitting: Physical Therapy

## 2021-01-22 ENCOUNTER — Other Ambulatory Visit: Payer: Self-pay

## 2021-01-22 DIAGNOSIS — M545 Low back pain, unspecified: Secondary | ICD-10-CM | POA: Insufficient documentation

## 2021-01-22 DIAGNOSIS — N39498 Other specified urinary incontinence: Secondary | ICD-10-CM | POA: Diagnosis present

## 2021-01-22 DIAGNOSIS — R293 Abnormal posture: Secondary | ICD-10-CM | POA: Diagnosis not present

## 2021-01-22 DIAGNOSIS — M4125 Other idiopathic scoliosis, thoracolumbar region: Secondary | ICD-10-CM | POA: Diagnosis not present

## 2021-01-22 DIAGNOSIS — G8929 Other chronic pain: Secondary | ICD-10-CM | POA: Diagnosis not present

## 2021-01-22 DIAGNOSIS — R2689 Other abnormalities of gait and mobility: Secondary | ICD-10-CM | POA: Insufficient documentation

## 2021-01-22 DIAGNOSIS — M533 Sacrococcygeal disorders, not elsewhere classified: Secondary | ICD-10-CM | POA: Diagnosis not present

## 2021-01-22 NOTE — Therapy (Signed)
[place] MAIN [facility] SERVICES [address] [place], [place], [address] Phone: [phone]   Fax:  [phone]  Physical Therapy Treatment  Patient Details  Name: Thomas [name] MRN: [id number] Date of Birth: August 22, 1932 Referring Provider (PT): Dawley and Erlene Quan   Encounter Date: 01/22/2021   PT End of Session - 01/22/21 1503     Visit Number 9    Number of Visits 10    Date for PT Re-Evaluation 02/27/21    PT Start Time 6962    PT Stop Time 9528    PT Time Calculation (min) 54 min    Activity Tolerance Patient tolerated treatment well    Behavior During Therapy [facility] for tasks assessed/performed             Past Medical History:  Diagnosis Date   Aortic valve disorders    Arthritis    Atrial flutter ([place]) 06/18/2016   "AF or AFl; not sure which" (06/23/2016)   Basal cell carcinoma    "face, nose left shoulder, left arm" (06/19/2016)   Basal cell carcinoma 09/13/2020   right temple   BBB (bundle branch block)    hx right   Chronic back pain    "neck, thoracic, lower back" (06/19/2016)   Complete heart block ([place]) 06/2016   Dyspnea    GERD (gastroesophageal reflux disease)    Gout    Heart block    "I've had type I, II Wenke before now" (06/19/2016)   History of gout    History of hiatal hernia    "self dx'd" (06/19/2016)   Hyperlipidemia    Hypertension    Lyme disease    "dx'd by me 2003; cx's showed dx 08/2015"   Migraine    "3-4/year" (06/19/2016)   Presence of permanent cardiac pacemaker 06/19/2016   PVC's (premature ventricular contractions)    Renal cancer, left ([place]) 2006   S/P cryotherapy   Spinal stenosis    "cervical, 1 thoracic, lumbar" (06/19/2016)   Squamous carcinoma    "face, nose left shoulder, left arm" (06/19/2016)   Stroke ([place])    TIA (transient ischemic attack) 06/14/2016   "I'm not sure that's what it was" (06/25/2016)   Visit for monitoring Tikosyn therapy 09/09/2017    Past Surgical History:   Procedure Laterality Date   ANKLE FRACTURE SURGERY Right 1967   BACK SURGERY  05/07/2020   BASAL CELL CARCINOMA EXCISION     "face, nose left shoulder, left arm"   BIOPSY PROSTATE  2001 & 2003   CARDIAC CATHETERIZATION  1990's   CARDIOVERSION N/A 09/11/2017   Procedure: CARDIOVERSION;  Surgeon: Lelon Perla, MD;  [facility];  Service: Cardiovascular;  Laterality: N/A;   FRACTURE SURGERY     HOLEP-LASER ENUCLEATION OF THE PROSTATE WITH MORCELLATION N/A 07/10/2020   Procedure: HOLEP-LASER ENUCLEATION OF THE PROSTATE WITH MORCELLATION;  Surgeon: Hollice Espy, MD;  Location: [facility] ORS;  Service: Urology;  Laterality: N/A;   INGUINAL HERNIA REPAIR Left 2012   INSERT / REPLACE / REMOVE PACEMAKER  06/19/2016   LAPAROSCOPIC ABLATION RENAL MASS     LEFT HEART CATH AND CORONARY ANGIOGRAPHY Left 10/23/2017   Procedure: LEFT HEART CATH AND CORONARY ANGIOGRAPHY;  Surgeon: Wellington Hampshire, MD;  Location: [place] CV LAB;  Service: Cardiovascular;  Laterality: Left;   PACEMAKER IMPLANT N/A 06/19/2016   Procedure: Pacemaker Implant;  Surgeon: Deboraha Sprang, MD;  Location: [place] CV LAB;  Service: Cardiovascular;  Laterality: N/A;  pacemasker     PROSTATE SURGERY     SQUAMOUS CELL CARCINOMA EXCISION     "face, nose left shoulder, left arm"   TONSILLECTOMY AND ADENOIDECTOMY      There were no vitals filed for this visit.   Subjective Assessment - 01/22/21 1458     Subjective Pt reported decreased sensation by low abdominal mm pelvic area . It is better than after the wereck    Pertinent History Lumbar imagining 05/09/19   "L2 compression deformity is again identified. L1 and L3 pedicle  screws are seen with posterior fixation identified. The overall  appearance is stable from the intraoperative exam. Mild degenerative  anterolisthesis of L4 on L5 is seen. Intraoperative spot films of the lumbar spine demonstrate initially  2 posterior spinal needles marking the T12 and L3  spinous processes."   HOLEP 07/2020    Patient Stated Goals get his back straight and to learn how to deal chronic back ache in teh sacoiliac area and to regain control of his urinary leakage.                [facility] PT Assessment - 01/22/21 1500       Palpation   Palpation comment restricted lumbar scar ( reported increased sensation duringm obilization of scar Tx)                        Pelvic Floor Special Questions - 01/22/21 1501     External Perineal Exam sensory: Q-tip test bilateral:  decreased sensation R at low supra pubic, L groin ( bilateral sensaiton post Tx)               [facility] Adult PT Treatment/Exercise - 01/22/21 1459       Therapeutic Activites    Other Therapeutic Activities sensory testing, explained Tx to day to help with sensation      Manual Therapy   Manual therapy comments gentle manual for cutaneous nn mobilization                          PT Long Term Goals - 12/19/20 1513       PT LONG TERM GOAL #1   Title Pt will complete a chart detailing the method he uses for continence in relationship with activities across each day of the week in order to gather data for baseline    Time 2    Period Weeks    Status On-going      PT LONG TERM GOAL #2   Title Pt will improve gait mechanics and posture  to increase walking endurance from 20 min to > 30 min in order to walk with less pain    Time 6    Period Weeks    Status On-going      PT LONG TERM GOAL #3   Title Pt will demo less forard head posture from 30 cm from earlobe to wall to < 25 cm in order to achieve more upright posture to optimize IAP system for postural stability ( less LBP) and urinary continence 12/19/20: 26 cm)    Time 8    Period Weeks    Status Partially Met      PT LONG TERM GOAL #4   Title Pt will demo IND with deep core coordination and pelvic floor coordination without compensatory patterns to help with ADLs    Time 4    Period Weeks  Status Partially Met      PT LONG TERM GOAL #5   Title Pt will demo less L thoracic shift, less convex curve at T/L junction and more reciprocal gait pattern in order to regain structural midline and to progress to pelvic floor contractions with better outcomes    Time 10    Period Weeks    Status Achieved      Additional Long Term Goals   Additional Long Term Goals Yes      PT LONG TERM GOAL #6   Title Pt will report decreased pain by 50% with Bending over, getting out of bed, reaching up overhead activities in order to perform his hobbies and household chores.    Time 8    Period Weeks    Status On-going      PT LONG TERM GOAL #7   Title Pt will demo proper deep core coordination, pelvic floor contractions 3 sec, 3 reps in order to to minimize leakage while gardening    Baseline chest breathing ( limited anterior diaphragmatic breathing)    Time 8    Period Weeks    Status New    Target Date 02/13/21                   Plan - 01/22/21 1503     Clinical Impression Statement Tx today was focused on addressing decreased sensation at R low abdomen and L pelvic floor area. Pt reported equal sensation bilaterally post Tx. Lumbar scar restrictions were addressed as well. Pt will still need more manual Tx to help with regaining sensation.  Pt continues to benefit from skilled PT.    Personal Factors and Comorbidities Comorbidity 3+    Comorbidities Lumbar fusion L1-3    Examination-Activity Limitations Toileting;Lift;Bed Mobility;Locomotion Level;Bend;Sit;Sleep;Continence;Squat;Stand;Transfers;Reach Overhead    Stability/Clinical Decision Making Evolving/Moderate complexity    Rehab Potential Good    PT Frequency 1x / week    PT Duration Other (comment)   10   PT Treatment/Interventions Moist Heat;Therapeutic activities;Therapeutic exercise;Patient/family education;Neuromuscular re-education;Stair training;Gait training;Manual techniques;Taping;Splinting;Dry needling;Balance  training;ADLs/Self Care Home Management;Cryotherapy;Traction;Functional mobility training;Energy conservation;Joint Manipulations;Passive range of motion;Scar mobilization    Consulted and Agree with Plan of Care Patient;Family member/caregiver    Family Member Consulted wife             Patient will benefit from skilled therapeutic intervention in order to improve the following deficits and impairments:  Decreased activity tolerance, Decreased endurance, Decreased range of motion, Decreased strength, Decreased coordination, Decreased mobility, Decreased scar mobility, Abnormal gait, Increased fascial restricitons, Impaired sensation, Improper body mechanics, Pain, Hypermobility, Increased muscle spasms, Hypomobility, Difficulty walking, Decreased knowledge of precautions, Decreased balance, Postural dysfunction, Cardiopulmonary status limiting activity, Decreased safety awareness  Visit Diagnosis: Other idiopathic scoliosis, thoracolumbar region  Sacrococcygeal disorders, not elsewhere classified  Abnormal posture  Other abnormalities of gait and mobility  Other urinary incontinence  Chronic low back pain without sciatica, unspecified back pain laterality     Problem List Patient Active Problem List   Diagnosis Date Noted   Lumbar burst fracture ([place]) 05/04/2020   Hypogonadism male 10/10/2019   Persistent atrial fibrillation ([place]) 01/12/2019   Acute low back pain 11/04/2017   Abnormal screening cardiac CT    Visit for monitoring Tikosyn therapy 09/09/2017   History of stroke 08/29/2016   Paroxysmal atrial fibrillation ([place]) 07/23/2016   Coronary artery disease 07/23/2016   Snores 06/27/2016   Cardiac pacemaker in situ 06/25/2016   Hemiparesis ([employer]) 06/25/2016   Acute ischemic  stroke ([place])    Acute left hemiparesis ([facility])    Hemisensory loss    Dysarthria    Stroke-like symptoms    Complete heart block ([facility])    Pain in thoracic spine    Orthostasis    Lethargy     Occlusion of vertebral artery    TIA (transient ischemic attack) 06/14/2016   Arthritis 02/06/2015   Esophageal reflux 02/06/2015   Arthritis urica 02/06/2015   Cannot sleep 02/06/2015   Arthritis, degenerative 02/06/2015   Adenocarcinoma, renal cell ([place]) 02/06/2015   Benign prostatic hyperplasia with lower urinary tract symptoms 11/21/2014   Personal history of other malignant neoplasm of kidney 11/21/2014   Palpitations 12/31/2013   Hyperlipidemia 11/02/2010   HYPERTENSION, BENIGN 04/16/2010    Jerl Mina, PT 01/22/2021, 5:11 PM  [place] [address] [place], [place], [address] Phone: [phone]   Fax:  [phone]  Name: Thomas [name] MRN: [id number] Date of Birth: January 09, 1933

## 2021-01-24 ENCOUNTER — Other Ambulatory Visit: Payer: Self-pay | Admitting: Urology

## 2021-01-26 ENCOUNTER — Other Ambulatory Visit: Payer: Self-pay | Admitting: Urology

## 2021-01-30 ENCOUNTER — Ambulatory Visit (INDEPENDENT_AMBULATORY_CARE_PROVIDER_SITE_OTHER): Payer: Medicare Other

## 2021-01-30 DIAGNOSIS — I442 Atrioventricular block, complete: Secondary | ICD-10-CM

## 2021-02-02 LAB — CUP PACEART REMOTE DEVICE CHECK
Battery Remaining Longevity: 64 mo
Battery Voltage: 2.96 V
Brady Statistic AP VP Percent: 95.83 %
Brady Statistic AP VS Percent: 0.01 %
Brady Statistic AS VP Percent: 4.16 %
Brady Statistic AS VS Percent: 0.01 %
Brady Statistic RA Percent Paced: 94.6 %
Brady Statistic RV Percent Paced: 99.97 %
Date Time Interrogation Session: 20220929171253
Implantable Lead Implant Date: 20180214
Implantable Lead Implant Date: 20180214
Implantable Lead Location: 753859
Implantable Lead Location: 753860
Implantable Lead Model: 5076
Implantable Lead Model: 5076
Implantable Pulse Generator Implant Date: 20180214
Lead Channel Impedance Value: 304 Ohm
Lead Channel Impedance Value: 323 Ohm
Lead Channel Impedance Value: 399 Ohm
Lead Channel Impedance Value: 399 Ohm
Lead Channel Pacing Threshold Amplitude: 0.5 V
Lead Channel Pacing Threshold Amplitude: 0.875 V
Lead Channel Pacing Threshold Pulse Width: 0.4 ms
Lead Channel Pacing Threshold Pulse Width: 0.4 ms
Lead Channel Sensing Intrinsic Amplitude: 18.375 mV
Lead Channel Sensing Intrinsic Amplitude: 18.375 mV
Lead Channel Sensing Intrinsic Amplitude: 3.25 mV
Lead Channel Sensing Intrinsic Amplitude: 3.25 mV
Lead Channel Setting Pacing Amplitude: 2 V
Lead Channel Setting Pacing Amplitude: 2.5 V
Lead Channel Setting Pacing Pulse Width: 0.4 ms
Lead Channel Setting Sensing Sensitivity: 2 mV

## 2021-02-05 ENCOUNTER — Other Ambulatory Visit: Payer: Self-pay

## 2021-02-05 ENCOUNTER — Ambulatory Visit: Payer: Medicare Other | Attending: Urology | Admitting: Physical Therapy

## 2021-02-05 DIAGNOSIS — R2689 Other abnormalities of gait and mobility: Secondary | ICD-10-CM | POA: Diagnosis not present

## 2021-02-05 DIAGNOSIS — M545 Low back pain, unspecified: Secondary | ICD-10-CM | POA: Diagnosis not present

## 2021-02-05 DIAGNOSIS — M533 Sacrococcygeal disorders, not elsewhere classified: Secondary | ICD-10-CM | POA: Diagnosis not present

## 2021-02-05 DIAGNOSIS — M4125 Other idiopathic scoliosis, thoracolumbar region: Secondary | ICD-10-CM | POA: Diagnosis not present

## 2021-02-05 DIAGNOSIS — R293 Abnormal posture: Secondary | ICD-10-CM | POA: Diagnosis not present

## 2021-02-05 DIAGNOSIS — G8929 Other chronic pain: Secondary | ICD-10-CM | POA: Diagnosis not present

## 2021-02-05 DIAGNOSIS — N39498 Other specified urinary incontinence: Secondary | ICD-10-CM | POA: Diagnosis present

## 2021-02-05 NOTE — Therapy (Signed)
[place] MAIN [facility] SERVICES [address] [place], [place], [address] Phone: [phone]   Fax:  [phone]  Physical Therapy Treatment Progress Note reporting from 09/18/20 to 02/05/21   Patient Details  Name: Thomas [name] MRN: [id number] Date of Birth: 11-25-1932 Referring Provider (PT): Dawley and Erlene Quan   Encounter Date: 02/05/2021   PT End of Session - 02/05/21 1631     Visit Number 10    Date for PT Re-Evaluation 02/27/21    PT Start Time 1400    PT Stop Time 1500    PT Time Calculation (min) 60 min    Activity Tolerance Patient tolerated treatment well    Behavior During Therapy [facility] for tasks assessed/performed             Past Medical History:  Diagnosis Date   Aortic valve disorders    Arthritis    Atrial flutter ([place]) 06/18/2016   "AF or AFl; not sure which" (06/23/2016)   Basal cell carcinoma    "face, nose left shoulder, left arm" (06/19/2016)   Basal cell carcinoma 09/13/2020   right temple   BBB (bundle branch block)    hx right   Chronic back pain    "neck, thoracic, lower back" (06/19/2016)   Complete heart block ([place]) 06/2016   Dyspnea    GERD (gastroesophageal reflux disease)    Gout    Heart block    "I've had type I, II Wenke before now" (06/19/2016)   History of gout    History of hiatal hernia    "self dx'd" (06/19/2016)   Hyperlipidemia    Hypertension    Lyme disease    "dx'd by me 2003; cx's showed dx 08/2015"   Migraine    "3-4/year" (06/19/2016)   Presence of permanent cardiac pacemaker 06/19/2016   PVC's (premature ventricular contractions)    Renal cancer, left ([place]) 2006   S/P cryotherapy   Spinal stenosis    "cervical, 1 thoracic, lumbar" (06/19/2016)   Squamous carcinoma    "face, nose left shoulder, left arm" (06/19/2016)   Stroke ([place])    TIA (transient ischemic attack) 06/14/2016   "I'm not sure that's what it was" (06/25/2016)   Visit for monitoring Tikosyn therapy 09/09/2017     Past Surgical History:  Procedure Laterality Date   ANKLE FRACTURE SURGERY Right 1967   BACK SURGERY  05/07/2020   BASAL CELL CARCINOMA EXCISION     "face, nose left shoulder, left arm"   BIOPSY PROSTATE  2001 & 2003   CARDIAC CATHETERIZATION  1990's   CARDIOVERSION N/A 09/11/2017   Procedure: CARDIOVERSION;  Surgeon: Lelon Perla, MD;  [facility];  Service: Cardiovascular;  Laterality: N/A;   FRACTURE SURGERY     HOLEP-LASER ENUCLEATION OF THE PROSTATE WITH MORCELLATION N/A 07/10/2020   Procedure: HOLEP-LASER ENUCLEATION OF THE PROSTATE WITH MORCELLATION;  Surgeon: Hollice Espy, MD;  Location: [facility] ORS;  Service: Urology;  Laterality: N/A;   INGUINAL HERNIA REPAIR Left 2012   INSERT / REPLACE / REMOVE PACEMAKER  06/19/2016   LAPAROSCOPIC ABLATION RENAL MASS     LEFT HEART CATH AND CORONARY ANGIOGRAPHY Left 10/23/2017   Procedure: LEFT HEART CATH AND CORONARY ANGIOGRAPHY;  Surgeon: Wellington Hampshire, MD;  Location: [place] CV LAB;  Service: Cardiovascular;  Laterality: Left;   PACEMAKER IMPLANT N/A 06/19/2016   Procedure: Pacemaker Implant;  Surgeon: Deboraha Sprang, MD;  Location: [place] CV LAB;  Service: Cardiovascular;  Laterality: N/A;  pacemasker     PROSTATE SURGERY     SQUAMOUS CELL CARCINOMA EXCISION     "face, nose left shoulder, left arm"   TONSILLECTOMY AND ADENOIDECTOMY      There were no vitals filed for this visit.   Subjective Assessment - 02/05/21 1409     Subjective Pt reported he is able to make it to teh bathroom before leakage for the last 5 days.    Pertinent History Lumbar imagining 05/09/19   "L2 compression deformity is again identified. L1 and L3 pedicle  screws are seen with posterior fixation identified. The overall  appearance is stable from the intraoperative exam. Mild degenerative  anterolisthesis of L4 on L5 is seen. Intraoperative spot films of the lumbar spine demonstrate initially  2 posterior spinal needles marking the  T12 and L3 spinous processes."   HOLEP 07/2020    Patient Stated Goals get his back straight and to learn how to deal chronic back ache in teh sacoiliac area and to regain control of his urinary leakage.                [facility] PT Assessment - 02/05/21 1631       Sensation   Additional Comments Bilateral sensation at supra pubic area      Palpation   Spinal mobility tightness at medial/ inferior area of scapula L, intercostals L, platysmus tightness                        Pelvic Floor Special Questions - 02/05/21 1500     External Perineal Exam sensory Q- tip test: equal sensation low pubic and groin B               [facility] Adult PT Treatment/Exercise - 02/05/21 1633       Neuro Re-ed    Neuro Re-ed Details  cued inferior mob of clavicle and platysmus lengthening stretch      Manual Therapy   Manual therapy comments STM/MWM at problem areas noted in assessment to minimze forward head,, gentle manual for cutaneous nn mobilization                          PT Long Term Goals - 02/05/21 1636       PT LONG TERM GOAL #1   Title Pt will complete a chart detailing the method he uses for continence in relationship with activities across each day of the week in order to gather data for baseline    Time 2    Period Weeks    Status Achieved      PT LONG TERM GOAL #2   Title Pt will improve gait mechanics and posture  to increase walking endurance from 20 min to > 30 min in order to walk with less pain    Time 6    Period Weeks    Status Achieved      PT LONG TERM GOAL #3   Title Pt will demo less forward head posture from 30 cm from earlobe to wall to < 25 cm in order to achieve more upright posture to optimize IAP system for postural stability ( less LBP) and urinary continence 12/19/20: 26 cm)    Time 8    Period Weeks    Status Partially Met      PT LONG TERM GOAL #4   Title Pt will demo IND with deep core coordination and pelvic floor  coordination  without compensatory patterns to help with ADLs    Time 4    Period Weeks    Status Achieved      PT LONG TERM GOAL #5   Title Pt will demo less L thoracic shift, less convex curve at T/L junction and more reciprocal gait pattern in order to regain structural midline and to progress to pelvic floor contractions with better outcomes    Time 10    Period Weeks    Status Achieved      PT LONG TERM GOAL #6   Title Pt will report decreased pain by 50% with Bending over, getting out of bed, reaching up overhead activities in order to perform his hobbies and household chores.    Time 8    Period Weeks    Status On-going      PT LONG TERM GOAL #7   Title Pt will demo proper deep core coordination, pelvic floor contractions 3 sec, 3 reps in order to to minimize leakage while gardening    Baseline chest breathing ( limited anterior diaphragmatic breathing)    Time 8    Period Weeks    Status On-going                   Plan - 02/05/21 1631     Clinical Impression Statement Pt has achieved 4/7 goals and progressing well. Pt has made a milestone improvement this week as he reported he is able to make it to the bathroom before leakage for the last 5 days. Pt also noticed less leakage.   Pt's posture has improved significantly with less forward head , thoracic kyphosis , stronger deep core , back extensor mm which has contributed this IAP system for continence. Pt's back surgery have been considered into POC and manual Tx and specific HEP have been applied. Pt continues to benefit from skilled PT.    Personal Factors and Comorbidities Comorbidity 3+    Comorbidities Lumbar fusion L1-3    Examination-Activity Limitations Toileting;Lift;Bed Mobility;Locomotion Level;Bend;Sit;Sleep;Continence;Squat;Stand;Transfers;Reach Overhead    Stability/Clinical Decision Making Evolving/Moderate complexity    Rehab Potential Good    PT Frequency 1x / week    PT Duration Other (comment)    10   PT Treatment/Interventions Moist Heat;Therapeutic activities;Therapeutic exercise;Patient/family education;Neuromuscular re-education;Stair training;Gait training;Manual techniques;Taping;Splinting;Dry needling;Balance training;ADLs/Self Care Home Management;Cryotherapy;Traction;Functional mobility training;Energy conservation;Joint Manipulations;Passive range of motion;Scar mobilization    Consulted and Agree with Plan of Care Patient;Family member/caregiver    Family Member Consulted wife             Patient will benefit from skilled therapeutic intervention in order to improve the following deficits and impairments:  Decreased activity tolerance, Decreased endurance, Decreased range of motion, Decreased strength, Decreased coordination, Decreased mobility, Decreased scar mobility, Abnormal gait, Increased fascial restricitons, Impaired sensation, Improper body mechanics, Pain, Hypermobility, Increased muscle spasms, Hypomobility, Difficulty walking, Decreased knowledge of precautions, Decreased balance, Postural dysfunction, Cardiopulmonary status limiting activity, Decreased safety awareness  Visit Diagnosis: Sacrococcygeal disorders, not elsewhere classified  Other idiopathic scoliosis, thoracolumbar region  Abnormal posture  Other abnormalities of gait and mobility  Other urinary incontinence  Chronic low back pain without sciatica, unspecified back pain laterality     Problem List Patient Active Problem List   Diagnosis Date Noted   Lumbar burst fracture ([place]) 05/04/2020   Hypogonadism male 10/10/2019   Persistent atrial fibrillation ([place]) 01/12/2019   Acute low back pain 11/04/2017   Abnormal screening cardiac CT    Visit for monitoring Tikosyn therapy  09/09/2017   History of stroke 08/29/2016   Paroxysmal atrial fibrillation ([place]) 07/23/2016   Coronary artery disease 07/23/2016   Snores 06/27/2016   Cardiac pacemaker in situ 06/25/2016   Hemiparesis ([place])  06/25/2016   Acute ischemic stroke (HCC)    Acute left hemiparesis (HCC)    Hemisensory loss    Dysarthria    Stroke-like symptoms    Complete heart block (HCC)    Pain in thoracic spine    Orthostasis    Lethargy    Occlusion of vertebral artery    TIA (transient ischemic attack) 06/14/2016   Arthritis 02/06/2015   Esophageal reflux 02/06/2015   Arthritis urica 02/06/2015   Cannot sleep 02/06/2015   Arthritis, degenerative 02/06/2015   Adenocarcinoma, renal cell ([place]) 02/06/2015   Benign prostatic hyperplasia with lower urinary tract symptoms 11/21/2014   Personal history of other malignant neoplasm of kidney 11/21/2014   Palpitations 12/31/2013   Hyperlipidemia 11/02/2010   HYPERTENSION, BENIGN 04/16/2010    Jerl Mina, PT 02/05/2021, 4:37 PM  [place] MAIN [facility] SERVICES [address] [place], [place], [address] Phone: [phone]   Fax:  [phone]  Name: ILAI HILLER MRN: [id number] Date of Birth: 24-Mar-1933

## 2021-02-05 NOTE — Patient Instructions (Signed)
Gentle pull of clavicle down, chin up, stick tongue out Shoulders down   5 reps throughout day while laying down

## 2021-02-07 NOTE — Progress Notes (Signed)
Remote pacemaker transmission.   

## 2021-02-12 ENCOUNTER — Other Ambulatory Visit: Payer: Self-pay

## 2021-02-12 ENCOUNTER — Ambulatory Visit: Payer: Medicare Other | Admitting: Physical Therapy

## 2021-02-12 DIAGNOSIS — M533 Sacrococcygeal disorders, not elsewhere classified: Secondary | ICD-10-CM | POA: Diagnosis not present

## 2021-02-12 DIAGNOSIS — R293 Abnormal posture: Secondary | ICD-10-CM | POA: Diagnosis not present

## 2021-02-12 DIAGNOSIS — G8929 Other chronic pain: Secondary | ICD-10-CM | POA: Diagnosis not present

## 2021-02-12 DIAGNOSIS — N39498 Other specified urinary incontinence: Secondary | ICD-10-CM

## 2021-02-12 DIAGNOSIS — M545 Low back pain, unspecified: Secondary | ICD-10-CM | POA: Diagnosis not present

## 2021-02-12 DIAGNOSIS — R2689 Other abnormalities of gait and mobility: Secondary | ICD-10-CM

## 2021-02-12 DIAGNOSIS — M4125 Other idiopathic scoliosis, thoracolumbar region: Secondary | ICD-10-CM

## 2021-02-12 NOTE — Patient Instructions (Signed)
Band over door,  Chest rows  Shoulders squeezing down and back   Slow  20 reps   __  Band behind shoulder, Pull apart as you side step  20 reps = 1 rep of L/R) )   __

## 2021-02-12 NOTE — Therapy (Addendum)
[place] MAIN [facility] SERVICES [address] [place], [place], [address] Phone: [phone]   Fax:  [phone]  Physical Therapy Treatment  Patient Details  Name: Thomas [name] MRN: [id number] Date of Birth: 1932/06/19 Referring Provider (PT): Dawley and Erlene Quan   Encounter Date: 02/12/2021   PT End of Session - 02/12/21 1456     Visit Number 11    Date for PT Re-Evaluation 02/27/21   09/18/20, PN 02/05/21   PT Start Time 1300    PT Stop Time 1400    PT Time Calculation (min) 60 min    Activity Tolerance Patient tolerated treatment well;No increased pain    Behavior During Therapy WFL for tasks assessed/performed             Past Medical History:  Diagnosis Date   Aortic valve disorders    Arthritis    Atrial flutter ([place]) 06/18/2016   "AF or AFl; not sure which" (06/23/2016)   Basal cell carcinoma    "face, nose left shoulder, left arm" (06/19/2016)   Basal cell carcinoma 09/13/2020   right temple   BBB (bundle branch block)    hx right   Chronic back pain    "neck, thoracic, lower back" (06/19/2016)   Complete heart block ([place]) 06/2016   Dyspnea    GERD (gastroesophageal reflux disease)    Gout    Heart block    "I've had type I, II Wenke before now" (06/19/2016)   History of gout    History of hiatal hernia    "self dx'd" (06/19/2016)   Hyperlipidemia    Hypertension    Lyme disease    "dx'd by me 2003; cx's showed dx 08/2015"   Migraine    "3-4/year" (06/19/2016)   Presence of permanent cardiac pacemaker 06/19/2016   PVC's (premature ventricular contractions)    Renal cancer, left ([place]) 2006   S/P cryotherapy   Spinal stenosis    "cervical, 1 thoracic, lumbar" (06/19/2016)   Squamous carcinoma    "face, nose left shoulder, left arm" (06/19/2016)   Stroke ([place])    TIA (transient ischemic attack) 06/14/2016   "I'm not sure that's what it was" (06/25/2016)   Visit for monitoring Tikosyn therapy 09/09/2017    Past  Surgical History:  Procedure Laterality Date   ANKLE FRACTURE SURGERY Right 1967   BACK SURGERY  05/07/2020   BASAL CELL CARCINOMA EXCISION     "face, nose left shoulder, left arm"   BIOPSY PROSTATE  2001 & 2003   CARDIAC CATHETERIZATION  1990's   CARDIOVERSION N/A 09/11/2017   Procedure: CARDIOVERSION;  Surgeon: Lelon Perla, MD;  Location: [place] ENDOSCOPY;  Service: Cardiovascular;  Laterality: N/A;   FRACTURE SURGERY     HOLEP-LASER ENUCLEATION OF THE PROSTATE WITH MORCELLATION N/A 07/10/2020   Procedure: HOLEP-LASER ENUCLEATION OF THE PROSTATE WITH MORCELLATION;  Surgeon: Hollice Espy, MD;  Location: [facility] ORS;  Service: Urology;  Laterality: N/A;   INGUINAL HERNIA REPAIR Left 2012   INSERT / REPLACE / REMOVE PACEMAKER  06/19/2016   LAPAROSCOPIC ABLATION RENAL MASS     LEFT HEART CATH AND CORONARY ANGIOGRAPHY Left 10/23/2017   Procedure: LEFT HEART CATH AND CORONARY ANGIOGRAPHY;  Surgeon: Wellington Hampshire, MD;  Location: [place] CV LAB;  Service: Cardiovascular;  Laterality: Left;   PACEMAKER IMPLANT N/A 06/19/2016   Procedure: Pacemaker Implant;  Surgeon: Deboraha Sprang, MD;  Location: [place] CV LAB;  Service: Cardiovascular;  Laterality: N/A;   pacemasker  PROSTATE SURGERY     SQUAMOUS CELL CARCINOMA EXCISION     "face, nose left shoulder, left arm"   TONSILLECTOMY AND ADENOIDECTOMY      There were no vitals filed for this visit.   Subjective Assessment - 02/12/21 1406     Subjective Pt reported he is able to complete urinate with less straining. Sensation is sllowing returning    Pertinent History Lumbar imagining 05/09/19   "L2 compression deformity is again identified. L1 and L3 pedicle  screws are seen with posterior fixation identified. The overall  appearance is stable from the intraoperative exam. Mild degenerative  anterolisthesis of L4 on L5 is seen. Intraoperative spot films of the lumbar spine demonstrate initially  2 posterior spinal needles marking the  T12 and L3 spinous processes."   HOLEP 07/2020    Patient Stated Goals get his back straight and to learn how to deal chronic back ache in teh sacoiliac area and to regain control of his urinary leakage.                [facility] PT Assessment - 02/12/21 1458       Palpation   Spinal mobility tightness along B  pects, intercostals, T 3, anterior/ lateral cervical mm                           [facility] Adult PT Treatment/Exercise - 02/12/21 1456       Neuro Re-ed    Neuro Re-ed Details  cued for cervical movements, cued for technique for chest rows and scapular stabilization with hip abduction resistance bands      Modalities   Modalities Moist Heat      Moist Heat Therapy   Number Minutes Moist Heat 5 Minutes    Moist Heat Location Other (comment)   cervical/ thoracic     Manual Therapy   Manual therapy comments STM/MWM at problem areas noted in assessment to minimze forward head,,                          PT Long Term Goals - 02/05/21 1636       PT LONG TERM GOAL #1   Title Pt will complete a chart detailing the method he uses for continence in relationship with activities across each day of the week in order to gather data for baseline    Time 2    Period Weeks    Status Achieved      PT LONG TERM GOAL #2   Title Pt will improve gait mechanics and posture  to increase walking endurance from 20 min to > 30 min in order to walk with less pain    Time 6    Period Weeks    Status Achieved      PT LONG TERM GOAL #3   Title Pt will demo less forard head posture from 30 cm from earlobe to wall to < 25 cm in order to achieve more upright posture to optimize IAP system for postural stability ( less LBP) and urinary continence 12/19/20: 26 cm)    Time 8    Period Weeks    Status Partially Met      PT LONG TERM GOAL #4   Title Pt will demo IND with deep core coordination and pelvic floor coordination without compensatory patterns to help with ADLs     Time 4    Period Weeks  Status Achieved      PT LONG TERM GOAL #5   Title Pt will demo less L thoracic shift, less convex curve at T/L junction and more reciprocal gait pattern in order to regain structural midline and to progress to pelvic floor contractions with better outcomes    Time 10    Period Weeks    Status Achieved      PT LONG TERM GOAL #6   Title Pt will report decreased pain by 50% with Bending over, getting out of bed, reaching up overhead activities in order to perform his hobbies and household chores.    Time 8    Period Weeks    Status On-going      PT LONG TERM GOAL #7   Title Pt will demo proper deep core coordination, pelvic floor contractions 3 sec, 3 reps in order to to minimize leakage while gardening    Baseline chest breathing ( limited anterior diaphragmatic breathing)    Time 8    Period Weeks    Status On-going                   Plan - 02/12/21 1456     Clinical Impression Statement Pt is reporting less straining with urination and increased sensation to urinate.   Pt continued to require more manual Tx to minimize forward head posture, decrease thoracic kyphosis where hypo-iliogastric/ ilioguinal nn innervate bladder. Anticipate as pt continues to improve, pt's mobility and bladder Sx will continue to improve. Provided cues for new resistance band upright exercises to promote posterior strengthening for cervical/ scapular stabilization. Pt continues to benefit from skilled PT.     Personal Factors and Comorbidities Comorbidity 3+    Comorbidities Lumbar fusion L1-3    Examination-Activity Limitations Toileting;Lift;Bed Mobility;Locomotion Level;Bend;Sit;Sleep;Continence;Squat;Stand;Transfers;Reach Overhead    Stability/Clinical Decision Making Evolving/Moderate complexity    Rehab Potential Good    PT Frequency 1x / week    PT Duration Other (comment)   10   PT Treatment/Interventions Moist Heat;Therapeutic activities;Therapeutic  exercise;Patient/family education;Neuromuscular re-education;Stair training;Gait training;Manual techniques;Taping;Splinting;Dry needling;Balance training;ADLs/Self Care Home Management;Cryotherapy;Traction;Functional mobility training;Energy conservation;Joint Manipulations;Passive range of motion;Scar mobilization    Consulted and Agree with Plan of Care Patient;Family member/caregiver    Family Member Consulted wife             Patient will benefit from skilled therapeutic intervention in order to improve the following deficits and impairments:  Decreased activity tolerance, Decreased endurance, Decreased range of motion, Decreased strength, Decreased coordination, Decreased mobility, Decreased scar mobility, Abnormal gait, Increased fascial restricitons, Impaired sensation, Improper body mechanics, Pain, Hypermobility, Increased muscle spasms, Hypomobility, Difficulty walking, Decreased knowledge of precautions, Decreased balance, Postural dysfunction, Cardiopulmonary status limiting activity, Decreased safety awareness  Visit Diagnosis: Other idiopathic scoliosis, thoracolumbar region  Abnormal posture  Other abnormalities of gait and mobility  Other urinary incontinence  Chronic low back pain without sciatica, unspecified back pain laterality  Sacrococcygeal disorders, not elsewhere classified     Problem List Patient Active Problem List   Diagnosis Date Noted   Lumbar burst fracture ([place]) 05/04/2020   Hypogonadism male 10/10/2019   Persistent atrial fibrillation ([place]) 01/12/2019   Acute low back pain 11/04/2017   Abnormal screening cardiac CT    Visit for monitoring Tikosyn therapy 09/09/2017   History of stroke 08/29/2016   Paroxysmal atrial fibrillation ([place]) 07/23/2016   Coronary artery disease 07/23/2016   Snores 06/27/2016   Cardiac pacemaker in situ 06/25/2016   Hemiparesis ([place]) 06/25/2016   Acute ischemic  stroke ([place])    Acute left hemiparesis (HCC)     Hemisensory loss    Dysarthria    Stroke-like symptoms    Complete heart block (HCC)    Pain in thoracic spine    Orthostasis    Lethargy    Occlusion of vertebral artery    TIA (transient ischemic attack) 06/14/2016   Arthritis 02/06/2015   Esophageal reflux 02/06/2015   Arthritis urica 02/06/2015   Cannot sleep 02/06/2015   Arthritis, degenerative 02/06/2015   Adenocarcinoma, renal cell ([place]) 02/06/2015   Benign prostatic hyperplasia with lower urinary tract symptoms 11/21/2014   Personal history of other malignant neoplasm of kidney 11/21/2014   Palpitations 12/31/2013   Hyperlipidemia 11/02/2010   HYPERTENSION, BENIGN 04/16/2010    Jerl Mina, PT 02/12/2021, 3:01 PM  [place] MAIN [facility] SERVICES [address] [place], [place], [address] Phone: [phone]   Fax:  [phone]  Name: Thomas [name] MRN: [id number] Date of Birth: [birth year]

## 2021-02-15 ENCOUNTER — Other Ambulatory Visit: Payer: Self-pay

## 2021-02-15 ENCOUNTER — Encounter: Payer: Self-pay | Admitting: Physician Assistant

## 2021-02-15 ENCOUNTER — Other Ambulatory Visit
Admission: RE | Admit: 2021-02-15 | Discharge: 2021-02-15 | Disposition: A | Discharge status: Home / Self Care | Payer: Medicare Other | Attending: Physician Assistant | Admitting: Physician Assistant

## 2021-02-15 ENCOUNTER — Telehealth: Payer: Self-pay | Admitting: Internal Medicine

## 2021-02-15 ENCOUNTER — Ambulatory Visit: Payer: Medicare Other | Admitting: Physician Assistant

## 2021-02-15 VITALS — BP 102/72 | HR 64 | Ht 67.0 in | Wt 154.0 lb

## 2021-02-15 DIAGNOSIS — I4819 Other persistent atrial fibrillation: Secondary | ICD-10-CM | POA: Diagnosis not present

## 2021-02-15 DIAGNOSIS — R0609 Other forms of dyspnea: Secondary | ICD-10-CM | POA: Diagnosis not present

## 2021-02-15 DIAGNOSIS — Z79899 Other long term (current) drug therapy: Secondary | ICD-10-CM | POA: Diagnosis not present

## 2021-02-15 DIAGNOSIS — I25118 Atherosclerotic heart disease of native coronary artery with other forms of angina pectoris: Secondary | ICD-10-CM

## 2021-02-15 DIAGNOSIS — I442 Atrioventricular block, complete: Secondary | ICD-10-CM | POA: Diagnosis not present

## 2021-02-15 DIAGNOSIS — E785 Hyperlipidemia, unspecified: Secondary | ICD-10-CM

## 2021-02-15 DIAGNOSIS — Z950 Presence of cardiac pacemaker: Secondary | ICD-10-CM | POA: Diagnosis not present

## 2021-02-15 DIAGNOSIS — R0789 Other chest pain: Secondary | ICD-10-CM

## 2021-02-15 DIAGNOSIS — I10 Essential (primary) hypertension: Secondary | ICD-10-CM | POA: Diagnosis not present

## 2021-02-15 DIAGNOSIS — I429 Cardiomyopathy, unspecified: Secondary | ICD-10-CM | POA: Diagnosis not present

## 2021-02-15 DIAGNOSIS — N1832 Chronic kidney disease, stage 3b: Secondary | ICD-10-CM | POA: Diagnosis not present

## 2021-02-15 DIAGNOSIS — R079 Chest pain, unspecified: Secondary | ICD-10-CM | POA: Diagnosis not present

## 2021-02-15 LAB — BASIC METABOLIC PANEL
Anion gap: 8 (ref 5–15)
BUN: 34 mg/dL — ABNORMAL HIGH (ref 8–23)
CO2: 28 mmol/L (ref 22–32)
Calcium: 9.7 mg/dL (ref 8.9–10.3)
Chloride: 98 mmol/L (ref 98–111)
Creatinine, Ser: 1.43 mg/dL — ABNORMAL HIGH (ref 0.61–1.24)
GFR, Estimated: 48 mL/min — ABNORMAL LOW (ref >60)
Glucose, Bld: 101 mg/dL — ABNORMAL HIGH (ref 70–99)
Potassium: 4.8 mmol/L (ref 3.5–5.1)
Sodium: 134 mmol/L — ABNORMAL LOW (ref 135–145)

## 2021-02-15 LAB — CBC
HCT: 44.9 % (ref 39.0–52.0)
Hemoglobin: 14.9 g/dL (ref 13.0–17.0)
MCH: 29.6 pg (ref 26.0–34.0)
MCHC: 33.2 g/dL (ref 30.0–36.0)
MCV: 89.1 fL (ref 80.0–100.0)
Platelets: 166 10*3/uL (ref 150–400)
RBC: 5.04 MIL/uL (ref 4.22–5.81)
RDW: 14.5 % (ref 11.5–15.5)
WBC: 6.2 10*3/uL (ref 4.0–10.5)
nRBC: 0 % (ref 0.0–0.2)

## 2021-02-15 LAB — TROPONIN I (HIGH SENSITIVITY): Troponin I (High Sensitivity): 4 ng/L (ref <18)

## 2021-02-15 NOTE — Telephone Encounter (Signed)
Incoming triage call received.  Patient called to report intermittent chest tightness and a feeling like he is unable to get in a a good deep breath. Patient sts that his symptoms started yesterday. He thought his symptoms were related to deconditioning so he decide to walk on the treadmill. He was able to ambulate on the treadmill for 15 minutes. His symptoms then became worse.  Patient also reports systolic bps that are low for him 100-100's. He normally runs 130-140's while on his antihypertensive medications. He has held his medication for a couple of days and his bps remain low.  Patient denies n/v or diaphoresis. He doesn't feel that the chest tightness is anginal equivalent, but he reports that his symptoms increase when he moves around.  Advised the patient that we do have an opening this afternoon with Christell Faith, PA @ 3:30 pm. Patient would like to be seen. Appt scheduled. Patient voiced appreciation for the assistance.

## 2021-02-15 NOTE — Progress Notes (Signed)
Cardiology Office Note    Date:  02/15/2021   ID:  Thomas [name], DOB 03-Dec-1932, MRN [id number]  PCP:  Jerrol Banana., MD  Cardiologist:  [name], MD  Electrophysiologist:  Virl Axe, MD   Chief Complaint: Exertional chest tightness, dyspnea, and generalized fatigue for 1 week  History of Present Illness:   Thomas [name] is a 87 y.o. 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 fib on apixaban, cardiomyopathy, TIA, CKD stage III with partial nephrectomy due to cancer, HTN, HLD, and GERD who presents for evaluation of a 1 week history of exertional chest tightness, dyspnea, and generalized fatigue.  Echo in 08/2017 showed a low normal LV systolic function with an EF of 50 to 55% with anterior septal wall motion consistent with paced rhythm.  Coronary CTA in 09/2017 showed a calcium score of 1366 with possible significant stenosis in the mid LAD and a 50% stenosis in the mid LCx.  The RCA could not be visualized due to pacemaker artifact.  LHC in 10/2017 showed borderline three-vessel CAD with 60% mid LAD stenosis, 50% proximal LCx stenosis, and 70% mid RCA stenosis.  The coronary arteries were noted to be moderately calcified overall.  LVEDP was 18 mmHg.  No revascularization was performed.  He has not required ischemic evaluation since.  Echo from 02/2019 showed a slight drop in his LV systolic function with an EF of 45 to 50% with global hypokinesis.  Most recent echo from 03/2020 showed a stable EF of 45 to 50%, global hypokinesis, mild LVH, grade 1 diastolic dysfunction, low normal RV systolic function with normal ventricular cavity size, normal PASP, mildly dilated right atrium, mild to moderate mitral valve regurgitation, mild aortic valve insufficiency, mild to moderate aortic valve sclerosis without evidence of stenosis, and an estimated right atrial pressure of 3 mmHg.  He comes in  accompanied by his wife today.  He notes over the past week he has been experiencing exertional chest tightness, dyspnea, and generalized fatigue/malaise with increased somnolence.  No syncope.  No frank angina/chest pain.  He does not believe these symptoms feel similar to his chest pain that he was experiencing in 2019, though does state they feel similar to what he was experiencing with rheumatic fever when he was 87 years old.  In the timeline of the above, over the past couple of days he has noted his blood pressure has been running in the low 100s when his baseline is typically in the 856D to 149F systolic.  He has not taken any antihypertensive medications over the past couple of days, though continues to note relative hypotension.  He has not missed any doses of Tikosyn or apixaban.  No falls, hematochezia, or melena.  No lower extremity swelling, abdominal distention, orthopnea, or early satiety.  At rest in the exam room, he is largely asymptomatic.  He did note exertional shortness of breath and fatigue with ambulation in to the office today, though this is unchanged from what he has been experiencing over the past week.  His weight is stable by our scale.   Labs independently reviewed: 11/2020 - BUN 29, serum creatinine 1.55, potassium 5.2, magnesium 2.3 10/2020 - TSH normal, TC 125, TG 69, HDL 41, LDL 70, albumin 3.8, AST/ALT normal, Hgb 13.6, PLT 229  Past Medical History:  Diagnosis Date   Aortic valve disorders    Arthritis    Atrial  flutter ([place]) 06/18/2016   "AF or AFl; not sure which" (06/23/2016)   Basal cell carcinoma    "face, nose left shoulder, left arm" (06/19/2016)   Basal cell carcinoma 09/13/2020   right temple   BBB (bundle branch block)    hx right   Chronic back pain    "neck, thoracic, lower back" (06/19/2016)   Complete heart block ([place]) 06/2016   Dyspnea    GERD (gastroesophageal reflux disease)    Gout    Heart block    "I've had type I, II Wenke before now"  (06/19/2016)   History of gout    History of hiatal hernia    "self dx'd" (06/19/2016)   Hyperlipidemia    Hypertension    Lyme disease    "dx'd by me 2003; cx's showed dx 08/2015"   Migraine    "3-4/year" (06/19/2016)   Presence of permanent cardiac pacemaker 06/19/2016   PVC's (premature ventricular contractions)    Renal cancer, left ([place]) 2006   S/P cryotherapy   Spinal stenosis    "cervical, 1 thoracic, lumbar" (06/19/2016)   Squamous carcinoma    "face, nose left shoulder, left arm" (06/19/2016)   Stroke ([place])    TIA (transient ischemic attack) 06/14/2016   "I'm not sure that's what it was" (06/25/2016)   Visit for monitoring Tikosyn therapy 09/09/2017    Past Surgical History:  Procedure Laterality Date   ANKLE FRACTURE SURGERY Right 1967   BACK SURGERY  05/07/2020   BASAL CELL CARCINOMA EXCISION     "face, nose left shoulder, left arm"   BIOPSY PROSTATE  2001 & 2003   CARDIAC CATHETERIZATION  1990's   CARDIOVERSION N/A 09/11/2017   Procedure: CARDIOVERSION;  Surgeon: Lelon Perla, MD;  Location: [facility];  Service: Cardiovascular;  Laterality: N/A;   FRACTURE SURGERY     HOLEP-LASER ENUCLEATION OF THE PROSTATE WITH MORCELLATION N/A 07/10/2020   Procedure: HOLEP-LASER ENUCLEATION OF THE PROSTATE WITH MORCELLATION;  Surgeon: Hollice Espy, MD;  Location: [facility];  Service: Urology;  Laterality: N/A;   INGUINAL HERNIA REPAIR Left 2012   INSERT / REPLACE / REMOVE PACEMAKER  06/19/2016   LAPAROSCOPIC ABLATION RENAL MASS     LEFT HEART CATH AND CORONARY ANGIOGRAPHY Left 10/23/2017   Procedure: LEFT HEART CATH AND CORONARY ANGIOGRAPHY;  Surgeon: Wellington Hampshire, MD;  Location: [facility];  Service: Cardiovascular;  Laterality: Left;   PACEMAKER IMPLANT N/A 06/19/2016   Procedure: Pacemaker Implant;  Surgeon: Deboraha Sprang, MD;  Location: [facility];  Service: Cardiovascular;  Laterality: N/A;   pacemasker     PROSTATE SURGERY     SQUAMOUS CELL  CARCINOMA EXCISION     "face, nose left shoulder, left arm"   TONSILLECTOMY AND ADENOIDECTOMY      Current Medications: Current Meds  Medication Sig   amLODipine (NORVASC) 5 MG tablet TAKE 1 TABLET BY MOUTH DAILY   apixaban (ELIQUIS) 5 MG TABS tablet Take 1 tablet (5 mg total) by mouth 2 (two) times daily.   apixaban (ELIQUIS) 5 MG TABS tablet Take 1 tablet (5 mg total) by mouth 2 (two) times daily.   atorvastatin (LIPITOR) 40 MG tablet Take 1 tablet (40 mg total) by mouth daily.   calcium carbonate (TUMS - DOSED IN MG ELEMENTAL CALCIUM) 500 MG chewable tablet Chew 1-2 tablets by mouth daily as needed for indigestion.   Cholecalciferol (VITAMIN D3) 5000 units CAPS Take 5,000 Units by mouth daily.   Coenzyme Q10 (COQ10)  100 MG CAPS Take 100 mg by mouth daily.   Cyanocobalamin (VITAMIN B-12 PO) Take 3,000 mcg by mouth daily with breakfast.   dofetilide (TIKOSYN) 125 MCG capsule Take 1 capsule (125 mcg total) by mouth 2 (two) times daily.   febuxostat (ULORIC) 40 MG tablet Take 1 tablet (40 mg total) by mouth daily.   gabapentin (NEURONTIN) 300 MG capsule Take 300 mg by mouth 2 (two) times daily.   Magnesium 500 MG TABS Take 500 mg by mouth at bedtime.   methocarbamol (ROBAXIN) 750 MG tablet Take 750 mg by mouth 2 times daily at 12 noon and 4 pm.   metoprolol succinate (TOPROL-XL) 50 MG 24 hr tablet TAKE 1 TABLET BY MOUTH DAILY WITH OR IMMEDIATELY FOLLOWING A MEAL   metroNIDAZOLE (FLAGYL) 500 MG tablet TAKE 1 TABLET BY MOUTH TWICE DAILY ON SUNDAY AND MONDAY (Patient taking differently: TAKE 1 TABLET BY MOUTH TWICE DAILY ON  EVERY OTHER SUNDAY AND MONDAY)   mirabegron ER (MYRBETRIQ) 25 MG TB24 tablet Take 1 tablet (25 mg total) by mouth daily.   Multiple Vitamins-Minerals (PRESERVISION/LUTEIN PO) Take 1 capsule by mouth in the morning.   NEEDLE, DISP, 18 G (BD DISP NEEDLES) 18G X 1-1/2" MISC 1 mg by Does not apply route every 14 (fourteen) days.   NEEDLE, DISP, 21 G (BD DISP NEEDLES) 21G X  1-1/2" MISC 1 mg by Does not apply route every 14 (fourteen) days.   oxyCODONE (OXY IR/ROXICODONE) 5 MG immediate release tablet Take 1 tablet (5 mg total) by mouth every 6 (six) hours as needed for moderate pain or breakthrough pain.   oxyCODONE-acetaminophen (PERCOCET) 7.5-325 MG tablet Take 1-2 tablets by mouth every 4 (four) hours as needed for severe pain.   polyethylene glycol (MIRALAX / GLYCOLAX) 17 g packet Take 17 g by mouth as needed.   propranolol (INDERAL) 40 MG tablet TAKE ONE (1) TABLET THREE (3) TIMES EACHDAY AS NEEDED   senna (SENOKOT) 8.6 MG tablet Take 1 tablet by mouth 2 (two) times daily.   Syringe, Disposable, (2-3CC SYRINGE) 3 ML MISC 1 mg by Does not apply route every 14 (fourteen) days.   tamsulosin (FLOMAX) 0.4 MG CAPS capsule Take 0.4 mg by mouth daily.   Testosterone 20.25 MG/ACT (1.62%) GEL APPLY 2 PUMPS DAILY.   testosterone cypionate (DEPOTESTOSTERONE CYPIONATE) 200 MG/ML injection Inject 1 mL (200 mg total) into the muscle every 28 (twenty-eight) days.   traMADol (ULTRAM) 50 MG tablet Take 50-100 mg by mouth 4 (four) times daily as needed.   Turmeric 500 MG CAPS Take 500 mg by mouth daily.    Allergies:   Other, Iodine, and Penicillins   Social History   Socioeconomic History   Marital status: Married    Spouse name: Not on file   Number of children: 3   Years of education: MD    Highest education level: Master's degree (e.g., MA, MS, MEng, MEd, MSW, MBA)  Occupational History   Occupation: Retired   Tobacco Use   Smoking status: Never   Smokeless tobacco: Never  Vaping Use   Vaping Use: Never used  Substance and Sexual Activity   Alcohol use: Yes    Alcohol/week: 1.0 standard drink    Types: 1 Glasses of wine per week   Drug use: No   Sexual activity: Yes  Other Topics Concern   Not on file  Social History Narrative   Independent at baseline. Lives with family   Drinks tea in the morning  Social Determinants of Health   Financial Resource  Strain: Not on file  Food Insecurity: Not on file  Transportation Needs: Not on file  Physical Activity: Not on file  Stress: Not on file  Social Connections: Not on file     Family History:  The patient's family history includes Aortic stenosis in his mother; Arthritis in his father; Heart attack in his brother; Stroke in his brother.  ROS:   Review of Systems  Constitutional:  Positive for malaise/fatigue. Negative for chills, diaphoresis, fever and weight loss.  HENT:  Negative for congestion.   Eyes:  Negative for discharge and redness.  Respiratory:  Positive for shortness of breath. Negative for cough, sputum production and wheezing.   Cardiovascular:  Negative for chest pain, palpitations, orthopnea, claudication, leg swelling and PND.       Chest tightness  Gastrointestinal:  Negative for blood in stool and melena.  Musculoskeletal:  Negative for falls and myalgias.  Skin:  Negative for rash.  Neurological:  Positive for weakness. Negative for dizziness, tingling, tremors, sensory change, speech change, focal weakness and loss of consciousness.  Endo/Heme/Allergies:  Does not bruise/bleed easily.  Psychiatric/Behavioral:  Negative for substance abuse. The patient is not nervous/anxious.   All other systems reviewed and are negative.   EKGs/Labs/Other Studies Reviewed:    Studies reviewed were summarized above. The additional studies were reviewed today:  2D echo 03/2020: 1. Left ventricular ejection fraction, by estimation, is 45 to 50%. The  left ventricle has mildly decreased function. The left ventricle  demonstrates global hypokinesis. There is mild left ventricular  hypertrophy. Left ventricular diastolic parameters  are consistent with Grade I diastolic dysfunction (impaired relaxation).  Elevated left atrial pressure. The average left ventricular global  longitudinal strain is -9.9 %. The global longitudinal strain is abnormal.   2. Right ventricular systolic  function is low normal. The right  ventricular size is normal. There is normal pulmonary artery systolic  pressure.   3. Right atrial size was mildly dilated.   4. The mitral valve is normal in structure. Mild to moderate mitral valve  regurgitation. No evidence of mitral stenosis.   5. The aortic valve is tricuspid. There is mild calcification of the  aortic valve. There is mild thickening of the aortic valve. Aortic valve  regurgitation is mild. Mild to moderate aortic valve  sclerosis/calcification is present, without any evidence  of aortic stenosis.   6. Pulmonic valve regurgitation not well assessed.   7. The inferior vena cava is normal in size with greater than 50%  respiratory variability, suggesting right atrial pressure of 3 mmHg. __________  2D echo 02/2019: 1. Left ventricular ejection fraction, by visual estimation, is 45 to  50%. The left ventricle has mildly decreased function. Mild global  hypokinesis. Normal left ventricular size. There is mildly increased left  ventricular hypertrophy.   2. Left ventricular diastolic Doppler parameters are consistent with  impaired relaxation pattern of LV diastolic filling.   3. Global right ventricle has normal systolic function.The right  ventricular size is normal. No increase in right ventricular wall  thickness.   4. Mildly elevated pulmonary artery systolic pressure.   5. The tricuspid regurgitant velocity is 2.70 m/s, and with an assumed  right atrial pressure of 10 mmHg, the estimated right ventricular systolic  pressure is mildly elevated at 39.2 mmHg.   6. Left atrial size was mildly dilated.   7. Moderate mitral valve regurgitation.   8. Tricuspid valve regurgitation is mild.  9. Aortic valve regurgitation is mild. Moderate aortic valve  sclerosis/calcification without any evidence of aortic stenosis.  10. A pacer wire is visualized.  __________  LHC 10/2017:  Prox LAD lesion is 30% stenosed. Prox LAD to Mid LAD  lesion is 60% stenosed. Prox Cx lesion is 50% stenosed. Ost 2nd Mrg to 2nd Mrg lesion is 40% stenosed. Dist RCA lesion is 40% stenosed with 40% stenosed side branch in Post Atrio. Prox RCA lesion is 30% stenosed. Mid RCA lesion is 70% stenosed.   1.  Borderline three-vessel coronary artery disease with 60% mid LAD stenosis, 50% proximal left circumflex stenosis and 70% mid RCA stenosis.  The coronary arteries are overall moderately to heavily calcified.  The mid LAD stenosis is just after the origin of a large diagonal branch. 2.  Left ventricular angiography was not performed due to mild chronic kidney disease. 3.  Mildly elevated left ventricular end-diastolic pressure at 18 to 20 mmHg.  Recommendations:   Recommendations: Recommend aggressive medical therapy.  The patient has minimal symptoms at the present time.  Revascularization of the RCA and possibly the LAD can be considered for significant anginal symptoms. __________  2D echo 08/2017: - Left ventricle: The cavity size was normal. Systolic function was    normal. The estimated ejection fraction was in the range of 50%    to 55%. Anteroseptal wall motion abnormality consistent with    paced rhythm. The study is not technically sufficient to allow    evaluation of LV diastolic function.  - Aortic valve: There was trivial regurgitation. Mean gradient (S):    5 mm Hg.  - Mitral valve: There was mild to moderate regurgitation.  - Left atrium: The atrium was moderately dilated.  - Right ventricle: The cavity size was mildly dilated. Wall    thickness was normal. Systolic function was mildly reduced.  - Pulmonary arteries: Systolic pressure was mildly elevated. PA    peak pressure: 41 mm Hg (S).  __________  Coronary CTA with FFR 09/2017: 1. Coronary artery calcium score 1366 Agatston units places the patient at the 74th percentile for age and gender. This suggests intermediate risk for future cardiac events.   2.  The RCA is  uninterpretable due to pacemaker artifact.   3.  Up to 50% stenosis in the mid LCx.   4.  I am concerned for severe stenosis in the mid LAD after D2.   I will send for FFR of the LAD, but study may be rejected because of the uninterpretable RCA.   FFR 0.72 mid LAD.   FFR 0.81 distal LCx.   IMPRESSION: 1.  Hemodynamically significant mid LAD stenosis. 2.  Probably not hemodynamically significant mid LCx stenosis. __________  Limited echo 06/2016: - Left ventricle: The cavity size was normal. Wall thickness was    normal. Systolic function was normal. The estimated ejection    fraction was in the range of 55% to 60%. Wall motion was normal;    there were no regional wall motion abnormalities. Doppler    parameters are consistent with abnormal left ventricular    relaxation (grade 1 diastolic dysfunction).  - Aortic valve: Sclerosis without stenosis.  - Aorta: Mildly dilated aortic root. Aortic root dimension: 39 mm    (ED).  - Right ventricle: The cavity size was normal. Pacer wire or    catheter noted in right ventricle. Systolic function was normal.  - Pulmonary arteries: No complete TR doppler jet so unable to    estimate  PA systolic pressure.  - Inferior vena cava: The vessel was normal in size. The    respirophasic diameter changes were in the normal range (>= 50%),    consistent with normal central venous pressure.   Impressions:   - Normal LV size and systolic function, EF 65-99%. Normal RV size    and systolic function. Aortic sclerosis without significant    stenosis.  __________  Echo with bubble study 06/2016: - Left ventricle: The cavity size was normal. Systolic function was    normal. The estimated ejection fraction was in the range of 55%    to 60%. Wall motion was normal; there were no regional wall    motion abnormalities. The study is not technically sufficient to    allow evaluation of LV diastolic function.  - Aortic valve: There was trivial  regurgitation.  - Mitral valve: There was mild regurgitation.  - Right ventricle: The cavity size was mildly dilated. Wall    thickness was normal. Systolic function was mildly reduced.  - Atrial septum: No defect or patent foramen ovale was identified.    Echo contrast study showed no right-to-left atrial level shunt,    at baseline or with provocation.  - Pulmonary arteries: Systolic pressure was mild to moderately    dilated. PA peak pressure: 46 mm Hg (S).   Impressions:   - No cardiac source of emboli was indentified. Rhythm is complete    heart block with RBBB.    EKG:  EKG is ordered today.  The EKG ordered today demonstrates AV paced rhythm, 64 bpm  Recent Labs: 11/01/2020: ALT 17; TSH 1.460 11/21/2020: Magnesium 2.3 02/15/2021: BUN 34; Creatinine, Ser 1.43; Hemoglobin 14.9; Platelets 166; Potassium 4.8; Sodium 134  Recent Lipid Panel    Component Value Date/Time   CHOL 125 11/01/2020 1054   CHOL 144 06/26/2012 1106   TRIG 69 11/01/2020 1054   TRIG 46 06/26/2012 1106   HDL 41 11/01/2020 1054   HDL 61 (H) 06/26/2012 1106   CHOLHDL 3.0 11/01/2020 1054   CHOLHDL 2.4 06/14/2016 1346   VLDL 8 06/14/2016 1346   VLDL 9 06/26/2012 1106   LDLCALC 70 11/01/2020 1054   LDLCALC 74 06/26/2012 1106    PHYSICAL EXAM:    VS:  BP 102/72 (BP Location: Left Arm, Patient Position: Sitting, Cuff Size: Normal)   Pulse 64   Ht 5\' 7"  (1.702 m)   Wt 154 lb (69.9 kg)   SpO2 95%   BMI 24.12 kg/m   BMI: Body mass index is 24.12 kg/m.  Physical Exam Vitals reviewed.  Constitutional:      Appearance: He is well-developed.  HENT:     Head: Normocephalic and atraumatic.  Eyes:     General:        Right eye: No discharge.        Left eye: No discharge.  Neck:     Vascular: No JVD.  Cardiovascular:     Rate and Rhythm: Normal rate and regular rhythm.     Pulses:          Posterior tibial pulses are 2+ on the right side and 2+ on the left side.     Heart sounds: Normal heart  sounds, S1 normal and S2 normal. Heart sounds not distant. No midsystolic click and no opening snap. No murmur heard.   No friction rub.  Pulmonary:     Effort: Pulmonary effort is normal. No respiratory distress.     Breath sounds: Normal breath  sounds. No decreased breath sounds, wheezing or rales.  Chest:     Chest wall: No tenderness.  Abdominal:     General: There is no distension.     Palpations: Abdomen is soft.     Tenderness: There is no abdominal tenderness.  Musculoskeletal:     Cervical back: Normal range of motion.     Right lower leg: No edema.     Left lower leg: No edema.  Skin:    General: Skin is warm and dry.     Nails: There is no clubbing.  Neurological:     Mental Status: He is alert and oriented to person, place, and time.  Psychiatric:        Speech: Speech normal.        Behavior: Behavior normal.        Thought Content: Thought content normal.        Judgment: Judgment normal.    Wt Readings from Last 3 Encounters:  02/15/21 154 lb (69.9 kg)  12/05/20 154 lb (69.9 kg)  11/21/20 154 lb (69.9 kg)     ASSESSMENT & PLAN:   CAD involving the native coronary arteries with exertional dyspnea and chest tightness concerning for anginal equivalent: Currently without symptoms at rest.  Symptoms continue to be present with exertion and are concerning for anginal equivalent.  After discussion with patient, wife, and his primary cardiologist, we have agreed to defer direct transfer to the ED initially, with plans to have a stat high-sensitivity troponin, CBC, and BMP drawn in the medical mall.  If his high-sensitivity troponin is elevated he will be transported to the ED for further evaluation and trending of troponin and likely hospital admission for cardiac cath.  In this scenario, timing of cardiac cath would be to be determined based on cath schedule and timing of his last Eliquis dose (which was this morning).  If high-sensitivity troponin is negative, we will plan  to pursue outpatient cardiac cath with his primary cardiologist on 10/24, or sooner if indicated by symptoms.  Patient, wife, and primary cardiologist are in agreement with this plan.  Risks and benefits of cardiac catheterization have been discussed with the patient including risks of bleeding, bruising, infection, kidney damage, stroke, heart attack, and death. The patient understands these risks and is willing to proceed with the procedure. All questions have been answered and concerns listened to.   Cardiomyopathy: Currently of uncertain etiology.  Mildly reduced LV systolic function was initially noted on echo in 02/2019 and again in 03/2020.  Cannot exclude progression of underlying CAD as outlined above.  Plan for ischemic evaluation, with timing to be determined based on troponin level.  As blood pressure allows, recommend optimization and escalation of GDMT including resumption of beta-blocker with possible addition of ARNI and SGLT2i as able.  May need to defer MRA given underlying CKD stage III.  Depending findings on LHC, will need to repeat echo down the road.  Persistent A. fib: Currently in AV paced rhythm.  He is holding metoprolol with noted relative hypotension.  He remains on Tikosyn and has not missed any doses.  Given a CHA2DS2-VASc of at least (CHF, HTN, age x2, vascular disease), he remains on apixaban and denies missing any doses.  No symptoms concerning for falls or bleeding.  Check CBC.  Complete heart block: Status post pacemaker.  Device appears to be functioning normally.  Follow-up with EP as directed  HTN: With relative hypotension, he continues to hold amlodipine and metoprolol.  He is also holding Flomax.  Blood pressure is similar to office visit in 09/2020, though baseline BP does appear to be higher in the 254D to 826E systolic.  HLD: LDL 70 in 10/2020.  He remains on atorvastatin 40 mg daily.  CKD stage IIIb: Check BMP.  Disposition: F/u with Dr. Fletcher Anon or an APP post  cath, pending labs, and EP as directed.   Medication Adjustments/Labs and Tests Ordered: Current medicines are reviewed at length with the patient today.  Concerns regarding medicines are outlined above. Medication changes, Labs and Tests ordered today are summarized above and listed in the Patient Instructions accessible in Encounters.   Signed, Christell Faith, PA-C 02/15/2021 5:16 PM     [address] [place], [place] [address] [phone]

## 2021-02-15 NOTE — H&P (View-Only) (Signed)
Cardiology Office Note    Date:  02/15/2021   ID:  CALUB TARNOW, DOB 1932-07-02, MRN [id number]  PCP:  Jerrol Banana., MD  Cardiologist:  [name], MD  Electrophysiologist:  Virl Axe, MD   Chief Complaint: Exertional chest tightness, dyspnea, and generalized fatigue for 1 week  History of Present Illness:   Thomas [name] is a 87 y.o. 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 fib on apixaban, cardiomyopathy, TIA, CKD stage III with partial nephrectomy due to cancer, HTN, HLD, and GERD who presents for evaluation of a 1 week history of exertional chest tightness, dyspnea, and generalized fatigue.  Echo in 08/2017 showed a low normal LV systolic function with an EF of 50 to 55% with anterior septal wall motion consistent with paced rhythm.  Coronary CTA in 09/2017 showed a calcium score of 1366 with possible significant stenosis in the mid LAD and a 50% stenosis in the mid LCx.  The RCA could not be visualized due to pacemaker artifact.  LHC in 10/2017 showed borderline three-vessel CAD with 60% mid LAD stenosis, 50% proximal LCx stenosis, and 70% mid RCA stenosis.  The coronary arteries were noted to be moderately calcified overall.  LVEDP was 18 mmHg.  No revascularization was performed.  He has not required ischemic evaluation since.  Echo from 02/2019 showed a slight drop in his LV systolic function with an EF of 45 to 50% with global hypokinesis.  Most recent echo from 03/2020 showed a stable EF of 45 to 50%, global hypokinesis, mild LVH, grade 1 diastolic dysfunction, low normal RV systolic function with normal ventricular cavity size, normal PASP, mildly dilated right atrium, mild to moderate mitral valve regurgitation, mild aortic valve insufficiency, mild to moderate aortic valve sclerosis without evidence of stenosis, and an estimated right atrial pressure of 3 mmHg.  He comes in  accompanied by his wife today.  He notes over the past week he has been experiencing exertional chest tightness, dyspnea, and generalized fatigue/malaise with increased somnolence.  No syncope.  No frank angina/chest pain.  He does not believe these symptoms feel similar to his chest pain that he was experiencing in 2019, though does state they feel similar to what he was experiencing with rheumatic fever when he was 87 years old.  In the timeline of the above, over the past couple of days he has noted his blood pressure has been running in the low 100s when his baseline is typically in the 681L to 572I systolic.  He has not taken any antihypertensive medications over the past couple of days, though continues to note relative hypotension.  He has not missed any doses of Tikosyn or apixaban.  No falls, hematochezia, or melena.  No lower extremity swelling, abdominal distention, orthopnea, or early satiety.  At rest in the exam room, he is largely asymptomatic.  He did note exertional shortness of breath and fatigue with ambulation in to the office today, though this is unchanged from what he has been experiencing over the past week.  His weight is stable by our scale.   Labs independently reviewed: 11/2020 - BUN 29, serum creatinine 1.55, potassium 5.2, magnesium 2.3 10/2020 - TSH normal, TC 125, TG 69, HDL 41, LDL 70, albumin 3.8, AST/ALT normal, Hgb 13.6, PLT 229  Past Medical History:  Diagnosis Date   Aortic valve disorders    Arthritis    Atrial  flutter ([place]) 06/18/2016   "AF or AFl; not sure which" (06/23/2016)   Basal cell carcinoma    "face, nose left shoulder, left arm" (06/19/2016)   Basal cell carcinoma 09/13/2020   right temple   BBB (bundle branch block)    hx right   Chronic back pain    "neck, thoracic, lower back" (06/19/2016)   Complete heart block ([place]) 06/2016   Dyspnea    GERD (gastroesophageal reflux disease)    Gout    Heart block    "I've had type I, II Wenke before now"  (06/19/2016)   History of gout    History of hiatal hernia    "self dx'd" (06/19/2016)   Hyperlipidemia    Hypertension    Lyme disease    "dx'd by me 2003; cx's showed dx 08/2015"   Migraine    "3-4/year" (06/19/2016)   Presence of permanent cardiac pacemaker 06/19/2016   PVC's (premature ventricular contractions)    Renal cancer, left ([place]) 2006   S/P cryotherapy   Spinal stenosis    "cervical, 1 thoracic, lumbar" (06/19/2016)   Squamous carcinoma    "face, nose left shoulder, left arm" (06/19/2016)   Stroke ([place])    TIA (transient ischemic attack) 06/14/2016   "I'm not sure that's what it was" (06/25/2016)   Visit for monitoring Tikosyn therapy 09/09/2017    Past Surgical History:  Procedure Laterality Date   ANKLE FRACTURE SURGERY Right 1967   BACK SURGERY  05/07/2020   BASAL CELL CARCINOMA EXCISION     "face, nose left shoulder, left arm"   BIOPSY PROSTATE  2001 & 2003   CARDIAC CATHETERIZATION  1990's   CARDIOVERSION N/A 09/11/2017   Procedure: CARDIOVERSION;  Surgeon: Lelon Perla, MD;  Location: [facility];  Service: Cardiovascular;  Laterality: N/A;   FRACTURE SURGERY     HOLEP-LASER ENUCLEATION OF THE PROSTATE WITH MORCELLATION N/A 07/10/2020   Procedure: HOLEP-LASER ENUCLEATION OF THE PROSTATE WITH MORCELLATION;  Surgeon: Hollice Espy, MD;  Location: [facility];  Service: Urology;  Laterality: N/A;   INGUINAL HERNIA REPAIR Left 2012   INSERT / REPLACE / REMOVE PACEMAKER  06/19/2016   LAPAROSCOPIC ABLATION RENAL MASS     LEFT HEART CATH AND CORONARY ANGIOGRAPHY Left 10/23/2017   Procedure: LEFT HEART CATH AND CORONARY ANGIOGRAPHY;  Surgeon: Wellington Hampshire, MD;  Location: [facility];  Service: Cardiovascular;  Laterality: Left;   PACEMAKER IMPLANT N/A 06/19/2016   Procedure: Pacemaker Implant;  Surgeon: Deboraha Sprang, MD;  Location: [facility];  Service: Cardiovascular;  Laterality: N/A;   pacemasker     PROSTATE SURGERY     SQUAMOUS CELL  CARCINOMA EXCISION     "face, nose left shoulder, left arm"   TONSILLECTOMY AND ADENOIDECTOMY      Current Medications: Current Meds  Medication Sig   amLODipine (NORVASC) 5 MG tablet TAKE 1 TABLET BY MOUTH DAILY   apixaban (ELIQUIS) 5 MG TABS tablet Take 1 tablet (5 mg total) by mouth 2 (two) times daily.   apixaban (ELIQUIS) 5 MG TABS tablet Take 1 tablet (5 mg total) by mouth 2 (two) times daily.   atorvastatin (LIPITOR) 40 MG tablet Take 1 tablet (40 mg total) by mouth daily.   calcium carbonate (TUMS - DOSED IN MG ELEMENTAL CALCIUM) 500 MG chewable tablet Chew 1-2 tablets by mouth daily as needed for indigestion.   Cholecalciferol (VITAMIN D3) 5000 units CAPS Take 5,000 Units by mouth daily.   Coenzyme Q10 (COQ10)  100 MG CAPS Take 100 mg by mouth daily.   Cyanocobalamin (VITAMIN B-12 PO) Take 3,000 mcg by mouth daily with breakfast.   dofetilide (TIKOSYN) 125 MCG capsule Take 1 capsule (125 mcg total) by mouth 2 (two) times daily.   febuxostat (ULORIC) 40 MG tablet Take 1 tablet (40 mg total) by mouth daily.   gabapentin (NEURONTIN) 300 MG capsule Take 300 mg by mouth 2 (two) times daily.   Magnesium 500 MG TABS Take 500 mg by mouth at bedtime.   methocarbamol (ROBAXIN) 750 MG tablet Take 750 mg by mouth 2 times daily at 12 noon and 4 pm.   metoprolol succinate (TOPROL-XL) 50 MG 24 hr tablet TAKE 1 TABLET BY MOUTH DAILY WITH OR IMMEDIATELY FOLLOWING A MEAL   metroNIDAZOLE (FLAGYL) 500 MG tablet TAKE 1 TABLET BY MOUTH TWICE DAILY ON SUNDAY AND MONDAY (Patient taking differently: TAKE 1 TABLET BY MOUTH TWICE DAILY ON  EVERY OTHER SUNDAY AND MONDAY)   mirabegron ER (MYRBETRIQ) 25 MG TB24 tablet Take 1 tablet (25 mg total) by mouth daily.   Multiple Vitamins-Minerals (PRESERVISION/LUTEIN PO) Take 1 capsule by mouth in the morning.   NEEDLE, DISP, 18 G (BD DISP NEEDLES) 18G X 1-1/2" MISC 1 mg by Does not apply route every 14 (fourteen) days.   NEEDLE, DISP, 21 G (BD DISP NEEDLES) 21G X  1-1/2" MISC 1 mg by Does not apply route every 14 (fourteen) days.   oxyCODONE (OXY IR/ROXICODONE) 5 MG immediate release tablet Take 1 tablet (5 mg total) by mouth every 6 (six) hours as needed for moderate pain or breakthrough pain.   oxyCODONE-acetaminophen (PERCOCET) 7.5-325 MG tablet Take 1-2 tablets by mouth every 4 (four) hours as needed for severe pain.   polyethylene glycol (MIRALAX / GLYCOLAX) 17 g packet Take 17 g by mouth as needed.   propranolol (INDERAL) 40 MG tablet TAKE ONE (1) TABLET THREE (3) TIMES EACHDAY AS NEEDED   senna (SENOKOT) 8.6 MG tablet Take 1 tablet by mouth 2 (two) times daily.   Syringe, Disposable, (2-3CC SYRINGE) 3 ML MISC 1 mg by Does not apply route every 14 (fourteen) days.   tamsulosin (FLOMAX) 0.4 MG CAPS capsule Take 0.4 mg by mouth daily.   Testosterone 20.25 MG/ACT (1.62%) GEL APPLY 2 PUMPS DAILY.   testosterone cypionate (DEPOTESTOSTERONE CYPIONATE) 200 MG/ML injection Inject 1 mL (200 mg total) into the muscle every 28 (twenty-eight) days.   traMADol (ULTRAM) 50 MG tablet Take 50-100 mg by mouth 4 (four) times daily as needed.   Turmeric 500 MG CAPS Take 500 mg by mouth daily.    Allergies:   Other, Iodine, and Penicillins   Social History   Socioeconomic History   Marital status: Married    Spouse name: Not on file   Number of children: 3   Years of education: MD    Highest education level: Master's degree (e.g., MA, MS, MEng, MEd, MSW, MBA)  Occupational History   Occupation: Retired   Tobacco Use   Smoking status: Never   Smokeless tobacco: Never  Vaping Use   Vaping Use: Never used  Substance and Sexual Activity   Alcohol use: Yes    Alcohol/week: 1.0 standard drink    Types: 1 Glasses of wine per week   Drug use: No   Sexual activity: Yes  Other Topics Concern   Not on file  Social History Narrative   Independent at baseline. Lives with family   Drinks tea in the morning  Social Determinants of Health   Financial Resource  Strain: Not on file  Food Insecurity: Not on file  Transportation Needs: Not on file  Physical Activity: Not on file  Stress: Not on file  Social Connections: Not on file     Family History:  The patient's family history includes Aortic stenosis in his mother; Arthritis in his father; Heart attack in his brother; Stroke in his brother.  ROS:   Review of Systems  Constitutional:  Positive for malaise/fatigue. Negative for chills, diaphoresis, fever and weight loss.  HENT:  Negative for congestion.   Eyes:  Negative for discharge and redness.  Respiratory:  Positive for shortness of breath. Negative for cough, sputum production and wheezing.   Cardiovascular:  Negative for chest pain, palpitations, orthopnea, claudication, leg swelling and PND.       Chest tightness  Gastrointestinal:  Negative for blood in stool and melena.  Musculoskeletal:  Negative for falls and myalgias.  Skin:  Negative for rash.  Neurological:  Positive for weakness. Negative for dizziness, tingling, tremors, sensory change, speech change, focal weakness and loss of consciousness.  Endo/Heme/Allergies:  Does not bruise/bleed easily.  Psychiatric/Behavioral:  Negative for substance abuse. The patient is not nervous/anxious.   All other systems reviewed and are negative.   EKGs/Labs/Other Studies Reviewed:    Studies reviewed were summarized above. The additional studies were reviewed today:  2D echo 03/2020: 1. Left ventricular ejection fraction, by estimation, is 45 to 50%. The  left ventricle has mildly decreased function. The left ventricle  demonstrates global hypokinesis. There is mild left ventricular  hypertrophy. Left ventricular diastolic parameters  are consistent with Grade I diastolic dysfunction (impaired relaxation).  Elevated left atrial pressure. The average left ventricular global  longitudinal strain is -9.9 %. The global longitudinal strain is abnormal.   2. Right ventricular systolic  function is low normal. The right  ventricular size is normal. There is normal pulmonary artery systolic  pressure.   3. Right atrial size was mildly dilated.   4. The mitral valve is normal in structure. Mild to moderate mitral valve  regurgitation. No evidence of mitral stenosis.   5. The aortic valve is tricuspid. There is mild calcification of the  aortic valve. There is mild thickening of the aortic valve. Aortic valve  regurgitation is mild. Mild to moderate aortic valve  sclerosis/calcification is present, without any evidence  of aortic stenosis.   6. Pulmonic valve regurgitation not well assessed.   7. The inferior vena cava is normal in size with greater than 50%  respiratory variability, suggesting right atrial pressure of 3 mmHg. __________  2D echo 02/2019: 1. Left ventricular ejection fraction, by visual estimation, is 45 to  50%. The left ventricle has mildly decreased function. Mild global  hypokinesis. Normal left ventricular size. There is mildly increased left  ventricular hypertrophy.   2. Left ventricular diastolic Doppler parameters are consistent with  impaired relaxation pattern of LV diastolic filling.   3. Global right ventricle has normal systolic function.The right  ventricular size is normal. No increase in right ventricular wall  thickness.   4. Mildly elevated pulmonary artery systolic pressure.   5. The tricuspid regurgitant velocity is 2.70 m/s, and with an assumed  right atrial pressure of 10 mmHg, the estimated right ventricular systolic  pressure is mildly elevated at 39.2 mmHg.   6. Left atrial size was mildly dilated.   7. Moderate mitral valve regurgitation.   8. Tricuspid valve regurgitation is mild.  9. Aortic valve regurgitation is mild. Moderate aortic valve  sclerosis/calcification without any evidence of aortic stenosis.  10. A pacer wire is visualized.  __________  LHC 10/2017:  Prox LAD lesion is 30% stenosed. Prox LAD to Mid LAD  lesion is 60% stenosed. Prox Cx lesion is 50% stenosed. Ost 2nd Mrg to 2nd Mrg lesion is 40% stenosed. Dist RCA lesion is 40% stenosed with 40% stenosed side branch in Post Atrio. Prox RCA lesion is 30% stenosed. Mid RCA lesion is 70% stenosed.   1.  Borderline three-vessel coronary artery disease with 60% mid LAD stenosis, 50% proximal left circumflex stenosis and 70% mid RCA stenosis.  The coronary arteries are overall moderately to heavily calcified.  The mid LAD stenosis is just after the origin of a large diagonal branch. 2.  Left ventricular angiography was not performed due to mild chronic kidney disease. 3.  Mildly elevated left ventricular end-diastolic pressure at 18 to 20 mmHg.  Recommendations:   Recommendations: Recommend aggressive medical therapy.  The patient has minimal symptoms at the present time.  Revascularization of the RCA and possibly the LAD can be considered for significant anginal symptoms. __________  2D echo 08/2017: - Left ventricle: The cavity size was normal. Systolic function was    normal. The estimated ejection fraction was in the range of 50%    to 55%. Anteroseptal wall motion abnormality consistent with    paced rhythm. The study is not technically sufficient to allow    evaluation of LV diastolic function.  - Aortic valve: There was trivial regurgitation. Mean gradient (S):    5 mm Hg.  - Mitral valve: There was mild to moderate regurgitation.  - Left atrium: The atrium was moderately dilated.  - Right ventricle: The cavity size was mildly dilated. Wall    thickness was normal. Systolic function was mildly reduced.  - Pulmonary arteries: Systolic pressure was mildly elevated. PA    peak pressure: 41 mm Hg (S).  __________  Coronary CTA with FFR 09/2017: 1. Coronary artery calcium score 1366 Agatston units places the patient at the 74th percentile for age and gender. This suggests intermediate risk for future cardiac events.   2.  The RCA is  uninterpretable due to pacemaker artifact.   3.  Up to 50% stenosis in the mid LCx.   4.  I am concerned for severe stenosis in the mid LAD after D2.   I will send for FFR of the LAD, but study may be rejected because of the uninterpretable RCA.   FFR 0.72 mid LAD.   FFR 0.81 distal LCx.   IMPRESSION: 1.  Hemodynamically significant mid LAD stenosis. 2.  Probably not hemodynamically significant mid LCx stenosis. __________  Limited echo 06/2016: - Left ventricle: The cavity size was normal. Wall thickness was    normal. Systolic function was normal. The estimated ejection    fraction was in the range of 55% to 60%. Wall motion was normal;    there were no regional wall motion abnormalities. Doppler    parameters are consistent with abnormal left ventricular    relaxation (grade 1 diastolic dysfunction).  - Aortic valve: Sclerosis without stenosis.  - Aorta: Mildly dilated aortic root. Aortic root dimension: 39 mm    (ED).  - Right ventricle: The cavity size was normal. Pacer wire or    catheter noted in right ventricle. Systolic function was normal.  - Pulmonary arteries: No complete TR doppler jet so unable to    estimate  PA systolic pressure.  - Inferior vena cava: The vessel was normal in size. The    respirophasic diameter changes were in the normal range (>= 50%),    consistent with normal central venous pressure.   Impressions:   - Normal LV size and systolic function, EF 27-25%. Normal RV size    and systolic function. Aortic sclerosis without significant    stenosis.  __________  Echo with bubble study 06/2016: - Left ventricle: The cavity size was normal. Systolic function was    normal. The estimated ejection fraction was in the range of 55%    to 60%. Wall motion was normal; there were no regional wall    motion abnormalities. The study is not technically sufficient to    allow evaluation of LV diastolic function.  - Aortic valve: There was trivial  regurgitation.  - Mitral valve: There was mild regurgitation.  - Right ventricle: The cavity size was mildly dilated. Wall    thickness was normal. Systolic function was mildly reduced.  - Atrial septum: No defect or patent foramen ovale was identified.    Echo contrast study showed no right-to-left atrial level shunt,    at baseline or with provocation.  - Pulmonary arteries: Systolic pressure was mild to moderately    dilated. PA peak pressure: 46 mm Hg (S).   Impressions:   - No cardiac source of emboli was indentified. Rhythm is complete    heart block with RBBB.    EKG:  EKG is ordered today.  The EKG ordered today demonstrates AV paced rhythm, 64 bpm  Recent Labs: 11/01/2020: ALT 17; TSH 1.460 11/21/2020: Magnesium 2.3 02/15/2021: BUN 34; Creatinine, Ser 1.43; Hemoglobin 14.9; Platelets 166; Potassium 4.8; Sodium 134  Recent Lipid Panel    Component Value Date/Time   CHOL 125 11/01/2020 1054   CHOL 144 06/26/2012 1106   TRIG 69 11/01/2020 1054   TRIG 46 06/26/2012 1106   HDL 41 11/01/2020 1054   HDL 61 (H) 06/26/2012 1106   CHOLHDL 3.0 11/01/2020 1054   CHOLHDL 2.4 06/14/2016 1346   VLDL 8 06/14/2016 1346   VLDL 9 06/26/2012 1106   LDLCALC 70 11/01/2020 1054   LDLCALC 74 06/26/2012 1106    PHYSICAL EXAM:    VS:  BP 102/72 (BP Location: Left Arm, Patient Position: Sitting, Cuff Size: Normal)   Pulse 64   Ht 5\' 7"  (1.702 m)   Wt 154 lb (69.9 kg)   SpO2 95%   BMI 24.12 kg/m   BMI: Body mass index is 24.12 kg/m.  Physical Exam Vitals reviewed.  Constitutional:      Appearance: He is well-developed.  HENT:     Head: Normocephalic and atraumatic.  Eyes:     General:        Right eye: No discharge.        Left eye: No discharge.  Neck:     Vascular: No JVD.  Cardiovascular:     Rate and Rhythm: Normal rate and regular rhythm.     Pulses:          Posterior tibial pulses are 2+ on the right side and 2+ on the left side.     Heart sounds: Normal heart  sounds, S1 normal and S2 normal. Heart sounds not distant. No midsystolic click and no opening snap. No murmur heard.   No friction rub.  Pulmonary:     Effort: Pulmonary effort is normal. No respiratory distress.     Breath sounds: Normal breath  sounds. No decreased breath sounds, wheezing or rales.  Chest:     Chest wall: No tenderness.  Abdominal:     General: There is no distension.     Palpations: Abdomen is soft.     Tenderness: There is no abdominal tenderness.  Musculoskeletal:     Cervical back: Normal range of motion.     Right lower leg: No edema.     Left lower leg: No edema.  Skin:    General: Skin is warm and dry.     Nails: There is no clubbing.  Neurological:     Mental Status: He is alert and oriented to person, place, and time.  Psychiatric:        Speech: Speech normal.        Behavior: Behavior normal.        Thought Content: Thought content normal.        Judgment: Judgment normal.    Wt Readings from Last 3 Encounters:  02/15/21 154 lb (69.9 kg)  12/05/20 154 lb (69.9 kg)  11/21/20 154 lb (69.9 kg)     ASSESSMENT & PLAN:   CAD involving the native coronary arteries with exertional dyspnea and chest tightness concerning for anginal equivalent: Currently without symptoms at rest.  Symptoms continue to be present with exertion and are concerning for anginal equivalent.  After discussion with patient, wife, and his primary cardiologist, we have agreed to defer direct transfer to the ED initially, with plans to have a stat high-sensitivity troponin, CBC, and BMP drawn in the medical mall.  If his high-sensitivity troponin is elevated he will be transported to the ED for further evaluation and trending of troponin and likely hospital admission for cardiac cath.  In this scenario, timing of cardiac cath would be to be determined based on cath schedule and timing of his last Eliquis dose (which was this morning).  If high-sensitivity troponin is negative, we will plan  to pursue outpatient cardiac cath with his primary cardiologist on 10/24, or sooner if indicated by symptoms.  Patient, wife, and primary cardiologist are in agreement with this plan.  Risks and benefits of cardiac catheterization have been discussed with the patient including risks of bleeding, bruising, infection, kidney damage, stroke, heart attack, and death. The patient understands these risks and is willing to proceed with the procedure. All questions have been answered and concerns listened to.   Cardiomyopathy: Currently of uncertain etiology.  Mildly reduced LV systolic function was initially noted on echo in 02/2019 and again in 03/2020.  Cannot exclude progression of underlying CAD as outlined above.  Plan for ischemic evaluation, with timing to be determined based on troponin level.  As blood pressure allows, recommend optimization and escalation of GDMT including resumption of beta-blocker with possible addition of ARNI and SGLT2i as able.  May need to defer MRA given underlying CKD stage III.  Depending findings on LHC, will need to repeat echo down the road.  Persistent A. fib: Currently in AV paced rhythm.  He is holding metoprolol with noted relative hypotension.  He remains on Tikosyn and has not missed any doses.  Given a CHA2DS2-VASc of at least (CHF, HTN, age x2, vascular disease), he remains on apixaban and denies missing any doses.  No symptoms concerning for falls or bleeding.  Check CBC.  Complete heart block: Status post pacemaker.  Device appears to be functioning normally.  Follow-up with EP as directed  HTN: With relative hypotension, he continues to hold amlodipine and metoprolol.  He is also holding Flomax.  Blood pressure is similar to office visit in 09/2020, though baseline BP does appear to be higher in the 474Q to 595G systolic.  HLD: LDL 70 in 10/2020.  He remains on atorvastatin 40 mg daily.  CKD stage IIIb: Check BMP.  Disposition: F/u with Dr. Fletcher Anon or an APP post  cath, pending labs, and EP as directed.   Medication Adjustments/Labs and Tests Ordered: Current medicines are reviewed at length with the patient today.  Concerns regarding medicines are outlined above. Medication changes, Labs and Tests ordered today are summarized above and listed in the Patient Instructions accessible in Encounters.   Signed, Christell Faith, PA-C 02/15/2021 5:16 PM     [place] [address], [place] [id number] [phone]

## 2021-02-15 NOTE — Telephone Encounter (Signed)
Pt c/o of Chest Pain: STAT if CP now or developed within 24 hours  1. Are you having CP right now? Yes, last day or so - constant   2. Are you experiencing any other symptoms (ex. SOB, nausea, vomiting, sweating)? SOB - feels like he constantly needs to take a deep breath - BP is low ~100  3. How long have you been experiencing CP? Started within the last day  4. Is your CP continuous or coming and going? Continuous   5. Have you taken Nitroglycerin? no ?

## 2021-02-15 NOTE — Patient Instructions (Signed)
Medication Instructions:   Please continue your current medications at this time   *If you need a refill on your cardiac medications before your next appointment, please call your pharmacy*  Lab Work: STAT Troponin, CBC, BMET LABS WILL APPEAR ON MYCHART, ABNORMAL RESULTS WILL BE CALLED  Testing/Procedures: None  Follow-Up: At [facility], you and your health needs are our priority.  As part of our continuing mission to provide you with exceptional heart care, we have created designated Provider Care Teams.  These Care Teams include your primary Cardiologist (physician) and Advanced Practice Providers (APPs -  Physician Assistants and Nurse Practitioners) who all work together to provide you with the care you need, when you need it.  Your next appointment:   Pending lab work once they result

## 2021-02-16 ENCOUNTER — Telehealth: Payer: Self-pay

## 2021-02-16 ENCOUNTER — Telehealth: Payer: Self-pay | Admitting: Physician Assistant

## 2021-02-16 ENCOUNTER — Other Ambulatory Visit: Payer: Self-pay | Admitting: Physician Assistant

## 2021-02-16 MED ORDER — PREDNISONE 50 MG PO TABS: ORAL_TABLET | ORAL | 0 refills | Status: DC

## 2021-02-16 NOTE — Telephone Encounter (Signed)
Was able to reach back out to wife becky, advised on contrast allergy and need for prednisone piror to heart cath on 10/24. Per Christell Faith PA-C  Patient has contrast allergy noted.    Please send in Prednisone 50 mg to be taken 13 hours prior to cath, then another 50 mg tab 7 hours prior to cath, then the last 50 mg tab 1 hour prior to cath. Total number of prednisone tabs is 3, with no refills.    He will also need to take Benadryl 50 mg 1 hour prior to cath.   Becky verbalized understanding, prednisone called into [facility] and benadryl will be OTC. She is thankful for returning her call and will call back with any further concerns.   Prednisone 50 mg 10/23 at 8:30 pm 10/24 at 02:30 am 10/24 at 08:30 am (take with Benadryl 50 mg at 08:30 am)

## 2021-02-16 NOTE — Telephone Encounter (Signed)
Patient spouse calling back States they forgot to ask/discuss about his iodine allergy  Please call to discuss

## 2021-02-16 NOTE — Telephone Encounter (Signed)
Patient has contrast allergy noted.   Please send in Prednisone 50 mg to be taken 13 hours prior to cath, then another 50 mg tab 7 hours prior to cath, then the last 50 mg tab 1 hour prior to cath. Total number of prednisone tabs is 3, with no refills.   He will also need to take Benadryl 50 mg 1 hour prior to cath.

## 2021-02-16 NOTE — Telephone Encounter (Signed)
Attempted to reach pt via phone, could not make contact, reach out to wife Jacqlyn Larsen (DPR approved), spoke to wife regarding upcoming heart cath.  Labs at yesterday's [place] PA-C and Dr. Fletcher Anon discuss heart cath d/t symptoms (this was discuss at [facility] yesterday with wife and Mr. Robar)  Both are agreeable to proceed with heart cath.  Schedule 10/24 at 09:30 am, arrive at 08:30 am at [facility]  Hold Eliquis Sat, Sun, and Mon morning (weekend before schedule cath) Can take morning meds with SMALL sip if water Please expect a call from our [facility] to pre-register you Do not eat/drink anything after midnight Someone will need to drive you home It is recommended someone be with you for the first 24 hours after your procedure Wear clothes that are easy to get on/off and wear slip on shoes if possible  Becky verbalized understanding and very thankful for calling. Otherwise all questions or concerns were address and no additional concerns at this time. Agreeable to plan, will call back for anything further.

## 2021-02-16 NOTE — Addendum Note (Signed)
Addended by: Wynema Birch on: 02/16/2021 10:51 AM   Modules accepted: Orders

## 2021-02-19 ENCOUNTER — Other Ambulatory Visit: Payer: Self-pay

## 2021-02-19 ENCOUNTER — Ambulatory Visit: Payer: Medicare Other | Admitting: Physical Therapy

## 2021-02-19 VITALS — BP 115/70

## 2021-02-19 DIAGNOSIS — G8929 Other chronic pain: Secondary | ICD-10-CM | POA: Diagnosis not present

## 2021-02-19 DIAGNOSIS — M533 Sacrococcygeal disorders, not elsewhere classified: Secondary | ICD-10-CM | POA: Diagnosis not present

## 2021-02-19 DIAGNOSIS — M545 Low back pain, unspecified: Secondary | ICD-10-CM

## 2021-02-19 DIAGNOSIS — M4125 Other idiopathic scoliosis, thoracolumbar region: Secondary | ICD-10-CM | POA: Diagnosis not present

## 2021-02-19 DIAGNOSIS — N39498 Other specified urinary incontinence: Secondary | ICD-10-CM

## 2021-02-19 DIAGNOSIS — R293 Abnormal posture: Secondary | ICD-10-CM | POA: Diagnosis not present

## 2021-02-19 DIAGNOSIS — R2689 Other abnormalities of gait and mobility: Secondary | ICD-10-CM

## 2021-02-19 NOTE — Therapy (Signed)
[place] MAIN [facility] SERVICES [address] [place], [place], [address] Phone: [phone]   Fax:  [phone]  Physical Therapy Treatment  Patient Details  Name: Thomas [name] MRN: [id number] Date of Birth: 01-16-1933 Referring Provider (PT): [place] and Erlene Quan   Encounter Date: 02/19/2021   PT End of Session - 02/19/21 1516     Visit Number 12    Date for PT Re-Evaluation 02/27/21   09/18/20, PN 02/05/21   PT Start Time 1400    PT Stop Time 1505    PT Time Calculation (min) 65 min    Activity Tolerance Patient tolerated treatment well;No increased pain    Behavior During Therapy WFL for tasks assessed/performed             Past Medical History:  Diagnosis Date   Aortic valve disorders    Arthritis    Atrial flutter ([place]) 06/18/2016   "AF or AFl; not sure which" (06/23/2016)   Basal cell carcinoma    "face, nose left shoulder, left arm" (06/19/2016)   Basal cell carcinoma 09/13/2020   right temple   BBB (bundle branch block)    hx right   Chronic back pain    "neck, thoracic, lower back" (06/19/2016)   Complete heart block ([place]) 06/2016   Dyspnea    GERD (gastroesophageal reflux disease)    Gout    Heart block    "I've had type I, II Wenke before now" (06/19/2016)   History of gout    History of hiatal hernia    "self dx'd" (06/19/2016)   Hyperlipidemia    Hypertension    Lyme disease    "dx'd by me 2003; cx's showed dx 08/2015"   Migraine    "3-4/year" (06/19/2016)   Presence of permanent cardiac pacemaker 06/19/2016   PVC's (premature ventricular contractions)    Renal cancer, left ([place]) 2006   S/P cryotherapy   Spinal stenosis    "cervical, 1 thoracic, lumbar" (06/19/2016)   Squamous carcinoma    "face, nose left shoulder, left arm" (06/19/2016)   Stroke ([place])    TIA (transient ischemic attack) 06/14/2016   "I'm not sure that's what it was" (06/25/2016)   Visit for monitoring Tikosyn therapy 09/09/2017    Past  Surgical History:  Procedure Laterality Date   ANKLE FRACTURE SURGERY Right 1967   BACK SURGERY  05/07/2020   BASAL CELL CARCINOMA EXCISION     "face, nose left shoulder, left arm"   BIOPSY PROSTATE  2001 & 2003   CARDIAC CATHETERIZATION  1990's   CARDIOVERSION N/A 09/11/2017   Procedure: CARDIOVERSION;  Surgeon: Lelon Perla, MD;  Location: [facility];  Service: Cardiovascular;  Laterality: N/A;   FRACTURE SURGERY     HOLEP-LASER ENUCLEATION OF THE PROSTATE WITH MORCELLATION N/A 07/10/2020   Procedure: HOLEP-LASER ENUCLEATION OF THE PROSTATE WITH MORCELLATION;  Surgeon: Hollice Espy, MD;  Location: [facility];  Service: Urology;  Laterality: N/A;   INGUINAL HERNIA REPAIR Left 2012   INSERT / REPLACE / REMOVE PACEMAKER  06/19/2016   LAPAROSCOPIC ABLATION RENAL MASS     LEFT HEART CATH AND CORONARY ANGIOGRAPHY Left 10/23/2017   Procedure: LEFT HEART CATH AND CORONARY ANGIOGRAPHY;  Surgeon: Wellington Hampshire, MD;  Location: [facility];  Service: Cardiovascular;  Laterality: Left;   PACEMAKER IMPLANT N/A 06/19/2016   Procedure: Pacemaker Implant;  Surgeon: Deboraha Sprang, MD;  Location: [facility];  Service: Cardiovascular;  Laterality: N/A;   pacemasker  PROSTATE SURGERY     SQUAMOUS CELL CARCINOMA EXCISION     "face, nose left shoulder, left arm"   TONSILLECTOMY AND ADENOIDECTOMY      Vitals:   02/19/21 1420 02/19/21 1520  BP: 120/70 115/70     Subjective Assessment - 02/19/21 1416     Subjective Pt can feel a vague sensation to urinate. Pt worked on cutting trees with chainsaw and cut the tree into chunks and lifted a few of them. The back area did not bother him afterwards. Pt can tell he had poor back posture for 4 hours. Pt reported last week a few days after last sesion, he noticed low BP and SOB. Pt went to see his cardiologist. Pt will be undergoing a catherization next week and will not be able to make to next week's appt.    Pertinent History Lumbar  imagining 05/09/19   "L2 compression deformity is again identified. L1 and L3 pedicle  screws are seen with posterior fixation identified. The overall  appearance is stable from the intraoperative exam. Mild degenerative  anterolisthesis of L4 on L5 is seen. Intraoperative spot films of the lumbar spine demonstrate initially  2 posterior spinal needles marking the T12 and L3 spinous processes."   HOLEP 07/2020    Patient Stated Goals get his back straight and to learn how to deal chronic back ache in teh sacoiliac area and to regain control of his urinary leakage.                [facility] PT Assessment - 02/19/21 1520       Observation/Other Assessments   Observations more upright posture, no cues required for forward head, excessive cues for crossed ankles                        Pelvic Floor Special Questions - 02/19/21 1445     External Perineal Exam semi reclined position due to cuirrent cardiac issues ( reoprt of low BP and pending catherization appt next session)    External Palpation 1 sec, 5 reps seated,  endurance contraction:  3 sec, 3 reps               [facility] Adult PT Treatment/Exercise - 02/19/21 1455       Therapeutic Activites    Other Therapeutic Activities monitored BP, educated pt to perform HEP in semi reclined position and not in hooklying due to his current cardiac issues. Advised pt to discontinue use of band HEP after his catherization procedure until clearance from cardiologist.      Neuro Re-ed    Neuro Re-ed Details  cued for proper deep core level 2, seated and semi reclined pelvic floor quick and endurance contractions with less posterior co-activation                          PT Long Term Goals - 02/05/21 1636       PT LONG TERM GOAL #1   Title Pt will complete a chart detailing the method he uses for continence in relationship with activities across each day of the week in order to gather data for baseline    Time 2     Period Weeks    Status Achieved      PT LONG TERM GOAL #2   Title Pt will improve gait mechanics and posture  to increase walking endurance from 20 min to > 30 min in order to  walk with less pain    Time 6    Period Weeks    Status Achieved      PT LONG TERM GOAL #3   Title Pt will demo less forard head posture from 30 cm from earlobe to wall to < 25 cm in order to achieve more upright posture to optimize IAP system for postural stability ( less LBP) and urinary continence 12/19/20: 26 cm)    Time 8    Period Weeks    Status Partially Met      PT LONG TERM GOAL #4   Title Pt will demo IND with deep core coordination and pelvic floor coordination without compensatory patterns to help with ADLs    Time 4    Period Weeks    Status Achieved      PT LONG TERM GOAL #5   Title Pt will demo less L thoracic shift, less convex curve at T/L junction and more reciprocal gait pattern in order to regain structural midline and to progress to pelvic floor contractions with better outcomes    Time 10    Period Weeks    Status Achieved      PT LONG TERM GOAL #6   Title Pt will report decreased pain by 50% with Bending over, getting out of bed, reaching up overhead activities in order to perform his hobbies and household chores.    Time 8    Period Weeks    Status On-going      PT LONG TERM GOAL #7   Title Pt will demo proper deep core coordination, pelvic floor contractions 3 sec, 3 reps in order to to minimize leakage while gardening    Baseline chest breathing ( limited anterior diaphragmatic breathing)    Time 8    Period Weeks    Status On-going                   Plan - 02/19/21 1517     Clinical Impression Statement Pt progressed to seated quick contractions with cues for less posterior activation. Pt also progressed to long endurance holds in semi recline position.  Pt required cues for deep core coordination.  Advised pt to discontinue use of band HEP after his  catherization procedure until clearance from cardiologist. Advised pt to discontinue use of band HEP after his catherization procedure until clearance from cardiologist.   Also advised pt to be cautious with chainsaw activities and lifting logs and provided info to contact arborist as pt reported he performing these tasks this past week. Pt voiced understanding.   Pt continues to benefit from skilled PT    Personal Factors and Comorbidities Comorbidity 3+    Comorbidities Lumbar fusion L1-3    Examination-Activity Limitations Toileting;Lift;Bed Mobility;Locomotion Level;Bend;Sit;Sleep;Continence;Squat;Stand;Transfers;Reach Overhead    Stability/Clinical Decision Making Evolving/Moderate complexity    Rehab Potential Good    PT Frequency 1x / week    PT Duration Other (comment)   10   PT Treatment/Interventions Moist Heat;Therapeutic activities;Therapeutic exercise;Patient/family education;Neuromuscular re-education;Stair training;Gait training;Manual techniques;Taping;Splinting;Dry needling;Balance training;ADLs/Self Care Home Management;Cryotherapy;Traction;Functional mobility training;Energy conservation;Joint Manipulations;Passive range of motion;Scar mobilization    Consulted and Agree with Plan of Care Patient;Family member/caregiver    Family Member Consulted wife             Patient will benefit from skilled therapeutic intervention in order to improve the following deficits and impairments:  Decreased activity tolerance, Decreased endurance, Decreased range of motion, Decreased strength, Decreased coordination, Decreased mobility, Decreased scar mobility, Abnormal  gait, Increased fascial restricitons, Impaired sensation, Improper body mechanics, Pain, Hypermobility, Increased muscle spasms, Hypomobility, Difficulty walking, Decreased knowledge of precautions, Decreased balance, Postural dysfunction, Cardiopulmonary status limiting activity, Decreased safety awareness  Visit  Diagnosis: Abnormal posture  Other idiopathic scoliosis, thoracolumbar region  Other abnormalities of gait and mobility  Other urinary incontinence  Chronic low back pain without sciatica, unspecified back pain laterality  Sacrococcygeal disorders, not elsewhere classified     Problem List Patient Active Problem List   Diagnosis Date Noted   Lumbar burst fracture ([place]) 05/04/2020   Hypogonadism male 10/10/2019   Persistent atrial fibrillation ([place]) 01/12/2019   Acute low back pain 11/04/2017   Abnormal screening cardiac CT    Visit for monitoring Tikosyn therapy 09/09/2017   History of stroke 08/29/2016   Paroxysmal atrial fibrillation ([place]) 07/23/2016   Coronary artery disease 07/23/2016   Snores 06/27/2016   Cardiac pacemaker in situ 06/25/2016   Hemiparesis ([place]) 06/25/2016   Acute ischemic stroke ([place])    Acute left hemiparesis ([place])    Hemisensory loss    Dysarthria    Stroke-like symptoms    Complete heart block ([facility])    Pain in thoracic spine    Orthostasis    Lethargy    Occlusion of vertebral artery    TIA (transient ischemic attack) 06/14/2016   Arthritis 02/06/2015   Esophageal reflux 02/06/2015   Arthritis urica 02/06/2015   Cannot sleep 02/06/2015   Arthritis, degenerative 02/06/2015   Adenocarcinoma, renal cell ([place]) 02/06/2015   Benign prostatic hyperplasia with lower urinary tract symptoms 11/21/2014   Personal history of other malignant neoplasm of kidney 11/21/2014   Palpitations 12/31/2013   Hyperlipidemia 11/02/2010   HYPERTENSION, BENIGN 04/16/2010    Jerl Mina, PT 02/19/2021, 4:00 PM  [place] MAIN [facility] SERVICES [address] [place], [place], [address] Phone: [phone]   Fax:  [phone]  Name: LAMARIUS DIRR MRN: [id number] Date of Birth: Jul 10, 1932

## 2021-02-19 NOTE — Patient Instructions (Signed)
PELVIC FLOOR / KEGEL EXERCISES    Pelvic floor/ Kegel exercises are used to strengthen the muscles in the base of your pelvis that are responsible for supporting your pelvic organs and preventing urine/feces leakage. Based on your therapist's recommendations, they can be performed while standing, sitting, or lying down.  Make yourself aware of this muscle group spanning in a diamond shaped area. Top of the diamond is the pubic bone, bottom of the diamond is the tailbone, and edges of the diamond is where the sitting bones are located. Use these cues to notice your ability to lengthen and lower them towards feet with inhalation (pelvic floor lowers like a hammock)  and to lift them upward with exhalation (pelvic floor domes up like an umbrella) :   Inhale, do nothing but feel the belly expand and pelvic floor lowering/expanding like a hammock. Exhale, feel pelvic floor lifting upwards with the scrotum as if you are walking into a cold lake.   Inhale, do nothing, relax belly and pelvic floor. Then exhale, pull up the center of the [place] shaped area of your pelvic floor muscles inside your body while your belly/back muscles wrap tighter or "hug" around you.  Another way to practice is by place your hand on top of your pubic bone. Tighten and draw in the muscles around the anal and uretha openings. You will feel the muscles lift towards your pubic bone and squeeze the openings shut. If you are squeezing your buttock muscles too, you will need to decrease your amount of effort by 50% to not involve the buttocks.   Common Errors: Breath holding: If you are holding your breath, you may be bearing down against your bladder instead of pulling it up. If you belly bulges up while you are squeezing, you are holding your breath. Be sure to breathe gently in and out while exercising. Counting out loud may help you avoid holding your breath. Accessory muscle use: You should not see or feel other muscle movement when  performing pelvic floor exercises. When done properly, no one can tell that you are performing the exercises. Keep the buttocks, belly and inner thighs relaxed. Overdoing it: Your muscles can fatigue and stop working for you if you over-exercise. You may actually leak more or feel soreness at the lower abdomen or rectum.  YOUR HOME EXERCISE PROGRAM  QUICK FLICKS:  Position: seated  inhale, exhale. squeeze the muscle quick and hold  for one second, then let go for one second.  Perform 5 repetitions, across 5 different times of day  breakfast, lunch, dinner,  midmorning and mid afternoon  times/day  LONG HOLDS: Position: semi reclined ( pillows)  inhale, exhale then squeeze the muscle and then count aloud for 3 seconds. Rest (3 long breaths)  Perform 3 repetitions, 3 times/day

## 2021-02-26 ENCOUNTER — Encounter: Payer: Self-pay | Admitting: Cardiovascular Disease

## 2021-02-26 ENCOUNTER — Encounter
Admission: RE | Disposition: A | Discharge status: Home / Self Care | Payer: Self-pay | Source: Home / Self Care | Attending: Cardiovascular Disease

## 2021-02-26 ENCOUNTER — Other Ambulatory Visit: Payer: Self-pay

## 2021-02-26 ENCOUNTER — Ambulatory Visit
Admission: RE | Admit: 2021-02-26 | Discharge: 2021-02-26 | Disposition: A | Discharge status: Home / Self Care | Payer: Medicare Other | Attending: Cardiovascular Disease | Admitting: Cardiovascular Disease

## 2021-02-26 ENCOUNTER — Encounter: Payer: Medicare Other | Admitting: Physical Therapy

## 2021-02-26 DIAGNOSIS — E785 Hyperlipidemia, unspecified: Secondary | ICD-10-CM | POA: Insufficient documentation

## 2021-02-26 DIAGNOSIS — Z880 Allergy status to penicillin: Secondary | ICD-10-CM | POA: Diagnosis not present

## 2021-02-26 DIAGNOSIS — Z7901 Long term (current) use of anticoagulants: Secondary | ICD-10-CM | POA: Insufficient documentation

## 2021-02-26 DIAGNOSIS — I428 Other cardiomyopathies: Secondary | ICD-10-CM | POA: Diagnosis not present

## 2021-02-26 DIAGNOSIS — Z79899 Other long term (current) drug therapy: Secondary | ICD-10-CM | POA: Insufficient documentation

## 2021-02-26 DIAGNOSIS — K219 Gastro-esophageal reflux disease without esophagitis: Secondary | ICD-10-CM | POA: Diagnosis not present

## 2021-02-26 DIAGNOSIS — I25110 Atherosclerotic heart disease of native coronary artery with unstable angina pectoris: Secondary | ICD-10-CM | POA: Diagnosis not present

## 2021-02-26 DIAGNOSIS — I442 Atrioventricular block, complete: Secondary | ICD-10-CM | POA: Insufficient documentation

## 2021-02-26 DIAGNOSIS — R0609 Other forms of dyspnea: Secondary | ICD-10-CM | POA: Diagnosis not present

## 2021-02-26 DIAGNOSIS — I200 Unstable angina: Secondary | ICD-10-CM

## 2021-02-26 DIAGNOSIS — Z888 Allergy status to other drugs, medicaments and biological substances status: Secondary | ICD-10-CM | POA: Diagnosis not present

## 2021-02-26 DIAGNOSIS — I4819 Other persistent atrial fibrillation: Secondary | ICD-10-CM | POA: Insufficient documentation

## 2021-02-26 DIAGNOSIS — Z950 Presence of cardiac pacemaker: Secondary | ICD-10-CM | POA: Diagnosis not present

## 2021-02-26 DIAGNOSIS — I129 Hypertensive chronic kidney disease with stage 1 through stage 4 chronic kidney disease, or unspecified chronic kidney disease: Secondary | ICD-10-CM | POA: Diagnosis not present

## 2021-02-26 DIAGNOSIS — N1832 Chronic kidney disease, stage 3b: Secondary | ICD-10-CM | POA: Insufficient documentation

## 2021-02-26 HISTORY — PX: LEFT HEART CATH AND CORONARY ANGIOGRAPHY: CATH118249

## 2021-02-26 SURGERY — LEFT HEART CATH AND CORONARY ANGIOGRAPHY
Anesthesia: Moderate Sedation | Laterality: Left

## 2021-02-26 MED ORDER — HEPARIN SODIUM (PORCINE) 1000 UNIT/ML IJ SOLN
INTRAMUSCULAR | Status: AC
  Filled 2021-02-26: qty 1

## 2021-02-26 MED ORDER — IOHEXOL 350 MG/ML SOLN
INTRAVENOUS | Status: DC | PRN
  Administered 2021-02-26: 69 mL

## 2021-02-26 MED ORDER — HEPARIN SODIUM (PORCINE) 1000 UNIT/ML IJ SOLN
INTRAMUSCULAR | Status: DC | PRN
  Administered 2021-02-26: 2000 [IU] via INTRAVENOUS

## 2021-02-26 MED ORDER — ONDANSETRON HCL 4 MG/2ML IJ SOLN: 4.0000 mg | Freq: Four times a day (QID) | INTRAMUSCULAR | Status: DC | PRN

## 2021-02-26 MED ORDER — FENTANYL CITRATE (PF) 100 MCG/2ML IJ SOLN
INTRAMUSCULAR | Status: DC | PRN
  Administered 2021-02-26: 50 ug via INTRAVENOUS

## 2021-02-26 MED ORDER — ACETAMINOPHEN 325 MG PO TABS: 650.0000 mg | ORAL_TABLET | ORAL | Status: DC | PRN

## 2021-02-26 MED ORDER — SODIUM CHLORIDE 0.9 % IV SOLN: INTRAVENOUS | Status: DC

## 2021-02-26 MED ORDER — FENTANYL CITRATE (PF) 100 MCG/2ML IJ SOLN
INTRAMUSCULAR | Status: AC
  Filled 2021-02-26: qty 2

## 2021-02-26 MED ORDER — SODIUM CHLORIDE 0.9 % WEIGHT BASED INFUSION: 1.0000 mL/kg/h | INTRAVENOUS | Status: DC

## 2021-02-26 MED ORDER — VERAPAMIL HCL 2.5 MG/ML IV SOLN
INTRAVENOUS | Status: DC | PRN
  Administered 2021-02-26: 2.5 mg via INTRA_ARTERIAL

## 2021-02-26 MED ORDER — MIDAZOLAM HCL 2 MG/2ML IJ SOLN
INTRAMUSCULAR | Status: DC | PRN
  Administered 2021-02-26: 1 mg via INTRAVENOUS

## 2021-02-26 MED ORDER — MIDAZOLAM HCL 2 MG/2ML IJ SOLN
INTRAMUSCULAR | Status: AC
  Filled 2021-02-26: qty 2

## 2021-02-26 MED ORDER — ASPIRIN 81 MG PO CHEW
CHEWABLE_TABLET | ORAL | Status: AC
  Filled 2021-02-26: qty 1

## 2021-02-26 MED ORDER — LIDOCAINE HCL (PF) 1 % IJ SOLN
INTRAMUSCULAR | Status: DC | PRN
  Administered 2021-02-26: 2 mL

## 2021-02-26 MED ORDER — SODIUM CHLORIDE 0.9 % IV SOLN: 250.0000 mL | INTRAVENOUS | Status: DC | PRN

## 2021-02-26 MED ORDER — LIDOCAINE HCL 1 % IJ SOLN
INTRAMUSCULAR | Status: AC
  Filled 2021-02-26: qty 20

## 2021-02-26 MED ORDER — HEPARIN (PORCINE) IN NACL 1000-0.9 UT/500ML-% IV SOLN
INTRAVENOUS | Status: DC | PRN
  Administered 2021-02-26 (×2): 500 mL

## 2021-02-26 MED ORDER — SODIUM CHLORIDE 0.9% FLUSH: 3.0000 mL | Freq: Two times a day (BID) | INTRAVENOUS | Status: DC

## 2021-02-26 MED ORDER — SODIUM CHLORIDE 0.9 % WEIGHT BASED INFUSION
3.0000 mL/kg/h | INTRAVENOUS | Status: AC
  Administered 2021-02-26: 3 mL/kg/h via INTRAVENOUS

## 2021-02-26 MED ORDER — HEPARIN (PORCINE) IN NACL 1000-0.9 UT/500ML-% IV SOLN
INTRAVENOUS | Status: AC
  Filled 2021-02-26: qty 1000

## 2021-02-26 MED ORDER — SODIUM CHLORIDE 0.9% FLUSH: 3.0000 mL | INTRAVENOUS | Status: DC | PRN

## 2021-02-26 MED ORDER — ASPIRIN 81 MG PO CHEW
81.0000 mg | CHEWABLE_TABLET | ORAL | Status: AC
  Administered 2021-02-26: 81 mg via ORAL

## 2021-02-26 MED ORDER — VERAPAMIL HCL 2.5 MG/ML IV SOLN
INTRAVENOUS | Status: AC
  Filled 2021-02-26: qty 2

## 2021-02-26 SURGICAL SUPPLY — 11 items
CATH 5F 110X4 TIG (CATHETERS) ×2 IMPLANT
CATH INFINITI JR4 5F (CATHETERS) ×2 IMPLANT
DEVICE RAD TR BAND REGULAR (VASCULAR PRODUCTS) ×2 IMPLANT
DRAPE BRACHIAL (DRAPES) ×2 IMPLANT
GLIDESHEATH SLEND SS 6F .021 (SHEATH) ×2 IMPLANT
GUIDEWIRE INQWIRE 1.5J.035X260 (WIRE) ×1 IMPLANT
INQWIRE 1.5J .035X260CM (WIRE) ×2
PACK CARDIAC CATH (CUSTOM PROCEDURE TRAY) ×2 IMPLANT
PROTECTION STATION PRESSURIZED (MISCELLANEOUS) ×2
SET ATX SIMPLICITY (MISCELLANEOUS) ×2 IMPLANT
STATION PROTECTION PRESSURIZED (MISCELLANEOUS) ×1 IMPLANT

## 2021-02-26 NOTE — Interval H&P Note (Signed)
Cath Lab Visit (complete for each Cath Lab visit)  Clinical Evaluation Leading to the Procedure:   ACS: No.  Non-ACS:    Anginal Classification: CCS III  Anti-ischemic medical therapy: Maximal Therapy (2 or more classes of medications)  Non-Invasive Test Results: No non-invasive testing performed  Prior CABG: No previous CABG      History and Physical Interval Note:  02/26/2021 10:49 AM  Thomas [name]  has presented today for surgery, with the diagnosis of LT Cath    Unstable angina.  The various methods of treatment have been discussed with the patient and family. After consideration of risks, benefits and other options for treatment, the patient has consented to  Procedure(s): LEFT HEART CATH AND CORONARY ANGIOGRAPHY (Left) as a surgical intervention.  The patient's history has been reviewed, patient examined, no change in status, stable for surgery.  I have reviewed the patient's chart and labs.  Questions were answered to the patient's satisfaction.     [name]

## 2021-02-27 NOTE — Progress Notes (Signed)
02/28/21 1:49 PM   [name] 11/06/1932 [id number]  Referring provider:  Jerrol Banana., MD [address] [place],  [place] [address] Chief Complaint  Patient presents with   Follow-up     HPI: Thomas [name] is a 87 y.o.male with a personal history of BPH with outlet obstruction, bladder diverticulum, weak stream, and history for cystitis, who presents today for 6 month follow-up with IPSS and PVR.   He is s/p HoLEP 07/2020.   He has been working with physical therapy for his stress urinary incontinence.  He just only started doing pelvic floor exercises and was working on his posture back and leg issues up until this point.  He had a cardiac catheter placed and saw cardiology this morning.   He reports that he has not experienced much leakage during the nighttime. He has seen improvement with physical therapy. He reports his bladder hold 600-700 cc. He reports that his stream is not very heavy. He most bothersome symptom is that he cannot feel his sphincter.   He is not currently using nitroglycerin. He has tried a Designer, television/film set clamp before.   He is also been struggling with erectile dysfunction.  He is now gaining the physical stamina to start thinking about sexual activity again.  He has had a few early morning erections.   IPSS     Row Name 02/28/21 1300         International Prostate Symptom Score   How often have you had the sensation of not emptying your bladder? About half the time     How often have you had to urinate less than every two hours? About half the time     How often have you found you stopped and started again several times when you urinated? About half the time     How often have you found it difficult to postpone urination? More than half the time     How often have you had a weak urinary stream? About half the time     How often have you had to strain to start urination? More than half the time     How many times did you typically  get up at night to urinate? 2 Times     Total IPSS Score 22       Quality of Life due to urinary symptoms   If you were to spend the rest of your life with your urinary condition just the way it is now how would you feel about that? Unhappy              Score:  1-7 Mild 8-19 Moderate 20-35 Severe    PMH: Past Medical History:  Diagnosis Date   Aortic valve disorders    Arthritis    Atrial flutter ([place]) 06/18/2016   "AF or AFl; not sure which" (06/23/2016)   Basal cell carcinoma    "face, nose left shoulder, left arm" (06/19/2016)   Basal cell carcinoma 09/13/2020   right temple   BBB (bundle branch block)    hx right   Chronic back pain    "neck, thoracic, lower back" (06/19/2016)   Complete heart block ([place]) 06/2016   Dyspnea    GERD (gastroesophageal reflux disease)    Gout    Heart block    "I've had type I, II Wenke before now" (06/19/2016)   History of gout    History of hiatal hernia    "self dx'd" (06/19/2016)  Hyperlipidemia    Hypertension    Lyme disease    "dx'd by me 2003; cx's showed dx 08/2015"   Migraine    "3-4/year" (06/19/2016)   Presence of permanent cardiac pacemaker 06/19/2016   PVC's (premature ventricular contractions)    Renal cancer, left ([place]) 2006   S/P cryotherapy   Spinal stenosis    "cervical, 1 thoracic, lumbar" (06/19/2016)   Squamous carcinoma    "face, nose left shoulder, left arm" (06/19/2016)   Stroke ([place])    TIA (transient ischemic attack) 06/14/2016   "I'm not sure that's what it was" (06/25/2016)   Visit for monitoring Tikosyn therapy 09/09/2017    Surgical History: Past Surgical History:  Procedure Laterality Date   ANKLE FRACTURE SURGERY Right 1967   BACK SURGERY  05/07/2020   BASAL CELL CARCINOMA EXCISION     "face, nose left shoulder, left arm"   BIOPSY PROSTATE  2001 & 2003   CARDIAC CATHETERIZATION  1990's   CARDIOVERSION N/A 09/11/2017   Procedure: CARDIOVERSION;  Surgeon: Lelon Perla, MD;  Location: [facility];  Service: Cardiovascular;  Laterality: N/A;   [place] WITH MORCELLATION N/A 07/10/2020   Procedure: HOLEP-LASER ENUCLEATION OF THE PROSTATE WITH MORCELLATION;  Surgeon: Hollice [place], MD;  Location: [facility];  Service: Urology;  Laterality: N/A;   INGUINAL HERNIA REPAIR Left 2012   INSERT / REPLACE / REMOVE PACEMAKER  06/19/2016   LAPAROSCOPIC ABLATION RENAL MASS     LEFT HEART CATH AND CORONARY ANGIOGRAPHY Left 10/23/2017   Procedure: LEFT HEART CATH AND CORONARY ANGIOGRAPHY;  Surgeon: [place], MD;  Location: [facility];  Service: Cardiovascular;  Laterality: Left;   LEFT HEART CATH AND CORONARY ANGIOGRAPHY Left 02/26/2021   Procedure: LEFT HEART CATH AND CORONARY ANGIOGRAPHY;  Surgeon: [place], MD;  Location: [facility];  Service: Cardiovascular;  Laterality: Left;   PACEMAKER IMPLANT N/A 06/19/2016   Procedure: Pacemaker Implant;  Surgeon: Deboraha Sprang, MD;  Location: [facility];  Service: Cardiovascular;  Laterality: N/A;   pacemasker     PROSTATE SURGERY     SQUAMOUS CELL CARCINOMA EXCISION     "face, nose left shoulder, left arm"   TONSILLECTOMY AND ADENOIDECTOMY      Home Medications:  Allergies as of 02/28/2021       Reactions   Other    Vicryl sutures - patient states that site becomes "soupy"    Iodine Hives, Other (See Comments)   IVP contrast   Penicillins Itching, Rash, Other (See Comments)   ITCHY FEELING IN FINGERS Has patient had a PCN reaction causing immediate rash, facial/tongue/throat swelling, SOB or lightheadedness with hypotension: No Has patient had a PCN reaction causing severe rash involving mucus membranes or skin necrosis: No Has patient had a PCN reaction that required hospitalization No Has patient had a PCN reaction occurring within the last 10 years: No If all of the above answers are "NO", then may proceed with Cephalosporin use.         Medication List        Accurate as of February 28, 2021  1:49 PM. If you have any questions, ask your nurse or doctor.          2-3CC SYRINGE 3 ML Misc 1 mg by Does not apply route every 14 (fourteen) days.   amLODipine 5 MG tablet Commonly known as: NORVASC TAKE 1 TABLET BY MOUTH  DAILY   apixaban 2.5 MG Tabs tablet Commonly known as: ELIQUIS Take 2.5 mg by mouth 2 (two) times daily.   atorvastatin 40 MG tablet Commonly known as: LIPITOR Take 1 tablet (40 mg total) by mouth daily.   BD Disp Needles 18G X 1-1/2" Misc Generic drug: NEEDLE (DISP) 18 G 1 mg by Does not apply route every 14 (fourteen) days.   BD Disp Needles 21G X 1-1/2" Misc Generic drug: NEEDLE (DISP) 21 G 1 mg by Does not apply route every 14 (fourteen) days.   calcium carbonate 500 MG chewable tablet Commonly known as: TUMS - dosed in mg elemental calcium Chew 1-2 tablets by mouth daily as needed for indigestion.   CoQ10 100 MG Caps Take 100 mg by mouth daily.   dofetilide 125 MCG capsule Commonly known as: TIKOSYN Take 1 capsule (125 mcg total) by mouth 2 (two) times daily.   febuxostat 40 MG tablet Commonly known as: ULORIC Take 1 tablet (40 mg total) by mouth daily.   gabapentin 300 MG capsule Commonly known as: NEURONTIN Take 300 mg by mouth 2 (two) times daily.   Magnesium 500 MG Tabs Take 500 mg by mouth at bedtime.   methocarbamol 750 MG tablet Commonly known as: ROBAXIN Take 750 mg by mouth 2 times daily at 12 noon and 4 pm.   metoprolol succinate 50 MG 24 hr tablet Commonly known as: TOPROL-XL TAKE 1 TABLET BY MOUTH DAILY WITH OR IMMEDIATELY FOLLOWING A MEAL   metroNIDAZOLE 500 MG tablet Commonly known as: FLAGYL TAKE 1 TABLET BY MOUTH TWICE DAILY ON SUNDAY AND MONDAY   mirabegron ER 25 MG Tb24 tablet Commonly known as: MYRBETRIQ Take 1 tablet (25 mg total) by mouth daily.   oxyCODONE 5 MG immediate release tablet Commonly known as: Oxy IR/ROXICODONE Take 1 tablet (5  mg total) by mouth every 6 (six) hours as needed for moderate pain or breakthrough pain.   oxyCODONE-acetaminophen 7.5-325 MG tablet Commonly known as: PERCOCET Take 1-2 tablets by mouth every 4 (four) hours as needed for severe pain.   polyethylene glycol 17 g packet Commonly known as: MIRALAX / GLYCOLAX Take 17 g by mouth daily as needed for moderate constipation.   predniSONE 50 MG tablet Commonly known as: DELTASONE Take 10/23 at 8:30 pm, 10/24 at 02:30 am, and 10/24 at 08:30 am (take with Benadryl 50 mg at 08:30 am)   PRESERVISION/LUTEIN PO Take 1 capsule by mouth in the morning.   propranolol 40 MG tablet Commonly known as: INDERAL TAKE ONE (1) TABLET THREE (3) TIMES EACHDAY AS NEEDED   senna 8.6 MG tablet Commonly known as: SENOKOT Take 1 tablet by mouth 2 (two) times daily as needed for constipation.   tamsulosin 0.4 MG Caps capsule Commonly known as: FLOMAX Take 0.4 mg by mouth at bedtime.   terazosin 1 MG capsule Commonly known as: HYTRIN Take 1 mg by mouth at bedtime.   Testosterone 20.25 MG/ACT (1.62%) Gel APPLY 2 PUMPS DAILY.   testosterone cypionate 200 MG/ML injection Commonly known as: DEPOTESTOSTERONE CYPIONATE Inject 1 mL (200 mg total) into the muscle every 28 (twenty-eight) days.   traMADol 50 MG tablet Commonly known as: ULTRAM Take 50-100 mg by mouth 4 (four) times daily as needed for severe pain.   Turmeric 500 MG Caps Take 500 mg by mouth daily.   VITAMIN B-12 PO Take 3,000 mcg by mouth daily with breakfast.   Vitamin D3 125 MCG (5000 UT) Caps Take 5,000 Units by mouth daily.  Allergies:  Allergies  Allergen Reactions   Other     Vicryl sutures - patient states that site becomes "soupy"    Iodine Hives and Other (See Comments)    IVP contrast   Penicillins Itching, Rash and Other (See Comments)    ITCHY FEELING IN FINGERS Has patient had a PCN reaction causing immediate rash, facial/tongue/throat swelling, SOB or  lightheadedness with hypotension: No Has patient had a PCN reaction causing severe rash involving mucus membranes or skin necrosis: No Has patient had a PCN reaction that required hospitalization No Has patient had a PCN reaction occurring within the last 10 years: No If all of the above answers are "NO", then may proceed with Cephalosporin use.     Family History: Family History  Problem Relation Age of Onset   Heart attack Brother    Stroke Brother    Aortic stenosis Mother    Arthritis Father     Social History:  reports that he has never smoked. He has never used smokeless tobacco. He reports current alcohol use of about 1.0 standard drink per week. He reports that he does not use drugs.   Physical Exam: BP 138/80   Pulse 83   Ht 5\' 7"  (1.702 m)   Wt 157 lb (71.2 kg)   BMI 24.59 kg/m   Constitutional:  Alert and oriented, No acute distress.  Accompanied by wife today. HEENT: [place]ra[place] AT, moist mucus membranes.  Trachea midline, no masses. Cardiovascular: No clubbing, cyanosis, or edema. Respiratory: Normal respiratory effort, no increased work of breathing. Skin: No rashes, bruises or suspicious lesions. Neurologic: Grossly intact, no focal deficits, moving all 4 extremities. Psychiatric: Normal mood and affect.  PVR 0  Assessment & Plan:    BPH with outlet obstruction  - Voiding independently   2. Stress urinary incontinence  - Slowly improving, continue pelvic floor PT.   3. Erectile dysfunction  - Proscribed sildenafil. If sildenafil fails he will inform us whether he would like to purse injections.   Follow-up as needed   I,Kailey Littlejohn,acting as a scribe for Hollice [place], MD.,have documented all relevant documentation on the behalf of Hollice [place]y, MD,as directed by  Hollice Espy, MD while in the presence of Hollice [place]y, MD.  I have reviewed the above documentation for accuracy and completeness, and I agree with the above.   Hollice Espy,  MD   [facility] [address], [place], [place] [address] [phone]

## 2021-02-28 ENCOUNTER — Ambulatory Visit (INDEPENDENT_AMBULATORY_CARE_PROVIDER_SITE_OTHER): Payer: Medicare Other | Admitting: Urology

## 2021-02-28 ENCOUNTER — Ambulatory Visit: Payer: Medicare Other | Admitting: Internal Medicine

## 2021-02-28 ENCOUNTER — Encounter: Payer: Self-pay | Admitting: Urology

## 2021-02-28 ENCOUNTER — Other Ambulatory Visit: Payer: Self-pay

## 2021-02-28 ENCOUNTER — Encounter: Payer: Self-pay | Admitting: Internal Medicine

## 2021-02-28 VITALS — BP 126/72 | HR 65 | Ht 67.0 in | Wt 157.8 lb

## 2021-02-28 VITALS — BP 138/80 | HR 83 | Ht 67.0 in | Wt 157.0 lb

## 2021-02-28 DIAGNOSIS — I442 Atrioventricular block, complete: Secondary | ICD-10-CM

## 2021-02-28 DIAGNOSIS — Z950 Presence of cardiac pacemaker: Secondary | ICD-10-CM

## 2021-02-28 DIAGNOSIS — I480 Paroxysmal atrial fibrillation: Secondary | ICD-10-CM

## 2021-02-28 DIAGNOSIS — N401 Enlarged prostate with lower urinary tract symptoms: Secondary | ICD-10-CM

## 2021-02-28 DIAGNOSIS — R3914 Feeling of incomplete bladder emptying: Secondary | ICD-10-CM | POA: Diagnosis not present

## 2021-02-28 LAB — CUP PACEART INCLINIC DEVICE CHECK
Battery Remaining Longevity: 61 mo
Battery Voltage: 2.96 V
Brady Statistic AP VP Percent: 96.24 %
Brady Statistic AP VS Percent: 0 %
Brady Statistic AS VP Percent: 3.75 %
Brady Statistic AS VS Percent: 0.01 %
Brady Statistic RA Percent Paced: 95.34 %
Brady Statistic RV Percent Paced: 99.98 %
Date Time Interrogation Session: 20221026122259
Implantable Lead Implant Date: 20180214
Implantable Lead Implant Date: 20180214
Implantable Lead Location: 753859
Implantable Lead Location: 753860
Implantable Lead Model: 5076
Implantable Lead Model: 5076
Implantable Pulse Generator Implant Date: 20180214
Lead Channel Impedance Value: 285 Ohm
Lead Channel Impedance Value: 323 Ohm
Lead Channel Impedance Value: 380 Ohm
Lead Channel Impedance Value: 399 Ohm
Lead Channel Pacing Threshold Amplitude: 0.5 V
Lead Channel Pacing Threshold Amplitude: 0.75 V
Lead Channel Pacing Threshold Pulse Width: 0.4 ms
Lead Channel Pacing Threshold Pulse Width: 0.4 ms
Lead Channel Sensing Intrinsic Amplitude: 18.375 mV
Lead Channel Sensing Intrinsic Amplitude: 18.375 mV
Lead Channel Sensing Intrinsic Amplitude: 2.875 mV
Lead Channel Sensing Intrinsic Amplitude: 3.125 mV
Lead Channel Setting Pacing Amplitude: 2 V
Lead Channel Setting Pacing Amplitude: 2.5 V
Lead Channel Setting Pacing Pulse Width: 0.4 ms
Lead Channel Setting Sensing Sensitivity: 2 mV

## 2021-02-28 LAB — BLADDER SCAN AMB NON-IMAGING: Scan Result: 0

## 2021-02-28 MED ORDER — SILDENAFIL CITRATE 20 MG PO TABS
ORAL_TABLET | ORAL | 3 refills | Status: DC
Start: 2021-02-28 — End: 2023-01-01

## 2021-02-28 NOTE — Patient Instructions (Signed)
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
## 2021-03-03 ENCOUNTER — Encounter: Payer: Self-pay | Admitting: Physical Therapy

## 2021-03-05 ENCOUNTER — Ambulatory Visit: Payer: Medicare Other | Admitting: Physical Therapy

## 2021-03-05 ENCOUNTER — Other Ambulatory Visit: Payer: Self-pay

## 2021-03-05 DIAGNOSIS — M533 Sacrococcygeal disorders, not elsewhere classified: Secondary | ICD-10-CM

## 2021-03-05 DIAGNOSIS — G8929 Other chronic pain: Secondary | ICD-10-CM

## 2021-03-05 DIAGNOSIS — M4125 Other idiopathic scoliosis, thoracolumbar region: Secondary | ICD-10-CM

## 2021-03-05 DIAGNOSIS — R2689 Other abnormalities of gait and mobility: Secondary | ICD-10-CM

## 2021-03-05 DIAGNOSIS — R293 Abnormal posture: Secondary | ICD-10-CM

## 2021-03-05 DIAGNOSIS — N39498 Other specified urinary incontinence: Secondary | ICD-10-CM

## 2021-03-05 DIAGNOSIS — M545 Low back pain, unspecified: Secondary | ICD-10-CM | POA: Diagnosis not present

## 2021-03-05 NOTE — Patient Instructions (Addendum)
Ankle strengthening on L with band band wrapped around outer L side of foot ballmound of L foot pressing onto band , R foot is placed on top of band hip width apart, with the ballmound ,  R hand holds the band 30 reps swinging L pinky toe out to the L  X 2x day  __  Stirrup ballmounds down first and toe spread, band under ballmounds Lower ballmounds, then heel slowly   30 reps   __   Applying Pelvic tilts:  Finding a comfortable position when laying on your back  Laying on your back, lift hips up, then scoot tail under, lowering ribs / midback first, then the low back Pillow under knees      Pelvic tilts Forward, Back, and Neutral   STANDING ( neutral is where you want to practice finding more and more often. More weight across the ball mound of feet and heels not only the heels, and not locking the knees.   SITTING: feet under knees, hip width apart. Weigh on the sitting bones. Thumb on the back iliac crest, Index finger at the front of the hip. Rock through 3 positions to find neutral, press in the feet and sense the sitting bones ( ischial tuberosity) in contact to the seat.   Posterior tilt ( thumb is lower)  Anterior tilt ( index finger is lower).   Neutral ( thumb and index finger is levelled)    Sitting at work chair that may have a dip in the seat Place folded towel/ blanket placed towards the back of the seat , sitting on sitting bones , don't lean to the back of the chair

## 2021-03-05 NOTE — Therapy (Signed)
[place] MAIN [facility] SERVICES [address] [place], [place], [address] Phone: [phone]   Fax:  [phone]  Physical Therapy Treatment  Patient Details  Name: Thomas [name] MRN: [id number] Date of Birth: 12/14/1932 Referring Provider (PT): Dawley and Erlene Quan   Encounter Date: 03/05/2021   PT End of Session - 03/05/21 1416     Visit Number 13    Date for PT Re-Evaluation 05/14/21   09/18/20, PN 02/05/21   PT Start Time 5284    PT Stop Time 1500    PT Time Calculation (min) 57 min    Activity Tolerance Patient tolerated treatment well;No increased pain    Behavior During Therapy WFL for tasks assessed/performed             Past Medical History:  Diagnosis Date   Aortic valve disorders    Arthritis    Atrial flutter ([place]) 06/18/2016   "AF or AFl; not sure which" (06/23/2016)   Basal cell carcinoma    "face, nose left shoulder, left arm" (06/19/2016)   Basal cell carcinoma 09/13/2020   right temple   BBB (bundle branch block)    hx right   Chronic back pain    "neck, thoracic, lower back" (06/19/2016)   Complete heart block ([place]) 06/2016   Dyspnea    GERD (gastroesophageal reflux disease)    Gout    Heart block    "I've had type I, II Wenke before now" (06/19/2016)   History of gout    History of hiatal hernia    "self dx'd" (06/19/2016)   Hyperlipidemia    Hypertension    Lyme disease    "dx'd by me 2003; cx's showed dx 08/2015"   Migraine    "3-4/year" (06/19/2016)   Presence of permanent cardiac pacemaker 06/19/2016   PVC's (premature ventricular contractions)    Renal cancer, left ([place]) 2006   S/P cryotherapy   Spinal stenosis    "cervical, 1 thoracic, lumbar" (06/19/2016)   Squamous carcinoma    "face, nose left shoulder, left arm" (06/19/2016)   Stroke ([place])    TIA (transient ischemic attack) 06/14/2016   "I'm not sure that's what it was" (06/25/2016)   Visit for monitoring Tikosyn therapy 09/09/2017    Past  Surgical History:  Procedure Laterality Date   ANKLE FRACTURE SURGERY Right 1967   BACK SURGERY  05/07/2020   BASAL CELL CARCINOMA EXCISION     "face, nose left shoulder, left arm"   BIOPSY PROSTATE  2001 & 2003   CARDIAC CATHETERIZATION  1990's   CARDIOVERSION N/A 09/11/2017   Procedure: CARDIOVERSION;  Surgeon: Lelon Perla, MD;  Location: [place] [facility];  Service: Cardiovascular;  Laterality: N/A;   FRACTURE SURGERY     HOLEP-LASER ENUCLEATION OF THE PROSTATE WITH MORCELLATION N/A 07/10/2020   Procedure: HOLEP-LASER ENUCLEATION OF THE PROSTATE WITH MORCELLATION;  Surgeon: Hollice Espy, MD;  Location: [facility] ORS;  Service: Urology;  Laterality: N/A;   INGUINAL HERNIA REPAIR Left 2012   INSERT / REPLACE / REMOVE PACEMAKER  06/19/2016   LAPAROSCOPIC ABLATION RENAL MASS     LEFT HEART CATH AND CORONARY ANGIOGRAPHY Left 10/23/2017   Procedure: LEFT HEART CATH AND CORONARY ANGIOGRAPHY;  Surgeon: Wellington Hampshire, MD;  Location: [place] CV LAB;  Service: Cardiovascular;  Laterality: Left;   LEFT HEART CATH AND CORONARY ANGIOGRAPHY Left 02/26/2021   Procedure: LEFT HEART CATH AND CORONARY ANGIOGRAPHY;  Surgeon: Wellington Hampshire, MD;  Location: [place] [facility] LAB;  Service: Cardiovascular;  Laterality: Left;   PACEMAKER IMPLANT N/A 06/19/2016   Procedure: Pacemaker Implant;  Surgeon: Deboraha Sprang, MD;  Location: [place] CV LAB;  Service: Cardiovascular;  Laterality: N/A;   pacemasker     PROSTATE SURGERY     SQUAMOUS CELL CARCINOMA EXCISION     "face, nose left shoulder, left arm"   TONSILLECTOMY AND ADENOIDECTOMY      There were no vitals filed for this visit.   Subjective Assessment - 03/05/21 1412     Subjective Pt 's cardiac cath procedure was one week ago and his cardiac conditions showed improvement. There was no need stents.  Pt was not allowed to dostrenuous activities until Friday. Pt started back doing his PT HEP for the past 2 days but less reps. Pt reports the  sensation to sense the urge to urinate is a "whisker better".                [facility] PT Assessment - 03/05/21 1442       Observation/Other Assessments   Observations minimal anterior tilt pelvis, use of ab for propioception of pelvic anterior tilt      Sensation   Additional Comments equal sensation along B feet L compared R.  Equal sensation along supra pubic area      Strength   Overall Strength Comments heel raises 20 reps L, 15 reps R MMT 3/5, B hip flex/knee flex/ext, L DF/EV 3/5, R 4+/5      Palpation   Palpation comment fascial restrictions at low ab, minimial anterior tilt of pelvis                           [facility] Adult PT Treatment/Exercise - 03/05/21 1649       Therapeutic Activites    Other Therapeutic Activities reassessed goals      Neuro Re-ed    Neuro Re-ed Details  cued fro pelvic tilts with tactile and verbal cues      Manual Therapy   Manual therapy comments fascial mobilization over suprapubic area with lower trunk movements to faciliate pelvic tilts                          PT Long Term Goals - 03/05/21 1417       PT LONG TERM GOAL #1   Title Pt will complete a chart detailing the method he uses for continence in relationship with activities across each day of the week in order to gather data for baseline    Time 2    Period Weeks    Status Achieved      PT LONG TERM GOAL #2   Title Pt will improve gait mechanics and posture  to increase walking endurance from 20 min to > 30 min in order to walk with less pain    Time 6    Period Weeks    Status Achieved      PT LONG TERM GOAL #3   Title Pt will demo less forward head posture from 30 cm from earlobe to wall to < 25 cm in order to achieve more upright posture to optimize IAP system for postural stability ( less LBP) and urinary continence 12/19/20: 26 cm  03/05/21: 24 cm)    Time 8    Period Weeks    Status Achieved      PT LONG TERM GOAL #4   Title Pt will  demo IND with deep core coordination and pelvic floor coordination without compensatory patterns to help with ADLs    Time 4    Period Weeks    Status Achieved      PT LONG TERM GOAL #5   Title Pt will demo less L thoracic shift, less convex curve at T/L junction and more reciprocal gait pattern in order to regain structural midline and to progress to pelvic floor contractions with better outcomes    Time 10    Period Weeks    Status Achieved      Additional Long Term Goals   Additional Long Term Goals Yes      PT LONG TERM GOAL #6   Title Pt will report decreased pain by 50% with Bending over, getting out of bed, reaching up overhead activities in order to perform his hobbies and household chores.    Time 8    Period Weeks    Status Achieved      PT LONG TERM GOAL #7   Title Pt will demo proper deep core coordination, pelvic floor contractions 3 sec, 3 reps in order to to minimize leakage while gardening    Baseline chest breathing ( limited anterior diaphragmatic breathing)    Time 8    Period Weeks    Status Achieved      PT LONG TERM GOAL #8   Title Pt will report getting the sensation of fullness of bladder and being able to empty completely across 2 weeks    Time 10    Period Weeks    Status New    Target Date 05/14/21                   Plan - 03/05/21 1416     Clinical Impression Statement Pt has achieved 7/8 goals and progressing towards remaining goals.  Pt;s continence and LBP is improving. Pt has demo'd significant changes to his severe thoracic kyphosis./ forward head posture, scoliotic curves/ uneven pelvic girdle alignment with manual Tx and neuromuscular /therapeutic exercises. These improvements have positively impacted his IAP system for spinal stability and pelvic functions. Pt has advanced to pelvic floor contractions. Currently working on improving sensation for full bladder. Also working on improving LLE weakness 2/2 to Hx of past stroke and  improving balance to help pt prepare to return to jog/running as pt was an avid runner in the past. Planning to continue to improving pt's IAP system with pelvic strengthening and lower kinetic chain will also prep pt to return to running with less load on bladder related to SUI.  Pt continues to benefit from skilled PT.     Personal Factors and Comorbidities Comorbidity 3+    Comorbidities Lumbar fusion L1-3    Examination-Activity Limitations Toileting;Lift;Bed Mobility;Locomotion Level;Bend;Sit;Sleep;Continence;Squat;Stand;Transfers;Reach Overhead    Stability/Clinical Decision Making Evolving/Moderate complexity    Rehab Potential Good    PT Frequency 1x / week    PT Duration Other (comment)   10   PT Treatment/Interventions Moist Heat;Therapeutic activities;Therapeutic exercise;Patient/family education;Neuromuscular re-education;Stair training;Gait training;Manual techniques;Taping;Splinting;Dry needling;Balance training;ADLs/Self Care Home Management;Cryotherapy;Traction;Functional mobility training;Energy conservation;Joint Manipulations;Passive range of motion;Scar mobilization    Consulted and Agree with Plan of Care Patient;Family member/caregiver    Family Member Consulted wife             Patient will benefit from skilled therapeutic intervention in order to improve the following deficits and impairments:  Decreased activity tolerance, Decreased endurance, Decreased range of motion, Decreased strength, Decreased coordination, Decreased mobility, Decreased  scar mobility, Abnormal gait, Increased fascial restricitons, Impaired sensation, Improper body mechanics, Pain, Hypermobility, Increased muscle spasms, Hypomobility, Difficulty walking, Decreased knowledge of precautions, Decreased balance, Postural dysfunction, Cardiopulmonary status limiting activity, Decreased safety awareness  Visit Diagnosis: Other idiopathic scoliosis, thoracolumbar region  Abnormal posture  Other  abnormalities of gait and mobility  Other urinary incontinence  Chronic low back pain without sciatica, unspecified back pain laterality  Sacrococcygeal disorders, not elsewhere classified     Problem List Patient Active Problem List   Diagnosis Date Noted   Unstable angina ([place])    Lumbar burst fracture ([place]) 05/04/2020   Hypogonadism male 10/10/2019   Persistent atrial fibrillation ([place]) 01/12/2019   Acute low back pain 11/04/2017   Abnormal screening cardiac CT    Visit for monitoring Tikosyn therapy 09/09/2017   History of stroke 08/29/2016   Paroxysmal atrial fibrillation ([place]) 07/23/2016   Coronary artery disease 07/23/2016   Snores 06/27/2016   Cardiac pacemaker in situ 06/25/2016   Hemiparesis ([place]) 06/25/2016   Acute ischemic stroke ([place])    Acute left hemiparesis (HCC)    Hemisensory loss    Dysarthria    Stroke-like symptoms    Complete heart block (HCC)    Pain in thoracic spine    Orthostasis    Lethargy    Occlusion of vertebral artery    TIA (transient ischemic attack) 06/14/2016   Arthritis 02/06/2015   Esophageal reflux 02/06/2015   Arthritis urica 02/06/2015   Cannot sleep 02/06/2015   Arthritis, degenerative 02/06/2015   Adenocarcinoma, renal cell ([place]) 02/06/2015   Benign prostatic hyperplasia with lower urinary tract symptoms 11/21/2014   Personal history of other malignant neoplasm of kidney 11/21/2014   Palpitations 12/31/2013   Hyperlipidemia 11/02/2010   HYPERTENSION, BENIGN 04/16/2010    Jerl Mina, PT 03/05/2021, 6:32 PM  [place] MAIN [facility] SERVICES [address] [place], [place], [address] Phone: [phone]   Fax:  [phone]  Name: Thomas [name] MRN: [id number] Date of Birth: [birth year]

## 2021-03-12 ENCOUNTER — Other Ambulatory Visit: Payer: Self-pay

## 2021-03-12 ENCOUNTER — Ambulatory Visit: Payer: Medicare Other | Attending: Urology | Admitting: Physical Therapy

## 2021-03-12 DIAGNOSIS — M4125 Other idiopathic scoliosis, thoracolumbar region: Secondary | ICD-10-CM | POA: Insufficient documentation

## 2021-03-12 DIAGNOSIS — N39498 Other specified urinary incontinence: Secondary | ICD-10-CM | POA: Insufficient documentation

## 2021-03-12 DIAGNOSIS — M545 Low back pain, unspecified: Secondary | ICD-10-CM | POA: Insufficient documentation

## 2021-03-12 DIAGNOSIS — M533 Sacrococcygeal disorders, not elsewhere classified: Secondary | ICD-10-CM | POA: Insufficient documentation

## 2021-03-12 DIAGNOSIS — G8929 Other chronic pain: Secondary | ICD-10-CM | POA: Diagnosis not present

## 2021-03-12 DIAGNOSIS — R2689 Other abnormalities of gait and mobility: Secondary | ICD-10-CM | POA: Insufficient documentation

## 2021-03-12 DIAGNOSIS — R293 Abnormal posture: Secondary | ICD-10-CM | POA: Insufficient documentation

## 2021-03-12 NOTE — Patient Instructions (Signed)
Practice proper pelvic floor coordination  Inhale: expand pelvic floor muscles Exhale" "j" scoop, allow pelvic floor to close, lift first before belly sinks   ( not "draw abdominal muscle to spine" or strain with abdominal muscles")

## 2021-03-12 NOTE — Therapy (Signed)
[place] MAIN [facility] SERVICES [address] [place], [place], [address] Phone: [phone]   Fax:  [phone]  Physical Therapy Treatment  Patient Details  Name: Thomas [name] MRN: [id number] Date of Birth: 11/21/1932 Referring Provider (PT): Dawley and Erlene Quan   Encounter Date: 03/12/2021   PT End of Session - 03/12/21 1505     Visit Number 14    Date for PT Re-Evaluation 05/14/21   09/18/20, PN 02/05/21   PT Start Time 1404    PT Stop Time 1505    PT Time Calculation (min) 61 min    Activity Tolerance Patient tolerated treatment well;No increased pain    Behavior During Therapy WFL for tasks assessed/performed             Past Medical History:  Diagnosis Date   Aortic valve disorders    Arthritis    Atrial flutter ([place]) 06/18/2016   "AF or AFl; not sure which" (06/23/2016)   Basal cell carcinoma    "face, nose left shoulder, left arm" (06/19/2016)   Basal cell carcinoma 09/13/2020   right temple   BBB (bundle branch block)    hx right   Chronic back pain    "neck, thoracic, lower back" (06/19/2016)   Complete heart block ([place]) 06/2016   Dyspnea    GERD (gastroesophageal reflux disease)    Gout    Heart block    "I've had type I, II Wenke before now" (06/19/2016)   History of gout    History of hiatal hernia    "self dx'd" (06/19/2016)   Hyperlipidemia    Hypertension    Lyme disease    "dx'd by me 2003; cx's showed dx 08/2015"   Migraine    "3-4/year" (06/19/2016)   Presence of permanent cardiac pacemaker 06/19/2016   PVC's (premature ventricular contractions)    Renal cancer, left ([place]) 2006   S/P cryotherapy   Spinal stenosis    "cervical, 1 thoracic, lumbar" (06/19/2016)   Squamous carcinoma    "face, nose left shoulder, left arm" (06/19/2016)   Stroke ([place])    TIA (transient ischemic attack) 06/14/2016   "I'm not sure that's what it was" (06/25/2016)   Visit for monitoring Tikosyn therapy 09/09/2017    Past  Surgical History:  Procedure Laterality Date   ANKLE FRACTURE SURGERY Right 1967   BACK SURGERY  05/07/2020   BASAL CELL CARCINOMA EXCISION     "face, nose left shoulder, left arm"   BIOPSY PROSTATE  2001 & 2003   CARDIAC CATHETERIZATION  1990's   CARDIOVERSION N/A 09/11/2017   Procedure: CARDIOVERSION;  Surgeon: Lelon Perla, MD;  Location: [place] ENDOSCOPY;  Service: Cardiovascular;  Laterality: N/A;   FRACTURE SURGERY     HOLEP-LASER ENUCLEATION OF THE PROSTATE WITH MORCELLATION N/A 07/10/2020   Procedure: HOLEP-LASER ENUCLEATION OF THE PROSTATE WITH MORCELLATION;  Surgeon: Hollice Espy, MD;  Location: [facility] ORS;  Service: Urology;  Laterality: N/A;   INGUINAL HERNIA REPAIR Left 2012   INSERT / REPLACE / REMOVE PACEMAKER  06/19/2016   LAPAROSCOPIC ABLATION RENAL MASS     LEFT HEART CATH AND CORONARY ANGIOGRAPHY Left 10/23/2017   Procedure: LEFT HEART CATH AND CORONARY ANGIOGRAPHY;  Surgeon: Wellington Hampshire, MD;  Location: [place] CV LAB;  Service: Cardiovascular;  Laterality: Left;   LEFT HEART CATH AND CORONARY ANGIOGRAPHY Left 02/26/2021   Procedure: LEFT HEART CATH AND CORONARY ANGIOGRAPHY;  Surgeon: Wellington Hampshire, MD;  Location: [place] CV LAB;  Service: Cardiovascular;  Laterality: Left;   PACEMAKER IMPLANT N/A 06/19/2016   Procedure: Pacemaker Implant;  Surgeon: Deboraha Sprang, MD;  Location: [place] CV LAB;  Service: Cardiovascular;  Laterality: N/A;   pacemasker     PROSTATE SURGERY     SQUAMOUS CELL CARCINOMA EXCISION     "face, nose left shoulder, left arm"   TONSILLECTOMY AND ADENOIDECTOMY      There were no vitals filed for this visit.   Subjective Assessment - 03/12/21 1412     Subjective Pt is getting a hint of his sphincter muscles and is feeling more than a month ago. But not able to squeeze and tighten the sphincter  muscles                            Pelvic Floor Special Questions - 03/12/21 1546     External Palpation L  pelvic floor tightness. less activated with cue for contraction , overuse of oblique mm , dyscoordination of pelvic floor ,, overuse of adductor/ ab mm,  semi reclined: 3 reps 1 sec with proper technique               [facility] Adult PT Treatment/Exercise - 03/12/21 1548       Neuro Re-ed    Neuro Re-ed Details  cued for less oblique, adductor overuse with pelvic floor activation,      Modalities   Modalities Moist Heat      Moist Heat Therapy   Number Minutes Moist Heat 5 Minutes    Moist Heat Location --   perineum     Manual Therapy   Manual therapy comments STM/MWM at L pelvic floor                          PT Long Term Goals - 03/05/21 1417       PT LONG TERM GOAL #1   Title Pt will complete a chart detailing the method he uses for continence in relationship with activities across each day of the week in order to gather data for baseline    Time 2    Period Weeks    Status Achieved      PT LONG TERM GOAL #2   Title Pt will improve gait mechanics and posture  to increase walking endurance from 20 min to > 30 min in order to walk with less pain    Time 6    Period Weeks    Status Achieved      PT LONG TERM GOAL #3   Title Pt will demo less forward head posture from 30 cm from earlobe to wall to < 25 cm in order to achieve more upright posture to optimize IAP system for postural stability ( less LBP) and urinary continence 12/19/20: 26 cm  03/05/21: 24 cm)    Time 8    Period Weeks    Status Achieved      PT LONG TERM GOAL #4   Title Pt will demo IND with deep core coordination and pelvic floor coordination without compensatory patterns to help with ADLs    Time 4    Period Weeks    Status Achieved      PT LONG TERM GOAL #5   Title Pt will demo less L thoracic shift, less convex curve at T/L junction and more reciprocal gait pattern in order to regain structural midline and to progress to  pelvic floor contractions with better outcomes    Time 10     Period Weeks    Status Achieved      Additional Long Term Goals   Additional Long Term Goals Yes      PT LONG TERM GOAL #6   Title Pt will report decreased pain by 50% with Bending over, getting out of bed, reaching up overhead activities in order to perform his hobbies and household chores.    Time 8    Period Weeks    Status Achieved      PT LONG TERM GOAL #7   Title Pt will demo proper deep core coordination, pelvic floor contractions 3 sec, 3 reps in order to to minimize leakage while gardening    Baseline chest breathing ( limited anterior diaphragmatic breathing)    Time 8    Period Weeks    Status Achieved      PT LONG TERM GOAL #8   Title Pt will report getting the sensation of fullness of bladder and being able to empty completely across 2 weeks    Time 10    Period Weeks    Status New    Target Date 05/14/21                   Plan - 03/12/21 1546     Clinical Impression Statement Pt required external manual Tx to minimize L pelvic floor mm tightness. Pt demo'd improved L sided activation of pelvic floor post Tx. Pt required cued for less oblique and adductor overuse and demo'd more isolated pelvic floor mm activation. Pt continues to benefit from skilled PT.    Personal Factors and Comorbidities Comorbidity 3+    Comorbidities Lumbar fusion L1-3    Examination-Activity Limitations Toileting;Lift;Bed Mobility;Locomotion Level;Bend;Sit;Sleep;Continence;Squat;Stand;Transfers;Reach Overhead    Stability/Clinical Decision Making Evolving/Moderate complexity    Rehab Potential Good    PT Frequency 1x / week    PT Duration Other (comment)   10   PT Treatment/Interventions Moist Heat;Therapeutic activities;Therapeutic exercise;Patient/family education;Neuromuscular re-education;Stair training;Gait training;Manual techniques;Taping;Splinting;Dry needling;Balance training;ADLs/Self Care Home Management;Cryotherapy;Traction;Functional mobility training;Energy  conservation;Joint Manipulations;Passive range of motion;Scar mobilization    Consulted and Agree with Plan of Care Patient;Family member/caregiver    Family Member Consulted wife             Patient will benefit from skilled therapeutic intervention in order to improve the following deficits and impairments:  Decreased activity tolerance, Decreased endurance, Decreased range of motion, Decreased strength, Decreased coordination, Decreased mobility, Decreased scar mobility, Abnormal gait, Increased fascial restricitons, Impaired sensation, Improper body mechanics, Pain, Hypermobility, Increased muscle spasms, Hypomobility, Difficulty walking, Decreased knowledge of precautions, Decreased balance, Postural dysfunction, Cardiopulmonary status limiting activity, Decreased safety awareness  Visit Diagnosis: No diagnosis found.     Problem List Patient Active Problem List   Diagnosis Date Noted   Unstable angina ([place])    Lumbar burst fracture ([place]) 05/04/2020   Hypogonadism male 10/10/2019   Persistent atrial fibrillation ([place]) 01/12/2019   Acute low back pain 11/04/2017   Abnormal screening cardiac CT    Visit for monitoring Tikosyn therapy 09/09/2017   History of stroke 08/29/2016   Paroxysmal atrial fibrillation ([place]) 07/23/2016   Coronary artery disease 07/23/2016   Snores 06/27/2016   Cardiac pacemaker in situ 06/25/2016   Hemiparesis ([place]) 06/25/2016   Acute ischemic stroke ([place])    Acute left hemiparesis (HCC)    Hemisensory loss    Dysarthria    Stroke-like symptoms    Complete  heart block (HCC)    Pain in thoracic spine    Orthostasis    Lethargy    Occlusion of vertebral artery    TIA (transient ischemic attack) 06/14/2016   Arthritis 02/06/2015   Esophageal reflux 02/06/2015   Arthritis urica 02/06/2015   Cannot sleep 02/06/2015   Arthritis, degenerative 02/06/2015   Adenocarcinoma, renal cell ([place]) 02/06/2015   Benign prostatic hyperplasia with lower urinary  tract symptoms 11/21/2014   Personal history of other malignant neoplasm of kidney 11/21/2014   Palpitations 12/31/2013   Hyperlipidemia 11/02/2010   HYPERTENSION, BENIGN 04/16/2010    Jerl Mina, PT 03/12/2021, 3:50 PM  [place] MAIN [facility] SERVICES [address] [place], [place], [address] Phone: [phone]   Fax:  [phone]  Name: Thomas [name] MRN: [id number] Date of Birth: 1933/02/26

## 2021-03-19 ENCOUNTER — Ambulatory Visit: Payer: Medicare Other | Admitting: Physical Therapy

## 2021-03-19 DIAGNOSIS — S32029G Unspecified fracture of second lumbar vertebra, subsequent encounter for fracture with delayed healing: Secondary | ICD-10-CM | POA: Diagnosis not present

## 2021-03-19 DIAGNOSIS — Z981 Arthrodesis status: Secondary | ICD-10-CM | POA: Diagnosis not present

## 2021-03-19 DIAGNOSIS — I10 Essential (primary) hypertension: Secondary | ICD-10-CM | POA: Diagnosis not present

## 2021-03-20 ENCOUNTER — Other Ambulatory Visit: Payer: Self-pay

## 2021-03-20 ENCOUNTER — Ambulatory Visit (INDEPENDENT_AMBULATORY_CARE_PROVIDER_SITE_OTHER): Payer: Medicare Other | Admitting: Family Medicine

## 2021-03-20 VITALS — BP 109/63 | HR 78 | Temp 97.8°F | Ht 67.0 in | Wt 157.3 lb

## 2021-03-20 DIAGNOSIS — I4819 Other persistent atrial fibrillation: Secondary | ICD-10-CM

## 2021-03-20 DIAGNOSIS — Z23 Encounter for immunization: Secondary | ICD-10-CM | POA: Diagnosis not present

## 2021-03-20 DIAGNOSIS — I10 Essential (primary) hypertension: Secondary | ICD-10-CM | POA: Diagnosis not present

## 2021-03-20 DIAGNOSIS — K219 Gastro-esophageal reflux disease without esophagitis: Secondary | ICD-10-CM

## 2021-03-20 DIAGNOSIS — M810 Age-related osteoporosis without current pathological fracture: Secondary | ICD-10-CM

## 2021-03-20 DIAGNOSIS — C642 Malignant neoplasm of left kidney, except renal pelvis: Secondary | ICD-10-CM

## 2021-03-20 DIAGNOSIS — S32001D Stable burst fracture of unspecified lumbar vertebra, subsequent encounter for fracture with routine healing: Secondary | ICD-10-CM

## 2021-03-20 DIAGNOSIS — Z8673 Personal history of transient ischemic attack (TIA), and cerebral infarction without residual deficits: Secondary | ICD-10-CM

## 2021-03-20 DIAGNOSIS — N401 Enlarged prostate with lower urinary tract symptoms: Secondary | ICD-10-CM

## 2021-03-20 NOTE — Progress Notes (Signed)
Established patient visit   Patient: Thomas [name]   DOB: 11-09-1932   87 y.o. Male  MRN: [id number] Visit Date: 03/20/2021  Today's healthcare provider: [name], MD   Chief Complaint  Patient presents with   Follow-up   Hypertension   Subjective    HPI  Patient comes in today for follow-up of chronic medical problems.  Over a year ago that he had his tractor accident that required an of his bar spine because of the traumatic fracture.  Surgeon told him at the time that his bones seem to be soft.  He has not had a BMD yet. His wife is just had a total knee replacement so is at home recovering from that. Is only new issue today is 1 of mild memory loss at 87 years old.  It is not a big issue at this time.  Hypertension, follow-up  BP Readings from Last 3 Encounters:  03/20/21 109/63  02/28/21 138/80  02/28/21 126/72   Wt Readings from Last 3 Encounters:  03/20/21 157 lb 4.8 oz (71.4 kg)  02/28/21 157 lb (71.2 kg)  02/28/21 157 lb 12.8 oz (71.6 kg)     He was last seen for hypertension 5 months ago.  BP at that visit was 131/68. Management since that visit includes; Good control- CBC with Differential/Platelet--labs stable. He reports good compliance with treatment. He is not having side effects. none He is not exercising. He is not adherent to low salt diet.   Outside blood pressures are 120-145.  He does not smoke.  Use of agents associated with hypertension: none.   ---------------------------------------------------------------------------------------------------     Medications: Outpatient Medications Prior to Visit  Medication Sig   amLODipine (NORVASC) 5 MG tablet TAKE 1 TABLET BY MOUTH DAILY   apixaban (ELIQUIS) 2.5 MG TABS tablet Take 2.5 mg by mouth 2 (two) times daily.   atorvastatin (LIPITOR) 40 MG tablet Take 1 tablet (40 mg total) by mouth daily.   calcium carbonate (TUMS - DOSED IN MG ELEMENTAL CALCIUM) 500 MG chewable tablet Chew  1-2 tablets by mouth daily as needed for indigestion.   Cholecalciferol (VITAMIN D3) 5000 units CAPS Take 5,000 Units by mouth daily.   Coenzyme Q10 (COQ10) 100 MG CAPS Take 100 mg by mouth daily.   Cyanocobalamin (VITAMIN B-12 PO) Take 3,000 mcg by mouth daily with breakfast.   dofetilide (TIKOSYN) 125 MCG capsule Take 1 capsule (125 mcg total) by mouth 2 (two) times daily.   febuxostat (ULORIC) 40 MG tablet Take 1 tablet (40 mg total) by mouth daily.   gabapentin (NEURONTIN) 300 MG capsule Take 300 mg by mouth 2 (two) times daily.   Magnesium 500 MG TABS Take 500 mg by mouth at bedtime.   methocarbamol (ROBAXIN) 750 MG tablet Take 750 mg by mouth 2 times daily at 12 noon and 4 pm.   metoprolol succinate (TOPROL-XL) 50 MG 24 hr tablet TAKE 1 TABLET BY MOUTH DAILY WITH OR IMMEDIATELY FOLLOWING A MEAL   metroNIDAZOLE (FLAGYL) 500 MG tablet TAKE 1 TABLET BY MOUTH TWICE DAILY ON SUNDAY AND MONDAY   mirabegron ER (MYRBETRIQ) 25 MG TB24 tablet Take 1 tablet (25 mg total) by mouth daily.   Multiple Vitamins-Minerals (PRESERVISION/LUTEIN PO) Take 1 capsule by mouth in the morning.   NEEDLE, DISP, 18 G (BD DISP NEEDLES) 18G X 1-1/2" MISC 1 mg by Does not apply route every 14 (fourteen) days.   NEEDLE, DISP, 21 G (BD DISP NEEDLES) 21G  X 1-1/2" MISC 1 mg by Does not apply route every 14 (fourteen) days.   oxyCODONE (OXY IR/ROXICODONE) 5 MG immediate release tablet Take 1 tablet (5 mg total) by mouth every 6 (six) hours as needed for moderate pain or breakthrough pain.   oxyCODONE-acetaminophen (PERCOCET) 7.5-325 MG tablet Take 1-2 tablets by mouth every 4 (four) hours as needed for severe pain.   polyethylene glycol (MIRALAX / GLYCOLAX) 17 g packet Take 17 g by mouth daily as needed for moderate constipation.   predniSONE (DELTASONE) 50 MG tablet Take 10/23 at 8:30 pm, 10/24 at 02:30 am, and 10/24 at 08:30 am (take with Benadryl 50 mg at 08:30 am)   propranolol (INDERAL) 40 MG tablet TAKE ONE (1) TABLET  THREE (3) TIMES EACHDAY AS NEEDED   senna (SENOKOT) 8.6 MG tablet Take 1 tablet by mouth 2 (two) times daily as needed for constipation.   sildenafil (REVATIO) 20 MG tablet Take 3-5 tablets 1 hr prior   Syringe, Disposable, (2-3CC SYRINGE) 3 ML MISC 1 mg by Does not apply route every 14 (fourteen) days.   tamsulosin (FLOMAX) 0.4 MG CAPS capsule Take 0.4 mg by mouth at bedtime.   terazosin (HYTRIN) 1 MG capsule Take 1 mg by mouth at bedtime.   Testosterone 20.25 MG/ACT (1.62%) GEL APPLY 2 PUMPS DAILY.   testosterone cypionate (DEPOTESTOSTERONE CYPIONATE) 200 MG/ML injection Inject 1 mL (200 mg total) into the muscle every 28 (twenty-eight) days.   traMADol (ULTRAM) 50 MG tablet Take 50-100 mg by mouth 4 (four) times daily as needed for severe pain.   Turmeric 500 MG CAPS Take 500 mg by mouth daily.   No facility-administered medications prior to visit.    Review of Systems  Constitutional:  Negative for appetite change, chills and fever.  Respiratory:  Negative for chest tightness, shortness of breath and wheezing.   Cardiovascular:  Negative for chest pain and palpitations.  Gastrointestinal:  Negative for abdominal pain, nausea and vomiting.      Objective    BP 109/63 (BP Location: Right Arm, Patient Position: Sitting, Cuff Size: Normal)   Pulse 78   Temp 97.8 F (36.6 C)   Ht 5\' 7"  (1.702 m)   Wt 157 lb 4.8 oz (71.4 kg)   SpO2 99%   BMI 24.64 kg/m  {Show previous vital signs (optional):23777}  Physical Exam Vitals reviewed.  Constitutional:      Appearance: Normal appearance. He is well-developed.     Comments: He appears younger than his age of 75  HENT:     Head: Normocephalic and atraumatic.     Right Ear: External ear normal.     Left Ear: External ear normal.  Eyes:     General: No scleral icterus.    Conjunctiva/sclera: Conjunctivae normal.     Pupils: Pupils are equal, round, and reactive to light.  Cardiovascular:     Rate and Rhythm: Normal rate and regular  rhythm.     Heart sounds: Normal heart sounds.  Pulmonary:     Effort: Pulmonary effort is normal. No respiratory distress.     Breath sounds: Normal breath sounds.  Abdominal:     Palpations: Abdomen is soft.  Genitourinary:    Rectum: Guaiac result negative.  Musculoskeletal:        General: No tenderness.     Cervical back: Normal range of motion and neck supple.  Skin:    Findings: No erythema or rash.  Neurological:     Mental Status: He is alert  and oriented to person, place, and time.  Psychiatric:        Behavior: Behavior normal.        Thought Content: Thought content normal.        Judgment: Judgment normal.    1. Essential hypertension Good blood pressure control.  On amlodipine 5 Toprol 50  2. Need for influenza vaccination  - Flu Vaccine QUAD High Dose(Fluad)  3. Osteoporosis, unspecified osteoporosis type, unspecified pathological fracture presence Obtain bone density. - DG Bone Density Note patient continues to be on testosterone injections monthly 4. HYPERTENSION, BENIGN   5. Persistent atrial fibrillation (HCC) On Eliquis  6. Gastroesophageal reflux disease without esophagitis   7. Closed burst fracture of lumbar vertebra with routine healing, subsequent encounter From severe trauma from a tractor accident.  Well with no neurologic deficits  8. Renal cell carcinoma of left kidney ([place]) By urology  9. Benign prostatic hyperplasia with lower urinary tract symptoms, symptom details unspecified   10. History of stroke Clinically stable.  Mild cognitive impairment discussed and discussed patient could possibly start [place] fish oil daily only Eliquis I told him to be careful with bleeding risk.   No results found for any visits on 03/20/21.  Assessment & Plan  DG Bone Density      Return in about 4 months (around 07/18/2021).      I, [name], MD, have reviewed all documentation for this visit. The documentation on 03/24/21 for the  exam, diagnosis, procedures, and orders are all accurate and complete.    Adaline Trejos Cranford Mon, MD  [facility] [phone] (phone) [phone] (fax)  [place]

## 2021-03-20 NOTE — Patient Instructions (Signed)
RESEARCH DHA FISH OIL FOR MEMORY.

## 2021-03-26 ENCOUNTER — Other Ambulatory Visit: Payer: Self-pay

## 2021-03-26 ENCOUNTER — Ambulatory Visit: Payer: Medicare Other | Admitting: Physical Therapy

## 2021-03-26 DIAGNOSIS — M533 Sacrococcygeal disorders, not elsewhere classified: Secondary | ICD-10-CM

## 2021-03-26 DIAGNOSIS — R2689 Other abnormalities of gait and mobility: Secondary | ICD-10-CM

## 2021-03-26 DIAGNOSIS — M4125 Other idiopathic scoliosis, thoracolumbar region: Secondary | ICD-10-CM

## 2021-03-26 DIAGNOSIS — M545 Low back pain, unspecified: Secondary | ICD-10-CM

## 2021-03-26 DIAGNOSIS — R293 Abnormal posture: Secondary | ICD-10-CM | POA: Diagnosis not present

## 2021-03-26 DIAGNOSIS — G8929 Other chronic pain: Secondary | ICD-10-CM | POA: Diagnosis not present

## 2021-03-26 DIAGNOSIS — N39498 Other specified urinary incontinence: Secondary | ICD-10-CM

## 2021-03-26 NOTE — Patient Instructions (Signed)
   ___   [employer]   Ski track stance, lunge  Front knee above ankle and not moving -stable  with back heel up,  Pelvic ferris wheel moves in a small circle ( 2-3" diameter ) from the power push upward by the back ballmound  __  Less straining with urination:    Anterior tilt of pelvis with squatty potty stool   Anterior tilt standing when peeing ( COM more forward)   __ Deep core with anterior tilt of pelvis with feet firm  __

## 2021-03-27 ENCOUNTER — Other Ambulatory Visit: Payer: Self-pay | Admitting: Family Medicine

## 2021-03-27 DIAGNOSIS — M109 Gout, unspecified: Secondary | ICD-10-CM

## 2021-03-27 NOTE — Therapy (Addendum)
[place] MAIN [facility] SERVICES [address] [place], [place], [address] Phone: [phone]   Fax:  [phone]  Physical Therapy Treatment  Patient Details  Name: Thomas [name] MRN: [id number] Date of Birth: 1932/11/16 Referring Provider (PT): Dawley and Erlene Quan   Encounter Date: 03/26/2021   PT End of Session - 03/26/21 1607     Visit Number 15    Date for PT Re-Evaluation 05/14/21   09/18/20, PN 02/05/21   PT Start Time 0347    PT Stop Time 4259    PT Time Calculation (min) 70 min    Activity Tolerance Patient tolerated treatment well;No increased pain    Behavior During Therapy WFL for tasks assessed/performed             Past Medical History:  Diagnosis Date   Aortic valve disorders    Arthritis    Atrial flutter ([place]) 06/18/2016   "AF or AFl; not sure which" (06/23/2016)   Basal cell carcinoma    "face, nose left shoulder, left arm" (06/19/2016)   Basal cell carcinoma 09/13/2020   right temple   BBB (bundle branch block)    hx right   Chronic back pain    "neck, thoracic, lower back" (06/19/2016)   Complete heart block ([place]) 06/2016   Dyspnea    GERD (gastroesophageal reflux disease)    Gout    Heart block    "I've had type I, II [place] before now" (06/19/2016)   History of gout    History of hiatal hernia    "self dx'd" (06/19/2016)   Hyperlipidemia    Hypertension    Lyme disease    "dx'd by me 2003; cx's showed dx 08/2015"   Migraine    "3-4/year" (06/19/2016)   Presence of permanent cardiac pacemaker 06/19/2016   PVC's (premature ventricular contractions)    Renal cancer, left ([place]) 2006   S/P cryotherapy   Spinal stenosis    "cervical, 1 thoracic, lumbar" (06/19/2016)   Squamous carcinoma    "face, nose left shoulder, left arm" (06/19/2016)   Stroke ([place])    TIA (transient ischemic attack) 06/14/2016   "I'm not sure that's what it was" (06/25/2016)   Visit for monitoring Tikosyn therapy 09/09/2017    Past  Surgical History:  Procedure Laterality Date   ANKLE FRACTURE SURGERY Right 1967   BACK SURGERY  05/07/2020   BASAL CELL CARCINOMA EXCISION     "face, nose left shoulder, left arm"   BIOPSY PROSTATE  2001 & 2003   CARDIAC CATHETERIZATION  1990's   CARDIOVERSION N/A 09/11/2017   Procedure: CARDIOVERSION;  Surgeon: Lelon Perla, MD;  Location: [place] [facility];  Service: Cardiovascular;  Laterality: N/A;   FRACTURE SURGERY     HOLEP-LASER ENUCLEATION OF THE PROSTATE WITH MORCELLATION N/A 07/10/2020   Procedure: HOLEP-LASER ENUCLEATION OF THE PROSTATE WITH MORCELLATION;  Surgeon: Hollice Espy, MD;  Location: [facility];  Service: Urology;  Laterality: N/A;   INGUINAL HERNIA REPAIR Left 2012   INSERT / REPLACE / REMOVE PACEMAKER  06/19/2016   LAPAROSCOPIC ABLATION RENAL MASS     LEFT HEART CATH AND CORONARY ANGIOGRAPHY Left 10/23/2017   Procedure: LEFT HEART CATH AND CORONARY ANGIOGRAPHY;  Surgeon: Wellington Hampshire, MD;  Location: [place] [facility];  Service: Cardiovascular;  Laterality: Left;   LEFT HEART CATH AND CORONARY ANGIOGRAPHY Left 02/26/2021   Procedure: LEFT HEART CATH AND CORONARY ANGIOGRAPHY;  Surgeon: Wellington Hampshire, MD;  Location: [place] [facility];  Service: Cardiovascular;  Laterality: Left;   PACEMAKER IMPLANT N/A 06/19/2016   Procedure: Pacemaker Implant;  Surgeon: Deboraha Sprang, MD;  Location: [place] [facility];  Service: Cardiovascular;  Laterality: N/A;   pacemasker     PROSTATE SURGERY     SQUAMOUS CELL CARCINOMA EXCISION     "face, nose left shoulder, left arm"   TONSILLECTOMY AND ADENOIDECTOMY      There were no vitals filed for this visit.   Subjective Assessment - 03/26/21 1612     Subjective Pt notices he can feel sensation on his L pelvic area/ groin area but he says he still feels numb at S4-5 in the saddle area. Pt is still leaking constantly when up and raking leaves. Pt is no longer leaking when waking and able to walk to the bathroom. Pt has to  strain with urination. Pt stands 70% of the time, sits 30% of the time when urinating.                [facility] PT Assessment - 03/27/21 1252       Observation/Other Assessments   Observations slumped sitting      Sensation   Additional Comments decreased sensation at T11-12 over supra pubic, S2-3 at posterior pelvic floor to soft/ dull texture      Palpation   SI assessment  coccyx . SIJ hypomobile lacking nutation    Palpation comment fascial restrictions of upper ab/ supra pubic/ anterior pelvic floor                           [facility] Adult PT Treatment/Exercise - 03/27/21 1249       Therapeutic Activites    Other Therapeutic Activities explained importance of correcting posture/ no more slump sitting, showed anatomy/ physiology and nerve innervation      Neuro Re-ed    Neuro Re-ed Details  cued for anterior / posterior tilt of pelvis in hooklying,standing      Manual Therapy   Manual therapy comments fascial relases over diaphragm, obliques to promote more excursion/ less oblique mm overuse, fascial glides over suprapubic/ STM/MWM at anterio rpelvic floor mm/ coccyx/ SIJ to promte nutation/ anterior tilt of pelvis                          PT Long Term Goals - 03/05/21 1417       PT LONG TERM GOAL #1   Title Pt will complete a chart detailing the method he uses for continence in relationship with activities across each day of the week in order to gather data for baseline    Time 2    Period Weeks    Status Achieved      PT LONG TERM GOAL #2   Title Pt will improve gait mechanics and posture  to increase walking endurance from 20 min to > 30 min in order to walk with less pain    Time 6    Period Weeks    Status Achieved      PT LONG TERM GOAL #3   Title Pt will demo less forward head posture from 30 cm from earlobe to wall to < 25 cm in order to achieve more upright posture to optimize IAP system for postural stability ( less LBP) and  urinary continence 12/19/20: 26 cm  03/05/21: 24 cm)    Time 8    Period Weeks    Status  Achieved      PT LONG TERM GOAL #4   Title Pt will demo IND with deep core coordination and pelvic floor coordination without compensatory patterns to help with ADLs    Time 4    Period Weeks    Status Achieved      PT LONG TERM GOAL #5   Title Pt will demo less L thoracic shift, less convex curve at T/L junction and more reciprocal gait pattern in order to regain structural midline and to progress to pelvic floor contractions with better outcomes    Time 10    Period Weeks    Status Achieved      Additional Long Term Goals   Additional Long Term Goals Yes      PT LONG TERM GOAL #6   Title Pt will report decreased pain by 50% with Bending over, getting out of bed, reaching up overhead activities in order to perform his hobbies and household chores.    Time 8    Period Weeks    Status Achieved      PT LONG TERM GOAL #7   Title Pt will demo proper deep core coordination, pelvic floor contractions 3 sec, 3 reps in order to to minimize leakage while gardening    Baseline chest breathing ( limited anterior diaphragmatic breathing)    Time 8    Period Weeks    Status Achieved      PT LONG TERM GOAL #8   Title Pt will report getting the sensation of fullness of bladder and being able to empty completely across 2 weeks    Time 10    Period Weeks    Status New    Target Date 05/14/21                   Plan - 03/26/21 1607     Clinical Impression Statement Pt required more manual Tx to promote sensation at low suprapubic and posterior pelvic floor areas, promote mobility at anterior pelvic floor/ coccyx/ SIJ, propioception of anterior tilt of pelvis.Pt required excessive cues for anterior tilt of pelvis. Provided explanation of anterior tilt of pelvis for continence and regaining urinary sensation. Pt voiced understanding. Pt continues to benefit from skilled PT.    Personal Factors and  Comorbidities Comorbidity 3+    Comorbidities Lumbar fusion L1-3    Examination-Activity Limitations Toileting;Lift;Bed Mobility;Locomotion Level;Bend;Sit;Sleep;Continence;Squat;Stand;Transfers;Reach Overhead    Stability/Clinical Decision Making Evolving/Moderate complexity    Rehab Potential Good    PT Frequency 1x / week    PT Duration Other (comment)   10   PT Treatment/Interventions Moist Heat;Therapeutic activities;Therapeutic exercise;Patient/family education;Neuromuscular re-education;Stair training;Gait training;Manual techniques;Taping;Splinting;Dry needling;Balance training;ADLs/Self Care Home Management;Cryotherapy;Traction;Functional mobility training;Energy conservation;Joint Manipulations;Passive range of motion;Scar mobilization    Consulted and Agree with Plan of Care Patient;Family member/caregiver    Family Member Consulted wife             Patient will benefit from skilled therapeutic intervention in order to improve the following deficits and impairments:  Decreased activity tolerance, Decreased endurance, Decreased range of motion, Decreased strength, Decreased coordination, Decreased mobility, Decreased scar mobility, Abnormal gait, Increased fascial restricitons, Impaired sensation, Improper body mechanics, Pain, Hypermobility, Increased muscle spasms, Hypomobility, Difficulty walking, Decreased knowledge of precautions, Decreased balance, Postural dysfunction, Cardiopulmonary status limiting activity, Decreased safety awareness  Visit Diagnosis: Other idiopathic scoliosis, thoracolumbar region  Abnormal posture  Other abnormalities of gait and mobility  Other urinary incontinence  Chronic low back pain without sciatica, unspecified back pain  laterality  Sacrococcygeal disorders, not elsewhere classified     Problem List Patient Active Problem List   Diagnosis Date Noted   Unstable angina ([place])    Lumbar burst fracture ([place]) 05/04/2020   Hypogonadism  male 10/10/2019   Persistent atrial fibrillation ([place]) 01/12/2019   Acute low back pain 11/04/2017   Abnormal screening cardiac CT    Visit for monitoring Tikosyn therapy 09/09/2017   History of stroke 08/29/2016   Paroxysmal atrial fibrillation ([place]) 07/23/2016   Coronary artery disease 07/23/2016   Snores 06/27/2016   Cardiac pacemaker in situ 06/25/2016   Hemiparesis ([place]) 06/25/2016   Acute ischemic stroke ([place])    Acute left hemiparesis (HCC)    Hemisensory loss    Dysarthria    Stroke-like symptoms    Complete heart block (HCC)    Pain in thoracic spine    Orthostasis    Lethargy    Occlusion of vertebral artery    TIA (transient ischemic attack) 06/14/2016   Arthritis 02/06/2015   Esophageal reflux 02/06/2015   Arthritis urica 02/06/2015   Cannot sleep 02/06/2015   Arthritis, degenerative 02/06/2015   Adenocarcinoma, renal cell ([place]) 02/06/2015   Benign prostatic hyperplasia with lower urinary tract symptoms 11/21/2014   Personal history of other malignant neoplasm of kidney 11/21/2014   Palpitations 12/31/2013   Hyperlipidemia 11/02/2010   HYPERTENSION, BENIGN 04/16/2010    Jerl Mina, PT 03/27/2021, 12:55 PM  [place] MAIN [facility] SERVICES [address] [place], [place], [address] Phone: [phone]   Fax:  [phone]  Name: Thomas [name] MRN: [id number] Date of Birth: [birth year]

## 2021-03-27 NOTE — Telephone Encounter (Signed)
Requested medications are due for refill today.  yes  Requested medications are on the active medications list.  yes  Last refill. 09/12/2020  Future visit scheduled.   yes  Notes to clinic.  Failed protocol d/t expired labs.

## 2021-04-03 ENCOUNTER — Other Ambulatory Visit: Payer: Self-pay

## 2021-04-03 ENCOUNTER — Ambulatory Visit: Payer: Medicare Other | Admitting: Physical Therapy

## 2021-04-03 DIAGNOSIS — R2689 Other abnormalities of gait and mobility: Secondary | ICD-10-CM | POA: Diagnosis not present

## 2021-04-03 DIAGNOSIS — M533 Sacrococcygeal disorders, not elsewhere classified: Secondary | ICD-10-CM | POA: Diagnosis not present

## 2021-04-03 DIAGNOSIS — M545 Low back pain, unspecified: Secondary | ICD-10-CM

## 2021-04-03 DIAGNOSIS — G8929 Other chronic pain: Secondary | ICD-10-CM | POA: Diagnosis not present

## 2021-04-03 DIAGNOSIS — M4125 Other idiopathic scoliosis, thoracolumbar region: Secondary | ICD-10-CM | POA: Diagnosis not present

## 2021-04-03 DIAGNOSIS — R293 Abnormal posture: Secondary | ICD-10-CM

## 2021-04-03 DIAGNOSIS — N39498 Other specified urinary incontinence: Secondary | ICD-10-CM

## 2021-04-03 NOTE — Patient Instructions (Addendum)
   Instead of straining upper stomach muscles with urination and bowel movemnt: Use exhalation and hug your Transverse Abdominal muscle in to expand pelvic floor   __  1) Hip flexor stretch with R foot back, R arm up on wall  10 reps   2) GLut strengthening:  Lean over arms of a chair,  Elbow by side Kick leg up parallel to spine   30 reps

## 2021-04-04 NOTE — Therapy (Signed)
[place] MAIN [facility] SERVICES [address] [place], [place], [address] Phone: [phone]   Fax:  [phone]  Physical Therapy Treatment  Patient Details  Name: Thomas [name] MRN: [id number] Date of Birth: 1933/01/12 Referring Provider (PT): Dawley and Erlene Quan   Encounter Date: 04/03/2021   PT End of Session - 04/04/21 1136     Visit Number 16    Date for PT Re-Evaluation 05/14/21   09/18/20, PN 02/05/21   PT Start Time 1600    PT Stop Time 1710    PT Time Calculation (min) 70 min    Activity Tolerance Patient tolerated treatment well;No increased pain    Behavior During Therapy WFL for tasks assessed/performed             Past Medical History:  Diagnosis Date   Aortic valve disorders    Arthritis    Atrial flutter ([place]) 06/18/2016   "AF or AFl; not sure which" (06/23/2016)   Basal cell carcinoma    "face, nose left shoulder, left arm" (06/19/2016)   Basal cell carcinoma 09/13/2020   right temple   BBB (bundle branch block)    hx right   Chronic back pain    "neck, thoracic, lower back" (06/19/2016)   Complete heart block ([place]) 06/2016   Dyspnea    GERD (gastroesophageal reflux disease)    Gout    Heart block    "I've had type I, II [place] before now" (06/19/2016)   History of gout    History of hiatal hernia    "self dx'd" (06/19/2016)   Hyperlipidemia    Hypertension    Lyme disease    "dx'd by me 2003; cx's showed dx 08/2015"   Migraine    "3-4/year" (06/19/2016)   Presence of permanent cardiac pacemaker 06/19/2016   PVC's (premature ventricular contractions)    Renal cancer, left ([place]) 2006   S/P cryotherapy   Spinal stenosis    "cervical, 1 thoracic, lumbar" (06/19/2016)   Squamous carcinoma    "face, nose left shoulder, left arm" (06/19/2016)   Stroke ([place])    TIA (transient ischemic attack) 06/14/2016   "I'm not sure that's what it was" (06/25/2016)   Visit for monitoring Tikosyn therapy 09/09/2017    Past  Surgical History:  Procedure Laterality Date   ANKLE FRACTURE SURGERY Right 1967   BACK SURGERY  05/07/2020   BASAL CELL CARCINOMA EXCISION     "face, nose left shoulder, left arm"   BIOPSY PROSTATE  2001 & 2003   CARDIAC CATHETERIZATION  1990's   CARDIOVERSION N/A 09/11/2017   Procedure: CARDIOVERSION;  Surgeon: Lelon Perla, MD;  Location: [facility];  Service: Cardiovascular;  Laterality: N/A;   FRACTURE SURGERY     HOLEP-LASER ENUCLEATION OF THE PROSTATE WITH MORCELLATION N/A 07/10/2020   Procedure: HOLEP-LASER ENUCLEATION OF THE PROSTATE WITH MORCELLATION;  Surgeon: Hollice Espy, MD;  Location: [facility];  Service: Urology;  Laterality: N/A;   INGUINAL HERNIA REPAIR Left 2012   INSERT / REPLACE / REMOVE PACEMAKER  06/19/2016   LAPAROSCOPIC ABLATION RENAL MASS     LEFT HEART CATH AND CORONARY ANGIOGRAPHY Left 10/23/2017   Procedure: LEFT HEART CATH AND CORONARY ANGIOGRAPHY;  Surgeon: Wellington Hampshire, MD;  Location: [place] [facility];  Service: Cardiovascular;  Laterality: Left;   LEFT HEART CATH AND CORONARY ANGIOGRAPHY Left 02/26/2021   Procedure: LEFT HEART CATH AND CORONARY ANGIOGRAPHY;  Surgeon: Wellington Hampshire, MD;  Location: [place] [facility];  Service: Cardiovascular;  Laterality: Left;   PACEMAKER IMPLANT N/A 06/19/2016   Procedure: Pacemaker Implant;  Surgeon: Deboraha Sprang, MD;  Location: [place] [facility];  Service: Cardiovascular;  Laterality: N/A;   pacemasker     PROSTATE SURGERY     SQUAMOUS CELL CARCINOMA EXCISION     "face, nose left shoulder, left arm"   TONSILLECTOMY AND ADENOIDECTOMY      There were no vitals filed for this visit.   Subjective Assessment - 04/03/21 1608     Subjective For a long time, he was acutely aware of feeling numb in the low ab/ pelvic area. Lately, he has not noticed the numbness as much. Pt is beginning to tell when his bladder is full.  Pt has weak stream and trying not to strain as much. Pt is straining with bowel  movements.                [facility] PT Assessment - 04/04/21 1137       Sensation   Additional Comments regained sensation at low suprapubic T11-12, perineum, no distinction on R gluteal line for soft/dull sensation                        Pelvic Floor Special Questions - 04/04/21 1217     External Palpation R pelvic floor tightness at obt int/ ischial rami attachments, sacrum lacking nutation, L SIJ at base/ coccyx lacking extensions               [facility] Adult PT Treatment/Exercise - 04/04/21 1220       Therapeutic Activites    Other Therapeutic Activities instruction and reason for use of squatty potty      Neuro Re-ed    Neuro Re-ed Details  cued for hip flexor stretches and strengthening gluts, cued for TrA to expand pelvic floor as alternative to straining with urination and bowel movements      Manual Therapy   Manual therapy comments Grade II-III AP mob  to promote hip ext L, superior glide lateral borders of coccyx to ext coccyx , STM/MWM at R ischial rami attachments and obt int    K-tape at sacrum to promote nutation/ ext of coccyx                          PT Long Term Goals - 03/05/21 1417       PT LONG TERM GOAL #1   Title Pt will complete a chart detailing the method he uses for continence in relationship with activities across each day of the week in order to gather data for baseline    Time 2    Period Weeks    Status Achieved      PT LONG TERM GOAL #2   Title Pt will improve gait mechanics and posture  to increase walking endurance from 20 min to > 30 min in order to walk with less pain    Time 6    Period Weeks    Status Achieved      PT LONG TERM GOAL #3   Title Pt will demo less forward head posture from 30 cm from earlobe to wall to < 25 cm in order to achieve more upright posture to optimize IAP system for postural stability ( less LBP) and urinary continence 12/19/20: 26 cm  03/05/21: 24 cm)    Time 8    Period  Weeks  Status Achieved      PT LONG TERM GOAL #4   Title Pt will demo IND with deep core coordination and pelvic floor coordination without compensatory patterns to help with ADLs    Time 4    Period Weeks    Status Achieved      PT LONG TERM GOAL #5   Title Pt will demo less L thoracic shift, less convex curve at T/L junction and more reciprocal gait pattern in order to regain structural midline and to progress to pelvic floor contractions with better outcomes    Time 10    Period Weeks    Status Achieved      Additional Long Term Goals   Additional Long Term Goals Yes      PT LONG TERM GOAL #6   Title Pt will report decreased pain by 50% with Bending over, getting out of bed, reaching up overhead activities in order to perform his hobbies and household chores.    Time 8    Period Weeks    Status Achieved      PT LONG TERM GOAL #7   Title Pt will demo proper deep core coordination, pelvic floor contractions 3 sec, 3 reps in order to to minimize leakage while gardening    Baseline chest breathing ( limited anterior diaphragmatic breathing)    Time 8    Period Weeks    Status Achieved      PT LONG TERM GOAL #8   Title Pt will report getting the sensation of fullness of bladder and being able to empty completely across 2 weeks    Time 10    Period Weeks    Status New    Target Date 05/14/21                   Plan - 04/04/21 1136     Clinical Impression Statement Patient is regaining more sensation at the low abdominal and perineum area.  Patient reports he is getting more sensation of the urge to urinate.  Pt continues to require more manual therapy to promote more hip extension and anterior tilt of the pelvis and posterior right pelvic floor mobility to provide for more neuro mobility.    Patient tolerated manual therapy without complaints today.  Patient demonstrated more mobility of the posterior right pelvic floor muscles and proper technique for new glutes  strengthening exercises.    Patient was educated on proper toileting technique with bowel movements with use of a stool or squatty potty under his feet in order to promote more anterior tilt of pelvis.  Patient was also educated on proper breathing technique to minimize straining downward at the pelvic floor and outward at the abdominal muscles during urination and bowel movements. Reemphasized importance of correcting sacral sitting to promote more extension of coccyx and anterior tilt of pelvis for optimal continence and bowel movement.   Continue to apply manual Tx / neurodynamic mobility to promote more sensation at posterior glut/ sacral area. Low abdominal area has regained sensation and pt has no difficulty distinguishing between dull/soft and distinguishing pressure bilaterally.   Pt continues to benefit from skilled PT.    Personal Factors and Comorbidities Comorbidity 3+    Comorbidities Lumbar fusion L1-3    Examination-Activity Limitations Toileting;Lift;Bed Mobility;Locomotion Level;Bend;Sit;Sleep;Continence;Squat;Stand;Transfers;Reach Overhead    Stability/Clinical Decision Making Evolving/Moderate complexity    Rehab Potential Good    PT Frequency 1x / week    PT Duration Other (comment)   10  PT Treatment/Interventions Moist Heat;Therapeutic activities;Therapeutic exercise;Patient/family education;Neuromuscular re-education;Stair training;Gait training;Manual techniques;Taping;Splinting;Dry needling;Balance training;ADLs/Self Care Home Management;Cryotherapy;Traction;Functional mobility training;Energy conservation;Joint Manipulations;Passive range of motion;Scar mobilization    Consulted and Agree with Plan of Care Patient;Family member/caregiver    Family Member Consulted wife             Patient will benefit from skilled therapeutic intervention in order to improve the following deficits and impairments:  Decreased activity tolerance, Decreased endurance, Decreased range  of motion, Decreased strength, Decreased coordination, Decreased mobility, Decreased scar mobility, Abnormal gait, Increased fascial restricitons, Impaired sensation, Improper body mechanics, Pain, Hypermobility, Increased muscle spasms, Hypomobility, Difficulty walking, Decreased knowledge of precautions, Decreased balance, Postural dysfunction, Cardiopulmonary status limiting activity, Decreased safety awareness  Visit Diagnosis: Other idiopathic scoliosis, thoracolumbar region  Abnormal posture  Other abnormalities of gait and mobility  Other urinary incontinence  Chronic low back pain without sciatica, unspecified back pain laterality  Sacrococcygeal disorders, not elsewhere classified     Problem List Patient Active Problem List   Diagnosis Date Noted   Unstable angina ([place])    Lumbar burst fracture ([place]) 05/04/2020   Hypogonadism male 10/10/2019   Persistent atrial fibrillation ([place]) 01/12/2019   Acute low back pain 11/04/2017   Abnormal screening cardiac CT    Visit for monitoring Tikosyn therapy 09/09/2017   History of stroke 08/29/2016   Paroxysmal atrial fibrillation ([place]) 07/23/2016   Coronary artery disease 07/23/2016   Snores 06/27/2016   Cardiac pacemaker in situ 06/25/2016   Hemiparesis ([place]) 06/25/2016   Acute ischemic stroke ([facility])    Acute left hemiparesis (H[facility])    Hemisensory loss    Dysarthria    Stroke-like symptoms    Complete heart block ([facility])    Pain in thoracic spine    Orthostasis    Lethargy    Occlusion of vertebral artery    TIA (transient ischemic attack) 06/14/2016   Arthritis 02/06/2015   Esophageal reflux 02/06/2015   Arthritis urica 02/06/2015   Cannot sleep 02/06/2015   Arthritis, degenerative 02/06/2015   Adenocarcinoma, renal cell ([place]) 02/06/2015   Benign prostatic hyperplasia with lower urinary tract symptoms 11/21/2014   Personal history of other malignant neoplasm of kidney 11/21/2014   Palpitations 12/31/2013    Hyperlipidemia 11/02/2010   HYPERTENSION, BENIGN 04/16/2010    Jerl Mina, PT 04/04/2021, 12:24 PM  [place] [facility] MAIN [facility] SERVICES [address] [place], [place], [address] Phone: [phone]   Fax:  [phone]  Name: Thomas [name] MRN: [id number] Date of Birth: 25-Aug-1932

## 2021-04-09 ENCOUNTER — Ambulatory Visit: Payer: Self-pay

## 2021-04-09 ENCOUNTER — Telehealth: Payer: Self-pay | Admitting: Family Medicine

## 2021-04-09 ENCOUNTER — Encounter: Payer: Self-pay | Admitting: Family Medicine

## 2021-04-09 MED ORDER — VALACYCLOVIR HCL 1 G PO TABS: 1000.0000 mg | ORAL_TABLET | Freq: Three times a day (TID) | ORAL | 0 refills | Status: AC

## 2021-04-09 NOTE — Telephone Encounter (Signed)
Noted.  See other phone note.

## 2021-04-09 NOTE — Telephone Encounter (Signed)
Medication Refill - Medication: Acyclovir  Has the patient contacted their pharmacy? No. (Agent: If no, request that the patient contact the pharmacy for the refill. If patient does not wish to contact the pharmacy document the reason why and proceed with request.) Patient self diagnosed shingles and states that he always calls in and receives the medication from PCP.Patient states that this time sensitive and is requesting to have it sent over today.   Preferred Pharmacy (with phone number or street name):  [facility], [place]  Phone:  [phone] Fax:  [phone]    Has the patient been seen for an appointment in the last year OR does the patient have an upcoming appointment? Yes.    Agent: Please be advised that RX refills may take up to 3 business days. We ask that you follow-up with your pharmacy.

## 2021-04-09 NOTE — Telephone Encounter (Signed)
Ok to send in Valtrex 1g TID x7 day (#21 r0). Let patient know. If not improving, should be evaluated

## 2021-04-09 NOTE — Telephone Encounter (Signed)
Pt stated he knows the bone density test has been ordered, but stated no one has called him to schedule.  Pt self dx shingles now. Pt stated he would call but he doesn't know the number.

## 2021-04-09 NOTE — Telephone Encounter (Signed)
Patient will be triaged for the shingles, see Nurse Triage encounter.

## 2021-04-09 NOTE — Telephone Encounter (Signed)
Patient advised as below.  

## 2021-04-09 NOTE — Telephone Encounter (Signed)
Pt c/o red rash with blisters at the sacral area of his back. Onset 2 days ago. Pt stated he has had multiple cases of shingles in the past and he just calls the office for a prescription of acyclovir. Pt c/o mild pain but moderate to severe itching that  wakes him. Pt requesting acyclovir to be called in ASAP to [facility]. Pt has called twice for this and needing the medication.  Routing high priority.   Reason for Disposition  [1] Shingles rash (matches SYMPTOMS) AND [2] onset < 72 hours ago (3 days)  Answer Assessment - Initial Assessment Questions 1. APPEARANCE of RASH: "Describe the rash."      Red with blisters 2. LOCATION: "Where is the rash located?"      Sacral area blisters 3. ONSET: "When did the rash start?"      2 days 4. ITCHING: "Does the rash itch?" If Yes, ask: "How bad is the itch?"  (Scale 1-10; or mild, moderate, severe)     Yes-moderate can be severe and wakes him up 5. PAIN: "Does the rash hurt?" If Yes, ask: "How bad is the pain?"  (Scale 0-10; or none, mild, moderate, severe)    - NONE (0): no pain    - MILD (1-3): doesn't interfere with normal activities     - MODERATE (4-7): interferes with normal activities or awakens from sleep     - SEVERE (8-10): excruciating pain, unable to do any normal activities     mild 6. OTHER SYMPTOMS: "Do you have any other symptoms?" (e.g., fever)     no 7. PREGNANCY: "Is there any chance you are pregnant?" "When was your last menstrual period?"     N/a  Protocols used: Shingles ([place])-A-AH

## 2021-04-17 ENCOUNTER — Other Ambulatory Visit: Payer: Self-pay

## 2021-04-17 ENCOUNTER — Ambulatory Visit: Payer: Medicare Other | Attending: Urology | Admitting: Physical Therapy

## 2021-04-17 DIAGNOSIS — M533 Sacrococcygeal disorders, not elsewhere classified: Secondary | ICD-10-CM | POA: Diagnosis not present

## 2021-04-17 DIAGNOSIS — R293 Abnormal posture: Secondary | ICD-10-CM | POA: Insufficient documentation

## 2021-04-17 DIAGNOSIS — M4125 Other idiopathic scoliosis, thoracolumbar region: Secondary | ICD-10-CM | POA: Insufficient documentation

## 2021-04-17 DIAGNOSIS — G8929 Other chronic pain: Secondary | ICD-10-CM | POA: Diagnosis not present

## 2021-04-17 DIAGNOSIS — R2689 Other abnormalities of gait and mobility: Secondary | ICD-10-CM | POA: Diagnosis not present

## 2021-04-17 DIAGNOSIS — N39498 Other specified urinary incontinence: Secondary | ICD-10-CM | POA: Diagnosis present

## 2021-04-17 DIAGNOSIS — M545 Low back pain, unspecified: Secondary | ICD-10-CM | POA: Insufficient documentation

## 2021-04-17 NOTE — Patient Instructions (Signed)
"  Drawing a sword" " pulling a lawnmover"   Band under opp  thigh  R hand holds end of the band that wraps the outside of L thigh Keep thigh aligned with toes and not let them move inward,  Keep elbow by your ribs  10 x 2 reps   MAINTAIN pressure at ischial tuberosity with folded towel under tailbone for anterior tilt of pelvis  __   Less straining with bowel movements and urination --> maintain anterior tilt

## 2021-04-17 NOTE — Therapy (Addendum)
[place] MAIN [facility] SERVICES [address] [place], [place], [address] Phone: [phone]   Fax:  [phone]  Physical Therapy Treatment  Patient Details  Name: Thomas [name] MRN: [id number] Date of Birth: [birth year] Referring Provider (PT): Dawley and Erlene Quan   Encounter Date: 04/17/2021   PT End of Session - 04/17/21 1735     Visit Number 17    Date for PT Re-Evaluation 05/14/21   09/18/20, PN 02/05/21   PT Start Time 1510    PT Stop Time 1600    PT Time Calculation (min) 50 min    Activity Tolerance Patient tolerated treatment well;No increased pain    Behavior During Therapy WFL for tasks assessed/performed             Past Medical History:  Diagnosis Date   Aortic valve disorders    Arthritis    Atrial flutter ([place]) 06/18/2016   "AF or AFl; not sure which" (06/23/2016)   Basal cell carcinoma    "face, nose left shoulder, left arm" (06/19/2016)   Basal cell carcinoma 09/13/2020   right temple   BBB (bundle branch block)    hx right   Chronic back pain    "neck, thoracic, lower back" (06/19/2016)   Complete heart block ([place]) 06/2016   Dyspnea    GERD (gastroesophageal reflux disease)    Gout    Heart block    "I've had type I, II Wenke before now" (06/19/2016)   History of gout    History of hiatal hernia    "self dx'd" (06/19/2016)   Hyperlipidemia    Hypertension    Lyme disease    "dx'd by me 2003; cx's showed dx 08/2015"   Migraine    "3-4/year" (06/19/2016)   Presence of permanent cardiac pacemaker 06/19/2016   PVC's (premature ventricular contractions)    Renal cancer, left ([place]) 2006   S/P cryotherapy   Spinal stenosis    "cervical, 1 thoracic, lumbar" (06/19/2016)   Squamous carcinoma    "face, nose left shoulder, left arm" (06/19/2016)   Stroke ([place])    TIA (transient ischemic attack) 06/14/2016   "I'm not sure that's what it was" (06/25/2016)   Visit for monitoring Tikosyn therapy 09/09/2017    Past  Surgical History:  Procedure Laterality Date   ANKLE FRACTURE SURGERY Right 1967   BACK SURGERY  05/07/2020   BASAL CELL CARCINOMA EXCISION     "face, nose left shoulder, left arm"   BIOPSY PROSTATE  2001 & 2003   CARDIAC CATHETERIZATION  1990's   CARDIOVERSION N/A 09/11/2017   Procedure: CARDIOVERSION;  Surgeon: Lelon Perla, MD;  Location: [place] [facility];  Service: Cardiovascular;  Laterality: N/A;   FRACTURE SURGERY     HOLEP-LASER ENUCLEATION OF THE PROSTATE WITH MORCELLATION N/A 07/10/2020   Procedure: HOLEP-LASER ENUCLEATION OF THE PROSTATE WITH MORCELLATION;  Surgeon: Hollice Espy, MD;  Location: [facility];  Service: Urology;  Laterality: N/A;   INGUINAL HERNIA REPAIR Left 2012   INSERT / REPLACE / REMOVE PACEMAKER  06/19/2016   LAPAROSCOPIC ABLATION RENAL MASS     LEFT HEART CATH AND CORONARY ANGIOGRAPHY Left 10/23/2017   Procedure: LEFT HEART CATH AND CORONARY ANGIOGRAPHY;  Surgeon: Wellington Hampshire, MD;  Location: [place] [facility];  Service: Cardiovascular;  Laterality: Left;   LEFT HEART CATH AND CORONARY ANGIOGRAPHY Left 02/26/2021   Procedure: LEFT HEART CATH AND CORONARY ANGIOGRAPHY;  Surgeon: Wellington Hampshire, MD;  Location: [place] [facility];  Service: Cardiovascular;  Laterality: Left;   PACEMAKER IMPLANT N/A 06/19/2016   Procedure: Pacemaker Implant;  Surgeon: Deboraha Sprang, MD;  Location: [place] [facility];  Service: Cardiovascular;  Laterality: N/A;   pacemasker     PROSTATE SURGERY     SQUAMOUS CELL CARCINOMA EXCISION     "face, nose left shoulder, left arm"   TONSILLECTOMY AND ADENOIDECTOMY      There were no vitals filed for this visit.   Subjective Assessment - 04/17/21 1514     Subjective Pt reported improved 20% with increased sensation of bladder fullness.                [facility] PT Assessment - 04/17/21 1846       Observation/Other Assessments   Observations thoracic kyphosis with shortened flank space , posterior pelvic tilt                         Pelvic Floor Special Questions - 04/17/21 1750     Pelvic Floor Internal Exam pt consented verball without contraindications    Exam Type Rectal    Palpation no tightness, tendency to be in posterior tilt,               [facility] Adult PT Treatment/Exercise - 04/17/21 1740       Therapeutic Activites    Other Therapeutic Activities provided explanation to anatomy/ physiology for pudendal nn and rationale to numbness near rectal area      Neuro Re-ed    Neuro Re-ed Details  cued for thoracic ext in seated position with PNF to promote lengthening of flank and more anterior tilt of pelvis with length  ening of pelvic floor,      Manual Therapy   Manual therapy comments therapist digital biofeedback tactile cue rectal assessment , cued for lengthening                          PT Long Term Goals - 03/05/21 1417       PT LONG TERM GOAL #1   Title Pt will complete a chart detailing the method he uses for continence in relationship with activities across each day of the week in order to gather data for baseline    Time 2    Period Weeks    Status Achieved      PT LONG TERM GOAL #2   Title Pt will improve gait mechanics and posture  to increase walking endurance from 20 min to > 30 min in order to walk with less pain    Time 6    Period Weeks    Status Achieved      PT LONG TERM GOAL #3   Title Pt will demo less forward head posture from 30 cm from earlobe to wall to < 25 cm in order to achieve more upright posture to optimize IAP system for postural stability ( less LBP) and urinary continence 12/19/20: 26 cm  03/05/21: 24 cm)    Time 8    Period Weeks    Status Achieved      PT LONG TERM GOAL #4   Title Pt will demo IND with deep core coordination and pelvic floor coordination without compensatory patterns to help with ADLs    Time 4    Period Weeks    Status Achieved      PT LONG TERM GOAL #5   Title Pt will demo less  L  thoracic shift, less convex curve at T/L junction and more reciprocal gait pattern in order to regain structural midline and to progress to pelvic floor contractions with better outcomes    Time 10    Period Weeks    Status Achieved      Additional Long Term Goals   Additional Long Term Goals Yes      PT LONG TERM GOAL #6   Title Pt will report decreased pain by 50% with Bending over, getting out of bed, reaching up overhead activities in order to perform his hobbies and household chores.    Time 8    Period Weeks    Status Achieved      PT LONG TERM GOAL #7   Title Pt will demo proper deep core coordination, pelvic floor contractions 3 sec, 3 reps in order to to minimize leakage while gardening    Baseline chest breathing ( limited anterior diaphragmatic breathing)    Time 8    Period Weeks    Status Achieved      PT LONG TERM GOAL #8   Title Pt will report getting the sensation of fullness of bladder and being able to empty completely across 2 weeks    Time 10    Period Weeks    Status New    Target Date 05/14/21                   Plan - 04/17/21 1740     Clinical Impression Statement Pt demo'd coordination of rectal mm and pt had awareness of rectal mm control but pt required excessive cues for anterior tilt of pelvis to promote more lengthening of rectal mm. Added seated propioception training w/ folded towel under posterior pelvic floor to enforce anterior tilt of pelvic floor and added thoracic extension strengthening. Pt continues to benefit from skilled PT              Personal Factors and Comorbidities Comorbidity 3+    Comorbidities Lumbar fusion L1-3    Examination-Activity Limitations Toileting;Lift;Bed Mobility;Locomotion Level;Bend;Sit;Sleep;Continence;Squat;Stand;Transfers;Reach Overhead    Stability/Clinical Decision Making Evolving/Moderate complexity    Rehab Potential Good    PT Frequency 1x / week    PT Duration Other (comment)   10   PT  Treatment/Interventions Moist Heat;Therapeutic activities;Therapeutic exercise;Patient/family education;Neuromuscular re-education;Stair training;Gait training;Manual techniques;Taping;Splinting;Dry needling;Balance training;ADLs/Self Care Home Management;Cryotherapy;Traction;Functional mobility training;Energy conservation;Joint Manipulations;Passive range of motion;Scar mobilization    Consulted and Agree with Plan of Care Patient;Family member/caregiver    Family Member Consulted wife             Patient will benefit from skilled therapeutic intervention in order to improve the following deficits and impairments:  Decreased activity tolerance, Decreased endurance, Decreased range of motion, Decreased strength, Decreased coordination, Decreased mobility, Decreased scar mobility, Abnormal gait, Increased fascial restricitons, Impaired sensation, Improper body mechanics, Pain, Hypermobility, Increased muscle spasms, Hypomobility, Difficulty walking, Decreased knowledge of precautions, Decreased balance, Postural dysfunction, Cardiopulmonary status limiting activity, Decreased safety awareness  Visit Diagnosis: Other idiopathic scoliosis, thoracolumbar region  Abnormal posture  Other abnormalities of gait and mobility  Other urinary incontinence  Chronic low back pain without sciatica, unspecified back pain laterality  Sacrococcygeal disorders, not elsewhere classified     Problem List Patient Active Problem List   Diagnosis Date Noted   Unstable angina ([place])    Lumbar burst fracture ([place]) 05/04/2020   Hypogonadism male 10/10/2019   Persistent atrial fibrillation ([place]) 01/12/2019   Acute low back pain 11/04/2017  Abnormal screening cardiac CT    Visit for monitoring Tikosyn therapy 09/09/2017   History of stroke 08/29/2016   Paroxysmal atrial fibrillation ([place]) 07/23/2016   Coronary artery disease 07/23/2016   Snores 06/27/2016   Cardiac pacemaker in situ 06/25/2016    Hemiparesis ([place]) 06/25/2016   Acute ischemic stroke ([place])    Acute left hemiparesis ([facility])    Hemisensory loss    Dysarthria    Stroke-like symptoms    Complete heart block ([facility])    Pain in thoracic spine    Orthostasis    Lethargy    Occlusion of vertebral artery    TIA (transient ischemic attack) 06/14/2016   Arthritis 02/06/2015   Esophageal reflux 02/06/2015   Arthritis urica 02/06/2015   Cannot sleep 02/06/2015   Arthritis, degenerative 02/06/2015   Adenocarcinoma, renal cell ([place]) 02/06/2015   Benign prostatic hyperplasia with lower urinary tract symptoms 11/21/2014   Personal history of other malignant neoplasm of kidney 11/21/2014   Palpitations 12/31/2013   Hyperlipidemia 11/02/2010   HYPERTENSION, BENIGN 04/16/2010    Jerl Mina, PT 04/17/2021, 6:52 PM  [place] MAIN [facility] SERVICES [address] [place], [place], [address] Phone: [phone]   Fax:  [phone]  Name: AGNES PROBERT MRN: [id number] Date of Birth: 07-06-1932

## 2021-04-23 ENCOUNTER — Ambulatory Visit: Payer: Medicare Other | Admitting: Physical Therapy

## 2021-04-23 ENCOUNTER — Other Ambulatory Visit: Payer: Self-pay

## 2021-04-23 DIAGNOSIS — M533 Sacrococcygeal disorders, not elsewhere classified: Secondary | ICD-10-CM

## 2021-04-23 DIAGNOSIS — G8929 Other chronic pain: Secondary | ICD-10-CM | POA: Diagnosis not present

## 2021-04-23 DIAGNOSIS — R293 Abnormal posture: Secondary | ICD-10-CM

## 2021-04-23 DIAGNOSIS — N39498 Other specified urinary incontinence: Secondary | ICD-10-CM

## 2021-04-23 DIAGNOSIS — M4125 Other idiopathic scoliosis, thoracolumbar region: Secondary | ICD-10-CM

## 2021-04-23 DIAGNOSIS — R2689 Other abnormalities of gait and mobility: Secondary | ICD-10-CM

## 2021-04-23 DIAGNOSIS — M545 Low back pain, unspecified: Secondary | ICD-10-CM | POA: Diagnosis not present

## 2021-04-23 NOTE — Therapy (Signed)
[place] MAIN [facility] SERVICES [address] [place], [place], [address] Phone: [phone]   Fax:  [phone]  Physical Therapy Treatment  Patient Details  Name: Thomas [name] MRN: [id number] Date of Birth: 1932/07/28 Referring Provider (PT): Dawley and Erlene Quan   Encounter Date: 04/23/2021   PT End of Session - 04/23/21 1449     Visit Number 18    Date for PT Re-Evaluation 05/14/21   09/18/20, PN 02/05/21   PT Start Time 1030    PT Stop Time 1500    PT Time Calculation (min) 53 min    Activity Tolerance Patient tolerated treatment well;No increased pain    Behavior During Therapy WFL for tasks assessed/performed             Past Medical History:  Diagnosis Date   Aortic valve disorders    Arthritis    Atrial flutter ([place]) 06/18/2016   "AF or AFl; not sure which" (06/23/2016)   Basal cell carcinoma    "face, nose left shoulder, left arm" (06/19/2016)   Basal cell carcinoma 09/13/2020   right temple   BBB (bundle branch block)    hx right   Chronic back pain    "neck, thoracic, lower back" (06/19/2016)   Complete heart block ([place]) 06/2016   Dyspnea    GERD (gastroesophageal reflux disease)    Gout    Heart block    "I've had type I, II Wenke before now" (06/19/2016)   History of gout    History of hiatal hernia    "self dx'd" (06/19/2016)   Hyperlipidemia    Hypertension    Lyme disease    "dx'd by me 2003; cx's showed dx 08/2015"   Migraine    "3-4/year" (06/19/2016)   Presence of permanent cardiac pacemaker 06/19/2016   PVC's (premature ventricular contractions)    Renal cancer, left ([place]) 2006   S/P cryotherapy   Spinal stenosis    "cervical, 1 thoracic, lumbar" (06/19/2016)   Squamous carcinoma    "face, nose left shoulder, left arm" (06/19/2016)   Stroke ([place])    TIA (transient ischemic attack) 06/14/2016   "I'm not sure that's what it was" (06/25/2016)   Visit for monitoring Tikosyn therapy 09/09/2017    Past  Surgical History:  Procedure Laterality Date   ANKLE FRACTURE SURGERY Right 1967   BACK SURGERY  05/07/2020   BASAL CELL CARCINOMA EXCISION     "face, nose left shoulder, left arm"   BIOPSY PROSTATE  2001 & 2003   CARDIAC CATHETERIZATION  1990's   CARDIOVERSION N/A 09/11/2017   Procedure: CARDIOVERSION;  Surgeon: Lelon Perla, MD;  Location: [place] [facility];  Service: Cardiovascular;  Laterality: N/A;   FRACTURE SURGERY     HOLEP-LASER ENUCLEATION OF THE PROSTATE WITH MORCELLATION N/A 07/10/2020   Procedure: HOLEP-LASER ENUCLEATION OF THE PROSTATE WITH MORCELLATION;  Surgeon: Hollice Espy, MD;  Location: [facility];  Service: Urology;  Laterality: N/A;   INGUINAL HERNIA REPAIR Left 2012   INSERT / REPLACE / REMOVE PACEMAKER  06/19/2016   LAPAROSCOPIC ABLATION RENAL MASS     LEFT HEART CATH AND CORONARY ANGIOGRAPHY Left 10/23/2017   Procedure: LEFT HEART CATH AND CORONARY ANGIOGRAPHY;  Surgeon: Wellington Hampshire, MD;  Location: [place] [facility];  Service: Cardiovascular;  Laterality: Left;   LEFT HEART CATH AND CORONARY ANGIOGRAPHY Left 02/26/2021   Procedure: LEFT HEART CATH AND CORONARY ANGIOGRAPHY;  Surgeon: Wellington Hampshire, MD;  Location: [place] [facility];  Service: Cardiovascular;  Laterality: Left;   PACEMAKER IMPLANT N/A 06/19/2016   Procedure: Pacemaker Implant;  Surgeon: Deboraha Sprang, MD;  Location: [place] [facility];  Service: Cardiovascular;  Laterality: N/A;   pacemasker     PROSTATE SURGERY     SQUAMOUS CELL CARCINOMA EXCISION     "face, nose left shoulder, left arm"   TONSILLECTOMY AND ADENOIDECTOMY      There were no vitals filed for this visit.   Subjective Assessment - 04/23/21 1450     Subjective Pt reported he is not as aware of the numbness in the pelvic area as there was before. Pt has been catching himself not slouching in chairs.                [facility] PT Assessment - 04/23/21 1546       Observation/Other Assessments   Observations posterior  tilt / excessive lumbar lordosis in plantigrade plank, poor propicoeption of scapular retraction/ depression                           [facility] Adult PT Treatment/Exercise - 04/23/21 1455       Therapeutic Activites    Other Therapeutic Activities explained alignment, importance of avoiding posterior tilt of pelvis in HEP and prescription of thoracic spine for more extension of thoracic spine for bladder innervation and anterior tilt of pelvis allows for pudendal nn      Neuro Re-ed    Neuro Re-ed Details  cued for scapular retraction/ depression in plantigrade plank, tricep dips, opp scaption/ step back      Exercises   Other Exercises  see pt instructions                          PT Long Term Goals - 04/23/21 1547       PT LONG TERM GOAL #1   Title Pt will complete a chart detailing the method he uses for continence in relationship with activities across each day of the week in order to gather data for baseline    Time 2    Period Weeks    Status Achieved      PT LONG TERM GOAL #2   Title Pt will improve gait mechanics and posture  to increase walking endurance from 20 min to > 30 min in order to walk with less pain    Time 6    Period Weeks    Status Achieved      PT LONG TERM GOAL #3   Title Pt will demo less forward head posture from 30 cm from earlobe to wall to < 25 cm in order to achieve more upright posture to optimize IAP system for postural stability ( less LBP) and urinary continence 12/19/20: 26 cm  03/05/21: 24 cm)    Time 8    Period Weeks    Status Achieved      PT LONG TERM GOAL #4   Title Pt will demo IND with deep core coordination and pelvic floor coordination without compensatory patterns to help with ADLs    Time 4    Period Weeks    Status Achieved      PT LONG TERM GOAL #5   Title Pt will demo less L thoracic shift, less convex curve at T/L junction and more reciprocal gait pattern in order to regain structural midline  and to progress to pelvic floor contractions with better  outcomes    Time 10    Period Weeks    Status Achieved      PT LONG TERM GOAL #6   Title Pt will report decreased pain by 50% with Bending over, getting out of bed, reaching up overhead activities in order to perform his hobbies and household chores.    Time 8    Period Weeks    Status Achieved      PT LONG TERM GOAL #7   Title Pt will demo proper deep core coordination, pelvic floor contractions 3 sec, 3 reps in order to to minimize leakage while gardening    Baseline chest breathing ( limited anterior diaphragmatic breathing)    Time 8    Period Weeks    Status Achieved      PT LONG TERM GOAL #8   Title Pt will report getting the sensation of fullness of bladder and being able to empty completely across 2 weeks    Time 10    Period Weeks    Status On-going    Target Date 05/14/21                   Plan - 04/23/21 1449     Clinical Impression Statement Pt reported he is not as aware of the pelvic numbness compared to last session and continues to be aware of correcting his posture when he slumps.  This is an indication that promoting more anterior tilt of pelvis and the previous manual Tx is helping with pudendal nn mobility.  Pt required proprioception training to minimize increased lumbar lordosis/posterior tilt of pelvis in plantigrade plank. Prescribed HEP progressions to promote thoracic extension  to help improve anterior tilt of pelvis for optimal bladder position/ pudendal nn mobility to regain sensation in these areas and sensation for bladder   fullness. Pt continues to benefit from skilled PT. Plan to reassess pelvic floor sensation with dull/sharp distinction test.         Personal Factors and Comorbidities Comorbidity 3+    Comorbidities Lumbar fusion L1-3    Examination-Activity Limitations Toileting;Lift;Bed Mobility;Locomotion Level;Bend;Sit;Sleep;Continence;Squat;Stand;Transfers;Reach Overhead     Stability/Clinical Decision Making Evolving/Moderate complexity    Rehab Potential Good    PT Frequency 1x / week    PT Duration Other (comment)   10   PT Treatment/Interventions Moist Heat;Therapeutic activities;Therapeutic exercise;Patient/family education;Neuromuscular re-education;Stair training;Gait training;Manual techniques;Taping;Splinting;Dry needling;Balance training;ADLs/Self Care Home Management;Cryotherapy;Traction;Functional mobility training;Energy conservation;Joint Manipulations;Passive range of motion;Scar mobilization    Consulted and Agree with Plan of Care Patient;Family member/caregiver    Family Member Consulted wife             Patient will benefit from skilled therapeutic intervention in order to improve the following deficits and impairments:  Decreased activity tolerance, Decreased endurance, Decreased range of motion, Decreased strength, Decreased coordination, Decreased mobility, Decreased scar mobility, Abnormal gait, Increased fascial restricitons, Impaired sensation, Improper body mechanics, Pain, Hypermobility, Increased muscle spasms, Hypomobility, Difficulty walking, Decreased knowledge of precautions, Decreased balance, Postural dysfunction, Cardiopulmonary status limiting activity, Decreased safety awareness  Visit Diagnosis: Other idiopathic scoliosis, thoracolumbar region  Abnormal posture  Other abnormalities of gait and mobility  Other urinary incontinence  Chronic low back pain without sciatica, unspecified back pain laterality  Sacrococcygeal disorders, not elsewhere classified     Problem List Patient Active Problem List   Diagnosis Date Noted   Unstable angina ([place])    Lumbar burst fracture ([place]) 05/04/2020   Hypogonadism male 10/10/2019   Persistent atrial fibrillation ([place]) 01/12/2019  Acute low back pain 11/04/2017   Abnormal screening cardiac CT    Visit for monitoring Tikosyn therapy 09/09/2017   History of stroke 08/29/2016    Paroxysmal atrial fibrillation ([place]) 07/23/2016   Coronary artery disease 07/23/2016   Snores 06/27/2016   Cardiac pacemaker in situ 06/25/2016   Hemiparesis ([place]) 06/25/2016   Acute ischemic stroke ([place])    Acute left hemiparesis ([facility])    Hemisensory loss    Dysarthria    Stroke-like symptoms    Complete heart block ([facility])    Pain in thoracic spine    Orthostasis    Lethargy    Occlusion of vertebral artery    TIA (transient ischemic attack) 06/14/2016   Arthritis 02/06/2015   Esophageal reflux 02/06/2015   Arthritis urica 02/06/2015   Cannot sleep 02/06/2015   Arthritis, degenerative 02/06/2015   Adenocarcinoma, renal cell ([place]) 02/06/2015   Benign prostatic hyperplasia with lower urinary tract symptoms 11/21/2014   Personal history of other malignant neoplasm of kidney 11/21/2014   Palpitations 12/31/2013   Hyperlipidemia 11/02/2010   HYPERTENSION, BENIGN 04/16/2010    Jerl Mina, PT 04/23/2021, 3:47 PM  [place] MAIN [facility] SERVICES [address] [place], [place], [address] Phone: [phone]   Fax:  [phone]  Name: KHUSH PASION MRN: [id number] Date of Birth: 1932-07-23

## 2021-04-23 NOTE — Patient Instructions (Addendum)
Tricep dips, knees bent above ankle, 90 deg to floor Over an ottoman / couch  10 reps x 3   Lower buttocks just enough before the arms shake,  Push through 4 points in palm on the rise ( activates the arm pit muscles , exhale to rise '  __  One arm push seated  Elbow by ribs  20 reps each side   __  Plank 5 sec   at arm rest of chair pushed up against wall  NO tucked buttocks,  Squeeze shoulders down and back  Gaze at 45 deg angle at seat edge with back of seat   Rest with mini squat  With hands behind back, pull hands down, chift lifts  Not Makarios Wayne,find the position to not tuck buttucks  Balanced between ballmounds and heel   10 reps   ___  Standing modified swimmers  Shoulders down and back . Opp arm in half V position to leg that goes back, knee against chair  2 min

## 2021-05-01 ENCOUNTER — Ambulatory Visit: Payer: Medicare Other | Admitting: Physical Therapy

## 2021-05-01 ENCOUNTER — Ambulatory Visit (INDEPENDENT_AMBULATORY_CARE_PROVIDER_SITE_OTHER): Payer: Medicare Other

## 2021-05-01 DIAGNOSIS — I442 Atrioventricular block, complete: Secondary | ICD-10-CM | POA: Diagnosis not present

## 2021-05-01 LAB — CUP PACEART REMOTE DEVICE CHECK
Battery Remaining Longevity: 58 mo
Battery Voltage: 2.96 V
Brady Statistic AP VP Percent: 96.87 %
Brady Statistic AP VS Percent: 0 %
Brady Statistic AS VP Percent: 3.1 %
Brady Statistic AS VS Percent: 0.03 %
Brady Statistic RA Percent Paced: 96.87 %
Brady Statistic RV Percent Paced: 99.97 %
Date Time Interrogation Session: 20221227010100
Implantable Lead Implant Date: 20180214
Implantable Lead Implant Date: 20180214
Implantable Lead Location: 753859
Implantable Lead Location: 753860
Implantable Lead Model: 5076
Implantable Lead Model: 5076
Implantable Pulse Generator Implant Date: 20180214
Lead Channel Impedance Value: 304 Ohm
Lead Channel Impedance Value: 323 Ohm
Lead Channel Impedance Value: 380 Ohm
Lead Channel Impedance Value: 399 Ohm
Lead Channel Pacing Threshold Amplitude: 0.5 V
Lead Channel Pacing Threshold Amplitude: 0.75 V
Lead Channel Pacing Threshold Pulse Width: 0.4 ms
Lead Channel Pacing Threshold Pulse Width: 0.4 ms
Lead Channel Sensing Intrinsic Amplitude: 18.375 mV
Lead Channel Sensing Intrinsic Amplitude: 18.375 mV
Lead Channel Sensing Intrinsic Amplitude: 3 mV
Lead Channel Sensing Intrinsic Amplitude: 3 mV
Lead Channel Setting Pacing Amplitude: 2 V
Lead Channel Setting Pacing Amplitude: 2.5 V
Lead Channel Setting Pacing Pulse Width: 0.4 ms
Lead Channel Setting Sensing Sensitivity: 2 mV

## 2021-05-10 ENCOUNTER — Ambulatory Visit
Admission: RE | Admit: 2021-05-10 | Discharge: 2021-05-10 | Disposition: A | Discharge status: Home / Self Care | Payer: Medicare Other | Source: Ambulatory Visit | Attending: Family Medicine | Admitting: Family Medicine

## 2021-05-10 ENCOUNTER — Other Ambulatory Visit: Payer: Self-pay

## 2021-05-10 DIAGNOSIS — S32001D Stable burst fracture of unspecified lumbar vertebra, subsequent encounter for fracture with routine healing: Secondary | ICD-10-CM | POA: Insufficient documentation

## 2021-05-10 DIAGNOSIS — Z13820 Encounter for screening for osteoporosis: Secondary | ICD-10-CM | POA: Diagnosis not present

## 2021-05-10 DIAGNOSIS — Z79890 Hormone replacement therapy (postmenopausal): Secondary | ICD-10-CM | POA: Insufficient documentation

## 2021-05-10 DIAGNOSIS — Z09 Encounter for follow-up examination after completed treatment for conditions other than malignant neoplasm: Secondary | ICD-10-CM | POA: Diagnosis not present

## 2021-05-10 NOTE — Progress Notes (Signed)
Remote pacemaker transmission.   

## 2021-05-14 ENCOUNTER — Other Ambulatory Visit: Payer: Self-pay

## 2021-05-14 ENCOUNTER — Ambulatory Visit: Payer: Medicare Other | Attending: Urology | Admitting: Physical Therapy

## 2021-05-14 DIAGNOSIS — M4125 Other idiopathic scoliosis, thoracolumbar region: Secondary | ICD-10-CM | POA: Insufficient documentation

## 2021-05-14 DIAGNOSIS — M533 Sacrococcygeal disorders, not elsewhere classified: Secondary | ICD-10-CM | POA: Diagnosis not present

## 2021-05-14 DIAGNOSIS — N39498 Other specified urinary incontinence: Secondary | ICD-10-CM | POA: Diagnosis present

## 2021-05-14 DIAGNOSIS — R2689 Other abnormalities of gait and mobility: Secondary | ICD-10-CM | POA: Diagnosis not present

## 2021-05-14 DIAGNOSIS — M545 Low back pain, unspecified: Secondary | ICD-10-CM | POA: Insufficient documentation

## 2021-05-14 DIAGNOSIS — G8929 Other chronic pain: Secondary | ICD-10-CM | POA: Insufficient documentation

## 2021-05-14 DIAGNOSIS — R293 Abnormal posture: Secondary | ICD-10-CM | POA: Insufficient documentation

## 2021-05-14 NOTE — Patient Instructions (Signed)
Swimmers -- glut strengthening and anterior tilt of pelvis ( pillow under belly)  2 min __  To finish urination- no straining with upper     stomach  Exhale and use transverse abdominis to push down gentle

## 2021-05-14 NOTE — Therapy (Signed)
[place] MAIN [facility] SERVICES [address] [place], [place], [address] Phone: [phone]   Fax:  [phone]  Physical Therapy Treatment  Patient Details  Name: STANLY SI MRN: [id number] Date of Birth: 02/20/1933 Referring Provider (PT): Dawley and Erlene Quan   Encounter Date: 05/14/2021   PT End of Session - 05/14/21 1511     Visit Number 19    Date for PT Re-Evaluation 07/23/21   09/18/20, PN 02/05/21   PT Start Time 3419    PT Stop Time 1600    PT Time Calculation (min) 53 min    Activity Tolerance Patient tolerated treatment well;No increased pain    Behavior During Therapy WFL for tasks assessed/performed             Past Medical History:  Diagnosis Date   Aortic valve disorders    Arthritis    Atrial flutter ([place]) 06/18/2016   "AF or AFl; not sure which" (06/23/2016)   Basal cell carcinoma    "face, nose left shoulder, left arm" (06/19/2016)   Basal cell carcinoma 09/13/2020   right temple   BBB (bundle branch block)    hx right   Chronic back pain    "neck, thoracic, lower back" (06/19/2016)   Complete heart block ([place]) 06/2016   Dyspnea    GERD (gastroesophageal reflux disease)    Gout    Heart block    "I've had type I, II Wenke before now" (06/19/2016)   History of gout    History of hiatal hernia    "self dx'd" (06/19/2016)   Hyperlipidemia    Hypertension    Lyme disease    "dx'd by me 2003; cx's showed dx 08/2015"   Migraine    "3-4/year" (06/19/2016)   Presence of permanent cardiac pacemaker 06/19/2016   PVC's (premature ventricular contractions)    Renal cancer, left ([place]) 2006   S/P cryotherapy   Spinal stenosis    "cervical, 1 thoracic, lumbar" (06/19/2016)   Squamous carcinoma    "face, nose left shoulder, left arm" (06/19/2016)   Stroke ([place])    TIA (transient ischemic attack) 06/14/2016   "I'm not sure that's what it was" (06/25/2016)   Visit for monitoring Tikosyn therapy 09/09/2017    Past  Surgical History:  Procedure Laterality Date   ANKLE FRACTURE SURGERY Right 1967   BACK SURGERY  05/07/2020   BASAL CELL CARCINOMA EXCISION     "face, nose left shoulder, left arm"   BIOPSY PROSTATE  2001 & 2003   CARDIAC CATHETERIZATION  1990's   CARDIOVERSION N/A 09/11/2017   Procedure: CARDIOVERSION;  Surgeon: Lelon Perla, MD;  Location: [place] [facility];  Service: Cardiovascular;  Laterality: N/A;   FRACTURE SURGERY     HOLEP-LASER ENUCLEATION OF THE PROSTATE WITH MORCELLATION N/A 07/10/2020   Procedure: HOLEP-LASER ENUCLEATION OF THE PROSTATE WITH MORCELLATION;  Surgeon: Hollice Espy, MD;  Location: [facility];  Service: Urology;  Laterality: N/A;   INGUINAL HERNIA REPAIR Left 2012   INSERT / REPLACE / REMOVE PACEMAKER  06/19/2016   LAPAROSCOPIC ABLATION RENAL MASS     LEFT HEART CATH AND CORONARY ANGIOGRAPHY Left 10/23/2017   Procedure: LEFT HEART CATH AND CORONARY ANGIOGRAPHY;  Surgeon: Wellington Hampshire, MD;  Location: [place] [facility];  Service: Cardiovascular;  Laterality: Left;   LEFT HEART CATH AND CORONARY ANGIOGRAPHY Left 02/26/2021   Procedure: LEFT HEART CATH AND CORONARY ANGIOGRAPHY;  Surgeon: Wellington Hampshire, MD;  Location: [place] [facility];  Service: Cardiovascular;  Laterality: Left;   PACEMAKER IMPLANT N/A 06/19/2016   Procedure: Pacemaker Implant;  Surgeon: Deboraha Sprang, MD;  Location: [place] [facility];  Service: Cardiovascular;  Laterality: N/A;   pacemasker     PROSTATE SURGERY     SQUAMOUS CELL CARCINOMA EXCISION     "face, nose left shoulder, left arm"   TONSILLECTOMY AND ADENOIDECTOMY      There were no vitals filed for this visit.   Subjective Assessment - 05/14/21 1510     Subjective Pt reported numbness decreased from larger surface area to smaller area at the padding where he sits. The numbness is deep tissue. Pt begins to have sensation when bladder is full. At night, pt 's pad stays dry second half of the night. Pt notices less leakage for  the 1st hour. When he is lifting and staying activites, pt is leakage.                            Pelvic Floor Special Questions - 05/14/21 1828     Pelvic Floor Internal Exam pt consented verball without contraindications    Exam Type Rectal    Palpation tightness at L pelvic floor and anterior aspect. requried cues for optimal lengthening and coordination.               [place] Adult PT Treatment/Exercise - 05/14/21 1828       Therapeutic Activites    Other Therapeutic Activities reassessed goals, discussed progress and POC      Neuro Re-ed    Neuro Re-ed Details  cued for hip ext in prone and with anterior tilt pelvic girdle ,cued for TrA activation for completing urination/ lesser straining     Manual Therapy   Internal Pelvic Floor rectal: STM/MWM to promote pelvic floor mobility andtactile cues for coordination                          PT Long Term Goals - 05/14/21 1512       PT LONG TERM GOAL #1   Title Pt will complete a chart detailing the method he uses for continence in relationship with activities across each day of the week in order to gather data for baseline    Time 2    Period Weeks    Status Achieved      PT LONG TERM GOAL #2   Title Pt will improve gait mechanics and posture  to increase walking endurance from 20 min to > 30 min in order to walk with less pain    Time 6    Period Weeks    Status Achieved      PT LONG TERM GOAL #3   Title Pt will demo less forward head posture from 30 cm from earlobe to wall to < 25 cm in order to achieve more upright posture to optimize IAP system for postural stability ( less LBP) and urinary continence 12/19/20: 26 cm  03/05/21: 24 cm , 05/14/21: 25 cm)  )    Time 8    Period Weeks    Status Achieved      PT LONG TERM GOAL #4   Title Pt will demo IND with deep core coordination and pelvic floor coordination without compensatory patterns to help with ADLs    Time 4    Period Weeks     Status Achieved      PT  LONG TERM GOAL #5   Title Pt will demo less L thoracic shift, less convex curve at T/L junction and more reciprocal gait pattern in order to regain structural midline and to progress to pelvic floor contractions with better outcomes    Time 10    Period Weeks    Status Achieved      PT LONG TERM GOAL #6   Title Pt will report decreased pain by 50% with Bending over, getting out of bed, reaching up overhead activities in order to perform his hobbies and household chores.    Time 8    Period Weeks    Status Achieved      PT LONG TERM GOAL #7   Title Pt will demo proper deep core coordination, pelvic floor contractions 3 sec, 3 reps in order to to minimize leakage while gardening    Baseline chest breathing ( limited anterior diaphragmatic breathing)    Time 8    Period Weeks    Status Achieved      PT LONG TERM GOAL #8   Title Pt will report getting the sensation of fullness of bladder and being able to empty completely across 2 weeks (05/14/21: still straining)    Time 10    Period Weeks    Status Partially Met    Target Date 05/14/21                   Plan - 05/14/21 1511     Clinical Impression Statement Pt has achieved 7/8 goals with resolved LBP, improved posture, and gradually increasing pelvic floor mm strength. Pt reported numbness decreased from larger surface area to smaller area at the padding of buttocks  where he sits. Pt begins to have sensation when bladder is full. At night, pt  's pad stays dry second half of the night. Pt notices less leakage for the 1st hour. When he is lifting and staying activites, pt is leakage.        Pt continues to require skilled PT to further minimize posterior tilt, increase hip extension/ lumbar lordosis. These musculoskeletal changes will help with complete urination with less straining and to increase pelvic floor endurance with increased physical activities.  Personal Factors and Comorbidities Comorbidity 3+    Comorbidities Lumbar fusion L1-3    Examination-Activity Limitations Toileting;Lift;Bed Mobility;Locomotion Level;Bend;Sit;Sleep;Continence;Squat;Stand;Transfers;Reach Overhead    Stability/Clinical Decision Making Evolving/Moderate complexity    Rehab Potential Good    PT Frequency 1x / week    PT Duration Other (comment)   10   PT Treatment/Interventions Moist Heat;Therapeutic activities;Therapeutic exercise;Patient/family education;Neuromuscular re-education;Stair training;Gait training;Manual techniques;Taping;Splinting;Dry needling;Balance training;ADLs/Self Care Home Management;Cryotherapy;Traction;Functional mobility training;Energy conservation;Joint Manipulations;Passive range of motion;Scar mobilization    Consulted and Agree with Plan of Care Patient;Family member/caregiver    Family Member Consulted wife             Patient will benefit from skilled therapeutic intervention in order to improve the following deficits and impairments:  Decreased activity tolerance, Decreased endurance, Decreased range of motion, Decreased strength, Decreased coordination, Decreased mobility, Decreased scar mobility, Abnormal gait, Increased fascial restricitons, Impaired sensation, Improper body mechanics, Pain, Hypermobility, Increased muscle spasms, Hypomobility, Difficulty walking, Decreased knowledge of precautions, Decreased balance, Postural dysfunction, Cardiopulmonary status limiting activity, Decreased safety awareness  Visit Diagnosis: Other idiopathic scoliosis, thoracolumbar region  Abnormal posture  Other urinary incontinence  Other abnormalities of gait and mobility  Chronic low back pain without sciatica, unspecified back pain  laterality  Sacrococcygeal disorders, not elsewhere classified     Problem List Patient Active Problem List   Diagnosis Date Noted   Unstable angina ([place])    Lumbar burst fracture ([place]) 05/04/2020   Hypogonadism male 10/10/2019   Persistent atrial fibrillation ([place]) 01/12/2019   Acute low back pain 11/04/2017   Abnormal screening cardiac CT    Visit for monitoring Tikosyn therapy 09/09/2017   History of stroke 08/29/2016   Paroxysmal atrial fibrillation ([place]) 07/23/2016   Coronary artery disease 07/23/2016   Snores 06/27/2016   Cardiac pacemaker in situ 06/25/2016   Hemiparesis ([place]) 06/25/2016   Acute ischemic stroke (HCC)    Acute left hemiparesis (HCC)    Hemisensory loss    Dysarthria    Stroke-like symptoms    Complete heart block (HCC)    Pain in thoracic spine    Orthostasis    Lethargy    Occlusion of vertebral artery    TIA (transient ischemic attack) 06/14/2016   Arthritis 02/06/2015   Esophageal reflux 02/06/2015   Arthritis urica 02/06/2015   Cannot sleep 02/06/2015   Arthritis, degenerative 02/06/2015   Adenocarcinoma, renal cell ([place]) 02/06/2015   Benign prostatic hyperplasia with lower urinary tract symptoms 11/21/2014   Personal history of other malignant neoplasm of kidney 11/21/2014   Palpitations 12/31/2013   Hyperlipidemia 11/02/2010   HYPERTENSION, BENIGN 04/16/2010    Jerl Mina, PT 05/14/2021, 6:30 PM  [place], [place], [address] Phone: [phone]   Fax:  [phone]  Name: KEONTAE LEVINGSTON MRN: [id number] Date of Birth: [birth year]

## 2021-05-15 ENCOUNTER — Other Ambulatory Visit: Payer: Self-pay | Admitting: Family Medicine

## 2021-05-28 ENCOUNTER — Ambulatory Visit: Payer: Medicare Other | Admitting: Physical Therapy

## 2021-05-28 ENCOUNTER — Other Ambulatory Visit: Payer: Self-pay

## 2021-05-28 DIAGNOSIS — M4125 Other idiopathic scoliosis, thoracolumbar region: Secondary | ICD-10-CM | POA: Diagnosis not present

## 2021-05-28 DIAGNOSIS — M545 Low back pain, unspecified: Secondary | ICD-10-CM | POA: Diagnosis not present

## 2021-05-28 DIAGNOSIS — R2689 Other abnormalities of gait and mobility: Secondary | ICD-10-CM | POA: Diagnosis not present

## 2021-05-28 DIAGNOSIS — M533 Sacrococcygeal disorders, not elsewhere classified: Secondary | ICD-10-CM | POA: Diagnosis not present

## 2021-05-28 DIAGNOSIS — R293 Abnormal posture: Secondary | ICD-10-CM | POA: Diagnosis not present

## 2021-05-28 DIAGNOSIS — N39498 Other specified urinary incontinence: Secondary | ICD-10-CM

## 2021-05-28 DIAGNOSIS — G8929 Other chronic pain: Secondary | ICD-10-CM | POA: Diagnosis not present

## 2021-05-28 NOTE — Patient Instructions (Signed)
°  Car seat modification  Folded towels to fill bucket seat Folded beach towel long ways against back of seat into head rest to fill in the space so your head and back and sacrum aligns  Hands at 9 and 3 o clock  Feet and heel on the mat  Back of the hips against the seat evenly
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
## 2021-06-05 ENCOUNTER — Encounter: Payer: Self-pay | Admitting: Internal Medicine

## 2021-06-05 ENCOUNTER — Ambulatory Visit (INDEPENDENT_AMBULATORY_CARE_PROVIDER_SITE_OTHER): Payer: Medicare Other | Admitting: Internal Medicine

## 2021-06-05 ENCOUNTER — Other Ambulatory Visit: Payer: Self-pay

## 2021-06-05 VITALS — BP 140/82 | HR 66 | Ht 67.0 in | Wt 165.0 lb

## 2021-06-05 DIAGNOSIS — Z950 Presence of cardiac pacemaker: Secondary | ICD-10-CM | POA: Diagnosis not present

## 2021-06-05 DIAGNOSIS — I442 Atrioventricular block, complete: Secondary | ICD-10-CM

## 2021-06-05 LAB — PACEMAKER DEVICE OBSERVATION

## 2021-06-05 MED ORDER — DOFETILIDE 125 MCG PO CAPS: 125.0000 ug | ORAL_CAPSULE | Freq: Two times a day (BID) | ORAL | 3 refills | Status: DC
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
## 2021-06-05 NOTE — Patient Instructions (Addendum)

## 2021-06-11 ENCOUNTER — Other Ambulatory Visit: Payer: Self-pay

## 2021-06-11 ENCOUNTER — Ambulatory Visit: Payer: Medicare Other | Attending: Urology | Admitting: Physical Therapy

## 2021-06-11 DIAGNOSIS — M533 Sacrococcygeal disorders, not elsewhere classified: Secondary | ICD-10-CM | POA: Diagnosis not present

## 2021-06-11 DIAGNOSIS — M545 Low back pain, unspecified: Secondary | ICD-10-CM | POA: Insufficient documentation

## 2021-06-11 DIAGNOSIS — N39498 Other specified urinary incontinence: Secondary | ICD-10-CM | POA: Diagnosis present

## 2021-06-11 DIAGNOSIS — M4125 Other idiopathic scoliosis, thoracolumbar region: Secondary | ICD-10-CM | POA: Diagnosis not present

## 2021-06-11 DIAGNOSIS — G8929 Other chronic pain: Secondary | ICD-10-CM | POA: Diagnosis not present

## 2021-06-11 DIAGNOSIS — R2689 Other abnormalities of gait and mobility: Secondary | ICD-10-CM | POA: Diagnosis not present

## 2021-06-11 DIAGNOSIS — R293 Abnormal posture: Secondary | ICD-10-CM | POA: Diagnosis not present
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
## 2021-06-11 NOTE — Patient Instructions (Addendum)
Sidelying, knees bent,  Make sure lower thigh and outside of foot is pressed to the table  Green band at the top foot Hands reaching back like ax throwing,  Exhale, straighten knee and "throw the ax"   20 reps

## 2021-06-25 ENCOUNTER — Ambulatory Visit: Payer: Medicare Other | Admitting: Physical Therapy

## 2021-06-29 ENCOUNTER — Ambulatory Visit: Payer: Self-pay | Admitting: *Deleted

## 2021-06-29 NOTE — Telephone Encounter (Signed)
Summary: advice - diarrhea   Pts wife called in stating the pt has diarrhea for a few days, caller wanted to know if the pt needs to be seen or if something can be called in.      Chief Complaint: diarrhea Symptoms: diarrhea Frequency: > 10 times a day  Pertinent Negatives: Patient denies vomiting Disposition: [] ED /[] Urgent Care (no appt availability in office) / [] Appointment(In office/virtual)/ []  [facility]/ [] Home Care/ [] Refused Recommended Disposition /[] [facility]/ [x]  Follow-up with PCP Additional Notes: Will only see Dr. Rosanna Randy no appt available, he is a MD, his wife is a Therapist, sports and wants me to send a note and she can bring a stool specimen. Wife states she thinks it is C-Diff.  Reason for Disposition  [1] SEVERE diarrhea (e.g., 7 or more times / day more than normal) AND [2] present > 24 hours (1 day)  Answer Assessment - Initial Assessment Questions 1. DIARRHEA SEVERITY: "How bad is the diarrhea?" "How many more stools have you had in the past 24 hours than normal?"    - NO DIARRHEA (SCALE 0)   - MILD (SCALE 1-3): Few loose or mushy BMs; increase of 1-3 stools over normal daily number of stools; mild increase in ostomy output.   -  MODERATE (SCALE 4-7): Increase of 4-6 stools daily over normal; moderate increase in ostomy output. * SEVERE (SCALE 8-10; OR 'WORST POSSIBLE'): Increase of 7 or more stools daily over normal; moderate increase in ostomy output; incontinence.     More than 10 times a day, watery 2. ONSET: "When did the diarrhea begin?"      Loose stool Monday then got more watery 3. BM CONSISTENCY: "How loose or watery is the diarrhea?"      watery 4. VOMITING: "Are you also vomiting?" If Yes, ask: "How many times in the past 24 hours?"      no 5. ABDOMINAL PAIN: "Are you having any abdominal pain?" If Yes, ask: "What does it feel like?" (e.g., crampy, dull, intermittent, constant)      cramping 6. ABDOMINAL PAIN SEVERITY: If present, ask:  "How bad is the pain?"  (e.g., Scale 1-10; mild, moderate, or severe)   - MILD (1-3): doesn't interfere with normal activities, abdomen soft and not tender to touch    - MODERATE (4-7): interferes with normal activities or awakens from sleep, abdomen tender to touch    - SEVERE (8-10): excruciating pain, doubled over, unable to do any normal activities       Nagging  7. ORAL INTAKE: If vomiting, "Have you been able to drink liquids?" "How much liquids have you had in the past 24 hours?"     Had vomiting 5 days ago but now just bloated 8. HYDRATION: "Any signs of dehydration?" (e.g., dry mouth [not just dry lips], too weak to stand, dizziness, new weight loss) "When did you last urinate?"     A little, drinking gatorade and broth 9. EXPOSURE: "Have you traveled to a foreign country recently?" "Have you been exposed to anyone with diarrhea?" "Could you have eaten any food that was spoiled?"     no 10. ANTIBIOTIC USE: "Are you taking antibiotics now or have you taken antibiotics in the past 2 months?"       no 11. OTHER SYMPTOMS: "Do you have any other symptoms?" (e.g., fever, blood in stool)       no 12. PREGNANCY: "Is there any chance you are pregnant?" "When was your last menstrual  period?"       no  Protocols used: Diarrhea-A-AH

## 2021-07-04 DIAGNOSIS — I10 Essential (primary) hypertension: Secondary | ICD-10-CM | POA: Diagnosis not present

## 2021-07-04 DIAGNOSIS — Z981 Arthrodesis status: Secondary | ICD-10-CM | POA: Diagnosis not present

## 2021-07-04 DIAGNOSIS — S32029G Unspecified fracture of second lumbar vertebra, subsequent encounter for fracture with delayed healing: Secondary | ICD-10-CM | POA: Diagnosis not present

## 2021-07-09 ENCOUNTER — Other Ambulatory Visit: Payer: Self-pay

## 2021-07-09 ENCOUNTER — Ambulatory Visit: Payer: Medicare Other | Attending: Urology | Admitting: Physical Therapy

## 2021-07-09 DIAGNOSIS — M4125 Other idiopathic scoliosis, thoracolumbar region: Secondary | ICD-10-CM | POA: Insufficient documentation

## 2021-07-09 DIAGNOSIS — G8929 Other chronic pain: Secondary | ICD-10-CM | POA: Diagnosis not present

## 2021-07-09 DIAGNOSIS — M533 Sacrococcygeal disorders, not elsewhere classified: Secondary | ICD-10-CM | POA: Diagnosis not present

## 2021-07-09 DIAGNOSIS — R293 Abnormal posture: Secondary | ICD-10-CM | POA: Diagnosis not present

## 2021-07-09 DIAGNOSIS — N39498 Other specified urinary incontinence: Secondary | ICD-10-CM | POA: Diagnosis present

## 2021-07-09 DIAGNOSIS — M545 Low back pain, unspecified: Secondary | ICD-10-CM | POA: Diagnosis not present

## 2021-07-09 DIAGNOSIS — R2689 Other abnormalities of gait and mobility: Secondary | ICD-10-CM | POA: Diagnosis not present

## 2021-07-09 NOTE — Patient Instructions (Signed)
Blue band with loop at the foot  ? ?Sidelying: stable at the bottom edge of body, not fetal position,  ?Extend knee with pulshing the band loop at ballounds, and then straight the elbow , chin tuck , shoulders down  ? ?10 reps each  ? ? ?___ ? ?Up grade the low cobra/ superman ,  ?On belly, press ? ?Hold band with thumbs pointed out, chin tuck , shoulders down , elbows straight  ?"Squeezing imaginary pencils in armpits" ?10 reps  ?

## 2021-07-10 NOTE — Therapy (Signed)
[place] MAIN [facility] SERVICES [address] [place], [place], [address] Phone: [phone]   Fax:  [phone]  Physical Therapy Treatment  Patient Details  Name: Thomas [name] MRN: [id number] Date of Birth: 12-01-1932 Referring Provider (PT): Dawley and Erlene Quan   Encounter Date: 07/09/2021   PT End of Session - 07/09/21 1509     Visit Number 22    Date for PT Re-Evaluation 07/23/21   09/18/20, PN 02/05/21   Authorization Type [facility] review after 25 visits    PT Start Time 1505    PT Stop Time 1605    PT Time Calculation (min) 60 min    Activity Tolerance Patient tolerated treatment well;No increased pain    Behavior During Therapy WFL for tasks assessed/performed             Past Medical History:  Diagnosis Date   Aortic valve disorders    Arthritis    Atrial flutter ([place]) 06/18/2016   "AF or AFl; not sure which" (06/23/2016)   Basal cell carcinoma    "face, nose left shoulder, left arm" (06/19/2016)   Basal cell carcinoma 09/13/2020   right temple   BBB (bundle branch block)    hx right   Chronic back pain    "neck, thoracic, lower back" (06/19/2016)   Complete heart block ([place]) 06/2016   Dyspnea    GERD (gastroesophageal reflux disease)    Gout    Heart block    "I've had type I, II Wen[place] before now" (06/19/2016)   History of gout    History of hiatal hernia    "self dx'd" (06/19/2016)   Hyperlipidemia    Hypertension    Lyme disease    "dx'd by me 2003; cx's showed dx 08/2015"   Migraine    "3-4/year" (06/19/2016)   Presence of permanent cardiac pacemaker 06/19/2016   PVC's (premature ventricular contractions)    Renal cancer, left ([place]) 2006   S/P cryotherapy   Spinal stenosis    "cervical, 1 thoracic, lumbar" (06/19/2016)   Squamous carcinoma    "face, nose left shoulder, left arm" (06/19/2016)   Stroke ([place])    TIA (transient ischemic attack) 06/14/2016   "I'm not sure that's what it was" (06/25/2016)   Visit for  monitoring Tikosyn therapy 09/09/2017    Past Surgical History:  Procedure Laterality Date   ANKLE FRACTURE SURGERY Right 1967   BACK SURGERY  05/07/2020   BASAL CELL CARCINOMA EXCISION     "face, nose left shoulder, left arm"   BIOPSY PROSTATE  2001 & 2003   CARDIAC CATHETERIZATION  1990's   CARDIOVERSION N/A 09/11/2017   Procedure: CARDIOVERSION;  Surgeon: Lelon Perla, MD;  Location: [facility];  Service: Cardiovascular;  Laterality: N/A;   FRACTURE SURGERY     HOLEP-LASER ENUCLEATION OF THE PROSTATE WITH MORCELLATION N/A 07/10/2020   Procedure: HOLEP-LASER ENUCLEATION OF THE PROSTATE WITH MORCELLATION;  Surgeon: Hollice Espy, MD;  Location: [facility];  Service: Urology;  Laterality: N/A;   INGUINAL HERNIA REPAIR Left 2012   INSERT / REPLACE / REMOVE PACEMAKER  06/19/2016   LAPAROSCOPIC ABLATION RENAL MASS     LEFT HEART CATH AND CORONARY ANGIOGRAPHY Left 10/23/2017   Procedure: LEFT HEART CATH AND CORONARY ANGIOGRAPHY;  Surgeon: Wellington Hampshire, MD;  Location: [facility];  Service: Cardiovascular;  Laterality: Left;   LEFT HEART CATH AND CORONARY ANGIOGRAPHY Left 02/26/2021   Procedure: LEFT HEART CATH AND CORONARY ANGIOGRAPHY;  Surgeon: Fletcher Anon,  Chelsea Aus, MD;  Location: [facility];  Service: Cardiovascular;  Laterality: Left;   PACEMAKER IMPLANT N/A 06/19/2016   Procedure: Pacemaker Implant;  Surgeon: Duke Salvia, MD;  Location: [facility];  Service: Cardiovascular;  Laterality: N/A;   pacemasker     PROSTATE SURGERY     SQUAMOUS CELL CARCINOMA EXCISION     "face, nose left shoulder, left arm"   TONSILLECTOMY AND ADENOIDECTOMY      There were no vitals filed for this visit.   Subjective Assessment - 07/09/21 1507     Subjective Pt no longer feels his back pain. Pt is not noticing as much numbness in L groin area. Pt has the feeling of fullness of his bladder and GI. He feels he is emptying his bladder halfway. Pt still feels he can not  contract effectively  his urethral / rectal sphincter                [facility] PT Assessment - 07/10/21 0826       Observation/Other Assessments   Observations more anterior tilt of pelvis, less thoracic kyphosis      Coordination   Coordination and Movement Description upper trapp overuse , required cues for scapular retraction      Palpation   Palpation comment hypomobile C/T junction , tightness of interspinal/ scalenes, platysmus, SCM      Bed Mobility   Bed Mobility --   no nlonger required two pillows in supine, one pillow today                          [facility] Adult PT Treatment/Exercise - 07/10/21 8082       Neuro Re-ed    Neuro Re-ed Details  , required cued for cerivo-scapular retraction with resistance band to promote less forward head posture      Moist Heat Therapy   Number Minutes Moist Heat 5 Minutes    Moist Heat Location Cervical      Manual Therapy   Manual therapy comments STM/MWM , distraction, grade II mob medial glide at C/T junction to promote rotation/ cervical retraction and upright posture                          PT Long Term Goals - 07/09/21 1510       PT LONG TERM GOAL #1   Title Pt will complete a chart detailing the method he uses for continence in relationship with activities across each day of the week in order to gather data for baseline    Time 2    Period Weeks    Status Achieved      PT LONG TERM GOAL #2   Title Pt will improve gait mechanics and posture  to increase walking endurance from 20 min to > 30 min in order to walk with less pain    Time 6    Period Weeks    Status Achieved      PT LONG TERM GOAL #3   Title Pt will demo less forward head posture from 30 cm from earlobe to wall to < 25 cm in order to achieve more upright posture to optimize IAP system for postural stability ( less LBP) and urinary continence 12/19/20: 26 cm  03/05/21: 24 cm , 05/14/21: 25 cm)  )    Time 8    Period Weeks     Status Achieved  PT LONG TERM GOAL #4   Title Pt will demo IND with deep core coordination and pelvic floor coordination without compensatory patterns to help with ADLs    Time 4    Period Weeks    Status Achieved      PT LONG TERM GOAL #5   Title Pt will demo less L thoracic shift, less convex curve at T/L junction and more reciprocal gait pattern in order to regain structural midline and to progress to pelvic floor contractions with better outcomes    Time 10    Period Weeks    Status Achieved      PT LONG TERM GOAL #6   Title Pt will report decreased pain by 50% with Bending over, getting out of bed, reaching up overhead activities in order to perform his hobbies and household chores.    Time 8    Period Weeks    Status Achieved      PT LONG TERM GOAL #7   Title Pt will demo proper deep core coordination, pelvic floor contractions 3 sec, 3 reps in order to to minimize leakage while gardening    Baseline chest breathing ( limited anterior diaphragmatic breathing)    Time 8    Period Weeks    Status Achieved      PT LONG TERM GOAL #8   Title Pt will report getting the sensation of fullness of bladder and being able to empty completely across 2 weeks (05/14/21: still straining, 05/28/21: straining less but still has decreaased awareness of full bladder, 07/09/21: feeling of fullnes of bladder but straining to get the last 50% of the urine  )    Time 10    Period Weeks    Status Partially Met    Target Date 05/14/21      PT LONG TERM GOAL  #9   TITLE Pt will demo increased hip abduction on L from 3+/5 to > 4/5 in order to improve pelvic girdle stability    Time 4    Period Weeks    Status New    Target Date 06/25/21                   Plan - 07/09/21 1509     Clinical Impression Statement Pt is making progress with report of no more back pain and no more numbness in R groin. Focusing on improving complete emptying of urine for upcoming sessions. Applied manual  technique today which pt tolerated without pain. Pt demo'd improved cervical mobility and less hypomobile at C/T junction post manual Tx. Upgraded HEP to further promote cervico-thoracic stabilization. Anticipate as pt restore his posture at upper spine ( less forward head/ thoracic kyphosis) and anterior pelvic tilt, pt will have better outcomes with emptying bladder. Pt continues to benefit from skilled PT   Personal Factors and Comorbidities Comorbidity 3+    Comorbidities Lumbar fusion L1-3    Examination-Activity Limitations Toileting;Lift;Bed Mobility;Locomotion Level;Bend;Sit;Sleep;Continence;Squat;Stand;Transfers;Reach Overhead    Stability/Clinical Decision Making Evolving/Moderate complexity    Rehab Potential Good    PT Frequency 1x / week    PT Duration Other (comment)   10   PT Treatment/Interventions Moist Heat;Therapeutic activities;Therapeutic exercise;Patient/family education;Neuromuscular re-education;Stair training;Gait training;Manual techniques;Taping;Splinting;Dry needling;Balance training;ADLs/Self Care Home Management;Cryotherapy;Traction;Functional mobility training;Energy conservation;Joint Manipulations;Passive range of motion;Scar mobilization    Consulted and Agree with Plan of Care Patient;Family member/caregiver    Family Member Consulted wife             Patient will benefit from  skilled therapeutic intervention in order to improve the following deficits and impairments:  Decreased activity tolerance, Decreased endurance, Decreased range of motion, Decreased strength, Decreased coordination, Decreased mobility, Decreased scar mobility, Abnormal gait, Increased fascial restricitons, Impaired sensation, Improper body mechanics, Pain, Hypermobility, Increased muscle spasms, Hypomobility, Difficulty walking, Decreased knowledge of precautions, Decreased balance, Postural dysfunction, Cardiopulmonary status limiting activity, Decreased safety awareness  Visit  Diagnosis: Other idiopathic scoliosis, thoracolumbar region  Abnormal posture  Other urinary incontinence  Other abnormalities of gait and mobility  Chronic low back pain without sciatica, unspecified back pain laterality  Sacrococcygeal disorders, not elsewhere classified     Problem List Patient Active Problem List   Diagnosis Date Noted   Unstable angina ([place])    Lumbar burst fracture ([place]) 05/04/2020   Hypogonadism male 10/10/2019   Persistent atrial fibrillation ([place]) 01/12/2019   Acute low back pain 11/04/2017   Abnormal screening cardiac CT    Visit for monitoring Tikosyn therapy 09/09/2017   History of stroke 08/29/2016   Paroxysmal atrial fibrillation ([place]) 07/23/2016   Coronary artery disease 07/23/2016   Snores 06/27/2016   Cardiac pacemaker in situ 06/25/2016   Hemiparesis ([place]) 06/25/2016   Acute ischemic stroke ([facility])    Acute left hemiparesis ([facility])    Hemisensory loss    Dysarthria    Stroke-like symptoms    Complete heart block ([facility])    Pain in thoracic spine    Orthostasis    Lethargy    Occlusion of vertebral artery    TIA (transient ischemic attack) 06/14/2016   Arthritis 02/06/2015   Esophageal reflux 02/06/2015   Arthritis urica 02/06/2015   Cannot sleep 02/06/2015   Arthritis, degenerative 02/06/2015   Adenocarcinoma, renal cell ([place]) 02/06/2015   Benign prostatic hyperplasia with lower urinary tract symptoms 11/21/2014   Personal history of other malignant neoplasm of kidney 11/21/2014   Palpitations 12/31/2013   Hyperlipidemia 11/02/2010   HYPERTENSION, BENIGN 04/16/2010    Jerl Mina, PT 07/10/2021, 8:32 AM  [place] [facility] MAIN [facility] SERVICES [place], [place], [address] Phone: [phone]   Fax:  [phone]  Name: Thomas [name] MRN: [id number] Date of Birth: 03-Apr-1933

## 2021-07-14 IMAGING — CR DG LUMBAR SPINE 2-3V
2 series · 2 of 2 positions shown · non-contrast
Comparison: Intraoperative films from the previous day.

CLINICAL DATA: Recent lumbar fusion, known L2 fracture

EXAM:
LUMBAR SPINE - 2 VIEW

[l-spine ap]
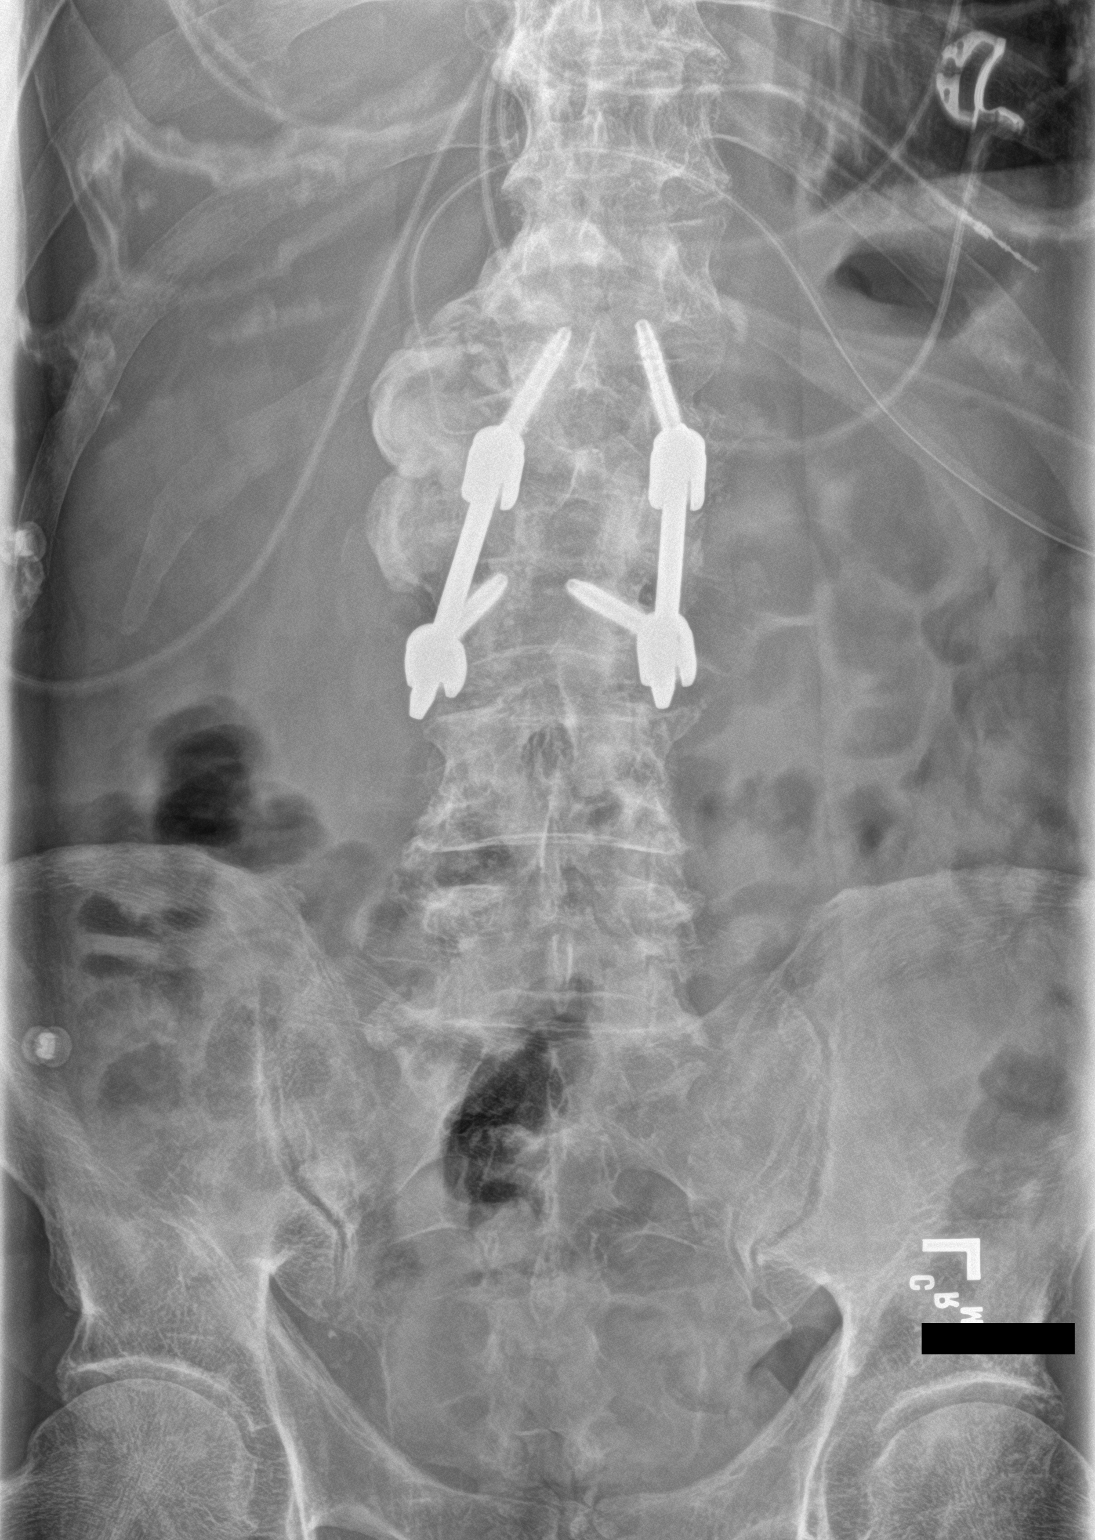

[l-spine lat]
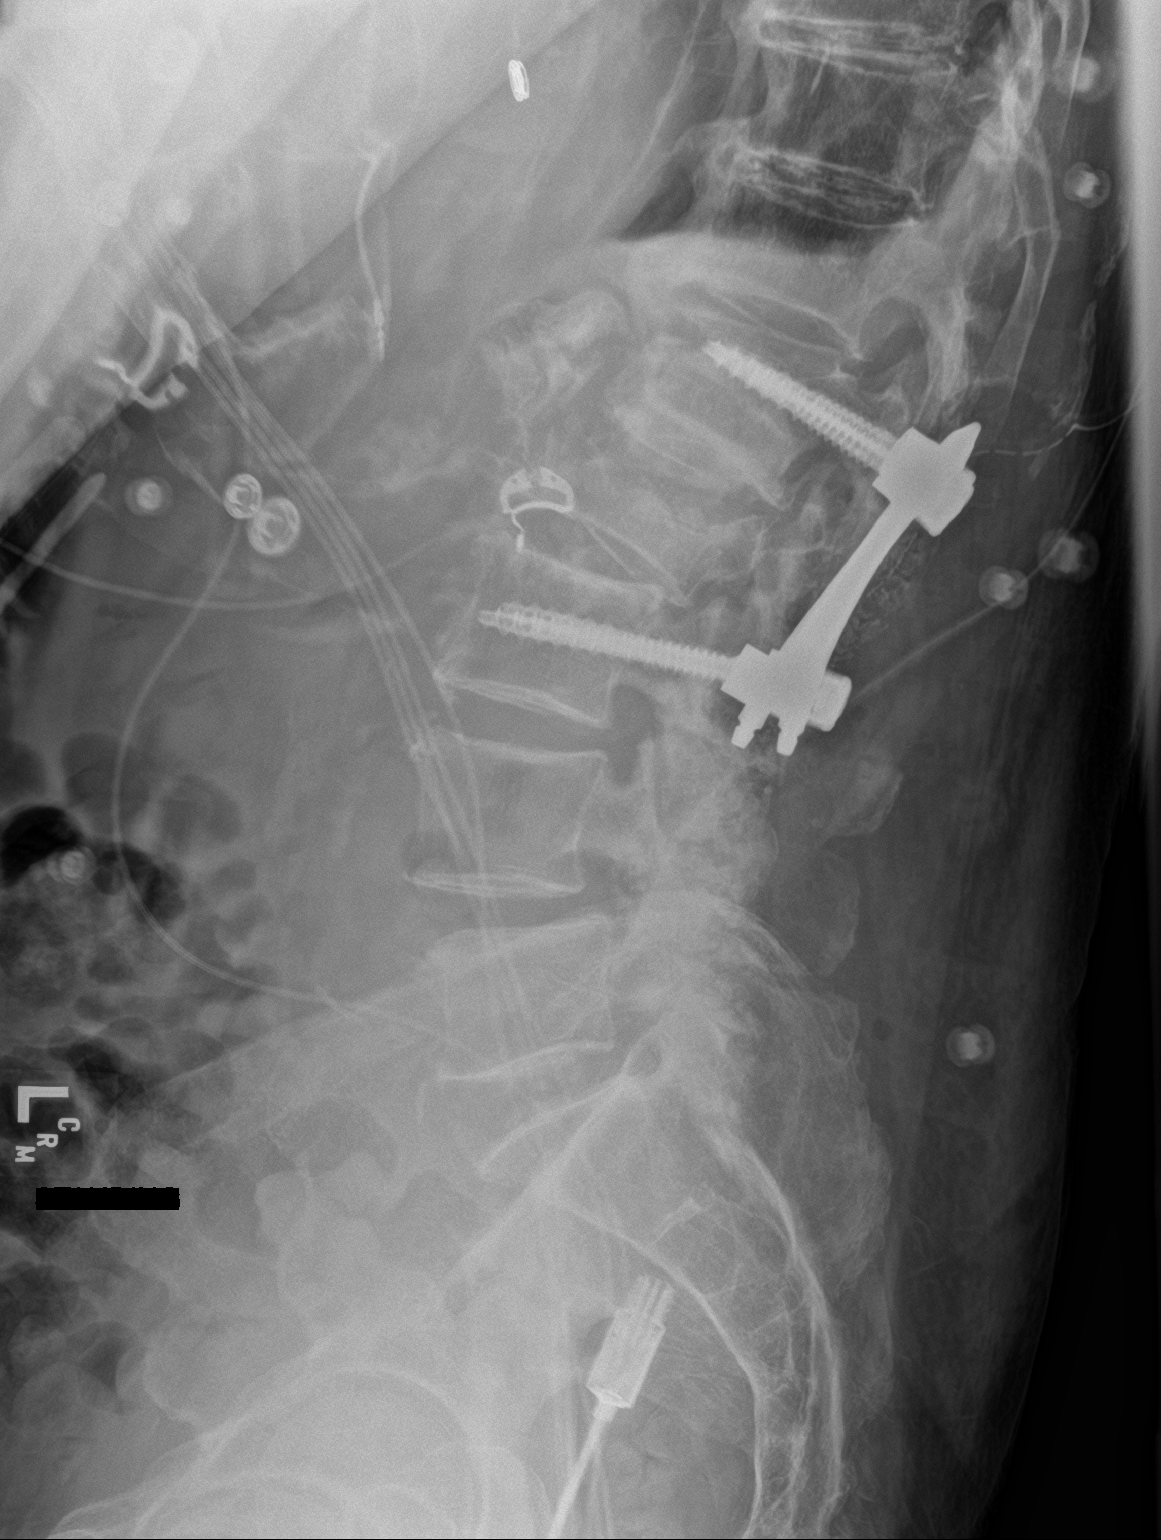

[2 of 2 positions shown; findings below may reference images not displayed]

FINDINGS: L2 compression deformity is again identified. L1 and L3 pedicle
screws are seen with posterior fixation identified. The overall
appearance is stable from the intraoperative exam. Mild degenerative
anterolisthesis of L4 on L5 is seen.
IMPRESSION: Status post fusion at L1 and L3. Stable L2 compression deformity is
noted.

## 2021-07-23 ENCOUNTER — Ambulatory Visit: Payer: Medicare Other | Admitting: Physical Therapy

## 2021-07-31 ENCOUNTER — Ambulatory Visit (INDEPENDENT_AMBULATORY_CARE_PROVIDER_SITE_OTHER): Payer: Medicare Other

## 2021-07-31 DIAGNOSIS — I442 Atrioventricular block, complete: Secondary | ICD-10-CM | POA: Diagnosis not present

## 2021-07-31 LAB — CUP PACEART REMOTE DEVICE CHECK
Battery Remaining Longevity: 53 mo
Battery Voltage: 2.96 V
Brady Statistic AP VP Percent: 93.75 %
Brady Statistic AP VS Percent: 0 %
Brady Statistic AS VP Percent: 6.22 %
Brady Statistic AS VS Percent: 0.03 %
Brady Statistic RA Percent Paced: 91.61 %
Brady Statistic RV Percent Paced: 99.97 %
Date Time Interrogation Session: 20230328020625
Implantable Lead Implant Date: 20180214
Implantable Lead Implant Date: 20180214
Implantable Lead Location: 753859
Implantable Lead Location: 753860
Implantable Lead Model: 5076
Implantable Lead Model: 5076
Implantable Pulse Generator Implant Date: 20180214
Lead Channel Impedance Value: 285 Ohm
Lead Channel Impedance Value: 323 Ohm
Lead Channel Impedance Value: 361 Ohm
Lead Channel Impedance Value: 399 Ohm
Lead Channel Pacing Threshold Amplitude: 0.5 V
Lead Channel Pacing Threshold Amplitude: 0.875 V
Lead Channel Pacing Threshold Pulse Width: 0.4 ms
Lead Channel Pacing Threshold Pulse Width: 0.4 ms
Lead Channel Sensing Intrinsic Amplitude: 2.875 mV
Lead Channel Sensing Intrinsic Amplitude: 2.875 mV
Lead Channel Sensing Intrinsic Amplitude: 4.625 mV
Lead Channel Sensing Intrinsic Amplitude: 4.625 mV
Lead Channel Setting Pacing Amplitude: 2 V
Lead Channel Setting Pacing Amplitude: 2.5 V
Lead Channel Setting Pacing Pulse Width: 0.4 ms
Lead Channel Setting Sensing Sensitivity: 2 mV

## 2021-08-06 ENCOUNTER — Other Ambulatory Visit: Payer: Self-pay | Admitting: Urology

## 2021-08-06 ENCOUNTER — Telehealth: Payer: Self-pay | Admitting: Cardiovascular Disease

## 2021-08-06 NOTE — Telephone Encounter (Signed)
?*  STAT* If patient is at the pharmacy, call can be transferred to refill team. ? ? ?1. Which medications need to be refilled? (please list name of each medication and dose if known) new prescription for Eliquis2.5 mg if this is what pt is taking, or is getting 5 mg and cutting them in half ? ?2. Which pharmacy/location (including street and city if local pharmacy) is medication to be sent to? [facility] ? ?3. Do they need a 30 day or 90 day supply? 30 days and refills  ? ?

## 2021-08-06 NOTE — Telephone Encounter (Signed)
Please review

## 2021-08-06 NOTE — Telephone Encounter (Signed)
Pt last saw Dr Caryl Comes 06/05/21, last labs 02/15/21 Creat 1.43, Age 88, weight 74.8kg, based on specified criteria pt is not on appropriate dosage of Eliquis.  Pt has history of CKD stage IIIb, previous partial nephrectomy due to CA.   ?Creat remains borderline 02/15/21 1.43, 11/21/20 Creat 1.55, 11/01/20 Creat 1.39, 07/19/20 Creat 1.34, 05/08/20 1.34, 05/07/20 Creat 1.16.  Pt has only had 1 Creat >1.5 in past year of BMPs.  Will forward this information to Dr Fletcher Anon pt's primary cardiologist and ask if dosage change is appropriate.  Will defer this decision to MD.  Sent staff message to Dr Fletcher Anon will await response.  ? ?

## 2021-08-07 ENCOUNTER — Encounter: Payer: Self-pay | Admitting: Internal Medicine

## 2021-08-07 ENCOUNTER — Other Ambulatory Visit: Payer: Self-pay | Admitting: Cardiovascular Disease

## 2021-08-07 DIAGNOSIS — I480 Paroxysmal atrial fibrillation: Secondary | ICD-10-CM

## 2021-08-07 NOTE — Telephone Encounter (Signed)
This is a duplicate request, there is a refill message/phone message in Epic in pt's chart from 08/06/21.  It has been documented on and has been sent to Dr Fletcher Anon to address Eliquis dosge.  Please see previous refill request and await Dr Ruffin Pyo response before refilling pt's Eliquis.  ?Eliquis 2.'5mg'$  BID on pt's medication list, but based on most recent labwork pt qualifies for '5mg'$  BID dosing.  ?

## 2021-08-07 NOTE — Telephone Encounter (Signed)
Refill request

## 2021-08-08 ENCOUNTER — Telehealth: Payer: Self-pay | Admitting: Urology

## 2021-08-08 NOTE — Telephone Encounter (Signed)
Prescriptions for Trimix 30/01/10 and phenylephrine 123mg/mL called into [facility] pharmacy.  ?

## 2021-08-09 NOTE — Telephone Encounter (Signed)
Eliquis '5mg'$  refill request received. Patient is 88 years old, weight-74.8kg, Crea-1.43 on 02/15/2021, Diagnosis-Afib, and last seen by Dr. Caryl Comes on 06/05/2021. Dose is appropriate based on dosing criteria. Will send in refill to requested pharmacy.   ? ?Also, per Dr. Tyrell Antonio message from 08/09/2021, the pt should be taken Eliquis '5mg'$  BID. Called pt to update him that the refill was being sent but there was no answer so left a message.  ?

## 2021-08-09 NOTE — Telephone Encounter (Signed)
Agree with Eliquis 5 mg twice daily. ?

## 2021-08-10 ENCOUNTER — Encounter: Payer: Medicare Other | Admitting: Physical Therapy

## 2021-08-13 NOTE — Progress Notes (Signed)
Remote pacemaker transmission.   

## 2021-08-15 ENCOUNTER — Ambulatory Visit: Payer: Medicare Other | Admitting: Family Medicine

## 2021-08-21 ENCOUNTER — Encounter: Payer: Self-pay | Admitting: Family Medicine

## 2021-08-21 ENCOUNTER — Ambulatory Visit (INDEPENDENT_AMBULATORY_CARE_PROVIDER_SITE_OTHER): Payer: Medicare Other | Admitting: Family Medicine

## 2021-08-21 VITALS — BP 126/73 | HR 61 | Resp 16 | Wt 160.0 lb

## 2021-08-21 DIAGNOSIS — Z8673 Personal history of transient ischemic attack (TIA), and cerebral infarction without residual deficits: Secondary | ICD-10-CM

## 2021-08-21 DIAGNOSIS — I4819 Other persistent atrial fibrillation: Secondary | ICD-10-CM | POA: Diagnosis not present

## 2021-08-21 DIAGNOSIS — M810 Age-related osteoporosis without current pathological fracture: Secondary | ICD-10-CM

## 2021-08-21 DIAGNOSIS — M109 Gout, unspecified: Secondary | ICD-10-CM

## 2021-08-21 DIAGNOSIS — I442 Atrioventricular block, complete: Secondary | ICD-10-CM

## 2021-08-21 DIAGNOSIS — R350 Frequency of micturition: Secondary | ICD-10-CM | POA: Diagnosis not present

## 2021-08-21 DIAGNOSIS — N401 Enlarged prostate with lower urinary tract symptoms: Secondary | ICD-10-CM

## 2021-08-21 DIAGNOSIS — E782 Mixed hyperlipidemia: Secondary | ICD-10-CM | POA: Diagnosis not present

## 2021-08-21 DIAGNOSIS — R739 Hyperglycemia, unspecified: Secondary | ICD-10-CM

## 2021-08-21 DIAGNOSIS — S32001D Stable burst fracture of unspecified lumbar vertebra, subsequent encounter for fracture with routine healing: Secondary | ICD-10-CM | POA: Diagnosis not present

## 2021-08-21 DIAGNOSIS — M79644 Pain in right finger(s): Secondary | ICD-10-CM | POA: Diagnosis not present

## 2021-08-21 DIAGNOSIS — E291 Testicular hypofunction: Secondary | ICD-10-CM

## 2021-08-21 DIAGNOSIS — I10 Essential (primary) hypertension: Secondary | ICD-10-CM | POA: Diagnosis not present

## 2021-08-21 DIAGNOSIS — G8929 Other chronic pain: Secondary | ICD-10-CM

## 2021-08-21 DIAGNOSIS — I2510 Atherosclerotic heart disease of native coronary artery without angina pectoris: Secondary | ICD-10-CM | POA: Diagnosis not present

## 2021-08-21 DIAGNOSIS — C642 Malignant neoplasm of left kidney, except renal pelvis: Secondary | ICD-10-CM

## 2021-08-21 MED ORDER — ATORVASTATIN CALCIUM 40 MG PO TABS: 40.0000 mg | ORAL_TABLET | Freq: Every day | ORAL | 3 refills | Status: DC

## 2021-08-21 MED ORDER — MIRABEGRON ER 25 MG PO TB24: 25.0000 mg | ORAL_TABLET | Freq: Every day | ORAL | 3 refills | Status: DC

## 2021-08-21 MED ORDER — FEBUXOSTAT 40 MG PO TABS: 40.0000 mg | ORAL_TABLET | Freq: Every day | ORAL | 3 refills | Status: AC

## 2021-08-21 NOTE — Patient Instructions (Signed)
TRY ROBITUSSIN TWICE A DAY.  

## 2021-08-21 NOTE — Progress Notes (Signed)
?  ? ? ?Established patient visit ? ?I,Thomas [name],acting as a scribe for Thomas Durie, MD.,have documented all relevant documentation on the behalf of Thomas Durie, MD,as directed by  Thomas Durie, MD while in the presence of Thomas Durie, MD. ? ? ?Patient: Thomas [name]   DOB: [birth year]   88 y.o. Male  MRN: [id number] ?Visit Date: 08/21/2021 ? ?Today's healthcare provider: [name], MD  ? ?Chief Complaint  ?Patient presents with  ? Follow-up  ? Hypertension  ? Hyperlipidemia  ? ?Subjective  ?  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  He is continuing to recover from a burst fracture of L2 from an accident a year ago. ?He continues to have issues from that accident but is doing well. ?He takes Eliquis at 5 mg twice a day. ?He needs refills on Uloric Myrbetriq and Lipitor. 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 ?Hypertension, follow-up ? ?BP Readings from Last 3 Encounters:  ?08/21/21 126/73  ?06/05/21 140/82  ?03/20/21 109/63  ? Wt Readings from Last 3 Encounters:  ?08/21/21 160 lb (72.6 kg)  ?06/05/21 165 lb (74.8 kg)  ?03/20/21 157 lb 4.8 oz (71.4 kg)  ?  ? ?He was last seen for hypertension 6 months ago.  ?BP at that visit was 109/63. ?Management since that visit includes; taking amlodipine and metoprolol. ?He reports good compliance with treatment. ?He is not having side effects. none ?He is exercising. ?He is adherent to low salt diet.   ?Outside blood pressures are 135/80-140/85. ? ?He does not smoke. ? ?Use of agents associated with hypertension: none.  ? ?--------------------------------------------------------------------------------------------------- ?Lipid/Cholesterol, follow-up ? ?Last Lipid Panel: ?Lab Results  ?Component Value Date  ? CHOL 125 11/01/2020  ? [place] 70 11/01/2020  ? HDL 41 11/01/2020  ? TRIG  69 11/01/2020  ? ? ?He was last seen for this 10 months ago.  ?Management since that visit includes; taking atorvastatin. ? ?He reports good compliance with treatment. ?He is not having side effects. none ? ?He is following a Regular, Low Sodium diet. ?Current exercise: walking ? ?Last metabolic panel ?Lab Results  ?Component Value Date  ? GLUCOSE 101 (H) 02/15/2021  ? NA 134 (L) 02/15/2021  ? K 4.8 02/15/2021  ? BUN 34 (H) 02/15/2021  ? CREATININE 1.43 (H) 02/15/2021  ? EGFR 43 (L) 11/21/2020  ? GFRNONAA 48 (L) 02/15/2021  ? CALCIUM 9.7 02/15/2021  ? AST 20 11/01/2020  ? ALT 17 11/01/2020  ? ?The ASCVD Risk score (Arnett DK, et al., 2019) failed to calculate for the following reasons: ?  The 2019 ASCVD risk score is only valid for ages 59 to 65 ?  The patient has a prior MI or stroke diagnosis ? ?--------------------------------------------------------------------------------------------------- ? ? ?Medications: ?Outpatient Medications Prior to Visit  ?Medication Sig  ? amLODipine (NORVASC) 5 MG tablet TAKE 1 TABLET BY MOUTH DAILY  ? atorvastatin (LIPITOR) 40 MG tablet Take 1 tablet (40 mg total) by mouth daily.  ? calcium carbonate (TUMS - DOSED IN MG ELEMENTAL CALCIUM) 500 MG chewable tablet Chew 1-2 tablets by mouth daily as needed for indigestion.  ? Cholecalciferol (VITAMIN D3) 5000 units CAPS Take 5,000 Units by mouth daily.  ? Coenzyme Q10 (COQ10) 100 MG CAPS Take 100 mg by mouth daily.  ? Cyanocobalamin (VITAMIN B-12 PO) Take  3,000 mcg by mouth daily with breakfast.  ? dofetilide (TIKOSYN) 125 MCG capsule Take 1 capsule (125 mcg total) by mouth 2 (two) times daily.  ? ELIQUIS 5 MG TABS tablet TAKE 1 TABLET BY MOUTH TWICE A DAY  ? febuxostat (ULORIC) 40 MG tablet TAKE 1 TABLET BY MOUTH DAILY  ? gabapentin (NEURONTIN) 300 MG capsule Take 300 mg by mouth 2 (two) times daily.  ? Magnesium 500 MG TABS Take 500 mg by mouth at bedtime.  ? methocarbamol (ROBAXIN) 750 MG tablet Take 750 mg by mouth 2 times daily at  12 noon and 4 pm.  ? metoprolol succinate (TOPROL-XL) 50 MG 24 hr tablet TAKE 1 TABLET BY MOUTH DAILY WITH OR IMMEDIATELY FOLLOWING A MEAL  ? metroNIDAZOLE (FLAGYL) 500 MG tablet TAKE 1 TABLET BY MOUTH TWICE DAILY ON SUNDAY AND MONDAY  ? mirabegron ER (MYRBETRIQ) 25 MG TB24 tablet Take 1 tablet (25 mg total) by mouth daily.  ? Multiple Vitamins-Minerals (PRESERVISION/LUTEIN PO) Take 1 capsule by mouth in the morning.  ? NEEDLE, DISP, 18 G (BD DISP NEEDLES) 18G X 1-1/2" MISC 1 mg by Does not apply route every 14 (fourteen) days.  ? NEEDLE, DISP, 21 G (BD DISP NEEDLES) 21G X 1-1/2" MISC 1 mg by Does not apply route every 14 (fourteen) days.  ? omeprazole (PRILOSEC) 20 MG capsule TAKE ONE CAPSULE BY MOUTH EVERY DAY FOR HEARTBURN TAKE 30 MINUTES BEFORE A MEAL  ? oxyCODONE (OXY IR/ROXICODONE) 5 MG immediate release tablet Take 1 tablet (5 mg total) by mouth every 6 (six) hours as needed for moderate pain or breakthrough pain.  ? oxyCODONE-acetaminophen (PERCOCET) 7.5-325 MG tablet Take 1-2 tablets by mouth every 4 (four) hours as needed for severe pain.  ? polyethylene glycol (MIRALAX / GLYCOLAX) 17 g packet Take 17 g by mouth daily as needed for moderate constipation.  ? predniSONE (DELTASONE) 50 MG tablet Take 10/23 at 8:30 pm, 10/24 at 02:30 am, and 10/24 at 08:30 am (take with Benadryl 50 mg at 08:30 am)  ? propranolol (INDERAL) 40 MG tablet TAKE ONE (1) TABLET THREE (3) TIMES EACHDAY AS NEEDED  ? senna (SENOKOT) 8.6 MG tablet Take 1 tablet by mouth 2 (two) times daily as needed for constipation.  ? sildenafil (REVATIO) 20 MG tablet Take 3-5 tablets 1 hr prior  ? Syringe, Disposable, (2-3CC SYRINGE) 3 ML MISC 1 mg by Does not apply route every 14 (fourteen) days.  ? tamsulosin (FLOMAX) 0.4 MG CAPS capsule Take 0.4 mg by mouth at bedtime.  ? terazosin (HYTRIN) 1 MG capsule TAKE ONE CAPSULE BY MOUTH AT BEDTIME.  ? Testosterone 20.25 MG/ACT (1.62%) GEL APPLY 2 PUMPS DAILY.  ? testosterone cypionate (DEPOTESTOSTERONE  CYPIONATE) 200 MG/ML injection Inject 1 mL (200 mg total) into the muscle every 28 (twenty-eight) days.  ? traMADol (ULTRAM) 50 MG tablet Take 50-100 mg by mouth 4 (four) times daily as needed for severe pain.  ? Turmeric 500 MG CAPS Take 500 mg by mouth daily.  ? ?No facility-administered medications prior to visit.  ? ? ?Review of Systems  ?Constitutional:  Negative for appetite change, chills and fever.  ?Respiratory:  Negative for chest tightness, shortness of breath and wheezing.   ?Cardiovascular:  Negative for chest pain and palpitations.  ?Gastrointestinal:  Negative for abdominal pain, nausea and vomiting.  ? ?Last thyroid functions ?Lab Results  ?Component Value Date  ? TSH 1.460 11/01/2020  ? ?  ?  Objective  ?  ?BP 126/73 (BP Location:  Right Arm, Patient Position: Sitting, Cuff Size: Normal)   Pulse 61   Resp 16   Wt 160 lb (72.6 kg)   SpO2 96%   BMI 25.06 kg/m?  ?BP Readings from Last 3 Encounters:  ?08/21/21 126/73  ?06/05/21 140/82  ?03/20/21 109/63  ? ?Wt Readings from Last 3 Encounters:  ?08/21/21 160 lb (72.6 kg)  ?06/05/21 165 lb (74.8 kg)  ?03/20/21 157 lb 4.8 oz (71.4 kg)  ? ?  ? ?Physical Exam ?Vitals reviewed.  ?Constitutional:   ?   Appearance: Normal appearance. He is well-developed.  ?   Comments: He appears younger than his age of 76  ?HENT:  ?   Head: Normocephalic and atraumatic.  ?   Right Ear: External ear normal.  ?   Left Ear: External ear normal.  ?Eyes:  ?   General: No scleral icterus. ?   Conjunctiva/sclera: Conjunctivae normal.  ?   Pupils: Pupils are equal, round, and reactive to light.  ?Cardiovascular:  ?   Rate and Rhythm: Normal rate and regular rhythm.  ?   Heart sounds: Normal heart sounds.  ?Pulmonary:  ?   Effort: Pulmonary effort is normal. No respiratory distress.  ?   Breath sounds: Normal breath sounds.  ?Abdominal:  ?   Palpations: Abdomen is soft.  ?Genitourinary: ?   Rectum: Guaiac result negative.  ?Musculoskeletal:     ?   General: No tenderness.  ?    Cervical back: Normal range of motion and neck supple.  ?Skin: ?   Findings: No erythema or rash.  ?Neurological:  ?   Mental Status: He is alert and oriented to person, place, and time.  ?Psychiatric:     ?   Be

## 2021-08-22 LAB — COMPREHENSIVE METABOLIC PANEL
ALT: 16 IU/L (ref 0–44)
AST: 25 IU/L (ref 0–40)
Albumin/Globulin Ratio: 1.6 (ref 1.2–2.2)
Albumin: 4.6 g/dL (ref 3.6–4.6)
Alkaline Phosphatase: 91 IU/L (ref 44–121)
BUN/Creatinine Ratio: 19 (ref 10–24)
BUN: 27 mg/dL (ref 8–27)
Bilirubin Total: 0.6 mg/dL (ref 0.0–1.2)
CO2: 25 mmol/L (ref 20–29)
Calcium: 9.6 mg/dL (ref 8.6–10.2)
Chloride: 99 mmol/L (ref 96–106)
Creatinine, Ser: 1.43 mg/dL — ABNORMAL HIGH (ref 0.76–1.27)
Globulin, Total: 2.8 g/dL (ref 1.5–4.5)
Glucose: 91 mg/dL (ref 70–99)
Potassium: 5 mmol/L (ref 3.5–5.2)
Sodium: 138 mmol/L (ref 134–144)
Total Protein: 7.4 g/dL (ref 6.0–8.5)
eGFR: 48 mL/min/{1.73_m2} — ABNORMAL LOW (ref >59)

## 2021-08-22 LAB — HEMOGLOBIN A1C
Est. average glucose Bld gHb Est-mCnc: 117 mg/dL
Hgb A1c MFr Bld: 5.7 % — ABNORMAL HIGH (ref 4.8–5.6)

## 2021-08-22 LAB — MAGNESIUM: Magnesium: 2.3 mg/dL (ref 1.6–2.3)

## 2021-08-24 ENCOUNTER — Ambulatory Visit: Payer: Medicare Other | Attending: Urology | Admitting: Physical Therapy

## 2021-08-24 DIAGNOSIS — R293 Abnormal posture: Secondary | ICD-10-CM | POA: Insufficient documentation

## 2021-08-24 DIAGNOSIS — G8929 Other chronic pain: Secondary | ICD-10-CM | POA: Insufficient documentation

## 2021-08-24 DIAGNOSIS — M545 Low back pain, unspecified: Secondary | ICD-10-CM | POA: Insufficient documentation

## 2021-08-24 DIAGNOSIS — R2689 Other abnormalities of gait and mobility: Secondary | ICD-10-CM | POA: Diagnosis not present

## 2021-08-24 DIAGNOSIS — M4125 Other idiopathic scoliosis, thoracolumbar region: Secondary | ICD-10-CM | POA: Insufficient documentation

## 2021-08-24 DIAGNOSIS — M533 Sacrococcygeal disorders, not elsewhere classified: Secondary | ICD-10-CM | POA: Insufficient documentation

## 2021-08-24 DIAGNOSIS — N39498 Other specified urinary incontinence: Secondary | ICD-10-CM | POA: Diagnosis present

## 2021-08-24 NOTE — Patient Instructions (Signed)
Withhold pelvic floor exercises ? ?Deep core level 1-2  ? ?

## 2021-08-24 NOTE — Therapy (Signed)
[facility] ?[place], [place], [address] ?Phone: [phone]   Fax:  [phone] ? ?Physical Therapy Treatment ? ?Patient Details  ?Name: Thomas [name] ?MRN: [id number] ?Date of Birth: May 25, 1932 ?Referring Provider (PT): Dawley and Erlene Quan ? ? ?Encounter Date: 08/24/2021 ? ? PT End of Session - 08/24/21 1308   ? ? Visit Number [id number]   ? Date for PT Re-Evaluation 11/02/21   09/18/20, PN 38/8/87, recert 5/79/72  ? Authorization Type MC review after 25 visits   ? PT Start Time 1100   ? PT Stop Time 1210   ? PT Time Calculation (min) 70 min   ? Activity Tolerance Patient tolerated treatment well;No increased pain   ? Behavior During Therapy [facility] for tasks assessed/performed   ? ?  ?  ? ?  ? ? ?Past Medical History:  ?Diagnosis Date  ? Aortic valve disorders   ? Arthritis   ? Atrial flutter ([place]) 06/18/2016  ? "AF or AFl; not sure which" (06/23/2016)  ? Basal cell carcinoma   ? "face, nose left shoulder, left arm" (06/19/2016)  ? Basal cell carcinoma 09/13/2020  ? right temple  ? BBB (bundle branch block)   ? hx right  ? Chronic back pain   ? "neck, thoracic, lower back" (06/19/2016)  ? Complete heart block ([place]) 06/2016  ? Dyspnea   ? GERD (gastroesophageal reflux disease)   ? Gout   ? Heart block   ? "I've had type I, II [place] before now" (06/19/2016)  ? History of gout   ? History of hiatal hernia   ? "self dx'd" (06/19/2016)  ? Hyperlipidemia   ? Hypertension   ? Lyme disease   ? "dx'd by me 2003; cx's showed dx 08/2015"  ? Migraine   ? "3-4/year" (06/19/2016)  ? Presence of permanent cardiac pacemaker 06/19/2016  ? PVC's (premature ventricular contractions)   ? Renal cancer, left ([place]) 2006  ? S/P cryotherapy  ? Spinal stenosis   ? "cervical, 1 thoracic, lumbar" (06/19/2016)  ? Squamous carcinoma   ? "face, nose left shoulder, left arm" (06/19/2016)  ? Stroke [facility])   ? TIA (transient ischemic attack) 06/14/2016  ? "I'm not sure that's what it was" (06/25/2016)   ? Visit for monitoring Tikosyn therapy 09/09/2017  ? ? ?Past Surgical History:  ?Procedure Laterality Date  ? [place]  ? BACK SURGERY  05/07/2020  ? BASAL CELL CARCINOMA EXCISION    ? "face, nose left shoulder, left arm"  ? BIOPSY PROSTATE  2001 & 2003  ? CARDIAC CATHETERIZATION  1990's  ? CARDIOVERSION N/A 09/11/2017  ? Procedure: CARDIOVERSION;  Surgeon: Lelon Perla, MD;  Location: [facility] ENDOSCOPY;  Service: Cardiovascular;  Laterality: N/A;  ? FRACTURE SURGERY    ? HOLEP-LASER ENUCLEATION OF THE PROSTATE WITH MORCELLATION N/A 07/10/2020  ? Procedure: HOLEP-LASER ENUCLEATION OF THE PROSTATE WITH MORCELLATION;  Surgeon: Hollice Espy, MD;  Location: [facility] ORS;  Service: Urology;  Laterality: N/A;  ? INGUINAL HERNIA REPAIR Left 2012  ? INSERT / REPLACE / REMOVE PACEMAKER  06/19/2016  ? LAPAROSCOPIC ABLATION RENAL MASS    ? LEFT HEART CATH AND CORONARY ANGIOGRAPHY Left 10/23/2017  ? Procedure: LEFT HEART CATH AND CORONARY ANGIOGRAPHY;  Surgeon: Wellington Hampshire, MD;  Location: [facility] CV LAB;  Service: Cardiovascular;  Laterality: Left;  ? LEFT HEART CATH AND CORONARY ANGIOGRAPHY Left 02/26/2021  ? Procedure: LEFT HEART CATH AND CORONARY ANGIOGRAPHY;  Surgeon: Wellington Hampshire, MD;  Location: [facility];  Service: Cardiovascular;  Laterality: Left;  ? PACEMAKER IMPLANT N/A 06/19/2016  ? Procedure: Pacemaker Implant;  Surgeon: Deboraha Sprang, MD;  Location: [facility];  Service: Cardiovascular;  Laterality: N/A;  ? pacemasker    ? PROSTATE SURGERY    ? SQUAMOUS CELL CARCINOMA EXCISION    ? "face, nose left shoulder, left arm"  ? TONSILLECTOMY AND ADENOIDECTOMY    ? ? ?There were no vitals filed for this visit. ? ? Subjective Assessment - 08/24/21 1106   ? ? Subjective Pt feels he is able to sense his rectal and urethra sphincters at a point of being able to contract them for a bit of time 20% of what he feels he should be able to to do. There is leakage when he is  moving around.   ? Pertinent History Lumbar imagining 05/09/19   "L2 compression deformity is again identified. L1 and L3 pedicle  screws are seen with posterior fixation identified. The overall  appearance is stable from the intraoperative exam. Mild degenerative  anterolisthesis of L4 on L5 is seen. Intraoperative spot films of the lumbar spine demonstrate initially  2 posterior spinal needles marking the T12 and L3 spinous processes."   HOLEP 07/2020   ? ?  ?  ? ?  ? ? ? ? ? [facility] PT Assessment - 08/24/21 1309   ? ?  ? Coordination  ? Coordination and Movement Description chest breathing, dycoordination of pelvic floor   ?  ? Palpation  ? Palpation comment lateral and anterior excursion of diaphragm   ? ?  ?  ? ?  ? ? ? ? ? ? ? ? ? ? ? ? ? Pelvic Floor Special Questions - 08/24/21 1309   ? ? Pelvic Floor Internal Exam pt consented verball without contraindications   ? Exam Type Rectal   ? Palpation no tightness at pelvic floor, proper contraction   ? Strength # of reps 4   hooklying, pillow under head  ? Strength # of seconds 5   ? ?  ?  ? ?  ? ? ? ? [facility] Adult PT Treatment/Exercise - 08/24/21 1311   ? ?  ? Neuro Re-ed   ? Neuro Re-ed Details  cued for less chest breathing, long contractions with rest breaths for optimal lengthening   ?  ? Manual Therapy  ? Manual therapy comments assessed pelvic floor   ? ?  ?  ? ?  ? ? ? ? ? ? ? ? ? ? ? ? ? ? ? PT Long Term Goals - 08/24/21 1314   ? ?  ? PT LONG TERM GOAL #1  ? Title Pt will complete a chart detailing the method he uses for continence in relationship with activities across each day of the week in order to gather data for baseline   ? Time 2   ? Period Weeks   ? Status Achieved   ?  ? PT LONG TERM GOAL #2  ? Title Pt will improve gait mechanics and posture  to increase walking endurance from 20 min to > 30 min in order to walk with less pain   ? Time 6   ? Period Weeks   ? Status Achieved   ?  ? PT LONG TERM GOAL #3  ? Title Pt will demo less forward head posture  from 30 cm from earlobe to wall to < 25 cm  in order to achieve more upright posture to optimize IAP system for postural stability ( less LBP) and urinary continence 12/19/20: 26 cm  03/05/21: 24 cm , 05/14/21: 25 cm)  )   ? Time 8   ? Period Weeks   ? Status Achieved   ?  ? PT LONG TERM GOAL #4  ? Title Pt will demo IND with deep core coordination and pelvic floor coordination without compensatory patterns to help with ADLs   ? Time 4   ? Period Weeks   ? Status Achieved   ?  ? PT LONG TERM GOAL #5  ? Title Pt will demo less L thoracic shift, less convex curve at T/L junction and more reciprocal gait pattern in order to regain structural midline and to progress to pelvic floor contractions with better outcomes   ? Time 10   ? Period Weeks   ? Status Achieved   ?  ? PT LONG TERM GOAL #6  ? Title Pt will report decreased pain by 50% with Bending over, getting out of bed, reaching up overhead activities in order to perform his hobbies and household chores.   ? Time 8   ? Period Weeks   ? Status Achieved   ?  ? PT LONG TERM GOAL #7  ? Title Pt will demo proper deep core coordination, pelvic floor contractions 3 sec, 3 reps in order to to minimize leakage while gardening   ? Baseline chest breathing ( limited anterior diaphragmatic breathing)   ? Time 8   ? Period Weeks   ? Status Achieved   ?  ? PT LONG TERM GOAL #8  ? Title Pt will report getting the sensation of fullness of bladder and being able to empty completely across 2 weeks (05/14/21: still straining, 05/28/21: straining less but still has decreaased awareness of full bladder, 07/09/21: feeling of fullnes of bladder but straining to get the last 50% of the urine  )   ? Time 6  ? Period Weeks   ? Status Partially Met   ? Target Date 10/05/21   ?  ? PT LONG TERM GOAL  #9  ? TITLE Pt will demo increased hip abduction on L from 3+/5 to > 4/5 in order to improve pelvic girdle stability   ? Time 410  ? Period Weeks   ? Status On-going   ? Target Date 11/02/21   ? ?  ?  ? ?   ? ? ? ? ? ? ? ? Plan - 08/24/21 1309   ? ? Clinical Impression Statement Pt achieved 7/9 goals and progressing well towards remaining goals. Pt's sensation of bladder fullness and LBP have improved. Pt's po

## 2021-08-25 ENCOUNTER — Other Ambulatory Visit: Payer: Self-pay | Admitting: Internal Medicine

## 2021-08-30 ENCOUNTER — Ambulatory Visit: Payer: Medicare Other | Admitting: Physical Therapy

## 2021-08-30 DIAGNOSIS — R2689 Other abnormalities of gait and mobility: Secondary | ICD-10-CM | POA: Diagnosis not present

## 2021-08-30 DIAGNOSIS — M4125 Other idiopathic scoliosis, thoracolumbar region: Secondary | ICD-10-CM

## 2021-08-30 DIAGNOSIS — G8929 Other chronic pain: Secondary | ICD-10-CM | POA: Diagnosis not present

## 2021-08-30 DIAGNOSIS — R293 Abnormal posture: Secondary | ICD-10-CM | POA: Diagnosis not present

## 2021-08-30 DIAGNOSIS — M533 Sacrococcygeal disorders, not elsewhere classified: Secondary | ICD-10-CM | POA: Diagnosis not present

## 2021-08-30 DIAGNOSIS — M545 Low back pain, unspecified: Secondary | ICD-10-CM | POA: Diagnosis not present

## 2021-08-30 DIAGNOSIS — N39498 Other specified urinary incontinence: Secondary | ICD-10-CM

## 2021-08-31 NOTE — Patient Instructions (Signed)
? ?  Feet slides : ?  ?Points of contact at sitting bones  ?Four points of contact of foot,  ?Side knee back while keeping knee out along 2-3rd toe line  ? ?Heel up, ankle not twist out ?Lower heel while keeping knee out along 2-3rd toe line ?Four points of contact of foot, ?Slide foot back while keeping knee out along 2-3rd toe line ?  ?Repeated with other foot  ? ___ ? ?Standing with more weight bearing across [place], lateral expansion of ribs when urinating to initiate flow  ?

## 2021-08-31 NOTE — Therapy (Signed)
[address] ?[facility] ?[place], [place], [address] ?Phone: [phone]   Fax:  [phone] ? ?Physical Therapy Treatment ? ?Patient Details  ?Name: Thomas [name] ?MRN: [id number] ?Date of Birth: 25-Sep-1932 ?Referring Provider (PT): Dawley and Erlene Quan ? ? ?Encounter Date: 08/30/2021 ? ? PT End of Session - 08/31/21 1219   ? ? Visit Number 24   ? Date for PT Re-Evaluation 11/02/21   09/18/20, PN 81/1/57, recert 2/62/03  ? Authorization Type MC review after 25 visits   ? PT Start Time 1100   ? PT Stop Time 1203   ? PT Time Calculation (min) 63 min   ? Activity Tolerance Patient tolerated treatment well;No increased pain   ? Behavior During Therapy [facility] for tasks assessed/performed   ? ?  ?  ? ?  ? ? ?Past Medical History:  ?Diagnosis Date  ? Aortic valve disorders   ? Arthritis   ? Atrial flutter ([place]) 06/18/2016  ? "AF or AFl; not sure which" (06/23/2016)  ? Basal cell carcinoma   ? "face, nose left shoulder, left arm" (06/19/2016)  ? Basal cell carcinoma 09/13/2020  ? right temple  ? BBB (bundle branch block)   ? hx right  ? Chronic back pain   ? "neck, thoracic, lower back" (06/19/2016)  ? Complete heart block ([place]) 06/2016  ? Dyspnea   ? GERD (gastroesophageal reflux disease)   ? Gout   ? Heart block   ? "I've had type I, II Wenke before now" (06/19/2016)  ? History of gout   ? History of hiatal hernia   ? "self dx'd" (06/19/2016)  ? Hyperlipidemia   ? Hypertension   ? Lyme disease   ? "dx'd by me 2003; cx's showed dx 08/2015"  ? Migraine   ? "3-4/year" (06/19/2016)  ? Presence of permanent cardiac pacemaker 06/19/2016  ? PVC's (premature ventricular contractions)   ? Renal cancer, left ([place]) 2006  ? S/P cryotherapy  ? Spinal stenosis   ? "cervical, 1 thoracic, lumbar" (06/19/2016)  ? Squamous carcinoma   ? "face, nose left shoulder, left arm" (06/19/2016)  ? Stroke [facility])   ? TIA (transient ischemic attack) 06/14/2016  ? "I'm not sure that's what it was" (06/25/2016)   ? Visit for monitoring Tikosyn therapy 09/09/2017  ? ? ?Past Surgical History:  ?Procedure Laterality Date  ? [place]  ? BACK SURGERY  05/07/2020  ? BASAL CELL CARCINOMA EXCISION    ? "face, nose left shoulder, left arm"  ? BIOPSY PROSTATE  2001 & 2003  ? CARDIAC CATHETERIZATION  1990's  ? CARDIOVERSION N/A 09/11/2017  ? Procedure: CARDIOVERSION;  Surgeon: Lelon Perla, MD;  Location: [facility] ENDOSCOPY;  Service: Cardiovascular;  Laterality: N/A;  ? FRACTURE SURGERY    ? HOLEP-LASER ENUCLEATION OF THE PROSTATE WITH MORCELLATION N/A 07/10/2020  ? Procedure: HOLEP-LASER ENUCLEATION OF THE PROSTATE WITH MORCELLATION;  Surgeon: Hollice Espy, MD;  Location: [facility] ORS;  Service: Urology;  Laterality: N/A;  ? INGUINAL HERNIA REPAIR Left 2012  ? INSERT / REPLACE / REMOVE PACEMAKER  06/19/2016  ? LAPAROSCOPIC ABLATION RENAL MASS    ? LEFT HEART CATH AND CORONARY ANGIOGRAPHY Left 10/23/2017  ? Procedure: LEFT HEART CATH AND CORONARY ANGIOGRAPHY;  Surgeon: Wellington Hampshire, MD;  Location: [place] CV LAB;  Service: Cardiovascular;  Laterality: Left;  ? LEFT HEART CATH AND CORONARY ANGIOGRAPHY Left 02/26/2021  ? Procedure: LEFT HEART CATH AND CORONARY ANGIOGRAPHY;  Surgeon: Wellington Hampshire, MD;  Location: [place] CV LAB;  Service: Cardiovascular;  Laterality: Left;  ? PACEMAKER IMPLANT N/A 06/19/2016  ? Procedure: Pacemaker Implant;  Surgeon: Deboraha Sprang, MD;  Location: [place] CV LAB;  Service: Cardiovascular;  Laterality: N/A;  ? pacemasker    ? PROSTATE SURGERY    ? SQUAMOUS CELL CARCINOMA EXCISION    ? "face, nose left shoulder, left arm"  ? TONSILLECTOMY AND ADENOIDECTOMY    ? ? ?There were no vitals filed for this visit. ? ? Subjective Assessment - 08/30/21 1108   ? ? Subjective Pt noticed the sphincter moving with the exercise after last session. Pt noticed he is drier walking around for a while and only experiences drips.   ? Pertinent History Lumbar imagining 05/09/19   "L2  compression deformity is again identified. L1 and L3 pedicle  screws are seen with posterior fixation identified. The overall  appearance is stable from the intraoperative exam. Mild degenerative  anterolisthesis of L4 on L5 is seen. Intraoperative spot films of the lumbar spine demonstrate initially  2 posterior spinal needles marking the T12 and L3 spinous processes."   HOLEP 07/2020   ? ?  ?  ? ?  ? ? ? ? ? [facility] PT Assessment - 08/31/21 1221   ? ?  ? Coordination  ? Coordination and Movement Description standing and sitting : deep core breathing less posterior tilt of pelvis, but presented with posterior COM , overuse of oblique   ?  ? Palpation  ? Palpation comment hypomobile L midfoot , limited DF/EV leading to posterior COM   ? ?  ?  ? ?  ? ? ? ? ? ? ? ? ? ? ? ? ? Pelvic Floor Special Questions - 08/31/21 1220   ? ? External Palpation standing: activation of pelvic floor at pubic area but stronger activation with cue for more WBing across transverse arch of feet , more anterior COM   ? ?  ?  ? ?  ? ? ? ? [place] Adult PT Treatment/Exercise - 08/31/21 1221   ? ?  ? Therapeutic Activites   ? Other Therapeutic Activities simulated standing urination standing position with practice of deep core and more propioception into feet   ?  ? Neuro Re-ed   ? Neuro Re-ed Details  excessive tactile and verbal cues for deep core in against gravity positions to elicit TrA and less oblique overuse   ?  ? Manual Therapy  ? Manual therapy comments PA/AP mob Grade III, STM/MWM at L midfoot to promote less supination and inversion   ? ?  ?  ? ?  ? ? ? ? ? ? ? ? ? ? ? ? ? ? ? PT Long Term Goals - 08/24/21 1314   ? ?  ? PT LONG TERM GOAL #1  ? Title Pt will complete a chart detailing the method he uses for continence in relationship with activities across each day of the week in order to gather data for baseline   ? Time 2   ? Period Weeks   ? Status Achieved   ?  ? PT LONG TERM GOAL #2  ? Title Pt will improve gait mechanics and posture   to increase walking endurance from 20 min to > 30 min in order to walk with less pain   ? Time 6   ? Period Weeks   ? Status Achieved   ?  ? PT LONG TERM  GOAL #3  ? Title Pt will demo less forward head posture from 30 cm from earlobe to wall to < 25 cm in order to achieve more upright posture to optimize IAP system for postural stability ( less LBP) and urinary continence 12/19/20: 26 cm  03/05/21: 24 cm , 05/14/21: 25 cm)  )   ? Time 8   ? Period Weeks   ? Status Achieved   ?  ? PT LONG TERM GOAL #4  ? Title Pt will demo IND with deep core coordination and pelvic floor coordination without compensatory patterns to help with ADLs   ? Time 4   ? Period Weeks   ? Status Achieved   ?  ? PT LONG TERM GOAL #5  ? Title Pt will demo less L thoracic shift, less convex curve at T/L junction and more reciprocal gait pattern in order to regain structural midline and to progress to pelvic floor contractions with better outcomes   ? Time 10   ? Period Weeks   ? Status Achieved   ?  ? PT LONG TERM GOAL #6  ? Title Pt will report decreased pain by 50% with Bending over, getting out of bed, reaching up overhead activities in order to perform his hobbies and household chores.   ? Time 8   ? Period Weeks   ? Status Achieved   ?  ? PT LONG TERM GOAL #7  ? Title Pt will demo proper deep core coordination, pelvic floor contractions 3 sec, 3 reps in order to to minimize leakage while gardening   ? Baseline chest breathing ( limited anterior diaphragmatic breathing)   ? Time 8   ? Period Weeks   ? Status Achieved   ?  ? PT LONG TERM GOAL #8  ? Title Pt will report getting the sensation of fullness of bladder and being able to empty completely across 2 weeks (05/14/21: still straining, 05/28/21: straining less but still has decreaased awareness of full bladder, 07/09/21: feeling of fullnes of bladder but straining to get the last 50% of the urine  )   ? Time 6   ? Period Weeks   ? Status Partially Met   ? Target Date 10/05/21   ?  ? PT LONG TERM  GOAL  #9  ? TITLE Pt will demo increased hip abduction on L from 3+/5 to > 4/5 in order to improve pelvic girdle stability   ? Time 10   ? Period Weeks   ? Status On-going   ? Target Date 11/02/21   ? ?

## 2021-09-03 ENCOUNTER — Ambulatory Visit: Payer: Medicare Other | Attending: Urology | Admitting: Physical Therapy

## 2021-09-03 DIAGNOSIS — M545 Low back pain, unspecified: Secondary | ICD-10-CM | POA: Diagnosis not present

## 2021-09-03 DIAGNOSIS — G8929 Other chronic pain: Secondary | ICD-10-CM | POA: Diagnosis not present

## 2021-09-03 DIAGNOSIS — M4125 Other idiopathic scoliosis, thoracolumbar region: Secondary | ICD-10-CM | POA: Insufficient documentation

## 2021-09-03 DIAGNOSIS — M533 Sacrococcygeal disorders, not elsewhere classified: Secondary | ICD-10-CM | POA: Insufficient documentation

## 2021-09-03 DIAGNOSIS — R2689 Other abnormalities of gait and mobility: Secondary | ICD-10-CM | POA: Insufficient documentation

## 2021-09-03 DIAGNOSIS — R293 Abnormal posture: Secondary | ICD-10-CM | POA: Diagnosis not present

## 2021-09-03 DIAGNOSIS — N39498 Other specified urinary incontinence: Secondary | ICD-10-CM | POA: Insufficient documentation

## 2021-09-03 NOTE — Patient Instructions (Addendum)
Hip flexor stretch with L foot propped on seat of chair, ?L hand on shoulder , chest lifts, look up as you lean forward on L foot  ? ?10 reps and switch ? ?___ ? ?Clam shells propped on forearm ( shoulder behind elbow line, forearm at 45 deg )  ?20 reps  ?Each side  ? ?___ ? ?Seated quick contractions 5 reps with lengthening in between  ? With 3 meals  ( 3 x day)   ?

## 2021-09-04 NOTE — Therapy (Signed)
[place] ?[facility] [facility] ?[place], [place], [address] ?Phone: [phone]   Fax:  [phone] ? ?Physical Therapy Treatment ? ?Patient Details  ?Name: Thomas [name] ?MRN: [id number] ?Date of Birth: 1932/09/13 ?Referring Provider (PT): Dawley and Erlene Quan ? ? ?Encounter Date: 09/03/2021 ? ? PT End of Session - 09/03/21 1414   ? ? Visit Number 25   ? Date for PT Re-Evaluation 11/02/21   09/18/20, PN 41/6/38, recert 4/53/64  ? Authorization Type MC review after 25 visits   ? PT Start Time 1407   ? PT Stop Time 1500   ? PT Time Calculation (min) 53 min   ? Activity Tolerance Patient tolerated treatment well;No increased pain   ? Behavior During Therapy [facility] for tasks assessed/performed   ? ?  ?  ? ?  ? ? ?Past Medical History:  ?Diagnosis Date  ? Aortic valve disorders   ? Arthritis   ? Atrial flutter ([place]) 06/18/2016  ? "AF or AFl; not sure which" (06/23/2016)  ? Basal cell carcinoma   ? "face, nose left shoulder, left arm" (06/19/2016)  ? Basal cell carcinoma 09/13/2020  ? right temple  ? BBB (bundle branch block)   ? hx right  ? Chronic back pain   ? "neck, thoracic, lower back" (06/19/2016)  ? Complete heart block ([place]) 06/2016  ? Dyspnea   ? GERD (gastroesophageal reflux disease)   ? Gout   ? Heart block   ? "I've had type I, II Wenke before now" (06/19/2016)  ? History of gout   ? History of hiatal hernia   ? "self dx'd" (06/19/2016)  ? Hyperlipidemia   ? Hypertension   ? Lyme disease   ? "dx'd by me 2003; cx's showed dx 08/2015"  ? Migraine   ? "3-4/year" (06/19/2016)  ? Presence of permanent cardiac pacemaker 06/19/2016  ? PVC's (premature ventricular contractions)   ? Renal cancer, left ([place]) 2006  ? S/P cryotherapy  ? Spinal stenosis   ? "cervical, 1 thoracic, lumbar" (06/19/2016)  ? Squamous carcinoma   ? "face, nose left shoulder, left arm" (06/19/2016)  ? Stroke [facility])   ? TIA (transient ischemic attack) 06/14/2016  ? "I'm not sure that's what it was" (06/25/2016)  ?  Visit for monitoring Tikosyn therapy 09/09/2017  ? ? ?Past Surgical History:  ?Procedure Laterality Date  ? [place]  ? BACK SURGERY  05/07/2020  ? BASAL CELL CARCINOMA EXCISION    ? "face, nose left shoulder, left arm"  ? BIOPSY PROSTATE  2001 & 2003  ? CARDIAC CATHETERIZATION  1990's  ? CARDIOVERSION N/A 09/11/2017  ? Procedure: CARDIOVERSION;  Surgeon: Lelon Perla, MD;  Location: [facility] ENDOSCOPY;  Service: Cardiovascular;  Laterality: N/A;  ? FRACTURE SURGERY    ? HOLEP-LASER ENUCLEATION OF THE PROSTATE WITH MORCELLATION N/A 07/10/2020  ? Procedure: HOLEP-LASER ENUCLEATION OF THE PROSTATE WITH MORCELLATION;  Surgeon: Hollice Espy, MD;  Location: [facility] ORS;  Service: Urology;  Laterality: N/A;  ? INGUINAL HERNIA REPAIR Left 2012  ? INSERT / REPLACE / REMOVE PACEMAKER  06/19/2016  ? LAPAROSCOPIC ABLATION RENAL MASS    ? LEFT HEART CATH AND CORONARY ANGIOGRAPHY Left 10/23/2017  ? Procedure: LEFT HEART CATH AND CORONARY ANGIOGRAPHY;  Surgeon: Wellington Hampshire, MD;  Location: [place] CV LAB;  Service: Cardiovascular;  Laterality: Left;  ? LEFT HEART CATH AND CORONARY ANGIOGRAPHY Left 02/26/2021  ? Procedure: LEFT HEART CATH AND CORONARY ANGIOGRAPHY;  Surgeon: Wellington Hampshire, MD;  Location: [place] CV LAB;  Service: Cardiovascular;  Laterality: Left;  ? PACEMAKER IMPLANT N/A 06/19/2016  ? Procedure: Pacemaker Implant;  Surgeon: Deboraha Sprang, MD;  Location: [place] CV LAB;  Service: Cardiovascular;  Laterality: N/A;  ? pacemasker    ? PROSTATE SURGERY    ? SQUAMOUS CELL CARCINOMA EXCISION    ? "face, nose left shoulder, left arm"  ? TONSILLECTOMY AND ADENOIDECTOMY    ? ? ?There were no vitals filed for this visit. ? ? Subjective Assessment - 09/03/21 1415   ? ? Subjective Pt noticed he is able to control leakage in between trips to the bathroom 20% better. Pt still has leakage with activities and some leakage without activities ( sitting ).   ? Pertinent History Lumbar  imagining 05/09/19   "L2 compression deformity is again identified. L1 and L3 pedicle  screws are seen with posterior fixation identified. The overall  appearance is stable from the intraoperative exam. Mild degenerative  anterolisthesis of L4 on L5 is seen. Intraoperative spot films of the lumbar spine demonstrate initially  2 posterior spinal needles marking the T12 and L3 spinous processes."   HOLEP 07/2020   ? ?  ?  ? ?  ? ? ? ? ? [facility] PT Assessment - 09/04/21 1456   ? ?  ? Strength  ? Overall Strength Comments B hip abd 3/5   ? ?  ?  ? ?  ? ? ? ? ? ? ? ? ? ? ? ? ? Pelvic Floor Special Questions - 09/04/21 1456   ? ? External Palpation seated 5 quick reps with proper coordination   ? ?  ?  ? ?  ? ? ? ? [facility] Adult PT Treatment/Exercise - 09/04/21 1439   ? ?  ? Therapeutic Activites   ? Other Therapeutic Activities reassessed goals and reviewed standing posture with deep core coordination optimal urinary mechanics   ?  ? Neuro Re-ed   ? Neuro Re-ed Details  cued for seated pelvic floor contraction and schedule to not overtrain mm   ? ?  ?  ? ?  ? ? ? ? ? ? ? ? ? ? ? ? ? ? ? PT Long Term Goals - 09/03/21 1421   ? ?  ? PT LONG TERM GOAL #1  ? Title Pt will complete a chart detailing the method he uses for continence in relationship with activities across each day of the week in order to gather data for baseline   ? Time 2   ? Period Weeks   ? Status Achieved   ?  ? PT LONG TERM GOAL #2  ? Title Pt will improve gait mechanics and posture  to increase walking endurance from 20 min to > 30 min in order to walk with less pain   ? Time 6   ? Period Weeks   ? Status Achieved   ?  ? PT LONG TERM GOAL #3  ? Title Pt will demo less forward head posture from 30 cm from earlobe to wall to < 25 cm in order to achieve more upright posture to optimize IAP system for postural stability ( less LBP) and urinary continence 12/19/20: 26 cm  03/05/21: 24 cm , 05/14/21: 25 cm)  )   ? Time 8   ? Period Weeks   ? Status Achieved   ?  ? PT LONG  TERM GOAL #4  ? Title Pt will  demo IND with deep core coordination and pelvic floor coordination without compensatory patterns to help with ADLs   ? Time 4   ? Period Weeks   ? Status Achieved   ?  ? PT LONG TERM GOAL #5  ? Title Pt will demo less L thoracic shift, less convex curve at T/L junction and more reciprocal gait pattern in order to regain structural midline and to progress to pelvic floor contractions with better outcomes   ? Time 10   ? Period Weeks   ? Status Achieved   ?  ? Additional Long Term Goals  ? Additional Long Term Goals Yes   ?  ? PT LONG TERM GOAL #6  ? Title Pt will report decreased pain by 50% with Bending over, getting out of bed, reaching up overhead activities in order to perform his hobbies and household chores.   ? Time 8   ? Period Weeks   ? Status Achieved   ?  ? PT LONG TERM GOAL #7  ? Title Pt will demo proper deep core coordination, pelvic floor contractions 3 sec, 3 reps in order to to minimize leakage while gardening   ? Baseline chest breathing ( limited anterior diaphragmatic breathing)   ? Time 8   ? Period Weeks   ? Status Achieved   ?  ? PT LONG TERM GOAL #8  ? Title Pt will report getting the sensation of fullness of bladder and being able to empty completely across 2 weeks (05/14/21: still straining, 05/28/21: straining less but still has decreaased awareness of full bladder, 07/09/21: feeling of fullnes of bladder but straining to get the last 50% of the urine, 09/03/2021: straining to get 50% of the urine out  )   ? Time 6   ? Period Weeks   ? Status Partially Met   ? Target Date 10/05/21   ?  ? PT LONG TERM GOAL  #9  ? TITLE Pt will demo increased hip abduction on L from 3+/5 to > 4/5 in order to improve pelvic girdle stability   ? Time 10   ? Period Weeks   ? Status On-going   ? Target Date 11/02/21   ?  ? PT LONG TERM GOAL  #10  ? TITLE Pt will demo proper coordination of pelvic floor and deep core mm in standing posture to urinate without straining   ? Baseline  dyscoordination( pushing downward )   ? Time 8   ? Period Weeks   ? Status New   ? Target Date 10/29/21   ?  ? PT LONG TERM GOAL  #11  ? TITLE Pt will report decreased leakage by 50% ( droplet wetness in pad) when

## 2021-09-06 ENCOUNTER — Encounter: Payer: Medicare Other | Admitting: Physical Therapy

## 2021-09-07 ENCOUNTER — Encounter: Payer: Medicare Other | Admitting: Physical Therapy

## 2021-09-18 ENCOUNTER — Ambulatory Visit: Payer: Medicare Other | Admitting: Physical Therapy

## 2021-09-18 DIAGNOSIS — G8929 Other chronic pain: Secondary | ICD-10-CM

## 2021-09-18 DIAGNOSIS — M533 Sacrococcygeal disorders, not elsewhere classified: Secondary | ICD-10-CM

## 2021-09-18 DIAGNOSIS — M545 Low back pain, unspecified: Secondary | ICD-10-CM | POA: Diagnosis not present

## 2021-09-18 DIAGNOSIS — R2689 Other abnormalities of gait and mobility: Secondary | ICD-10-CM | POA: Diagnosis not present

## 2021-09-18 DIAGNOSIS — M4125 Other idiopathic scoliosis, thoracolumbar region: Secondary | ICD-10-CM

## 2021-09-18 DIAGNOSIS — N39498 Other specified urinary incontinence: Secondary | ICD-10-CM

## 2021-09-18 DIAGNOSIS — R293 Abnormal posture: Secondary | ICD-10-CM | POA: Diagnosis not present

## 2021-09-18 NOTE — Therapy (Signed)
[place] ?[facility] ?[address] ?[place], [place], [address] ?Phone: [phone]   Fax:  [phone] ? ?Physical Therapy Treatment ? ?Patient Details  ?Name: Thomas [name] ?MRN: [id number] ?Date of Birth: 1933-01-23 ?Referring Provider (PT): Dawley and Apolinar Junes ? ? ?Encounter Date: 09/18/2021 ? ? PT End of Session - 09/18/21 1909   ? ? Visit Number [id number]   ? Date for PT Re-Evaluation 11/02/21   09/18/20, PN 02/05/21, recert 08/24/21  ? Authorization Type MC review after 25 visits   ? PT Start Time 1405   ? PT Stop Time 1500   ? PT Time Calculation (min) 55 min   ? Activity Tolerance Patient tolerated treatment well;No increased pain   ? Behavior During Therapy [facility] for tasks assessed/performed   ? ?  ?  ? ?  ? ? ?Past Medical History:  ?Diagnosis Date  ? Aortic valve disorders   ? Arthritis   ? Atrial flutter (HCC) 06/18/2016  ? "AF or AFl; not sure which" (06/23/2016)  ? Basal cell carcinoma   ? "face, nose left shoulder, left arm" (06/19/2016)  ? Basal cell carcinoma 09/13/2020  ? right temple  ? BBB (bundle branch block)   ? hx right  ? Chronic back pain   ? "neck, thoracic, lower back" (06/19/2016)  ? Complete heart block (HCC) 06/2016  ? Dyspnea   ? GERD (gastroesophageal reflux disease)   ? Gout   ? Heart block   ? "I've had type I, II Wenke before now" (06/19/2016)  ? History of gout   ? History of hiatal hernia   ? "self dx'd" (06/19/2016)  ? Hyperlipidemia   ? Hypertension   ? Lyme disease   ? "dx'd by me 2003; cx's showed dx 08/2015"  ? Migraine   ? "3-4/year" (06/19/2016)  ? Presence of permanent cardiac pacemaker 06/19/2016  ? PVC's (premature ventricular contractions)   ? Renal cancer, left (HCC) 2006  ? S/P cryotherapy  ? Spinal stenosis   ? "cervical, 1 thoracic, lumbar" (06/19/2016)  ? Squamous carcinoma   ? "face, nose left shoulder, left arm" (06/19/2016)  ? Stroke [facility])   ? TIA (transient ischemic attack) 06/14/2016  ? "I'm not sure that's what it was" (06/25/2016)   ? Visit for monitoring Tikosyn therapy 09/09/2017  ? ? ?Past Surgical History:  ?Procedure Laterality Date  ? ANKLE FRACTURE SURGERY Right 1967  ? BACK SURGERY  05/07/2020  ? BASAL CELL CARCINOMA EXCISION    ? "face, nose left shoulder, left arm"  ? BIOPSY PROSTATE  2001 & 2003  ? CARDIAC CATHETERIZATION  1990's  ? CARDIOVERSION N/A 09/11/2017  ? Procedure: CARDIOVERSION;  Surgeon: Lewayne Bunting, MD;  Location: [facility];  Service: Cardiovascular;  Laterality: N/A;  ? FRACTURE SURGERY    ? HOLEP-LASER ENUCLEATION OF THE PROSTATE WITH MORCELLATION N/A 07/10/2020  ? Procedure: HOLEP-LASER ENUCLEATION OF THE PROSTATE WITH MORCELLATION;  Surgeon: Vanna Scotland, MD;  Location: [facility] ORS;  Service: Urology;  Laterality: N/A;  ? INGUINAL HERNIA REPAIR Left 2012  ? INSERT / REPLACE / REMOVE PACEMAKER  06/19/2016  ? LAPAROSCOPIC ABLATION RENAL MASS    ? LEFT HEART CATH AND CORONARY ANGIOGRAPHY Left 10/23/2017  ? Procedure: LEFT HEART CATH AND CORONARY ANGIOGRAPHY;  Surgeon: Iran Ouch, MD;  Location: [facility] INVASIVE CV LAB;  Service: Cardiovascular;  Laterality: Left;  ? LEFT HEART CATH AND CORONARY ANGIOGRAPHY Left 02/26/2021  ? Procedure: LEFT HEART CATH AND CORONARY ANGIOGRAPHY;  Surgeon: Wellington Hampshire, MD;  Location: [facility];  Service: Cardiovascular;  Laterality: Left;  ? PACEMAKER IMPLANT N/A 06/19/2016  ? Procedure: Pacemaker Implant;  Surgeon: Deboraha Sprang, MD;  Location: [facility];  Service: Cardiovascular;  Laterality: N/A;  ? pacemasker    ? PROSTATE SURGERY    ? SQUAMOUS CELL CARCINOMA EXCISION    ? "face, nose left shoulder, left arm"  ? TONSILLECTOMY AND ADENOIDECTOMY    ? ? ?There were no vitals filed for this visit. ? ? Subjective Assessment - 09/18/21 1426   ? ? Subjective Pt noticed the sphincter exercises from last session is helping him feel that his sphincter is gettting stronger. Pt is emptying 55-65% of the time without straining.   ? Pertinent History Lumbar  imagining 05/09/19   "L2 compression deformity is again identified. L1 and L3 pedicle  screws are seen with posterior fixation identified. The overall  appearance is stable from the intraoperative exam. Mild degenerative  anterolisthesis of L4 on L5 is seen. Intraoperative spot films of the lumbar spine demonstrate initially  2 posterior spinal needles marking the T12 and L3 spinous processes."   HOLEP 07/2020   ? ?  ?  ? ?  ? ? ? ? ? ? ? ? ? ? ? ? ? ? ? ? ? Pelvic Floor Special Questions - 09/18/21 1909   ? ? External Palpation hookyling:  5 sec hold, 3 reps with no cues required   ? ?  ?  ? ?  ? ? ? ? [place] Adult PT Treatment/Exercise - 09/18/21 1910   ? ?  ? Therapeutic Activites   ? Other Therapeutic Activities explained the regimen of pelvic floor long holds for slow twitch fibers of pelvic floor   ?  ? Neuro Re-ed   ? Neuro Re-ed Details  tactile cues for pelvic floor long holds   ? ?  ?  ? ?  ? ? ? ? ? ? ? ? ? ? ? ? ? ? ? PT Long Term Goals - 09/03/21 1421   ? ?  ? PT LONG TERM GOAL #1  ? Title Pt will complete a chart detailing the method he uses for continence in relationship with activities across each day of the week in order to gather data for baseline   ? Time 2   ? Period Weeks   ? Status Achieved   ?  ? PT LONG TERM GOAL #2  ? Title Pt will improve gait mechanics and posture  to increase walking endurance from 20 min to > 30 min in order to walk with less pain   ? Time 6   ? Period Weeks   ? Status Achieved   ?  ? PT LONG TERM GOAL #3  ? Title Pt will demo less forward head posture from 30 cm from earlobe to wall to < 25 cm in order to achieve more upright posture to optimize IAP system for postural stability ( less LBP) and urinary continence 12/19/20: 26 cm  03/05/21: 24 cm , 05/14/21: 25 cm)  )   ? Time 8   ? Period Weeks   ? Status Achieved   ?  ? PT LONG TERM GOAL #4  ? Title Pt will demo IND with deep core coordination and pelvic floor coordination without compensatory patterns to help with ADLs   ?  Time 4   ? Period Weeks   ? Status Achieved   ?  ?  PT LONG TERM GOAL #5  ? Title Pt will demo less L thoracic shift, less convex curve at T/L junction and more reciprocal gait pattern in order to regain structural midline and to progress to pelvic floor contractions with better outcomes   ? Time 10   ? Period Weeks   ? Status Achieved   ?  ? Additional Long Term Goals  ? Additional Long Term Goals Yes   ?  ? PT LONG TERM GOAL #6  ? Title Pt will report decreased pain by 50% with Bending over, getting out of bed, reaching up overhead activities in order to perform his hobbies and household chores.   ? Time 8   ? Period Weeks   ? Status Achieved   ?  ? PT LONG TERM GOAL #7  ? Title Pt will demo proper deep core coordination, pelvic floor contractions 3 sec, 3 reps in order to to minimize leakage while gardening   ? Baseline chest breathing ( limited anterior diaphragmatic breathing)   ? Time 8   ? Period Weeks   ? Status Achieved   ?  ? PT LONG TERM GOAL #8  ? Title Pt will report getting the sensation of fullness of bladder and being able to empty completely across 2 weeks (05/14/21: still straining, 05/28/21: straining less but still has decreaased awareness of full bladder, 07/09/21: feeling of fullnes of bladder but straining to get the last 50% of the urine, 09/03/2021: straining to get 50% of the urine out  )   ? Time 6   ? Period Weeks   ? Status Partially Met   ? Target Date 10/05/21   ?  ? PT LONG TERM GOAL  #9  ? TITLE Pt will demo increased hip abduction on L from 3+/5 to > 4/5 in order to improve pelvic girdle stability   ? Time 10   ? Period Weeks   ? Status On-going   ? Target Date 11/02/21   ?  ? PT LONG TERM GOAL  #10  ? TITLE Pt will demo proper coordination of pelvic floor and deep core mm in standing posture to urinate without straining   ? Baseline dyscoordination( pushing downward )   ? Time 8   ? Period Weeks   ? Status New   ? Target Date 10/29/21   ?  ? PT LONG TERM GOAL  #11  ? TITLE Pt will report  decreased leakage by 50% ( droplet wetness in pad) when sitting and talking with guests for 2 hours, nailing, taking out the trash   ? Baseline dampness in pad with light work   ? Time 10   ? Period Weeks   ? St

## 2021-09-18 NOTE — Patient Instructions (Signed)
1) long holds 5 sec, 3 reps , 2 rest breaths in between ? ?2) deep core level 1 ? ?3) deep core level 2  ? ?4) long holds 5 sec, 3 reps , 2 rest breaths in between ?

## 2021-09-20 ENCOUNTER — Encounter: Payer: Medicare Other | Admitting: Physical Therapy

## 2021-09-21 ENCOUNTER — Encounter: Payer: Medicare Other | Admitting: Physical Therapy

## 2021-09-26 ENCOUNTER — Ambulatory Visit (INDEPENDENT_AMBULATORY_CARE_PROVIDER_SITE_OTHER): Payer: Medicare Other

## 2021-09-26 VITALS — Wt 160.0 lb

## 2021-09-26 DIAGNOSIS — I459 Conduction disorder, unspecified: Secondary | ICD-10-CM | POA: Insufficient documentation

## 2021-09-26 DIAGNOSIS — R12 Heartburn: Secondary | ICD-10-CM | POA: Insufficient documentation

## 2021-09-26 DIAGNOSIS — S32029A Unspecified fracture of second lumbar vertebra, initial encounter for closed fracture: Secondary | ICD-10-CM | POA: Insufficient documentation

## 2021-09-26 DIAGNOSIS — R7303 Prediabetes: Secondary | ICD-10-CM | POA: Insufficient documentation

## 2021-09-26 DIAGNOSIS — Z0000 Encounter for general adult medical examination without abnormal findings: Secondary | ICD-10-CM

## 2021-09-26 DIAGNOSIS — M461 Sacroiliitis, not elsewhere classified: Secondary | ICD-10-CM | POA: Insufficient documentation

## 2021-09-26 DIAGNOSIS — I679 Cerebrovascular disease, unspecified: Secondary | ICD-10-CM | POA: Insufficient documentation

## 2021-09-26 DIAGNOSIS — R1310 Dysphagia, unspecified: Secondary | ICD-10-CM | POA: Insufficient documentation

## 2021-09-26 DIAGNOSIS — C649 Malignant neoplasm of unspecified kidney, except renal pelvis: Secondary | ICD-10-CM | POA: Insufficient documentation

## 2021-09-26 DIAGNOSIS — Z981 Arthrodesis status: Secondary | ICD-10-CM | POA: Insufficient documentation

## 2021-09-26 DIAGNOSIS — G459 Transient cerebral ischemic attack, unspecified: Secondary | ICD-10-CM | POA: Insufficient documentation

## 2021-09-26 DIAGNOSIS — M545 Low back pain, unspecified: Secondary | ICD-10-CM | POA: Insufficient documentation

## 2021-09-26 DIAGNOSIS — I10 Essential (primary) hypertension: Secondary | ICD-10-CM | POA: Insufficient documentation

## 2021-09-26 DIAGNOSIS — R4189 Other symptoms and signs involving cognitive functions and awareness: Secondary | ICD-10-CM | POA: Insufficient documentation

## 2021-09-26 NOTE — Patient Instructions (Signed)
[name] , Thank you for taking time to come for your Medicare Wellness Visit. I appreciate your ongoing commitment to your health goals. Please review the following plan we discussed and let me know if I can assist you in the future.   Screening recommendations/referrals: Colonoscopy: aged out Recommended yearly ophthalmology/optometry visit for glaucoma screening and checkup Recommended yearly dental visit for hygiene and checkup  Vaccinations: Influenza vaccine: 03/20/21 Pneumococcal vaccine: 05/06/20 Tdap vaccine: n/d Shingles vaccine: n/d   Covid-19: 05/17/19, 06/07/19, 03/02/20, 10/17/20  Advanced directives: no  Conditions/risks identified: none  Next appointment: Follow up in one year for your annual wellness visit. 10/01/22 @ 10:15am by phone  Preventive Care 65 Years and Older, Male Preventive care refers to lifestyle choices and visits with your health care provider that can promote health and wellness. What does preventive care include? A yearly physical exam. This is also called an annual well check. Dental exams once or twice a year. Routine eye exams. Ask your health care provider how often you should have your eyes checked. Personal lifestyle choices, including: Daily care of your teeth and gums. Regular physical activity. Eating a healthy diet. Avoiding tobacco and drug use. Limiting alcohol use. Practicing safe sex. Taking low doses of aspirin every day. Taking vitamin and mineral supplements as recommended by your health care provider. What happens during an annual well check? The services and screenings done by your health care provider during your annual well check will depend on your age, overall health, lifestyle risk factors, and family history of disease. Counseling  Your health care provider may ask you questions about your: Alcohol use. Tobacco use. Drug use. Emotional well-being. Home and relationship well-being. Sexual activity. Eating  habits. History of falls. Memory and ability to understand (cognition). Work and work Statistician. Screening  You may have the following tests or measurements: Height, weight, and BMI. Blood pressure. Lipid and cholesterol levels. These may be checked every 5 years, or more frequently if you are over 8 years old. Skin check. Lung cancer screening. You may have this screening every year starting at age 24 if you have a 30-pack-year history of smoking and currently smoke or have quit within the past 15 years. Fecal occult blood test (FOBT) of the stool. You may have this test every year starting at age 20. Flexible sigmoidoscopy or colonoscopy. You may have a sigmoidoscopy every 5 years or a colonoscopy every 10 years starting at age 29. Prostate cancer screening. Recommendations will vary depending on your family history and other risks. Hepatitis C blood test. Hepatitis B blood test. Sexually transmitted disease (STD) testing. Diabetes screening. This is done by checking your blood sugar (glucose) after you have not eaten for a while (fasting). You may have this done every 1-3 years. Abdominal aortic aneurysm (AAA) screening. You may need this if you are a current or former smoker. Osteoporosis. You may be screened starting at age 57 if you are at high risk. Talk with your health care provider about your test results, treatment options, and if necessary, the need for more tests. Vaccines  Your health care provider may recommend certain vaccines, such as: Influenza vaccine. This is recommended every year. Tetanus, diphtheria, and acellular pertussis (Tdap, Td) vaccine. You may need a Td booster every 10 years. Zoster vaccine. You may need this after age 73. Pneumococcal 13-valent conjugate (PCV13) vaccine. One dose is recommended after age 4. Pneumococcal polysaccharide (PPSV23) vaccine. One dose is recommended after age 82. Talk to your health  care provider about which screenings and  vaccines you need and how often you need them. This information is not intended to replace advice given to you by your health care provider. Make sure you discuss any questions you have with your health care provider. Document Released: 05/19/2015 Document Revised: 01/10/2016 Document Reviewed: 02/21/2015 Elsevier Interactive Patient Education  2017 [employer] Prevention in the Home Falls can cause injuries. They can happen to people of all ages. There are many things you can do to make your home safe and to help prevent falls. What can I do on the outside of my home? Regularly fix the edges of walkways and driveways and fix any cracks. Remove anything that might make you trip as you walk through a door, such as a raised step or threshold. Trim any bushes or trees on the path to your home. Use bright outdoor lighting. Clear any walking paths of anything that might make someone trip, such as rocks or tools. Regularly check to see if handrails are loose or broken. Make sure that both sides of any steps have handrails. Any raised decks and porches should have guardrails on the edges. Have any leaves, snow, or ice cleared regularly. Use sand or salt on walking paths during winter. Clean up any spills in your garage right away. This includes oil or grease spills. What can I do in the bathroom? Use night lights. Install grab bars by the toilet and in the tub and shower. Do not use towel bars as grab bars. Use non-skid mats or decals in the tub or shower. If you need to sit down in the shower, use a plastic, non-slip stool. Keep the floor dry. Clean up any water that spills on the floor as soon as it happens. Remove soap buildup in the tub or shower regularly. Attach bath mats securely with double-sided non-slip rug tape. Do not have throw rugs and other things on the floor that can make you trip. What can I do in the bedroom? Use night lights. Make sure that you have a light by your  bed that is easy to reach. Do not use any sheets or blankets that are too big for your bed. They should not hang down onto the floor. Have a firm chair that has side arms. You can use this for support while you get dressed. Do not have throw rugs and other things on the floor that can make you trip. What can I do in the kitchen? Clean up any spills right away. Avoid walking on wet floors. Keep items that you use a lot in easy-to-reach places. If you need to reach something above you, use a strong step stool that has a grab bar. Keep electrical cords out of the way. Do not use floor polish or wax that makes floors slippery. If you must use wax, use non-skid floor wax. Do not have throw rugs and other things on the floor that can make you trip. What can I do with my stairs? Do not leave any items on the stairs. Make sure that there are handrails on both sides of the stairs and use them. Fix handrails that are broken or loose. Make sure that handrails are as long as the stairways. Check any carpeting to make sure that it is firmly attached to the stairs. Fix any carpet that is loose or worn. Avoid having throw rugs at the top or bottom of the stairs. If you do have throw rugs, attach them to  the floor with carpet tape. Make sure that you have a light switch at the top of the stairs and the bottom of the stairs. If you do not have them, ask someone to add them for you. What else can I do to help prevent falls? Wear shoes that: Do not have high heels. Have rubber bottoms. Are comfortable and fit you well. Are closed at the toe. Do not wear sandals. If you use a stepladder: Make sure that it is fully opened. Do not climb a closed stepladder. Make sure that both sides of the stepladder are locked into place. Ask someone to hold it for you, if possible. Clearly mark and make sure that you can see: Any grab bars or handrails. First and last steps. Where the edge of each step is. Use tools that  help you move around (mobility aids) if they are needed. These include: Canes. Walkers. Scooters. Crutches. Turn on the lights when you go into a dark area. Replace any light bulbs as soon as they burn out. Set up your furniture so you have a clear path. Avoid moving your furniture around. If any of your floors are uneven, fix them. If there are any pets around you, be aware of where they are. Review your medicines with your doctor. Some medicines can make you feel dizzy. This can increase your chance of falling. Ask your doctor what other things that you can do to help prevent falls. This information is not intended to replace advice given to you by your health care provider. Make sure you discuss any questions you have with your health care provider. Document Released: 02/16/2009 Document Revised: 09/28/2015 Document Reviewed: 05/27/2014 Elsevier Interactive Patient Education  2017 [employer].

## 2021-09-26 NOTE — Progress Notes (Signed)
Virtual Visit via Telephone Note  I connected with  Thomas [name] on 09/26/21 at  3:00 PM EDT by telephone and verified that I am speaking with the correct person using two identifiers.  Location: Patient: home  Provider: [facility] Persons participating in the virtual visit: [place]   I discussed the limitations, risks, security and privacy concerns of performing an evaluation and management service by telephone and the availability of in person appointments. The patient expressed understanding and agreed to proceed.  Interactive audio and video telecommunications were attempted between this nurse and patient, however failed, due to patient having technical difficulties OR patient did not have access to video capability.  We continued and completed visit with audio only.  Some vital signs may be absent or patient reported.   Dionisio David, LPN  Subjective:   Thomas [name] is a 88 y.o. male who presents for Medicare Annual/Subsequent preventive examination.  Review of Systems           Objective:    There were no vitals filed for this visit. There is no height or weight on file to calculate BMI.     07/04/2020    8:58 AM 05/07/2020    7:16 AM 05/05/2020   12:00 AM 05/04/2020   12:27 AM 06/16/2019   11:05 AM 03/31/2019    4:25 AM 01/01/2018    1:03 PM  Advanced Directives  Does Patient Have a Medical Advance Directive? Yes Yes Yes No Yes Yes Yes  Type of Paramedic of [place];Living will [place];Living will [place];Living will  [place];Living will [place];Living will [employer];Living will  Does patient want to make changes to medical advance directive? No - Patient declined No - Patient declined No - Patient declined      Copy of [place] in Chart? No - copy requested No - copy requested No - copy requested  No - copy  requested    Would patient like information on creating a medical advance directive?  No - Patient declined No - Patient declined No - Patient declined       Current Medications (verified) Outpatient Encounter Medications as of 09/26/2021  Medication Sig   amLODipine (NORVASC) 5 MG tablet TAKE 1 TABLET BY MOUTH DAILY   atorvastatin (LIPITOR) 40 MG tablet Take 1 tablet (40 mg total) by mouth daily.   calcium carbonate (TUMS - DOSED IN MG ELEMENTAL CALCIUM) 500 MG chewable tablet Chew 1-2 tablets by mouth daily as needed for indigestion.   Cholecalciferol (VITAMIN D3) 5000 units CAPS Take 5,000 Units by mouth daily.   Coenzyme Q10 (COQ10) 100 MG CAPS Take 100 mg by mouth daily.   Cyanocobalamin (VITAMIN B-12 PO) Take 3,000 mcg by mouth daily with breakfast.   dofetilide (TIKOSYN) 125 MCG capsule Take 1 capsule (125 mcg total) by mouth 2 (two) times daily.   ELIQUIS 5 MG TABS tablet TAKE 1 TABLET BY MOUTH TWICE A DAY   febuxostat (ULORIC) 40 MG tablet Take 1 tablet (40 mg total) by mouth daily.   gabapentin (NEURONTIN) 300 MG capsule Take 300 mg by mouth 2 (two) times daily.   Magnesium 500 MG TABS Take 500 mg by mouth at bedtime.   methocarbamol (ROBAXIN) 500 MG tablet Take 500 mg by mouth 3 (three) times daily.   methocarbamol (ROBAXIN) 750 MG tablet Take 750 mg by mouth 2 times daily at 12  noon and 4 pm.   metoprolol succinate (TOPROL-XL) 50 MG 24 hr tablet TAKE 1 TABLET BY MOUTH DAILY WITH OR IMMEDIATELY FOLLOWING A MEAL   metroNIDAZOLE (FLAGYL) 500 MG tablet TAKE 1 TABLET BY MOUTH TWICE DAILY ON SUNDAY AND MONDAY   mirabegron ER (MYRBETRIQ) 25 MG TB24 tablet Take 1 tablet (25 mg total) by mouth daily.   Multiple Vitamins-Minerals (PRESERVISION/LUTEIN PO) Take 1 capsule by mouth in the morning.   NEEDLE, DISP, 18 G (BD DISP NEEDLES) 18G X 1-1/2" MISC 1 mg by Does not apply route every 14 (fourteen) days.   NEEDLE, DISP, 21 G (BD DISP NEEDLES) 21G X 1-1/2" MISC 1 mg by Does not apply route  every 14 (fourteen) days.   omeprazole (PRILOSEC) 20 MG capsule TAKE ONE CAPSULE BY MOUTH EVERY DAY FOR HEARTBURN TAKE 30 MINUTES BEFORE A MEAL   oxyCODONE (OXY IR/ROXICODONE) 5 MG immediate release tablet Take 1 tablet (5 mg total) by mouth every 6 (six) hours as needed for moderate pain or breakthrough pain.   oxyCODONE-acetaminophen (PERCOCET) 7.5-325 MG tablet Take 1-2 tablets by mouth every 4 (four) hours as needed for severe pain.   polyethylene glycol (MIRALAX / GLYCOLAX) 17 g packet Take 17 g by mouth daily as needed for moderate constipation.   predniSONE (DELTASONE) 50 MG tablet Take 10/23 at 8:30 pm, 10/24 at 02:30 am, and 10/24 at 08:30 am (take with Benadryl 50 mg at 08:30 am)   propranolol (INDERAL) 40 MG tablet TAKE ONE (1) TABLET THREE (3) TIMES EACHDAY AS NEEDED   senna (SENOKOT) 8.6 MG tablet Take 1 tablet by mouth 2 (two) times daily as needed for constipation.   sildenafil (REVATIO) 20 MG tablet Take 3-5 tablets 1 hr prior   Syringe, Disposable, (2-3CC SYRINGE) 3 ML MISC 1 mg by Does not apply route every 14 (fourteen) days.   tamsulosin (FLOMAX) 0.4 MG CAPS capsule Take 0.4 mg by mouth at bedtime.   terazosin (HYTRIN) 1 MG capsule TAKE ONE CAPSULE BY MOUTH AT BEDTIME.   Testosterone 20.25 MG/ACT (1.62%) GEL APPLY 2 PUMPS DAILY.   testosterone cypionate (DEPOTESTOSTERONE CYPIONATE) 200 MG/ML injection Inject 1 mL (200 mg total) into the muscle every 28 (twenty-eight) days.   traMADol (ULTRAM) 50 MG tablet Take 50-100 mg by mouth 4 (four) times daily as needed for severe pain.   Turmeric 500 MG CAPS Take 500 mg by mouth daily.   vancomycin (VANCOCIN) 125 MG capsule Take 125 mg by mouth 4 (four) times daily.   No facility-administered encounter medications on file as of 09/26/2021.    Allergies (verified) Other, Iodine, and Penicillins   History: Past Medical History:  Diagnosis Date   Aortic valve disorders    Arthritis    Atrial flutter ([place]) 06/18/2016   "AF or AFl;  not sure which" (06/23/2016)   Basal cell carcinoma    "face, nose left shoulder, left arm" (06/19/2016)   Basal cell carcinoma 09/13/2020   right temple   BBB (bundle branch block)    hx right   Chronic back pain    "neck, thoracic, lower back" (06/19/2016)   Complete heart block ([place]) 06/2016   Dyspnea    GERD (gastroesophageal reflux disease)    Gout    Heart block    "I've had type I, II [place] before now" (06/19/2016)   History of gout    History of hiatal hernia    "self dx'd" (06/19/2016)   Hyperlipidemia    Hypertension    Lyme  disease    "dx'd by me 2003; cx's showed dx 08/2015"   Migraine    "3-4/year" (06/19/2016)   Presence of permanent cardiac pacemaker 06/19/2016   PVC's (premature ventricular contractions)    Renal cancer, left ([place]) 2006   S/P cryotherapy   Spinal stenosis    "cervical, 1 thoracic, lumbar" (06/19/2016)   Squamous carcinoma    "face, nose left shoulder, left arm" (06/19/2016)   Stroke ([place])    TIA (transient ischemic attack) 06/14/2016   "I'm not sure that's what it was" (06/25/2016)   Visit for monitoring Tikosyn therapy 09/09/2017   Past Surgical History:  Procedure Laterality Date   ANKLE FRACTURE SURGERY Right 1967   BACK SURGERY  05/07/2020   BASAL CELL CARCINOMA EXCISION     "face, nose left shoulder, left arm"   BIOPSY PROSTATE  2001 & 2003   CARDIAC CATHETERIZATION  1990's   CARDIOVERSION N/A 09/11/2017   Procedure: CARDIOVERSION;  Surgeon: Lelon Perla, MD;  Location: [facility];  Service: Cardiovascular;  Laterality: N/A;   FRACTURE SURGERY     HOLEP-LASER ENUCLEATION OF THE PROSTATE WITH MORCELLATION N/A 07/10/2020   Procedure: HOLEP-LASER ENUCLEATION OF THE PROSTATE WITH MORCELLATION;  Surgeon: Hollice Espy, MD;  Location: [facility];  Service: Urology;  Laterality: N/A;   INGUINAL HERNIA REPAIR Left 2012   INSERT / REPLACE / REMOVE PACEMAKER  06/19/2016   LAPAROSCOPIC ABLATION RENAL MASS     LEFT HEART CATH AND CORONARY  ANGIOGRAPHY Left 10/23/2017   Procedure: LEFT HEART CATH AND CORONARY ANGIOGRAPHY;  Surgeon: Wellington Hampshire, MD;  Location: [facility];  Service: Cardiovascular;  Laterality: Left;   LEFT HEART CATH AND CORONARY ANGIOGRAPHY Left 02/26/2021   Procedure: LEFT HEART CATH AND CORONARY ANGIOGRAPHY;  Surgeon: Wellington Hampshire, MD;  Location: [facility];  Service: Cardiovascular;  Laterality: Left;   PACEMAKER IMPLANT N/A 06/19/2016   Procedure: Pacemaker Implant;  Surgeon: Deboraha Sprang, MD;  Location: [facility];  Service: Cardiovascular;  Laterality: N/A;   pacemasker     PROSTATE SURGERY     SQUAMOUS CELL CARCINOMA EXCISION     "face, nose left shoulder, left arm"   TONSILLECTOMY AND ADENOIDECTOMY     Family History  Problem Relation Age of Onset   Heart attack Brother    Stroke Brother    Aortic stenosis Mother    Arthritis Father    Social History   Socioeconomic History   Marital status: Married    Spouse name: Not on file   Number of children: 3   Years of education: MD    Highest education level: Master's degree (e.g., MA, MS, MEng, MEd, MSW, MBA)  Occupational History   Occupation: Retired   Tobacco Use   Smoking status: Never   Smokeless tobacco: Never  Vaping Use   Vaping Use: Never used  Substance and Sexual Activity   Alcohol use: Yes    Alcohol/week: 1.0 standard drink    Types: 1 Glasses of wine per week   Drug use: No   Sexual activity: Yes  Other Topics Concern   Not on file  Social History Narrative   Independent at baseline. Lives with family   Drinks tea in the morning    Social Determinants of Health   Financial Resource Strain: Not on file  Food Insecurity: Not on file  Transportation Needs: Not on file  Physical Activity: Not on file  Stress: Not on file  Social Connections:  Not on file    Tobacco Counseling Counseling given: Not Answered   Clinical Intake:  Pre-visit preparation completed: Yes  Pain :  No/denies pain     Nutritional Risks: None Diabetes: No  How often do you need to have someone help you when you read instructions, pamphlets, or other written materials from your doctor or pharmacy?: 1 - Never  Diabetic?no  Interpreter Needed?: No  Information entered by :: Kirke Shaggy, LPN   Activities of Daily Living    08/21/2021    1:31 PM 02/26/2021    9:31 AM  In your present state of health, do you have any difficulty performing the following activities:  Hearing? 1 0  Vision? 1 0  Difficulty concentrating or making decisions? 0 0  Walking or climbing stairs? 0 0  Dressing or bathing? 0 0  Doing errands, shopping? 0     Patient Care Team: Jerrol Banana., MD as PCP - General (Family Medicine) Wellington Hampshire, MD as PCP - Cardiology (Cardiology) Deboraha Sprang, MD as PCP - Electrophysiology (Cardiology) Deboraha Sprang, MD as Consulting Physician (Cardiology) Leandrew Koyanagi, MD as Referring Physician (Ophthalmology) Wellington Hampshire, MD as Consulting Physician (Cardiology) Abbie Sons, MD (Urology) Emmaline Kluver., MD (Rheumatology) Ralene Bathe, MD (Dermatology)  Indicate any recent Medical Services you may have received from other than [facility] providers in the past year (date may be approximate).     Assessment:   This is a routine wellness examination for Thomas [name].  Hearing/Vision screen No results found.  Dietary issues and exercise activities discussed:     Goals Addressed   None    Depression Screen    08/21/2021    1:31 PM 10/17/2020    3:33 PM 10/11/2019    2:43 PM 06/16/2019   11:02 AM 07/08/2018    2:26 PM 01/01/2018    1:02 PM 11/27/2017   11:04 AM  PHQ 2/9 Scores  PHQ - 2 Score 0 0 0 0 0 0 0  PHQ- 9 Score 3 2         Fall Risk    08/21/2021    1:30 PM 10/17/2020    3:34 PM 10/11/2019    2:43 PM 06/16/2019   11:05 AM 03/31/2019   12:15 PM  Fall Risk   Falls in the past year? 0 0 0 0 0  Number falls in  past yr: 0 0  0   Injury with Fall? 0 0  0   Risk for fall due to : No Fall Risks Impaired balance/gait     Follow up Falls evaluation completed Falls prevention discussed       FALL RISK PREVENTION PERTAINING TO THE HOME:  Any stairs in or around the home? No  If so, are there any without handrails? No  Home free of loose throw rugs in walkways, pet beds, electrical cords, etc? Yes  Adequate lighting in your home to reduce risk of falls? Yes   ASSISTIVE DEVICES UTILIZED TO PREVENT FALLS:  Life alert? No  Use of a cane, walker or w/c? No  Grab bars in the bathroom? Yes  Shower chair or bench in shower? No  Elevated toilet seat or a handicapped toilet? No     Cognitive Function:0 points 6CIT        11/26/2016    3:37 PM  6CIT Screen  What Year? 0 points  What month? 0 points  What time? 0 points  Count back  from 20 0 points  Months in reverse 0 points  Repeat phrase 0 points  Total Score 0 points    Immunizations Immunization History  Administered Date(s) Administered   Fluad Quad(high Dose 65+) 02/04/2019, 05/09/2020, 03/20/2021   Influenza, High Dose Seasonal PF 02/07/2015, 03/28/2018   Influenza-Unspecified 03/19/2021   PFIZER Comirnaty(Gray Top)Covid-19 Tri-Sucrose Vaccine 10/17/2020   PFIZER(Purple Top)SARS-COV-2 Vaccination 05/17/2019, 06/07/2019, 03/02/2020   Pneumococcal Polysaccharide-23 05/06/2020    TDAP status: Due, Education has been provided regarding the importance of this vaccine. Advised may receive this vaccine at local pharmacy or Health Dept. Aware to provide a copy of the vaccination record if obtained from local pharmacy or Health Dept. Verbalized acceptance and understanding.  Flu Vaccine status: Up to date  Pneumococcal vaccine status: Due, Education has been provided regarding the importance of this vaccine. Advised may receive this vaccine at local pharmacy or Health Dept. Aware to provide a copy of the vaccination record if obtained from  local pharmacy or Health Dept. Verbalized acceptance and understanding.  Covid-19 vaccine status: Completed vaccines  Qualifies for Shingles Vaccine? Yes   Zostavax completed No   Shingrix Completed?: No.    Education has been provided regarding the importance of this vaccine. Patient has been advised to call insurance company to determine out of pocket expense if they have not yet received this vaccine. Advised may also receive vaccine at local pharmacy or Health Dept. Verbalized acceptance and understanding.  Screening Tests Health Maintenance  Topic Date Due   TETANUS/TDAP  Never done   Zoster Vaccines- Shingrix (1 of 2) Never done   COVID-19 Vaccine (5 - Booster for Pfizer series) 12/12/2020   Pneumonia Vaccine 71+ Years old (2 - PCV) 05/06/2021   INFLUENZA VACCINE  12/04/2021   HPV VACCINES  Aged Out    Health Maintenance  Health Maintenance Due  Topic Date Due   TETANUS/TDAP  Never done   Zoster Vaccines- Shingrix (1 of 2) Never done   COVID-19 Vaccine (5 - Booster for Pfizer series) 12/12/2020   Pneumonia Vaccine 42+ Years old (2 - PCV) 05/06/2021    Colorectal cancer screening: No longer required.   Lung Cancer Screening: (Low Dose CT Chest recommended if Age 38-80 years, 30 pack-year currently smoking OR have quit w/in 15years.) does not qualify.    Additional Screening:  Hepatitis C Screening: does not qualify; Completed no  Vision Screening: Recommended annual ophthalmology exams for early detection of glaucoma and other disorders of the eye. Is the patient up to date with their annual eye exam?  Yes  Who is the provider or what is the name of the office in which the patient attends annual eye exams? [facility] If pt is not established with a provider, would they like to be referred to a provider to establish care? No .   Dental Screening: Recommended annual dental exams for proper oral hygiene  Community Resource Referral / Chronic Care  Management: CRR required this visit?  No   CCM required this visit?  No      Plan:     I have personally reviewed and noted the following in the patient's chart:   Medical and social history Use of alcohol, tobacco or illicit drugs  Current medications and supplements including opioid prescriptions. Patient is currently taking opioid prescriptions. Information provided to patient regarding non-opioid alternatives. Patient advised to discuss non-opioid treatment plan with their provider. Functional ability and status Nutritional status Physical activity Advanced directives List of other physicians  Hospitalizations, surgeries, and ER visits in previous 12 months Vitals Screenings to include cognitive, depression, and falls Referrals and appointments  In addition, I have reviewed and discussed with patient certain preventive protocols, quality metrics, and best practice recommendations. A written personalized care plan for preventive services as well as general preventive health recommendations were provided to patient.     Dionisio David, LPN   [id number]/98/[id number]   Nurse Notes: none

## 2021-09-28 DIAGNOSIS — S92534A Nondisplaced fracture of distal phalanx of right lesser toe(s), initial encounter for closed fracture: Secondary | ICD-10-CM | POA: Diagnosis not present

## 2021-09-28 DIAGNOSIS — S99921A Unspecified injury of right foot, initial encounter: Secondary | ICD-10-CM | POA: Diagnosis not present

## 2021-10-03 ENCOUNTER — Ambulatory Visit: Payer: Medicare Other | Admitting: Physical Therapy

## 2021-10-03 DIAGNOSIS — R293 Abnormal posture: Secondary | ICD-10-CM

## 2021-10-03 DIAGNOSIS — M4125 Other idiopathic scoliosis, thoracolumbar region: Secondary | ICD-10-CM

## 2021-10-03 DIAGNOSIS — M533 Sacrococcygeal disorders, not elsewhere classified: Secondary | ICD-10-CM

## 2021-10-03 DIAGNOSIS — R2689 Other abnormalities of gait and mobility: Secondary | ICD-10-CM

## 2021-10-03 DIAGNOSIS — M545 Low back pain, unspecified: Secondary | ICD-10-CM | POA: Diagnosis not present

## 2021-10-03 DIAGNOSIS — N39498 Other specified urinary incontinence: Secondary | ICD-10-CM

## 2021-10-03 DIAGNOSIS — G8929 Other chronic pain: Secondary | ICD-10-CM | POA: Diagnosis not present

## 2021-10-03 NOTE — Therapy (Signed)
[place] MAIN [facility] SERVICES [address] [place], [place], [address] Phone: [phone]   Fax:  [phone]  Physical Therapy Treatment  Patient Details  Name: Thomas [name] MRN: [id number] Date of Birth: February 24, 1933 No data recorded  Encounter Date: 10/03/2021   PT End of Session - 10/03/21 1315     Visit Number 27    Date for PT Re-Evaluation 11/02/21   09/18/20, PN 44/9/67, recert 5/91/63   Authorization Type MC review after 25 visits    PT Start Time 1310    PT Stop Time 1411    PT Time Calculation (min) 61 min    Activity Tolerance Patient tolerated treatment well;No increased pain    Behavior During Therapy WFL for tasks assessed/performed             Past Medical History:  Diagnosis Date   Aortic valve disorders    Arthritis    Atrial flutter ([place]) 06/18/2016   "AF or AFl; not sure which" (06/23/2016)   Basal cell carcinoma    "face, nose left shoulder, left arm" (06/19/2016)   Basal cell carcinoma 09/13/2020   right temple   BBB (bundle branch block)    hx right   Chronic back pain    "neck, thoracic, lower back" (06/19/2016)   Complete heart block ([place]) 06/2016   Dyspnea    GERD (gastroesophageal reflux disease)    Gout    Heart block    "I've had type I, II [place] before now" (06/19/2016)   History of gout    History of hiatal hernia    "self dx'd" (06/19/2016)   Hyperlipidemia    Hypertension    Lyme disease    "dx'd by me 2003; cx's showed dx 08/2015"   Migraine    "3-4/year" (06/19/2016)   Presence of permanent cardiac pacemaker 06/19/2016   PVC's (premature ventricular contractions)    Renal cancer, left ([place]) 2006   S/P cryotherapy   Spinal stenosis    "cervical, 1 thoracic, lumbar" (06/19/2016)   Squamous carcinoma    "face, nose left shoulder, left arm" (06/19/2016)   Stroke ([place])    TIA (transient ischemic attack) 06/14/2016   "I'm not sure that's what it was" (06/25/2016)   Visit for monitoring  Tikosyn therapy 09/09/2017    Past Surgical History:  Procedure Laterality Date   ANKLE FRACTURE SURGERY Right 1967   BACK SURGERY  05/07/2020   BASAL CELL CARCINOMA EXCISION     "face, nose left shoulder, left arm"   BIOPSY PROSTATE  2001 & 2003   CARDIAC CATHETERIZATION  1990's   CARDIOVERSION N/A 09/11/2017   Procedure: CARDIOVERSION;  Surgeon: Lelon Perla, MD;  Location: [facility];  Service: Cardiovascular;  Laterality: N/A;   FRACTURE SURGERY     HOLEP-LASER ENUCLEATION OF THE PROSTATE WITH MORCELLATION N/A 07/10/2020   Procedure: HOLEP-LASER ENUCLEATION OF THE PROSTATE WITH MORCELLATION;  Surgeon: Hollice Espy, MD;  Location: [facility];  Service: Urology;  Laterality: N/A;   INGUINAL HERNIA REPAIR Left 2012   INSERT / REPLACE / REMOVE PACEMAKER  06/19/2016   LAPAROSCOPIC ABLATION RENAL MASS     LEFT HEART CATH AND CORONARY ANGIOGRAPHY Left 10/23/2017   Procedure: LEFT HEART CATH AND CORONARY ANGIOGRAPHY;  Surgeon: Wellington Hampshire, MD;  Location: [facility];  Service: Cardiovascular;  Laterality: Left;   LEFT HEART CATH AND CORONARY ANGIOGRAPHY Left 02/26/2021   Procedure: LEFT HEART CATH AND CORONARY ANGIOGRAPHY;  Surgeon: Wellington Hampshire,  MD;  Location: [facility];  Service: Cardiovascular;  Laterality: Left;   PACEMAKER IMPLANT N/A 06/19/2016   Procedure: Pacemaker Implant;  Surgeon: Deboraha Sprang, MD;  Location: [place];  Service: Cardiovascular;  Laterality: N/A;   pacemasker     PROSTATE SURGERY     SQUAMOUS CELL CARCINOMA EXCISION     "face, nose left shoulder, left arm"   TONSILLECTOMY AND ADENOIDECTOMY      There were no vitals filed for this visit.   Subjective Assessment - 10/03/21 1315     Subjective pt noticed he is leaking very little at night while sleeping and also while sitting.  Pt is starting to feel tingling around waist line which is sign of his nerves return after his L2 vertebra Fx 1/5 years ago.    Pertinent  History Lumbar imagining 05/09/19   "L2 compression deformity is again identified. L1 and L3 pedicle  screws are seen with posterior fixation identified. The overall  appearance is stable from the intraoperative exam. Mild degenerative  anterolisthesis of L4 on L5 is seen. Intraoperative spot films of the lumbar spine demonstrate initially  2 posterior spinal needles marking the T12 and L3 spinous processes."   HOLEP 07/2020                [facility] PT Assessment - 10/03/21 1403       Palpation   Spinal mobility tightness along intercostals, interspinals, paraspinals, medial scapula, cervical interspinals L , hypomobile C/ T junction                           [facility] Adult PT Treatment/Exercise - 10/03/21 1406       Neuro Re-ed    Neuro Re-ed Details  cued for more upright posture in seated kegels  , cued for upgrade for cervico-scapular strengthening band exercise      Modalities   Modalities Moist Heat      Moist Heat Therapy   Number Minutes Moist Heat 5 Minutes    Moist Heat Location --   cervical/thoracic during assessment of long pelvic floor contractions     Manual Therapy   Manual therapy comments STM/MWM at problem areas noted in assessment to promote more cervical retraction                          PT Long Term Goals - 09/03/21 1421       PT LONG TERM GOAL #1   Title Pt will complete a chart detailing the method he uses for continence in relationship with activities across each day of the week in order to gather data for baseline    Time 2    Period Weeks    Status Achieved      PT LONG TERM GOAL #2   Title Pt will improve gait mechanics and posture  to increase walking endurance from 20 min to > 30 min in order to walk with less pain    Time 6    Period Weeks    Status Achieved      PT LONG TERM GOAL #3   Title Pt will demo less forward head posture from 30 cm from earlobe to wall to < 25 cm in order to achieve more upright posture  to optimize IAP system for postural stability ( less LBP) and urinary continence 12/19/20: 26 cm  03/05/21: 24 cm , 05/14/21: 25 cm)  )  Time 8    Period Weeks    Status Achieved      PT LONG TERM GOAL #4   Title Pt will demo IND with deep core coordination and pelvic floor coordination without compensatory patterns to help with ADLs    Time 4    Period Weeks    Status Achieved      PT LONG TERM GOAL #5   Title Pt will demo less L thoracic shift, less convex curve at T/L junction and more reciprocal gait pattern in order to regain structural midline and to progress to pelvic floor contractions with better outcomes    Time 10    Period Weeks    Status Achieved      Additional Long Term Goals   Additional Long Term Goals Yes      PT LONG TERM GOAL #6   Title Pt will report decreased pain by 50% with Bending over, getting out of bed, reaching up overhead activities in order to perform his hobbies and household chores.    Time 8    Period Weeks    Status Achieved      PT LONG TERM GOAL #7   Title Pt will demo proper deep core coordination, pelvic floor contractions 3 sec, 3 reps in order to to minimize leakage while gardening    Baseline chest breathing ( limited anterior diaphragmatic breathing)    Time 8    Period Weeks    Status Achieved      PT LONG TERM GOAL #8   Title Pt will report getting the sensation of fullness of bladder and being able to empty completely across 2 weeks (05/14/21: still straining, 05/28/21: straining less but still has decreaased awareness of full bladder, 07/09/21: feeling of fullnes of bladder but straining to get the last 50% of the urine, 09/03/2021: straining to get 50% of the urine out  )    Time 6    Period Weeks    Status Partially Met    Target Date 10/05/21      PT LONG TERM GOAL  #9   TITLE Pt will demo increased hip abduction on L from 3+/5 to > 4/5 in order to improve pelvic girdle stability    Time 10    Period Weeks    Status On-going     Target Date 11/02/21      PT LONG TERM GOAL  #10   TITLE Pt will demo proper coordination of pelvic floor and deep core mm in standing posture to urinate without straining    Baseline dyscoordination( pushing downward )    Time 8    Period Weeks    Status New    Target Date 10/29/21      PT LONG TERM GOAL  #11   TITLE Pt will report decreased leakage by 50% ( droplet wetness in pad) when sitting and talking with guests for 2 hours, nailing, taking out the trash    Baseline dampness in pad with light work    Time 10    Period Weeks    Status New    Target Date 11/12/21                   Plan - 10/03/21 1409     Clinical Impression Statement Progressed with upgrade of cervicosacpular strengthening exercise with green band. Manual Tx provided to promote C/T junction mobility to minimize forward head.  Pt is IND with quick and long contractions of pelvic floor  with minor cues required and is reporting decreased leakage with sitting and while at night sleeping.  very little at night while sleeping and also while sitting.  Pt is starting to feel tingling around waist line which is sign of his nerves return after his L2 vertebra Fx 1/5 years ago.   Pt  benefit from skilled PT.    Personal Factors and Comorbidities Comorbidity 3+    Comorbidities Lumbar fusion L1-3    Examination-Activity Limitations Toileting;Lift;Bed Mobility;Locomotion Level;Bend;Sit;Sleep;Continence;Squat;Stand;Transfers;Reach Overhead    Stability/Clinical Decision Making Evolving/Moderate complexity    Rehab Potential Good    PT Frequency 1x / week    PT Duration Other (comment)   10   PT Treatment/Interventions Moist Heat;Therapeutic activities;Therapeutic exercise;Patient/family education;Neuromuscular re-education;Stair training;Gait training;Manual techniques;Taping;Splinting;Dry needling;Balance training;ADLs/Self Care Home Management;Cryotherapy;Traction;Functional mobility training;Energy  conservation;Joint Manipulations;Passive range of motion;Scar mobilization    Consulted and Agree with Plan of Care Patient             Patient will benefit from skilled therapeutic intervention in order to improve the following deficits and impairments:  Decreased activity tolerance, Decreased endurance, Decreased range of motion, Decreased strength, Decreased coordination, Decreased mobility, Decreased scar mobility, Abnormal gait, Increased fascial restricitons, Impaired sensation, Improper body mechanics, Pain, Hypermobility, Increased muscle spasms, Hypomobility, Difficulty walking, Decreased knowledge of precautions, Decreased balance, Postural dysfunction, Cardiopulmonary status limiting activity, Decreased safety awareness  Visit Diagnosis: Abnormal posture  Other idiopathic scoliosis, thoracolumbar region  Other urinary incontinence  Sacrococcygeal disorders, not elsewhere classified  Other abnormalities of gait and mobility  Chronic low back pain without sciatica, unspecified back pain laterality     Problem List Patient Active Problem List   Diagnosis Date Noted   Closed fracture of second lumbar vertebra ([place]) 09/26/2021   Dysphagia, unspecified 09/26/2021   Heart block 09/26/2021   Heartburn 09/26/2021   History of lumbar fusion 09/26/2021   Inflammation of sacroiliac joint ([place]) 09/26/2021   Other symptoms and signs involving cognitive functions and awareness 09/26/2021   Prediabetes 09/26/2021   Low back pain 09/26/2021   Renal cell carcinoma ([place]) 09/26/2021   Essential hypertension 09/26/2021   Cerebrovascular disease 09/26/2021   Transient cerebral ischemic attack, unspecified 09/26/2021   Cerebrovascular disease, unspecified 09/26/2021   Transient ischemic attack 09/26/2021   Unstable angina (HCC)    Lumbar burst fracture ([place]) 05/04/2020   Hypogonadism male 10/10/2019   Persistent atrial fibrillation ([place]) 01/12/2019   Acute low back pain  11/04/2017   Abnormal screening cardiac CT    Visit for monitoring Tikosyn therapy 09/09/2017   History of stroke 08/29/2016   Paroxysmal atrial fibrillation ([place]) 07/23/2016   Coronary artery disease 07/23/2016   Snores 06/27/2016   Cardiac pacemaker in situ 06/25/2016   Hemiparesis ([place]) 06/25/2016   Acute ischemic stroke (HCC)    Acute left hemiparesis (HCC)    Hemisensory loss    Dysarthria    Stroke-like symptoms    Complete heart block (HCC)    Pain in thoracic spine    Orthostasis    Lethargy    Occlusion of vertebral artery    TIA (transient ischemic attack) 06/14/2016   Arthritis 02/06/2015   Esophageal reflux 02/06/2015   Arthritis urica 02/06/2015   Cannot sleep 02/06/2015   Arthritis, degenerative 02/06/2015   Adenocarcinoma, renal cell ([place]) 02/06/2015   Benign prostatic hyperplasia with lower urinary tract symptoms 11/21/2014   Personal history of other malignant neoplasm of kidney 11/21/2014   History of Lyme disease 05/06/2014   Palpitations 12/31/2013  Hyperlipidemia 11/02/2010   HYPERTENSION, BENIGN 04/16/2010    Jerl Mina, PT 10/03/2021, 2:13 PM  [place] MAIN [facility] SERVICES [address] [place], [place], [address] Phone: [phone]   Fax:  [phone]  Name: SHANAN MCMILLER MRN: [id number] Date of Birth: 1932-08-15

## 2021-10-03 NOTE — Patient Instructions (Addendum)
Discontinue the locust   And instead do:  Pillow under belly  Hold band in hand with thumbs out, distance by the pockets to pull on exhale as you lift chest   10 x 2 sets   ___  _wall stretch Clock stretch with head turns :  stand perpendicular to the wall, L side to the wall Tilt head to wall  Place L palm at 7 o clock Chin tuck like you are looking into armpit Look at "[place] on giant map  Swivel head with chin tucked to look in upper corner of ceiling as if you are look at  [place] on giant map "   5 reps   Switch direction, R palm on wall at 5 o 'clock   Chin tuck like you are looking into armpit Look at "[place]  on giant map  Swivel head with chin tucked to look in upper corner of ceiling as if you are look at  [facility] on giant map "   5 reps    __

## 2021-10-05 ENCOUNTER — Encounter: Payer: Medicare Other | Admitting: Physical Therapy

## 2021-10-05 ENCOUNTER — Other Ambulatory Visit: Payer: Self-pay | Admitting: Urology

## 2021-10-05 ENCOUNTER — Telehealth: Payer: Self-pay | Admitting: *Deleted

## 2021-10-05 MED ORDER — CLOTRIMAZOLE-BETAMETHASONE 1-0.05 % EX CREA: 1.0000 "application " | TOPICAL_CREAM | Freq: Two times a day (BID) | CUTANEOUS | 0 refills | Status: AC

## 2021-10-05 NOTE — Telephone Encounter (Signed)
Wife calling asking if patient can get a prescription for a rash on the tip of his penis. Wife states pt was wearing a condom cath and now he has redness, pain and itching. Symptoms started yesterday.   ( Former [facility] provider)

## 2021-10-05 NOTE — Telephone Encounter (Signed)
Rx Lotrisone sent-patient notified

## 2021-10-09 ENCOUNTER — Telehealth: Payer: Self-pay | Admitting: Internal Medicine

## 2021-10-09 NOTE — Telephone Encounter (Signed)
   Pre-operative Risk Assessment    Patient Name: Thomas [name]  DOB: 1932/06/25 MRN: [id number]      Request for Surgical Clearance    Procedure:   Colonoscopy / EGD   Date of Surgery:  Clearance 11/22/21                                 Surgeon:  not indicated  Surgeon's Group or Practice Name:  [facility]  Phone number:  [phone] Fax number:  [phone]   Type of Clearance Requested:   - Pharmacy:  Hold Apixaban (Eliquis) hold 2 days   Type of Anesthesia:  Not Indicated   Additional requests/questions:   ICD form faxed to [place], [facility]   10/09/2021, 2:45 PM

## 2021-10-10 NOTE — Telephone Encounter (Signed)
    Patient Name: Thomas [name]  DOB: 04/26/1933 MRN: [id number]  Primary Cardiologist: [name], MD  Clinical pharmacists have reviewed the patient's chart including history, labs, and current medications as part of pre-operative protocol coverage.   The following recommendations have been made:  Patient with diagnosis of A Fib on Eliquis for anticoagulation.     Procedure: colonoscopy Date of procedure: 11/22/21     CHA2DS2-VASc Score = 6  This indicates a 9.7% annual risk of stroke. The patient's score is based upon: CHF History: 0 HTN History: 1 Diabetes History: 0 Stroke History: 2 Vascular Disease History: 1 Age Score: 2 Gender Score: 0     CrCl 37 mL/min Platelet count 166K   Per office protocol, patient can hold Eliquis for 1 days prior to procedure due to history of stroke. Please resume Eliquis as soon as possible postprocedure, at the discretion of the surgeon.   I will route this recommendation to the requesting party via Epic fax function and remove from pre-op pool.  Please call with questions.  Lenna Sciara, NP 10/10/2021, 5:50 PM

## 2021-10-10 NOTE — Telephone Encounter (Signed)
Patient with diagnosis of A Fib on Eliquis for anticoagulation.    Procedure: colonoscopy Date of procedure: 11/22/21   CHA2DS2-VASc Score = 6  This indicates a 9.7% annual risk of stroke. The patient's score is based upon: CHF History: 0 HTN History: 1 Diabetes History: 0 Stroke History: 2 Vascular Disease History: 1 Age Score: 2 Gender Score: 0   CrCl 37 mL/min Platelet count 166K  Per office protocol, patient can hold Eliquis for 1 days prior to procedure due to history of stroke

## 2021-10-18 ENCOUNTER — Encounter: Payer: Medicare Other | Admitting: Dermatology

## 2021-10-30 ENCOUNTER — Ambulatory Visit: Payer: Medicare Other | Attending: Urology | Admitting: Physical Therapy

## 2021-10-30 ENCOUNTER — Ambulatory Visit (INDEPENDENT_AMBULATORY_CARE_PROVIDER_SITE_OTHER): Payer: Medicare Other

## 2021-10-30 DIAGNOSIS — N39498 Other specified urinary incontinence: Secondary | ICD-10-CM | POA: Insufficient documentation

## 2021-10-30 DIAGNOSIS — I442 Atrioventricular block, complete: Secondary | ICD-10-CM | POA: Diagnosis not present

## 2021-10-30 DIAGNOSIS — R293 Abnormal posture: Secondary | ICD-10-CM | POA: Insufficient documentation

## 2021-10-30 DIAGNOSIS — M4125 Other idiopathic scoliosis, thoracolumbar region: Secondary | ICD-10-CM | POA: Insufficient documentation

## 2021-10-30 DIAGNOSIS — G8929 Other chronic pain: Secondary | ICD-10-CM | POA: Diagnosis not present

## 2021-10-30 DIAGNOSIS — M533 Sacrococcygeal disorders, not elsewhere classified: Secondary | ICD-10-CM | POA: Diagnosis not present

## 2021-10-30 DIAGNOSIS — M545 Low back pain, unspecified: Secondary | ICD-10-CM | POA: Insufficient documentation

## 2021-10-30 DIAGNOSIS — R2689 Other abnormalities of gait and mobility: Secondary | ICD-10-CM | POA: Diagnosis not present

## 2021-10-30 LAB — CUP PACEART REMOTE DEVICE CHECK
Battery Remaining Longevity: 46 mo
Battery Voltage: 2.95 V
Brady Statistic AP VP Percent: 95.9 %
Brady Statistic AP VS Percent: 0 %
Brady Statistic AS VP Percent: 4.07 %
Brady Statistic AS VS Percent: 0.03 %
Brady Statistic RA Percent Paced: 95.82 %
Brady Statistic RV Percent Paced: 99.96 %
Date Time Interrogation Session: 20230627020348
Implantable Lead Implant Date: 20180214
Implantable Lead Implant Date: 20180214
Implantable Lead Location: 753859
Implantable Lead Location: 753860
Implantable Lead Model: 5076
Implantable Lead Model: 5076
Implantable Pulse Generator Implant Date: 20180214
Lead Channel Impedance Value: 304 Ohm
Lead Channel Impedance Value: 342 Ohm
Lead Channel Impedance Value: 380 Ohm
Lead Channel Impedance Value: 399 Ohm
Lead Channel Pacing Threshold Amplitude: 0.5 V
Lead Channel Pacing Threshold Amplitude: 0.875 V
Lead Channel Pacing Threshold Pulse Width: 0.4 ms
Lead Channel Pacing Threshold Pulse Width: 0.4 ms
Lead Channel Sensing Intrinsic Amplitude: 2.75 mV
Lead Channel Sensing Intrinsic Amplitude: 2.75 mV
Lead Channel Sensing Intrinsic Amplitude: 4.625 mV
Lead Channel Sensing Intrinsic Amplitude: 4.625 mV
Lead Channel Setting Pacing Amplitude: 2 V
Lead Channel Setting Pacing Amplitude: 2.5 V
Lead Channel Setting Pacing Pulse Width: 0.4 ms
Lead Channel Setting Sensing Sensitivity: 2 mV

## 2021-10-30 NOTE — Therapy (Signed)
[facility] MAIN [facility] SERVICES [address] [place], [place], [address] Phone: [phone]   Fax:  [phone]  Physical Therapy Treatment  Patient Details  Name: Thomas [name] MRN: [id number] Date of Birth: 1933/04/22 No data recorded  Encounter Date: 10/30/2021   PT End of Session - 10/30/21 1519     Visit Number 28    Date for PT Re-Evaluation 11/02/21   09/18/20, PN 02/05/21, recert 08/24/21   Authorization Type [facility] review after 25 visits    PT Start Time 1504    PT Stop Time 1600    PT Time Calculation (min) 56 min    Activity Tolerance Patient tolerated treatment well;No increased pain    Behavior During Therapy WFL for tasks assessed/performed             Past Medical History:  Diagnosis Date   Aortic valve disorders    Arthritis    Atrial flutter (HCC) 06/18/2016   "AF or AFl; not sure which" (06/23/2016)   Basal cell carcinoma    "face, nose left shoulder, left arm" (06/19/2016)   Basal cell carcinoma 09/13/2020   right temple   BBB (bundle branch block)    hx right   Chronic back pain    "neck, thoracic, lower back" (06/19/2016)   Complete heart block (HCC) 06/2016   Dyspnea    GERD (gastroesophageal reflux disease)    Gout    Heart block    "I've had type I, II Wenke before now" (06/19/2016)   History of gout    History of hiatal hernia    "self dx'd" (06/19/2016)   Hyperlipidemia    Hypertension    Lyme disease    "dx'd by me 2003; cx's showed dx 08/2015"   Migraine    "3-4/year" (06/19/2016)   Presence of permanent cardiac pacemaker 06/19/2016   PVC's (premature ventricular contractions)    Renal cancer, left (HCC) 2006   S/P cryotherapy   Spinal stenosis    "cervical, 1 thoracic, lumbar" (06/19/2016)   Squamous carcinoma    "face, nose left shoulder, left arm" (06/19/2016)   Stroke (HCC)    TIA (transient ischemic attack) 06/14/2016   "I'm not sure that's what it was" (06/25/2016)   Visit for monitoring  Tikosyn therapy 09/09/2017    Past Surgical History:  Procedure Laterality Date   ANKLE FRACTURE SURGERY Right 1967   BACK SURGERY  05/07/2020   BASAL CELL CARCINOMA EXCISION     "face, nose left shoulder, left arm"   BIOPSY PROSTATE  2001 & 2003   CARDIAC CATHETERIZATION  1990's   CARDIOVERSION N/A 09/11/2017   Procedure: CARDIOVERSION;  Surgeon: Lewayne Bunting, MD;  [facility]: [facility];  Service: Cardiovascular;  Laterality: N/A;   FRACTURE SURGERY     HOLEP-LASER ENUCLEATION OF THE PROSTATE WITH MORCELLATION N/A 07/10/2020   Procedure: HOLEP-LASER ENUCLEATION OF THE PROSTATE WITH MORCELLATION;  Surgeon: Vanna Scotland, MD;  Location: [facility] ORS;  Service: Urology;  Laterality: N/A;   INGUINAL HERNIA REPAIR Left 2012   INSERT / REPLACE / REMOVE PACEMAKER  06/19/2016   LAPAROSCOPIC ABLATION RENAL MASS     LEFT HEART CATH AND CORONARY ANGIOGRAPHY Left 10/23/2017   Procedure: LEFT HEART CATH AND CORONARY ANGIOGRAPHY;  Surgeon: Iran Ouch, MD;  Location: [facility] INVASIVE CV LAB;  Service: Cardiovascular;  Laterality: Left;   LEFT HEART CATH AND CORONARY ANGIOGRAPHY Left 02/26/2021   Procedure: LEFT HEART CATH AND CORONARY ANGIOGRAPHY;  Surgeon: Iran Ouch,  MD;  Location: [facility] INVASIVE CV LAB;  Service: Cardiovascular;  Laterality: Left;   PACEMAKER IMPLANT N/A 06/19/2016   Procedure: Pacemaker Implant;  Surgeon: Duke Salvia, MD;  Location: [facility] INVASIVE CV LAB;  Service: Cardiovascular;  Laterality: N/A;   pacemasker     PROSTATE SURGERY     SQUAMOUS CELL CARCINOMA EXCISION     "face, nose left shoulder, left arm"   TONSILLECTOMY AND ADENOIDECTOMY      There were no vitals filed for this visit.   Subjective Assessment - 10/30/21 1507     Subjective Pt is noticing more sensation of sphincters and less leakage at night and with sitting and not lifting and working. Pt notices he is still not able to complete urination but strain 10-20% of the time.    Pertinent History  Lumbar imagining 05/09/19   "L2 compression deformity is again identified. L1 and L3 pedicle  screws are seen with posterior fixation identified. The overall  appearance is stable from the intraoperative exam. Mild degenerative  anterolisthesis of L4 on L5 is seen. Intraoperative spot films of the lumbar spine demonstrate initially  2 posterior spinal needles marking the T12 and L3 spinous processes."   HOLEP 07/2020                [facility] PT Assessment - 10/30/21 1514       Squat   Comments required cue for hip flexion      Posture/Postural Control   Posture Comments : 23  cm from earlobe to wall      Palpation   Spinal mobility tightness along paraspinals, medial scapula, upper trap/ scalenes, hypomobile C/ T junction                           [facility] Adult PT Treatment/Exercise - 10/30/21 1514       Therapeutic Activites    Other Therapeutic Activities reinforced importance of correcting slumped posture to promote more complete urination and anterior tilt of pelvis for upright spine,      Neuro Re-ed    Neuro Re-ed Details  cued for more hip flexion in squat      Manual Therapy   Manual therapy comments STM/MWM at problem areas noted in assessment to promote more cervical retraction                          PT Long Term Goals - 09/03/21 1421       PT LONG TERM GOAL #1   Title Pt will complete a chart detailing the method he uses for continence in relationship with activities across each day of the week in order to gather data for baseline    Time 2    Period Weeks    Status Achieved      PT LONG TERM GOAL #2   Title Pt will improve gait mechanics and posture  to increase walking endurance from 20 min to > 30 min in order to walk with less pain    Time 6    Period Weeks    Status Achieved      PT LONG TERM GOAL #3   Title Pt will demo less forward head posture from 30 cm from earlobe to wall to < 25 cm in order to achieve more upright  posture to optimize IAP system for postural stability ( less LBP) and urinary continence 12/19/20: 26 cm  03/05/21: 24 cm ,  05/14/21: 25 cm)  )    Time 8    Period Weeks    Status Achieved      PT LONG TERM GOAL #4   Title Pt will demo IND with deep core coordination and pelvic floor coordination without compensatory patterns to help with ADLs    Time 4    Period Weeks    Status Achieved      PT LONG TERM GOAL #5   Title Pt will demo less L thoracic shift, less convex curve at T/L junction and more reciprocal gait pattern in order to regain structural midline and to progress to pelvic floor contractions with better outcomes    Time 10    Period Weeks    Status Achieved      Additional Long Term Goals   Additional Long Term Goals Yes      PT LONG TERM GOAL #6   Title Pt will report decreased pain by 50% with Bending over, getting out of bed, reaching up overhead activities in order to perform his hobbies and household chores.    Time 8    Period Weeks    Status Achieved      PT LONG TERM GOAL #7   Title Pt will demo proper deep core coordination, pelvic floor contractions 3 sec, 3 reps in order to to minimize leakage while gardening    Baseline chest breathing ( limited anterior diaphragmatic breathing)    Time 8    Period Weeks    Status Achieved      PT LONG TERM GOAL #8   Title Pt will report getting the sensation of fullness of bladder and being able to empty completely across 2 weeks (05/14/21: still straining, 05/28/21: straining less but still has decreaased awareness of full bladder, 07/09/21: feeling of fullnes of bladder but straining to get the last 50% of the urine, 09/03/2021: straining to get 50% of the urine out  )    Time 6    Period Weeks    Status Partially Met    Target Date 10/05/21      PT LONG TERM GOAL  #9   TITLE Pt will demo increased hip abduction on L from 3+/5 to > 4/5 in order to improve pelvic girdle stability    Time 10    Period Weeks    Status  On-going    Target Date 11/02/21      PT LONG TERM GOAL  #10   TITLE Pt will demo proper coordination of pelvic floor and deep core mm in standing posture to urinate without straining    Baseline dyscoordination( pushing downward )    Time 8    Period Weeks    Status New    Target Date 10/29/21      PT LONG TERM GOAL  #11   TITLE Pt will report decreased leakage by 50% ( droplet wetness in pad) when sitting and talking with guests for 2 hours, nailing, taking out the trash    Baseline dampness in pad with light work    Time 10    Period Weeks    Status New    Target Date 11/12/21                   Plan - 10/30/21 1750     Clinical Impression Statement Pt continued to require manual Tx to address forward head posture. Excessive cues for upright sitting posture to minimize posterior tilt of pelvis which can help  with urinary Sx. Plan to perform re-cert at next session and assess pt's transfer from stand<> floor and provide more resistance training exercises to strengthen and promote anterior tilt of pelvis and less forward posture. Pt continues to benefit from skilled PT   Personal Factors and Comorbidities Comorbidity 3+    Comorbidities Lumbar fusion L1-3    Examination-Activity Limitations Toileting;Lift;Bed Mobility;Locomotion Level;Bend;Sit;Sleep;Continence;Squat;Stand;Transfers;Reach Overhead    Stability/Clinical Decision Making Evolving/Moderate complexity    Rehab Potential Good    PT Frequency 1x / week    PT Duration Other (comment)   10   PT Treatment/Interventions Moist Heat;Therapeutic activities;Therapeutic exercise;Patient/family education;Neuromuscular re-education;Stair training;Gait training;Manual techniques;Taping;Splinting;Dry needling;Balance training;ADLs/Self Care Home Management;Cryotherapy;Traction;Functional mobility training;Energy conservation;Joint Manipulations;Passive range of motion;Scar mobilization    Consulted and Agree with Plan of Care  Patient             Patient will benefit from skilled therapeutic intervention in order to improve the following deficits and impairments:  Decreased activity tolerance, Decreased endurance, Decreased range of motion, Decreased strength, Decreased coordination, Decreased mobility, Decreased scar mobility, Abnormal gait, Increased fascial restricitons, Impaired sensation, Improper body mechanics, Pain, Hypermobility, Increased muscle spasms, Hypomobility, Difficulty walking, Decreased knowledge of precautions, Decreased balance, Postural dysfunction, Cardiopulmonary status limiting activity, Decreased safety awareness  Visit Diagnosis: Abnormal posture  Sacrococcygeal disorders, not elsewhere classified  Other idiopathic scoliosis, thoracolumbar region  Other urinary incontinence  Other abnormalities of gait and mobility  Chronic low back pain without sciatica, unspecified back pain laterality     Problem List Patient Active Problem List   Diagnosis Date Noted   Closed fracture of second lumbar vertebra (HCC) 09/26/2021   Dysphagia, unspecified 09/26/2021   Heart block 09/26/2021   Heartburn 09/26/2021   History of lumbar fusion 09/26/2021   Inflammation of sacroiliac joint (HCC) 09/26/2021   Other symptoms and signs involving cognitive functions and awareness 09/26/2021   Prediabetes 09/26/2021   Low back pain 09/26/2021   Renal cell carcinoma (HCC) 09/26/2021   Essential hypertension 09/26/2021   Cerebrovascular disease 09/26/2021   Transient cerebral ischemic attack, unspecified 09/26/2021   Cerebrovascular disease, unspecified 09/26/2021   Transient ischemic attack 09/26/2021   Unstable angina (HCC)    Lumbar burst fracture (HCC) 05/04/2020   Hypogonadism male 10/10/2019   Persistent atrial fibrillation (HCC) 01/12/2019   Acute low back pain 11/04/2017   Abnormal screening cardiac CT    Visit for monitoring Tikosyn therapy 09/09/2017   History of stroke  08/29/2016   Paroxysmal atrial fibrillation (HCC) 07/23/2016   Coronary artery disease 07/23/2016   Snores 06/27/2016   Cardiac pacemaker in situ 06/25/2016   Hemiparesis (HCC) 06/25/2016   Acute ischemic stroke (HCC)    Acute left hemiparesis (HCC)    Hemisensory loss    Dysarthria    Stroke-like symptoms    Complete heart block (HCC)    Pain in thoracic spine    Orthostasis    Lethargy    Occlusion of vertebral artery    TIA (transient ischemic attack) 06/14/2016   Arthritis 02/06/2015   Esophageal reflux 02/06/2015   Arthritis urica 02/06/2015   Cannot sleep 02/06/2015   Arthritis, degenerative 02/06/2015   Adenocarcinoma, renal cell (HCC) 02/06/2015   Benign prostatic hyperplasia with lower urinary tract symptoms 11/21/2014   Personal history of other malignant neoplasm of kidney 11/21/2014   History of Lyme disease 05/06/2014   Palpitations 12/31/2013   Hyperlipidemia 11/02/2010   HYPERTENSION, BENIGN 04/16/2010    Mariane Masters, PT 10/30/2021, 5:50 PM  [place]  [facility] MAIN [facility] SERVICES [address] [place], [place], [address] Phone: [phone]   Fax:  [phone]  Name: Thomas [name] MRN: [id number] Date of Birth: 04/27/1933

## 2021-11-07 ENCOUNTER — Ambulatory Visit: Payer: Medicare Other | Attending: Urology | Admitting: Physical Therapy

## 2021-11-07 DIAGNOSIS — G8929 Other chronic pain: Secondary | ICD-10-CM | POA: Insufficient documentation

## 2021-11-07 DIAGNOSIS — R293 Abnormal posture: Secondary | ICD-10-CM | POA: Diagnosis not present

## 2021-11-07 DIAGNOSIS — M4125 Other idiopathic scoliosis, thoracolumbar region: Secondary | ICD-10-CM | POA: Diagnosis not present

## 2021-11-07 DIAGNOSIS — R2689 Other abnormalities of gait and mobility: Secondary | ICD-10-CM | POA: Diagnosis not present

## 2021-11-07 DIAGNOSIS — M545 Low back pain, unspecified: Secondary | ICD-10-CM | POA: Insufficient documentation

## 2021-11-07 DIAGNOSIS — M533 Sacrococcygeal disorders, not elsewhere classified: Secondary | ICD-10-CM | POA: Insufficient documentation

## 2021-11-07 DIAGNOSIS — N39498 Other specified urinary incontinence: Secondary | ICD-10-CM | POA: Insufficient documentation

## 2021-11-07 NOTE — Patient Instructions (Addendum)
Pat yourself on back/ reaching behind back with green band  Lengthening around shoulder blade /ribs, stretching opposite pect /front shoulder muscles:   R hand pats back of your band, elbow pointed up to ceiling/ holding band   Roll L shoulder back and down, grab bottom of green band, hold still   *Press back of L hand against back , inhale expand back,   Keep chin tucked, press head back as if wall is behind you, exhale, pull R hand up with band    10 reps on each side   ___  Sitting on blanket half folded to lift sitting bones up for anterior tilt of pelvis   ___
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
## 2021-11-13 ENCOUNTER — Ambulatory Visit: Payer: Medicare Other | Admitting: Physical Therapy

## 2021-11-14 DIAGNOSIS — M1812 Unilateral primary osteoarthritis of first carpometacarpal joint, left hand: Secondary | ICD-10-CM | POA: Diagnosis not present

## 2021-11-14 DIAGNOSIS — M180 Bilateral primary osteoarthritis of first carpometacarpal joints: Secondary | ICD-10-CM | POA: Diagnosis not present

## 2021-11-14 DIAGNOSIS — M1A09X0 Idiopathic chronic gout, multiple sites, without tophus (tophi): Secondary | ICD-10-CM | POA: Diagnosis not present

## 2021-11-20 ENCOUNTER — Encounter: Payer: Medicare Other | Admitting: Physical Therapy

## 2021-11-21 NOTE — Progress Notes (Signed)
Remote pacemaker transmission.   

## 2021-11-23 ENCOUNTER — Other Ambulatory Visit: Payer: Self-pay | Admitting: Family Medicine

## 2021-11-27 ENCOUNTER — Ambulatory Visit: Payer: Medicare Other | Admitting: Physical Therapy

## 2021-11-27 DIAGNOSIS — G8929 Other chronic pain: Secondary | ICD-10-CM | POA: Diagnosis not present

## 2021-11-27 DIAGNOSIS — R293 Abnormal posture: Secondary | ICD-10-CM

## 2021-11-27 DIAGNOSIS — M4125 Other idiopathic scoliosis, thoracolumbar region: Secondary | ICD-10-CM | POA: Diagnosis not present

## 2021-11-27 DIAGNOSIS — M533 Sacrococcygeal disorders, not elsewhere classified: Secondary | ICD-10-CM

## 2021-11-27 DIAGNOSIS — N39498 Other specified urinary incontinence: Secondary | ICD-10-CM

## 2021-11-27 DIAGNOSIS — R2689 Other abnormalities of gait and mobility: Secondary | ICD-10-CM

## 2021-11-27 DIAGNOSIS — M545 Low back pain, unspecified: Secondary | ICD-10-CM | POA: Diagnosis not present
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
## 2021-11-29 ENCOUNTER — Other Ambulatory Visit: Payer: Self-pay | Admitting: Internal Medicine

## 2021-12-04 ENCOUNTER — Encounter: Payer: Medicare Other | Admitting: Physical Therapy

## 2021-12-19 ENCOUNTER — Ambulatory Visit: Payer: Medicare Other | Attending: Urology | Admitting: Physical Therapy

## 2021-12-19 ENCOUNTER — Ambulatory Visit: Payer: Medicare Other | Admitting: Dermatology

## 2021-12-19 DIAGNOSIS — Z85828 Personal history of other malignant neoplasm of skin: Secondary | ICD-10-CM | POA: Diagnosis not present

## 2021-12-19 DIAGNOSIS — B353 Tinea pedis: Secondary | ICD-10-CM

## 2021-12-19 DIAGNOSIS — L578 Other skin changes due to chronic exposure to nonionizing radiation: Secondary | ICD-10-CM

## 2021-12-19 DIAGNOSIS — L814 Other melanin hyperpigmentation: Secondary | ICD-10-CM

## 2021-12-19 DIAGNOSIS — L821 Other seborrheic keratosis: Secondary | ICD-10-CM | POA: Diagnosis not present

## 2021-12-19 DIAGNOSIS — L820 Inflamed seborrheic keratosis: Secondary | ICD-10-CM | POA: Diagnosis not present

## 2021-12-19 DIAGNOSIS — D229 Melanocytic nevi, unspecified: Secondary | ICD-10-CM | POA: Diagnosis not present

## 2021-12-19 DIAGNOSIS — M545 Low back pain, unspecified: Secondary | ICD-10-CM | POA: Diagnosis not present

## 2021-12-19 DIAGNOSIS — G8929 Other chronic pain: Secondary | ICD-10-CM | POA: Diagnosis not present

## 2021-12-19 DIAGNOSIS — M4125 Other idiopathic scoliosis, thoracolumbar region: Secondary | ICD-10-CM | POA: Insufficient documentation

## 2021-12-19 DIAGNOSIS — R2689 Other abnormalities of gait and mobility: Secondary | ICD-10-CM | POA: Diagnosis not present

## 2021-12-19 DIAGNOSIS — L720 Epidermal cyst: Secondary | ICD-10-CM | POA: Diagnosis not present

## 2021-12-19 DIAGNOSIS — D1800 Hemangioma unspecified site: Secondary | ICD-10-CM | POA: Diagnosis not present

## 2021-12-19 DIAGNOSIS — R293 Abnormal posture: Secondary | ICD-10-CM | POA: Diagnosis not present

## 2021-12-19 DIAGNOSIS — M533 Sacrococcygeal disorders, not elsewhere classified: Secondary | ICD-10-CM | POA: Insufficient documentation

## 2021-12-19 DIAGNOSIS — Z1283 Encounter for screening for malignant neoplasm of skin: Secondary | ICD-10-CM

## 2021-12-19 DIAGNOSIS — N39498 Other specified urinary incontinence: Secondary | ICD-10-CM | POA: Diagnosis present

## 2021-12-19 MED ORDER — TERBINAFINE HCL 250 MG PO TABS: 250.0000 mg | ORAL_TABLET | Freq: Every day | ORAL | 2 refills | Status: DC
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
## 2021-12-19 NOTE — Progress Notes (Signed)
Follow-Up Visit   Subjective  Thomas [name] is a 88 y.o. male who presents for the following: Annual Exam (Mole check ). Hx of BCC, hx of SCC.  The patient presents for Total-Body Skin Exam (TBSE) for skin cancer screening and mole check.  The patient has spots, moles and lesions to be evaluated, some may be new or changing and the patient has concerns that these could be cancer.   The following portions of the chart were reviewed this encounter and updated as appropriate:   Tobacco  Allergies  Meds  Problems  Med Hx  Surg Hx  Fam Hx     Review of Systems:  No other skin or systemic complaints except as noted in HPI or Assessment and Plan.  Objective  Well appearing patient in no apparent distress; mood and affect are within normal limits.  A full examination was performed including scalp, head, eyes, ears, nose, lips, neck, chest, axillae, abdomen, back, buttocks, bilateral upper extremities, bilateral lower extremities, hands, feet, fingers, toes, fingernails, and toenails. All findings within normal limits unless otherwise noted below.  left forearm  x 1, right tricep x1, back x 4, left anterior shoulder x 1  (7) (7) Stuck-on, waxy, tan-brown papules--Discussed benign etiology and prognosis.   left lateral canthus Smooth white papule(s).   feet Scaling and maceration web spaces and over distal and lateral soles.    Assessment & Plan  Inflamed seborrheic keratosis (7) left forearm  x 1, right tricep x1, back x 4, left anterior shoulder x 1  (7) Symptomatic, irritating, patient would like treated.  Destruction of lesion - left forearm  x 1, right tricep x1, back x 4, left anterior shoulder x 1  (7) Complexity: simple   Destruction method: cryotherapy   Informed consent: discussed and consent obtained   Timeout:  patient name, date of birth, surgical site, and procedure verified Lesion destroyed using liquid nitrogen: Yes   Region frozen until ice ball extended beyond  lesion: Yes   Outcome: patient tolerated procedure well with no complications   Post-procedure details: wound care instructions given    Milia left lateral canthus Start sample of Arazlo cream apply at bedtime   Tinea pedis of both feet feet Chronic and persistent condition with duration or expected duration over one year. Condition is symptomatic / bothersome to patient. Not to goal - flared / recurrent  Start Terbinafine 250 mg tablet daily  Labs reviewed from 11/14/2021 WNL   Related Medications terbinafine (LAMISIL) 250 MG tablet Take 1 tablet (250 mg total) by mouth daily.  Lentigines - Scattered tan macules - Due to sun exposure - Benign-appearing, observe - Recommend daily broad spectrum sunscreen SPF 30+ to sun-exposed areas, reapply every 2 hours as needed. - Call for any changes  Seborrheic Keratoses - Stuck-on, waxy, tan-brown papules and/or plaques  - Benign-appearing - Discussed benign etiology and prognosis. - Observe - Call for any changes  Melanocytic Nevi - Tan-brown and/or pink-flesh-colored symmetric macules and papules - Benign appearing on exam today - Observation - Call clinic for new or changing moles - Recommend daily use of broad spectrum spf 30+ sunscreen to sun-exposed areas.   Hemangiomas - Red papules - Discussed benign nature - Observe - Call for any changes  Actinic Damage - Chronic condition, secondary to cumulative UV/sun exposure - diffuse scaly erythematous macules with underlying dyspigmentation - Recommend daily broad spectrum sunscreen SPF 30+ to sun-exposed areas, reapply every 2 hours as needed.  - Staying  in the shade or wearing long sleeves, sun glasses (UVA+UVB protection) and wide brim hats (4-inch brim around the entire circumference of the hat) are also recommended for sun protection.  - Call for new or changing lesions.  History of Basal Cell Carcinoma of the Skin Multiple see history - No evidence of recurrence  today - Recommend regular full body skin exams - Recommend daily broad spectrum sunscreen SPF 30+ to sun-exposed areas, reapply every 2 hours as needed.  - Call if any new or changing lesions are noted between office visits   History of Squamous Cell Carcinoma of the Skin Multiple see history - No evidence of recurrence today - No lymphadenopathy - Recommend regular full body skin exams - Recommend daily broad spectrum sunscreen SPF 30+ to sun-exposed areas, reapply every 2 hours as needed.  - Call if any new or changing lesions are noted between office visits  Skin cancer screening performed today.   Return in about 1 year (around 12/20/2022) for TBSE, hx of BCC, hx of SCC.  IMarye Round, CMA, am acting as scribe for Sarina Ser, MD .  Documentation: I have reviewed the above documentation for accuracy and completeness, and I agree with the above.  Sarina Ser, MD

## 2021-12-19 NOTE — Patient Instructions (Addendum)
Cryotherapy Aftercare  Wash gently with soap and water everyday.   Apply Vaseline and Band-Aid daily until healed.     Due to recent changes in healthcare laws, you may see results of your pathology and/or laboratory studies on MyChart before the doctors have had a chance to review them. We understand that in some cases there may be results that are confusing or concerning to you. Please understand that not all results are received at the same time and often the doctors may need to interpret multiple results in order to provide you with the best plan of care or course of treatment. Therefore, we ask that you please give us 2 business days to thoroughly review all your results before contacting the office for clarification. Should we see a critical lab result, you will be contacted sooner.   If You Need Anything After Your Visit  If you have any questions or concerns for your doctor, please call our main line at [phone] and press option 4 to reach your doctor's medical assistant. If no one answers, please leave a voicemail as directed and we will return your call as soon as possible. Messages left after 4 pm will be answered the following business day.   You may also send us a message via MyChart. We typically respond to MyChart messages within 1-2 business days.  For prescription refills, please ask your pharmacy to contact our office. Our fax number is [phone].  If you have an urgent issue when the clinic is closed that cannot wait until the next business day, you can page your doctor at the number below.    Please note that while we do our best to be available for urgent issues outside of office hours, we are not available 24/7.   If you have an urgent issue and are unable to reach us, you may choose to seek medical care at your doctor's office, retail clinic, urgent care center, or emergency room.  If you have a medical emergency, please immediately call 911 or go to the  emergency department.  Pager Numbers  - Dr. Kowalski: [phone]  - Dr. Moye: [phone]  - Dr. Stewart: [phone]  In the event of inclement weather, please call our main line at [phone] for an update on the status of any delays or closures.  Dermatology Medication Tips: Please keep the boxes that topical medications come in in order to help keep track of the instructions about where and how to use these. Pharmacies typically print the medication instructions only on the boxes and not directly on the medication tubes.   If your medication is too expensive, please contact our office at [phone] option 4 or send us a message through MyChart.   We are unable to tell what your co-pay for medications will be in advance as this is different depending on your insurance coverage. However, we may be able to find a substitute medication at lower cost or fill out paperwork to get insurance to cover a needed medication.   If a prior authorization is required to get your medication covered by your insurance company, please allow us 1-2 business days to complete this process.  Drug prices often vary depending on where the prescription is filled and some pharmacies may offer cheaper prices.  The website www.goodrx.com contains coupons for medications through different pharmacies. The prices here do not account for what the cost may be with help from insurance (it may be cheaper with your insurance), but the website can   give you the price if you did not use any insurance.  - You can print the associated coupon and take it with your prescription to the pharmacy.  - You may also stop by our office during regular business hours and pick up a GoodRx coupon card.  - If you need your prescription sent electronically to a different pharmacy, notify our office through [facility] MyChart or by phone at [phone] option 4.     Si Usted Necesita Algo Despus de Su Visita  Tambin puede  enviarnos un mensaje a travs de MyChart. Por lo general respondemos a los mensajes de MyChart en el transcurso de 1 a 2 das hbiles.  Para renovar recetas, por favor pida a su farmacia que se ponga en contacto con nuestra oficina. Nuestro nmero de fax es el [phone].  Si tiene un asunto urgente cuando la clnica est cerrada y que no puede esperar hasta el siguiente da hbil, puede llamar/localizar a su doctor(a) al nmero que aparece a continuacin.   Por favor, tenga en cuenta que aunque hacemos todo lo posible para estar disponibles para asuntos urgentes fuera del horario de oficina, no estamos disponibles las 24 horas del da, los 7 das de la semana.   Si tiene un problema urgente y no puede comunicarse con nosotros, puede optar por buscar atencin mdica  en el consultorio de su doctor(a), en una clnica privada, en un centro de atencin urgente o en una sala de emergencias.  Si tiene una emergencia mdica, por favor llame inmediatamente al 911 o vaya a la sala de emergencias.  Nmeros de bper  - Dr. Kowalski: [phone]  - Dra. Moye: [phone]  - Dra. Stewart: [phone]  En caso de inclemencias del tiempo, por favor llame a nuestra lnea principal al [phone] para una actualizacin sobre el estado de cualquier retraso o cierre.  Consejos para la medicacin en dermatologa: Por favor, guarde las cajas en las que vienen los medicamentos de uso tpico para ayudarle a seguir las instrucciones sobre dnde y cmo usarlos. Las farmacias generalmente imprimen las instrucciones del medicamento slo en las cajas y no directamente en los tubos del medicamento.   Si su medicamento es muy caro, por favor, pngase en contacto con nuestra oficina llamando al [phone] y presione la opcin 4 o envenos un mensaje a travs de MyChart.   No podemos decirle cul ser su copago por los medicamentos por adelantado ya que esto es diferente dependiendo de la cobertura de su seguro.  Sin embargo, es posible que podamos encontrar un medicamento sustituto a menor costo o llenar un formulario para que el seguro cubra el medicamento que se considera necesario.   Si se requiere una autorizacin previa para que su compaa de seguros cubra su medicamento, por favor permtanos de 1 a 2 das hbiles para completar este proceso.  Los precios de los medicamentos varan con frecuencia dependiendo del lugar de dnde se surte la receta y alguna farmacias pueden ofrecer precios ms baratos.  El sitio web www.goodrx.com tiene cupones para medicamentos de diferentes farmacias. Los precios aqu no tienen en cuenta lo que podra costar con la ayuda del seguro (puede ser ms barato con su seguro), pero el sitio web puede darle el precio si no utiliz ningn seguro.  - Puede imprimir el cupn correspondiente y llevarlo con su receta a la farmacia.  - Tambin puede pasar por nuestra oficina durante el horario de atencin regular y recoger una tarjeta de cupones de GoodRx.  -   Si necesita que su receta se enve electrnicamente a una farmacia diferente, informe a nuestra oficina a travs de MyChart de [facility] o por telfono llamando al [phone] y presione la opcin 4.  

## 2021-12-20 NOTE — Patient Instructions (Signed)
Wall chin tuck and arm press

## 2021-12-25 ENCOUNTER — Encounter: Payer: Self-pay | Admitting: Dermatology

## 2022-01-02 ENCOUNTER — Ambulatory Visit: Payer: Medicare Other | Admitting: Physical Therapy

## 2022-01-02 DIAGNOSIS — M533 Sacrococcygeal disorders, not elsewhere classified: Secondary | ICD-10-CM | POA: Diagnosis not present

## 2022-01-02 DIAGNOSIS — N39498 Other specified urinary incontinence: Secondary | ICD-10-CM

## 2022-01-02 DIAGNOSIS — G8929 Other chronic pain: Secondary | ICD-10-CM | POA: Diagnosis not present

## 2022-01-02 DIAGNOSIS — M545 Low back pain, unspecified: Secondary | ICD-10-CM | POA: Diagnosis not present

## 2022-01-02 DIAGNOSIS — R2689 Other abnormalities of gait and mobility: Secondary | ICD-10-CM | POA: Diagnosis not present

## 2022-01-02 DIAGNOSIS — R293 Abnormal posture: Secondary | ICD-10-CM | POA: Diagnosis not present

## 2022-01-02 DIAGNOSIS — M4125 Other idiopathic scoliosis, thoracolumbar region: Secondary | ICD-10-CM | POA: Diagnosis not present

## 2022-01-02 NOTE — Patient Instructions (Signed)
Advance the lunge arm stretch by the wall with higher hand on the wall and sweep back to lengthening the intercostals   __  Modified Birddog  Table top position,  6 points of contact: paw hands, knees hip width apart, toes tucked under  Shoulders down and back like squeezing armpits  Not sagging back   L arm up , thumbs up, arm is out like a half"V" shoulder blade slides down and back  R knee straight, toes tucked on the ground,  Lengthen whole spine as if yard stick is balanced on spine, chin tucked   L + R = 1 rep 10 reps   X 2    ___   Transition from standing to floor :  stand to floor transfer :      _ slow     _ mini squat      _ crawl down with one hand on thigh      _downward dog  - >  shoulders down and back-  walk the dog ( knee bents to lengthe hamstrings)      Floor to stand :   downward dog   Feet are wider than hips, crawl hands back, butt is back, knees behind toes -> squat  Hands at waist , elbows back, chest lifts
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
## 2022-01-09 ENCOUNTER — Ambulatory Visit (INDEPENDENT_AMBULATORY_CARE_PROVIDER_SITE_OTHER): Payer: Medicare Other | Admitting: Family Medicine

## 2022-01-09 ENCOUNTER — Encounter: Payer: Self-pay | Admitting: Family Medicine

## 2022-01-09 VITALS — BP 132/69 | HR 67 | Temp 97.4°F | Resp 16 | Ht 67.0 in | Wt 161.0 lb

## 2022-01-09 DIAGNOSIS — I4819 Other persistent atrial fibrillation: Secondary | ICD-10-CM

## 2022-01-09 DIAGNOSIS — K219 Gastro-esophageal reflux disease without esophagitis: Secondary | ICD-10-CM

## 2022-01-09 DIAGNOSIS — I2510 Atherosclerotic heart disease of native coronary artery without angina pectoris: Secondary | ICD-10-CM | POA: Diagnosis not present

## 2022-01-09 DIAGNOSIS — E782 Mixed hyperlipidemia: Secondary | ICD-10-CM | POA: Diagnosis not present

## 2022-01-09 DIAGNOSIS — R739 Hyperglycemia, unspecified: Secondary | ICD-10-CM | POA: Diagnosis not present

## 2022-01-09 DIAGNOSIS — C642 Malignant neoplasm of left kidney, except renal pelvis: Secondary | ICD-10-CM | POA: Diagnosis not present

## 2022-01-09 DIAGNOSIS — Z0001 Encounter for general adult medical examination with abnormal findings: Secondary | ICD-10-CM | POA: Diagnosis not present

## 2022-01-09 DIAGNOSIS — Z8619 Personal history of other infectious and parasitic diseases: Secondary | ICD-10-CM | POA: Diagnosis not present

## 2022-01-09 DIAGNOSIS — E291 Testicular hypofunction: Secondary | ICD-10-CM

## 2022-01-09 DIAGNOSIS — I10 Essential (primary) hypertension: Secondary | ICD-10-CM | POA: Diagnosis not present

## 2022-01-09 DIAGNOSIS — Z981 Arthrodesis status: Secondary | ICD-10-CM

## 2022-01-09 DIAGNOSIS — Z950 Presence of cardiac pacemaker: Secondary | ICD-10-CM

## 2022-01-09 DIAGNOSIS — Z0000 Encounter for general adult medical examination without abnormal findings: Secondary | ICD-10-CM
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
## 2022-01-10 LAB — COMPREHENSIVE METABOLIC PANEL
ALT: 15 IU/L (ref 0–44)
AST: 21 IU/L (ref 0–40)
Albumin/Globulin Ratio: 2 (ref 1.2–2.2)
Albumin: 4.5 g/dL (ref 3.7–4.7)
Alkaline Phosphatase: 75 IU/L (ref 44–121)
BUN/Creatinine Ratio: 14 (ref 10–24)
BUN: 20 mg/dL (ref 8–27)
Bilirubin Total: 0.5 mg/dL (ref 0.0–1.2)
CO2: 24 mmol/L (ref 20–29)
Calcium: 9.4 mg/dL (ref 8.6–10.2)
Chloride: 99 mmol/L (ref 96–106)
Creatinine, Ser: 1.39 mg/dL — ABNORMAL HIGH (ref 0.76–1.27)
Globulin, Total: 2.3 g/dL (ref 1.5–4.5)
Glucose: 89 mg/dL (ref 70–99)
Potassium: 4.4 mmol/L (ref 3.5–5.2)
Sodium: 134 mmol/L (ref 134–144)
Total Protein: 6.8 g/dL (ref 6.0–8.5)
eGFR: 49 mL/min/{1.73_m2} — ABNORMAL LOW (ref >59)

## 2022-01-10 LAB — TSH: TSH: 2.42 u[IU]/mL (ref 0.450–4.500)

## 2022-01-10 LAB — CBC WITH DIFFERENTIAL/PLATELET
Basophils Absolute: 0 10*3/uL (ref 0.0–0.2)
Basos: 1 %
EOS (ABSOLUTE): 0.1 10*3/uL (ref 0.0–0.4)
Eos: 2 %
Hematocrit: 44.3 % (ref 37.5–51.0)
Hemoglobin: 14.5 g/dL (ref 13.0–17.7)
Immature Grans (Abs): 0 10*3/uL (ref 0.0–0.1)
Immature Granulocytes: 0 %
Lymphocytes Absolute: 1.9 10*3/uL (ref 0.7–3.1)
Lymphs: 30 %
MCH: 29.5 pg (ref 26.6–33.0)
MCHC: 32.7 g/dL (ref 31.5–35.7)
MCV: 90 fL (ref 79–97)
Monocytes Absolute: 0.9 10*3/uL (ref 0.1–0.9)
Monocytes: 14 %
Neutrophils Absolute: 3.3 10*3/uL (ref 1.4–7.0)
Neutrophils: 53 %
Platelets: 176 10*3/uL (ref 150–450)
RBC: 4.91 x10E6/uL (ref 4.14–5.80)
RDW: 13.8 % (ref 11.6–15.4)
WBC: 6.1 10*3/uL (ref 3.4–10.8)

## 2022-01-10 LAB — LIPID PANEL
Chol/HDL Ratio: 2 ratio (ref 0.0–5.0)
Cholesterol, Total: 133 mg/dL (ref 100–199)
HDL: 67 mg/dL (ref >39)
LDL Chol Calc (NIH): 56 mg/dL (ref 0–99)
Triglycerides: 39 mg/dL (ref 0–149)
VLDL Cholesterol Cal: 10 mg/dL (ref 5–40)

## 2022-01-10 LAB — HEMOGLOBIN A1C
Est. average glucose Bld gHb Est-mCnc: 117 mg/dL
Hgb A1c MFr Bld: 5.7 % — ABNORMAL HIGH (ref 4.8–5.6)

## 2022-01-16 ENCOUNTER — Ambulatory Visit: Payer: Medicare Other | Attending: Urology | Admitting: Physical Therapy

## 2022-01-16 DIAGNOSIS — R2689 Other abnormalities of gait and mobility: Secondary | ICD-10-CM | POA: Diagnosis present

## 2022-01-16 DIAGNOSIS — M533 Sacrococcygeal disorders, not elsewhere classified: Secondary | ICD-10-CM | POA: Insufficient documentation

## 2022-01-16 DIAGNOSIS — G8929 Other chronic pain: Secondary | ICD-10-CM | POA: Diagnosis present

## 2022-01-16 DIAGNOSIS — R293 Abnormal posture: Secondary | ICD-10-CM | POA: Diagnosis present

## 2022-01-16 DIAGNOSIS — N39498 Other specified urinary incontinence: Secondary | ICD-10-CM | POA: Insufficient documentation

## 2022-01-16 DIAGNOSIS — M4125 Other idiopathic scoliosis, thoracolumbar region: Secondary | ICD-10-CM | POA: Insufficient documentation

## 2022-01-16 DIAGNOSIS — M545 Low back pain, unspecified: Secondary | ICD-10-CM | POA: Insufficient documentation
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
## 2022-01-16 NOTE — Patient Instructions (Addendum)
Safe stepping on and off trampoline with B both hands on wall and sidestepping .  Trampoline placed in a corner of room if possible with door   marching w/  black band at door knob ( face away from door)   2 min with 2 min rest x 3 reps for cardio  Plant feet hip width apart and land on ballmounds when marching  __   Letter T, seeasaw on one leg with band band under L foot, wrap by big toe then, outer knee/ thigh, L hand pulling band with thumb out( elbow by side)  Plant ballmound, toes spread, thigh out against band,, tracking knee in line with 2-3rd toe line Navel over knee , knee over ballmound  Dipping forward, R foot lifts slight ( whole body like a see saw) off floor or  Press back foot against wall 20 reps  Both

## 2022-01-29 ENCOUNTER — Encounter: Payer: Medicare Other | Admitting: Family Medicine

## 2022-01-29 ENCOUNTER — Ambulatory Visit (INDEPENDENT_AMBULATORY_CARE_PROVIDER_SITE_OTHER): Payer: Medicare Other

## 2022-01-29 DIAGNOSIS — I442 Atrioventricular block, complete: Secondary | ICD-10-CM | POA: Diagnosis not present

## 2022-01-30 ENCOUNTER — Encounter: Payer: Medicare Other | Admitting: Physical Therapy

## 2022-01-30 LAB — CUP PACEART REMOTE DEVICE CHECK
Battery Remaining Longevity: 43 mo
Battery Voltage: 2.95 V
Brady Statistic AP VP Percent: 97.23 %
Brady Statistic AP VS Percent: 0 %
Brady Statistic AS VP Percent: 2.75 %
Brady Statistic AS VS Percent: 0.02 %
Brady Statistic RA Percent Paced: 97.17 %
Brady Statistic RV Percent Paced: 99.98 %
Date Time Interrogation Session: 20230926020522
Implantable Lead Implant Date: 20180214
Implantable Lead Implant Date: 20180214
Implantable Lead Location: 753859
Implantable Lead Location: 753860
Implantable Lead Model: 5076
Implantable Lead Model: 5076
Implantable Pulse Generator Implant Date: 20180214
Lead Channel Impedance Value: 285 Ohm
Lead Channel Impedance Value: 342 Ohm
Lead Channel Impedance Value: 361 Ohm
Lead Channel Impedance Value: 399 Ohm
Lead Channel Pacing Threshold Amplitude: 0.5 V
Lead Channel Pacing Threshold Amplitude: 0.875 V
Lead Channel Pacing Threshold Pulse Width: 0.4 ms
Lead Channel Pacing Threshold Pulse Width: 0.4 ms
Lead Channel Sensing Intrinsic Amplitude: 2.75 mV
Lead Channel Sensing Intrinsic Amplitude: 2.75 mV
Lead Channel Sensing Intrinsic Amplitude: 4.625 mV
Lead Channel Sensing Intrinsic Amplitude: 4.625 mV
Lead Channel Setting Pacing Amplitude: 2 V
Lead Channel Setting Pacing Amplitude: 2.5 V
Lead Channel Setting Pacing Pulse Width: 0.4 ms
Lead Channel Setting Sensing Sensitivity: 2 mV

## 2022-02-06 ENCOUNTER — Ambulatory Visit: Payer: Medicare Other | Attending: Urology | Admitting: Physical Therapy

## 2022-02-06 DIAGNOSIS — M545 Low back pain, unspecified: Secondary | ICD-10-CM | POA: Diagnosis present

## 2022-02-06 DIAGNOSIS — M533 Sacrococcygeal disorders, not elsewhere classified: Secondary | ICD-10-CM | POA: Insufficient documentation

## 2022-02-06 DIAGNOSIS — M4125 Other idiopathic scoliosis, thoracolumbar region: Secondary | ICD-10-CM | POA: Insufficient documentation

## 2022-02-06 DIAGNOSIS — N39498 Other specified urinary incontinence: Secondary | ICD-10-CM | POA: Diagnosis present

## 2022-02-06 DIAGNOSIS — R2689 Other abnormalities of gait and mobility: Secondary | ICD-10-CM | POA: Insufficient documentation

## 2022-02-06 DIAGNOSIS — G8929 Other chronic pain: Secondary | ICD-10-CM | POA: Diagnosis present

## 2022-02-06 DIAGNOSIS — R293 Abnormal posture: Secondary | ICD-10-CM | POA: Diagnosis present
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
## 2022-02-06 NOTE — Patient Instructions (Signed)
BRIDGES  Level 1:  Position:  Elbows bent, knees hip width apart, heels under knees   Stabilization points: shoulders, upper arms, back of head pressed into floor. Heel press downward.   Movement: inhale do nothing, exhale press fists/arm  to floor completely while lifting hips.Keep stabilization points engaged when you allow the band to go back to starting position  10 reps     Level 2   Elbows straight, arms raised to ceiling at shoulder height, knees apart like a ballerina,heels together, heels under knees  Stabilization points: shoulders, upper arms, back of head pressed into floor. Heel press downward.   Movement: inhale do nothing, exhale press fists/arm lifting hips.   Keep stabilization points engaged when you allow the band to go back to starting position  10 reps

## 2022-02-07 NOTE — Progress Notes (Signed)
Remote pacemaker transmission.   

## 2022-02-18 ENCOUNTER — Other Ambulatory Visit: Payer: Self-pay | Admitting: Urology

## 2022-02-19 ENCOUNTER — Other Ambulatory Visit: Payer: Self-pay | Admitting: Family Medicine

## 2022-02-19 ENCOUNTER — Ambulatory Visit: Payer: Medicare Other | Admitting: Physical Therapy

## 2022-02-19 ENCOUNTER — Other Ambulatory Visit: Payer: Self-pay | Admitting: Internal Medicine

## 2022-02-19 DIAGNOSIS — M4125 Other idiopathic scoliosis, thoracolumbar region: Secondary | ICD-10-CM

## 2022-02-19 DIAGNOSIS — M533 Sacrococcygeal disorders, not elsewhere classified: Secondary | ICD-10-CM

## 2022-02-19 DIAGNOSIS — M545 Low back pain, unspecified: Secondary | ICD-10-CM

## 2022-02-19 DIAGNOSIS — R2689 Other abnormalities of gait and mobility: Secondary | ICD-10-CM

## 2022-02-19 DIAGNOSIS — N39498 Other specified urinary incontinence: Secondary | ICD-10-CM

## 2022-02-19 DIAGNOSIS — R293 Abnormal posture: Secondary | ICD-10-CM
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
## 2022-02-19 NOTE — Telephone Encounter (Signed)
Requested medication (s) are due for refill today: yes  Requested medication (s) are on the active medication list: yes    Last refill: 05/15/21  #90  0 refills  Future visit scheduled no  Notes to clinic:Pt of Dr. Rosanna Randy. Has new PCP, appt 03/25/22 with Dr. Ouida Sills at [facility].  Requested Prescriptions  Pending Prescriptions Disp Refills   amLODipine (NORVASC) 5 MG tablet [Pharmacy Med Name: AMLODIPINE BESYLATE 5 MG TAB] 90 tablet 0    Sig: TAKE 1 TABLET BY MOUTH DAILY     Cardiovascular: Calcium Channel Blockers 2 Passed - 02/19/2022  8:49 AM      Passed - Last BP in normal range    BP Readings from Last 1 Encounters:  01/09/22 132/69         Passed - Last Heart Rate in normal range    Pulse Readings from Last 1 Encounters:  01/09/22 67         Passed - Valid encounter within last 6 months    Recent Outpatient Visits           1 month ago Annual physical exam   [facility] Jerrol Banana., MD   6 months ago Essential hypertension   [facility] Jerrol Banana., MD   11 months ago Essential hypertension   [facility] Jerrol Banana., MD   1 year ago Annual physical exam   [facility] Jerrol Banana., MD   1 year ago Dehydration   [facility] Jerrol Banana., MD       Future Appointments             In 10 months Ralene Bathe, MD [place]

## 2022-02-19 NOTE — Telephone Encounter (Signed)
Good Morning,  Could you please schedule an overdue 6 month follow up with Dr. Caryl Comes? He was last seen by Dr. Caryl Comes 05/2021. Thank you so much.

## 2022-02-20 ENCOUNTER — Ambulatory Visit: Payer: Medicare Other | Admitting: Physical Therapy

## 2022-02-20 ENCOUNTER — Other Ambulatory Visit: Payer: Self-pay | Admitting: Internal Medicine

## 2022-02-20 DIAGNOSIS — R293 Abnormal posture: Secondary | ICD-10-CM

## 2022-02-20 DIAGNOSIS — M4125 Other idiopathic scoliosis, thoracolumbar region: Secondary | ICD-10-CM | POA: Diagnosis not present

## 2022-02-20 DIAGNOSIS — N39498 Other specified urinary incontinence: Secondary | ICD-10-CM

## 2022-02-20 DIAGNOSIS — R2689 Other abnormalities of gait and mobility: Secondary | ICD-10-CM

## 2022-02-20 DIAGNOSIS — M533 Sacrococcygeal disorders, not elsewhere classified: Secondary | ICD-10-CM

## 2022-02-20 DIAGNOSIS — G8929 Other chronic pain: Secondary | ICD-10-CM

## 2022-02-20 NOTE — Patient Instructions (Signed)
Wall lean for scoliosis   Forearm slide against wall as you stand perpendicular to wall,feet hip width apart,  opposite hand pulls band without letting elbow move away from side body  And slide forearm against the door up and down  Tilt trunk away from door but not away from hip   Make sure the upper trapezius muscle does not hike up to ear as band is being pulled as you lean towards wall    band  20  Leaning R  only to open the  L flank area
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
## 2022-02-21 NOTE — Telephone Encounter (Signed)
Please schedule overdue 6 month F/U appt with Dr. Caryl Comes. Thank you!

## 2022-02-26 DIAGNOSIS — I495 Sick sinus syndrome: Secondary | ICD-10-CM | POA: Insufficient documentation

## 2022-02-27 ENCOUNTER — Encounter: Payer: Self-pay | Admitting: Internal Medicine

## 2022-02-27 ENCOUNTER — Ambulatory Visit: Payer: Medicare Other | Admitting: Physical Therapy

## 2022-02-27 ENCOUNTER — Ambulatory Visit: Payer: Medicare Other | Attending: Internal Medicine | Admitting: Internal Medicine

## 2022-02-27 VITALS — BP 116/65 | HR 63 | Ht 67.0 in | Wt 162.8 lb

## 2022-02-27 DIAGNOSIS — R293 Abnormal posture: Secondary | ICD-10-CM

## 2022-02-27 DIAGNOSIS — I4819 Other persistent atrial fibrillation: Secondary | ICD-10-CM | POA: Diagnosis not present

## 2022-02-27 DIAGNOSIS — M545 Low back pain, unspecified: Secondary | ICD-10-CM

## 2022-02-27 DIAGNOSIS — G459 Transient cerebral ischemic attack, unspecified: Secondary | ICD-10-CM

## 2022-02-27 DIAGNOSIS — N39498 Other specified urinary incontinence: Secondary | ICD-10-CM

## 2022-02-27 DIAGNOSIS — I495 Sick sinus syndrome: Secondary | ICD-10-CM

## 2022-02-27 DIAGNOSIS — R2689 Other abnormalities of gait and mobility: Secondary | ICD-10-CM

## 2022-02-27 DIAGNOSIS — I442 Atrioventricular block, complete: Secondary | ICD-10-CM

## 2022-02-27 DIAGNOSIS — Z950 Presence of cardiac pacemaker: Secondary | ICD-10-CM

## 2022-02-27 DIAGNOSIS — M533 Sacrococcygeal disorders, not elsewhere classified: Secondary | ICD-10-CM

## 2022-02-27 DIAGNOSIS — M4125 Other idiopathic scoliosis, thoracolumbar region: Secondary | ICD-10-CM | POA: Diagnosis not present

## 2022-02-27 MED ORDER — METOPROLOL SUCCINATE ER 50 MG PO TB24: ORAL_TABLET | ORAL | 3 refills | Status: DC

## 2022-02-27 MED ORDER — PROPRANOLOL HCL 40 MG PO TABS: 40.0000 mg | ORAL_TABLET | Freq: Three times a day (TID) | ORAL | 1 refills | Status: DC

## 2022-02-27 NOTE — Patient Instructions (Signed)
Medication Instructions:  Your physician recommends that you continue on your current medications as directed. Please refer to the Current Medication list given to you today.  *If you need a refill on your cardiac medications before your next appointment, please call your pharmacy*   Lab Work: None ordered.  If you have labs (blood work) drawn today and your tests are completely normal, you will receive your results only by: MyChart Message (if you have MyChart) OR A paper copy in the mail If you have any lab test that is abnormal or we need to change your treatment, we will call you to review the results.   Testing/Procedures: None ordered.    Follow-Up: At [facility], you and your health needs are our priority.  As part of our continuing mission to provide you with exceptional heart care, we have created designated Provider Care Teams.  These Care Teams include your primary Cardiologist (physician) and Advanced Practice Providers (APPs -  Physician Assistants and Nurse Practitioners) who all work together to provide you with the care you need, when you need it.  We recommend signing up for the patient portal called "MyChart".  Sign up information is provided on this After Visit Summary.  MyChart is used to connect with patients for Virtual Visits (Telemedicine).  Patients are able to view lab/test results, encounter notes, upcoming appointments, etc.  Non-urgent messages can be sent to your provider as well.   To learn more about what you can do with MyChart, go to [email or web address].    Your next appointment:   6 months with Dr Klein  Important Information About Sugar       
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
## 2022-02-27 NOTE — Patient Instructions (Addendum)
Pelvic floor program:  MORNING AND NIGHT    LONG HOLDS  Inhale . Exhale, count aloud to  7 sec hold, 3 rest breaths    X 5 reps   ___  Deep core level 1   Deep core level 2 6 min   ____  LONG HOLDS  Inhale . Exhale, count aloud to  7 sec hold, 3 rest breaths    X 5 reps   ______________________   Breakfast, lunch, dinner   QUICK HOLDS  Hands on chair by hips to support posture  No breathing up to chest, think 360 breath at ribs    In hale, exhale, quick squeeze,   X 7 reps   ___________  Added   Scoliosis : 1)  : L hand on counter: only  Elbow by side  Modified side plank Leaning hips , elbow by ribs , shoulder down 20  reps   2) back diagonal kick on both legs,   20 reps     3)   Tip the teapot   Wall lean for scoliosis   Forearm slide against wall as you stand perpendicular to wall,feet hip width apart,  opposite hand pulls band without letting elbow move away from side body  And slide forearm against the door up and down  Tilt trunk away from door but not away from hip   Make sure the upper trapezius muscle does not hike up to ear as band is being pulled as you lean towards wall    Green band  20  Leaning _ only to open the L _ flank area

## 2022-02-28 LAB — CUP PACEART INCLINIC DEVICE CHECK
Battery Remaining Longevity: 42 mo
Battery Voltage: 2.94 V
Brady Statistic AP VP Percent: 96.01 %
Brady Statistic AP VS Percent: 0 %
Brady Statistic AS VP Percent: 3.96 %
Brady Statistic AS VS Percent: 0.02 %
Brady Statistic RA Percent Paced: 95.5 %
Brady Statistic RV Percent Paced: 99.97 %
Date Time Interrogation Session: 20231025140600
Implantable Lead Connection Status: 753985
Implantable Lead Connection Status: 753985
Implantable Lead Implant Date: 20180214
Implantable Lead Implant Date: 20180214
Implantable Lead Location: 753859
Implantable Lead Location: 753860
Implantable Lead Model: 5076
Implantable Lead Model: 5076
Implantable Pulse Generator Implant Date: 20180214
Lead Channel Impedance Value: 304 Ohm
Lead Channel Impedance Value: 342 Ohm
Lead Channel Impedance Value: 380 Ohm
Lead Channel Impedance Value: 418 Ohm
Lead Channel Pacing Threshold Amplitude: 0.5 V
Lead Channel Pacing Threshold Amplitude: 0.875 V
Lead Channel Pacing Threshold Amplitude: 1 V
Lead Channel Pacing Threshold Pulse Width: 0.4 ms
Lead Channel Pacing Threshold Pulse Width: 0.4 ms
Lead Channel Pacing Threshold Pulse Width: 0.4 ms
Lead Channel Sensing Intrinsic Amplitude: 2.5 mV
Lead Channel Sensing Intrinsic Amplitude: 3 mV
Lead Channel Sensing Intrinsic Amplitude: 4.625 mV
Lead Channel Sensing Intrinsic Amplitude: 4.625 mV
Lead Channel Setting Pacing Amplitude: 2 V
Lead Channel Setting Pacing Amplitude: 2.5 V
Lead Channel Setting Pacing Pulse Width: 0.4 ms
Lead Channel Setting Sensing Sensitivity: 2 mV
Zone Setting Status: 755011
Zone Setting Status: 755011

## 2022-03-06 ENCOUNTER — Encounter: Payer: Medicare Other | Admitting: Physical Therapy

## 2022-03-15 ENCOUNTER — Other Ambulatory Visit (HOSPITAL_BASED_OUTPATIENT_CLINIC_OR_DEPARTMENT_OTHER): Payer: Self-pay

## 2022-04-03 ENCOUNTER — Ambulatory Visit: Payer: Medicare Other | Attending: Urology | Admitting: Physical Therapy

## 2022-04-03 DIAGNOSIS — G8929 Other chronic pain: Secondary | ICD-10-CM | POA: Insufficient documentation

## 2022-04-03 DIAGNOSIS — N39498 Other specified urinary incontinence: Secondary | ICD-10-CM | POA: Diagnosis present

## 2022-04-03 DIAGNOSIS — M545 Low back pain, unspecified: Secondary | ICD-10-CM | POA: Insufficient documentation

## 2022-04-03 DIAGNOSIS — M533 Sacrococcygeal disorders, not elsewhere classified: Secondary | ICD-10-CM | POA: Insufficient documentation

## 2022-04-03 DIAGNOSIS — R293 Abnormal posture: Secondary | ICD-10-CM | POA: Insufficient documentation

## 2022-04-03 DIAGNOSIS — R2689 Other abnormalities of gait and mobility: Secondary | ICD-10-CM | POA: Diagnosis present

## 2022-04-03 DIAGNOSIS — M4125 Other idiopathic scoliosis, thoracolumbar region: Secondary | ICD-10-CM | POA: Insufficient documentation
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
## 2022-04-10 ENCOUNTER — Ambulatory Visit: Payer: Medicare Other | Attending: Urology | Admitting: Physical Therapy

## 2022-04-10 DIAGNOSIS — R2689 Other abnormalities of gait and mobility: Secondary | ICD-10-CM | POA: Diagnosis present

## 2022-04-10 DIAGNOSIS — M533 Sacrococcygeal disorders, not elsewhere classified: Secondary | ICD-10-CM | POA: Insufficient documentation

## 2022-04-10 DIAGNOSIS — N39498 Other specified urinary incontinence: Secondary | ICD-10-CM | POA: Insufficient documentation

## 2022-04-10 DIAGNOSIS — R293 Abnormal posture: Secondary | ICD-10-CM | POA: Insufficient documentation

## 2022-04-10 DIAGNOSIS — G8929 Other chronic pain: Secondary | ICD-10-CM | POA: Diagnosis present

## 2022-04-10 DIAGNOSIS — M545 Low back pain, unspecified: Secondary | ICD-10-CM | POA: Diagnosis present

## 2022-04-10 DIAGNOSIS — M4125 Other idiopathic scoliosis, thoracolumbar region: Secondary | ICD-10-CM | POA: Insufficient documentation
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
## 2022-04-16 ENCOUNTER — Ambulatory Visit: Payer: Medicare Other | Admitting: Physical Therapy

## 2022-04-16 DIAGNOSIS — N39498 Other specified urinary incontinence: Secondary | ICD-10-CM

## 2022-04-16 DIAGNOSIS — M533 Sacrococcygeal disorders, not elsewhere classified: Secondary | ICD-10-CM

## 2022-04-16 DIAGNOSIS — R293 Abnormal posture: Secondary | ICD-10-CM

## 2022-04-16 DIAGNOSIS — G8929 Other chronic pain: Secondary | ICD-10-CM

## 2022-04-16 DIAGNOSIS — R2689 Other abnormalities of gait and mobility: Secondary | ICD-10-CM

## 2022-04-16 DIAGNOSIS — M4125 Other idiopathic scoliosis, thoracolumbar region: Secondary | ICD-10-CM
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
## 2022-04-17 NOTE — Patient Instructions (Signed)
Add awareness of scapular stabilizers prior to 6 directions of neck movements exercises

## 2022-04-22 ENCOUNTER — Other Ambulatory Visit: Payer: Self-pay | Admitting: Urology

## 2022-04-23 ENCOUNTER — Ambulatory Visit: Payer: Medicare Other | Admitting: Physical Therapy

## 2022-04-30 ENCOUNTER — Ambulatory Visit (INDEPENDENT_AMBULATORY_CARE_PROVIDER_SITE_OTHER): Payer: Medicare Other

## 2022-04-30 DIAGNOSIS — I495 Sick sinus syndrome: Secondary | ICD-10-CM | POA: Diagnosis not present

## 2022-04-30 LAB — CUP PACEART REMOTE DEVICE CHECK
Battery Remaining Longevity: 40 mo
Battery Voltage: 2.94 V
Brady Statistic AP VP Percent: 94.98 %
Brady Statistic AP VS Percent: 0.01 %
Brady Statistic AS VP Percent: 4.99 %
Brady Statistic AS VS Percent: 0.02 %
Brady Statistic RA Percent Paced: 94.89 %
Brady Statistic RV Percent Paced: 99.97 %
Date Time Interrogation Session: 20231226012119
Implantable Lead Connection Status: 753985
Implantable Lead Connection Status: 753985
Implantable Lead Implant Date: 20180214
Implantable Lead Implant Date: 20180214
Implantable Lead Location: 753859
Implantable Lead Location: 753860
Implantable Lead Model: 5076
Implantable Lead Model: 5076
Implantable Pulse Generator Implant Date: 20180214
Lead Channel Impedance Value: 304 Ohm
Lead Channel Impedance Value: 323 Ohm
Lead Channel Impedance Value: 380 Ohm
Lead Channel Impedance Value: 399 Ohm
Lead Channel Pacing Threshold Amplitude: 0.5 V
Lead Channel Pacing Threshold Amplitude: 0.875 V
Lead Channel Pacing Threshold Pulse Width: 0.4 ms
Lead Channel Pacing Threshold Pulse Width: 0.4 ms
Lead Channel Sensing Intrinsic Amplitude: 3 mV
Lead Channel Sensing Intrinsic Amplitude: 3 mV
Lead Channel Sensing Intrinsic Amplitude: 4.625 mV
Lead Channel Sensing Intrinsic Amplitude: 4.625 mV
Lead Channel Setting Pacing Amplitude: 2 V
Lead Channel Setting Pacing Amplitude: 2.5 V
Lead Channel Setting Pacing Pulse Width: 0.4 ms
Lead Channel Setting Sensing Sensitivity: 2 mV
Zone Setting Status: 755011
Zone Setting Status: 755011

## 2022-05-08 ENCOUNTER — Other Ambulatory Visit: Payer: Self-pay | Admitting: Family Medicine

## 2022-05-08 ENCOUNTER — Other Ambulatory Visit: Payer: Self-pay | Admitting: Urology

## 2022-05-08 DIAGNOSIS — N401 Enlarged prostate with lower urinary tract symptoms: Secondary | ICD-10-CM

## 2022-05-08 DIAGNOSIS — E291 Testicular hypofunction: Secondary | ICD-10-CM

## 2022-05-08 MED ORDER — TESTOSTERONE CYPIONATE 200 MG/ML IM SOLN: 200.0000 mg | INTRAMUSCULAR | 0 refills | Status: DC

## 2022-05-09 ENCOUNTER — Encounter: Payer: Medicare Other | Admitting: Physical Therapy

## 2022-05-10 ENCOUNTER — Ambulatory Visit: Payer: Medicare Other | Attending: Urology | Admitting: Physical Therapy

## 2022-05-10 DIAGNOSIS — M4125 Other idiopathic scoliosis, thoracolumbar region: Secondary | ICD-10-CM | POA: Diagnosis not present

## 2022-05-10 DIAGNOSIS — M533 Sacrococcygeal disorders, not elsewhere classified: Secondary | ICD-10-CM | POA: Diagnosis not present

## 2022-05-10 DIAGNOSIS — G8929 Other chronic pain: Secondary | ICD-10-CM | POA: Insufficient documentation

## 2022-05-10 DIAGNOSIS — R293 Abnormal posture: Secondary | ICD-10-CM | POA: Insufficient documentation

## 2022-05-10 DIAGNOSIS — R2689 Other abnormalities of gait and mobility: Secondary | ICD-10-CM | POA: Diagnosis not present

## 2022-05-10 DIAGNOSIS — M545 Low back pain, unspecified: Secondary | ICD-10-CM | POA: Diagnosis not present

## 2022-05-10 DIAGNOSIS — N39498 Other specified urinary incontinence: Secondary | ICD-10-CM | POA: Diagnosis present
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
## 2022-05-17 ENCOUNTER — Ambulatory Visit: Payer: Medicare Other | Admitting: Physical Therapy

## 2022-05-22 ENCOUNTER — Other Ambulatory Visit: Payer: Medicare Other

## 2022-05-23 ENCOUNTER — Other Ambulatory Visit: Payer: Medicare Other

## 2022-05-23 ENCOUNTER — Ambulatory Visit: Payer: Medicare Other | Admitting: Physical Therapy

## 2022-05-23 DIAGNOSIS — M4125 Other idiopathic scoliosis, thoracolumbar region: Secondary | ICD-10-CM

## 2022-05-23 DIAGNOSIS — G8929 Other chronic pain: Secondary | ICD-10-CM | POA: Diagnosis not present

## 2022-05-23 DIAGNOSIS — R2689 Other abnormalities of gait and mobility: Secondary | ICD-10-CM | POA: Diagnosis not present

## 2022-05-23 DIAGNOSIS — R293 Abnormal posture: Secondary | ICD-10-CM | POA: Diagnosis not present

## 2022-05-23 DIAGNOSIS — M545 Low back pain, unspecified: Secondary | ICD-10-CM

## 2022-05-23 DIAGNOSIS — R3914 Feeling of incomplete bladder emptying: Secondary | ICD-10-CM | POA: Diagnosis not present

## 2022-05-23 DIAGNOSIS — M533 Sacrococcygeal disorders, not elsewhere classified: Secondary | ICD-10-CM | POA: Diagnosis not present

## 2022-05-23 DIAGNOSIS — N401 Enlarged prostate with lower urinary tract symptoms: Secondary | ICD-10-CM

## 2022-05-23 DIAGNOSIS — N39498 Other specified urinary incontinence: Secondary | ICD-10-CM

## 2022-05-23 DIAGNOSIS — E291 Testicular hypofunction: Secondary | ICD-10-CM
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
## 2022-05-23 NOTE — Progress Notes (Signed)
Remote pacemaker transmission.   

## 2022-05-24 ENCOUNTER — Encounter: Payer: Self-pay | Admitting: Family Medicine

## 2022-05-24 LAB — TESTOSTERONE: Testosterone: 940 ng/dL — ABNORMAL HIGH (ref 264–916)

## 2022-05-24 LAB — HEMOGLOBIN: Hemoglobin: 16.4 g/dL (ref 13.0–17.7)

## 2022-05-24 LAB — HEMATOCRIT: Hematocrit: 50.4 % (ref 37.5–51.0)

## 2022-05-24 LAB — PSA: Prostate Specific Ag, Serum: 0.1 ng/mL (ref 0.0–4.0)

## 2022-05-28 ENCOUNTER — Ambulatory Visit: Payer: Medicare Other | Admitting: Physical Therapy

## 2022-05-28 DIAGNOSIS — R2689 Other abnormalities of gait and mobility: Secondary | ICD-10-CM | POA: Diagnosis not present

## 2022-05-28 DIAGNOSIS — G8929 Other chronic pain: Secondary | ICD-10-CM | POA: Diagnosis not present

## 2022-05-28 DIAGNOSIS — M4125 Other idiopathic scoliosis, thoracolumbar region: Secondary | ICD-10-CM | POA: Diagnosis not present

## 2022-05-28 DIAGNOSIS — M545 Low back pain, unspecified: Secondary | ICD-10-CM | POA: Diagnosis not present

## 2022-05-28 DIAGNOSIS — M533 Sacrococcygeal disorders, not elsewhere classified: Secondary | ICD-10-CM | POA: Diagnosis not present

## 2022-05-28 DIAGNOSIS — N39498 Other specified urinary incontinence: Secondary | ICD-10-CM

## 2022-05-28 DIAGNOSIS — R293 Abnormal posture: Secondary | ICD-10-CM
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
## 2022-06-06 ENCOUNTER — Ambulatory Visit: Payer: Medicare Other | Admitting: Physical Therapy

## 2022-06-20 ENCOUNTER — Ambulatory Visit: Payer: Medicare Other | Attending: Urology | Admitting: Physical Therapy

## 2022-06-20 DIAGNOSIS — N39498 Other specified urinary incontinence: Secondary | ICD-10-CM | POA: Insufficient documentation

## 2022-06-20 DIAGNOSIS — M4125 Other idiopathic scoliosis, thoracolumbar region: Secondary | ICD-10-CM | POA: Diagnosis not present

## 2022-06-20 DIAGNOSIS — R293 Abnormal posture: Secondary | ICD-10-CM | POA: Insufficient documentation

## 2022-06-20 DIAGNOSIS — G8929 Other chronic pain: Secondary | ICD-10-CM | POA: Diagnosis not present

## 2022-06-20 DIAGNOSIS — M545 Low back pain, unspecified: Secondary | ICD-10-CM | POA: Insufficient documentation

## 2022-06-20 DIAGNOSIS — R2689 Other abnormalities of gait and mobility: Secondary | ICD-10-CM | POA: Insufficient documentation

## 2022-06-20 DIAGNOSIS — M533 Sacrococcygeal disorders, not elsewhere classified: Secondary | ICD-10-CM | POA: Insufficient documentation
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
## 2022-06-27 DIAGNOSIS — I442 Atrioventricular block, complete: Secondary | ICD-10-CM | POA: Diagnosis not present

## 2022-06-27 DIAGNOSIS — I2510 Atherosclerotic heart disease of native coronary artery without angina pectoris: Secondary | ICD-10-CM | POA: Diagnosis not present

## 2022-06-27 DIAGNOSIS — Z8619 Personal history of other infectious and parasitic diseases: Secondary | ICD-10-CM | POA: Diagnosis not present

## 2022-06-27 DIAGNOSIS — S22001D Stable burst fracture of unspecified thoracic vertebra, subsequent encounter for fracture with routine healing: Secondary | ICD-10-CM | POA: Diagnosis not present

## 2022-06-27 DIAGNOSIS — I480 Paroxysmal atrial fibrillation: Secondary | ICD-10-CM | POA: Diagnosis not present

## 2022-06-27 DIAGNOSIS — R197 Diarrhea, unspecified: Secondary | ICD-10-CM | POA: Diagnosis not present

## 2022-06-27 DIAGNOSIS — R7303 Prediabetes: Secondary | ICD-10-CM | POA: Diagnosis not present

## 2022-06-27 DIAGNOSIS — I10 Essential (primary) hypertension: Secondary | ICD-10-CM | POA: Diagnosis not present

## 2022-06-27 DIAGNOSIS — E7800 Pure hypercholesterolemia, unspecified: Secondary | ICD-10-CM | POA: Diagnosis not present

## 2022-06-27 DIAGNOSIS — K5900 Constipation, unspecified: Secondary | ICD-10-CM | POA: Diagnosis not present

## 2022-06-27 DIAGNOSIS — R634 Abnormal weight loss: Secondary | ICD-10-CM | POA: Diagnosis not present

## 2022-07-04 ENCOUNTER — Ambulatory Visit: Payer: Medicare Other | Admitting: Physical Therapy

## 2022-07-04 DIAGNOSIS — M4125 Other idiopathic scoliosis, thoracolumbar region: Secondary | ICD-10-CM | POA: Diagnosis not present

## 2022-07-04 DIAGNOSIS — R293 Abnormal posture: Secondary | ICD-10-CM

## 2022-07-04 DIAGNOSIS — M545 Low back pain, unspecified: Secondary | ICD-10-CM

## 2022-07-04 DIAGNOSIS — M533 Sacrococcygeal disorders, not elsewhere classified: Secondary | ICD-10-CM | POA: Diagnosis not present

## 2022-07-04 DIAGNOSIS — R2689 Other abnormalities of gait and mobility: Secondary | ICD-10-CM

## 2022-07-04 DIAGNOSIS — G8929 Other chronic pain: Secondary | ICD-10-CM | POA: Diagnosis not present

## 2022-07-04 DIAGNOSIS — N39498 Other specified urinary incontinence: Secondary | ICD-10-CM

## 2022-07-04 NOTE — Patient Instructions (Addendum)
Combine:  Band behind shoulders  Side step -->  Mini squat ( look at the pond) --> pull band behind shoulders like "w" Along hallway Left direction, right direction  2 laps each direction   ___   Wall squat 20 reps  with elbows pressing against wall Then the neck direction with elbows   ___  Stepping back with heel up,  Opposite  arm is in half "V"  2 min   ___  Dolphin squats on wall   Fingers interlaced, elbows shoulder width apart, forearms in a triangle pressing against the wall Mini squat position   Inhale, exhale chin tucked, shoulders lengthen down from ears, as you rise up not locking knees, hairline brushes by thumbs ( "like dolphin snout / unicorn diving up our the water"  15 reps.  Make sure to not let the front ribs flare out, ribs over the pelvis aligned to not have a swayed back  Pressure through ballmounds to rise up, and dont lock the knees
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
## 2022-07-10 DIAGNOSIS — R634 Abnormal weight loss: Secondary | ICD-10-CM | POA: Diagnosis not present

## 2022-07-10 DIAGNOSIS — R197 Diarrhea, unspecified: Secondary | ICD-10-CM | POA: Diagnosis not present

## 2022-07-22 ENCOUNTER — Other Ambulatory Visit: Payer: Self-pay | Admitting: Internal Medicine

## 2022-07-23 NOTE — Telephone Encounter (Signed)
Please schedule 6 month F/U appointment with Dr. Klein. Thank you! 

## 2022-07-24 ENCOUNTER — Ambulatory Visit: Payer: Medicare Other | Attending: Urology | Admitting: Physical Therapy

## 2022-07-24 DIAGNOSIS — M545 Low back pain, unspecified: Secondary | ICD-10-CM | POA: Diagnosis not present

## 2022-07-24 DIAGNOSIS — R2689 Other abnormalities of gait and mobility: Secondary | ICD-10-CM | POA: Diagnosis not present

## 2022-07-24 DIAGNOSIS — M533 Sacrococcygeal disorders, not elsewhere classified: Secondary | ICD-10-CM | POA: Insufficient documentation

## 2022-07-24 DIAGNOSIS — N39498 Other specified urinary incontinence: Secondary | ICD-10-CM | POA: Insufficient documentation

## 2022-07-24 DIAGNOSIS — R293 Abnormal posture: Secondary | ICD-10-CM | POA: Insufficient documentation

## 2022-07-24 DIAGNOSIS — G8929 Other chronic pain: Secondary | ICD-10-CM | POA: Insufficient documentation

## 2022-07-24 DIAGNOSIS — M4125 Other idiopathic scoliosis, thoracolumbar region: Secondary | ICD-10-CM | POA: Insufficient documentation

## 2022-07-24 NOTE — Patient Instructions (Signed)
Progress with blue band with alternating hand back while stepping backward   2 min  __  3- foot tap  10 reps  Each side with green band at thigh , go slow anddfeet hip width apart   Hold onto wall   Slightly bend of standing knee, and keep hips above foot   [place] of opposite leg   taps to each direction and   back to spot under hips- notice equal pressure through both legs, and across [place] and heels    ___
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
## 2022-07-30 ENCOUNTER — Ambulatory Visit (INDEPENDENT_AMBULATORY_CARE_PROVIDER_SITE_OTHER): Payer: Medicare Other

## 2022-07-30 ENCOUNTER — Ambulatory Visit: Payer: Medicare Other | Admitting: Physical Therapy

## 2022-07-30 DIAGNOSIS — M533 Sacrococcygeal disorders, not elsewhere classified: Secondary | ICD-10-CM

## 2022-07-30 DIAGNOSIS — R293 Abnormal posture: Secondary | ICD-10-CM | POA: Diagnosis not present

## 2022-07-30 DIAGNOSIS — R2689 Other abnormalities of gait and mobility: Secondary | ICD-10-CM | POA: Diagnosis not present

## 2022-07-30 DIAGNOSIS — G8929 Other chronic pain: Secondary | ICD-10-CM

## 2022-07-30 DIAGNOSIS — M4125 Other idiopathic scoliosis, thoracolumbar region: Secondary | ICD-10-CM

## 2022-07-30 DIAGNOSIS — N39498 Other specified urinary incontinence: Secondary | ICD-10-CM

## 2022-07-30 DIAGNOSIS — I495 Sick sinus syndrome: Secondary | ICD-10-CM | POA: Diagnosis not present

## 2022-07-30 DIAGNOSIS — M545 Low back pain, unspecified: Secondary | ICD-10-CM | POA: Diagnosis not present

## 2022-07-30 LAB — CUP PACEART REMOTE DEVICE CHECK
Battery Remaining Longevity: 39 mo
Battery Voltage: 2.94 V
Brady Statistic AP VP Percent: 92.73 %
Brady Statistic AP VS Percent: 0 %
Brady Statistic AS VP Percent: 7.25 %
Brady Statistic AS VS Percent: 0.02 %
Brady Statistic RA Percent Paced: 92.46 %
Brady Statistic RV Percent Paced: 99.97 %
Date Time Interrogation Session: 20240326020537
Implantable Lead Connection Status: 753985
Implantable Lead Connection Status: 753985
Implantable Lead Implant Date: 20180214
Implantable Lead Implant Date: 20180214
Implantable Lead Location: 753859
Implantable Lead Location: 753860
Implantable Lead Model: 5076
Implantable Lead Model: 5076
Implantable Pulse Generator Implant Date: 20180214
Lead Channel Impedance Value: 323 Ohm
Lead Channel Impedance Value: 342 Ohm
Lead Channel Impedance Value: 399 Ohm
Lead Channel Impedance Value: 418 Ohm
Lead Channel Pacing Threshold Amplitude: 0.5 V
Lead Channel Pacing Threshold Amplitude: 0.875 V
Lead Channel Pacing Threshold Pulse Width: 0.4 ms
Lead Channel Pacing Threshold Pulse Width: 0.4 ms
Lead Channel Sensing Intrinsic Amplitude: 20.5 mV
Lead Channel Sensing Intrinsic Amplitude: 20.5 mV
Lead Channel Sensing Intrinsic Amplitude: 3.25 mV
Lead Channel Sensing Intrinsic Amplitude: 3.25 mV
Lead Channel Setting Pacing Amplitude: 2 V
Lead Channel Setting Pacing Amplitude: 2.5 V
Lead Channel Setting Pacing Pulse Width: 0.4 ms
Lead Channel Setting Sensing Sensitivity: 2 mV
Zone Setting Status: 755011
Zone Setting Status: 755011
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
## 2022-07-31 ENCOUNTER — Ambulatory Visit: Payer: Medicare Other | Admitting: Physical Therapy

## 2022-08-01 DIAGNOSIS — R634 Abnormal weight loss: Secondary | ICD-10-CM | POA: Diagnosis not present

## 2022-08-01 DIAGNOSIS — M546 Pain in thoracic spine: Secondary | ICD-10-CM | POA: Diagnosis not present

## 2022-08-01 DIAGNOSIS — Z85528 Personal history of other malignant neoplasm of kidney: Secondary | ICD-10-CM | POA: Diagnosis not present

## 2022-08-01 DIAGNOSIS — Z8619 Personal history of other infectious and parasitic diseases: Secondary | ICD-10-CM | POA: Diagnosis not present

## 2022-08-01 DIAGNOSIS — Z8673 Personal history of transient ischemic attack (TIA), and cerebral infarction without residual deficits: Secondary | ICD-10-CM | POA: Diagnosis not present

## 2022-08-01 DIAGNOSIS — G8929 Other chronic pain: Secondary | ICD-10-CM | POA: Diagnosis not present

## 2022-08-07 ENCOUNTER — Other Ambulatory Visit: Payer: Self-pay | Admitting: Family Medicine

## 2022-08-07 DIAGNOSIS — R634 Abnormal weight loss: Secondary | ICD-10-CM

## 2022-08-07 DIAGNOSIS — G8929 Other chronic pain: Secondary | ICD-10-CM

## 2022-08-08 ENCOUNTER — Encounter: Payer: Medicare Other | Admitting: Physical Therapy

## 2022-08-14 DIAGNOSIS — Z981 Arthrodesis status: Secondary | ICD-10-CM | POA: Diagnosis not present

## 2022-08-15 ENCOUNTER — Encounter: Payer: Medicare Other | Admitting: Physical Therapy

## 2022-08-21 ENCOUNTER — Ambulatory Visit: Payer: Medicare Other | Attending: Urology | Admitting: Physical Therapy

## 2022-08-21 DIAGNOSIS — M545 Low back pain, unspecified: Secondary | ICD-10-CM | POA: Insufficient documentation

## 2022-08-21 DIAGNOSIS — M533 Sacrococcygeal disorders, not elsewhere classified: Secondary | ICD-10-CM | POA: Insufficient documentation

## 2022-08-21 DIAGNOSIS — G8929 Other chronic pain: Secondary | ICD-10-CM | POA: Diagnosis not present

## 2022-08-21 DIAGNOSIS — R2689 Other abnormalities of gait and mobility: Secondary | ICD-10-CM | POA: Diagnosis not present

## 2022-08-21 DIAGNOSIS — M4125 Other idiopathic scoliosis, thoracolumbar region: Secondary | ICD-10-CM | POA: Insufficient documentation

## 2022-08-21 DIAGNOSIS — N39498 Other specified urinary incontinence: Secondary | ICD-10-CM | POA: Diagnosis present

## 2022-08-21 DIAGNOSIS — R293 Abnormal posture: Secondary | ICD-10-CM | POA: Insufficient documentation
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
## 2022-08-21 NOTE — Patient Instructions (Signed)
Pushing hands to seat,  One armed push up, shoulders down 10 reps each side  Pushing hands to seat,front ribs forward  10 reps

## 2022-08-22 ENCOUNTER — Ambulatory Visit
Admission: RE | Admit: 2022-08-22 | Discharge: 2022-08-22 | Disposition: A | Discharge status: Home / Self Care | Payer: Medicare Other | Source: Ambulatory Visit | Attending: Family Medicine | Admitting: Family Medicine

## 2022-08-22 DIAGNOSIS — M546 Pain in thoracic spine: Secondary | ICD-10-CM | POA: Diagnosis not present

## 2022-08-22 DIAGNOSIS — G8929 Other chronic pain: Secondary | ICD-10-CM

## 2022-08-22 DIAGNOSIS — R634 Abnormal weight loss: Secondary | ICD-10-CM

## 2022-08-28 NOTE — Progress Notes (Unsigned)
Cardiology Office Note Date:  08/29/2022  Patient ID:  [name], [name] February 16, 1933, MRN [id number] PCP:  Bosie Clos, MD  Cardiologist:  Lorine Bears, MD Electrophysiologist: Sherryl Manges, MD    Chief Complaint: 1 year device follow-up  History of Present Illness: Thomas [name] is a 89 y.o. male with PMH notable for CHB s/p PPM, parox Aflutter/afib, CAD, HTN; seen today for Sherryl Manges, MD for routine electrophysiology followup.   Today, he tells me that he overall feels very well. Remains active caring for garden. Retired Insurance underwriter.  Diligently takes tikosyn BID 9a and 9p, uses alarm as reminder.  No bleeding concerns He does have intermittent palpitations, some chest pressure. When he feels this he takes a propanolol. Does not take propanolol regularly. These episodes are brief, but happen a few times a week. BP readings at home are "all over the place" sometimes as high as 160, sometimes as low as 100s. When his BP is in 100s, he feels fatigue. He holds his amlodipine if BP has ben in 100-110s for many days in a row.    Device Information: MDT dual chamber PPM, imp 06/2016; dx CHB  AAD History: Tikosyn 125mg  BID  Past Medical History:  Diagnosis Date   Aortic valve disorders    Arthritis    Atrial flutter 06/18/2016   "AF or AFl; not sure which" (06/23/2016)   Basal cell carcinoma    "face, nose left shoulder, left arm" (06/19/2016)   Basal cell carcinoma 09/13/2020   right temple   BBB (bundle branch block)    hx right   Chronic back pain    "neck, thoracic, lower back" (06/19/2016)   Complete heart block 06/2016   Dyspnea    GERD (gastroesophageal reflux disease)    Gout    Heart block    "I've had type I, II Wenke before now" (06/19/2016)   History of gout    History of hiatal hernia    "self dx'd" (06/19/2016)   Hyperlipidemia    Hypertension    Lyme disease    "dx'd by me 2003; cx's showed dx 08/2015"   Migraine    "3-4/year" (06/19/2016)    Presence of permanent cardiac pacemaker 06/19/2016   PVC's (premature ventricular contractions)    Renal cancer, left 2006   S/P cryotherapy   Spinal stenosis    "cervical, 1 thoracic, lumbar" (06/19/2016)   Squamous carcinoma    "face, nose left shoulder, left arm" (06/19/2016)   Stroke    TIA (transient ischemic attack) 06/14/2016   "I'm not sure that's what it was" (06/25/2016)   Visit for monitoring Tikosyn therapy 09/09/2017    Past Surgical History:  Procedure Laterality Date   ANKLE FRACTURE SURGERY Right 1967   BACK SURGERY  05/07/2020   BASAL CELL CARCINOMA EXCISION     "face, nose left shoulder, left arm"   BIOPSY PROSTATE  2001 & 2003   CARDIAC CATHETERIZATION  1990's   CARDIOVERSION N/A 09/11/2017   Procedure: CARDIOVERSION;  Surgeon: Lewayne Bunting, MD;  Location: [facility];  Service: Cardiovascular;  Laterality: N/A;   FRACTURE SURGERY     HOLEP-LASER ENUCLEATION OF THE PROSTATE WITH MORCELLATION N/A 07/10/2020   Procedure: HOLEP-LASER ENUCLEATION OF THE PROSTATE WITH MORCELLATION;  Surgeon: Vanna Scotland, MD;  Location: [facility];  Service: Urology;  Laterality: N/A;   INGUINAL HERNIA REPAIR Left 2012   INSERT / REPLACE / REMOVE PACEMAKER  06/19/2016   LAPAROSCOPIC ABLATION RENAL MASS  LEFT HEART CATH AND CORONARY ANGIOGRAPHY Left 10/23/2017   Procedure: LEFT HEART CATH AND CORONARY ANGIOGRAPHY;  Surgeon: Iran Ouch, MD;  Location: [facility] INVASIVE CV LAB;  Service: Cardiovascular;  Laterality: Left;   LEFT HEART CATH AND CORONARY ANGIOGRAPHY Left 02/26/2021   Procedure: LEFT HEART CATH AND CORONARY ANGIOGRAPHY;  Surgeon: Iran Ouch, MD;  Location: [facility] INVASIVE CV LAB;  Service: Cardiovascular;  Laterality: Left;   PACEMAKER IMPLANT N/A 06/19/2016   Procedure: Pacemaker Implant;  Surgeon: Duke Salvia, MD;  Location: [facility]VASIVE CV LAB;  Service: Cardiovascular;  Laterality: N/A;   pacemasker     PROSTATE SURGERY     SQUAMOUS CELL CARCINOMA EXCISION      "face, nose left shoulder, left arm"   TONSILLECTOMY AND ADENOIDECTOMY      Current Outpatient Medications  Medication Instructions   2-3CC SYRINGE 1 mg, Does not apply, Every 14 days   amLODipine (NORVASC) 5 MG tablet TAKE 1 TABLET BY MOUTH DAILY   atorvastatin (LIPITOR) 40 mg, Oral, Daily   calcium carbonate (TUMS - DOSED IN MG ELEMENTAL CALCIUM) 500 MG chewable tablet 1-2 tablets, Oral, Daily PRN   CoQ10 100 mg, Oral, Daily   Cyanocobalamin (VITAMIN B-12 PO) 3,000 mcg, Oral, Daily with breakfast   dofetilide (TIKOSYN) 125 mcg, Oral, 2 times daily   ELIQUIS 5 MG TABS tablet TAKE 1 TABLET BY MOUTH TWICE A DAY   febuxostat (ULORIC) 40 mg, Oral, Daily   gabapentin (NEURONTIN) 300 mg, Oral, Daily at bedtime   Magnesium 500 mg, Oral, Daily at bedtime   methocarbamol (ROBAXIN) 500 mg, Oral, 3 times daily   metoprolol succinate (TOPROL-XL) 50 MG 24 hr tablet TAKE 1 TABLET BY MOUTH DAILY WITH OR IMMEDIATELY FOLLOWING A MEAL   metroNIDAZOLE (FLAGYL) 500 MG tablet TAKE 1 TABLET BY MOUTH TWICE DAILY ON SUNDAY AND MONDAY.   mirabegron ER (MYRBETRIQ) 25 mg, Oral, Daily   Multiple Vitamins-Minerals (PRESERVISION/LUTEIN PO) 1 capsule, Oral, Every morning   NEEDLE, DISP, 18 G (BD DISP NEEDLES) 18G X 1-1/2" MISC 1 mg, Does not apply, Every 14 days   NEEDLE, DISP, 21 G (BD DISP NEEDLES) 21G X 1-1/2" MISC 1 mg, Does not apply, Every 14 days   omeprazole (PRILOSEC) 20 MG capsule TAKE ONE CAPSULE BY MOUTH EVERY DAY FOR HEARTBURN TAKE 30 MINUTES BEFORE A MEAL   oxyCODONE (OXY IR/ROXICODONE) 5 mg, Oral, Every 6 hours PRN   oxyCODONE-acetaminophen (PERCOCET) 7.5-325 MG tablet 1-2 tablets, Oral, Every 4 hours PRN   polyethylene glycol (MIRALAX / GLYCOLAX) 17 g, Oral, Daily PRN   propranolol (INDERAL) 40 mg, Oral, 3 times daily   sildenafil (REVATIO) 20 MG tablet Take 3-5 tablets 1 hr prior   tamsulosin (FLOMAX) 0.4 mg, Oral, Daily at bedtime   terazosin (HYTRIN) 1 MG capsule TAKE ONE CAPSULE BY MOUTH AT  BEDTIME.   terbinafine (LAMISIL) 250 mg, Oral, Daily   Testosterone 1.62 % GEL APPLY 2 PUMPS DAILY   testosterone cypionate (DEPOTESTOSTERONE CYPIONATE) 200 mg, Intramuscular, Every 28 days   traMADol (ULTRAM) 50-100 mg, Oral, 4 times daily PRN   Turmeric 500 mg, Oral, Daily   Vitamin D3 5,000 Units, Oral, Daily    Social History:  The patient  reports that he has never smoked. He has never used smokeless tobacco. He reports current alcohol use of about 1.0 standard drink of alcohol per week. He reports that he does not use drugs.   Family History:  The patient's family history includes Aortic stenosis  in his mother; Arthritis in his father; Heart attack in his brother; Stroke in his brother.  ROS:  Please see the history of present illness. All other systems are reviewed and otherwise negative.   PHYSICAL EXAM:  VS:  BP 120/80 (BP Location: Left Arm, Patient Position: Sitting, Cuff Size: Normal)   Pulse 63   Ht 5\' 5"  (1.651 m)   Wt 158 lb (71.7 kg)   SpO2 94%   BMI 26.29 kg/m  BMI: Body mass index is 26.29 kg/m.  GEN- The patient is well appearing, alert and oriented x 3 today.   Lungs- Clear to ausculation bilaterally, normal work of breathing.  Heart- Regular rate and rhythm, no murmurs, rubs or gallops Extremities- No peripheral edema, warm, dry Skin-   device pocket well-healed   Device interrogation done today and reviewed by myself:  Battery good, 3 years Lead thresholds, impedence, sensing stable  Dependent  AF episodes present, all less than 10 minutes AF burden 0.2% burden, though seems to increase in spurts Blunted ventricular histograms Adjusted rate-response -- increased ADL rate 95 > 105 No further changes made today  EKG is ordered. Personal review of EKG from today shows:  AV paced, rate 63bpm QRS 182 QT 440, QTC 461 --   02/2022 EKG QRS 182 QT 478, QTC 501  Recent Labs: 01/09/2022: ALT 15; BUN 20; Creatinine, Ser 1.39; Platelets 176; Potassium 4.4;  Sodium 134; TSH 2.420 05/23/2022: Hemoglobin 16.4  01/09/2022: Chol/HDL Ratio 2.0; Cholesterol, Total 133; HDL 67; LDL Chol Calc (NIH) 56; Triglycerides 39   CrCl cannot be calculated (Patient's most recent lab result is older than the maximum 21 days allowed.).   Wt Readings from Last 3 Encounters:  08/29/22 158 lb (71.7 kg)  02/27/22 162 lb 12.8 oz (73.8 kg)  01/09/22 161 lb (73 kg)     Additional studies reviewed include: Previous EP, cardiology notes.   LHC, 02/26/2021    Dist RCA lesion is 40% stenosed with 40% stenosed side branch in RPAV.   Prox LAD lesion is 30% stenosed.   Mid LAD lesion is 60% stenosed.   Prox RCA lesion is 30% stenosed.   Mid RCA lesion is 70% stenosed.   Ost 2nd Mrg to 2nd Mrg lesion is 40% stenosed.   Prox Cx to Mid Cx lesion is 60% stenosed.   The left ventricular systolic function is normal.   LV end diastolic pressure is normal.   The left ventricular ejection fraction is 55-65% by visual estimate.  1.  Stable calcified moderate three-vessel coronary artery disease. 2.  Normal LV systolic function and normal left ventricular end-diastolic pressure.  TTE, 03/07/2020  1. Left ventricular ejection fraction, by estimation, is 45 to 50%. The left ventricle has mildly decreased function. The left ventricle demonstrates global hypokinesis. There is mild left ventricular hypertrophy. Left ventricular diastolic parameters are consistent with Grade I diastolic dysfunction (impaired relaxation). Elevated left atrial pressure. The average left ventricular global longitudinal strain is -9.9 %. The global longitudinal strain is abnormal.   2. Right ventricular systolic function is low normal. The right ventricular size is normal. There is normal pulmonary artery systolic pressure.   3. Right atrial size was mildly dilated.   4. The mitral valve is normal in structure. Mild to moderate mitral valve regurgitation. No evidence of mitral stenosis.   5. The aortic valve  is tricuspid. There is mild calcification of the aortic valve. There is mild thickening of the aortic valve. Aortic valve regurgitation is  mild. Mild to moderate aortic valve sclerosis/calcification is present, without any evidence  of aortic stenosis.   6. Pulmonic valve regurgitation not well assessed.   7. The inferior vena cava is normal in size with greater than 50% respiratory variability, suggesting right atrial pressure of 3 mmHg.    ASSESSMENT AND PLAN:  #) CHB s/p [employer] PPM Device functioning well Adjusted RR as above Patient walked around clinic and felt improved exercise capacity  #) parox Afib/ flutter Minimal burden by device Continue tikosyn BID - update labs at next appt CHA2DS2-VASc Score = 6 [CHF History: 0, HTN History: 1, Diabetes History: 0, Stroke History: 2, Vascular Disease History: 1, Age Score: 2, Gender Score: 0].  Therefore, the patient's annual risk of stroke is 9.7 % OAC -  eliquis BID, appropriately dosed .   #) HTN Well-controlled in office Patient has good home regimen and prefers no adjustments Cont amlodipine and PRN propanolol    Current medicines are reviewed at length with the patient today.   The patient does not have concerns regarding his medicines.  The following changes were made today:  none  Labs/ tests ordered today include:  Orders Placed This Encounter  Procedures   EKG 12-Lead     Disposition: Follow up with Dr. Graciela Husbands or EP APP in in 6 months   Signed, Sherie Don, NP  08/29/22  2:47 PM  Electrophysiology [facility]

## 2022-08-29 ENCOUNTER — Encounter: Payer: Medicare Other | Admitting: Physical Therapy

## 2022-08-29 ENCOUNTER — Encounter: Payer: Self-pay | Admitting: Cardiology

## 2022-08-29 ENCOUNTER — Ambulatory Visit: Payer: Medicare Other | Attending: Cardiology | Admitting: Cardiology

## 2022-08-29 VITALS — BP 120/80 | HR 63 | Ht 65.0 in | Wt 158.0 lb

## 2022-08-29 DIAGNOSIS — Z79899 Other long term (current) drug therapy: Secondary | ICD-10-CM

## 2022-08-29 DIAGNOSIS — Z950 Presence of cardiac pacemaker: Secondary | ICD-10-CM | POA: Diagnosis not present

## 2022-08-29 DIAGNOSIS — I10 Essential (primary) hypertension: Secondary | ICD-10-CM

## 2022-08-29 DIAGNOSIS — Z5181 Encounter for therapeutic drug level monitoring: Secondary | ICD-10-CM | POA: Diagnosis not present

## 2022-08-29 DIAGNOSIS — I442 Atrioventricular block, complete: Secondary | ICD-10-CM

## 2022-08-29 DIAGNOSIS — I480 Paroxysmal atrial fibrillation: Secondary | ICD-10-CM

## 2022-08-29 LAB — CUP PACEART INCLINIC DEVICE CHECK
Date Time Interrogation Session: 20240425150719
Implantable Lead Connection Status: 753985
Implantable Lead Connection Status: 753985
Implantable Lead Implant Date: 20180214
Implantable Lead Implant Date: 20180214
Implantable Lead Location: 753859
Implantable Lead Location: 753860
Implantable Lead Model: 5076
Implantable Lead Model: 5076
Implantable Pulse Generator Implant Date: 20180214

## 2022-08-29 MED ORDER — DOFETILIDE 125 MCG PO CAPS: 125.0000 ug | ORAL_CAPSULE | Freq: Two times a day (BID) | ORAL | 2 refills | Status: DC

## 2022-08-29 NOTE — Patient Instructions (Signed)
Medication Instructions:  Your physician recommends that you continue on your current medications as directed. Please refer to the Current Medication list given to you today.  *If you need a refill on your cardiac medications before your next appointment, please call your pharmacy*   Lab Work:  No labs ordered today.  If you have labs (blood work) drawn today and your tests are completely normal, you will receive your results only by: MyChart Message (if you have MyChart) OR A paper copy in the mail If you have any lab test that is abnormal or we need to change your treatment, we will call you to review the results.   Testing/Procedures:  No testing ordered today.   Follow-Up: At [facility], you and your health needs are our priority.  As part of our continuing mission to provide you with exceptional heart care, we have created designated Provider Care Teams.  These Care Teams include your primary Cardiologist (physician) and Advanced Practice Providers (APPs -  Physician Assistants and Nurse Practitioners) who all work together to provide you with the care you need, when you need it.  We recommend signing up for the patient portal called "MyChart".  Sign up information is provided on this After Visit Summary.  MyChart is used to connect with patients for Virtual Visits (Telemedicine).  Patients are able to view lab/test results, encounter notes, upcoming appointments, etc.  Non-urgent messages can be sent to your provider as well.   To learn more about what you can do with MyChart, go to [email or web address].    Your next appointment:    F/u 3 month(s) w/ Lorine Bears, MD/APP  F/u 6 months with Dr. Albertine Patricia, NP

## 2022-09-05 ENCOUNTER — Encounter: Payer: Medicare Other | Admitting: Physical Therapy

## 2022-09-10 NOTE — Progress Notes (Signed)
Remote pacemaker transmission.   

## 2022-09-12 ENCOUNTER — Ambulatory Visit: Payer: Medicare Other | Attending: Urology | Admitting: Physical Therapy

## 2022-09-12 DIAGNOSIS — M545 Low back pain, unspecified: Secondary | ICD-10-CM | POA: Insufficient documentation

## 2022-09-12 DIAGNOSIS — M4125 Other idiopathic scoliosis, thoracolumbar region: Secondary | ICD-10-CM | POA: Insufficient documentation

## 2022-09-12 DIAGNOSIS — R293 Abnormal posture: Secondary | ICD-10-CM | POA: Diagnosis not present

## 2022-09-12 DIAGNOSIS — G8929 Other chronic pain: Secondary | ICD-10-CM | POA: Diagnosis not present

## 2022-09-12 DIAGNOSIS — R2689 Other abnormalities of gait and mobility: Secondary | ICD-10-CM | POA: Diagnosis not present

## 2022-09-12 DIAGNOSIS — M533 Sacrococcygeal disorders, not elsewhere classified: Secondary | ICD-10-CM | POA: Diagnosis not present
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
## 2022-09-24 ENCOUNTER — Encounter: Payer: Medicare Other | Admitting: Physical Therapy

## 2022-10-07 ENCOUNTER — Ambulatory Visit: Payer: Medicare Other | Attending: Urology | Admitting: Physical Therapy

## 2022-10-07 DIAGNOSIS — R2689 Other abnormalities of gait and mobility: Secondary | ICD-10-CM | POA: Insufficient documentation

## 2022-10-07 DIAGNOSIS — N39498 Other specified urinary incontinence: Secondary | ICD-10-CM | POA: Insufficient documentation

## 2022-10-07 DIAGNOSIS — G8929 Other chronic pain: Secondary | ICD-10-CM | POA: Insufficient documentation

## 2022-10-07 DIAGNOSIS — M4125 Other idiopathic scoliosis, thoracolumbar region: Secondary | ICD-10-CM | POA: Diagnosis not present

## 2022-10-07 DIAGNOSIS — M533 Sacrococcygeal disorders, not elsewhere classified: Secondary | ICD-10-CM | POA: Diagnosis not present

## 2022-10-07 DIAGNOSIS — M545 Low back pain, unspecified: Secondary | ICD-10-CM | POA: Insufficient documentation

## 2022-10-07 DIAGNOSIS — R293 Abnormal posture: Secondary | ICD-10-CM | POA: Diagnosis not present

## 2022-10-07 NOTE — Patient Instructions (Addendum)
Pool activity:  Back stroke 2 laps  Reach longer   Side step and shoulder abduction 30 deg Left and Right laps    Relaxation on noodles under head, armpits, midback, posterior thighs "Shoulder abduction and hip abduction ( like jumping jack m ovement) but you are floating on surface   __   avoid breast stroke and freestyle
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
## 2022-10-10 DIAGNOSIS — Q103 Other congenital malformations of eyelid: Secondary | ICD-10-CM | POA: Diagnosis not present

## 2022-10-14 ENCOUNTER — Encounter: Payer: Medicare Other | Admitting: Physical Therapy

## 2022-10-14 ENCOUNTER — Telehealth: Payer: Self-pay | Admitting: Cardiovascular Disease

## 2022-10-14 ENCOUNTER — Encounter: Payer: Self-pay | Admitting: Medical

## 2022-10-14 ENCOUNTER — Ambulatory Visit: Payer: Medicare Other | Attending: Medical | Admitting: Medical

## 2022-10-14 ENCOUNTER — Other Ambulatory Visit
Admission: RE | Admit: 2022-10-14 | Discharge: 2022-10-14 | Disposition: A | Discharge status: Home / Self Care | Payer: Medicare Other | Source: Ambulatory Visit | Attending: Medical | Admitting: Medical

## 2022-10-14 VITALS — BP 118/76 | HR 68 | Ht 65.0 in | Wt 155.6 lb

## 2022-10-14 DIAGNOSIS — E785 Hyperlipidemia, unspecified: Secondary | ICD-10-CM | POA: Diagnosis not present

## 2022-10-14 DIAGNOSIS — I480 Paroxysmal atrial fibrillation: Secondary | ICD-10-CM

## 2022-10-14 DIAGNOSIS — I10 Essential (primary) hypertension: Secondary | ICD-10-CM

## 2022-10-14 DIAGNOSIS — R0602 Shortness of breath: Secondary | ICD-10-CM

## 2022-10-14 DIAGNOSIS — N1831 Chronic kidney disease, stage 3a: Secondary | ICD-10-CM | POA: Diagnosis not present

## 2022-10-14 DIAGNOSIS — I25118 Atherosclerotic heart disease of native coronary artery with other forms of angina pectoris: Secondary | ICD-10-CM | POA: Diagnosis not present

## 2022-10-14 DIAGNOSIS — I429 Cardiomyopathy, unspecified: Secondary | ICD-10-CM

## 2022-10-14 DIAGNOSIS — I442 Atrioventricular block, complete: Secondary | ICD-10-CM

## 2022-10-14 DIAGNOSIS — N189 Chronic kidney disease, unspecified: Secondary | ICD-10-CM | POA: Diagnosis not present

## 2022-10-14 DIAGNOSIS — G629 Polyneuropathy, unspecified: Secondary | ICD-10-CM | POA: Diagnosis not present

## 2022-10-14 LAB — BASIC METABOLIC PANEL
Anion gap: 9 (ref 5–15)
BUN: 29 mg/dL — ABNORMAL HIGH (ref 8–23)
CO2: 28 mmol/L (ref 22–32)
Calcium: 9.2 mg/dL (ref 8.9–10.3)
Chloride: 101 mmol/L (ref 98–111)
Creatinine, Ser: 1.53 mg/dL — ABNORMAL HIGH (ref 0.61–1.24)
GFR, Estimated: 43 mL/min — ABNORMAL LOW (ref >60)
Glucose, Bld: 92 mg/dL (ref 70–99)
Potassium: 5.2 mmol/L — ABNORMAL HIGH (ref 3.5–5.1)
Sodium: 138 mmol/L (ref 135–145)

## 2022-10-14 LAB — MAGNESIUM: Magnesium: 2.3 mg/dL (ref 1.7–2.4)

## 2022-10-14 LAB — CBC
HCT: 49.2 % (ref 39.0–52.0)
Hemoglobin: 16.2 g/dL (ref 13.0–17.0)
MCH: 29.4 pg (ref 26.0–34.0)
MCHC: 32.9 g/dL (ref 30.0–36.0)
MCV: 89.3 fL (ref 80.0–100.0)
Platelets: 183 10*3/uL (ref 150–400)
RBC: 5.51 MIL/uL (ref 4.22–5.81)
RDW: 14.8 % (ref 11.5–15.5)
WBC: 7.8 10*3/uL (ref 4.0–10.5)
nRBC: 0 % (ref 0.0–0.2)

## 2022-10-14 NOTE — Telephone Encounter (Signed)
Spoke with patient's spouse and she stated that the patient's generalized weakness has increased and gotten worse over the last month. She stated its worse during exercise but that he has more overall. Patient denies chest pain, dizziness, nausea, vomiting, syncope, fever, chills, and uncontrolled pain. Patient was able to be scheduled for today 10/14/22 at 2:20 PM

## 2022-10-14 NOTE — Telephone Encounter (Signed)
Pt c/o Shortness Of Breath: STAT if SOB developed within the last 24 hours or pt is noticeably SOB on the phone  1. Are you currently SOB (can you hear that pt is SOB on the phone)? SOB with exercise   2. How long have you been experiencing SOB? Wife states for about a month.  He told her he feels like he is out of shape.    3. Are you SOB when sitting or when up moving around? When he is exercising.   4. Are you currently experiencing any other symptoms? Generalized fatigued.  Wife states he has an appt on July 25th, she would like for him to be seen sooner.

## 2022-10-14 NOTE — Patient Instructions (Addendum)
Medication Instructions:  Continue taking medications as prescribed.  *If you need a refill on your cardiac medications before your next appointment, please call your pharmacy*   Lab Work: Your provider would like for you to have following labs drawn: BMP, CBC, MG   Please go to the [facility] entrance and check in at the front desk.  You do not need an appointment.  They are open from 7am-6 pm.   If you have labs (blood work) drawn today and your tests are completely normal, you will receive your results only by: MyChart Message (if you have MyChart) OR A paper copy in the mail If you have any lab test that is abnormal or we need to change your treatment, we will call you to review the results.   Testing/Procedures: Your physician has requested that you have an echocardiogram. Echocardiography is a painless test that uses sound waves to create images of your heart. It provides your doctor with information about the size and shape of your heart and how well your heart's chambers and valves are working.   You may receive an ultrasound enhancing agent through an IV if needed to better visualize your heart during the echo. This procedure takes approximately one hour.  There are no restrictions for this procedure.  This will take place at [address] [address], [place] [address]    Follow-Up: At [facility], you and your health needs are our priority.  As part of our continuing mission to provide you with exceptional heart care, we have created designated Provider Care Teams.  These Care Teams include your primary Cardiologist (physician) and Advanced Practice Providers (APPs -  Physician Assistants and Nurse Practitioners) who all work together to provide you with the care you need, when you need it.  We recommend signing up for the patient portal called "MyChart".  Sign up information is provided on this After Visit Summary.  MyChart is used to  connect with patients for Virtual Visits (Telemedicine).  Patients are able to view lab/test results, encounter notes, upcoming appointments, etc.  Non-urgent messages can be sent to your provider as well.   To learn more about what you can do with MyChart, go to [email or web address].    Your next appointment:   July 25th, 2024 at 4:20 PM  Provider:   You may see Lorine Bears, MD or one of the following Advanced Practice Providers on your designated Care Team:   Nicolasa Ducking, NP Eula Listen, PA-C Cadence Fransico Michael, PA-C Charlsie Quest, NP

## 2022-10-14 NOTE — Progress Notes (Signed)
Cardiology Office Note:    Date:  10/14/2022   ID:  Rica Koyanagi, DOB Sep 07, 1932, MRN [id number]  PCP:  Bosie Clos, MD  [facility] Cardiologist:  Lorine Bears, MD  [facility] Electrophysiologist:  Sherryl Manges, MD   Referring MD: Bosie Clos, MD   Chief Complaint: weakness/SOB  History of Present Illness:    Thomas [name] is a 89 y.o. male with a hx of CHB s/p PPM 06/2016, pAfib/flutter, CAD, cardiomyopathy, TIA, CKD stage III with partial nephrectomy due to cancer, hyperlipidemia, GERD, HTN who is being seen for weakness, SOB.   Echo in 2019 showed low normal LVSF with an EF of 50 to 55% with anterior septal wall motion consistent with paced rhythm.  Coronary CTA in 09/2017 showed a calcium score 1366 with possible significant stenosis in the mid LAD and a 50% stenosis in the mid left circumflex.  The RCA cannot be visualized due to pacemaker artifact.  Left heart cath in 10/2017 showed borderline three-vessel CAD with 60% mid LAD stenosis, 50% proximal left circumflex stenosis, and 70% mid RCA stenosis.  Coronary arteries were noted to be moderately calcified overall.  LVEDP was 18 mmHg.  No revascularization was performed.  Echo in October 2020 showed a slight drop in his LV function, 45 to 50% with global hypokinesis.  Echo in November 2021 showed EF of 40 to 45%, grade 1 diastolic dysfunction.  Patient underwent heart cath in 2022 for exertional dyspnea and chest tightness.  This showed stable calcified moderate three-vessel CAD, normal LVSF and normal LVEDP.  Disease did not require revascularization.  Since that time, patient has mainly been following with EP.  Today, the patient reports last week he missed a dose of Tikosyn, he took it at 6 instead of 9 (3 hours early). He started feeling weak, tired and started feeling symptoms of Afib and PVCs. Said symptoms continued for a couple days. He didn't feel normal until this morning.   Past Medical History:   Diagnosis Date   Aortic valve disorders    Arthritis    Atrial flutter (HCC) 06/18/2016   "AF or AFl; not sure which" (06/23/2016)   Basal cell carcinoma    "face, nose left shoulder, left arm" (06/19/2016)   Basal cell carcinoma 09/13/2020   right temple   BBB (bundle branch block)    hx right   Chronic back pain    "neck, thoracic, lower back" (06/19/2016)   Complete heart block (HCC) 06/2016   Dyspnea    GERD (gastroesophageal reflux disease)    Gout    Heart block    "I've had type I, II Wenke before now" (06/19/2016)   History of gout    History of hiatal hernia    "self dx'd" (06/19/2016)   Hyperlipidemia    Hypertension    Lyme disease    "dx'd by me 2003; cx's showed dx 08/2015"   Migraine    "3-4/year" (06/19/2016)   Presence of permanent cardiac pacemaker 06/19/2016   PVC's (premature ventricular contractions)    Renal cancer, left (HCC) 2006   S/P cryotherapy   Spinal stenosis    "cervical, 1 thoracic, lumbar" (06/19/2016)   Squamous carcinoma    "face, nose left shoulder, left arm" (06/19/2016)   Stroke (HCC)    TIA (transient ischemic attack) 06/14/2016   "I'm not sure that's what it was" (06/25/2016)   Visit for monitoring Tikosyn therapy 09/09/2017    Past Surgical History:  Procedure Laterality Date  ANKLE FRACTURE SURGERY Right 1967   BACK SURGERY  05/07/2020   BASAL CELL CARCINOMA EXCISION     "face, nose left shoulder, left arm"   BIOPSY PROSTATE  2001 & 2003   CARDIAC CATHETERIZATION  1990's   CARDIOVERSION N/A 09/11/2017   Procedure: CARDIOVERSION;  Surgeon: Lewayne Bunting, MD;  Location: [facility] ENDOSCOPY;  Service: Cardiovascular;  Laterality: N/A;   FRACTURE SURGERY     HOLEP-LASER ENUCLEATION OF THE PROSTATE WITH MORCELLATION N/A 07/10/2020   Procedure: HOLEP-LASER ENUCLEATION OF THE PROSTATE WITH MORCELLATION;  Surgeon: Vanna Scotland, MD;  Location: [facility] ORS;  Service: Urology;  Laterality: N/A;   INGUINAL HERNIA REPAIR Left 2012   INSERT /  REPLACE / REMOVE PACEMAKER  06/19/2016   LAPAROSCOPIC ABLATION RENAL MASS     LEFT HEART CATH AND CORONARY ANGIOGRAPHY Left 10/23/2017   Procedure: LEFT HEART CATH AND CORONARY ANGIOGRAPHY;  Surgeon: Iran Ouch, MD;  Location: [facility] INVASIVE CV LAB;  Service: Cardiovascular;  Laterality: Left;   LEFT HEART CATH AND CORONARY ANGIOGRAPHY Left 02/26/2021   Procedure: LEFT HEART CATH AND CORONARY ANGIOGRAPHY;  Surgeon: Iran Ouch, MD;  Location: [facility] INVASIVE CV LAB;  Service: Cardiovascular;  Laterality: Left;   PACEMAKER IMPLANT N/A 06/19/2016   Procedure: Pacemaker Implant;  Surgeon: Duke Salvia, MD;  Location: [facility] INVASIVE CV LAB;  Service: Cardiovascular;  Laterality: N/A;   pacemasker     PROSTATE SURGERY     SQUAMOUS CELL CARCINOMA EXCISION     "face, nose left shoulder, left arm"   TONSILLECTOMY AND ADENOIDECTOMY      Current Medications: Current Meds  Medication Sig   amLODipine (NORVASC) 5 MG tablet TAKE 1 TABLET BY MOUTH DAILY   atorvastatin (LIPITOR) 40 MG tablet Take 1 tablet (40 mg total) by mouth daily.   calcium carbonate (TUMS - DOSED IN MG ELEMENTAL CALCIUM) 500 MG chewable tablet Chew 1-2 tablets by mouth daily as needed for indigestion.   Cholecalciferol (VITAMIN D3) 5000 units CAPS Take 5,000 Units by mouth daily.   Coenzyme Q10 (COQ10) 100 MG CAPS Take 100 mg by mouth daily.   Cyanocobalamin (VITAMIN B-12 PO) Take 3,000 mcg by mouth daily with breakfast.   dofetilide (TIKOSYN) 125 MCG capsule Take 1 capsule (125 mcg total) by mouth 2 (two) times daily.   ELIQUIS 5 MG TABS tablet TAKE 1 TABLET BY MOUTH TWICE A DAY   febuxostat (ULORIC) 40 MG tablet Take 1 tablet (40 mg total) by mouth daily.   gabapentin (NEURONTIN) 300 MG capsule Take 300 mg by mouth at bedtime.   Magnesium 500 MG TABS Take 500 mg by mouth at bedtime.   methocarbamol (ROBAXIN) 500 MG tablet Take 500 mg by mouth 3 (three) times daily.   metoprolol succinate (TOPROL-XL) 50 MG 24 hr tablet  TAKE 1 TABLET BY MOUTH DAILY WITH OR IMMEDIATELY FOLLOWING A MEAL   metroNIDAZOLE (FLAGYL) 500 MG tablet TAKE 1 TABLET BY MOUTH TWICE DAILY ON SUNDAY AND MONDAY.   mirabegron ER (MYRBETRIQ) 25 MG TB24 tablet Take 1 tablet (25 mg total) by mouth daily.   Multiple Vitamins-Minerals (PRESERVISION/LUTEIN PO) Take 1 capsule by mouth in the morning.   NEEDLE, DISP, 18 G (BD DISP NEEDLES) 18G X 1-1/2" MISC 1 mg by Does not apply route every 14 (fourteen) days.   NEEDLE, DISP, 21 G (BD DISP NEEDLES) 21G X 1-1/2" MISC 1 mg by Does not apply route every 14 (fourteen) days.   omeprazole (PRILOSEC) 20 MG capsule TAKE ONE  CAPSULE BY MOUTH EVERY DAY FOR HEARTBURN TAKE 30 MINUTES BEFORE A MEAL   polyethylene glycol (MIRALAX / GLYCOLAX) 17 g packet Take 17 g by mouth daily as needed for moderate constipation.   propranolol (INDERAL) 40 MG tablet Take 1 tablet (40 mg total) by mouth 3 (three) times daily.   sildenafil (REVATIO) 20 MG tablet Take 3-5 tablets 1 hr prior   Syringe, Disposable, (2-3CC SYRINGE) 3 ML MISC 1 mg by Does not apply route every 14 (fourteen) days.   tamsulosin (FLOMAX) 0.4 MG CAPS capsule Take 0.4 mg by mouth at bedtime.   terazosin (HYTRIN) 1 MG capsule TAKE ONE CAPSULE BY MOUTH AT BEDTIME.   terbinafine (LAMISIL) 250 MG tablet Take 1 tablet (250 mg total) by mouth daily.   Testosterone 1.62 % GEL APPLY 2 PUMPS DAILY   testosterone cypionate (DEPOTESTOSTERONE CYPIONATE) 200 MG/ML injection Inject 1 mL (200 mg total) into the muscle every 28 (twenty-eight) days.   traMADol (ULTRAM) 50 MG tablet Take 50-100 mg by mouth 4 (four) times daily as needed for severe pain.   Turmeric 500 MG CAPS Take 500 mg by mouth daily.     Allergies:   Other, Iodine, and Penicillins   Social History   Socioeconomic History   Marital status: Married    Spouse name: Not on file   Number of children: 3   Years of education: MD    Highest education level: Master's degree (e.g., MA, MS, MEng, MEd, MSW, MBA)   Occupational History   Occupation: Retired   Tobacco Use   Smoking status: Never   Smokeless tobacco: Never  Vaping Use   Vaping Use: Never used  Substance and Sexual Activity   Alcohol use: Yes    Alcohol/week: 1.0 standard drink of alcohol    Types: 1 Glasses of wine per week   Drug use: No   Sexual activity: Yes  Other Topics Concern   Not on file  Social History Narrative   Independent at baseline. Lives with family   Drinks tea in the morning    Social Determinants of Health   Financial Resource Strain: Low Risk  (09/26/2021)   Overall Financial Resource Strain (CARDIA)    Difficulty of Paying Living Expenses: Not hard at all  Food Insecurity: No Food Insecurity (09/26/2021)   Hunger Vital Sign    Worried About Running Out of Food in the Last Year: Never true    Ran Out of Food in the Last Year: Never true  Transportation Needs: No Transportation Needs (09/26/2021)   PRAPARE - Administrator, Civil Service (Medical): No    Lack of Transportation (Non-Medical): No  Physical Activity: Sufficiently Active (09/26/2021)   Exercise Vital Sign    Days of Exercise per Week: 5 days    Minutes of Exercise per Session: 50 min  Stress: No Stress Concern Present (09/26/2021)   [employer] of Occupational Health - Occupational Stress Questionnaire    Feeling of Stress : Not at all  Social Connections: Socially Integrated (09/26/2021)   Social Connection and Isolation Panel [NHANES]    Frequency of Communication with Friends and Family: Twice a week    Frequency of Social Gatherings with Friends and Family: Twice a week    Attends Religious Services: 1 to 4 times per year    Active Member of [employer] or Organizations: Yes    Attends Engineer, structural: More than 4 times per year    Marital Status: Married  Family History: The patient's family history includes Aortic stenosis in his mother; Arthritis in his father; Heart attack in his brother; Stroke in  his brother.  ROS:   Please see the history of present illness.     All other systems reviewed and are negative.  EKGs/Labs/Other Studies Reviewed:    The following studies were reviewed today:  LHC 2022   Dist RCA lesion is 40% stenosed with 40% stenosed side branch in RPAV.   Prox LAD lesion is 30% stenosed.   Mid LAD lesion is 60% stenosed.   Prox RCA lesion is 30% stenosed.   Mid RCA lesion is 70% stenosed.   Ost 2nd Mrg to 2nd Mrg lesion is 40% stenosed.   Prox Cx to Mid Cx lesion is 60% stenosed.   The left ventricular systolic function is normal.   LV end diastolic pressure is normal.   The left ventricular ejection fraction is 55-65% by visual estimate.   1.  Stable calcified moderate three-vessel coronary artery disease. 2.  Normal LV systolic function and normal left ventricular end-diastolic pressure.   Recommendations: No critical disease to require revascularization. Recommend continued aggressive medical therapy.  EKG:  EKG is ordered today.  The ekg ordered today demonstrates AV paced rhythm, 68bpm, LBBB  Recent Labs: 01/09/2022: ALT 15; BUN 20; Creatinine, Ser 1.39; Potassium 4.4; Sodium 134; TSH 2.420 10/14/2022: Hemoglobin 16.2; Platelets 183  Recent Lipid Panel    Component Value Date/Time   CHOL 133 01/09/2022 1425   CHOL 144 06/26/2012 1106   TRIG 39 01/09/2022 1425   TRIG 46 06/26/2012 1106   HDL 67 01/09/2022 1425   HDL 61 (H) 06/26/2012 1106   CHOLHDL 2.0 01/09/2022 1425   CHOLHDL 2.4 06/14/2016 1346   VLDL 8 06/14/2016 1346   VLDL 9 06/26/2012 1106   LDLCALC 56 01/09/2022 1425   LDLCALC 74 06/26/2012 1106     Physical Exam:    VS:  BP 118/76 (BP Location: Left Arm, Patient Position: Sitting, Cuff Size: Normal)   Pulse 68   Ht 5\' 5"  (1.651 m)   Wt 155 lb 9.6 oz (70.6 kg)   SpO2 97%   BMI 25.89 kg/m     Wt Readings from Last 3 Encounters:  10/14/22 155 lb 9.6 oz (70.6 kg)  08/29/22 158 lb (71.7 kg)  02/27/22 162 lb 12.8 oz (73.8 kg)      GEN:  Well nourished, well developed in no acute distress HEENT: Normal NECK: No JVD; No carotid bruits LYMPHATICS: No lymphadenopathy CARDIAC: RRR, no murmurs, rubs, gallops RESPIRATORY:  Clear to auscultation without rales, wheezing or rhonchi  ABDOMEN: Soft, non-tender, non-distended MUSCULOSKELETAL:  No edema; No deformity  SKIN: Warm and dry NEUROLOGIC:  Alert and oriented x 3 PSYCHIATRIC:  Normal affect   ASSESSMENT:    1. Paroxysmal atrial fibrillation (HCC)   2. Complete heart block (HCC)   3. Coronary artery disease involving native coronary artery of native heart with other form of angina pectoris (HCC)   4. Cardiomyopathy, unspecified type (HCC)   5. SOB (shortness of breath)   6. Essential hypertension    PLAN:    In order of problems listed above:  Paroxysmal Afib/flutter Patient reports possible Afib/PVC episode last week for a couple days after taking Tikosyn 3 hours early. He was feeling very tired, weak and had SOB. Today, he is feeling back to normal. He is in NSR on exam. EKG shows AV paced rhythm on Toprol. He takes Tikosyn BID.  Prior device checks show known pAfib. CHADSVASC at least 6. He takes Eliquis for stroke ppx and denies missing doses. I spoke with EP APP and  recommended labs today ( BMET, CBC, Mag ). Also, patient can send in a device transmission if this reoccurs again. Otherwise, continue Tikosyn BID.   CHB s/p Medtronic PPM Device functioning well on last interrogation. He follows with EP  CAD LHC in 2022 showed stable calcified moderate 3V CAD, normal LVSF and normal LVEDP. No chest pain reported. Continue Lipitor. No ASA with Eliquis.   CM H/o mildly reduced pump function 45-50%. He reports minimal SOB. No lower leg edema. I will update an echo.   HTN BP good today, continue Amlodipine and Toprol.   Disposition: Follow up in 2 month(s) with MD/APP     Signed, Marshell Rieger David Stall, PA-C  10/14/2022 4:05 PM    [facility]

## 2022-10-15 ENCOUNTER — Other Ambulatory Visit: Payer: Self-pay

## 2022-10-15 DIAGNOSIS — Z79899 Other long term (current) drug therapy: Secondary | ICD-10-CM

## 2022-10-16 ENCOUNTER — Encounter: Payer: Medicare Other | Admitting: Physical Therapy

## 2022-10-21 ENCOUNTER — Ambulatory Visit: Payer: Medicare Other | Attending: Medical

## 2022-10-21 DIAGNOSIS — R0602 Shortness of breath: Secondary | ICD-10-CM | POA: Diagnosis not present

## 2022-10-21 LAB — ECHOCARDIOGRAM COMPLETE
P 1/2 time: 621 msec
S' Lateral: 3.2 cm

## 2022-10-23 NOTE — Telephone Encounter (Signed)
Pt wife made aware of ECHO results along with Cadence's recommendations. Wife verbalized understanding Appointment moved up to 11/14/22 for further evaluations.   Furth, Cadence H, PA-C  Parke Poisson, RN Echo showed LVEF 30-35%, mild MR, mild to mod AI. Pump function is reduced from prior LHC where it was 50-60%. May need to discuss heart cath at follow-up to evaluate for new blockages.

## 2022-10-25 ENCOUNTER — Other Ambulatory Visit: Payer: Self-pay | Admitting: Cardiovascular Disease

## 2022-10-25 ENCOUNTER — Other Ambulatory Visit: Payer: Self-pay | Admitting: Family Medicine

## 2022-10-25 NOTE — Telephone Encounter (Signed)
Hi,  Please advise if ok to refill prescription under Dr. Kirke Corin as the prescribing physician? Prescription was last written by Dr. Sullivan Lone. Thank you so much.

## 2022-10-28 NOTE — Telephone Encounter (Signed)
Yes, that is fine. 

## 2022-10-29 ENCOUNTER — Ambulatory Visit (INDEPENDENT_AMBULATORY_CARE_PROVIDER_SITE_OTHER): Payer: Medicare Other

## 2022-10-29 DIAGNOSIS — I429 Cardiomyopathy, unspecified: Secondary | ICD-10-CM | POA: Diagnosis not present

## 2022-10-30 ENCOUNTER — Ambulatory Visit: Payer: Medicare Other | Admitting: Physical Therapy

## 2022-10-30 ENCOUNTER — Encounter: Payer: Medicare Other | Admitting: Physical Therapy

## 2022-10-30 DIAGNOSIS — R293 Abnormal posture: Secondary | ICD-10-CM

## 2022-10-30 DIAGNOSIS — M4125 Other idiopathic scoliosis, thoracolumbar region: Secondary | ICD-10-CM

## 2022-10-30 DIAGNOSIS — M545 Low back pain, unspecified: Secondary | ICD-10-CM

## 2022-10-30 DIAGNOSIS — R2689 Other abnormalities of gait and mobility: Secondary | ICD-10-CM | POA: Diagnosis not present

## 2022-10-30 DIAGNOSIS — N39498 Other specified urinary incontinence: Secondary | ICD-10-CM

## 2022-10-30 DIAGNOSIS — M533 Sacrococcygeal disorders, not elsewhere classified: Secondary | ICD-10-CM

## 2022-10-30 DIAGNOSIS — G8929 Other chronic pain: Secondary | ICD-10-CM | POA: Diagnosis not present

## 2022-10-30 LAB — CUP PACEART REMOTE DEVICE CHECK
Battery Remaining Longevity: 33 mo
Battery Voltage: 2.93 V
Brady Statistic AP VP Percent: 94.32 %
Brady Statistic AP VS Percent: 0 %
Brady Statistic AS VP Percent: 5.66 %
Brady Statistic AS VS Percent: 0.03 %
Brady Statistic RA Percent Paced: 93.91 %
Brady Statistic RV Percent Paced: 99.97 %
Date Time Interrogation Session: 20240625020609
Implantable Lead Connection Status: 753985
Implantable Lead Connection Status: 753985
Implantable Lead Implant Date: 20180214
Implantable Lead Implant Date: 20180214
Implantable Lead Location: 753859
Implantable Lead Location: 753860
Implantable Lead Model: 5076
Implantable Lead Model: 5076
Implantable Pulse Generator Implant Date: 20180214
Lead Channel Impedance Value: 323 Ohm
Lead Channel Impedance Value: 361 Ohm
Lead Channel Impedance Value: 399 Ohm
Lead Channel Impedance Value: 437 Ohm
Lead Channel Pacing Threshold Amplitude: 0.625 V
Lead Channel Pacing Threshold Amplitude: 0.75 V
Lead Channel Pacing Threshold Pulse Width: 0.4 ms
Lead Channel Pacing Threshold Pulse Width: 0.4 ms
Lead Channel Sensing Intrinsic Amplitude: 20.5 mV
Lead Channel Sensing Intrinsic Amplitude: 20.5 mV
Lead Channel Sensing Intrinsic Amplitude: 3.75 mV
Lead Channel Sensing Intrinsic Amplitude: 3.75 mV
Lead Channel Setting Pacing Amplitude: 2 V
Lead Channel Setting Pacing Amplitude: 2.5 V
Lead Channel Setting Pacing Pulse Width: 0.4 ms
Lead Channel Setting Sensing Sensitivity: 2 mV
Zone Setting Status: 755011
Zone Setting Status: 755011
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
## 2022-10-30 NOTE — Patient Instructions (Signed)
Seated : 1)  Segmental twist  10 reps both sides    2)   And hands back pressing by the hips , ground into the feet, hands, houlders down and back , lift hips up  10 reps

## 2022-10-31 ENCOUNTER — Ambulatory Visit: Payer: Medicare Other | Admitting: Physical Therapy

## 2022-10-31 DIAGNOSIS — H10402 Unspecified chronic conjunctivitis, left eye: Secondary | ICD-10-CM | POA: Diagnosis not present

## 2022-11-13 ENCOUNTER — Encounter: Payer: Medicare Other | Admitting: Physical Therapy

## 2022-11-14 ENCOUNTER — Other Ambulatory Visit
Admission: RE | Admit: 2022-11-14 | Discharge: 2022-11-14 | Disposition: A | Discharge status: Home / Self Care | Payer: Medicare Other | Source: Ambulatory Visit | Attending: Cardiovascular Disease | Admitting: Cardiovascular Disease

## 2022-11-14 ENCOUNTER — Ambulatory Visit: Payer: Medicare Other | Attending: Cardiovascular Disease | Admitting: Cardiovascular Disease

## 2022-11-14 VITALS — BP 122/84 | HR 79 | Ht 65.0 in | Wt 154.0 lb

## 2022-11-14 DIAGNOSIS — I4819 Other persistent atrial fibrillation: Secondary | ICD-10-CM | POA: Diagnosis not present

## 2022-11-14 DIAGNOSIS — I5022 Chronic systolic (congestive) heart failure: Secondary | ICD-10-CM | POA: Diagnosis not present

## 2022-11-14 DIAGNOSIS — I442 Atrioventricular block, complete: Secondary | ICD-10-CM

## 2022-11-14 DIAGNOSIS — G8929 Other chronic pain: Secondary | ICD-10-CM | POA: Diagnosis not present

## 2022-11-14 DIAGNOSIS — I25118 Atherosclerotic heart disease of native coronary artery with other forms of angina pectoris: Secondary | ICD-10-CM | POA: Diagnosis not present

## 2022-11-14 DIAGNOSIS — I429 Cardiomyopathy, unspecified: Secondary | ICD-10-CM | POA: Diagnosis not present

## 2022-11-14 DIAGNOSIS — M47814 Spondylosis without myelopathy or radiculopathy, thoracic region: Secondary | ICD-10-CM | POA: Diagnosis not present

## 2022-11-14 DIAGNOSIS — M5104 Intervertebral disc disorders with myelopathy, thoracic region: Secondary | ICD-10-CM | POA: Diagnosis not present

## 2022-11-14 DIAGNOSIS — I10 Essential (primary) hypertension: Secondary | ICD-10-CM

## 2022-11-14 DIAGNOSIS — M4804 Spinal stenosis, thoracic region: Secondary | ICD-10-CM | POA: Diagnosis not present

## 2022-11-14 DIAGNOSIS — E785 Hyperlipidemia, unspecified: Secondary | ICD-10-CM

## 2022-11-14 LAB — CBC
HCT: 43.5 % (ref 39.0–52.0)
Hemoglobin: 14.6 g/dL (ref 13.0–17.0)
MCH: 29.1 pg (ref 26.0–34.0)
MCHC: 33.6 g/dL (ref 30.0–36.0)
MCV: 86.7 fL (ref 80.0–100.0)
Platelets: 156 10*3/uL (ref 150–400)
RBC: 5.02 MIL/uL (ref 4.22–5.81)
RDW: 14 % (ref 11.5–15.5)
WBC: 4.9 10*3/uL (ref 4.0–10.5)
nRBC: 0 % (ref 0.0–0.2)

## 2022-11-14 LAB — BASIC METABOLIC PANEL
Anion gap: 7 (ref 5–15)
BUN: 31 mg/dL — ABNORMAL HIGH (ref 8–23)
CO2: 28 mmol/L (ref 22–32)
Calcium: 9.4 mg/dL (ref 8.9–10.3)
Chloride: 102 mmol/L (ref 98–111)
Creatinine, Ser: 1.59 mg/dL — ABNORMAL HIGH (ref 0.61–1.24)
GFR, Estimated: 41 mL/min — ABNORMAL LOW (ref >60)
Glucose, Bld: 102 mg/dL — ABNORMAL HIGH (ref 70–99)
Potassium: 4.7 mmol/L (ref 3.5–5.1)
Sodium: 137 mmol/L (ref 135–145)

## 2022-11-14 MED ORDER — DIPHENHYDRAMINE HCL 50 MG PO TABS: ORAL_TABLET | ORAL | 0 refills | Status: DC

## 2022-11-14 MED ORDER — PREDNISONE 50 MG PO TABS: ORAL_TABLET | ORAL | 0 refills | Status: DC
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
## 2022-11-14 NOTE — Patient Instructions (Addendum)
Medication Instructions:  No changes *If you need a refill on your cardiac medications before your next appointment, please call your pharmacy*  Testing/Procedures: Your physician has requested that you have a cardiac catheterization. Cardiac catheterization is used to diagnose and/or treat various heart conditions. Doctors may recommend this procedure for a number of different reasons. The most common reason is to evaluate chest pain. Chest pain can be a symptom of coronary artery disease (CAD), and cardiac catheterization can show whether plaque is narrowing or blocking your heart's arteries. This procedure is also used to evaluate the valves, as well as measure the blood flow and oxygen levels in different parts of your heart. For further information please visit [email or web address] Please follow instruction sheet, as given.    Follow-Up: At [facility], you and your health needs are our priority.  As part of our continuing mission to provide you with exceptional heart care, we have created designated Provider Care Teams.  These Care Teams include your primary Cardiologist (physician) and Advanced Practice Providers (APPs -  Physician Assistants and Nurse Practitioners) who all work together to provide you with the care you need, when you need it.  We recommend signing up for the patient portal called "MyChart".  Sign up information is provided on this After Visit Summary.  MyChart is used to connect with patients for Virtual Visits (Telemedicine).  Patients are able to view lab/test results, encounter notes, upcoming appointments, etc.  Non-urgent messages can be sent to your provider as well.   To learn more about what you can do with MyChart, go to [email or web address].    Your next appointment:   2-3 weeks  Provider:   You may see Thomas Bears, MD or one of the following Advanced Practice Providers on your designated Care Team:   Nicolasa Ducking, NP Eula Listen,  PA-C Cadence Fransico Michael, [place] Charlsie Quest, NP    Follow up with Dr. Graciela Husbands Other Instructions  [facility] A DEPT OF [place]. [facility] AT [facility] [address] [place] [address] Dept: [phone] Loc: [phone]  Thomas [name]  11/14/2022  You are scheduled for a Cardiac Catheterization on Friday, July 19 with Dr. Lorine [name].  1. Please arrive at the [facility] Entrance of [facility], [address], [place] [address] at 8:30 (This is 4 hour(s) prior to your procedure time).  Proceed to the Check-In Desk directly inside the entrance.  Procedure Parking: Use the entrance off of the [address] side of the hospital. Turn right upon entering and follow the driveway to parking that is directly in front of the [facility] Center. There is no valet parking available at this entrance, however there is an awning directly in front of the Heart & Vascular Center for drop off/ pick up for patients.  Special note: Every effort is made to have your procedure done on time. Please understand that emergencies sometimes delay scheduled procedures.  2. Diet: Do not eat solid foods after midnight.  The patient may have clear liquids until 5am upon the day of the procedure.  3. Labs: You will need to have blood drawn on 11/14/22.You do not need to be fasting.  4. Medication instructions in preparation for your procedure:  Please take Prednisone 50mg  by mouth at: Thirteen hours prior to cath  Seven hours prior to cath  And prior to leaving home please take last dose of Prednisone 50mg  and Benadryl 50mg   by mouth.   Hold the Eliquis two days prior and the morning of the procedure (hold the 17th and 18th)  On the morning of your procedure, take your Aspirin 81 mg and any morning medicines NOT listed above.  You may use sips of water.  5. Plan to go home the same day, you will only stay overnight if  medically necessary. 6. Bring a current list of your medications and current insurance cards. 7. You MUST have a responsible person to drive you home. 8. Someone MUST be with you the first 24 hours after you arrive home or your discharge will be delayed. 9. Please wear clothes that are easy to get on and off and wear slip-on shoes.  Thank you for allowing [place] to care for you!   -- [name]
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
## 2022-11-19 ENCOUNTER — Encounter: Payer: Medicare Other | Admitting: Physical Therapy

## 2022-11-19 ENCOUNTER — Ambulatory Visit: Payer: Medicare Other | Attending: Urology | Admitting: Physical Therapy

## 2022-11-19 DIAGNOSIS — N39498 Other specified urinary incontinence: Secondary | ICD-10-CM | POA: Insufficient documentation

## 2022-11-19 DIAGNOSIS — R293 Abnormal posture: Secondary | ICD-10-CM | POA: Insufficient documentation

## 2022-11-19 DIAGNOSIS — M533 Sacrococcygeal disorders, not elsewhere classified: Secondary | ICD-10-CM | POA: Diagnosis not present

## 2022-11-19 DIAGNOSIS — R2689 Other abnormalities of gait and mobility: Secondary | ICD-10-CM | POA: Insufficient documentation

## 2022-11-19 DIAGNOSIS — M4125 Other idiopathic scoliosis, thoracolumbar region: Secondary | ICD-10-CM | POA: Diagnosis not present

## 2022-11-19 DIAGNOSIS — M545 Low back pain, unspecified: Secondary | ICD-10-CM | POA: Diagnosis not present

## 2022-11-19 DIAGNOSIS — G8929 Other chronic pain: Secondary | ICD-10-CM | POA: Insufficient documentation
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
## 2022-11-20 NOTE — Progress Notes (Signed)
 Remote pacemaker transmission.   

## 2022-11-21 ENCOUNTER — Encounter: Payer: Medicare Other | Admitting: Physical Therapy

## 2022-11-22 ENCOUNTER — Telehealth: Payer: Self-pay | Admitting: *Deleted

## 2022-11-22 ENCOUNTER — Ambulatory Visit
Admission: RE | Admit: 2022-11-22 | Discharge: 2022-11-22 | Disposition: A | Discharge status: Home / Self Care | Payer: Medicare Other | Source: Home / Self Care | Attending: Cardiovascular Disease | Admitting: Cardiovascular Disease

## 2022-11-22 DIAGNOSIS — I509 Heart failure, unspecified: Secondary | ICD-10-CM

## 2022-11-22 MED ORDER — PREDNISONE 50 MG PO TABS: ORAL_TABLET | ORAL | 0 refills | Status: DC

## 2022-11-22 MED ORDER — DIPHENHYDRAMINE HCL 50 MG PO TABS: ORAL_TABLET | ORAL | 0 refills | Status: DC

## 2022-11-22 NOTE — Telephone Encounter (Signed)
Left a message for the patient to call back to reschedule his cardiac cath to Monday.

## 2022-11-22 NOTE — Telephone Encounter (Signed)
Spoke to the patient and his wife. The procedure has been rescheduled for Monday 7/22.   -The patient will arrive by 6:30 for pre-hydration.  -He will take his Eliquis tonight and tomorrow morning and then hold it from Saturday evening to Monday. -Prednisone and Benadryl have been resent in for him.
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
## 2022-11-22 NOTE — Telephone Encounter (Signed)
Left a message for the patient to call back.  

## 2022-11-25 ENCOUNTER — Other Ambulatory Visit: Payer: Self-pay

## 2022-11-25 ENCOUNTER — Encounter: Payer: Self-pay | Admitting: Cardiovascular Disease

## 2022-11-25 ENCOUNTER — Ambulatory Visit
Admission: RE | Admit: 2022-11-25 | Discharge: 2022-11-25 | Disposition: A | Discharge status: Home / Self Care | Payer: Medicare Other | Source: Ambulatory Visit | Attending: Cardiovascular Disease | Admitting: Cardiovascular Disease

## 2022-11-25 ENCOUNTER — Encounter
Admission: RE | Disposition: A | Discharge status: Home / Self Care | Payer: Self-pay | Source: Ambulatory Visit | Attending: Cardiovascular Disease

## 2022-11-25 DIAGNOSIS — Z85528 Personal history of other malignant neoplasm of kidney: Secondary | ICD-10-CM | POA: Insufficient documentation

## 2022-11-25 DIAGNOSIS — I5022 Chronic systolic (congestive) heart failure: Secondary | ICD-10-CM

## 2022-11-25 DIAGNOSIS — Z950 Presence of cardiac pacemaker: Secondary | ICD-10-CM | POA: Insufficient documentation

## 2022-11-25 DIAGNOSIS — I4892 Unspecified atrial flutter: Secondary | ICD-10-CM | POA: Diagnosis not present

## 2022-11-25 DIAGNOSIS — E785 Hyperlipidemia, unspecified: Secondary | ICD-10-CM | POA: Diagnosis not present

## 2022-11-25 DIAGNOSIS — I509 Heart failure, unspecified: Secondary | ICD-10-CM

## 2022-11-25 DIAGNOSIS — I442 Atrioventricular block, complete: Secondary | ICD-10-CM | POA: Diagnosis not present

## 2022-11-25 DIAGNOSIS — I4819 Other persistent atrial fibrillation: Secondary | ICD-10-CM | POA: Diagnosis not present

## 2022-11-25 DIAGNOSIS — I5023 Acute on chronic systolic (congestive) heart failure: Secondary | ICD-10-CM | POA: Diagnosis not present

## 2022-11-25 DIAGNOSIS — Z79899 Other long term (current) drug therapy: Secondary | ICD-10-CM | POA: Diagnosis not present

## 2022-11-25 DIAGNOSIS — Z7901 Long term (current) use of anticoagulants: Secondary | ICD-10-CM | POA: Insufficient documentation

## 2022-11-25 DIAGNOSIS — N189 Chronic kidney disease, unspecified: Secondary | ICD-10-CM | POA: Diagnosis not present

## 2022-11-25 DIAGNOSIS — I130 Hypertensive heart and chronic kidney disease with heart failure and stage 1 through stage 4 chronic kidney disease, or unspecified chronic kidney disease: Secondary | ICD-10-CM | POA: Diagnosis not present

## 2022-11-25 DIAGNOSIS — I2584 Coronary atherosclerosis due to calcified coronary lesion: Secondary | ICD-10-CM | POA: Diagnosis not present

## 2022-11-25 DIAGNOSIS — I25118 Atherosclerotic heart disease of native coronary artery with other forms of angina pectoris: Secondary | ICD-10-CM

## 2022-11-25 DIAGNOSIS — Z8673 Personal history of transient ischemic attack (TIA), and cerebral infarction without residual deficits: Secondary | ICD-10-CM | POA: Insufficient documentation

## 2022-11-25 HISTORY — PX: RIGHT/LEFT HEART CATH AND CORONARY ANGIOGRAPHY: CATH118266

## 2022-11-25 LAB — POCT I-STAT 7, (LYTES, BLD GAS, ICA,H+H)
Acid-base deficit: 1 mmol/L (ref 0.0–2.0)
Bicarbonate: 24.4 mmol/L (ref 20.0–28.0)
Calcium, Ion: 1.25 mmol/L (ref 1.15–1.40)
HCT: 44 % (ref 39.0–52.0)
Hemoglobin: 15 g/dL (ref 13.0–17.0)
O2 Saturation: 88 %
Potassium: 4.2 mmol/L (ref 3.5–5.1)
Sodium: 135 mmol/L (ref 135–145)
TCO2: 26 mmol/L (ref 22–32)
pCO2 arterial: 41.7 mmHg (ref 32–48)
pH, Arterial: 7.376 (ref 7.35–7.45)
pO2, Arterial: 56 mmHg — ABNORMAL LOW (ref 83–108)

## 2022-11-25 SURGERY — RIGHT/LEFT HEART CATH AND CORONARY ANGIOGRAPHY
Anesthesia: Moderate Sedation | Laterality: Bilateral

## 2022-11-25 MED ORDER — HEPARIN SODIUM (PORCINE) 1000 UNIT/ML IJ SOLN
INTRAMUSCULAR | Status: AC
  Filled 2022-11-25: qty 10

## 2022-11-25 MED ORDER — LIDOCAINE HCL 1 % IJ SOLN
INTRAMUSCULAR | Status: AC
  Filled 2022-11-25: qty 20

## 2022-11-25 MED ORDER — VERAPAMIL HCL 2.5 MG/ML IV SOLN
INTRAVENOUS | Status: DC | PRN
  Administered 2022-11-25: 2.5 mg via INTRA_ARTERIAL

## 2022-11-25 MED ORDER — SODIUM CHLORIDE 0.9% FLUSH: 3.0000 mL | Freq: Two times a day (BID) | INTRAVENOUS | Status: DC

## 2022-11-25 MED ORDER — SODIUM CHLORIDE 0.9% FLUSH: 3.0000 mL | INTRAVENOUS | Status: DC | PRN

## 2022-11-25 MED ORDER — HEPARIN SODIUM (PORCINE) 1000 UNIT/ML IJ SOLN
INTRAMUSCULAR | Status: DC | PRN
  Administered 2022-11-25: 3500 [IU] via INTRAVENOUS

## 2022-11-25 MED ORDER — MIDAZOLAM HCL 2 MG/2ML IJ SOLN
INTRAMUSCULAR | Status: DC | PRN
  Administered 2022-11-25: 1 mg via INTRAVENOUS

## 2022-11-25 MED ORDER — SODIUM CHLORIDE 0.9 % IV SOLN: INTRAVENOUS | Status: DC

## 2022-11-25 MED ORDER — MIDAZOLAM HCL 2 MG/2ML IJ SOLN
INTRAMUSCULAR | Status: AC
  Filled 2022-11-25: qty 2

## 2022-11-25 MED ORDER — LIDOCAINE HCL (PF) 1 % IJ SOLN
INTRAMUSCULAR | Status: DC | PRN
  Administered 2022-11-25: 5 mL

## 2022-11-25 MED ORDER — ASPIRIN 81 MG PO CHEW: 81.0000 mg | CHEWABLE_TABLET | ORAL | Status: DC

## 2022-11-25 MED ORDER — SODIUM CHLORIDE 0.9 % IV SOLN: 250.0000 mL | INTRAVENOUS | Status: DC | PRN

## 2022-11-25 MED ORDER — FENTANYL CITRATE (PF) 100 MCG/2ML IJ SOLN
INTRAMUSCULAR | Status: AC
  Filled 2022-11-25: qty 2

## 2022-11-25 MED ORDER — ACETAMINOPHEN 325 MG PO TABS: 650.0000 mg | ORAL_TABLET | ORAL | Status: DC | PRN

## 2022-11-25 MED ORDER — ONDANSETRON HCL 4 MG/2ML IJ SOLN: 4.0000 mg | Freq: Four times a day (QID) | INTRAMUSCULAR | Status: DC | PRN

## 2022-11-25 MED ORDER — VERAPAMIL HCL 2.5 MG/ML IV SOLN
INTRAVENOUS | Status: AC
  Filled 2022-11-25: qty 2

## 2022-11-25 MED ORDER — HEPARIN (PORCINE) IN NACL 1000-0.9 UT/500ML-% IV SOLN
INTRAVENOUS | Status: DC | PRN
  Administered 2022-11-25 (×2): 500 mL

## 2022-11-25 MED ORDER — FENTANYL CITRATE (PF) 100 MCG/2ML IJ SOLN
INTRAMUSCULAR | Status: DC | PRN
  Administered 2022-11-25: 50 ug via INTRAVENOUS

## 2022-11-25 MED ORDER — IOHEXOL 300 MG/ML  SOLN
INTRAMUSCULAR | Status: DC | PRN
  Administered 2022-11-25: 55 mL

## 2022-11-25 SURGICAL SUPPLY — 15 items
CATH BALLN WEDGE 5F 110CM (CATHETERS) IMPLANT
CATH INFINITI 5FR JK (CATHETERS) IMPLANT
CATH INFINITI JR4 5F (CATHETERS) IMPLANT
DEVICE RAD TR BAND REGULAR (VASCULAR PRODUCTS) IMPLANT
DRAPE BRACHIAL (DRAPES) IMPLANT
DRAPE FEMORAL ANGIO W/ POUCH (DRAPES) IMPLANT
GLIDESHEATH SLEND SS 6F .021 (SHEATH) IMPLANT
GUIDEWIRE .025 260CM (WIRE) IMPLANT
GUIDEWIRE INQWIRE 1.5J.035X260 (WIRE) IMPLANT
INQWIRE 1.5J .035X260CM (WIRE) ×1
PACK CARDIAC CATH (CUSTOM PROCEDURE TRAY) ×1 IMPLANT
PROTECTION STATION PRESSURIZED (MISCELLANEOUS) ×1
SET ATX-X65L (MISCELLANEOUS) IMPLANT
SHEATH GLIDE SLENDER 4/5FR (SHEATH) IMPLANT
STATION PROTECTION PRESSURIZED (MISCELLANEOUS) IMPLANT

## 2022-11-25 NOTE — Interval H&P Note (Signed)
History and Physical Interval Note:  11/25/2022 12:47 PM  Thomas [name]  has presented today for surgery, with the diagnosis of R and L Cath    Heart failure PT NEEDS 3 HRS HYDRATION   ARRIVE 6:30a.  The various methods of treatment have been discussed with the patient and family. After consideration of risks, benefits and other options for treatment, the patient has consented to  Procedure(s): RIGHT/LEFT HEART CATH AND CORONARY ANGIOGRAPHY (Bilateral) as a surgical intervention.  The patient's history has been reviewed, patient examined, no change in status, stable for surgery.  I have reviewed the patient's chart and labs.  Questions were answered to the patient's satisfaction.     Lorine Bears

## 2022-11-26 ENCOUNTER — Encounter: Payer: Medicare Other | Admitting: Physical Therapy

## 2022-11-26 ENCOUNTER — Encounter: Payer: Self-pay | Admitting: Cardiovascular Disease

## 2022-11-26 LAB — POCT I-STAT EG7
Acid-Base Excess: 0 mmol/L (ref 0.0–2.0)
Bicarbonate: 25.5 mmol/L (ref 20.0–28.0)
Calcium, Ion: 1.26 mmol/L (ref 1.15–1.40)
HCT: 44 % (ref 39.0–52.0)
Hemoglobin: 15 g/dL (ref 13.0–17.0)
O2 Saturation: 70 %
Potassium: 4.2 mmol/L (ref 3.5–5.1)
Sodium: 135 mmol/L (ref 135–145)
TCO2: 27 mmol/L (ref 22–32)
pCO2, Ven: 44.4 mmHg (ref 44–60)
pH, Ven: 7.367 (ref 7.25–7.43)
pO2, Ven: 38 mmHg (ref 32–45)

## 2022-11-28 ENCOUNTER — Encounter: Payer: Medicare Other | Admitting: Physical Therapy

## 2022-11-28 ENCOUNTER — Ambulatory Visit: Payer: Medicare Other | Admitting: Cardiovascular Disease

## 2022-11-28 DIAGNOSIS — H02526 Blepharophimosis left eye, unspecified eyelid: Secondary | ICD-10-CM | POA: Diagnosis not present

## 2022-12-03 ENCOUNTER — Ambulatory Visit: Payer: Medicare Other | Admitting: Physical Therapy

## 2022-12-03 ENCOUNTER — Telehealth: Payer: Self-pay | Admitting: Cardiovascular Disease

## 2022-12-03 DIAGNOSIS — M533 Sacrococcygeal disorders, not elsewhere classified: Secondary | ICD-10-CM

## 2022-12-03 DIAGNOSIS — R2689 Other abnormalities of gait and mobility: Secondary | ICD-10-CM

## 2022-12-03 DIAGNOSIS — M4125 Other idiopathic scoliosis, thoracolumbar region: Secondary | ICD-10-CM

## 2022-12-03 DIAGNOSIS — G8929 Other chronic pain: Secondary | ICD-10-CM

## 2022-12-03 DIAGNOSIS — R293 Abnormal posture: Secondary | ICD-10-CM

## 2022-12-03 DIAGNOSIS — N39498 Other specified urinary incontinence: Secondary | ICD-10-CM

## 2022-12-03 NOTE — Telephone Encounter (Signed)
Facility center calling to figure out when the pt is allowed to do exercises and if he can do cardiac rehab since he underwent the procedure on 7/22. Please advise and fax this number [phone] on when he can go back to band exercises and swimming.

## 2022-12-03 NOTE — Telephone Encounter (Signed)
His cardiac cath was more than a week ago.  Thus, no limitations in terms of physical activities or resistance bands. We could refer to cardiac rehab but the indication might not be covered given that no PCI was performed.

## 2022-12-03 NOTE — Telephone Encounter (Signed)
Physical therapist called Dr. Ivy Lynn office to find out:  1) pt's precautions s/p cath procedure 11/25/22 2) when pt can return to using resistance bands and swimming 3) is pt a good candidate for cardiac rehab program?   Staff routed questions to RN. Physical therapist awaits response and change POC accordingly.

## 2022-12-04 ENCOUNTER — Encounter: Payer: Self-pay | Admitting: Medical

## 2022-12-04 ENCOUNTER — Ambulatory Visit: Payer: Medicare Other | Attending: Medical | Admitting: Medical

## 2022-12-04 VITALS — BP 130/70 | HR 63 | Ht 65.0 in | Wt 151.4 lb

## 2022-12-04 DIAGNOSIS — I25118 Atherosclerotic heart disease of native coronary artery with other forms of angina pectoris: Secondary | ICD-10-CM

## 2022-12-04 DIAGNOSIS — I442 Atrioventricular block, complete: Secondary | ICD-10-CM

## 2022-12-04 DIAGNOSIS — I5022 Chronic systolic (congestive) heart failure: Secondary | ICD-10-CM | POA: Diagnosis not present

## 2022-12-04 DIAGNOSIS — Z950 Presence of cardiac pacemaker: Secondary | ICD-10-CM

## 2022-12-04 DIAGNOSIS — Z79899 Other long term (current) drug therapy: Secondary | ICD-10-CM | POA: Diagnosis not present

## 2022-12-04 DIAGNOSIS — I480 Paroxysmal atrial fibrillation: Secondary | ICD-10-CM

## 2022-12-04 MED ORDER — DAPAGLIFLOZIN PROPANEDIOL 10 MG PO TABS: 10.0000 mg | ORAL_TABLET | Freq: Every day | ORAL | 3 refills | Status: DC

## 2022-12-04 NOTE — Patient Instructions (Signed)
Medication Instructions:  Your physician recommends the following medication changes.  START TAKING: Farxiga 10 mg by mouth daily   *If you need a refill on your cardiac medications before your next appointment, please call your pharmacy*   Lab Work: Your provider would like for you to return in 2 weeks to have the following labs drawn: (BMP).   Please go to [facility] [address] ([facility] [address], [place] [address] You do not need an appointment.  They are open from 7:30 am-4 pm.  Lunch from 1:00 pm- 2:00 pm You will not need to be fasting.   You may also go to any of these [facility] locations:  [employer]  - [address] [employer] [address] [employer] Officer)   Testing/Procedures: None ordered today   Follow-Up: At [facility], you and your health needs are our priority.  As part of our continuing mission to provide you with exceptional heart care, we have created designated Provider Care Teams.  These Care Teams include your primary Cardiologist (physician) and Advanced Practice Providers (APPs -  Physician Assistants and Nurse Practitioners) who all work together to provide you with the care you need, when you need it.  We recommend signing up for the patient portal called "MyChart".  Sign up information is provided on this After Visit Summary.  MyChart is used to connect with patients for Virtual Visits (Telemedicine).  Patients are able to view lab/test results, encounter notes, upcoming appointments, etc.  Non-urgent messages can be sent to your provider as well.   To learn more about what you can do with MyChart, go to [email or web address].    Your next appointment:   3-4 week(s)  Provider:   You may see Lorine Bears, MD or one of the following Advanced Practice Providers on your designated Care Team:   Nicolasa Ducking, NP Eula Listen, PA-C Cadence Fransico Michael, PA-C Charlsie Quest, NP

## 2022-12-04 NOTE — Progress Notes (Signed)
Cardiology Office Note:    Date:  12/04/2022   ID:  Thomas [name], DOB [birth year], MRN [id number]  PCP:  Bosie Clos, MD  [facility] Cardiologist:  Lorine Bears, MD  [facility] Electrophysiologist:  Sherryl Manges, MD   Referring MD: Bosie Clos, MD   Chief Complaint: Heart cath follow-up  History of Present Illness:    Thomas [name] is a 89 y.o. male with a hx of  CHB s/p PPM 06/2016, pAfib/flutter, CAD, cardiomyopathy, TIA, CKD stage III with partial nephrectomy due to cancer, hyperlipidemia, GERD, HTN who is being seen for heart catheterization follow-up.   Echo in 2019 showed low normal LVSF with an EF of 50 to 55% with anterior septal wall motion consistent with paced rhythm.  Coronary CTA in 09/2017 showed a calcium score 1366 with possible significant stenosis in the mid LAD and a 50% stenosis in the mid left circumflex.  The RCA cannot be visualized due to pacemaker artifact.  Left heart cath in 10/2017 showed borderline three-vessel CAD with 60% mid LAD stenosis, 50% proximal left circumflex stenosis, and 70% mid RCA stenosis.  Coronary arteries were noted to be moderately calcified overall.  LVEDP was 18 mmHg.  No revascularization was performed.  Echo in October 2020 showed a slight drop in his LV function, 45 to 50% with global hypokinesis.  Echo in November 2021 showed EF of 40 to 45%, grade 1 diastolic dysfunction.   Patient underwent heart cath in 2022 for exertional dyspnea and chest tightness.  This showed stable calcified moderate three-vessel CAD, normal LVSF and normal LVEDP.  Disease did not require revascularization.  The patient has a history of Afib on Tikosyn and Eliquis, followed by EP.  Repeat echo in 10/2022 for h/o CM showed reduced LVEF 30-35%. The patient was seen back in follow-up reporting exertional chest pain and tightness. He was set up for a cardiac catheterization, which showed stable borderline 3V CAD, RHC showed normal right and left  sided filling pressures, normal pulmonary pressure and normal cardiac output. Plan to see EP to evaluate for BIV pacing.  Fractional flow reserve evaluation of the right coronary artery left circumflex with possible PCI will be reserved for refractory angina.  Today, the cardiac cath was briefly reviewed. The patient is overall doing well. When he does speed walking, he seems to get uncomfortable in his chest. He also does PT and is unsure if it's MSK or angina. Also has soreness in his chest area when he palpates or moves his arm. He denies LLE, orthopnea, or pnd. He takes Toprol daily and he takes propranolol twice a week for afib/breakthrough palpitations. Cath site is stable.    Past Medical History:  Diagnosis Date   Aortic valve disorders    Arthritis    Atrial flutter (HCC) 06/18/2016   "AF or AFl; not sure which" (06/23/2016)   Basal cell carcinoma    "face, nose left shoulder, left arm" (06/19/2016)   Basal cell carcinoma 09/13/2020   right temple   BBB (bundle branch block)    hx right   Chronic back pain    "neck, thoracic, lower back" (06/19/2016)   Complete heart block (HCC) 06/2016   Dyspnea    GERD (gastroesophageal reflux disease)    Gout    Heart block    "I've had type I, II Wenke before now" (06/19/2016)   History of gout    History of hiatal hernia    "self dx'd" (06/19/2016)  Hyperlipidemia    Hypertension    Lyme disease    "dx'd by me 2003; cx's showed dx 08/2015"   Migraine    "3-4/year" (06/19/2016)   Presence of permanent cardiac pacemaker 06/19/2016   PVC's (premature ventricular contractions)    Renal cancer, left (HCC) 2006   S/P cryotherapy   Spinal stenosis    "cervical, 1 thoracic, lumbar" (06/19/2016)   Squamous carcinoma    "face, nose left shoulder, left arm" (06/19/2016)   Stroke (HCC)    TIA (transient ischemic attack) 06/14/2016   "I'm not sure that's what it was" (06/25/2016)   Visit for monitoring Tikosyn therapy 09/09/2017    Past  Surgical History:  Procedure Laterality Date   ANKLE FRACTURE SURGERY Right 1967   BACK SURGERY  05/07/2020   BASAL CELL CARCINOMA EXCISION     "face, nose left shoulder, left arm"   BIOPSY PROSTATE  2001 & 2003   CARDIAC CATHETERIZATION  1990's   CARDIOVERSION N/A 09/11/2017   Procedure: CARDIOVERSION;  Surgeon: Lewayne Bunting, MD;  Location: [facility];  Service: Cardiovascular;  Laterality: N/A;   FRACTURE SURGERY     HOLEP-LASER ENUCLEATION OF THE PROSTATE WITH MORCELLATION N/A 07/10/2020   Procedure: HOLEP-LASER ENUCLEATION OF THE PROSTATE WITH MORCELLATION;  Surgeon: Vanna Scotland, MD;  Location: [facility];  Service: Urology;  Laterality: N/A;   INGUINAL HERNIA REPAIR Left 2012   INSERT / REPLACE / REMOVE PACEMAKER  06/19/2016   LAPAROSCOPIC ABLATION RENAL MASS     LEFT HEART CATH AND CORONARY ANGIOGRAPHY Left 10/23/2017   Procedure: LEFT HEART CATH AND CORONARY ANGIOGRAPHY;  Surgeon: Iran Ouch, MD;  Location: [facility] INVASIVE CV LAB;  Service: Cardiovascular;  Laterality: Left;   LEFT HEART CATH AND CORONARY ANGIOGRAPHY Left 02/26/2021   Procedure: LEFT HEART CATH AND CORONARY ANGIOGRAPHY;  Surgeon: Iran Ouch, MD;  Location: [facility] INVAS[facility] CV LAB;  Service: Cardiovascular;  Laterality: Left;   PACEMAKER IMPLANT N/A 06/19/2016   Procedure: Pacemaker Implant;  Surgeon: Duke Salvia, MD;  Location: [facility] INVASIVE CV LAB;  Service: Cardiovascular;  Laterality: N/A;   pacemasker     PROSTATE SURGERY     RIGHT/LEFT HEART CATH AND CORONARY ANGIOGRAPHY Bilateral 11/25/2022   Procedure: RIGHT/LEFT HEART CATH AND CORONARY ANGIOGRAPHY;  Surgeon: Iran Ouch, MD;  Location: [facility] INVASIVE CV LAB;  Service: Cardiovascular;  Laterality: Bilateral;   SQUAMOUS CELL CARCINOMA EXCISION     "face, nose left shoulder, left arm"   TONSILLECTOMY AND ADENOIDECTOMY      Current Medications: Current Meds  Medication Sig   amLODipine (NORVASC) 5 MG tablet TAKE 1 TABLET BY MOUTH DAILY    atorvastatin (LIPITOR) 40 MG tablet Take 1 tablet (40 mg total) by mouth daily.   calcium carbonate (TUMS - DOSED IN MG ELEMENTAL CALCIUM) 500 MG chewable tablet Chew 1-2 tablets by mouth daily as needed for indigestion.   Cholecalciferol (VITAMIN D3) 5000 units CAPS Take 5,000 Units by mouth daily.   Coenzyme Q10 (COQ10) 100 MG CAPS Take 100 mg by mouth daily.   Cyanocobalamin (VITAMIN B-12 PO) Take 3,000 mcg by mouth daily with breakfast.   dapagliflozin propanediol (FARXIGA) 10 MG TABS tablet Take 1 tablet (10 mg total) by mouth daily before breakfast.   diphenhydrAMINE (BENADRYL) 50 MG tablet Take one 50 mg tablet the morning of the procedure with the last dose of the prednisone.   dofetilide (TIKOSYN) 125 MCG capsule Take 1 capsule (125 mcg total) by mouth 2 (two) times  daily.   ELIQUIS 5 MG TABS tablet TAKE 1 TABLET BY MOUTH TWICE A DAY   febuxostat (ULORIC) 40 MG tablet Take 1 tablet (40 mg total) by mouth daily.   gabapentin (NEURONTIN) 300 MG capsule Take 300 mg by mouth at bedtime.   Magnesium 500 MG TABS Take 500 mg by mouth at bedtime.   methocarbamol (ROBAXIN) 500 MG tablet Take 500 mg by mouth 3 (three) times daily.   metoprolol succinate (TOPROL-XL) 50 MG 24 hr tablet TAKE 1 TABLET BY MOUTH DAILY WITH OR IMMEDIATELY FOLLOWING A MEAL   metroNIDAZOLE (FLAGYL) 500 MG tablet TAKE 1 TABLET BY MOUTH TWICE DAILY ON SUNDAY AND MONDAY.   mirabegron ER (MYRBETRIQ) 25 MG TB24 tablet Take 1 tablet (25 mg total) by mouth daily.   Multiple Vitamins-Minerals (PRESERVISION/LUTEIN PO) Take 1 capsule by mouth in the morning.   NEEDLE, DISP, 18 G (BD DISP NEEDLES) 18G X 1-1/2" MISC 1 mg by Does not apply route every 14 (fourteen) days.   NEEDLE, DISP, 21 G (BD DISP NEEDLES) 21G X 1-1/2" MISC 1 mg by Does not apply route every 14 (fourteen) days.   omeprazole (PRILOSEC) 20 MG capsule TAKE ONE CAPSULE BY MOUTH EVERY DAY FOR HEARTBURN TAKE 30 MINUTES BEFORE A MEAL   oxyCODONE (OXY IR/ROXICODONE) 5 MG  immediate release tablet Take 1 tablet (5 mg total) by mouth every 6 (six) hours as needed for moderate pain or breakthrough pain.   oxyCODONE-acetaminophen (PERCOCET) 7.5-325 MG tablet Take 1-2 tablets by mouth every 4 (four) hours as needed for severe pain.   polyethylene glycol (MIRALAX / GLYCOLAX) 17 g packet Take 17 g by mouth daily as needed for moderate constipation.   propranolol (INDERAL) 40 MG tablet Take 1 tablet (40 mg total) by mouth 3 (three) times daily.   sildenafil (REVATIO) 20 MG tablet Take 3-5 tablets 1 hr prior   Syringe, Disposable, (2-3CC SYRINGE) 3 ML MISC 1 mg by Does not apply route every 14 (fourteen) days.   tamsulosin (FLOMAX) 0.4 MG CAPS capsule Take 0.4 mg by mouth at bedtime.   terazosin (HYTRIN) 1 MG capsule TAKE ONE CAPSULE BY MOUTH AT BEDTIME.   terbinafine (LAMISIL) 250 MG tablet Take 1 tablet (250 mg total) by mouth daily.   Testosterone 1.62 % GEL APPLY 2 PUMPS DAILY   testosterone cypionate (DEPOTESTOSTERONE CYPIONATE) 200 MG/ML injection Inject 1 mL (200 mg total) into the muscle every 28 (twenty-eight) days.   traMADol (ULTRAM) 50 MG tablet Take 50-100 mg by mouth 4 (four) times daily as needed for severe pain.   Turmeric 500 MG CAPS Take 500 mg by mouth daily.     Allergies:   Other, Iodine, and Penicillins   Social History   Socioeconomic History   Marital status: Married    Spouse name: Not on file   Number of children: 3   Years of education: MD    Highest education level: Master's degree (e.g., MA, MS, MEng, MEd, MSW, MBA)  Occupational History   Occupation: Retired   Tobacco Use   Smoking status: Never   Smokeless tobacco: Never  Vaping Use   Vaping status: Never Used  Substance and Sexual Activity   Alcohol use: Yes    Alcohol/week: 1.0 standard drink of alcohol    Types: 1 Glasses of wine per week   Drug use: No   Sexual activity: Yes  Other Topics Concern   Not on file  Social History Narrative   Independent at baseline. Lives  with family   Drinks tea in the morning    Social Determinants of Health   Financial Resource Strain: Low Risk  (09/26/2021)   Overall Financial Resource Strain (CARDIA)    Difficulty of Paying Living Expenses: Not hard at all  Food Insecurity: No Food Insecurity (09/26/2021)   Hunger Vital Sign    Worried About Running Out of Food in the Last Year: Never true    Ran Out of Food in the Last Year: Never true  Transportation Needs: No Transportation Needs (09/26/2021)   PRAPARE - Administrator, Civil Service (Medical): No    Lack of Transportation (Non-Medical): No  Physical Activity: Sufficiently Active (09/26/2021)   Exercise Vital Sign    Days of Exercise per Week: 5 days    Minutes of Exercise per Session: 50 min  Stress: No Stress Concern Present (09/26/2021)   [employer] of Occupational Health - Occupational Stress Questionnaire    Feeling of Stress : Not at all  Social Connections: Socially Integrated (09/26/2021)   Social Connection and Isolation Panel [NHANES]    Frequency of Communication with Friends and Family: Twice a week    Frequency of Social Gatherings with Friends and Family: Twice a week    Attends Religious Services: 1 to 4 times per year    Active Member of [employer] or Organizations: Yes    Attends Engineer, structural: More than 4 times per year    Marital Status: Married     Family History: The patient's family history includes Aortic stenosis in his mother; Arthritis in his father; Heart attack in his brother; Stroke in his brother.  ROS:   Please see the history of present illness.     All other systems reviewed and are negative.  EKGs/Labs/Other Studies Reviewed:    The following studies were reviewed today:  R/L heart cath 11/2022    Mid LAD lesion is 60% stenosed.   Dist RCA lesion is 40% stenosed with 40% stenosed side branch in RPAV.   Prox RCA lesion is 30% stenosed.   Mid RCA lesion is 70% stenosed.   Ost 2nd Mrg to 2nd  Mrg lesion is 40% stenosed.   Ost LAD to Prox LAD lesion is 40% stenosed.   Prox Cx to Mid Cx lesion is 65% stenosed.   1.  Stable borderline three-vessel coronary artery disease.  The coronary arteries are moderately calcified overall. 2.  Left ventricular angiography was not performed due to chronic kidney disease.  EF was moderately reduced by echo. 3.  Right heart catheterization showed normal right and left-sided filling pressures, normal pulmonary pressure and normal cardiac output.   Recommendations: Recommend continuing aggressive medical therapy.  I do not think revascularization will be associated with improved LV systolic function.  The patient will be seen by EP to discuss upgrading his device to biventricular pacemaker. Fractional flow reserve evaluation of the right coronary artery and left circumflex with possible PCI will be reserved for refractory angina.    1. Left ventricular ejection fraction, by estimation, is 30 to 35%. The  left ventricle has moderate to severely decreased function. The left  ventricle demonstrates global hypokinesis. There is mild left ventricular  hypertrophy. Left ventricular  diastolic parameters are indeterminate.   2. Right ventricular systolic function is normal. The right ventricular  size is normal.   3. The mitral valve is normal in structure. Mild mitral valve  regurgitation.   4. The aortic valve is tricuspid. Aortic valve  regurgitation is mild to  moderate. Aortic valve sclerosis/calcification is present, without any  evidence of aortic stenosis.   5. Aortic dilatation noted. There is mild dilatation of the aortic root,  measuring 39 mm.   6. The inferior vena cava is normal in size with greater than 50%  respiratory variability, suggesting right atrial pressure of 3 mmHg.   Echo 2021 1. Left ventricular ejection fraction, by estimation, is 45 to 50%. The  left ventricle has mildly decreased function. The left ventricle   demonstrates global hypokinesis. There is mild left ventricular  hypertrophy. Left ventricular diastolic parameters  are consistent with Grade I diastolic dysfunction (impaired relaxation).  Elevated left atrial pressure. The average left ventricular global  longitudinal strain is -9.9 %. The global longitudinal strain is abnormal.   2. Right ventricular systolic function is low normal. The right  ventricular size is normal. There is normal pulmonary artery systolic  pressure.   3. Right atrial size was mildly dilated.   4. The mitral valve is normal in structure. Mild to moderate mitral valve  regurgitation. No evidence of mitral stenosis.   5. The aortic valve is tricuspid. There is mild calcification of the  aortic valve. There is mild thickening of the aortic valve. Aortic valve  regurgitation is mild. Mild to moderate aortic valve  sclerosis/calcification is present, without any evidence  of aortic stenosis.   6. Pulmonic valve regurgitation not well assessed.   7. The inferior vena cava is normal in size with greater than 50%  respiratory variability, suggesting right atrial pressure of 3 mmHg.   EKG:  EKG is ordered today.  The ekg ordered today demonstrates AV paced rhythm 63bpm  Recent Labs: 01/09/2022: ALT 15; TSH 2.420 10/14/2022: Magnesium 2.3 11/14/2022: BUN 31; Creatinine, Ser 1.59; Platelets 156 11/25/2022: Hemoglobin 15.0; Potassium 4.2; Sodium 135  Recent Lipid Panel    Component Value Date/Time   CHOL 133 01/09/2022 1425   CHOL 144 06/26/2012 1106   TRIG 39 01/09/2022 1425   TRIG 46 06/26/2012 1106   HDL 67 01/09/2022 1425   HDL 61 (H) 06/26/2012 1106   CHOLHDL 2.0 01/09/2022 1425   CHOLHDL 2.4 06/14/2016 1346   VLDL 8 06/14/2016 1346   VLDL 9 06/26/2012 1106   LDLCALC 56 01/09/2022 1425   LDLCALC 74 06/26/2012 1106     Physical Exam:    VS:  BP 130/70 (BP Location: Left Arm, Patient Position: Sitting, Cuff Size: Normal)   Pulse 63   Ht 5\' 5"  (1.651 m)    Wt 151 lb 6.4 oz (68.7 kg)   SpO2 97%   BMI 25.19 kg/m     Wt Readings from Last 3 Encounters:  12/04/22 151 lb 6.4 oz (68.7 kg)  11/25/22 153 lb (69.4 kg)  11/14/22 154 lb (69.9 kg)     GEN:  Well nourished, well developed in no acute distress HEENT: Normal NECK: No JVD; No carotid bruits LYMPHATICS: No lymphadenopathy CARDIAC: RRR, no murmurs, rubs, gallops RESPIRATORY:  Clear to auscultation without rales, wheezing or rhonchi  ABDOMEN: Soft, non-tender, non-distended MUSCULOSKELETAL:  No edema; No deformity  SKIN: Warm and dry NEUROLOGIC:  Alert and oriented x 3 PSYCHIATRIC:  Normal affect   ASSESSMENT:    1. Coronary artery disease involving native coronary artery of native heart with other form of angina pectoris (HCC)   2. Chronic systolic heart failure (HCC)   3. Paroxysmal atrial fibrillation (HCC)   4. Complete heart block (HCC)   5. Cardiac  pacemaker in situ   6. Medication management    PLAN:    In order of problems listed above:  CAD Recent cath showed stable borderline three-vessel CAD, moderately calcified coronary arteries overall.  Fractional flow reserve evaluation of the RCA and left circumflex with possible PCI will be reserved for refractory angina.  Patient reports mostly musculoskeletal chest pain. He does have some exertional dyspnea, which appears to be only on severe exertion.  We will continue medical management for now.  Continue Lipitor 40 mg daily, Toprol 50 mg daily.  No aspirin given Eliquis.  HFrEF Recent echo showed reduced EF 30 to 35%.  Heart cath showed stable three-vessel CAD as above.  Right heart cath showed normal right and left-sided filling pressures, normal pulmonary pressure and normal cardiac output.  Patient is euvolemic on exam today.  I will add on Farxiga 10 mg daily, BMET in 2 weeks.  Continue Toprol 50 mg daily. We will continue GDMT as able. This may be limited by CKD and partial nephrectomy, and this he not tolerate  Entresto/Acei/ARB.  Paroxysmal Afib Patient is taking Tikosyn 125 mcg twice daily.  He takes Eliquis for stroke prophylaxis.  Patient sees EP tomorrow.  Patient also takes propranolol as needed for breakthrough palpitations.  CHB s/p Medtronic PPM As above, appointment tomorrow with EP to discuss possible biventricular pacing.  Disposition: Follow up in 1 month(s) with MD/APP    Signed, Kissie Ziolkowski David Stall, PA-C  12/04/2022 9:17 AM    [facility]
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
## 2022-12-05 ENCOUNTER — Ambulatory Visit: Payer: Medicare Other | Attending: Internal Medicine | Admitting: Internal Medicine

## 2022-12-05 ENCOUNTER — Encounter: Payer: Self-pay | Admitting: Internal Medicine

## 2022-12-05 ENCOUNTER — Encounter: Payer: Medicare Other | Admitting: Physical Therapy

## 2022-12-05 VITALS — BP 130/74 | HR 65 | Ht 65.0 in | Wt 153.8 lb

## 2022-12-05 DIAGNOSIS — I480 Paroxysmal atrial fibrillation: Secondary | ICD-10-CM

## 2022-12-05 DIAGNOSIS — Z950 Presence of cardiac pacemaker: Secondary | ICD-10-CM | POA: Diagnosis not present

## 2022-12-05 DIAGNOSIS — I442 Atrioventricular block, complete: Secondary | ICD-10-CM

## 2022-12-05 MED ORDER — SACUBITRIL-VALSARTAN 24-26 MG PO TABS: 1.0000 | ORAL_TABLET | Freq: Two times a day (BID) | ORAL | 3 refills | Status: DC

## 2022-12-05 MED ORDER — APIXABAN 2.5 MG PO TABS: 2.5000 mg | ORAL_TABLET | Freq: Two times a day (BID) | ORAL | 3 refills | Status: DC
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
## 2022-12-05 NOTE — Patient Instructions (Addendum)
Medication Instructions:  STOP Amlodipine  STOP Propanolol   START Entresto 24/26 twice daily  DECREASE Eliquis to 2.5 mg twice daily   *If you need a refill on your cardiac medications before your next appointment, please call your pharmacy*   Lab Work: Your provider would like for you to have following labs drawn in 2 weeks BMET, CBC.   If you have labs (blood work) drawn today and your tests are completely normal, you will receive your results only by: MyChart Message (if you have MyChart) OR A paper copy in the mail If you have any lab test that is abnormal or we need to change your treatment, we will call you to review the results.   Testing/Procedures: CRT or cardiac resynchronization therapy is a treatment used to correct heart failure. When you have heart failure your heart is weakened and doesn't pump as well as it should. This therapy may help reduce symptoms and improve the quality of life.  Please see the handout/brochure given to you today to get more information of the different options of therapy.   Follow-Up: At [facility], you and your health needs are our priority.  As part of our continuing mission to provide you with exceptional heart care, we have created designated Provider Care Teams.  These Care Teams include your primary Cardiologist (physician) and Advanced Practice Providers (APPs -  Physician Assistants and Nurse Practitioners) who all work together to provide you with the care you need, when you need it.  We recommend signing up for the patient portal called "MyChart".  Sign up information is provided on this After Visit Summary.  MyChart is used to connect with patients for Virtual Visits (Telemedicine).  Patients are able to view lab/test results, encounter notes, upcoming appointments, etc.  Non-urgent messages can be sent to your provider as well.   To learn more about what you can do with MyChart, go to [email or web address].    Your next  appointment:   To be scheduled

## 2022-12-09 ENCOUNTER — Encounter: Payer: Self-pay | Admitting: Internal Medicine

## 2022-12-09 ENCOUNTER — Encounter: Payer: Self-pay | Admitting: Physical Therapy

## 2022-12-10 ENCOUNTER — Ambulatory Visit: Payer: Medicare Other | Admitting: Physical Therapy

## 2022-12-12 ENCOUNTER — Encounter: Payer: Medicare Other | Admitting: Physical Therapy

## 2022-12-16 ENCOUNTER — Encounter: Payer: Self-pay | Admitting: Internal Medicine

## 2022-12-16 DIAGNOSIS — I4819 Other persistent atrial fibrillation: Secondary | ICD-10-CM

## 2022-12-16 DIAGNOSIS — I5022 Chronic systolic (congestive) heart failure: Secondary | ICD-10-CM

## 2022-12-17 ENCOUNTER — Ambulatory Visit: Payer: Medicare Other | Admitting: Physical Therapy

## 2022-12-17 ENCOUNTER — Ambulatory Visit: Payer: Medicare Other | Admitting: Internal Medicine

## 2022-12-17 DIAGNOSIS — I480 Paroxysmal atrial fibrillation: Secondary | ICD-10-CM | POA: Diagnosis not present

## 2022-12-17 DIAGNOSIS — I442 Atrioventricular block, complete: Secondary | ICD-10-CM | POA: Diagnosis not present

## 2022-12-19 ENCOUNTER — Telehealth: Payer: Self-pay | Admitting: Internal Medicine

## 2022-12-19 ENCOUNTER — Encounter: Payer: Medicare Other | Admitting: Physical Therapy

## 2022-12-19 NOTE — Telephone Encounter (Signed)
error 

## 2022-12-20 ENCOUNTER — Encounter: Payer: Self-pay | Admitting: Internal Medicine

## 2022-12-24 ENCOUNTER — Ambulatory Visit: Payer: Medicare Other | Admitting: Physical Therapy

## 2022-12-25 ENCOUNTER — Other Ambulatory Visit: Payer: Self-pay | Admitting: Urology

## 2022-12-25 DIAGNOSIS — E291 Testicular hypofunction: Secondary | ICD-10-CM

## 2022-12-25 MED ORDER — TESTOSTERONE 1.62 % TD GEL
2.0000 | Freq: Every day | TRANSDERMAL | 3 refills | Status: DC
Start: 2022-12-25 — End: 2023-03-09

## 2022-12-26 ENCOUNTER — Encounter: Payer: Medicare Other | Admitting: Physical Therapy

## 2022-12-31 ENCOUNTER — Ambulatory Visit: Payer: Medicare Other | Admitting: Physical Therapy

## 2023-01-02 ENCOUNTER — Encounter: Payer: Medicare Other | Admitting: Physical Therapy

## 2023-01-07 NOTE — Progress Notes (Signed)
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 function.  Continue aggressive risk factor modification and current medical therapy including apixaban in place of aspirin given underlying atrial flutter, and metoprolol.  No radial arteriotomy site complications.  HFrEF: Euvolemic and well compensated.  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  Some marginal improvement in functional status on Entresto, would look to escalate this post device upgrade as tolerated.  Remains on Toprol-XL.  Not on MRA or SGLT2 inhibitor in the setting of underlying renal dysfunction.  Not requiring a standing loop diuretic.  Complete heart block: Status post PPM with plans for device upgrade to BiV in 2 days.  Follow-up with EP as directed.  Persistent A-fib: Maintaining sinus rhythm by exam.  CHADS2VASc 7 (CHF, HTN, age x 2, TIA x 2, vascular disease). Remains on apixaban 2.5 mg twice daily (age,  serum creatinine) and dofetilide.  Most recent potassium and magnesium at goal.  Ongoing management per EP.  HTN: Blood pressure mildly elevated in the office today, though typically well-controlled.  Medical therapy as outlined above.  HLD: LDL 56.  Continue atorvastatin.  CKD stage IIIb with partial nephrectomy: Check BMP today.    Disposition: F/u with Dr. Kirke Corin or an APP in 3 months, and EP as directed post device upgrade.    Medication Adjustments/Labs and Tests Ordered: Current medicines are reviewed at length with the patient today.  Concerns regarding medicines are outlined above. Medication changes, Labs and Tests ordered today are summarized above and listed in the Patient Instructions accessible in Encounters.   Signed, Eula Listen, PA-C 01/08/2023 3:32 PM      [facility] [place] [address] [place], [place] [address] [phone]

## 2023-01-08 ENCOUNTER — Encounter: Payer: Self-pay | Admitting: Physician Assistant

## 2023-01-08 ENCOUNTER — Ambulatory Visit: Payer: Medicare Other | Attending: Physician Assistant | Admitting: Physician Assistant

## 2023-01-08 VITALS — BP 140/78 | HR 67 | Ht 65.0 in | Wt 152.6 lb

## 2023-01-08 DIAGNOSIS — I5020 Unspecified systolic (congestive) heart failure: Secondary | ICD-10-CM

## 2023-01-08 DIAGNOSIS — I4819 Other persistent atrial fibrillation: Secondary | ICD-10-CM

## 2023-01-08 DIAGNOSIS — N1832 Chronic kidney disease, stage 3b: Secondary | ICD-10-CM | POA: Diagnosis not present

## 2023-01-08 DIAGNOSIS — Z950 Presence of cardiac pacemaker: Secondary | ICD-10-CM | POA: Diagnosis not present

## 2023-01-08 DIAGNOSIS — E785 Hyperlipidemia, unspecified: Secondary | ICD-10-CM

## 2023-01-08 DIAGNOSIS — I442 Atrioventricular block, complete: Secondary | ICD-10-CM

## 2023-01-08 DIAGNOSIS — I10 Essential (primary) hypertension: Secondary | ICD-10-CM

## 2023-01-08 DIAGNOSIS — I25118 Atherosclerotic heart disease of native coronary artery with other forms of angina pectoris: Secondary | ICD-10-CM | POA: Diagnosis not present

## 2023-01-08 NOTE — Patient Instructions (Signed)
Medication Instructions:  Your Physician recommend you continue on your current medication as directed.    *If you need a refill on your cardiac medications before your next appointment, please call your pharmacy*  Lab Work: Your provider would like for you to have following labs drawn today BMET.   If you have labs (blood work) drawn today and your tests are completely normal, you will receive your results only by: MyChart Message (if you have MyChart) OR A paper copy in the mail If you have any lab test that is abnormal or we need to change your treatment, we will call you to review the results.  Follow-Up: At [facility], you and your health needs are our priority.  As part of our continuing mission to provide you with exceptional heart care, we have created designated Provider Care Teams.  These Care Teams include your primary Cardiologist (physician) and Advanced Practice Providers (APPs -  Physician Assistants and Nurse Practitioners) who all work together to provide you with the care you need, when you need it.  We recommend signing up for the patient portal called "MyChart".  Sign up information is provided on this After Visit Summary.  MyChart is used to connect with patients for Virtual Visits (Telemedicine).  Patients are able to view lab/test results, encounter notes, upcoming appointments, etc.  Non-urgent messages can be sent to your provider as well.   To learn more about what you can do with MyChart, go to [email or web address].    Your next appointment:   5-6 Weeks   Provider:   You may see Levy Sjogren, PA-C  Your next CARDIOLOGY appointment:   3 month(s)  Provider:   You may see Lorine Bears, MD or one of the following Advanced Practice Providers on your designated Care Team:   Eula Listen, [place]

## 2023-01-09 ENCOUNTER — Encounter: Payer: Medicare Other | Admitting: Physical Therapy

## 2023-01-09 ENCOUNTER — Ambulatory Visit: Payer: Medicare Other | Admitting: Dermatology

## 2023-01-09 VITALS — BP 130/74 | HR 77

## 2023-01-09 DIAGNOSIS — B353 Tinea pedis: Secondary | ICD-10-CM | POA: Diagnosis not present

## 2023-01-09 DIAGNOSIS — Z8619 Personal history of other infectious and parasitic diseases: Secondary | ICD-10-CM

## 2023-01-09 DIAGNOSIS — Z79899 Other long term (current) drug therapy: Secondary | ICD-10-CM

## 2023-01-09 DIAGNOSIS — L578 Other skin changes due to chronic exposure to nonionizing radiation: Secondary | ICD-10-CM | POA: Diagnosis not present

## 2023-01-09 DIAGNOSIS — L814 Other melanin hyperpigmentation: Secondary | ICD-10-CM

## 2023-01-09 DIAGNOSIS — Z85828 Personal history of other malignant neoplasm of skin: Secondary | ICD-10-CM | POA: Diagnosis not present

## 2023-01-09 DIAGNOSIS — D1801 Hemangioma of skin and subcutaneous tissue: Secondary | ICD-10-CM

## 2023-01-09 DIAGNOSIS — W908XXA Exposure to other nonionizing radiation, initial encounter: Secondary | ICD-10-CM | POA: Diagnosis not present

## 2023-01-09 DIAGNOSIS — Z1283 Encounter for screening for malignant neoplasm of skin: Secondary | ICD-10-CM | POA: Diagnosis not present

## 2023-01-09 DIAGNOSIS — L821 Other seborrheic keratosis: Secondary | ICD-10-CM

## 2023-01-09 DIAGNOSIS — Z7189 Other specified counseling: Secondary | ICD-10-CM

## 2023-01-09 DIAGNOSIS — B351 Tinea unguium: Secondary | ICD-10-CM

## 2023-01-09 DIAGNOSIS — Z8589 Personal history of malignant neoplasm of other organs and systems: Secondary | ICD-10-CM

## 2023-01-09 LAB — BASIC METABOLIC PANEL
BUN/Creatinine Ratio: 19 (ref 10–24)
BUN: 29 mg/dL — ABNORMAL HIGH (ref 8–27)
CO2: 27 mmol/L (ref 20–29)
Calcium: 9.5 mg/dL (ref 8.6–10.2)
Chloride: 100 mmol/L (ref 96–106)
Creatinine, Ser: 1.52 mg/dL — ABNORMAL HIGH (ref 0.76–1.27)
Glucose: 87 mg/dL (ref 70–99)
Potassium: 4.9 mmol/L (ref 3.5–5.2)
Sodium: 139 mmol/L (ref 134–144)
eGFR: 44 mL/min/{1.73_m2} — ABNORMAL LOW (ref >59)

## 2023-01-09 MED ORDER — TERBINAFINE HCL 250 MG PO TABS
250.0000 mg | ORAL_TABLET | Freq: Every day | ORAL | 0 refills | Status: DC
Start: 2023-01-09 — End: 2023-05-04

## 2023-01-09 NOTE — Progress Notes (Unsigned)
Follow-Up Visit   Subjective  Thomas [name] is a 89 y.o. male who presents for the following: Skin Cancer Screening and Upper Body Skin Exam  The patient presents for Upper-Body Skin Exam (UBSE) for skin cancer screening and mole check. The patient has spots, moles and lesions to be evaluated, some may be new or changing. He has recurrent itchy rash on the feet. He has taken terbinafine in the past, which cleared rash, but it came back. He also has dark growths on his back he would like checked. History of BCCs and SCCs.    The following portions of the chart were reviewed this encounter and updated as appropriate: medications, allergies, medical history  Review of Systems:  No other skin or systemic complaints except as noted in HPI or Assessment and Plan.  Objective  Well appearing patient in no apparent distress; mood and affect are within normal limits.  A full examination was performed including scalp, head, eyes, ears, nose, lips, neck, chest, axillae, abdomen, back, bilateral upper extremities, hands, feet, fingers, toes, fingernails, and toenails. All findings within normal limits unless otherwise noted below.   Relevant physical exam findings are noted in the Assessment and Plan.    Assessment & Plan   SKIN CANCER SCREENING PERFORMED TODAY.  ACTINIC DAMAGE - Chronic condition, secondary to cumulative UV/sun exposure - diffuse scaly erythematous macules with underlying dyspigmentation - Recommend daily broad spectrum sunscreen SPF 30+ to sun-exposed areas, reapply every 2 hours as needed.  - Staying in the shade or wearing long sleeves, sun glasses (UVA+UVB protection) and wide brim hats (4-inch brim around the entire circumference of the hat) are also recommended for sun protection.  - Call for new or changing lesions.  LENTIGINES, SEBORRHEIC KERATOSES, HEMANGIOMAS - Benign normal skin lesions - Benign-appearing - Call for any changes  MELANOCYTIC NEVI - Tan-brown  and/or pink-flesh-colored symmetric macules and papules - Benign appearing on exam today - Observation - Call clinic for new or changing moles - Recommend daily use of broad spectrum spf 30+ sunscreen to sun-exposed areas.   HISTORY OF BASAL CELL CARCINOMA OF THE SKIN Multiple, see History - No evidence of recurrence today - Recommend regular full body skin exams - Recommend daily broad spectrum sunscreen SPF 30+ to sun-exposed areas, reapply every 2 hours as needed.  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  - Call if any new or changing lesions are noted between office visits  TINEA PEDIS WITH TINEA UNGUIUM Exam: Pinkness and peeling of the soles and spaces; toenail dystrophy Chronic and persistent condition with duration or expected duration over one year. Condition is symptomatic / bothersome to patient. Not to goal. Treatment Plan: Start terbinafine 250 MG take 1 po every day with food dsp #30 0Rf. LFTs wln 06/27/2022. If patient is improving, he may send message for 2 additional refills.   Terbinafine Counseling Terbinafine is an anti-fungal medicine that can be applied to the skin (over the counter) or taken by mouth (prescription) to treat fungal infections. The pill version is often used to treat fungal infections of the nails or scalp. While most people do not have any side effects from taking terbinafine pills, some possible side  effects of the medicine can include taste changes, headache, loss of smell, vision changes, nausea, vomiting, or diarrhea.   Rare side effects can include irritation of the liver, allergic reaction, or decrease in blood counts (which may show up as not  feeling well or developing an infection). If you are concerned about any of these side effects, please stop the medicine and call your doctor, or in the case of an emergency such as feeling very unwell, seek immediate medical care.   History of Lyme disease  Patient is currently on intermittent oral metronidazole treatment.  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    Tinea pedis of both feet  Related Medications terbinafine (LAMISIL) 250 MG tablet Take 1 tablet (250 mg total) by mouth daily. Take with food.  Skin cancer screening  Actinic skin damage  History of basal cell carcinoma  History of squamous cell carcinoma  Tinea unguium  Medication management  Counseling and coordination of care   Return in about 3 months (around 04/10/2023) for Tinea, check face.  Wendee Beavers, CMA, am acting as scribe for Armida Sans, MD .   Documentation: I have reviewed the above documentation for accuracy and completeness, and I agree with the above.  Armida Sans, MD

## 2023-01-09 NOTE — Patient Instructions (Addendum)
Terbinafine Counseling  Terbinafine is an anti-fungal medicine that can be applied to the skin (over the counter) or taken by mouth (prescription) to treat fungal infections. The pill version is often used to treat fungal infections of the nails or scalp. While most people do not have any side effects from taking terbinafine pills, some possible side effects of the medicine can include taste changes, headache, loss of smell, vision changes, nausea, vomiting, or diarrhea.   Rare side effects can include irritation of the liver, allergic reaction, or decrease in blood counts (which may show up as not feeling well or developing an infection). If you are concerned about any of these side effects, please stop the medicine and call your doctor, or in the case of an emergency such as feeling very unwell, seek immediate medical care.    Due to recent changes in healthcare laws, you may see results of your pathology and/or laboratory studies on MyChart before the doctors have had a chance to review them. We understand that in some cases there may be results that are confusing or concerning to you. Please understand that not all results are received at the same time and often the doctors may need to interpret multiple results in order to provide you with the best plan of care or course of treatment. Therefore, we ask that you please give [place] 2 business days to thoroughly review all your results before contacting the office for clarification. Should we see a critical lab result, you will be contacted sooner.   If You Need Anything After Your Visit  If you have any questions or concerns for your doctor, please call our main line at [phone] and press option 4 to reach your doctor's medical assistant. If no one answers, please leave a voicemail as directed and we will return your call as soon as possible. Messages left after 4 pm will be answered the following business day.   You may also send [place] a message via  MyChart. We typically respond to MyChart messages within 1-2 business days.  For prescription refills, please ask your pharmacy to contact our office. Our fax number is [phone].  If you have an urgent issue when the clinic is closed that cannot wait until the next business day, you can page your doctor at the number below.    Please note that while we do our best to be available for urgent issues outside of office hours, we are not available 24/7.   If you have an urgent issue and are unable to reach [place], you may choose to seek medical care at your doctor's office, retail clinic, urgent care center, or emergency room.  If you have a medical emergency, please immediately call 911 or go to the emergency department.  Pager Numbers  - Dr. Gwen Pounds: [phone]  - Dr. Roseanne Reno: [phone]  - Dr. Katrinka Blazing: [phone]   In the event of inclement weather, please call our main line at [phone] for an update on the status of any delays or closures.  Dermatology Medication Tips: Please keep the boxes that topical medications come in in order to help keep track of the instructions about where and how to use these. Pharmacies typically print the medication instructions only on the boxes and not directly on the medication tubes.   If your medication is too expensive, please contact our office at [phone] option 4 or send [place] a message through MyChart.   We are unable to tell what your co-pay for medications will  be in advance as this is different depending on your insurance coverage. However, we may be able to find a substitute medication at lower cost or fill out paperwork to get insurance to cover a needed medication.   If a prior authorization is required to get your medication covered by your insurance company, please allow [place] 1-2 business days to complete this process.  Drug prices often vary depending on where the prescription is filled and some pharmacies may offer cheaper  prices.  The website www.goodrx.com contains coupons for medications through different pharmacies. The prices here do not account for what the cost may be with help from insurance (it may be cheaper with your insurance), but the website can give you the price if you did not use any insurance.  - You can print the associated coupon and take it with your prescription to the pharmacy.  - You may also stop by our office during regular business hours and pick up a GoodRx coupon card.  - If you need your prescription sent electronically to a different pharmacy, notify our office through [facility] or by phone at [phone] option 4.     Si Usted Necesita Algo Despus de Su Visita  Tambin puede enviarnos un mensaje a travs de Clinical cytogeneticist. Por lo general respondemos a los mensajes de MyChart en el transcurso de 1 a 2 das hbiles.  Para renovar recetas, por favor pida a su farmacia que se ponga en contacto con nuestra oficina. Annie Sable de fax es [place] [phone].  Si tiene un asunto urgente cuando la clnica est cerrada y que no puede esperar hasta el siguiente da hbil, puede llamar/localizar a su doctor(a) al nmero que aparece a continuacin.   Por favor, tenga en cuenta que aunque hacemos todo lo posible para estar disponibles para asuntos urgentes fuera del horario de [place], no estamos disponibles las 24 horas del da, los 7 [address]  [address] de la [place].   Si tiene un problema urgente y no puede comunicarse con nosotros, puede optar por buscar atencin mdica  en el consultorio de su doctor(a), en una clnica privada, en un centro de atencin urgente o en una sala de emergencias.  Si tiene Engineer, drilling, por favor llame inmediatamente al 911 o vaya a la sala de emergencias.  Nmeros de bper  - Dr. Gwen Pounds: [phone]  - Dra. Roseanne Reno: [phone]  - Dr. Katrinka Blazing: [phone]   En caso de inclemencias del tiempo, por favor llame a Lacy Duverney principal al [phone]  para una actualizacin sobre el [place] de cualquier retraso o cierre.  Consejos para la medicacin en dermatologa: Por favor, guarde las cajas en las que vienen los medicamentos de uso tpico para ayudarle a seguir las instrucciones sobre dnde y cmo usarlos. Las farmacias generalmente imprimen las instrucciones del medicamento slo en las cajas y no directamente en los tubos del [place].   Si su medicamento es muy caro, por favor, pngase en contacto con Rolm Gala llamando al [phone] y presione la opcin 4 o envenos un mensaje a travs de Clinical cytogeneticist.   No podemos decirle cul ser su copago por los medicamentos por adelantado ya que esto es diferente dependiendo de la cobertura de su seguro. Sin embargo, es posible que podamos encontrar un medicamento sustituto a Audiological scientist un formulario para que el seguro cubra el medicamento que se considera necesario.   Si se requiere una autorizacin previa para que su compaa de seguros [place] su medicamento, por favor permtanos de  1 a 2 das hbiles para completar [address]  Los precios de los medicamentos varan con frecuencia dependiendo del Environmental consultant de dnde se surte la receta y alguna farmacias pueden ofrecer precios ms baratos.  El sitio web www.goodrx.com tiene cupones para medicamentos de Health and safety inspector. Los precios aqu no tienen en cuenta lo que podra costar con la ayuda del seguro (puede ser ms barato con su seguro), pero el sitio web puede darle el precio si no utiliz Tourist information centre manager.  - Puede imprimir el cupn correspondiente y llevarlo con su receta a la farmacia.  - Tambin puede pasar por nuestra oficina durante el horario de atencin regular y Education officer, museum una tarjeta de cupones de GoodRx.  - Si necesita que su receta se enve electrnicamente a una farmacia diferente, informe a nuestra oficina a travs de MyChart de [place] o por telfono llamando al [phone] y presione la opcin 4.

## 2023-01-09 NOTE — Pre-Procedure Instructions (Addendum)
Attempted to call patient regarding procedure instructions.  Left voicemail on the following items: Arrival time 0730 Nothing to eat or drink after midnight No meds AM of procedure Responsible person to drive you home and stay with you for 24 hrs Wash with special soap night before and morning of procedure If on anti-coagulant drug instructions Eliquis- last dose should have been 9/5 AM dose.

## 2023-01-10 ENCOUNTER — Ambulatory Visit (HOSPITAL_COMMUNITY): Payer: Medicare Other

## 2023-01-10 ENCOUNTER — Other Ambulatory Visit: Payer: Self-pay

## 2023-01-10 ENCOUNTER — Ambulatory Visit (HOSPITAL_COMMUNITY)
Admission: RE | Admit: 2023-01-10 | Discharge: 2023-01-10 | Disposition: A | Discharge status: Home / Self Care | Payer: Medicare Other | Attending: Internal Medicine | Admitting: Internal Medicine

## 2023-01-10 ENCOUNTER — Encounter (HOSPITAL_COMMUNITY)
Admission: RE | Disposition: A | Discharge status: Home / Self Care | Payer: Medicare Other | Source: Home / Self Care | Attending: Internal Medicine

## 2023-01-10 DIAGNOSIS — I428 Other cardiomyopathies: Secondary | ICD-10-CM | POA: Diagnosis not present

## 2023-01-10 DIAGNOSIS — Z950 Presence of cardiac pacemaker: Secondary | ICD-10-CM | POA: Diagnosis not present

## 2023-01-10 DIAGNOSIS — I442 Atrioventricular block, complete: Secondary | ICD-10-CM | POA: Diagnosis not present

## 2023-01-10 DIAGNOSIS — I110 Hypertensive heart disease with heart failure: Secondary | ICD-10-CM | POA: Insufficient documentation

## 2023-01-10 DIAGNOSIS — I4819 Other persistent atrial fibrillation: Secondary | ICD-10-CM | POA: Diagnosis not present

## 2023-01-10 DIAGNOSIS — N289 Disorder of kidney and ureter, unspecified: Secondary | ICD-10-CM | POA: Insufficient documentation

## 2023-01-10 DIAGNOSIS — G459 Transient cerebral ischemic attack, unspecified: Secondary | ICD-10-CM | POA: Insufficient documentation

## 2023-01-10 DIAGNOSIS — I509 Heart failure, unspecified: Secondary | ICD-10-CM | POA: Insufficient documentation

## 2023-01-10 DIAGNOSIS — I5042 Chronic combined systolic (congestive) and diastolic (congestive) heart failure: Secondary | ICD-10-CM | POA: Diagnosis not present

## 2023-01-10 DIAGNOSIS — I2510 Atherosclerotic heart disease of native coronary artery without angina pectoris: Secondary | ICD-10-CM | POA: Diagnosis not present

## 2023-01-10 DIAGNOSIS — I472 Ventricular tachycardia, unspecified: Secondary | ICD-10-CM | POA: Insufficient documentation

## 2023-01-10 DIAGNOSIS — Z45010 Encounter for checking and testing of cardiac pacemaker pulse generator [battery]: Secondary | ICD-10-CM | POA: Diagnosis not present

## 2023-01-10 DIAGNOSIS — Z79899 Other long term (current) drug therapy: Secondary | ICD-10-CM | POA: Diagnosis not present

## 2023-01-10 DIAGNOSIS — Z7901 Long term (current) use of anticoagulants: Secondary | ICD-10-CM | POA: Diagnosis not present

## 2023-01-10 DIAGNOSIS — I495 Sick sinus syndrome: Secondary | ICD-10-CM | POA: Diagnosis not present

## 2023-01-10 HISTORY — PX: BIV UPGRADE: EP1202

## 2023-01-10 SURGERY — BIV UPGRADE
Anesthesia: LOCAL

## 2023-01-10 MED ORDER — FAMOTIDINE IN NACL 20-0.9 MG/50ML-% IV SOLN
20.0000 mg | Freq: Once | INTRAVENOUS | Status: AC
  Administered 2023-01-10: 20 mg via INTRAVENOUS

## 2023-01-10 MED ORDER — SODIUM CHLORIDE 0.9 % IV SOLN
80.0000 mg | INTRAVENOUS | Status: AC
  Administered 2023-01-10: 80 mg

## 2023-01-10 MED ORDER — SODIUM CHLORIDE 0.9 % IV SOLN: INTRAVENOUS | Status: DC

## 2023-01-10 MED ORDER — FENTANYL CITRATE (PF) 100 MCG/2ML IJ SOLN
INTRAMUSCULAR | Status: AC
  Filled 2023-01-10: qty 2

## 2023-01-10 MED ORDER — HYDROCORTISONE SOD SUC (PF) 100 MG IJ SOLR
100.0000 mg | Freq: Once | INTRAMUSCULAR | Status: AC
  Administered 2023-01-10: 100 mg via INTRAVENOUS
  Filled 2023-01-10 (×2): qty 2

## 2023-01-10 MED ORDER — ONDANSETRON HCL 4 MG/2ML IJ SOLN: 4.0000 mg | Freq: Four times a day (QID) | INTRAMUSCULAR | Status: DC | PRN

## 2023-01-10 MED ORDER — LIDOCAINE HCL (PF) 1 % IJ SOLN
INTRAMUSCULAR | Status: AC
  Filled 2023-01-10: qty 60

## 2023-01-10 MED ORDER — MIDAZOLAM HCL 2 MG/2ML IJ SOLN
INTRAMUSCULAR | Status: AC
  Filled 2023-01-10: qty 2

## 2023-01-10 MED ORDER — SODIUM CHLORIDE 0.9 % IV SOLN
INTRAVENOUS | Status: AC
  Filled 2023-01-10: qty 2

## 2023-01-10 MED ORDER — LIDOCAINE HCL (PF) 1 % IJ SOLN
INTRAMUSCULAR | Status: DC | PRN
  Administered 2023-01-10: 60 mL

## 2023-01-10 MED ORDER — FENTANYL CITRATE (PF) 100 MCG/2ML IJ SOLN
INTRAMUSCULAR | Status: DC | PRN
  Administered 2023-01-10 (×2): 25 ug via INTRAVENOUS

## 2023-01-10 MED ORDER — HEPARIN (PORCINE) IN NACL 1000-0.9 UT/500ML-% IV SOLN
INTRAVENOUS | Status: DC | PRN
  Administered 2023-01-10: 500 mL

## 2023-01-10 MED ORDER — CEFAZOLIN SODIUM-DEXTROSE 2-4 GM/100ML-% IV SOLN
INTRAVENOUS | Status: AC
  Filled 2023-01-10: qty 100

## 2023-01-10 MED ORDER — VANCOMYCIN HCL IN DEXTROSE 1-5 GM/200ML-% IV SOLN
1000.0000 mg | INTRAVENOUS | Status: AC
  Administered 2023-01-10: 1000 mg via INTRAVENOUS

## 2023-01-10 MED ORDER — VANCOMYCIN HCL IN DEXTROSE 1-5 GM/200ML-% IV SOLN
INTRAVENOUS | Status: AC
  Filled 2023-01-10: qty 200

## 2023-01-10 MED ORDER — ACETAMINOPHEN 325 MG PO TABS: 325.0000 mg | ORAL_TABLET | ORAL | Status: DC | PRN

## 2023-01-10 MED ORDER — CHLORHEXIDINE GLUCONATE 4 % EX SOLN
4.0000 | Freq: Once | CUTANEOUS | Status: DC
  Filled 2023-01-10: qty 60

## 2023-01-10 MED ORDER — FAMOTIDINE IN NACL 20-0.9 MG/50ML-% IV SOLN
INTRAVENOUS | Status: AC
  Filled 2023-01-10: qty 50

## 2023-01-10 MED ORDER — MIDAZOLAM HCL 5 MG/5ML IJ SOLN
INTRAMUSCULAR | Status: DC | PRN
  Administered 2023-01-10 (×2): 1 mg via INTRAVENOUS

## 2023-01-10 SURGICAL SUPPLY — 13 items
CABLE SURGICAL S-101-97-12 (CABLE) ×1 IMPLANT
CATH RIGHTSITE C315HIS02 (CATHETERS) IMPLANT
KIT MICROPUNCTURE NIT STIFF (SHEATH) IMPLANT
KIT WRENCH (KITS) IMPLANT
LEAD SELECT SECURE 3830 383069 (Lead) IMPLANT
PACEMAKER PRCT MRI CRTP W1TR01 (Pacemaker) IMPLANT
PAD DEFIB RADIO PHYSIO CONN (PAD) ×1 IMPLANT
PPM PRECEPTA MRI CRT-P W1TR01 (Pacemaker) ×1 IMPLANT
SELECT SECURE 3830 383069 (Lead) ×1 IMPLANT
SHEATH 7FR PRELUDE SNAP 13 (SHEATH) IMPLANT
SLITTER 6232ADJ (MISCELLANEOUS) IMPLANT
TRAY PACEMAKER INSERTION (PACKS) ×1 IMPLANT
WIRE HI TORQ VERSACORE-J 145CM (WIRE) IMPLANT

## 2023-01-10 NOTE — H&P (Signed)
Patient Care Team: Bosie Clos, MD as PCP - General (Family Medicine) Iran Ouch, MD as PCP - Cardiology (Cardiology) Duke Salvia, MD as PCP - Electrophysiology (Cardiology) Duke Salvia, MD as Consulting Physician (Cardiology) Lockie Mola, MD as Referring Physician (Ophthalmology) [place] Ouch, MD as Consulting Physician (Cardiology) Riki Altes, MD (Urology) Kandyce Rud., MD (Rheumatology) Deirdre Evener, MD (Dermatology)   HPI  Thomas [name] is a 89 y.o. male admitted for CRT upgrade for LV cardiomyopathy in setting of RV apical pacing for CHB Hx of PAf on dofetide and on Apixaban    Stable shortness of breath and fatigue No chest pain  Last Apixaban wed  DATE TEST EF    2/18 Echo   55-60 % Reportedly interval normlization  6/19 LHC   3 V CAD moderate>>medical Rx   10/20 Echo  45.-50% LA size is better  11/21 Echo 45-50%    10/22 LHC 55-60% 3V CAD- moderate  >> med Rx  6/24 Echo  30-35%    7/24 LHC   3V CAD modest>>Med Rx Normal filling pressures      Date Cr K Mg Hgb  6/18 1.46     14.4  5/19 1.47 4.3 2.0 16  10/19 1.55 4.5   16.8  1/20 1.47 4.6   16.5  9/20 1.64 4.7 2.2 16.0  10/21 1.45 4.4   15.6  6/22 1.39 4.1   13.6  10/22 1.43 4.8 2.3 14.9  9/23 1.39 4.4   14.5  9/24 1.52 4.9  15.4    Records and Results Reviewed   Past Medical History:  Diagnosis Date   Aortic valve disorders    Arthritis    Atrial flutter (HCC) 06/18/2016   "AF or AFl; not sure which" (06/23/2016)   Basal cell carcinoma    "face, nose left shoulder, left arm" (06/19/2016)   Basal cell carcinoma 09/13/2020   right temple   BBB (bundle branch block)    hx right   Chronic back pain    "neck, thoracic, lower back" (06/19/2016)   Complete heart block (HCC) 06/2016   Dyspnea    GERD (gastroesophageal reflux disease)    Gout    Heart block    "I've had type I, II Wenke before now" (06/19/2016)   History of gout     History of hiatal hernia    "self dx'd" (06/19/2016)   Hyperlipidemia    Hypertension    Lyme disease    "dx'd by me 2003; cx's showed dx 08/2015"   Migraine    "3-4/year" (06/19/2016)   Presence of permanent cardiac pacemaker 06/19/2016   PVC's (premature ventricular contractions)    Renal cancer, left (HCC) 2006   S/P cryotherapy   Spinal stenosis    "cervical, 1 thoracic, lumbar" (06/19/2016)   Squamous carcinoma    "face, nose left shoulder, left arm" (06/19/2016)   Stroke (HCC)    TIA (transient ischemic attack) 06/14/2016   "I'm not sure that's what it was" (06/25/2016)   Visit for monitoring Tikosyn therapy 09/09/2017    Past Surgical History:  Procedure Laterality Date   ANKLE FRACTURE SURGERY Right 1967   BACK SURGERY  05/07/2020   BASAL CELL CARCINOMA EXCISION     "face, nose left shoulder, left arm"   BIOPSY PROSTATE  2001 & 2003   CARDIAC CATHETERIZATION  1990's   CARDIOVERSION N/A 09/11/2017   Procedure: CARDIOVERSION;  Surgeon: Lewayne Bunting,  MD;  Location: [facility];  Service: Cardiovascular;  Laterality: N/A;   FRACTURE SURGERY     HOLEP-LASER ENUCLEATION OF THE PROSTATE WITH MORCELLATION N/A 07/10/2020   Procedure: HOLEP-LASER ENUCLEATION OF THE PROSTATE WITH MORCELLATION;  Surgeon: Vanna Scotland, MD;  Location: [facility] ORS;  Service: Urology;  Laterality: N/A;   INGUINAL HERNIA REPAIR Left 2012   INSERT / REPLACE / REMOVE PACEMAKER  06/19/2016   LAPAROSCOPIC ABLATION RENAL MASS     LEFT HEART CATH AND CORONARY ANGIOGRAPHY Left 10/23/2017   Procedure: LEFT HEART CATH AND CORONARY ANGIOGRAPHY;  Surgeon: Iran Ouch, MD;  Location: [facility] INVASIVE CV LAB;  Service: Cardiovascular;  Laterality: Left;   LEFT HEART CATH AND CORONARY ANGIOGRAPHY Left 02/26/2021   Procedure: LEFT HEART CATH AND CORONARY ANGIOGRAPHY;  Surgeon: Iran Ouch, MD;  Location: [facility] INVASIVE CV LAB;  Service: Cardiovascular;  Laterality: Left;   PACEMAKER IMPLANT N/A 06/19/2016    Procedure: Pacemaker Implant;  Surgeon: Duke Salvia, MD;  [facility] INVASIVE CV LAB;  Service: Cardiovascular;  Laterality: N/A;   pacemasker     PROSTATE SURGERY     RIGHT/LEFT HEART CATH AND CORONARY ANGIOGRAPHY Bilateral 11/25/2022   Procedure: RIGHT/LEFT HEART CATH AND CORONARY ANGIOGRAPHY;  Surgeon: Iran Ouch, MD;  Location: [facility] INVASIVE CV LAB;  Service: Cardiovascular;  Laterality: Bilateral;   SQUAMOUS CELL CARCINOMA EXCISION     "face, nose left shoulder, left arm"   TONSILLECTOMY AND ADENOIDECTOMY      Current Facility-Administered Medications  Medication Dose Route Frequency Provider Last Rate Last Admin   0.9 %  sodium chloride infusion   Intravenous Continuous Duke Salvia, MD 50 mL/hr at 01/10/23 0850 New Bag at 01/10/23 0850   0.9 %  sodium chloride infusion   Intravenous Continuous Duke Salvia, MD 50 mL/hr at 01/10/23 0849 New Bag at 01/10/23 0849   chlorhexidine (HIBICLENS) 4 % liquid 4 Application  4 Application Topical Once Duke Salvia, MD       famotidine (PEPCID) IVPB 20 mg premix  20 mg Intravenous Once Duke Salvia, MD       gentamicin (GARAMYCIN) 80 mg in sodium chloride 0.9 % 500 mL irrigation  80 mg Irrigation On Call Duke Salvia, MD       hydrocortisone sodium succinate (SOLU-CORTEF) 100 MG injection 100 mg  100 mg Intravenous Once Duke Salvia, MD       vancomycin (VANCOCIN) IVPB 1000 mg/200 mL premix  1,000 mg Intravenous On Call Duke Salvia, MD        Allergies  Allergen Reactions   Other     Vicryl sutures - patient states that site becomes "soupy"    Iodine Hives and Other (See Comments)    IVP contrast   Penicillins Itching, Rash and Other (See Comments)    ITCHY FEELING IN FINGERS Has patient had a PCN reaction causing immediate rash, facial/tongue/throat swelling, SOB or lightheadedness with hypotension: No Has patient had a PCN reaction causing severe rash involving mucus membranes or skin necrosis: No Has  patient had a PCN reaction that required hospitalization No Has patient had a PCN reaction occurring within the last 10 years: No If all of the above answers are "NO", then may proceed with Cephalosporin use.       Social History   Tobacco Use   Smoking status: Never   Smokeless tobacco: Never  Vaping Use   Vaping status: Never Used  Substance Use Topics  Alcohol use: Yes    Alcohol/week: 1.0 standard drink of alcohol    Types: 1 Glasses of wine per week   Drug use: No     Family History  Problem Relation Age of Onset   Heart attack Brother    Stroke Brother    Aortic stenosis Mother    Arthritis Father      Current Meds  Medication Sig   apixaban (ELIQUIS) 2.5 MG TABS tablet Take 1 tablet (2.5 mg total) by mouth 2 (two) times daily.   buPROPion (WELLBUTRIN XL) 150 MG 24 hr tablet Take 150 mg by mouth daily.   calcium carbonate (TUMS - DOSED IN MG ELEMENTAL CALCIUM) 500 MG chewable tablet Chew 1-2 tablets by mouth daily as needed for indigestion.   Coenzyme Q10 (COQ10) 100 MG CAPS Take 100 mg by mouth daily.   colchicine 0.6 MG tablet 0.6 mg daily as needed (Gout).   dofetilide (TIKOSYN) 125 MCG capsule Take 1 capsule (125 mcg total) by mouth 2 (two) times daily.   DULoxetine (CYMBALTA) 20 MG capsule Take 20 mg by mouth daily.   febuxostat (ULORIC) 40 MG tablet Take 1 tablet (40 mg total) by mouth daily.   gabapentin (NEURONTIN) 300 MG capsule Take 300 mg by mouth at bedtime.   metoprolol succinate (TOPROL-XL) 50 MG 24 hr tablet TAKE 1 TABLET BY MOUTH DAILY WITH OR IMMEDIATELY FOLLOWING A MEAL   metroNIDAZOLE (FLAGYL) 500 MG tablet TAKE 1 TABLET BY MOUTH TWICE DAILY ON SUNDAY AND MONDAY.   omeprazole (PRILOSEC) 20 MG capsule TAKE ONE CAPSULE BY MOUTH EVERY DAY FOR HEARTBURN TAKE 30 MINUTES BEFORE A MEAL   sacubitril-valsartan (ENTRESTO) 24-26 MG Take 1 tablet by mouth 2 (two) times daily.   terbinafine (LAMISIL) 250 MG tablet Take 1 tablet (250 mg total) by mouth daily.  Take with food.   Testosterone 1.62 % GEL Apply 2 Pump topically daily. APPLY 2 PUMPS DAILY to Intact skin daily; avoiding scrotum and face   testosterone cypionate (DEPOTESTOSTERONE CYPIONATE) 200 MG/ML injection Inject 1 mL (200 mg total) into the muscle every 28 (twenty-eight) days.     Review of Systems negative except from HPI and PMH  Physical Exam BP 117/77   Pulse 82   Temp 97.8 F (36.6 C) (Oral)   Ht 5\' 5"  (1.651 m)   Wt 70.3 kg   SpO2 95%   BMI 25.79 kg/m  Well developed and well nourished in no acute distress HENT normal E scleral and icterus clear Neck Supple JVP flat; carotids brisk and full Clear to ausculation  Regular rate and rhythm, no murmurs gallops or rub Soft with active bowel sounds No clubbing cyanosis  Edema Alert and oriented, grossly normal motor and sensory function Skin Warm and Dry    Assessment and  Plan Atrial fibrillation-persistent   TIA   Coronary artery disease- mod medical therapy   Sinus node dysfunction/chronotropic incompetence   Congestive heart failure-chronic-class III   Complete heart block     Hypertension   High Risk Medication Surveillance-Dofetilide   Pacemaker Medtronic    Renal insufficiency grade 4  Estimated Creatinine Clearance: 27.9 mL/min (A) (by C-G formula based on SCr of 1.59 mg/dL (H)).   Cardiomyopathy-progressive   VT nonsustained    Admitted for CRT upgrade for progressive LV dysfunction in setting of complete heart block and RV apical pacing  The benefits and risks were reviewed including but not limited to death,  perforation, infection, lead dislodgement and device malfunction.  The  patient understands agrees and is willing to proceed.

## 2023-01-10 NOTE — Discharge Instructions (Addendum)
After Your Pacemaker   You have a Medtronic Pacemaker  ACTIVITY Do not lift your arm above shoulder height for 1 week after your procedure. After 7 days, you may progress as below.  You should remove your sling 24 hours after your procedure, unless otherwise instructed by your provider.     Friday January 17, 2023  Saturday January 18, 2023 Sunday January 19, 2023 Monday January 20, 2023   Do not lift, push, pull, or carry anything over 10 pounds with the affected arm until 6 weeks (Friday February 21, 2023 ) after your procedure.   You may drive AFTER your wound check, unless you have been told otherwise by your provider.   Ask your healthcare provider when you can go back to work   INCISION/Dressing If you are on a blood thinner such as Coumadin, Xarelto, Eliquis, Plavix, or Pradaxa please confirm with your provider when this should be resumed. Resume Eliquis Sunday 01/12/23  If large square, outer bandage is left in place, this can be removed after 24 hours from your procedure. Do not remove steri-strips or glue as below.   If a PRESSURE DRESSING (a bulky dressing that usually goes up over your shoulder) was applied or left in place, please follow instructions given by your provider on when to return to have this removed.   Monitor your Pacemaker site for redness, swelling, and drainage. Call the device clinic at [phone] if you experience these symptoms or fever/chills.  If your incision is sealed with Steri-strips or staples, you may shower 7 days after your procedure or when told by your provider. Do not remove the steri-strips or let the shower hit directly on your site. You may wash around your site with soap and water.    If you were discharged in a sling, please do not wear this during the day more than 48 hours after your surgery unless otherwise instructed. This may increase the risk of stiffness and soreness in your shoulder.   Avoid lotions, ointments, or  perfumes over your incision until it is well-healed.  You may use a hot tub or a pool AFTER your wound check appointment if the incision is completely closed.  Pacemaker Alerts:  Some alerts are vibratory and others beep. These are NOT emergencies. Please call our office to let us know. If this occurs at night or on weekends, it can wait until the next business day. Send a remote transmission.  If your device is capable of reading fluid status (for heart failure), you will be offered monthly monitoring to review this with you.   DEVICE MANAGEMENT Remote monitoring is used to monitor your pacemaker from home. This monitoring is scheduled every 91 days by our office. It allows [place] to keep an eye on the functioning of your device to ensure it is working properly. You will routinely see your Electrophysiologist annually (more often if necessary).   You should receive your ID card for your new device in 4-8 weeks. Keep this card with you at all times once received. Consider wearing a medical alert bracelet or necklace.  Your Pacemaker may be MRI compatible. This will be discussed at your next office visit/wound check.  You should avoid contact with strong electric or magnetic fields.   Do not use amateur (ham) radio equipment or electric (arc) welding torches. MP3 player headphones with magnets should not be used. Some devices are safe to use if held at least 12 inches (30 cm) from your Pacemaker. These include  power tools, [employer], and speakers. If you are unsure if something is safe to use, ask your health care provider.  When using your cell phone, hold it to the ear that is on the opposite side from the Pacemaker. Do not leave your cell phone in a pocket over the Pacemaker.  You may safely use electric blankets, heating pads, computers, and microwave ovens.  Call the office right away if: You have chest pain. You feel more short of breath than you have felt before. You feel more  light-headed than you have felt before. Your incision starts to open up.  This information is not intended to replace advice given to you by your health care provider. Make sure you discuss any questions you have with your health care provider.

## 2023-01-11 ENCOUNTER — Encounter: Payer: Self-pay | Admitting: Dermatology

## 2023-01-13 ENCOUNTER — Encounter (HOSPITAL_COMMUNITY): Payer: Self-pay | Admitting: Internal Medicine

## 2023-01-14 ENCOUNTER — Encounter: Payer: Self-pay | Admitting: Dermatology

## 2023-01-14 ENCOUNTER — Ambulatory Visit: Payer: Medicare Other | Admitting: Physical Therapy

## 2023-01-16 ENCOUNTER — Encounter: Payer: Medicare Other | Admitting: Physical Therapy

## 2023-01-21 ENCOUNTER — Ambulatory Visit: Payer: Medicare Other | Admitting: Physical Therapy

## 2023-01-23 ENCOUNTER — Ambulatory Visit: Payer: Medicare Other

## 2023-01-23 ENCOUNTER — Ambulatory Visit: Payer: Medicare Other | Attending: Cardiovascular Disease

## 2023-01-23 ENCOUNTER — Encounter: Payer: Medicare Other | Admitting: Physical Therapy

## 2023-01-23 ENCOUNTER — Telehealth: Payer: Self-pay

## 2023-01-23 DIAGNOSIS — I442 Atrioventricular block, complete: Secondary | ICD-10-CM | POA: Diagnosis not present

## 2023-01-23 DIAGNOSIS — I5022 Chronic systolic (congestive) heart failure: Secondary | ICD-10-CM

## 2023-01-23 DIAGNOSIS — I480 Paroxysmal atrial fibrillation: Secondary | ICD-10-CM

## 2023-01-23 DIAGNOSIS — R0683 Snoring: Secondary | ICD-10-CM

## 2023-01-23 LAB — CUP PACEART INCLINIC DEVICE CHECK
Date Time Interrogation Session: 20240919194055
Implantable Lead Connection Status: 753985
Implantable Lead Connection Status: 753985
Implantable Lead Connection Status: 753985
Implantable Lead Implant Date: 20180214
Implantable Lead Implant Date: 20180214
Implantable Lead Implant Date: 20240906
Implantable Lead Location: 753859
Implantable Lead Location: 753860
Implantable Lead Location: 753860
Implantable Lead Model: 3830
Implantable Lead Model: 5076
Implantable Lead Model: 5076
Implantable Pulse Generator Implant Date: 20240906

## 2023-01-23 NOTE — Telephone Encounter (Signed)
Patient and wife in office today for 14 day post-op visit following his pacemaker upgrade procedure on 01/10/23.   While in office, wife/patient request an update on the scheduling of  his sleep study. Also, patient is interested in starting cardiac rehab.  I can see that Dr. Kirke Corin and Faustino Congress, RN had been discussing both of these procedures in July.  Likely plans were put on hold until after the device upgrade.     I did bring to wife's attention the last patient message sent by Misty Stanley asking for information.  Wife states she was confused by that message and thought she was just to wait for someone to call her with the sleep study appointment.   She requests that I forward a message to Misty Stanley asking for a call and update on getting Dr. Leonette Monarch scheduled for sleep study and cardiac rehab.

## 2023-01-23 NOTE — Progress Notes (Signed)
Wound check appointment. Dermabond removed. Wound without redness or edema. Incision edges approximated, wound well healed. Normal device function. Thresholds, sensing, and impedances consistent with implant measurements. Device programmed at 3.5V/auto capture programmed on for extra safety margin until 3 month visit. Histogram distribution appropriate for patient and level of activity. No mode switches or high ventricular rates noted. Patient educated about wound care, arm mobility, lifting restrictions. ROV in 3 months with implanting physician.

## 2023-01-23 NOTE — Patient Instructions (Addendum)
After Your Pacemaker   Monitor your pacemaker site for redness, swelling, and drainage. Call the device clinic at [phone] if you experience these symptoms or fever/chills.  Your incision was closed with Dermabond:  You may shower 1 day after your defibrillator implant and wash your incision with soap and water. Avoid lotions, ointments, or perfumes over your incision until it is well-healed.  You may use a hot tub or a pool after your wound check appointment if the incision is completely closed.  Do not lift, push or pull greater than 10 pounds with the affected arm until 6 weeks after your procedure. UNTIL AFTER OCTOBER 18TH.  There are no other restrictions in arm movement after your wound check appointment.  You may drive, unless driving has been restricted by your healthcare providers.  Remote monitoring is used to monitor your pacemaker from home. This monitoring is scheduled every 91 days by our office. It allows [place] to keep an eye on the functioning of your device to ensure it is working properly. You will routinely see your Electrophysiologist annually (more often if necessary).

## 2023-01-24 NOTE — Telephone Encounter (Signed)
Left a message for the patient to call back.

## 2023-01-28 ENCOUNTER — Ambulatory Visit: Payer: Medicare Other | Attending: Urology | Admitting: Physical Therapy

## 2023-01-28 ENCOUNTER — Ambulatory Visit: Payer: Medicare Other | Admitting: Physical Therapy

## 2023-01-28 DIAGNOSIS — G8929 Other chronic pain: Secondary | ICD-10-CM | POA: Diagnosis not present

## 2023-01-28 DIAGNOSIS — M545 Low back pain, unspecified: Secondary | ICD-10-CM | POA: Insufficient documentation

## 2023-01-28 DIAGNOSIS — M4125 Other idiopathic scoliosis, thoracolumbar region: Secondary | ICD-10-CM | POA: Insufficient documentation

## 2023-01-28 DIAGNOSIS — M533 Sacrococcygeal disorders, not elsewhere classified: Secondary | ICD-10-CM | POA: Diagnosis not present

## 2023-01-28 DIAGNOSIS — R2689 Other abnormalities of gait and mobility: Secondary | ICD-10-CM | POA: Diagnosis not present

## 2023-01-28 DIAGNOSIS — R293 Abnormal posture: Secondary | ICD-10-CM | POA: Diagnosis not present

## 2023-01-28 DIAGNOSIS — N39498 Other specified urinary incontinence: Secondary | ICD-10-CM | POA: Diagnosis present

## 2023-01-28 NOTE — Patient Instructions (Addendum)
L arm slides by wall 10 reps    R arm push up on counter, R rib to R elbow 10 reps    Stepping back wards 2 min   Seated rotation  In hale, tall, exhale twist  15 reps
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
## 2023-01-28 NOTE — Telephone Encounter (Signed)
Pt returning call

## 2023-01-29 NOTE — Telephone Encounter (Addendum)
Left a message for the patient to call back.  The patient's sleep study has not been scheduled as of yet. A second order has been placed and message sent to the sleep pool for scheduling.   The patient will need to answer the following.  How likely are you to doze off or fall asleep in the follow situations, in contrast to feeling just tired? Use the following scale to choose the most appropriate number for each situation.    Addendum:   Spoke with the patient. Answers are below.  In your response back- please type the number (associated with the situations below) and the number of how likely you are to doze beside.  0= would never doze 1= slight chance in dozing 2= moderate chance of dozing 3= high chance of dozing  Situations: 1.)Sitting and watching TV __1_ 2.)Watching TV __1_ 3.)Sitting, inactive in a public place (meetings, theater)  __1_ 4.)As a passenger in a car for an hour without a break _2__ 5.)Lying down to rest in the afternoon when circumstances permit __3_ 6.)Sitting and talking with someone _0__ 7.)Sitting quietly after a lunch without alcohol __1_ 8.)In a car, while stopped for a few minutes in traffic _1__  Total: 10

## 2023-01-30 ENCOUNTER — Encounter: Payer: Medicare Other | Admitting: Physical Therapy

## 2023-01-31 NOTE — Telephone Encounter (Signed)
Left a message for the patient to call back.  

## 2023-02-04 ENCOUNTER — Ambulatory Visit: Payer: Medicare Other | Admitting: Physical Therapy

## 2023-02-06 ENCOUNTER — Encounter: Payer: Medicare Other | Admitting: Physical Therapy

## 2023-02-11 ENCOUNTER — Ambulatory Visit: Payer: Medicare Other | Admitting: Physical Therapy

## 2023-02-11 ENCOUNTER — Ambulatory Visit: Payer: Medicare Other | Attending: Urology | Admitting: Physical Therapy

## 2023-02-11 DIAGNOSIS — R2689 Other abnormalities of gait and mobility: Secondary | ICD-10-CM | POA: Insufficient documentation

## 2023-02-11 DIAGNOSIS — R293 Abnormal posture: Secondary | ICD-10-CM | POA: Insufficient documentation

## 2023-02-11 DIAGNOSIS — G8929 Other chronic pain: Secondary | ICD-10-CM | POA: Diagnosis not present

## 2023-02-11 DIAGNOSIS — M4125 Other idiopathic scoliosis, thoracolumbar region: Secondary | ICD-10-CM | POA: Diagnosis not present

## 2023-02-11 DIAGNOSIS — M545 Low back pain, unspecified: Secondary | ICD-10-CM | POA: Diagnosis not present

## 2023-02-11 DIAGNOSIS — N39498 Other specified urinary incontinence: Secondary | ICD-10-CM | POA: Insufficient documentation

## 2023-02-11 DIAGNOSIS — M533 Sacrococcygeal disorders, not elsewhere classified: Secondary | ICD-10-CM | POA: Diagnosis not present
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
## 2023-02-12 ENCOUNTER — Ambulatory Visit: Payer: Medicare Other | Admitting: Cardiology

## 2023-02-12 DIAGNOSIS — Z981 Arthrodesis status: Secondary | ICD-10-CM | POA: Diagnosis not present

## 2023-02-13 ENCOUNTER — Encounter: Payer: Medicare Other | Admitting: Physical Therapy

## 2023-02-25 ENCOUNTER — Ambulatory Visit: Payer: Medicare Other | Admitting: Physical Therapy

## 2023-02-25 DIAGNOSIS — G8929 Other chronic pain: Secondary | ICD-10-CM

## 2023-02-25 DIAGNOSIS — M4125 Other idiopathic scoliosis, thoracolumbar region: Secondary | ICD-10-CM

## 2023-02-25 DIAGNOSIS — N39498 Other specified urinary incontinence: Secondary | ICD-10-CM

## 2023-02-25 DIAGNOSIS — R293 Abnormal posture: Secondary | ICD-10-CM | POA: Diagnosis not present

## 2023-02-25 DIAGNOSIS — R2689 Other abnormalities of gait and mobility: Secondary | ICD-10-CM

## 2023-02-25 DIAGNOSIS — M533 Sacrococcygeal disorders, not elsewhere classified: Secondary | ICD-10-CM

## 2023-02-25 DIAGNOSIS — M545 Low back pain, unspecified: Secondary | ICD-10-CM | POA: Diagnosis not present

## 2023-02-25 NOTE — Patient Instructions (Addendum)
Scoliosis : 1)  : L hand on wall   Modified side plank at the wall  Leaning hips , elbow by ribs , shoulder down 20  reps           2)   Tip the teapot    Wall lean for scoliosis    Forearm slide against wall as you stand perpendicular to wall,feet hip width apart,  opposite hand pulls band without letting elbow move away from side body  And slide forearm against the door up and down  Tilt trunk away from door but not away from hip    Make sure the upper trapezius muscle does not hike up to ear as band is being pulled as you lean towards wall      Green band  20  Leaning _ only to open the L _ flank area
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
## 2023-03-08 ENCOUNTER — Other Ambulatory Visit: Payer: Self-pay | Admitting: Urology

## 2023-03-08 DIAGNOSIS — E291 Testicular hypofunction: Secondary | ICD-10-CM

## 2023-03-18 ENCOUNTER — Ambulatory Visit: Payer: Medicare Other | Attending: Urology | Admitting: Physical Therapy

## 2023-03-18 DIAGNOSIS — N39498 Other specified urinary incontinence: Secondary | ICD-10-CM | POA: Insufficient documentation

## 2023-03-18 DIAGNOSIS — G8929 Other chronic pain: Secondary | ICD-10-CM | POA: Diagnosis not present

## 2023-03-18 DIAGNOSIS — M4125 Other idiopathic scoliosis, thoracolumbar region: Secondary | ICD-10-CM | POA: Diagnosis not present

## 2023-03-18 DIAGNOSIS — R2689 Other abnormalities of gait and mobility: Secondary | ICD-10-CM | POA: Diagnosis not present

## 2023-03-18 DIAGNOSIS — M545 Low back pain, unspecified: Secondary | ICD-10-CM | POA: Insufficient documentation

## 2023-03-18 DIAGNOSIS — M533 Sacrococcygeal disorders, not elsewhere classified: Secondary | ICD-10-CM | POA: Insufficient documentation

## 2023-03-18 DIAGNOSIS — R293 Abnormal posture: Secondary | ICD-10-CM | POA: Insufficient documentation

## 2023-03-18 NOTE — Patient Instructions (Signed)
   Clam Shell 45 Degrees  Lying with hips and knees bent 45, one pillow between knees and ankles. Heel together, toes apart like ballerina,  Lift knee with exhale while pressing heels together. Be sure pelvis does not roll backward. Do not arch back. Do 20 times, each leg, 2 times per day.    Complimentary stretch:      Sit at 45 deg turn with R leg and knee on edge of chair/ bench, L buttock hanging off the edge to bring the L foot back like a lunge, toes bent, lower heel to feel quad stretch,  pay attention to keeping pinky and first toe ballmound planted to align toes forward so ankles are not twerked   Repeat with other side   ___
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
## 2023-03-28 ENCOUNTER — Other Ambulatory Visit: Payer: Self-pay | Admitting: Dermatology

## 2023-03-28 DIAGNOSIS — B353 Tinea pedis: Secondary | ICD-10-CM

## 2023-04-09 ENCOUNTER — Other Ambulatory Visit: Payer: Self-pay | Admitting: Internal Medicine

## 2023-04-09 DIAGNOSIS — Z8619 Personal history of other infectious and parasitic diseases: Secondary | ICD-10-CM | POA: Diagnosis not present

## 2023-04-09 DIAGNOSIS — S22001D Stable burst fracture of unspecified thoracic vertebra, subsequent encounter for fracture with routine healing: Secondary | ICD-10-CM | POA: Diagnosis not present

## 2023-04-09 DIAGNOSIS — Z950 Presence of cardiac pacemaker: Secondary | ICD-10-CM | POA: Diagnosis not present

## 2023-04-09 DIAGNOSIS — Z85528 Personal history of other malignant neoplasm of kidney: Secondary | ICD-10-CM | POA: Diagnosis not present

## 2023-04-09 DIAGNOSIS — R051 Acute cough: Secondary | ICD-10-CM | POA: Diagnosis not present

## 2023-04-09 DIAGNOSIS — I480 Paroxysmal atrial fibrillation: Secondary | ICD-10-CM | POA: Diagnosis not present

## 2023-04-11 ENCOUNTER — Encounter: Payer: Self-pay | Admitting: Cardiovascular Disease

## 2023-04-11 ENCOUNTER — Ambulatory Visit: Payer: Medicare Other | Attending: Cardiovascular Disease | Admitting: Cardiovascular Disease

## 2023-04-11 VITALS — BP 98/58 | HR 61 | Ht 65.0 in | Wt 158.4 lb

## 2023-04-11 DIAGNOSIS — E785 Hyperlipidemia, unspecified: Secondary | ICD-10-CM

## 2023-04-11 DIAGNOSIS — I10 Essential (primary) hypertension: Secondary | ICD-10-CM

## 2023-04-11 DIAGNOSIS — I442 Atrioventricular block, complete: Secondary | ICD-10-CM

## 2023-04-11 DIAGNOSIS — I25118 Atherosclerotic heart disease of native coronary artery with other forms of angina pectoris: Secondary | ICD-10-CM | POA: Diagnosis not present

## 2023-04-11 DIAGNOSIS — I5022 Chronic systolic (congestive) heart failure: Secondary | ICD-10-CM

## 2023-04-11 DIAGNOSIS — I4819 Other persistent atrial fibrillation: Secondary | ICD-10-CM
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
## 2023-04-11 NOTE — Patient Instructions (Signed)
Medication Instructions:  No changes *If you need a refill on your cardiac medications before your next appointment, please call your pharmacy*   Lab Work: None ordered If you have labs (blood work) drawn today and your tests are completely normal, you will receive your results only by: MyChart Message (if you have MyChart) OR A paper copy in the mail If you have any lab test that is abnormal or we need to change your treatment, we will call you to review the results.   Testing/Procedures: None ordered   Follow-Up: At [facility], you and your health needs are our priority.  As part of our continuing mission to provide you with exceptional heart care, we have created designated Provider Care Teams.  These Care Teams include your primary Cardiologist (physician) and Advanced Practice Providers (APPs -  Physician Assistants and Nurse Practitioners) who all work together to provide you with the care you need, when you need it.  We recommend signing up for the patient portal called "MyChart".  Sign up information is provided on this After Visit Summary.  MyChart is used to connect with patients for Virtual Visits (Telemedicine).  Patients are able to view lab/test results, encounter notes, upcoming appointments, etc.  Non-urgent messages can be sent to your provider as well.   To learn more about what you can do with MyChart, go to [email or web address].    Your next appointment:   6 month(s)  Provider:   You may see Lorine Bears, MD or one of the following Advanced Practice Providers on your designated Care Team:   Nicolasa Ducking, NP Eula Listen, PA-C Cadence Fransico Michael, PA-C Charlsie Quest, NP Carlos Levering, NP    Other Instructions A referral has been placed to Cardiac Rehab

## 2023-04-14 DIAGNOSIS — M1811 Unilateral primary osteoarthritis of first carpometacarpal joint, right hand: Secondary | ICD-10-CM | POA: Diagnosis not present

## 2023-04-14 DIAGNOSIS — M180 Bilateral primary osteoarthritis of first carpometacarpal joints: Secondary | ICD-10-CM | POA: Diagnosis not present

## 2023-04-14 DIAGNOSIS — M1812 Unilateral primary osteoarthritis of first carpometacarpal joint, left hand: Secondary | ICD-10-CM | POA: Diagnosis not present

## 2023-04-16 ENCOUNTER — Ambulatory Visit: Payer: Medicare Other | Attending: Internal Medicine | Admitting: Internal Medicine

## 2023-04-16 ENCOUNTER — Encounter: Payer: Self-pay | Admitting: Internal Medicine

## 2023-04-16 VITALS — BP 128/74 | HR 67 | Ht 65.0 in | Wt 154.2 lb

## 2023-04-16 DIAGNOSIS — Z79899 Other long term (current) drug therapy: Secondary | ICD-10-CM

## 2023-04-16 DIAGNOSIS — I4729 Other ventricular tachycardia: Secondary | ICD-10-CM

## 2023-04-16 DIAGNOSIS — I4819 Other persistent atrial fibrillation: Secondary | ICD-10-CM

## 2023-04-16 DIAGNOSIS — Z950 Presence of cardiac pacemaker: Secondary | ICD-10-CM | POA: Diagnosis not present

## 2023-04-16 DIAGNOSIS — I495 Sick sinus syndrome: Secondary | ICD-10-CM

## 2023-04-16 DIAGNOSIS — I5022 Chronic systolic (congestive) heart failure: Secondary | ICD-10-CM

## 2023-04-16 DIAGNOSIS — I442 Atrioventricular block, complete: Secondary | ICD-10-CM

## 2023-04-16 MED ORDER — ATORVASTATIN CALCIUM 40 MG PO TABS: 40.0000 mg | ORAL_TABLET | Freq: Every day | ORAL | 3 refills | Status: DC

## 2023-04-16 MED ORDER — METOPROLOL SUCCINATE ER 50 MG PO TB24: ORAL_TABLET | ORAL | 3 refills | Status: DC

## 2023-04-16 MED ORDER — SACUBITRIL-VALSARTAN 24-26 MG PO TABS: 1.0000 | ORAL_TABLET | Freq: Two times a day (BID) | ORAL | 3 refills | Status: AC

## 2023-04-16 MED ORDER — FUROSEMIDE 40 MG PO TABS: ORAL_TABLET | ORAL | 0 refills | Status: DC

## 2023-04-16 NOTE — Patient Instructions (Addendum)
Medication Instructions:  Your physician has recommended you make the following change in your medication:   ** Begin Furosemide 40mg  - 1 tablet by mouth every other day x 3 doses then stop.  *If you need a refill on your cardiac medications before your next appointment, please call your pharmacy*   Lab Work: BMET and Mg level today If you have labs (blood work) drawn today and your tests are completely normal, you will receive your results only by: MyChart Message (if you have MyChart) OR A paper copy in the mail If you have any lab test that is abnormal or we need to change your treatment, we will call you to review the results.   Testing/Procedures:  Echo in 3 months Your physician has requested that you have an echocardiogram. Echocardiography is a painless test that uses sound waves to create images of your heart. It provides your doctor with information about the size and shape of your heart and how well your heart's chambers and valves are working. This procedure takes approximately one hour. There are no restrictions for this procedure. Please do NOT wear cologne, perfume, aftershave, or lotions (deodorant is allowed). Please arrive 15 minutes prior to your appointment time.  Please note: We ask at that you not bring children with you during ultrasound (echo/ vascular) testing. Due to room size and safety concerns, children are not allowed in the ultrasound rooms during exams. Our front office staff cannot provide observation of children in our lobby area while testing is being conducted. An adult accompanying a patient to their appointment will only be allowed in the ultrasound room at the discretion of the ultrasound technician under special circumstances. We apologize for any inconvenience.    Follow-Up: At [facility], you and your health needs are our priority.  As part of our continuing mission to provide you with exceptional heart care, we have created designated  Provider Care Teams.  These Care Teams include your primary Cardiologist (physician) and Advanced Practice Providers (APPs -  Physician Assistants and Nurse Practitioners) who all work together to provide you with the care you need, when you need it.  We recommend signing up for the patient portal called "MyChart".  Sign up information is provided on this After Visit Summary.  MyChart is used to connect with patients for Virtual Visits (Telemedicine).  Patients are able to view lab/test results, encounter notes, upcoming appointments, etc.  Non-urgent messages can be sent to your provider as well.   To learn more about what you can do with MyChart, go to [email or web address].    Your next appointment:   6 months with Dr Graciela Husbands
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
## 2023-04-20 ENCOUNTER — Ambulatory Visit (HOSPITAL_BASED_OUTPATIENT_CLINIC_OR_DEPARTMENT_OTHER): Payer: Medicare Other | Attending: Cardiovascular Disease | Admitting: Cardiovascular Disease

## 2023-04-20 ENCOUNTER — Encounter (HOSPITAL_BASED_OUTPATIENT_CLINIC_OR_DEPARTMENT_OTHER): Payer: Self-pay

## 2023-04-20 LAB — CUP PACEART INCLINIC DEVICE CHECK
Date Time Interrogation Session: 20241211123231
Implantable Lead Connection Status: 753985
Implantable Lead Connection Status: 753985
Implantable Lead Connection Status: 753985
Implantable Lead Implant Date: 20180214
Implantable Lead Implant Date: 20180214
Implantable Lead Implant Date: 20240906
Implantable Lead Location: 753859
Implantable Lead Location: 753860
Implantable Lead Location: 753860
Implantable Lead Model: 3830
Implantable Lead Model: 5076
Implantable Lead Model: 5076
Implantable Pulse Generator Implant Date: 20240906

## 2023-04-21 ENCOUNTER — Encounter: Payer: Self-pay | Admitting: *Deleted

## 2023-04-21 ENCOUNTER — Encounter: Payer: Medicare Other | Attending: Cardiovascular Disease | Admitting: *Deleted

## 2023-04-21 DIAGNOSIS — I5022 Chronic systolic (congestive) heart failure: Secondary | ICD-10-CM

## 2023-04-21 NOTE — Progress Notes (Signed)
Virtual orientation call completed today. he has an appointment on Date: 04/24/2023  for EP eval and gym Orientation.  Documentation of diagnosis can be found in [facility] Date: 04/11/2023 .

## 2023-04-23 ENCOUNTER — Other Ambulatory Visit
Admission: RE | Admit: 2023-04-23 | Discharge: 2023-04-23 | Disposition: A | Discharge status: Home / Self Care | Payer: Medicare Other | Attending: Internal Medicine | Admitting: Internal Medicine

## 2023-04-23 DIAGNOSIS — I442 Atrioventricular block, complete: Secondary | ICD-10-CM | POA: Insufficient documentation

## 2023-04-23 DIAGNOSIS — Z79899 Other long term (current) drug therapy: Secondary | ICD-10-CM | POA: Insufficient documentation

## 2023-04-23 DIAGNOSIS — I4729 Other ventricular tachycardia: Secondary | ICD-10-CM | POA: Diagnosis not present

## 2023-04-23 DIAGNOSIS — Z950 Presence of cardiac pacemaker: Secondary | ICD-10-CM | POA: Insufficient documentation

## 2023-04-23 DIAGNOSIS — I5022 Chronic systolic (congestive) heart failure: Secondary | ICD-10-CM | POA: Insufficient documentation

## 2023-04-23 DIAGNOSIS — I4819 Other persistent atrial fibrillation: Secondary | ICD-10-CM | POA: Diagnosis not present

## 2023-04-23 DIAGNOSIS — I495 Sick sinus syndrome: Secondary | ICD-10-CM | POA: Insufficient documentation

## 2023-04-23 LAB — BASIC METABOLIC PANEL
Anion gap: 8 (ref 5–15)
BUN: 32 mg/dL — ABNORMAL HIGH (ref 8–23)
CO2: 28 mmol/L (ref 22–32)
Calcium: 9.3 mg/dL (ref 8.9–10.3)
Chloride: 101 mmol/L (ref 98–111)
Creatinine, Ser: 1.74 mg/dL — ABNORMAL HIGH (ref 0.61–1.24)
GFR, Estimated: 37 mL/min — ABNORMAL LOW (ref >60)
Glucose, Bld: 101 mg/dL — ABNORMAL HIGH (ref 70–99)
Potassium: 4.3 mmol/L (ref 3.5–5.1)
Sodium: 137 mmol/L (ref 135–145)

## 2023-04-23 LAB — MAGNESIUM: Magnesium: 2.5 mg/dL — ABNORMAL HIGH (ref 1.7–2.4)

## 2023-04-24 ENCOUNTER — Ambulatory Visit: Payer: Medicare Other

## 2023-04-29 ENCOUNTER — Encounter (HOSPITAL_COMMUNITY): Payer: Self-pay

## 2023-04-29 ENCOUNTER — Observation Stay (HOSPITAL_COMMUNITY): Payer: Medicare Other

## 2023-04-29 ENCOUNTER — Other Ambulatory Visit: Payer: Self-pay

## 2023-04-29 ENCOUNTER — Inpatient Hospital Stay (HOSPITAL_COMMUNITY)
Admission: EM | Admit: 2023-04-29 | Discharge: 2023-05-05 | DRG: 683 | Disposition: A | Discharge status: Home / Self Care | Payer: Medicare Other | Attending: Internal Medicine | Admitting: Internal Medicine

## 2023-04-29 ENCOUNTER — Emergency Department (HOSPITAL_COMMUNITY): Payer: Medicare Other

## 2023-04-29 DIAGNOSIS — E785 Hyperlipidemia, unspecified: Secondary | ICD-10-CM | POA: Diagnosis present

## 2023-04-29 DIAGNOSIS — I4819 Other persistent atrial fibrillation: Secondary | ICD-10-CM | POA: Diagnosis present

## 2023-04-29 DIAGNOSIS — Z85528 Personal history of other malignant neoplasm of kidney: Secondary | ICD-10-CM

## 2023-04-29 DIAGNOSIS — R7881 Bacteremia: Secondary | ICD-10-CM | POA: Diagnosis not present

## 2023-04-29 DIAGNOSIS — I442 Atrioventricular block, complete: Secondary | ICD-10-CM | POA: Diagnosis not present

## 2023-04-29 DIAGNOSIS — I5022 Chronic systolic (congestive) heart failure: Secondary | ICD-10-CM | POA: Diagnosis not present

## 2023-04-29 DIAGNOSIS — N3001 Acute cystitis with hematuria: Secondary | ICD-10-CM | POA: Diagnosis not present

## 2023-04-29 DIAGNOSIS — B9620 Unspecified Escherichia coli [E. coli] as the cause of diseases classified elsewhere: Secondary | ICD-10-CM | POA: Diagnosis present

## 2023-04-29 DIAGNOSIS — I4821 Permanent atrial fibrillation: Secondary | ICD-10-CM | POA: Diagnosis not present

## 2023-04-29 DIAGNOSIS — Z79890 Hormone replacement therapy (postmenopausal): Secondary | ICD-10-CM

## 2023-04-29 DIAGNOSIS — Z79899 Other long term (current) drug therapy: Secondary | ICD-10-CM

## 2023-04-29 DIAGNOSIS — Z1612 Extended spectrum beta lactamase (ESBL) resistance: Secondary | ICD-10-CM | POA: Diagnosis not present

## 2023-04-29 DIAGNOSIS — Z1152 Encounter for screening for COVID-19: Secondary | ICD-10-CM | POA: Diagnosis not present

## 2023-04-29 DIAGNOSIS — Z8249 Family history of ischemic heart disease and other diseases of the circulatory system: Secondary | ICD-10-CM

## 2023-04-29 DIAGNOSIS — K219 Gastro-esophageal reflux disease without esophagitis: Secondary | ICD-10-CM | POA: Diagnosis not present

## 2023-04-29 DIAGNOSIS — S2241XA Multiple fractures of ribs, right side, initial encounter for closed fracture: Secondary | ICD-10-CM | POA: Diagnosis not present

## 2023-04-29 DIAGNOSIS — N200 Calculus of kidney: Secondary | ICD-10-CM | POA: Diagnosis present

## 2023-04-29 DIAGNOSIS — N179 Acute kidney failure, unspecified: Principal | ICD-10-CM | POA: Diagnosis present

## 2023-04-29 DIAGNOSIS — N320 Bladder-neck obstruction: Secondary | ICD-10-CM | POA: Diagnosis present

## 2023-04-29 DIAGNOSIS — M109 Gout, unspecified: Secondary | ICD-10-CM | POA: Diagnosis present

## 2023-04-29 DIAGNOSIS — I130 Hypertensive heart and chronic kidney disease with heart failure and stage 1 through stage 4 chronic kidney disease, or unspecified chronic kidney disease: Secondary | ICD-10-CM | POA: Diagnosis present

## 2023-04-29 DIAGNOSIS — Z888 Allergy status to other drugs, medicaments and biological substances status: Secondary | ICD-10-CM

## 2023-04-29 DIAGNOSIS — Z7901 Long term (current) use of anticoagulants: Secondary | ICD-10-CM | POA: Diagnosis not present

## 2023-04-29 DIAGNOSIS — R197 Diarrhea, unspecified: Secondary | ICD-10-CM | POA: Diagnosis not present

## 2023-04-29 DIAGNOSIS — I10 Essential (primary) hypertension: Secondary | ICD-10-CM | POA: Diagnosis present

## 2023-04-29 DIAGNOSIS — M549 Dorsalgia, unspecified: Secondary | ICD-10-CM | POA: Diagnosis present

## 2023-04-29 DIAGNOSIS — R8281 Pyuria: Secondary | ICD-10-CM | POA: Diagnosis not present

## 2023-04-29 DIAGNOSIS — Z91041 Radiographic dye allergy status: Secondary | ICD-10-CM

## 2023-04-29 DIAGNOSIS — N12 Tubulo-interstitial nephritis, not specified as acute or chronic: Secondary | ICD-10-CM | POA: Diagnosis not present

## 2023-04-29 DIAGNOSIS — F39 Unspecified mood [affective] disorder: Secondary | ICD-10-CM | POA: Diagnosis present

## 2023-04-29 DIAGNOSIS — Z8673 Personal history of transient ischemic attack (TIA), and cerebral infarction without residual deficits: Secondary | ICD-10-CM

## 2023-04-29 DIAGNOSIS — G629 Polyneuropathy, unspecified: Secondary | ICD-10-CM | POA: Diagnosis present

## 2023-04-29 DIAGNOSIS — K8020 Calculus of gallbladder without cholecystitis without obstruction: Secondary | ICD-10-CM | POA: Diagnosis not present

## 2023-04-29 DIAGNOSIS — E291 Testicular hypofunction: Secondary | ICD-10-CM | POA: Diagnosis present

## 2023-04-29 DIAGNOSIS — Z950 Presence of cardiac pacemaker: Secondary | ICD-10-CM

## 2023-04-29 DIAGNOSIS — G8929 Other chronic pain: Secondary | ICD-10-CM | POA: Diagnosis present

## 2023-04-29 DIAGNOSIS — Z8261 Family history of arthritis: Secondary | ICD-10-CM

## 2023-04-29 DIAGNOSIS — N1832 Chronic kidney disease, stage 3b: Secondary | ICD-10-CM | POA: Diagnosis present

## 2023-04-29 DIAGNOSIS — E782 Mixed hyperlipidemia: Secondary | ICD-10-CM | POA: Diagnosis not present

## 2023-04-29 DIAGNOSIS — N390 Urinary tract infection, site not specified: Secondary | ICD-10-CM

## 2023-04-29 DIAGNOSIS — B9629 Other Escherichia coli [E. coli] as the cause of diseases classified elsewhere: Secondary | ICD-10-CM | POA: Diagnosis not present

## 2023-04-29 DIAGNOSIS — Z85828 Personal history of other malignant neoplasm of skin: Secondary | ICD-10-CM

## 2023-04-29 DIAGNOSIS — R509 Fever, unspecified: Secondary | ICD-10-CM | POA: Diagnosis not present

## 2023-04-29 DIAGNOSIS — Z823 Family history of stroke: Secondary | ICD-10-CM

## 2023-04-29 DIAGNOSIS — Z87440 Personal history of urinary (tract) infections: Secondary | ICD-10-CM

## 2023-04-29 DIAGNOSIS — Z880 Allergy status to penicillin: Secondary | ICD-10-CM

## 2023-04-29 LAB — COMPREHENSIVE METABOLIC PANEL
ALT: 10 U/L (ref 0–44)
AST: 15 U/L (ref 15–41)
Albumin: 3.5 g/dL (ref 3.5–5.0)
Alkaline Phosphatase: 55 U/L (ref 38–126)
Anion gap: 7 (ref 5–15)
BUN: 36 mg/dL — ABNORMAL HIGH (ref 8–23)
CO2: 28 mmol/L (ref 22–32)
Calcium: 8.6 mg/dL — ABNORMAL LOW (ref 8.9–10.3)
Chloride: 99 mmol/L (ref 98–111)
Creatinine, Ser: 2.4 mg/dL — ABNORMAL HIGH (ref 0.61–1.24)
GFR, Estimated: 25 mL/min — ABNORMAL LOW (ref >60)
Glucose, Bld: 127 mg/dL — ABNORMAL HIGH (ref 70–99)
Potassium: 3.8 mmol/L (ref 3.5–5.1)
Sodium: 134 mmol/L — ABNORMAL LOW (ref 135–145)
Total Bilirubin: 0.8 mg/dL (ref <1.2)
Total Protein: 6.7 g/dL (ref 6.5–8.1)

## 2023-04-29 LAB — URINALYSIS, ROUTINE W REFLEX MICROSCOPIC
Bilirubin Urine: NEGATIVE
Glucose, UA: NEGATIVE mg/dL
Ketones, ur: NEGATIVE mg/dL
Nitrite: POSITIVE — AB
Protein, ur: 100 mg/dL — AB
Specific Gravity, Urine: 1.012 (ref 1.005–1.030)
WBC, UA: >50 WBC/hpf (ref 0–5)
pH: 5 (ref 5.0–8.0)

## 2023-04-29 LAB — CBC WITH DIFFERENTIAL/PLATELET
Abs Immature Granulocytes: 0.08 10*3/uL — ABNORMAL HIGH (ref 0.00–0.07)
Basophils Absolute: 0.1 10*3/uL (ref 0.0–0.1)
Basophils Relative: 0 %
Eosinophils Absolute: 0 10*3/uL (ref 0.0–0.5)
Eosinophils Relative: 0 %
HCT: 44.5 % (ref 39.0–52.0)
Hemoglobin: 14.1 g/dL (ref 13.0–17.0)
Immature Granulocytes: 1 %
Lymphocytes Relative: 8 %
Lymphs Abs: 1.3 10*3/uL (ref 0.7–4.0)
MCH: 28.6 pg (ref 26.0–34.0)
MCHC: 31.7 g/dL (ref 30.0–36.0)
MCV: 90.3 fL (ref 80.0–100.0)
Monocytes Absolute: 1.9 10*3/uL — ABNORMAL HIGH (ref 0.1–1.0)
Monocytes Relative: 12 %
Neutro Abs: 13.2 10*3/uL — ABNORMAL HIGH (ref 1.7–7.7)
Neutrophils Relative %: 79 %
Platelets: 177 10*3/uL (ref 150–400)
RBC: 4.93 MIL/uL (ref 4.22–5.81)
RDW: 14.5 % (ref 11.5–15.5)
WBC: 16.5 10*3/uL — ABNORMAL HIGH (ref 4.0–10.5)
nRBC: 0 % (ref 0.0–0.2)

## 2023-04-29 LAB — RESP PANEL BY RT-PCR (RSV, FLU A&B, COVID)  RVPGX2
Influenza A by PCR: NEGATIVE
Influenza B by PCR: NEGATIVE
Resp Syncytial Virus by PCR: NEGATIVE
SARS Coronavirus 2 by RT PCR: NEGATIVE

## 2023-04-29 LAB — I-STAT CG4 LACTIC ACID, ED
Lactic Acid, Venous: 1.3 mmol/L (ref 0.5–1.9)
Lactic Acid, Venous: 1.1 mmol/L (ref 0.5–1.9)

## 2023-04-29 MED ORDER — APIXABAN 2.5 MG PO TABS
2.5000 mg | ORAL_TABLET | Freq: Two times a day (BID) | ORAL | Status: DC
  Administered 2023-04-29 – 2023-05-05 (×12): 2.5 mg via ORAL
  Filled 2023-04-29 (×12): qty 1

## 2023-04-29 MED ORDER — DOFETILIDE 125 MCG PO CAPS
125.0000 ug | ORAL_CAPSULE | Freq: Two times a day (BID) | ORAL | Status: DC
  Administered 2023-04-30 – 2023-05-05 (×11): 125 ug via ORAL
  Filled 2023-04-29 (×11): qty 1

## 2023-04-29 MED ORDER — ONDANSETRON HCL 4 MG/2ML IJ SOLN: 4.0000 mg | Freq: Four times a day (QID) | INTRAMUSCULAR | Status: DC | PRN

## 2023-04-29 MED ORDER — GABAPENTIN 300 MG PO CAPS
300.0000 mg | ORAL_CAPSULE | Freq: Every day | ORAL | Status: DC
  Administered 2023-04-29 – 2023-05-04 (×6): 300 mg via ORAL
  Filled 2023-04-29 (×4): qty 1
  Filled 2023-04-29: qty 3
  Filled 2023-04-29: qty 1

## 2023-04-29 MED ORDER — SODIUM CHLORIDE 0.9 % IV BOLUS
1000.0000 mL | Freq: Once | INTRAVENOUS | Status: AC
  Administered 2023-04-29: 1000 mL via INTRAVENOUS

## 2023-04-29 MED ORDER — ACETAMINOPHEN 500 MG PO TABS
1000.0000 mg | ORAL_TABLET | Freq: Once | ORAL | Status: AC
  Administered 2023-04-29: 1000 mg via ORAL
  Filled 2023-04-29: qty 2

## 2023-04-29 MED ORDER — LACTATED RINGERS IV SOLN: INTRAVENOUS | Status: AC

## 2023-04-29 MED ORDER — BUPROPION HCL ER (XL) 150 MG PO TB24
150.0000 mg | ORAL_TABLET | Freq: Every day | ORAL | Status: DC
  Administered 2023-04-30 – 2023-05-05 (×6): 150 mg via ORAL
  Filled 2023-04-29 (×6): qty 1

## 2023-04-29 MED ORDER — ACETAMINOPHEN 325 MG PO TABS
650.0000 mg | ORAL_TABLET | Freq: Four times a day (QID) | ORAL | Status: DC | PRN
  Administered 2023-04-29 – 2023-04-30 (×2): 650 mg via ORAL
  Filled 2023-04-29 (×2): qty 2

## 2023-04-29 MED ORDER — PANTOPRAZOLE SODIUM 40 MG PO TBEC
40.0000 mg | DELAYED_RELEASE_TABLET | Freq: Every day | ORAL | Status: DC
  Administered 2023-04-30 – 2023-05-05 (×6): 40 mg via ORAL
  Filled 2023-04-29 (×6): qty 1

## 2023-04-29 MED ORDER — FEBUXOSTAT 40 MG PO TABS
40.0000 mg | ORAL_TABLET | Freq: Every day | ORAL | Status: DC
  Administered 2023-04-30 – 2023-05-05 (×6): 40 mg via ORAL
  Filled 2023-04-29 (×6): qty 1

## 2023-04-29 MED ORDER — PIPERACILLIN-TAZOBACTAM IN DEX 2-0.25 GM/50ML IV SOLN
2.2500 g | Freq: Three times a day (TID) | INTRAVENOUS | Status: DC
  Administered 2023-04-30 (×2): 2.25 g via INTRAVENOUS
  Filled 2023-04-29 (×4): qty 50

## 2023-04-29 MED ORDER — METOPROLOL SUCCINATE ER 50 MG PO TB24
50.0000 mg | ORAL_TABLET | Freq: Every day | ORAL | Status: DC
  Administered 2023-04-29 – 2023-05-05 (×7): 50 mg via ORAL
  Filled 2023-04-29 (×7): qty 1

## 2023-04-29 MED ORDER — ATORVASTATIN CALCIUM 40 MG PO TABS
40.0000 mg | ORAL_TABLET | Freq: Every day | ORAL | Status: DC
  Administered 2023-04-29 – 2023-05-05 (×7): 40 mg via ORAL
  Filled 2023-04-29 (×7): qty 1

## 2023-04-29 MED ORDER — DULOXETINE HCL 20 MG PO CPEP
20.0000 mg | ORAL_CAPSULE | Freq: Every day | ORAL | Status: DC
  Administered 2023-04-30 – 2023-05-05 (×6): 20 mg via ORAL
  Filled 2023-04-29 (×6): qty 1

## 2023-04-29 NOTE — Assessment & Plan Note (Addendum)
-   Creatinine elevated to 2.40 from baseline of 1.4.  Likely pre-renal from decrease intake.  -Urology Dr. Laverle Patter was consulted and recommended monitoring of PVR with bladder with potential placement of Foley catheter.   -Has already been administered a liter of fluid in ED. Will keep on continuous gentle IV fluid overnight.

## 2023-04-29 NOTE — Progress Notes (Signed)
Pharmacy Antibiotic Note  Thomas [name] is a 89 y.o. male admitted on 04/29/2023. Patient reports hematuria and urgency. He states he has been on antibiotics for the last two weeks for UTI but fever returned night PTA. Patient with history of multi-drug resistant organism. Pharmacy has been consulted for Zosyn dosing.  Plan: -Zosyn 2.25 g IV q8h (30 min infusion) based on current renal function -Continue to follow renal function, cultures and clinical progress for dose adjustments and de-escalation as indicated  Height: 5\' 5"  (165.1 cm) Weight: 70.3 kg (155 lb) IBW/kg (Calculated) : 61.5  Temp (24hrs), Avg:98.3 F (36.8 C), Min:97.8 F (36.6 C), Max:98.9 F (37.2 C)  Recent Labs  Lab 04/23/23 1217 04/29/23 1548 04/29/23 1615 04/29/23 1756  WBC  --  16.5*  --   --   CREATININE 1.74* 2.40*  --   --   LATICACIDVEN  --   --  1.3 1.1    Estimated Creatinine Clearance: 18.5 mL/min (A) (by C-G formula based on SCr of 2.4 mg/dL (H)).    Allergies  Allergen Reactions   Other     Vicryl sutures - patient states that site becomes "soupy"    Iodine Hives and Other (See Comments)    IVP contrast   Penicillins Itching, Rash and Other (See Comments)    ITCHY FEELING IN FINGERS Has patient had a PCN reaction causing immediate rash, facial/tongue/throat swelling, SOB or lightheadedness with hypotension: No Has patient had a PCN reaction causing severe rash involving mucus membranes or skin necrosis: No Has patient had a PCN reaction that required hospitalization No Has patient had a PCN reaction occurring within the last 10 years: No If all of the above answers are "NO", then may proceed with Cephalosporin use.     Antimicrobials this admission: Zosyn 12/24 >>  Dose adjustments this admission: NA  Microbiology results: 12/24 BCx: pending 12/24 UCx: pending   12/04 UCx: ESBL E.coli   Thank you for allowing pharmacy to be a part of this patient's care.  Pricilla Riffle,  PharmD, BCPS Clinical Pharmacist 04/29/2023 8:01 PM

## 2023-04-29 NOTE — Consult Note (Signed)
Urology Consult   Physician requesting consult: Dr. 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 retired Insurance underwriter.  He has a history of BPH and urinary retention s/p HOLEP about 3 years ago by Dr. Apolinar Junes in [place]. He says he had a large right sided bladder diverticulum at that time that held about 1 L. He has some residual stress incontinence at baseline but has not had UTIs until about 2 weeks ago when he was in [place] and developed cloudy, foul smelling urine.  He has been treated with doxycyline and nitrofurantoin and improved but never felt he was back to baseline.  He developed worsening foul smelling urine and fever to 103 last night and presented to the ED for evaluation.  His baseline Cr is about 1.7.  His urologic history is otherwise significant for a small left renal cell carcinoma treated with cryoablation in 2006.   Past Medical History:  Diagnosis Date   Aortic valve disorders    Arthritis    Atrial flutter (HCC) 06/18/2016   "AF or AFl; not sure which" (06/23/2016)   Basal cell carcinoma    "face, nose left shoulder, left arm" (06/19/2016)   Basal cell carcinoma 09/13/2020   right temple   BBB (bundle branch block)    hx right   Chronic back pain    "neck, thoracic, lower back" (06/19/2016)   Complete heart block (HCC) 06/2016   Dyspnea    GERD (gastroesophageal reflux disease)    Gout    Heart block    "I've had type I, II Wenke before now" (06/19/2016)   History of gout    History of hiatal hernia    "self dx'd" (06/19/2016)   Hyperlipidemia    Hypertension    Lyme disease    "dx'd by me 2003; cx's showed dx 08/2015"   Migraine    "3-4/year" (06/19/2016)   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   PVC's (premature ventricular contractions)    Renal cancer, left (HCC) 2006   S/P cryotherapy   Spinal stenosis    "cervical, 1 thoracic, lumbar" (06/19/2016)    Squamous carcinoma    "face, nose left shoulder, left arm" (06/19/2016)   Stroke [facility])    TIA (transient ischemic attack) 06/14/2016   "I'm not sure that's what it was" (06/25/2016)   Visit for monitoring Tikosyn therapy 09/09/2017    Past Surgical History:  Procedure Laterality Date   ANKLE FRACTURE SURGERY Right 1967   BACK SURGERY  05/07/2020   BASAL CELL CARCINOMA EXCISION     "face, nose left shoulder, left arm"   BIOPSY PROSTATE  2001 & 2003   BIV UPGRADE N/A 01/10/2023   Procedure: BIV UPGRADE;  Surgeon: Duke Salvia, MD;  Location: [facility] INVASIVE CV LAB;  Service: Cardiovascular;  Laterality: N/A;   CARDIAC CATHETERIZATION  1990's   CARDIOVERSION N/A 09/11/2017   Procedure: CARDIOVERSION;  Surgeon: Lewayne Bunting, MD;  Location: [facility] ENDOSCOPY;  Service: Cardiovascular;  Laterality: N/A;   FRACTURE SURGERY     HOLEP-LASER ENUCLEATION OF THE PROSTATE WITH MORCELLATION N/A 07/10/2020   Procedure: HOLEP-LASER ENUCLEATION OF THE PROSTATE WITH MORCELLATION;  Surgeon: Vanna Scotland, MD;  Location: [facility] ORS;  Service: Urology;  Laterality: N/A;   INGUINAL HERNIA REPAIR Left 2012   INSERT / REPLACE / REMOVE PACEMAKER  06/19/2016   LAPAROSCOPIC ABLATION RENAL MASS     LEFT  HEART CATH AND CORONARY ANGIOGRAPHY Left 10/23/2017   Procedure: LEFT HEART CATH AND CORONARY ANGIOGRAPHY;  Surgeon: Iran Ouch, MD;  Location: [facility] INVASIVE CV LAB;  Service: Cardiovascular;  Laterality: Left;   LEFT HEART CATH AND CORONARY ANGIOGRAPHY Left 02/26/2021   Procedure: LEFT HEART CATH AND CORONARY ANGIOGRAPHY;  Surgeon: Iran Ouch, MD;  Location: [facility] INVASIVE CV LAB;  Service: Cardiovascular;  Laterality: Left;   PACEMAKER IMPLANT N/A 06/19/2016   Procedure: Pacemaker Implant;  Surgeon: Duke Salvia, MD;  Location: [facility] INVASIVE CV LAB;  Service: Cardiovascular;  Laterality: N/A;   pacemasker     PROSTATE SURGERY     RIGHT/LEFT HEART CATH AND CORONARY ANGIOGRAPHY Bilateral 11/25/2022    Procedure: RIGHT/LEFT HEART CATH AND CORONARY ANGIOGRAPHY;  Surgeon: Iran Ouch, MD;  Location: [facility] INVASIVE CV LAB;  Service: Cardiovascular;  Laterality: Bilateral;   SQUAMOUS CELL CARCINOMA EXCISION     "face, nose left shoulder, left arm"   TONSILLECTOMY AND ADENOIDECTOMY      Current Hospital Medications:  Home Meds:  No current facility-administered medications on file prior to encounter.   Current Outpatient Medications on File Prior to Encounter  Medication Sig Dispense Refill   acetaminophen (TYLENOL) 500 MG tablet Take 500-1,000 mg by mouth every 6 (six) hours as needed for mild pain (pain score 1-3) or fever.     apixaban (ELIQUIS) 2.5 MG TABS tablet Take 1 tablet (2.5 mg total) by mouth 2 (two) times daily. 90 tablet 3   Artificial Tears ophthalmic solution Place 1 drop into both eyes in the morning, at noon, in the evening, and at bedtime.     buPROPion (WELLBUTRIN XL) 150 MG 24 hr tablet Take 150 mg by mouth daily.     calcium carbonate (TUMS - DOSED IN MG ELEMENTAL CALCIUM) 500 MG chewable tablet Chew 1-2 tablets by mouth daily as needed for indigestion.     Coenzyme Q10 (COQ10) 100 MG CAPS Take 100 mg by mouth daily.     colchicine 0.6 MG tablet Take 0.6 mg by mouth daily as needed (for gout flares).     docusate sodium (COLACE) 100 MG capsule Take 100 mg by mouth daily as needed for mild constipation.     dofetilide (TIKOSYN) 125 MCG capsule Take 1 capsule (125 mcg total) by mouth 2 (two) times daily. 180 capsule 2   DULoxetine (CYMBALTA) 20 MG capsule Take 20 mg by mouth 2 (two) times daily.     gabapentin (NEURONTIN) 300 MG capsule Take 300 mg by mouth at bedtime.     hydrALAZINE (APRESOLINE) 25 MG tablet Take 25 mg by mouth as needed (for a Systolic reading of 160 that will not decrease).     omeprazole (PRILOSEC) 20 MG capsule Take 20 mg by mouth daily as needed (for heartburn- 30 minutes before a meal).     sacubitril-valsartan (ENTRESTO) 24-26 MG Take 1 tablet  by mouth 2 (two) times daily. 180 tablet 3   Testosterone 1.62 % GEL APPLY 2 PUMPS DAILY (Patient taking differently: Apply 2 Pump topically daily.) 75 g 5   testosterone cypionate (DEPOTESTOSTERONE CYPIONATE) 200 MG/ML injection Inject 1 mL (200 mg total) into the muscle every 28 (twenty-eight) days. 10 mL 0   atorvastatin (LIPITOR) 40 MG tablet Take 1 tablet (40 mg total) by mouth daily. 90 tablet 3   febuxostat (ULORIC) 40 MG tablet Take 1 tablet (40 mg total) by mouth daily. 90 tablet 3   furosemide (LASIX) 40 MG tablet Take  one tablet by mouth every other day x 3 doses. (Patient taking differently: Take 20 mg by mouth daily as needed for fluid or edema.) 30 tablet 0   methocarbamol (ROBAXIN) 500 MG tablet Take 500 mg by mouth every 6 (six) hours as needed for muscle spasms.     metoprolol succinate (TOPROL-XL) 50 MG 24 hr tablet TAKE 1 TABLET BY MOUTH DAILY WITH OR IMMEDIATELY FOLLOWING A MEAL (Patient taking differently: Take 50 mg by mouth at bedtime.) 90 tablet 3   metroNIDAZOLE (FLAGYL) 500 MG tablet TAKE 1 TABLET BY MOUTH TWICE DAILY ON SUNDAY AND MONDAY. (Patient taking differently: Take 1,000 mg by mouth See admin instructions. Take 1,000 mg by mouth on Saturday and Sunday- EVERY OTHER WEEKEND) 90 tablet 3   NEEDLE, DISP, 18 G (BD DISP NEEDLES) 18G X 1-1/2" MISC 1 mg by Does not apply route every 14 (fourteen) days. 50 each 0   NEEDLE, DISP, 21 G (BD DISP NEEDLES) 21G X 1-1/2" MISC 1 mg by Does not apply route every 14 (fourteen) days. 50 each 0   Syringe, Disposable, (2-3CC SYRINGE) 3 ML MISC 1 mg by Does not apply route every 14 (fourteen) days. 25 each 3   terbinafine (LAMISIL) 250 MG tablet Take 1 tablet (250 mg total) by mouth daily. Take with food. (Patient not taking: Reported on 04/29/2023) 30 tablet 0     Scheduled Meds:  atorvastatin  40 mg Oral Daily   [START ON 04/30/2023] buPROPion  150 mg Oral Daily   [START ON 04/30/2023] dofetilide  125 mcg Oral BID   [START ON  04/30/2023] DULoxetine  20 mg Oral Daily   febuxostat  40 mg Oral Daily   gabapentin  300 mg Oral QHS   metoprolol succinate  50 mg Oral Daily   [START ON 04/30/2023] pantoprazole  40 mg Oral Daily   Continuous Infusions:  piperacillin-tazobactam (ZOSYN)  IV     PRN Meds:.acetaminophen, ondansetron (ZOFRAN) IV  Allergies:  Allergies  Allergen Reactions   Other Other (See Comments)    Vicryl sutures - patient states that site becomes "soupy"   Iodinated Contrast Media Hives   Iodine Hives and Other (See Comments)    IVP contrast   Penicillins Itching, Rash and Other (See Comments)    ITCHY FEELING IN FINGERS Has patient had a PCN reaction causing immediate rash, facial/tongue/throat swelling, SOB or lightheadedness with hypotension: No Has patient had a PCN reaction causing severe rash involving mucus membranes or skin necrosis: No Has patient had a PCN reaction that required hospitalization No Has patient had a PCN reaction occurring within the last 10 years: No If all of the above answers are "NO", then may proceed with Cephalosporin use.     Family History  Problem Relation Age of Onset   Heart attack Brother    Stroke Brother    Aortic stenosis Mother    Arthritis Father     Social History:  reports that he has never smoked. He has never used smokeless tobacco. He reports current alcohol use of about 1.0 standard drink of alcohol per week. He reports that he does not use drugs.  ROS: A complete review of systems was performed.  All systems are negative except for pertinent findings as noted.  Physical Exam:  Vital signs in last 24 hours: Temp:  [97.8 F (36.6 C)-98.9 F (37.2 C)] 98.5 F (36.9 C) (12/24 2028) Pulse Rate:  [59-93] 63 (12/24 2028) Resp:  [17-18] 18 (12/24 2028) BP: (87-134)/(54-75)  118/59 (12/24 2028) SpO2:  [93 %-97 %] 97 % (12/24 2028) Weight:  [70.3 kg] 70.3 kg (12/24 1429) Constitutional:  Alert and oriented, No acute  distress Cardiovascular: Regular rate and rhythm, No JVD Respiratory: Normal respiratory effort, Lungs clear bilaterally GI: Abdomen is soft, nontender, nondistended, no abdominal masses GU: No CVA tenderness Lymphatic: No lymphadenopathy Neurologic: Grossly intact, no focal deficits Psychiatric: Normal mood and affect  Laboratory Data:  Recent Labs    04/29/23 1548  WBC 16.5*  HGB 14.1  HCT 44.5  PLT 177    Recent Labs    04/29/23 1548  NA 134*  K 3.8  CL 99  GLUCOSE 127*  BUN 36*  CALCIUM 8.6*  CREATININE 2.40*     Results for orders placed or performed during the hospital encounter of 04/29/23 (from the past 24 hours)  Urinalysis, Routine w reflex microscopic -Urine, Clean Catch     Status: Abnormal   Collection Time: 04/29/23  2:55 PM  Result Value Ref Range   Color, Urine AMBER (A) YELLOW   APPearance CLOUDY (A) CLEAR   Specific Gravity, Urine 1.012 1.005 - 1.030   pH 5.0 5.0 - 8.0   Glucose, UA NEGATIVE NEGATIVE mg/dL   Hgb urine dipstick MODERATE (A) NEGATIVE   Bilirubin Urine NEGATIVE NEGATIVE   Ketones, ur NEGATIVE NEGATIVE mg/dL   Protein, ur 295 (A) NEGATIVE mg/dL   Nitrite POSITIVE (A) NEGATIVE   Leukocytes,Ua LARGE (A) NEGATIVE   RBC / HPF 21-50 0 - 5 RBC/hpf   WBC, UA >50 0 - 5 WBC/hpf   Bacteria, UA MANY (A) NONE SEEN   Squamous Epithelial / HPF 0-5 0 - 5 /HPF   WBC Clumps PRESENT    Non Squamous Epithelial 0-5 (A) NONE SEEN  CBC with Differential     Status: Abnormal   Collection Time: 04/29/23  3:48 PM  Result Value Ref Range   WBC 16.5 (H) 4.0 - 10.5 K/uL   RBC 4.93 4.22 - 5.81 MIL/uL   Hemoglobin 14.1 13.0 - 17.0 g/dL   HCT 62.1 30.8 - 65.7 %   MCV 90.3 80.0 - 100.0 fL   MCH 28.6 26.0 - 34.0 pg   MCHC 31.7 30.0 - 36.0 g/dL   RDW 84.6 96.2 - 95.2 %   Platelets 177 150 - 400 K/uL   nRBC 0.0 0.0 - 0.2 %   Neutrophils Relative % 79 %   Neutro Abs 13.2 (H) 1.7 - 7.7 K/uL   Lymphocytes Relative 8 %   Lymphs Abs 1.3 0.7 - 4.0 K/uL    Monocytes Relative 12 %   Monocytes Absolute 1.9 (H) 0.1 - 1.0 K/uL   Eosinophils Relative 0 %   Eosinophils Absolute 0.0 0.0 - 0.5 K/uL   Basophils Relative 0 %   Basophils Absolute 0.1 0.0 - 0.1 K/uL   Immature Granulocytes 1 %   Abs Immature Granulocytes 0.08 (H) 0.00 - 0.07 K/uL  Comprehensive metabolic panel     Status: Abnormal   Collection Time: 04/29/23  3:48 PM  Result Value Ref Range   Sodium 134 (L) 135 - 145 mmol/L   Potassium 3.8 3.5 - 5.1 mmol/L   Chloride 99 98 - 111 mmol/L   CO2 28 22 - 32 mmol/L   Glucose, Bld 127 (H) 70 - 99 mg/dL   BUN 36 (H) 8 - 23 mg/dL   Creatinine, Ser 8.41 (H) 0.61 - 1.24 mg/dL   Calcium 8.6 (L) 8.9 - 10.3 mg/dL   Total  Protein 6.7 6.5 - 8.1 g/dL   Albumin 3.5 3.5 - 5.0 g/dL   AST 15 15 - 41 U/L   ALT 10 0 - 44 U/L   Alkaline Phosphatase 55 38 - 126 U/L   Total Bilirubin 0.8 <1.2 mg/dL   GFR, Estimated 25 (L) >60 mL/min   Anion gap 7 5 - 15  Resp panel by RT-PCR (RSV, Flu A&B, Covid) Anterior Nasal Swab     Status: None   Collection Time: 04/29/23  3:48 PM   Specimen: Anterior Nasal Swab  Result Value Ref Range   SARS Coronavirus 2 by RT PCR NEGATIVE NEGATIVE   Influenza A by PCR NEGATIVE NEGATIVE   Influenza B by PCR NEGATIVE NEGATIVE   Resp Syncytial Virus by PCR NEGATIVE NEGATIVE  I-Stat CG4 Lactic Acid     Status: None   Collection Time: 04/29/23  4:15 PM  Result Value Ref Range   Lactic Acid, Venous 1.3 0.5 - 1.9 mmol/L  I-Stat CG4 Lactic Acid     Status: None   Collection Time: 04/29/23  5:56 PM  Result Value Ref Range   Lactic Acid, Venous 1.1 0.5 - 1.9 mmol/L   Recent Results (from the past 240 hours)  Resp panel by RT-PCR (RSV, Flu A&B, Covid) Anterior Nasal Swab     Status: None   Collection Time: 04/29/23  3:48 PM   Specimen: Anterior Nasal Swab  Result Value Ref Range Status   SARS Coronavirus 2 by RT PCR NEGATIVE NEGATIVE Final    Comment: (NOTE) SARS-CoV-2 target nucleic acids are NOT DETECTED.  The SARS-CoV-2  RNA is generally detectable in upper respiratory specimens during the acute phase of infection. The lowest concentration of SARS-CoV-2 viral copies this assay can detect is 138 copies/mL. A negative result does not preclude SARS-Cov-2 infection and should not be used as the sole basis for treatment or other patient management decisions. A negative result may occur with  improper specimen collection/handling, submission of specimen other than nasopharyngeal swab, presence of viral mutation(s) within the areas targeted by this assay, and inadequate number of viral copies(<138 copies/mL). A negative result must be combined with clinical observations, patient history, and epidemiological information. The expected result is Negative.  Fact Sheet for Patients:  BloggerCourse.com  Fact Sheet for Healthcare Providers:  SeriousBroker.it  This test is no t yet approved or cleared by the [place] FDA and  has been authorized for detection and/or diagnosis of SARS-CoV-2 by FDA under an Emergency Use Authorization (EUA). This EUA will remain  in effect (meaning this test can be used) for the duration of the COVID-19 declaration under Section 564(b)(1) of the Act, 21 U.S.C.section 360bbb-3(b)(1), unless the authorization is terminated  or revoked sooner.       Influenza A by PCR NEGATIVE NEGATIVE Final   Influenza B by PCR NEGATIVE NEGATIVE Final    Comment: (NOTE) The Xpert Xpress SARS-CoV-2/FLU/RSV plus assay is intended as an aid in the diagnosis of influenza from Nasopharyngeal swab specimens and should not be used as a sole basis for treatment. Nasal washings and aspirates are unacceptable for Xpert Xpress SARS-CoV-2/FLU/RSV testing.  Fact Sheet for Patients: BloggerCourse.com  Fact Sheet for Healthcare Providers: SeriousBroker.it  This test is not yet approved or cleared by the  [place] FDA and has been authorized for detection and/or diagnosis of SARS-CoV-2 by FDA under an Emergency Use Authorization (EUA). This EUA will remain in effect (meaning this test can be used) for the duration of  the COVID-19 declaration under Section 564(b)(1) of the Act, 21 U.S.C. section 360bbb-3(b)(1), unless the authorization is terminated or revoked.     Resp Syncytial Virus by PCR NEGATIVE NEGATIVE Final    Comment: (NOTE) Fact Sheet for Patients: BloggerCourse.com  Fact Sheet for Healthcare Providers: SeriousBroker.it  This test is not yet approved or cleared by the [place] FDA and has been authorized for detection and/or diagnosis of SARS-CoV-2 by FDA under an Emergency Use Authorization (EUA). This EUA will remain in effect (meaning this test can be used) for the duration of the COVID-19 declaration under Section 564(b)(1) of the Act, 21 U.S.C. section 360bbb-3(b)(1), unless the authorization is terminated or revoked.  Performed at [facility], [address] [place], [place] [address]     Renal Function: Recent Labs    04/23/23 1217 04/29/23 1548  CREATININE 1.74* 2.40*   Estimated Creatinine Clearance: 18.5 mL/min (A) (by C-G formula based on SCr of 2.4 mg/dL (H)).  Radiologic Imaging: CT ABDOMEN PELVIS WO CONTRAST Result Date: 04/29/2023 CLINICAL DATA:  Pyelonephritis suspected. History of renal stone or obstruction. History of urinary tract infection on antibiotics for 2 weeks. Fever starting last night. Hematuria and urgency. EXAM: CT ABDOMEN AND PELVIS WITHOUT CONTRAST TECHNIQUE: Multidetector CT imaging of the abdomen and pelvis was performed following the standard protocol without IV contrast. RADIATION DOSE REDUCTION: This exam was performed according to the departmental dose-optimization program which includes automated exposure control, adjustment of the mA and/or kV  according to patient size and/or use of iterative reconstruction technique. COMPARISON:  CT abdomen 08/22/2022. CT chest abdomen and pelvis 05/04/2020. FINDINGS: Lower chest: Mild dependent atelectasis in the lungs. Hepatobiliary: No focal liver lesions. Tiny stones in the gallbladder. No inflammatory changes. No bile duct dilatation. Pancreas: Unremarkable. No pancreatic ductal dilatation or surrounding inflammatory changes. Spleen: Normal in size without focal abnormality. Adrenals/Urinary Tract: No adrenal gland nodules. Parenchymal calcification in the left kidney measuring 4 mm diameter. No change since prior study. No hydronephrosis or hydroureter. No ureteral stones or bladder stones. Mild bladder wall thickening with stranding around the ureters possibly indicating cystitis and pyelonephritis. No abscess. Stomach/Bowel: Stomach, small bowel, and colon are not abnormally distended. No wall thickening or inflammatory changes. Appendix is normal. Vascular/Lymphatic: Aortic atherosclerosis. No enlarged abdominal or pelvic lymph nodes. Reproductive: Prostate is unremarkable. Other: No free air or free fluid. Abdominal wall musculature appears intact. Musculoskeletal: Postoperative changes with posterior fixation from L1-L3 crossing a compressed L2 vertebra. Degenerative changes throughout the lumbar spine. Degenerative changes in the hips. IMPRESSION: 1. Nonobstructing stone in the left kidney. 2. Mild bladder wall thickening with stranding around the ureters possibly indicating cystitis with pyelonephritis. No abscess. 3. Cholelithiasis without evidence of acute cholecystitis. 4. Old fracture deformity at L2 with posterior fixation. Electronically Signed   By: Burman Nieves M.D.   On: 04/29/2023 21:22   DG Chest 2 View Result Date: 04/29/2023 CLINICAL DATA:  Fever. EXAM: CHEST - 2 VIEW COMPARISON:  Chest radiograph dated January 10, 2023. FINDINGS: The heart size and mediastinal contours are within  normal limits. 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 No focal consolidation, pleural effusion, or pneumothorax. Remote right-sided rib fractures. No acute osseous abnormality. IMPRESSION: No acute cardiopulmonary findings. Electronically Signed   By: Hart Robinsons M.D.   On: 04/29/2023 16:58    I independently reviewed the above imaging studies.  Impression/Recommendation 1) Febrile UTI: Pt with resistant prior UTI now on Zosyn according to prior culture.  CT without concern  except for a full bladder but he has voided since then.  Diverticulum is not readily appreciated.  Agree with IV antibiotic therapy pending cultures with transition to appropriate culture specific antibiotics once culture returns.  Will monitor bladder PVRs right now with no clear indication for catheter based on CT. 2) AKI: Maybe prerenal based on recent anorexia.  Monitor with IV fluid hydration.  If not improving by tomorrow and if PVRs of concern, he is amenable to urethral catheter placement if needed.  Crecencio Mc 04/29/2023, 10:23 PM    Moody Bruins MD   CC: Dr. Cyndia Bent

## 2023-04-29 NOTE — ED Provider Notes (Signed)
[place] EMERGENCY DEPARTMENT AT [facility] Provider Note   CSN: [id number] Arrival date & time: 04/29/23  1425     History  Chief Complaint  Patient presents with   Fever   Urinary Frequency   Hematuria    Thomas [name] is a 89 y.o. male who presents with fevers, chills, myalgias, anorexia, cough and congestion for the past 3 days.  Was seen on 12/4 by his PCP, treated for UTI and pneumonia with Doxy x 1 week.  Was also given Paxlovid, despite negative COVID test.   With persistent urinary symptoms on 12/9 was given 2-week course of Macrobid. Symptoms resolved temporarily.  Currently he endorses some urgency and foul-smelling urine, cloudiness as well.  States he had a prostatectomy 2022. Since then has had similar urinary symptoms. Additionally has a history of diverticula sometimes requiring self cath.  Reports 1 episode of emesis yesterday morning following taking his pills.  Otherwise denies any abdominal pain or diarrhea.  No chest pain or shortness of breath.  Denies any flank pain.  Continues to have bowel movements.    Fever Urinary Frequency  Hematuria   Past Medical History:  Diagnosis Date   Aortic valve disorders    Arthritis    Atrial flutter (HCC) 06/18/2016   "AF or AFl; not sure which" (06/23/2016)   Basal cell carcinoma    "face, nose left shoulder, left arm" (06/19/2016)   Basal cell carcinoma 09/13/2020   right temple   BBB (bundle branch block)    hx right   Chronic back pain    "neck, thoracic, lower back" (06/19/2016)   Complete heart block (HCC) 06/2016   Dyspnea    GERD (gastroesophageal reflux disease)    Gout    Heart block    "I've had type I, II Wenke before now" (06/19/2016)   History of gout    History of hiatal hernia    "self dx'd" (06/19/2016)   Hyperlipidemia    Hypertension    Lyme disease    "dx'd by me 2003; cx's showed dx 08/2015"   Migraine    "3-4/year" (06/19/2016)   Presence of permanent cardiac pacemaker  06/19/2016   Pt had Lyme Disease heart block.   PVC's (premature ventricular contractions)    Renal cancer, left (HCC) 2006   S/P cryotherapy   Spinal stenosis    "cervical, 1 thoracic, lumbar" (06/19/2016)   Squamous carcinoma    "face, nose left shoulder, left arm" (06/19/2016)   Stroke [facility])    TIA (transient ischemic attack) 06/14/2016   "I'm not sure that's what it was" (06/25/2016)   Visit for monitoring Tikosyn therapy 09/09/2017       Home Medications Prior to Admission medications   Medication Sig Start Date End Date Taking? Authorizing Provider  acetaminophen (TYLENOL) 500 MG tablet Take 500 mg by mouth every 6 (six) hours as needed for moderate pain or mild pain.    [provider]  apixaban (ELIQUIS) 2.5 MG TABS tablet Take 1 tablet (2.5 mg total) by mouth 2 (two) times daily. 12/05/22   Duke Salvia, MD  atorvastatin (LIPITOR) 40 MG tablet Take 1 tablet (40 mg total) by mouth daily. 04/16/23   Duke Salvia, MD  buPROPion (WELLBUTRIN XL) 150 MG 24 hr tablet Take 150 mg by mouth daily. 10/29/22   [provider]  calcium carbonate (TUMS - DOSED IN MG ELEMENTAL CALCIUM) 500 MG chewable tablet Chew 1-2 tablets by mouth daily as needed for  indigestion.    [provider]  Coenzyme Q10 (COQ10) 100 MG CAPS Take 100 mg by mouth daily.    [provider]  colchicine 0.6 MG tablet 0.6 mg daily as needed (Gout). 04/23/22   [provider]  dofetilide (TIKOSYN) 125 MCG capsule Take 1 capsule (125 mcg total) by mouth 2 (two) times daily. 08/29/22   Sherie Don, NP  DULoxetine (CYMBALTA) 20 MG capsule Take 20 mg by mouth daily.    [provider]  febuxostat (ULORIC) 40 MG tablet Take 1 tablet (40 mg total) by mouth daily. 08/21/21   Bosie Clos, MD  furosemide (LASIX) 40 MG tablet Take one tablet by mouth every other day x 3 doses. Patient not taking: Reported on 04/21/2023 04/16/23   Duke Salvia, MD  gabapentin  (NEURONTIN) 300 MG capsule Take 300 mg by mouth at bedtime. 07/06/20   [provider]  hydrALAZINE (APRESOLINE) 25 MG tablet Take 25 mg by mouth as needed. 04/23/22   [provider]  methocarbamol (ROBAXIN) 500 MG tablet Take 500 mg by mouth every 6 (six) hours as needed. 02/12/23   [provider]  metoprolol succinate (TOPROL-XL) 50 MG 24 hr tablet TAKE 1 TABLET BY MOUTH DAILY WITH OR IMMEDIATELY FOLLOWING A MEAL 04/16/23   Duke Salvia, MD  metroNIDAZOLE (FLAGYL) 500 MG tablet TAKE 1 TABLET BY MOUTH TWICE DAILY ON SUNDAY AND MONDAY. 11/23/21   Bosie Clos, MD  NEEDLE, DISP, 18 G (BD DISP NEEDLES) 18G X 1-1/2" MISC 1 mg by Does not apply route every 14 (fourteen) days. 05/26/20   Michiel Cowboy A, PA-C  NEEDLE, DISP, 21 G (BD DISP NEEDLES) 21G X 1-1/2" MISC 1 mg by Does not apply route every 14 (fourteen) days. 05/26/20   McGowan, Carollee Herter A, PA-C  omeprazole (PRILOSEC) 20 MG capsule TAKE ONE CAPSULE BY MOUTH EVERY DAY FOR HEARTBURN TAKE 30 MINUTES BEFORE A MEAL 05/16/21   [provider]  sacubitril-valsartan (ENTRESTO) 24-26 MG Take 1 tablet by mouth 2 (two) times daily. 04/16/23   Duke Salvia, MD  Syringe, Disposable, (2-3CC SYRINGE) 3 ML MISC 1 mg by Does not apply route every 14 (fourteen) days. 05/26/20   Michiel Cowboy A, PA-C  terbinafine (LAMISIL) 250 MG tablet Take 1 tablet (250 mg total) by mouth daily. Take with food. 01/09/23   Deirdre Evener, MD  Testosterone 1.62 % GEL APPLY 2 PUMPS DAILY 03/09/23   Michiel Cowboy A, PA-C  testosterone cypionate (DEPOTESTOSTERONE CYPIONATE) 200 MG/ML injection Inject 1 mL (200 mg total) into the muscle every 28 (twenty-eight) days. 05/08/22   Michiel Cowboy A, PA-C      Allergies    Other, Iodinated contrast media, Iodine, and Penicillins    Review of Systems   Review of Systems  Constitutional:  Positive for fever.  Genitourinary:  Positive for frequency and hematuria.    Physical  Exam Updated Vital Signs BP (!) 125/54   Pulse (!) 59   Temp 97.8 F (36.6 C) (Oral)   Resp 18   Ht 5\' 5"  (1.651 m)   Wt 70.3 kg   SpO2 95%   BMI 25.79 kg/m  Physical Exam Vitals and nursing note reviewed.  Constitutional:      General: He is not in acute distress.    Appearance: He is well-developed.  HENT:     Head: Normocephalic and atraumatic.     Mouth/Throat:     Pharynx: No oropharyngeal exudate or posterior oropharyngeal  erythema.  Eyes:     Conjunctiva/sclera: Conjunctivae normal.  Cardiovascular:     Rate and Rhythm: Normal rate and regular rhythm.     Heart sounds: No murmur heard. Pulmonary:     Effort: Pulmonary effort is normal. No respiratory distress.     Breath sounds: Normal breath sounds.  Abdominal:     Palpations: Abdomen is soft.     Tenderness: There is no abdominal tenderness. There is no right CVA tenderness or left CVA tenderness.  Musculoskeletal:        General: No swelling.     Cervical back: Neck supple.  Skin:    General: Skin is warm and dry.     Capillary Refill: Capillary refill takes less than 2 seconds.  Neurological:     General: No focal deficit present.     Mental Status: He is alert and oriented to person, place, and time.  Psychiatric:        Mood and Affect: Mood normal.     ED Results / Procedures / Treatments   Labs (all labs ordered are listed, but only abnormal results are displayed) Labs Reviewed  URINALYSIS, ROUTINE W REFLEX MICROSCOPIC - Abnormal; Notable for the following components:      Result Value   Color, Urine AMBER (*)    APPearance CLOUDY (*)    Hgb urine dipstick MODERATE (*)    Protein, ur 100 (*)    Nitrite POSITIVE (*)    Leukocytes,Ua LARGE (*)    Bacteria, UA MANY (*)    Non Squamous Epithelial 0-5 (*)    All other components within normal limits  CBC WITH DIFFERENTIAL/PLATELET - Abnormal; Notable for the following components:   WBC 16.5 (*)    Neutro Abs 13.2 (*)    Monocytes Absolute 1.9  (*)    Abs Immature Granulocytes 0.08 (*)    All other components within normal limits  COMPREHENSIVE METABOLIC PANEL - Abnormal; Notable for the following components:   Sodium 134 (*)    Glucose, Bld 127 (*)    BUN 36 (*)    Creatinine, Ser 2.40 (*)    Calcium 8.6 (*)    GFR, Estimated 25 (*)    All other components within normal limits  RESP PANEL BY RT-PCR (RSV, FLU A&B, COVID)  [id number]  URINE CULTURE  CULTURE, BLOOD (ROUTINE X 2)  CULTURE, BLOOD (ROUTINE X 2)  CBC  BASIC METABOLIC PANEL  I-STAT CG4 LACTIC ACID, ED  I-STAT CG4 LACTIC ACID, ED    EKG None  Radiology DG Chest 2 View Result Date: 04/29/2023 CLINICAL DATA:  Fever. EXAM: CHEST - 2 VIEW COMPARISON:  Chest radiograph dated January 10, 2023. FINDINGS: The heart size and mediastinal contours are within normal limits. 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 No focal consolidation, pleural effusion, or pneumothorax. Remote right-sided rib fractures. No acute osseous abnormality. IMPRESSION: No acute cardiopulmonary findings. Electronically Signed   By: Hart Robinsons M.D.   On: 04/29/2023 16:58    Procedures Procedures    Medications Ordered in ED Medications  dofetilide (TIKOSYN) capsule 125 mcg (has no administration in time range)  febuxostat (ULORIC) tablet 40 mg (has no administration in time range)  atorvastatin (LIPITOR) tablet 40 mg (has no administration in time range)  metoprolol succinate (TOPROL-XL) 24 hr tablet 50 mg (has no administration in time range)  buPROPion (WELLBUTRIN XL) 24 hr tablet 150 mg (has no administration in time range)  DULoxetine (CYMBALTA) DR capsule 20 mg (has no administration in  time range)  pantoprazole (PROTONIX) EC tablet 40 mg (has no administration in time range)  gabapentin (NEURONTIN) capsule 300 mg (has no administration in time range)  piperacillin-tazobactam (ZOSYN) IVPB 2.25 g (has no administration in time range)  ondansetron (ZOFRAN) injection 4 mg (has no  administration in time range)  acetaminophen (TYLENOL) tablet 650 mg (has no administration in time range)  acetaminophen (TYLENOL) tablet 1,000 mg (1,000 mg Oral Given 04/29/23 1613)  sodium chloride 0.9 % bolus 1,000 mL (0 mLs Intravenous Stopped 04/29/23 1857)    ED Course/ Medical Decision Making/ A&P Clinical Course as of 04/29/23 2007  Tue Apr 29, 2023  1833 Lymphs Abs: 1.3 [JT]    Clinical Course User Index [JT] Halford Decamp, PA-C                                 Medical Decision Making  This patient presents to the ED with chief complaint(s) of fevers and chills.  The complaint involves an extensive differential diagnosis and also carries with it a high risk of complications and morbidity.   pertinent past medical history as listed in HPI  The differential diagnosis includes  Viral URI, pneumonia, UTI, pyelonephritis The initial plan is to  Will start with basic labs, urine, chest x-ray Additional history obtained: Additional history obtained from family Records reviewed Care Everywhere/External Records  Initial Assessment:   Patient is hemodynamically stable, afebrile, nontoxic-appearing presenting with fevers, chills, myalgias past 3 days.  Additionally endorsing mild cough and chronic urinary symptoms.   Independent ECG interpretation:  None  Independent labs interpretation:  The following labs were independently interpreted:  UA with moderate hemoglobin, positive nitrates, large leukocytes, many bacteria, some squams.  CBC with leukocytosis of 16.5, CMP   Independent visualization and interpretation of imaging: I independently visualized the following imaging with scope of interpretation limited to determining acute life threatening conditions related to emergency care: CXR, which revealed no acute cardiopulmonary disease, CT abdomen pelvis pending  Treatment and Reassessment: Patient given tylenol following first assessment for myalgias and subjective  fever With AKI on labs, patient given liter of normal saline  Consultations obtained:   Urology: 6:40 pm Dr. Jill Poling recommended inserting Foley catheter given history of diverticula with urinary retention, CT abd pelvis, admit to medicine, we will plan to consult on him  Hospitalist: Received return call from Dr. Cyndia Bent, [place] Coombe for admission   Disposition:   Patient admitted for AKI and UTI, CT abdomen pelvis pending at this time.  Discussed plan with patient and his family.  They are in agreement.  Social Determinants of Health:   none  This note was dictated with voice recognition software.  Despite best efforts at proofreading, errors may have occurred which can change the documentation meaning.          Final Clinical Impression(s) / ED Diagnoses Final diagnoses:  Acute cystitis with hematuria  AKI (acute kidney injury) [facility])    Rx / DC Orders ED Discharge Orders     None         Fabienne Bruns 04/29/23 2007    Tegeler, Canary Brim, MD 04/29/23 2246

## 2023-04-29 NOTE — ED Notes (Signed)
ED TO INPATIENT HANDOFF REPORT  Name/Age/Gender Thomas [name] 89 y.o. male  Code Status    Code Status Orders  (From admission, onward)           Start     Ordered   04/29/23 1953  Full code  Continuous       Question:  By:  Answer:  Consent: discussion documented in EHR   04/29/23 1953           Code Status History     Date Active Date Inactive Code Status Order ID Comments User Context   01/10/2023 1238 01/10/2023 2334 Full Code [id number]  Duke Salvia, MD Inpatient   11/25/2022 1349 11/25/2022 2157 Full Code [id number]  Iran Ouch, MD Inpatient   02/26/2021 1154 02/26/2021 1928 Full Code [id number]  Iran Ouch, MD Inpatient   05/04/2020 0342 05/11/2020 2145 Full Code [id number]  Berna Bue, MD ED   10/23/2017 [phone] 10/23/2017 1628 Full Code [id number]  Iran Ouch, MD Inpatient   09/09/2017 1406 09/15/2017 1653 Full Code [id number]  Sheilah Pigeon, PA-C Inpatient   06/19/2016 1716 06/20/2016 1314 Full Code [id number]  Duke Salvia, MD Inpatient   06/14/2016 1641 06/15/2016 1938 Full Code [id number]  Enid Baas, MD Inpatient       Home/SNF/Other Home  Chief Complaint AKI (acute kidney injury) (HCC) [N17.9]  Level of Care/Admitting Diagnosis ED Disposition     ED Disposition  Admit   Condition  --   Comment  Hospital Area: [facility] [[id number]]  Level of Care: Telemetry [5]  Admit to tele based on following criteria: Other see comments  Comments: rate  May place patient in observation at [facility] or [facility] if equivalent level of care is available:: No  Covid Evaluation: Asymptomatic - no recent exposure (last 10 days) testing not required  Diagnosis: AKI (acute kidney injury) [facility]) [[id number]]  Admitting Physician: Anselm Jungling [[id number]]  Attending Physician: Anselm Jungling [[id number]]          Medical History Past Medical History:  Diagnosis Date   Aortic valve disorders    Arthritis    Atrial flutter  (HCC) 06/18/2016   "AF or AFl; not sure which" (06/23/2016)   Basal cell carcinoma    "face, nose left shoulder, left arm" (06/19/2016)   Basal cell carcinoma 09/13/2020   right temple   BBB (bundle branch block)    hx right   Chronic back pain    "neck, thoracic, lower back" (06/19/2016)   Complete heart block (HCC) 06/2016   Dyspnea    GERD (gastroesophageal reflux disease)    Gout    Heart block    "I've had type I, II Wenke before now" (06/19/2016)   History of gout    History of hiatal hernia    "self dx'd" (06/19/2016)   Hyperlipidemia    Hypertension    Lyme disease    "dx'd by me 2003; cx's showed dx 08/2015"   Migraine    "3-4/year" (06/19/2016)   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   PVC's (premature ventricular contractions)    Renal cancer, left (HCC) 2006   S/P cryotherapy   Spinal stenosis    "cervical, 1 thoracic, lumbar" (06/19/2016)   Squamous carcinoma    "face, nose left shoulder, left arm" (06/19/2016)   Stroke (HCC)    TIA (transient ischemic attack) 06/14/2016   "I'm not sure that's  what it was" (06/25/2016)   Visit for monitoring Tikosyn therapy 09/09/2017    Allergies Allergies  Allergen Reactions   Other Other (See Comments)    Vicryl sutures - patient states that site becomes "soupy"   Iodinated Contrast Media Hives   Iodine Hives and Other (See Comments)    IVP contrast   Penicillins Itching, Rash and Other (See Comments)    ITCHY FEELING IN FINGERS Has patient had a PCN reaction causing immediate rash, facial/tongue/throat swelling, SOB or lightheadedness with hypotension: No Has patient had a PCN reaction causing severe rash involving mucus membranes or skin necrosis: No Has patient had a PCN reaction that required hospitalization No Has patient had a PCN reaction occurring within the last 10 years: No If all of the above answers are "NO", then may proceed with Cephalosporin use.     IV  Location/Drains/Wounds Patient Lines/Drains/Airways Status     Active Line/Drains/Airways     Name Placement date Placement time Site Days   Peripheral IV 04/29/23 Right Antecubital 04/29/23  1622  Antecubital  less than 1            Labs/Imaging Results for orders placed or performed during the hospital encounter of 04/29/23 (from the past 48 hours)  Urinalysis, Routine w reflex microscopic -Urine, Clean Catch     Status: Abnormal   Collection Time: 04/29/23  2:55 PM  Result Value Ref Range   Color, Urine AMBER (A) YELLOW    Comment: BIOCHEMICALS MAY BE AFFECTED BY COLOR   APPearance CLOUDY (A) CLEAR   Specific Gravity, Urine 1.012 1.005 - 1.030   pH 5.0 5.0 - 8.0   Glucose, UA NEGATIVE NEGATIVE mg/dL   Hgb urine dipstick MODERATE (A) NEGATIVE   Bilirubin Urine NEGATIVE NEGATIVE   Ketones, ur NEGATIVE NEGATIVE mg/dL   Protein, ur 578 (A) NEGATIVE mg/dL   Nitrite POSITIVE (A) NEGATIVE   Leukocytes,Ua LARGE (A) NEGATIVE   RBC / HPF 21-50 0 - 5 RBC/hpf   WBC, UA >50 0 - 5 WBC/hpf   Bacteria, UA MANY (A) NONE SEEN   Squamous Epithelial / HPF 0-5 0 - 5 /HPF   WBC Clumps PRESENT    Non Squamous Epithelial 0-5 (A) NONE SEEN    Comment: Performed at [facility], [address], [place], [place] [address]  CBC with Differential     Status: Abnormal   Collection Time: 04/29/23  3:48 PM  Result Value Ref Range   WBC 16.5 (H) 4.0 - 10.5 K/uL   RBC 4.93 4.22 - 5.81 MIL/uL   Hemoglobin 14.1 13.0 - 17.0 g/dL   HCT 95.2 84.1 - 32.4 %   MCV 90.3 80.0 - 100.0 fL   MCH 28.6 26.0 - 34.0 pg   MCHC 31.7 30.0 - 36.0 g/dL   RDW 40.1 02.7 - 25.3 %   Platelets 177 150 - 400 K/uL   nRBC 0.0 0.0 - 0.2 %   Neutrophils Relative % 79 %   Neutro Abs 13.2 (H) 1.7 - 7.7 K/uL   Lymphocytes Relative 8 %   Lymphs Abs 1.3 0.7 - 4.0 K/uL   Monocytes Relative 12 %   Monocytes Absolute 1.9 (H) 0.1 - 1.0 K/uL   Eosinophils Relative 0 %   Eosinophils Absolute 0.0 0.0 - 0.5 K/uL    Basophils Relative 0 %   Basophils Absolute 0.1 0.0 - 0.1 K/uL   Immature Granulocytes 1 %   Abs Immature Granulocytes 0.08 (H) 0.00 - 0.07 K/uL  Comment: Performed at [facility], [address], [place], [place] [address]  Comprehensive metabolic panel     Status: Abnormal   Collection Time: 04/29/23  3:48 PM  Result Value Ref Range   Sodium 134 (L) 135 - 145 mmol/L   Potassium 3.8 3.5 - 5.1 mmol/L   Chloride 99 98 - 111 mmol/L   CO2 28 22 - 32 mmol/L   Glucose, Bld 127 (H) 70 - 99 mg/dL    Comment: Glucose reference range applies only to samples taken after fasting for at least 8 hours.   BUN 36 (H) 8 - 23 mg/dL   Creatinine, Ser 6.04 (H) 0.61 - 1.24 mg/dL   Calcium 8.6 (L) 8.9 - 10.3 mg/dL   Total Protein 6.7 6.5 - 8.1 g/dL   Albumin 3.5 3.5 - 5.0 g/dL   AST 15 15 - 41 U/L   ALT 10 0 - 44 U/L   Alkaline Phosphatase 55 38 - 126 U/L   Total Bilirubin 0.8 <1.2 mg/dL   GFR, Estimated 25 (L) >60 mL/min    Comment: (NOTE) Calculated using the CKD-EPI Creatinine Equation (2021)    Anion gap 7 5 - 15    Comment: Performed at [facility], [address], [place], [place] [address]  Resp panel by RT-PCR (RSV, Flu A&B, Covid) Anterior Nasal Swab     Status: None   Collection Time: 04/29/23  3:48 PM   Specimen: Anterior Nasal Swab  Result Value Ref Range   SARS Coronavirus 2 by RT PCR NEGATIVE NEGATIVE    Comment: (NOTE) SARS-CoV-2 target nucleic acids are NOT DETECTED.  The SARS-CoV-2 RNA is generally detectable in upper respiratory specimens during the acute phase of infection. The lowest concentration of SARS-CoV-2 viral copies this assay can detect is 138 copies/mL. A negative result does not preclude SARS-Cov-2 infection and should not be used as the sole basis for treatment or other patient management decisions. A negative result may occur with  improper specimen collection/handling, submission of specimen other than nasopharyngeal  swab, presence of viral mutation(s) within the areas targeted by this assay, and inadequate number of viral copies(<138 copies/mL). A negative result must be combined with clinical observations, patient history, and epidemiological information. The expected result is Negative.  Fact Sheet for Patients:  BloggerCourse.com  Fact Sheet for Healthcare Providers:  SeriousBroker.it  This test is no t yet approved or cleared by the [place] FDA and  has been authorized for detection and/or diagnosis of SARS-CoV-2 by FDA under an Emergency Use Authorization (EUA). This EUA will remain  in effect (meaning this test can be used) for the duration of the COVID-19 declaration under Section 564(b)(1) of the Act, 21 U.S.C.section 360bbb-3(b)(1), unless the authorization is terminated  or revoked sooner.       Influenza A by PCR NEGATIVE NEGATIVE   Influenza B by PCR NEGATIVE NEGATIVE    Comment: (NOTE) The Xpert Xpress SARS-CoV-2/FLU/RSV plus assay is intended as an aid in the diagnosis of influenza from Nasopharyngeal swab specimens and should not be used as a sole basis for treatment. Nasal washings and aspirates are unacceptable for Xpert Xpress SARS-CoV-2/FLU/RSV testing.  Fact Sheet for Patients: BloggerCourse.com  Fact Sheet for Healthcare Providers: SeriousBroker.it  This test is not yet approved or cleared by the [place] FDA and has been authorized for detection and/or diagnosis of SARS-CoV-2 by FDA under an Emergency Use Authorization (EUA). This EUA will remain in effect (meaning this test can  be used) for the duration of the COVID-19 declaration under Section 564(b)(1) of the Act, 21 U.S.C. section 360bbb-3(b)(1), unless the authorization is terminated or revoked.     Resp Syncytial Virus by PCR NEGATIVE NEGATIVE    Comment: (NOTE) Fact Sheet for  Patients: BloggerCourse.com  Fact Sheet for Healthcare Providers: SeriousBroker.it  This test is not yet approved or cleared by the [place] FDA and has been authorized for detection and/or diagnosis of SARS-CoV-2 by FDA under an Emergency Use Authorization (EUA). This EUA will remain in effect (meaning this test can be used) for the duration of the COVID-19 declaration under Section 564(b)(1) of the Act, 21 U.S.C. section 360bbb-3(b)(1), unless the authorization is terminated or revoked.  Performed at [facility], [address] [place], [place] [address]   I-Stat CG4 Lactic Acid     Status: None   Collection Time: 04/29/23  4:15 PM  Result Value Ref Range   Lactic Acid, Venous 1.3 0.5 - 1.9 mmol/L  I-Stat CG4 Lactic Acid     Status: None   Collection Time: 04/29/23  5:56 PM  Result Value Ref Range   Lactic Acid, Venous 1.1 0.5 - 1.9 mmol/L   DG Chest 2 View Result Date: 04/29/2023 CLINICAL DATA:  Fever. EXAM: CHEST - 2 VIEW COMPARISON:  Chest radiograph dated January 10, 2023. FINDINGS: The heart size and mediastinal contours are within normal limits. 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 No focal consolidation, pleural effusion, or pneumothorax. Remote right-sided rib fractures. No acute osseous abnormality. IMPRESSION: No acute cardiopulmonary findings. Electronically Signed   By: Hart Robinsons M.D.   On: 04/29/2023 16:58    Pending Labs Unresulted Labs (From admission, onward)     Start     Ordered   04/30/23 0500  CBC  Tomorrow morning,   R        04/29/23 1953   04/30/23 0500  Basic metabolic panel  Tomorrow morning,   R        04/29/23 1953   04/29/23 1555  Urine Culture  Once,   URGENT       Question:  Indication  Answer:  Urgency/frequency   04/29/23 1554   04/29/23 1555  Blood culture (routine x 2)  BLOOD CULTURE X 2,   R (with STAT occurrences)      04/29/23 1554             Vitals/Pain Today's Vitals   04/29/23 1650 04/29/23 1715 04/29/23 1845 04/29/23 1949  BP:  (!) 87/61 (!) 125/54   Pulse:  63 (!) 59   Resp:  17 18   Temp:    97.8 F (36.6 C)  TempSrc:    Oral  SpO2:  93% 95%   Weight:      Height:      PainSc: 3        Isolation Precautions No active isolations  Medications Medications  dofetilide (TIKOSYN) capsule 125 mcg (has no administration in time range)  febuxostat (ULORIC) tablet 40 mg (has no administration in time range)  atorvastatin (LIPITOR) tablet 40 mg (has no administration in time range)  metoprolol succinate (TOPROL-XL) 24 hr tablet 50 mg (has no administration in time range)  buPROPion (WELLBUTRIN XL) 24 hr tablet 150 mg (has no administration in time range)  DULoxetine (CYMBALTA) DR capsule 20 mg (has no administration in time range)  pantoprazole (PROTONIX) EC tablet 40 mg (has no administration in time range)  gabapentin (NEURONTIN) capsule 300 mg (has no  administration in time range)  piperacillin-tazobactam (ZOSYN) IVPB 2.25 g (has no administration in time range)  ondansetron (ZOFRAN) injection 4 mg (has no administration in time range)  acetaminophen (TYLENOL) tablet 650 mg (has no administration in time range)  acetaminophen (TYLENOL) tablet 1,000 mg (1,000 mg Oral Given 04/29/23 1613)  sodium chloride 0.9 % bolus 1,000 mL (0 mLs Intravenous Stopped 04/29/23 1857)    Mobility walks

## 2023-04-29 NOTE — ED Triage Notes (Signed)
Pt arrived reporting hx of UTI. Pt has been on antibiotic for the last 2 weeks. Fever started again lastnight. Reports hematuria and slight urgency. Denies any other symptoms.

## 2023-04-29 NOTE — Assessment & Plan Note (Addendum)
-   Will continue Eliquis  -Continue metoprolol and Tikosyn

## 2023-04-29 NOTE — ED Notes (Signed)
Lab called to have culture added on

## 2023-04-29 NOTE — Assessment & Plan Note (Signed)
-   Holding Entresto due to AKI.  Continue home metoprolol.

## 2023-04-29 NOTE — H&P (Addendum)
History and Physical    Patient: Thomas [name] [id number] DOB: May 13, 1932 DOA: 04/29/2023 DOS: the patient was seen and examined on 04/29/2023 PCP: System, Provider Not In  Patient coming from: Home  Chief Complaint:  Chief Complaint  Patient presents with   Fever   Urinary Frequency   Hematuria   HPI: Thomas [name] is a 89 y.o. 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   Most recent UTI at the beginning of the month and was treated with course of doxycycline and macrobid. Last night developed fever up to 102F and has been feeling nausea with poor appetite. Has not been able to take most of his home medications for the last few days. Denies any abdominal pain or dysuria. Has hematuria. Mild lower back pain. No urinary retention.  No history of nephrolithiasis.  He has followed with urologist Dr. Laverle Patter in the past and was advised to present to [facility]. Pt is a retired Insurance underwriter himself.    On arrival to ED, he was afebrile, normotensive on room air.   UA was grossly positive with large leukocyte, positive nitrite many bacteria's.  CBC with leukocytosis of 16.5 K.  Lactate otherwise within normal limits.  BMP notable for AKI with creatinine of 2.40 from baseline of around 1.5.  CT abdomen pelvis imaging pending at the time of admission.  ED PA discussed with urologist Dr. Laverle Patter and he recommended admission with foley placement due to hx of bladder diverticulum.    Review of Systems: As mentioned in the history of present illness. All other systems reviewed and are negative. Past Medical History:  Diagnosis Date   Aortic valve disorders    Arthritis    Atrial flutter (HCC) 06/18/2016   "AF or AFl; not sure which" (06/23/2016)   Basal cell carcinoma    "face,  nose left shoulder, left arm" (06/19/2016)   Basal cell carcinoma 09/13/2020   right temple   BBB (bundle branch block)    hx right   Chronic back pain    "neck, thoracic, lower back" (06/19/2016)   Complete heart block (HCC) 06/2016   Dyspnea    GERD (gastroesophageal reflux disease)    Gout    Heart block    "I've had type I, II Wenke before now" (06/19/2016)   History of gout    History of hiatal hernia    "self dx'd" (06/19/2016)   Hyperlipidemia    Hypertension    Lyme disease    "dx'd by me 2003; cx's showed dx 08/2015"   Migraine    "3-4/year" (06/19/2016)   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   PVC's (premature ventricular contractions)    Renal cancer, left (HCC) 2006   S/P cryotherapy   Spinal stenosis    "cervical, 1 thoracic, lumbar" (06/19/2016)   Squamous carcinoma    "face, nose left shoulder, left arm" (06/19/2016)   Stroke [facility])    TIA (transient ischemic attack) 06/14/2016   "I'm not sure that's what it was" (06/25/2016)   Visit for monitoring Tikosyn therapy 09/09/2017   Past Surgical History:  Procedure Laterality Date   ANKLE FRACTURE SURGERY Right 1967   BACK SURGERY  05/07/2020   BASAL CELL CARCINOMA EXCISION     "face, nose left shoulder, left  arm"   BIOPSY PROSTATE  2001 & 2003   BIV UPGRADE N/A 01/10/2023   Procedure: BIV UPGRADE;  Surgeon: Duke Salvia, MD;  Location: [facility] INVASIVE CV LAB;  Service: Cardiovascular;  Laterality: N/A;   CARDIAC CATHETERIZATION  1990's   CARDIOVERSION N/A 09/11/2017   Procedure: CARDIOVERSION;  Surgeon: Lewayne Bunting, MD;  Location: [facility] ENDOSCOPY;  Service: Cardiovascular;  Laterality: N/A;   FRACTURE SURGERY     HOLEP-LASER ENUCLEATION OF THE PROSTATE WITH MORCELLATION N/A 07/10/2020   Procedure: HOLEP-LASER ENUCLEATION OF THE PROSTATE WITH MORCELLATION;  Surgeon: Vanna Scotland, MD;  Location: [facility] ORS;  Service: Urology;  Laterality: N/A;   INGUINAL HERNIA REPAIR  Left 2012   INSERT / REPLACE / REMOVE PACEMAKER  06/19/2016   LAPAROSCOPIC ABLATION RENAL MASS     LEFT HEART CATH AND CORONARY ANGIOGRAPHY Left 10/23/2017   Procedure: LEFT HEART CATH AND CORONARY ANGIOGRAPHY;  Surgeon: Iran Ouch, MD;  Location: [facility] INVASIVE CV LAB;  Service: Cardiovascular;  Laterality: Left;   LEFT HEART CATH AND CORONARY ANGIOGRAPHY Left 02/26/2021   Procedure: LEFT HEART CATH AND CORONARY ANGIOGRAPHY;  Surgeon: Iran Ouch, MD;  Location: [facility] INVASIVE CV LAB;  Service: Cardiovascular;  Laterality: Left;   PACEMAKER IMPLANT N/A 06/19/2016   Procedure: Pacemaker Implant;  Surgeon: Duke Salvia, MD;  Location: [facility] INVASIVE CV LAB;  Service: Cardiovascular;  Laterality: N/A;   pacemasker     PROSTATE SURGERY     RIGHT/LEFT HEART CATH AND CORONARY ANGIOGRAPHY Bilateral 11/25/2022   Procedure: RIGHT/LEFT HEART CATH AND CORONARY ANGIOGRAPHY;  Surgeon: Iran Ouch, MD;  Location: [facility] INVASIVE CV LAB;  Service: Cardiovascular;  Laterality: Bilateral;   SQUAMOUS CELL CARCINOMA EXCISION     "face, nose left shoulder, left arm"   TONSILLECTOMY AND ADENOIDECTOMY     Social History:  reports that he has never smoked. He has never used smokeless tobacco. He reports current alcohol use of about 1.0 standard drink of alcohol per week. He reports that he does not use drugs. Retired Insurance underwriter about 14 years ago.   Allergies  Allergen Reactions   Other Other (See Comments)    Vicryl sutures - patient states that site becomes "soupy"   Iodinated Contrast Media Hives   Iodine Hives and Other (See Comments)    IVP contrast   Penicillins Itching, Rash and Other (See Comments)    ITCHY FEELING IN FINGERS Has patient had a PCN reaction causing immediate rash, facial/tongue/throat swelling, SOB or lightheadedness with hypotension: No Has patient had a PCN reaction causing severe rash involving mucus membranes or skin necrosis: No Has patient had a PCN reaction that  required hospitalization No Has patient had a PCN reaction occurring within the last 10 years: No If all of the above answers are "NO", then may proceed with Cephalosporin use.     Family History  Problem Relation Age of Onset   Heart attack Brother    Stroke Brother    Aortic stenosis Mother    Arthritis Father     Prior to Admission medications   Medication Sig Start Date End Date Taking? Authorizing Provider  acetaminophen (TYLENOL) 500 MG tablet Take 500 mg by mouth every 6 (six) hours as needed for moderate pain or mild pain.    [provider]  apixaban (ELIQUIS) 2.5 MG TABS tablet Take 1 tablet (2.5 mg total) by mouth 2 (two) times daily. 12/05/22   Duke Salvia, MD  atorvastatin (LIPITOR) 40 MG tablet Take  1 tablet (40 mg total) by mouth daily. 04/16/23   Duke Salvia, MD  buPROPion (WELLBUTRIN XL) 150 MG 24 hr tablet Take 150 mg by mouth daily. 10/29/22   [provider]  calcium carbonate (TUMS - DOSED IN MG ELEMENTAL CALCIUM) 500 MG chewable tablet Chew 1-2 tablets by mouth daily as needed for indigestion.    [provider]  Coenzyme Q10 (COQ10) 100 MG CAPS Take 100 mg by mouth daily.    [provider]  colchicine 0.6 MG tablet 0.6 mg daily as needed (Gout). 04/23/22   [provider]  dofetilide (TIKOSYN) 125 MCG capsule Take 1 capsule (125 mcg total) by mouth 2 (two) times daily. 08/29/22   Sherie Don, NP  DULoxetine (CYMBALTA) 20 MG capsule Take 20 mg by mouth daily.    [provider]  febuxostat (ULORIC) 40 MG tablet Take 1 tablet (40 mg total) by mouth daily. 08/21/21   Bosie Clos, MD  furosemide (LASIX) 40 MG tablet Take one tablet by mouth every other day x 3 doses. Patient not taking: Reported on 04/21/2023 04/16/23   Duke Salvia, MD  gabapentin (NEURONTIN) 300 MG capsule Take 300 mg by mouth at bedtime. 07/06/20   [provider]  hydrALAZINE (APRESOLINE) 25 MG tablet Take 25 mg by mouth  as needed. 04/23/22   [provider]  methocarbamol (ROBAXIN) 500 MG tablet Take 500 mg by mouth every 6 (six) hours as needed. 02/12/23   [provider]  metoprolol succinate (TOPROL-XL) 50 MG 24 hr tablet TAKE 1 TABLET BY MOUTH DAILY WITH OR IMMEDIATELY FOLLOWING A MEAL 04/16/23   Duke Salvia, MD  metroNIDAZOLE (FLAGYL) 500 MG tablet TAKE 1 TABLET BY MOUTH TWICE DAILY ON SUNDAY AND MONDAY. 11/23/21   Bosie Clos, MD  NEEDLE, DISP, 18 G (BD DISP NEEDLES) 18G X 1-1/2" MISC 1 mg by Does not apply route every 14 (fourteen) days. 05/26/20   Michiel Cowboy A, PA-C  NEEDLE, DISP, 21 G (BD DISP NEEDLES) 21G X 1-1/2" MISC 1 mg by Does not apply route every 14 (fourteen) days. 05/26/20   McGowan, Carollee Herter A, PA-C  omeprazole (PRILOSEC) 20 MG capsule TAKE ONE CAPSULE BY MOUTH EVERY DAY FOR HEARTBURN TAKE 30 MINUTES BEFORE A MEAL 05/16/21   [provider]  sacubitril-valsartan (ENTRESTO) 24-26 MG Take 1 tablet by mouth 2 (two) times daily. 04/16/23   Duke Salvia, MD  Syringe, Disposable, (2-3CC SYRINGE) 3 ML MISC 1 mg by Does not apply route every 14 (fourteen) days. 05/26/20   Michiel Cowboy A, PA-C  terbinafine (LAMISIL) 250 MG tablet Take 1 tablet (250 mg total) by mouth daily. Take with food. 01/09/23   Deirdre Evener, MD  Testosterone 1.62 % GEL APPLY 2 PUMPS DAILY 03/09/23   Michiel Cowboy A, PA-C  testosterone cypionate (DEPOTESTOSTERONE CYPIONATE) 200 MG/ML injection Inject 1 mL (200 mg total) into the muscle every 28 (twenty-eight) days. 05/08/22   Harle Battiest, PA-C    Physical Exam: Vitals:   04/29/23 1715 04/29/23 1845 04/29/23 1949 04/29/23 2028  BP: (!) 87/61 (!) 125/54  (!) 118/59  Pulse: 63 (!) 59  63  Resp: 17 18  18   Temp:   97.8 F (36.6 C) 98.5 F (36.9 C)  TempSrc:   Oral Oral  SpO2: 93% 95%  97%  Weight:      Height:       Constitutional: NAD, calm, comfortable, nontoxic well-appearing elderly gentleman sitting upright in  bed Eyes: lids and conjunctivae normal ENMT: Mucous membranes are moist.  Neck: normal, supple Respiratory: clear to auscultation bilaterally, no wheezing, no crackles. Normal respiratory effort. No accessory muscle use.  Cardiovascular: Regular rate and rhythm, no murmurs / rubs / gallops. No extremity edema.  Abdomen: no tenderness, soft  musculoskeletal: no clubbing / cyanosis. No joint deformity upper and lower extremities. Normal muscle tone.  Skin: no rashes, lesions, ulcers. No induration Neurologic: CN 2-12 grossly intact.   Psychiatric: Normal judgment and insight. Alert and oriented x 3. Normal mood. Data Reviewed:  See HPI  Assessment and Plan: * AKI (acute kidney injury) (HCC) - Creatinine elevated to 2.40 from baseline of 1.4.  Likely pre-renal from decrease intake.  -Urology Dr. Laverle Patter was consulted and recommended monitoring of PVR with bladder with potential placement of Foley catheter.   -Has already been administered a liter of fluid in ED. Will keep on continuous gentle IV fluid overnight.  UTI (urinary tract infection) - Patient reports fever last night although was afebrile on arrival.  CBC with leukocytosis of 16.5 K.  If he develops fever overnight he would fit sepsis criteria.CT A/P demonstrated acute cystitis and potential pyelonephritis.  -Wife showed recent urine culture sensitivity from earlier this month.  In terms of IV medication, he is only sensitive to Zosyn and meropenem and meropenem.  Will start IV Zosyn with urine culture pending.  Chronic systolic heart failure (HCC) -Last reported echocardiogram in 10/2022 with EF of 30 to 35%, moderately to severe decreased LV function.  Mild LVH.  Indeterminate diastolic parameters.  No significant valvular abnormality. -Ordering Entresto due to AKI  Persistent atrial fibrillation (HCC) - Will continue Eliquis  -Continue metoprolol and Tikosyn  Complete heart block (HCC) S/p pacemaker  Hyperlipidemia -  Continue statin  HYPERTENSION, BENIGN - Holding Entresto due to AKI.  Continue home metoprolol.      Advance Care Planning:   Code Status: Full Code   Consults: Neurology Dr. Laverle Patter Family Communication: Wife and 1 other male family member at bedside  Severity of Illness: The appropriate patient status for this patient is OBSERVATION. Observation status is judged to be reasonable and necessary in order to provide the required intensity of service to ensure the patient's safety. The patient's presenting symptoms, physical exam findings, and initial radiographic and laboratory data in the context of their medical condition is felt to place them at decreased risk for further clinical deterioration. Furthermore, it is anticipated that the patient will be medically stable for discharge from the hospital within 2 midnights of admission.   Author: Anselm Jungling, DO 04/29/2023 10:38 PM  For on call review [email or web address].

## 2023-04-29 NOTE — Assessment & Plan Note (Signed)
Continue statin. 

## 2023-04-29 NOTE — Assessment & Plan Note (Signed)
S/p pacemaker

## 2023-04-29 NOTE — Assessment & Plan Note (Addendum)
-   Patient reports fever last night although was afebrile on arrival.  CBC with leukocytosis of 16.5 K.  If he develops fever overnight he would fit sepsis criteria.CT A/P demonstrated acute cystitis and potential pyelonephritis.  -Wife showed recent urine culture sensitivity from earlier this month.  In terms of IV medication, he is only sensitive to Zosyn and meropenem and meropenem.  Will start IV Zosyn with urine culture pending.

## 2023-04-29 NOTE — Assessment & Plan Note (Signed)
-  Last reported echocardiogram in 10/2022 with EF of 30 to 35%, moderately to severe decreased LV function.  Mild LVH.  Indeterminate diastolic parameters.  No significant valvular abnormality. -Ordering Entresto due to AKI

## 2023-04-30 DIAGNOSIS — N12 Tubulo-interstitial nephritis, not specified as acute or chronic: Secondary | ICD-10-CM | POA: Diagnosis present

## 2023-04-30 DIAGNOSIS — I4821 Permanent atrial fibrillation: Secondary | ICD-10-CM | POA: Diagnosis present

## 2023-04-30 DIAGNOSIS — E785 Hyperlipidemia, unspecified: Secondary | ICD-10-CM | POA: Diagnosis present

## 2023-04-30 DIAGNOSIS — Z1152 Encounter for screening for COVID-19: Secondary | ICD-10-CM | POA: Diagnosis not present

## 2023-04-30 DIAGNOSIS — R197 Diarrhea, unspecified: Secondary | ICD-10-CM | POA: Diagnosis not present

## 2023-04-30 DIAGNOSIS — K219 Gastro-esophageal reflux disease without esophagitis: Secondary | ICD-10-CM | POA: Diagnosis present

## 2023-04-30 DIAGNOSIS — E291 Testicular hypofunction: Secondary | ICD-10-CM | POA: Diagnosis present

## 2023-04-30 DIAGNOSIS — M549 Dorsalgia, unspecified: Secondary | ICD-10-CM | POA: Diagnosis present

## 2023-04-30 DIAGNOSIS — Z8249 Family history of ischemic heart disease and other diseases of the circulatory system: Secondary | ICD-10-CM | POA: Diagnosis not present

## 2023-04-30 DIAGNOSIS — I130 Hypertensive heart and chronic kidney disease with heart failure and stage 1 through stage 4 chronic kidney disease, or unspecified chronic kidney disease: Secondary | ICD-10-CM | POA: Diagnosis present

## 2023-04-30 DIAGNOSIS — N1832 Chronic kidney disease, stage 3b: Secondary | ICD-10-CM | POA: Diagnosis present

## 2023-04-30 DIAGNOSIS — F39 Unspecified mood [affective] disorder: Secondary | ICD-10-CM | POA: Diagnosis present

## 2023-04-30 DIAGNOSIS — R509 Fever, unspecified: Secondary | ICD-10-CM | POA: Diagnosis not present

## 2023-04-30 DIAGNOSIS — Z1612 Extended spectrum beta lactamase (ESBL) resistance: Secondary | ICD-10-CM | POA: Diagnosis not present

## 2023-04-30 DIAGNOSIS — I5022 Chronic systolic (congestive) heart failure: Secondary | ICD-10-CM | POA: Diagnosis present

## 2023-04-30 DIAGNOSIS — R7881 Bacteremia: Secondary | ICD-10-CM | POA: Diagnosis present

## 2023-04-30 DIAGNOSIS — M109 Gout, unspecified: Secondary | ICD-10-CM | POA: Diagnosis present

## 2023-04-30 DIAGNOSIS — N390 Urinary tract infection, site not specified: Secondary | ICD-10-CM | POA: Diagnosis not present

## 2023-04-30 DIAGNOSIS — G8929 Other chronic pain: Secondary | ICD-10-CM | POA: Diagnosis present

## 2023-04-30 DIAGNOSIS — G629 Polyneuropathy, unspecified: Secondary | ICD-10-CM | POA: Diagnosis present

## 2023-04-30 DIAGNOSIS — N320 Bladder-neck obstruction: Secondary | ICD-10-CM | POA: Diagnosis present

## 2023-04-30 DIAGNOSIS — R8281 Pyuria: Secondary | ICD-10-CM | POA: Diagnosis not present

## 2023-04-30 DIAGNOSIS — N179 Acute kidney failure, unspecified: Secondary | ICD-10-CM | POA: Diagnosis not present

## 2023-04-30 DIAGNOSIS — I442 Atrioventricular block, complete: Secondary | ICD-10-CM | POA: Diagnosis present

## 2023-04-30 DIAGNOSIS — N200 Calculus of kidney: Secondary | ICD-10-CM | POA: Diagnosis present

## 2023-04-30 DIAGNOSIS — B9629 Other Escherichia coli [E. coli] as the cause of diseases classified elsewhere: Secondary | ICD-10-CM | POA: Diagnosis not present

## 2023-04-30 DIAGNOSIS — N3001 Acute cystitis with hematuria: Secondary | ICD-10-CM | POA: Diagnosis present

## 2023-04-30 DIAGNOSIS — B9620 Unspecified Escherichia coli [E. coli] as the cause of diseases classified elsewhere: Secondary | ICD-10-CM | POA: Diagnosis present

## 2023-04-30 DIAGNOSIS — Z7901 Long term (current) use of anticoagulants: Secondary | ICD-10-CM | POA: Diagnosis not present

## 2023-04-30 LAB — BASIC METABOLIC PANEL
Anion gap: 8 (ref 5–15)
BUN: 34 mg/dL — ABNORMAL HIGH (ref 8–23)
CO2: 26 mmol/L (ref 22–32)
Calcium: 8.6 mg/dL — ABNORMAL LOW (ref 8.9–10.3)
Chloride: 103 mmol/L (ref 98–111)
Creatinine, Ser: 2.34 mg/dL — ABNORMAL HIGH (ref 0.61–1.24)
GFR, Estimated: 26 mL/min — ABNORMAL LOW (ref >60)
Glucose, Bld: 126 mg/dL — ABNORMAL HIGH (ref 70–99)
Potassium: 4.2 mmol/L (ref 3.5–5.1)
Sodium: 137 mmol/L (ref 135–145)

## 2023-04-30 LAB — BLOOD CULTURE ID PANEL (REFLEXED) - BCID2

## 2023-04-30 LAB — CBC
HCT: 41.7 % (ref 39.0–52.0)
Hemoglobin: 13.9 g/dL (ref 13.0–17.0)
MCH: 29.8 pg (ref 26.0–34.0)
MCHC: 33.3 g/dL (ref 30.0–36.0)
MCV: 89.5 fL (ref 80.0–100.0)
Platelets: 149 10*3/uL — ABNORMAL LOW (ref 150–400)
RBC: 4.66 MIL/uL (ref 4.22–5.81)
RDW: 14.6 % (ref 11.5–15.5)
WBC: 13.3 10*3/uL — ABNORMAL HIGH (ref 4.0–10.5)
nRBC: 0 % (ref 0.0–0.2)

## 2023-04-30 MED ORDER — LACTATED RINGERS IV SOLN: INTRAVENOUS | Status: AC

## 2023-04-30 MED ORDER — SODIUM CHLORIDE 0.9 % IV SOLN
1.0000 g | Freq: Two times a day (BID) | INTRAVENOUS | Status: AC
  Administered 2023-04-30 – 2023-05-03 (×7): 1 g via INTRAVENOUS
  Filled 2023-04-30 (×7): qty 20

## 2023-04-30 MED ORDER — ORAL CARE MOUTH RINSE: 15.0000 mL | OROMUCOSAL | Status: DC | PRN

## 2023-04-30 NOTE — Progress Notes (Signed)
During med pass this RN was made aware by patients wife, Kriste Basque that home Tikosyn 125 mg was administered to patient because it "needed to be given at 8 am".  This RN educated patient and wife that home medications need to be sent to pharmacy or taken home and cannot be self administered. Becky told this nurse "I am a nurse and he is a doctor" so it is okay, but she will take them home. MD & Charge RN notified. Pharmacy contacted to reschedule medication for 8 am.

## 2023-04-30 NOTE — Progress Notes (Addendum)
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  No abdominal pain or dysuria but had hematuria.  Patient follows up with Dr. Laverle Patter urology as outpatient and was advised to come to the ED for these symptoms.  Of note patient recently had UTI beginning of this month and was treated with course of doxycycline and Macrobid.  In the ED patient was afebrile.  CBC showed mild leukocytosis at 16.5.  COVID influenza and RSV was negative.  Lactate was within normal range.  Urinalysis showed a large leukocytes with nitrite.  Creatinine was noted to be at 2.4 from baseline 1.5.  CT scan of the abdomen and pelvis with nonobstructive stone in the left kidney, mild bladder wall thickening indicating cystitis with pyelonephritis. ED provider spoke with urologist Dr. Laverle Patter and he recommended admission due to hx of bladder diverticulum.   Assessment and Plan:  * AKI (acute kidney injury)  Likely prerenal from decreased oral intake volume depletion..  Creatinine had elevated to 2.40 from baseline of 1.4.  Received IV fluid in the ED.  Creatinine today at 2.3.  Patient is positive balance for 121 4 mL.  Had urine output of 400 mL.  As per urology plan to follow-up postvoid residual and placing Foley catheter if renal failure does not improve and concerns for retention.  Patient urinated around 400 mL this morning and had postvoid residual of 54 mL.  Still feels thirsty.  Will continue with Ringer lactate for 1 more day.  Follow urology recommendations.   UTI (urinary tract infection) Subjective  fever at home and had leukocytosis on presentation.  CT scan with possible cystitis potential pyelonephritis.  Previous urine culture only sensitive for Zosyn and meropenem.  At this time patient has been started on IV Zosyn.  Blood cultures negative in less than 12 hours.   urine culture pending.   Chronic systolic heart failure (HCC) -Last reported echocardiogram in 10/2022 with EF of 30 to 35%, moderately to severe decreased LV function.  Mild LVH.  Indeterminate diastolic parameters.  Holding Entresto due to AKI.  At present, appears to be compensated.    Persistent atrial fibrillation (HCC) Continue anticoagulation, metoprolol Tikosyn from home.  Controlled.  Complete heart block (HCC) S/p pacemaker   Hyperlipidemia  Continue statin   Essential hypertension.  Continue metoprolol.  Hold Entresto.    DVT prophylaxis: apixaban (ELIQUIS) tablet 2.5 mg Start: 04/29/23 2245 apixaban (ELIQUIS) tablet 2.5 mg   Code Status:     Code Status: Full Code  Disposition: Home likely in 1 to 2 days  Status is: Observation The patient will require care spanning > 2 midnights and should be moved to inpatient because: Acute kidney injury, pending clinical improvement, volume depletion, need for IV fluids,   Family Communication: Spoke with the patient's spouse at bedside.  Consultants:  Urology  Procedures:  None  Antimicrobials:  None  Anti-infectives (From admission, onward)    Start     Dose/Rate Route Frequency Ordered Stop   04/29/23 2200  piperacillin-tazobactam (ZOSYN) IVPB 2.25 g        2.25  g 100 mL/hr over 30 Minutes Intravenous Every 8 hours 04/29/23 1956          Subjective: Today, patient was seen and examined at bedside.  Patient states that he had some bowel movement denies any nausea vomiting fever or chills.  Was able to urinate few times but was not recorded.  Patient's wife at bedside.  Objective: Vitals:   04/30/23 0211 04/30/23 0421 04/30/23 0500 04/30/23  0733  BP:  (!) 125/57  (!) 152/62  Pulse:  60  72  Resp:  19 20 20   Temp: 98.4 F (36.9 C) (!) 97.2 F (36.2 C)  98.5 F (36.9 C)  TempSrc: Oral Oral  Oral  SpO2:  100%  92%  Weight:      Height:        Intake/Output Summary (Last 24 hours) at 04/30/2023 0935 Last data filed at 04/30/2023 0400 Gross per 24 hour  Intake 1614.49 ml  Output 400 ml  Net 1214.49 ml   Filed Weights   04/29/23 1429 04/29/23 2324  Weight: 70.3 kg 70 kg    Physical Examination: Body mass index is 25.68 kg/m.  General:  Average built, not in obvious distress, elderly male, Communicative, HENT:   No scleral pallor or icterus noted. Oral mucosa is moist.  Chest:  Clear breath sounds.   No crackles or wheezes.  CVS: S1 &S2 heard. No murmur.  Regular rate and rhythm. Abdomen: Soft, nontender, nondistended.  Bowel sounds are heard.   Extremities: No cyanosis, clubbing or edema.  Peripheral pulses are palpable. Psych: Alert, awake and oriented, normal mood CNS:  No cranial nerve deficits.  Power equal in all extremities.   Skin: Warm and dry.  No rashes noted.  Data Reviewed:   CBC: Recent Labs  Lab 04/29/23 1548 04/30/23 0533  WBC 16.5* 13.3*  NEUTROABS 13.2*  --   HGB 14.1 13.9  HCT 44.5 41.7  MCV 90.3 89.5  PLT 177 149*    Basic Metabolic Panel: Recent Labs  Lab 04/23/23 1217 04/29/23 1548 04/30/23 0533  NA 137 134* 137  K 4.3 3.8 4.2  CL 101 99 103  CO2 28 28 26   GLUCOSE 101* 127* 126*  BUN 32* 36* 34*  CREATININE 1.74* 2.40* 2.34*  CALCIUM 9.3 8.6* 8.6*  MG 2.5*  --   --     Liver Function Tests: Recent Labs  Lab 04/29/23 1548  AST 15  ALT 10  ALKPHOS 55  BILITOT 0.8  PROT 6.7  ALBUMIN 3.5     Radiology Studies: CT ABDOMEN PELVIS WO CONTRAST Result Date: 04/29/2023 CLINICAL DATA:  Pyelonephritis suspected. History of renal stone or obstruction. History of urinary tract infection on antibiotics for 2 weeks. Fever starting last night. Hematuria and urgency.  EXAM: CT ABDOMEN AND PELVIS WITHOUT CONTRAST TECHNIQUE: Multidetector CT imaging of the abdomen and pelvis was performed following the standard protocol without IV contrast. RADIATION DOSE REDUCTION: This exam was performed according to the departmental dose-optimization program which includes automated exposure control, adjustment of the mA and/or kV according to patient size and/or use of iterative reconstruction technique. COMPARISON:  CT abdomen 08/22/2022. CT chest abdomen and pelvis 05/04/2020. FINDINGS: Lower chest: Mild dependent atelectasis in the lungs. Hepatobiliary: No focal liver lesions. Tiny stones in the gallbladder. No inflammatory changes. No bile duct dilatation. Pancreas: Unremarkable. No pancreatic ductal dilatation or surrounding inflammatory changes. Spleen: Normal in size without focal abnormality. Adrenals/Urinary Tract: No adrenal gland nodules. Parenchymal calcification in the left kidney measuring  4 mm diameter. No change since prior study. No hydronephrosis or hydroureter. No ureteral stones or bladder stones. Mild bladder wall thickening with stranding around the ureters possibly indicating cystitis and pyelonephritis. No abscess. Stomach/Bowel: Stomach, small bowel, and colon are not abnormally distended. No wall thickening or inflammatory changes. Appendix is normal. Vascular/Lymphatic: Aortic atherosclerosis. No enlarged abdominal or pelvic lymph nodes. Reproductive: Prostate is unremarkable. Other: No free air or free fluid. Abdominal wall musculature appears intact. Musculoskeletal: Postoperative changes with posterior fixation from L1-L3 crossing a compressed L2 vertebra. Degenerative changes throughout the lumbar spine. Degenerative changes in the hips. IMPRESSION: 1. Nonobstructing stone in the left kidney. 2. Mild bladder wall thickening with stranding around the ureters possibly indicating cystitis with pyelonephritis. No abscess. 3. Cholelithiasis without evidence of acute  cholecystitis. 4. Old fracture deformity at L2 with posterior fixation. Electronically Signed   By: Burman Nieves M.D.   On: 04/29/2023 21:22   DG Chest 2 View Result Date: 04/29/2023 CLINICAL DATA:  Fever. EXAM: CHEST - 2 VIEW COMPARISON:  Chest radiograph dated January 10, 2023. FINDINGS: The heart size and mediastinal contours are within normal limits. 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 No focal consolidation, pleural effusion, or pneumothorax. Remote right-sided rib fractures. No acute osseous abnormality. IMPRESSION: No acute cardiopulmonary findings. Electronically Signed   By: Hart Robinsons M.D.   On: 04/29/2023 16:58      LOS: 0 days    Joycelyn Das, MD [facility] Available via Epic secure chat 7am-7pm After these hours, please refer to coverage provider listed on [email or web address] 04/30/2023, 9:35 AM

## 2023-04-30 NOTE — Progress Notes (Signed)
PHARMACY - PHYSICIAN COMMUNICATION CRITICAL VALUE ALERT - BLOOD CULTURE IDENTIFICATION (BCID)  Thomas [name] is an 89 y.o. male who presented to [facility] on 04/29/2023 with a chief complaint of fever.  Assessment:  Admit with prostatitis, incompletely treated UTI.  Previous urince cx with resistance. Blood cx now + GNR; BCID + ESBL Ecoli.  Meds adjusted for AKI.  Name of physician (or Provider) Contacted: L Pakhrel  Current antibiotics: Zosyn  Changes to prescribed antibiotics recommended:  Change to Meropenem 1gm IV q12h  Results for orders placed or performed during the hospital encounter of 04/29/23  Blood Culture ID Panel (Reflexed) (Collected: 04/29/2023  4:07 PM)  Result Value Ref Range   Enterococcus faecalis NOT DETECTED NOT DETECTED   Enterococcus Faecium NOT DETECTED NOT DETECTED   Listeria monocytogenes NOT DETECTED NOT DETECTED   Staphylococcus species NOT DETECTED NOT DETECTED   Staphylococcus aureus (BCID) NOT DETECTED NOT DETECTED   Staphylococcus epidermidis NOT DETECTED NOT DETECTED   Staphylococcus lugdunensis NOT DETECTED NOT DETECTED   Streptococcus species NOT DETECTED NOT DETECTED   Streptococcus agalactiae NOT DETECTED NOT DETECTED   Streptococcus pneumoniae NOT DETECTED NOT DETECTED   Streptococcus pyogenes NOT DETECTED NOT DETECTED   A.calcoaceticus-baumannii NOT DETECTED NOT DETECTED   Bacteroides fragilis NOT DETECTED NOT DETECTED   Enterobacterales DETECTED (A) NOT DETECTED   Enterobacter cloacae complex NOT DETECTED NOT DETECTED   Escherichia coli DETECTED (A) NOT DETECTED   Klebsiella aerogenes NOT DETECTED NOT DETECTED   Klebsiella oxytoca NOT DETECTED NOT DETECTED   Klebsiella pneumoniae NOT DETECTED NOT DETECTED   Proteus species NOT DETECTED NOT DETECTED   Salmonella species NOT DETECTED NOT DETECTED   Serratia marcescens NOT DETECTED NOT DETECTED   Haemophilus influenzae NOT DETECTED NOT DETECTED   Neisseria meningitidis NOT DETECTED NOT  DETECTED   Pseudomonas aeruginosa NOT DETECTED NOT DETECTED   Stenotrophomonas maltophilia NOT DETECTED NOT DETECTED   Candida albicans NOT DETECTED NOT DETECTED   Candida auris NOT DETECTED NOT DETECTED   Candida glabrata NOT DETECTED NOT DETECTED   Candida krusei NOT DETECTED NOT DETECTED   Candida parapsilosis NOT DETECTED NOT DETECTED   Candida tropicalis NOT DETECTED NOT DETECTED   Cryptococcus neoformans/gattii NOT DETECTED NOT DETECTED   CTX-M ESBL DETECTED (A) NOT DETECTED   Carbapenem resistance IMP NOT DETECTED NOT DETECTED   Carbapenem resistance KPC NOT DETECTED NOT DETECTED   Carbapenem resistance NDM NOT DETECTED NOT DETECTED   Carbapenem resist OXA 48 LIKE NOT DETECTED NOT DETECTED   Carbapenem resistance VIM NOT DETECTED NOT DETECTED    Junita Push PharmD 04/30/2023  12:04 PM

## 2023-04-30 NOTE — Plan of Care (Signed)

## 2023-04-30 NOTE — Consult Note (Signed)
Urology Inpatient Progress Report  Acute cystitis with hematuria [N30.01] AKI (acute kidney injury) (HCC) [N17.9]  Patient admitted for likely prostatitis.  He has what appears to be an incompletely treated urinary tract infection and fever.  Intv/Subj: No acute events overnight.  He is feeling much better.  He has been double voiding, and his last PVR when checked at 4 AM was 54 cc after voiding 400 cc. Patient is without complaint.  Principal Problem:   AKI (acute kidney injury) (HCC) Active Problems:   HYPERTENSION, BENIGN   Hyperlipidemia   Complete heart block (HCC)   Persistent atrial fibrillation (HCC)   Chronic systolic heart failure (HCC)   UTI (urinary tract infection)  Current Facility-Administered Medications  Medication Dose Route Frequency Provider Last Rate Last Admin   acetaminophen (TYLENOL) tablet 650 mg  650 mg Oral Q6H PRN Tu, Ching T, DO   650 mg at 04/29/23 2351   apixaban (ELIQUIS) tablet 2.5 mg  2.5 mg Oral BID Tu, Ching T, DO   2.5 mg at 04/30/23 1610   atorvastatin (LIPITOR) tablet 40 mg  40 mg Oral Daily Tu, Ching T, DO   40 mg at 04/30/23 9604   buPROPion (WELLBUTRIN XL) 24 hr tablet 150 mg  150 mg Oral Daily Tu, Ching T, DO   150 mg at 04/30/23 5409   dofetilide (TIKOSYN) capsule 125 mcg  125 mcg Oral BID Tu, Ching T, DO   125 mcg at 04/30/23 8119   DULoxetine (CYMBALTA) DR capsule 20 mg  20 mg Oral Daily Tu, Ching T, DO   20 mg at 04/30/23 1478   febuxostat (ULORIC) tablet 40 mg  40 mg Oral Daily Tu, Ching T, DO   40 mg at 04/30/23 2956   gabapentin (NEURONTIN) capsule 300 mg  300 mg Oral QHS Tu, Ching T, DO   300 mg at 04/29/23 2302   lactated ringers infusion   Intravenous Continuous Pokhrel, Laxman, MD       metoprolol succinate (TOPROL-XL) 24 hr tablet 50 mg  50 mg Oral Daily Tu, Ching T, DO   50 mg at 04/30/23 0938   ondansetron (ZOFRAN) injection 4 mg  4 mg Intravenous Q6H PRN Tu, Ching T, DO       Oral care mouth rinse  15 mL Mouth Rinse PRN Tu,  Ching T, DO       pantoprazole (PROTONIX) EC tablet 40 mg  40 mg Oral Daily Tu, Ching T, DO   40 mg at 04/30/23 0938   piperacillin-tazobactam (ZOSYN) IVPB 2.25 g  2.25 g Intravenous Q8H Pricilla Riffle, RPH   Stopped at 04/30/23 0458     Objective: Vital: Vitals:   04/30/23 0211 04/30/23 0421 04/30/23 0500 04/30/23 0733  BP:  (!) 125/57  (!) 152/62  Pulse:  60  72  Resp:  19 20 20   Temp: 98.4 F (36.9 C) (!) 97.2 F (36.2 C)  98.5 F (36.9 C)  TempSrc: Oral Oral  Oral  SpO2:  100%  92%  Weight:      Height:       I/Os: I/O last 3 completed shifts: In: 1614.5 [P.O.:300; I.V.:264.5; IV Piggyback:1050] Out: 400 [Urine:400]  Physical Exam:  General: Patient is in no apparent distress Lungs: Normal respiratory effort, chest expands symmetrically. GI: he abdomen is soft and nontender without mass. Ext: lower extremities symmetric  Lab Results: Recent Labs    04/29/23 1548 04/30/23 0533  WBC 16.5* 13.3*  HGB 14.1 13.9  HCT 44.5  41.7   Recent Labs    04/29/23 1548 04/30/23 0533  NA 134* 137  K 3.8 4.2  CL 99 103  CO2 28 26  GLUCOSE 127* 126*  BUN 36* 34*  CREATININE 2.40* 2.34*  CALCIUM 8.6* 8.6*   No results for input(s): "LABPT", "INR" in the last 72 hours. No results for input(s): "LABURIN" in the last 72 hours. Results for orders placed or performed during the hospital encounter of 04/29/23  Resp panel by RT-PCR (RSV, Flu A&B, Covid) Anterior Nasal Swab     Status: None   Collection Time: 04/29/23  3:48 PM   Specimen: Anterior Nasal Swab  Result Value Ref Range Status   SARS Coronavirus 2 by RT PCR NEGATIVE NEGATIVE Final    Comment: (NOTE) SARS-CoV-2 target nucleic acids are NOT DETECTED.  The SARS-CoV-2 RNA is generally detectable in upper respiratory specimens during the acute phase of infection. The lowest concentration of SARS-CoV-2 viral copies this assay can detect is 138 copies/mL. A negative result does not preclude SARS-Cov-2 infection  and should not be used as the sole basis for treatment or other patient management decisions. A negative result may occur with  improper specimen collection/handling, submission of specimen other than nasopharyngeal swab, presence of viral mutation(s) within the areas targeted by this assay, and inadequate number of viral copies(<138 copies/mL). A negative result must be combined with clinical observations, patient history, and epidemiological information. The expected result is Negative.  Fact Sheet for Patients:  BloggerCourse.com  Fact Sheet for Healthcare Providers:  SeriousBroker.it  This test is no t yet approved or cleared by the [place] FDA and  has been authorized for detection and/or diagnosis of SARS-CoV-2 by FDA under an Emergency Use Authorization (EUA). This EUA will remain  in effect (meaning this test can be used) for the duration of the COVID-19 declaration under Section 564(b)(1) of the Act, 21 U.S.C.section 360bbb-3(b)(1), unless the authorization is terminated  or revoked sooner.       Influenza A by PCR NEGATIVE NEGATIVE Final   Influenza B by PCR NEGATIVE NEGATIVE Final    Comment: (NOTE) The Xpert Xpress SARS-CoV-2/FLU/RSV plus assay is intended as an aid in the diagnosis of influenza from Nasopharyngeal swab specimens and should not be used as a sole basis for treatment. Nasal washings and aspirates are unacceptable for Xpert Xpress SARS-CoV-2/FLU/RSV testing.  Fact Sheet for Patients: BloggerCourse.com  Fact Sheet for Healthcare Providers: SeriousBroker.it  This test is not yet approved or cleared by the [place] FDA and has been authorized for detection and/or diagnosis of SARS-CoV-2 by FDA under an Emergency Use Authorization (EUA). This EUA will remain in effect (meaning this test can be used) for the duration of the COVID-19 declaration  under Section 564(b)(1) of the Act, 21 U.S.C. section 360bbb-3(b)(1), unless the authorization is terminated or revoked.     Resp Syncytial Virus by PCR NEGATIVE NEGATIVE Final    Comment: (NOTE) Fact Sheet for Patients: BloggerCourse.com  Fact Sheet for Healthcare Providers: SeriousBroker.it  This test is not yet approved or cleared by the [place] FDA and has been authorized for detection and/or diagnosis of SARS-CoV-2 by FDA under an Emergency Use Authorization (EUA). This EUA will remain in effect (meaning this test can be used) for the duration of the COVID-19 declaration under Section 564(b)(1) of the Act, 21 U.S.C. section 360bbb-3(b)(1), unless the authorization is terminated or revoked.  Performed at [facility], [address], [place], [place] [address]  Blood culture (routine x 2)     Status: None (Preliminary result)   Collection Time: 04/29/23  4:07 PM   Specimen: Right Antecubital; Blood  Result Value Ref Range Status   Specimen Description   Final    RIGHT ANTECUBITAL Performed at [facility], [address] [place], [place] [address]    Special Requests   Final    BOTTLES DRAWN AEROBIC AND ANAEROBIC Blood Culture results may not be optimal due to an inadequate volume of blood received in culture bottles Performed at [facility], [address] [place], [place] [address]    Culture  Setup Time   Final    GRAM NEGATIVE RODS IN BOTH AEROBIC AND ANAEROBIC BOTTLES Organism ID to follow Performed at [facility], [address] [place], [place] [address]    Culture GRAM NEGATIVE RODS  Final   Report Status PENDING  Incomplete  Blood culture (routine x 2)     Status: None (Preliminary result)   Collection Time: 04/29/23  4:07 PM   Specimen: Left Antecubital; Blood  Result Value Ref Range Status   Specimen Description   Final    LEFT  ANTECUBITAL Performed at [facility], [address] [place], [place] [address]    Special Requests   Final    BOTTLES DRAWN AEROBIC AND ANAEROBIC Blood Culture adequate volume Performed at [facility], [address], [place], [place] [address]    Culture   Final    NO GROWTH < 12 HOURS Performed at [facility], [address], [place], [place] [address]    Report Status PENDING  Incomplete    Studies/Results: CT ABDOMEN PELVIS WO CONTRAST Result Date: 04/29/2023 CLINICAL DATA:  Pyelonephritis suspected. History of renal stone or obstruction. History of urinary tract infection on antibiotics for 2 weeks. Fever starting last night. Hematuria and urgency. EXAM: CT ABDOMEN AND PELVIS WITHOUT CONTRAST TECHNIQUE: Multidetector CT imaging of the abdomen and pelvis was performed following the standard protocol without IV contrast. RADIATION DOSE REDUCTION: This exam was performed according to the departmental dose-optimization program which includes automated exposure control, adjustment of the mA and/or kV according to patient size and/or use of iterative reconstruction technique. COMPARISON:  CT abdomen 08/22/2022. CT chest abdomen and pelvis 05/04/2020. FINDINGS: Lower chest: Mild dependent atelectasis in the lungs. Hepatobiliary: No focal liver lesions. Tiny stones in the gallbladder. No inflammatory changes. No bile duct dilatation. Pancreas: Unremarkable. No pancreatic ductal dilatation or surrounding inflammatory changes. Spleen: Normal in size without focal abnormality. Adrenals/Urinary Tract: No adrenal gland nodules. Parenchymal calcification in the left kidney measuring 4 mm diameter. No change since prior study. No hydronephrosis or hydroureter. No ureteral stones or bladder stones. Mild bladder wall thickening with stranding around the ureters possibly indicating cystitis and pyelonephritis. No abscess. Stomach/Bowel: Stomach, small bowel, and  colon are not abnormally distended. No wall thickening or inflammatory changes. Appendix is normal. Vascular/Lymphatic: Aortic atherosclerosis. No enlarged abdominal or pelvic lymph nodes. Reproductive: Prostate is unremarkable. Other: No free air or free fluid. Abdominal wall musculature appears intact. Musculoskeletal: Postoperative changes with posterior fixation from L1-L3 crossing a compressed L2 vertebra. Degenerative changes throughout the lumbar spine. Degenerative changes in the hips. IMPRESSION: 1. Nonobstructing stone in the left kidney. 2. Mild bladder wall thickening with stranding around the ureters possibly indicating cystitis with pyelonephritis. No abscess. 3. Cholelithiasis without evidence of acute cholecystitis. 4. Old fracture deformity at L2 with posterior fixation.  Electronically Signed   By: Burman Nieves M.D.   On: 04/29/2023 21:22   DG Chest 2 View Result Date: 04/29/2023 CLINICAL DATA:  Fever. EXAM: CHEST - 2 VIEW COMPARISON:  Chest radiograph dated January 10, 2023. FINDINGS: The heart size and mediastinal contours are within normal limits. 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 No focal consolidation, pleural effusion, or pneumothorax. Remote right-sided rib fractures. No acute osseous abnormality. IMPRESSION: No acute cardiopulmonary findings. Electronically Signed   By: Hart Robinsons M.D.   On: 04/29/2023 16:58    Assessment: The patient has a febrile UTI, fortunately is not otherwise hemodynamically unstable.  I suspect that this is an incompletely treated UTI, and that ultimately he does not do a great job emptying his bladder.  The patient is now double voiding in the hospital and seems to be emptying his bladder here reasonably well.  With fluids and IV antibiotics he is feeling better.  His renal function has stabilized.  Hopefully in the coming days it will continue to improve.  Urine culture results are not returned.  Plan: I spoke to the patient and his wife  about his predicament.  We discussed the fact that his kidney function had plateaued, and that his PVR was relatively low this morning.  As result, I do not recommend that he have a Foley catheter, but do recommend that we trend his creatinine at least 1 more day.  As a pertains to the patient's infection, would not be unreasonable to put him on oral antibiotics based on his urine cultures from his most recent infection now and see how he does over the course of the next 24 hours.  If he continues to improve he might build to go home without the urine culture results, with close follow-up.  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  In addition another option might be to ask ID if they could do a urine PCR to get the bacteria and sensitivities much more quickly.   I am sending a message to his primary urologist at [facility], Dr. Apolinar Junes, so that he can have close follow-up.  He likely should be placed on some suppression antibiotics for at least the next 90 days.  Especially since he is planning to leave the country at the end of next month.  In addition, the patient would likely benefit from cathing once daily at night to ensure that he is emptying his bladder.  Again, the patient was onboard with this plan.  I will chart check tomorrow, and ensure that the patient is otherwise doing well.  If he needs to be seen, I will stop by again.  Again I spoke to him about that and he was more than understanding.    Berniece Salines, MD Urology 04/30/2023, 11:38 AM

## 2023-04-30 NOTE — Hospital Course (Addendum)
Thomas [name] is a 89 y.o. male, retired Insurance underwriter, with PMH of CHB s/p PPM, HTN, perm a.fib on Eliquis, CVA, chronic HFrEF, HLD, RCC, BPH SP TUMT presented with fever of 102 with nausea and decreased appetite.   Prior to admission, treated for UTI 12/24 with course of doxycycline and Macrobid. Admitted for AKI and ESBL E. coli pyelonephritis.  Assessment and Plan: AKI on CKD 3A. Baseline serum creatinine 1.5. On admission serum creatinine 2.4. Likely prerenal from decreased oral intake.  Treated with IV hydration with improvement to around 1.6. Outpatient referral to nephrology recommended for the patient for his CKD.   ESBL E. coli pyelonephritis Secondary to bladder outlet obstruction. Presented with fever and leukocytosis. CT scan shows evidence of cystitis and pyelonephritis. Urine culture grew ESBL E. coli as well as blood culture. To be cultures negative. Initially was on IV Zosyn.,  Now on IV Invanz. ID consulted, appreciate assistance.  Midline was placed on 12/28.  Consulted TOC for home antibiotic was placed on 12/29. Education will be arranged on 12/30.  Patient likely home on 12/30.   Chronic systolic heart failure Persistent A-fib Complete heart block Complete heart block S/p pacemaker HTN Last reported echocardiogram in 10/2022 with EF of 30 to 35%, moderately to severe decreased LV function.  Home regimen includes holding Entresto, Eliquis, Tikosyn, Toprol-XL 50 mg. Sherryll Burger is on hold due to AKI. At present, appears to be compensated and rate controlled.   Hyperlipidemia Continue statin   Mood disorder. On Cymbalta. Continuing. On Wellbutrin. Continue.  Neuropathy  on gabapentin. Continue.  GERD. On PPI.  Testosterone deficiency. On chronic injection as well as gel. Currently on hold.  Outpatient resumption.

## 2023-05-01 ENCOUNTER — Ambulatory Visit: Payer: Medicare Other | Admitting: Physical Therapy

## 2023-05-01 DIAGNOSIS — N179 Acute kidney failure, unspecified: Secondary | ICD-10-CM | POA: Diagnosis not present

## 2023-05-01 DIAGNOSIS — N390 Urinary tract infection, site not specified: Secondary | ICD-10-CM

## 2023-05-01 DIAGNOSIS — B9629 Other Escherichia coli [E. coli] as the cause of diseases classified elsewhere: Secondary | ICD-10-CM | POA: Diagnosis not present

## 2023-05-01 DIAGNOSIS — Z1612 Extended spectrum beta lactamase (ESBL) resistance: Secondary | ICD-10-CM | POA: Diagnosis not present

## 2023-05-01 LAB — CBC
HCT: 41 % (ref 39.0–52.0)
Hemoglobin: 13.4 g/dL (ref 13.0–17.0)
MCH: 28.9 pg (ref 26.0–34.0)
MCHC: 32.7 g/dL (ref 30.0–36.0)
MCV: 88.4 fL (ref 80.0–100.0)
Platelets: 123 K/uL — ABNORMAL LOW (ref 150–400)
RBC: 4.64 MIL/uL (ref 4.22–5.81)
RDW: 14.6 % (ref 11.5–15.5)
WBC: 10.6 K/uL — ABNORMAL HIGH (ref 4.0–10.5)
nRBC: 0 % (ref 0.0–0.2)

## 2023-05-01 LAB — BASIC METABOLIC PANEL WITH GFR
Anion gap: 5 (ref 5–15)
BUN: 29 mg/dL — ABNORMAL HIGH (ref 8–23)
CO2: 24 mmol/L (ref 22–32)
Calcium: 8 mg/dL — ABNORMAL LOW (ref 8.9–10.3)
Chloride: 104 mmol/L (ref 98–111)
Creatinine, Ser: 2.04 mg/dL — ABNORMAL HIGH (ref 0.61–1.24)
GFR, Estimated: 31 mL/min — ABNORMAL LOW (ref >60)
Glucose, Bld: 103 mg/dL — ABNORMAL HIGH (ref 70–99)
Potassium: 3.7 mmol/L (ref 3.5–5.1)
Sodium: 133 mmol/L — ABNORMAL LOW (ref 135–145)

## 2023-05-01 LAB — MAGNESIUM: Magnesium: 1.9 mg/dL (ref 1.7–2.4)

## 2023-05-01 MED ORDER — POTASSIUM CHLORIDE CRYS ER 20 MEQ PO TBCR
40.0000 meq | EXTENDED_RELEASE_TABLET | Freq: Once | ORAL | Status: AC
  Administered 2023-05-01: 40 meq via ORAL
  Filled 2023-05-01: qty 2

## 2023-05-01 MED ORDER — LOPERAMIDE HCL 2 MG PO CAPS
2.0000 mg | ORAL_CAPSULE | ORAL | Status: DC | PRN
  Administered 2023-05-01 – 2023-05-02 (×2): 2 mg via ORAL
  Filled 2023-05-01 (×2): qty 1

## 2023-05-01 NOTE — Progress Notes (Addendum)
PROGRESS NOTE    Thomas [name]  ZOX:[id number] DOB: Jan 24, 1933 DOA: 04/29/2023 PCP: System, Provider Not In    Brief Narrative:   Thomas [name] is a 89 y.o. 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  No abdominal pain or dysuria but had hematuria.  Patient follows up with Dr. Laverle Patter urology as outpatient and was advised to come to the ED for these symptoms.  Of note patient recently had UTI beginning of this month and was treated with course of doxycycline and Macrobid.  In the ED patient was afebrile.  CBC showed mild leukocytosis at 16.5.  COVID influenza and RSV was negative.  Lactate was within normal range.  Urinalysis showed a large leukocytes with nitrite.  Creatinine was noted to be at 2.4 from baseline 1.5.  CT scan of the abdomen and pelvis with nonobstructive stone in the left kidney, mild bladder wall thickening indicating cystitis with pyelonephritis. ED provider spoke with urologist Dr. Laverle Patter and he recommended admission due to hx of bladder diverticulum.   Assessment and Plan:  * AKI (acute kidney injury)  Likely prerenal from decreased oral intake volume depletion..  Creatinine had elevated to 2.40 from baseline of 1.4.  Received IV fluid and creatinine today at 2.0.  Slightly improved.  Patient is positive balance for 921 mL.  Urology following.  Will continue Ringer lactate infusion.    UTI (urinary tract infection) Subjective fever at home and had leukocytosis on presentation.  CT scan with possible cystitis, potential pyelonephritis.  Previous urine culture only sensitive for Zosyn and meropenem.  At this time patient was initially started on IV Zosyn.  Blood cultures and urine culture now positive for E.  coli.  Urine culture with ESBL E. coli.  Has been started on meropenem.   Await sensitivity.  Communicated with Dr Renold Don, infectious disease and depending upon sensitivity plan for antibiotics.  Possibly Cipro, if not sensitive, consider ertapenem on discharge.  Will likely need 2 weeks for prostatitis but if chronic prostatitis might need longer course.     Chronic systolic heart failure (HCC) -Last reported echocardiogram in 10/2022 with EF of 30 to 35%, moderately to severe decreased LV function.  Mild LVH.  Indeterminate diastolic parameters.  Holding Entresto due to AKI.  At present, appears to be compensated.    Persistent atrial fibrillation (HCC) Continue anticoagulation, metoprolol Tikosyn from home.  Controlled.  Aim. for potassium more than 4.  Complete heart block (HCC) S/p pacemaker   Hyperlipidemia  Continue statin   Essential hypertension.  Continue metoprolol.  Hold Entresto.    DVT prophylaxis: apixaban (ELIQUIS) tablet 2.5 mg Start: 04/29/23 2245 apixaban (ELIQUIS) tablet 2.5 mg   Code Status:     Code Status: Full Code  Disposition: Home likely in 1 to 2 days  Status is: Inpatient  The patient is  inpatient because: Acute kidney injury, pending clinical improvement, volume depletion, need for IV fluids, ESBL E. coli UTI   Family Communication: Spoke with the patient's spouse at bedside.  Consultants:  Urology ID verbal consult  Procedures:  None  Antimicrobials:  None  Anti-infectives (From admission, onward)    Start     Dose/Rate Route Frequency Ordered Stop   04/30/23 1400  meropenem (MERREM) 1 g in sodium  chloride 0.9 % 100 mL IVPB        1 g 200 mL/hr over 30 Minutes Intravenous Every 12 hours 04/30/23 1204     04/29/23 2200  piperacillin-tazobactam (ZOSYN) IVPB 2.25 g  Status:  Discontinued        2.25 g 100 mL/hr over 30 Minutes Intravenous Every 8 hours 04/29/23 1956 04/30/23 1204        Subjective: Today, patient was seen and examined at  bedside.  Patient stated that he feels better.  Denies any urinary urgency frequency dysuria had a bowel movement.  Starting to eat a little better.  Had low-grade fever.  Objective: Vitals:   04/30/23 2042 04/30/23 2316 05/01/23 0123 05/01/23 0534  BP: 136/60  (!) 152/100 (!) 160/68  Pulse: 62  88 61  Resp: 18  18 18   Temp: (!) 100.8 F (38.2 C) 98 F (36.7 C) 98.4 F (36.9 C) 98.2 F (36.8 C)  TempSrc: Oral Oral Oral Oral  SpO2: 96%  100% 96%  Weight:      Height:        Intake/Output Summary (Last 24 hours) at 05/01/2023 0944 Last data filed at 05/01/2023 0451 Gross per 24 hour  Intake 287.2 ml  Output 380 ml  Net -92.8 ml   Filed Weights   04/29/23 1429 04/29/23 2324  Weight: 70.3 kg 70 kg    Physical Examination: Body mass index is 25.68 kg/m.   General:  Average built, not in obvious distress, elderly male, Communicative, HENT:   No scleral pallor or icterus noted. Oral mucosa is moist.  Chest:  Clear breath sounds.   No crackles or wheezes.  CVS: S1 &S2 heard. No murmur.  Regular rate and rhythm. Abdomen: Soft, nontender, nondistended.  Bowel sounds are heard.   Extremities: No cyanosis, clubbing or edema.  Peripheral pulses are palpable. Psych: Alert, awake and oriented, normal mood CNS:  No cranial nerve deficits.  Power equal in all extremities.   Skin: Warm and dry.  No rashes noted.  Data Reviewed:   CBC: Recent Labs  Lab 04/29/23 1548 04/30/23 0533 05/01/23 0534  WBC 16.5* 13.3* 10.6*  NEUTROABS 13.2*  --   --   HGB 14.1 13.9 13.4  HCT 44.5 41.7 41.0  MCV 90.3 89.5 88.4  PLT 177 149* 123*    Basic Metabolic Panel: Recent Labs  Lab 04/29/23 1548 04/30/23 0533 05/01/23 0534  NA 134* 137 133*  K 3.8 4.2 3.7  CL 99 103 104  CO2 28 26 24   GLUCOSE 127* 126* 103*  BUN 36* 34* 29*  CREATININE 2.40* 2.34* 2.04*  CALCIUM 8.6* 8.6* 8.0*  MG  --   --  1.9    Liver Function Tests: Recent Labs  Lab 04/29/23 1548  AST 15  ALT 10   ALKPHOS 55  BILITOT 0.8  PROT 6.7  ALBUMIN 3.5     Radiology Studies: CT ABDOMEN PELVIS WO CONTRAST Result Date: 04/29/2023 CLINICAL DATA:  Pyelonephritis suspected. History of renal stone or obstruction. History of urinary tract infection on antibiotics for 2 weeks. Fever starting last night. Hematuria and urgency. EXAM: CT ABDOMEN AND PELVIS WITHOUT CONTRAST TECHNIQUE: Multidetector CT imaging of the abdomen and pelvis was performed following the standard protocol without IV contrast. RADIATION DOSE REDUCTION: This exam was performed according to the departmental dose-optimization program which includes automated exposure control, adjustment of the mA and/or kV according to patient size and/or use of iterative reconstruction technique. COMPARISON:  CT abdomen 08/22/2022. CT  chest abdomen and pelvis 05/04/2020. FINDINGS: Lower chest: Mild dependent atelectasis in the lungs. Hepatobiliary: No focal liver lesions. Tiny stones in the gallbladder. No inflammatory changes. No bile duct dilatation. Pancreas: Unremarkable. No pancreatic ductal dilatation or surrounding inflammatory changes. Spleen: Normal in size without focal abnormality. Adrenals/Urinary Tract: No adrenal gland nodules. Parenchymal calcification in the left kidney measuring 4 mm diameter. No change since prior study. No hydronephrosis or hydroureter. No ureteral stones or bladder stones. Mild bladder wall thickening with stranding around the ureters possibly indicating cystitis and pyelonephritis. No abscess. Stomach/Bowel: Stomach, small bowel, and colon are not abnormally distended. No wall thickening or inflammatory changes. Appendix is normal. Vascular/Lymphatic: Aortic atherosclerosis. No enlarged abdominal or pelvic lymph nodes. Reproductive: Prostate is unremarkable. Other: No free air or free fluid. Abdominal wall musculature appears intact. Musculoskeletal: Postoperative changes with posterior fixation from L1-L3 crossing a  compressed L2 vertebra. Degenerative changes throughout the lumbar spine. Degenerative changes in the hips. IMPRESSION: 1. Nonobstructing stone in the left kidney. 2. Mild bladder wall thickening with stranding around the ureters possibly indicating cystitis with pyelonephritis. No abscess. 3. Cholelithiasis without evidence of acute cholecystitis. 4. Old fracture deformity at L2 with posterior fixation. Electronically Signed   By: Burman Nieves M.D.   On: 04/29/2023 21:22   DG Chest 2 View Result Date: 04/29/2023 CLINICAL DATA:  Fever. EXAM: CHEST - 2 VIEW COMPARISON:  Chest radiograph dated January 10, 2023. FINDINGS: The heart size and mediastinal contours are within normal limits. 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 No focal consolidation, pleural effusion, or pneumothorax. Remote right-sided rib fractures. No acute osseous abnormality. IMPRESSION: No acute cardiopulmonary findings. Electronically Signed   By: Hart Robinsons M.D.   On: 04/29/2023 16:58      LOS: 1 day    Joycelyn Das, MD [facility] Available via Epic secure chat 7am-7pm After these hours, please refer to coverage provider listed on [email or web address] 05/01/2023, 9:44 AM

## 2023-05-01 NOTE — Consult Note (Signed)
[facility]    Date of Admission:  04/29/2023     Reason for Consult: esbl ecoli bacteremia    Referring Provider: Joycelyn Das     Lines:  Peripheral iv's  Abx: Anti-infectives (From admission, onward)    Start     Dose/Rate Route Frequency Ordered Stop   04/30/23 1400  meropenem (MERREM) 1 g in sodium chloride 0.9 % 100 mL IVPB        1 g 200 mL/hr over 30 Minutes Intravenous Every 12 hours 04/30/23 1204     04/29/23 2200  piperacillin-tazobactam (ZOSYN) IVPB 2.25 g  Status:  Discontinued        2.25 g 100 mL/hr over 30 Minutes Intravenous Every 8 hours 04/29/23 1956 04/30/23 1204              Assessment: 89 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 Pending susceptibility testing  Urology following and concerned for prostatitis  Clinically appear to be improving   Presence of pacemaker, and will repeat bcx but very low likelihood that the pacer would be seeded   He report 1 episode/first episode diarrhea today probably abx related and we can continue to monitor  Plan: Repeat bcx -- if positive would w/u for pacer infection Continue meropenem F/u susceptibility Pending repeat bcx result will decide on further abx course of action; planned 2 weeks total if negative repeat bcx for prostatitis/bacteremia Discussed with primary team     ------------------------------------------------ Principal Problem:   AKI (acute kidney injury) (HCC) Active Problems:   HYPERTENSION, BENIGN    Hyperlipidemia   Complete heart block (HCC)   Persistent atrial fibrillation (HCC)   Chronic systolic heart failure (HCC)   UTI (urinary tract infection)    HPI: Thomas [name] is a 89 y.o. 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 Sx was cloudy smelly urine  He continues to have same urine finding and also developed fever so presented to [place] 12/24  Febrile at home to 102s  Mild leukocytosis here and tmax 100.8 No hemodynamic instability  Lactate normal Ucx/bcx both growign ecoli  CT abdomen pelvis suggestive of ascending uti in setting ureteral stranding    Clinically improving and fever had resolved and wbc improving   First episode diarrhea today   He said he never had frequency/urgency/suprapubic tenderness, perianal pain  He has hx lower back injury and temporary urinary retention in the past but that had resolved. PVR here has been normal without suggestion of urinary retention  He has no recent within the past several weeks any cystoscopy     Family History  Problem Relation Age of Onset   Heart attack  Brother    Stroke Brother    Aortic stenosis Mother    Arthritis Father     Social History   Tobacco Use   Smoking status: Never   Smokeless tobacco: Never  Vaping Use   Vaping status: Never Used  Substance Use Topics   Alcohol use: Yes    Alcohol/week: 1.0 standard drink of alcohol    Types: 1 Glasses of wine per week   Drug use: No    Allergies  Allergen Reactions   Other Other (See Comments)    Vicryl sutures -  patient states that site becomes "soupy"   Iodinated Contrast Media Hives   Iodine Hives and Other (See Comments)    IVP contrast   Penicillins Itching, Rash and Other (See Comments)    ITCHY FEELING IN FINGERS; Tolerated Zosyn 04/2023    Review of Systems: ROS All Other ROS was negative, except mentioned above   Past Medical History:  Diagnosis Date   Aortic valve disorders    Arthritis    Atrial flutter (HCC) 06/18/2016   "AF or AFl; not sure which" (06/23/2016)   Basal cell carcinoma    "face, nose left shoulder, left arm" (06/19/2016)   Basal cell carcinoma 09/13/2020   right temple   BBB (bundle branch block)    hx right   Chronic back pain    "neck, thoracic, lower back" (06/19/2016)   Complete heart block (HCC) 06/2016   Dyspnea    GERD (gastroesophageal reflux disease)    Gout    Heart block    "I've had type I, II Wenke before now" (06/19/2016)   History of gout    History of hiatal hernia    "self dx'd" (06/19/2016)   Hyperlipidemia    Hypertension    Lyme disease    "dx'd by me 2003; cx's showed dx 08/2015"   Migraine    "3-4/year" (06/19/2016)   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   PVC's (premature ventricular contractions)    Renal cancer, left (HCC) 2006   S/P cryotherapy   Spinal stenosis    "cervical, 1 thoracic, lumbar" (06/19/2016)   Squamous carcinoma    "face, nose left shoulder, left arm" (06/19/2016)   Stroke (HCC)    TIA (transient ischemic attack) 06/14/2016   "I'm not sure that's what it was" (06/25/2016)   Visit for monitoring Tikosyn therapy 09/09/2017       Scheduled Meds:  apixaban  2.5 mg Oral BID   atorvastatin  40 mg Oral Daily   buPROPion  150 mg Oral Daily   dofetilide  125 mcg Oral BID   DULoxetine  20 mg Oral Daily   febuxostat  40 mg Oral Daily   gabapentin  300 mg Oral QHS   metoprolol succinate  50 mg Oral Daily   pantoprazole  40 mg Oral Daily   Continuous  Infusions:  meropenem (MERREM) IV 1 g (05/01/23 1421)   PRN Meds:.acetaminophen, ondansetron (ZOFRAN) IV, mouth rinse   OBJECTIVE: Blood pressure (!) 144/73, pulse (!) 59, temperature 98.2 F (36.8 C), temperature source Oral, resp. rate 19, height 5\' 5"  (1.651 m), weight 70 kg, SpO2 97%.  Physical Exam General/constitutional: no distress, pleasant HEENT: Normocephalic, PER, Conj Clear, EOMI, Oropharynx clear Neck supple CV: rrr no mrg- left sided chest cardiac device area no tenderness Lungs: clear to auscultation, normal respiratory effort Abd: Soft, Nontender Ext: no edema Skin: No Rash Neuro: nonfocal MSK: no peripheral  joint swelling/tenderness/warmth; back spines nontender     Lab Results Lab Results  Component Value Date   WBC 10.6 (H) 05/01/2023   HGB 13.4 05/01/2023   HCT 41.0 05/01/2023   MCV 88.4 05/01/2023   PLT 123 (L) 05/01/2023    Lab Results  Component Value Date   CREATININE 2.04 (H) 05/01/2023   BUN 29 (H) 05/01/2023   NA 133 (L) 05/01/2023   K 3.7 05/01/2023   CL 104 05/01/2023   CO2 24 05/01/2023    Lab Results  Component Value Date   ALT 10 04/29/2023   AST 15 04/29/2023   ALKPHOS 55 04/29/2023   BILITOT 0.8 04/29/2023      Microbiology: Recent Results (from the past 240 hours)  Urine Culture     Status: Abnormal (Preliminary result)   Collection Time: 04/29/23  2:55 PM   Specimen: Urine, Clean Catch  Result Value Ref Range Status   Specimen Description   Final    URINE, CLEAN CATCH Performed at [facility], [address], [place], [place] [address]    Special Requests   Final    NONE Performed at [facility], [address] [place], [place] [address]    Culture (A)  Final    >=100,000 COLONIES/mL ESCHERICHIA COLI SUSCEPTIBILITIES TO FOLLOW Performed at [facility], [address], [place], [place] [address]    Report Status PENDING  Incomplete  Resp panel by RT-PCR (RSV, Flu  A&B, Covid) Anterior Nasal Swab     Status: None   Collection Time: 04/29/23  3:48 PM   Specimen: Anterior Nasal Swab  Result Value Ref Range Status   SARS Coronavirus 2 by RT PCR NEGATIVE NEGATIVE Final    Comment: (NOTE) SARS-CoV-2 target nucleic acids are NOT DETECTED.  The SARS-CoV-2 RNA is generally detectable in upper respiratory specimens during the acute phase of infection. The lowest concentration of SARS-CoV-2 viral copies this assay can detect is 138 copies/mL. A negative result does not preclude SARS-Cov-2 infection and should not be used as the sole basis for treatment or other patient management decisions. A negative result may occur with  improper specimen collection/handling, submission of specimen other than nasopharyngeal swab, presence of viral mutation(s) within the areas targeted by this assay, and inadequate number of viral copies(<138 copies/mL). A negative result must be combined with clinical observations, patient history, and epidemiological information. The expected result is Negative.  Fact Sheet for Patients:  BloggerCourse.com  Fact Sheet for Healthcare Providers:  SeriousBroker.it  This test is no t yet approved or cleared by the [place] FDA and  has been authorized for detection and/or diagnosis of SARS-CoV-2 by FDA under an Emergency Use Authorization (EUA). This EUA will remain  in effect (meaning this test can be used) for the duration of the COVID-19 declaration under Section 564(b)(1) of the Act, 21 U.S.C.section 360bbb-3(b)(1), unless the authorization is terminated  or revoked sooner.       Influenza A by PCR NEGATIVE NEGATIVE Final   Influenza B by PCR NEGATIVE NEGATIVE Final    Comment: (NOTE) The Xpert Xpress SARS-CoV-2/FLU/RSV plus assay is intended as an aid in the diagnosis of influenza from Nasopharyngeal swab specimens and should not be used as a sole basis for treatment.  Nasal washings and aspirates are unacceptable for Xpert Xpress SARS-CoV-2/FLU/RSV testing.  Fact Sheet for Patients: BloggerCourse.com  Fact Sheet for Healthcare Providers: SeriousBroker.it  This test is not yet approved or cleared by the  [place] [employer] trader and has been authorized for detection and/or diagnosis of SARS-CoV-2 by FDA under an TEFL teacher (EUA). This EUA will remain in effect (meaning this test can be used) for the duration of the COVID-19 declaration under Section 564(b)(1) of the Act, 21 U.S.C. section 360bbb-3(b)(1), unless the authorization is terminated or revoked.     Resp Syncytial Virus by PCR NEGATIVE NEGATIVE Final    Comment: (NOTE) Fact Sheet for Patients: BloggerCourse.com  Fact Sheet for Healthcare Providers: SeriousBroker.it  This test is not yet approved or cleared by the [place] FDA and has been authorized for detection and/or diagnosis of SARS-CoV-2 by FDA under an Emergency Use Authorization (EUA). This EUA will remain in effect (meaning this test can be used) for the duration of the COVID-19 declaration under Section 564(b)(1) of the Act, 21 U.S.C. section 360bbb-3(b)(1), unless the authorization is terminated or revoked.  Performed at [facility], [address] [place], [place] [address]   Blood culture (routine x 2)     Status: Abnormal (Preliminary result)   Collection Time: 04/29/23  4:07 PM   Specimen: Right Antecubital; Blood  Result Value Ref Range Status   Specimen Description   Final    RIGHT ANTECUBITAL Performed at [facility], [address], [place], [place] [address]    Special Requests   Final    BOTTLES DRAWN AEROBIC AND ANAEROBIC Blood Culture results may not be optimal due to an inadequate volume of blood received in culture bottles Performed at [facility], [address], [place], [place] [address]    Culture  Setup Time   Final    GRAM NEGATIVE RODS IN BOTH AEROBIC AND ANAEROBIC BOTTLES CRITICAL RESULT CALLED TO, READ BACK BY AND VERIFIED WITH: PHARMD MICHELLE LILLISTON [id number] AT 1142 BY EC    Culture (A)  Final    ESCHERICHIA COLI SUSCEPTIBILITIES TO FOLLOW Performed at [facility], [address] [place], [place] [address]    Report Status PENDING  Incomplete  Blood culture (routine x 2)     Status: None (Preliminary result)   Collection Time: 04/29/23  4:07 PM   Specimen: Left Antecubital; Blood  Result Value Ref Range Status   Specimen Description   Final    LEFT ANTECUBITAL Performed at [facility] [place], [address], [place], [place] [address]    Special Requests   Final    BOTTLES DRAWN AEROBIC AND ANAEROBIC Blood Culture adequate volume Performed at [facility], [address] [place], [place] [address]    Culture  Setup Time   Final    GRAM NEGATIVE RODS ANAEROBIC BOTTLE ONLY CRITICAL VALUE NOTED.  VALUE IS CONSISTENT WITH PREVIOUSLY REPORTED AND CALLED VALUE. Performed at [facility], [address], [place], [place] [address]    Culture GRAM NEGATIVE RODS  Final   Report Status PENDING  Incomplete  Blood Culture ID Panel (Reflexed)     Status: Abnormal   Collection Time: 04/29/23  4:07 PM  Result Value Ref Range Status   Enterococcus faecalis NOT DETECTED NOT DETECTED Final   Enterococcus Faecium NOT DETECTED NOT DETECTED Final   Listeria monocytogenes NOT DETECTED NOT DETECTED Final   Staphylococcus species NOT DETECTED NOT DETECTED Final   Staphylococcus aureus (BCID) NOT DETECTED NOT DETECTED Final   Staphylococcus epidermidis NOT DETECTED NOT DETECTED Final   Staphylococcus lugdunensis NOT DETECTED NOT DETECTED Final   Streptococcus species NOT  DETECTED NOT DETECTED Final   Streptococcus agalactiae NOT DETECTED NOT DETECTED Final    Streptococcus pneumoniae NOT DETECTED NOT DETECTED Final   Streptococcus pyogenes NOT DETECTED NOT DETECTED Final   A.calcoaceticus-baumannii NOT DETECTED NOT DETECTED Final   Bacteroides fragilis NOT DETECTED NOT DETECTED Final   Enterobacterales DETECTED (A) NOT DETECTED Final    Comment: Enterobacterales represent a large order of gram negative bacteria, not a single organism. CRITICAL RESULT CALLED TO, READ BACK BY AND VERIFIED WITH: PHARMD MICHELLE LILLISTON [id number] AT 1142 BY EC    Enterobacter cloacae complex NOT DETECTED NOT DETECTED Final   Escherichia coli DETECTED (A) NOT DETECTED Final    Comment: CRITICAL RESULT CALLED TO, READ BACK BY AND VERIFIED WITH: PHARMD MICHELLE LILLISTON [id number] AT 1142 BY EC    Klebsiella aerogenes NOT DETECTED NOT DETECTED Final   Klebsiella oxytoca NOT DETECTED NOT DETECTED Final   Klebsiella pneumoniae NOT DETECTED NOT DETECTED Final   Proteus species NOT DETECTED NOT DETECTED Final   Salmonella species NOT DETECTED NOT DETECTED Final   Serratia marcescens NOT DETECTED NOT DETECTED Final   Haemophilus influenzae NOT DETECTED NOT DETECTED Final   Neisseria meningitidis NOT DETECTED NOT DETECTED Final   Pseudomonas aeruginosa NOT DETECTED NOT DETECTED Final   Stenotrophomonas maltophilia NOT DETECTED NOT DETECTED Final   Candida albicans NOT DETECTED NOT DETECTED Final   Candida auris NOT DETECTED NOT DETECTED Final   Candida glabrata NOT DETECTED NOT DETECTED Final   Candida krusei NOT DETECTED NOT DETECTED Final   Candida parapsilosis NOT DETECTED NOT DETECTED Final   Candida tropicalis NOT DETECTED NOT DETECTED Final   Cryptococcus neoformans/gattii NOT DETECTED NOT DETECTED Final   CTX-M ESBL DETECTED (A) NOT DETECTED Final    Comment: CRITICAL RESULT CALLED TO, READ BACK BY AND VERIFIED WITH: PHARMD MICHELLE LILLISTON [id number] AT 1142 BY EC (NOTE) Extended spectrum beta-lactamase detected. Recommend a carbapenem as initial  therapy.      Carbapenem resistance IMP NOT DETECTED NOT DETECTED Final   Carbapenem resistance KPC NOT DETECTED NOT DETECTED Final   Carbapenem resistance NDM NOT DETECTED NOT DETECTED Final   Carbapenem resist OXA 48 LIKE NOT DETECTED NOT DETECTED Final   Carbapenem resistance VIM NOT DETECTED NOT DETECTED Final    Comment: Performed at [facility], [address] [place], [place] [address]     Serology:    Imaging: If present, new imagings (plain films, ct scans, and mri) have been personally visualized and interpreted; radiology reports have been reviewed. Decision making incorporated into the Impression / Recommendations.  12/24 abd pelv ct 1. Nonobstructing stone in the left kidney. 2. Mild bladder wall thickening with stranding around the ureters possibly indicating cystitis with pyelonephritis. No abscess. 3. Cholelithiasis without evidence of acute cholecystitis. 4. Old fracture deformity at L2 with posterior fixation.  Raymondo Band, MD [facility] [phone] pager    05/01/2023, 5:05 PM

## 2023-05-01 NOTE — Progress Notes (Signed)
   05/01/23 1532  TOC Brief Assessment  Insurance and Status Reviewed  Patient has primary care physician Yes (PCP not listed will follow up with patient urologist Dr. Laverle Patter)  Home environment has been reviewed From home with spouse  Prior level of function: Independent  Prior/Current Home Services No current home services  Social Drivers of Health Review SDOH reviewed no interventions necessary  Readmission risk has been reviewed Yes  Transition of care needs no transition of care needs at this time

## 2023-05-01 NOTE — Consult Note (Signed)
Urology Inpatient Progress Report  Acute cystitis with hematuria [N30.01] AKI (acute kidney injury) (HCC) [N17.9]  Patient admitted for likely prostatitis.  He has what appears to be an incompletely treated urinary tract infection and fever.  Intv/Subj: No acute events overnight.   Blood cultures demonstrate E. coli in his bloodstream. Feeling better, appetite improved.  Afebrile  Principal Problem:   AKI (acute kidney injury) (HCC) Active Problems:   HYPERTENSION, BENIGN   Hyperlipidemia   Complete heart block (HCC)   Persistent atrial fibrillation (HCC)   Chronic systolic heart failure (HCC)   UTI (urinary tract infection)  Current Facility-Administered Medications  Medication Dose Route Frequency Provider Last Rate Last Admin   acetaminophen (TYLENOL) tablet 650 mg  650 mg Oral Q6H PRN Tu, Ching T, DO   650 mg at 04/30/23 2134   apixaban (ELIQUIS) tablet 2.5 mg  2.5 mg Oral BID Tu, Ching T, DO   2.5 mg at 05/01/23 0805   atorvastatin (LIPITOR) tablet 40 mg  40 mg Oral Daily Tu, Ching T, DO   40 mg at 05/01/23 1049   buPROPion (WELLBUTRIN XL) 24 hr tablet 150 mg  150 mg Oral Daily Tu, Ching T, DO   150 mg at 05/01/23 1049   dofetilide (TIKOSYN) capsule 125 mcg  125 mcg Oral BID Tu, Ching T, DO   125 mcg at 05/01/23 0802   DULoxetine (CYMBALTA) DR capsule 20 mg  20 mg Oral Daily Tu, Ching T, DO   20 mg at 05/01/23 1049   febuxostat (ULORIC) tablet 40 mg  40 mg Oral Daily Tu, Ching T, DO   40 mg at 05/01/23 0804   gabapentin (NEURONTIN) capsule 300 mg  300 mg Oral QHS Tu, Ching T, DO   300 mg at 04/30/23 2127   meropenem (MERREM) 1 g in sodium chloride 0.9 % 100 mL IVPB  1 g Intravenous Q12H Phylliss Blakes, RPH 200 mL/hr at 05/01/23 1421 1 g at 05/01/23 1421   metoprolol succinate (TOPROL-XL) 24 hr tablet 50 mg  50 mg Oral Daily Tu, Ching T, DO   50 mg at 05/01/23 0805   ondansetron (ZOFRAN) injection 4 mg  4 mg Intravenous Q6H PRN Tu, Ching T, DO       Oral care mouth rinse   15 mL Mouth Rinse PRN Tu, Ching T, DO       pantoprazole (PROTONIX) EC tablet 40 mg  40 mg Oral Daily Tu, Ching T, DO   40 mg at 05/01/23 1049     Objective: Vital: Vitals:   04/30/23 2316 05/01/23 0123 05/01/23 0534 05/01/23 1336  BP:  (!) 152/100 (!) 160/68 (!) 144/73  Pulse:  88 61 (!) 59  Resp:  18 18 19   Temp: 98 F (36.7 C) 98.4 F (36.9 C) 98.2 F (36.8 C) 98.2 F (36.8 C)  TempSrc: Oral Oral Oral Oral  SpO2:  100% 96% 97%  Weight:      Height:       I/Os: I/O last 3 completed shifts: In: 901.7 [P.O.:300; I.V.:351.7; IV Piggyback:250] Out: 980 [Urine:980]  Physical Exam:  General: Patient is in no apparent distress Lungs: Normal respiratory effort, chest expands symmetrically. GI: he abdomen is soft and nontender without mass. Ext: lower extremities symmetric  Lab Results: Recent Labs    04/29/23 1548 04/30/23 0533 05/01/23 0534  WBC 16.5* 13.3* 10.6*  HGB 14.1 13.9 13.4  HCT 44.5 41.7 41.0   Recent Labs    04/29/23 1548 04/30/23 0533  05/01/23 0534  NA 134* 137 133*  K 3.8 4.2 3.7  CL 99 103 104  CO2 28 26 24   GLUCOSE 127* 126* 103*  BUN 36* 34* 29*  CREATININE 2.40* 2.34* 2.04*  CALCIUM 8.6* 8.6* 8.0*   No results for input(s): "LABPT", "INR" in the last 72 hours. No results for input(s): "LABURIN" in the last 72 hours. Results for orders placed or performed during the hospital encounter of 04/29/23  Urine Culture     Status: Abnormal (Preliminary result)   Collection Time: 04/29/23  2:55 PM   Specimen: Urine, Clean Catch  Result Value Ref Range Status   Specimen Description   Final    URINE, CLEAN CATCH Performed at [facility], [address] [place], [place] [address]    Special Requests   Final    NONE Performed at [facility], [address] [place], [place] [address]    Culture (A)  Final    >=100,000 COLONIES/mL ESCHERICHIA COLI SUSCEPTIBILITIES TO FOLLOW Performed at [facility], [address], [place], [place] [address]    Report Status PENDING  Incomplete  Resp panel by RT-PCR (RSV, Flu A&B, Covid) Anterior Nasal Swab     Status: None   Collection Time: 04/29/23  3:48 PM   Specimen: Anterior Nasal Swab  Result Value Ref Range Status   SARS Coronavirus 2 by RT PCR NEGATIVE NEGATIVE Final    Comment: (NOTE) SARS-CoV-2 target nucleic acids are NOT DETECTED.  The SARS-CoV-2 RNA is generally detectable in upper respiratory specimens during the acute phase of infection. The lowest concentration of SARS-CoV-2 viral copies this assay can detect is 138 copies/mL. A negative result does not preclude SARS-Cov-2 infection and should not be used as the sole basis for treatment or other patient management decisions. A negative result may occur with  improper specimen collection/handling, submission of specimen other than nasopharyngeal swab, presence of viral mutation(s) within the areas targeted by this assay, and inadequate number of viral copies(<138 copies/mL). A negative result must be combined with clinical observations, patient history, and epidemiological information. The expected result is Negative.  Fact Sheet for Patients:  BloggerCourse.com  Fact Sheet for Healthcare Providers:  SeriousBroker.it  This test is no t yet approved or cleared by the [place] FDA and  has been authorized for detection and/or diagnosis of SARS-CoV-2 by FDA under an Emergency Use Authorization (EUA). This EUA will remain  in effect (meaning this test can be used) for the duration of the COVID-19 declaration under Section 564(b)(1) of the Act, 21 U.S.C.section 360bbb-3(b)(1), unless the authorization is terminated  or revoked sooner.       Influenza A by PCR NEGATIVE NEGATIVE Final   Influenza B by PCR NEGATIVE NEGATIVE Final    Comment: (NOTE) The Xpert Xpress SARS-CoV-2/FLU/RSV plus assay is intended as an  aid in the diagnosis of influenza from Nasopharyngeal swab specimens and should not be used as a sole basis for treatment. Nasal washings and aspirates are unacceptable for Xpert Xpress SARS-CoV-2/FLU/RSV testing.  Fact Sheet for Patients: BloggerCourse.com  Fact Sheet for Healthcare Providers: SeriousBroker.it  This test is not yet approved or cleared by the [place] FDA and has been authorized for detection and/or diagnosis of SARS-CoV-2 by FDA under an Emergency Use Authorization (EUA). This EUA will remain in effect (meaning this test can be used) for the duration of the COVID-19 declaration under Section 564(b)(1) of the Act, 21 U.S.C. section 360bbb-3(b)(1), unless  the authorization is terminated or revoked.     Resp Syncytial Virus by PCR NEGATIVE NEGATIVE Final    Comment: (NOTE) Fact Sheet for Patients: BloggerCourse.com  Fact Sheet for Healthcare Providers: SeriousBroker.it  This test is not yet approved or cleared by the [place] FDA and has been authorized for detection and/or diagnosis of SARS-CoV-2 by FDA under an Emergency Use Authorization (EUA). This EUA will remain in effect (meaning this test can be used) for the duration of the COVID-19 declaration under Section 564(b)(1) of the Act, 21 U.S.C. section 360bbb-3(b)(1), unless the authorization is terminated or revoked.  Performed at [facility], [address] [place], [place] [address]   Blood culture (routine x 2)     Status: Abnormal (Preliminary result)   Collection Time: 04/29/23  4:07 PM   Specimen: Right Antecubital; Blood  Result Value Ref Range Status   Specimen Description   Final    RIGHT ANTECUBITAL Performed at [facility], [address] [place], [place] [address]    Special Requests   Final    BOTTLES DRAWN AEROBIC AND ANAEROBIC Blood  Culture results may not be optimal due to an inadequate volume of blood received in culture bottles Performed at [facility], [address] [place], [place] [address]    Culture  Setup Time   Final    GRAM NEGATIVE RODS IN BOTH AEROBIC AND ANAEROBIC BOTTLES CRITICAL RESULT CALLED TO, READ BACK BY AND VERIFIED WITH: PHARMD MICHELLE LILLISTON [id number] AT 1142 BY EC    Culture (A)  Final    ESCHERICHIA COLI SUSCEPTIBILITIES TO FOLLOW Performed at [facility], [address], [place], [place] [address]    Report Status PENDING  Incomplete  Blood culture (routine x 2)     Status: None (Preliminary result)   Collection Time: 04/29/23  4:07 PM   Specimen: Left Antecubital; Blood  Result Value Ref Range Status   Specimen Description   Final    LEFT ANTECUBITAL Performed at [facility], [address] [place], [place] [address]    Special Requests   Final    BOTTLES DRAWN AEROBIC AND ANAEROBIC Blood Culture adequate volume Performed at [facility], [address] [place], [place] [address]    Culture  Setup Time   Final    GRAM NEGATIVE RODS ANAEROBIC BOTTLE ONLY CRITICAL VALUE NOTED.  VALUE IS CONSISTENT WITH PREVIOUSLY REPORTED AND CALLED VALUE. Performed at [facility], [address], [place], [place] [address]    Culture GRAM NEGATIVE RODS  Final   Report Status PENDING  Incomplete  Blood Culture ID Panel (Reflexed)     Status: Abnormal   Collection Time: 04/29/23  4:07 PM  Result Value Ref Range Status   Enterococcus faecalis NOT DETECTED NOT DETECTED Final   Enterococcus Faecium NOT DETECTED NOT DETECTED Final   Listeria monocytogenes NOT DETECTED NOT DETECTED Final   Staphylococcus species NOT DETECTED NOT DETECTED Final   Staphylococcus aureus (BCID) NOT DETECTED NOT DETECTED Final   Staphylococcus epidermidis NOT DETECTED NOT DETECTED Final   Staphylococcus lugdunensis NOT DETECTED NOT DETECTED  Final   Streptococcus species NOT DETECTED NOT DETECTED Final   Streptococcus agalactiae NOT DETECTED NOT DETECTED Final   Streptococcus pneumoniae NOT DETECTED NOT DETECTED Final   Streptococcus pyogenes NOT DETECTED NOT DETECTED Final   A.calcoaceticus-baumannii NOT DETECTED NOT DETECTED Final   Bacteroides fragilis NOT DETECTED NOT DETECTED Final  Enterobacterales DETECTED (A) NOT DETECTED Final    Comment: Enterobacterales represent a large order of gram negative bacteria, not a single organism. CRITICAL RESULT CALLED TO, READ BACK BY AND VERIFIED WITH: PHARMD MICHELLE LILLISTON [id number] AT 1142 BY EC    Enterobacter cloacae complex NOT DETECTED NOT DETECTED Final   Escherichia coli DETECTED (A) NOT DETECTED Final    Comment: CRITICAL RESULT CALLED TO, READ BACK BY AND VERIFIED WITH: PHARMD MICHELLE LILLISTON [id number] AT 1142 BY EC    Klebsiella aerogenes NOT DETECTED NOT DETECTED Final   Klebsiella oxytoca NOT DETECTED NOT DETECTED Final   Klebsiella pneumoniae NOT DETECTED NOT DETECTED Final   Proteus species NOT DETECTED NOT DETECTED Final   Salmonella species NOT DETECTED NOT DETECTED Final   Serratia marcescens NOT DETECTED NOT DETECTED Final   Haemophilus influenzae NOT DETECTED NOT DETECTED Final   Neisseria meningitidis NOT DETECTED NOT DETECTED Final   Pseudomonas aeruginosa NOT DETECTED NOT DETECTED Final   Stenotrophomonas maltophilia NOT DETECTED NOT DETECTED Final   Candida albicans NOT DETECTED NOT DETECTED Final   Candida auris NOT DETECTED NOT DETECTED Final   Candida glabrata NOT DETECTED NOT DETECTED Final   Candida krusei NOT DETECTED NOT DETECTED Final   Candida parapsilosis NOT DETECTED NOT DETECTED Final   Candida tropicalis NOT DETECTED NOT DETECTED Final   Cryptococcus neoformans/gattii NOT DETECTED NOT DETECTED Final   CTX-M ESBL DETECTED (A) NOT DETECTED Final    Comment: CRITICAL RESULT CALLED TO, READ BACK BY AND VERIFIED WITH: PHARMD MICHELLE  LILLISTON [id number] AT 1142 BY EC (NOTE) Extended spectrum beta-lactamase detected. Recommend a carbapenem as initial therapy.      Carbapenem resistance IMP NOT DETECTED NOT DETECTED Final   Carbapenem resistance KPC NOT DETECTED NOT DETECTED Final   Carbapenem resistance NDM NOT DETECTED NOT DETECTED Final   Carbapenem resist OXA 48 LIKE NOT DETECTED NOT DETECTED Final   Carbapenem resistance VIM NOT DETECTED NOT DETECTED Final    Comment: Performed at [facility], [address] [place], [place] [address]    Studies/Results: CT ABDOMEN PELVIS WO CONTRAST Result Date: 04/29/2023 CLINICAL DATA:  Pyelonephritis suspected. History of renal stone or obstruction. History of urinary tract infection on antibiotics for 2 weeks. Fever starting last night. Hematuria and urgency. EXAM: CT ABDOMEN AND PELVIS WITHOUT CONTRAST TECHNIQUE: Multidetector CT imaging of the abdomen and pelvis was performed following the standard protocol without IV contrast. RADIATION DOSE REDUCTION: This exam was performed according to the departmental dose-optimization program which includes automated exposure control, adjustment of the mA and/or kV according to patient size and/or use of iterative reconstruction technique. COMPARISON:  CT abdomen 08/22/2022. CT chest abdomen and pelvis 05/04/2020. FINDINGS: Lower chest: Mild dependent atelectasis in the lungs. Hepatobiliary: No focal liver lesions. Tiny stones in the gallbladder. No inflammatory changes. No bile duct dilatation. Pancreas: Unremarkable. No pancreatic ductal dilatation or surrounding inflammatory changes. Spleen: Normal in size without focal abnormality. Adrenals/Urinary Tract: No adrenal gland nodules. Parenchymal calcification in the left kidney measuring 4 mm diameter. No change since prior study. No hydronephrosis or hydroureter. No ureteral stones or bladder stones. Mild bladder wall thickening with stranding around the ureters possibly indicating  cystitis and pyelonephritis. No abscess. Stomach/Bowel: Stomach, small bowel, and colon are not abnormally distended. No wall thickening or inflammatory changes. Appendix is normal. Vascular/Lymphatic: Aortic atherosclerosis. No enlarged abdominal or pelvic lymph nodes. Reproductive: Prostate is unremarkable. Other: No free air or free fluid. Abdominal wall musculature appears intact. Musculoskeletal: Postoperative  changes with posterior fixation from L1-L3 crossing a compressed L2 vertebra. Degenerative changes throughout the lumbar spine. Degenerative changes in the hips. IMPRESSION: 1. Nonobstructing stone in the left kidney. 2. Mild bladder wall thickening with stranding around the ureters possibly indicating cystitis with pyelonephritis. No abscess. 3. Cholelithiasis without evidence of acute cholecystitis. 4. Old fracture deformity at L2 with posterior fixation. Electronically Signed   By: Burman Nieves M.D.   On: 04/29/2023 21:22    Assessment: The patient has a febrile UTI, fortunately is not otherwise hemodynamically unstable.  I suspect that this is an incompletely treated UTI, and that ultimately he does not do a great job emptying his bladder.    Bacteremia secondary to UTI.  Overall the patient is better.  Plan: Would suggest that the patient be on antibiotics for minimum of 14 days. Will have him follow-up closely with the urology group in [place] upon the discharge.    Berniece Salines, MD Urology 05/01/2023, 6:28 PM

## 2023-05-01 NOTE — Plan of Care (Signed)

## 2023-05-02 DIAGNOSIS — N179 Acute kidney failure, unspecified: Secondary | ICD-10-CM | POA: Diagnosis not present

## 2023-05-02 LAB — CBC
HCT: 39 % (ref 39.0–52.0)
Hemoglobin: 13 g/dL (ref 13.0–17.0)
MCH: 29 pg (ref 26.0–34.0)
MCHC: 33.3 g/dL (ref 30.0–36.0)
MCV: 87.1 fL (ref 80.0–100.0)
Platelets: 121 10*3/uL — ABNORMAL LOW (ref 150–400)
RBC: 4.48 MIL/uL (ref 4.22–5.81)
RDW: 14.6 % (ref 11.5–15.5)
WBC: 7.1 10*3/uL (ref 4.0–10.5)
nRBC: 0 % (ref 0.0–0.2)

## 2023-05-02 LAB — CULTURE, BLOOD (ROUTINE X 2)

## 2023-05-02 LAB — BASIC METABOLIC PANEL
Anion gap: 9 (ref 5–15)
BUN: 25 mg/dL — ABNORMAL HIGH (ref 8–23)
CO2: 22 mmol/L (ref 22–32)
Calcium: 8.1 mg/dL — ABNORMAL LOW (ref 8.9–10.3)
Chloride: 103 mmol/L (ref 98–111)
Creatinine, Ser: 1.51 mg/dL — ABNORMAL HIGH (ref 0.61–1.24)
GFR, Estimated: 44 mL/min — ABNORMAL LOW (ref >60)
Glucose, Bld: 94 mg/dL (ref 70–99)
Potassium: 4.6 mmol/L (ref 3.5–5.1)
Sodium: 134 mmol/L — ABNORMAL LOW (ref 135–145)

## 2023-05-02 LAB — URINE CULTURE: Culture: >=100000 — AB

## 2023-05-02 MED ORDER — SACCHAROMYCES BOULARDII 250 MG PO CAPS
250.0000 mg | ORAL_CAPSULE | Freq: Two times a day (BID) | ORAL | Status: DC
  Administered 2023-05-02 – 2023-05-05 (×6): 250 mg via ORAL
  Filled 2023-05-02 (×6): qty 1

## 2023-05-02 MED ORDER — SODIUM CHLORIDE 0.9 % IV SOLN
1.0000 g | INTRAVENOUS | Status: DC
  Administered 2023-05-04 – 2023-05-05 (×2): 1 g via INTRAVENOUS
  Filled 2023-05-02 (×2): qty 1000

## 2023-05-02 NOTE — Plan of Care (Signed)
  Problem: Activity: Goal: Risk for activity intolerance will decrease Outcome: Progressing   Problem: Education: Goal: Knowledge of General Education information will improve Description: Including pain rating scale, medication(s)/side effects and non-pharmacologic comfort measures Outcome: Adequate for Discharge   Problem: Nutrition: Goal: Adequate nutrition will be maintained Outcome: Adequate for Discharge

## 2023-05-02 NOTE — Plan of Care (Signed)
  Problem: Education: Goal: Knowledge of General Education information will improve Description: Including pain rating scale, medication(s)/side effects and non-pharmacologic comfort measures Outcome: Progressing   Problem: Clinical Measurements: Goal: Ability to maintain clinical measurements within normal limits will improve Outcome: Progressing Goal: Will remain free from infection Outcome: Progressing Goal: Diagnostic test results will improve Outcome: Progressing Goal: Respiratory complications will improve Outcome: Progressing Goal: Cardiovascular complication will be avoided Outcome: Progressing   Problem: Activity: Goal: Risk for activity intolerance will decrease Outcome: Progressing   Problem: Nutrition: Goal: Adequate nutrition will be maintained Outcome: Progressing   Problem: Pain Management: Goal: General experience of comfort will improve Outcome: Progressing   Problem: Safety: Goal: Ability to remain free from injury will improve Outcome: Progressing   Problem: Skin Integrity: Goal: Risk for impaired skin integrity will decrease Outcome: Progressing

## 2023-05-02 NOTE — Progress Notes (Addendum)
PROGRESS NOTE    Thomas [name]  NFA:[id number] DOB: [birth year] DOA: 04/29/2023 PCP: System, Provider Not In    Brief Narrative:   Thomas [name] is a 89 y.o. 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  No abdominal pain or dysuria but had hematuria.  Patient follows up with Dr. Laverle Patter urology as outpatient and was advised to come to the ED for these symptoms.  Of note patient recently had UTI beginning of this month and was treated with course of doxycycline and Macrobid.  In the ED, patient was afebrile.  CBC showed mild leukocytosis at 16.5.  COVID influenza and RSV was negative.  Lactate was within normal range.  Urinalysis showed a large leukocytes with nitrite.  Creatinine was noted to be at 2.4 from baseline 1.5.  CT scan of the abdomen and pelvis with nonobstructive stone in the left kidney, mild bladder wall thickening indicating cystitis with pyelonephritis. ED provider spoke with urologist Dr. Laverle Patter and he recommended admission due to hx of bladder diverticulum.   Assessment and Plan:  * AKI (acute kidney injury) on CKD stage IIIb. Likely prerenal from decreased oral intake volume depletion.  Creatinine had elevated to 2.40 from baseline of 1.4.  Received IV fluid and creatinine today at 1.5 and  improved.  Patient is positive balance for 911 mL.  Urology following.     E. coli UTI (urinary tract infection) .  CT scan with possible cystitis, potential pyelonephritis. Blood cultures and urine culture now positive for  ESBL E. coli.  Patient was on meropenem which has been changed to ertapenem at this time.  Will follow ID on board for further recommendations.  Repeat blood cultures have been sent on 05/01/2023  negative in less than 12 hours.  Chronic systolic heart failure (HCC) -Last reported echocardiogram in 10/2022 with EF of 30 to 35%, moderately to severe decreased LV function.  Mild LVH.  Indeterminate diastolic parameters.  Holding Entresto due to AKI.  At present, appears to be compensated.    Persistent atrial fibrillation (HCC) Continue anticoagulation, metoprolol Tikosyn from home.  Controlled.  Aim. for potassium more than 4.  Complete heart block (HCC) S/p pacemaker, ID on board, repeating blood cultures.  Cultures   Hyperlipidemia  Continue statin   Essential hypertension.  Continue metoprolol.  Hold Entresto.  Diarrhea could be antibiotic associated.  Will add probiotic.  On Imodium as needed.    DVT prophylaxis: apixaban (ELIQUIS) tablet 2.5 mg Start: 04/29/23 2245 apixaban (ELIQUIS) tablet 2.5 mg   Code Status:     Code Status: Full Code  Disposition: Home likely in 1 to 2 days, await ID, urology recommendations.  Status is: Inpatient  The patient is  inpatient because: ESBL infection, IV antibiotic,   Family Communication: I again spoke with the patient's spouse at bedside.  Consultants:  Urology ID   Procedures:  None  Antimicrobials:  Ertapenem/ meropenem  Anti-infectives (From admission, onward)    Start     Dose/Rate Route Frequency Ordered Stop   05/04/23 0600  ertapenem (INVANZ) 1 g in sodium chloride 0.9 % 100 mL IVPB        1 g 200 mL/hr over 30 Minutes Intravenous Every 24 hours 05/02/23 1402     04/30/23 1400  meropenem (MERREM) 1 g in sodium chloride 0.9 % 100 mL IVPB        1 g 200 mL/hr over 30 Minutes Intravenous Every 12 hours 04/30/23 1204 05/04/23 0359   04/29/23 2200  piperacillin-tazobactam (ZOSYN) IVPB 2.25 g  Status:  Discontinued        2.25 g 100 mL/hr over 30 Minutes Intravenous Every 8 hours 04/29/23 1956 04/30/23 1204      Subjective: Today, patient was seen and examined at bedside.  Patient states that he continues to  feel better.  Denies any fever, chills or rigor.  Patient's spouse at bedside.  Had multiple episodes of diarrhea and needed Imodium yesterday.  Has slowed down some today.   Objective: Vitals:   05/01/23 2205 05/02/23 0539 05/02/23 1001 05/02/23 1357  BP: (!) 153/63 (!) 148/70 (!) 131/54 (!) 146/66  Pulse: 61 (!) 59 60 (!) 59  Resp: 18 18  16   Temp: 98.6 F (37 C) 98.3 F (36.8 C)  98 F (36.7 C)  TempSrc: Oral Oral  Oral  SpO2: 99% 96%  97%  Weight:      Height:        Intake/Output Summary (Last 24 hours) at 05/02/2023 1423 Last data filed at 05/01/2023 1800 Gross per 24 hour  Intake 240 ml  Output --  Net 240 ml   Filed Weights   04/29/23 1429 04/29/23 2324  Weight: 70.3 kg 70 kg    Physical Examination: Body mass index is 25.68 kg/m.   General:  Average built, not in obvious distress, elderly male, Communicative, HENT:   No scleral pallor or icterus noted. Oral mucosa is moist.  Chest:  Clear breath sounds.   No crackles or wheezes.  CVS: S1 &S2 heard. No murmur.  Regular rate and rhythm.  Chest wall pacer in place Abdomen: Soft, nontender, nondistended.  Bowel sounds are heard.   Extremities: No cyanosis, clubbing or edema.  Peripheral pulses are palpable. Psych: Alert, awake and oriented, normal mood CNS:  No cranial nerve deficits.  Power equal in all extremities.   Skin: Warm and dry.  No rashes noted.  Data Reviewed:   CBC: Recent Labs  Lab 04/29/23 1548 04/30/23 0533 05/01/23 0534 05/02/23 0523  WBC 16.5* 13.3* 10.6* 7.1  NEUTROABS 13.2*  --   --   --   HGB 14.1 13.9 13.4 13.0  HCT 44.5 41.7 41.0 39.0  MCV 90.3 89.5 88.4 87.1  PLT 177 149* 123* 121*    Basic Metabolic Panel: Recent Labs  Lab 04/29/23 1548 04/30/23 0533 05/01/23 0534 05/02/23 0523  NA 134* 137 133* 134*  K 3.8 4.2 3.7 4.6  CL 99 103 104 103  CO2 28 26 24 22   GLUCOSE 127* 126* 103* 94  BUN 36* 34* 29* 25*  CREATININE 2.40* 2.34* 2.04* 1.51*  CALCIUM 8.6* 8.6* 8.0*  8.1*  MG  --   --  1.9  --     Liver Function Tests: Recent Labs  Lab 04/29/23 1548  AST 15  ALT 10  ALKPHOS 55  BILITOT 0.8  PROT 6.7  ALBUMIN 3.5     Radiology Studies: No results found.     LOS: 2 days    Joycelyn Das, MD [facility] Available via Epic secure chat 7am-7pm After these hours, please refer to coverage provider listed on [email or web address] 05/02/2023, 2:23 PM

## 2023-05-02 NOTE — Progress Notes (Signed)
Pharmacy Antibiotic Note  Thomas [name] is a 89 y.o. male admitted on 04/29/2023. Patient reported hematuria and urgency on admission. He noted being on antibiotics for the last two weeks PTA for UTI but fever returned night PTA. Patient with history of multi-drug resistant organism. He was originally given one dose in of Zosyn in the ED but BCID resulted as ESBL E Coli and patient was transitioned to [facility] therapy. Pharmacy has been consulted for Merrem dosing.  12/24 Bcx growing ESBL E Coli in 2/4 bottles (1 set) and 1/4 bottles growing GNRs. 12/24 Ucx now growing E Coli (suspect bacteremia is secondary to urinary source). ID and urology following. Urology suspects prostatitis and ID is suggesting a total of 2 weeks of therapy as long as repeat Bcx remain no growth--sensitivities pending.   Plan: -Continue Merrem 1g IV q12hrs -Continue to follow renal function, cultures and clinical progress for dose adjustments and de-escalation as indicated  Height: 5\' 5"  (165.1 cm) Weight: 70 kg (154 lb 5.2 oz) IBW/kg (Calculated) : 61.5  Temp (24hrs), Avg:98.4 F (36.9 C), Min:98.2 F (36.8 C), Max:98.6 F (37 C)  Recent Labs  Lab 04/29/23 1548 04/29/23 1615 04/29/23 1756 04/30/23 0533 05/01/23 0534 05/02/23 0523  WBC 16.5*  --   --  13.3* 10.6* 7.1  CREATININE 2.40*  --   --  2.34* 2.04* 1.51*  LATICACIDVEN  --  1.3 1.1  --   --   --     Estimated Creatinine Clearance: 29.4 mL/min (A) (by C-G formula based on SCr of 1.51 mg/dL (H)).    Allergies  Allergen Reactions   Other Other (See Comments)    Vicryl sutures - patient states that site becomes "soupy"   Iodinated Contrast Media Hives   Iodine Hives and Other (See Comments)    IVP contrast   Penicillins Itching, Rash and Other (See Comments)    ITCHY FEELING IN FINGERS; Tolerated Zosyn 04/2023    Antimicrobials this admission: 12/25 Merrem >> 12/25 Zosyn x1  Dose adjustments this admission:  NA  Microbiology results: 12/26  Bcx2: NG <12hrs 12/24 Bcx2: 2/4 in 1 set ESBL E Coli, 1/4 in other set GNR 12/24 UCx : > 100K E Coli   12/04 UCx: ESBL E.coli   Thank you for allowing pharmacy to be a part of this patient's care.  Cherylin Mylar, PharmD Clinical Pharmacist  12/27/20248:13 AM

## 2023-05-02 NOTE — Plan of Care (Signed)

## 2023-05-03 DIAGNOSIS — N179 Acute kidney failure, unspecified: Secondary | ICD-10-CM | POA: Diagnosis not present

## 2023-05-03 LAB — CULTURE, BLOOD (ROUTINE X 2): Special Requests: ADEQUATE

## 2023-05-03 LAB — CBC
HCT: 39.4 % (ref 39.0–52.0)
Hemoglobin: 12.8 g/dL — ABNORMAL LOW (ref 13.0–17.0)
MCH: 28.9 pg (ref 26.0–34.0)
MCHC: 32.5 g/dL (ref 30.0–36.0)
MCV: 88.9 fL (ref 80.0–100.0)
Platelets: 156 10*3/uL (ref 150–400)
RBC: 4.43 MIL/uL (ref 4.22–5.81)
RDW: 14.6 % (ref 11.5–15.5)
WBC: 6 10*3/uL (ref 4.0–10.5)
nRBC: 0 % (ref 0.0–0.2)

## 2023-05-03 LAB — BASIC METABOLIC PANEL
Anion gap: 6 (ref 5–15)
BUN: 24 mg/dL — ABNORMAL HIGH (ref 8–23)
CO2: 27 mmol/L (ref 22–32)
Calcium: 8.7 mg/dL — ABNORMAL LOW (ref 8.9–10.3)
Chloride: 103 mmol/L (ref 98–111)
Creatinine, Ser: 1.71 mg/dL — ABNORMAL HIGH (ref 0.61–1.24)
GFR, Estimated: 38 mL/min — ABNORMAL LOW (ref >60)
Glucose, Bld: 91 mg/dL (ref 70–99)
Potassium: 4.1 mmol/L (ref 3.5–5.1)
Sodium: 136 mmol/L (ref 135–145)

## 2023-05-03 LAB — MAGNESIUM: Magnesium: 2 mg/dL (ref 1.7–2.4)

## 2023-05-03 MED ORDER — SODIUM CHLORIDE 0.9% FLUSH: 10.0000 mL | INTRAVENOUS | Status: DC | PRN

## 2023-05-03 NOTE — Progress Notes (Signed)
Mobility Specialist - Progress Note   05/03/23 1200  Mobility  Activity Ambulated independently in hallway  Level of Assistance Independent  Assistive Device None  Distance Ambulated (ft) 500 ft  Activity Response Tolerated well  Mobility Referral Yes  Mobility visit 1 Mobility  Mobility Specialist Start Time (ACUTE ONLY) 1151  Mobility Specialist Stop Time (ACUTE ONLY) 1159  Mobility Specialist Time Calculation (min) (ACUTE ONLY) 8 min   Pt received in bed and agreeable to mobility. No complaints during session. Pt to bed after session with all needs met.    [facility]

## 2023-05-03 NOTE — Progress Notes (Signed)
PHARMACY NOTE -  Meropenem  Pharmacy has been assisting with dosing of meropenem for ESBL prostatitis, bacteremia. Dosage remains stable at 1g IV q12 hr and further renal adjustments per institutional Pharmacy antibiotic protocol Transitioning to ertapenem tomorrow in anticipation of going home on IV ertapenem (OPAT)  Pharmacy will sign off, following peripherally for culture results, dose adjustments, and length of therapy. Please reconsult if a change in clinical status warrants re-evaluation of dosage.  Bernadene Person, PharmD, BCPS [phone] 05/03/2023, 8:53 AM

## 2023-05-03 NOTE — Plan of Care (Signed)

## 2023-05-03 NOTE — Progress Notes (Signed)
ID brief note   Afebrile     Latest Ref Rng & Units 05/03/2023    6:03 AM 05/02/2023    5:23 AM 05/01/2023    5:34 AM  CBC  WBC 4.0 - 10.5 K/uL 6.0  7.1  10.6   Hemoglobin 13.0 - 17.0 g/dL 37.6  28.3  15.1   Hematocrit 39.0 - 52.0 % 39.4  39.0  41.0   Platelets 150 - 400 K/uL 156  121  123       Latest Ref Rng & Units 05/03/2023    6:03 AM 05/02/2023    5:23 AM 05/01/2023    5:34 AM  CMP  Glucose 70 - 99 mg/dL 91  94  761   BUN 8 - 23 mg/dL 24  25  29    Creatinine 0.61 - 1.24 mg/dL 6.07  3.71  0.62   Sodium 135 - 145 mmol/L 136  134  133   Potassium 3.5 - 5.1 mmol/L 4.1  4.6  3.7   Chloride 98 - 111 mmol/L 103  103  104   CO2 22 - 32 mmol/L 27  22  24    Calcium 8.9 - 10.3 mg/dL 8.7  8.1  8.0    Results for orders placed or performed during the hospital encounter of 04/29/23  Urine Culture     Status: Abnormal   Collection Time: 04/29/23  2:55 PM   Specimen: Urine, Clean Catch  Result Value Ref Range Status   Specimen Description   Final    URINE, CLEAN CATCH Performed at [facility], [address] [place], [place] [address]    Special Requests   Final    NONE Performed at [facility], [address], [place], [place] [address]    Culture (A)  Final    >=100,000 COLONIES/mL ESCHERICHIA COLI Confirmed Extended Spectrum Beta-Lactamase Producer (ESBL).  In bloodstream infections from ESBL organisms, carbapenems are preferred over piperacillin/tazobactam. They are shown to have a lower risk of mortality.    Report Status 05/02/2023 FINAL  Final   Organism ID, Bacteria ESCHERICHIA COLI (A)  Final      Susceptibility   Escherichia coli - MIC*    AMPICILLIN >=32 RESISTANT Resistant     CEFAZOLIN >=64 RESISTANT Resistant     CEFEPIME 2 SENSITIVE Sensitive     CEFTRIAXONE >=64 RESISTANT Resistant     CIPROFLOXACIN 0.5 INTERMEDIATE Intermediate     GENTAMICIN >=16 RESISTANT Resistant     IMIPENEM <=0.25 SENSITIVE Sensitive      NITROFURANTOIN <=16 SENSITIVE Sensitive     TRIMETH/SULFA >=320 RESISTANT Resistant     AMPICILLIN/SULBACTAM 16 INTERMEDIATE Intermediate     PIP/TAZO <=4 SENSITIVE Sensitive ug/mL    * >=100,000 COLONIES/mL ESCHERICHIA COLI  Resp panel by RT-PCR (RSV, Flu A&B, Covid) Anterior Nasal Swab     Status: None   Collection Time: 04/29/23  3:48 PM   Specimen: Anterior Nasal Swab  Result Value Ref Range Status   SARS Coronavirus 2 by RT PCR NEGATIVE NEGATIVE Final    Comment: (NOTE) SARS-CoV-2 target nucleic acids are NOT DETECTED.  The SARS-CoV-2 RNA is generally detectable in upper respiratory specimens during the acute phase of infection. The lowest concentration of SARS-CoV-2 viral copies this assay can detect is 138 copies/mL. A negative result does not preclude SARS-Cov-2 infection and should not be used as the sole basis for treatment or other patient management decisions. A negative result may occur with  improper specimen collection/handling, submission  of specimen other than nasopharyngeal swab, presence of viral mutation(s) within the areas targeted by this assay, and inadequate number of viral copies(<138 copies/mL). A negative result must be combined with clinical observations, patient history, and epidemiological information. The expected result is Negative.  Fact Sheet for Patients:  BloggerCourse.com  Fact Sheet for Healthcare Providers:  SeriousBroker.it  This test is no t yet approved or cleared by the [place] FDA and  has been authorized for detection and/or diagnosis of SARS-CoV-2 by FDA under an Emergency Use Authorization (EUA). This EUA will remain  in effect (meaning this test can be used) for the duration of the COVID-19 declaration under Section 564(b)(1) of the Act, 21 U.S.C.section 360bbb-3(b)(1), unless the authorization is terminated  or revoked sooner.       Influenza A by PCR NEGATIVE NEGATIVE  Final   Influenza B by PCR NEGATIVE NEGATIVE Final    Comment: (NOTE) The Xpert Xpress SARS-CoV-2/FLU/RSV plus assay is intended as an aid in the diagnosis of influenza from Nasopharyngeal swab specimens and should not be used as a sole basis for treatment. Nasal washings and aspirates are unacceptable for Xpert Xpress SARS-CoV-2/FLU/RSV testing.  Fact Sheet for Patients: BloggerCourse.com  Fact Sheet for Healthcare Providers: SeriousBroker.it  This test is not yet approved or cleared by the [place] FDA and has been authorized for detection and/or diagnosis of SARS-CoV-2 by FDA under an Emergency Use Authorization (EUA). This EUA will remain in effect (meaning this test can be used) for the duration of the COVID-19 declaration under Section 564(b)(1) of the Act, 21 U.S.C. section 360bbb-3(b)(1), unless the authorization is terminated or revoked.     Resp Syncytial Virus by PCR NEGATIVE NEGATIVE Final    Comment: (NOTE) Fact Sheet for Patients: BloggerCourse.com  Fact Sheet for Healthcare Providers: SeriousBroker.it  This test is not yet approved or cleared by the [place] FDA and has been authorized for detection and/or diagnosis of SARS-CoV-2 by FDA under an Emergency Use Authorization (EUA). This EUA will remain in effect (meaning this test can be used) for the duration of the COVID-19 declaration under Section 564(b)(1) of the Act, 21 U.S.C. section 360bbb-3(b)(1), unless the authorization is terminated or revoked.  Performed at [facility], [address] [place], [place] [address]   Blood culture (routine x 2)     Status: Abnormal   Collection Time: 04/29/23  4:07 PM   Specimen: Right Antecubital; Blood  Result Value Ref Range Status   Specimen Description   Final    RIGHT ANTECUBITAL Performed at [facility], [address], [place], [place] [address]    Special Requests   Final    BOTTLES DRAWN AEROBIC AND ANAEROBIC Blood Culture results may not be optimal due to an inadequate volume of blood received in culture bottles Performed at [facility], [address] [place] [address] [place], [place] [address]    Culture  Setup Time   Final    GRAM NEGATIVE RODS IN BOTH AEROBIC AND ANAEROBIC BOTTLES CRITICAL RESULT CALLED TO, READ BACK BY AND VERIFIED WITH: PHARMD MICHELLE LILLISTON [id number] AT 1142 BY EC Performed at [facility], [address] [place], [place] [address]    Culture (A)  Final    ESCHERICHIA COLI Confirmed Extended Spectrum Beta-Lactamase Producer (ESBL).  In bloodstream infections from ESBL organisms, carbapenems are preferred over piperacillin/tazobactam. They are shown to have a lower risk of mortality.    Report Status 05/02/2023 FINAL  Final  Organism ID, Bacteria ESCHERICHIA COLI  Final   Organism ID, Bacteria ESCHERICHIA COLI  Final      Susceptibility   Escherichia coli - KIRBY BAUER*    CEFAZOLIN RESISTANT Resistant    Escherichia coli - MIC*    AMPICILLIN >=32 RESISTANT Resistant     CEFEPIME 16 RESISTANT Resistant     CEFTAZIDIME RESISTANT Resistant     CEFTRIAXONE >=64 RESISTANT Resistant     CIPROFLOXACIN 0.5 INTERMEDIATE Intermediate     GENTAMICIN >=16 RESISTANT Resistant     IMIPENEM <=0.25 SENSITIVE Sensitive     TRIMETH/SULFA >=320 RESISTANT Resistant     AMPICILLIN/SULBACTAM 16 INTERMEDIATE Intermediate     PIP/TAZO <=4 SENSITIVE Sensitive ug/mL    * ESCHERICHIA COLI    ESCHERICHIA COLI  Blood culture (routine x 2)     Status: Abnormal   Collection Time: 04/29/23  4:07 PM   Specimen: Left Antecubital; Blood  Result Value Ref Range Status   Specimen Description   Final    LEFT ANTECUBITAL Performed at [facility], [address] [place], [place] [address]    Special Requests   Final    BOTTLES DRAWN AEROBIC AND  ANAEROBIC Blood Culture adequate volume Performed at [facility], [address], [place], [place] [address]    Culture  Setup Time   Final    GRAM NEGATIVE RODS ANAEROBIC BOTTLE ONLY CRITICAL VALUE NOTED.  VALUE IS CONSISTENT WITH PREVIOUSLY REPORTED AND CALLED VALUE.    Culture (A)  Final    ESCHERICHIA COLI SUSCEPTIBILITIES PERFORMED ON PREVIOUS CULTURE WITHIN THE LAST 5 DAYS. Performed at [facility], [address] [place], [place] [address]    Report Status 05/03/2023 FINAL  Final  Blood Culture ID Panel (Reflexed)     Status: Abnormal   Collection Time: 04/29/23  4:07 PM  Result Value Ref Range Status   Enterococcus faecalis NOT DETECTED NOT DETECTED Final   Enterococcus Faecium NOT DETECTED NOT DETECTED Final   Listeria monocytogenes NOT DETECTED NOT DETECTED Final   Staphylococcus species NOT DETECTED NOT DETECTED Final   Staphylococcus aureus (BCID) NOT DETECTED NOT DETECTED Final   Staphylococcus epidermidis NOT DETECTED NOT DETECTED Final   Staphylococcus lugdunensis NOT DETECTED NOT DETECTED Final   Streptococcus species NOT DETECTED NOT DETECTED Final   Streptococcus agalactiae NOT DETECTED NOT DETECTED Final   Streptococcus pneumoniae NOT DETECTED NOT DETECTED Final   Streptococcus pyogenes NOT DETECTED NOT DETECTED Final   A.calcoaceticus-baumannii NOT DETECTED NOT DETECTED Final   Bacteroides fragilis NOT DETECTED NOT DETECTED Final   Enterobacterales DETECTED (A) NOT DETECTED Final    Comment: Enterobacterales represent a large order of gram negative bacteria, not a single organism. CRITICAL RESULT CALLED TO, READ BACK BY AND VERIFIED WITH: PHARMD MICHELLE LILLISTON [id number] AT 1142 BY EC    Enterobacter cloacae complex NOT DETECTED NOT DETECTED Final   Escherichia coli DETECTED (A) NOT DETECTED Final    Comment: CRITICAL RESULT CALLED TO, READ BACK BY AND VERIFIED WITH: PHARMD MICHELLE LILLISTON [id number] AT 1142 BY EC     Klebsiella aerogenes NOT DETECTED NOT DETECTED Final   Klebsiella oxytoca NOT DETECTED NOT DETECTED Final   Klebsiella pneumoniae NOT DETECTED NOT DETECTED Final   Proteus species NOT DETECTED NOT DETECTED Final   Salmonella species NOT DETECTED NOT DETECTED Final   Serratia marcescens NOT DETECTED NOT DETECTED Final   Haemophilus influenzae NOT DETECTED NOT DETECTED Final   Neisseria meningitidis NOT DETECTED NOT DETECTED  Final   Pseudomonas aeruginosa NOT DETECTED NOT DETECTED Final   Stenotrophomonas maltophilia NOT DETECTED NOT DETECTED Final   Candida albicans NOT DETECTED NOT DETECTED Final   Candida auris NOT DETECTED NOT DETECTED Final   Candida glabrata NOT DETECTED NOT DETECTED Final   Candida krusei NOT DETECTED NOT DETECTED Final   Candida parapsilosis NOT DETECTED NOT DETECTED Final   Candida tropicalis NOT DETECTED NOT DETECTED Final   Cryptococcus neoformans/gattii NOT DETECTED NOT DETECTED Final   CTX-M ESBL DETECTED (A) NOT DETECTED Final    Comment: CRITICAL RESULT CALLED TO, READ BACK BY AND VERIFIED WITH: PHARMD MICHELLE LILLISTON [id number] AT 1142 BY EC (NOTE) Extended spectrum beta-lactamase detected. Recommend a carbapenem as initial therapy.      Carbapenem resistance IMP NOT DETECTED NOT DETECTED Final   Carbapenem resistance KPC NOT DETECTED NOT DETECTED Final   Carbapenem resistance NDM NOT DETECTED NOT DETECTED Final   Carbapenem resist OXA 48 LIKE NOT DETECTED NOT DETECTED Final   Carbapenem resistance VIM NOT DETECTED NOT DETECTED Final    Comment: Performed at [facility], [address] [place], [place] [address]  Culture, blood (Routine X 2) w Reflex to ID Panel     Status: None (Preliminary result)   Collection Time: 05/01/23  5:52 PM   Specimen: BLOOD  Result Value Ref Range Status   Specimen Description   Final    BLOOD SITE NOT SPECIFIED Performed at [facility], [address] [place], [place] [address]     Special Requests   Final    BOTTLES DRAWN AEROBIC AND ANAEROBIC Blood Culture results may not be optimal due to an inadequate volume of blood received in culture bottles Performed at [facility], [address] [place], [place] [address]    Culture   Final    NO GROWTH 2 DAYS Performed at [facility], [address] [place], [place] [address]    Report Status PENDING  Incomplete  Culture, blood (Routine X 2) w Reflex to ID Panel     Status: None (Preliminary result)   Collection Time: 05/01/23  5:55 PM   Specimen: BLOOD LEFT ARM  Result Value Ref Range Status   Specimen Description   Final    BLOOD LEFT ARM Performed at [facility], [address], [place], [place] [address]    Special Requests   Final    BOTTLES DRAWN AEROBIC AND ANAEROBIC Blood Culture results may not be optimal due to an inadequate volume of blood received in culture bottles Performed at [facility], [address], [place], [place] [address]    Culture   Final    NO GROWTH 2 DAYS Performed at [facility], [address] [place], [place] [address]    Report Status PENDING  Incomplete   CT ABDOMEN PELVIS WO CONTRAST Result Date: 04/29/2023 CLINICAL DATA:  Pyelonephritis suspected. History of renal stone or obstruction. History of urinary tract infection on antibiotics for 2 weeks. Fever starting last night. Hematuria and urgency. EXAM: CT ABDOMEN AND PELVIS WITHOUT CONTRAST TECHNIQUE: Multidetector CT imaging of the abdomen and pelvis was performed following the standard protocol without IV contrast. RADIATION DOSE REDUCTION: This exam was performed according to the departmental dose-optimization program which includes automated exposure control, adjustment of the mA and/or kV according to patient size and/or use of iterative reconstruction technique. COMPARISON:  CT abdomen 08/22/2022. CT chest abdomen and pelvis 05/04/2020. FINDINGS: Lower  chest: Mild  dependent atelectasis in the lungs. Hepatobiliary: No focal liver lesions. Tiny stones in the gallbladder. No inflammatory changes. No bile duct dilatation. Pancreas: Unremarkable. No pancreatic ductal dilatation or surrounding inflammatory changes. Spleen: Normal in size without focal abnormality. Adrenals/Urinary Tract: No adrenal gland nodules. Parenchymal calcification in the left kidney measuring 4 mm diameter. No change since prior study. No hydronephrosis or hydroureter. No ureteral stones or bladder stones. Mild bladder wall thickening with stranding around the ureters possibly indicating cystitis and pyelonephritis. No abscess. Stomach/Bowel: Stomach, small bowel, and colon are not abnormally distended. No wall thickening or inflammatory changes. Appendix is normal. Vascular/Lymphatic: Aortic atherosclerosis. No enlarged abdominal or pelvic lymph nodes. Reproductive: Prostate is unremarkable. Other: No free air or free fluid. Abdominal wall musculature appears intact. Musculoskeletal: Postoperative changes with posterior fixation from L1-L3 crossing a compressed L2 vertebra. Degenerative changes throughout the lumbar spine. Degenerative changes in the hips. IMPRESSION: 1. Nonobstructing stone in the left kidney. 2. Mild bladder wall thickening with stranding around the ureters possibly indicating cystitis with pyelonephritis. No abscess. 3. Cholelithiasis without evidence of acute cholecystitis. 4. Old fracture deformity at L2 with posterior fixation. Electronically Signed   By: Burman Nieves M.D.   On: 04/29/2023 21:22   DG Chest 2 View Result Date: 04/29/2023 CLINICAL DATA:  Fever. EXAM: CHEST - 2 VIEW COMPARISON:  Chest radiograph dated January 10, 2023. FINDINGS: The heart size and mediastinal contours are within normal limits. 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 No focal consolidation, pleural effusion, or pneumothorax. Remote right-sided rib fractures. No acute osseous abnormality.  IMPRESSION: No acute cardiopulmonary findings. Electronically Signed   By: Hart Robinsons M.D.   On: 04/29/2023 16:58   CUP PACEART INCLINIC DEVICE CHECK Result Date: 04/20/2023 CRTP manually checked in office. Normal device function. Testing performed w/ results WDL.  Known PAF + OAC. One episode of Aflutter lasting 1 minute 4 seconds w/ a max V VP 66. 7 episodes of atrial tach with a max episode duration of 59 seconds and max V rate of VP 79. 2 VT classified episodes. Episode ID #2 lasting 1 second in duration and appears atrial driven with a max V rate of 194 and Max A rate 197. VT episode ID # 1 NSVT lasting 1 second in duration Max A rate 79 and Max V rate 194. Overall ATAF burden <0.1%. Effective BV pacing 100 %. Heart failure diagnostics reviewed.  Estimated longevity 12.2 years .  Patient enrolled in clinic standard remote monitoring. Patient education completed. Programming changes made this session. - Set to chronic settings RA 2--4 and RV 2.5--4 - LV set @ 1.25V--4 - LV amplitude safety margin decreased to 0.5V Changes made per SK.Raj Janus, RN  OPAT Diagnosis: Complicated UTI/pyelonephritis. ? Prostatitis ( prostate unremarkable in CT)   Culture Result: ESBL E coli   Allergies  Allergen Reactions   Other Other (See Comments)    Vicryl sutures - patient states that site becomes "soupy"   Iodinated Contrast Media Hives   Iodine Hives and Other (See Comments)    IVP contrast   Penicillins Itching, Rash and Other (See Comments)    ITCHY FEELING IN FINGERS; Tolerated Zosyn 04/2023    OPAT Orders Discharge antibiotics to be given via PICC line Discharge antibiotics: ertapenem 1g iv daily  Per pharmacy protocol  Duration: 2 weeks  End Date: 05/14/23  [facility] Care Per Protocol:  Home health RN for IV administration and teaching; PICC line care and labs.    Labs  weekly while on IV antibiotics: X__ CBC with differential __ BMP X__ CMP __ CRP __ ESR __ Vancomycin trough __  CK  __ Please pull PIC at completion of IV antibiotics X__ Please leave PIC in place until doctor has seen patient or been notified  Fax weekly labs to [phone]  Clinic Follow Up Appt: 1/7 at 8: 45 am with Dr Renold Don  Odette Fraction, MD Infectious Disease Physician [facility] [address] [place], [place] [address] Phone: [phone]  Fax: [phone]

## 2023-05-03 NOTE — Progress Notes (Signed)
PROGRESS NOTE    Thomas [name]  OVF:[id number] DOB: 11/28/1932 DOA: 04/29/2023 PCP: System, Provider Not In    Brief Narrative:   Thomas [name] is a 89 y.o. 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  No abdominal pain or dysuria but had hematuria.  Patient follows up with Dr. Laverle Patter urology as outpatient and was advised to come to the ED for these symptoms.  Of note patient recently had UTI beginning of this month and was treated with course of doxycycline and Macrobid.  In the ED, patient was afebrile.  CBC showed mild leukocytosis at 16.5.  COVID influenza and RSV was negative.  Lactate was within normal range.  Urinalysis showed a large leukocytes with nitrite.  Creatinine was noted to be at 2.4 from baseline 1.5.  CT scan of the abdomen and pelvis with nonobstructive stone in the left kidney, mild bladder wall thickening indicating cystitis with pyelonephritis. ED provider spoke with urologist Dr. Laverle Patter and he recommended admission due to hx of bladder diverticulum.   Assessment and Plan:  * AKI (acute kidney injury) on CKD stage IIIb. Likely prerenal from decreased oral intake volume depletion.  Creatinine had elevated to 2.40 from baseline of 1.4.  Received IV fluid and creatinine today at 1.7 and  improved.  Patient is positive balance for 1551 mL.  Urology following.     E. coli UTI (urinary tract infection) .  CT scan with possible cystitis, potential pyelonephritis. Blood cultures and urine culture now positive for  ESBL E. coli.  Currently on meropenem.  ID on board and plan for midline placement and outpatient ertapenem 1 g daily until 05/14/2023.    Repeat blood cultures have been sent on 05/01/2023 negative in 2  days.  Chronic systolic heart failure (HCC) -Last reported 2D echocardiogram in 10/2022 with EF of 30 to 35%, moderately to severe decreased LV function.  Mild LVH.  Indeterminate diastolic parameters.  Continue to hold Entresto due to AKI.  At present, appears to be compensated.    Persistent atrial fibrillation (HCC) Continue anticoagulation, metoprolol Tikosyn from home.  Controlled.  Aim. for potassium more than 4.  Latest potassium of 4.1.  Complete heart block (HCC) S/p pacemaker, ID on board, repeat blood cultures are negative.Hyperlipidemia  Continue statin   Essential hypertension.  Continue metoprolol.  Hold Entresto.  Diarrhea could be antibiotic associated.  Continue probiotic Imodium.  Has improved at this time.    DVT prophylaxis: apixaban (ELIQUIS) tablet 2.5 mg Start: 04/29/23 2245 apixaban (ELIQUIS) tablet 2.5 mg   Code Status:     Code Status: Full Code  Disposition: Home likely on 05/04/2023 after home antibiotics been set up/midline done.  Status is: Inpatient  The patient is  inpatient because: ESBL infection, IV antibiotic, outpatient antibiotic treatment plan,   Family Communication: I again spoke with the patient's spouse at bedside.  Consultants:  Urology ID   Procedures:  None  Antimicrobials:  Meropenem.  Anti-infectives (From admission, onward)    Start     Dose/Rate Route Frequency Ordered Stop   05/04/23 0600  ertapenem (INVANZ) 1 g in sodium chloride 0.9 % 100 mL IVPB        1 g 200 mL/hr over 30 Minutes Intravenous Every 24 hours 05/02/23 1402  04/30/23 1400  meropenem (MERREM) 1 g in sodium chloride 0.9 % 100 mL IVPB        1 g 200 mL/hr over 30 Minutes Intravenous Every 12 hours 04/30/23 1204 05/04/23 0359   04/29/23 2200  piperacillin-tazobactam (ZOSYN) IVPB 2.25 g  Status:  Discontinued        2.25 g 100 mL/hr over 30 Minutes Intravenous Every 8 hours 04/29/23 1956 04/30/23 1204      Subjective: Today, patient was seen and  examined at bedside.  Patient overall feels better.  Denies any interval complaints.  Denies any shortness of breath, cough, fever, chills or rigor.  Denies any urinary urgency, frequency or dysuria.  Patient's wife at bedside.    Objective: Vitals:   05/02/23 1001 05/02/23 1357 05/03/23 0526 05/03/23 1301  BP: (!) 131/54 (!) 146/66 (!) 153/73 (!) 154/68  Pulse: 60 (!) 59 63 66  Resp:  16 18 16   Temp:  98 F (36.7 C) 98.5 F (36.9 C) 98 F (36.7 C)  TempSrc:  Oral Oral   SpO2:  97% 94% 98%  Weight:      Height:        Intake/Output Summary (Last 24 hours) at 05/03/2023 1541 Last data filed at 05/03/2023 0527 Gross per 24 hour  Intake 640 ml  Output --  Net 640 ml   Filed Weights   04/29/23 1429 04/29/23 2324  Weight: 70.3 kg 70 kg    Physical Examination: Body mass index is 25.68 kg/m.   General:  Average built, not in obvious distress, elderly male, Communicative, HENT:   No scleral pallor or icterus noted. Oral mucosa is moist.  Chest:  Clear breath sounds.   No crackles or wheezes.  CVS: S1 &S2 heard. No murmur.  Regular rate and rhythm.  Chest wall pacer in place Abdomen: Soft, nontender, nondistended.  Bowel sounds are heard.   Extremities: No cyanosis, clubbing or edema.  Peripheral pulses are palpable. Psych: Alert, awake and oriented, normal mood CNS:  No cranial nerve deficits.  Power equal in all extremities.   Skin: Warm and dry.  No rashes noted.  Data Reviewed:   CBC: Recent Labs  Lab 04/29/23 1548 04/30/23 0533 05/01/23 0534 05/02/23 0523 05/03/23 0603  WBC 16.5* 13.3* 10.6* 7.1 6.0  NEUTROABS 13.2*  --   --   --   --   HGB 14.1 13.9 13.4 13.0 12.8*  HCT 44.5 41.7 41.0 39.0 39.4  MCV 90.3 89.5 88.4 87.1 88.9  PLT 177 149* 123* 121* 156    Basic Metabolic Panel: Recent Labs  Lab 04/29/23 1548 04/30/23 0533 05/01/23 0534 05/02/23 0523 05/03/23 0603  NA 134* 137 133* 134* 136  K 3.8 4.2 3.7 4.6 4.1  CL 99 103 104 103 103  CO2 28 26  24 22 27   GLUCOSE 127* 126* 103* 94 91  BUN 36* 34* 29* 25* 24*  CREATININE 2.40* 2.34* 2.04* 1.51* 1.71*  CALCIUM 8.6* 8.6* 8.0* 8.1* 8.7*  MG  --   --  1.9  --  2.0    Liver Function Tests: Recent Labs  Lab 04/29/23 1548  AST 15  ALT 10  ALKPHOS 55  BILITOT 0.8  PROT 6.7  ALBUMIN 3.5     Radiology Studies: No results found.     LOS: 3 days    Joycelyn Das, MD [facility] Available via Epic secure chat 7am-7pm After these hours, please refer to coverage provider listed on [email or web address] 05/03/2023, 3:41 PM

## 2023-05-04 DIAGNOSIS — N179 Acute kidney failure, unspecified: Secondary | ICD-10-CM | POA: Diagnosis not present

## 2023-05-04 LAB — RENAL FUNCTION PANEL
Albumin: 3.1 g/dL — ABNORMAL LOW (ref 3.5–5.0)
Anion gap: 7 (ref 5–15)
BUN: 25 mg/dL — ABNORMAL HIGH (ref 8–23)
CO2: 27 mmol/L (ref 22–32)
Calcium: 9.1 mg/dL (ref 8.9–10.3)
Chloride: 103 mmol/L (ref 98–111)
Creatinine, Ser: 1.62 mg/dL — ABNORMAL HIGH (ref 0.61–1.24)
GFR, Estimated: 41 mL/min — ABNORMAL LOW (ref >60)
Glucose, Bld: 96 mg/dL (ref 70–99)
Phosphorus: 2.9 mg/dL (ref 2.5–4.6)
Potassium: 4.1 mmol/L (ref 3.5–5.1)
Sodium: 137 mmol/L (ref 135–145)

## 2023-05-04 LAB — CBC WITH DIFFERENTIAL/PLATELET
Abs Immature Granulocytes: 0.03 10*3/uL (ref 0.00–0.07)
Basophils Absolute: 0 10*3/uL (ref 0.0–0.1)
Basophils Relative: 0 %
Eosinophils Absolute: 0.4 10*3/uL (ref 0.0–0.5)
Eosinophils Relative: 5 %
HCT: 43.4 % (ref 39.0–52.0)
Hemoglobin: 14.3 g/dL (ref 13.0–17.0)
Immature Granulocytes: 0 %
Lymphocytes Relative: 32 %
Lymphs Abs: 2.3 10*3/uL (ref 0.7–4.0)
MCH: 29.2 pg (ref 26.0–34.0)
MCHC: 32.9 g/dL (ref 30.0–36.0)
MCV: 88.6 fL (ref 80.0–100.0)
Monocytes Absolute: 1.2 10*3/uL — ABNORMAL HIGH (ref 0.1–1.0)
Monocytes Relative: 17 %
Neutro Abs: 3.4 10*3/uL (ref 1.7–7.7)
Neutrophils Relative %: 46 %
Platelets: 193 10*3/uL (ref 150–400)
RBC: 4.9 MIL/uL (ref 4.22–5.81)
RDW: 14.3 % (ref 11.5–15.5)
WBC: 7.4 10*3/uL (ref 4.0–10.5)
nRBC: 0 % (ref 0.0–0.2)

## 2023-05-04 LAB — CBC
HCT: 41.8 % (ref 39.0–52.0)
Hemoglobin: 13.4 g/dL (ref 13.0–17.0)
MCH: 29.1 pg (ref 26.0–34.0)
MCHC: 32.1 g/dL (ref 30.0–36.0)
MCV: 90.7 fL (ref 80.0–100.0)
Platelets: 182 10*3/uL (ref 150–400)
RBC: 4.61 MIL/uL (ref 4.22–5.81)
RDW: 14.4 % (ref 11.5–15.5)
WBC: 6.7 10*3/uL (ref 4.0–10.5)
nRBC: 0 % (ref 0.0–0.2)

## 2023-05-04 MED ORDER — FUROSEMIDE 40 MG PO TABS: 20.0000 mg | ORAL_TABLET | Freq: Every day | ORAL | Status: DC | PRN

## 2023-05-04 MED ORDER — ERTAPENEM IV (FOR PTA / DISCHARGE USE ONLY): 1.0000 g | INTRAVENOUS | 0 refills | Status: DC

## 2023-05-04 NOTE — Plan of Care (Signed)

## 2023-05-04 NOTE — Plan of Care (Signed)

## 2023-05-04 NOTE — Plan of Care (Deleted)
°  Problem: Education: Goal: Knowledge of General Education information will improve Description: Including pain rating scale, medication(s)/side effects and non-pharmacologic comfort measures 05/04/2023 1713 by Joycelyn Das, RN Outcome: Progressing 05/04/2023 1713 by Joycelyn Das, RN Outcome: Progressing   Problem: Health Behavior/Discharge Planning: Goal: Ability to manage health-related needs will improve 05/04/2023 1713 by Joycelyn Das, RN Outcome: Progressing 05/04/2023 1713 by Joycelyn Das, RN Outcome: Progressing   Problem: Clinical Measurements: Goal: Ability to maintain clinical measurements within normal limits will improve 05/04/2023 1713 by Joycelyn Das, RN Outcome: Progressing 05/04/2023 1713 by Joycelyn Das, RN Outcome: Progressing Goal: Will remain free from infection 05/04/2023 1713 by Joycelyn Das, RN Outcome: Progressing 05/04/2023 1713 by Joycelyn Das, RN Outcome: Progressing Goal: Diagnostic test results will improve 05/04/2023 1713 by Joycelyn Das, RN Outcome: Progressing 05/04/2023 1713 by Joycelyn Das, RN Outcome: Progressing Goal: Respiratory complications will improve 05/04/2023 1713 by Joycelyn Das, RN Outcome: Progressing 05/04/2023 1713 by Joycelyn Das, RN Outcome: Progressing Goal: Cardiovascular complication will be avoided 05/04/2023 1713 by Joycelyn Das, RN Outcome: Progressing 05/04/2023 1713 by Joycelyn Das, RN Outcome: Progressing   Problem: Activity: Goal: Risk for activity intolerance will decrease 05/04/2023 1713 by Joycelyn Das, RN Outcome: Progressing 05/04/2023 1713 by Joycelyn Das, RN Outcome: Progressing   Problem: Nutrition: Goal: Adequate nutrition will be maintained 05/04/2023 1713 by Joycelyn Das, RN Outcome: Progressing 05/04/2023 1713 by Joycelyn Das, RN Outcome: Progressing   Problem: Coping: Goal: Level of anxiety will decrease 05/04/2023 1713 by Joycelyn Das, RN Outcome:  Progressing 05/04/2023 1713 by Joycelyn Das, RN Outcome: Progressing   Problem: Elimination: Goal: Will not experience complications related to bowel motility 05/04/2023 1713 by Joycelyn Das, RN Outcome: Progressing 05/04/2023 1713 by Joycelyn Das, RN Outcome: Progressing Goal: Will not experience complications related to urinary retention 05/04/2023 1713 by Joycelyn Das, RN Outcome: Progressing 05/04/2023 1713 by Joycelyn Das, RN Outcome: Progressing   Problem: Pain Management: Goal: General experience of comfort will improve 05/04/2023 1713 by Joycelyn Das, RN Outcome: Progressing 05/04/2023 1713 by Joycelyn Das, RN Outcome: Progressing   Problem: Safety: Goal: Ability to remain free from injury will improve 05/04/2023 1713 by Joycelyn Das, RN Outcome: Progressing 05/04/2023 1713 by Joycelyn Das, RN Outcome: Progressing   Problem: Skin Integrity: Goal: Risk for impaired skin integrity will decrease 05/04/2023 1713 by Joycelyn Das, RN Outcome: Progressing 05/04/2023 1713 by Joycelyn Das, RN Outcome: Progressing

## 2023-05-04 NOTE — TOC Progression Note (Signed)
Transition of Care [facility]) - Progression Note    Patient Details  Name: Thomas [name] MRN: [id number] Date of Birth: [birth year]  [facility] CM/SW Contact  Adrian Prows, RN Phone Number: 05/04/2023, 2:26 PM  Clinical Narrative:    OPAT orders received; spoke w/ pt and wife in room; he agrees to receive home IV abx; he does not have an agency preference; contacted w/ Jeri Modena at [facility]; she says agency can provide service and RN; she also says patient teaching can take place tomorrow; Pam will contact the pt; pt notified and agrees to d/c plan; agency contact info placed in follow up provider section of d/c instructions.    Barriers to Discharge: Continued Medical Work up  Expected Discharge Plan and Services                                               Social Determinants of Health (SDOH) Interventions SDOH Screenings   Food Insecurity: No Food Insecurity (04/30/2023)  Housing: Low Risk  (04/30/2023)  Transportation Needs: No Transportation Needs (04/30/2023)  Utilities: Not At Risk (04/30/2023)  Alcohol Screen: Low Risk  (09/26/2021)  Depression (PHQ2-9): Low Risk  (01/09/2022)  Financial Resource Strain: Low Risk  (09/26/2021)  Physical Activity: Sufficiently Active (09/26/2021)  Social Connections: Socially Integrated (09/26/2021)  Stress: No Stress Concern Present (09/26/2021)  Tobacco Use: Low Risk  (04/29/2023)    Readmission Risk Interventions     No data to display

## 2023-05-04 NOTE — Progress Notes (Signed)
[facility] Progress Note Patient: Thomas [name] UJW:[id number] DOB: 22-Aug-1932 DOA: 04/29/2023  DOS: the patient was seen and examined on 05/04/2023  Brief Hospital Course: REVIS BOTZ is a 89 y.o. male, retired Insurance underwriter, with PMH of CHB s/p PPM, HTN, perm a.fib on Eliquis, CVA, chronic HFrEF, HLD, RCC, BPH SP TUMT presented with fever of 102 with nausea and decreased appetite.   Prior to admission, treated for UTI 12/24 with course of doxycycline and Macrobid. Admitted for AKI and ESBL E. coli pyelonephritis.  Assessment and Plan: AKI on CKD 3A. Baseline serum creatinine 1.5. On admission serum creatinine 2.4. Likely prerenal from decreased oral intake.  Treated with IV hydration with improvement to around 1.6. Outpatient referral to nephrology recommended for the patient for his CKD.   ESBL E. coli pyelonephritis Secondary to bladder outlet obstruction. Presented with fever and leukocytosis. CT scan shows evidence of cystitis and pyelonephritis. Urine culture grew ESBL E. coli as well as blood culture. To be cultures negative. Initially was on IV Zosyn.,  Now on IV Invanz. ID consulted, appreciate assistance.  Midline was placed on 12/28.  Consulted TOC for home antibiotic was placed on 12/29. Education will be arranged on 12/30.  Patient likely home on 12/30.   Chronic systolic heart failure Persistent A-fib Complete heart block Complete heart block S/p pacemaker HTN Last reported echocardiogram in 10/2022 with EF of 30 to 35%, moderately to severe decreased LV function.  Home regimen includes holding Entresto, Eliquis, Tikosyn, Toprol-XL 50 mg. Sherryll Burger is on hold due to AKI. At present, appears to be compensated and rate controlled.   Hyperlipidemia Continue statin   Mood disorder. On Cymbalta. Continuing. On Wellbutrin. Continue.  Neuropathy  on gabapentin. Continue.  GERD. On PPI.  Testosterone deficiency. On chronic injection as well as  gel. Currently on hold.  Outpatient resumption.   Subjective: No nausea no vomiting no fever no chills.  Oral intake adequate.  Physical Exam: General: in Mild distress, No Rash Cardiovascular: S1 and S2 Present, No Murmur Respiratory: Good respiratory effort, Bilateral Air entry present. No Crackles, No wheezes Abdomen: Bowel Sound present, No tenderness Extremities: No edema Neuro: Alert and oriented x3, no new focal deficit  Data Reviewed: I have Reviewed nursing notes, Vitals, and Lab results. Since last encounter, pertinent lab results CBC and BMP   . I have ordered test including CBC and BMP  .   Disposition: Status is: Inpatient Remains inpatient appropriate because: Awaiting arrangement of outpatient antibiotics  apixaban (ELIQUIS) tablet 2.5 mg Start: 04/29/23 2245 apixaban (ELIQUIS) tablet 2.5 mg   Family Communication: Family at bedside Level of care: Telemetry switch to MedSurg. Vitals:   05/03/23 1301 05/03/23 2135 05/04/23 0650 05/04/23 1412  BP: (!) 154/68 (!) 154/70 (!) 165/76 (!) 149/74  Pulse: 66 60 70 61  Resp: 16 16 15 20   Temp: 98 F (36.7 C) 98.2 F (36.8 C) 98.2 F (36.8 C) 97.8 F (36.6 C)  TempSrc:  Oral Oral Oral  SpO2: 98% 97% 95% 96%  Weight:      Height:         Author: Lynden Oxford, MD 05/04/2023 5:26 PM  Please look on [email or web address] to find out who is on call.

## 2023-05-04 NOTE — Progress Notes (Signed)
PHARMACY CONSULT NOTE FOR:  OUTPATIENT  PARENTERAL ANTIBIOTIC THERAPY (OPAT)  Indication: complicated UTI/pyelo +/- prostatitis Regimen: ertapenem 1g IV daily End date: 05/14/2023  IV antibiotic discharge orders are pended. To discharging provider:  please sign these orders via discharge navigator,  Select New Orders & click on the button choice - Manage This Unsigned Work.     Thank you for allowing pharmacy to be a part of this patient's care.  Halea Lieb A 05/04/2023, 11:19 AM

## 2023-05-04 NOTE — Plan of Care (Signed)
°  Problem: Clinical Measurements: Goal: Diagnostic test results will improve Outcome: Progressing   Problem: Activity: Goal: Risk for activity intolerance will decrease Outcome: Progressing   Problem: Nutrition: Goal: Adequate nutrition will be maintained Outcome: Progressing   Problem: Elimination: Goal: Will not experience complications related to bowel motility Outcome: Progressing Goal: Will not experience complications related to urinary retention Outcome: Progressing   Problem: Pain Management: Goal: General experience of comfort will improve Outcome: Progressing

## 2023-05-05 DIAGNOSIS — N179 Acute kidney failure, unspecified: Secondary | ICD-10-CM | POA: Diagnosis not present

## 2023-05-05 LAB — BASIC METABOLIC PANEL
Anion gap: 8 (ref 5–15)
BUN: 26 mg/dL — ABNORMAL HIGH (ref 8–23)
CO2: 25 mmol/L (ref 22–32)
Calcium: 8.8 mg/dL — ABNORMAL LOW (ref 8.9–10.3)
Chloride: 101 mmol/L (ref 98–111)
Creatinine, Ser: 1.57 mg/dL — ABNORMAL HIGH (ref 0.61–1.24)
GFR, Estimated: 42 mL/min — ABNORMAL LOW (ref >60)
Glucose, Bld: 91 mg/dL (ref 70–99)
Potassium: 4.1 mmol/L (ref 3.5–5.1)
Sodium: 134 mmol/L — ABNORMAL LOW (ref 135–145)

## 2023-05-05 LAB — MAGNESIUM: Magnesium: 2 mg/dL (ref 1.7–2.4)

## 2023-05-05 MED ORDER — ERTAPENEM IV (FOR PTA / DISCHARGE USE ONLY): 500.0000 mg | INTRAVENOUS | 0 refills | Status: AC

## 2023-05-05 MED ORDER — HYDRALAZINE HCL 25 MG PO TABS: 25.0000 mg | ORAL_TABLET | ORAL | 0 refills | Status: DC | PRN

## 2023-05-05 MED ORDER — FUROSEMIDE 40 MG PO TABS: 20.0000 mg | ORAL_TABLET | Freq: Every day | ORAL | Status: DC | PRN

## 2023-05-05 NOTE — Plan of Care (Signed)
  Problem: Education: Goal: Knowledge of General Education information will improve Description: Including pain rating scale, medication(s)/side effects and non-pharmacologic comfort measures Outcome: Progressing   Problem: Coping: Goal: Level of anxiety will decrease Outcome: Progressing   Problem: Elimination: Goal: Will not experience complications related to bowel motility Outcome: Progressing   Problem: Pain Management: Goal: General experience of comfort will improve Outcome: Progressing   Problem: Safety: Goal: Ability to remain free from injury will improve Outcome: Progressing

## 2023-05-05 NOTE — Progress Notes (Signed)
AVS reviewed with PT and his wife. Educated about Antibiotics by home pharmacy all  inquires addressed. PT will go home with Midline for antibiotics. PT has no complaint of chest pain, SOB, or discomfort.

## 2023-05-05 NOTE — Progress Notes (Signed)
PHARMACY CONSULT NOTE FOR:  OUTPATIENT  PARENTERAL ANTIBIOTIC THERAPY (OPAT)  Indication: complicated UTI/pyelo +/- prostatitis Regimen: ertapenem 500 mg IV daily End date: 05/14/2023  IV antibiotic discharge orders are pended. To discharging provider:  please sign these orders via discharge navigator,  Select New Orders & click on the button choice - Manage This Unsigned Work.     Thank you for allowing pharmacy to be a part of this patient's care.  Sharin Mons, PharmD, BCPS, BCIDP Infectious Diseases Clinical Pharmacist Phone: [phone] 05/05/2023, 8:20 AM

## 2023-05-06 ENCOUNTER — Ambulatory Visit: Payer: Medicare Other

## 2023-05-06 ENCOUNTER — Other Ambulatory Visit: Payer: Self-pay

## 2023-05-06 DIAGNOSIS — I4819 Other persistent atrial fibrillation: Secondary | ICD-10-CM

## 2023-05-06 DIAGNOSIS — I442 Atrioventricular block, complete: Secondary | ICD-10-CM

## 2023-05-06 DIAGNOSIS — I10 Essential (primary) hypertension: Secondary | ICD-10-CM

## 2023-05-06 DIAGNOSIS — I480 Paroxysmal atrial fibrillation: Secondary | ICD-10-CM

## 2023-05-06 DIAGNOSIS — N390 Urinary tract infection, site not specified: Secondary | ICD-10-CM | POA: Diagnosis not present

## 2023-05-06 DIAGNOSIS — G459 Transient cerebral ischemic attack, unspecified: Secondary | ICD-10-CM

## 2023-05-06 DIAGNOSIS — I639 Cerebral infarction, unspecified: Secondary | ICD-10-CM

## 2023-05-06 LAB — CULTURE, BLOOD (ROUTINE X 2)
Culture: NO GROWTH
Culture: NO GROWTH

## 2023-05-07 DIAGNOSIS — N390 Urinary tract infection, site not specified: Secondary | ICD-10-CM | POA: Diagnosis not present

## 2023-05-08 ENCOUNTER — Telehealth: Payer: Self-pay | Admitting: Urology

## 2023-05-08 ENCOUNTER — Encounter: Payer: Medicare Other | Admitting: Physical Therapy

## 2023-05-08 DIAGNOSIS — N401 Enlarged prostate with lower urinary tract symptoms: Secondary | ICD-10-CM

## 2023-05-08 NOTE — Telephone Encounter (Signed)
-----   Message from Terrell M sent at 05/08/2023 11:58 AM EST ----- Regarding: PT order Good morning!  This patient is returning to see Shin-Yiing this afternoon, but was recently d/c from the hospital,. Can you send a new order to us  Thanks! Coralie Clay

## 2023-05-09 DIAGNOSIS — N390 Urinary tract infection, site not specified: Secondary | ICD-10-CM | POA: Diagnosis not present

## 2023-05-12 ENCOUNTER — Ambulatory Visit (HOSPITAL_BASED_OUTPATIENT_CLINIC_OR_DEPARTMENT_OTHER): Payer: Medicare Other | Attending: Cardiovascular Disease | Admitting: Cardiovascular Disease

## 2023-05-12 VITALS — Ht 65.0 in | Wt 155.0 lb

## 2023-05-12 DIAGNOSIS — R0683 Snoring: Secondary | ICD-10-CM | POA: Insufficient documentation

## 2023-05-12 DIAGNOSIS — G4736 Sleep related hypoventilation in conditions classified elsewhere: Secondary | ICD-10-CM | POA: Insufficient documentation

## 2023-05-12 DIAGNOSIS — G4733 Obstructive sleep apnea (adult) (pediatric): Secondary | ICD-10-CM | POA: Insufficient documentation

## 2023-05-12 DIAGNOSIS — I480 Paroxysmal atrial fibrillation: Secondary | ICD-10-CM | POA: Insufficient documentation

## 2023-05-13 ENCOUNTER — Inpatient Hospital Stay: Payer: Medicare Other | Admitting: Internal Medicine

## 2023-05-13 DIAGNOSIS — Z8619 Personal history of other infectious and parasitic diseases: Secondary | ICD-10-CM | POA: Diagnosis not present

## 2023-05-13 DIAGNOSIS — N390 Urinary tract infection, site not specified: Secondary | ICD-10-CM | POA: Diagnosis not present

## 2023-05-13 DIAGNOSIS — N10 Acute tubulo-interstitial nephritis: Secondary | ICD-10-CM | POA: Diagnosis not present

## 2023-05-13 DIAGNOSIS — Z85528 Personal history of other malignant neoplasm of kidney: Secondary | ICD-10-CM | POA: Diagnosis not present

## 2023-05-13 DIAGNOSIS — I5043 Acute on chronic combined systolic (congestive) and diastolic (congestive) heart failure: Secondary | ICD-10-CM | POA: Diagnosis not present

## 2023-05-13 DIAGNOSIS — Z8673 Personal history of transient ischemic attack (TIA), and cerebral infarction without residual deficits: Secondary | ICD-10-CM | POA: Diagnosis not present

## 2023-05-13 DIAGNOSIS — I2510 Atherosclerotic heart disease of native coronary artery without angina pectoris: Secondary | ICD-10-CM | POA: Diagnosis not present

## 2023-05-13 DIAGNOSIS — I480 Paroxysmal atrial fibrillation: Secondary | ICD-10-CM | POA: Diagnosis not present

## 2023-05-13 DIAGNOSIS — I110 Hypertensive heart disease with heart failure: Secondary | ICD-10-CM | POA: Diagnosis not present

## 2023-05-14 ENCOUNTER — Ambulatory Visit: Payer: Medicare Other | Admitting: Urology

## 2023-05-14 VITALS — BP 138/81 | HR 77 | Ht 65.0 in | Wt 149.4 lb

## 2023-05-14 DIAGNOSIS — R82998 Other abnormal findings in urine: Secondary | ICD-10-CM | POA: Diagnosis not present

## 2023-05-14 DIAGNOSIS — N393 Stress incontinence (female) (male): Secondary | ICD-10-CM

## 2023-05-14 DIAGNOSIS — R3914 Feeling of incomplete bladder emptying: Secondary | ICD-10-CM | POA: Diagnosis not present

## 2023-05-14 DIAGNOSIS — Z87440 Personal history of urinary (tract) infections: Secondary | ICD-10-CM | POA: Diagnosis not present

## 2023-05-14 DIAGNOSIS — N401 Enlarged prostate with lower urinary tract symptoms: Secondary | ICD-10-CM

## 2023-05-14 LAB — BLADDER SCAN AMB NON-IMAGING: Scan Result: 30

## 2023-05-14 NOTE — Progress Notes (Signed)
 LILLETTE Venetia PEDLAR Plume,acting as a scribe for Rosina Riis, MD.,have documented all relevant documentation on the behalf of Rosina Riis, MD,as directed by  Rosina Riis, MD while in the presence of Rosina Riis, MD.  05/14/23 12:19 PM   [name] 05-20-1932 [id number]  Referring provider: No referring provider defined for this encounter.  Chief Complaint  Patient presents with   Hospitalization Follow-up    HPI: 89 year-old male who returns today for follow up after a recent admission for febrile UTI versus prostatitis.   He has a personal history of massive BPH, status post HOLEP in 2022. He had been doing fairly well from a urinary standpoint other than some ongoing stress incontinence. He developed acute urinary symptoms prior to Christmas while traveling. He was started on Macrobid and Doxycycline , but failed to return to baseline with antibiotics. He ultimately presented to the emergency room on Christmas Eve and was found to be septic with bacteriemia. He eventually grew E.coli both in his urine and blood of the ESBL variety. He did demonstrate adequate emptying during the admission. Infectious disease was consulted and he discharged on IV ertapenem . His last day of antibiotics is today.   His urinalysis today shows only 6-10 WBC and otherwise negative. His PVR is 30 mL.  He reports ongoing stress incontinence, particularly with activities such as lifting, exercising, climbing, jumping, coughing, and sneezing. He has devised various methods to manage the leakage. He is aware of newer clamps available but has not found them satisfactory.  Results for orders placed or performed in visit on 05/14/23  Bladder Scan (Post Void Residual) in office  Result Value Ref Range   Scan Result 30 ml     PMH: Past Medical History:  Diagnosis Date   Aortic valve disorders    Arthritis    Atrial flutter (HCC) 06/18/2016   AF or AFl; not sure which (06/23/2016)   Basal cell  carcinoma    face, nose left shoulder, left arm (06/19/2016)   Basal cell carcinoma 09/13/2020   right temple   BBB (bundle branch block)    hx right   Chronic back pain    neck, thoracic, lower back (06/19/2016)   Complete heart block (HCC) 06/2016   Dyspnea    GERD (gastroesophageal reflux disease)    Gout    Heart block    I've had type I, II Wenke before now (06/19/2016)   History of gout    History of hiatal hernia    self dx'd (06/19/2016)   Hyperlipidemia    Hypertension    Lyme disease    dx'd by me 2003; cx's showed dx 08/2015   Migraine    3-4/year (06/19/2016)   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   PVC's (premature ventricular contractions)    Renal cancer, left (HCC) 2006   S/P cryotherapy   Spinal stenosis    cervical, 1 thoracic, lumbar (06/19/2016)   Squamous carcinoma    face, nose left shoulder, left arm (06/19/2016)   Stroke (HCC)    TIA (transient ischemic attack) 06/14/2016   I'm not sure that's what it was (06/25/2016)   Visit for monitoring Tikosyn  therapy 09/09/2017    Surgical History: Past Surgical History:  Procedure Laterality Date   ANKLE FRACTURE SURGERY Right 1967   BACK SURGERY  05/07/2020   BASAL CELL CARCINOMA EXCISION     face, nose left shoulder, left arm   BIOPSY PROSTATE  2001 & 2003   BIV UPGRADE N/A 01/10/2023   Procedure: BIV UPGRADE;  Surgeon: Fernande Elspeth BROCKS, MD;  Location: [facility] INVASIVE CV LAB;  Service: Cardiovascular;  Laterality: N/A;   CARDIAC CATHETERIZATION  1990's   CARDIOVERSION N/A 09/11/2017   Procedure: CARDIOVERSION;  Surgeon: Pietro Redell RAMAN, MD;  Location: [facility] ENDOSCOPY;  Service: Cardiovascular;  Laterality: N/A;   FRACTURE SURGERY     HOLEP-LASER ENUCLEATION OF THE PROSTATE WITH MORCELLATION N/A 07/10/2020   Procedure: HOLEP-LASER ENUCLEATION OF THE PROSTATE WITH MORCELLATION;  Surgeon: Penne Knee, MD;  Location: [facility] ORS;  Service: Urology;   Laterality: N/A;   INGUINAL HERNIA REPAIR Left 2012   INSERT / REPLACE / REMOVE PACEMAKER  06/19/2016   LAPAROSCOPIC ABLATION RENAL MASS     LEFT HEART CATH AND CORONARY ANGIOGRAPHY Left 10/23/2017   Procedure: LEFT HEART CATH AND CORONARY ANGIOGRAPHY;  Surgeon: Darron Deatrice LABOR, MD;  Location: [facility] INVASIVE CV LAB;  Service: Cardiovascular;  Laterality: Left;   LEFT HEART CATH AND CORONARY ANGIOGRAPHY Left 02/26/2021   Procedure: LEFT HEART CATH AND CORONARY ANGIOGRAPHY;  Surgeon: Darron Deatrice LABOR, MD;  Location: [facility] INVASIVE CV LAB;  Service: Cardiovascular;  Laterality: Left;   PACEMAKER IMPLANT N/A 06/19/2016   Procedure: Pacemaker Implant;  Surgeon: Elspeth BROCKS Fernande, MD;  Location: [facility] INVASIVE CV LAB;  Service: Cardiovascular;  Laterality: N/A;   pacemasker     PROSTATE SURGERY     RIGHT/LEFT HEART CATH AND CORONARY ANGIOGRAPHY Bilateral 11/25/2022   Procedure: RIGHT/LEFT HEART CATH AND CORONARY ANGIOGRAPHY;  Surgeon: Darron Deatrice LABOR, MD;  Location: [facility] INVASIVE CV LAB;  Service: Cardiovascular;  Laterality: Bilateral;   SQUAMOUS CELL CARCINOMA EXCISION     face, nose left shoulder, left arm   TONSILLECTOMY AND ADENOIDECTOMY      Home Medications:  Allergies as of 05/14/2023       Reactions   Other Other (See Comments)   Vicryl sutures - patient states that site becomes soupy   Iodinated Contrast Media Hives   Iodine Hives, Other (See Comments)   IVP contrast   Penicillins Itching, Rash, Other (See Comments)   ITCHY FEELING IN FINGERS; Tolerated Zosyn  04/2023        Medication List        Accurate as of May 14, 2023 12:19 PM. If you have any questions, ask your nurse or doctor.          STOP taking these medications    furosemide  40 MG tablet Commonly known as: LASIX        TAKE these medications    2-3CC SYRINGE 3 ML Misc 1 mg by Does not apply route every 14 (fourteen) days.   acetaminophen  500 MG tablet Commonly known as: TYLENOL  Take 500-1,000  mg by mouth every 6 (six) hours as needed for mild pain (pain score 1-3) or fever.   apixaban  2.5 MG Tabs tablet Commonly known as: Eliquis  Take 1 tablet (2.5 mg total) by mouth 2 (two) times daily.   Artificial Tears ophthalmic solution Place 1 drop into both eyes in the morning, at noon, in the evening, and at bedtime.   atorvastatin  40 MG tablet Commonly known as: LIPITOR  Take 1 tablet (40 mg total) by mouth daily.   BD Disp Needles 18G X 1-1/2 Misc Generic drug: NEEDLE (DISP) 18 G 1 mg by Does not apply route every 14 (fourteen) days.   BD Disp Needles 21G X 1-1/2 Misc Generic drug: NEEDLE (DISP) 21 G 1 mg by Does not  apply route every 14 (fourteen) days.   buPROPion  150 MG 24 hr tablet Commonly known as: WELLBUTRIN  XL Take 150 mg by mouth daily.   calcium  carbonate 500 MG chewable tablet Commonly known as: TUMS - dosed in mg elemental calcium  Chew 1-2 tablets by mouth daily as needed for indigestion.   colchicine  0.6 MG tablet Take 0.6 mg by mouth daily as needed (for gout flares).   CoQ10 100 MG Caps Take 100 mg by mouth daily.   docusate sodium  100 MG capsule Commonly known as: COLACE Take 100 mg by mouth daily as needed for mild constipation.   dofetilide  125 MCG capsule Commonly known as: TIKOSYN  Take 1 capsule (125 mcg total) by mouth 2 (two) times daily.   DULoxetine  20 MG capsule Commonly known as: CYMBALTA  Take 20 mg by mouth 2 (two) times daily.   ertapenem  IVPB Commonly known as: INVANZ  Inject 500 mg into the vein daily for 9 days. Indication:  complicated UTI/pyelo +/- prostatitis First Dose: Yes Last Day of Therapy:  05/14/2023 Labs - Once weekly:  CBC/D and CMP, Method of administration: Mini-Bag Plus / Gravity Method of administration may be changed at the discretion of home infusion pharmacist based upon assessment of the patient and/or caregiver's ability to self-administer the medication ordered.   febuxostat  40 MG tablet Commonly known  as: ULORIC  Take 1 tablet (40 mg total) by mouth daily.   gabapentin  300 MG capsule Commonly known as: NEURONTIN  Take 300 mg by mouth at bedtime.   hydrALAZINE  25 MG tablet Commonly known as: APRESOLINE  Take 1 tablet (25 mg total) by mouth as needed (for a Systolic reading of 160 that will not decrease after 30 minutes rest.).   methocarbamol  500 MG tablet Commonly known as: ROBAXIN  Take 500 mg by mouth every 6 (six) hours as needed for muscle spasms.   metoprolol  succinate 50 MG 24 hr tablet Commonly known as: TOPROL -XL TAKE 1 TABLET BY MOUTH DAILY WITH OR IMMEDIATELY FOLLOWING A MEAL What changed:  how much to take how to take this when to take this additional instructions   metroNIDAZOLE  500 MG tablet Commonly known as: FLAGYL  TAKE 1 TABLET BY MOUTH TWICE DAILY ON SUNDAY AND MONDAY. What changed: See the new instructions.   omeprazole 20 MG capsule Commonly known as: PRILOSEC Take 20 mg by mouth daily as needed (for heartburn- 30 minutes before a meal).   sacubitril -valsartan  24-26 MG Commonly known as: ENTRESTO  Take 1 tablet by mouth 2 (two) times daily.   Testosterone  1.62 % Gel APPLY 2 PUMPS DAILY What changed: See the new instructions.   testosterone  cypionate 200 MG/ML injection Commonly known as: DEPOTESTOSTERONE CYPIONATE Inject 1 mL (200 mg total) into the muscle every 28 (twenty-eight) days.        Allergies:  Allergies  Allergen Reactions   Other Other (See Comments)    Vicryl sutures - patient states that site becomes soupy   Iodinated Contrast Media Hives   Iodine Hives and Other (See Comments)    IVP contrast   Penicillins Itching, Rash and Other (See Comments)    ITCHY FEELING IN FINGERS; Tolerated Zosyn  04/2023    Family History: Family History  Problem Relation Age of Onset   Heart attack Brother    Stroke Brother    Aortic stenosis Mother    Arthritis Father     Social History:  reports that he has never smoked. He has never  used smokeless tobacco. He reports current alcohol  use of about 1.0 standard  drink of alcohol  per week. He reports that he does not use drugs.   Physical Exam: BP 138/81   Pulse 77   Ht 5' 5 (1.651 m)   Wt 149 lb 6 oz (67.8 kg)   BMI 24.86 kg/m   Constitutional:  Alert and oriented, No acute distress.  Accompanied by wife today. HEENT: [place] AT, moist mucus membranes.  Trachea midline, no masses. Neurologic: Grossly intact, no focal deficits, moving all 4 extremities. Psychiatric: Normal mood and affect.  Most recent CT scan was reviewed.  Interval decrease in prostate size with HoLEP defect appreciated.  No other pathology appreciated.  Assessment & Plan:    1. UTI with ESBL E.coli - Urinalysis today shows 6-10 white blood cells, indicating no active infection. - Follow-up with infectious disease is scheduled for Friday. - No suppression therapy is recommended at this time to preserve the efficacy of oral antibiotics like Macrobid for future use. - Encourage hydration and consider supplements like cranberry tablets and probiotics.  2. Stress incontinence - Reports ongoing stress incontinence but is emptying the bladder adequately. - Discussed the use of mechanical devices like clamps for incontinence management during physical activities. - Continue to monitor and manage symptoms conservatively. -PT  Return if symptoms worsen or fail to improve.  I have reviewed the above documentation for accuracy and completeness, and I agree with the above.   Rosina Riis, MD    [facility] ([facility] [address], [address] [place], [place] [address] [phone]

## 2023-05-15 ENCOUNTER — Ambulatory Visit: Payer: Medicare Other | Attending: Internal Medicine | Admitting: Internal Medicine

## 2023-05-15 ENCOUNTER — Encounter: Payer: Self-pay | Admitting: Internal Medicine

## 2023-05-15 ENCOUNTER — Inpatient Hospital Stay: Payer: Medicare Other | Admitting: Internal Medicine

## 2023-05-15 VITALS — BP 126/70 | HR 80 | Ht 65.0 in | Wt 149.8 lb

## 2023-05-15 DIAGNOSIS — I442 Atrioventricular block, complete: Secondary | ICD-10-CM | POA: Diagnosis not present

## 2023-05-15 DIAGNOSIS — I495 Sick sinus syndrome: Secondary | ICD-10-CM | POA: Diagnosis not present

## 2023-05-15 DIAGNOSIS — I5022 Chronic systolic (congestive) heart failure: Secondary | ICD-10-CM

## 2023-05-15 DIAGNOSIS — I4729 Other ventricular tachycardia: Secondary | ICD-10-CM | POA: Diagnosis not present

## 2023-05-15 DIAGNOSIS — I429 Cardiomyopathy, unspecified: Secondary | ICD-10-CM

## 2023-05-15 DIAGNOSIS — Z950 Presence of cardiac pacemaker: Secondary | ICD-10-CM | POA: Diagnosis not present

## 2023-05-15 DIAGNOSIS — I4819 Other persistent atrial fibrillation: Secondary | ICD-10-CM

## 2023-05-15 LAB — URINALYSIS, COMPLETE
Bilirubin, UA: NEGATIVE
Glucose, UA: NEGATIVE
Ketones, UA: NEGATIVE
Nitrite, UA: NEGATIVE
Protein,UA: NEGATIVE
Specific Gravity, UA: 1.015 (ref 1.005–1.030)
Urobilinogen, Ur: 0.2 mg/dL (ref 0.2–1.0)
pH, UA: 6 (ref 5.0–7.5)

## 2023-05-15 LAB — MICROSCOPIC EXAMINATION: Bacteria, UA: NONE SEEN

## 2023-05-15 LAB — PACEMAKER DEVICE OBSERVATION
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
## 2023-05-15 NOTE — Patient Instructions (Signed)
 Medication Instructions:  Your physician recommends that you continue on your current medications as directed. Please refer to the Current Medication list given to you today.  *If you need a refill on your cardiac medications before your next appointment, please call your pharmacy*   Lab Work: None ordered.  If you have labs (blood work) drawn today and your tests are completely normal, you will receive your results only by: MyChart Message (if you have MyChart) OR A paper copy in the mail If you have any lab test that is abnormal or we need to change your treatment, we will call you to review the results.   Testing/Procedures: None ordered.    Follow-Up: At [facility], you and your health needs are our priority.  As part of our continuing mission to provide you with exceptional heart care, we have created designated Provider Care Teams.  These Care Teams include your primary Cardiologist (physician) and Advanced Practice Providers (APPs -  Physician Assistants and Nurse Practitioners) who all work together to provide you with the care you need, when you need it.  We recommend signing up for the patient portal called "MyChart".  Sign up information is provided on this After Visit Summary.  MyChart is used to connect with patients for Virtual Visits (Telemedicine).  Patients are able to view lab/test results, encounter notes, upcoming appointments, etc.  Non-urgent messages can be sent to your provider as well.   To learn more about what you can do with MyChart, go to [email or web address].    Your next appointment:   6 months with Dr Graciela Husbands

## 2023-05-16 ENCOUNTER — Other Ambulatory Visit: Payer: Self-pay

## 2023-05-16 ENCOUNTER — Ambulatory Visit (INDEPENDENT_AMBULATORY_CARE_PROVIDER_SITE_OTHER): Payer: Medicare Other | Admitting: Internal Medicine

## 2023-05-16 ENCOUNTER — Encounter: Payer: Self-pay | Admitting: Internal Medicine

## 2023-05-16 VITALS — BP 148/75 | HR 80 | Ht 65.0 in | Wt 151.2 lb

## 2023-05-16 DIAGNOSIS — Z950 Presence of cardiac pacemaker: Secondary | ICD-10-CM | POA: Diagnosis not present

## 2023-05-16 DIAGNOSIS — N410 Acute prostatitis: Secondary | ICD-10-CM | POA: Diagnosis not present

## 2023-05-16 DIAGNOSIS — N390 Urinary tract infection, site not specified: Secondary | ICD-10-CM | POA: Diagnosis not present

## 2023-05-16 DIAGNOSIS — R7881 Bacteremia: Secondary | ICD-10-CM

## 2023-05-16 NOTE — Progress Notes (Signed)
 [facility]  Patient Active Problem List   Diagnosis Date Noted   AKI (acute kidney injury) (HCC) 04/29/2023   UTI (urinary tract infection) 04/29/2023   Chronic systolic heart failure (HCC) 11/25/2022   Sinus node dysfunction (HCC) 02/26/2022   Closed fracture of second lumbar vertebra (HCC) 09/26/2021   Dysphagia, unspecified 09/26/2021   Heart block 09/26/2021   Heartburn 09/26/2021   History of lumbar fusion 09/26/2021   Inflammation of sacroiliac joint (HCC) 09/26/2021   Other symptoms and signs involving cognitive functions and awareness 09/26/2021   Prediabetes 09/26/2021   Low back pain 09/26/2021   Renal cell carcinoma (HCC) 09/26/2021   Essential hypertension 09/26/2021   Cerebrovascular disease 09/26/2021   Transient cerebral ischemic attack, unspecified 09/26/2021   Cerebrovascular disease, unspecified 09/26/2021   Transient ischemic attack 09/26/2021   Unstable angina (HCC)    Lumbar burst fracture (HCC) 05/04/2020   Hypogonadism male 10/10/2019   Persistent atrial fibrillation (HCC) 01/12/2019   Acute low back pain 11/04/2017   Abnormal screening cardiac CT    Visit for monitoring Tikosyn  therapy 09/09/2017   History of stroke 08/29/2016   Paroxysmal atrial fibrillation (HCC) 07/23/2016   Coronary artery disease 07/23/2016   Snores 06/27/2016   Cardiac pacemaker in situ 06/25/2016   Hemiparesis (HCC) 06/25/2016   Acute ischemic stroke (HCC)    Acute left hemiparesis (HCC)    Hemisensory loss    Dysarthria    Stroke-like symptoms    Complete heart block (HCC)    Pain in thoracic spine    Orthostasis    Lethargy    Occlusion of vertebral artery    TIA (transient ischemic attack) 06/14/2016   Arthritis 02/06/2015   Esophageal reflux 02/06/2015   Arthritis urica 02/06/2015   Cannot sleep 02/06/2015   Arthritis, degenerative 02/06/2015   Adenocarcinoma, renal cell (HCC) 02/06/2015   Benign prostatic hyperplasia with  lower urinary tract symptoms 11/21/2014   Personal history of other malignant neoplasm of kidney 11/21/2014   History of Lyme disease 05/06/2014   Palpitations 12/31/2013   Hyperlipidemia 11/02/2010   HYPERTENSION, BENIGN 04/16/2010      Subjective:    Patient ID: Thomas [name], male    DOB: 07/09/1932, 89 y.o.   MRN: [id number]  No chief complaint on file.   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 He is here for hospital f/u   Patient had picc removed yesterday Doing well Wants repeat bcx next week (his cardiologist request) and he'll be going on fvacation 1[place]21 [place]  No complaint today  Allergies  Allergen Reactions   Other Other (See Comments)    Vicryl sutures - patient states that site becomes soupy   Iodinated Contrast Media Hives   Iodine Hives and Other (See Comments)    IVP contrast   Penicillins Itching, Rash and Other (See Comments)    ITCHY FEELING IN FINGERS; Tolerated Zosyn  04/2023      Outpatient Medications Prior to Visit  Medication Sig Dispense Refill   acetaminophen  (TYLENOL ) 500 MG tablet Take 500-1,000 mg by mouth every 6 (six) hours as  needed for mild pain (pain score 1-3) or fever.     apixaban  (ELIQUIS ) 2.5 MG TABS tablet Take 1 tablet (2.5 mg total) by mouth 2 (two) times daily. 90 tablet 3   Artificial Tears ophthalmic solution Place 1 drop into both eyes in the morning, at noon, in the evening, and at bedtime.     atorvastatin  (LIPITOR ) 40 MG tablet Take 1 tablet (40 mg total) by mouth daily. 90 tablet 3   buPROPion  (WELLBUTRIN  XL) 150 MG 24 hr tablet  Take 150 mg by mouth daily.     calcium  carbonate (TUMS - DOSED IN MG ELEMENTAL CALCIUM ) 500 MG chewable tablet Chew 1-2 tablets by mouth daily as needed for indigestion.     Coenzyme Q10 (COQ10) 100 MG CAPS Take 100 mg by mouth daily.     colchicine  0.6 MG tablet Take 0.6 mg by mouth daily as needed (for gout flares).     docusate sodium  (COLACE) 100 MG capsule Take 100 mg by mouth daily as needed for mild constipation.     dofetilide  (TIKOSYN ) 125 MCG capsule Take 1 capsule (125 mcg total) by mouth 2 (two) times daily. 180 capsule 2   DULoxetine  (CYMBALTA ) 20 MG capsule Take 20 mg by mouth 2 (two) times daily.     febuxostat  (ULORIC ) 40 MG tablet Take 1 tablet (40 mg total) by mouth daily. 90 tablet 3   gabapentin  (NEURONTIN ) 300 MG capsule Take 300 mg by mouth at bedtime.     hydrALAZINE  (APRESOLINE ) 25 MG tablet Take 1 tablet (25 mg total) by mouth as needed (for a Systolic reading of 160 that will not decrease after 30 minutes rest.). 30 tablet 0   methocarbamol  (ROBAXIN ) 500 MG tablet Take 500 mg by mouth every 6 (six) hours as needed for muscle spasms.     metoprolol  succinate (TOPROL -XL) 50 MG 24 hr tablet TAKE 1 TABLET BY MOUTH DAILY WITH OR IMMEDIATELY FOLLOWING A MEAL (Patient taking differently: Take 50 mg by mouth at bedtime.) 90 tablet 3   metroNIDAZOLE  (FLAGYL ) 500 MG tablet TAKE 1 TABLET BY MOUTH TWICE DAILY ON SUNDAY AND MONDAY. (Patient taking differently: Take 1,000 mg by mouth See admin instructions. Take 1,000 mg by mouth on Saturday and Sunday- EVERY OTHER WEEKEND) 90 tablet 3   NEEDLE, DISP, 18 G (BD DISP NEEDLES) 18G X 1-1/2 MISC 1 mg by Does not apply route every 14 (fourteen) days. 50 each 0   NEEDLE, DISP, 21 G (BD DISP NEEDLES) 21G X 1-1/2 MISC 1 mg by Does not apply route every 14 (fourteen) days. 50 each 0   omeprazole (PRILOSEC) 20 MG capsule Take 20 mg by mouth daily as needed (for heartburn- 30 minutes before a meal).     sacubitril -valsartan  (ENTRESTO ) 24-26 MG  Take 1 tablet by mouth 2 (two) times daily. 180 tablet 3   Syringe, Disposable, (2-3CC SYRINGE) 3 ML MISC 1 mg by Does not apply route every 14 (fourteen) days. 25 each 3   Testosterone  1.62 % GEL APPLY 2 PUMPS DAILY (Patient taking differently: Apply 2 Pump topically daily.) 75 g 5   testosterone  cypionate (DEPOTESTOSTERONE CYPIONATE) 200 MG/ML injection Inject 1 mL (200 mg total) into the muscle every 28 (twenty-eight) days. 10 mL 0   No facility-administered medications prior to visit.     Social History   Socioeconomic History   Marital status: Married    Spouse name: Not on file   Number of children: 3   Years of education: MD  Highest education level: Master's degree (e.g., MA, MS, MEng, MEd, MSW, MBA)  Occupational History   Occupation: Retired   Tobacco Use   Smoking status: Never   Smokeless tobacco: Never  Vaping Use   Vaping status: Never Used  Substance and Sexual Activity   Alcohol  use: Yes    Alcohol /week: 1.0 standard drink of alcohol     Types: 1 Glasses of wine per week   Drug use: No   Sexual activity: Yes  Other Topics Concern   Not on file  Social History Narrative   Independent at baseline. Lives with family   Drinks tea in the morning    Social Drivers of Health   Financial Resource Strain: Low Risk  (09/26/2021)   Overall Financial Resource Strain (CARDIA)    Difficulty of Paying Living Expenses: Not hard at all  Food Insecurity: No Food Insecurity (04/30/2023)   Hunger Vital Sign    Worried About Running Out of Food in the Last Year: Never true    Ran Out of Food in the Last Year: Never true  Transportation Needs: No Transportation Needs (04/30/2023)   PRAPARE - Administrator, Civil Service (Medical): No    Lack of Transportation (Non-Medical): No  Physical Activity: Sufficiently Active (09/26/2021)   Exercise Vital Sign    Days of Exercise per Week: 5 days    Minutes of Exercise per Session: 50 min  Stress: No Stress Concern  Present (09/26/2021)   [employer] of Occupational Health - Occupational Stress Questionnaire    Feeling of Stress : Not at all  Social Connections: Socially Integrated (09/26/2021)   Social Connection and Isolation Panel [NHANES]    Frequency of Communication with Friends and Family: Twice a week    Frequency of Social Gatherings with Friends and Family: Twice a week    Attends Religious Services: 1 to 4 times per year    Active Member of [employer] or Organizations: Yes    Attends Engineer, Structural: More than 4 times per year    Marital Status: Married  Catering Manager Violence: Not At Risk (04/30/2023)   Humiliation, Afraid, Rape, and Kick questionnaire    Fear of Current or Ex-Partner: No    Emotionally Abused: No    Physically Abused: No    Sexually Abused: No      Review of Systems    All other ros negative  Objective:    BP (!) 148/75   Pulse 80   Ht 5' 5 (1.651 m)   Wt 151 lb 3.2 oz (68.6 kg)   BMI 25.16 kg/m  Nursing note and vital signs reviewed.  Physical Exam     General/constitutional: no distress, pleasant HEENT: Normocephalic, PER, Conj Clear, EOMI, Oropharynx clear Neck supple CV: rrr no mrg Lungs: clear to auscultation, normal respiratory effort Abd: Soft, Nontender Ext: no edema Skin: bruising right arm prior peripheral iv; left arm dressing intact for prior picc Neuro: nonfocal MSK: no peripheral joint swelling/tenderness/warmth; back spines nontender    Labs: Lab Results  Component Value Date   WBC 6.7 05/04/2023   HGB 13.4 05/04/2023   HCT 41.8 05/04/2023   MCV 90.7 05/04/2023   PLT 182 05/04/2023   Last metabolic panel Lab Results  Component Value Date   GLUCOSE 91 05/05/2023   NA 134 (L) 05/05/2023   K 4.1 05/05/2023   CL 101 05/05/2023   CO2 25 05/05/2023   BUN 26 (H) 05/05/2023   CREATININE 1.57 (H) 05/05/2023  GFRNONAA 42 (L) 05/05/2023   CALCIUM  8.8 (L) 05/05/2023   PHOS 2.9 05/04/2023   PROT 6.7  04/29/2023   ALBUMIN  3.1 (L) 05/04/2023   LABGLOB 2.3 01/09/2022   AGRATIO 2.0 01/09/2022   BILITOT 0.8 04/29/2023   ALKPHOS 55 04/29/2023   AST 15 04/29/2023   ALT 10 04/29/2023   ANIONGAP 8 05/05/2023    Micro:  Serology:  Imaging: Reviewed   04/29/23 ct abd pelv without contrast 1. Nonobstructing stone in the left kidney. 2. Mild bladder wall thickening with stranding around the ureters possibly indicating cystitis with pyelonephritis. No abscess. 3. Cholelithiasis without evidence of acute cholecystitis. 4. Old fracture deformity at L2 with posterior fixation.  Assessment & Plan:   Problem List Items Addressed This Visit       Genitourinary   UTI (urinary tract infection)   Other Visit Diagnoses       Acute prostatitis    -  Primary     Bacteremia             No orders of the defined types were placed in this encounter.    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 He is here for hospital f/u     12/24 bcx esbl ecoli 12/24 ucx ecoli 12/26 bcx repeat negative    Urology following and concerned for prostatitis   Responded well to abx    Presence of pacemaker -- repeat bcx negative. No suspicion for pacer infection   Discharged on 2 weeks of ertapnem which he finished 05/14/23    05/16/23 doing well no sign of active infection but finished abx 2 days ago His cardiologist request repeat bcx next week and we have ordered   F/u as needed if fever, chill, sx of uti or joint/back pain, dyspnea/cough/sob     Follow-up: Return if symptoms worsen or fail to improve.      Constance ONEIDA Passer,  MD [facility] 05/16/2023, 11:00 AM

## 2023-05-16 NOTE — Patient Instructions (Signed)
 Please do blood tests on Friday morning before 11 and we can discuss if it is positive what to do   You appear to be doing well and infection resolved at this time

## 2023-05-19 ENCOUNTER — Encounter: Payer: Medicare Other | Attending: Cardiovascular Disease | Admitting: *Deleted

## 2023-05-19 VITALS — Ht 65.5 in | Wt 148.2 lb

## 2023-05-19 DIAGNOSIS — I5022 Chronic systolic (congestive) heart failure: Secondary | ICD-10-CM | POA: Diagnosis not present

## 2023-05-19 NOTE — Progress Notes (Signed)
 Cardiac Individual Treatment Plan  Patient Details  Name: Thomas [name] MRN: [id number] Date of Birth: Aug 29, 1932 Referring Provider:   Flowsheet Row Cardiac Rehab from 05/19/2023 in [facility] Cardiac and Pulmonary Rehab  Referring Provider Dr. [name]       Initial Encounter Date:  Flowsheet Row Cardiac Rehab from 05/19/2023 in [facility] Cardiac and Pulmonary Rehab  Date 05/19/23       Visit Diagnosis: Heart failure, chronic systolic (HCC)  Patient's Home Medications on Admission:  Current Outpatient Medications:    acetaminophen  (TYLENOL ) 500 MG tablet, Take 500-1,000 mg by mouth every 6 (six) hours as needed for mild pain (pain score 1-3) or fever., Disp: , Rfl:    apixaban  (ELIQUIS ) 2.5 MG TABS tablet, Take 1 tablet (2.5 mg total) by mouth 2 (two) times daily., Disp: 90 tablet, Rfl: 3   Artificial Tears ophthalmic solution, Place 1 drop into both eyes in the morning, at noon, in the evening, and at bedtime., Disp: , Rfl:    atorvastatin  (LIPITOR ) 40 MG tablet, Take 1 tablet (40 mg total) by mouth daily., Disp: 90 tablet, Rfl: 3   buPROPion  (WELLBUTRIN  XL) 150 MG 24 hr tablet, Take 150 mg by mouth daily., Disp: , Rfl:    calcium  carbonate (TUMS - DOSED IN MG ELEMENTAL CALCIUM ) 500 MG chewable tablet, Chew 1-2 tablets by mouth daily as needed for indigestion., Disp: , Rfl:    Coenzyme Q10 (COQ10) 100 MG CAPS, Take 100 mg by mouth daily., Disp: , Rfl:    colchicine  0.6 MG tablet, Take 0.6 mg by mouth daily as needed (for gout flares)., Disp: , Rfl:    docusate sodium  (COLACE) 100 MG capsule, Take 100 mg by mouth daily as needed for mild constipation., Disp: , Rfl:    dofetilide  (TIKOSYN ) 125 MCG capsule, Take 1 capsule (125 mcg total) by mouth 2 (two) times daily., Disp: 180 capsule, Rfl: 2   DULoxetine  (CYMBALTA ) 20 MG capsule, Take 20 mg by mouth 2 (two) times daily., Disp: , Rfl:    febuxostat  (ULORIC ) 40 MG tablet, Take 1 tablet (40 mg total) by mouth daily., Disp: 90 tablet, Rfl:  3   gabapentin  (NEURONTIN ) 300 MG capsule, Take 300 mg by mouth at bedtime., Disp: , Rfl:    hydrALAZINE  (APRESOLINE ) 25 MG tablet, Take 1 tablet (25 mg total) by mouth as needed (for a Systolic reading of 160 that will not decrease after 30 minutes rest.)., Disp: 30 tablet, Rfl: 0   methocarbamol  (ROBAXIN ) 500 MG tablet, Take 500 mg by mouth every 6 (six) hours as needed for muscle spasms., Disp: , Rfl:    metoprolol  succinate (TOPROL -XL) 50 MG 24 hr tablet, TAKE 1 TABLET BY MOUTH DAILY WITH OR IMMEDIATELY FOLLOWING A MEAL (Patient taking differently: Take 50 mg by mouth at bedtime.), Disp: 90 tablet, Rfl: 3   metroNIDAZOLE  (FLAGYL ) 500 MG tablet, TAKE 1 TABLET BY MOUTH TWICE DAILY ON SUNDAY AND MONDAY. (Patient taking differently: Take 1,000 mg by mouth See admin instructions. Take 1,000 mg by mouth on Saturday and Sunday- EVERY OTHER WEEKEND), Disp: 90 tablet, Rfl: 3   NEEDLE, DISP, 18 G (BD DISP NEEDLES) 18G X 1-1/2 MISC, 1 mg by Does not apply route every 14 (fourteen) days., Disp: 50 each, Rfl: 0   NEEDLE, DISP, 21 G (BD DISP NEEDLES) 21G X 1-1/2 MISC, 1 mg by Does not apply route every 14 (fourteen) days., Disp: 50 each, Rfl: 0   omeprazole (PRILOSEC) 20 MG capsule, Take 20 mg by  mouth daily as needed (for heartburn- 30 minutes before a meal)., Disp: , Rfl:    sacubitril -valsartan  (ENTRESTO ) 24-26 MG, Take 1 tablet by mouth 2 (two) times daily., Disp: 180 tablet, Rfl: 3   Syringe, Disposable, (2-3CC SYRINGE) 3 ML MISC, 1 mg by Does not apply route every 14 (fourteen) days., Disp: 25 each, Rfl: 3   Testosterone  1.62 % GEL, APPLY 2 PUMPS DAILY (Patient taking differently: Apply 2 Pump topically daily.), Disp: 75 g, Rfl: 5   testosterone  cypionate (DEPOTESTOSTERONE CYPIONATE) 200 MG/ML injection, Inject 1 mL (200 mg total) into the muscle every 28 (twenty-eight) days., Disp: 10 mL, Rfl: 0  Past Medical History: Past Medical History:  Diagnosis Date   Aortic valve disorders    Arthritis     Atrial flutter (HCC) 06/18/2016   AF or AFl; not sure which (06/23/2016)   Basal cell carcinoma    face, nose left shoulder, left arm (06/19/2016)   Basal cell carcinoma 09/13/2020   right temple   BBB (bundle branch block)    hx right   Chronic back pain    neck, thoracic, lower back (06/19/2016)   Complete heart block (HCC) 06/2016   Dyspnea    GERD (gastroesophageal reflux disease)    Gout    Heart block    I've had type I, II Wenke before now (06/19/2016)   History of gout    History of hiatal hernia    self dx'd (06/19/2016)   Hyperlipidemia    Hypertension    Lyme disease    dx'd by me 2003; cx's showed dx 08/2015   Migraine    3-4/year (06/19/2016)   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   PVC's (premature ventricular contractions)    Renal cancer, left (HCC) 2006   S/P cryotherapy   Spinal stenosis    cervical, 1 thoracic, lumbar (06/19/2016)   Squamous carcinoma    face, nose left shoulder, left arm (06/19/2016)   Stroke (HCC)    TIA (transient ischemic attack) 06/14/2016   I'm not sure that's what it was (06/25/2016)   Visit for monitoring Tikosyn  therapy 09/09/2017    Tobacco Use: Social History   Tobacco Use  Smoking Status Never  Smokeless Tobacco Never    Labs: Review Flowsheet  More data exists      Latest Ref Rng & Units 02/07/2020 11/01/2020 08/21/2021 01/09/2022 11/25/2022  Labs for ITP Cardiac and Pulmonary Rehab  Cholestrol 100 - 199 mg/dL 859  874  - 866  -  LDL (calc) 0 - 99 mg/dL 76  70  - 56  -  HDL-C >39 mg/dL 53  41  - 67  -  Trlycerides 0 - 149 mg/dL 47  69  - 39  -  Hemoglobin A1c 4.8 - 5.6 % - - 5.7  5.7  -  PH, Arterial 7.35 - 7.45 - - - - 7.376   PCO2 arterial 32 - 48 mmHg - - - - 41.7   Bicarbonate 20.0 - 28.0 mmol/L - - - - 25.5  24.4   TCO2 22 - 32 mmol/L - - - - 27  26   Acid-base deficit 0.0 - 2.0 mmol/L - - - - 1.0   O2 Saturation % - - - - 70  88     Details        Multiple values from one day are sorted in reverse-chronological order  Exercise Target Goals: Exercise Program Goal: Individual exercise prescription set using results from initial 6 min walk test and THRR while considering  patient's activity barriers and safety.   Exercise Prescription Goal: Initial exercise prescription builds to 30-45 minutes a day of aerobic activity, 2-3 days per week.  Home exercise guidelines will be given to patient during program as part of exercise prescription that the participant will acknowledge.   Education: Aerobic Exercise: - Group verbal and visual presentation on the components of exercise prescription. Introduces F.I.T.T principle from ACSM for exercise prescriptions.  Reviews F.I.T.T. principles of aerobic exercise including progression. Written material given at graduation.   Education: Resistance Exercise: - Group verbal and visual presentation on the components of exercise prescription. Introduces F.I.T.T principle from ACSM for exercise prescriptions  Reviews F.I.T.T. principles of resistance exercise including progression. Written material given at graduation.    Education: Exercise & Equipment Safety: - Individual verbal instruction and demonstration of equipment use and safety with use of the equipment. Flowsheet Row Cardiac Rehab from 05/19/2023 in [facility] Cardiac and Pulmonary Rehab  Date 05/19/23  Educator [facility]  Instruction Review Code 1- Verbalizes Understanding       Education: Exercise Physiology & General Exercise Guidelines: - Group verbal and written instruction with models to review the exercise physiology of the cardiovascular system and associated critical values. Provides general exercise guidelines with specific guidelines to those with heart or lung disease.    Education: Flexibility, Balance, Mind/Body Relaxation: - Group verbal and visual presentation with interactive activity on the components of exercise  prescription. Introduces F.I.T.T principle from ACSM for exercise prescriptions. Reviews F.I.T.T. principles of flexibility and balance exercise training including progression. Also discusses the mind body connection.  Reviews various relaxation techniques to help reduce and manage stress (i.e. Deep breathing, progressive muscle relaxation, and visualization). Balance handout provided to take home. Written material given at graduation.   Activity Barriers & Risk Stratification:  Activity Barriers & Cardiac Risk Stratification - 05/19/23 1526       Activity Barriers & Cardiac Risk Stratification   Activity Barriers Back Problems;Muscular Weakness    Cardiac Risk Stratification High             6 Minute Walk:  6 Minute Walk     Row Name 05/19/23 1525         6 Minute Walk   Phase Initial     Distance 1360 feet     Walk Time 6 minutes     # of Rest Breaks 0     MPH 2.58     METS 2.34     RPE 11     Perceived Dyspnea  0     VO2 Peak 8.21     Symptoms No     Resting HR 84 bpm     Resting BP 118/78     Resting Oxygen Saturation  98 %     Exercise Oxygen Saturation  during 6 min walk 92 %     Max Ex. HR 117 bpm     Max Ex. BP 132/70     2 Minute Post BP 120/80              Oxygen Initial Assessment:   Oxygen Re-Evaluation:   Oxygen Discharge (Final Oxygen Re-Evaluation):   Initial Exercise Prescription:  Initial Exercise Prescription - 05/19/23 1500       Date of Initial Exercise RX and Referring Provider   Date 05/19/23    Referring Provider Dr.  [name]      Oxygen   Maintain Oxygen Saturation 88% or higher      Treadmill   MPH 1.8    Grade 1    Minutes 15    METs 2.63      Recumbant Bike   Level 3    RPM 50    Watts 20    Minutes 15    METs 2.34      NuStep   Level 3    SPM 80    Minutes 15    METs 2.34      REL-XR   Level 3    Speed 50    Minutes 15    METs 2.34      Biostep-RELP   Level 3    SPM 50    Minutes 15     METs 2.34      Prescription Details   Frequency (times per week) 3    Duration Progress to 30 minutes of continuous aerobic without signs/symptoms of physical distress      Intensity   THRR 40-80% of Max Heartrate 103-122    Ratings of Perceived Exertion 11-13    Perceived Dyspnea 0-4      Progression   Progression Continue to progress workloads to maintain intensity without signs/symptoms of physical distress.      Resistance Training   Training Prescription Yes    Weight 7    Reps 10-15             Perform Capillary Blood Glucose checks as needed.  Exercise Prescription Changes:   Exercise Prescription Changes     Row Name 05/19/23 1500             Response to Exercise   Blood Pressure (Admit) 118/78       Blood Pressure (Exercise) 132/70       Blood Pressure (Exit) 120/80       Heart Rate (Admit) 84 bpm       Heart Rate (Exercise) 117 bpm       Heart Rate (Exit) 66 bpm       Oxygen Saturation (Admit) 98 %       Oxygen Saturation (Exercise) 92 %       Oxygen Saturation (Exit) 99 %       Rating of Perceived Exertion (Exercise) 11       Perceived Dyspnea (Exercise) 0       Symptoms none       Comments 6 MWT results                Exercise Comments:   Exercise Goals and Review:   Exercise Goals     Row Name 05/19/23 1530             Exercise Goals   Increase Physical Activity Yes       Intervention Provide advice, education, support and counseling about physical activity/exercise needs.;Develop an individualized exercise prescription for aerobic and resistive training based on initial evaluation findings, risk stratification, comorbidities and participant's personal goals.       Expected Outcomes Short Term: Attend rehab on a regular basis to increase amount of physical activity.;Long Term: Add in home exercise to make exercise part of routine and to increase amount of physical activity.;Long Term: Exercising regularly at least 3-5 days a week.        Increase Strength and Stamina Yes       Intervention Provide advice, education, support and counseling about  physical activity/exercise needs.;Develop an individualized exercise prescription for aerobic and resistive training based on initial evaluation findings, risk stratification, comorbidities and participant's personal goals.       Expected Outcomes Short Term: Increase workloads from initial exercise prescription for resistance, speed, and METs.;Short Term: Perform resistance training exercises routinely during rehab and add in resistance training at home;Long Term: Improve cardiorespiratory fitness, muscular endurance and strength as measured by increased METs and functional capacity ( )       Able to understand and use rate of perceived exertion (RPE) scale Yes       Intervention Provide education and explanation on how to use RPE scale       Expected Outcomes Short Term: Able to use RPE daily in rehab to express subjective intensity level;Long Term:  Able to use RPE to guide intensity level when exercising independently       Able to understand and use Dyspnea scale Yes       Intervention Provide education and explanation on how to use Dyspnea scale       Expected Outcomes Short Term: Able to use Dyspnea scale daily in rehab to express subjective sense of shortness of breath during exertion;Long Term: Able to use Dyspnea scale to guide intensity level when exercising independently       Knowledge and understanding of Target Heart Rate Range (THRR) Yes       Intervention Provide education and explanation of THRR including how the numbers were predicted and where they are located for reference       Expected Outcomes Short Term: Able to state/look up THRR;Long Term: Able to use THRR to govern intensity when exercising independently;Short Term: Able to use daily as guideline for intensity in rehab       Able to check pulse independently Yes       Intervention Provide education and  demonstration on how to check pulse in carotid and radial arteries.;Review the importance of being able to check your own pulse for safety during independent exercise       Expected Outcomes Short Term: Able to explain why pulse checking is important during independent exercise;Long Term: Able to check pulse independently and accurately       Understanding of Exercise Prescription Yes       Intervention Provide education, explanation, and written materials on patient's individual exercise prescription       Expected Outcomes Short Term: Able to explain program exercise prescription;Long Term: Able to explain home exercise prescription to exercise independently                Exercise Goals Re-Evaluation :   Discharge Exercise Prescription (Final Exercise Prescription Changes):  Exercise Prescription Changes - 05/19/23 1500       Response to Exercise   Blood Pressure (Admit) 118/78    Blood Pressure (Exercise) 132/70    Blood Pressure (Exit) 120/80    Heart Rate (Admit) 84 bpm    Heart Rate (Exercise) 117 bpm    Heart Rate (Exit) 66 bpm    Oxygen Saturation (Admit) 98 %    Oxygen Saturation (Exercise) 92 %    Oxygen Saturation (Exit) 99 %    Rating of Perceived Exertion (Exercise) 11    Perceived Dyspnea (Exercise) 0    Symptoms none    Comments 6 MWT results             Nutrition:  Target Goals: Understanding of nutrition guidelines, daily intake of sodium 1500mg , cholesterol 200mg , calories 30%  from fat and 7% or less from saturated fats, daily to have 5 or more servings of fruits and vegetables.  Education: All About Nutrition: -Group instruction provided by verbal, written material, interactive activities, discussions, models, and posters to present general guidelines for heart healthy nutrition including fat, fiber, MyPlate, the role of sodium in heart healthy nutrition, utilization of the nutrition label, and utilization of this knowledge for meal planning. Follow  up email sent as well. Written material given at graduation.   Biometrics:  Pre Biometrics - 05/19/23 1531       Pre Biometrics   Height 5' 5.5 (1.664 m)    Weight 148 lb 3.2 oz (67.2 kg)    Waist Circumference 35 inches    Hip Circumference 38 inches    Waist to Hip Ratio 0.92 %    BMI (Calculated) 24.28    Single Leg Stand 2.47 seconds              Nutrition Therapy Plan and Nutrition Goals:  Nutrition Therapy & Goals - 04/21/23 1447       Nutrition Therapy   Diet has auses with his meds which causes trouble eating at times             Nutrition Assessments:  MEDIFICTS Score Key: >=70 Need to make dietary changes  40-70 Heart Healthy Diet <= 40 Therapeutic Level Cholesterol Diet   Picture Your Plate Scores: <59 Unhealthy dietary pattern with much room for improvement. 41-50 Dietary pattern unlikely to meet recommendations for good health and room for improvement. 51-60 More healthful dietary pattern, with some room for improvement.  >60 Healthy dietary pattern, although there may be some specific behaviors that could be improved.    Nutrition Goals Re-Evaluation:   Nutrition Goals Discharge (Final Nutrition Goals Re-Evaluation):   Psychosocial: Target Goals: Acknowledge presence or absence of significant depression and/or stress, maximize coping skills, provide positive support system. Participant is able to verbalize types and ability to use techniques and skills needed for reducing stress and depression.   Education: Stress, Anxiety, and Depression - Group verbal and visual presentation to define topics covered.  Reviews how body is impacted by stress, anxiety, and depression.  Also discusses healthy ways to reduce stress and to treat/manage anxiety and depression.  Written material given at graduation.   Education: Sleep Hygiene -Provides group verbal and written instruction about how sleep can affect your health.  Define sleep hygiene, discuss  sleep cycles and impact of sleep habits. Review good sleep hygiene tips.    Initial Review & Psychosocial Screening:  Initial Psych Review & Screening - 04/21/23 1417       Initial Review   Current issues with Current Psychotropic Meds      Family Dynamics   Concerns Recent loss of significant other    Comments loss of 4 significant family members in the last year      Barriers   Psychosocial barriers to participate in program There are no identifiable barriers or psychosocial needs.;The patient should benefit from training in stress management and relaxation.      Screening Interventions   Interventions Encouraged to exercise;To provide support and resources with identified psychosocial needs;Provide feedback about the scores to participant    Expected Outcomes Short Term goal: Utilizing psychosocial counselor, staff and physician to assist with identification of specific Stressors or current issues interfering with healing process. Setting desired goal for each stressor or current issue identified.;Long Term Goal: Stressors or current issues are controlled or  eliminated.;Short Term goal: Identification and review with participant of any Quality of Life or Depression concerns found by scoring the questionnaire.;Long Term goal: The participant improves quality of Life and PHQ9 Scores as seen by post scores and/or verbalization of changes             Quality of Life Scores:   Quality of Life - 05/19/23 1533       Quality of Life   Select Quality of Life      Quality of Life Scores   Health/Function Pre 24.43 %    Socioeconomic Pre 29.58 %    Psych/Spiritual Pre 24 %    Family Pre 30 %    GLOBAL Pre 26.12 %            Scores of 19 and below usually indicate a poorer quality of life in these areas.  A difference of  2-3 points is a clinically meaningful difference.  A difference of 2-3 points in the total score of the Quality of Life Index has been associated with  significant improvement in overall quality of life, self-image, physical symptoms, and general health in studies assessing change in quality of life.  PHQ-9: Review Flowsheet  More data exists      05/19/2023 01/09/2022 09/26/2021 08/21/2021 10/17/2020  Depression screen PHQ 2/9  Decreased Interest 1 0 0 0 0  Down, Depressed, Hopeless 0 0 0 0 0  PHQ - 2 Score 1 0 0 0 0  Altered sleeping 1 0 0 1 0  Tired, decreased energy 1 1 0 2 1  Change in appetite 1 0 0 0 1  Feeling bad or failure about yourself  0 0 0 0 0  Trouble concentrating 0 0 0 0 0  Moving slowly or fidgety/restless 0 0 0 0 0  Suicidal thoughts 0 0 0 0 0  PHQ-9 Score 4 1 0 3 2  Difficult doing work/chores Somewhat difficult Not difficult at all Not difficult at all Not difficult at all Not difficult at all   Interpretation of Total Score  Total Score Depression Severity:  1-4 = Minimal depression, 5-9 = Mild depression, 10-14 = Moderate depression, 15-19 = Moderately severe depression, 20-27 = Severe depression   Psychosocial Evaluation and Intervention:  Psychosocial Evaluation - 04/21/23 1442       Psychosocial Evaluation & Interventions   Interventions Encouraged to exercise with the program and follow exercise prescription    Comments Thomas [name] has no barriers to attending the program. He is ready to start and get back to his exercise routine. He lives with his wife and he has her as his support.   He has loss 4 siginificant members of his family in the past year.   He does take wellbutrin  and cymbalta ,pres[facility] by [facility] for brain fog, the meds are helping as he goes through with all his loss this year.    Expected Outcomes STG attends all scheduled sessions, continues with his meds as prescribed to help  deal with his life changes.  LTG Able to continue to manage with his meds as prescribed    Continue Psychosocial Services  Follow up required by staff             Psychosocial Re-Evaluation:   Psychosocial  Discharge (Final Psychosocial Re-Evaluation):   Vocational Rehabilitation: Provide vocational rehab assistance to qualifying candidates.   Vocational Rehab Evaluation & Intervention:   Education: Education Goals: Education classes will be provided on a variety of topics geared  toward better understanding of heart health and risk factor modification. Participant will state understanding/return demonstration of topics presented as noted by education test scores.  Learning Barriers/Preferences:   General Cardiac Education Topics:  AED/CPR: - Group verbal and written instruction with the use of models to demonstrate the basic use of the AED with the basic ABC's of resuscitation.   Anatomy and Cardiac Procedures: - Group verbal and visual presentation and models provide information about basic cardiac anatomy and function. Reviews the testing methods done to diagnose heart disease and the outcomes of the test results. Describes the treatment choices: Medical Management, Angioplasty, or Coronary Bypass Surgery for treating various heart conditions including Myocardial Infarction, Angina, Valve Disease, and Cardiac Arrhythmias.  Written material given at graduation.   Medication Safety: - Group verbal and visual instruction to review commonly prescribed medications for heart and lung disease. Reviews the medication, class of the drug, and side effects. Includes the steps to properly store meds and maintain the prescription regimen.  Written material given at graduation.   Intimacy: - Group verbal instruction through game format to discuss how heart and lung disease can affect sexual intimacy. Written material given at graduation..   Know Your Numbers and Heart Failure: - Group verbal and visual instruction to discuss disease risk factors for cardiac and pulmonary disease and treatment options.  Reviews associated critical values for Overweight/Obesity, Hypertension, Cholesterol, and  Diabetes.  Discusses basics of heart failure: signs/symptoms and treatments.  Introduces Heart Failure Zone chart for action plan for heart failure.  Written material given at graduation. Flowsheet Row Cardiac Rehab from 05/19/2023 in [facility] Cardiac and Pulmonary Rehab  Education need identified 05/19/23       Infection Prevention: - Provides verbal and written material to individual with discussion of infection control including proper hand washing and proper equipment cleaning during exercise session. Flowsheet Row Cardiac Rehab from 05/19/2023 in [facility] Cardiac and Pulmonary Rehab  Date 05/19/23  Educator [facility]  Instruction Review Code 1- Verbalizes Understanding       Falls Prevention: - Provides verbal and written material to individual with discussion of falls prevention and safety. Flowsheet Row Cardiac Rehab from 05/19/2023 in [facility] Cardiac and Pulmonary Rehab  Date 05/19/23  Educator [facility] [facility]  Instruction Review Code 1- Verbalizes Understanding       Other: -Provides group and verbal instruction on various topics (see comments)   Knowledge Questionnaire Score:  Knowledge Questionnaire Score - 05/19/23 1533       Knowledge Questionnaire Score   Pre Score 24/26             Core Components/Risk Factors/Patient Goals at Admission:  Personal Goals and Risk Factors at Admission - 05/19/23 1532       Core Components/Risk Factors/Patient Goals on Admission    Weight Management Yes    Intervention Weight Management: Provide education and appropriate resources to help participant work on and attain dietary goals.;Weight Management: Develop a combined nutrition and exercise program designed to reach desired caloric intake, while maintaining appropriate intake of nutrient and fiber, sodium and fats, and appropriate energy expenditure required for the weight goal.    Admit Weight 148 lb 3.2 oz (67.2 kg)    Goal Weight: Short Term 148 lb (67.1 kg)    Goal Weight: Long Term 148 lb (67.1  kg)    Expected Outcomes Short Term: Continue to assess and modify interventions until short term weight is achieved;Long Term: Adherence to nutrition and physical activity/exercise program aimed toward attainment of  established weight goal;Weight Maintenance: Understanding of the daily nutrition guidelines, which includes 25-35% calories from fat, 7% or less cal from saturated fats, less than 200mg  cholesterol, less than 1.5gm of sodium, & 5 or more servings of fruits and vegetables daily;Understanding recommendations for meals to include 15-35% energy as protein, 25-35% energy from fat, 35-60% energy from carbohydrates, less than 200mg  of dietary cholesterol, 20-35 gm of total fiber daily;Understanding of distribution of calorie intake throughout the day with the consumption of 4-5 meals/snacks    Heart Failure Yes    Intervention Provide a combined exercise and nutrition program that is supplemented with education, support and counseling about heart failure. Directed toward relieving symptoms such as shortness of breath, decreased exercise tolerance, and extremity edema.    Expected Outcomes Improve functional capacity of life;Short term: Attendance in program 2-3 days a week with increased exercise capacity. Reported lower sodium intake. Reported increased fruit and vegetable intake. Reports medication compliance.;Short term: Daily weights obtained and reported for increase. Utilizing diuretic protocols set by physician.;Long term: Adoption of self-care skills and reduction of barriers for early signs and symptoms recognition and intervention leading to self-care maintenance.    Hypertension Yes    Intervention Provide education on lifestyle modifcations including regular physical activity/exercise, weight management, moderate sodium restriction and increased consumption of fresh fruit, vegetables, and low fat dairy, alcohol  moderation, and smoking cessation.;Monitor prescription use compliance.     Expected Outcomes Short Term: Continued assessment and intervention until BP is < 140/33mm HG in hypertensive participants. < 130/80mm HG in hypertensive participants with diabetes, heart failure or chronic kidney disease.;Long Term: Maintenance of blood pressure at goal levels.    Lipids Yes    Intervention Provide education and support for participant on nutrition & aerobic/resistive exercise along with prescribed medications to achieve LDL 70mg , HDL >40mg .    Expected Outcomes Short Term: Participant states understanding of desired cholesterol values and is compliant with medications prescribed. Participant is following exercise prescription and nutrition guidelines.;Long Term: Cholesterol controlled with medications as prescribed, with individualized exercise RX and with personalized nutrition plan. Value goals: LDL < 70mg , HDL > 40 mg.             Education:Diabetes - Individual verbal and written instruction to review signs/symptoms of diabetes, desired ranges of glucose level fasting, after meals and with exercise. Acknowledge that pre and post exercise glucose checks will be done for 3 sessions at entry of program.   Core Components/Risk Factors/Patient Goals Review:    Core Components/Risk Factors/Patient Goals at Discharge (Final Review):    ITP Comments:  ITP Comments     Row Name 04/21/23 1442 05/19/23 1524         ITP Comments Virtual orientation call completed today. he has an appointment on Date: 04/24/2023  for EP eval and gym Orientation.  Documentation of diagnosis can be found in [facility] Date: 04/11/2023 . Completed and gym orientation. Initial ITP created and sent for review to Dr. Oneil Pinal, Medical Director.               Comments: initial ITP

## 2023-05-19 NOTE — Progress Notes (Signed)
 Assessment start time: 2:56 PM  Digestive issues/concerns: no known food allergies  24-hours Recall: B: cups of tea, bran buds, fruit berries, protein powder L: cheese sandwich Snack: banana  D: tuna, green peas  Beverages water (~48oz), cup of coffee Alcohol  1-2 glasses a week Caffeine coffee  Education r/t nutrition plan Patient recovering from hospital stay and reports appetite has improved a lot since his inpatient stay, says it could still use more improvement. Spoke today about ways to meet nutritional needs with less quantity of foods, since her reports some nausea and early satiety. Encouraged him to eat a quality protein ~15-20g per meal paired with a complex carb. Reviewed some good pairings and answered questions around protein, types of fats, sodium, and sugar. Reviewed a few facts labels and provided advice on how to read labels and determine if the foods are valuable for his nutritional goals. Encouraged efficient foods like beans, lentils, and hummus. Healthy fat rich foods like peanuts, peanut butter, fish, and olive oil to increase calories with less quantity of food. Wanted to set goal of ~64oz of water daily, reminded him to ensure water intake doesn't further reduce food intake, suggested he sip on water between meal and keep water at meal to what is needed to enjoy the meal and ensure safe swallowing when appetite is poor. He is optimistic that his appetite will improve and he will be able to eat more normally. Will continue to monitor and support   Goal 1: Drink 64oz of water daily Goal 2: Eat 15-20gProtein and 30-60gCarbs at each meal. Goal 3: Read labels and reduce sodium intake to below 2300mg . Ideally 1500mg  per day.   End time 3:48 PM

## 2023-05-19 NOTE — Patient Instructions (Signed)
 Patient Instructions  Patient Details  Name: Thomas [name] MRN: [id number] Date of Birth: Mar 07, 1933 Referring Provider:  Darron Deatrice LABOR, MD  Below are your personal goals for exercise, nutrition, and risk factors. Our goal is to help you stay on track towards obtaining and maintaining these goals. We will be discussing your progress on these goals with you throughout the program.  Initial Exercise Prescription:  Initial Exercise Prescription - 05/19/23 1500       Date of Initial Exercise RX and Referring Provider   Date 05/19/23    Referring Provider Dr. Deatrice Darron      Oxygen   Maintain Oxygen Saturation 88% or higher      Treadmill   MPH 1.8    Grade 1    Minutes 15    METs 2.63      Recumbant Bike   Level 3    RPM 50    Watts 20    Minutes 15    METs 2.34      NuStep   Level 3    SPM 80    Minutes 15    METs 2.34      REL-XR   Level 3    Speed 50    Minutes 15    METs 2.34      Biostep-RELP   Level 3    SPM 50    Minutes 15    METs 2.34      Prescription Details   Frequency (times per week) 3    Duration Progress to 30 minutes of continuous aerobic without signs/symptoms of physical distress      Intensity   THRR 40-80% of Max Heartrate 103-122    Ratings of Perceived Exertion 11-13    Perceived Dyspnea 0-4      Progression   Progression Continue to progress workloads to maintain intensity without signs/symptoms of physical distress.      Resistance Training   Training Prescription Yes    Weight 7    Reps 10-15             Exercise Goals: Frequency: Be able to perform aerobic exercise two to three times per week in program working toward 2-5 days per week of home exercise.  Intensity: Work with a perceived exertion of 11 (fairly light) - 15 (hard) while following your exercise prescription.  We will make changes to your prescription with you as you progress through the program.   Duration: Be able to do 30 to 45 minutes of  continuous aerobic exercise in addition to a 5 minute warm-up and a 5 minute cool-down routine.   Nutrition Goals: Your personal nutrition goals will be established when you do your nutrition analysis with the dietician.  The following are general nutrition guidelines to follow: Cholesterol < 200mg /day Sodium < 1500mg /day Fiber: Men over 50 yrs - 30 grams per day  Personal Goals:  Personal Goals and Risk Factors at Admission - 05/19/23 1532       Core Components/Risk Factors/Patient Goals on Admission    Weight Management Yes    Intervention Weight Management: Provide education and appropriate resources to help participant work on and attain dietary goals.;Weight Management: Develop a combined nutrition and exercise program designed to reach desired caloric intake, while maintaining appropriate intake of nutrient and fiber, sodium and fats, and appropriate energy expenditure required for the weight goal.    Admit Weight 148 lb 3.2 oz (67.2 kg)    Goal Weight: Short Term 148 lb (  67.1 kg)    Goal Weight: Long Term 148 lb (67.1 kg)    Expected Outcomes Short Term: Continue to assess and modify interventions until short term weight is achieved;Long Term: Adherence to nutrition and physical activity/exercise program aimed toward attainment of established weight goal;Weight Maintenance: Understanding of the daily nutrition guidelines, which includes 25-35% calories from fat, 7% or less cal from saturated fats, less than 200mg  cholesterol, less than 1.5gm of sodium, & 5 or more servings of fruits and vegetables daily;Understanding recommendations for meals to include 15-35% energy as protein, 25-35% energy from fat, 35-60% energy from carbohydrates, less than 200mg  of dietary cholesterol, 20-35 gm of total fiber daily;Understanding of distribution of calorie intake throughout the day with the consumption of 4-5 meals/snacks    Heart Failure Yes    Intervention Provide a combined exercise and nutrition  program that is supplemented with education, support and counseling about heart failure. Directed toward relieving symptoms such as shortness of breath, decreased exercise tolerance, and extremity edema.    Expected Outcomes Improve functional capacity of life;Short term: Attendance in program 2-3 days a week with increased exercise capacity. Reported lower sodium intake. Reported increased fruit and vegetable intake. Reports medication compliance.;Short term: Daily weights obtained and reported for increase. Utilizing diuretic protocols set by physician.;Long term: Adoption of self-care skills and reduction of barriers for early signs and symptoms recognition and intervention leading to self-care maintenance.    Hypertension Yes    Intervention Provide education on lifestyle modifcations including regular physical activity/exercise, weight management, moderate sodium restriction and increased consumption of fresh fruit, vegetables, and low fat dairy, alcohol  moderation, and smoking cessation.;Monitor prescription use compliance.    Expected Outcomes Short Term: Continued assessment and intervention until BP is < 140/48mm HG in hypertensive participants. < 130/39mm HG in hypertensive participants with diabetes, heart failure or chronic kidney disease.;Long Term: Maintenance of blood pressure at goal levels.    Lipids Yes    Intervention Provide education and support for participant on nutrition & aerobic/resistive exercise along with prescribed medications to achieve LDL 70mg , HDL >40mg .    Expected Outcomes Short Term: Participant states understanding of desired cholesterol values and is compliant with medications prescribed. Participant is following exercise prescription and nutrition guidelines.;Long Term: Cholesterol controlled with medications as prescribed, with individualized exercise RX and with personalized nutrition plan. Value goals: LDL < 70mg , HDL > 40 mg.             Tobacco Use Initial  Evaluation: Social History   Tobacco Use  Smoking Status Never  Smokeless Tobacco Never    Exercise Goals and Review:  Exercise Goals     Row Name 05/19/23 1530             Exercise Goals   Increase Physical Activity Yes       Intervention Provide advice, education, support and counseling about physical activity/exercise needs.;Develop an individualized exercise prescription for aerobic and resistive training based on initial evaluation findings, risk stratification, comorbidities and participant's personal goals.       Expected Outcomes Short Term: Attend rehab on a regular basis to increase amount of physical activity.;Long Term: Add in home exercise to make exercise part of routine and to increase amount of physical activity.;Long Term: Exercising regularly at least 3-5 days a week.       Increase Strength and Stamina Yes       Intervention Provide advice, education, support and counseling about physical activity/exercise needs.;Develop an individualized exercise prescription for aerobic  and resistive training based on initial evaluation findings, risk stratification, comorbidities and participant's personal goals.       Expected Outcomes Short Term: Increase workloads from initial exercise prescription for resistance, speed, and METs.;Short Term: Perform resistance training exercises routinely during rehab and add in resistance training at home;Long Term: Improve cardiorespiratory fitness, muscular endurance and strength as measured by increased METs and functional capacity ( )       Able to understand and use rate of perceived exertion (RPE) scale Yes       Intervention Provide education and explanation on how to use RPE scale       Expected Outcomes Short Term: Able to use RPE daily in rehab to express subjective intensity level;Long Term:  Able to use RPE to guide intensity level when exercising independently       Able to understand and use Dyspnea scale Yes       Intervention  Provide education and explanation on how to use Dyspnea scale       Expected Outcomes Short Term: Able to use Dyspnea scale daily in rehab to express subjective sense of shortness of breath during exertion;Long Term: Able to use Dyspnea scale to guide intensity level when exercising independently       Knowledge and understanding of Target Heart Rate Range (THRR) Yes       Intervention Provide education and explanation of THRR including how the numbers were predicted and where they are located for reference       Expected Outcomes Short Term: Able to state/look up THRR;Long Term: Able to use THRR to govern intensity when exercising independently;Short Term: Able to use daily as guideline for intensity in rehab       Able to check pulse independently Yes       Intervention Provide education and demonstration on how to check pulse in carotid and radial arteries.;Review the importance of being able to check your own pulse for safety during independent exercise       Expected Outcomes Short Term: Able to explain why pulse checking is important during independent exercise;Long Term: Able to check pulse independently and accurately       Understanding of Exercise Prescription Yes       Intervention Provide education, explanation, and written materials on patient's individual exercise prescription       Expected Outcomes Short Term: Able to explain program exercise prescription;Long Term: Able to explain home exercise prescription to exercise independently                Copy of goals given to participant.

## 2023-05-20 ENCOUNTER — Encounter (HOSPITAL_BASED_OUTPATIENT_CLINIC_OR_DEPARTMENT_OTHER): Payer: Self-pay | Admitting: Cardiovascular Disease

## 2023-05-20 NOTE — Procedures (Signed)
 Patient Name: Thomas [name], Thomas [name] Date: 05/12/2023 Gender: Male D.O.B: [birth year] Age (years): 60 Referring Provider: [name] Height (inches): 65 Interpreting Physician: Debby Sor MD, ABSM Weight (lbs): 155 RPSGT: Andria Blossom BMI: 26 MRN: [id number] Neck Size: 15.50  CLINICAL INFORMATION Sleep Study Type: NPSG  Indication for sleep study: Fatigue, Morning Headaches, Snoring, PAF  Epworth Sleepiness Score: 9  SLEEP STUDY TECHNIQUE As per the AASM Manual for the Scoring of Sleep and Associated Events v2.3 (April 2016) with a hypopnea requiring 4% desaturations.  The channels recorded and monitored were frontal, central and occipital EEG, electrooculogram (EOG), submentalis EMG (chin), nasal and oral airflow, thoracic and abdominal wall motion, anterior tibialis EMG, snore microphone, electrocardiogram, and pulse oximetry.  MEDICATIONS acetaminophen  (TYLENOL ) 500 MG tablet apixaban  (ELIQUIS ) 2.5 MG TABS tablet Artificial Tears ophthalmic solution atorvastatin  (LIPITOR ) 40 MG tablet buPROPion  (WELLBUTRIN  XL) 150 MG 24 hr tablet calcium  carbonate (TUMS - DOSED IN MG ELEMENTAL CALCIUM ) 500 MG chewable tablet Coenzyme Q10 (COQ10) 100 MG CAPS colchicine  0.6 MG tablet docusate sodium  (COLACE) 100 MG capsule dofetilide  (TIKOSYN ) 125 MCG capsule DULoxetine  (CYMBALTA ) 20 MG capsule febuxostat  (ULORIC ) 40 MG tablet gabapentin  (NEURONTIN ) 300 MG capsule hydrALAZINE  (APRESOLINE ) 25 MG tablet methocarbamol  (ROBAXIN ) 500 MG tablet metoprolol  succinate (TOPROL -XL) 50 MG 24 hr tablet metroNIDAZOLE  (FLAGYL ) 500 MG tablet NEEDLE, DISP, 18 G (BD DISP NEEDLES) 18G X 1-1/2 MISC NEEDLE, DISP, 21 G (BD DISP NEEDLES) 21G X 1-1/2 MISC omeprazole (PRILOSEC) 20 MG capsule sacubitril -valsartan  (ENTRESTO ) 24-26 MG Syringe, Disposable, (2-3CC SYRINGE) 3 ML MISC Testosterone  1.62 % GEL testosterone  cypionate (DEPOTESTOSTERONE CYPIONATE) 200 MG/ML injection Medications  self-administered by patient taken the night of the study : N/A  SLEEP ARCHITECTURE The study was initiated at 11:02:49 PM and ended at 5:04:35 AM.  Sleep onset time was 5.0 minutes and the sleep efficiency was 22.3%. The total sleep time was 80.5 minutes.  Stage REM latency was N/A minutes.  The patient spent 88.2% of the night in stage N1 sleep, 11.8% in stage N2 sleep, 0.0% in stage N3 and 0% in REM.  Alpha intrusion was absent.  Supine sleep was 8.12%.  RESPIRATORY PARAMETERS The overall apnea/hypopnea index (AHI) was 38.0 per hour. The respiratory disturbance index (RDI) was 73.0/h. There were 8 total apneas, including 8 obstructive, 0 central and 0 mixed apneas. There were 43 hypopneas and 47 RERAs.  The AHI during Stage REM sleep was N/A per hour. REM sleep was absent  AHI while supine was 45.9 per hour.  The mean oxygen saturation was 92.3%. The minimum SpO2 during sleep was 88.0%.  Moderate snoring was noted during this study.  CARDIAC DATA The 2 lead EKG demonstrated sinus rhythm. The mean heart rate was 60.5 beats per minute. Other EKG findings include: paced rhythm, AFib, burst of wide complex irregular tachycardia  LEG MOVEMENT DATA The total PLMS were 0 with a resulting PLMS index of 0.0. Associated arousal with leg movement index was 0.0 .  IMPRESSIONS - Severe obstructive sleep apnea occurred during this study (AHI 38.0/h; RDI 73.0/h; with absent REM sleep). - The patient had minimal oxygen desaturation during the study to a nadir of 88.0%. - The patient snored with moderate snoring volume. -  AFib was present, and a burst of wide complex irregular rhythm. - Reduced sleep efficinecy at 22.3%. Wake after sleep onset (WASO) was 276.3 minutes. - Clinically significant periodic limb movements did not occur during sleep. No significant associated arousals.  DIAGNOSIS - Obstructive Sleep Apnea (G47.33) -  Nocturnal Hypoxemia (G47.36) - PAF  RECOMMENDATIONS -  Therapeutic CPAP titration to determine optimal pressure required to alleviate sleep disordered breathing. In this patient with significant cardiovascular co-morbidities and severe OSA recommend an in-lab CPAP/BiPAP titration. If unable to schedule an in lab titration, initiate CPAP Auto with EPR of 3 at 6 - 18 cm of water. - Effort shjould be made to optimize nasal and oropharyngeal patency. - Positional therapy avoiding supine position during sleep. - Avoid alcohol , sedatives and other CNS depressants that may worsen sleep apnea and disrupt normal sleep architecture. - Sleep hygiene should be reviewed to assess factors that may improve sleep quality. - Weight management and regular exercise should be initiated or continued if appropriate.  [Electronically signed] 05/20/2023 01:11 PM  Debby Sor MD, LYNNELL, [facility] Diplomate, American Board of Sleep Medicine NPI: [id number]  [facility] PH: [phone]   FX: [phone] ACCREDITED BY THE AMERICAN ACADEMY OF SLEEP MEDICINE

## 2023-05-21 ENCOUNTER — Encounter: Payer: Medicare Other | Admitting: *Deleted

## 2023-05-21 ENCOUNTER — Encounter: Payer: Self-pay | Admitting: Internal Medicine

## 2023-05-21 ENCOUNTER — Encounter: Payer: Self-pay | Admitting: *Deleted

## 2023-05-21 DIAGNOSIS — I5022 Chronic systolic (congestive) heart failure: Secondary | ICD-10-CM

## 2023-05-21 NOTE — Progress Notes (Signed)
 Daily Session Note  Patient Details  Name: Thomas [name] MRN: [id number] Date of Birth: 08-Aug-1932 Referring Provider:   Flowsheet Row Cardiac Rehab from 05/19/2023 in [facility] Cardiac [facility] Pulmonary Rehab  Referring Provider Dr. Antionette Kirks       Encounter Date: 05/21/2023  Check In:  Session Check In - 05/21/23 1504       Check-In   Supervising physician immediately available to respond to emergencies See telemetry face sheet for immediately available ER MD    Location [facility]-Cardiac & Pulmonary Rehab    Staff Present Maxon Conetta BS, Exercise Physiologist;Laureen Bevin Bucks, BS, RRT, CPFT;Megan Felipe Horton RN,ADN;Joseph Hood RCP,RRT,BSRT    Virtual Visit No    Medication changes reported     No    Fall or balance concerns reported    No    Warm-up and Cool-down Performed on first and last piece of equipment    Resistance Training Performed Yes    VAD Patient? No    PAD/SET Patient? No      Pain Assessment   Currently in Pain? No/denies                Social History   Tobacco Use  Smoking Status Never  Smokeless Tobacco Never    Goals Met:  Independence with exercise equipment Exercise tolerated well No report of concerns or symptoms today Strength training completed today  Goals Unmet:  Not Applicable  Comments: First full day of exercise!  Patient was oriented to gym and equipment including functions, settings, policies, and procedures.  Patient's individual exercise prescription and treatment plan were reviewed.  All starting workloads were established based on the results of the 6 minute walk test done at initial orientation visit.  The plan for exercise progression was also introduced and progression will be customized based on patient's performance and goals.    Dr. Firman Hughes is Medical Director for [facility] Cardiac Rehabilitation.  Dr. Fuad Aleskerov is Medical Director for [facility] Pulmonary Rehabilitation.

## 2023-05-21 NOTE — Progress Notes (Signed)
 Cardiac Individual Treatment Plan  Patient Details  Name: VERNARD RYMER MRN: [id number] Date of Birth: 1932-06-01 Referring Provider:   Flowsheet Row Cardiac Rehab from 05/19/2023 in [facility] Cardiac and Pulmonary Rehab  Referring Provider Dr. Antionette Kirks       Initial Encounter Date:  Flowsheet Row Cardiac Rehab from 05/19/2023 in [facility] Cardiac and Pulmonary Rehab  Date 05/19/23       Visit Diagnosis: Heart failure, chronic systolic (HCC)  Patient's Home Medications on Admission:  Current Outpatient Medications:    acetaminophen  (TYLENOL ) 500 MG tablet, Take 500-1,000 mg by mouth every 6 (six) hours as needed for mild pain (pain score 1-3) or fever., Disp: , Rfl:    apixaban  (ELIQUIS ) 2.5 MG TABS tablet, Take 1 tablet (2.5 mg total) by mouth 2 (two) times daily., Disp: 90 tablet, Rfl: 3   Artificial Tears ophthalmic solution, Place 1 drop into both eyes in the morning, at noon, in the evening, and at bedtime., Disp: , Rfl:    atorvastatin  (LIPITOR) 40 MG tablet, Take 1 tablet (40 mg total) by mouth daily., Disp: 90 tablet, Rfl: 3   buPROPion  (WELLBUTRIN  XL) 150 MG 24 hr tablet, Take 150 mg by mouth daily., Disp: , Rfl:    calcium  carbonate (TUMS - DOSED IN MG ELEMENTAL CALCIUM ) 500 MG chewable tablet, Chew 1-2 tablets by mouth daily as needed for indigestion., Disp: , Rfl:    Coenzyme Q10 (COQ10) 100 MG CAPS, Take 100 mg by mouth daily., Disp: , Rfl:    colchicine  0.6 MG tablet, Take 0.6 mg by mouth daily as needed (for gout flares)., Disp: , Rfl:    docusate sodium  (COLACE) 100 MG capsule, Take 100 mg by mouth daily as needed for mild constipation., Disp: , Rfl:    dofetilide  (TIKOSYN ) 125 MCG capsule, Take 1 capsule (125 mcg total) by mouth 2 (two) times daily., Disp: 180 capsule, Rfl: 2   DULoxetine  (CYMBALTA ) 20 MG capsule, Take 20 mg by mouth 2 (two) times daily., Disp: , Rfl:    febuxostat  (ULORIC ) 40 MG tablet, Take 1 tablet (40 mg total) by mouth daily., Disp: 90 tablet, Rfl:  3   gabapentin  (NEURONTIN ) 300 MG capsule, Take 300 mg by mouth at bedtime., Disp: , Rfl:    hydrALAZINE  (APRESOLINE ) 25 MG tablet, Take 1 tablet (25 mg total) by mouth as needed (for a Systolic reading of 160 that will not decrease after 30 minutes rest.)., Disp: 30 tablet, Rfl: 0   methocarbamol  (ROBAXIN ) 500 MG tablet, Take 500 mg by mouth every 6 (six) hours as needed for muscle spasms., Disp: , Rfl:    metoprolol  succinate (TOPROL -XL) 50 MG 24 hr tablet, TAKE 1 TABLET BY MOUTH DAILY WITH OR IMMEDIATELY FOLLOWING A MEAL (Patient taking differently: Take 50 mg by mouth at bedtime.), Disp: 90 tablet, Rfl: 3   metroNIDAZOLE  (FLAGYL ) 500 MG tablet, TAKE 1 TABLET BY MOUTH TWICE DAILY ON SUNDAY AND MONDAY. (Patient taking differently: Take 1,000 mg by mouth See admin instructions. Take 1,000 mg by mouth on Saturday and Sunday- EVERY OTHER WEEKEND), Disp: 90 tablet, Rfl: 3   NEEDLE, DISP, 18 G (BD DISP NEEDLES) 18G X 1-1/2" MISC, 1 mg by Does not apply route every 14 (fourteen) days., Disp: 50 each, Rfl: 0   NEEDLE, DISP, 21 G (BD DISP NEEDLES) 21G X 1-1/2" MISC, 1 mg by Does not apply route every 14 (fourteen) days., Disp: 50 each, Rfl: 0   omeprazole (PRILOSEC) 20 MG capsule, Take 20 mg by  mouth daily as needed (for heartburn- 30 minutes before a meal)., Disp: , Rfl:    sacubitril -valsartan  (ENTRESTO ) 24-26 MG, Take 1 tablet by mouth 2 (two) times daily., Disp: 180 tablet, Rfl: 3   Syringe, Disposable, (2-3CC SYRINGE) 3 ML MISC, 1 mg by Does not apply route every 14 (fourteen) days., Disp: 25 each, Rfl: 3   Testosterone  1.62 % GEL, APPLY 2 PUMPS DAILY (Patient taking differently: Apply 2 Pump topically daily.), Disp: 75 g, Rfl: 5   testosterone  cypionate (DEPOTESTOSTERONE CYPIONATE) 200 MG/ML injection, Inject 1 mL (200 mg total) into the muscle every 28 (twenty-eight) days., Disp: 10 mL, Rfl: 0  Past Medical History: Past Medical History:  Diagnosis Date   Aortic valve disorders    Arthritis     Atrial flutter (HCC) 06/18/2016   "AF or AFl; not sure which" (06/23/2016)   Basal cell carcinoma    "face, nose left shoulder, left arm" (06/19/2016)   Basal cell carcinoma 09/13/2020   right temple   BBB (bundle branch block)    hx right   Chronic back pain    "neck, thoracic, lower back" (06/19/2016)   Complete heart block (HCC) 06/2016   Dyspnea    GERD (gastroesophageal reflux disease)    Gout    Heart block    "I've had type I, II Wenke before now" (06/19/2016)   History of gout    History of hiatal hernia    "self dx'd" (06/19/2016)   Hyperlipidemia    Hypertension    Lyme disease    "dx'd by me 2003; cx's showed dx 08/2015"   Migraine    "3-4/year" (06/19/2016)   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   PVC's (premature ventricular contractions)    Renal cancer, left (HCC) 2006   S/P cryotherapy   Spinal stenosis    "cervical, 1 thoracic, lumbar" (06/19/2016)   Squamous carcinoma    "face, nose left shoulder, left arm" (06/19/2016)   Stroke (HCC)    TIA (transient ischemic attack) 06/14/2016   "I'm not sure that's what it was" (06/25/2016)   Visit for monitoring Tikosyn  therapy 09/09/2017    Tobacco Use: Social History   Tobacco Use  Smoking Status Never  Smokeless Tobacco Never    Labs: Review Flowsheet  More data exists      Latest Ref Rng & Units 02/07/2020 11/01/2020 08/21/2021 01/09/2022 11/25/2022  Labs for ITP Cardiac and Pulmonary Rehab  Cholestrol 100 - 199 mg/dL 132  440  - 102  -  LDL (calc) 0 - 99 mg/dL 76  70  - 56  -  HDL-C >39 mg/dL 53  41  - 67  -  Trlycerides 0 - 149 mg/dL 47  69  - 39  -  Hemoglobin A1c 4.8 - 5.6 % - - 5.7  5.7  -  PH, Arterial 7.35 - 7.45 - - - - 7.376   PCO2 arterial 32 - 48 mmHg - - - - 41.7   Bicarbonate 20.0 - 28.0 mmol/L - - - - 25.5  24.4   TCO2 22 - 32 mmol/L - - - - 27  26   Acid-base deficit 0.0 - 2.0 mmol/L - - - - 1.0   O2 Saturation % - - - - 70  88     Details        Multiple values from one day are sorted in reverse-chronological order  Exercise Target Goals: Exercise Program Goal: Individual exercise prescription set using results from initial 6 min walk test and THRR while considering  patient's activity barriers and safety.   Exercise Prescription Goal: Initial exercise prescription builds to 30-45 minutes a day of aerobic activity, 2-3 days per week.  Home exercise guidelines will be given to patient during program as part of exercise prescription that the participant will acknowledge.   Education: Aerobic Exercise: - Group verbal and visual presentation on the components of exercise prescription. Introduces F.I.T.T principle from ACSM for exercise prescriptions.  Reviews F.I.T.T. principles of aerobic exercise including progression. Written material given at graduation.   Education: Resistance Exercise: - Group verbal and visual presentation on the components of exercise prescription. Introduces F.I.T.T principle from ACSM for exercise prescriptions  Reviews F.I.T.T. principles of resistance exercise including progression. Written material given at graduation.    Education: Exercise & Equipment Safety: - Individual verbal instruction and demonstration of equipment use and safety with use of the equipment. Flowsheet Row Cardiac Rehab from 05/19/2023 in [facility] Cardiac and Pulmonary Rehab  Date 05/19/23  Educator [facility]  Instruction Review Code 1- Verbalizes Understanding       Education: Exercise Physiology & General Exercise Guidelines: - Group verbal and written instruction with models to review the exercise physiology of the cardiovascular system and associated critical values. Provides general exercise guidelines with specific guidelines to those with heart or lung disease.    Education: Flexibility, Balance, Mind/Body Relaxation: - Group verbal and visual presentation with interactive activity on the components of exercise  prescription. Introduces F.I.T.T principle from ACSM for exercise prescriptions. Reviews F.I.T.T. principles of flexibility and balance exercise training including progression. Also discusses the mind body connection.  Reviews various relaxation techniques to help reduce and manage stress (i.e. Deep breathing, progressive muscle relaxation, and visualization). Balance handout provided to take home. Written material given at graduation.   Activity Barriers & Risk Stratification:  Activity Barriers & Cardiac Risk Stratification - 05/19/23 1526       Activity Barriers & Cardiac Risk Stratification   Activity Barriers Back Problems;Muscular Weakness    Cardiac Risk Stratification High             6 Minute Walk:  6 Minute Walk     Row Name 05/19/23 1525         6 Minute Walk   Phase Initial     Distance 1360 feet     Walk Time 6 minutes     # of Rest Breaks 0     MPH 2.58     METS 2.34     RPE 11     Perceived Dyspnea  0     VO2 Peak 8.21     Symptoms No     Resting HR 84 bpm     Resting BP 118/78     Resting Oxygen Saturation  98 %     Exercise Oxygen Saturation  during 6 min walk 92 %     Max Ex. HR 117 bpm     Max Ex. BP 132/70     2 Minute Post BP 120/80              Oxygen Initial Assessment:   Oxygen Re-Evaluation:   Oxygen Discharge (Final Oxygen Re-Evaluation):   Initial Exercise Prescription:  Initial Exercise Prescription - 05/19/23 1500       Date of Initial Exercise RX and Referring Provider   Date 05/19/23    Referring Provider Dr.  Antionette Kirks      Oxygen   Maintain Oxygen Saturation 88% or higher      Treadmill   MPH 1.8    Grade 1    Minutes 15    METs 2.63      Recumbant Bike   Level 3    RPM 50    Watts 20    Minutes 15    METs 2.34      NuStep   Level 3    SPM 80    Minutes 15    METs 2.34      REL-XR   Level 3    Speed 50    Minutes 15    METs 2.34      Biostep-RELP   Level 3    SPM 50    Minutes 15     METs 2.34      Prescription Details   Frequency (times per week) 3    Duration Progress to 30 minutes of continuous aerobic without signs/symptoms of physical distress      Intensity   THRR 40-80% of Max Heartrate 103-122    Ratings of Perceived Exertion 11-13    Perceived Dyspnea 0-4      Progression   Progression Continue to progress workloads to maintain intensity without signs/symptoms of physical distress.      Resistance Training   Training Prescription Yes    Weight 7    Reps 10-15             Perform Capillary Blood Glucose checks as needed.  Exercise Prescription Changes:   Exercise Prescription Changes     Row Name 05/19/23 1500             Response to Exercise   Blood Pressure (Admit) 118/78       Blood Pressure (Exercise) 132/70       Blood Pressure (Exit) 120/80       Heart Rate (Admit) 84 bpm       Heart Rate (Exercise) 117 bpm       Heart Rate (Exit) 66 bpm       Oxygen Saturation (Admit) 98 %       Oxygen Saturation (Exercise) 92 %       Oxygen Saturation (Exit) 99 %       Rating of Perceived Exertion (Exercise) 11       Perceived Dyspnea (Exercise) 0       Symptoms none       Comments 6 MWT results                Exercise Comments:   Exercise Goals and Review:   Exercise Goals     Row Name 05/19/23 1530             Exercise Goals   Increase Physical Activity Yes       Intervention Provide advice, education, support and counseling about physical activity/exercise needs.;Develop an individualized exercise prescription for aerobic and resistive training based on initial evaluation findings, risk stratification, comorbidities and participant's personal goals.       Expected Outcomes Short Term: Attend rehab on a regular basis to increase amount of physical activity.;Long Term: Add in home exercise to make exercise part of routine and to increase amount of physical activity.;Long Term: Exercising regularly at least 3-5 days a week.        Increase Strength and Stamina Yes       Intervention Provide advice, education, support and counseling about  physical activity/exercise needs.;Develop an individualized exercise prescription for aerobic and resistive training based on initial evaluation findings, risk stratification, comorbidities and participant's personal goals.       Expected Outcomes Short Term: Increase workloads from initial exercise prescription for resistance, speed, and METs.;Short Term: Perform resistance training exercises routinely during rehab and add in resistance training at home;Long Term: Improve cardiorespiratory fitness, muscular endurance and strength as measured by increased METs and functional capacity ( )       Able to understand and use rate of perceived exertion (RPE) scale Yes       Intervention Provide education and explanation on how to use RPE scale       Expected Outcomes Short Term: Able to use RPE daily in rehab to express subjective intensity level;Long Term:  Able to use RPE to guide intensity level when exercising independently       Able to understand and use Dyspnea scale Yes       Intervention Provide education and explanation on how to use Dyspnea scale       Expected Outcomes Short Term: Able to use Dyspnea scale daily in rehab to express subjective sense of shortness of breath during exertion;Long Term: Able to use Dyspnea scale to guide intensity level when exercising independently       Knowledge and understanding of Target Heart Rate Range (THRR) Yes       Intervention Provide education and explanation of THRR including how the numbers were predicted and where they are located for reference       Expected Outcomes Short Term: Able to state/look up THRR;Long Term: Able to use THRR to govern intensity when exercising independently;Short Term: Able to use daily as guideline for intensity in rehab       Able to check pulse independently Yes       Intervention Provide education and  demonstration on how to check pulse in carotid and radial arteries.;Review the importance of being able to check your own pulse for safety during independent exercise       Expected Outcomes Short Term: Able to explain why pulse checking is important during independent exercise;Long Term: Able to check pulse independently and accurately       Understanding of Exercise Prescription Yes       Intervention Provide education, explanation, and written materials on patient's individual exercise prescription       Expected Outcomes Short Term: Able to explain program exercise prescription;Long Term: Able to explain home exercise prescription to exercise independently                Exercise Goals Re-Evaluation :   Discharge Exercise Prescription (Final Exercise Prescription Changes):  Exercise Prescription Changes - 05/19/23 1500       Response to Exercise   Blood Pressure (Admit) 118/78    Blood Pressure (Exercise) 132/70    Blood Pressure (Exit) 120/80    Heart Rate (Admit) 84 bpm    Heart Rate (Exercise) 117 bpm    Heart Rate (Exit) 66 bpm    Oxygen Saturation (Admit) 98 %    Oxygen Saturation (Exercise) 92 %    Oxygen Saturation (Exit) 99 %    Rating of Perceived Exertion (Exercise) 11    Perceived Dyspnea (Exercise) 0    Symptoms none    Comments 6 MWT results             Nutrition:  Target Goals: Understanding of nutrition guidelines, daily intake of sodium 1500mg , cholesterol 200mg , calories 30%  from fat and 7% or less from saturated fats, daily to have 5 or more servings of fruits and vegetables.  Education: All About Nutrition: -Group instruction provided by verbal, written material, interactive activities, discussions, models, and posters to present general guidelines for heart healthy nutrition including fat, fiber, MyPlate, the role of sodium in heart healthy nutrition, utilization of the nutrition label, and utilization of this knowledge for meal planning. Follow  up email sent as well. Written material given at graduation.   Biometrics:  Pre Biometrics - 05/19/23 1531       Pre Biometrics   Height 5' 5.5" (1.664 m)    Weight 148 lb 3.2 oz (67.2 kg)    Waist Circumference 35 inches    Hip Circumference 38 inches    Waist to Hip Ratio 0.92 %    BMI (Calculated) 24.28    Single Leg Stand 2.47 seconds              Nutrition Therapy Plan and Nutrition Goals:  Nutrition Therapy & Goals - 05/19/23 1621       Nutrition Therapy   Diet Cardiac, low Na    Protein (specify units) 60-75g    Fiber 30 grams    Whole Grain Foods 3 servings    Saturated Fats 15 max. grams    Fruits and Vegetables 5 servings/day    Sodium 2 grams      Personal Nutrition Goals   Nutrition Goal Drink 64oz of water daily    Personal Goal #2 Eat 15-20gProtein and 30-60gCarbs at each meal.    Personal Goal #3 Read labels and reduce sodium intake to below 2300mg . Ideally 1500mg  per day.    Comments Patient recovering from hospital stay and reports appetite has improved a lot since his inpatient stay, says it could still use more improvement. Spoke today about ways to meet nutritional needs with less quantity of foods, since her reports some nausea and early satiety. Encouraged him to eat a quality protein ~15-20g per meal paired with a complex carb. Reviewed some good pairings and answered questions around protein, types of fats, sodium, and sugar. Reviewed a few facts labels and provided advice on how to read labels and determine if the foods are valuable for his nutritional goals. Encouraged efficient foods like beans, lentils, and hummus. Healthy fat rich foods like peanuts, peanut butter, fish, and olive oil to increase calories with less quantity of food. Wanted to set goal of ~64oz of water daily, reminded him to ensure water intake doesn't further reduce food intake, suggested he sip on water between meal and keep water at meal to what is needed to enjoy the meal and  ensure safe swallowing when appetite is poor. He is optimistic that his appetite will improve and he will be able to eat more normally. Will continue to monitor and support      Intervention Plan   Intervention Prescribe, educate and counsel regarding individualized specific dietary modifications aiming towards targeted core components such as weight, hypertension, lipid management, diabetes, heart failure and other comorbidities.;Nutrition handout(s) given to patient.    Expected Outcomes Short Term Goal: Understand basic principles of dietary content, such as calories, fat, sodium, cholesterol and nutrients.;Short Term Goal: A plan has been developed with personal nutrition goals set during dietitian appointment.;Long Term Goal: Adherence to prescribed nutrition plan.             Nutrition Assessments:  MEDIFICTS Score Key: >=70 Need to make dietary changes  40-70 Heart  Healthy Diet <= 40 Therapeutic Level Cholesterol Diet  Flowsheet Row Cardiac Rehab from 05/19/2023 in [facility] Cardiac and Pulmonary Rehab  Picture Your Plate Total Score on Admission 75      Picture Your Plate Scores: <62 Unhealthy dietary pattern with much room for improvement. 41-50 Dietary pattern unlikely to meet recommendations for good health and room for improvement. 51-60 More healthful dietary pattern, with some room for improvement.  >60 Healthy dietary pattern, although there may be some specific behaviors that could be improved.    Nutrition Goals Re-Evaluation:   Nutrition Goals Discharge (Final Nutrition Goals Re-Evaluation):   Psychosocial: Target Goals: Acknowledge presence or absence of significant depression and/or stress, maximize coping skills, provide positive support system. Participant is able to verbalize types and ability to use techniques and skills needed for reducing stress and depression.   Education: Stress, Anxiety, and Depression - Group verbal and visual presentation to define  topics covered.  Reviews how body is impacted by stress, anxiety, and depression.  Also discusses healthy ways to reduce stress and to treat/manage anxiety and depression.  Written material given at graduation.   Education: Sleep Hygiene -Provides group verbal and written instruction about how sleep can affect your health.  Define sleep hygiene, discuss sleep cycles and impact of sleep habits. Review good sleep hygiene tips.    Initial Review & Psychosocial Screening:  Initial Psych Review & Screening - 04/21/23 1417       Initial Review   Current issues with Current Psychotropic Meds      Family Dynamics   Concerns Recent loss of significant other    Comments loss of 4 significant family members in the last year      Barriers   Psychosocial barriers to participate in program There are no identifiable barriers or psychosocial needs.;The patient should benefit from training in stress management and relaxation.      Screening Interventions   Interventions Encouraged to exercise;To provide support and resources with identified psychosocial needs;Provide feedback about the scores to participant    Expected Outcomes Short Term goal: Utilizing psychosocial counselor, staff and physician to assist with identification of specific Stressors or current issues interfering with healing process. Setting desired goal for each stressor or current issue identified.;Long Term Goal: Stressors or current issues are controlled or eliminated.;Short Term goal: Identification and review with participant of any Quality of Life or Depression concerns found by scoring the questionnaire.;Long Term goal: The participant improves quality of Life and PHQ9 Scores as seen by post scores and/or verbalization of changes             Quality of Life Scores:   Quality of Life - 05/19/23 1533       Quality of Life   Select Quality of Life      Quality of Life Scores   Health/Function Pre 24.43 %    Socioeconomic  Pre 29.58 %    Psych/Spiritual Pre 24 %    Family Pre 30 %    GLOBAL Pre 26.12 %            Scores of 19 and below usually indicate a poorer quality of life in these areas.  A difference of  2-3 points is a clinically meaningful difference.  A difference of 2-3 points in the total score of the Quality of Life Index has been associated with significant improvement in overall quality of life, self-image, physical symptoms, and general health in studies assessing change in quality of life.  PHQ-9:  Review Flowsheet  More data exists      05/19/2023 01/09/2022 09/26/2021 08/21/2021 10/17/2020  Depression screen PHQ 2/9  Decreased Interest 1 0 0 0 0  Down, Depressed, Hopeless 0 0 0 0 0  PHQ - 2 Score 1 0 0 0 0  Altered sleeping 1 0 0 1 0  Tired, decreased energy 1 1 0 2 1  Change in appetite 1 0 0 0 1  Feeling bad or failure about yourself  0 0 0 0 0  Trouble concentrating 0 0 0 0 0  Moving slowly or fidgety/restless 0 0 0 0 0  Suicidal thoughts 0 0 0 0 0  PHQ-9 Score 4 1 0 3 2  Difficult doing work/chores Somewhat difficult Not difficult at all Not difficult at all Not difficult at all Not difficult at all   Interpretation of Total Score  Total Score Depression Severity:  1-4 = Minimal depression, 5-9 = Mild depression, 10-14 = Moderate depression, 15-19 = Moderately severe depression, 20-27 = Severe depression   Psychosocial Evaluation and Intervention:  Psychosocial Evaluation - 04/21/23 1442       Psychosocial Evaluation & Interventions   Interventions Encouraged to exercise with the program and follow exercise prescription    Comments Martina has no barriers to attending the program. He is ready to start and get back to his exercise routine. He lives with his wife and he has her as his support.   He has loss 4 siginificant members of his family in the past year.   He does take wellbutrin  and cymbalta ,[facility] for "brain fog", the meds are helping as he goes through  with all his loss this year.    Expected Outcomes STG attends all scheduled sessions, continues with his meds as prescribed to help  deal with his life changes.  LTG Able to continue to manage with his meds as prescribed    Continue Psychosocial Services  Follow up required by staff             Psychosocial Re-Evaluation:   Psychosocial Discharge (Final Psychosocial Re-Evaluation):   Vocational Rehabilitation: Provide vocational rehab assistance to qualifying candidates.   Vocational Rehab Evaluation & Intervention:   Education: Education Goals: Education classes will be provided on a variety of topics geared toward better understanding of heart health and risk factor modification. Participant will state understanding/return demonstration of topics presented as noted by education test scores.  Learning Barriers/Preferences:   General Cardiac Education Topics:  AED/CPR: - Group verbal and written instruction with the use of models to demonstrate the basic use of the AED with the basic ABC's of resuscitation.   Anatomy and Cardiac Procedures: - Group verbal and visual presentation and models provide information about basic cardiac anatomy and function. Reviews the testing methods done to diagnose heart disease and the outcomes of the test results. Describes the treatment choices: Medical Management, Angioplasty, or Coronary Bypass Surgery for treating various heart conditions including Myocardial Infarction, Angina, Valve Disease, and Cardiac Arrhythmias.  Written material given at graduation.   Medication Safety: - Group verbal and visual instruction to review commonly prescribed medications for heart and lung disease. Reviews the medication, class of the drug, and side effects. Includes the steps to properly store meds and maintain the prescription regimen.  Written material given at graduation.   Intimacy: - Group verbal instruction through game format to discuss how heart  and lung disease can affect sexual intimacy. Written material given at graduation.Aaron Aas  Know Your Numbers and Heart Failure: - Group verbal and visual instruction to discuss disease risk factors for cardiac and pulmonary disease and treatment options.  Reviews associated critical values for Overweight/Obesity, Hypertension, Cholesterol, and Diabetes.  Discusses basics of heart failure: signs/symptoms and treatments.  Introduces Heart Failure Zone chart for action plan for heart failure.  Written material given at graduation. Flowsheet Row Cardiac Rehab from 05/19/2023 in [facility] Cardiac and Pulmonary Rehab  Education need identified 05/19/23       Infection Prevention: - Provides verbal and written material to individual with discussion of infection control including proper hand washing and proper equipment cleaning during exercise session. Flowsheet Row Cardiac Rehab from 05/19/2023 in [facility] Cardiac and Pulmonary Rehab  Date 05/19/23  Educator [facility]  Instruction Review Code 1- Verbalizes Understanding       Falls Prevention: - Provides verbal and written material to individual with discussion of falls prevention and safety. Flowsheet Row Cardiac Rehab from 05/19/2023 in [facility] Cardiac and Pulmonary Rehab  Date 05/19/23  Educator [facility]  Instruction Review Code 1- Verbalizes Understanding       Other: -Provides group and verbal instruction on various topics (see comments)   Knowledge Questionnaire Score:  Knowledge Questionnaire Score - 05/19/23 1533       Knowledge Questionnaire Score   Pre Score 24/26             Core Components/Risk Factors/Patient Goals at Admission:  Personal Goals and Risk Factors at Admission - 05/19/23 1532       Core Components/Risk Factors/Patient Goals on Admission    Weight Management Yes    Intervention Weight Management: Provide education and appropriate resources to help participant work on and attain dietary goals.;Weight Management: Develop a  combined nutrition and exercise program designed to reach desired caloric intake, while maintaining appropriate intake of nutrient and fiber, sodium and fats, and appropriate energy expenditure required for the weight goal.    Admit Weight 148 lb 3.2 oz (67.2 kg)    Goal Weight: Short Term 148 lb (67.1 kg)    Goal Weight: Long Term 148 lb (67.1 kg)    Expected Outcomes Short Term: Continue to assess and modify interventions until short term weight is achieved;Long Term: Adherence to nutrition and physical activity/exercise program aimed toward attainment of established weight goal;Weight Maintenance: Understanding of the daily nutrition guidelines, which includes 25-35% calories from fat, 7% or less cal from saturated fats, less than 200mg  cholesterol, less than 1.5gm of sodium, & 5 or more servings of fruits and vegetables daily;Understanding recommendations for meals to include 15-35% energy as protein, 25-35% energy from fat, 35-60% energy from carbohydrates, less than 200mg  of dietary cholesterol, 20-35 gm of total fiber daily;Understanding of distribution of calorie intake throughout the day with the consumption of 4-5 meals/snacks    Heart Failure Yes    Intervention Provide a combined exercise and nutrition program that is supplemented with education, support and counseling about heart failure. Directed toward relieving symptoms such as shortness of breath, decreased exercise tolerance, and extremity edema.    Expected Outcomes Improve functional capacity of life;Short term: Attendance in program 2-3 days a week with increased exercise capacity. Reported lower sodium intake. Reported increased fruit and vegetable intake. Reports medication compliance.;Short term: Daily weights obtained and reported for increase. Utilizing diuretic protocols set by physician.;Long term: Adoption of self-care skills and reduction of barriers for early signs and symptoms recognition and intervention leading to self-care  maintenance.    Hypertension Yes  Intervention Provide education on lifestyle modifcations including regular physical activity/exercise, weight management, moderate sodium restriction and increased consumption of fresh fruit, vegetables, and low fat dairy, alcohol moderation, and smoking cessation.;Monitor prescription use compliance.    Expected Outcomes Short Term: Continued assessment and intervention until BP is < 140/66mm HG in hypertensive participants. < 130/87mm HG in hypertensive participants with diabetes, heart failure or chronic kidney disease.;Long Term: Maintenance of blood pressure at goal levels.    Lipids Yes    Intervention Provide education and support for participant on nutrition & aerobic/resistive exercise along with prescribed medications to achieve LDL 70mg , HDL >40mg .    Expected Outcomes Short Term: Participant states understanding of desired cholesterol values and is compliant with medications prescribed. Participant is following exercise prescription and nutrition guidelines.;Long Term: Cholesterol controlled with medications as prescribed, with individualized exercise RX and with personalized nutrition plan. Value goals: LDL < 70mg , HDL > 40 mg.             Education:Diabetes - Individual verbal and written instruction to review signs/symptoms of diabetes, desired ranges of glucose level fasting, after meals and with exercise. Acknowledge that pre and post exercise glucose checks will be done for 3 sessions at entry of program.   Core Components/Risk Factors/Patient Goals Review:    Core Components/Risk Factors/Patient Goals at Discharge (Final Review):    ITP Comments:  ITP Comments     Row Name 04/21/23 1442 05/19/23 1524 05/21/23 1310       ITP Comments Virtual orientation call completed today. he has an appointment on Date: 04/24/2023  for EP eval and gym Orientation.  Documentation of diagnosis can be found in [facility] Date: 04/11/2023 . Completed and  gym orientation. Initial ITP created and sent for review to Dr. Firman Hughes, Medical Director. 30 Day review completed. Medical Director ITP review done, changes made as directed, and signed approval by Medical Director.    new to program              Comments:

## 2023-05-22 ENCOUNTER — Encounter: Payer: Medicare Other | Admitting: *Deleted

## 2023-05-22 ENCOUNTER — Encounter: Payer: Medicare Other | Admitting: Physical Therapy

## 2023-05-22 DIAGNOSIS — I5022 Chronic systolic (congestive) heart failure: Secondary | ICD-10-CM | POA: Diagnosis not present

## 2023-05-22 NOTE — Progress Notes (Signed)
Daily Session Note  Patient Details  Name: Thomas [name] MRN: [id number] Date of Birth: November 02, 1932 Referring Provider:   Flowsheet Row Cardiac Rehab from 05/19/2023 in [facility] Cardiac and Pulmonary Rehab  Referring Provider Dr. Lorine Bears       Encounter Date: 05/22/2023  Check In:  Session Check In - 05/22/23 1403       Check-In   Supervising physician immediately available to respond to emergencies See telemetry face sheet for immediately available ER MD    Location [facility]-Cardiac & Pulmonary Rehab    Staff Present Cora Collum, RN, BSN, CCRP;Joseph Hood RCP,RRT,BSRT;Noah Tickle, [place], Exercise Physiologist;Maxon Conetta BS, Exercise Physiologist    Virtual Visit No    Medication changes reported     No    Fall or balance concerns reported    No    Warm-up and Cool-down Performed on first and last piece of equipment    Resistance Training Performed Yes    VAD Patient? No    PAD/SET Patient? No      Pain Assessment   Currently in Pain? No/denies                Social History   Tobacco Use  Smoking Status Never  Smokeless Tobacco Never    Goals Met:  Exercise tolerated well No report of concerns or symptoms today  Goals Unmet:  Not Applicable  Comments: Pt able to follow exercise prescription today without complaint.  Will continue to monitor for progression.    Dr. Bethann Punches is Medical Director for [facility] Cardiac Rehabilitation.  Dr. Vida Rigger is Medical Director for [facility] Pulmonary Rehabilitation.

## 2023-05-23 ENCOUNTER — Other Ambulatory Visit: Payer: Self-pay

## 2023-05-23 ENCOUNTER — Other Ambulatory Visit: Payer: Medicare Other

## 2023-05-23 DIAGNOSIS — R7881 Bacteremia: Secondary | ICD-10-CM

## 2023-05-23 DIAGNOSIS — Z950 Presence of cardiac pacemaker: Secondary | ICD-10-CM

## 2023-05-25 LAB — CUP PACEART INCLINIC DEVICE CHECK
Date Time Interrogation Session: 20250109185047
Implantable Lead Connection Status: 753985
Implantable Lead Connection Status: 753985
Implantable Lead Connection Status: 753985
Implantable Lead Implant Date: 20180214
Implantable Lead Implant Date: 20180214
Implantable Lead Implant Date: 20240906
Implantable Lead Location: 753859
Implantable Lead Location: 753860
Implantable Lead Location: 753860
Implantable Lead Model: 3830
Implantable Lead Model: 5076
Implantable Lead Model: 5076
Implantable Pulse Generator Implant Date: 20240906

## 2023-05-26 ENCOUNTER — Telehealth: Payer: Self-pay

## 2023-05-26 DIAGNOSIS — R0683 Snoring: Secondary | ICD-10-CM

## 2023-05-26 DIAGNOSIS — G4733 Obstructive sleep apnea (adult) (pediatric): Secondary | ICD-10-CM

## 2023-05-26 DIAGNOSIS — I5022 Chronic systolic (congestive) heart failure: Secondary | ICD-10-CM

## 2023-05-26 NOTE — Telephone Encounter (Signed)
-----   Message from Nicki Guadalajara sent at 05/20/2023  1:21 PM EST ----- Eliseo Gum, please notify pt of result; set up for in-lab CPAP/BiPAP titration, if unable then Auto-PAP

## 2023-05-26 NOTE — Telephone Encounter (Signed)
 Left VM with callback number for sleep study results and recommendations.

## 2023-05-29 LAB — CULTURE, BLOOD (SINGLE)
MICRO NUMBER:: 15971240
MICRO NUMBER:: 15971241
SPECIMEN QUALITY:: ADEQUATE
SPECIMEN QUALITY:: ADEQUATE

## 2023-05-29 LAB — CBC
HCT: 43.4 % (ref 38.5–50.0)
Hemoglobin: 14.1 g/dL (ref 13.2–17.1)
MCH: 28.4 pg (ref 27.0–33.0)
MCHC: 32.5 g/dL (ref 32.0–36.0)
MCV: 87.5 fL (ref 80.0–100.0)
MPV: 11.4 fL (ref 7.5–12.5)
Platelets: 221 10*3/uL (ref 140–400)
RBC: 4.96 10*6/uL (ref 4.20–5.80)
RDW: 13.5 % (ref 11.0–15.0)
WBC: 6.3 10*3/uL (ref 3.8–10.8)

## 2023-05-29 LAB — COMPLETE METABOLIC PANEL WITH GFR
AG Ratio: 1.3 (calc) (ref 1.0–2.5)
ALT: 13 U/L (ref 9–46)
AST: 17 U/L (ref 10–35)
Albumin: 3.8 g/dL (ref 3.6–5.1)
Alkaline phosphatase (APISO): 92 U/L (ref 35–144)
BUN/Creatinine Ratio: 19 (calc) (ref 6–22)
BUN: 34 mg/dL — ABNORMAL HIGH (ref 7–25)
CO2: 31 mmol/L (ref 20–32)
Calcium: 9.5 mg/dL (ref 8.6–10.3)
Chloride: 103 mmol/L (ref 98–110)
Creat: 1.83 mg/dL — ABNORMAL HIGH (ref 0.70–1.22)
Globulin: 3 g/dL (ref 1.9–3.7)
Glucose, Bld: 72 mg/dL (ref 65–99)
Potassium: 4.5 mmol/L (ref 3.5–5.3)
Sodium: 141 mmol/L (ref 135–146)
Total Bilirubin: 0.6 mg/dL (ref 0.2–1.2)
Total Protein: 6.8 g/dL (ref 6.1–8.1)
eGFR: 35 mL/min/{1.73_m2} — ABNORMAL LOW (ref >=60)

## 2023-06-11 ENCOUNTER — Encounter: Payer: Medicare Other | Attending: Cardiovascular Disease | Admitting: *Deleted

## 2023-06-11 DIAGNOSIS — I5022 Chronic systolic (congestive) heart failure: Secondary | ICD-10-CM | POA: Insufficient documentation

## 2023-06-11 NOTE — Progress Notes (Signed)
 Daily Session Note  Patient Details  Name: Thomas [name] MRN: [id number] Date of Birth: 20-Feb-1933 Referring Provider:   Flowsheet Row Cardiac Rehab from 05/19/2023 in [facility] Cardiac and Pulmonary Rehab  Referring Provider Dr. [name]       Encounter Date: 06/11/2023  Check In:  Session Check In - 06/11/23 1341       Check-In   Supervising physician immediately available to respond to emergencies See telemetry face sheet for immediately available ER MD    Location [facility]-Cardiac & Pulmonary Rehab    Staff Present Hoy Rodney RN,BSN;Noah Tickle, BS, Exercise Physiologist;Maxon Burnell HECKLE, Exercise Physiologist;Kelly Dyane HECKLE, ACSM CEP, Exercise Physiologist    Virtual Visit No    Medication changes reported     No    Fall or balance concerns reported    No    Warm-up and Cool-down Performed on first and last piece of equipment    Resistance Training Performed Yes    VAD Patient? No    PAD/SET Patient? No      Pain Assessment   Currently in Pain? No/denies                Social History   Tobacco Use  Smoking Status Never  Smokeless Tobacco Never    Goals Met:  Independence with exercise equipment Exercise tolerated well No report of concerns or symptoms today Strength training completed today  Goals Unmet:  Not Applicable  Comments: Pt able to follow exercise prescription today without complaint.  Will continue to monitor for progression.    Dr. Oneil Pinal is Medical Director for [facility] Cardiac Rehabilitation.  Dr. Fuad Aleskerov is Medical Director for [facility] Pulmonary Rehabilitation.

## 2023-06-12 DIAGNOSIS — I5022 Chronic systolic (congestive) heart failure: Secondary | ICD-10-CM

## 2023-06-12 NOTE — Progress Notes (Signed)
 Daily Session Note  Patient Details  Name: Thomas [name] MRN: [id number] Date of Birth: [birth year] Referring Provider:   Flowsheet Row Cardiac Rehab from 05/19/2023 in [facility] Cardiac and Pulmonary Rehab  Referring Provider Dr. [name]       Encounter Date: 06/12/2023  Check In:  Session Check In - 06/12/23 1344       Check-In   Supervising physician immediately available to respond to emergencies See telemetry face sheet for immediately available ER MD    Location [facility]-Cardiac & Pulmonary Rehab    Staff Present Thomas Rodney RN,BSN;Joseph [facility] BS, Exercise Physiologist;Noah Tickle, BS, Exercise Physiologist    Virtual Visit No    Medication changes reported     No    Fall or balance concerns reported    No    Warm-up and Cool-down Performed on first and last piece of equipment    Resistance Training Performed Yes    VAD Patient? No    PAD/SET Patient? No      Pain Assessment   Currently in Pain? No/denies                Social History   Tobacco Use  Smoking Status Never  Smokeless Tobacco Never    Goals Met:  Independence with exercise equipment Exercise tolerated well No report of concerns or symptoms today Strength training completed today  Goals Unmet:  Not Applicable  Comments: Pt able to follow exercise prescription today without complaint.  Will continue to monitor for progression.    Dr. Oneil Pinal is Medical Director for [facility] Cardiac Rehabilitation.  Dr. Fuad Aleskerov is Medical Director for [facility] Pulmonary Rehabilitation.

## 2023-06-16 ENCOUNTER — Encounter: Payer: Medicare Other | Admitting: *Deleted

## 2023-06-16 DIAGNOSIS — I5022 Chronic systolic (congestive) heart failure: Secondary | ICD-10-CM | POA: Diagnosis not present

## 2023-06-16 NOTE — Progress Notes (Signed)
 Daily Session Note  Patient Details  Name: Thomas [name] MRN: [id number] Date of Birth: 04/05/1933 Referring Provider:   Flowsheet Row Cardiac Rehab from 05/19/2023 in [facility] Cardiac and Pulmonary Rehab  Referring Provider Dr. Antionette Kirks       Encounter Date: 06/16/2023  Check In:  Session Check In - 06/16/23 1350       Check-In   Supervising physician immediately available to respond to emergencies See telemetry face sheet for immediately available ER MD    Location [facility]-Cardiac & Pulmonary Rehab    Staff Present Maxon Beauford Bounds, Exercise Physiologist;Roseana Rhine Manson Seitz RN,BSN;Joseph Hood RCP,RRT,BSRT;Noah Tickle, [place], Exercise Physiologist    Virtual Visit No    Medication changes reported     No    Fall or balance concerns reported    No    Warm-up and Cool-down Performed on first and last piece of equipment    Resistance Training Performed Yes    VAD Patient? No    PAD/SET Patient? No      Pain Assessment   Currently in Pain? No/denies                Social History   Tobacco Use  Smoking Status Never  Smokeless Tobacco Never    Goals Met:  Independence with exercise equipment Exercise tolerated well No report of concerns or symptoms today Strength training completed today  Goals Unmet:  Not Applicable  Comments: Pt able to follow exercise prescription today without complaint.  Will continue to monitor for progression.    Dr. Firman Hughes is Medical Director for [facility] Cardiac Rehabilitation.  Dr. Fuad Aleskerov is Medical Director for [facility] Pulmonary Rehabilitation.

## 2023-06-18 ENCOUNTER — Encounter: Payer: Self-pay | Admitting: *Deleted

## 2023-06-18 ENCOUNTER — Encounter: Payer: Medicare Other | Admitting: *Deleted

## 2023-06-18 DIAGNOSIS — I5022 Chronic systolic (congestive) heart failure: Secondary | ICD-10-CM | POA: Diagnosis not present

## 2023-06-18 NOTE — Progress Notes (Signed)
Daily Session Note  Patient Details  Name: Thomas [name] MRN: [id number] Date of Birth: 07/18/1932 Referring Provider:   Flowsheet Row Cardiac Rehab from 05/19/2023 in [facility] Cardiac and Pulmonary Rehab  Referring Provider Dr. Lorine Bears       Encounter Date: 06/18/2023  Check In:  Session Check In - 06/18/23 1349       Check-In   Supervising physician immediately available to respond to emergencies See telemetry face sheet for immediately available ER MD    Location [facility]-Cardiac & Pulmonary Rehab    Staff Present Susann Givens RN,BSN;Maxon Manya Silvas BS, Exercise Physiologist;Kelly Cloretta Ned, ACSM CEP, Exercise Physiologist;Noah Tickle, BS, Exercise Physiologist    Virtual Visit No    Medication changes reported     No    Fall or balance concerns reported    No    Warm-up and Cool-down Performed on first and last piece of equipment    Resistance Training Performed Yes    VAD Patient? No    PAD/SET Patient? No      Pain Assessment   Currently in Pain? No/denies                Social History   Tobacco Use  Smoking Status Never  Smokeless Tobacco Never    Goals Met:  Independence with exercise equipment Exercise tolerated well No report of concerns or symptoms today Strength training completed today  Goals Unmet:  Not Applicable  Comments: Pt able to follow exercise prescription today without complaint.  Will continue to monitor for progression.    Dr. Bethann Punches is Medical Director for [facility] Cardiac Rehabilitation.  Dr. Vida Rigger is Medical Director for [facility] Pulmonary Rehabilitation.

## 2023-06-18 NOTE — Progress Notes (Signed)
Cardiac Individual Treatment Plan  Patient Details  Name: LEBRON NAUERT MRN: [id number] Date of Birth: [birth year] Referring Provider:   Flowsheet Row Cardiac Rehab from 05/19/2023 in [facility] Cardiac and Pulmonary Rehab  Referring Provider Dr. Lorine Bears       Initial Encounter Date:  Flowsheet Row Cardiac Rehab from 05/19/2023 in [facility] Cardiac and Pulmonary Rehab  Date 05/19/23       Visit Diagnosis: Heart failure, chronic systolic (HCC)  Patient's Home Medications on Admission:  Current Outpatient Medications:    acetaminophen (TYLENOL) 500 MG tablet, Take 500-1,000 mg by mouth every 6 (six) hours as needed for mild pain (pain score 1-3) or fever., Disp: , Rfl:    apixaban (ELIQUIS) 2.5 MG TABS tablet, Take 1 tablet (2.5 mg total) by mouth 2 (two) times daily., Disp: 90 tablet, Rfl: 3   Artificial Tears ophthalmic solution, Place 1 drop into both eyes in the morning, at noon, in the evening, and at bedtime., Disp: , Rfl:    atorvastatin (LIPITOR) 40 MG tablet, Take 1 tablet (40 mg total) by mouth daily., Disp: 90 tablet, Rfl: 3   buPROPion (WELLBUTRIN XL) 150 MG 24 hr tablet, Take 150 mg by mouth daily., Disp: , Rfl:    calcium carbonate (TUMS - DOSED IN MG ELEMENTAL CALCIUM) 500 MG chewable tablet, Chew 1-2 tablets by mouth daily as needed for indigestion., Disp: , Rfl:    Coenzyme Q10 (COQ10) 100 MG CAPS, Take 100 mg by mouth daily., Disp: , Rfl:    colchicine 0.6 MG tablet, Take 0.6 mg by mouth daily as needed (for gout flares)., Disp: , Rfl:    docusate sodium (COLACE) 100 MG capsule, Take 100 mg by mouth daily as needed for mild constipation., Disp: , Rfl:    dofetilide (TIKOSYN) 125 MCG capsule, Take 1 capsule (125 mcg total) by mouth 2 (two) times daily., Disp: 180 capsule, Rfl: 2   DULoxetine (CYMBALTA) 20 MG capsule, Take 20 mg by mouth 2 (two) times daily., Disp: , Rfl:    febuxostat (ULORIC) 40 MG tablet, Take 1 tablet (40 mg total) by mouth daily., Disp: 90 tablet, Rfl:  3   gabapentin (NEURONTIN) 300 MG capsule, Take 300 mg by mouth at bedtime., Disp: , Rfl:    hydrALAZINE (APRESOLINE) 25 MG tablet, Take 1 tablet (25 mg total) by mouth as needed (for a Systolic reading of 160 that will not decrease after 30 minutes rest.)., Disp: 30 tablet, Rfl: 0   methocarbamol (ROBAXIN) 500 MG tablet, Take 500 mg by mouth every 6 (six) hours as needed for muscle spasms., Disp: , Rfl:    metoprolol succinate (TOPROL-XL) 50 MG 24 hr tablet, TAKE 1 TABLET BY MOUTH DAILY WITH OR IMMEDIATELY FOLLOWING A MEAL (Patient taking differently: Take 50 mg by mouth at bedtime.), Disp: 90 tablet, Rfl: 3   metroNIDAZOLE (FLAGYL) 500 MG tablet, TAKE 1 TABLET BY MOUTH TWICE DAILY ON SUNDAY AND MONDAY. (Patient taking differently: Take 1,000 mg by mouth See admin instructions. Take 1,000 mg by mouth on Saturday and Sunday- EVERY OTHER WEEKEND), Disp: 90 tablet, Rfl: 3   NEEDLE, DISP, 18 G (BD DISP NEEDLES) 18G X 1-1/2" MISC, 1 mg by Does not apply route every 14 (fourteen) days., Disp: 50 each, Rfl: 0   NEEDLE, DISP, 21 G (BD DISP NEEDLES) 21G X 1-1/2" MISC, 1 mg by Does not apply route every 14 (fourteen) days., Disp: 50 each, Rfl: 0   omeprazole (PRILOSEC) 20 MG capsule, Take 20 mg by  mouth daily as needed (for heartburn- 30 minutes before a meal)., Disp: , Rfl:    sacubitril-valsartan (ENTRESTO) 24-26 MG, Take 1 tablet by mouth 2 (two) times daily., Disp: 180 tablet, Rfl: 3   Syringe, Disposable, (2-3CC SYRINGE) 3 ML MISC, 1 mg by Does not apply route every 14 (fourteen) days., Disp: 25 each, Rfl: 3   Testosterone 1.62 % GEL, APPLY 2 PUMPS DAILY (Patient taking differently: Apply 2 Pump topically daily.), Disp: 75 g, Rfl: 5   testosterone cypionate (DEPOTESTOSTERONE CYPIONATE) 200 MG/ML injection, Inject 1 mL (200 mg total) into the muscle every 28 (twenty-eight) days., Disp: 10 mL, Rfl: 0  Past Medical History: Past Medical History:  Diagnosis Date   Aortic valve disorders    Arthritis     Atrial flutter (HCC) 06/18/2016   "AF or AFl; not sure which" (06/23/2016)   Basal cell carcinoma    "face, nose left shoulder, left arm" (06/19/2016)   Basal cell carcinoma 09/13/2020   right temple   BBB (bundle branch block)    hx right   Chronic back pain    "neck, thoracic, lower back" (06/19/2016)   Complete heart block (HCC) 06/2016   Dyspnea    GERD (gastroesophageal reflux disease)    Gout    Heart block    "I've had type I, II Wenke before now" (06/19/2016)   History of gout    History of hiatal hernia    "self dx'd" (06/19/2016)   Hyperlipidemia    Hypertension    Lyme disease    "dx'd by me 2003; cx's showed dx 08/2015"   Migraine    "3-4/year" (06/19/2016)   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   PVC's (premature ventricular contractions)    Renal cancer, left (HCC) 2006   S/P cryotherapy   Spinal stenosis    "cervical, 1 thoracic, lumbar" (06/19/2016)   Squamous carcinoma    "face, nose left shoulder, left arm" (06/19/2016)   Stroke (HCC)    TIA (transient ischemic attack) 06/14/2016   "I'm not sure that's what it was" (06/25/2016)   Visit for monitoring Tikosyn therapy 09/09/2017    Tobacco Use: Social History   Tobacco Use  Smoking Status Never  Smokeless Tobacco Never    Labs: Review Flowsheet  More data exists      Latest Ref Rng & Units 02/07/2020 11/01/2020 08/21/2021 01/09/2022 11/25/2022  Labs for ITP Cardiac and Pulmonary Rehab  Cholestrol 100 - 199 mg/dL 161  096  - 045  -  LDL (calc) 0 - 99 mg/dL 76  70  - 56  -  HDL-C >39 mg/dL 53  41  - 67  -  Trlycerides 0 - 149 mg/dL 47  69  - 39  -  Hemoglobin A1c 4.8 - 5.6 % - - 5.7  5.7  -  PH, Arterial 7.35 - 7.45 - - - - 7.376   PCO2 arterial 32 - 48 mmHg - - - - 41.7   Bicarbonate 20.0 - 28.0 mmol/L - - - - 25.5  24.4   TCO2 22 - 32 mmol/L - - - - 27  26   Acid-base deficit 0.0 - 2.0 mmol/L - - - - 1.0   O2 Saturation % - - - - 70  88     Details        Multiple values from one day are sorted in reverse-chronological order  Exercise Target Goals: Exercise Program Goal: Individual exercise prescription set using results from initial 6 min walk test and THRR while considering  patient's activity barriers and safety.   Exercise Prescription Goal: Initial exercise prescription builds to 30-45 minutes a day of aerobic activity, 2-3 days per week.  Home exercise guidelines will be given to patient during program as part of exercise prescription that the participant will acknowledge.   Education: Aerobic Exercise: - Group verbal and visual presentation on the components of exercise prescription. Introduces F.I.T.T principle from ACSM for exercise prescriptions.  Reviews F.I.T.T. principles of aerobic exercise including progression. Written material given at graduation.   Education: Resistance Exercise: - Group verbal and visual presentation on the components of exercise prescription. Introduces F.I.T.T principle from ACSM for exercise prescriptions  Reviews F.I.T.T. principles of resistance exercise including progression. Written material given at graduation.    Education: Exercise & Equipment Safety: - Individual verbal instruction and demonstration of equipment use and safety with use of the equipment. Flowsheet Row Cardiac Rehab from 06/11/2023 in [facility] Cardiac and Pulmonary Rehab  Date 05/19/23  Educator [facility]  Instruction Review Code 1- Verbalizes Understanding       Education: Exercise Physiology & General Exercise Guidelines: - Group verbal and written instruction with models to review the exercise physiology of the cardiovascular system and associated critical values. Provides general exercise guidelines with specific guidelines to those with heart or lung disease.    Education: Flexibility, Balance, Mind/Body Relaxation: - Group verbal and visual presentation with interactive activity on the components of exercise  prescription. Introduces F.I.T.T principle from ACSM for exercise prescriptions. Reviews F.I.T.T. principles of flexibility and balance exercise training including progression. Also discusses the mind body connection.  Reviews various relaxation techniques to help reduce and manage stress (i.e. Deep breathing, progressive muscle relaxation, and visualization). Balance handout provided to take home. Written material given at graduation.   Activity Barriers & Risk Stratification:  Activity Barriers & Cardiac Risk Stratification - 05/19/23 1526       Activity Barriers & Cardiac Risk Stratification   Activity Barriers Back Problems;Muscular Weakness    Cardiac Risk Stratification High             6 Minute Walk:  6 Minute Walk     Row Name 05/19/23 1525         6 Minute Walk   Phase Initial     Distance 1360 feet     Walk Time 6 minutes     # of Rest Breaks 0     MPH 2.58     METS 2.34     RPE 11     Perceived Dyspnea  0     VO2 Peak 8.21     Symptoms No     Resting HR 84 bpm     Resting BP 118/78     Resting Oxygen Saturation  98 %     Exercise Oxygen Saturation  during 6 min walk 92 %     Max Ex. HR 117 bpm     Max Ex. BP 132/70     2 Minute Post BP 120/80              Oxygen Initial Assessment:   Oxygen Re-Evaluation:   Oxygen Discharge (Final Oxygen Re-Evaluation):   Initial Exercise Prescription:  Initial Exercise Prescription - 05/19/23 1500       Date of Initial Exercise RX and Referring Provider   Date 05/19/23    Referring Provider Dr.  Lorine Bears      Oxygen   Maintain Oxygen Saturation 88% or higher      Treadmill   MPH 1.8    Grade 1    Minutes 15    METs 2.63      Recumbant Bike   Level 3    RPM 50    Watts 20    Minutes 15    METs 2.34      NuStep   Level 3    SPM 80    Minutes 15    METs 2.34      REL-XR   Level 3    Speed 50    Minutes 15    METs 2.34      Biostep-RELP   Level 3    SPM 50    Minutes 15     METs 2.34      Prescription Details   Frequency (times per week) 3    Duration Progress to 30 minutes of continuous aerobic without signs/symptoms of physical distress      Intensity   THRR 40-80% of Max Heartrate 103-122    Ratings of Perceived Exertion 11-13    Perceived Dyspnea 0-4      Progression   Progression Continue to progress workloads to maintain intensity without signs/symptoms of physical distress.      Resistance Training   Training Prescription Yes    Weight 7    Reps 10-15             Perform Capillary Blood Glucose checks as needed.  Exercise Prescription Changes:   Exercise Prescription Changes     Row Name 05/19/23 1500 06/02/23 1500 06/17/23 1500         Response to Exercise   Blood Pressure (Admit) 118/78 106/60 136/66     Blood Pressure (Exercise) 132/70 122/56 155/66     Blood Pressure (Exit) 120/80 126/60 126/64     Heart Rate (Admit) 84 bpm 89 bpm 82 bpm     Heart Rate (Exercise) 117 bpm 117 bpm 123 bpm     Heart Rate (Exit) 66 bpm 67 bpm 70 bpm     Oxygen Saturation (Admit) 98 % -- --     Oxygen Saturation (Exercise) 92 % -- --     Oxygen Saturation (Exit) 99 % -- --     Rating of Perceived Exertion (Exercise) 11 13 13      Perceived Dyspnea (Exercise) 0 -- 0     Symptoms none none none     Comments 6 MWT results first 2 exercise sessions --     Duration -- Progress to 30 minutes of  aerobic without signs/symptoms of physical distress Progress to 30 minutes of  aerobic without signs/symptoms of physical distress     Intensity -- THRR unchanged THRR unchanged       Progression   Progression -- Continue to progress workloads to maintain intensity without signs/symptoms of physical distress. Continue to progress workloads to maintain intensity without signs/symptoms of physical distress.     Average METs -- 2.38 2.8       Resistance Training   Training Prescription -- Yes Yes     Weight -- 7 7     Reps -- 10-15 10-15       Interval  Training   Interval Training -- -- No       Oxygen   Oxygen -- Continuous --       Treadmill   MPH -- 2.2 2.2  Grade -- 1 0     Minutes -- 15 15     METs -- 2.99 --       NuStep   Level -- 3 --     SPM -- 80 --     Minutes -- 15 --     METs -- 2 --       REL-XR   Level -- -- 3     Minutes -- -- 15     METs -- -- 3.2       T5 Nustep   Level -- 1 --     SPM -- 80 --     Minutes -- 15 --     METs -- 2 --       Oxygen   Maintain Oxygen Saturation -- 88% or higher 88% or higher              Exercise Comments:   Exercise Comments     Row Name 05/21/23 1505           Exercise Comments First full day of exercise!  Patient was oriented to gym and equipment including functions, settings, policies, and procedures.  Patient's individual exercise prescription and treatment plan were reviewed.  All starting workloads were established based on the results of the 6 minute walk test done at initial orientation visit.  The plan for exercise progression was also introduced and progression will be customized based on patient's performance and goals.                Exercise Goals and Review:   Exercise Goals     Row Name 05/19/23 1530             Exercise Goals   Increase Physical Activity Yes       Intervention Provide advice, education, support and counseling about physical activity/exercise needs.;Develop an individualized exercise prescription for aerobic and resistive training based on initial evaluation findings, risk stratification, comorbidities and participant's personal goals.       Expected Outcomes Short Term: Attend rehab on a regular basis to increase amount of physical activity.;Long Term: Add in home exercise to make exercise part of routine and to increase amount of physical activity.;Long Term: Exercising regularly at least 3-5 days a week.       Increase Strength and Stamina Yes       Intervention Provide advice, education, support and counseling  about physical activity/exercise needs.;Develop an individualized exercise prescription for aerobic and resistive training based on initial evaluation findings, risk stratification, comorbidities and participant's personal goals.       Expected Outcomes Short Term: Increase workloads from initial exercise prescription for resistance, speed, and METs.;Short Term: Perform resistance training exercises routinely during rehab and add in resistance training at home;Long Term: Improve cardiorespiratory fitness, muscular endurance and strength as measured by increased METs and functional capacity ( )       Able to understand and use rate of perceived exertion (RPE) scale Yes       Intervention Provide education and explanation on how to use RPE scale       Expected Outcomes Short Term: Able to use RPE daily in rehab to express subjective intensity level;Long Term:  Able to use RPE to guide intensity level when exercising independently       Able to understand and use Dyspnea scale Yes       Intervention Provide education and explanation on how to use Dyspnea scale  Expected Outcomes Short Term: Able to use Dyspnea scale daily in rehab to express subjective sense of shortness of breath during exertion;Long Term: Able to use Dyspnea scale to guide intensity level when exercising independently       Knowledge and understanding of Target Heart Rate Range (THRR) Yes       Intervention Provide education and explanation of THRR including how the numbers were predicted and where they are located for reference       Expected Outcomes Short Term: Able to state/look up THRR;Long Term: Able to use THRR to govern intensity when exercising independently;Short Term: Able to use daily as guideline for intensity in rehab       Able to check pulse independently Yes       Intervention Provide education and demonstration on how to check pulse in carotid and radial arteries.;Review the importance of being able to check your  own pulse for safety during independent exercise       Expected Outcomes Short Term: Able to explain why pulse checking is important during independent exercise;Long Term: Able to check pulse independently and accurately       Understanding of Exercise Prescription Yes       Intervention Provide education, explanation, and written materials on patient's individual exercise prescription       Expected Outcomes Short Term: Able to explain program exercise prescription;Long Term: Able to explain home exercise prescription to exercise independently                Exercise Goals Re-Evaluation :  Exercise Goals Re-Evaluation     Row Name 05/21/23 1508 06/02/23 1558 06/11/23 1406 06/17/23 1531       Exercise Goal Re-Evaluation   Exercise Goals Review Increase Physical Activity;Able to understand and use rate of perceived exertion (RPE) scale;Knowledge and understanding of Target Heart Rate Range (THRR);Understanding of Exercise Prescription;Increase Strength and Stamina;Able to check pulse independently Increase Physical Activity;Understanding of Exercise Prescription;Increase Strength and Stamina Increase Physical Activity;Increase Strength and Stamina;Understanding of Exercise Prescription Increase Physical Activity;Increase Strength and Stamina;Understanding of Exercise Prescription    Comments Reviewed RPE and dyspnea scale, THR and program prescription with pt today.  Pt voiced understanding and was given a copy of goals to take home. Lewi is off to a good start in the program. He has completed 2 exercise sessions. He did well with his exercise prescription and even increased his treadmill workload to a speed of 2. and incline of 1%. He also added T5 nustep to his exercise prescription. We will continue to monitor his progress in the program. Jamion is doing well at rehab, feels he is out of shape but is determined to get back to where he was before the surgeries. Currently on treadmill at 1.8  speed. He is happy with the ability to workout safely while being monitored. Encouraged him to continue to workout here at rehab and doing exercise safely when at home. Dilon is doing well in rehab. He was able to maintain his speed on the treadmill but decreased his incline from 1% to 0. He was also able to maintain a level of 3 on the XR. We will continue to encourage and monitor his progression in the program.    Expected Outcomes Short: Use RPE daily to regulate intensity.  Long: Follow program prescription in THR. Short: Continue to follow current exercise prescription. Long: Continue exercise to improve strength and stamina. STG: continue to come to rehab and increase workload as able. LTG: Continue to  improve strenght and stamina Short: Continue to follow exercise prescription and increase workloads when able. Long: Continue exercise to improve strength and stamina.             Discharge Exercise Prescription (Final Exercise Prescription Changes):  Exercise Prescription Changes - 06/17/23 1500       Response to Exercise   Blood Pressure (Admit) 136/66    Blood Pressure (Exercise) 155/66    Blood Pressure (Exit) 126/64    Heart Rate (Admit) 82 bpm    Heart Rate (Exercise) 123 bpm    Heart Rate (Exit) 70 bpm    Rating of Perceived Exertion (Exercise) 13    Perceived Dyspnea (Exercise) 0    Symptoms none    Duration Progress to 30 minutes of  aerobic without signs/symptoms of physical distress    Intensity THRR unchanged      Progression   Progression Continue to progress workloads to maintain intensity without signs/symptoms of physical distress.    Average METs 2.8      Resistance Training   Training Prescription Yes    Weight 7    Reps 10-15      Interval Training   Interval Training No      Treadmill   MPH 2.2    Grade 0    Minutes 15      REL-XR   Level 3    Minutes 15    METs 3.2      Oxygen   Maintain Oxygen Saturation 88% or higher              Nutrition:  Target Goals: Understanding of nutrition guidelines, daily intake of sodium 1500mg , cholesterol 200mg , calories 30% from fat and 7% or less from saturated fats, daily to have 5 or more servings of fruits and vegetables.  Education: All About Nutrition: -Group instruction provided by verbal, written material, interactive activities, discussions, models, and posters to present general guidelines for heart healthy nutrition including fat, fiber, MyPlate, the role of sodium in heart healthy nutrition, utilization of the nutrition label, and utilization of this knowledge for meal planning. Follow up email sent as well. Written material given at graduation.   Biometrics:  Pre Biometrics - 05/19/23 1531       Pre Biometrics   Height 5' 5.5" (1.664 m)    Weight 148 lb 3.2 oz (67.2 kg)    Waist Circumference 35 inches    Hip Circumference 38 inches    Waist to Hip Ratio 0.92 %    BMI (Calculated) 24.28    Single Leg Stand 2.47 seconds              Nutrition Therapy Plan and Nutrition Goals:  Nutrition Therapy & Goals - 05/19/23 1621       Nutrition Therapy   Diet Cardiac, low Na    Protein (specify units) 60-75g    Fiber 30 grams    Whole Grain Foods 3 servings    Saturated Fats 15 max. grams    Fruits and Vegetables 5 servings/day    Sodium 2 grams      Personal Nutrition Goals   Nutrition Goal Drink 64oz of water daily    Personal Goal #2 Eat 15-20gProtein and 30-60gCarbs at each meal.    Personal Goal #3 Read labels and reduce sodium intake to below 2300mg . Ideally 1500mg  per day.    Comments Patient recovering from hospital stay and reports appetite has improved a lot since his inpatient stay, says it could still  use more improvement. Spoke today about ways to meet nutritional needs with less quantity of foods, since her reports some nausea and early satiety. Encouraged him to eat a quality protein ~15-20g per meal paired with a complex carb. Reviewed some  good pairings and answered questions around protein, types of fats, sodium, and sugar. Reviewed a few facts labels and provided advice on how to read labels and determine if the foods are valuable for his nutritional goals. Encouraged efficient foods like beans, lentils, and hummus. Healthy fat rich foods like peanuts, peanut butter, fish, and olive oil to increase calories with less quantity of food. Wanted to set goal of ~64oz of water daily, reminded him to ensure water intake doesn't further reduce food intake, suggested he sip on water between meal and keep water at meal to what is needed to enjoy the meal and ensure safe swallowing when appetite is poor. He is optimistic that his appetite will improve and he will be able to eat more normally. Will continue to monitor and support      Intervention Plan   Intervention Prescribe, educate and counsel regarding individualized specific dietary modifications aiming towards targeted core components such as weight, hypertension, lipid management, diabetes, heart failure and other comorbidities.;Nutrition handout(s) given to patient.    Expected Outcomes Short Term Goal: Understand basic principles of dietary content, such as calories, fat, sodium, cholesterol and nutrients.;Short Term Goal: A plan has been developed with personal nutrition goals set during dietitian appointment.;Long Term Goal: Adherence to prescribed nutrition plan.             Nutrition Assessments:  MEDIFICTS Score Key: >=70 Need to make dietary changes  40-70 Heart Healthy Diet <= 40 Therapeutic Level Cholesterol Diet  Flowsheet Row Cardiac Rehab from 05/19/2023 in [facility] Cardiac and Pulmonary Rehab  Picture Your Plate Total Score on Admission 75      Picture Your Plate Scores: <04 Unhealthy dietary pattern with much room for improvement. 41-50 Dietary pattern unlikely to meet recommendations for good health and room for improvement. 51-60 More healthful dietary pattern,  with some room for improvement.  >60 Healthy dietary pattern, although there may be some specific behaviors that could be improved.    Nutrition Goals Re-Evaluation:  Nutrition Goals Re-Evaluation     Row Name 06/11/23 1417             Goals   Comment Juwann reports his appetite is improving and he is tolerating food better. Remembers discussed with RD about meeting protein goals and limiting sodium and saturated fat. Reviewed some good snack ideas with healthy fats, complex carbs and protein rich foods.       Expected Outcome STG: continue to eat heart healthy and focus on recovering appetite. LTG: maintain heart healthy lifestyle                Nutrition Goals Discharge (Final Nutrition Goals Re-Evaluation):  Nutrition Goals Re-Evaluation - 06/11/23 1417       Goals   Comment Bayley reports his appetite is improving and he is tolerating food better. Remembers discussed with RD about meeting protein goals and limiting sodium and saturated fat. Reviewed some good snack ideas with healthy fats, complex carbs and protein rich foods.    Expected Outcome STG: continue to eat heart healthy and focus on recovering appetite. LTG: maintain heart healthy lifestyle             Psychosocial: Target Goals: Acknowledge presence or absence of significant depression and/or stress,  maximize coping skills, provide positive support system. Participant is able to verbalize types and ability to use techniques and skills needed for reducing stress and depression.   Education: Stress, Anxiety, and Depression - Group verbal and visual presentation to define topics covered.  Reviews how body is impacted by stress, anxiety, and depression.  Also discusses healthy ways to reduce stress and to treat/manage anxiety and depression.  Written material given at graduation.   Education: Sleep Hygiene -Provides group verbal and written instruction about how sleep can affect your health.  Define sleep hygiene,  discuss sleep cycles and impact of sleep habits. Review good sleep hygiene tips.    Initial Review & Psychosocial Screening:  Initial Psych Review & Screening - 04/21/23 1417       Initial Review   Current issues with Current Psychotropic Meds      Family Dynamics   Concerns Recent loss of significant other    Comments loss of 4 significant family members in the last year      Barriers   Psychosocial barriers to participate in program There are no identifiable barriers or psychosocial needs.;The patient should benefit from training in stress management and relaxation.      Screening Interventions   Interventions Encouraged to exercise;To provide support and resources with identified psychosocial needs;Provide feedback about the scores to participant    Expected Outcomes Short Term goal: Utilizing psychosocial counselor, staff and physician to assist with identification of specific Stressors or current issues interfering with healing process. Setting desired goal for each stressor or current issue identified.;Long Term Goal: Stressors or current issues are controlled or eliminated.;Short Term goal: Identification and review with participant of any Quality of Life or Depression concerns found by scoring the questionnaire.;Long Term goal: The participant improves quality of Life and PHQ9 Scores as seen by post scores and/or verbalization of changes             Quality of Life Scores:   Quality of Life - 05/19/23 1533       Quality of Life   Select Quality of Life      Quality of Life Scores   Health/Function Pre 24.43 %    Socioeconomic Pre 29.58 %    Psych/Spiritual Pre 24 %    Family Pre 30 %    GLOBAL Pre 26.12 %            Scores of 19 and below usually indicate a poorer quality of life in these areas.  A difference of  2-3 points is a clinically meaningful difference.  A difference of 2-3 points in the total score of the Quality of Life Index has been associated with  significant improvement in overall quality of life, self-image, physical symptoms, and general health in studies assessing change in quality of life.  PHQ-9: Review Flowsheet  More data exists      05/19/2023 01/09/2022 09/26/2021 08/21/2021 10/17/2020  Depression screen PHQ 2/9  Decreased Interest 1 0 0 0 0  Down, Depressed, Hopeless 0 0 0 0 0  PHQ - 2 Score 1 0 0 0 0  Altered sleeping 1 0 0 1 0  Tired, decreased energy 1 1 0 2 1  Change in appetite 1 0 0 0 1  Feeling bad or failure about yourself  0 0 0 0 0  Trouble concentrating 0 0 0 0 0  Moving slowly or fidgety/restless 0 0 0 0 0  Suicidal thoughts 0 0 0 0 0  PHQ-9 Score 4  1 0 3 2  Difficult doing work/chores Somewhat difficult Not difficult at all Not difficult at all Not difficult at all Not difficult at all   Interpretation of Total Score  Total Score Depression Severity:  1-4 = Minimal depression, 5-9 = Mild depression, 10-14 = Moderate depression, 15-19 = Moderately severe depression, 20-27 = Severe depression   Psychosocial Evaluation and Intervention:  Psychosocial Evaluation - 04/21/23 1442       Psychosocial Evaluation & Interventions   Interventions Encouraged to exercise with the program and follow exercise prescription    Comments Kyndall has no barriers to attending the program. He is ready to start and get back to his exercise routine. He lives with his wife and he has her as his support.   He has loss 4 siginificant members of his family in the past year.   He does take wellbutrin and cymbalta,[facility] for "[facility] fog", the meds are helping as he goes through with all his loss this year.    Expected Outcomes STG attends all scheduled sessions, continues with his meds as prescribed to help  deal with his life changes.  LTG Able to continue to manage with his meds as prescribed    Continue Psychosocial Services  Follow up required by staff             Psychosocial Re-Evaluation:  Psychosocial  Re-Evaluation     Row Name 06/11/23 1415             Psychosocial Re-Evaluation   Current issues with None Identified       Comments Talvin reports a good mindset and is not exeperiencing any stress, anxiety or depression. He says he has a good support system with family. Says he is sleeping well, wakes up well rested.       Expected Outcomes STG: continue to use support system and attend cardiac rehab and education classes. LTG: maintain positive outlook on health and lifestyle changes       Interventions Encouraged to attend Cardiac Rehabilitation for the exercise       Continue Psychosocial Services  Follow up required by staff                Psychosocial Discharge (Final Psychosocial Re-Evaluation):  Psychosocial Re-Evaluation - 06/11/23 1415       Psychosocial Re-Evaluation   Current issues with None Identified    Comments Tyde reports a good mindset and is not exeperiencing any stress, anxiety or depression. He says he has a good support system with family. Says he is sleeping well, wakes up well rested.    Expected Outcomes STG: continue to use support system and attend cardiac rehab and education classes. LTG: maintain positive outlook on health and lifestyle changes    Interventions Encouraged to attend Cardiac Rehabilitation for the exercise    Continue Psychosocial Services  Follow up required by staff             Vocational Rehabilitation: Provide vocational rehab assistance to qualifying candidates.   Vocational Rehab Evaluation & Intervention:   Education: Education Goals: Education classes will be provided on a variety of topics geared toward better understanding of heart health and risk factor modification. Participant will state understanding/return demonstration of topics presented as noted by education test scores.  Learning Barriers/Preferences:   General Cardiac Education Topics:  AED/CPR: - Group verbal and written instruction with the use of  models to demonstrate the basic use of the AED with the basic  ABC's of resuscitation.   Anatomy and Cardiac Procedures: - Group verbal and visual presentation and models provide information about basic cardiac anatomy and function. Reviews the testing methods done to diagnose heart disease and the outcomes of the test results. Describes the treatment choices: Medical Management, Angioplasty, or Coronary Bypass Surgery for treating various heart conditions including Myocardial Infarction, Angina, Valve Disease, and Cardiac Arrhythmias.  Written material given at graduation.   Medication Safety: - Group verbal and visual instruction to review commonly prescribed medications for heart and lung disease. Reviews the medication, class of the drug, and side effects. Includes the steps to properly store meds and maintain the prescription regimen.  Written material given at graduation. Flowsheet Row Cardiac Rehab from 06/11/2023 in [facility] Cardiac and Pulmonary Rehab  Date 06/11/23  Educator SB  Instruction Review Code 1- Verbalizes Understanding       Intimacy: - Group verbal instruction through game format to discuss how heart and lung disease can affect sexual intimacy. Written material given at graduation..   Know Your Numbers and Heart Failure: - Group verbal and visual instruction to discuss disease risk factors for cardiac and pulmonary disease and treatment options.  Reviews associated critical values for Overweight/Obesity, Hypertension, Cholesterol, and Diabetes.  Discusses basics of heart failure: signs/symptoms and treatments.  Introduces Heart Failure Zone chart for action plan for heart failure.  Written material given at graduation. Flowsheet Row Cardiac Rehab from 06/11/2023 in [facility] Cardiac and Pulmonary Rehab  Education need identified 05/19/23       Infection Prevention: - Provides verbal and written material to individual with discussion of infection control including proper hand  washing and proper equipment cleaning during exercise session. Flowsheet Row Cardiac Rehab from 06/11/2023 in [facility] Cardiac and Pulmonary Rehab  Date 05/19/23  Educator [facility]  Instruction Review Code 1- Verbalizes Understanding       Falls Prevention: - Provides verbal and written material to individual with discussion of falls prevention and safety. Flowsheet Row Cardiac Rehab from 06/11/2023 in [facility] Cardiac and Pulmonary Rehab  Date 05/19/23  Educator [facility]  Instruction Review Code 1- Verbalizes Understanding       Other: -Provides group and verbal instruction on various topics (see comments)   Knowledge Questionnaire Score:  Knowledge Questionnaire Score - 05/19/23 1533       Knowledge Questionnaire Score   Pre Score 24/26             Core Components/Risk Factors/Patient Goals at Admission:  Personal Goals and Risk Factors at Admission - 05/19/23 1532       Core Components/Risk Factors/Patient Goals on Admission    Weight Management Yes    Intervention Weight Management: Provide education and appropriate resources to help participant work on and attain dietary goals.;Weight Management: Develop a combined nutrition and exercise program designed to reach desired caloric intake, while maintaining appropriate intake of nutrient and fiber, sodium and fats, and appropriate energy expenditure required for the weight goal.    Admit Weight 148 lb 3.2 oz (67.2 kg)    Goal Weight: Short Term 148 lb (67.1 kg)    Goal Weight: Long Term 148 lb (67.1 kg)    Expected Outcomes Short Term: Continue to assess and modify interventions until short term weight is achieved;Long Term: Adherence to nutrition and physical activity/exercise program aimed toward attainment of established weight goal;Weight Maintenance: Understanding of the daily nutrition guidelines, which includes 25-35% calories from fat, 7% or less cal from saturated fats, less than 200mg  cholesterol, less than  1.5gm of sodium, & 5 or  more servings of fruits and vegetables daily;Understanding recommendations for meals to include 15-35% energy as protein, 25-35% energy from fat, 35-60% energy from carbohydrates, less than 200mg  of dietary cholesterol, 20-35 gm of total fiber daily;Understanding of distribution of calorie intake throughout the day with the consumption of 4-5 meals/snacks    Heart Failure Yes    Intervention Provide a combined exercise and nutrition program that is supplemented with education, support and counseling about heart failure. Directed toward relieving symptoms such as shortness of breath, decreased exercise tolerance, and extremity edema.    Expected Outcomes Improve functional capacity of life;Short term: Attendance in program 2-3 days a week with increased exercise capacity. Reported lower sodium intake. Reported increased fruit and vegetable intake. Reports medication compliance.;Short term: Daily weights obtained and reported for increase. Utilizing diuretic protocols set by physician.;Long term: Adoption of self-care skills and reduction of barriers for early signs and symptoms recognition and intervention leading to self-care maintenance.    Hypertension Yes    Intervention Provide education on lifestyle modifcations including regular physical activity/exercise, weight management, moderate sodium restriction and increased consumption of fresh fruit, vegetables, and low fat dairy, alcohol moderation, and smoking cessation.;Monitor prescription use compliance.    Expected Outcomes Short Term: Continued assessment and intervention until BP is < 140/20mm HG in hypertensive participants. < 130/32mm HG in hypertensive participants with diabetes, heart failure or chronic kidney disease.;Long Term: Maintenance of blood pressure at goal levels.    Lipids Yes    Intervention Provide education and support for participant on nutrition & aerobic/resistive exercise along with prescribed medications to achieve LDL 70mg ,  HDL >40mg .    Expected Outcomes Short Term: Participant states understanding of desired cholesterol values and is compliant with medications prescribed. Participant is following exercise prescription and nutrition guidelines.;Long Term: Cholesterol controlled with medications as prescribed, with individualized exercise RX and with personalized nutrition plan. Value goals: LDL < 70mg , HDL > 40 mg.             Education:Diabetes - Individual verbal and written instruction to review signs/symptoms of diabetes, desired ranges of glucose level fasting, after meals and with exercise. Acknowledge that pre and post exercise glucose checks will be done for 3 sessions at entry of program.   Core Components/Risk Factors/Patient Goals Review:   Goals and Risk Factor Review     Row Name 06/11/23 1420             Core Components/Risk Factors/Patient Goals Review   Personal Goals Review Weight Management/Obesity       Review He lost a lot of weight after surgery, appetite and taste were gone. This has improved and he reports he has gained back ~5lbs since starting rehab. Commended him on his recovery and reminded him of caloire and nutrient dense foods to add to meals and snacks.       Expected Outcomes STG: increase strength, weight stabilization. LTG: maintain and achieve strong healthy weight                Core Components/Risk Factors/Patient Goals at Discharge (Final Review):   Goals and Risk Factor Review - 06/11/23 1420       Core Components/Risk Factors/Patient Goals Review   Personal Goals Review Weight Management/Obesity    Review He lost a lot of weight after surgery, appetite and taste were gone. This has improved and he reports he has gained back ~5lbs since starting rehab. Commended him on his recovery and reminded  him of caloire and nutrient dense foods to add to meals and snacks.    Expected Outcomes STG: increase strength, weight stabilization. LTG: maintain and achieve  strong healthy weight             ITP Comments:  ITP Comments     Row Name 04/21/23 1442 05/19/23 1524 05/21/23 1310 05/21/23 1505 06/18/23 1127   ITP Comments Virtual orientation call completed today. he has an appointment on Date: 04/24/2023  for EP eval and gym Orientation.  Documentation of diagnosis can be found in [facility] Date: 04/11/2023 . Completed and gym orientation. Initial ITP created and sent for review to Dr. Bethann Punches, Medical Director. 30 Day review completed. Medical Director ITP review done, changes made as directed, and signed approval by Medical Director.    new to program First full day of exercise!  Patient was oriented to gym and equipment including functions, settings, policies, and procedures.  Patient's individual exercise prescription and treatment plan were reviewed.  All starting workloads were established based on the results of the 6 minute walk test done at initial orientation visit.  The plan for exercise progression was also introduced and progression will be customized based on patient's performance and goals. 30 Day review completed. Medical Director ITP review done, changes made as directed, and signed approval by Medical Director.    new to program            Comments:

## 2023-06-19 ENCOUNTER — Encounter: Payer: Medicare Other | Admitting: *Deleted

## 2023-06-19 ENCOUNTER — Encounter: Payer: Medicare Other | Admitting: Physical Therapy

## 2023-06-19 DIAGNOSIS — I5022 Chronic systolic (congestive) heart failure: Secondary | ICD-10-CM | POA: Diagnosis not present

## 2023-06-19 NOTE — Progress Notes (Signed)
Daily Session Note  Patient Details  Name: TYLIK TREESE MRN: [id number] Date of Birth: 1933-04-21 Referring Provider:   Flowsheet Row Cardiac Rehab from 05/19/2023 in [facility] Cardiac and Pulmonary Rehab  Referring Provider Dr. Lorine Bears       Encounter Date: 06/19/2023  Check In:  Session Check In - 06/19/23 1341       Check-In   Supervising physician immediately available to respond to emergencies See telemetry face sheet for immediately available ER MD    Location [facility]-Cardiac & Pulmonary Rehab    Staff Present Susann Givens RN,BSN;[facility] BS, Exercise Physiologist;Noah Tickle, BS, Exercise Physiologist    Virtual Visit No    Medication changes reported     No    Fall or balance concerns reported    No    Warm-up and Cool-down Performed on first and last piece of equipment    Resistance Training Performed Yes    VAD Patient? No    PAD/SET Patient? No      Pain Assessment   Currently in Pain? No/denies                Social History   Tobacco Use  Smoking Status Never  Smokeless Tobacco Never    Goals Met:  Independence with exercise equipment Exercise tolerated well No report of concerns or symptoms today Strength training completed today  Goals Unmet:  Not Applicable  Comments: Pt able to follow exercise prescription today without complaint.  Will continue to monitor for progression.    Dr. Bethann Punches is Medical Director for [facility] Cardiac Rehabilitation.  Dr. Vida Rigger is Medical Director for [facility] Pulmonary Rehabilitation.

## 2023-06-23 ENCOUNTER — Encounter: Payer: Medicare Other | Admitting: *Deleted

## 2023-06-23 DIAGNOSIS — I5022 Chronic systolic (congestive) heart failure: Secondary | ICD-10-CM | POA: Diagnosis not present

## 2023-06-23 NOTE — Progress Notes (Signed)
Daily Session Note  Patient Details  Name: Thomas [name] MRN: [id number] Date of Birth: 06-12-1932 Referring Provider:   Flowsheet Row Cardiac Rehab from 05/19/2023 in [facility] Cardiac and Pulmonary Rehab  Referring Provider Dr. Lorine Bears       Encounter Date: 06/23/2023  Check In:  Session Check In - 06/23/23 1403       Check-In   Supervising physician immediately available to respond to emergencies See telemetry face sheet for immediately available ER MD    Location [facility]-Cardiac & Pulmonary Rehab    Staff Present Maxon Conetta BS, Exercise Physiologist;Noah Tickle, BS, Exercise Physiologist;Tiwan Schnitker Jewel Baize RN,BSN;Joseph Hood RCP,RRT,BSRT    Virtual Visit No    Medication changes reported     No    Fall or balance concerns reported    No    Warm-up and Cool-down Performed on first and last piece of equipment    Resistance Training Performed Yes    VAD Patient? No    PAD/SET Patient? No      Pain Assessment   Currently in Pain? No/denies                Social History   Tobacco Use  Smoking Status Never  Smokeless Tobacco Never    Goals Met:  Independence with exercise equipment Exercise tolerated well No report of concerns or symptoms today Strength training completed today  Goals Unmet:  Not Applicable  Comments: Pt able to follow exercise prescription today without complaint.  Will continue to monitor for progression.    Dr. Bethann Punches is Medical Director for [facility] Cardiac Rehabilitation.  Dr. Vida Rigger is Medical Director for [facility] Pulmonary Rehabilitation.

## 2023-06-25 ENCOUNTER — Ambulatory Visit: Payer: Medicare Other | Admitting: Dermatology

## 2023-06-30 ENCOUNTER — Encounter: Payer: Medicare Other | Admitting: *Deleted

## 2023-06-30 DIAGNOSIS — I5022 Chronic systolic (congestive) heart failure: Secondary | ICD-10-CM

## 2023-06-30 NOTE — Progress Notes (Signed)
 Daily Session Note  Patient Details  Name: Thomas [name] MRN: [id number] Date of Birth: 11/09/1932 Referring Provider:   Flowsheet Row Cardiac Rehab from 05/19/2023 in [facility] Cardiac and Pulmonary Rehab  Referring Provider Dr. Lorine Bears       Encounter Date: 06/30/2023  Check In:  Session Check In - 06/30/23 1354       Check-In   Supervising physician immediately available to respond to emergencies See telemetry face sheet for immediately available ER MD    Location [facility]-Cardiac & Pulmonary Rehab    Staff Present Susann Givens RN,BSN;Joseph [facility] BS, Exercise Physiologist;Noah Tickle, BS, Exercise Physiologist    Virtual Visit No    Medication changes reported     No    Fall or balance concerns reported    No    Warm-up and Cool-down Performed on first and last piece of equipment    Resistance Training Performed Yes    VAD Patient? No    PAD/SET Patient? No      Pain Assessment   Currently in Pain? No/denies                Social History   Tobacco Use  Smoking Status Never  Smokeless Tobacco Never    Goals Met:  Independence with exercise equipment Exercise tolerated well No report of concerns or symptoms today Strength training completed today  Goals Unmet:  Not Applicable  Comments: Pt able to follow exercise prescription today without complaint.  Will continue to monitor for progression.    Dr. Bethann Punches is Medical Director for [facility] Cardiac Rehabilitation.  Dr. Vida Rigger is Medical Director for [facility] Pulmonary Rehabilitation.

## 2023-07-01 ENCOUNTER — Encounter: Payer: Self-pay | Admitting: Internal Medicine

## 2023-07-01 ENCOUNTER — Encounter: Payer: Self-pay | Admitting: Cardiovascular Disease

## 2023-07-02 ENCOUNTER — Encounter: Payer: Medicare Other | Admitting: *Deleted

## 2023-07-02 DIAGNOSIS — I5022 Chronic systolic (congestive) heart failure: Secondary | ICD-10-CM

## 2023-07-02 NOTE — Progress Notes (Signed)
 Daily Session Note  Patient Details  Name: SIDDIQ KALUZNY MRN: [id number] Date of Birth: Sep 10, 1932 Referring Provider:   Flowsheet Row Cardiac Rehab from 05/19/2023 in [facility] Cardiac and Pulmonary Rehab  Referring Provider Dr. Lorine Bears       Encounter Date: 07/02/2023  Check In:  Session Check In - 07/02/23 1400       Check-In   Supervising physician immediately available to respond to emergencies See telemetry face sheet for immediately available ER MD    Location [facility]-Cardiac & Pulmonary Rehab    Staff Present Susann Givens RN,BSN;Maxon Conetta BS, Exercise Physiologist;Margaret Best, MS, Exercise Physiologist;Noah Tickle, BS, Exercise Physiologist    Virtual Visit No    Medication changes reported     No    Fall or balance concerns reported    No    Warm-up and Cool-down Performed on first and last piece of equipment    Resistance Training Performed Yes    VAD Patient? No    PAD/SET Patient? No      Pain Assessment   Currently in Pain? No/denies                Social History   Tobacco Use  Smoking Status Never  Smokeless Tobacco Never    Goals Met:  Independence with exercise equipment Exercise tolerated well No report of concerns or symptoms today Strength training completed today  Goals Unmet:  Not Applicable  Comments: Pt able to follow exercise prescription today without complaint.  Will continue to monitor for progression.    Dr. Bethann Punches is Medical Director for [facility] Cardiac Rehabilitation.  Dr. Vida Rigger is Medical Director for [facility] Pulmonary Rehabilitation.

## 2023-07-03 ENCOUNTER — Encounter: Payer: Medicare Other | Admitting: *Deleted

## 2023-07-03 DIAGNOSIS — I5022 Chronic systolic (congestive) heart failure: Secondary | ICD-10-CM

## 2023-07-03 NOTE — Progress Notes (Signed)
 Daily Session Note  Patient Details  Name: Thomas [name] MRN: [id number] Date of Birth: 24-Aug-1932 Referring Provider:   Flowsheet Row Cardiac Rehab from 05/19/2023 in [facility] Cardiac and Pulmonary Rehab  Referring Provider Dr. Lorine Bears       Encounter Date: 07/03/2023  Check In:  Session Check In - 07/03/23 1405       Check-In   Supervising physician immediately available to respond to emergencies See telemetry face sheet for immediately available ER MD    Location [facility]-Cardiac & Pulmonary Rehab    Staff Present Susann Givens RN,BSN;Joseph Weyman [place], [place], Exercise Physiologist    Virtual Visit No    Medication changes reported     No    Fall or balance concerns reported    No    Warm-up and Cool-down Performed on first and last piece of equipment    Resistance Training Performed Yes    VAD Patient? No    PAD/SET Patient? No      Pain Assessment   Currently in Pain? No/denies                Social History   Tobacco Use  Smoking Status Never  Smokeless Tobacco Never    Goals Met:  Independence with exercise equipment Exercise tolerated well No report of concerns or symptoms today Strength training completed today  Goals Unmet:  Not Applicable  Comments: Pt able to follow exercise prescription today without complaint.  Will continue to monitor for progression.    Dr. Bethann Punches is Medical Director for [facility] Cardiac Rehabilitation.  Dr. Vida Rigger is Medical Director for [facility] Pulmonary Rehabilitation.

## 2023-07-04 NOTE — Telephone Encounter (Signed)
 Spoke with patient's wife, Thomas [name] ([place]), to address concerns. Apologized for their experience and the delay in receiving pt's split night results. Confirmed that I will fax records to Dr. Elisabeth Cara office today. Thomas [name] wishes to wait on decision regarding CPAP/BiPAP titration vs. Auto-PAP until after pt's appointment with Dr. Sullivan Lone on Friday, 07/11/23. Thomas [name] expressed appreciation of call and denies additional questions or concerns at this time.

## 2023-07-04 NOTE — Telephone Encounter (Signed)
 Faxed sleep study results to Dr. Elisabeth Cara office as requested at 16:41. Confirmation received.

## 2023-07-07 ENCOUNTER — Encounter: Payer: Medicare Other | Attending: Cardiovascular Disease | Admitting: *Deleted

## 2023-07-07 DIAGNOSIS — I5022 Chronic systolic (congestive) heart failure: Secondary | ICD-10-CM | POA: Insufficient documentation

## 2023-07-07 DIAGNOSIS — Z5189 Encounter for other specified aftercare: Secondary | ICD-10-CM | POA: Diagnosis not present

## 2023-07-07 NOTE — Progress Notes (Signed)
 Daily Session Note  Patient Details  Name: Thomas [name] MRN: [id number] Date of Birth: [birth year] Referring Provider:   Flowsheet Row Cardiac Rehab from 05/19/2023 in [facility] Cardiac and Pulmonary Rehab  Referring Provider Dr. Lorine Bears       Encounter Date: 07/07/2023  Check In:  Session Check In - 07/07/23 1401       Check-In   Supervising physician immediately available to respond to emergencies See telemetry face sheet for immediately available ER MD    Location [facility]-Cardiac & Pulmonary Rehab    Staff Present Susann Givens RN,BSN;Joseph Hollace Kinnier;Betsy Coder PhD, RN,CNS,CEN;Noah Tickle, BS, Exercise Physiologist    Virtual Visit No    Medication changes reported     No    Fall or balance concerns reported    No    Warm-up and Cool-down Performed on first and last piece of equipment    Resistance Training Performed Yes    VAD Patient? No    PAD/SET Patient? No      Pain Assessment   Currently in Pain? No/denies                Social History   Tobacco Use  Smoking Status Never  Smokeless Tobacco Never    Goals Met:  Independence with exercise equipment Exercise tolerated well No report of concerns or symptoms today Strength training completed today  Goals Unmet:  Not Applicable  Comments: Pt able to follow exercise prescription today without complaint.  Will continue to monitor for progression.    Dr. Bethann Punches is Medical Director for [facility] Cardiac Rehabilitation.  Dr. Vida Rigger is Medical Director for [facility] Pulmonary Rehabilitation.

## 2023-07-08 ENCOUNTER — Telehealth: Payer: Self-pay

## 2023-07-08 NOTE — Telephone Encounter (Signed)
 Spoke with patient's wife, per DPR. Notified wife that PA was approved for CPAP Titration. Number given to wife for scheduling.

## 2023-07-09 ENCOUNTER — Ambulatory Visit: Payer: Medicare Other | Admitting: Dermatology

## 2023-07-09 DIAGNOSIS — Z85828 Personal history of other malignant neoplasm of skin: Secondary | ICD-10-CM | POA: Diagnosis not present

## 2023-07-09 DIAGNOSIS — D1801 Hemangioma of skin and subcutaneous tissue: Secondary | ICD-10-CM

## 2023-07-09 DIAGNOSIS — Z8619 Personal history of other infectious and parasitic diseases: Secondary | ICD-10-CM

## 2023-07-09 DIAGNOSIS — L814 Other melanin hyperpigmentation: Secondary | ICD-10-CM | POA: Diagnosis not present

## 2023-07-09 DIAGNOSIS — L578 Other skin changes due to chronic exposure to nonionizing radiation: Secondary | ICD-10-CM

## 2023-07-09 DIAGNOSIS — L821 Other seborrheic keratosis: Secondary | ICD-10-CM

## 2023-07-09 DIAGNOSIS — D485 Neoplasm of uncertain behavior of skin: Secondary | ICD-10-CM

## 2023-07-09 DIAGNOSIS — L820 Inflamed seborrheic keratosis: Secondary | ICD-10-CM | POA: Diagnosis not present

## 2023-07-09 DIAGNOSIS — W908XXA Exposure to other nonionizing radiation, initial encounter: Secondary | ICD-10-CM

## 2023-07-09 DIAGNOSIS — D492 Neoplasm of unspecified behavior of bone, soft tissue, and skin: Secondary | ICD-10-CM

## 2023-07-09 MED ORDER — TERBINAFINE HCL 250 MG PO TABS: 250.0000 mg | ORAL_TABLET | Freq: Every day | ORAL | 1 refills | Status: DC

## 2023-07-09 NOTE — Progress Notes (Signed)
 Follow-Up Visit   Subjective  Thomas [name] is a 89 y.o. male who presents for the following: Spot on the back that gets irritated and sore x 2 months. He has several other spots on his arms he would like checked. History of SCC and BCCs.  The patient presents for Upper Body Skin Exam (UBSE) for skin cancer screening and mole check.  The patient has spots, moles and lesions to be evaluated, some may be new or changing and the patient has concerns that these could be cancer. Patient has tinea unguium of the toenails and took PO terbinafine x 1 month- didn't get rfs and would like those sent in. No side effects.   This patient is accompanied in the office by his spouse.   The following portions of the chart were reviewed this encounter and updated as appropriate: medications, allergies, medical history  Review of Systems:  No other skin or systemic complaints except as noted in HPI or Assessment and Plan.  Objective  Well appearing patient in no apparent distress; mood and affect are within normal limits.  A focused examination was performed of the following areas: Face, back  Relevant physical exam findings are noted in the Assessment and Plan.  Left Upper Back Erythematous stuck-on, waxy papule Left posterior shoulder 0.5 cm irregular brown macule    Assessment & Plan  ACTINIC DAMAGE - chronic, secondary to cumulative UV radiation exposure/sun exposure over time - diffuse scaly erythematous macules with underlying dyspigmentation - Recommend daily broad spectrum sunscreen SPF 30+ to sun-exposed areas, reapply every 2 hours as needed.  - Recommend staying in the shade or wearing long sleeves, sun glasses (UVA+UVB protection) and wide brim hats (4-inch brim around the entire circumference of the hat). - Call for new or changing lesions.  HISTORY OF SQUAMOUS CELL CARCINOMA OF THE SKIN - No evidence of recurrence today - Recommend regular full body skin exams - Recommend daily  broad spectrum sunscreen SPF 30+ to sun-exposed areas, reapply every 2 hours as needed.  - Call if any new or changing lesions are noted between office visits   HISTORY OF BASAL CELL CARCINOMA OF THE SKIN - No evidence of recurrence today - Recommend regular full body skin exams - Recommend daily broad spectrum sunscreen SPF 30+ to sun-exposed areas, reapply every 2 hours as needed.  - Call if any new or changing lesions are noted between office visits  LENTIGINES Exam: scattered tan macules Due to sun exposure Treatment Plan: Benign-appearing, observe. Recommend daily broad spectrum sunscreen SPF 30+ to sun-exposed areas, reapply every 2 hours as needed.  Call for any changes  SEBORRHEIC KERATOSIS - Stuck-on, waxy, tan-brown papules and/or plaques  - Benign-appearing - Discussed benign etiology and prognosis. - Observe - Call for any changes  HEMANGIOMA Exam: red papule(s) Discussed benign nature. Recommend observation. Call for changes.  History of Lyme disease  Patient is currently on intermittent oral metronidazole treatment.  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    INFLAMED SEBORRHEIC KERATOSIS Left Upper Back Symptomatic, irritating, patient would like treated. Destruction of lesion - Left Upper Back  Destruction method: cryotherapy   Informed consent: discussed and consent obtained   Lesion destroyed using liquid nitrogen: Yes   Region frozen until ice ball extended beyond lesion: Yes   Outcome: patient tolerated procedure well with no complications   Post-procedure details: wound care instructions given   Additional details:  Prior to procedure, discussed risks  of blister formation, small wound, skin dyspigmentation, or rare scar following cryotherapy. Recommend Vaseline ointment to treated areas while healing.  NEOPLASM OF UNCERTAIN BEHAVIOR OF SKIN Left posterior shoulder Epidermal / dermal shaving  Lesion diameter (cm):   0.6 Informed consent: discussed and consent obtained   Patient was prepped and draped in usual sterile fashion: Area prepped with alcohol. Anesthesia: the lesion was anesthetized in a standard fashion   Anesthetic:  1% lidocaine w/ epinephrine 1-100,000 buffered w/ 8.4% NaHCO3 Instrument used: flexible razor blade   Hemostasis achieved with: pressure, aluminum chloride and electrodesiccation   Outcome: patient tolerated procedure well   Post-procedure details: wound care instructions given   Post-procedure details comment:  Ointment and small bandage applied Specimen 1 - Surgical pathology Differential Diagnosis: Lentigo r/o atypia Check Margins: Yes   Return in about 2 months (around 09/08/2023) for with Dr Kirtland Bouchard for AK and tinea f/u. Additional 2 rfs terbinafine sent in today  I, Cherlyn Labella, CMA, am acting as scribe for Willeen Niece, MD .   Documentation: I have reviewed the above documentation for accuracy and completeness, and I agree with the above.  Willeen Niece, MD

## 2023-07-09 NOTE — Patient Instructions (Addendum)

## 2023-07-10 ENCOUNTER — Encounter: Payer: Medicare Other | Admitting: *Deleted

## 2023-07-10 DIAGNOSIS — I5022 Chronic systolic (congestive) heart failure: Secondary | ICD-10-CM | POA: Diagnosis not present

## 2023-07-10 DIAGNOSIS — Z5189 Encounter for other specified aftercare: Secondary | ICD-10-CM | POA: Diagnosis not present

## 2023-07-10 NOTE — Progress Notes (Signed)
 Daily Session Note  Patient Details  Name: Thomas [name] MRN: [id number] Date of Birth: 1933/04/17 Referring Provider:   Flowsheet Row Cardiac Rehab from 05/19/2023 in [facility] Cardiac and Pulmonary Rehab  Referring Provider Dr. Lorine Bears       Encounter Date: 07/10/2023  Check In:  Session Check In - 07/10/23 1403       Check-In   Supervising physician immediately available to respond to emergencies See telemetry face sheet for immediately available ER MD    Location [facility]-Cardiac & Pulmonary Rehab    Staff Present Susann Givens RN,BSN;Joseph Reino Kent RCP,RRT,BSRT    Virtual Visit No    Medication changes reported     No    Fall or balance concerns reported    No    Warm-up and Cool-down Performed on first and last piece of equipment    Resistance Training Performed Yes    VAD Patient? No    PAD/SET Patient? No      Pain Assessment   Currently in Pain? No/denies                Social History   Tobacco Use  Smoking Status Never  Smokeless Tobacco Never    Goals Met:  Independence with exercise equipment Exercise tolerated well No report of concerns or symptoms today Strength training completed today  Goals Unmet:  Not Applicable  Comments: Pt able to follow exercise prescription today without complaint.  Will continue to monitor for progression.    Dr. Bethann Punches is Medical Director for [facility] Cardiac Rehabilitation.  Dr. Vida Rigger is Medical Director for [facility] Pulmonary Rehabilitation.

## 2023-07-11 DIAGNOSIS — Z85528 Personal history of other malignant neoplasm of kidney: Secondary | ICD-10-CM | POA: Diagnosis not present

## 2023-07-11 DIAGNOSIS — G4733 Obstructive sleep apnea (adult) (pediatric): Secondary | ICD-10-CM | POA: Diagnosis not present

## 2023-07-11 DIAGNOSIS — I110 Hypertensive heart disease with heart failure: Secondary | ICD-10-CM | POA: Diagnosis not present

## 2023-07-11 DIAGNOSIS — L84 Corns and callosities: Secondary | ICD-10-CM | POA: Diagnosis not present

## 2023-07-11 DIAGNOSIS — S22001D Stable burst fracture of unspecified thoracic vertebra, subsequent encounter for fracture with routine healing: Secondary | ICD-10-CM | POA: Diagnosis not present

## 2023-07-11 DIAGNOSIS — Z8619 Personal history of other infectious and parasitic diseases: Secondary | ICD-10-CM | POA: Diagnosis not present

## 2023-07-11 DIAGNOSIS — I5043 Acute on chronic combined systolic (congestive) and diastolic (congestive) heart failure: Secondary | ICD-10-CM | POA: Diagnosis not present

## 2023-07-11 DIAGNOSIS — I480 Paroxysmal atrial fibrillation: Secondary | ICD-10-CM | POA: Diagnosis not present

## 2023-07-11 LAB — SURGICAL PATHOLOGY

## 2023-07-14 ENCOUNTER — Telehealth: Payer: Self-pay

## 2023-07-14 ENCOUNTER — Encounter: Payer: Medicare Other | Admitting: *Deleted

## 2023-07-14 DIAGNOSIS — Z5189 Encounter for other specified aftercare: Secondary | ICD-10-CM | POA: Diagnosis not present

## 2023-07-14 DIAGNOSIS — I5022 Chronic systolic (congestive) heart failure: Secondary | ICD-10-CM

## 2023-07-14 NOTE — Telephone Encounter (Signed)
-----   Message from Willeen Niece sent at 07/14/2023 10:20 AM EDT ----- 1. Skin, left posterior shoulder :       PIGMENTED SEBORRHEIC KERATOSIS, INFLAMED    Benign ISK - please call patient

## 2023-07-14 NOTE — Progress Notes (Signed)
 Daily Session Note  Patient Details  Name: Thomas [name] MRN: [id number] Date of Birth: [birth year] Referring Provider:   Flowsheet Row Cardiac Rehab from 05/19/2023 in [facility] Cardiac and Pulmonary Rehab  Referring Provider Dr. Lorine Bears       Encounter Date: 07/14/2023  Check In:  Session Check In - 07/14/23 1404       Check-In   Supervising physician immediately available to respond to emergencies See telemetry face sheet for immediately available ER MD    Location [facility]-Cardiac & Pulmonary Rehab    Staff Present Susann Givens RN,BSN;Joseph Weyman Pedro, [place], Exercise Physiologist    Virtual Visit No    Medication changes reported     No    Fall or balance concerns reported    No    Warm-up and Cool-down Performed on first and last piece of equipment    Resistance Training Performed Yes    VAD Patient? No    PAD/SET Patient? No      Pain Assessment   Currently in Pain? No/denies                Social History   Tobacco Use  Smoking Status Never  Smokeless Tobacco Never    Goals Met:  Independence with exercise equipment Exercise tolerated well No report of concerns or symptoms today Strength training completed today  Goals Unmet:  Not Applicable  Comments: Pt able to follow exercise prescription today without complaint.  Will continue to monitor for progression.    Dr. Bethann Punches is Medical Director for [facility] Cardiac Rehabilitation.  Dr. Vida Rigger is Medical Director for [facility] Pulmonary Rehabilitation.

## 2023-07-14 NOTE — Telephone Encounter (Signed)
 Left message for patient advising biopsy of the left posterior shoulder was benign ISK and no further treatment needed. Call back with any questions or concerns.

## 2023-07-15 ENCOUNTER — Ambulatory Visit: Payer: Medicare Other | Attending: Internal Medicine

## 2023-07-15 DIAGNOSIS — I4819 Other persistent atrial fibrillation: Secondary | ICD-10-CM | POA: Diagnosis not present

## 2023-07-15 DIAGNOSIS — I5022 Chronic systolic (congestive) heart failure: Secondary | ICD-10-CM | POA: Diagnosis not present

## 2023-07-15 DIAGNOSIS — I4729 Other ventricular tachycardia: Secondary | ICD-10-CM | POA: Diagnosis not present

## 2023-07-15 LAB — ECHOCARDIOGRAM COMPLETE
AR max vel: 1.6 cm2
AV Area VTI: 1.61 cm2
AV Area mean vel: 1.64 cm2
AV Mean grad: 5.3 mmHg
AV Peak grad: 9.9 mmHg
AV Vena cont: 0.4 cm
Ao pk vel: 1.58 m/s
Area-P 1/2: 3.08 cm2
Calc EF: 48.2 %
P 1/2 time: 342 ms
S' Lateral: 3.8 cm
Single Plane A2C EF: 49.5 %
Single Plane A4C EF: 46.7 %

## 2023-07-16 ENCOUNTER — Encounter: Payer: Medicare Other | Admitting: *Deleted

## 2023-07-16 DIAGNOSIS — I5022 Chronic systolic (congestive) heart failure: Secondary | ICD-10-CM | POA: Diagnosis not present

## 2023-07-16 DIAGNOSIS — Z5189 Encounter for other specified aftercare: Secondary | ICD-10-CM | POA: Diagnosis not present

## 2023-07-16 NOTE — Progress Notes (Signed)
 Daily Session Note  Patient Details  Name: [name] MRN: [id number] Date of Birth: May 09, 1932 Referring Provider:   Flowsheet Row Cardiac Rehab from 05/19/2023 in [facility] Cardiac and Pulmonary Rehab  Referring Provider Dr. Lorine Bears       Encounter Date: 07/16/2023  Check In:  Session Check In - 07/16/23 1359       Check-In   Supervising physician immediately available to respond to emergencies See telemetry face sheet for immediately available ER MD    Location [facility]-Cardiac & Pulmonary Rehab    Staff Present Susann Givens RN,BSN;Noah Tickle, BS, Exercise Physiologist;Kelly Cloretta Ned, ACSM CEP, Exercise Physiologist;Susanne Bice, RN, BSN, CCRP    Virtual Visit No    Medication changes reported     No    Fall or balance concerns reported    No    Warm-up and Cool-down Performed on first and last piece of equipment    Resistance Training Performed Yes    VAD Patient? No    PAD/SET Patient? No      Pain Assessment   Currently in Pain? No/denies                Social History   Tobacco Use  Smoking Status Never  Smokeless Tobacco Never    Goals Met:  Independence with exercise equipment Exercise tolerated well No report of concerns or symptoms today Strength training completed today  Goals Unmet:  Not Applicable  Comments: Pt able to follow exercise prescription today without complaint.  Will continue to monitor for progression.    Dr. Bethann Punches is Medical Director for [facility] Cardiac Rehabilitation.  Dr. Vida Rigger is Medical Director for [facility] Pulmonary Rehabilitation.

## 2023-07-16 NOTE — Progress Notes (Signed)
 Cardiac Individual Treatment Plan  Patient Details  Name: Thomas [name] MRN: [id number] Date of Birth: 08/13/1932 Referring Provider:   Flowsheet Row Cardiac Rehab from 05/19/2023 in [facility] Cardiac and Pulmonary Rehab  Referring Provider Dr. Lorine Bears       Initial Encounter Date:  Flowsheet Row Cardiac Rehab from 05/19/2023 in [facility] Cardiac and Pulmonary Rehab  Date 05/19/23       Visit Diagnosis: Heart failure, chronic systolic (HCC)  Patient's Home Medications on Admission:  Current Outpatient Medications:    acetaminophen (TYLENOL) 500 MG tablet, Take 500-1,000 mg by mouth every 6 (six) hours as needed for mild pain (pain score 1-3) or fever., Disp: , Rfl:    apixaban (ELIQUIS) 2.5 MG TABS tablet, Take 1 tablet (2.5 mg total) by mouth 2 (two) times daily., Disp: 90 tablet, Rfl: 3   Artificial Tears ophthalmic solution, Place 1 drop into both eyes in the morning, at noon, in the evening, and at bedtime., Disp: , Rfl:    atorvastatin (LIPITOR) 40 MG tablet, Take 1 tablet (40 mg total) by mouth daily., Disp: 90 tablet, Rfl: 3   buPROPion (WELLBUTRIN XL) 150 MG 24 hr tablet, Take 150 mg by mouth daily., Disp: , Rfl:    calcium carbonate (TUMS - DOSED IN MG ELEMENTAL CALCIUM) 500 MG chewable tablet, Chew 1-2 tablets by mouth daily as needed for indigestion., Disp: , Rfl:    Coenzyme Q10 (COQ10) 100 MG CAPS, Take 100 mg by mouth daily., Disp: , Rfl:    colchicine 0.6 MG tablet, Take 0.6 mg by mouth daily as needed (for gout flares)., Disp: , Rfl:    docusate sodium (COLACE) 100 MG capsule, Take 100 mg by mouth daily as needed for mild constipation., Disp: , Rfl:    dofetilide (TIKOSYN) 125 MCG capsule, Take 1 capsule (125 mcg total) by mouth 2 (two) times daily., Disp: 180 capsule, Rfl: 2   DULoxetine (CYMBALTA) 20 MG capsule, Take 20 mg by mouth 2 (two) times daily., Disp: , Rfl:    febuxostat (ULORIC) 40 MG tablet, Take 1 tablet (40 mg total) by mouth daily., Disp: 90 tablet, Rfl:  3   gabapentin (NEURONTIN) 300 MG capsule, Take 300 mg by mouth at bedtime., Disp: , Rfl:    hydrALAZINE (APRESOLINE) 25 MG tablet, Take 1 tablet (25 mg total) by mouth as needed (for a Systolic reading of 160 that will not decrease after 30 minutes rest.)., Disp: 30 tablet, Rfl: 0   methocarbamol (ROBAXIN) 500 MG tablet, Take 500 mg by mouth every 6 (six) hours as needed for muscle spasms., Disp: , Rfl:    metoprolol succinate (TOPROL-XL) 50 MG 24 hr tablet, TAKE 1 TABLET BY MOUTH DAILY WITH OR IMMEDIATELY FOLLOWING A MEAL (Patient taking differently: Take 50 mg by mouth at bedtime.), Disp: 90 tablet, Rfl: 3   metroNIDAZOLE (FLAGYL) 500 MG tablet, TAKE 1 TABLET BY MOUTH TWICE DAILY ON SUNDAY AND MONDAY. (Patient taking differently: Take 1,000 mg by mouth See admin instructions. Take 1,000 mg by mouth on Saturday and Sunday- EVERY OTHER WEEKEND), Disp: 90 tablet, Rfl: 3   NEEDLE, DISP, 18 G (BD DISP NEEDLES) 18G X 1-1/2" MISC, 1 mg by Does not apply route every 14 (fourteen) days., Disp: 50 each, Rfl: 0   NEEDLE, DISP, 21 G (BD DISP NEEDLES) 21G X 1-1/2" MISC, 1 mg by Does not apply route every 14 (fourteen) days., Disp: 50 each, Rfl: 0   omeprazole (PRILOSEC) 20 MG capsule, Take 20 mg by  mouth daily as needed (for heartburn- 30 minutes before a meal)., Disp: , Rfl:    sacubitril-valsartan (ENTRESTO) 24-26 MG, Take 1 tablet by mouth 2 (two) times daily., Disp: 180 tablet, Rfl: 3   Syringe, Disposable, (2-3CC SYRINGE) 3 ML MISC, 1 mg by Does not apply route every 14 (fourteen) days., Disp: 25 each, Rfl: 3   terbinafine (LAMISIL) 250 MG tablet, Take 1 tablet (250 mg total) by mouth daily., Disp: 30 tablet, Rfl: 1   Testosterone 1.62 % GEL, APPLY 2 PUMPS DAILY (Patient taking differently: Apply 2 Pump topically daily.), Disp: 75 g, Rfl: 5   testosterone cypionate (DEPOTESTOSTERONE CYPIONATE) 200 MG/ML injection, Inject 1 mL (200 mg total) into the muscle every 28 (twenty-eight) days., Disp: 10 mL, Rfl:  0  Past Medical History: Past Medical History:  Diagnosis Date   Aortic valve disorders    Arthritis    Atrial flutter (HCC) 06/18/2016   "AF or AFl; not sure which" (06/23/2016)   Basal cell carcinoma    "face, nose left shoulder, left arm" (06/19/2016)   Basal cell carcinoma 09/13/2020   right temple   BBB (bundle branch block)    hx right   Chronic back pain    "neck, thoracic, lower back" (06/19/2016)   Complete heart block (HCC) 06/2016   Dyspnea    GERD (gastroesophageal reflux disease)    Gout    Heart block    "I've had type I, II Wenke before now" (06/19/2016)   History of gout    History of hiatal hernia    "self dx'd" (06/19/2016)   Hyperlipidemia    Hypertension    Lyme disease    "dx'd by me 2003; cx's showed dx 08/2015"   Migraine    "3-4/year" (06/19/2016)   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   PVC's (premature ventricular contractions)    Renal cancer, left (HCC) 2006   S/P cryotherapy   Spinal stenosis    "cervical, 1 thoracic, lumbar" (06/19/2016)   Squamous carcinoma    "face, nose left shoulder, left arm" (06/19/2016)   Stroke (HCC)    TIA (transient ischemic attack) 06/14/2016   "I'm not sure that's what it was" (06/25/2016)   Visit for monitoring Tikosyn therapy 09/09/2017    Tobacco Use: Social History   Tobacco Use  Smoking Status Never  Smokeless Tobacco Never    Labs: Review Flowsheet  More data exists      Latest Ref Rng & Units 02/07/2020 11/01/2020 08/21/2021 01/09/2022 11/25/2022  Labs for ITP Cardiac and Pulmonary Rehab  Cholestrol 100 - 199 mg/dL 161  096  - 045  -  LDL (calc) 0 - 99 mg/dL 76  70  - 56  -  HDL-C >39 mg/dL 53  41  - 67  -  Trlycerides 0 - 149 mg/dL 47  69  - 39  -  Hemoglobin A1c 4.8 - 5.6 % - - 5.7  5.7  -  PH, Arterial 7.35 - 7.45 - - - - 7.376   PCO2 arterial 32 - 48 mmHg - - - - 41.7   Bicarbonate 20.0 - 28.0 mmol/L - - - - 25.5  24.4   TCO2 22 - 32 mmol/L - - -  - 27  26   Acid-base deficit 0.0 - 2.0 mmol/L - - - - 1.0   O2 Saturation % - - - - 70  88     Details  Multiple values from one day are sorted in reverse-chronological order          Exercise Target Goals: Exercise Program Goal: Individual exercise prescription set using results from initial 6 min walk test and THRR while considering  patient's activity barriers and safety.   Exercise Prescription Goal: Initial exercise prescription builds to 30-45 minutes a day of aerobic activity, 2-3 days per week.  Home exercise guidelines will be given to patient during program as part of exercise prescription that the participant will acknowledge.   Education: Aerobic Exercise: - Group verbal and visual presentation on the components of exercise prescription. Introduces F.I.T.T principle from ACSM for exercise prescriptions.  Reviews F.I.T.T. principles of aerobic exercise including progression. Written material given at graduation.   Education: Resistance Exercise: - Group verbal and visual presentation on the components of exercise prescription. Introduces F.I.T.T principle from ACSM for exercise prescriptions  Reviews F.I.T.T. principles of resistance exercise including progression. Written material given at graduation.    Education: Exercise & Equipment Safety: - Individual verbal instruction and demonstration of equipment use and safety with use of the equipment. Flowsheet Row Cardiac Rehab from 07/02/2023 in [facility] Cardiac and Pulmonary Rehab  Date 05/19/23  Educator [facility]  Instruction Review Code 1- Verbalizes Understanding       Education: Exercise Physiology & General Exercise Guidelines: - Group verbal and written instruction with models to review the exercise physiology of the cardiovascular system and associated critical values. Provides general exercise guidelines with specific guidelines to those with heart or lung disease.    Education: Flexibility, Balance, Mind/Body  Relaxation: - Group verbal and visual presentation with interactive activity on the components of exercise prescription. Introduces F.I.T.T principle from ACSM for exercise prescriptions. Reviews F.I.T.T. principles of flexibility and balance exercise training including progression. Also discusses the mind body connection.  Reviews various relaxation techniques to help reduce and manage stress (i.e. Deep breathing, progressive muscle relaxation, and visualization). Balance handout provided to take home. Written material given at graduation.   Activity Barriers & Risk Stratification:  Activity Barriers & Cardiac Risk Stratification - 05/19/23 1526       Activity Barriers & Cardiac Risk Stratification   Activity Barriers Back Problems;Muscular Weakness    Cardiac Risk Stratification High             6 Minute Walk:  6 Minute Walk     Row Name 05/19/23 1525         6 Minute Walk   Phase Initial     Distance 1360 feet     Walk Time 6 minutes     # of Rest Breaks 0     MPH 2.58     METS 2.34     RPE 11     Perceived Dyspnea  0     VO2 Peak 8.21     Symptoms No     Resting HR 84 bpm     Resting BP 118/78     Resting Oxygen Saturation  98 %     Exercise Oxygen Saturation  during 6 min walk 92 %     Max Ex. HR 117 bpm     Max Ex. BP 132/70     2 Minute Post BP 120/80              Oxygen Initial Assessment:   Oxygen Re-Evaluation:   Oxygen Discharge (Final Oxygen Re-Evaluation):   Initial Exercise Prescription:  Initial Exercise Prescription - 05/19/23 1500  Date of Initial Exercise RX and Referring Provider   Date 05/19/23    Referring Provider Dr. Lorine Bears      Oxygen   Maintain Oxygen Saturation 88% or higher      Treadmill   MPH 1.8    Grade 1    Minutes 15    METs 2.63      Recumbant Bike   Level 3    RPM 50    Watts 20    Minutes 15    METs 2.34      NuStep   Level 3    SPM 80    Minutes 15    METs 2.34      REL-XR    Level 3    Speed 50    Minutes 15    METs 2.34      Biostep-RELP   Level 3    SPM 50    Minutes 15    METs 2.34      Prescription Details   Frequency (times per week) 3    Duration Progress to 30 minutes of continuous aerobic without signs/symptoms of physical distress      Intensity   THRR 40-80% of Max Heartrate 103-122    Ratings of Perceived Exertion 11-13    Perceived Dyspnea 0-4      Progression   Progression Continue to progress workloads to maintain intensity without signs/symptoms of physical distress.      Resistance Training   Training Prescription Yes    Weight 7    Reps 10-15             Perform Capillary Blood Glucose checks as needed.  Exercise Prescription Changes:   Exercise Prescription Changes     Row Name 05/19/23 1500 06/02/23 1500 06/17/23 1500 06/30/23 1400 07/02/23 0800     Response to Exercise   Blood Pressure (Admit) 118/78 106/60 136/66 -- 152/80   Blood Pressure (Exercise) 132/70 122/56 155/66 -- 152/74   Blood Pressure (Exit) 120/80 126/60 126/64 -- 130/66   Heart Rate (Admit) 84 bpm 89 bpm 82 bpm -- 50 bpm   Heart Rate (Exercise) 117 bpm 117 bpm 123 bpm -- 128 bpm   Heart Rate (Exit) 66 bpm 67 bpm 70 bpm -- 64 bpm   Oxygen Saturation (Admit) 98 % -- -- -- --   Oxygen Saturation (Exercise) 92 % -- -- -- --   Oxygen Saturation (Exit) 99 % -- -- -- --   Rating of Perceived Exertion (Exercise) 11 13 13  -- 13   Perceived Dyspnea (Exercise) 0 -- 0 -- 0   Symptoms none none none -- none   Comments 6 MWT results first 2 exercise sessions -- -- --   Duration -- Progress to 30 minutes of  aerobic without signs/symptoms of physical distress Progress to 30 minutes of  aerobic without signs/symptoms of physical distress -- Progress to 30 minutes of  aerobic without signs/symptoms of physical distress   Intensity -- THRR unchanged THRR unchanged -- THRR unchanged     Progression   Progression -- Continue to progress workloads to maintain  intensity without signs/symptoms of physical distress. Continue to progress workloads to maintain intensity without signs/symptoms of physical distress. -- Continue to progress workloads to maintain intensity without signs/symptoms of physical distress.   Average METs -- 2.38 2.8 -- 3.45     Resistance Training   Training Prescription -- Yes Yes -- Yes   Weight -- 7 7 -- 7   Reps --  10-15 10-15 -- 10-15     Interval Training   Interval Training -- -- No -- No     Oxygen   Oxygen -- Continuous -- -- --     Treadmill   MPH -- 2.2 2.2 -- 3   Grade -- 1 0 -- 0.5   Minutes -- 15 15 -- 15   METs -- 2.99 -- -- 3.5     NuStep   Level -- 3 -- -- --   SPM -- 80 -- -- --   Minutes -- 15 -- -- --   METs -- 2 -- -- --     REL-XR   Level -- -- 3 -- 4   Minutes -- -- 15 -- 15   METs -- -- 3.2 -- 3.9     T5 Nustep   Level -- 1 -- -- --   SPM -- 80 -- -- --   Minutes -- 15 -- -- --   METs -- 2 -- -- --     Home Exercise Plan   Plans to continue exercise at -- -- -- [facility]  Pt plans to join [employer] for aerobic equipment, Walking dog around 1 mile a day [employer] [facility]  Pt plans to join [place] for aerobic equipment, Walking dog around 1 mile a day   Frequency -- -- -- Add 2 additional days to program exercise sessions. Add 2 additional days to program exercise sessions.   Initial Home Exercises Provided -- -- -- 06/30/23 06/30/23     Oxygen   Maintain Oxygen Saturation -- 88% or higher 88% or higher 88% or higher 88% or higher            Exercise Comments:   Exercise Comments     Row Name 05/21/23 1505           Exercise Comments First full day of exercise!  Patient was oriented to gym and equipment including functions, settings, policies, and procedures.  Patient's individual exercise prescription and treatment plan were reviewed.  All starting workloads were established based on the results of the 6 minute walk test done at initial orientation visit.   The plan for exercise progression was also introduced and progression will be customized based on patient's performance and goals.                Exercise Goals and Review:   Exercise Goals     Row Name 05/19/23 1530             Exercise Goals   Increase Physical Activity Yes       Intervention Provide advice, education, support and counseling about physical activity/exercise needs.;Develop an individualized exercise prescription for aerobic and resistive training based on initial evaluation findings, risk stratification, comorbidities and participant's personal goals.       Expected Outcomes Short Term: Attend rehab on a regular basis to increase amount of physical activity.;Long Term: Add in home exercise to make exercise part of routine and to increase amount of physical activity.;Long Term: Exercising regularly at least 3-5 days a week.       Increase Strength and Stamina Yes       Intervention Provide advice, education, support and counseling about physical activity/exercise needs.;Develop an individualized exercise prescription for aerobic and resistive training based on initial evaluation findings, risk stratification, comorbidities and participant's personal goals.       Expected Outcomes Short Term: Increase workloads from initial exercise prescription for resistance, speed, and METs.;Short  Term: Perform resistance training exercises routinely during rehab and add in resistance training at home;Long Term: Improve cardiorespiratory fitness, muscular endurance and strength as measured by increased METs and functional capacity ( )       Able to understand and use rate of perceived exertion (RPE) scale Yes       Intervention Provide education and explanation on how to use RPE scale       Expected Outcomes Short Term: Able to use RPE daily in rehab to express subjective intensity level;Long Term:  Able to use RPE to guide intensity level when exercising independently       Able to  understand and use Dyspnea scale Yes       Intervention Provide education and explanation on how to use Dyspnea scale       Expected Outcomes Short Term: Able to use Dyspnea scale daily in rehab to express subjective sense of shortness of breath during exertion;Long Term: Able to use Dyspnea scale to guide intensity level when exercising independently       Knowledge and understanding of Target Heart Rate Range (THRR) Yes       Intervention Provide education and explanation of THRR including how the numbers were predicted and where they are located for reference       Expected Outcomes Short Term: Able to state/look up THRR;Long Term: Able to use THRR to govern intensity when exercising independently;Short Term: Able to use daily as guideline for intensity in rehab       Able to check pulse independently Yes       Intervention Provide education and demonstration on how to check pulse in carotid and radial arteries.;Review the importance of being able to check your own pulse for safety during independent exercise       Expected Outcomes Short Term: Able to explain why pulse checking is important during independent exercise;Long Term: Able to check pulse independently and accurately       Understanding of Exercise Prescription Yes       Intervention Provide education, explanation, and written materials on patient's individual exercise prescription       Expected Outcomes Short Term: Able to explain program exercise prescription;Long Term: Able to explain home exercise prescription to exercise independently                Exercise Goals Re-Evaluation :  Exercise Goals Re-Evaluation     Row Name 05/21/23 1508 06/02/23 1558 06/11/23 1406 06/17/23 1531 06/30/23 1429     Exercise Goal Re-Evaluation   Exercise Goals Review Increase Physical Activity;Able to understand and use rate of perceived exertion (RPE) scale;Knowledge and understanding of Target Heart Rate Range (THRR);Understanding of  Exercise Prescription;Increase Strength and Stamina;Able to check pulse independently Increase Physical Activity;Understanding of Exercise Prescription;Increase Strength and Stamina Increase Physical Activity;Increase Strength and Stamina;Understanding of Exercise Prescription Increase Physical Activity;Increase Strength and Stamina;Understanding of Exercise Prescription Increase Physical Activity;Able to understand and use Dyspnea scale;Understanding of Exercise Prescription;Knowledge and understanding of Target Heart Rate Range (THRR);Increase Strength and Stamina;Able to check pulse independently;Able to understand and use rate of perceived exertion (RPE) scale   Comments Reviewed RPE and dyspnea scale, THR and program prescription with pt today.  Pt voiced understanding and was given a copy of goals to take home. Aidyn is off to a good start in the program. He has completed 2 exercise sessions. He did well with his exercise prescription and even increased his treadmill workload to a speed of 2. and incline of 1%.  He also added T5 nustep to his exercise prescription. We will continue to monitor his progress in the program. Aqeel is doing well at rehab, feels he is out of shape but is determined to get back to where he was before the surgeries. Currently on treadmill at 1.8 speed. He is happy with the ability to workout safely while being monitored. Encouraged him to continue to workout here at rehab and doing exercise safely when at home. Devean is doing well in rehab. He was able to maintain his speed on the treadmill but decreased his incline from 1% to 0. He was also able to maintain a level of 3 on the XR. We will continue to encourage and monitor his progression in the program. Reviewed home exercise with pt today.  Pt plans to join the [facility] for the aerobic equipment, and also will continue to walk his dog about 1 mile a day for exercise.  Reviewed THR, pulse, RPE, sign and symptoms, pulse oximetery and  when to call 911 or MD.  Also discussed weather considerations and indoor options.  Pt voiced understanding.   Expected Outcomes Short: Use RPE daily to regulate intensity.  Long: Follow program prescription in THR. Short: Continue to follow current exercise prescription. Long: Continue exercise to improve strength and stamina. STG: continue to come to rehab and increase workload as able. LTG: Continue to improve strenght and stamina Short: Continue to follow exercise prescription and increase workloads when able. Long: Continue exercise to improve strength and stamina. Short: Join [employer] for aerobic equipment and exercise. Long: Continue exercise to improve strength and stamina.    Row Name 07/02/23 2956             Exercise Goal Re-Evaluation   Exercise Goals Review Increase Physical Activity;Increase Strength and Stamina;Understanding of Exercise Prescription       Comments Carol is doing well in rehab. He was able to increase his treadmill workload to a speed of and an incline of 0.5%. He was also able to increase from level 3 to 4 on the XR. We will continue to monitor his progress in the program.       Expected Outcomes Short: Continue to increase treadmill workload. Long: Continue exercise to improve strength and stamina.                Discharge Exercise Prescription (Final Exercise Prescription Changes):  Exercise Prescription Changes - 07/02/23 0800       Response to Exercise   Blood Pressure (Admit) 152/80    Blood Pressure (Exercise) 152/74    Blood Pressure (Exit) 130/66    Heart Rate (Admit) 50 bpm    Heart Rate (Exercise) 128 bpm    Heart Rate (Exit) 64 bpm    Rating of Perceived Exertion (Exercise) 13    Perceived Dyspnea (Exercise) 0    Symptoms none    Duration Progress to 30 minutes of  aerobic without signs/symptoms of physical distress    Intensity THRR unchanged      Progression   Progression Continue to progress workloads to maintain intensity  without signs/symptoms of physical distress.    Average METs 3.45      Resistance Training   Training Prescription Yes    Weight 7    Reps 10-15      Interval Training   Interval Training No      Treadmill   MPH 3    Grade 0.5    Minutes 15    METs 3.5  REL-XR   Level 4    Minutes 15    METs 3.9      Home Exercise Plan   Plans to continue exercise at [facility]   Pt plans to join [facility] for aerobic equipment, Walking dog around 1 mile a day   Frequency Add 2 additional days to program exercise sessions.    Initial Home Exercises Provided 06/30/23      Oxygen   Maintain Oxygen Saturation 88% or higher             Nutrition:  Target Goals: Understanding of nutrition guidelines, daily intake of sodium 1500mg , cholesterol 200mg , calories 30% from fat and 7% or less from saturated fats, daily to have 5 or more servings of fruits and vegetables.  Education: All About Nutrition: -Group instruction provided by verbal, written material, interactive activities, discussions, models, and posters to present general guidelines for heart healthy nutrition including fat, fiber, MyPlate, the role of sodium in heart healthy nutrition, utilization of the nutrition label, and utilization of this knowledge for meal planning. Follow up email sent as well. Written material given at graduation.   Biometrics:  Pre Biometrics - 05/19/23 1531       Pre Biometrics   Height 5' 5.5" (1.664 m)    Weight 148 lb 3.2 oz (67.2 kg)    Waist Circumference 35 inches    Hip Circumference 38 inches    Waist to Hip Ratio 0.92 %    BMI (Calculated) 24.28    Single Leg Stand 2.47 seconds              Nutrition Therapy Plan and Nutrition Goals:  Nutrition Therapy & Goals - 05/19/23 1621       Nutrition Therapy   Diet Cardiac, low Na    Protein (specify units) 60-75g    Fiber 30 grams    Whole Grain Foods 3 servings    Saturated Fats 15 max. grams    Fruits and Vegetables  5 servings/day    Sodium 2 grams      Personal Nutrition Goals   Nutrition Goal Drink 64oz of water daily    Personal Goal #2 Eat 15-20gProtein and 30-60gCarbs at each meal.    Personal Goal #3 Read labels and reduce sodium intake to below 2300mg . Ideally 1500mg  per day.    Comments Patient recovering from hospital stay and reports appetite has improved a lot since his inpatient stay, says it could still use more improvement. Spoke today about ways to meet nutritional needs with less quantity of foods, since her reports some nausea and early satiety. Encouraged him to eat a quality protein ~15-20g per meal paired with a complex carb. Reviewed some good pairings and answered questions around protein, types of fats, sodium, and sugar. Reviewed a few facts labels and provided advice on how to read labels and determine if the foods are valuable for his nutritional goals. Encouraged efficient foods like beans, lentils, and hummus. Healthy fat rich foods like peanuts, peanut butter, fish, and olive oil to increase calories with less quantity of food. Wanted to set goal of ~64oz of water daily, reminded him to ensure water intake doesn't further reduce food intake, suggested he sip on water between meal and keep water at meal to what is needed to enjoy the meal and ensure safe swallowing when appetite is poor. He is optimistic that his appetite will improve and he will be able to eat more normally. Will continue to monitor and support  Intervention Plan   Intervention Prescribe, educate and counsel regarding individualized specific dietary modifications aiming towards targeted core components such as weight, hypertension, lipid management, diabetes, heart failure and other comorbidities.;Nutrition handout(s) given to patient.    Expected Outcomes Short Term Goal: Understand basic principles of dietary content, such as calories, fat, sodium, cholesterol and nutrients.;Short Term Goal: A plan has been  developed with personal nutrition goals set during dietitian appointment.;Long Term Goal: Adherence to prescribed nutrition plan.             Nutrition Assessments:  MEDIFICTS Score Key: >=70 Need to make dietary changes  40-70 Heart Healthy Diet <= 40 Therapeutic Level Cholesterol Diet  Flowsheet Row Cardiac Rehab from 05/19/2023 in [facility] Cardiac and Pulmonary Rehab  Picture Your Plate Total Score on Admission 75      Picture Your Plate Scores: <16 Unhealthy dietary pattern with much room for improvement. 41-50 Dietary pattern unlikely to meet recommendations for good health and room for improvement. 51-60 More healthful dietary pattern, with some room for improvement.  >60 Healthy dietary pattern, although there may be some specific behaviors that could be improved.    Nutrition Goals Re-Evaluation:  Nutrition Goals Re-Evaluation     Row Name 06/11/23 1417 07/10/23 1423           Goals   Comment Bearl reports his appetite is improving and he is tolerating food better. Remembers discussed with RD about meeting protein goals and limiting sodium and saturated fat. Reviewed some good snack ideas with healthy fats, complex carbs and protein rich foods. Raymondo states that he is doing well with his diet. He has been drinking plenty of water every day. He is still working on getting plenty of protein in his diet. He is also trying to limit sugars and sodium in his diet. He feels he is overall doing well with the dietary guidelines he discussed with the RD.      Expected Outcome STG: continue to eat heart healthy and focus on recovering appetite. LTG: maintain heart healthy lifestyle Short: Continue to work on getting protein and limiting sodium. Long: Continue to practice heart healthy eating patterns discussed with RD.               Nutrition Goals Discharge (Final Nutrition Goals Re-Evaluation):  Nutrition Goals Re-Evaluation - 07/10/23 1423       Goals   Comment Draydon states  that he is doing well with his diet. He has been drinking plenty of water every day. He is still working on getting plenty of protein in his diet. He is also trying to limit sugars and sodium in his diet. He feels he is overall doing well with the dietary guidelines he discussed with the RD.    Expected Outcome Short: Continue to work on getting protein and limiting sodium. Long: Continue to practice heart healthy eating patterns discussed with RD.             Psychosocial: Target Goals: Acknowledge presence or absence of significant depression and/or stress, maximize coping skills, provide positive support system. Participant is able to verbalize types and ability to use techniques and skills needed for reducing stress and depression.   Education: Stress, Anxiety, and Depression - Group verbal and visual presentation to define topics covered.  Reviews how body is impacted by stress, anxiety, and depression.  Also discusses healthy ways to reduce stress and to treat/manage anxiety and depression.  Written material given at graduation. Flowsheet Row Cardiac Rehab from 07/02/2023  in [facility] Cardiac and Pulmonary Rehab  Date 07/02/23  Educator [facility]  Instruction Review Code 1- [employer] Understanding       Education: Sleep Hygiene -Provides group verbal and written instruction about how sleep can affect your health.  Define sleep hygiene, discuss sleep cycles and impact of sleep habits. Review good sleep hygiene tips.    Initial Review & Psychosocial Screening:  Initial Psych Review & Screening - 04/21/23 1417       Initial Review   Current issues with Current Psychotropic Meds      Family Dynamics   Concerns Recent loss of significant other    Comments loss of 4 significant family members in the last year      Barriers   Psychosocial barriers to participate in program There are no identifiable barriers or psychosocial needs.;The patient should benefit from training in stress management  and relaxation.      Screening Interventions   Interventions Encouraged to exercise;To provide support and resources with identified psychosocial needs;Provide feedback about the scores to participant    Expected Outcomes Short Term goal: Utilizing psychosocial counselor, staff and physician to assist with identification of specific Stressors or current issues interfering with healing process. Setting desired goal for each stressor or current issue identified.;Long Term Goal: Stressors or current issues are controlled or eliminated.;Short Term goal: Identification and review with participant of any Quality of Life or Depression concerns found by scoring the questionnaire.;Long Term goal: The participant improves quality of Life and PHQ9 Scores as seen by post scores and/or verbalization of changes             Quality of Life Scores:   Quality of Life - 05/19/23 1533       Quality of Life   Select Quality of Life      Quality of Life Scores   Health/Function Pre 24.43 %    Socioeconomic Pre 29.58 %    Psych/Spiritual Pre 24 %    Family Pre 30 %    GLOBAL Pre 26.12 %            Scores of 19 and below usually indicate a poorer quality of life in these areas.  A difference of  2-3 points is a clinically meaningful difference.  A difference of 2-3 points in the total score of the Quality of Life Index has been associated with significant improvement in overall quality of life, self-image, physical symptoms, and general health in studies assessing change in quality of life.  PHQ-9: Review Flowsheet  More data exists      05/19/2023 01/09/2022 09/26/2021 08/21/2021 10/17/2020  Depression screen PHQ 2/9  Decreased Interest 1 0 0 0 0  Down, Depressed, Hopeless 0 0 0 0 0  PHQ - 2 Score 1 0 0 0 0  Altered sleeping 1 0 0 1 0  Tired, decreased energy 1 1 0 2 1  Change in appetite 1 0 0 0 1  Feeling bad or failure about yourself  0 0 0 0 0  Trouble concentrating 0 0 0 0 0  Moving slowly or  fidgety/restless 0 0 0 0 0  Suicidal thoughts 0 0 0 0 0  PHQ-9 Score 4 1 0 3 2  Difficult doing work/chores Somewhat difficult Not difficult at all Not difficult at all Not difficult at all Not difficult at all   Interpretation of Total Score  Total Score Depression Severity:  1-4 = Minimal depression, 5-9 = Mild depression, 10-14 = Moderate depression, 15-19 =  Moderately severe depression, 20-27 = Severe depression   Psychosocial Evaluation and Intervention:  Psychosocial Evaluation - 04/21/23 1442       Psychosocial Evaluation & Interventions   Interventions Encouraged to exercise with the program and follow exercise prescription    Comments Buckley has no barriers to attending the program. He is ready to start and get back to his exercise routine. He lives with his wife and he has her as his support.   He has loss 4 siginificant members of his family in the past year.   He does take wellbutrin and cymbalta,[facility] for "[facility]", the meds are helping as he goes through with all his loss this year.    Expected Outcomes STG attends all scheduled sessions, continues with his meds as prescribed to help  deal with his life changes.  LTG Able to continue to manage with his meds as prescribed    Continue Psychosocial Services  Follow up required by staff             Psychosocial Re-Evaluation:  Psychosocial Re-Evaluation     Row Name 06/11/23 1415 07/10/23 1421           Psychosocial Re-Evaluation   Current issues with None Identified None Identified      Comments Josealberto reports a good mindset and is not exeperiencing any stress, anxiety or depression. He says he has a good support system with family. Says he is sleeping well, wakes up well rested. Eren reportd no major stressors at this time. He enjoys walking and woodworking for stress relief. He has a good support system around him made up primarily of his wife and two daughters. Delmon reports no issues with sleep at  this time.      Expected Outcomes STG: continue to use support system and attend cardiac rehab and education classes. LTG: maintain positive outlook on health and lifestyle changes Short: Continue to exercise for stress relief. Long: Continue to maintain positive outlook.      Interventions Encouraged to attend Cardiac Rehabilitation for the exercise Encouraged to attend Cardiac Rehabilitation for the exercise      Continue Psychosocial Services  Follow up required by staff Follow up required by staff               Psychosocial Discharge (Final Psychosocial Re-Evaluation):  Psychosocial Re-Evaluation - 07/10/23 1421       Psychosocial Re-Evaluation   Current issues with None Identified    Comments Roshard reportd no major stressors at this time. He enjoys walking and woodworking for stress relief. He has a good support system around him made up primarily of his wife and two daughters. Waldon reports no issues with sleep at this time.    Expected Outcomes Short: Continue to exercise for stress relief. Long: Continue to maintain positive outlook.    Interventions Encouraged to attend Cardiac Rehabilitation for the exercise    Continue Psychosocial Services  Follow up required by staff             Vocational Rehabilitation: Provide vocational rehab assistance to qualifying candidates.   Vocational Rehab Evaluation & Intervention:   Education: Education Goals: Education classes will be provided on a variety of topics geared toward better understanding of heart health and risk factor modification. Participant will state understanding/return demonstration of topics presented as noted by education test scores.  Learning Barriers/Preferences:   General Cardiac Education Topics:  AED/CPR: - Group verbal and written instruction with the use of  models to demonstrate the basic use of the AED with the basic ABC's of resuscitation.   Anatomy and Cardiac Procedures: - Group verbal and  visual presentation and models provide information about basic cardiac anatomy and function. Reviews the testing methods done to diagnose heart disease and the outcomes of the test results. Describes the treatment choices: Medical Management, Angioplasty, or Coronary Bypass Surgery for treating various heart conditions including Myocardial Infarction, Angina, Valve Disease, and Cardiac Arrhythmias.  Written material given at graduation.   Medication Safety: - Group verbal and visual instruction to review commonly prescribed medications for heart and lung disease. Reviews the medication, class of the drug, and side effects. Includes the steps to properly store meds and maintain the prescription regimen.  Written material given at graduation. Flowsheet Row Cardiac Rehab from 07/02/2023 in [facility] Cardiac and Pulmonary Rehab  Date 06/11/23  Educator SB  Instruction Review Code 1- Verbalizes Understanding       Intimacy: - Group verbal instruction through game format to discuss how heart and lung disease can affect sexual intimacy. Written material given at graduation..   Know Your Numbers and Heart Failure: - Group verbal and visual instruction to discuss disease risk factors for cardiac and pulmonary disease and treatment options.  Reviews associated critical values for Overweight/Obesity, Hypertension, Cholesterol, and Diabetes.  Discusses basics of heart failure: signs/symptoms and treatments.  Introduces Heart Failure Zone chart for action plan for heart failure.  Written material given at graduation. Flowsheet Row Cardiac Rehab from 07/02/2023 in [facility] Cardiac and Pulmonary Rehab  Education need identified 05/19/23  Date 06/18/23  Educator SB  Instruction Review Code 1- Verbalizes Understanding       Infection Prevention: - Provides verbal and written material to individual with discussion of infection control including proper hand washing and proper equipment cleaning during exercise  session. Flowsheet Row Cardiac Rehab from 07/02/2023 in [facility] Cardiac and Pulmonary Rehab  Date 05/19/23  Educator [facility]  Instruction Review Code 1- Verbalizes Understanding       Falls Prevention: - Provides verbal and written material to individual with discussion of falls prevention and safety. Flowsheet Row Cardiac Rehab from 07/02/2023 in [facility] Cardiac and Pulmonary Rehab  Date 05/19/23  Educator [facility]  Instruction Review Code 1- Verbalizes Understanding       Other: -Provides group and verbal instruction on various topics (see comments)   Knowledge Questionnaire Score:  Knowledge Questionnaire Score - 05/19/23 1533       Knowledge Questionnaire Score   Pre Score 24/26             Core Components/Risk Factors/Patient Goals at Admission:  Personal Goals and Risk Factors at Admission - 05/19/23 1532       Core Components/Risk Factors/Patient Goals on Admission    Weight Management Yes    Intervention Weight Management: Provide education and appropriate resources to help participant work on and attain dietary goals.;Weight Management: Develop a combined nutrition and exercise program designed to reach desired caloric intake, while maintaining appropriate intake of nutrient and fiber, sodium and fats, and appropriate energy expenditure required for the weight goal.    Admit Weight 148 lb 3.2 oz (67.2 kg)    Goal Weight: Short Term 148 lb (67.1 kg)    Goal Weight: Long Term 148 lb (67.1 kg)    Expected Outcomes Short Term: Continue to assess and modify interventions until short term weight is achieved;Long Term: Adherence to nutrition and physical activity/exercise program aimed toward attainment of established weight goal;Weight  Maintenance: Understanding of the daily nutrition guidelines, which includes 25-35% calories from fat, 7% or less cal from saturated fats, less than 200mg  cholesterol, less than 1.5gm of sodium, & 5 or more servings of fruits and vegetables  daily;Understanding recommendations for meals to include 15-35% energy as protein, 25-35% energy from fat, 35-60% energy from carbohydrates, less than 200mg  of dietary cholesterol, 20-35 gm of total fiber daily;Understanding of distribution of calorie intake throughout the day with the consumption of 4-5 meals/snacks    Heart Failure Yes    Intervention Provide a combined exercise and nutrition program that is supplemented with education, support and counseling about heart failure. Directed toward relieving symptoms such as shortness of breath, decreased exercise tolerance, and extremity edema.    Expected Outcomes Improve functional capacity of life;Short term: Attendance in program 2-3 days a week with increased exercise capacity. Reported lower sodium intake. Reported increased fruit and vegetable intake. Reports medication compliance.;Short term: Daily weights obtained and reported for increase. Utilizing diuretic protocols set by physician.;Long term: Adoption of self-care skills and reduction of barriers for early signs and symptoms recognition and intervention leading to self-care maintenance.    Hypertension Yes    Intervention Provide education on lifestyle modifcations including regular physical activity/exercise, weight management, moderate sodium restriction and increased consumption of fresh fruit, vegetables, and low fat dairy, alcohol moderation, and smoking cessation.;Monitor prescription use compliance.    Expected Outcomes Short Term: Continued assessment and intervention until BP is < 140/61mm HG in hypertensive participants. < 130/65mm HG in hypertensive participants with diabetes, heart failure or chronic kidney disease.;Long Term: Maintenance of blood pressure at goal levels.    Lipids Yes    Intervention Provide education and support for participant on nutrition & aerobic/resistive exercise along with prescribed medications to achieve LDL 70mg , HDL >40mg .    Expected Outcomes Short  Term: Participant states understanding of desired cholesterol values and is compliant with medications prescribed. Participant is following exercise prescription and nutrition guidelines.;Long Term: Cholesterol controlled with medications as prescribed, with individualized exercise RX and with personalized nutrition plan. Value goals: LDL < 70mg , HDL > 40 mg.             Education:Diabetes - Individual verbal and written instruction to review signs/symptoms of diabetes, desired ranges of glucose level fasting, after meals and with exercise. Acknowledge that pre and post exercise glucose checks will be done for 3 sessions at entry of program.   Core Components/Risk Factors/Patient Goals Review:   Goals and Risk Factor Review     Row Name 06/11/23 1420 07/10/23 1426           Core Components/Risk Factors/Patient Goals Review   Personal Goals Review Weight Management/Obesity Weight Management/Obesity;Hypertension      Review He lost a lot of weight after surgery, appetite and taste were gone. This has improved and he reports he has gained back ~5lbs since starting rehab. Commended him on his recovery and reminded him of caloire and nutrient dense foods to add to meals and snacks. He states that his weight has been maintained around 155-160 lb. He states that he is more interested in gaining muscle mass more than he is the number on the scale. He also reports that he checks his BP at home routinely. He also states that his BP readings have been good and within normal ranges when he checks it.      Expected Outcomes STG: increase strength, weight stabilization. LTG: maintain and achieve strong healthy weight Short: Continue to  work towards gaining muscle mass. Long: Continue to manage lifestyle risk factors.               Core Components/Risk Factors/Patient Goals at Discharge (Final Review):   Goals and Risk Factor Review - 07/10/23 1426       Core Components/Risk Factors/Patient Goals  Review   Personal Goals Review Weight Management/Obesity;Hypertension    Review He states that his weight has been maintained around 155-160 lb. He states that he is more interested in gaining muscle mass more than he is the number on the scale. He also reports that he checks his BP at home routinely. He also states that his BP readings have been good and within normal ranges when he checks it.    Expected Outcomes Short: Continue to work towards gaining muscle mass. Long: Continue to manage lifestyle risk factors.             ITP Comments:  ITP Comments     Row Name 04/21/23 1442 05/19/23 1524 05/21/23 1310 05/21/23 1505 06/18/23 1127   ITP Comments Virtual orientation call completed today. he has an appointment on Date: 04/24/2023  for EP eval and gym Orientation.  Documentation of diagnosis can be found in [facility] Date: 04/11/2023 . Completed and gym orientation. Initial ITP created and sent for review to Dr. Bethann Punches, Medical Director. 30 Day review completed. Medical Director ITP review done, changes made as directed, and signed approval by Medical Director.    new to program First full day of exercise!  Patient was oriented to gym and equipment including functions, settings, policies, and procedures.  Patient's individual exercise prescription and treatment plan were reviewed.  All starting workloads were established based on the results of the 6 minute walk test done at initial orientation visit.  The plan for exercise progression was also introduced and progression will be customized based on patient's performance and goals. 30 Day review completed. Medical Director ITP review done, changes made as directed, and signed approval by Medical Director.    new to program    Row Name 07/16/23 1027           ITP Comments 30 Day review completed. Medical Director ITP review done, changes made as directed, and signed approval by Medical Director.                Comments: 30 day  review

## 2023-07-17 ENCOUNTER — Encounter: Payer: Medicare Other | Admitting: Physical Therapy

## 2023-07-17 ENCOUNTER — Telehealth: Payer: Self-pay | Admitting: Cardiovascular Disease

## 2023-07-17 ENCOUNTER — Encounter: Payer: Medicare Other | Admitting: *Deleted

## 2023-07-17 DIAGNOSIS — I5022 Chronic systolic (congestive) heart failure: Secondary | ICD-10-CM | POA: Diagnosis not present

## 2023-07-17 DIAGNOSIS — Z5189 Encounter for other specified aftercare: Secondary | ICD-10-CM | POA: Diagnosis not present

## 2023-07-17 NOTE — Telephone Encounter (Signed)
 Wife stated she believed patient's hand-held device's battery needs to be replaced.  Wife stated when she lifts the device off of the cradle the battery light shows red but show green when in the cradle.  Wife wants advice on next steps regarding transmissions.

## 2023-07-17 NOTE — Progress Notes (Signed)
 Daily Session Note  Patient Details  Name: Thomas [name] MRN: [id number] Date of Birth: 1932-10-12 Referring Provider:   Flowsheet Row Cardiac Rehab from 05/19/2023 in [facility] Cardiac and Pulmonary Rehab  Referring Provider Dr. Lorine Bears       Encounter Date: 07/17/2023  Check In:  Session Check In - 07/17/23 1400       Check-In   Supervising physician immediately available to respond to emergencies See telemetry face sheet for immediately available ER MD    Location [facility]-Cardiac & Pulmonary Rehab    Staff Present Susann Givens RN,BSN;Joseph [facility] BS, Exercise Physiologist;Noah Tickle, BS, Exercise Physiologist    Virtual Visit No    Medication changes reported     No    Fall or balance concerns reported    No    Warm-up and Cool-down Performed on first and last piece of equipment    Resistance Training Performed Yes    VAD Patient? No    PAD/SET Patient? No      Pain Assessment   Currently in Pain? No/denies                Social History   Tobacco Use  Smoking Status Never  Smokeless Tobacco Never    Goals Met:  Independence with exercise equipment Exercise tolerated well No report of concerns or symptoms today Strength training completed today  Goals Unmet:  Not Applicable  Comments: Pt able to follow exercise prescription today without complaint.  Will continue to monitor for progression.    Dr. Bethann Punches is Medical Director for [facility] Cardiac Rehabilitation.  Dr. Vida Rigger is Medical Director for [facility] Pulmonary Rehabilitation.

## 2023-07-18 NOTE — Telephone Encounter (Signed)
 LMOVM for pt wife to give [place] a call back. I called to help her with pt monitor.

## 2023-07-19 ENCOUNTER — Other Ambulatory Visit: Payer: Self-pay | Admitting: Internal Medicine

## 2023-07-21 ENCOUNTER — Encounter: Payer: Medicare Other | Admitting: *Deleted

## 2023-07-21 DIAGNOSIS — I5022 Chronic systolic (congestive) heart failure: Secondary | ICD-10-CM | POA: Diagnosis not present

## 2023-07-21 DIAGNOSIS — Z5189 Encounter for other specified aftercare: Secondary | ICD-10-CM | POA: Diagnosis not present

## 2023-07-21 NOTE — Telephone Encounter (Signed)
 This is a Educational psychologist pt

## 2023-07-21 NOTE — Progress Notes (Signed)
 Daily Session Note  Patient Details  Name: Thomas [name] MRN: [id number] Date of Birth: 07-27-1932 Referring Provider:   Flowsheet Row Cardiac Rehab from 05/19/2023 in [facility] Cardiac and Pulmonary Rehab  Referring Provider Dr. Lorine Bears       Encounter Date: 07/21/2023  Check In:  Session Check In - 07/21/23 1335       Check-In   Supervising physician immediately available to respond to emergencies See telemetry face sheet for immediately available ER MD    Location [facility]-Cardiac & Pulmonary Rehab    Staff Present Susann Givens RN,BSN;Joseph Weyman Pedro, [place], Exercise Physiologist;Laureen Manson Passey, [place], RRT, CPFT    Virtual Visit No    Medication changes reported     No    Fall or balance concerns reported    No    Warm-up and Cool-down Performed on first and last piece of equipment    Resistance Training Performed Yes    VAD Patient? No    PAD/SET Patient? No      Pain Assessment   Currently in Pain? No/denies                Social History   Tobacco Use  Smoking Status Never  Smokeless Tobacco Never    Goals Met:  Independence with exercise equipment Exercise tolerated well No report of concerns or symptoms today Strength training completed today  Goals Unmet:  Not Applicable  Comments: Pt able to follow exercise prescription today without complaint.  Will continue to monitor for progression.    Dr. Bethann Punches is Medical Director for [facility] Cardiac Rehabilitation.  Dr. Vida Rigger is Medical Director for [facility] Pulmonary Rehabilitation.

## 2023-07-22 ENCOUNTER — Ambulatory Visit: Payer: Medicare Other | Admitting: Dermatology

## 2023-07-23 ENCOUNTER — Encounter: Payer: Medicare Other | Admitting: *Deleted

## 2023-07-23 DIAGNOSIS — I5022 Chronic systolic (congestive) heart failure: Secondary | ICD-10-CM | POA: Diagnosis not present

## 2023-07-23 DIAGNOSIS — Z5189 Encounter for other specified aftercare: Secondary | ICD-10-CM | POA: Diagnosis not present

## 2023-07-23 NOTE — Progress Notes (Signed)
 Daily Session Note  Patient Details  Name: KRYSTOFER HEVENER MRN: [id number] Date of Birth: June 04, 1932 Referring Provider:   Flowsheet Row Cardiac Rehab from 05/19/2023 in [facility] Cardiac and Pulmonary Rehab  Referring Provider Dr. Lorine Bears       Encounter Date: 07/23/2023  Check In:  Session Check In - 07/23/23 1419       Check-In   Supervising physician immediately available to respond to emergencies See telemetry face sheet for immediately available ER MD    Location [facility]-Cardiac & Pulmonary Rehab    Staff Present Susann Givens RN,BSN;Margaret Best, MS, Exercise Physiologist;Kelly Cloretta Ned, ACSM CEP, Exercise Physiologist;Noah Tickle, BS, Exercise Physiologist    Virtual Visit No    Medication changes reported     No    Fall or balance concerns reported    No    Warm-up and Cool-down Performed on first and last piece of equipment    Resistance Training Performed Yes    VAD Patient? No    PAD/SET Patient? No      Pain Assessment   Currently in Pain? No/denies                Social History   Tobacco Use  Smoking Status Never  Smokeless Tobacco Never    Goals Met:  Independence with exercise equipment Exercise tolerated well No report of concerns or symptoms today Strength training completed today  Goals Unmet:  Not Applicable  Comments: Pt able to follow exercise prescription today without complaint.  Will continue to monitor for progression.    Dr. Bethann Punches is Medical Director for [facility] Cardiac Rehabilitation.  Dr. Vida Rigger is Medical Director for [facility] Pulmonary Rehabilitation.

## 2023-07-24 ENCOUNTER — Encounter: Payer: Medicare Other | Admitting: *Deleted

## 2023-07-24 DIAGNOSIS — I5022 Chronic systolic (congestive) heart failure: Secondary | ICD-10-CM

## 2023-07-24 DIAGNOSIS — Z5189 Encounter for other specified aftercare: Secondary | ICD-10-CM | POA: Diagnosis not present

## 2023-07-24 NOTE — Progress Notes (Signed)
 Daily Session Note  Patient Details  Name: Thomas [name] MRN: [id number] Date of Birth: 11/07/1932 Referring Provider:   Flowsheet Row Cardiac Rehab from 05/19/2023 in [facility] Cardiac and Pulmonary Rehab  Referring Provider Dr. Lorine Bears       Encounter Date: 07/24/2023  Check In:  Session Check In - 07/24/23 1352       Check-In   Supervising physician immediately available to respond to emergencies See telemetry face sheet for immediately available ER MD    Location [facility]-Cardiac & Pulmonary Rehab    Staff Present Susann Givens RN,BSN;Joseph [facility] BS, Exercise Physiologist    Virtual Visit No    Medication changes reported     No    Fall or balance concerns reported    No    Warm-up and Cool-down Performed on first and last piece of equipment    Resistance Training Performed Yes    VAD Patient? No    PAD/SET Patient? No      Pain Assessment   Currently in Pain? No/denies                Social History   Tobacco Use  Smoking Status Never  Smokeless Tobacco Never    Goals Met:  Independence with exercise equipment Exercise tolerated well No report of concerns or symptoms today Strength training completed today  Goals Unmet:  Not Applicable  Comments: Pt able to follow exercise prescription today without complaint.  Will continue to monitor for progression.    Dr. Bethann Punches is Medical Director for [facility] Cardiac Rehabilitation.  Dr. Vida Rigger is Medical Director for [facility] Pulmonary Rehabilitation.

## 2023-07-25 NOTE — Telephone Encounter (Signed)
 I gave [employer] stay connected his phone number to give them a call back.

## 2023-07-30 ENCOUNTER — Encounter: Payer: Medicare Other | Admitting: *Deleted

## 2023-07-30 DIAGNOSIS — I5022 Chronic systolic (congestive) heart failure: Secondary | ICD-10-CM | POA: Diagnosis not present

## 2023-07-30 DIAGNOSIS — Z5189 Encounter for other specified aftercare: Secondary | ICD-10-CM | POA: Diagnosis not present

## 2023-07-30 NOTE — Progress Notes (Signed)
 Daily Session Note  Patient Details  Name: Thomas [name] MRN: [id number] Date of Birth: 1933/04/22 Referring Provider:   Flowsheet Row Cardiac Rehab from 05/19/2023 in [facility] Cardiac and Pulmonary Rehab  Referring Provider Dr. Lorine Bears       Encounter Date: 07/30/2023  Check In:  Session Check In - 07/30/23 1402       Check-In   Supervising physician immediately available to respond to emergencies See telemetry face sheet for immediately available ER MD    Location [facility]-Cardiac & Pulmonary Rehab    Staff Present Susann Givens RN,BSN;Kelly Madilyn Fireman BS, ACSM CEP, Exercise Physiologist;Noah Tickle, BS, Exercise Physiologist;Jason [name] Cullens RDN,LDN    Virtual Visit No    Medication changes reported     No    Fall or balance concerns reported    No    Warm-up and Cool-down Performed on first and last piece of equipment    Resistance Training Performed Yes    VAD Patient? No    PAD/SET Patient? No      Pain Assessment   Currently in Pain? No/denies                Social History   Tobacco Use  Smoking Status Never  Smokeless Tobacco Never    Goals Met:  Independence with exercise equipment Exercise tolerated well Personal goals reviewed No report of concerns or symptoms today Strength training completed today  Goals Unmet:  Not Applicable  Comments: Pt able to follow exercise prescription today without complaint.  Will continue to monitor for progression.    Dr. Bethann Punches is Medical Director for [facility] Cardiac Rehabilitation.  Dr. Vida Rigger is Medical Director for [facility] Pulmonary Rehabilitation.

## 2023-07-31 ENCOUNTER — Encounter: Payer: Medicare Other | Admitting: *Deleted

## 2023-07-31 DIAGNOSIS — I5022 Chronic systolic (congestive) heart failure: Secondary | ICD-10-CM

## 2023-07-31 DIAGNOSIS — Z5189 Encounter for other specified aftercare: Secondary | ICD-10-CM | POA: Diagnosis not present

## 2023-07-31 NOTE — Progress Notes (Signed)
 Daily Session Note  Patient Details  Name: Thomas [name] MRN: [id number] Date of Birth: 1932/11/29 Referring Provider:   Flowsheet Row Cardiac Rehab from 05/19/2023 in [facility] Cardiac and Pulmonary Rehab  Referring Provider Dr. Lorine Bears       Encounter Date: 07/31/2023  Check In:  Session Check In - 07/31/23 1351       Check-In   Supervising physician immediately available to respond to emergencies See telemetry face sheet for immediately available ER MD    Location [facility]-Cardiac & Pulmonary Rehab    Staff Present Susann Givens RN,BSN;Joseph [facility] BS, Exercise Physiologist;Noah Tickle, BS, Exercise Physiologist    Virtual Visit No    Medication changes reported     No    Fall or balance concerns reported    No    Warm-up and Cool-down Performed on first and last piece of equipment    Resistance Training Performed Yes    VAD Patient? No    PAD/SET Patient? No      Pain Assessment   Currently in Pain? No/denies                Social History   Tobacco Use  Smoking Status Never  Smokeless Tobacco Never    Goals Met:  Independence with exercise equipment Exercise tolerated well No report of concerns or symptoms today Strength training completed today  Goals Unmet:  Not Applicable  Comments: Pt able to follow exercise prescription today without complaint.  Will continue to monitor for progression.    Dr. Bethann Punches is Medical Director for [facility] Cardiac Rehabilitation.  Dr. Vida Rigger is Medical Director for [facility] Pulmonary Rehabilitation.

## 2023-08-04 ENCOUNTER — Encounter: Payer: Medicare Other | Admitting: *Deleted

## 2023-08-04 DIAGNOSIS — I5022 Chronic systolic (congestive) heart failure: Secondary | ICD-10-CM

## 2023-08-04 DIAGNOSIS — Z5189 Encounter for other specified aftercare: Secondary | ICD-10-CM | POA: Diagnosis not present

## 2023-08-04 NOTE — Progress Notes (Signed)
 Daily Session Note  Patient Details  Name: Thomas [name] MRN: [id number] Date of Birth: Sep 27, 1932 Referring Provider:   Flowsheet Row Cardiac Rehab from 05/19/2023 in [facility] Cardiac and Pulmonary Rehab  Referring Provider Dr. Lorine Bears       Encounter Date: 08/04/2023  Check In:  Session Check In - 08/04/23 1350       Check-In   Supervising physician immediately available to respond to emergencies See telemetry face sheet for immediately available ER MD    Location [facility]-Cardiac & Pulmonary Rehab    Staff Present Susann Givens RN,BSN;Joseph [facility] BS, Exercise Physiologist;Noah Tickle, BS, Exercise Physiologist    Virtual Visit No    Medication changes reported     No    Fall or balance concerns reported    No    Warm-up and Cool-down Performed on first and last piece of equipment    Resistance Training Performed Yes    VAD Patient? No    PAD/SET Patient? No      Pain Assessment   Currently in Pain? No/denies                Social History   Tobacco Use  Smoking Status Never  Smokeless Tobacco Never    Goals Met:  Independence with exercise equipment Exercise tolerated well No report of concerns or symptoms today Strength training completed today  Goals Unmet:  Not Applicable  Comments: Pt able to follow exercise prescription today without complaint.  Will continue to monitor for progression.    Dr. Bethann Punches is Medical Director for [facility] Cardiac Rehabilitation.  Dr. Vida Rigger is Medical Director for [facility] Pulmonary Rehabilitation.

## 2023-08-05 ENCOUNTER — Ambulatory Visit (INDEPENDENT_AMBULATORY_CARE_PROVIDER_SITE_OTHER)

## 2023-08-05 DIAGNOSIS — I495 Sick sinus syndrome: Secondary | ICD-10-CM | POA: Diagnosis not present

## 2023-08-06 LAB — CUP PACEART REMOTE DEVICE CHECK
Battery Remaining Longevity: 115 mo
Battery Voltage: 3.02 V
Brady Statistic AP VP Percent: 94.4 %
Brady Statistic AP VS Percent: 0.01 %
Brady Statistic AS VP Percent: 5.62 %
Brady Statistic AS VS Percent: 0 %
Brady Statistic RA Percent Paced: 93.71 %
Brady Statistic RV Percent Paced: 99.98 %
Date Time Interrogation Session: 20250401185025
Implantable Lead Connection Status: 753985
Implantable Lead Connection Status: 753985
Implantable Lead Connection Status: 753985
Implantable Lead Implant Date: 20180214
Implantable Lead Implant Date: 20180214
Implantable Lead Implant Date: 20240906
Implantable Lead Location: 753859
Implantable Lead Location: 753860
Implantable Lead Location: 753860
Implantable Lead Model: 3830
Implantable Lead Model: 5076
Implantable Lead Model: 5076
Implantable Pulse Generator Implant Date: 20240906
Lead Channel Impedance Value: 266 Ohm
Lead Channel Impedance Value: 285 Ohm
Lead Channel Impedance Value: 361 Ohm
Lead Channel Impedance Value: 380 Ohm
Lead Channel Impedance Value: 380 Ohm
Lead Channel Impedance Value: 380 Ohm
Lead Channel Impedance Value: 437 Ohm
Lead Channel Impedance Value: 494 Ohm
Lead Channel Impedance Value: 551 Ohm
Lead Channel Pacing Threshold Amplitude: 0.5 V
Lead Channel Pacing Threshold Amplitude: 0.875 V
Lead Channel Pacing Threshold Amplitude: 0.875 V
Lead Channel Pacing Threshold Pulse Width: 0.4 ms
Lead Channel Pacing Threshold Pulse Width: 0.4 ms
Lead Channel Pacing Threshold Pulse Width: 0.4 ms
Lead Channel Sensing Intrinsic Amplitude: 20.875 mV
Lead Channel Sensing Intrinsic Amplitude: 20.875 mV
Lead Channel Sensing Intrinsic Amplitude: 4.125 mV
Lead Channel Sensing Intrinsic Amplitude: 4.125 mV
Lead Channel Setting Pacing Amplitude: 1.5 V
Lead Channel Setting Pacing Amplitude: 1.5 V
Lead Channel Setting Pacing Amplitude: 2 V
Lead Channel Setting Pacing Pulse Width: 0.4 ms
Lead Channel Setting Pacing Pulse Width: 0.4 ms
Lead Channel Setting Sensing Sensitivity: 4 mV
Zone Setting Status: 755011

## 2023-08-07 ENCOUNTER — Encounter: Payer: Medicare Other | Attending: Cardiovascular Disease

## 2023-08-07 VITALS — Ht 65.5 in | Wt 157.2 lb

## 2023-08-07 DIAGNOSIS — I5022 Chronic systolic (congestive) heart failure: Secondary | ICD-10-CM | POA: Insufficient documentation

## 2023-08-07 NOTE — Patient Instructions (Signed)
 Discharge Patient Instructions  Patient Details  Name: Thomas [name] MRN: [id number] Date of Birth: 10-17-1932 Referring Provider:  No ref. provider found   Number of Visits: 36  Reason for Discharge:  Patient reached a stable level of exercise. Patient independent in their exercise. Patient has met program and personal goals.  Diagnosis:  Heart failure, chronic systolic (HCC)  Initial Exercise Prescription:  Initial Exercise Prescription - 05/19/23 1500       Date of Initial Exercise RX and Referring Provider   Date 05/19/23    Referring Provider Dr. Lorine Bears      Oxygen   Maintain Oxygen Saturation 88% or higher      Treadmill   MPH 1.8    Grade 1    Minutes 15    METs 2.63      Recumbant Bike   Level 3    RPM 50    Watts 20    Minutes 15    METs 2.34      NuStep   Level 3    SPM 80    Minutes 15    METs 2.34      REL-XR   Level 3    Speed 50    Minutes 15    METs 2.34      Biostep-RELP   Level 3    SPM 50    Minutes 15    METs 2.34      Prescription Details   Frequency (times per week) 3    Duration Progress to 30 minutes of continuous aerobic without signs/symptoms of physical distress      Intensity   THRR 40-80% of Max Heartrate 103-122    Ratings of Perceived Exertion 11-13    Perceived Dyspnea 0-4      Progression   Progression Continue to progress workloads to maintain intensity without signs/symptoms of physical distress.      Resistance Training   Training Prescription Yes    Weight 7    Reps 10-15             Discharge Exercise Prescription (Final Exercise Prescription Changes):  Exercise Prescription Changes - 07/28/23 1400       Response to Exercise   Blood Pressure (Admit) 122/64    Blood Pressure (Exit) 124/66    Heart Rate (Admit) 66 bpm    Heart Rate (Exercise) 120 bpm    Heart Rate (Exit) 68 bpm    Rating of Perceived Exertion (Exercise) 13    Symptoms none    Duration Continue with 30 min of  aerobic exercise without signs/symptoms of physical distress.    Intensity THRR unchanged      Progression   Progression Continue to progress workloads to maintain intensity without signs/symptoms of physical distress.    Average METs 3.62      Resistance Training   Training Prescription Yes    Weight 8    Reps 10-15      Interval Training   Interval Training No      Treadmill   MPH 3.2    Grade 0.5    Minutes 15    METs 3.67      REL-XR   Level 8    Minutes 15    METs 5.4      T5 Nustep   Level 4    Minutes 15    METs 2.5      Home Exercise Plan   Plans to continue exercise at [facility]  Pt plans to join [facility] for aerobic equipment, Walking dog around 1 mile a day   Frequency Add 2 additional days to program exercise sessions.    Initial Home Exercises Provided 06/30/23      Oxygen   Maintain Oxygen Saturation 88% or higher             Functional Capacity:  6 Minute Walk     Row Name 05/19/23 1525 08/07/23 1401       6 Minute Walk   Phase Initial Discharge    Distance 1360 feet 1390 feet    Distance % Change -- 2.2 %    Distance Feet Change -- 30 ft    Walk Time 6 minutes 6 minutes    # of Rest Breaks 0 0    MPH 2.58 2.63    METS 2.34 2.16    RPE 11 11    Perceived Dyspnea  0 0    VO2 Peak 8.21 7.57    Symptoms No No    Resting HR 84 bpm 63 bpm    Resting BP 118/78 128/66    Resting Oxygen Saturation  98 % 94 %    Exercise Oxygen Saturation  during 6 min walk 92 % 89 %    Max Ex. HR 117 bpm 97 bpm    Max Ex. BP 132/70 138/72    2 Minute Post BP 120/80 --            Nutrition & Weight - Outcomes:  Pre Biometrics - 05/19/23 1531       Pre Biometrics   Height 5' 5.5" (1.664 m)    Weight 148 lb 3.2 oz (67.2 kg)    Waist Circumference 35 inches    Hip Circumference 38 inches    Waist to Hip Ratio 0.92 %    BMI (Calculated) 24.28    Single Leg Stand 2.47 seconds             Post Biometrics - 08/07/23 1402         Post  Biometrics   Height 5' 5.5" (1.664 m)    Weight 157 lb 3.2 oz (71.3 kg)    Waist Circumference 33 inches    Hip Circumference 36 inches    Waist to Hip Ratio 0.92 %    BMI (Calculated) 25.75    Single Leg Stand 6.52 seconds             Nutrition:  Nutrition Therapy & Goals - 05/19/23 1621       Nutrition Therapy   Diet Cardiac, low Na    Protein (specify units) 60-75g    Fiber 30 grams    Whole Grain Foods 3 servings    Saturated Fats 15 max. grams    Fruits and Vegetables 5 servings/day    Sodium 2 grams      Personal Nutrition Goals   Nutrition Goal Drink 64oz of water daily    Personal Goal #2 Eat 15-20gProtein and 30-60gCarbs at each meal.    Personal Goal #3 Read labels and reduce sodium intake to below 2300mg . Ideally 1500mg  per day.    Comments Patient recovering from hospital stay and reports appetite has improved a lot since his inpatient stay, says it could still use more improvement. Spoke today about ways to meet nutritional needs with less quantity of foods, since her reports some nausea and early satiety. Encouraged him to eat a quality protein ~15-20g per meal paired with a complex carb. Reviewed some  good pairings and answered questions around protein, types of fats, sodium, and sugar. Reviewed a few facts labels and provided advice on how to read labels and determine if the foods are valuable for his nutritional goals. Encouraged efficient foods like beans, lentils, and hummus. Healthy fat rich foods like peanuts, peanut butter, fish, and olive oil to increase calories with less quantity of food. Wanted to set goal of ~64oz of water daily, reminded him to ensure water intake doesn't further reduce food intake, suggested he sip on water between meal and keep water at meal to what is needed to enjoy the meal and ensure safe swallowing when appetite is poor. He is optimistic that his appetite will improve and he will be able to eat more normally. Will continue to  monitor and support      Intervention Plan   Intervention Prescribe, educate and counsel regarding individualized specific dietary modifications aiming towards targeted core components such as weight, hypertension, lipid management, diabetes, heart failure and other comorbidities.;Nutrition handout(s) given to patient.    Expected Outcomes Short Term Goal: Understand basic principles of dietary content, such as calories, fat, sodium, cholesterol and nutrients.;Short Term Goal: A plan has been developed with personal nutrition goals set during dietitian appointment.;Long Term Goal: Adherence to prescribed nutrition plan.

## 2023-08-07 NOTE — Progress Notes (Signed)
 Daily Session Note  Patient Details  Name: Thomas [name] MRN: [id number] Date of Birth: [birth year] Referring Provider:   Flowsheet Row Cardiac Rehab from 05/19/2023 in [facility] Cardiac and Pulmonary Rehab  Referring Provider Dr. Lorine Bears       Encounter Date: 08/07/2023  Check In:  Session Check In - 08/07/23 1339       Check-In   Supervising physician immediately available to respond to emergencies See telemetry face sheet for immediately available ER MD    Location [facility]-Cardiac & Pulmonary Rehab    Staff Present Susann Givens RN,BSN;Joseph [facility] BS, Exercise Physiologist;Noah Tickle, BS, Exercise Physiologist    Virtual Visit No    Medication changes reported     No    Fall or balance concerns reported    No    Warm-up and Cool-down Performed on first and last piece of equipment    Resistance Training Performed Yes    VAD Patient? No    PAD/SET Patient? No      Pain Assessment   Currently in Pain? No/denies                Social History   Tobacco Use  Smoking Status Never  Smokeless Tobacco Never    Goals Met:  Independence with exercise equipment Exercise tolerated well No report of concerns or symptoms today Strength training completed today  Goals Unmet:  Not Applicable  Comments: Pt able to follow exercise prescription today without complaint.  Will continue to monitor for progression.    Dr. Bethann Punches is Medical Director for [facility] Cardiac Rehabilitation.  Dr. Vida Rigger is Medical Director for [facility] Pulmonary Rehabilitation.

## 2023-08-08 DIAGNOSIS — H2513 Age-related nuclear cataract, bilateral: Secondary | ICD-10-CM | POA: Diagnosis not present

## 2023-08-08 DIAGNOSIS — H43813 Vitreous degeneration, bilateral: Secondary | ICD-10-CM | POA: Diagnosis not present

## 2023-08-08 DIAGNOSIS — H353131 Nonexudative age-related macular degeneration, bilateral, early dry stage: Secondary | ICD-10-CM | POA: Diagnosis not present

## 2023-08-11 ENCOUNTER — Encounter: Payer: Medicare Other | Admitting: *Deleted

## 2023-08-11 DIAGNOSIS — I5022 Chronic systolic (congestive) heart failure: Secondary | ICD-10-CM | POA: Diagnosis not present

## 2023-08-11 DIAGNOSIS — G4733 Obstructive sleep apnea (adult) (pediatric): Secondary | ICD-10-CM | POA: Diagnosis not present

## 2023-08-11 NOTE — Progress Notes (Signed)
 Daily Session Note  Patient Details  Name: Thomas [name] MRN: [id number] Date of Birth: Feb 17, 1933 Referring Provider:   Flowsheet Row Cardiac Rehab from 05/19/2023 in [facility] Cardiac and Pulmonary Rehab  Referring Provider Dr. Lorine Bears       Encounter Date: 08/11/2023  Check In:  Session Check In - 08/11/23 1348       Check-In   Supervising physician immediately available to respond to emergencies See telemetry face sheet for immediately available ER MD    Location [facility]-Cardiac & Pulmonary Rehab    Staff Present Susann Givens RN,BSN;Susanne Bice, RN, BSN, CCRP;Maxon Conetta BS, Exercise Physiologist;Noah Tickle, BS, Exercise Physiologist    Virtual Visit No    Medication changes reported     No    Fall or balance concerns reported    No    Warm-up and Cool-down Performed on first and last piece of equipment    Resistance Training Performed Yes    VAD Patient? No    PAD/SET Patient? No      Pain Assessment   Currently in Pain? No/denies                Social History   Tobacco Use  Smoking Status Never  Smokeless Tobacco Never    Goals Met:  Independence with exercise equipment Exercise tolerated well No report of concerns or symptoms today Strength training completed today  Goals Unmet:  Not Applicable  Comments: Pt able to follow exercise prescription today without complaint.  Will continue to monitor for progression.    Dr. Bethann Punches is Medical Director for [facility] Cardiac Rehabilitation.  Dr. Vida Rigger is Medical Director for [facility] Pulmonary Rehabilitation.

## 2023-08-13 ENCOUNTER — Encounter: Payer: Self-pay | Admitting: *Deleted

## 2023-08-13 DIAGNOSIS — I5022 Chronic systolic (congestive) heart failure: Secondary | ICD-10-CM

## 2023-08-13 DIAGNOSIS — Z981 Arthrodesis status: Secondary | ICD-10-CM | POA: Diagnosis not present

## 2023-08-13 DIAGNOSIS — S32029G Unspecified fracture of second lumbar vertebra, subsequent encounter for fracture with delayed healing: Secondary | ICD-10-CM | POA: Diagnosis not present

## 2023-08-13 NOTE — Progress Notes (Signed)
 Cardiac Individual Treatment Plan  Patient Details  Name: Thomas [name] MRN: [id number] Date of Birth: 09/09/1932 Referring Provider:   Flowsheet Row Cardiac Rehab from 05/19/2023 in [facility] Cardiac and Pulmonary Rehab  Referring Provider Dr. Lorine Bears       Initial Encounter Date:  Flowsheet Row Cardiac Rehab from 05/19/2023 in [facility] Cardiac and Pulmonary Rehab  Date 05/19/23       Visit Diagnosis: Heart failure, chronic systolic (HCC)  Patient's Home Medications on Admission:  Current Outpatient Medications:    acetaminophen (TYLENOL) 500 MG tablet, Take 500-1,000 mg by mouth every 6 (six) hours as needed for mild pain (pain score 1-3) or fever., Disp: , Rfl:    apixaban (ELIQUIS) 2.5 MG TABS tablet, Take 1 tablet (2.5 mg total) by mouth 2 (two) times daily., Disp: 90 tablet, Rfl: 3   Artificial Tears ophthalmic solution, Place 1 drop into both eyes in the morning, at noon, in the evening, and at bedtime., Disp: , Rfl:    atorvastatin (LIPITOR) 40 MG tablet, Take 1 tablet (40 mg total) by mouth daily., Disp: 90 tablet, Rfl: 3   buPROPion (WELLBUTRIN XL) 150 MG 24 hr tablet, Take 150 mg by mouth daily., Disp: , Rfl:    calcium carbonate (TUMS - DOSED IN MG ELEMENTAL CALCIUM) 500 MG chewable tablet, Chew 1-2 tablets by mouth daily as needed for indigestion., Disp: , Rfl:    Coenzyme Q10 (COQ10) 100 MG CAPS, Take 100 mg by mouth daily., Disp: , Rfl:    colchicine 0.6 MG tablet, Take 0.6 mg by mouth daily as needed (for gout flares)., Disp: , Rfl:    docusate sodium (COLACE) 100 MG capsule, Take 100 mg by mouth daily as needed for mild constipation., Disp: , Rfl:    dofetilide (TIKOSYN) 125 MCG capsule, TAKE 1 CAPSULE BY MOUTH TWICE DAILY, Disp: 180 capsule, Rfl: 2   DULoxetine (CYMBALTA) 20 MG capsule, Take 20 mg by mouth 2 (two) times daily., Disp: , Rfl:    febuxostat (ULORIC) 40 MG tablet, Take 1 tablet (40 mg total) by mouth daily., Disp: 90 tablet, Rfl: 3   gabapentin  (NEURONTIN) 300 MG capsule, Take 300 mg by mouth at bedtime., Disp: , Rfl:    hydrALAZINE (APRESOLINE) 25 MG tablet, Take 1 tablet (25 mg total) by mouth as needed (for a Systolic reading of 160 that will not decrease after 30 minutes rest.)., Disp: 30 tablet, Rfl: 0   methocarbamol (ROBAXIN) 500 MG tablet, Take 500 mg by mouth every 6 (six) hours as needed for muscle spasms., Disp: , Rfl:    metoprolol succinate (TOPROL-XL) 50 MG 24 hr tablet, TAKE 1 TABLET BY MOUTH DAILY WITH OR IMMEDIATELY FOLLOWING A MEAL (Patient taking differently: Take 50 mg by mouth at bedtime.), Disp: 90 tablet, Rfl: 3   metroNIDAZOLE (FLAGYL) 500 MG tablet, TAKE 1 TABLET BY MOUTH TWICE DAILY ON SUNDAY AND MONDAY. (Patient taking differently: Take 1,000 mg by mouth See admin instructions. Take 1,000 mg by mouth on Saturday and Sunday- EVERY OTHER WEEKEND), Disp: 90 tablet, Rfl: 3   NEEDLE, DISP, 18 G (BD DISP NEEDLES) 18G X 1-1/2" MISC, 1 mg by Does not apply route every 14 (fourteen) days., Disp: 50 each, Rfl: 0   NEEDLE, DISP, 21 G (BD DISP NEEDLES) 21G X 1-1/2" MISC, 1 mg by Does not apply route every 14 (fourteen) days., Disp: 50 each, Rfl: 0   omeprazole (PRILOSEC) 20 MG capsule, Take 20 mg by mouth daily as needed (for  heartburn- 30 minutes before a meal)., Disp: , Rfl:    sacubitril-valsartan (ENTRESTO) 24-26 MG, Take 1 tablet by mouth 2 (two) times daily., Disp: 180 tablet, Rfl: 3   Syringe, Disposable, (2-3CC SYRINGE) 3 ML MISC, 1 mg by Does not apply route every 14 (fourteen) days., Disp: 25 each, Rfl: 3   terbinafine (LAMISIL) 250 MG tablet, Take 1 tablet (250 mg total) by mouth daily., Disp: 30 tablet, Rfl: 1   Testosterone 1.62 % GEL, APPLY 2 PUMPS DAILY (Patient taking differently: Apply 2 Pump topically daily.), Disp: 75 g, Rfl: 5   testosterone cypionate (DEPOTESTOSTERONE CYPIONATE) 200 MG/ML injection, Inject 1 mL (200 mg total) into the muscle every 28 (twenty-eight) days., Disp: 10 mL, Rfl: 0  Past Medical  History: Past Medical History:  Diagnosis Date   Aortic valve disorders    Arthritis    Atrial flutter (HCC) 06/18/2016   "AF or AFl; not sure which" (06/23/2016)   Basal cell carcinoma    "face, nose left shoulder, left arm" (06/19/2016)   Basal cell carcinoma 09/13/2020   right temple   BBB (bundle branch block)    hx right   Chronic back pain    "neck, thoracic, lower back" (06/19/2016)   Complete heart block (HCC) 06/2016   Dyspnea    GERD (gastroesophageal reflux disease)    Gout    Heart block    "I've had type I, II Wenke before now" (06/19/2016)   History of gout    History of hiatal hernia    "self dx'd" (06/19/2016)   Hyperlipidemia    Hypertension    Lyme disease    "dx'd by me 2003; cx's showed dx 08/2015"   Migraine    "3-4/year" (06/19/2016)   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   PVC's (premature ventricular contractions)    Renal cancer, left (HCC) 2006   S/P cryotherapy   Spinal stenosis    "cervical, 1 thoracic, lumbar" (06/19/2016)   Squamous carcinoma    "face, nose left shoulder, left arm" (06/19/2016)   Stroke (HCC)    TIA (transient ischemic attack) 06/14/2016   "I'm not sure that's what it was" (06/25/2016)   Visit for monitoring Tikosyn therapy 09/09/2017    Tobacco Use: Social History   Tobacco Use  Smoking Status Never  Smokeless Tobacco Never    Labs: Review Flowsheet  More data exists      Latest Ref Rng & Units 02/07/2020 11/01/2020 08/21/2021 01/09/2022 11/25/2022  Labs for ITP Cardiac and Pulmonary Rehab  Cholestrol 100 - 199 mg/dL 956  387  - 564  -  LDL (calc) 0 - 99 mg/dL 76  70  - 56  -  HDL-C >39 mg/dL 53  41  - 67  -  Trlycerides 0 - 149 mg/dL 47  69  - 39  -  Hemoglobin A1c 4.8 - 5.6 % - - 5.7  5.7  -  PH, Arterial 7.35 - 7.45 - - - - 7.376   PCO2 arterial 32 - 48 mmHg - - - - 41.7   Bicarbonate 20.0 - 28.0 mmol/L - - - - 25.5  24.4   TCO2 22 - 32 mmol/L - - - - 27  26    Acid-base deficit 0.0 - 2.0 mmol/L - - - - 1.0   O2 Saturation % - - - - 70  88     Details       Multiple values  from one day are sorted in reverse-chronological order          Exercise Target Goals: Exercise Program Goal: Individual exercise prescription set using results from initial 6 min walk test and THRR while considering  patient's activity barriers and safety.   Exercise Prescription Goal: Initial exercise prescription builds to 30-45 minutes a day of aerobic activity, 2-3 days per week.  Home exercise guidelines will be given to patient during program as part of exercise prescription that the participant will acknowledge.   Education: Aerobic Exercise: - Group verbal and visual presentation on the components of exercise prescription. Introduces F.I.T.T principle from ACSM for exercise prescriptions.  Reviews F.I.T.T. principles of aerobic exercise including progression. Written material given at graduation.   Education: Resistance Exercise: - Group verbal and visual presentation on the components of exercise prescription. Introduces F.I.T.T principle from ACSM for exercise prescriptions  Reviews F.I.T.T. principles of resistance exercise including progression. Written material given at graduation. Flowsheet Row Cardiac Rehab from 07/30/2023 in [facility] Cardiac and Pulmonary Rehab  Date 07/16/23  Educator [facility]  Instruction Review Code 1- [employer] Understanding        Education: Exercise & Equipment Safety: - Individual verbal instruction and demonstration of equipment use and safety with use of the equipment. Flowsheet Row Cardiac Rehab from 07/30/2023 in [facility] Cardiac and Pulmonary Rehab  Date 05/19/23  Educator [facility]  Instruction Review Code 1- Verbalizes Understanding       Education: Exercise Physiology & General Exercise Guidelines: - Group verbal and written instruction with models to review the exercise physiology of the cardiovascular system and associated  critical values. Provides general exercise guidelines with specific guidelines to those with heart or lung disease.    Education: Flexibility, Balance, Mind/Body Relaxation: - Group verbal and visual presentation with interactive activity on the components of exercise prescription. Introduces F.I.T.T principle from ACSM for exercise prescriptions. Reviews F.I.T.T. principles of flexibility and balance exercise training including progression. Also discusses the mind body connection.  Reviews various relaxation techniques to help reduce and manage stress (i.e. Deep breathing, progressive muscle relaxation, and visualization). Balance handout provided to take home. Written material given at graduation. Flowsheet Row Cardiac Rehab from 07/30/2023 in [facility] Cardiac and Pulmonary Rehab  Date 07/16/23  Educator [facility]  Instruction Review Code 1- Verbalizes Understanding       Activity Barriers & Risk Stratification:  Activity Barriers & Cardiac Risk Stratification - 05/19/23 1526       Activity Barriers & Cardiac Risk Stratification   Activity Barriers Back Problems;Muscular Weakness    Cardiac Risk Stratification High             6 Minute Walk:  6 Minute Walk     Row Name 05/19/23 1525 08/07/23 1401       6 Minute Walk   Phase Initial Discharge    Distance 1360 feet 1390 feet    Distance % Change -- 2.2 %    Distance Feet Change -- 30 ft    Walk Time 6 minutes 6 minutes    # of Rest Breaks 0 0    MPH 2.58 2.63    METS 2.34 2.16    RPE 11 11    Perceived Dyspnea  0 0    VO2 Peak 8.21 7.57    Symptoms No No    Resting HR 84 bpm 63 bpm    Resting BP 118/78 128/66    Resting Oxygen Saturation  98 % 94 %    Exercise Oxygen  Saturation  during 6 min walk 92 % 89 %    Max Ex. HR 117 bpm 97 bpm    Max Ex. BP 132/70 138/72    2 Minute Post BP 120/80 --             Oxygen Initial Assessment:   Oxygen Re-Evaluation:   Oxygen Discharge (Final Oxygen  Re-Evaluation):   Initial Exercise Prescription:  Initial Exercise Prescription - 05/19/23 1500       Date of Initial Exercise RX and Referring Provider   Date 05/19/23    Referring Provider Dr. Lorine Bears      Oxygen   Maintain Oxygen Saturation 88% or higher      Treadmill   MPH 1.8    Grade 1    Minutes 15    METs 2.63      Recumbant Bike   Level 3    RPM 50    Watts 20    Minutes 15    METs 2.34      NuStep   Level 3    SPM 80    Minutes 15    METs 2.34      REL-XR   Level 3    Speed 50    Minutes 15    METs 2.34      Biostep-RELP   Level 3    SPM 50    Minutes 15    METs 2.34      Prescription Details   Frequency (times per week) 3    Duration Progress to 30 minutes of continuous aerobic without signs/symptoms of physical distress      Intensity   THRR 40-80% of Max Heartrate 103-122    Ratings of Perceived Exertion 11-13    Perceived Dyspnea 0-4      Progression   Progression Continue to progress workloads to maintain intensity without signs/symptoms of physical distress.      Resistance Training   Training Prescription Yes    Weight 7    Reps 10-15             Perform Capillary Blood Glucose checks as needed.  Exercise Prescription Changes:   Exercise Prescription Changes     Row Name 05/19/23 1500 06/02/23 1500 06/17/23 1500 06/30/23 1400 07/02/23 0800     Response to Exercise   Blood Pressure (Admit) 118/78 106/60 136/66 -- 152/80   Blood Pressure (Exercise) 132/70 122/56 155/66 -- 152/74   Blood Pressure (Exit) 120/80 126/60 126/64 -- 130/66   Heart Rate (Admit) 84 bpm 89 bpm 82 bpm -- 50 bpm   Heart Rate (Exercise) 117 bpm 117 bpm 123 bpm -- 128 bpm   Heart Rate (Exit) 66 bpm 67 bpm 70 bpm -- 64 bpm   Oxygen Saturation (Admit) 98 % -- -- -- --   Oxygen Saturation (Exercise) 92 % -- -- -- --   Oxygen Saturation (Exit) 99 % -- -- -- --   Rating of Perceived Exertion (Exercise) 11 13 13  -- 13   Perceived Dyspnea  (Exercise) 0 -- 0 -- 0   Symptoms none none none -- none   Comments 6 MWT results first 2 exercise sessions -- -- --   Duration -- Progress to 30 minutes of  aerobic without signs/symptoms of physical distress Progress to 30 minutes of  aerobic without signs/symptoms of physical distress -- Progress to 30 minutes of  aerobic without signs/symptoms of physical distress   Intensity -- THRR unchanged THRR unchanged -- THRR unchanged  Progression   Progression -- Continue to progress workloads to maintain intensity without signs/symptoms of physical distress. Continue to progress workloads to maintain intensity without signs/symptoms of physical distress. -- Continue to progress workloads to maintain intensity without signs/symptoms of physical distress.   Average METs -- 2.38 2.8 -- 3.45     Resistance Training   Training Prescription -- Yes Yes -- Yes   Weight -- 7 7 -- 7   Reps -- 10-15 10-15 -- 10-15     Interval Training   Interval Training -- -- No -- No     Oxygen   Oxygen -- Continuous -- -- --     Treadmill   MPH -- 2.2 2.2 -- 3   Grade -- 1 0 -- 0.5   Minutes -- 15 15 -- 15   METs -- 2.99 -- -- 3.5     NuStep   Level -- 3 -- -- --   SPM -- 80 -- -- --   Minutes -- 15 -- -- --   METs -- 2 -- -- --     REL-XR   Level -- -- 3 -- 4   Minutes -- -- 15 -- 15   METs -- -- 3.2 -- 3.9     T5 Nustep   Level -- 1 -- -- --   SPM -- 80 -- -- --   Minutes -- 15 -- -- --   METs -- 2 -- -- --     Home Exercise Plan   Plans to continue exercise at -- -- -- [facility]  Pt plans to join [place] for aerobic equipment, Walking dog around 1 mile a day [employer]  Pt plans to join [employer] for aerobic equipment, Walking dog around 1 mile a day   Frequency -- -- -- Add 2 additional days to program exercise sessions. Add 2 additional days to program exercise sessions.   Initial Home Exercises Provided -- -- -- 06/30/23 06/30/23     Oxygen   Maintain Oxygen  Saturation -- 88% or higher 88% or higher 88% or higher 88% or higher    Row Name 07/17/23 1700 07/28/23 1400           Response to Exercise   Blood Pressure (Admit) 120/78 122/64      Blood Pressure (Exercise) 134/64 --      Blood Pressure (Exit) 110/70 124/66      Heart Rate (Admit) 59 bpm 66 bpm      Heart Rate (Exercise) 121 bpm 120 bpm      Heart Rate (Exit) 73 bpm 68 bpm      Rating of Perceived Exertion (Exercise) 13 13      Symptoms none none      Duration Continue with 30 min of aerobic exercise without signs/symptoms of physical distress. Continue with 30 min of aerobic exercise without signs/symptoms of physical distress.      Intensity THRR unchanged THRR unchanged        Progression   Progression Continue to progress workloads to maintain intensity without signs/symptoms of physical distress. Continue to progress workloads to maintain intensity without signs/symptoms of physical distress.      Average METs 3.28 3.62        Resistance Training   Training Prescription Yes Yes      Weight 7 8      Reps 10-15 10-15        Interval Training   Interval Training No No  Treadmill   MPH 3.2 3.2      Grade 0.5 0.5      Minutes 15 15      METs 3.67 3.67        NuStep   Level 3 --      Minutes 15 --      METs 2.6 --        REL-XR   Level 4 8      Minutes 15 15      METs 4.1 5.4        T5 Nustep   Level 4 4      Minutes 15 15      METs 2.8 2.5        Home Exercise Plan   Plans to continue exercise at [facility]  Pt plans to join [place] for aerobic equipment, Walking dog around 1 mile a day [employer]  Pt plans to join [place] for aerobic equipment, Walking dog around 1 mile a day      Frequency Add 2 additional days to program exercise sessions. Add 2 additional days to program exercise sessions.      Initial Home Exercises Provided 06/30/23 06/30/23        Oxygen   Maintain Oxygen Saturation -- 88% or higher                Exercise Comments:   Exercise Comments     Row Name 05/21/23 1505           Exercise Comments First full day of exercise!  Patient was oriented to gym and equipment including functions, settings, policies, and procedures.  Patient's individual exercise prescription and treatment plan were reviewed.  All starting workloads were established based on the results of the 6 minute walk test done at initial orientation visit.  The plan for exercise progression was also introduced and progression will be customized based on patient's performance and goals.                Exercise Goals and Review:   Exercise Goals     Row Name 05/19/23 1530             Exercise Goals   Increase Physical Activity Yes       Intervention Provide advice, education, support and counseling about physical activity/exercise needs.;Develop an individualized exercise prescription for aerobic and resistive training based on initial evaluation findings, risk stratification, comorbidities and participant's personal goals.       Expected Outcomes Short Term: Attend rehab on a regular basis to increase amount of physical activity.;Long Term: Add in home exercise to make exercise part of routine and to increase amount of physical activity.;Long Term: Exercising regularly at least 3-5 days a week.       Increase Strength and Stamina Yes       Intervention Provide advice, education, support and counseling about physical activity/exercise needs.;Develop an individualized exercise prescription for aerobic and resistive training based on initial evaluation findings, risk stratification, comorbidities and participant's personal goals.       Expected Outcomes Short Term: Increase workloads from initial exercise prescription for resistance, speed, and METs.;Short Term: Perform resistance training exercises routinely during rehab and add in resistance training at home;Long Term: Improve cardiorespiratory fitness, muscular  endurance and strength as measured by increased METs and functional capacity ( )       Able to understand and use rate of perceived exertion (RPE) scale Yes       Intervention  Provide education and explanation on how to use RPE scale       Expected Outcomes Short Term: Able to use RPE daily in rehab to express subjective intensity level;Long Term:  Able to use RPE to guide intensity level when exercising independently       Able to understand and use Dyspnea scale Yes       Intervention Provide education and explanation on how to use Dyspnea scale       Expected Outcomes Short Term: Able to use Dyspnea scale daily in rehab to express subjective sense of shortness of breath during exertion;Long Term: Able to use Dyspnea scale to guide intensity level when exercising independently       Knowledge and understanding of Target Heart Rate Range (THRR) Yes       Intervention Provide education and explanation of THRR including how the numbers were predicted and where they are located for reference       Expected Outcomes Short Term: Able to state/look up THRR;Long Term: Able to use THRR to govern intensity when exercising independently;Short Term: Able to use daily as guideline for intensity in rehab       Able to check pulse independently Yes       Intervention Provide education and demonstration on how to check pulse in carotid and radial arteries.;Review the importance of being able to check your own pulse for safety during independent exercise       Expected Outcomes Short Term: Able to explain why pulse checking is important during independent exercise;Long Term: Able to check pulse independently and accurately       Understanding of Exercise Prescription Yes       Intervention Provide education, explanation, and written materials on patient's individual exercise prescription       Expected Outcomes Short Term: Able to explain program exercise prescription;Long Term: Able to explain home exercise  prescription to exercise independently                Exercise Goals Re-Evaluation :  Exercise Goals Re-Evaluation     Row Name 05/21/23 1508 06/02/23 1558 06/11/23 1406 06/17/23 1531 06/30/23 1429     Exercise Goal Re-Evaluation   Exercise Goals Review Increase Physical Activity;Able to understand and use rate of perceived exertion (RPE) scale;Knowledge and understanding of Target Heart Rate Range (THRR);Understanding of Exercise Prescription;Increase Strength and Stamina;Able to check pulse independently Increase Physical Activity;Understanding of Exercise Prescription;Increase Strength and Stamina Increase Physical Activity;Increase Strength and Stamina;Understanding of Exercise Prescription Increase Physical Activity;Increase Strength and Stamina;Understanding of Exercise Prescription Increase Physical Activity;Able to understand and use Dyspnea scale;Understanding of Exercise Prescription;Knowledge and understanding of Target Heart Rate Range (THRR);Increase Strength and Stamina;Able to check pulse independently;Able to understand and use rate of perceived exertion (RPE) scale   Comments Reviewed RPE and dyspnea scale, THR and program prescription with pt today.  Pt voiced understanding and was given a copy of goals to take home. Nyeem is off to a good start in the program. He has completed 2 exercise sessions. He did well with his exercise prescription and even increased his treadmill workload to a speed of 2. and incline of 1%. He also added T5 nustep to his exercise prescription. We will continue to monitor his progress in the program. Esa is doing well at rehab, feels he is out of shape but is determined to get back to where he was before the surgeries. Currently on treadmill at 1.8 speed. He is happy with the ability to workout  safely while being monitored. Encouraged him to continue to workout here at rehab and doing exercise safely when at home. Homero is doing well in rehab. He was able  to maintain his speed on the treadmill but decreased his incline from 1% to 0. He was also able to maintain a level of 3 on the XR. We will continue to encourage and monitor his progression in the program. Reviewed home exercise with pt today.  Pt plans to join the [facility] for the aerobic equipment, and also will continue to walk his dog about 1 mile a day for exercise.  Reviewed THR, pulse, RPE, sign and symptoms, pulse oximetery and when to call 911 or MD.  Also discussed weather considerations and indoor options.  Pt voiced understanding.   Expected Outcomes Short: Use RPE daily to regulate intensity.  Long: Follow program prescription in THR. Short: Continue to follow current exercise prescription. Long: Continue exercise to improve strength and stamina. STG: continue to come to rehab and increase workload as able. LTG: Continue to improve strenght and stamina Short: Continue to follow exercise prescription and increase workloads when able. Long: Continue exercise to improve strength and stamina. Short: Join [facility] for aerobic equipment and exercise. Long: Continue exercise to improve strength and stamina.    Row Name 07/02/23 1610 07/17/23 1742 07/23/23 1414 07/28/23 1447       Exercise Goal Re-Evaluation   Exercise Goals Review Increase Physical Activity;Increase Strength and Stamina;Understanding of Exercise Prescription Increase Physical Activity;Increase Strength and Stamina;Understanding of Exercise Prescription Increase Physical Activity;Increase Strength and Stamina;Understanding of Exercise Prescription Increase Physical Activity;Increase Strength and Stamina;Understanding of Exercise Prescription    Comments Vicki is doing well in rehab. He was able to increase his treadmill workload to a speed of and an incline of 0.5%. He was also able to increase from level 3 to 4 on the XR. We will continue to monitor his progress in the program. Regnald is doing well in rehab. He was able to increase  his treadmill workload to a speed of 3.2 mph and maintain an incline of 0.5%. He was also able to maintain level 4 on the XR and increase to level 4 on the T5 nustep. We will continue to monitor his progress in the program. Noam is walking and doing weight exercises at home fairly consistently. He is consistently getting 3-4 days a week of exercise in each week, including his days in rehab. He was encouraged to work towards 4 days a week  of exercise consistently. Exercise progression and interval training was discussed with patient. He may start incorporating interval training as he feels ready to progress to higher intensities. THRR was reviewed and patient was encouraged to look into buying a pulse ox to better monitor his heart rate a home while exercising. Damier continues to do well in rehab. He increased to level 8 on the XR and level 4 on the T5 nustep. He has maintained his workload on the treadmill at a speed of 3.2 mph and incline of 0.5%. He also increased his handweights to 8 lbs for resistance. We will continue to monitor his progress in the program.    Expected Outcomes Short: Continue to increase treadmill workload. Long: Continue exercise to improve strength and stamina. Short: Continue to progressively increase workload on the XR and treadmill. Long: Continue exercise to improve strength and stamina. Short: try interval training in cardiac rehab and purchase a pulse ox to help monitor heart rate during exercise at home. Long:  join [facility] after graduation and continue with independent exercise routien. Short: Continue to progressively increase treadmill workload. Long: Continue exercise to improve strength and stamina.             Discharge Exercise Prescription (Final Exercise Prescription Changes):  Exercise Prescription Changes - 07/28/23 1400       Response to Exercise   Blood Pressure (Admit) 122/64    Blood Pressure (Exit) 124/66    Heart Rate (Admit) 66 bpm    Heart Rate  (Exercise) 120 bpm    Heart Rate (Exit) 68 bpm    Rating of Perceived Exertion (Exercise) 13    Symptoms none    Duration Continue with 30 min of aerobic exercise without signs/symptoms of physical distress.    Intensity THRR unchanged      Progression   Progression Continue to progress workloads to maintain intensity without signs/symptoms of physical distress.    Average METs 3.62      Resistance Training   Training Prescription Yes    Weight 8    Reps 10-15      Interval Training   Interval Training No      Treadmill   MPH 3.2    Grade 0.5    Minutes 15    METs 3.67      REL-XR   Level 8    Minutes 15    METs 5.4      T5 Nustep   Level 4    Minutes 15    METs 2.5      Home Exercise Plan   Plans to continue exercise at [facility]   Pt plans to join [facility] for aerobic equipment, Walking dog around 1 mile a day   Frequency Add 2 additional days to program exercise sessions.    Initial Home Exercises Provided 06/30/23      Oxygen   Maintain Oxygen Saturation 88% or higher             Nutrition:  Target Goals: Understanding of nutrition guidelines, daily intake of sodium 1500mg , cholesterol 200mg , calories 30% from fat and 7% or less from saturated fats, daily to have 5 or more servings of fruits and vegetables.  Education: All About Nutrition: -Group instruction provided by verbal, written material, interactive activities, discussions, models, and posters to present general guidelines for heart healthy nutrition including fat, fiber, MyPlate, the role of sodium in heart healthy nutrition, utilization of the nutrition label, and utilization of this knowledge for meal planning. Follow up email sent as well. Written material given at graduation. Flowsheet Row Cardiac Rehab from 07/30/2023 in [facility] Cardiac and Pulmonary Rehab  Date 07/30/23  Educator JG  Instruction Review Code 1- Verbalizes Understanding       Biometrics:  Pre Biometrics -  05/19/23 1531       Pre Biometrics   Height 5' 5.5" (1.664 m)    Weight 148 lb 3.2 oz (67.2 kg)    Waist Circumference 35 inches    Hip Circumference 38 inches    Waist to Hip Ratio 0.92 %    BMI (Calculated) 24.28    Single Leg Stand 2.47 seconds             Post Biometrics - 08/07/23 1402        Post  Biometrics   Height 5' 5.5" (1.664 m)    Weight 157 lb 3.2 oz (71.3 kg)    Waist Circumference 33 inches    Hip Circumference 36 inches  Waist to Hip Ratio 0.92 %    BMI (Calculated) 25.75    Single Leg Stand 6.52 seconds             Nutrition Therapy Plan and Nutrition Goals:  Nutrition Therapy & Goals - 05/19/23 1621       Nutrition Therapy   Diet Cardiac, low Na    Protein (specify units) 60-75g    Fiber 30 grams    Whole Grain Foods 3 servings    Saturated Fats 15 max. grams    Fruits and Vegetables 5 servings/day    Sodium 2 grams      Personal Nutrition Goals   Nutrition Goal Drink 64oz of water daily    Personal Goal #2 Eat 15-20gProtein and 30-60gCarbs at each meal.    Personal Goal #3 Read labels and reduce sodium intake to below 2300mg . Ideally 1500mg  per day.    Comments Patient recovering from hospital stay and reports appetite has improved a lot since his inpatient stay, says it could still use more improvement. Spoke today about ways to meet nutritional needs with less quantity of foods, since her reports some nausea and early satiety. Encouraged him to eat a quality protein ~15-20g per meal paired with a complex carb. Reviewed some good pairings and answered questions around protein, types of fats, sodium, and sugar. Reviewed a few facts labels and provided advice on how to read labels and determine if the foods are valuable for his nutritional goals. Encouraged efficient foods like beans, lentils, and hummus. Healthy fat rich foods like peanuts, peanut butter, fish, and olive oil to increase calories with less quantity of food. Wanted to set goal  of ~64oz of water daily, reminded him to ensure water intake doesn't further reduce food intake, suggested he sip on water between meal and keep water at meal to what is needed to enjoy the meal and ensure safe swallowing when appetite is poor. He is optimistic that his appetite will improve and he will be able to eat more normally. Will continue to monitor and support      Intervention Plan   Intervention Prescribe, educate and counsel regarding individualized specific dietary modifications aiming towards targeted core components such as weight, hypertension, lipid management, diabetes, heart failure and other comorbidities.;Nutrition handout(s) given to patient.    Expected Outcomes Short Term Goal: Understand basic principles of dietary content, such as calories, fat, sodium, cholesterol and nutrients.;Short Term Goal: A plan has been developed with personal nutrition goals set during dietitian appointment.;Long Term Goal: Adherence to prescribed nutrition plan.             Nutrition Assessments:  MEDIFICTS Score Key: >=70 Need to make dietary changes  40-70 Heart Healthy Diet <= 40 Therapeutic Level Cholesterol Diet  Flowsheet Row Cardiac Rehab from 05/19/2023 in [facility] Cardiac and Pulmonary Rehab  Picture Your Plate Total Score on Admission 75      Picture Your Plate Scores: <96 Unhealthy dietary pattern with much room for improvement. 41-50 Dietary pattern unlikely to meet recommendations for good health and room for improvement. 51-60 More healthful dietary pattern, with some room for improvement.  >60 Healthy dietary pattern, although there may be some specific behaviors that could be improved.    Nutrition Goals Re-Evaluation:  Nutrition Goals Re-Evaluation     Row Name 06/11/23 1417 07/10/23 1423 07/30/23 1354         Goals   Nutrition Goal -- -- Drink 64oz of water daily     Comment  Franklin reports his appetite is improving and he is tolerating food better. Remembers  discussed with RD about meeting protein goals and limiting sodium and saturated fat. Reviewed some good snack ideas with healthy fats, complex carbs and protein rich foods. Quantay states that he is doing well with his diet. He has been drinking plenty of water every day. He is still working on getting plenty of protein in his diet. He is also trying to limit sugars and sodium in his diet. He feels he is overall doing well with the dietary guidelines he discussed with the RD. Sidharth states that he is making an effort to drink more water each day. He feels comfortable reading food labels and does not add salt at the table. He has been watching his sodium intake for many years and reports that he feels he is doing well with this. He is trying to incorporate more protein into his diet and includes meat, beans, gin pro protein powder.     Expected Outcome STG: continue to eat heart healthy and focus on recovering appetite. LTG: maintain heart healthy lifestyle Short: Continue to work on getting protein and limiting sodium. Long: Continue to practice heart healthy eating patterns discussed with RD. Short: continue to read food lables and make sure he is getting enough protein each day and limiting sodium. Long: continue to follow heart healthy diet.       Personal Goal #2 Re-Evaluation   Personal Goal #2 -- -- Eat 15-20gProtein and 30-60gCarbs at each meal.       Personal Goal #3 Re-Evaluation   Personal Goal #3 -- -- Read labels and reduce sodium intake to below 2300mg . Ideally 1500mg  per day.              Nutrition Goals Discharge (Final Nutrition Goals Re-Evaluation):  Nutrition Goals Re-Evaluation - 07/30/23 1354       Goals   Nutrition Goal Drink 64oz of water daily    Comment Veron states that he is making an effort to drink more water each day. He feels comfortable reading food labels and does not add salt at the table. He has been watching his sodium intake for many years and reports that he feels  he is doing well with this. He is trying to incorporate more protein into his diet and includes meat, beans, gin pro protein powder.    Expected Outcome Short: continue to read food lables and make sure he is getting enough protein each day and limiting sodium. Long: continue to follow heart healthy diet.      Personal Goal #2 Re-Evaluation   Personal Goal #2 Eat 15-20gProtein and 30-60gCarbs at each meal.      Personal Goal #3 Re-Evaluation   Personal Goal #3 Read labels and reduce sodium intake to below 2300mg . Ideally 1500mg  per day.             Psychosocial: Target Goals: Acknowledge presence or absence of significant depression and/or stress, maximize coping skills, provide positive support system. Participant is able to verbalize types and ability to use techniques and skills needed for reducing stress and depression.   Education: Stress, Anxiety, and Depression - Group verbal and visual presentation to define topics covered.  Reviews how body is impacted by stress, anxiety, and depression.  Also discusses healthy ways to reduce stress and to treat/manage anxiety and depression.  Written material given at graduation. Flowsheet Row Cardiac Rehab from 07/30/2023 in [facility] Cardiac and Pulmonary Rehab  Date 07/02/23  Educator [facility]  Instruction Review Code 1- Verbalizes Understanding       Education: Sleep Hygiene -Provides group verbal and written instruction about how sleep can affect your health.  Define sleep hygiene, discuss sleep cycles and impact of sleep habits. Review good sleep hygiene tips.    Initial Review & Psychosocial Screening:  Initial Psych Review & Screening - 04/21/23 1417       Initial Review   Current issues with Current Psychotropic Meds      Family Dynamics   Concerns Recent loss of significant other    Comments loss of 4 significant family members in the last year      Barriers   Psychosocial barriers to participate in program There are no  identifiable barriers or psychosocial needs.;The patient should benefit from training in stress management and relaxation.      Screening Interventions   Interventions Encouraged to exercise;To provide support and resources with identified psychosocial needs;Provide feedback about the scores to participant    Expected Outcomes Short Term goal: Utilizing psychosocial counselor, staff and physician to assist with identification of specific Stressors or current issues interfering with healing process. Setting desired goal for each stressor or current issue identified.;Long Term Goal: Stressors or current issues are controlled or eliminated.;Short Term goal: Identification and review with participant of any Quality of Life or Depression concerns found by scoring the questionnaire.;Long Term goal: The participant improves quality of Life and PHQ9 Scores as seen by post scores and/or verbalization of changes             Quality of Life Scores:   Quality of Life - 05/19/23 1533       Quality of Life   Select Quality of Life      Quality of Life Scores   Health/Function Pre 24.43 %    Socioeconomic Pre 29.58 %    Psych/Spiritual Pre 24 %    Family Pre 30 %    GLOBAL Pre 26.12 %            Scores of 19 and below usually indicate a poorer quality of life in these areas.  A difference of  2-3 points is a clinically meaningful difference.  A difference of 2-3 points in the total score of the Quality of Life Index has been associated with significant improvement in overall quality of life, self-image, physical symptoms, and general health in studies assessing change in quality of life.  PHQ-9: Review Flowsheet  More data exists      05/19/2023 01/09/2022 09/26/2021 08/21/2021 10/17/2020  Depression screen PHQ 2/9  Decreased Interest 1 0 0 0 0  Down, Depressed, Hopeless 0 0 0 0 0  PHQ - 2 Score 1 0 0 0 0  Altered sleeping 1 0 0 1 0  Tired, decreased energy 1 1 0 2 1  Change in appetite 1 0 0  0 1  Feeling bad or failure about yourself  0 0 0 0 0  Trouble concentrating 0 0 0 0 0  Moving slowly or fidgety/restless 0 0 0 0 0  Suicidal thoughts 0 0 0 0 0  PHQ-9 Score 4 1 0 3 2  Difficult doing work/chores Somewhat difficult Not difficult at all Not difficult at all Not difficult at all Not difficult at all   Interpretation of Total Score  Total Score Depression Severity:  1-4 = Minimal depression, 5-9 = Mild depression, 10-14 = Moderate depression, 15-19 = Moderately severe depression, 20-27 = Severe depression   Psychosocial Evaluation and  Intervention:  Psychosocial Evaluation - 04/21/23 1442       Psychosocial Evaluation & Interventions   Interventions Encouraged to exercise with the program and follow exercise prescription    Comments Foch has no barriers to attending the program. He is ready to start and get back to his exercise routine. He lives with his wife and he has her as his support.   He has loss 4 siginificant members of his family in the past year.   He does take wellbutrin and cymbalta,[facility] for "[facility]", the meds are helping as he goes through with all his loss this year.    Expected Outcomes STG attends all scheduled sessions, continues with his meds as prescribed to help  deal with his life changes.  LTG Able to continue to manage with his meds as prescribed    Continue Psychosocial Services  Follow up required by staff             Psychosocial Re-Evaluation:  Psychosocial Re-Evaluation     Row Name 06/11/23 1415 07/10/23 1421 07/30/23 1409         Psychosocial Re-Evaluation   Current issues with None Identified None Identified None Identified     Comments Nevaeh reports a good mindset and is not exeperiencing any stress, anxiety or depression. He says he has a good support system with family. Says he is sleeping well, wakes up well rested. Jovani reportd no major stressors at this time. He enjoys walking and woodworking for stress  relief. He has a good support system around him made up primarily of his wife and two daughters. Treyson reports no issues with sleep at this time. Omid reports that he has no concerns with his stress levels or mental health at this time. He states that he has a stable support system. His only concerns is his sleep patterns. He has inconsistent sleep patterns and has been working with his doctor on this. He is in the process of getting set up with a CPAP. He does complain of having brain fog during the day but not really feeling tired. He will continue to work with his doctor to get set up with his CPAP to see if this helps his quality of sleep and day time brain fog.     Expected Outcomes STG: continue to use support system and attend cardiac rehab and education classes. LTG: maintain positive outlook on health and lifestyle changes Short: Continue to exercise for stress relief. Long: Continue to maintain positive outlook. Short: get set up with CPAP machine. Long: get into healhty sleep patterns and maintian this long term.     Interventions Encouraged to attend Cardiac Rehabilitation for the exercise Encouraged to attend Cardiac Rehabilitation for the exercise Encouraged to attend Cardiac Rehabilitation for the exercise     Continue Psychosocial Services  Follow up required by staff Follow up required by staff Follow up required by staff              Psychosocial Discharge (Final Psychosocial Re-Evaluation):  Psychosocial Re-Evaluation - 07/30/23 1409       Psychosocial Re-Evaluation   Current issues with None Identified    Comments Salvadore reports that he has no concerns with his stress levels or mental health at this time. He states that he has a stable support system. His only concerns is his sleep patterns. He has inconsistent sleep patterns and has been working with his doctor on this. He is in the process of getting  set up with a CPAP. He does complain of having brain fog during the day but not  really feeling tired. He will continue to work with his doctor to get set up with his CPAP to see if this helps his quality of sleep and day time brain fog.    Expected Outcomes Short: get set up with CPAP machine. Long: get into healhty sleep patterns and maintian this long term.    Interventions Encouraged to attend Cardiac Rehabilitation for the exercise    Continue Psychosocial Services  Follow up required by staff             Vocational Rehabilitation: Provide vocational rehab assistance to qualifying candidates.   Vocational Rehab Evaluation & Intervention:   Education: Education Goals: Education classes will be provided on a variety of topics geared toward better understanding of heart health and risk factor modification. Participant will state understanding/return demonstration of topics presented as noted by education test scores.  Learning Barriers/Preferences:   General Cardiac Education Topics:  AED/CPR: - Group verbal and written instruction with the use of models to demonstrate the basic use of the AED with the basic ABC's of resuscitation.   Anatomy and Cardiac Procedures: - Group verbal and visual presentation and models provide information about basic cardiac anatomy and function. Reviews the testing methods done to diagnose heart disease and the outcomes of the test results. Describes the treatment choices: Medical Management, Angioplasty, or Coronary Bypass Surgery for treating various heart conditions including Myocardial Infarction, Angina, Valve Disease, and Cardiac Arrhythmias.  Written material given at graduation.   Medication Safety: - Group verbal and visual instruction to review commonly prescribed medications for heart and lung disease. Reviews the medication, class of the drug, and side effects. Includes the steps to properly store meds and maintain the prescription regimen.  Written material given at graduation. Flowsheet Row Cardiac Rehab from  07/30/2023 in [facility] Cardiac and Pulmonary Rehab  Date 06/11/23  Educator SB  Instruction Review Code 1- Verbalizes Understanding       Intimacy: - Group verbal instruction through game format to discuss how heart and lung disease can affect sexual intimacy. Written material given at graduation..   Know Your Numbers and Heart Failure: - Group verbal and visual instruction to discuss disease risk factors for cardiac and pulmonary disease and treatment options.  Reviews associated critical values for Overweight/Obesity, Hypertension, Cholesterol, and Diabetes.  Discusses basics of heart failure: signs/symptoms and treatments.  Introduces Heart Failure Zone chart for action plan for heart failure.  Written material given at graduation. Flowsheet Row Cardiac Rehab from 07/30/2023 in [facility] Cardiac and Pulmonary Rehab  Education need identified 05/19/23  Date 06/18/23  Educator SB  Instruction Review Code 1- Verbalizes Understanding       Infection Prevention: - Provides verbal and written material to individual with discussion of infection control including proper hand washing and proper equipment cleaning during exercise session. Flowsheet Row Cardiac Rehab from 07/30/2023 in [facility] Cardiac and Pulmonary Rehab  Date 05/19/23  Educator [facility]  Instruction Review Code 1- Verbalizes Understanding       Falls Prevention: - Provides verbal and written material to individual with discussion of falls prevention and safety. Flowsheet Row Cardiac Rehab from 07/30/2023 in [facility] Cardiac and Pulmonary Rehab  Date 05/19/23  Educator [facility]  Instruction Review Code 1- Verbalizes Understanding       Other: -Provides group and verbal instruction on various topics (see comments)   Knowledge Questionnaire Score:  Knowledge Questionnaire Score -  05/19/23 1533       Knowledge Questionnaire Score   Pre Score 24/26             Core Components/Risk Factors/Patient Goals at Admission:  Personal Goals  and Risk Factors at Admission - 05/19/23 1532       Core Components/Risk Factors/Patient Goals on Admission    Weight Management Yes    Intervention Weight Management: Provide education and appropriate resources to help participant work on and attain dietary goals.;Weight Management: Develop a combined nutrition and exercise program designed to reach desired caloric intake, while maintaining appropriate intake of nutrient and fiber, sodium and fats, and appropriate energy expenditure required for the weight goal.    Admit Weight 148 lb 3.2 oz (67.2 kg)    Goal Weight: Short Term 148 lb (67.1 kg)    Goal Weight: Long Term 148 lb (67.1 kg)    Expected Outcomes Short Term: Continue to assess and modify interventions until short term weight is achieved;Long Term: Adherence to nutrition and physical activity/exercise program aimed toward attainment of established weight goal;Weight Maintenance: Understanding of the daily nutrition guidelines, which includes 25-35% calories from fat, 7% or less cal from saturated fats, less than 200mg  cholesterol, less than 1.5gm of sodium, & 5 or more servings of fruits and vegetables daily;Understanding recommendations for meals to include 15-35% energy as protein, 25-35% energy from fat, 35-60% energy from carbohydrates, less than 200mg  of dietary cholesterol, 20-35 gm of total fiber daily;Understanding of distribution of calorie intake throughout the day with the consumption of 4-5 meals/snacks    Heart Failure Yes    Intervention Provide a combined exercise and nutrition program that is supplemented with education, support and counseling about heart failure. Directed toward relieving symptoms such as shortness of breath, decreased exercise tolerance, and extremity edema.    Expected Outcomes Improve functional capacity of life;Short term: Attendance in program 2-3 days a week with increased exercise capacity. Reported lower sodium intake. Reported increased fruit and  vegetable intake. Reports medication compliance.;Short term: Daily weights obtained and reported for increase. Utilizing diuretic protocols set by physician.;Long term: Adoption of self-care skills and reduction of barriers for early signs and symptoms recognition and intervention leading to self-care maintenance.    Hypertension Yes    Intervention Provide education on lifestyle modifcations including regular physical activity/exercise, weight management, moderate sodium restriction and increased consumption of fresh fruit, vegetables, and low fat dairy, alcohol moderation, and smoking cessation.;Monitor prescription use compliance.    Expected Outcomes Short Term: Continued assessment and intervention until BP is < 140/40mm HG in hypertensive participants. < 130/11mm HG in hypertensive participants with diabetes, heart failure or chronic kidney disease.;Long Term: Maintenance of blood pressure at goal levels.    Lipids Yes    Intervention Provide education and support for participant on nutrition & aerobic/resistive exercise along with prescribed medications to achieve LDL 70mg , HDL >40mg .    Expected Outcomes Short Term: Participant states understanding of desired cholesterol values and is compliant with medications prescribed. Participant is following exercise prescription and nutrition guidelines.;Long Term: Cholesterol controlled with medications as prescribed, with individualized exercise RX and with personalized nutrition plan. Value goals: LDL < 70mg , HDL > 40 mg.             Education:Diabetes - Individual verbal and written instruction to review signs/symptoms of diabetes, desired ranges of glucose level fasting, after meals and with exercise. Acknowledge that pre and post exercise glucose checks will be done for 3 sessions at entry of  program.   Core Components/Risk Factors/Patient Goals Review:   Goals and Risk Factor Review     Row Name 06/11/23 1420 07/10/23 1426 07/30/23 1359          Core Components/Risk Factors/Patient Goals Review   Personal Goals Review Weight Management/Obesity Weight Management/Obesity;Hypertension Heart Failure;Hypertension;Lipids     Review He lost a lot of weight after surgery, appetite and taste were gone. This has improved and he reports he has gained back ~5lbs since starting rehab. Commended him on his recovery and reminded him of caloire and nutrient dense foods to add to meals and snacks. He states that his weight has been maintained around 155-160 lb. He states that he is more interested in gaining muscle mass more than he is the number on the scale. He also reports that he checks his BP at home routinely. He also states that his BP readings have been good and within normal ranges when he checks it. My states that he continues to try to gain muscle mass and maintain weight. He takes all his cholesterol meds and follows up with his doctor with lab work to manage this. He checks his weight and blood pressure at home. He states he is aware of the signs of heart failure and what to look out for. His BP and weight have been steady.     Expected Outcomes STG: increase strength, weight stabilization. LTG: maintain and achieve strong healthy weight Short: Continue to work towards gaining muscle mass. Long: Continue to manage lifestyle risk factors. Short: cotninue to work towards building muscle mass and strength. continue to monitor weight and BP at home. Long: continue to control cardiac risk ractors.              Core Components/Risk Factors/Patient Goals at Discharge (Final Review):   Goals and Risk Factor Review - 07/30/23 1359       Core Components/Risk Factors/Patient Goals Review   Personal Goals Review Heart Failure;Hypertension;Lipids    Review Davari states that he continues to try to gain muscle mass and maintain weight. He takes all his cholesterol meds and follows up with his doctor with lab work to manage this. He checks his weight  and blood pressure at home. He states he is aware of the signs of heart failure and what to look out for. His BP and weight have been steady.    Expected Outcomes Short: cotninue to work towards building muscle mass and strength. continue to monitor weight and BP at home. Long: continue to control cardiac risk ractors.             ITP Comments:  ITP Comments     Row Name 04/21/23 1442 05/19/23 1524 05/21/23 1310 05/21/23 1505 06/18/23 1127   ITP Comments Virtual orientation call completed today. he has an appointment on Date: 04/24/2023  for EP eval and gym Orientation.  Documentation of diagnosis can be found in [facility] Date: 04/11/2023 . Completed and gym orientation. Initial ITP created and sent for review to Dr. Bethann Punches, Medical Director. 30 Day review completed. Medical Director ITP review done, changes made as directed, and signed approval by Medical Director.    new to program First full day of exercise!  Patient was oriented to gym and equipment including functions, settings, policies, and procedures.  Patient's individual exercise prescription and treatment plan were reviewed.  All starting workloads were established based on the results of the 6 minute walk test done at initial orientation visit.  The plan  for exercise progression was also introduced and progression will be customized based on patient's performance and goals. 30 Day review completed. Medical Director ITP review done, changes made as directed, and signed approval by Medical Director.    new to program    Row Name 07/16/23 1027 08/13/23 0821         ITP Comments 30 Day review completed. Medical Director ITP review done, changes made as directed, and signed approval by Medical Director. 30 Day review completed. Medical Director ITP review done, changes made as directed, and signed approval by Medical Director.               Comments:

## 2023-08-14 ENCOUNTER — Encounter: Payer: Medicare Other | Admitting: Physical Therapy

## 2023-08-16 ENCOUNTER — Encounter: Payer: Self-pay | Admitting: Internal Medicine

## 2023-08-18 ENCOUNTER — Ambulatory Visit

## 2023-08-18 DIAGNOSIS — Z950 Presence of cardiac pacemaker: Secondary | ICD-10-CM | POA: Diagnosis not present

## 2023-08-18 DIAGNOSIS — Z8673 Personal history of transient ischemic attack (TIA), and cerebral infarction without residual deficits: Secondary | ICD-10-CM | POA: Diagnosis not present

## 2023-08-18 DIAGNOSIS — I10 Essential (primary) hypertension: Secondary | ICD-10-CM | POA: Diagnosis not present

## 2023-08-18 DIAGNOSIS — R0781 Pleurodynia: Secondary | ICD-10-CM | POA: Diagnosis not present

## 2023-08-18 DIAGNOSIS — S2242XA Multiple fractures of ribs, left side, initial encounter for closed fracture: Secondary | ICD-10-CM | POA: Diagnosis not present

## 2023-08-18 DIAGNOSIS — I252 Old myocardial infarction: Secondary | ICD-10-CM | POA: Diagnosis not present

## 2023-08-20 ENCOUNTER — Ambulatory Visit

## 2023-08-20 DIAGNOSIS — T50904A Poisoning by unspecified drugs, medicaments and biological substances, undetermined, initial encounter: Secondary | ICD-10-CM | POA: Diagnosis not present

## 2023-08-20 DIAGNOSIS — T428X5A Adverse effect of antiparkinsonism drugs and other central muscle-tone depressants, initial encounter: Secondary | ICD-10-CM | POA: Diagnosis not present

## 2023-08-20 DIAGNOSIS — R270 Ataxia, unspecified: Secondary | ICD-10-CM | POA: Diagnosis not present

## 2023-08-20 DIAGNOSIS — I771 Stricture of artery: Secondary | ICD-10-CM | POA: Diagnosis not present

## 2023-08-20 DIAGNOSIS — Z8673 Personal history of transient ischemic attack (TIA), and cerebral infarction without residual deficits: Secondary | ICD-10-CM | POA: Diagnosis not present

## 2023-08-20 DIAGNOSIS — Z950 Presence of cardiac pacemaker: Secondary | ICD-10-CM | POA: Diagnosis not present

## 2023-08-20 DIAGNOSIS — I10 Essential (primary) hypertension: Secondary | ICD-10-CM | POA: Diagnosis not present

## 2023-08-20 DIAGNOSIS — S0990XA Unspecified injury of head, initial encounter: Secondary | ICD-10-CM | POA: Diagnosis not present

## 2023-08-20 DIAGNOSIS — I252 Old myocardial infarction: Secondary | ICD-10-CM | POA: Diagnosis not present

## 2023-08-20 DIAGNOSIS — R404 Transient alteration of awareness: Secondary | ICD-10-CM | POA: Diagnosis not present

## 2023-08-20 DIAGNOSIS — T50995A Adverse effect of other drugs, medicaments and biological substances, initial encounter: Secondary | ICD-10-CM | POA: Diagnosis not present

## 2023-08-20 DIAGNOSIS — R410 Disorientation, unspecified: Secondary | ICD-10-CM | POA: Diagnosis not present

## 2023-08-25 ENCOUNTER — Ambulatory Visit

## 2023-08-27 ENCOUNTER — Ambulatory Visit

## 2023-08-28 ENCOUNTER — Ambulatory Visit

## 2023-09-01 ENCOUNTER — Ambulatory Visit

## 2023-09-03 ENCOUNTER — Ambulatory Visit

## 2023-09-04 ENCOUNTER — Ambulatory Visit

## 2023-09-08 ENCOUNTER — Ambulatory Visit

## 2023-09-10 ENCOUNTER — Ambulatory Visit

## 2023-09-10 ENCOUNTER — Encounter: Payer: Self-pay | Admitting: *Deleted

## 2023-09-10 DIAGNOSIS — I5022 Chronic systolic (congestive) heart failure: Secondary | ICD-10-CM

## 2023-09-10 NOTE — Progress Notes (Signed)
 Cardiac Individual Treatment Plan  Patient Details  Name: Thomas [name] MRN: [id number] Date of Birth: 07/04/1932 Referring Provider:   Flowsheet Row Cardiac Rehab from 05/19/2023 in [facility] Cardiac and Pulmonary Rehab  Referring Provider Dr. Antionette Kirks       Initial Encounter Date:  Flowsheet Row Cardiac Rehab from 05/19/2023 in [facility] Cardiac and Pulmonary Rehab  Date 05/19/23       Visit Diagnosis: Heart failure, chronic systolic (HCC)  Patient's Home Medications on Admission:  Current Outpatient Medications:    acetaminophen  (TYLENOL ) 500 MG tablet, Take 500-1,000 mg by mouth every 6 (six) hours as needed for mild pain (pain score 1-3) or fever., Disp: , Rfl:    apixaban  (ELIQUIS ) 2.5 MG TABS tablet, Take 1 tablet (2.5 mg total) by mouth 2 (two) times daily., Disp: 90 tablet, Rfl: 3   Artificial Tears ophthalmic solution, Place 1 drop into both eyes in the morning, at noon, in the evening, and at bedtime., Disp: , Rfl:    atorvastatin  (LIPITOR) 40 MG tablet, Take 1 tablet (40 mg total) by mouth daily., Disp: 90 tablet, Rfl: 3   buPROPion  (WELLBUTRIN  XL) 150 MG 24 hr tablet, Take 150 mg by mouth daily., Disp: , Rfl:    calcium  carbonate (TUMS - DOSED IN MG ELEMENTAL CALCIUM ) 500 MG chewable tablet, Chew 1-2 tablets by mouth daily as needed for indigestion., Disp: , Rfl:    Coenzyme Q10 (COQ10) 100 MG CAPS, Take 100 mg by mouth daily., Disp: , Rfl:    colchicine  0.6 MG tablet, Take 0.6 mg by mouth daily as needed (for gout flares)., Disp: , Rfl:    docusate sodium  (COLACE) 100 MG capsule, Take 100 mg by mouth daily as needed for mild constipation., Disp: , Rfl:    dofetilide  (TIKOSYN ) 125 MCG capsule, TAKE 1 CAPSULE BY MOUTH TWICE DAILY, Disp: 180 capsule, Rfl: 2   DULoxetine  (CYMBALTA ) 20 MG capsule, Take 20 mg by mouth 2 (two) times daily., Disp: , Rfl:    febuxostat  (ULORIC ) 40 MG tablet, Take 1 tablet (40 mg total) by mouth daily., Disp: 90 tablet, Rfl: 3   gabapentin   (NEURONTIN ) 300 MG capsule, Take 300 mg by mouth at bedtime., Disp: , Rfl:    hydrALAZINE  (APRESOLINE ) 25 MG tablet, Take 1 tablet (25 mg total) by mouth as needed (for a Systolic reading of 160 that will not decrease after 30 minutes rest.)., Disp: 30 tablet, Rfl: 0   methocarbamol  (ROBAXIN ) 500 MG tablet, Take 500 mg by mouth every 6 (six) hours as needed for muscle spasms., Disp: , Rfl:    metoprolol  succinate (TOPROL -XL) 50 MG 24 hr tablet, TAKE 1 TABLET BY MOUTH DAILY WITH OR IMMEDIATELY FOLLOWING A MEAL (Patient taking differently: Take 50 mg by mouth at bedtime.), Disp: 90 tablet, Rfl: 3   metroNIDAZOLE  (FLAGYL ) 500 MG tablet, TAKE 1 TABLET BY MOUTH TWICE DAILY ON SUNDAY AND MONDAY. (Patient taking differently: Take 1,000 mg by mouth See admin instructions. Take 1,000 mg by mouth on Saturday and Sunday- EVERY OTHER WEEKEND), Disp: 90 tablet, Rfl: 3   NEEDLE, DISP, 18 G (BD DISP NEEDLES) 18G X 1-1/2" MISC, 1 mg by Does not apply route every 14 (fourteen) days., Disp: 50 each, Rfl: 0   NEEDLE, DISP, 21 G (BD DISP NEEDLES) 21G X 1-1/2" MISC, 1 mg by Does not apply route every 14 (fourteen) days., Disp: 50 each, Rfl: 0   omeprazole (PRILOSEC) 20 MG capsule, Take 20 mg by mouth daily as needed (for  heartburn- 30 minutes before a meal)., Disp: , Rfl:    sacubitril -valsartan  (ENTRESTO ) 24-26 MG, Take 1 tablet by mouth 2 (two) times daily., Disp: 180 tablet, Rfl: 3   Syringe, Disposable, (2-3CC SYRINGE) 3 ML MISC, 1 mg by Does not apply route every 14 (fourteen) days., Disp: 25 each, Rfl: 3   terbinafine  (LAMISIL ) 250 MG tablet, Take 1 tablet (250 mg total) by mouth daily., Disp: 30 tablet, Rfl: 1   Testosterone  1.62 % GEL, APPLY 2 PUMPS DAILY (Patient taking differently: Apply 2 Pump topically daily.), Disp: 75 g, Rfl: 5   testosterone  cypionate (DEPOTESTOSTERONE CYPIONATE) 200 MG/ML injection, Inject 1 mL (200 mg total) into the muscle every 28 (twenty-eight) days., Disp: 10 mL, Rfl: 0  Past Medical  History: Past Medical History:  Diagnosis Date   Aortic valve disorders    Arthritis    Atrial flutter (HCC) 06/18/2016   "AF or AFl; not sure which" (06/23/2016)   Basal cell carcinoma    "face, nose left shoulder, left arm" (06/19/2016)   Basal cell carcinoma 09/13/2020   right temple   BBB (bundle branch block)    hx right   Chronic back pain    "neck, thoracic, lower back" (06/19/2016)   Complete heart block (HCC) 06/2016   Dyspnea    GERD (gastroesophageal reflux disease)    Gout    Heart block    "I've had type I, II Wenke before now" (06/19/2016)   History of gout    History of hiatal hernia    "self dx'd" (06/19/2016)   Hyperlipidemia    Hypertension    Lyme disease    "dx'd by me 2003; cx's showed dx 08/2015"   Migraine    "3-4/year" (06/19/2016)   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   PVC's (premature ventricular contractions)    Renal cancer, left (HCC) 2006   S/P cryotherapy   Spinal stenosis    "cervical, 1 thoracic, lumbar" (06/19/2016)   Squamous carcinoma    "face, nose left shoulder, left arm" (06/19/2016)   Stroke (HCC)    TIA (transient ischemic attack) 06/14/2016   "I'm not sure that's what it was" (06/25/2016)   Visit for monitoring Tikosyn  therapy 09/09/2017    Tobacco Use: Social History   Tobacco Use  Smoking Status Never  Smokeless Tobacco Never    Labs: Review Flowsheet  More data exists      Latest Ref Rng & Units 02/07/2020 11/01/2020 08/21/2021 01/09/2022 11/25/2022  Labs for ITP Cardiac and Pulmonary Rehab  Cholestrol 100 - 199 mg/dL 478  295  - 621  -  LDL (calc) 0 - 99 mg/dL 76  70  - 56  -  HDL-C >39 mg/dL 53  41  - 67  -  Trlycerides 0 - 149 mg/dL 47  69  - 39  -  Hemoglobin A1c 4.8 - 5.6 % - - 5.7  5.7  -  PH, Arterial 7.35 - 7.45 - - - - 7.376   PCO2 arterial 32 - 48 mmHg - - - - 41.7   Bicarbonate 20.0 - 28.0 mmol/L - - - - 25.5  24.4   TCO2 22 - 32 mmol/L - - - - 27  26    Acid-base deficit 0.0 - 2.0 mmol/L - - - - 1.0   O2 Saturation % - - - - 70  88     Details       Multiple values  from one day are sorted in reverse-chronological order          Exercise Target Goals: Exercise Program Goal: Individual exercise prescription set using results from initial 6 min walk test and THRR while considering  patient's activity barriers and safety.   Exercise Prescription Goal: Initial exercise prescription builds to 30-45 minutes a day of aerobic activity, 2-3 days per week.  Home exercise guidelines will be given to patient during program as part of exercise prescription that the participant will acknowledge.   Education: Aerobic Exercise: - Group verbal and visual presentation on the components of exercise prescription. Introduces F.I.T.T principle from ACSM for exercise prescriptions.  Reviews F.I.T.T. principles of aerobic exercise including progression. Written material given at graduation.   Education: Resistance Exercise: - Group verbal and visual presentation on the components of exercise prescription. Introduces F.I.T.T principle from ACSM for exercise prescriptions  Reviews F.I.T.T. principles of resistance exercise including progression. Written material given at graduation. Flowsheet Row Cardiac Rehab from 07/30/2023 in [facility] Cardiac and Pulmonary Rehab  Date 07/16/23  Educator [facility]  Instruction Review Code 1- [employer] Understanding        Education: Exercise & Equipment Safety: - Individual verbal instruction and demonstration of equipment use and safety with use of the equipment. Flowsheet Row Cardiac Rehab from 07/30/2023 in [facility] Cardiac and Pulmonary Rehab  Date 05/19/23  Educator [facility]  Instruction Review Code 1- Verbalizes Understanding       Education: Exercise Physiology & General Exercise Guidelines: - Group verbal and written instruction with models to review the exercise physiology of the cardiovascular system and associated  critical values. Provides general exercise guidelines with specific guidelines to those with heart or lung disease.    Education: Flexibility, Balance, Mind/Body Relaxation: - Group verbal and visual presentation with interactive activity on the components of exercise prescription. Introduces F.I.T.T principle from ACSM for exercise prescriptions. Reviews F.I.T.T. principles of flexibility and balance exercise training including progression. Also discusses the mind body connection.  Reviews various relaxation techniques to help reduce and manage stress (i.e. Deep breathing, progressive muscle relaxation, and visualization). Balance handout provided to take home. Written material given at graduation. Flowsheet Row Cardiac Rehab from 07/30/2023 in [facility] Cardiac and Pulmonary Rehab  Date 07/16/23  Educator [facility]  Instruction Review Code 1- Verbalizes Understanding       Activity Barriers & Risk Stratification:  Activity Barriers & Cardiac Risk Stratification - 05/19/23 1526       Activity Barriers & Cardiac Risk Stratification   Activity Barriers Back Problems;Muscular Weakness    Cardiac Risk Stratification High             6 Minute Walk:  6 Minute Walk     Row Name 05/19/23 1525 08/07/23 1401       6 Minute Walk   Phase Initial Discharge    Distance 1360 feet 1390 feet    Distance % Change -- 2.2 %    Distance Feet Change -- 30 ft    Walk Time 6 minutes 6 minutes    # of Rest Breaks 0 0    MPH 2.58 2.63    METS 2.34 2.16    RPE 11 11    Perceived Dyspnea  0 0    VO2 Peak 8.21 7.57    Symptoms No No    Resting HR 84 bpm 63 bpm    Resting BP 118/78 128/66    Resting Oxygen Saturation  98 % 94 %    Exercise Oxygen  Saturation  during 6 min walk 92 % 89 %    Max Ex. HR 117 bpm 97 bpm    Max Ex. BP 132/70 138/72    2 Minute Post BP 120/80 --             Oxygen Initial Assessment:   Oxygen Re-Evaluation:   Oxygen Discharge (Final Oxygen  Re-Evaluation):   Initial Exercise Prescription:  Initial Exercise Prescription - 05/19/23 1500       Date of Initial Exercise RX and Referring Provider   Date 05/19/23    Referring Provider Dr. Antionette Kirks      Oxygen   Maintain Oxygen Saturation 88% or higher      Treadmill   MPH 1.8    Grade 1    Minutes 15    METs 2.63      Recumbant Bike   Level 3    RPM 50    Watts 20    Minutes 15    METs 2.34      NuStep   Level 3    SPM 80    Minutes 15    METs 2.34      REL-XR   Level 3    Speed 50    Minutes 15    METs 2.34      Biostep-RELP   Level 3    SPM 50    Minutes 15    METs 2.34      Prescription Details   Frequency (times per week) 3    Duration Progress to 30 minutes of continuous aerobic without signs/symptoms of physical distress      Intensity   THRR 40-80% of Max Heartrate 103-122    Ratings of Perceived Exertion 11-13    Perceived Dyspnea 0-4      Progression   Progression Continue to progress workloads to maintain intensity without signs/symptoms of physical distress.      Resistance Training   Training Prescription Yes    Weight 7    Reps 10-15             Perform Capillary Blood Glucose checks as needed.  Exercise Prescription Changes:   Exercise Prescription Changes     Row Name 05/19/23 1500 06/02/23 1500 06/17/23 1500 06/30/23 1400 07/02/23 0800     Response to Exercise   Blood Pressure (Admit) 118/78 106/60 136/66 -- 152/80   Blood Pressure (Exercise) 132/70 122/56 155/66 -- 152/74   Blood Pressure (Exit) 120/80 126/60 126/64 -- 130/66   Heart Rate (Admit) 84 bpm 89 bpm 82 bpm -- 50 bpm   Heart Rate (Exercise) 117 bpm 117 bpm 123 bpm -- 128 bpm   Heart Rate (Exit) 66 bpm 67 bpm 70 bpm -- 64 bpm   Oxygen Saturation (Admit) 98 % -- -- -- --   Oxygen Saturation (Exercise) 92 % -- -- -- --   Oxygen Saturation (Exit) 99 % -- -- -- --   Rating of Perceived Exertion (Exercise) 11 13 13  -- 13   Perceived Dyspnea  (Exercise) 0 -- 0 -- 0   Symptoms none none none -- none   Comments 6 MWT results first 2 exercise sessions -- -- --   Duration -- Progress to 30 minutes of  aerobic without signs/symptoms of physical distress Progress to 30 minutes of  aerobic without signs/symptoms of physical distress -- Progress to 30 minutes of  aerobic without signs/symptoms of physical distress   Intensity -- THRR unchanged THRR unchanged -- THRR unchanged  Progression   Progression -- Continue to progress workloads to maintain intensity without signs/symptoms of physical distress. Continue to progress workloads to maintain intensity without signs/symptoms of physical distress. -- Continue to progress workloads to maintain intensity without signs/symptoms of physical distress.   Average METs -- 2.38 2.8 -- 3.45     Resistance Training   Training Prescription -- Yes Yes -- Yes   Weight -- 7 7 -- 7   Reps -- 10-15 10-15 -- 10-15     Interval Training   Interval Training -- -- No -- No     Oxygen   Oxygen -- Continuous -- -- --     Treadmill   MPH -- 2.2 2.2 -- 3   Grade -- 1 0 -- 0.5   Minutes -- 15 15 -- 15   METs -- 2.99 -- -- 3.5     NuStep   Level -- 3 -- -- --   SPM -- 80 -- -- --   Minutes -- 15 -- -- --   METs -- 2 -- -- --     REL-XR   Level -- -- 3 -- 4   Minutes -- -- 15 -- 15   METs -- -- 3.2 -- 3.9     T5 Nustep   Level -- 1 -- -- --   SPM -- 80 -- -- --   Minutes -- 15 -- -- --   METs -- 2 -- -- --     Home Exercise Plan   Plans to continue exercise at -- -- -- [facility]  Pt plans to join [employer] for aerobic equipment, Walking dog around 1 mile a day [employer]  Pt plans to join [employer] for aerobic equipment, Walking dog around 1 mile a day   Frequency -- -- -- Add 2 additional days to program exercise sessions. Add 2 additional days to program exercise sessions.   Initial Home Exercises Provided -- -- -- 06/30/23 06/30/23     Oxygen   Maintain Oxygen  Saturation -- 88% or higher 88% or higher 88% or higher 88% or higher    Row Name 07/17/23 1700 07/28/23 1400 08/14/23 0800 08/28/23 1500       Response to Exercise   Blood Pressure (Admit) 120/78 122/64 128/66 136/70    Blood Pressure (Exercise) 134/64 -- 138/72 --    Blood Pressure (Exit) 110/70 124/66 124/68 108/62    Heart Rate (Admit) 59 bpm 66 bpm 63 bpm 60 bpm    Heart Rate (Exercise) 121 bpm 120 bpm 122 bpm 98 bpm    Heart Rate (Exit) 73 bpm 68 bpm 66 bpm 64 bpm    Oxygen Saturation (Admit) -- -- 94 % --    Oxygen Saturation (Exercise) -- -- 89 % --    Oxygen Saturation (Exit) -- -- 89 % --    Rating of Perceived Exertion (Exercise) 13 13 13 13     Perceived Dyspnea (Exercise) -- -- 0 --    Symptoms none none none none    Duration Continue with 30 min of aerobic exercise without signs/symptoms of physical distress. Continue with 30 min of aerobic exercise without signs/symptoms of physical distress. Continue with 30 min of aerobic exercise without signs/symptoms of physical distress. Continue with 30 min of aerobic exercise without signs/symptoms of physical distress.    Intensity THRR unchanged THRR unchanged THRR unchanged THRR unchanged      Progression   Progression Continue to progress workloads to maintain intensity without  signs/symptoms of physical distress. Continue to progress workloads to maintain intensity without signs/symptoms of physical distress. Continue to progress workloads to maintain intensity without signs/symptoms of physical distress. Continue to progress workloads to maintain intensity without signs/symptoms of physical distress.    Average METs 3.28 3.62 3.88 4.23      Resistance Training   Training Prescription Yes Yes Yes Yes    Weight 7 8 8 8  lb    Reps 10-15 10-15 10-15 10-15      Interval Training   Interval Training No No No No      Treadmill   MPH 3.2 3.2 3.4 3.2    Grade 0.5 0.5 0 0    Minutes 15 15 15 15     METs 3.67 3.67 3.6 3.45       NuStep   Level 3 -- 4  T6 --    Minutes 15 -- 15 --    METs 2.6 -- 2.7 --      REL-XR   Level 4 8 5 9     Minutes 15 15 15 15     METs 4.1 5.4 6.4 5      T5 Nustep   Level 4 4 -- --    Minutes 15 15 -- --    METs 2.8 2.5 -- --      Home Exercise Plan   Plans to continue exercise at [facility]  Pt plans to join [employer] for aerobic equipment, Walking dog around 1 mile a day [employer]  Pt plans to join [employer] for aerobic equipment, Walking dog around 1 mile a day [employer]  Pt plans to join [employer] for aerobic equipment, Walking dog around 1 mile a day [employer]  Pt plans to join [employer] for aerobic equipment, Walking dog around 1 mile a day    Frequency Add 2 additional days to program exercise sessions. Add 2 additional days to program exercise sessions. Add 2 additional days to program exercise sessions. Add 2 additional days to program exercise sessions.    Initial Home Exercises Provided 06/30/23 06/30/23 06/30/23 06/30/23      Oxygen   Maintain Oxygen Saturation -- 88% or higher 88% or higher 88% or higher             Exercise Comments:   Exercise Comments     Row Name 05/21/23 1505           Exercise Comments First full day of exercise!  Patient was oriented to gym and equipment including functions, settings, policies, and procedures.  Patient's individual exercise prescription and treatment plan were reviewed.  All starting workloads were established based on the results of the 6 minute walk test done at initial orientation visit.  The plan for exercise progression was also introduced and progression will be customized based on patient's performance and goals.                Exercise Goals and Review:   Exercise Goals     Row Name 05/19/23 1530             Exercise Goals   Increase Physical Activity Yes       Intervention Provide advice, education, support and counseling about physical activity/exercise  needs.;Develop an individualized exercise prescription for aerobic and resistive training based on initial evaluation findings, risk stratification, comorbidities and participant's personal goals.       Expected Outcomes Short Term: Attend rehab on a regular basis to increase amount  of physical activity.;Long Term: Add in home exercise to make exercise part of routine and to increase amount of physical activity.;Long Term: Exercising regularly at least 3-5 days a week.       Increase Strength and Stamina Yes       Intervention Provide advice, education, support and counseling about physical activity/exercise needs.;Develop an individualized exercise prescription for aerobic and resistive training based on initial evaluation findings, risk stratification, comorbidities and participant's personal goals.       Expected Outcomes Short Term: Increase workloads from initial exercise prescription for resistance, speed, and METs.;Short Term: Perform resistance training exercises routinely during rehab and add in resistance training at home;Long Term: Improve cardiorespiratory fitness, muscular endurance and strength as measured by increased METs and functional capacity ( )       Able to understand and use rate of perceived exertion (RPE) scale Yes       Intervention Provide education and explanation on how to use RPE scale       Expected Outcomes Short Term: Able to use RPE daily in rehab to express subjective intensity level;Long Term:  Able to use RPE to guide intensity level when exercising independently       Able to understand and use Dyspnea scale Yes       Intervention Provide education and explanation on how to use Dyspnea scale       Expected Outcomes Short Term: Able to use Dyspnea scale daily in rehab to express subjective sense of shortness of breath during exertion;Long Term: Able to use Dyspnea scale to guide intensity level when exercising independently       Knowledge and understanding of  Target Heart Rate Range (THRR) Yes       Intervention Provide education and explanation of THRR including how the numbers were predicted and where they are located for reference       Expected Outcomes Short Term: Able to state/look up THRR;Long Term: Able to use THRR to govern intensity when exercising independently;Short Term: Able to use daily as guideline for intensity in rehab       Able to check pulse independently Yes       Intervention Provide education and demonstration on how to check pulse in carotid and radial arteries.;Review the importance of being able to check your own pulse for safety during independent exercise       Expected Outcomes Short Term: Able to explain why pulse checking is important during independent exercise;Long Term: Able to check pulse independently and accurately       Understanding of Exercise Prescription Yes       Intervention Provide education, explanation, and written materials on patient's individual exercise prescription       Expected Outcomes Short Term: Able to explain program exercise prescription;Long Term: Able to explain home exercise prescription to exercise independently                Exercise Goals Re-Evaluation :  Exercise Goals Re-Evaluation     Row Name 05/21/23 1508 06/02/23 1558 06/11/23 1406 06/17/23 1531 06/30/23 1429     Exercise Goal Re-Evaluation   Exercise Goals Review Increase Physical Activity;Able to understand and use rate of perceived exertion (RPE) scale;Knowledge and understanding of Target Heart Rate Range (THRR);Understanding of Exercise Prescription;Increase Strength and Stamina;Able to check pulse independently Increase Physical Activity;Understanding of Exercise Prescription;Increase Strength and Stamina Increase Physical Activity;Increase Strength and Stamina;Understanding of Exercise Prescription Increase Physical Activity;Increase Strength and Stamina;Understanding of Exercise Prescription Increase Physical  Activity;Able to  understand and use Dyspnea scale;Understanding of Exercise Prescription;Knowledge and understanding of Target Heart Rate Range (THRR);Increase Strength and Stamina;Able to check pulse independently;Able to understand and use rate of perceived exertion (RPE) scale   Comments Reviewed RPE and dyspnea scale, THR and program prescription with pt today.  Pt voiced understanding and was given a copy of goals to take home. Bexton is off to a good start in the program. He has completed 2 exercise sessions. He did well with his exercise prescription and even increased his treadmill workload to a speed of 2. and incline of 1%. He also added T5 nustep to his exercise prescription. We will continue to monitor his progress in the program. Erlin is doing well at rehab, feels he is out of shape but is determined to get back to where he was before the surgeries. Currently on treadmill at 1.8 speed. He is happy with the ability to workout safely while being monitored. Encouraged him to continue to workout here at rehab and doing exercise safely when at home. Nang is doing well in rehab. He was able to maintain his speed on the treadmill but decreased his incline from 1% to 0. He was also able to maintain a level of 3 on the XR. We will continue to encourage and monitor his progression in the program. Reviewed home exercise with pt today.  Pt plans to join the [employer] for the aerobic equipment, and also will continue to walk his dog about 1 mile a day for exercise.  Reviewed THR, pulse, RPE, sign and symptoms, pulse oximetery and when to call 911 or MD.  Also discussed weather considerations and indoor options.  Pt voiced understanding.   Expected Outcomes Short: Use RPE daily to regulate intensity.  Long: Follow program prescription in THR. Short: Continue to follow current exercise prescription. Long: Continue exercise to improve strength and stamina. STG: continue to come to rehab and increase workload as  able. LTG: Continue to improve strenght and stamina Short: Continue to follow exercise prescription and increase workloads when able. Long: Continue exercise to improve strength and stamina. Short: Join [employer] for aerobic equipment and exercise. Long: Continue exercise to improve strength and stamina.    Row Name 07/02/23 1610 07/17/23 1742 07/23/23 1414 07/28/23 1447 08/14/23 0844     Exercise Goal Re-Evaluation   Exercise Goals Review Increase Physical Activity;Increase Strength and Stamina;Understanding of Exercise Prescription Increase Physical Activity;Increase Strength and Stamina;Understanding of Exercise Prescription Increase Physical Activity;Increase Strength and Stamina;Understanding of Exercise Prescription Increase Physical Activity;Increase Strength and Stamina;Understanding of Exercise Prescription Increase Physical Activity;Increase Strength and Stamina;Understanding of Exercise Prescription   Comments Purvis is doing well in rehab. He was able to increase his treadmill workload to a speed of and an incline of 0.5%. He was also able to increase from level 3 to 4 on the XR. We will continue to monitor his progress in the program. Jeffrey is doing well in rehab. He was able to increase his treadmill workload to a speed of 3.2 mph and maintain an incline of 0.5%. He was also able to maintain level 4 on the XR and increase to level 4 on the T5 nustep. We will continue to monitor his progress in the program. Mihael is walking and doing weight exercises at home fairly consistently. He is consistently getting 3-4 days a week of exercise in each week, including his days in rehab. He was encouraged to work towards 4 days a week  of exercise consistently. Exercise progression and  interval training was discussed with patient. He may start incorporating interval training as he feels ready to progress to higher intensities. THRR was reviewed and patient was encouraged to look into buying a pulse ox to  better monitor his heart rate a home while exercising. Krist continues to do well in rehab. He increased to level 8 on the XR and level 4 on the T5 nustep. He has maintained his workload on the treadmill at a speed of 3.2 mph and incline of 0.5%. He also increased his handweights to 8 lbs for resistance. We will continue to monitor his progress in the program. Konnar continues to do well in rehab and will graduate very soon. He completed his post and walked 1390 ft, which is a 2.2% improvement from his initial. He increased to level 4 on the T6 nustep. He also increased his speed on the treadmill to 3.4 mph. We will continue to monitor his progress in the program.   Expected Outcomes Short: Continue to increase treadmill workload. Long: Continue exercise to improve strength and stamina. Short: Continue to progressively increase workload on the XR and treadmill. Long: Continue exercise to improve strength and stamina. Short: try interval training in cardiac rehab and purchase a pulse ox to help monitor heart rate during exercise at home. Long: join [employer] after graduation and continue with independent exercise routien. Short: Continue to progressively increase treadmill workload. Long: Continue exercise to improve strength and stamina. Short: Graduate. Long: Continue to exercise independently.    Row Name 08/28/23 1600             Exercise Goal Re-Evaluation   Exercise Goals Review Increase Physical Activity;Increase Strength and Stamina;Understanding of Exercise Prescription       Comments Qui is doing well in rehab but has only attended one session since the last review. He did improve to level 9 on the XR and continues to walk above 3 mph on the treadmill. We will continue to monitor his progress until he graduates from the program.       Expected Outcomes Short: Return to rehab and graduate. Long: Continue to exercise independently.                Discharge Exercise Prescription (Final  Exercise Prescription Changes):  Exercise Prescription Changes - 08/28/23 1500       Response to Exercise   Blood Pressure (Admit) 136/70    Blood Pressure (Exit) 108/62    Heart Rate (Admit) 60 bpm    Heart Rate (Exercise) 98 bpm    Heart Rate (Exit) 64 bpm    Rating of Perceived Exertion (Exercise) 13    Symptoms none    Duration Continue with 30 min of aerobic exercise without signs/symptoms of physical distress.    Intensity THRR unchanged      Progression   Progression Continue to progress workloads to maintain intensity without signs/symptoms of physical distress.    Average METs 4.23      Resistance Training   Training Prescription Yes    Weight 8 lb    Reps 10-15      Interval Training   Interval Training No      Treadmill   MPH 3.2    Grade 0    Minutes 15    METs 3.45      REL-XR   Level 9    Minutes 15    METs 5      Home Exercise Plan   Plans to continue exercise at  [facility]   Pt plans to join [employer] for aerobic equipment, Walking dog around 1 mile a day   Frequency Add 2 additional days to program exercise sessions.    Initial Home Exercises Provided 06/30/23      Oxygen   Maintain Oxygen Saturation 88% or higher             Nutrition:  Target Goals: Understanding of nutrition guidelines, daily intake of sodium 1500mg , cholesterol 200mg , calories 30% from fat and 7% or less from saturated fats, daily to have 5 or more servings of fruits and vegetables.  Education: All About Nutrition: -Group instruction provided by verbal, written material, interactive activities, discussions, models, and posters to present general guidelines for heart healthy nutrition including fat, fiber, MyPlate, the role of sodium in heart healthy nutrition, utilization of the nutrition label, and utilization of this knowledge for meal planning. Follow up email sent as well. Written material given at graduation. Flowsheet Row Cardiac Rehab from 07/30/2023 in  [facility] Cardiac and Pulmonary Rehab  Date 07/30/23  Educator JG  Instruction Review Code 1- Verbalizes Understanding       Biometrics:  Pre Biometrics - 05/19/23 1531       Pre Biometrics   Height 5' 5.5" (1.664 m)    Weight 148 lb 3.2 oz (67.2 kg)    Waist Circumference 35 inches    Hip Circumference 38 inches    Waist to Hip Ratio 0.92 %    BMI (Calculated) 24.28    Single Leg Stand 2.47 seconds             Post Biometrics - 08/07/23 1402        Post  Biometrics   Height 5' 5.5" (1.664 m)    Weight 157 lb 3.2 oz (71.3 kg)    Waist Circumference 33 inches    Hip Circumference 36 inches    Waist to Hip Ratio 0.92 %    BMI (Calculated) 25.75    Single Leg Stand 6.52 seconds             Nutrition Therapy Plan and Nutrition Goals:  Nutrition Therapy & Goals - 05/19/23 1621       Nutrition Therapy   Diet Cardiac, low Na    Protein (specify units) 60-75g    Fiber 30 grams    Whole Grain Foods 3 servings    Saturated Fats 15 max. grams    Fruits and Vegetables 5 servings/day    Sodium 2 grams      Personal Nutrition Goals   Nutrition Goal Drink 64oz of water daily    Personal Goal #2 Eat 15-20gProtein and 30-60gCarbs at each meal.    Personal Goal #3 Read labels and reduce sodium intake to below 2300mg . Ideally 1500mg  per day.    Comments Patient recovering from hospital stay and reports appetite has improved a lot since his inpatient stay, says it could still use more improvement. Spoke today about ways to meet nutritional needs with less quantity of foods, since her reports some nausea and early satiety. Encouraged him to eat a quality protein ~15-20g per meal paired with a complex carb. Reviewed some good pairings and answered questions around protein, types of fats, sodium, and sugar. Reviewed a few facts labels and provided advice on how to read labels and determine if the foods are valuable for his nutritional goals. Encouraged efficient foods like beans,  lentils, and hummus. Healthy fat rich foods like peanuts, peanut butter, fish, and olive  oil to increase calories with less quantity of food. Wanted to set goal of ~64oz of water daily, reminded him to ensure water intake doesn't further reduce food intake, suggested he sip on water between meal and keep water at meal to what is needed to enjoy the meal and ensure safe swallowing when appetite is poor. He is optimistic that his appetite will improve and he will be able to eat more normally. Will continue to monitor and support      Intervention Plan   Intervention Prescribe, educate and counsel regarding individualized specific dietary modifications aiming towards targeted core components such as weight, hypertension, lipid management, diabetes, heart failure and other comorbidities.;Nutrition handout(s) given to patient.    Expected Outcomes Short Term Goal: Understand basic principles of dietary content, such as calories, fat, sodium, cholesterol and nutrients.;Short Term Goal: A plan has been developed with personal nutrition goals set during dietitian appointment.;Long Term Goal: Adherence to prescribed nutrition plan.             Nutrition Assessments:  MEDIFICTS Score Key: >=70 Need to make dietary changes  40-70 Heart Healthy Diet <= 40 Therapeutic Level Cholesterol Diet  Flowsheet Row Cardiac Rehab from 05/19/2023 in [facility] Cardiac and Pulmonary Rehab  Picture Your Plate Total Score on Admission 75      Picture Your Plate Scores: <63 Unhealthy dietary pattern with much room for improvement. 41-50 Dietary pattern unlikely to meet recommendations for good health and room for improvement. 51-60 More healthful dietary pattern, with some room for improvement.  >60 Healthy dietary pattern, although there may be some specific behaviors that could be improved.    Nutrition Goals Re-Evaluation:  Nutrition Goals Re-Evaluation     Row Name 06/11/23 1417 07/10/23 1423 07/30/23 1354          Goals   Nutrition Goal -- -- Drink 64oz of water daily     Comment Biruk reports his appetite is improving and he is tolerating food better. Remembers discussed with RD about meeting protein goals and limiting sodium and saturated fat. Reviewed some good snack ideas with healthy fats, complex carbs and protein rich foods. Muhannad states that he is doing well with his diet. He has been drinking plenty of water every day. He is still working on getting plenty of protein in his diet. He is also trying to limit sugars and sodium in his diet. He feels he is overall doing well with the dietary guidelines he discussed with the RD. Fleming states that he is making an effort to drink more water each day. He feels comfortable reading food labels and does not add salt at the table. He has been watching his sodium intake for many years and reports that he feels he is doing well with this. He is trying to incorporate more protein into his diet and includes meat, beans, gin pro protein powder.     Expected Outcome STG: continue to eat heart healthy and focus on recovering appetite. LTG: maintain heart healthy lifestyle Short: Continue to work on getting protein and limiting sodium. Long: Continue to practice heart healthy eating patterns discussed with RD. Short: continue to read food lables and make sure he is getting enough protein each day and limiting sodium. Long: continue to follow heart healthy diet.       Personal Goal #2 Re-Evaluation   Personal Goal #2 -- -- Eat 15-20gProtein and 30-60gCarbs at each meal.       Personal Goal #3 Re-Evaluation   Personal Goal #  3 -- -- Read labels and reduce sodium intake to below 2300mg . Ideally 1500mg  per day.              Nutrition Goals Discharge (Final Nutrition Goals Re-Evaluation):  Nutrition Goals Re-Evaluation - 07/30/23 1354       Goals   Nutrition Goal Drink 64oz of water daily    Comment Deyonta states that he is making an effort to drink more water each day.  He feels comfortable reading food labels and does not add salt at the table. He has been watching his sodium intake for many years and reports that he feels he is doing well with this. He is trying to incorporate more protein into his diet and includes meat, beans, gin pro protein powder.    Expected Outcome Short: continue to read food lables and make sure he is getting enough protein each day and limiting sodium. Long: continue to follow heart healthy diet.      Personal Goal #2 Re-Evaluation   Personal Goal #2 Eat 15-20gProtein and 30-60gCarbs at each meal.      Personal Goal #3 Re-Evaluation   Personal Goal #3 Read labels and reduce sodium intake to below 2300mg . Ideally 1500mg  per day.             Psychosocial: Target Goals: Acknowledge presence or absence of significant depression and/or stress, maximize coping skills, provide positive support system. Participant is able to verbalize types and ability to use techniques and skills needed for reducing stress and depression.   Education: Stress, Anxiety, and Depression - Group verbal and visual presentation to define topics covered.  Reviews how body is impacted by stress, anxiety, and depression.  Also discusses healthy ways to reduce stress and to treat/manage anxiety and depression.  Written material given at graduation. Flowsheet Row Cardiac Rehab from 07/30/2023 in [facility] Cardiac and Pulmonary Rehab  Date 07/02/23  Educator [facility]  Instruction Review Code 1- [employer] Understanding       Education: Sleep Hygiene -Provides group verbal and written instruction about how sleep can affect your health.  Define sleep hygiene, discuss sleep cycles and impact of sleep habits. Review good sleep hygiene tips.    Initial Review & Psychosocial Screening:  Initial Psych Review & Screening - 04/21/23 1417       Initial Review   Current issues with Current Psychotropic Meds      Family Dynamics   Concerns Recent loss of significant other     Comments loss of 4 significant family members in the last year      Barriers   Psychosocial barriers to participate in program There are no identifiable barriers or psychosocial needs.;The patient should benefit from training in stress management and relaxation.      Screening Interventions   Interventions Encouraged to exercise;To provide support and resources with identified psychosocial needs;Provide feedback about the scores to participant    Expected Outcomes Short Term goal: Utilizing psychosocial counselor, staff and physician to assist with identification of specific Stressors or current issues interfering with healing process. Setting desired goal for each stressor or current issue identified.;Long Term Goal: Stressors or current issues are controlled or eliminated.;Short Term goal: Identification and review with participant of any Quality of Life or Depression concerns found by scoring the questionnaire.;Long Term goal: The participant improves quality of Life and PHQ9 Scores as seen by post scores and/or verbalization of changes             Quality of Life Scores:  Quality of Life - 05/19/23 1533       Quality of Life   Select Quality of Life      Quality of Life Scores   Health/Function Pre 24.43 %    Socioeconomic Pre 29.58 %    Psych/Spiritual Pre 24 %    Family Pre 30 %    GLOBAL Pre 26.12 %            Scores of 19 and below usually indicate a poorer quality of life in these areas.  A difference of  2-3 points is a clinically meaningful difference.  A difference of 2-3 points in the total score of the Quality of Life Index has been associated with significant improvement in overall quality of life, self-image, physical symptoms, and general health in studies assessing change in quality of life.  PHQ-9: Review Flowsheet  More data exists      05/19/2023 01/09/2022 09/26/2021 08/21/2021 10/17/2020  Depression screen PHQ 2/9  Decreased Interest 1 0 0 0 0  Down,  Depressed, Hopeless 0 0 0 0 0  PHQ - 2 Score 1 0 0 0 0  Altered sleeping 1 0 0 1 0  Tired, decreased energy 1 1 0 2 1  Change in appetite 1 0 0 0 1  Feeling bad or failure about yourself  0 0 0 0 0  Trouble concentrating 0 0 0 0 0  Moving slowly or fidgety/restless 0 0 0 0 0  Suicidal thoughts 0 0 0 0 0  PHQ-9 Score 4 1 0 3 2  Difficult doing work/chores Somewhat difficult Not difficult at all Not difficult at all Not difficult at all Not difficult at all   Interpretation of Total Score  Total Score Depression Severity:  1-4 = Minimal depression, 5-9 = Mild depression, 10-14 = Moderate depression, 15-19 = Moderately severe depression, 20-27 = Severe depression   Psychosocial Evaluation and Intervention:  Psychosocial Evaluation - 04/21/23 1442       Psychosocial Evaluation & Interventions   Interventions Encouraged to exercise with the program and follow exercise prescription    Comments Mucaad has no barriers to attending the program. He is ready to start and get back to his exercise routine. He lives with his wife and he has her as his support.   He has loss 4 siginificant members of his family in the past year.   He does take wellbutrin  and cymbalta ,[facility] for "[facility] fog", the meds are helping as he goes through with all his loss this year.    Expected Outcomes STG attends all scheduled sessions, continues with his meds as prescribed to help  deal with his life changes.  LTG Able to continue to manage with his meds as prescribed    Continue Psychosocial Services  Follow up required by staff             Psychosocial Re-Evaluation:  Psychosocial Re-Evaluation     Row Name 06/11/23 1415 07/10/23 1421 07/30/23 1409         Psychosocial Re-Evaluation   Current issues with None Identified None Identified None Identified     Comments Tayari reports a good mindset and is not exeperiencing any stress, anxiety or depression. He says he has a good support system  with family. Says he is sleeping well, wakes up well rested. Damondre reportd no major stressors at this time. He enjoys walking and woodworking for stress relief. He has a good support system around him made up  primarily of his wife and two daughters. Dade reports no issues with sleep at this time. Reiss reports that he has no concerns with his stress levels or mental health at this time. He states that he has a stable support system. His only concerns is his sleep patterns. He has inconsistent sleep patterns and has been working with his doctor on this. He is in the process of getting set up with a CPAP. He does complain of having brain fog during the day but not really feeling tired. He will continue to work with his doctor to get set up with his CPAP to see if this helps his quality of sleep and day time brain fog.     Expected Outcomes STG: continue to use support system and attend cardiac rehab and education classes. LTG: maintain positive outlook on health and lifestyle changes Short: Continue to exercise for stress relief. Long: Continue to maintain positive outlook. Short: get set up with CPAP machine. Long: get into healhty sleep patterns and maintian this long term.     Interventions Encouraged to attend Cardiac Rehabilitation for the exercise Encouraged to attend Cardiac Rehabilitation for the exercise Encouraged to attend Cardiac Rehabilitation for the exercise     Continue Psychosocial Services  Follow up required by staff Follow up required by staff Follow up required by staff              Psychosocial Discharge (Final Psychosocial Re-Evaluation):  Psychosocial Re-Evaluation - 07/30/23 1409       Psychosocial Re-Evaluation   Current issues with None Identified    Comments Wadie reports that he has no concerns with his stress levels or mental health at this time. He states that he has a stable support system. His only concerns is his sleep patterns. He has inconsistent sleep patterns and has  been working with his doctor on this. He is in the process of getting set up with a CPAP. He does complain of having brain fog during the day but not really feeling tired. He will continue to work with his doctor to get set up with his CPAP to see if this helps his quality of sleep and day time brain fog.    Expected Outcomes Short: get set up with CPAP machine. Long: get into healhty sleep patterns and maintian this long term.    Interventions Encouraged to attend Cardiac Rehabilitation for the exercise    Continue Psychosocial Services  Follow up required by staff             Vocational Rehabilitation: Provide vocational rehab assistance to qualifying candidates.   Vocational Rehab Evaluation & Intervention:   Education: Education Goals: Education classes will be provided on a variety of topics geared toward better understanding of heart health and risk factor modification. Participant will state understanding/return demonstration of topics presented as noted by education test scores.  Learning Barriers/Preferences:   General Cardiac Education Topics:  AED/CPR: - Group verbal and written instruction with the use of models to demonstrate the basic use of the AED with the basic ABC's of resuscitation.   Anatomy and Cardiac Procedures: - Group verbal and visual presentation and models provide information about basic cardiac anatomy and function. Reviews the testing methods done to diagnose heart disease and the outcomes of the test results. Describes the treatment choices: Medical Management, Angioplasty, or Coronary Bypass Surgery for treating various heart conditions including Myocardial Infarction, Angina, Valve Disease, and Cardiac Arrhythmias.  Written material given at graduation.   Medication  Safety: - Group verbal and visual instruction to review commonly prescribed medications for heart and lung disease. Reviews the medication, class of the drug, and side effects. Includes  the steps to properly store meds and maintain the prescription regimen.  Written material given at graduation. Flowsheet Row Cardiac Rehab from 07/30/2023 in [facility] Cardiac and Pulmonary Rehab  Date 06/11/23  Educator SB  Instruction Review Code 1- Verbalizes Understanding       Intimacy: - Group verbal instruction through game format to discuss how heart and lung disease can affect sexual intimacy. Written material given at graduation..   Know Your Numbers and Heart Failure: - Group verbal and visual instruction to discuss disease risk factors for cardiac and pulmonary disease and treatment options.  Reviews associated critical values for Overweight/Obesity, Hypertension, Cholesterol, and Diabetes.  Discusses basics of heart failure: signs/symptoms and treatments.  Introduces Heart Failure Zone chart for action plan for heart failure.  Written material given at graduation. Flowsheet Row Cardiac Rehab from 07/30/2023 in [facility] Cardiac and Pulmonary Rehab  Education need identified 05/19/23  Date 06/18/23  Educator SB  Instruction Review Code 1- Verbalizes Understanding       Infection Prevention: - Provides verbal and written material to individual with discussion of infection control including proper hand washing and proper equipment cleaning during exercise session. Flowsheet Row Cardiac Rehab from 07/30/2023 in [facility] Cardiac and Pulmonary Rehab  Date 05/19/23  Educator [facility]  Instruction Review Code 1- Verbalizes Understanding       Falls Prevention: - Provides verbal and written material to individual with discussion of falls prevention and safety. Flowsheet Row Cardiac Rehab from 07/30/2023 in [facility] Cardiac and Pulmonary Rehab  Date 05/19/23  Educator [facility]  Instruction Review Code 1- Verbalizes Understanding       Other: -Provides group and verbal instruction on various topics (see comments)   Knowledge Questionnaire Score:  Knowledge Questionnaire Score - 05/19/23 1533        Knowledge Questionnaire Score   Pre Score 24/26             Core Components/Risk Factors/Patient Goals at Admission:  Personal Goals and Risk Factors at Admission - 05/19/23 1532       Core Components/Risk Factors/Patient Goals on Admission    Weight Management Yes    Intervention Weight Management: Provide education and appropriate resources to help participant work on and attain dietary goals.;Weight Management: Develop a combined nutrition and exercise program designed to reach desired caloric intake, while maintaining appropriate intake of nutrient and fiber, sodium and fats, and appropriate energy expenditure required for the weight goal.    Admit Weight 148 lb 3.2 oz (67.2 kg)    Goal Weight: Short Term 148 lb (67.1 kg)    Goal Weight: Long Term 148 lb (67.1 kg)    Expected Outcomes Short Term: Continue to assess and modify interventions until short term weight is achieved;Long Term: Adherence to nutrition and physical activity/exercise program aimed toward attainment of established weight goal;Weight Maintenance: Understanding of the daily nutrition guidelines, which includes 25-35% calories from fat, 7% or less cal from saturated fats, less than 200mg  cholesterol, less than 1.5gm of sodium, & 5 or more servings of fruits and vegetables daily;Understanding recommendations for meals to include 15-35% energy as protein, 25-35% energy from fat, 35-60% energy from carbohydrates, less than 200mg  of dietary cholesterol, 20-35 gm of total fiber daily;Understanding of distribution of calorie intake throughout the day with the consumption of 4-5 meals/snacks    Heart Failure  Yes    Intervention Provide a combined exercise and nutrition program that is supplemented with education, support and counseling about heart failure. Directed toward relieving symptoms such as shortness of breath, decreased exercise tolerance, and extremity edema.    Expected Outcomes Improve functional capacity of  life;Short term: Attendance in program 2-3 days a week with increased exercise capacity. Reported lower sodium intake. Reported increased fruit and vegetable intake. Reports medication compliance.;Short term: Daily weights obtained and reported for increase. Utilizing diuretic protocols set by physician.;Long term: Adoption of self-care skills and reduction of barriers for early signs and symptoms recognition and intervention leading to self-care maintenance.    Hypertension Yes    Intervention Provide education on lifestyle modifcations including regular physical activity/exercise, weight management, moderate sodium restriction and increased consumption of fresh fruit, vegetables, and low fat dairy, alcohol moderation, and smoking cessation.;Monitor prescription use compliance.    Expected Outcomes Short Term: Continued assessment and intervention until BP is < 140/49mm HG in hypertensive participants. < 130/22mm HG in hypertensive participants with diabetes, heart failure or chronic kidney disease.;Long Term: Maintenance of blood pressure at goal levels.    Lipids Yes    Intervention Provide education and support for participant on nutrition & aerobic/resistive exercise along with prescribed medications to achieve LDL 70mg , HDL >40mg .    Expected Outcomes Short Term: Participant states understanding of desired cholesterol values and is compliant with medications prescribed. Participant is following exercise prescription and nutrition guidelines.;Long Term: Cholesterol controlled with medications as prescribed, with individualized exercise RX and with personalized nutrition plan. Value goals: LDL < 70mg , HDL > 40 mg.             Education:Diabetes - Individual verbal and written instruction to review signs/symptoms of diabetes, desired ranges of glucose level fasting, after meals and with exercise. Acknowledge that pre and post exercise glucose checks will be done for 3 sessions at entry of  program.   Core Components/Risk Factors/Patient Goals Review:   Goals and Risk Factor Review     Row Name 06/11/23 1420 07/10/23 1426 07/30/23 1359         Core Components/Risk Factors/Patient Goals Review   Personal Goals Review Weight Management/Obesity Weight Management/Obesity;Hypertension Heart Failure;Hypertension;Lipids     Review He lost a lot of weight after surgery, appetite and taste were gone. This has improved and he reports he has gained back ~5lbs since starting rehab. Commended him on his recovery and reminded him of caloire and nutrient dense foods to add to meals and snacks. He states that his weight has been maintained around 155-160 lb. He states that he is more interested in gaining muscle mass more than he is the number on the scale. He also reports that he checks his BP at home routinely. He also states that his BP readings have been good and within normal ranges when he checks it. Pranish states that he continues to try to gain muscle mass and maintain weight. He takes all his cholesterol meds and follows up with his doctor with lab work to manage this. He checks his weight and blood pressure at home. He states he is aware of the signs of heart failure and what to look out for. His BP and weight have been steady.     Expected Outcomes STG: increase strength, weight stabilization. LTG: maintain and achieve strong healthy weight Short: Continue to work towards gaining muscle mass. Long: Continue to manage lifestyle risk factors. Short: cotninue to work towards building muscle mass and strength.  continue to monitor weight and BP at home. Long: continue to control cardiac risk ractors.              Core Components/Risk Factors/Patient Goals at Discharge (Final Review):   Goals and Risk Factor Review - 07/30/23 1359       Core Components/Risk Factors/Patient Goals Review   Personal Goals Review Heart Failure;Hypertension;Lipids    Review Virgil states that he continues to try  to gain muscle mass and maintain weight. He takes all his cholesterol meds and follows up with his doctor with lab work to manage this. He checks his weight and blood pressure at home. He states he is aware of the signs of heart failure and what to look out for. His BP and weight have been steady.    Expected Outcomes Short: cotninue to work towards building muscle mass and strength. continue to monitor weight and BP at home. Long: continue to control cardiac risk ractors.             ITP Comments:  ITP Comments     Row Name 04/21/23 1442 05/19/23 1524 05/21/23 1310 05/21/23 1505 06/18/23 1127   ITP Comments Virtual orientation call completed today. he has an appointment on Date: 04/24/2023  for EP eval and gym Orientation.  Documentation of diagnosis can be found in [facility] Date: 04/11/2023 . Completed and gym orientation. Initial ITP created and sent for review to Dr. Firman Hughes, Medical Director. 30 Day review completed. Medical Director ITP review done, changes made as directed, and signed approval by Medical Director.    new to program First full day of exercise!  Patient was oriented to gym and equipment including functions, settings, policies, and procedures.  Patient's individual exercise prescription and treatment plan were reviewed.  All starting workloads were established based on the results of the 6 minute walk test done at initial orientation visit.  The plan for exercise progression was also introduced and progression will be customized based on patient's performance and goals. 30 Day review completed. Medical Director ITP review done, changes made as directed, and signed approval by Medical Director.    new to program    Row Name 07/16/23 1027 08/13/23 0821 09/10/23 1318       ITP Comments 30 Day review completed. Medical Director ITP review done, changes made as directed, and signed approval by Medical Director. 30 Day review completed. Medical Director ITP review done, changes  made as directed, and signed approval by Medical Director. 30 Day review completed. Medical Director ITP review done, changes made as directed, and signed approval by Medical Director.    out medical reason  since 4/7              Comments:

## 2023-09-11 ENCOUNTER — Ambulatory Visit

## 2023-09-15 ENCOUNTER — Ambulatory Visit

## 2023-09-17 ENCOUNTER — Ambulatory Visit

## 2023-09-18 ENCOUNTER — Encounter: Payer: Medicare Other | Admitting: Physical Therapy

## 2023-09-18 ENCOUNTER — Ambulatory Visit

## 2023-09-18 NOTE — Addendum Note (Signed)
 Addended by: Lott Rouleau A on: 09/18/2023 09:27 AM   Modules accepted: Orders

## 2023-09-18 NOTE — Progress Notes (Signed)
 Remote pacemaker transmission.

## 2023-09-22 ENCOUNTER — Ambulatory Visit

## 2023-09-22 DIAGNOSIS — H43813 Vitreous degeneration, bilateral: Secondary | ICD-10-CM | POA: Diagnosis not present

## 2023-09-22 DIAGNOSIS — H353132 Nonexudative age-related macular degeneration, bilateral, intermediate dry stage: Secondary | ICD-10-CM | POA: Diagnosis not present

## 2023-09-22 DIAGNOSIS — H2513 Age-related nuclear cataract, bilateral: Secondary | ICD-10-CM | POA: Diagnosis not present

## 2023-09-23 ENCOUNTER — Telehealth: Payer: Self-pay

## 2023-09-23 DIAGNOSIS — L909 Atrophic disorder of skin, unspecified: Secondary | ICD-10-CM | POA: Diagnosis not present

## 2023-09-23 DIAGNOSIS — M79672 Pain in left foot: Secondary | ICD-10-CM | POA: Diagnosis not present

## 2023-09-23 NOTE — Telephone Encounter (Signed)
 We attempted to call pt today as he has not attended rehab since 08/11/2023. There was no answer. We left a message requesting that he call us  back to give us  an update on his current status.

## 2023-09-24 ENCOUNTER — Ambulatory Visit

## 2023-09-24 DIAGNOSIS — G4733 Obstructive sleep apnea (adult) (pediatric): Secondary | ICD-10-CM | POA: Diagnosis not present

## 2023-09-24 DIAGNOSIS — G4721 Circadian rhythm sleep disorder, delayed sleep phase type: Secondary | ICD-10-CM | POA: Diagnosis not present

## 2023-09-25 ENCOUNTER — Ambulatory Visit

## 2023-10-01 ENCOUNTER — Ambulatory Visit

## 2023-10-01 DIAGNOSIS — Z85528 Personal history of other malignant neoplasm of kidney: Secondary | ICD-10-CM | POA: Diagnosis not present

## 2023-10-01 DIAGNOSIS — G4733 Obstructive sleep apnea (adult) (pediatric): Secondary | ICD-10-CM | POA: Diagnosis not present

## 2023-10-01 DIAGNOSIS — Z8619 Personal history of other infectious and parasitic diseases: Secondary | ICD-10-CM | POA: Diagnosis not present

## 2023-10-01 DIAGNOSIS — R42 Dizziness and giddiness: Secondary | ICD-10-CM | POA: Diagnosis not present

## 2023-10-01 DIAGNOSIS — R079 Chest pain, unspecified: Secondary | ICD-10-CM | POA: Diagnosis not present

## 2023-10-02 ENCOUNTER — Ambulatory Visit

## 2023-10-03 ENCOUNTER — Other Ambulatory Visit: Payer: Self-pay | Admitting: Urology

## 2023-10-03 ENCOUNTER — Encounter: Payer: Self-pay | Admitting: Nurse Practitioner

## 2023-10-03 ENCOUNTER — Ambulatory Visit: Attending: Nurse Practitioner | Admitting: Nurse Practitioner

## 2023-10-03 VITALS — BP 118/74 | HR 61 | Ht 65.0 in | Wt 153.4 lb

## 2023-10-03 DIAGNOSIS — I351 Nonrheumatic aortic (valve) insufficiency: Secondary | ICD-10-CM

## 2023-10-03 DIAGNOSIS — I340 Nonrheumatic mitral (valve) insufficiency: Secondary | ICD-10-CM

## 2023-10-03 DIAGNOSIS — I25110 Atherosclerotic heart disease of native coronary artery with unstable angina pectoris: Secondary | ICD-10-CM | POA: Diagnosis not present

## 2023-10-03 DIAGNOSIS — I428 Other cardiomyopathies: Secondary | ICD-10-CM

## 2023-10-03 DIAGNOSIS — N183 Chronic kidney disease, stage 3 unspecified: Secondary | ICD-10-CM | POA: Diagnosis not present

## 2023-10-03 DIAGNOSIS — E782 Mixed hyperlipidemia: Secondary | ICD-10-CM | POA: Diagnosis not present

## 2023-10-03 DIAGNOSIS — I480 Paroxysmal atrial fibrillation: Secondary | ICD-10-CM | POA: Diagnosis not present

## 2023-10-03 DIAGNOSIS — I5022 Chronic systolic (congestive) heart failure: Secondary | ICD-10-CM | POA: Diagnosis not present

## 2023-10-03 DIAGNOSIS — I200 Unstable angina: Secondary | ICD-10-CM

## 2023-10-03 DIAGNOSIS — E291 Testicular hypofunction: Secondary | ICD-10-CM

## 2023-10-03 MED ORDER — ISOSORBIDE MONONITRATE ER 30 MG PO TB24: 15.0000 mg | ORAL_TABLET | Freq: Every day | ORAL | 3 refills | Status: DC

## 2023-10-03 MED ORDER — NITROGLYCERIN 0.4 MG SL SUBL: 0.4000 mg | SUBLINGUAL_TABLET | SUBLINGUAL | 3 refills | Status: AC | PRN

## 2023-10-03 NOTE — Progress Notes (Signed)
 Office Visit    Patient Name: Thomas [name] Date of Encounter: 10/03/2023  Primary Care Provider:  System, Provider Not In Primary Cardiologist:  Antionette Kirks, MD  Chief Complaint    89 y.o. male with a history of nonobstructive CAD, complete heart block, nonischemic cardiomyopathy in the setting of prolonged RV pacing status post CRT-P (2024), chronic heart failure with midrange ejection fraction, moderate MR/AI, hypertension, hyperlipidemia, paroxysmal atrial fibrillation and flutter on Tikosyn  and Eliquis , TIA/stroke, and CKD III status post partial nephrectomy due to renal cell carcinoma, who presents for unstable angina.  Past Medical History   Subjective   Past Medical History:  Diagnosis Date   Aortic insufficiency    a. 07/2023 Echo: Mod AI.   Aortic valve disorders    Arthritis    Basal cell carcinoma    "face, nose left shoulder, left arm" (06/19/2016)   Basal cell carcinoma 09/13/2020   right temple   BBB (bundle branch block)    hx right   CAD (coronary artery disease)    a. 10/2017 Cath: mod, nonobs dzs; b. 02/2021 Cath: mod, nonobs dzs; c. 11/2022 Cath: LAD 30ost/p, 40m, D1 min irregs, LCX 65p/m, OM1 min irregs, OM2 40, OM3 nl, RCA 30p, 65m, 40d w/ 40 in side branch-->Med rx.   Chronic back pain    "neck, thoracic, lower back" (06/19/2016)   CKD (chronic kidney disease), stage III (HCC)    Complete heart block (HCC) 06/2016   a. 06/2016 s/p MDT PPM; b. 01/2023 s/p upgrade to MDT CRT-P (ser # [id number]).   GERD (gastroesophageal reflux disease)    Gout    Heart block    "I've had type I, II Wenke before now" (06/19/2016)   History of gout    History of hiatal hernia    "self dx'd" (06/19/2016)   Hyperlipidemia    Hypertension    Lyme disease    "dx'd by me 2003; cx's showed dx 08/2015"   Migraine    "3-4/year" (06/19/2016)   Mitral regurgitation    a. 07/2023 Echo: Mod MR.   NICM (nonischemic cardiomyopathy - in setting of RV pacing) (HCC)    a. 02/2019  Echo: EF 45-50%; b. 10/2022 Echo: EF 30-35%; c. 01/2023 CRT-P upgrade; e. 07/2023 Echo: EF 40-45%, glob HK, GrI DD, nl RV size/fxn, RVSP 42.2 mmHg, mildly dil LA, mod MR, mod AI.   PAF (paroxysmal atrial fibrillation) (HCC)    a. CHA2DS2VASc = 7--> eliquis ; b. On tikosyn .   Paroxysmal atrial flutter (HCC)    Presence of permanent cardiac pacemaker    PVC's (premature ventricular contractions)    Renal cancer, left (HCC) 2006   S/P cryotherapy   Spinal stenosis    "cervical, 1 thoracic, lumbar" (06/19/2016)   Squamous carcinoma    "face, nose left shoulder, left arm" (06/19/2016)   Stroke (HCC)    TIA (transient ischemic attack) 06/14/2016   "I'm not sure that's what it was" (06/25/2016)   Visit for monitoring Tikosyn  therapy 09/09/2017   Past Surgical History:  Procedure Laterality Date   ANKLE FRACTURE SURGERY Right 1967   BACK SURGERY  05/07/2020   BASAL CELL CARCINOMA EXCISION     "face, nose left shoulder, left arm"   BIOPSY PROSTATE  2001 & 2003   BIV UPGRADE N/A 01/10/2023   Procedure: BIV UPGRADE;  Surgeon: Verona Goodwill, MD;  Location: [facility] INVASIVE CV LAB;  Service: Cardiovascular;  Laterality: N/A;   CARDIAC CATHETERIZATION  1990's   CARDIOVERSION  N/A 09/11/2017   Procedure: CARDIOVERSION;  Surgeon: Lenise Quince, MD;  Location: [facility] ENDOSCOPY;  Service: Cardiovascular;  Laterality: N/A;   FRACTURE SURGERY     HOLEP-LASER ENUCLEATION OF THE PROSTATE WITH MORCELLATION N/A 07/10/2020   Procedure: HOLEP-LASER ENUCLEATION OF THE PROSTATE WITH MORCELLATION;  Surgeon: Dustin Gimenez, MD;  Location: [facility] ORS;  Service: Urology;  Laterality: N/A;   INGUINAL HERNIA REPAIR Left 2012   INSERT / REPLACE / REMOVE PACEMAKER  06/19/2016   LAPAROSCOPIC ABLATION RENAL MASS     LEFT HEART CATH AND CORONARY ANGIOGRAPHY Left 10/23/2017   Procedure: LEFT HEART CATH AND CORONARY ANGIOGRAPHY;  Surgeon: Wenona Hamilton, MD;  Location: [facility] INVASIVE CV LAB;  Service: Cardiovascular;  Laterality: Left;    LEFT HEART CATH AND CORONARY ANGIOGRAPHY Left 02/26/2021   Procedure: LEFT HEART CATH AND CORONARY ANGIOGRAPHY;  Surgeon: Wenona Hamilton, MD;  Location: [facility] INVASIVE CV LAB;  Service: Cardiovascular;  Laterality: Left;   PACEMAKER IMPLANT N/A 06/19/2016   Procedure: Pacemaker Implant;  Surgeon: Verona Goodwill, MD;  Location: [facility] INVASIVE CV LAB;  Service: Cardiovascular;  Laterality: N/A;   pacemasker     PROSTATE SURGERY     RIGHT/LEFT HEART CATH AND CORONARY ANGIOGRAPHY Bilateral 11/25/2022   Procedure: RIGHT/LEFT HEART CATH AND CORONARY ANGIOGRAPHY;  Surgeon: Wenona Hamilton, MD;  Location: [facility] INVASIVE CV LAB;  Service: Cardiovascular;  Laterality: Bilateral;   SQUAMOUS CELL CARCINOMA EXCISION     "face, nose left shoulder, left arm"   TONSILLECTOMY AND ADENOIDECTOMY      Allergies  Allergies  Allergen Reactions   Other Other (See Comments)    Vicryl sutures - patient states that site becomes "soupy"   Iodinated Contrast Media Hives   Iodine Hives and Other (See Comments)    IVP contrast   Penicillins Itching, Rash and Other (See Comments)    ITCHY FEELING IN FINGERS; Tolerated Zosyn  04/2023       History of Present Illness      89 y.o. y/o male with the above past medical history including nonobstructive CAD, complete heart block, nonischemic cardiomyopathy, chronic heart failure with midrange ejection fraction, moderate MR/AI, hypertension, hyperlipidemia, paroxysmal atrial fibrillation and flutter, TIA/stroke, and CKD 3 status post partial nephrectomy in the setting of left renal cell carcinoma.  In the setting of admission for stroke, he was noted to have atrial fibrillation and complete heart block.  He underwent permanent pacemaker placement in February 2018.  He was also diagnosed with atrial fibrillation and subsequently underwent Tikosyn  loading.  Has been chronically coagulated with Eliquis .  Coronary CT angiogram in May 2019 showed a calcium  score of 1366 with  possible significant LAD disease.  He underwent diagnostic catheterization which showed moderate, nonobstructive three-vessel disease and medical therapy was recommended.  In June 2024, he was noted to have a drop in EF to 30-35% with global hypokinesis.  This was down from 45-50% in 2021.  He also complained of exertional chest discomfort.  He underwent diagnostic catheterization in July 2024 which showed stable three-vessel CAD with moderately calcified coronary arteries.  Right heart cath showed normal filling pressures and normal cardiac output.  Medical therapy was recommended with no FFR of the RCA and circumflex with possible PCI could be considered in the future for refractory angina.  In the interim, he was referred back to electrophysiology in the setting of persistent RV pacing and new cardiomyopathy.  He underwent CRT-P in September 2024.  Follow-up echocardiogram in March 2025 showed  some improvement in EF to 40-45% with RVSP 42.2 mmHg, moderate MR and moderate AI.   Over the past month, Mr. Davidow has noted that when walking his dog, after about 1/4 mile, he might develop midsternal chest burning that sometimes involves his left arm.  He does not think that he has dyspnea though his wife has noted him breathing heavier recently.  He continues to walk, without slowing his pace, and symptoms usually resolve, sometimes after coughing up some mucus.  He saw his PCP and was referred here.  He denies c/p at this time and has not had any rest angina.  He has noted a general decline in his exercise tolerance over the past few years, but doesn't think it's acutely worsened.  He denies palpitations, pnd, orthopnea, n, v, dizziness, syncope, edema, weight gain, or early satiety.  Objective   Home Medications    Current Outpatient Medications  Medication Sig Dispense Refill   acetaminophen  (TYLENOL ) 500 MG tablet Take 500-1,000 mg by mouth every 6 (six) hours as needed for mild pain (pain score 1-3)  or fever.     apixaban  (ELIQUIS ) 2.5 MG TABS tablet Take 1 tablet (2.5 mg total) by mouth 2 (two) times daily. 90 tablet 3   Artificial Tears ophthalmic solution Place 1 drop into both eyes in the morning, at noon, in the evening, and at bedtime.     atorvastatin  (LIPITOR) 40 MG tablet Take 1 tablet (40 mg total) by mouth daily. 90 tablet 3   buPROPion  (WELLBUTRIN  XL) 150 MG 24 hr tablet Take 150 mg by mouth daily.     calcium  carbonate (TUMS - DOSED IN MG ELEMENTAL CALCIUM ) 500 MG chewable tablet Chew 1-2 tablets by mouth daily as needed for indigestion.     Coenzyme Q10 (COQ10) 100 MG CAPS Take 100 mg by mouth daily.     colchicine  0.6 MG tablet Take 0.6 mg by mouth daily as needed (for gout flares).     docusate sodium  (COLACE) 100 MG capsule Take 100 mg by mouth daily as needed for mild constipation.     dofetilide  (TIKOSYN ) 125 MCG capsule TAKE 1 CAPSULE BY MOUTH TWICE DAILY 180 capsule 2   DULoxetine  (CYMBALTA ) 20 MG capsule Take 20 mg by mouth 2 (two) times daily.     febuxostat  (ULORIC ) 40 MG tablet Take 1 tablet (40 mg total) by mouth daily. 90 tablet 3   gabapentin  (NEURONTIN ) 300 MG capsule Take 300 mg by mouth at bedtime.     hydrALAZINE  (APRESOLINE ) 25 MG tablet Take 1 tablet (25 mg total) by mouth as needed (for a Systolic reading of 160 that will not decrease after 30 minutes rest.). 30 tablet 0   isosorbide mononitrate (IMDUR) 30 MG 24 hr tablet Take 0.5 tablets (15 mg total) by mouth daily. 30 tablet 3   methocarbamol  (ROBAXIN ) 500 MG tablet Take 500 mg by mouth every 6 (six) hours as needed for muscle spasms.     metoprolol  succinate (TOPROL -XL) 50 MG 24 hr tablet TAKE 1 TABLET BY MOUTH DAILY WITH OR IMMEDIATELY FOLLOWING A MEAL (Patient taking differently: Take 50 mg by mouth at bedtime.) 90 tablet 3   metroNIDAZOLE  (FLAGYL ) 500 MG tablet TAKE 1 TABLET BY MOUTH TWICE DAILY ON SUNDAY AND MONDAY. (Patient taking differently: Take 1,000 mg by mouth See admin instructions. Take 1,000  mg by mouth on Saturday and Sunday- EVERY OTHER WEEKEND) 90 tablet 3   NEEDLE, DISP, 18 G (BD DISP NEEDLES) 18G X 1-1/2" MISC  1 mg by Does not apply route every 14 (fourteen) days. 50 each 0   NEEDLE, DISP, 21 G (BD DISP NEEDLES) 21G X 1-1/2" MISC 1 mg by Does not apply route every 14 (fourteen) days. 50 each 0   nitroGLYCERIN  (NITROSTAT ) 0.4 MG SL tablet Place 1 tablet (0.4 mg total) under the tongue every 5 (five) minutes as needed for chest pain. 25 tablet 3   omeprazole (PRILOSEC) 20 MG capsule Take 20 mg by mouth daily as needed (for heartburn- 30 minutes before a meal).     sacubitril -valsartan  (ENTRESTO ) 24-26 MG Take 1 tablet by mouth 2 (two) times daily. 180 tablet 3   Syringe, Disposable, (2-3CC SYRINGE) 3 ML MISC 1 mg by Does not apply route every 14 (fourteen) days. 25 each 3   terbinafine  (LAMISIL ) 250 MG tablet Take 1 tablet (250 mg total) by mouth daily. 30 tablet 1   testosterone  cypionate (DEPOTESTOSTERONE CYPIONATE) 200 MG/ML injection Inject 1 mL (200 mg total) into the muscle every 28 (twenty-eight) days. 10 mL 0   Testosterone  1.62 % GEL APPLY 2 PUMPS DAILY 75 g 3   No current facility-administered medications for this visit.     Physical Exam    VS:  BP 118/74   Pulse 61   Ht 5\' 5"  (1.651 m)   Wt 153 lb 6.4 oz (69.6 kg)   SpO2 96%   BMI 25.53 kg/m  , BMI Body mass index is 25.53 kg/m.       GEN: Well nourished, well developed, in no acute distress. HEENT: normal. Neck: Supple, no JVD, carotid bruits, or masses. Cardiac: RRR, no murmurs, rubs, or gallops. No clubbing, cyanosis, edema.  Radials 2+/PT 2+ and equal bilaterally.  Respiratory:  Respirations regular and unlabored, clear to auscultation bilaterally. GI: Soft, nontender, nondistended, BS + x 4. MS: no deformity or atrophy. Skin: warm and dry, no rash. Neuro:  Strength and sensation are intact. Psych: Normal affect.  Accessory Clinical Findings    ECG personally reviewed by me today - EKG  Interpretation Date/Time:  Friday Oct 03 2023 11:34:36 EDT Ventricular Rate:  61 PR Interval:  178 QRS Duration:  152 QT Interval:  474 QTC Calculation: 477 R Axis:   -24  Text Interpretation: AV dual-paced rhythm Confirmed by Laneta Pintos [phone]) on 10/03/2023 11:54:57 AM  - no acute changes.  Lab Results  Component Value Date   WBC 6.3 05/23/2023   HGB 14.1 05/23/2023   HCT 43.4 05/23/2023   MCV 87.5 05/23/2023   PLT 221 05/23/2023   Lab Results  Component Value Date   CREATININE 1.83 (H) 05/23/2023   BUN 34 (H) 05/23/2023   NA 141 05/23/2023   K 4.5 05/23/2023   CL 103 05/23/2023   CO2 31 05/23/2023   Lab Results  Component Value Date   ALT 13 05/23/2023   AST 17 05/23/2023   ALKPHOS 55 04/29/2023   BILITOT 0.6 05/23/2023   Lab Results  Component Value Date   CHOL 133 01/09/2022   HDL 67 01/09/2022   LDLCALC 56 01/09/2022   TRIG 39 01/09/2022   CHOLHDL 2.0 01/09/2022    Lab Results  Component Value Date   HGBA1C 5.7 (H) 01/09/2022   Lab Results  Component Value Date   TSH 2.420 01/09/2022       Assessment & Plan    1.  [place] /CAD: Patient with prior history of moderate, nonobstructive CAD.  Most recent catheterization in July 2024 showed stable anatomy with 65%  proximal mid circumflex disease and 70% mid RCA disease.  He has since been medically managed with recommendation for consideration of repeat catheterization and FFR measurement for current angina.  Over the past month, patient has been having intermittent exertional chest burning sometimes radiating to his left arm associated with fatigue, lasting about 5 minutes, resolving spontaneously.  He does not have to stop walking for symptoms to resolve and sometimes, symptoms resolve simply by coughing up some mucus.  He has not had any fevers or chills.  He has not had any rest angina.  Prolonged discussion today about his symptoms which are concerning for angina and potential progression of disease.   After much discussion, we mutually agreed to an initial trial of medical therapy.  I have added isosorbide mononitrate 15 mg daily and also provided a prescription for sublingual nitroglycerin  and provided details for use.  We will plan to follow-up in 3 to 4 weeks to reevaluate symptoms and determine if diagnostic catheterization is necessary.  He does have a contrast allergy and also CKD 3-4 with a creatinine of 1.83 earlier this year.  He otherwise remains on statin and beta-blocker therapy.  2.  Nonischemic cardiomyopathy/heart failure with midrange ejection fraction: Patient with prior drop in EF to 30 to 35% in June 2024 in the setting of persistent RV pacing.  He subsequently underwent CRT-P with improvement in EF to 40-45% by echo in March 2025.  As noted above, he has noted some decline in exercise tolerance over several years.  He is euvolemic on examination.  He remains on beta-blocker, Entresto , and hydralazine .  I am adding long-acting nitrate today in the setting of angina.  3.  Primary hypertension: Blood pressure stable at 118/74 today.  He notes that he is fairly labile.  Adding low-dose nitrate in the setting interval 1, he will continue to follow pressures at home.  Continue beta-blocker, hydralazine , and Entresto .  4.  Hyperlipidemia: LDL of 56 in September 2023.  This is typically followed at the [place] and we do not have most recent labs available.  He remains on atorvastatin  therapy.  5.  Paroxysmal atrial fibrillation and flutter: AV paced today.  He remains on Tikosyn  and is anticoagulated with Eliquis  2.5 mg twice daily.  6.  Complete heart block: Status post pacemaker with CRT-P upgrade in 2024.  AV paced today.  7.  Stage III-IV chronic kidney disease: Followed at the [place].  He is on Entresto .  If he ends up needing diagnostic catheterization, he will need follow-up lab work as well as hydration precath.  8.  Moderate mitral regurgitation and aortic insufficiency: Noted on most  recent echo.  I do not appreciate any murmurs on examination the heart sounds are distant.  Plan for surveillance echo in the future.  9.  Disposition: Follow-up in 3 to 4 weeks or sooner if necessary.  Laneta Pintos, NP 10/03/2023, 12:38 PM

## 2023-10-03 NOTE — Patient Instructions (Signed)
 Medication Instructions:  Your physician recommends the following medication changes.  START TAKING: Isosorbide mononitrate 30 mg daily Nitroglycerin  sublingual (under tongue)   *If you need a refill on your cardiac medications before your next appointment, please call your pharmacy*  Lab Work: No labs ordered today  If you have labs (blood work) drawn today and your tests are completely normal, you will receive your results only by: MyChart Message (if you have MyChart) OR A paper copy in the mail If you have any lab test that is abnormal or we need to change your treatment, we will call you to review the results.  Testing/Procedures: No test ordered today   Follow-Up: At [facility], you and your health needs are our priority.  As part of our continuing mission to provide you with exceptional heart care, our providers are all part of one team.  This team includes your primary Cardiologist (physician) and Advanced Practice Providers or APPs (Physician Assistants and Nurse Practitioners) who all work together to provide you with the care you need, when you need it.  Your next appointment:   4 week(s)  Provider:   You may see Antionette Kirks, MD or one of the following Advanced Practice Providers on your designated Care Team:   Laneta Pintos, NP  We recommend signing up for the patient portal called "MyChart".  Sign up information is provided on this After Visit Summary.  MyChart is used to connect with patients for Virtual Visits (Telemedicine).  Patients are able to view lab/test results, encounter notes, upcoming appointments, etc.  Non-urgent messages can be sent to your provider as well.   To learn more about what you can do with MyChart, go to [email or web address].   Other Instructions The proper use and anticipated side effects of nitroglycerin  has been carefully explained.  If a single episode of chest pain is not relieved by one tablet, the patient will try  another within 5 minutes; and if this doesn't relieve the pain, the patient is instructed to call 911 for transportation to an emergency department.

## 2023-10-06 ENCOUNTER — Ambulatory Visit

## 2023-10-07 ENCOUNTER — Ambulatory Visit: Admitting: Dermatology

## 2023-10-08 ENCOUNTER — Other Ambulatory Visit: Payer: Self-pay | Admitting: Urology

## 2023-10-08 ENCOUNTER — Encounter: Payer: Self-pay | Admitting: *Deleted

## 2023-10-08 DIAGNOSIS — I5022 Chronic systolic (congestive) heart failure: Secondary | ICD-10-CM

## 2023-10-08 NOTE — Progress Notes (Signed)
 Cardiac Individual Treatment Plan  Patient Details  Name: Thomas [name] MRN: [id number] Date of Birth: [birth year] Referring Provider:   Flowsheet Row Cardiac Rehab from 05/19/2023 in [facility] Cardiac and Pulmonary Rehab  Referring Provider Dr. Antionette Kirks       Initial Encounter Date:  Flowsheet Row Cardiac Rehab from 05/19/2023 in [facility] Cardiac and Pulmonary Rehab  Date 05/19/23       Visit Diagnosis: Heart failure, chronic systolic (HCC)  Patient's Home Medications on Admission:  Current Outpatient Medications:    acetaminophen  (TYLENOL ) 500 MG tablet, Take 500-1,000 mg by mouth every 6 (six) hours as needed for mild pain (pain score 1-3) or fever., Disp: , Rfl:    apixaban  (ELIQUIS ) 2.5 MG TABS tablet, Take 1 tablet (2.5 mg total) by mouth 2 (two) times daily., Disp: 90 tablet, Rfl: 3   Artificial Tears ophthalmic solution, Place 1 drop into both eyes in the morning, at noon, in the evening, and at bedtime., Disp: , Rfl:    atorvastatin  (LIPITOR) 40 MG tablet, Take 1 tablet (40 mg total) by mouth daily., Disp: 90 tablet, Rfl: 3   buPROPion  (WELLBUTRIN  XL) 150 MG 24 hr tablet, Take 150 mg by mouth daily., Disp: , Rfl:    calcium  carbonate (TUMS - DOSED IN MG ELEMENTAL CALCIUM ) 500 MG chewable tablet, Chew 1-2 tablets by mouth daily as needed for indigestion., Disp: , Rfl:    Coenzyme Q10 (COQ10) 100 MG CAPS, Take 100 mg by mouth daily., Disp: , Rfl:    colchicine  0.6 MG tablet, Take 0.6 mg by mouth daily as needed (for gout flares)., Disp: , Rfl:    docusate sodium  (COLACE) 100 MG capsule, Take 100 mg by mouth daily as needed for mild constipation., Disp: , Rfl:    dofetilide  (TIKOSYN ) 125 MCG capsule, TAKE 1 CAPSULE BY MOUTH TWICE DAILY, Disp: 180 capsule, Rfl: 2   DULoxetine  (CYMBALTA ) 20 MG capsule, Take 20 mg by mouth 2 (two) times daily., Disp: , Rfl:    febuxostat  (ULORIC ) 40 MG tablet, Take 1 tablet (40 mg total) by mouth daily., Disp: 90 tablet, Rfl: 3   gabapentin   (NEURONTIN ) 300 MG capsule, Take 300 mg by mouth at bedtime., Disp: , Rfl:    hydrALAZINE  (APRESOLINE ) 25 MG tablet, Take 1 tablet (25 mg total) by mouth as needed (for a Systolic reading of 160 that will not decrease after 30 minutes rest.)., Disp: 30 tablet, Rfl: 0   isosorbide  mononitrate (IMDUR ) 30 MG 24 hr tablet, Take 0.5 tablets (15 mg total) by mouth daily., Disp: 30 tablet, Rfl: 3   methocarbamol  (ROBAXIN ) 500 MG tablet, Take 500 mg by mouth every 6 (six) hours as needed for muscle spasms., Disp: , Rfl:    metoprolol  succinate (TOPROL -XL) 50 MG 24 hr tablet, TAKE 1 TABLET BY MOUTH DAILY WITH OR IMMEDIATELY FOLLOWING A MEAL (Patient taking differently: Take 50 mg by mouth at bedtime.), Disp: 90 tablet, Rfl: 3   metroNIDAZOLE  (FLAGYL ) 500 MG tablet, TAKE 1 TABLET BY MOUTH TWICE DAILY ON SUNDAY AND MONDAY. (Patient taking differently: Take 1,000 mg by mouth See admin instructions. Take 1,000 mg by mouth on Saturday and Sunday- EVERY OTHER WEEKEND), Disp: 90 tablet, Rfl: 3   NEEDLE, DISP, 18 G (BD DISP NEEDLES) 18G X 1-1/2" MISC, 1 mg by Does not apply route every 14 (fourteen) days., Disp: 50 each, Rfl: 0   NEEDLE, DISP, 21 G (BD DISP NEEDLES) 21G X 1-1/2" MISC, 1 mg by Does not apply route every  14 (fourteen) days., Disp: 50 each, Rfl: 0   nitroGLYCERIN  (NITROSTAT ) 0.4 MG SL tablet, Place 1 tablet (0.4 mg total) under the tongue every 5 (five) minutes as needed for chest pain., Disp: 25 tablet, Rfl: 3   omeprazole (PRILOSEC) 20 MG capsule, Take 20 mg by mouth daily as needed (for heartburn- 30 minutes before a meal)., Disp: , Rfl:    sacubitril -valsartan  (ENTRESTO ) 24-26 MG, Take 1 tablet by mouth 2 (two) times daily., Disp: 180 tablet, Rfl: 3   Syringe, Disposable, (2-3CC SYRINGE) 3 ML MISC, 1 mg by Does not apply route every 14 (fourteen) days., Disp: 25 each, Rfl: 3   terbinafine  (LAMISIL ) 250 MG tablet, Take 1 tablet (250 mg total) by mouth daily., Disp: 30 tablet, Rfl: 1   Testosterone  1.62  % GEL, APPLY 2 PUMPS DAILY, Disp: 75 g, Rfl: 3   testosterone  cypionate (DEPOTESTOSTERONE CYPIONATE) 200 MG/ML injection, Inject 1 mL (200 mg total) into the muscle every 28 (twenty-eight) days., Disp: 10 mL, Rfl: 0  Past Medical History: Past Medical History:  Diagnosis Date   Aortic insufficiency    a. 07/2023 Echo: Mod AI.   Aortic valve disorders    Arthritis    Basal cell carcinoma    "face, nose left shoulder, left arm" (06/19/2016)   Basal cell carcinoma 09/13/2020   right temple   BBB (bundle branch block)    hx right   CAD (coronary artery disease)    a. 10/2017 Cath: mod, nonobs dzs; b. 02/2021 Cath: mod, nonobs dzs; c. 11/2022 Cath: LAD 30ost/p, 72m, D1 min irregs, LCX 65p/m, OM1 min irregs, OM2 40, OM3 nl, RCA 30p, 39m, 40d w/ 40 in side branch-->Med rx.   Chronic back pain    "neck, thoracic, lower back" (06/19/2016)   CKD (chronic kidney disease), stage III (HCC)    Complete heart block (HCC) 06/2016   a. 06/2016 s/p MDT PPM; b. 01/2023 s/p upgrade to MDT CRT-P (ser # [id number]).   GERD (gastroesophageal reflux disease)    Gout    Heart block    "I've had type I, II Wenke before now" (06/19/2016)   History of gout    History of hiatal hernia    "self dx'd" (06/19/2016)   Hyperlipidemia    Hypertension    Lyme disease    "dx'd by me 2003; cx's showed dx 08/2015"   Migraine    "3-4/year" (06/19/2016)   Mitral regurgitation    a. 07/2023 Echo: Mod MR.   NICM (nonischemic cardiomyopathy - in setting of RV pacing) (HCC)    a. 02/2019 Echo: EF 45-50%; b. 10/2022 Echo: EF 30-35%; c. 01/2023 CRT-P upgrade; e. 07/2023 Echo: EF 40-45%, glob HK, GrI DD, nl RV size/fxn, RVSP 42.2 mmHg, mildly dil LA, mod MR, mod AI.   PAF (paroxysmal atrial fibrillation) (HCC)    a. CHA2DS2VASc = 7--> eliquis ; b. On tikosyn .   Paroxysmal atrial flutter (HCC)    Presence of permanent cardiac pacemaker    PVC's (premature ventricular contractions)    Renal cancer, left (HCC) 2006   S/P cryotherapy    Spinal stenosis    "cervical, 1 thoracic, lumbar" (06/19/2016)   Squamous carcinoma    "face, nose left shoulder, left arm" (06/19/2016)   Stroke (HCC)    TIA (transient ischemic attack) 06/14/2016   "I'm not sure that's what it was" (06/25/2016)   Visit for monitoring Tikosyn  therapy 09/09/2017    Tobacco Use: Social History   Tobacco Use  Smoking Status Never  Smokeless Tobacco Never    Labs: Review Flowsheet  More data exists      Latest Ref Rng & Units 02/07/2020 11/01/2020 08/21/2021 01/09/2022 11/25/2022  Labs for ITP Cardiac and Pulmonary Rehab  Cholestrol 100 - 199 mg/dL 130  865  - 784  -  LDL (calc) 0 - 99 mg/dL 76  70  - 56  -  HDL-C >39 mg/dL 53  41  - 67  -  Trlycerides 0 - 149 mg/dL 47  69  - 39  -  Hemoglobin A1c 4.8 - 5.6 % - - 5.7  5.7  -  PH, Arterial 7.35 - 7.45 - - - - 7.376   PCO2 arterial 32 - 48 mmHg - - - - 41.7   Bicarbonate 20.0 - 28.0 mmol/L - - - - 25.5  24.4   TCO2 22 - 32 mmol/L - - - - 27  26   Acid-base deficit 0.0 - 2.0 mmol/L - - - - 1.0   O2 Saturation % - - - - 70  88     Details       Multiple values from one day are sorted in reverse-chronological order          Exercise Target Goals: Exercise Program Goal: Individual exercise prescription set using results from initial 6 min walk test and THRR while considering  patient's activity barriers and safety.   Exercise Prescription Goal: Initial exercise prescription builds to 30-45 minutes a day of aerobic activity, 2-3 days per week.  Home exercise guidelines will be given to patient during program as part of exercise prescription that the participant will acknowledge.   Education: Aerobic Exercise: - Group verbal and visual presentation on the components of exercise prescription. Introduces F.I.T.T principle from ACSM for exercise prescriptions.  Reviews F.I.T.T. principles of aerobic exercise including progression. Written material given at graduation.   Education: Resistance  Exercise: - Group verbal and visual presentation on the components of exercise prescription. Introduces F.I.T.T principle from ACSM for exercise prescriptions  Reviews F.I.T.T. principles of resistance exercise including progression. Written material given at graduation. Flowsheet Row Cardiac Rehab from 07/30/2023 in [facility] Cardiac and Pulmonary Rehab  Date 07/16/23  Educator [facility]  Instruction Review Code 1- [employer] Understanding        Education: Exercise & Equipment Safety: - Individual verbal instruction and demonstration of equipment use and safety with use of the equipment. Flowsheet Row Cardiac Rehab from 07/30/2023 in [facility] Cardiac and Pulmonary Rehab  Date 05/19/23  Educator [facility]  Instruction Review Code 1- Verbalizes Understanding       Education: Exercise Physiology & General Exercise Guidelines: - Group verbal and written instruction with models to review the exercise physiology of the cardiovascular system and associated critical values. Provides general exercise guidelines with specific guidelines to those with heart or lung disease.    Education: Flexibility, Balance, Mind/Body Relaxation: - Group verbal and visual presentation with interactive activity on the components of exercise prescription. Introduces F.I.T.T principle from ACSM for exercise prescriptions. Reviews F.I.T.T. principles of flexibility and balance exercise training including progression. Also discusses the mind body connection.  Reviews various relaxation techniques to help reduce and manage stress (i.e. Deep breathing, progressive muscle relaxation, and visualization). Balance handout provided to take home. Written material given at graduation. Flowsheet Row Cardiac Rehab from 07/30/2023 in [facility] Cardiac and Pulmonary Rehab  Date 07/16/23  Educator [facility]  Instruction Review Code 1- Verbalizes Understanding       Activity Barriers & Risk  Stratification:  Activity Barriers & Cardiac Risk Stratification -  05/19/23 1526       Activity Barriers & Cardiac Risk Stratification   Activity Barriers Back Problems;Muscular Weakness    Cardiac Risk Stratification High             6 Minute Walk:  6 Minute Walk     Row Name 05/19/23 1525 08/07/23 1401       6 Minute Walk   Phase Initial Discharge    Distance 1360 feet 1390 feet    Distance % Change -- 2.2 %    Distance Feet Change -- 30 ft    Walk Time 6 minutes 6 minutes    # of Rest Breaks 0 0    MPH 2.58 2.63    METS 2.34 2.16    RPE 11 11    Perceived Dyspnea  0 0    VO2 Peak 8.21 7.57    Symptoms No No    Resting HR 84 bpm 63 bpm    Resting BP 118/78 128/66    Resting Oxygen Saturation  98 % 94 %    Exercise Oxygen Saturation  during 6 min walk 92 % 89 %    Max Ex. HR 117 bpm 97 bpm    Max Ex. BP 132/70 138/72    2 Minute Post BP 120/80 --             Oxygen Initial Assessment:   Oxygen Re-Evaluation:   Oxygen Discharge (Final Oxygen Re-Evaluation):   Initial Exercise Prescription:  Initial Exercise Prescription - 05/19/23 1500       Date of Initial Exercise RX and Referring Provider   Date 05/19/23    Referring Provider Dr. Antionette Kirks      Oxygen   Maintain Oxygen Saturation 88% or higher      Treadmill   MPH 1.8    Grade 1    Minutes 15    METs 2.63      Recumbant Bike   Level 3    RPM 50    Watts 20    Minutes 15    METs 2.34      NuStep   Level 3    SPM 80    Minutes 15    METs 2.34      REL-XR   Level 3    Speed 50    Minutes 15    METs 2.34      Biostep-RELP   Level 3    SPM 50    Minutes 15    METs 2.34      Prescription Details   Frequency (times per week) 3    Duration Progress to 30 minutes of continuous aerobic without signs/symptoms of physical distress      Intensity   THRR 40-80% of Max Heartrate 103-122    Ratings of Perceived Exertion 11-13    Perceived Dyspnea 0-4      Progression   Progression Continue to progress workloads to maintain intensity  without signs/symptoms of physical distress.      Resistance Training   Training Prescription Yes    Weight 7    Reps 10-15             Perform Capillary Blood Glucose checks as needed.  Exercise Prescription Changes:   Exercise Prescription Changes     Row Name 05/19/23 1500 06/02/23 1500 06/17/23 1500 06/30/23 1400 07/02/23 0800     Response to Exercise   Blood Pressure (Admit)  118/78 106/60 136/66 -- 152/80   Blood Pressure (Exercise) 132/70 122/56 155/66 -- 152/74   Blood Pressure (Exit) 120/80 126/60 126/64 -- 130/66   Heart Rate (Admit) 84 bpm 89 bpm 82 bpm -- 50 bpm   Heart Rate (Exercise) 117 bpm 117 bpm 123 bpm -- 128 bpm   Heart Rate (Exit) 66 bpm 67 bpm 70 bpm -- 64 bpm   Oxygen Saturation (Admit) 98 % -- -- -- --   Oxygen Saturation (Exercise) 92 % -- -- -- --   Oxygen Saturation (Exit) 99 % -- -- -- --   Rating of Perceived Exertion (Exercise) 11 13 13  -- 13   Perceived Dyspnea (Exercise) 0 -- 0 -- 0   Symptoms none none none -- none   Comments 6 MWT results first 2 exercise sessions -- -- --   Duration -- Progress to 30 minutes of  aerobic without signs/symptoms of physical distress Progress to 30 minutes of  aerobic without signs/symptoms of physical distress -- Progress to 30 minutes of  aerobic without signs/symptoms of physical distress   Intensity -- THRR unchanged THRR unchanged -- THRR unchanged     Progression   Progression -- Continue to progress workloads to maintain intensity without signs/symptoms of physical distress. Continue to progress workloads to maintain intensity without signs/symptoms of physical distress. -- Continue to progress workloads to maintain intensity without signs/symptoms of physical distress.   Average METs -- 2.38 2.8 -- 3.45     Resistance Training   Training Prescription -- Yes Yes -- Yes   Weight -- 7 7 -- 7   Reps -- 10-15 10-15 -- 10-15     Interval Training   Interval Training -- -- No -- No     Oxygen   Oxygen  -- Continuous -- -- --     Treadmill   MPH -- 2.2 2.2 -- 3   Grade -- 1 0 -- 0.5   Minutes -- 15 15 -- 15   METs -- 2.99 -- -- 3.5     NuStep   Level -- 3 -- -- --   SPM -- 80 -- -- --   Minutes -- 15 -- -- --   METs -- 2 -- -- --     REL-XR   Level -- -- 3 -- 4   Minutes -- -- 15 -- 15   METs -- -- 3.2 -- 3.9     T5 Nustep   Level -- 1 -- -- --   SPM -- 80 -- -- --   Minutes -- 15 -- -- --   METs -- 2 -- -- --     Home Exercise Plan   Plans to continue exercise at -- -- -- [facility]  Pt plans to join [employer] for aerobic equipment, Walking dog around 1 mile a day [employer]  Pt plans to join [employer] for aerobic equipment, Walking dog around 1 mile a day   Frequency -- -- -- Add 2 additional days to program exercise sessions. Add 2 additional days to program exercise sessions.   Initial Home Exercises Provided -- -- -- 06/30/23 06/30/23     Oxygen   Maintain Oxygen Saturation -- 88% or higher 88% or higher 88% or higher 88% or higher    Row Name 07/17/23 1700 07/28/23 1400 08/14/23 0800 08/28/23 1500       Response to Exercise   Blood Pressure (Admit) 120/78 122/64 128/66 136/70    Blood Pressure (Exercise) 134/64 --  138/72 --    Blood Pressure (Exit) 110/70 124/66 124/68 108/62    Heart Rate (Admit) 59 bpm 66 bpm 63 bpm 60 bpm    Heart Rate (Exercise) 121 bpm 120 bpm 122 bpm 98 bpm    Heart Rate (Exit) 73 bpm 68 bpm 66 bpm 64 bpm    Oxygen Saturation (Admit) -- -- 94 % --    Oxygen Saturation (Exercise) -- -- 89 % --    Oxygen Saturation (Exit) -- -- 89 % --    Rating of Perceived Exertion (Exercise) 13 13 13 13     Perceived Dyspnea (Exercise) -- -- 0 --    Symptoms none none none none    Duration Continue with 30 min of aerobic exercise without signs/symptoms of physical distress. Continue with 30 min of aerobic exercise without signs/symptoms of physical distress. Continue with 30 min of aerobic exercise without signs/symptoms of physical  distress. Continue with 30 min of aerobic exercise without signs/symptoms of physical distress.    Intensity THRR unchanged THRR unchanged THRR unchanged THRR unchanged      Progression   Progression Continue to progress workloads to maintain intensity without signs/symptoms of physical distress. Continue to progress workloads to maintain intensity without signs/symptoms of physical distress. Continue to progress workloads to maintain intensity without signs/symptoms of physical distress. Continue to progress workloads to maintain intensity without signs/symptoms of physical distress.    Average METs 3.28 3.62 3.88 4.23      Resistance Training   Training Prescription Yes Yes Yes Yes    Weight 7 8 8 8  lb    Reps 10-15 10-15 10-15 10-15      Interval Training   Interval Training No No No No      Treadmill   MPH 3.2 3.2 3.4 3.2    Grade 0.5 0.5 0 0    Minutes 15 15 15 15     METs 3.67 3.67 3.6 3.45      NuStep   Level 3 -- 4  T6 --    Minutes 15 -- 15 --    METs 2.6 -- 2.7 --      REL-XR   Level 4 8 5 9     Minutes 15 15 15 15     METs 4.1 5.4 6.4 5      T5 Nustep   Level 4 4 -- --    Minutes 15 15 -- --    METs 2.8 2.5 -- --      Home Exercise Plan   Plans to continue exercise at [facility]  Pt plans to join [employer] for aerobic equipment, Walking dog around 1 mile a day [employer]  Pt plans to join [employer] for aerobic equipment, Walking dog around 1 mile a day [employer] [facility]  Pt plans to join [employer] for aerobic equipment, Walking dog around 1 mile a day [employer]  Pt plans to join [employer] for aerobic equipment, Walking dog around 1 mile a day    Frequency Add 2 additional days to program exercise sessions. Add 2 additional days to program exercise sessions. Add 2 additional days to program exercise sessions. Add 2 additional days to program exercise sessions.    Initial Home Exercises Provided 06/30/23 06/30/23 06/30/23 06/30/23      Oxygen    Maintain Oxygen Saturation -- 88% or higher 88% or higher 88% or higher             Exercise Comments:   Exercise Comments  Row Name 05/21/23 1505           Exercise Comments First full day of exercise!  Patient was oriented to gym and equipment including functions, settings, policies, and procedures.  Patient's individual exercise prescription and treatment plan were reviewed.  All starting workloads were established based on the results of the 6 minute walk test done at initial orientation visit.  The plan for exercise progression was also introduced and progression will be customized based on patient's performance and goals.                Exercise Goals and Review:   Exercise Goals     Row Name 05/19/23 1530             Exercise Goals   Increase Physical Activity Yes       Intervention Provide advice, education, support and counseling about physical activity/exercise needs.;Develop an individualized exercise prescription for aerobic and resistive training based on initial evaluation findings, risk stratification, comorbidities and participant's personal goals.       Expected Outcomes Short Term: Attend rehab on a regular basis to increase amount of physical activity.;Long Term: Add in home exercise to make exercise part of routine and to increase amount of physical activity.;Long Term: Exercising regularly at least 3-5 days a week.       Increase Strength and Stamina Yes       Intervention Provide advice, education, support and counseling about physical activity/exercise needs.;Develop an individualized exercise prescription for aerobic and resistive training based on initial evaluation findings, risk stratification, comorbidities and participant's personal goals.       Expected Outcomes Short Term: Increase workloads from initial exercise prescription for resistance, speed, and METs.;Short Term: Perform resistance training exercises routinely during rehab and add  in resistance training at home;Long Term: Improve cardiorespiratory fitness, muscular endurance and strength as measured by increased METs and functional capacity ( )       Able to understand and use rate of perceived exertion (RPE) scale Yes       Intervention Provide education and explanation on how to use RPE scale       Expected Outcomes Short Term: Able to use RPE daily in rehab to express subjective intensity level;Long Term:  Able to use RPE to guide intensity level when exercising independently       Able to understand and use Dyspnea scale Yes       Intervention Provide education and explanation on how to use Dyspnea scale       Expected Outcomes Short Term: Able to use Dyspnea scale daily in rehab to express subjective sense of shortness of breath during exertion;Long Term: Able to use Dyspnea scale to guide intensity level when exercising independently       Knowledge and understanding of Target Heart Rate Range (THRR) Yes       Intervention Provide education and explanation of THRR including how the numbers were predicted and where they are located for reference       Expected Outcomes Short Term: Able to state/look up THRR;Long Term: Able to use THRR to govern intensity when exercising independently;Short Term: Able to use daily as guideline for intensity in rehab       Able to check pulse independently Yes       Intervention Provide education and demonstration on how to check pulse in carotid and radial arteries.;Review the importance of being able to check your own pulse for safety during independent exercise  Expected Outcomes Short Term: Able to explain why pulse checking is important during independent exercise;Long Term: Able to check pulse independently and accurately       Understanding of Exercise Prescription Yes       Intervention Provide education, explanation, and written materials on patient's individual exercise prescription       Expected Outcomes Short Term: Able  to explain program exercise prescription;Long Term: Able to explain home exercise prescription to exercise independently                Exercise Goals Re-Evaluation :  Exercise Goals Re-Evaluation     Row Name 05/21/23 1508 06/02/23 1558 06/11/23 1406 06/17/23 1531 06/30/23 1429     Exercise Goal Re-Evaluation   Exercise Goals Review Increase Physical Activity;Able to understand and use rate of perceived exertion (RPE) scale;Knowledge and understanding of Target Heart Rate Range (THRR);Understanding of Exercise Prescription;Increase Strength and Stamina;Able to check pulse independently Increase Physical Activity;Understanding of Exercise Prescription;Increase Strength and Stamina Increase Physical Activity;Increase Strength and Stamina;Understanding of Exercise Prescription Increase Physical Activity;Increase Strength and Stamina;Understanding of Exercise Prescription Increase Physical Activity;Able to understand and use Dyspnea scale;Understanding of Exercise Prescription;Knowledge and understanding of Target Heart Rate Range (THRR);Increase Strength and Stamina;Able to check pulse independently;Able to understand and use rate of perceived exertion (RPE) scale   Comments Reviewed RPE and dyspnea scale, THR and program prescription with pt today.  Pt voiced understanding and was given a copy of goals to take home. Antonino is off to a good start in the program. He has completed 2 exercise sessions. He did well with his exercise prescription and even increased his treadmill workload to a speed of 2. and incline of 1%. He also added T5 nustep to his exercise prescription. We will continue to monitor his progress in the program. Keymarion is doing well at rehab, feels he is out of shape but is determined to get back to where he was before the surgeries. Currently on treadmill at 1.8 speed. He is happy with the ability to workout safely while being monitored. Encouraged him to continue to workout here at  rehab and doing exercise safely when at home. Lowery is doing well in rehab. He was able to maintain his speed on the treadmill but decreased his incline from 1% to 0. He was also able to maintain a level of 3 on the XR. We will continue to encourage and monitor his progression in the program. Reviewed home exercise with pt today.  Pt plans to join the [employer] for the aerobic equipment, and also will continue to walk his dog about 1 mile a day for exercise.  Reviewed THR, pulse, RPE, sign and symptoms, pulse oximetery and when to call 911 or MD.  Also discussed weather considerations and indoor options.  Pt voiced understanding.   Expected Outcomes Short: Use RPE daily to regulate intensity.  Long: Follow program prescription in THR. Short: Continue to follow current exercise prescription. Long: Continue exercise to improve strength and stamina. STG: continue to come to rehab and increase workload as able. LTG: Continue to improve strenght and stamina Short: Continue to follow exercise prescription and increase workloads when able. Long: Continue exercise to improve strength and stamina. Short: Join [facility] for aerobic equipment and exercise. Long: Continue exercise to improve strength and stamina.    Row Name 07/02/23 3086 07/17/23 1742 07/23/23 1414 07/28/23 1447 08/14/23 0844     Exercise Goal Re-Evaluation   Exercise Goals Review Increase Physical Activity;Increase Strength and Stamina;Understanding  of Exercise Prescription Increase Physical Activity;Increase Strength and Stamina;Understanding of Exercise Prescription Increase Physical Activity;Increase Strength and Stamina;Understanding of Exercise Prescription Increase Physical Activity;Increase Strength and Stamina;Understanding of Exercise Prescription Increase Physical Activity;Increase Strength and Stamina;Understanding of Exercise Prescription   Comments Demetrion is doing well in rehab. He was able to increase his treadmill workload to a speed of  and an incline of 0.5%. He was also able to increase from level 3 to 4 on the XR. We will continue to monitor his progress in the program. Damari is doing well in rehab. He was able to increase his treadmill workload to a speed of 3.2 mph and maintain an incline of 0.5%. He was also able to maintain level 4 on the XR and increase to level 4 on the T5 nustep. We will continue to monitor his progress in the program. Rafan is walking and doing weight exercises at home fairly consistently. He is consistently getting 3-4 days a week of exercise in each week, including his days in rehab. He was encouraged to work towards 4 days a week  of exercise consistently. Exercise progression and interval training was discussed with patient. He may start incorporating interval training as he feels ready to progress to higher intensities. THRR was reviewed and patient was encouraged to look into buying a pulse ox to better monitor his heart rate a home while exercising. Tyberius continues to do well in rehab. He increased to level 8 on the XR and level 4 on the T5 nustep. He has maintained his workload on the treadmill at a speed of 3.2 mph and incline of 0.5%. He also increased his handweights to 8 lbs for resistance. We will continue to monitor his progress in the program. Phi continues to do well in rehab and will graduate very soon. He completed his post and walked 1390 ft, which is a 2.2% improvement from his initial. He increased to level 4 on the T6 nustep. He also increased his speed on the treadmill to 3.4 mph. We will continue to monitor his progress in the program.   Expected Outcomes Short: Continue to increase treadmill workload. Long: Continue exercise to improve strength and stamina. Short: Continue to progressively increase workload on the XR and treadmill. Long: Continue exercise to improve strength and stamina. Short: try interval training in cardiac rehab and purchase a pulse ox to help monitor heart rate during  exercise at home. Long: join [facility] after graduation and continue with independent exercise routien. Short: Continue to progressively increase treadmill workload. Long: Continue exercise to improve strength and stamina. Short: Graduate. Long: Continue to exercise independently.    Row Name 08/28/23 1600 09/11/23 1419 10/06/23 1608         Exercise Goal Re-Evaluation   Exercise Goals Review Increase Physical Activity;Increase Strength and Stamina;Understanding of Exercise Prescription Increase Physical Activity;Increase Strength and Stamina;Understanding of Exercise Prescription --     Comments Jourdain is doing well in rehab but has only attended one session since the last review. He did improve to level 9 on the XR and continues to walk above 3 mph on the treadmill. We will continue to monitor his progress until he graduates from the program. Damaria has not attended reab since 08/11/2023 and has been on medical hold. He reported that he does want to return and complete the program. We will continue to monitor his progress when he returns. Elizardo has not attended reab since 08/11/2023 and has been on medical hold. He reported that he  does want to return and complete the program. We will continue to monitor his progress when he returns.     Expected Outcomes Short: Return to rehab and graduate. Long: Continue to exercise independently. Short: Return to rehab and graduate. Long: Continue to exercise independently. Short: Return to rehab when appropriate. Long: Graduate.              Discharge Exercise Prescription (Final Exercise Prescription Changes):  Exercise Prescription Changes - 08/28/23 1500       Response to Exercise   Blood Pressure (Admit) 136/70    Blood Pressure (Exit) 108/62    Heart Rate (Admit) 60 bpm    Heart Rate (Exercise) 98 bpm    Heart Rate (Exit) 64 bpm    Rating of Perceived Exertion (Exercise) 13    Symptoms none    Duration Continue with 30 min of aerobic exercise  without signs/symptoms of physical distress.    Intensity THRR unchanged      Progression   Progression Continue to progress workloads to maintain intensity without signs/symptoms of physical distress.    Average METs 4.23      Resistance Training   Training Prescription Yes    Weight 8 lb    Reps 10-15      Interval Training   Interval Training No      Treadmill   MPH 3.2    Grade 0    Minutes 15    METs 3.45      REL-XR   Level 9    Minutes 15    METs 5      Home Exercise Plan   Plans to continue exercise at [facility]   Pt plans to join [facility] for aerobic equipment, Walking dog around 1 mile a day   Frequency Add 2 additional days to program exercise sessions.    Initial Home Exercises Provided 06/30/23      Oxygen   Maintain Oxygen Saturation 88% or higher             Nutrition:  Target Goals: Understanding of nutrition guidelines, daily intake of sodium 1500mg , cholesterol 200mg , calories 30% from fat and 7% or less from saturated fats, daily to have 5 or more servings of fruits and vegetables.  Education: All About Nutrition: -Group instruction provided by verbal, written material, interactive activities, discussions, models, and posters to present general guidelines for heart healthy nutrition including fat, fiber, MyPlate, the role of sodium in heart healthy nutrition, utilization of the nutrition label, and utilization of this knowledge for meal planning. Follow up email sent as well. Written material given at graduation. Flowsheet Row Cardiac Rehab from 07/30/2023 in [facility] Cardiac and Pulmonary Rehab  Date 07/30/23  Educator JG  Instruction Review Code 1- Verbalizes Understanding       Biometrics:  Pre Biometrics - 05/19/23 1531       Pre Biometrics   Height 5' 5.5" (1.664 m)    Weight 148 lb 3.2 oz (67.2 kg)    Waist Circumference 35 inches    Hip Circumference 38 inches    Waist to Hip Ratio 0.92 %    BMI (Calculated) 24.28     Single Leg Stand 2.47 seconds             Post Biometrics - 08/07/23 1402        Post  Biometrics   Height 5' 5.5" (1.664 m)    Weight 157 lb 3.2 oz (71.3 kg)    Waist Circumference  33 inches    Hip Circumference 36 inches    Waist to Hip Ratio 0.92 %    BMI (Calculated) 25.75    Single Leg Stand 6.52 seconds             Nutrition Therapy Plan and Nutrition Goals:  Nutrition Therapy & Goals - 05/19/23 1621       Nutrition Therapy   Diet Cardiac, low Na    Protein (specify units) 60-75g    Fiber 30 grams    Whole Grain Foods 3 servings    Saturated Fats 15 max. grams    Fruits and Vegetables 5 servings/day    Sodium 2 grams      Personal Nutrition Goals   Nutrition Goal Drink 64oz of water daily    Personal Goal #2 Eat 15-20gProtein and 30-60gCarbs at each meal.    Personal Goal #3 Read labels and reduce sodium intake to below 2300mg . Ideally 1500mg  per day.    Comments Patient recovering from hospital stay and reports appetite has improved a lot since his inpatient stay, says it could still use more improvement. Spoke today about ways to meet nutritional needs with less quantity of foods, since her reports some nausea and early satiety. Encouraged him to eat a quality protein ~15-20g per meal paired with a complex carb. Reviewed some good pairings and answered questions around protein, types of fats, sodium, and sugar. Reviewed a few facts labels and provided advice on how to read labels and determine if the foods are valuable for his nutritional goals. Encouraged efficient foods like beans, lentils, and hummus. Healthy fat rich foods like peanuts, peanut butter, fish, and olive oil to increase calories with less quantity of food. Wanted to set goal of ~64oz of water daily, reminded him to ensure water intake doesn't further reduce food intake, suggested he sip on water between meal and keep water at meal to what is needed to enjoy the meal and ensure safe swallowing when  appetite is poor. He is optimistic that his appetite will improve and he will be able to eat more normally. Will continue to monitor and support      Intervention Plan   Intervention Prescribe, educate and counsel regarding individualized specific dietary modifications aiming towards targeted core components such as weight, hypertension, lipid management, diabetes, heart failure and other comorbidities.;Nutrition handout(s) given to patient.    Expected Outcomes Short Term Goal: Understand basic principles of dietary content, such as calories, fat, sodium, cholesterol and nutrients.;Short Term Goal: A plan has been developed with personal nutrition goals set during dietitian appointment.;Long Term Goal: Adherence to prescribed nutrition plan.             Nutrition Assessments:  MEDIFICTS Score Key: >=70 Need to make dietary changes  40-70 Heart Healthy Diet <= 40 Therapeutic Level Cholesterol Diet  Flowsheet Row Cardiac Rehab from 05/19/2023 in [facility] Cardiac and Pulmonary Rehab  Picture Your Plate Total Score on Admission 75      Picture Your Plate Scores: <84 Unhealthy dietary pattern with much room for improvement. 41-50 Dietary pattern unlikely to meet recommendations for good health and room for improvement. 51-60 More healthful dietary pattern, with some room for improvement.  >60 Healthy dietary pattern, although there may be some specific behaviors that could be improved.    Nutrition Goals Re-Evaluation:  Nutrition Goals Re-Evaluation     Row Name 06/11/23 1417 07/10/23 1423 07/30/23 1354         Goals   Nutrition Goal -- --  Drink 64oz of water daily     Comment Devere reports his appetite is improving and he is tolerating food better. Remembers discussed with RD about meeting protein goals and limiting sodium and saturated fat. Reviewed some good snack ideas with healthy fats, complex carbs and protein rich foods. Danniel states that he is doing well with his diet. He has  been drinking plenty of water every day. He is still working on getting plenty of protein in his diet. He is also trying to limit sugars and sodium in his diet. He feels he is overall doing well with the dietary guidelines he discussed with the RD. Ramsey states that he is making an effort to drink more water each day. He feels comfortable reading food labels and does not add salt at the table. He has been watching his sodium intake for many years and reports that he feels he is doing well with this. He is trying to incorporate more protein into his diet and includes meat, beans, gin pro protein powder.     Expected Outcome STG: continue to eat heart healthy and focus on recovering appetite. LTG: maintain heart healthy lifestyle Short: Continue to work on getting protein and limiting sodium. Long: Continue to practice heart healthy eating patterns discussed with RD. Short: continue to read food lables and make sure he is getting enough protein each day and limiting sodium. Long: continue to follow heart healthy diet.       Personal Goal #2 Re-Evaluation   Personal Goal #2 -- -- Eat 15-20gProtein and 30-60gCarbs at each meal.       Personal Goal #3 Re-Evaluation   Personal Goal #3 -- -- Read labels and reduce sodium intake to below 2300mg . Ideally 1500mg  per day.              Nutrition Goals Discharge (Final Nutrition Goals Re-Evaluation):  Nutrition Goals Re-Evaluation - 07/30/23 1354       Goals   Nutrition Goal Drink 64oz of water daily    Comment Xane states that he is making an effort to drink more water each day. He feels comfortable reading food labels and does not add salt at the table. He has been watching his sodium intake for many years and reports that he feels he is doing well with this. He is trying to incorporate more protein into his diet and includes meat, beans, gin pro protein powder.    Expected Outcome Short: continue to read food lables and make sure he is getting enough  protein each day and limiting sodium. Long: continue to follow heart healthy diet.      Personal Goal #2 Re-Evaluation   Personal Goal #2 Eat 15-20gProtein and 30-60gCarbs at each meal.      Personal Goal #3 Re-Evaluation   Personal Goal #3 Read labels and reduce sodium intake to below 2300mg . Ideally 1500mg  per day.             Psychosocial: Target Goals: Acknowledge presence or absence of significant depression and/or stress, maximize coping skills, provide positive support system. Participant is able to verbalize types and ability to use techniques and skills needed for reducing stress and depression.   Education: Stress, Anxiety, and Depression - Group verbal and visual presentation to define topics covered.  Reviews how body is impacted by stress, anxiety, and depression.  Also discusses healthy ways to reduce stress and to treat/manage anxiety and depression.  Written material given at graduation. Flowsheet Row Cardiac Rehab from 07/30/2023 in [facility]  Cardiac and Pulmonary Rehab  Date 07/02/23  Educator [facility]  Instruction Review Code 1- [employer] Understanding       Education: Sleep Hygiene -Provides group verbal and written instruction about how sleep can affect your health.  Define sleep hygiene, discuss sleep cycles and impact of sleep habits. Review good sleep hygiene tips.    Initial Review & Psychosocial Screening:  Initial Psych Review & Screening - 04/21/23 1417       Initial Review   Current issues with Current Psychotropic Meds      Family Dynamics   Concerns Recent loss of significant other    Comments loss of 4 significant family members in the last year      Barriers   Psychosocial barriers to participate in program There are no identifiable barriers or psychosocial needs.;The patient should benefit from training in stress management and relaxation.      Screening Interventions   Interventions Encouraged to exercise;To provide support and resources with  identified psychosocial needs;Provide feedback about the scores to participant    Expected Outcomes Short Term goal: Utilizing psychosocial counselor, staff and physician to assist with identification of specific Stressors or current issues interfering with healing process. Setting desired goal for each stressor or current issue identified.;Long Term Goal: Stressors or current issues are controlled or eliminated.;Short Term goal: Identification and review with participant of any Quality of Life or Depression concerns found by scoring the questionnaire.;Long Term goal: The participant improves quality of Life and PHQ9 Scores as seen by post scores and/or verbalization of changes             Quality of Life Scores:   Quality of Life - 05/19/23 1533       Quality of Life   Select Quality of Life      Quality of Life Scores   Health/Function Pre 24.43 %    Socioeconomic Pre 29.58 %    Psych/Spiritual Pre 24 %    Family Pre 30 %    GLOBAL Pre 26.12 %            Scores of 19 and below usually indicate a poorer quality of life in these areas.  A difference of  2-3 points is a clinically meaningful difference.  A difference of 2-3 points in the total score of the Quality of Life Index has been associated with significant improvement in overall quality of life, self-image, physical symptoms, and general health in studies assessing change in quality of life.  PHQ-9: Review Flowsheet  More data exists      05/19/2023 01/09/2022 09/26/2021 08/21/2021 10/17/2020  Depression screen PHQ 2/9  Decreased Interest 1 0 0 0 0  Down, Depressed, Hopeless 0 0 0 0 0  PHQ - 2 Score 1 0 0 0 0  Altered sleeping 1 0 0 1 0  Tired, decreased energy 1 1 0 2 1  Change in appetite 1 0 0 0 1  Feeling bad or failure about yourself  0 0 0 0 0  Trouble concentrating 0 0 0 0 0  Moving slowly or fidgety/restless 0 0 0 0 0  Suicidal thoughts 0 0 0 0 0  PHQ-9 Score 4 1 0 3 2  Difficult doing work/chores Somewhat  difficult Not difficult at all Not difficult at all Not difficult at all Not difficult at all   Interpretation of Total Score  Total Score Depression Severity:  1-4 = Minimal depression, 5-9 = Mild depression, 10-14 = Moderate depression, 15-19 = Moderately  severe depression, 20-27 = Severe depression   Psychosocial Evaluation and Intervention:  Psychosocial Evaluation - 04/21/23 1442       Psychosocial Evaluation & Interventions   Interventions Encouraged to exercise with the program and follow exercise prescription    Comments Render has no barriers to attending the program. He is ready to start and get back to his exercise routine. He lives with his wife and he has her as his support.   He has loss 4 siginificant members of his family in the past year.   He does take wellbutrin  and cymbalta ,[facility] for "[facility]", the meds are helping as he goes through with all his loss this year.    Expected Outcomes STG attends all scheduled sessions, continues with his meds as prescribed to help  deal with his life changes.  LTG Able to continue to manage with his meds as prescribed    Continue Psychosocial Services  Follow up required by staff             Psychosocial Re-Evaluation:  Psychosocial Re-Evaluation     Row Name 06/11/23 1415 07/10/23 1421 07/30/23 1409         Psychosocial Re-Evaluation   Current issues with None Identified None Identified None Identified     Comments Zigmund reports a good mindset and is not exeperiencing any stress, anxiety or depression. He says he has a good support system with family. Says he is sleeping well, wakes up well rested. Carlon reportd no major stressors at this time. He enjoys walking and woodworking for stress relief. He has a good support system around him made up primarily of his wife and two daughters. Devontaye reports no issues with sleep at this time. Jamarkus reports that he has no concerns with his stress levels or mental health at  this time. He states that he has a stable support system. His only concerns is his sleep patterns. He has inconsistent sleep patterns and has been working with his doctor on this. He is in the process of getting set up with a CPAP. He does complain of having brain fog during the day but not really feeling tired. He will continue to work with his doctor to get set up with his CPAP to see if this helps his quality of sleep and day time brain fog.     Expected Outcomes STG: continue to use support system and attend cardiac rehab and education classes. LTG: maintain positive outlook on health and lifestyle changes Short: Continue to exercise for stress relief. Long: Continue to maintain positive outlook. Short: get set up with CPAP machine. Long: get into healhty sleep patterns and maintian this long term.     Interventions Encouraged to attend Cardiac Rehabilitation for the exercise Encouraged to attend Cardiac Rehabilitation for the exercise Encouraged to attend Cardiac Rehabilitation for the exercise     Continue Psychosocial Services  Follow up required by staff Follow up required by staff Follow up required by staff              Psychosocial Discharge (Final Psychosocial Re-Evaluation):  Psychosocial Re-Evaluation - 07/30/23 1409       Psychosocial Re-Evaluation   Current issues with None Identified    Comments Amaad reports that he has no concerns with his stress levels or mental health at this time. He states that he has a stable support system. His only concerns is his sleep patterns. He has inconsistent sleep patterns and has been working with  his doctor on this. He is in the process of getting set up with a CPAP. He does complain of having brain fog during the day but not really feeling tired. He will continue to work with his doctor to get set up with his CPAP to see if this helps his quality of sleep and day time brain fog.    Expected Outcomes Short: get set up with CPAP machine. Long: get  into healhty sleep patterns and maintian this long term.    Interventions Encouraged to attend Cardiac Rehabilitation for the exercise    Continue Psychosocial Services  Follow up required by staff             Vocational Rehabilitation: Provide vocational rehab assistance to qualifying candidates.   Vocational Rehab Evaluation & Intervention:   Education: Education Goals: Education classes will be provided on a variety of topics geared toward better understanding of heart health and risk factor modification. Participant will state understanding/return demonstration of topics presented as noted by education test scores.  Learning Barriers/Preferences:   General Cardiac Education Topics:  AED/CPR: - Group verbal and written instruction with the use of models to demonstrate the basic use of the AED with the basic ABC's of resuscitation.   Anatomy and Cardiac Procedures: - Group verbal and visual presentation and models provide information about basic cardiac anatomy and function. Reviews the testing methods done to diagnose heart disease and the outcomes of the test results. Describes the treatment choices: Medical Management, Angioplasty, or Coronary Bypass Surgery for treating various heart conditions including Myocardial Infarction, Angina, Valve Disease, and Cardiac Arrhythmias.  Written material given at graduation.   Medication Safety: - Group verbal and visual instruction to review commonly prescribed medications for heart and lung disease. Reviews the medication, class of the drug, and side effects. Includes the steps to properly store meds and maintain the prescription regimen.  Written material given at graduation. Flowsheet Row Cardiac Rehab from 07/30/2023 in [facility] Cardiac and Pulmonary Rehab  Date 06/11/23  Educator SB  Instruction Review Code 1- Verbalizes Understanding       Intimacy: - Group verbal instruction through game format to discuss how heart and lung  disease can affect sexual intimacy. Written material given at graduation..   Know Your Numbers and Heart Failure: - Group verbal and visual instruction to discuss disease risk factors for cardiac and pulmonary disease and treatment options.  Reviews associated critical values for Overweight/Obesity, Hypertension, Cholesterol, and Diabetes.  Discusses basics of heart failure: signs/symptoms and treatments.  Introduces Heart Failure Zone chart for action plan for heart failure.  Written material given at graduation. Flowsheet Row Cardiac Rehab from 07/30/2023 in [facility] Cardiac and Pulmonary Rehab  Education need identified 05/19/23  Date 06/18/23  Educator SB  Instruction Review Code 1- Verbalizes Understanding       Infection Prevention: - Provides verbal and written material to individual with discussion of infection control including proper hand washing and proper equipment cleaning during exercise session. Flowsheet Row Cardiac Rehab from 07/30/2023 in [facility] Cardiac and Pulmonary Rehab  Date 05/19/23  Educator [facility]  Instruction Review Code 1- Verbalizes Understanding       Falls Prevention: - Provides verbal and written material to individual with discussion of falls prevention and safety. Flowsheet Row Cardiac Rehab from 07/30/2023 in [facility] Cardiac and Pulmonary Rehab  Date 05/19/23  Educator [facility]  Instruction Review Code 1- Verbalizes Understanding       Other: -Provides group and verbal instruction on various topics (  see comments)   Knowledge Questionnaire Score:  Knowledge Questionnaire Score - 05/19/23 1533       Knowledge Questionnaire Score   Pre Score 24/26             Core Components/Risk Factors/Patient Goals at Admission:  Personal Goals and Risk Factors at Admission - 05/19/23 1532       Core Components/Risk Factors/Patient Goals on Admission    Weight Management Yes    Intervention Weight Management: Provide education and appropriate resources to help  participant work on and attain dietary goals.;Weight Management: Develop a combined nutrition and exercise program designed to reach desired caloric intake, while maintaining appropriate intake of nutrient and fiber, sodium and fats, and appropriate energy expenditure required for the weight goal.    Admit Weight 148 lb 3.2 oz (67.2 kg)    Goal Weight: Short Term 148 lb (67.1 kg)    Goal Weight: Long Term 148 lb (67.1 kg)    Expected Outcomes Short Term: Continue to assess and modify interventions until short term weight is achieved;Long Term: Adherence to nutrition and physical activity/exercise program aimed toward attainment of established weight goal;Weight Maintenance: Understanding of the daily nutrition guidelines, which includes 25-35% calories from fat, 7% or less cal from saturated fats, less than 200mg  cholesterol, less than 1.5gm of sodium, & 5 or more servings of fruits and vegetables daily;Understanding recommendations for meals to include 15-35% energy as protein, 25-35% energy from fat, 35-60% energy from carbohydrates, less than 200mg  of dietary cholesterol, 20-35 gm of total fiber daily;Understanding of distribution of calorie intake throughout the day with the consumption of 4-5 meals/snacks    Heart Failure Yes    Intervention Provide a combined exercise and nutrition program that is supplemented with education, support and counseling about heart failure. Directed toward relieving symptoms such as shortness of breath, decreased exercise tolerance, and extremity edema.    Expected Outcomes Improve functional capacity of life;Short term: Attendance in program 2-3 days a week with increased exercise capacity. Reported lower sodium intake. Reported increased fruit and vegetable intake. Reports medication compliance.;Short term: Daily weights obtained and reported for increase. Utilizing diuretic protocols set by physician.;Long term: Adoption of self-care skills and reduction of barriers for  early signs and symptoms recognition and intervention leading to self-care maintenance.    Hypertension Yes    Intervention Provide education on lifestyle modifcations including regular physical activity/exercise, weight management, moderate sodium restriction and increased consumption of fresh fruit, vegetables, and low fat dairy, alcohol moderation, and smoking cessation.;Monitor prescription use compliance.    Expected Outcomes Short Term: Continued assessment and intervention until BP is < 140/92mm HG in hypertensive participants. < 130/74mm HG in hypertensive participants with diabetes, heart failure or chronic kidney disease.;Long Term: Maintenance of blood pressure at goal levels.    Lipids Yes    Intervention Provide education and support for participant on nutrition & aerobic/resistive exercise along with prescribed medications to achieve LDL 70mg , HDL >40mg .    Expected Outcomes Short Term: Participant states understanding of desired cholesterol values and is compliant with medications prescribed. Participant is following exercise prescription and nutrition guidelines.;Long Term: Cholesterol controlled with medications as prescribed, with individualized exercise RX and with personalized nutrition plan. Value goals: LDL < 70mg , HDL > 40 mg.             Education:Diabetes - Individual verbal and written instruction to review signs/symptoms of diabetes, desired ranges of glucose level fasting, after meals and with exercise. Acknowledge that pre and post  exercise glucose checks will be done for 3 sessions at entry of program.   Core Components/Risk Factors/Patient Goals Review:   Goals and Risk Factor Review     Row Name 06/11/23 1420 07/10/23 1426 07/30/23 1359         Core Components/Risk Factors/Patient Goals Review   Personal Goals Review Weight Management/Obesity Weight Management/Obesity;Hypertension Heart Failure;Hypertension;Lipids     Review He lost a lot of weight after  surgery, appetite and taste were gone. This has improved and he reports he has gained back ~5lbs since starting rehab. Commended him on his recovery and reminded him of caloire and nutrient dense foods to add to meals and snacks. He states that his weight has been maintained around 155-160 lb. He states that he is more interested in gaining muscle mass more than he is the number on the scale. He also reports that he checks his BP at home routinely. He also states that his BP readings have been good and within normal ranges when he checks it. Kanishk states that he continues to try to gain muscle mass and maintain weight. He takes all his cholesterol meds and follows up with his doctor with lab work to manage this. He checks his weight and blood pressure at home. He states he is aware of the signs of heart failure and what to look out for. His BP and weight have been steady.     Expected Outcomes STG: increase strength, weight stabilization. LTG: maintain and achieve strong healthy weight Short: Continue to work towards gaining muscle mass. Long: Continue to manage lifestyle risk factors. Short: cotninue to work towards building muscle mass and strength. continue to monitor weight and BP at home. Long: continue to control cardiac risk ractors.              Core Components/Risk Factors/Patient Goals at Discharge (Final Review):   Goals and Risk Factor Review - 07/30/23 1359       Core Components/Risk Factors/Patient Goals Review   Personal Goals Review Heart Failure;Hypertension;Lipids    Review Everado states that he continues to try to gain muscle mass and maintain weight. He takes all his cholesterol meds and follows up with his doctor with lab work to manage this. He checks his weight and blood pressure at home. He states he is aware of the signs of heart failure and what to look out for. His BP and weight have been steady.    Expected Outcomes Short: cotninue to work towards building muscle mass and  strength. continue to monitor weight and BP at home. Long: continue to control cardiac risk ractors.             ITP Comments:  ITP Comments     Row Name 04/21/23 1442 05/19/23 1524 05/21/23 1310 05/21/23 1505 06/18/23 1127   ITP Comments Virtual orientation call completed today. he has an appointment on Date: 04/24/2023  for EP eval and gym Orientation.  Documentation of diagnosis can be found in [facility] Date: 04/11/2023 . Completed and gym orientation. Initial ITP created and sent for review to Dr. Firman Hughes, Medical Director. 30 Day review completed. Medical Director ITP review done, changes made as directed, and signed approval by Medical Director.    new to program First full day of exercise!  Patient was oriented to gym and equipment including functions, settings, policies, and procedures.  Patient's individual exercise prescription and treatment plan were reviewed.  All starting workloads were established based on the results of the  6 minute walk test done at initial orientation visit.  The plan for exercise progression was also introduced and progression will be customized based on patient's performance and goals. 30 Day review completed. Medical Director ITP review done, changes made as directed, and signed approval by Medical Director.    new to program    Row Name 07/16/23 1027 08/13/23 0821 09/10/23 1318 10/08/23 0952     ITP Comments 30 Day review completed. Medical Director ITP review done, changes made as directed, and signed approval by Medical Director. 30 Day review completed. Medical Director ITP review done, changes made as directed, and signed approval by Medical Director. 30 Day review completed. Medical Director ITP review done, changes made as directed, and signed approval by Medical Director.    out medical reason  since 4/7 30 Day review completed. Medical Director ITP review done, changes made as directed, and signed approval by Medical Director.    out since 4/7              Comments:

## 2023-10-10 DIAGNOSIS — G4733 Obstructive sleep apnea (adult) (pediatric): Secondary | ICD-10-CM | POA: Diagnosis not present

## 2023-10-10 DIAGNOSIS — Z8619 Personal history of other infectious and parasitic diseases: Secondary | ICD-10-CM | POA: Diagnosis not present

## 2023-10-20 ENCOUNTER — Encounter

## 2023-10-22 ENCOUNTER — Encounter

## 2023-10-23 ENCOUNTER — Encounter

## 2023-10-23 ENCOUNTER — Ambulatory Visit: Admitting: Nurse Practitioner

## 2023-10-27 ENCOUNTER — Encounter: Attending: Cardiovascular Disease | Admitting: *Deleted

## 2023-10-27 DIAGNOSIS — I5022 Chronic systolic (congestive) heart failure: Secondary | ICD-10-CM | POA: Insufficient documentation

## 2023-10-27 NOTE — Progress Notes (Signed)
 Daily Session Note  Patient Details  Name: Thomas [name] MRN: [id number] Date of Birth: Sep 08, 1932 Referring Provider:   Flowsheet Row Cardiac Rehab from 05/19/2023 in [facility]  Referring Provider Dr. [name]    Encounter Date: 10/27/2023  Check In:  Session Check In - 10/27/23 1408       Check-In   Supervising physician immediately available to respond to emergencies See telemetry face sheet for immediately available ER MD    Location [facility]-Cardiac & Pulmonary Rehab    Staff Present Hoy Rodney RN,BSN;Maxon Burnell BS, Exercise Physiologist;Kelly Dyane HECKLE, ACSM CEP, Exercise Physiologist;Noah Tickle, BS, Exercise Physiologist;[facility]    Virtual Visit No    Medication changes reported     No    Fall or balance concerns reported    No    Warm-up and Cool-down Performed on first and last piece of equipment    Resistance Training Performed Yes    VAD Patient? No    PAD/SET Patient? No      Pain Assessment   Currently in Pain? No/denies             Social History   Tobacco Use  Smoking Status Never  Smokeless Tobacco Never    Goals Met:  Independence with exercise equipment Exercise tolerated well No report of concerns or symptoms today Strength training completed today  Goals Unmet:  Not Applicable  Comments: Pt able to follow exercise prescription today without complaint.  Will continue to monitor for progression.    Dr. Oneil Pinal is Medical Director for [facility] Cardiac Rehabilitation.  Dr. Fuad Aleskerov is Medical Director for [facility] Pulmonary Rehabilitation.

## 2023-10-28 ENCOUNTER — Observation Stay (HOSPITAL_COMMUNITY)
Admission: EM | Admit: 2023-10-28 | Discharge: 2023-10-30 | Disposition: A | Discharge status: Home / Self Care | Attending: Internal Medicine | Admitting: Internal Medicine

## 2023-10-28 ENCOUNTER — Other Ambulatory Visit: Payer: Self-pay

## 2023-10-28 ENCOUNTER — Ambulatory Visit (INDEPENDENT_AMBULATORY_CARE_PROVIDER_SITE_OTHER): Admitting: Dermatology

## 2023-10-28 ENCOUNTER — Encounter (HOSPITAL_COMMUNITY): Payer: Self-pay

## 2023-10-28 ENCOUNTER — Emergency Department (HOSPITAL_COMMUNITY)

## 2023-10-28 DIAGNOSIS — Z8619 Personal history of other infectious and parasitic diseases: Secondary | ICD-10-CM | POA: Insufficient documentation

## 2023-10-28 DIAGNOSIS — W908XXA Exposure to other nonionizing radiation, initial encounter: Secondary | ICD-10-CM | POA: Diagnosis not present

## 2023-10-28 DIAGNOSIS — I10 Essential (primary) hypertension: Secondary | ICD-10-CM | POA: Diagnosis present

## 2023-10-28 DIAGNOSIS — R531 Weakness: Principal | ICD-10-CM | POA: Insufficient documentation

## 2023-10-28 DIAGNOSIS — Z85828 Personal history of other malignant neoplasm of skin: Secondary | ICD-10-CM | POA: Insufficient documentation

## 2023-10-28 DIAGNOSIS — L578 Other skin changes due to chronic exposure to nonionizing radiation: Secondary | ICD-10-CM

## 2023-10-28 DIAGNOSIS — I6782 Cerebral ischemia: Secondary | ICD-10-CM | POA: Diagnosis not present

## 2023-10-28 DIAGNOSIS — Z8589 Personal history of malignant neoplasm of other organs and systems: Secondary | ICD-10-CM

## 2023-10-28 DIAGNOSIS — I130 Hypertensive heart and chronic kidney disease with heart failure and stage 1 through stage 4 chronic kidney disease, or unspecified chronic kidney disease: Secondary | ICD-10-CM | POA: Diagnosis not present

## 2023-10-28 DIAGNOSIS — I495 Sick sinus syndrome: Secondary | ICD-10-CM | POA: Insufficient documentation

## 2023-10-28 DIAGNOSIS — I5040 Unspecified combined systolic (congestive) and diastolic (congestive) heart failure: Secondary | ICD-10-CM | POA: Diagnosis not present

## 2023-10-28 DIAGNOSIS — I2510 Atherosclerotic heart disease of native coronary artery without angina pectoris: Secondary | ICD-10-CM | POA: Insufficient documentation

## 2023-10-28 DIAGNOSIS — B353 Tinea pedis: Secondary | ICD-10-CM

## 2023-10-28 DIAGNOSIS — M6281 Muscle weakness (generalized): Secondary | ICD-10-CM | POA: Diagnosis not present

## 2023-10-28 DIAGNOSIS — Z79899 Other long term (current) drug therapy: Secondary | ICD-10-CM | POA: Diagnosis not present

## 2023-10-28 DIAGNOSIS — R2689 Other abnormalities of gait and mobility: Secondary | ICD-10-CM | POA: Diagnosis present

## 2023-10-28 DIAGNOSIS — I480 Paroxysmal atrial fibrillation: Secondary | ICD-10-CM | POA: Insufficient documentation

## 2023-10-28 DIAGNOSIS — Z950 Presence of cardiac pacemaker: Secondary | ICD-10-CM | POA: Diagnosis not present

## 2023-10-28 DIAGNOSIS — Z7189 Other specified counseling: Secondary | ICD-10-CM

## 2023-10-28 DIAGNOSIS — N1832 Chronic kidney disease, stage 3b: Secondary | ICD-10-CM | POA: Insufficient documentation

## 2023-10-28 DIAGNOSIS — G919 Hydrocephalus, unspecified: Secondary | ICD-10-CM | POA: Diagnosis not present

## 2023-10-28 DIAGNOSIS — Z7901 Long term (current) use of anticoagulants: Secondary | ICD-10-CM | POA: Insufficient documentation

## 2023-10-28 DIAGNOSIS — Z8673 Personal history of transient ischemic attack (TIA), and cerebral infarction without residual deficits: Secondary | ICD-10-CM | POA: Insufficient documentation

## 2023-10-28 DIAGNOSIS — G4733 Obstructive sleep apnea (adult) (pediatric): Secondary | ICD-10-CM | POA: Diagnosis not present

## 2023-10-28 DIAGNOSIS — B351 Tinea unguium: Secondary | ICD-10-CM | POA: Diagnosis not present

## 2023-10-28 DIAGNOSIS — L821 Other seborrheic keratosis: Secondary | ICD-10-CM | POA: Diagnosis not present

## 2023-10-28 DIAGNOSIS — I6502 Occlusion and stenosis of left vertebral artery: Secondary | ICD-10-CM | POA: Insufficient documentation

## 2023-10-28 DIAGNOSIS — R29898 Other symptoms and signs involving the musculoskeletal system: Secondary | ICD-10-CM

## 2023-10-28 LAB — COMPREHENSIVE METABOLIC PANEL WITH GFR
ALT: 17 U/L (ref 0–44)
AST: 25 U/L (ref 15–41)
Albumin: 3.7 g/dL (ref 3.5–5.0)
Alkaline Phosphatase: 78 U/L (ref 38–126)
Anion gap: 11 (ref 5–15)
BUN: 39 mg/dL — ABNORMAL HIGH (ref 8–23)
CO2: 24 mmol/L (ref 22–32)
Calcium: 9.7 mg/dL (ref 8.9–10.3)
Chloride: 104 mmol/L (ref 98–111)
Creatinine, Ser: 1.89 mg/dL — ABNORMAL HIGH (ref 0.61–1.24)
GFR, Estimated: 34 mL/min — ABNORMAL LOW (ref >60)
Glucose, Bld: 94 mg/dL (ref 70–99)
Potassium: 4.3 mmol/L (ref 3.5–5.1)
Sodium: 139 mmol/L (ref 135–145)
Total Bilirubin: 0.4 mg/dL (ref 0.0–1.2)
Total Protein: 7.1 g/dL (ref 6.5–8.1)

## 2023-10-28 LAB — CBC
HCT: 47.2 % (ref 39.0–52.0)
Hemoglobin: 15.1 g/dL (ref 13.0–17.0)
MCH: 28.3 pg (ref 26.0–34.0)
MCHC: 32 g/dL (ref 30.0–36.0)
MCV: 88.4 fL (ref 80.0–100.0)
Platelets: 183 10*3/uL (ref 150–400)
RBC: 5.34 MIL/uL (ref 4.22–5.81)
RDW: 14.2 % (ref 11.5–15.5)
WBC: 7 10*3/uL (ref 4.0–10.5)
nRBC: 0 % (ref 0.0–0.2)

## 2023-10-28 LAB — I-STAT CHEM 8, ED
BUN: 42 mg/dL — ABNORMAL HIGH (ref 8–23)
Calcium, Ion: 1.27 mmol/L (ref 1.15–1.40)
Chloride: 103 mmol/L (ref 98–111)
Creatinine, Ser: 2 mg/dL — ABNORMAL HIGH (ref 0.61–1.24)
Glucose, Bld: 91 mg/dL (ref 70–99)
HCT: 47 % (ref 39.0–52.0)
Hemoglobin: 16 g/dL (ref 13.0–17.0)
Potassium: 4.3 mmol/L (ref 3.5–5.1)
Sodium: 139 mmol/L (ref 135–145)
TCO2: 25 mmol/L (ref 22–32)

## 2023-10-28 LAB — PROTIME-INR
INR: 1.1 (ref 0.8–1.2)
Prothrombin Time: 14.7 s (ref 11.4–15.2)

## 2023-10-28 LAB — CBG MONITORING, ED: Glucose-Capillary: 103 mg/dL — ABNORMAL HIGH (ref 70–99)

## 2023-10-28 LAB — APTT: aPTT: 31 s (ref 24–36)

## 2023-10-28 LAB — ETHANOL: Alcohol, Ethyl (B): <15 mg/dL (ref <15)

## 2023-10-28 NOTE — ED Provider Notes (Signed)
 Care assumed from Dr. Franklyn, patient arrived as a Code Stroke, neurology recommendations pending.  Neurology consultation is appreciated.  Patient will need to be admitted for MRI in the morning.  Because of pacemaker, MRI will have to be done during the daytime.  I have reviewed his electrocardiogram, my interpretation is AV dual paced rhythm unchanged from prior.  I have discussed case with Dr. Sundil, of [facility], who agrees to admit the patient.   EKG Interpretation Date/Time:  Tuesday October 28 2023 22:51:52 EDT Ventricular Rate:  61 PR Interval:  178 QRS Duration:  138 QT Interval:  472 QTC Calculation: 475 R Axis:   9  Text Interpretation: AV dual-paced rhythm Abnormal ECG When compared with ECG of 03-Oct-2023 11:34, No significant change was found Confirmed by Raford Lenis ([id number]) on 10/29/2023 12:56:51 AM          Raford Lenis, MD 10/29/23 0127

## 2023-10-28 NOTE — Progress Notes (Deleted)
   Follow-Up Visit   Subjective  Thomas [name] is a 89 y.o. male who presents for the following: Skin Cancer Screening and Upper Body Skin Exam  The patient presents for Upper Body Skin Exam (UBSE) for skin cancer screening and mole check. The patient has spots, moles and lesions to be evaluated, some may be new or changing and the patient may have concern these could be cancer.    The following portions of the chart were reviewed this encounter and updated as appropriate: medications, allergies, medical history  Review of Systems:  No other skin or systemic complaints except as noted in HPI or Assessment and Plan.  Objective  Well appearing patient in no apparent distress; mood and affect are within normal limits.  All skin waist up examined. Relevant physical exam findings are noted in the Assessment and Plan.    Assessment & Plan    Skin cancer screening performed today.  Actinic Damage - Chronic condition, secondary to cumulative UV/sun exposure - diffuse scaly erythematous macules with underlying dyspigmentation - Recommend daily broad spectrum sunscreen SPF 30+ to sun-exposed areas, reapply every 2 hours as needed.  - Staying in the shade or wearing long sleeves, sun glasses (UVA+UVB protection) and wide brim hats (4-inch brim around the entire circumference of the hat) are also recommended for sun protection.  - Call for new or changing lesions.  Lentigines, Seborrheic Keratoses, Hemangiomas - Benign normal skin lesions - Benign-appearing - Call for any changes  Melanocytic Nevi - Tan-brown and/or pink-flesh-colored symmetric macules and papules - Benign appearing on exam today - Observation - Call clinic for new or changing moles - Recommend daily use of broad spectrum spf 30+ sunscreen to sun-exposed areas.     No follow-ups on file.  [name], CMA, am acting as scribe for Alm Rhyme, MD .   Documentation: I have reviewed the above documentation  for accuracy and completeness, and I agree with the above.  Alm Rhyme, MD

## 2023-10-28 NOTE — Progress Notes (Unsigned)
 Follow-Up Visit   Subjective  Thomas [name] is a 89 y.o. male who presents for the following: tinea pedis and unguium follow up s/p terbinafine  250 mg po QD x 3 mths. Condition did clear, but a few weeks later scale and erythema returned. Recheck sun exposed areas for Aks.  The patient has spots, moles and lesions to be evaluated, some may be new or changing and the patient may have concern these could be cancer.  The following portions of the chart were reviewed this encounter and updated as appropriate: medications, allergies, medical history  Review of Systems:  No other skin or systemic complaints except as noted in HPI or Assessment and Plan.  Objective  Well appearing patient in no apparent distress; mood and affect are within normal limits.  A focused examination was performed of the following areas: the feet, face, arms, hands, ears, and trunk  Relevant exam findings are noted in the Assessment and Plan.   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  Exam: Scaling and maceration web spaces and over distal and lateral soles. Chronic and persistent condition with duration or expected duration over one year. Condition is improving with treatment but not currently at goal.  Treatment Plan: Start Ketoconazole 2% cream QHS to feet.  Restart Terbinafine  250 mg po QD x 1 more month.  Consider Fluconazole if tinea remains persistent.  Terbinafine  Counseling Terbinafine  is an anti-fungal medicine that can be applied to the skin (over the counter) or taken by mouth (prescription) to treat fungal infections. The pill version is often used to treat fungal infections of the nails or scalp. While most people do not have any side effects from taking terbinafine  pills, some possible side effects of the medicine can include taste changes,  headache, loss of smell, vision changes, nausea, vomiting, or diarrhea.   Rare side effects can include irritation of the liver, allergic reaction, or decrease in blood counts (which may show up as not feeling well or developing an infection). If you are concerned about any of these side effects, please stop the medicine and call your doctor, or in the case of an emergency such as feeling very unwell, seek immediate medical care.   ACTINIC DAMAGE - chronic, secondary to cumulative UV radiation exposure/sun exposure over time - diffuse scaly erythematous macules with underlying dyspigmentation - Recommend daily broad spectrum sunscreen SPF 30+ to sun-exposed areas, reapply every 2 hours as needed.  - Recommend staying in the shade or wearing long sleeves, sun glasses (UVA+UVB protection) and wide brim hats (4-inch brim around the entire circumference of the hat). - Call for new or changing lesions.  SEBORRHEIC KERATOSIS - Stuck-on, waxy, tan-brown papules and/or plaques  - Benign-appearing - Discussed benign etiology and prognosis. 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  - Call if any new or changing lesions are noted between office visits  HISTORY OF SQUAMOUS CELL CARCINOMA OF THE SKIN - No evidence of recurrence today - No lymphadenopathy - Recommend regular full body skin exams - Recommend daily broad spectrum sunscreen SPF 30+ to sun-exposed areas,  reapply every 2 hours as needed.  - Call if any new or changing lesions are noted between office visits  ACTINIC SKIN DAMAGE   SEBORRHEIC KERATOSIS   TINEA PEDIS OF BOTH FEET   TINEA UNGUIUM   COUNSELING AND COORDINATION OF CARE   MEDICATION MANAGEMENT   LONG-TERM USE OF HIGH-RISK  MEDICATION   HISTORY OF LYME DISEASE    Return in about 6 months (around 04/28/2024) for tinea and AK follow up.  [name], CMA, am acting as scribe for Alm Rhyme, MD .   Documentation: I have reviewed the above documentation for accuracy and completeness, and I agree with the above.  Alm Rhyme, MD

## 2023-10-28 NOTE — Code Documentation (Signed)
 Responded to Code Stroke called on pt who arrived to the ED at 2242 c/o L sided weakness/numbness, unsteady gait, and dizziness, [id number]. Code Stroke called at 2256. CBG-103, NIH-1, CT head negative for acute changes. TNK not given-pt on eliquis . Plan: MRI. Please complete VS/neuro checks(including NIHs) q2h x 12h, then q4h.

## 2023-10-28 NOTE — Patient Instructions (Signed)

## 2023-10-28 NOTE — ED Triage Notes (Signed)
 Patient reports sudden onset left foot  dragging , unsteady gait/dizziness and left forearm/hand numbness onset 9:30 pm . Takes Eliquis  , History of CVA . Code Stroke called , EDP evaluated patient and cleared for CT scan .

## 2023-10-28 NOTE — ED Provider Notes (Signed)
 [place] EMERGENCY DEPARTMENT AT [facility] Provider Note   CSN: [id number] Arrival date & time: 10/28/23  2242     History  Chief Complaint  Patient presents with   Code Stroke ; LSW 2130    Thomas [name] is a 89 y.o. male with PMH as listed below who presents brought in by his wife with concerns for stroke. Has h/o remote CVA that left him with numbness in L hand. At approximately 9 pm, patient noted that he had acute onset foot drop of his left foot and that his foot was dragging on the carpet. Also w/ worsening L hand numbness from baseline. Does take eliquis , last dose was 9 pm. No h/o head trauma/falls, no other neurologic sxs.    Past Medical History:  Diagnosis Date   Aortic insufficiency    a. 07/2023 Echo: Mod AI.   Aortic valve disorders    Arthritis    Basal cell carcinoma    face, nose left shoulder, left arm (06/19/2016)   Basal cell carcinoma 09/13/2020   right temple   BBB (bundle branch block)    hx right   CAD (coronary artery disease)    a. 10/2017 Cath: mod, nonobs dzs; b. 02/2021 Cath: mod, nonobs dzs; c. 11/2022 Cath: LAD 30ost/p, 57m, D1 min irregs, LCX 65p/m, OM1 min irregs, OM2 40, OM3 nl, RCA 30p, 90m, 40d w/ 40 in side branch-->Med rx.   Chronic back pain    neck, thoracic, lower back (06/19/2016)   CKD (chronic kidney disease), stage III (HCC)    Complete heart block (HCC) 06/2016   a. 06/2016 s/p MDT PPM; b. 01/2023 s/p upgrade to MDT CRT-P (ser # [id number]).   GERD (gastroesophageal reflux disease)    Gout    Heart block    I've had type I, II Wenke before now (06/19/2016)   History of gout    History of hiatal hernia    self dx'd (06/19/2016)   Hyperlipidemia    Hypertension    Lyme disease    dx'd by me 2003; cx's showed dx 08/2015   Migraine    3-4/year (06/19/2016)   Mitral regurgitation    a. 07/2023 Echo: Mod MR.   NICM (nonischemic cardiomyopathy - in setting of RV pacing) (HCC)    a. 02/2019 Echo: EF 45-50%; b.  10/2022 Echo: EF 30-35%; c. 01/2023 CRT-P upgrade; e. 07/2023 Echo: EF 40-45%, glob HK, GrI DD, nl RV size/fxn, RVSP 42.2 mmHg, mildly dil LA, mod MR, mod AI.   PAF (paroxysmal atrial fibrillation) (HCC)    a. CHA2DS2VASc = 7--> eliquis ; b. On tikosyn .   Paroxysmal atrial flutter (HCC)    Presence of permanent cardiac pacemaker    PVC's (premature ventricular contractions)    Renal cancer, left (HCC) 2006   S/P cryotherapy   Spinal stenosis    cervical, 1 thoracic, lumbar (06/19/2016)   Squamous carcinoma    face, nose left shoulder, left arm (06/19/2016)   Stroke (HCC)    TIA (transient ischemic attack) 06/14/2016   I'm not sure that's what it was (06/25/2016)   Visit for monitoring Tikosyn  therapy 09/09/2017       Home Medications Prior to Admission medications   Medication Sig Start Date End Date Taking? Authorizing Provider  acetaminophen  (TYLENOL ) 500 MG tablet Take 500-1,000 mg by mouth every 6 (six) hours as needed for mild pain (pain score 1-3) or fever.    [provider]  apixaban  (ELIQUIS ) 2.5 MG TABS tablet  Take 1 tablet (2.5 mg total) by mouth 2 (two) times daily. 12/05/22   Fernande Elspeth BROCKS, MD  Artificial Tears ophthalmic solution Place 1 drop into both eyes in the morning, at noon, in the evening, and at bedtime.    [provider]  atorvastatin  (LIPITOR) 40 MG tablet Take 1 tablet (40 mg total) by mouth daily. 04/16/23   Fernande Elspeth BROCKS, MD  buPROPion  (WELLBUTRIN  XL) 150 MG 24 hr tablet Take 150 mg by mouth daily. 10/29/22   [provider]  calcium  carbonate (TUMS - DOSED IN MG ELEMENTAL CALCIUM ) 500 MG chewable tablet Chew 1-2 tablets by mouth daily as needed for indigestion.    [provider]  Coenzyme Q10 (COQ10) 100 MG CAPS Take 100 mg by mouth daily.    [provider]  colchicine  0.6 MG tablet Take 0.6 mg by mouth daily as needed (for gout flares). 04/23/22   [provider]  docusate sodium  (COLACE) 100 MG  capsule Take 100 mg by mouth daily as needed for mild constipation.    [provider]  dofetilide  (TIKOSYN ) 125 MCG capsule TAKE 1 CAPSULE BY MOUTH TWICE DAILY 07/22/23   Fernande Elspeth BROCKS, MD  DULoxetine  (CYMBALTA ) 20 MG capsule Take 20 mg by mouth 2 (two) times daily.    [provider]  febuxostat  (ULORIC ) 40 MG tablet Take 1 tablet (40 mg total) by mouth daily. 08/21/21   [name], MD  gabapentin  (NEURONTIN ) 300 MG capsule Take 300 mg by mouth at bedtime. 07/06/20   [provider]  hydrALAZINE  (APRESOLINE ) 25 MG tablet Take 1 tablet (25 mg total) by mouth as needed (for a Systolic reading of 160 that will not decrease after 30 minutes rest.). 05/05/23   Tobie Yetta HERO, MD  isosorbide  mononitrate (IMDUR ) 30 MG 24 hr tablet Take 0.5 tablets (15 mg total) by mouth daily. 10/03/23 01/01/24  [name], NP  methocarbamol  (ROBAXIN ) 500 MG tablet Take 500 mg by mouth every 6 (six) hours as needed for muscle spasms. 02/12/23   [provider]  metoprolol  succinate (TOPROL -XL) 50 MG 24 hr tablet TAKE 1 TABLET BY MOUTH DAILY WITH OR IMMEDIATELY FOLLOWING A MEAL Patient taking differently: Take 50 mg by mouth at bedtime. 04/16/23   Fernande Elspeth BROCKS, MD  metroNIDAZOLE  (FLAGYL ) 500 MG tablet TAKE 1 TABLET BY MOUTH TWICE DAILY ON SUNDAY AND MONDAY. Patient taking differently: Take 1,000 mg by mouth See admin instructions. Take 1,000 mg by mouth on Saturday and Sunday- EVERY OTHER WEEKEND 11/23/21   [name], MD  NEEDLE, DISP, 18 G (BD DISP NEEDLES) 18G X 1-1/2 MISC 1 mg by Does not apply route every 14 (fourteen) days. 05/26/20   Helon Kirsch A, PA-C  NEEDLE, DISP, 21 G (BD DISP NEEDLES) 21G X 1-1/2 MISC 1 mg by Does not apply route every 14 (fourteen) days. 05/26/20   Helon Kirsch A, PA-C  nitroGLYCERIN  (NITROSTAT ) 0.4 MG SL tablet Place 1 tablet (0.4 mg total) under the tongue every 5 (five) minutes as needed for chest pain. 10/03/23 01/01/24   [name], NP  omeprazole (PRILOSEC) 20 MG capsule Take 20 mg by mouth daily as needed (for heartburn- 30 minutes before a meal). 05/16/21   [provider]  sacubitril -valsartan  (ENTRESTO ) 24-26 MG Take 1 tablet by mouth 2 (two) times daily. 04/16/23   Fernande Elspeth BROCKS, MD  Syringe, Disposable, (2-3CC SYRINGE) 3 ML MISC 1 mg by Does not apply route every 14 (fourteen)  days. 05/26/20   Helon Kirsch A, PA-C  terbinafine  (LAMISIL ) 250 MG tablet Take 1 tablet (250 mg total) by mouth daily. 07/09/23   Jackquline Sawyer, MD  Testosterone  1.62 % GEL APPLY 2 PUMPS DAILY 10/03/23   Helon Kirsch A, PA-C  testosterone  cypionate (DEPOTESTOSTERONE CYPIONATE) 200 MG/ML injection INJECT 1 ML (200 MG TOTAL) INTO THE MUSCLE EVERY 28 DAYS 10/08/23   McGowan, Kirsch A, PA-C      Allergies    Other, Iodinated contrast media, Iodine, and Penicillins    Review of Systems   Review of Systems A 10 point review of systems was performed and is negative unless otherwise reported in HPI.  Physical Exam Updated Vital Signs BP (!) 144/64 (BP Location: Right Arm)   Pulse 71   Temp 98 F (36.7 C) (Oral)   Resp 15   SpO2 100%  Physical Exam General: Normal appearing male, lying in bed.  HEENT: PERRLA, EOMI, Sclera anicteric, MMM, trachea midline. Tongue protrudes midline. Cardiology: RRR, no murmurs/rubs/gallops.  Resp: Normal respiratory rate and effort. CTAB, no wheezes, rhonchi, crackles.  Abd: Soft, non-tender, non-distended. No rebound tenderness or guarding.  GU: Deferred. MSK: No peripheral edema or signs of trauma. Extremities without deformity or TTP.  Skin: warm, dry.  Neuro: A&Ox4, CNs II-XII grossly intact. 5/5 strength all extremities. Sensation grossly intact. Normal speech. No facial droop. Intact visual fields. Psych: Normal mood and affect.   1a  Level of consciousness: 0=alert; keenly responsive  1b. LOC questions:  0=Performs both tasks correctly  1c. LOC commands:  0=Performs both tasks correctly  2.  Best Gaze: 0=normal  3.  Visual: 0=No visual loss  4. Facial Palsy: 0=Normal symmetric movement  5a.  Motor left arm: 0=No drift, limb holds 90 (or 45) degrees for full 10 seconds  5b.  Motor right arm: 0=No drift, limb holds 90 (or 45) degrees for full 10 seconds  6a. motor left leg: 0=No drift, limb holds 90 (or 45) degrees for full 10 seconds  6b  Motor right leg:  0=No drift, limb holds 90 (or 45) degrees for full 10 seconds  7. Limb Ataxia: 0=Absent  8.  Sensory: 1=Mild to moderate sensory loss; patient feels pinprick is less sharp or is dull on the affected side; there is a loss of superficial pain with pinprick but patient is aware He is being touched  9. Best Language:  0=No aphasia, normal  10. Dysarthria: 0=Normal  11. Extinction and Inattention: 0=No abnormality   Total:   1        ED Results / Procedures / Treatments   Labs (all labs ordered are listed, but only abnormal results are displayed) Labs Reviewed  CBG MONITORING, ED - Abnormal; Notable for the following components:      Result Value   Glucose-Capillary 103 (*)    All other components within normal limits  ETHANOL  PROTIME-INR  APTT  CBC  DIFFERENTIAL  COMPREHENSIVE METABOLIC PANEL WITH GFR  RAPID URINE DRUG SCREEN, HOSP PERFORMED  I-STAT CHEM 8, ED    EKG EKG Interpretation Date/Time:  Tuesday October 28 2023 22:51:52 EDT Ventricular Rate:  61 PR Interval:  178 QRS Duration:  138 QT Interval:  472 QTC Calculation: 475 R Axis:   9  Text Interpretation: AV dual-paced rhythm Abnormal ECG When compared with ECG of 03-Oct-2023 11:34, No significant change was found Confirmed by Raford Lenis ([id number]) on 10/29/2023 12:56:51 AM  Radiology No results found.  Procedures Procedures    Medications  Ordered in ED Medications   stroke: early stages of recovery book ( Does not apply Given 10/30/23 0911)    ED Course/ Medical Decision Making/ A&P                           Medical Decision Making Amount and/or Complexity of Data Reviewed Labs: ordered. Decision-making details documented in ED Course. Radiology: ordered.  Risk Decision regarding hospitalization.    This patient presents to the ED for concern of concern for weakness in LLE and worsening sxs in LUE that are chronic for him., this involves an extensive number of treatment options, and is a complaint that carries with it a high risk of complications and morbidity.  I considered the following differential and admission for this acute, potentially life threatening condition.   MDM:    Given the acute onset of neurological symptoms, stroke is the most concerning etiology of these acute symptoms vs recrudescence vs other metabolic etiology. The neuro exam is significant for No significant weakness, only chronic LUE sensory sxs.   Neurology team evaluating patient at bedside. Plan to obtain emergent CT brain.  LKN: 2100 Glucose: 103 AC: eliquis , last dose this evening BP: 144/64     Clinical Course as of 10/31/23 0930  Tue Oct 28, 2023  2303 Glucose-Capillary(!): 103 [HN]    Clinical Course User Index [HN] Franklyn Sid SAILOR, MD    Labs: I Ordered, and personally interpreted labs.  The pertinent results include:  those listed above  Imaging Studies ordered: I ordered imaging studies including CTH I independently visualized and interpreted imaging. I agree with the radiologist interpretation  Additional history obtained from chart review, wife at bedside  Social Determinants of Health: Lives independently  Disposition:  Patient is signed out to the oncoming ED physician Dr. Raford who is made aware of his history, presentation, exam, workup, and plan. Plan is to obtain imaging and continue lab w/u, d/w neuro for recs.   Co morbidities that complicate the patient evaluation  Past Medical History:  Diagnosis Date   Aortic insufficiency    a. 07/2023 Echo: Mod AI.   Aortic  valve disorders    Arthritis    Basal cell carcinoma    face, nose left shoulder, left arm (06/19/2016)   Basal cell carcinoma 09/13/2020   right temple   BBB (bundle branch block)    hx right   CAD (coronary artery disease)    a. 10/2017 Cath: mod, nonobs dzs; b. 02/2021 Cath: mod, nonobs dzs; c. 11/2022 Cath: LAD 30ost/p, 20m, D1 min irregs, LCX 65p/m, OM1 min irregs, OM2 40, OM3 nl, RCA 30p, 64m, 40d w/ 40 in side branch-->Med rx.   Chronic back pain    neck, thoracic, lower back (06/19/2016)   CKD (chronic kidney disease), stage III (HCC)    Complete heart block (HCC) 06/2016   a. 06/2016 s/p MDT PPM; b. 01/2023 s/p upgrade to MDT CRT-P (ser # [id number]).   GERD (gastroesophageal reflux disease)    Gout    Heart block    I've had type I, II Wenke before now (06/19/2016)   History of gout    History of hiatal hernia    self dx'd (06/19/2016)   Hyperlipidemia    Hypertension    Lyme disease    dx'd by me 2003; cx's showed dx 08/2015   Migraine    3-4/year (06/19/2016)   Mitral regurgitation    a. 07/2023 Echo: Mod  MR.   NICM (nonischemic cardiomyopathy - in setting of RV pacing) (HCC)    a. 02/2019 Echo: EF 45-50%; b. 10/2022 Echo: EF 30-35%; c. 01/2023 CRT-P upgrade; e. 07/2023 Echo: EF 40-45%, glob HK, GrI DD, nl RV size/fxn, RVSP 42.2 mmHg, mildly dil LA, mod MR, mod AI.   PAF (paroxysmal atrial fibrillation) (HCC)    a. CHA2DS2VASc = 7--> eliquis ; b. On tikosyn .   Paroxysmal atrial flutter (HCC)    Presence of permanent cardiac pacemaker    PVC's (premature ventricular contractions)    Renal cancer, left (HCC) 2006   S/P cryotherapy   Spinal stenosis    cervical, 1 thoracic, lumbar (06/19/2016)   Squamous carcinoma    face, nose left shoulder, left arm (06/19/2016)   Stroke (HCC)    TIA (transient ischemic attack) 06/14/2016   I'm not sure that's what it was (06/25/2016)   Visit for monitoring Tikosyn  therapy 09/09/2017     Medicines No orders of the defined  types were placed in this encounter.   I have reviewed the patients home medicines and have made adjustments as needed  Problem List / ED Course: Problem List Items Addressed This Visit       Nervous and Auditory   * (Principal) Left-sided weakness - Primary                This note was created using dictation software, which may contain spelling or grammatical errors.    Franklyn Sid SAILOR, MD 10/31/23 [phone]

## 2023-10-29 ENCOUNTER — Observation Stay (HOSPITAL_COMMUNITY)

## 2023-10-29 ENCOUNTER — Encounter: Payer: Self-pay | Admitting: Dermatology

## 2023-10-29 ENCOUNTER — Encounter (HOSPITAL_COMMUNITY): Payer: Self-pay | Admitting: Internal Medicine

## 2023-10-29 ENCOUNTER — Encounter

## 2023-10-29 ENCOUNTER — Encounter: Payer: Self-pay | Admitting: Cardiovascular Disease

## 2023-10-29 DIAGNOSIS — Z950 Presence of cardiac pacemaker: Secondary | ICD-10-CM

## 2023-10-29 DIAGNOSIS — I5040 Unspecified combined systolic (congestive) and diastolic (congestive) heart failure: Secondary | ICD-10-CM | POA: Insufficient documentation

## 2023-10-29 DIAGNOSIS — R531 Weakness: Secondary | ICD-10-CM

## 2023-10-29 DIAGNOSIS — I130 Hypertensive heart and chronic kidney disease with heart failure and stage 1 through stage 4 chronic kidney disease, or unspecified chronic kidney disease: Secondary | ICD-10-CM | POA: Diagnosis not present

## 2023-10-29 DIAGNOSIS — R29898 Other symptoms and signs involving the musculoskeletal system: Secondary | ICD-10-CM

## 2023-10-29 DIAGNOSIS — Z8673 Personal history of transient ischemic attack (TIA), and cerebral infarction without residual deficits: Secondary | ICD-10-CM | POA: Diagnosis not present

## 2023-10-29 DIAGNOSIS — N1832 Chronic kidney disease, stage 3b: Secondary | ICD-10-CM | POA: Diagnosis not present

## 2023-10-29 DIAGNOSIS — I480 Paroxysmal atrial fibrillation: Secondary | ICD-10-CM

## 2023-10-29 DIAGNOSIS — Z8619 Personal history of other infectious and parasitic diseases: Secondary | ICD-10-CM

## 2023-10-29 DIAGNOSIS — I495 Sick sinus syndrome: Secondary | ICD-10-CM | POA: Diagnosis not present

## 2023-10-29 DIAGNOSIS — Z7901 Long term (current) use of anticoagulants: Secondary | ICD-10-CM | POA: Diagnosis not present

## 2023-10-29 DIAGNOSIS — I10 Essential (primary) hypertension: Secondary | ICD-10-CM

## 2023-10-29 DIAGNOSIS — G459 Transient cerebral ischemic attack, unspecified: Secondary | ICD-10-CM | POA: Diagnosis not present

## 2023-10-29 DIAGNOSIS — G4733 Obstructive sleep apnea (adult) (pediatric): Secondary | ICD-10-CM | POA: Insufficient documentation

## 2023-10-29 DIAGNOSIS — I6502 Occlusion and stenosis of left vertebral artery: Secondary | ICD-10-CM | POA: Diagnosis not present

## 2023-10-29 DIAGNOSIS — I2510 Atherosclerotic heart disease of native coronary artery without angina pectoris: Secondary | ICD-10-CM | POA: Diagnosis not present

## 2023-10-29 LAB — RAPID URINE DRUG SCREEN, HOSP PERFORMED
Amphetamines: NOT DETECTED
Barbiturates: NOT DETECTED
Benzodiazepines: NOT DETECTED
Cocaine: NOT DETECTED
Opiates: NOT DETECTED
Tetrahydrocannabinol: NOT DETECTED

## 2023-10-29 LAB — ECHOCARDIOGRAM COMPLETE
AR max vel: 2.37 cm2
AV Area VTI: 3.03 cm2
AV Area mean vel: 2.21 cm2
AV Mean grad: 7 mmHg
AV Peak grad: 14.7 mmHg
Ao pk vel: 1.92 m/s
Area-P 1/2: 2.28 cm2
Calc EF: 49.8 %
Height: 65 in
P 1/2 time: 571 ms
S' Lateral: 4 cm
Single Plane A2C EF: 53.6 %
Single Plane A4C EF: 47.5 %
Weight: 2363.33 [oz_av]

## 2023-10-29 LAB — HEMOGLOBIN A1C
Hgb A1c MFr Bld: 6.2 % — ABNORMAL HIGH (ref 4.8–5.6)
Mean Plasma Glucose: 131.24 mg/dL

## 2023-10-29 LAB — DIFFERENTIAL
Abs Immature Granulocytes: 0.02 10*3/uL (ref 0.00–0.07)
Basophils Absolute: 0.1 10*3/uL (ref 0.0–0.1)
Basophils Relative: 1 %
Eosinophils Absolute: 0.2 10*3/uL (ref 0.0–0.5)
Eosinophils Relative: 3 %
Immature Granulocytes: 0 %
Lymphocytes Relative: 39 %
Lymphs Abs: 2.7 10*3/uL (ref 0.7–4.0)
Monocytes Absolute: 0.9 10*3/uL (ref 0.1–1.0)
Monocytes Relative: 12 %
Neutro Abs: 3.2 10*3/uL (ref 1.7–7.7)
Neutrophils Relative %: 45 %
Smear Review: NORMAL

## 2023-10-29 LAB — LIPID PANEL
Cholesterol: 148 mg/dL (ref 0–200)
HDL: 45 mg/dL (ref >40)
LDL Cholesterol: 91 mg/dL (ref 0–99)
Total CHOL/HDL Ratio: 3.3 ratio
Triglycerides: 59 mg/dL (ref <150)
VLDL: 12 mg/dL (ref 0–40)

## 2023-10-29 MED ORDER — ACETAMINOPHEN 160 MG/5ML PO SOLN: 650.0000 mg | ORAL | Status: DC | PRN

## 2023-10-29 MED ORDER — TERBINAFINE HCL 250 MG PO TABS: 250.0000 mg | ORAL_TABLET | Freq: Every day | ORAL | 0 refills | Status: AC

## 2023-10-29 MED ORDER — APIXABAN 2.5 MG PO TABS
2.5000 mg | ORAL_TABLET | Freq: Two times a day (BID) | ORAL | Status: DC
  Administered 2023-10-29 – 2023-10-30 (×3): 2.5 mg via ORAL
  Filled 2023-10-29 (×3): qty 1

## 2023-10-29 MED ORDER — HYDRALAZINE HCL 20 MG/ML IJ SOLN: 10.0000 mg | Freq: Four times a day (QID) | INTRAMUSCULAR | Status: DC | PRN

## 2023-10-29 MED ORDER — DOFETILIDE 125 MCG PO CAPS
125.0000 ug | ORAL_CAPSULE | Freq: Two times a day (BID) | ORAL | Status: DC
  Administered 2023-10-29 – 2023-10-30 (×2): 125 ug via ORAL
  Filled 2023-10-29 (×3): qty 1

## 2023-10-29 MED ORDER — ATORVASTATIN CALCIUM 80 MG PO TABS
80.0000 mg | ORAL_TABLET | Freq: Every day | ORAL | Status: DC
  Administered 2023-10-29 – 2023-10-30 (×2): 80 mg via ORAL
  Filled 2023-10-29 (×2): qty 1

## 2023-10-29 MED ORDER — ATORVASTATIN CALCIUM 40 MG PO TABS: 40.0000 mg | ORAL_TABLET | Freq: Every day | ORAL | Status: DC

## 2023-10-29 MED ORDER — STROKE: EARLY STAGES OF RECOVERY BOOK: Freq: Once | Status: DC

## 2023-10-29 MED ORDER — ACETAMINOPHEN 325 MG PO TABS: 650.0000 mg | ORAL_TABLET | ORAL | Status: DC | PRN

## 2023-10-29 MED ORDER — ACETAMINOPHEN 650 MG RE SUPP: 650.0000 mg | RECTAL | Status: DC | PRN

## 2023-10-29 MED ORDER — PANTOPRAZOLE SODIUM 40 MG PO TBEC
40.0000 mg | DELAYED_RELEASE_TABLET | Freq: Every day | ORAL | Status: DC
  Administered 2023-10-29 – 2023-10-30 (×2): 40 mg via ORAL
  Filled 2023-10-29 (×2): qty 1

## 2023-10-29 MED ORDER — BUPROPION HCL ER (XL) 150 MG PO TB24
150.0000 mg | ORAL_TABLET | Freq: Every day | ORAL | Status: DC
  Administered 2023-10-29 – 2023-10-30 (×2): 150 mg via ORAL
  Filled 2023-10-29 (×2): qty 1

## 2023-10-29 MED ORDER — DOCUSATE SODIUM 100 MG PO CAPS: 100.0000 mg | ORAL_CAPSULE | Freq: Every day | ORAL | Status: DC | PRN

## 2023-10-29 MED ORDER — KETOCONAZOLE 2 % EX CREA: TOPICAL_CREAM | CUTANEOUS | 11 refills | Status: AC

## 2023-10-29 MED ORDER — DULOXETINE HCL 20 MG PO CPEP
20.0000 mg | ORAL_CAPSULE | Freq: Two times a day (BID) | ORAL | Status: DC
  Administered 2023-10-29 – 2023-10-30 (×3): 20 mg via ORAL
  Filled 2023-10-29 (×3): qty 1

## 2023-10-29 MED ORDER — DOXYCYCLINE HYCLATE 100 MG PO TABS
100.0000 mg | ORAL_TABLET | Freq: Two times a day (BID) | ORAL | Status: DC
  Administered 2023-10-29 – 2023-10-30 (×3): 100 mg via ORAL
  Filled 2023-10-29 (×3): qty 1

## 2023-10-29 MED ORDER — DOFETILIDE 125 MCG PO CAPS
125.0000 ug | ORAL_CAPSULE | Freq: Two times a day (BID) | ORAL | Status: DC
  Filled 2023-10-29 (×2): qty 1

## 2023-10-29 MED ORDER — SODIUM CHLORIDE 0.9 % IV SOLN: INTRAVENOUS | Status: DC

## 2023-10-29 MED ORDER — STROKE: EARLY STAGES OF RECOVERY BOOK
Freq: Once | Status: AC
  Filled 2023-10-29: qty 1

## 2023-10-29 NOTE — ED Notes (Addendum)
 MRI contacted me and advised this patient has unsafe pacemaker and is not able to have MRI. Attending notified via secure chat.

## 2023-10-29 NOTE — Plan of Care (Signed)
   Problem: Education: Goal: Knowledge of disease or condition will improve Outcome: Progressing Goal: Knowledge of secondary prevention will improve (MUST DOCUMENT ALL) Outcome: Progressing Goal: Knowledge of patient specific risk factors will improve (DELETE if not current risk factor) Outcome: Progressing

## 2023-10-29 NOTE — Progress Notes (Addendum)
 PROGRESS NOTE    Thomas [name]  FMW:[id number] DOB: 08-Nov-1932 DOA: 10/28/2023 PCP: System, Provider Not In   Brief Narrative: 89 YO pmh S/P pacemaker 2020, moderate mitral regurgitation, aortic insufficiency, heart failure reduced ejection fraction 45%, A-fib on Eliquis , hypertension, CKD stage IIIb, hyperlipidemia, prediabetic, obstructive sleep apnea, left vertebral artery occlusion presents with concern of clamminess of the left foot.  Patient reports noticing some clumsiness of the left foot.   Assessment & Plan:   Principal Problem:   Left-sided weakness Active Problems:   Weakness of left foot   Vertebral artery occlusion, left   S/P placement of cardiac pacemaker   Paroxysmal atrial fibrillation (HCC)   History of TIA (transient ischemic attack)   History of Lyme disease   Essential hypertension   Sick sinus syndrome (HCC)   CKD stage 3b, GFR 30-44 ml/min (HCC)   Obstructive sleep apnea   Combined systolic and diastolic congestive heart failure (HCC)   1-Left LE clumsiness, weakness; Present with worsening LEft LE weakness.  Unable to get MRI brain due to new leads.  Admitted fro TIA work up Plan to proceed with transcranial doppler. And carotid doppler.  CT head: no acute intracranial abnormalities Continue with Eliquis   History of Lyme disease Recent tick exposure 6/24 on the skin-removed - Patient reported that he went to walk outside after that he noticed 1-Click on the right groin and other take on the left foot both has been removed with a tweezer.  Started taking doxycycline  at home.  Reported that every time he notices any take started taking doxycycline  empirically.   -Continue doxycycline  100 mg twice daily to complete 10 days course.   Essential hypertension Combined systolic and diastolic heart failure reduced EF 40 to 45% -Holding home blood pressure regimen in the setting of permissive hypertension.  Continue IV hydralazine  as needed.   Paroxysmal  atrial fibrillation - Continue Tikosyn  125 mcg twice daily. - Continue Eliquis  2.5 mg twice daily   Complete heart block s/p pacemaker -EKG showed AV paced rhythm heart rate 64.   Obstructive sleep apnea -Continue nocturnal oxygen if needed goal to keep O2 sat above 96%   CKD 3B -Stable renal function.     History of prediabetic - Continue carb modified diet.    Estimated body mass index is 24.58 kg/m as calculated from the following:   Height as of this encounter: 5' 5 (1.651 m).   Weight as of this encounter: 67 kg.   DVT prophylaxis: Eliquis  Code Status: Full code Family Communication: Disposition Plan:  Status is: Observation The patient remains OBS appropriate and will d/c before 2 midnights.    Consultants:  Neurology  Procedures:    Antimicrobials:    Subjective: He is feeling better, left side weakness has improved. Feels at baseline  Objective: Vitals:   10/29/23 0630 10/29/23 0645 10/29/23 0700 10/29/23 0733  BP: 139/68 (!) 147/73 (!) 157/61 (!) 151/61  Pulse:    65  Resp: 11 18 19 17   Temp:    (!) 97.3 F (36.3 C)  TempSrc:    Oral  SpO2: 100% 98% 98% 99%  Weight:      Height:       No intake or output data in the 24 hours ending 10/29/23 0831 Filed Weights   10/28/23 2259  Weight: 67 kg    Examination:  General exam: Appears calm and comfortable  Respiratory system: Clear to auscultation. Respiratory effort normal. Cardiovascular system: S1 & S2 heard, RRR. No  JVD, murmurs, rubs, gallops or clicks. No pedal edema. Gastrointestinal system: Abdomen is nondistended, soft and nontender. No organomegaly or masses felt. Normal bowel sounds heard. Central nervous system: Alert and oriented. No focal neurological deficits. Extremities: Symmetric 5 x 5 power. Skin: No rashes, lesions or ulcers Psychiatry: Judgement and insight appear normal. Mood & affect appropriate.     Data Reviewed: I have personally reviewed following labs and  imaging studies  CBC: Recent Labs  Lab 10/28/23 2308 10/28/23 2310  WBC 7.0  --   NEUTROABS 3.2  --   HGB 15.1 16.0  HCT 47.2 47.0  MCV 88.4  --   PLT 183  --    Basic Metabolic Panel: Recent Labs  Lab 10/28/23 2308 10/28/23 2310  NA 139 139  K 4.3 4.3  CL 104 103  CO2 24  --   GLUCOSE 94 91  BUN 39* 42*  CREATININE 1.89* 2.00*  CALCIUM  9.7  --    GFR: Estimated Creatinine Clearance: 21.8 mL/min (A) (by C-G formula based on SCr of 2 mg/dL (H)). Liver Function Tests: Recent Labs  Lab 10/28/23 2308  AST 25  ALT 17  ALKPHOS 78  BILITOT 0.4  PROT 7.1  ALBUMIN  3.7   No results for input(s): LIPASE, AMYLASE in the last 168 hours. No results for input(s): AMMONIA in the last 168 hours. Coagulation Profile: Recent Labs  Lab 10/28/23 2308  INR 1.1   Cardiac Enzymes: No results for input(s): CKTOTAL, CKMB, CKMBINDEX, TROPONINI in the last 168 hours. BNP (last 3 results) No results for input(s): PROBNP in the last 8760 hours. HbA1C: Recent Labs    10/28/23 2308  HGBA1C 6.2*   CBG: Recent Labs  Lab 10/28/23 2256  GLUCAP 103*   Lipid Profile: Recent Labs    10/29/23 0500  CHOL 148  HDL 45  LDLCALC 91  TRIG 59  CHOLHDL 3.3   Thyroid  Function Tests: No results for input(s): TSH, T4TOTAL, FREET4, T3FREE, THYROIDAB in the last 72 hours. Anemia Panel: No results for input(s): VITAMINB12, FOLATE, FERRITIN, TIBC, IRON, RETICCTPCT in the last 72 hours. Sepsis Labs: No results for input(s): PROCALCITON, LATICACIDVEN in the last 168 hours.  No results found for this or any previous visit (from the past 240 hours).       Radiology Studies: CT HEAD CODE STROKE WO CONTRAST Result Date: 10/28/2023 CLINICAL DATA:  Code stroke. EXAM: CT HEAD WITHOUT CONTRAST TECHNIQUE: Contiguous axial images were obtained from the base of the skull through the vertex without intravenous contrast. RADIATION DOSE REDUCTION: This  exam was performed according to the departmental dose-optimization program which includes automated exposure control, adjustment of the mA and/or kV according to patient size and/or use of iterative reconstruction technique. COMPARISON:  Prior study from 05/03/2020 FINDINGS: Brain: Cerebral volume within normal limits for age. Mild chronic microvascular ischemic disease. No acute intracranial hemorrhage. No acute large vessel territory infarct. No mass lesion or midline shift. Mild ventricular prominence related global parenchymal volume loss of hydrocephalus. No extra-axial fluid collection. Vascular: No abnormal hyperdense vessel. Calcified atherosclerosis present at the skull base. Skull: Scalp soft tissues within normal limits.  Calvarium intact. Sinuses/Orbits: Globes and orbital soft tissues within normal limits. Paranasal sinuses and mastoid air cells are largely clear. Other: None. ASPECTS [facility] Stroke Program Early CT Score) - Ganglionic level infarction (caudate, lentiform nuclei, internal capsule, insula, M1-M3 cortex): 7 - Supraganglionic infarction (M4-M6 cortex): 3 Total score (0-10 with 10 being normal): 10 IMPRESSION: 1. No acute intracranial  abnormality. 2. ASPECTS is 10. 3. Mild chronic microvascular ischemic disease for age. These results were communicated to Dr. Jerrie at 11:07 pm on 10/28/2023 by text page via the [facility] messaging system. Electronically Signed   By: Morene Hoard M.D.   On: 10/28/2023 23:09        Scheduled Meds:  [START ON 10/30/2023]  stroke: early stages of recovery book   Does not apply Once   [START ON 10/30/2023]  stroke: early stages of recovery book   Does not apply Once   apixaban   2.5 mg Oral BID   atorvastatin   40 mg Oral Daily   buPROPion   150 mg Oral Daily   dofetilide   125 mcg Oral BID   doxycycline   100 mg Oral Q12H   DULoxetine   20 mg Oral BID   pantoprazole   40 mg Oral Daily   Continuous Infusions:  sodium chloride  40 mL/hr at 10/29/23  0200     LOS: 0 days    Time spent: 35 minutes    Thomas Marcussen A Soren Lazarz, MD [facility]   If 7PM-7AM, please contact night-coverage [email or web address]  10/29/2023, 8:31 AM

## 2023-10-29 NOTE — Care Management Obs Status (Cosign Needed)
 MEDICARE OBSERVATION STATUS NOTIFICATION   Patient Details  Name: Thomas [name] MRN: [id number] Date of Birth: May 04, 1933   Medicare Observation Status Notification Given:  Yes    [name], RN 10/29/2023, 3:37 PM

## 2023-10-29 NOTE — Progress Notes (Signed)
*  PRELIMINARY RESULTS* Echocardiogram 2D Echocardiogram has been performed.  Eva JONETTA Lash 10/29/2023, 8:55 AM

## 2023-10-29 NOTE — Plan of Care (Signed)
  Problem: Education: Goal: Knowledge of disease or condition will improve 10/29/2023 1735 by Rosalva Charlies RAMAN, RN Outcome: Progressing 10/29/2023 1731 by Rosalva Charlies RAMAN, RN Outcome: Progressing Goal: Knowledge of secondary prevention will improve (MUST DOCUMENT ALL) 10/29/2023 1735 by Rosalva Charlies RAMAN, RN Outcome: Progressing 10/29/2023 1731 by Rosalva Charlies RAMAN, RN Outcome: Progressing Goal: Knowledge of patient specific risk factors will improve (DELETE if not current risk factor) 10/29/2023 1735 by Rosalva Charlies RAMAN, RN Outcome: Progressing 10/29/2023 1731 by Rosalva Charlies RAMAN, RN Outcome: Progressing
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
## 2023-10-29 NOTE — ED Notes (Signed)
 Per MRI, due to patient's pacemaker he will have to have MRI done in the morning.
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
## 2023-10-29 NOTE — Progress Notes (Addendum)
 STROKE TEAM PROGRESS NOTE    INTERIM HISTORY/SUBJECTIVE  Patient has remained hemodynamically stable and afebrile.  He is unable to have MRI due to pacemaker incompatibility.  OBJECTIVE  CBC    Component Value Date/Time   WBC 7.0 10/28/2023 2308   RBC 5.34 10/28/2023 2308   HGB 16.0 10/28/2023 2310   HGB 15.4 12/17/2022 1204   HCT 47.0 10/28/2023 2310   HCT 47.5 12/17/2022 1204   PLT 183 10/28/2023 2308   PLT 203 12/17/2022 1204   MCV 88.4 10/28/2023 2308   MCV 90 12/17/2022 1204   MCH 28.3 10/28/2023 2308   MCHC 32.0 10/28/2023 2308   RDW 14.2 10/28/2023 2308   RDW 14.2 12/17/2022 1204   LYMPHSABS 2.7 10/28/2023 2308   LYMPHSABS 1.9 01/09/2022 1425   MONOABS 0.9 10/28/2023 2308   EOSABS 0.2 10/28/2023 2308   EOSABS 0.1 01/09/2022 1425   BASOSABS 0.1 10/28/2023 2308   BASOSABS 0.0 01/09/2022 1425    BMET    Component Value Date/Time   NA 139 10/28/2023 2310   NA 139 01/08/2023 1508   NA 138 06/26/2012 1106   K 4.3 10/28/2023 2310   K 4.4 06/26/2012 1106   CL 103 10/28/2023 2310   CL 105 06/26/2012 1106   CO2 24 10/28/2023 2308   CO2 29 06/26/2012 1106   GLUCOSE 91 10/28/2023 2310   GLUCOSE 105 (H) 06/26/2012 1106   BUN 42 (H) 10/28/2023 2310   BUN 29 (H) 01/08/2023 1508   BUN 16 06/26/2012 1106   CREATININE 2.00 (H) 10/28/2023 2310   CREATININE 1.83 (H) 05/23/2023 1014   CALCIUM  9.7 10/28/2023 2308   CALCIUM  8.9 06/26/2012 1106   EGFR 35 (L) 05/23/2023 1014   EGFR 44 (L) 01/08/2023 1508   GFRNONAA 34 (L) 10/28/2023 2308   GFRNONAA 52 (L) 06/26/2012 1106    IMAGING past 24 hours CT HEAD CODE STROKE WO CONTRAST Result Date: 10/28/2023 CLINICAL DATA:  Code stroke. EXAM: CT HEAD WITHOUT CONTRAST TECHNIQUE: Contiguous axial images were obtained from the base of the skull through the vertex without intravenous contrast. RADIATION DOSE REDUCTION: This exam was performed according to the departmental dose-optimization program which includes automated exposure  control, adjustment of the mA and/or kV according to patient size and/or use of iterative reconstruction technique. COMPARISON:  Prior study from 05/03/2020 FINDINGS: Brain: Cerebral volume within normal limits for age. Mild chronic microvascular ischemic disease. No acute intracranial hemorrhage. No acute large vessel territory infarct. No mass lesion or midline shift. Mild ventricular prominence related global parenchymal volume loss of hydrocephalus. No extra-axial fluid collection. Vascular: No abnormal hyperdense vessel. Calcified atherosclerosis present at the skull base. Skull: Scalp soft tissues within normal limits.  Calvarium intact. Sinuses/Orbits: Globes and orbital soft tissues within normal limits. Paranasal sinuses and mastoid air cells are largely clear. Other: None. ASPECTS [facility] Stroke Program Early CT Score) - Ganglionic level infarction (caudate, lentiform nuclei, internal capsule, insula, M1-M3 cortex): 7 - Supraganglionic infarction (M4-M6 cortex): 3 Total score (0-10 with 10 being normal): 10 IMPRESSION: 1. No acute intracranial abnormality. 2. ASPECTS is 10. 3. Mild chronic microvascular ischemic disease for age. These results were communicated to Dr. Jerrie at 11:07 pm on 10/28/2023 by text page via the [facility] messaging system. Electronically Signed   By: Morene Hoard M.D.   On: 10/28/2023 23:09    Vitals:   10/29/23 0645 10/29/23 0700 10/29/23 0733 10/29/23 1158  BP: (!) 147/73 (!) 157/61 (!) 151/61 (!) 156/69  Pulse:  65 63  Resp: 18 19 17 16   Temp:   (!) 97.3 F (36.3 C)   TempSrc:   Oral   SpO2: 98% 98% 99% 96%  Weight:      Height:         PHYSICAL EXAM General:  Alert, well-nourished, well-developed patient in no acute distress Psych:  Mood and affect appropriate for situation CV: Regular rate and rhythm on monitor Respiratory:  Regular, unlabored respirations on room air   NEURO:  Mental Status: AA&Ox3, patient is able to give clear and coherent  history Speech/Language: speech is without dysarthria or aphasia.    Cranial Nerves:  II: PERRL. Visual fields full.  III, IV, VI: EOMI. Eyelids elevate symmetrically.  V: Sensation is intact to light touch and symmetrical to face.  VII: Face is symmetrical resting and smiling VIII: hearing intact to voice. IX, X: Phonation is normal.  KP:Dynloizm shrug 5/5. XII: tongue is midline without fasciculations. Motor: 5/5 strength to all muscle groups tested.  Right arm orbits left Tone: is normal and bulk is normal Sensation- Intact to light touch bilaterally.  Coordination: FTN intact bilaterally, HKS: Mild ataxia in left leg Gait- deferred  Most Recent NIH  1a Level of Conscious.: 0 1b LOC Questions: 0 1c LOC Commands: 0 2 Best Gaze: 0 3 Visual: 0 4 Facial Palsy: 0 5a Motor Arm - left: 0 5b Motor Arm - Right: 0 6a Motor Leg - Left: 0 6b Motor Leg - Right: 0 7 Limb Ataxia: 1 8 Sensory: 0 9 Best Language: 0 10 Dysarthria: 0 11 Extinct. 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   Possible TIA, etiology uncertain, could be from PAF even on eliquis  vs. Vertebrobasilar insufficiency Code Stroke CT head No acute abnormality. Small vessel disease. ASPECTS 10.    CTA head & neck unable to be performed due to patient's contrast allergy and renal function MRI unable to be performed due to pacemaker leads TCD pending Carotid Doppler pending 2D Echo pending LDL 91 HgbA1c 6.2 UDS negative VTE prophylaxis -fully anticoagulated with Eliquis  Eliquis  (apixaban ) daily prior to admission, now on Eliquis  (apixaban ) daily  Therapy recommendations:   Pending Disposition:  pending  Hx of Stroke/TIA Patient has a history of presentation with transient left-sided numbness in 06/2016, MRI negative for stroke.  MRA head showed left VA occlusion, right VA ends up at PICA.  Carotid Doppler negative.  LDL 56, A1c 5.5.  Subsequently patient was found to have bradycardia, had a pacemaker placed.  5 days later, patient readmitted for left-sided weakness numbness and slurred speech, CT negative.  EF 55 to 60%, LDL 75, A1c 5.5.  Patient continued on Eliquis  and Lipitor 40.  PAF Home Meds: Dofetilide  125 mg twice daily, Eliquis  2.5 mg twice daily, metoprolol  XL 50 mg daily Continue telemetry monitoring Continue dofetilide  and Eliquis   Hypertension Home meds: Metoprolol  XL 50 mg daily Stable Long-term BP goal normotensive  Hyperlipidemia Home meds: Atorvastatin  40 mg daily LDL 91, goal < 70 Increase Lipitor to 80 Continue statin at discharge  Other Stroke Risk Factors Congestive heart failure on Entresto  Obstructive sleep apnea, on CPAP at home  Other Active Problems SSS status post pacemaker  History of Lyme disease with recent Lyme bite, on doxycycline   Hospital day # 0  Patient seen by NP with MD, MD to addend note as needed. Cortney E Everitt Clint Kill , MSN, AGACNP-BC [facility] See Amion for schedule and pager information 10/29/2023 1:46 PM   ATTENDING NOTE: I reviewed above note and agree with the assessment and plan. Pt was seen and examined.   Wife at bedside.  Patient lying in bed, stated that yesterday she had acute onset left foot feeling clumsy and left hand numbness, now resolved.  He did have chronic bilateral lower extremity mild gait disability.  History of similar episode in 06/2016, MRI negative at that time.  Had a pacemaker placed then due to bradycardia.  However, pacemaker leads not compatible with MRI at this time.  Will consider carotid Doppler and TCD.  Not proceed with CTA head and neck given history of  contrast allergy and creatinine 2.00.  Continue Eliquis , increase Lipitor from 40-80.  Will follow.  For detailed assessment and plan, please refer to above as I have made changes wherever appropriate.   Ary Cummins, MD PhD Stroke Neurology 10/29/2023 5:41 PM  I discussed with Dr. Madelyne. I spent additional inpatient 30 minutes face-to-face time with the patient and his wife, discussing about options of MRI and other imaging modalities, reviewing test results, images and medication, and discussing the diagnosis, treatment plan and potential prognosis. This patient's care requiresreview of multiple databases, neurological assessment, discussion with family, other specialists and medical decision making of high complexity.      To contact Stroke Continuity provider, please refer to [email or web address]. After hours, contact General Neurology

## 2023-10-29 NOTE — Progress Notes (Signed)
 Transition of Care [facility]) - Inpatient Brief Assessment   Patient Details  Name: Thomas [name] MRN: [id number] Date of Birth: 1932-05-10  Transition of Care [facility]) CM/SW Contact:    [name], RN Phone Number: 10/29/2023, 3:47 PM   Clinical Narrative: CM met with the patient at the bedside, who is sleeping at this time.  Wife, [name] was present and was provided with [facility] letter at the bedside.  Patient lives with spouse at the home.  Patient is normally independent.  DME at the home includes Bipap machine - RA only.  Wife states that the patient was unable to have an MRI completed since his pacemaker is not compatible with MRI.  Wife states that she called Dr. Marykay office and Dr. Marykay office placed follow up message on chart review with updated message regarding patient's pacemaker compatibility.  I assured the wife that I would pass message onto attending MD - Dr. Madelyne and primary RN - message sent.  No other TOC needs at this time.  CM will continue to follow and support as needed.   Transition of Care Asessment: Insurance and Status: (P) Insurance coverage has been reviewed Patient has primary care physician: (P) Yes Home environment has been reviewed: (P) from home with wife Prior level of function:: (P) Independent Prior/Current Home Services: (P) No current home services Social Drivers of Health Review: (P) SDOH reviewed interventions complete Readmission risk has been reviewed: (P) Yes Transition of care needs: (P) no transition of care needs at this time

## 2023-10-30 ENCOUNTER — Observation Stay (HOSPITAL_COMMUNITY)

## 2023-10-30 ENCOUNTER — Other Ambulatory Visit (HOSPITAL_COMMUNITY)

## 2023-10-30 ENCOUNTER — Other Ambulatory Visit: Payer: Self-pay

## 2023-10-30 DIAGNOSIS — R531 Weakness: Secondary | ICD-10-CM | POA: Diagnosis not present

## 2023-10-30 DIAGNOSIS — I779 Disorder of arteries and arterioles, unspecified: Secondary | ICD-10-CM | POA: Diagnosis not present

## 2023-10-30 DIAGNOSIS — I6502 Occlusion and stenosis of left vertebral artery: Secondary | ICD-10-CM | POA: Diagnosis not present

## 2023-10-30 DIAGNOSIS — Z950 Presence of cardiac pacemaker: Secondary | ICD-10-CM | POA: Diagnosis not present

## 2023-10-30 DIAGNOSIS — I6521 Occlusion and stenosis of right carotid artery: Secondary | ICD-10-CM | POA: Diagnosis not present

## 2023-10-30 DIAGNOSIS — Z8673 Personal history of transient ischemic attack (TIA), and cerebral infarction without residual deficits: Secondary | ICD-10-CM | POA: Diagnosis not present

## 2023-10-30 DIAGNOSIS — Z8619 Personal history of other infectious and parasitic diseases: Secondary | ICD-10-CM | POA: Diagnosis not present

## 2023-10-30 DIAGNOSIS — I2510 Atherosclerotic heart disease of native coronary artery without angina pectoris: Secondary | ICD-10-CM | POA: Diagnosis not present

## 2023-10-30 DIAGNOSIS — Z7901 Long term (current) use of anticoagulants: Secondary | ICD-10-CM | POA: Diagnosis not present

## 2023-10-30 DIAGNOSIS — G4733 Obstructive sleep apnea (adult) (pediatric): Secondary | ICD-10-CM | POA: Diagnosis not present

## 2023-10-30 DIAGNOSIS — I6623 Occlusion and stenosis of bilateral posterior cerebral arteries: Secondary | ICD-10-CM | POA: Diagnosis not present

## 2023-10-30 DIAGNOSIS — I639 Cerebral infarction, unspecified: Secondary | ICD-10-CM

## 2023-10-30 DIAGNOSIS — I739 Peripheral vascular disease, unspecified: Secondary | ICD-10-CM

## 2023-10-30 DIAGNOSIS — R9082 White matter disease, unspecified: Secondary | ICD-10-CM | POA: Diagnosis not present

## 2023-10-30 DIAGNOSIS — R9089 Other abnormal findings on diagnostic imaging of central nervous system: Secondary | ICD-10-CM | POA: Diagnosis not present

## 2023-10-30 DIAGNOSIS — G319 Degenerative disease of nervous system, unspecified: Secondary | ICD-10-CM | POA: Diagnosis not present

## 2023-10-30 DIAGNOSIS — I130 Hypertensive heart and chronic kidney disease with heart failure and stage 1 through stage 4 chronic kidney disease, or unspecified chronic kidney disease: Secondary | ICD-10-CM | POA: Diagnosis not present

## 2023-10-30 DIAGNOSIS — E785 Hyperlipidemia, unspecified: Secondary | ICD-10-CM

## 2023-10-30 DIAGNOSIS — G459 Transient cerebral ischemic attack, unspecified: Secondary | ICD-10-CM | POA: Diagnosis not present

## 2023-10-30 DIAGNOSIS — I480 Paroxysmal atrial fibrillation: Secondary | ICD-10-CM | POA: Diagnosis not present

## 2023-10-30 DIAGNOSIS — N1832 Chronic kidney disease, stage 3b: Secondary | ICD-10-CM | POA: Diagnosis not present

## 2023-10-30 DIAGNOSIS — I5040 Unspecified combined systolic (congestive) and diastolic (congestive) heart failure: Secondary | ICD-10-CM | POA: Diagnosis not present

## 2023-10-30 DIAGNOSIS — I495 Sick sinus syndrome: Secondary | ICD-10-CM | POA: Diagnosis not present

## 2023-10-30 LAB — BASIC METABOLIC PANEL WITH GFR
Anion gap: 19 — ABNORMAL HIGH (ref 5–15)
BUN: 31 mg/dL — ABNORMAL HIGH (ref 8–23)
CO2: 21 mmol/L — ABNORMAL LOW (ref 22–32)
Calcium: 9.9 mg/dL (ref 8.9–10.3)
Chloride: 99 mmol/L (ref 98–111)
Creatinine, Ser: 1.69 mg/dL — ABNORMAL HIGH (ref 0.61–1.24)
GFR, Estimated: 38 mL/min — ABNORMAL LOW (ref >60)
Glucose, Bld: 97 mg/dL (ref 70–99)
Potassium: 4 mmol/L (ref 3.5–5.1)
Sodium: 139 mmol/L (ref 135–145)

## 2023-10-30 MED ORDER — SODIUM BICARBONATE 650 MG PO TABS
650.0000 mg | ORAL_TABLET | Freq: Two times a day (BID) | ORAL | 0 refills | Status: AC
Start: 2023-10-30 — End: 2023-11-09

## 2023-10-30 MED ORDER — SODIUM BICARBONATE 650 MG PO TABS: 650.0000 mg | ORAL_TABLET | Freq: Two times a day (BID) | ORAL | Status: DC

## 2023-10-30 MED ORDER — ATORVASTATIN CALCIUM 80 MG PO TABS: 80.0000 mg | ORAL_TABLET | Freq: Every day | ORAL | 1 refills | Status: DC

## 2023-10-30 NOTE — Plan of Care (Signed)

## 2023-10-30 NOTE — Progress Notes (Addendum)
 STROKE TEAM PROGRESS NOTE    INTERIM HISTORY/SUBJECTIVE  Patient is seen in his room with his wife at the bedside.  He has been hemodynamically stable and afebrile overnight.  While his pacemaker and leads to have an unusual configuration they are, in fact, MRI compatible.  Will proceed with MRI and MRA today.  OBJECTIVE  CBC    Component Value Date/Time   WBC 7.0 10/28/2023 2308   RBC 5.34 10/28/2023 2308   HGB 16.0 10/28/2023 2310   HGB 15.4 12/17/2022 1204   HCT 47.0 10/28/2023 2310   HCT 47.5 12/17/2022 1204   PLT 183 10/28/2023 2308   PLT 203 12/17/2022 1204   MCV 88.4 10/28/2023 2308   MCV 90 12/17/2022 1204   MCH 28.3 10/28/2023 2308   MCHC 32.0 10/28/2023 2308   RDW 14.2 10/28/2023 2308   RDW 14.2 12/17/2022 1204   LYMPHSABS 2.7 10/28/2023 2308   LYMPHSABS 1.9 01/09/2022 1425   MONOABS 0.9 10/28/2023 2308   EOSABS 0.2 10/28/2023 2308   EOSABS 0.1 01/09/2022 1425   BASOSABS 0.1 10/28/2023 2308   BASOSABS 0.0 01/09/2022 1425    BMET    Component Value Date/Time   NA 139 10/30/2023 0801   NA 139 01/08/2023 1508   NA 138 06/26/2012 1106   K 4.0 10/30/2023 0801   K 4.4 06/26/2012 1106   CL 99 10/30/2023 0801   CL 105 06/26/2012 1106   CO2 21 (L) 10/30/2023 0801   CO2 29 06/26/2012 1106   GLUCOSE 97 10/30/2023 0801   GLUCOSE 105 (H) 06/26/2012 1106   BUN 31 (H) 10/30/2023 0801   BUN 29 (H) 01/08/2023 1508   BUN 16 06/26/2012 1106   CREATININE 1.69 (H) 10/30/2023 0801   CREATININE 1.83 (H) 05/23/2023 1014   CALCIUM  9.9 10/30/2023 0801   CALCIUM  8.9 06/26/2012 1106   EGFR 35 (L) 05/23/2023 1014   EGFR 44 (L) 01/08/2023 1508   GFRNONAA 38 (L) 10/30/2023 0801   GFRNONAA 52 (L) 06/26/2012 1106    IMAGING past 24 hours No results found.   Vitals:   10/29/23 2101 10/30/23 0001 10/30/23 0349 10/30/23 0741  BP: (!) 144/60 (!) 158/74 (!) 150/68 (!) 141/63  Pulse: 84 60 63 65  Resp: 20 19 20 18   Temp: (!) 97.4 F (36.3 C) 98.1 F (36.7 C) 97.8 F (36.6  C) 97.8 F (36.6 C)  TempSrc:  Oral Oral Oral  SpO2: (!) 69% 99% 97% 98%  Weight:      Height:         PHYSICAL EXAM General:  Alert, well-nourished, well-developed patient in no acute distress Psych:  Mood and affect appropriate for situation CV: Regular rate and rhythm on monitor Respiratory:  Regular, unlabored respirations on room air   NEURO:  Mental Status: AA&Ox3, patient is able to give clear and coherent history Speech/Language: speech is without dysarthria or aphasia.    Cranial Nerves:  II: PERRL. Visual fields full.  III, IV, VI: EOMI. Eyelids elevate symmetrically.  VII: Face is symmetrical resting and smiling VIII: hearing intact to voice. IX, X: Phonation is normal.  XII: tongue is midline without fasciculations. Motor: Able to move all 4 extremities with good antigravity strength Tone: is normal and bulk is normal Sensation- Intact to light touch bilaterally.    Most Recent NIH  1a Level of Conscious.: 0 1b LOC Questions: 0 1c LOC Commands: 0 2 Best Gaze: 0 3 Visual: 0 4 Facial Palsy: 0 5a Motor Arm - left:  0 5b Motor Arm - Right: 0 6a Motor Leg - Left: 0 6b Motor Leg - Right: 0 7 Limb Ataxia: 1 8 Sensory: 0 9 Best Language: 0 10 Dysarthria: 0 11 Extinct. 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   Possible TIA, etiology uncertain, could be from PAF even on eliquis  vs. Large vessel disease source Code Stroke CT head No acute abnormality. Small vessel disease. ASPECTS 10.    CTA head & neck unable to be performed due to patient's contrast allergy and renal function MRI no acute infarct MRA left VA patent, R VA ends at PICA. Some progression of left P2  stenosis and R P2/P3 stenosis comparing with 2018.  Carotid Doppler unremarkable 2D Echo EF 55 to 60%, grade 1 diastolic dysfunction, normal left atrial size, aortic dilatation noted, no atrial level shunt LDL 91 HgbA1c 6.2 UDS negative VTE prophylaxis -fully anticoagulated with Eliquis  Eliquis  (apixaban ) daily prior to admission, now on Eliquis  (apixaban ) daily  Therapy recommendations: none Disposition: home today  Hx of Stroke/TIA Patient has a history of presentation with transient left-sided numbness in 06/2016, MRI negative for stroke.  MRA head showed right VA ends up at PICA.  Carotid Doppler negative.  LDL 56, A1c 5.5.  Subsequently patient was found to have bradycardia, had a pacemaker placed.  5 days later, patient readmitted for left-sided weakness numbness and slurred speech, CT negative.  EF 55 to 60%, LDL 75, A1c 5.5.  Patient continued on Eliquis  and Lipitor 40.  PAF Home Meds: Dofetilide  125 mg twice daily, Eliquis  2.5 mg twice daily, metoprolol  XL 50 mg daily Continue telemetry monitoring Continue dofetilide  and Eliquis   Hypertension Home meds: Metoprolol  XL 50 mg daily Stable Long-term BP goal normotensive  Hyperlipidemia Home meds: Atorvastatin  40 mg daily LDL 91, goal < 70 Increase Lipitor to 80 Continue statin at discharge  Other Stroke Risk Factors Congestive heart failure on Entresto  Obstructive sleep apnea, on CPAP at home  Other Active Problems SSS status post pacemaker History of Lyme disease with recent tick bite, on doxycycline   Hospital day # 0  Patient seen by NP with MD, MD to addend note as needed. Cortney E Everitt Clint Kill , MSN, AGACNP-BC [facility] See Amion for schedule and pager information 10/30/2023 10:25 AM   ATTENDING NOTE: I reviewed above note and agree with the assessment and plan. Pt was seen and examined.   Wife at the bedside, pt sitting at the edge of bed, doing well, neuro at baseline. PT has signed off and no  recommendations. MRI was able to be done today and showed no acute infarct. MRA showed patent left VA and R VA ends at PICA. B/l PCA has some progressive stenosis. Currently on home eliquis  and stain increased to 80. Follow up with [facility] neurology on 01/07/24.  For detailed assessment and plan, please refer to above as I have made changes wherever appropriate.   Neurology will sign off. Please call with questions. Pt will follow up with [facility] neurology on 01/07/24. Thanks for the consult.   Ary Cummins, MD PhD Stroke Neurology 10/30/2023 2:12 PM    To contact Stroke Continuity provider, please refer to [email or web address]. After hours, contact General Neurology

## 2023-10-30 NOTE — Discharge Summary (Signed)
 Physician Discharge Summary   Patient: Thomas [name] MRN: [id number] DOB: 1933-04-02  Admit date:     10/28/2023  Discharge date: 10/30/23  Discharge Physician: Owen DELENA Lore   PCP: System, Provider Not In   Recommendations at discharge:   Needs to follow up at [facility] 9/0/2025 Needs follow up renal function.   Discharge Diagnoses: Principal Problem:   Left-sided weakness Active Problems:   Weakness of left foot   Vertebral artery occlusion, left   S/P placement of cardiac pacemaker   Paroxysmal atrial fibrillation (HCC)   History of TIA (transient ischemic attack)   History of Lyme disease   Essential hypertension   Sick sinus syndrome (HCC)   CKD stage 3b, GFR 30-44 ml/min (HCC)   Obstructive sleep apnea   Combined systolic and diastolic congestive heart failure (HCC)  Resolved Problems:   * No resolved hospital problems. [facility] Course: 89 YO pmh S/P pacemaker 2020, moderate mitral regurgitation, aortic insufficiency, heart failure reduced ejection fraction 45%, A-fib on Eliquis , hypertension, CKD stage IIIb, hyperlipidemia, prediabetic, obstructive sleep apnea, left vertebral artery occlusion presents with concern of clamminess of the left foot.  Patient reports noticing some clumsiness of the left foot.    Assessment and Plan: 1-Left LE clumsiness, weakness; Present with worsening LEft LE weakness.  Pacemake and leads are compatible with MRI.  Admitted fro TIA work up  CT head: no acute intracranial abnormalities Continue with Eliquis  MRI MRA: negative for Stroke.  He could have had TIA>   History of Lyme disease Recent tick exposure 6/24 on the skin-removed - Patient reported that he went to walk outside after that he noticed 1-Click on the right groin and other take on the left foot both has been removed with a tweezer.  Started taking doxycycline  at home.  Reported that every time he notices any take started taking doxycycline  empirically.   -Continue  doxycycline  100 mg twice daily to complete 10 days course.   Essential hypertension Combined systolic and diastolic heart failure reduced EF 40 to 45% -Continue IV hydralazine  as needed.  -Resume entresto . Close follow up renal function.   Paroxysmal atrial fibrillation - Continue Tikosyn  125 mcg twice daily. - Continue Eliquis  2.5 mg twice daily   Complete heart block s/p pacemaker -EKG showed AV paced rhythm heart rate 64.   Obstructive sleep apnea -Continue nocturnal oxygen if needed goal to keep O2 sat above 96%   CKD 3B -Stable renal function.   -Cr down to 1.6 -Bicarb down to 21--will provide 10 days of sodium bicarb table.    History of prediabetic - Continue carb modified diet.            Consultants: Neurology  Disposition: Home Diet recommendation:  Cardiac diet DISCHARGE MEDICATION: Allergies as of 10/30/2023       Reactions   Other Other (See Comments)   Vicryl sutures - patient states that site becomes soupy   Baclofen    Severe delirium    Iodinated Contrast Media Hives   Iodine Hives, Other (See Comments)   IVP contrast   Penicillins Itching, Rash, Other (See Comments)   ITCHY FEELING IN FINGERS; Tolerated Zosyn  04/2023        Medication List     STOP taking these medications    colchicine  0.6 MG tablet   gabapentin  300 MG capsule Commonly known as: NEURONTIN    hydrALAZINE  25 MG tablet Commonly known as: APRESOLINE    methocarbamol  500 MG tablet Commonly known as: ROBAXIN   TAKE these medications    2-3CC SYRINGE 3 ML Misc 1 mg by Does not apply route every 14 (fourteen) days.   acetaminophen  500 MG tablet Commonly known as: TYLENOL  Take 500-1,000 mg by mouth every 6 (six) hours as needed for mild pain (pain score 1-3) or fever.   apixaban  2.5 MG Tabs tablet Commonly known as: Eliquis  Take 1 tablet (2.5 mg total) by mouth 2 (two) times daily.   Artificial Tears ophthalmic solution Place 1 drop into both eyes in  the morning, at noon, in the evening, and at bedtime.   atorvastatin  80 MG tablet Commonly known as: LIPITOR Take 1 tablet (80 mg total) by mouth daily. Start taking on: October 31, 2023 What changed:  medication strength how much to take   BD Disp Needles 18G X 1-1/2 Misc Generic drug: NEEDLE (DISP) 18 G 1 mg by Does not apply route every 14 (fourteen) days.   BD Disp Needles 21G X 1-1/2 Misc Generic drug: NEEDLE (DISP) 21 G 1 mg by Does not apply route every 14 (fourteen) days.   buPROPion  150 MG 24 hr tablet Commonly known as: WELLBUTRIN  XL Take 150 mg by mouth daily.   calcium  carbonate 500 MG chewable tablet Commonly known as: TUMS - dosed in mg elemental calcium  Chew 1-2 tablets by mouth daily as needed for indigestion.   CoQ10 100 MG Caps Take 100 mg by mouth daily.   docusate sodium  100 MG capsule Commonly known as: COLACE Take 100 mg by mouth daily as needed for mild constipation.   dofetilide  125 MCG capsule Commonly known as: TIKOSYN  TAKE 1 CAPSULE BY MOUTH TWICE DAILY   doxycycline  100 MG tablet Commonly known as: VIBRA -TABS Take 100 mg by mouth daily.   DULoxetine  20 MG capsule Commonly known as: CYMBALTA  Take 20 mg by mouth 2 (two) times daily.   febuxostat  40 MG tablet Commonly known as: ULORIC  Take 1 tablet (40 mg total) by mouth daily.   isosorbide  mononitrate 30 MG 24 hr tablet Commonly known as: IMDUR  Take 0.5 tablets (15 mg total) by mouth daily.   ketoconazole 2 % cream Commonly known as: NIZORAL Apply to the feet every night to prevent fungal infection.   meclizine 12.5 MG tablet Commonly known as: ANTIVERT Take 12.5 mg by mouth 3 (three) times daily.   metoprolol  succinate 50 MG 24 hr tablet Commonly known as: TOPROL -XL TAKE 1 TABLET BY MOUTH DAILY WITH OR IMMEDIATELY FOLLOWING A MEAL What changed:  how much to take how to take this when to take this additional instructions   metroNIDAZOLE  500 MG tablet Commonly known as:  FLAGYL  TAKE 1 TABLET BY MOUTH TWICE DAILY ON SUNDAY AND MONDAY.   nitroGLYCERIN  0.4 MG SL tablet Commonly known as: NITROSTAT  Place 1 tablet (0.4 mg total) under the tongue every 5 (five) minutes as needed for chest pain.   omeprazole 20 MG capsule Commonly known as: PRILOSEC Take 20 mg by mouth daily as needed (for heartburn- 30 minutes before a meal).   sacubitril -valsartan  24-26 MG Commonly known as: ENTRESTO  Take 1 tablet by mouth 2 (two) times daily.   sodium bicarbonate 650 MG tablet Take 1 tablet (650 mg total) by mouth 2 (two) times daily for 10 days.   terbinafine  250 MG tablet Commonly known as: LAMISIL  Take 1 tablet (250 mg total) by mouth daily.   Testosterone  1.62 % Gel APPLY 2 PUMPS DAILY   testosterone  cypionate 200 MG/ML injection Commonly known as: DEPOTESTOSTERONE CYPIONATE INJECT 1 ML (200  MG TOTAL) INTO THE MUSCLE EVERY 28 DAYS        Follow-up Information     Inc, [facility]. Schedule an appointment as soon as possible for a visit in 1 week(s).   Contact information: [address] [facility] [address] [place] [address] [phone]         Darcus Panning, MD. Go on 01/07/2024.   Specialty: Neurology Contact information: [address] [place] [employer] [address] [phone]                Discharge Exam: Filed Weights   10/28/23 2259  Weight: 67 kg   General; NAD  Condition at discharge: stable  The results of significant diagnostics from this hospitalization (including imaging, microbiology, ancillary and laboratory) are listed below for reference.   Imaging Studies: MR BRAIN WO CONTRAST Result Date: 10/30/2023 CLINICAL DATA:  Stroke follow-up. EXAM: MRI HEAD WITHOUT CONTRAST TECHNIQUE: Multiplanar, multiecho pulse sequences of the brain and surrounding structures were obtained without intravenous contrast. COMPARISON:  CT of the head dated October 28, 2023. FINDINGS: Brain: There is no  restricted diffusion to indicate acute or recent infarction. There is a moderate amount of generalized cerebral volume loss present. There is no evidence of hemorrhage, mass, cortical infarction or hydrocephalus. There is mild periventricular white matter disease. Vascular: Normal vascular flow voids. Skull and upper cervical spine: Normal marrow signal. Sinuses/Orbits: No acute process. Other: None. IMPRESSION: 1. Age-related atrophy and mild periventricular white matter disease. No apparent acute process. Electronically Signed   By: Evalene Coho M.D.   On: 10/30/2023 15:00   MR ANGIO HEAD WO CONTRAST Result Date: 10/30/2023 CLINICAL DATA:  Stroke, follow up EXAM: MRA HEAD WITHOUT CONTRAST TECHNIQUE: Angiographic images of the Circle of Willis were acquired using MRA technique without intravenous contrast. COMPARISON:  CT of the head dated October 28, 2023. FINDINGS: Anterior circulation: There is mild to moderate stenosis of the right ICA terminus, proximally 30-40%. The left internal carotid artery is patent. There is mild stenosis of the distal M1 segment of the left middle cerebral artery. The M2 and M3 branches appeared normal in caliber bilaterally. The anterior cerebral arteries and their branches are normal in caliber. No evidence of aneurysm or large vessel occlusion. Posterior circulation: The left vertebral artery is dominant. The right vertebral artery is hypoplastic and appears to terminate in the posteroinferior cerebellar artery. There is mild to moderate stenosis of the proximal P2 segment of the left posterior cerebral artery. There is also mild to moderate stenosis of the distal P2 segment of the right posterior cerebral artery. The cerebellar arteries are patent. Anatomic variants: None. Other: None. IMPRESSION: 1. Mild to moderate stenoses of the P2 segments of the posterior cerebral arteries bilaterally. 2. Mild stenosis of the right ICA terminus and the distal left M1 segment.  Electronically Signed   By: Evalene Coho M.D.   On: 10/30/2023 14:55   VAS US  CAROTID Result Date: 10/30/2023 Carotid Arterial Duplex Study Patient Name:  Thomas [name] [facility]  Date of Exam:   10/30/2023 Medical Rec #: [id number]      Accession #:    [id number] Date of Birth: December 01, 1932      Patient Gender: M Patient Age:   87 years Exam Location:  [facility] Procedure:      VAS US  CAROTID Referring Phys: EARLE DE LA TORRE --------------------------------------------------------------------------------  Indications:       CVA. Risk Factors:  Hypertension, hyperlipidemia, no history of smoking, coronary                    artery disease, prior CVA. Other Factors:     Afib, PM, CHF, CKD, TIA. Comparison Study:  Previous exam on 06/15/2016 was WNL Performing Technologist: Ezzie Potters RVT, RDMS  Examination Guidelines: A complete evaluation includes B-mode imaging, spectral Doppler, color Doppler, and power Doppler as needed of all accessible portions of each vessel. Bilateral testing is considered an integral part of a complete examination. Limited examinations for reoccurring indications may be performed as noted.  Right Carotid Findings: +----------+--------+--------+--------+------------------+------------------+           PSV cm/sEDV cm/sStenosisPlaque DescriptionComments           +----------+--------+--------+--------+------------------+------------------+ CCA Prox  73      10                                intimal thickening +----------+--------+--------+--------+------------------+------------------+ CCA Distal70      9                                 intimal thickening +----------+--------+--------+--------+------------------+------------------+ ICA Prox  59      12              focal and calcific                   +----------+--------+--------+--------+------------------+------------------+ ICA Distal75      21                                                    +----------+--------+--------+--------+------------------+------------------+ ECA       58      0                                                    +----------+--------+--------+--------+------------------+------------------+ +----------+--------+-------+----------------+-------------------+           PSV cm/sEDV cmsDescribe        Arm Pressure (mmHG) +----------+--------+-------+----------------+-------------------+ [id number]            Multiphasic, WNL                    +----------+--------+-------+----------------+-------------------+ +---------+--------+--+--------+-+----------------------------+ VertebralPSV cm/s35EDV cm/s0Antegrade and High resistant +---------+--------+--+--------+-+----------------------------+  Left Carotid Findings: +----------+--------+--------+--------+------------------+------------------+           PSV cm/sEDV cm/sStenosisPlaque DescriptionComments           +----------+--------+--------+--------+------------------+------------------+ CCA Prox  108     13                                intimal thickening +----------+--------+--------+--------+------------------+------------------+ CCA Mid                                             intimal thickening +----------+--------+--------+--------+------------------+------------------+ CCA Distal92      13  focal and calcific                   +----------+--------+--------+--------+------------------+------------------+ ICA Prox  53      14                                                   +----------+--------+--------+--------+------------------+------------------+ ICA Distal78      23                                                   +----------+--------+--------+--------+------------------+------------------+ ECA       59      0                                                     +----------+--------+--------+--------+------------------+------------------+ +----------+--------+--------+----------------+-------------------+           PSV cm/sEDV cm/sDescribe        Arm Pressure (mmHG) +----------+--------+--------+----------------+-------------------+ [id number]             Multiphasic, WNL                    +----------+--------+--------+----------------+-------------------+ +---------+--------+--+--------+--+---------+ VertebralPSV cm/s36EDV cm/s10Antegrade +---------+--------+--+--------+--+---------+      Preliminary    ECHOCARDIOGRAM COMPLETE Result Date: 10/29/2023    ECHOCARDIOGRAM REPORT   Patient Name:   Thomas [name] [place] Date of Exam: 10/29/2023 Medical Rec #:  [id number]     Height:       65.0 in Accession #:    [id number]    Weight:       147.7 lb Date of Birth:  Apr 04, 1933     BSA:          1.739 m Patient Age:    89 years      BP:           134/98 mmHg Patient Gender: M             HR:           60 bpm. Exam Location:  Inpatient Procedure: Color Doppler and Cardiac Doppler (Both Spectral and Color Flow            Doppler were utilized during procedure). Indications:    TIA  History:        Patient has prior history of Echocardiogram examinations, most                 recent 07/15/2023.  Sonographer:    Eva Lash Referring Phys: [id number] SUBRINA SUNDIL IMPRESSIONS  1. Left ventricular ejection fraction, by estimation, is 55 to 60%. The left ventricle has normal function. The left ventricle has no regional wall motion abnormalities. Left ventricular diastolic parameters are consistent with Grade I diastolic dysfunction (impaired relaxation).  2. Right ventricular systolic function is normal. The right ventricular size is normal.  3. The mitral valve is normal in structure. Trivial mitral valve regurgitation. No evidence of mitral stenosis.  4. The aortic valve is calcified. Aortic valve regurgitation is mild to moderate. No aortic stenosis is present. Aortic  valve mean gradient measures 7.0 mmHg. Aortic  valve Vmax measures 1.92 m/s.  5. Aortic dilatation noted. There is dilatation of the ascending aorta, measuring 42 mm.  6. The inferior vena cava is normal in size with greater than 50% respiratory variability, suggesting right atrial pressure of 3 mmHg. FINDINGS  Left Ventricle: Left ventricular ejection fraction, by estimation, is 55 to 60%. The left ventricle has normal function. The left ventricle has no regional wall motion abnormalities. The left ventricular internal cavity size was normal in size. There is  no left ventricular hypertrophy. Left ventricular diastolic parameters are consistent with Grade I diastolic dysfunction (impaired relaxation). Right Ventricle: The right ventricular size is normal. No increase in right ventricular wall thickness. Right ventricular systolic function is normal. Left Atrium: Left atrial size was normal in size. Right Atrium: Right atrial size was normal in size. Pericardium: There is no evidence of pericardial effusion. Mitral Valve: The mitral valve is normal in structure. Trivial mitral valve regurgitation. No evidence of mitral valve stenosis. Tricuspid Valve: The tricuspid valve is normal in structure. Tricuspid valve regurgitation is mild . No evidence of tricuspid stenosis. Aortic Valve: The aortic valve is calcified. Aortic valve regurgitation is mild to moderate. Aortic regurgitation PHT measures 571 msec. No aortic stenosis is present. Aortic valve mean gradient measures 7.0 mmHg. Aortic valve peak gradient measures 14.7  mmHg. Aortic valve area, by VTI measures 3.03 cm. Pulmonic Valve: The pulmonic valve was normal in structure. Pulmonic valve regurgitation is not visualized. No evidence of pulmonic stenosis. Aorta: Aortic dilatation noted. There is dilatation of the ascending aorta, measuring 42 mm. Venous: The inferior vena cava is normal in size with greater than 50% respiratory variability, suggesting right atrial  pressure of 3 mmHg. IAS/Shunts: No atrial level shunt detected by color flow Doppler.  LEFT VENTRICLE PLAX 2D LVIDd:         5.30 cm      Diastology LVIDs:         4.00 cm      LV e' medial:    3.81 cm/s LV PW:         0.90 cm      LV E/e' medial:  15.5 LV IVS:        1.00 cm      LV e' lateral:   5.55 cm/s LVOT diam:     2.60 cm      LV E/e' lateral: 10.6 LV SV:         130 LV SV Index:   75 LVOT Area:     5.31 cm  LV Volumes (MOD) LV vol d, MOD A2C: 117.0 ml LV vol d, MOD A4C: 120.0 ml LV vol s, MOD A2C: 54.3 ml LV vol s, MOD A4C: 63.0 ml LV SV MOD A2C:     62.7 ml LV SV MOD A4C:     120.0 ml LV SV MOD BP:      59.1 ml RIGHT VENTRICLE RV S prime:     8.59 cm/s TAPSE (M-mode): 1.5 cm LEFT ATRIUM             Index LA diam:        4.40 cm 2.53 cm/m LA Vol (A2C):   42.6 ml 24.50 ml/m LA Vol (A4C):   53.5 ml 30.76 ml/m LA Biplane Vol: 51.3 ml 29.50 ml/m  AORTIC VALVE AV Area (Vmax):    2.37 cm AV Area (Vmean):   2.21 cm AV Area (VTI):     3.03 cm AV Vmax:  192.00 cm/s AV Vmean:          122.000 cm/s AV VTI:            0.429 m AV Peak Grad:      14.7 mmHg AV Mean Grad:      7.0 mmHg LVOT Vmax:         85.80 cm/s LVOT Vmean:        50.800 cm/s LVOT VTI:          0.245 m LVOT/AV VTI ratio: 0.57 AI PHT:            571 msec  AORTA Ao Asc diam: 4.20 cm MITRAL VALVE                TRICUSPID VALVE MV Area (PHT): 2.28 cm     TR Peak grad:   30.5 mmHg MV Decel Time: 333 msec     TR Vmax:        276.00 cm/s MV E velocity: 58.90 cm/s MV A velocity: 127.00 cm/s  SHUNTS MV E/A ratio:  0.46         Systemic VTI:  0.24 m                             Systemic Diam: 2.60 cm Aditya Sabharwal Electronically signed by Ria Commander Signature Date/Time: 10/29/2023/4:41:51 PM    Final    CT HEAD CODE STROKE WO CONTRAST Result Date: 10/28/2023 CLINICAL DATA:  Code stroke. EXAM: CT HEAD WITHOUT CONTRAST TECHNIQUE: Contiguous axial images were obtained from the base of the skull through the vertex without intravenous  contrast. RADIATION DOSE REDUCTION: This exam was performed according to the departmental dose-optimization program which includes automated exposure control, adjustment of the mA and/or kV according to patient size and/or use of iterative reconstruction technique. COMPARISON:  Prior study from 05/03/2020 FINDINGS: Brain: Cerebral volume within normal limits for age. Mild chronic microvascular ischemic disease. No acute intracranial hemorrhage. No acute large vessel territory infarct. No mass lesion or midline shift. Mild ventricular prominence related global parenchymal volume loss of hydrocephalus. No extra-axial fluid collection. Vascular: No abnormal hyperdense vessel. Calcified atherosclerosis present at the skull base. Skull: Scalp soft tissues within normal limits.  Calvarium intact. Sinuses/Orbits: Globes and orbital soft tissues within normal limits. Paranasal sinuses and mastoid air cells are largely clear. Other: None. ASPECTS [facility] Stroke Program Early CT Score) - Ganglionic level infarction (caudate, lentiform nuclei, internal capsule, insula, M1-M3 cortex): 7 - Supraganglionic infarction (M4-M6 cortex): 3 Total score (0-10 with 10 being normal): 10 IMPRESSION: 1. No acute intracranial abnormality. 2. ASPECTS is 10. 3. Mild chronic microvascular ischemic disease for age. These results were communicated to Dr. Jerrie at 11:07 pm on 10/28/2023 by text page via the [facility] messaging system. Electronically Signed   By: Morene Hoard M.D.   On: 10/28/2023 23:09    Microbiology: Results for orders placed or performed in visit on 05/23/23  Blood culture (routine single)     Status: None   Collection Time: 05/23/23 10:14 AM   Specimen: Blood  Result Value Ref Range Status   MICRO NUMBER: [id number]  Final   SPECIMEN QUALITY: Adequate  Final   Source BLOOD 1  Final   STATUS: FINAL  Final   Result: No growth after 5 days  Final   COMMENT: Aerobic and anaerobic bottle received.  Final  Blood  culture (routine single)     Status: None  Collection Time: 05/23/23 10:20 AM   Specimen: Blood  Result Value Ref Range Status   MICRO NUMBER: [id number]  Final   SPECIMEN QUALITY: Adequate  Final   Source BLOOD 2  Final   STATUS: FINAL  Final   Result: No growth after 5 days  Final   COMMENT: Aerobic and anaerobic bottle received.  Final    Labs: CBC: Recent Labs  Lab 10/28/23 2308 10/28/23 2310  WBC 7.0  --   NEUTROABS 3.2  --   HGB 15.1 16.0  HCT 47.2 47.0  MCV 88.4  --   PLT 183  --    Basic Metabolic Panel: Recent Labs  Lab 10/28/23 2308 10/28/23 2310 10/30/23 0801  NA 139 139 139  K 4.3 4.3 4.0  CL 104 103 99  CO2 24  --  21*  GLUCOSE 94 91 97  BUN 39* 42* 31*  CREATININE 1.89* 2.00* 1.69*  CALCIUM  9.7  --  9.9   Liver Function Tests: Recent Labs  Lab 10/28/23 2308  AST 25  ALT 17  ALKPHOS 78  BILITOT 0.4  PROT 7.1  ALBUMIN  3.7   CBG: Recent Labs  Lab 10/28/23 2256  GLUCAP 103*    Discharge time spent: greater than 30 minutes.  Signed: Owen DELENA Lore, MD [facility] 10/30/2023

## 2023-10-30 NOTE — Evaluation (Addendum)
 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 Head CT (-). PMhx: PAF on Eliquis , HTN, HLD, prediabetes, OSA on CPAP, dominant left vertebral artery occlusion, TIA, SSS s/p PPM, BPH, renal CA, lumbar fusion     Clinical Impressions Pt presents ay Ind - Mod I baseline level of function with ADLs and ADL mobility. PTA pt lives with hsi wife and used no AD for mobility (pt is retired Careers adviser, urology). Pt transferred on/off commode and stepped in/out of shower without difficulty or LOB, used grab bar for safety. Pt reports hx of L hand coordination/dexterity impairments, drops things. Pt and his wife instructed on FMC/dexterity exercies for L hand with handout provided, pt verbalized understanding. All education completed and no further acute OT services are indicated at this time. Pt planning for MRI this afternoon, then hoping to d/c home. OT will sign off    If plan is discharge home, recommend the following:   Assistance with cooking/housework     Functional Status Assessment   Patient has not had a recent decline in their functional status     Equipment Recommendations   Tub/shower seat;Other (comment) (shower grab bars)     Recommendations for Other Services         Precautions/Restrictions   Precautions Precautions: None Restrictions Weight Bearing Restrictions Per Provider Order: No     Mobility Bed Mobility Overal bed mobility: Independent                  Transfers Overall transfer level: Modified independent Equipment used: 1 person hand held assist               General transfer comment: pt reports slower, guarded pace during walk to bathroom with OT      Balance Overall balance assessment: Mild deficits observed, not  formally tested                                         ADL either performed or assessed with clinical judgement   ADL Overall ADL's : Independent;Modified independent;At baseline                                       General ADL Comments: Pt transferred on/off commode and  stepped in/out of shower without difficulty or LOB, used grab bar for safety     Vision Ability to See in Adequate Light: 0 Adequate Patient Visual Report: No change from baseline       Perception         Praxis         Pertinent Vitals/Pain Pain Assessment Pain Assessment: No/denies pain     Extremity/Trunk Assessment Upper Extremity Assessment Upper Extremity Assessment: Right hand dominant;LUE deficits/detail LUE Deficits / Details: pt reports hx of coordination/dexterity impaired in L hand, drops things at home       Cervical / Trunk Assessment Cervical / Trunk Assessment: Normal   Communication Communication Communication: No apparent difficulties   Cognition Arousal: Alert Behavior During Therapy: WFL for tasks assessed/performed Cognition: No apparent impairments  Following commands: Intact       Cueing  General Comments          Exercises Other Exercises Other Exercises: Pt and his wife instructed on FMC/dexterity exercies for L hand with handout provided, pt verbalized understanding   Shoulder Instructions      Home Living Family/patient expects to be discharged to:: Private residence Living Arrangements: Spouse/significant other Available Help at Discharge: Family;Available 24 hours/day Type of Home: House Home Access: Stairs to enter [employer] of Steps: 2   Home Layout: One level     Bathroom Shower/Tub: Tub/shower unit;Walk-in shower   Bathroom Toilet: Handicapped height     Home Equipment: None          Prior Functioning/Environment Prior Level of Function :  Independent/Modified Independent             Mobility Comments: no AD for mobility, reports some furniture surfing ADLs Comments: Ind with ADLs, IADLs    OT Problem List: Decreased coordination;Impaired balance (sitting and/or standing)   OT Treatment/Interventions:        OT Goals(Current goals can be found in the care plan section)   Acute Rehab OT Goals Patient Stated Goal: go home   OT Frequency:       Co-evaluation              AM-PAC OT 6 Clicks Daily Activity     Outcome Measure Help from another person eating meals?: None Help from another person taking care of personal grooming?: None Help from another person toileting, which includes using toliet, bedpan, or urinal?: None Help from another person bathing (including washing, rinsing, drying)?: None Help from another person to put on and taking off regular upper body clothing?: None Help from another person to put on and taking off regular lower body clothing?: None 6 Click Score: 24   End of Session Nurse Communication: Mobility status  Activity Tolerance: Patient tolerated treatment well Patient left: in bed;with call bell/phone within reach;with family/visitor present  OT Visit Diagnosis: Unsteadiness on feet (R26.81)                Time: 8983-8958 OT Time Calculation (min): 25 min Charges:  OT General Charges $OT Visit: 1 Visit OT Evaluation $OT Eval Low Complexity: 1 Low OT Treatments $Therapeutic Activity: 8-22 mins    Thomas [name] 10/30/2023, 12:39 PM

## 2023-10-30 NOTE — Progress Notes (Signed)
 PT Cancellation Note  Patient Details Name: Thomas [name] MRN: [id number] DOB: [birth year]   Cancelled Treatment:    Reason Eval/Treat Not Completed: PT screened, no needs identified, will sign off (pt declines need for any therapy services stating baseline function. Will sign off per pt request)   Chaynce Schafer B [name] 10/30/2023, 7:52 AM Lenoard SQUIBB, PT Acute Rehabilitation Services Office: [phone]

## 2023-11-03 ENCOUNTER — Ambulatory Visit

## 2023-11-04 ENCOUNTER — Ambulatory Visit

## 2023-11-04 DIAGNOSIS — I495 Sick sinus syndrome: Secondary | ICD-10-CM

## 2023-11-04 LAB — CUP PACEART REMOTE DEVICE CHECK
Battery Remaining Longevity: 104 mo
Battery Voltage: 3 V
Brady Statistic AP VP Percent: 91.5 %
Brady Statistic AP VS Percent: 0.01 %
Brady Statistic AS VP Percent: 8.48 %
Brady Statistic AS VS Percent: 0.01 %
Brady Statistic RA Percent Paced: 91.5 %
Brady Statistic RV Percent Paced: 99.98 %
Date Time Interrogation Session: 20250701021125
Implantable Lead Connection Status: 753985
Implantable Lead Connection Status: 753985
Implantable Lead Connection Status: 753985
Implantable Lead Implant Date: 20180214
Implantable Lead Implant Date: 20180214
Implantable Lead Implant Date: 20240906
Implantable Lead Location: 753859
Implantable Lead Location: 753860
Implantable Lead Location: 753860
Implantable Lead Model: 3830
Implantable Lead Model: 5076
Implantable Lead Model: 5076
Implantable Pulse Generator Implant Date: 20240906
Lead Channel Impedance Value: 266 Ohm
Lead Channel Impedance Value: 285 Ohm
Lead Channel Impedance Value: 361 Ohm
Lead Channel Impedance Value: 380 Ohm
Lead Channel Impedance Value: 380 Ohm
Lead Channel Impedance Value: 380 Ohm
Lead Channel Impedance Value: 418 Ohm
Lead Channel Impedance Value: 494 Ohm
Lead Channel Impedance Value: 532 Ohm
Lead Channel Pacing Threshold Amplitude: 0.5 V
Lead Channel Pacing Threshold Amplitude: 0.875 V
Lead Channel Pacing Threshold Amplitude: 1.125 V
Lead Channel Pacing Threshold Pulse Width: 0.4 ms
Lead Channel Pacing Threshold Pulse Width: 0.4 ms
Lead Channel Pacing Threshold Pulse Width: 0.4 ms
Lead Channel Sensing Intrinsic Amplitude: 20.875 mV
Lead Channel Sensing Intrinsic Amplitude: 20.875 mV
Lead Channel Sensing Intrinsic Amplitude: 3.625 mV
Lead Channel Sensing Intrinsic Amplitude: 3.625 mV
Lead Channel Setting Pacing Amplitude: 1.5 V
Lead Channel Setting Pacing Amplitude: 1.75 V
Lead Channel Setting Pacing Amplitude: 2.25 V
Lead Channel Setting Pacing Pulse Width: 0.4 ms
Lead Channel Setting Pacing Pulse Width: 0.4 ms
Lead Channel Setting Sensing Sensitivity: 4 mV
Zone Setting Status: 755011

## 2023-11-05 ENCOUNTER — Encounter: Payer: Self-pay | Admitting: Nurse Practitioner

## 2023-11-05 ENCOUNTER — Ambulatory Visit: Attending: Nurse Practitioner | Admitting: Nurse Practitioner

## 2023-11-05 ENCOUNTER — Ambulatory Visit: Payer: Self-pay | Admitting: Cardiology

## 2023-11-05 ENCOUNTER — Ambulatory Visit

## 2023-11-05 VITALS — BP 114/66 | HR 63 | Ht 65.0 in | Wt 150.6 lb

## 2023-11-05 DIAGNOSIS — I480 Paroxysmal atrial fibrillation: Secondary | ICD-10-CM

## 2023-11-05 DIAGNOSIS — I428 Other cardiomyopathies: Secondary | ICD-10-CM

## 2023-11-05 DIAGNOSIS — I340 Nonrheumatic mitral (valve) insufficiency: Secondary | ICD-10-CM | POA: Diagnosis not present

## 2023-11-05 DIAGNOSIS — I255 Ischemic cardiomyopathy: Secondary | ICD-10-CM | POA: Diagnosis not present

## 2023-11-05 DIAGNOSIS — I25110 Atherosclerotic heart disease of native coronary artery with unstable angina pectoris: Secondary | ICD-10-CM

## 2023-11-05 DIAGNOSIS — I200 Unstable angina: Secondary | ICD-10-CM

## 2023-11-05 DIAGNOSIS — I351 Nonrheumatic aortic (valve) insufficiency: Secondary | ICD-10-CM | POA: Diagnosis not present

## 2023-11-05 DIAGNOSIS — E782 Mixed hyperlipidemia: Secondary | ICD-10-CM | POA: Diagnosis not present

## 2023-11-05 DIAGNOSIS — I495 Sick sinus syndrome: Secondary | ICD-10-CM

## 2023-11-05 DIAGNOSIS — N183 Chronic kidney disease, stage 3 unspecified: Secondary | ICD-10-CM

## 2023-11-05 DIAGNOSIS — I5022 Chronic systolic (congestive) heart failure: Secondary | ICD-10-CM

## 2023-11-05 MED ORDER — DIPHENHYDRAMINE HCL 50 MG PO TABS: 50.0000 mg | ORAL_TABLET | Freq: Once | ORAL | 0 refills | Status: DC

## 2023-11-05 MED ORDER — PREDNISONE 50 MG PO TABS: ORAL_TABLET | ORAL | 0 refills | Status: DC

## 2023-11-05 NOTE — Progress Notes (Signed)
 30 Day review completed. Medical Director ITP review done, changes made as directed, and signed approval by Medical Director. ? ?

## 2023-11-05 NOTE — Progress Notes (Signed)
 Office Visit    Patient Name: Thomas [name] Date of Encounter: 11/05/2023  Primary Care Provider:  System, Provider Not In Primary Cardiologist:  [name], MD  Chief Complaint    89 y.o. male with a history of nonobstructive CAD, complete heart block, nonischemic cardiomyopathy in the setting of prolonged RV pacing status post CRT-P (2024), chronic heart failure with midrange ejection fraction, moderate MR/AI, hypertension, hyperlipidemia, paroxysmal atrial fibrillation and flutter on Tikosyn  and Eliquis , TIA/stroke, and CKD III status post partial nephrectomy due to renal cell carcinoma, who presents for f/u related to unstable angina.  Past Medical History   Subjective   Past Medical History:  Diagnosis Date   Aortic insufficiency    a. 07/2023 Echo: Mod AI.   Aortic valve disorders    Arthritis    Basal cell carcinoma    face, nose left shoulder, left arm (06/19/2016)   Basal cell carcinoma 09/13/2020   right temple   BBB (bundle branch block)    hx right   CAD (coronary artery disease)    a. 10/2017 Cath: mod, nonobs dzs; b. 02/2021 Cath: mod, nonobs dzs; c. 11/2022 Cath: LAD 30ost/p, 53m, D1 min irregs, LCX 65p/m, OM1 min irregs, OM2 40, OM3 nl, RCA 30p, 2m, 40d w/ 40 in side branch-->Med rx.   Chronic back pain    neck, thoracic, lower back (06/19/2016)   CKD (chronic kidney disease), stage III (HCC)    Complete heart block (HCC) 06/2016   a. 06/2016 s/p MDT PPM; b. 01/2023 s/p upgrade to MDT CRT-P (ser # [id number]).   GERD (gastroesophageal reflux disease)    Gout    Heart block    I've had type I, II Wenke before now (06/19/2016)   History of gout    History of hiatal hernia    self dx'd (06/19/2016)   Hyperlipidemia    Hypertension    Lyme disease    dx'd by me 2003; cx's showed dx 08/2015   Migraine    3-4/year (06/19/2016)   Mitral regurgitation    a. 07/2023 Echo: Mod MR.   NICM (nonischemic cardiomyopathy - in setting of RV pacing) (HCC)     a. 02/2019 Echo: EF 45-50%; b. 10/2022 Echo: EF 30-35%; c. 01/2023 CRT-P upgrade; e. 07/2023 Echo: EF 40-45%, glob HK, GrI DD, nl RV size/fxn, RVSP 42.2 mmHg, mildly dil LA, mod MR, mod AI.   PAF (paroxysmal atrial fibrillation) (HCC)    a. CHA2DS2VASc = 7--> eliquis ; b. On tikosyn .   Paroxysmal atrial flutter (HCC)    Presence of permanent cardiac pacemaker    PVC's (premature ventricular contractions)    Renal cancer, left (HCC) 2006   S/P cryotherapy   Spinal stenosis    cervical, 1 thoracic, lumbar (06/19/2016)   Squamous carcinoma    face, nose left shoulder, left arm (06/19/2016)   Stroke (HCC)    TIA (transient ischemic attack) 06/14/2016   I'm not sure that's what it was (06/25/2016)   Visit for monitoring Tikosyn  therapy 09/09/2017   Past Surgical History:  Procedure Laterality Date   ANKLE FRACTURE SURGERY Right 1967   BACK SURGERY  05/07/2020   BASAL CELL CARCINOMA EXCISION     face, nose left shoulder, left arm   BIOPSY PROSTATE  2001 & 2003   BIV UPGRADE N/A 01/10/2023   Procedure: BIV UPGRADE;  Surgeon: Fernande Elspeth BROCKS, MD;  Location: [facility];  Service: Cardiovascular;  Laterality: N/A;   CARDIAC CATHETERIZATION  1990's  CARDIOVERSION N/A 09/11/2017   Procedure: CARDIOVERSION;  Surgeon: Pietro Redell RAMAN, MD;  Location: [facility] ENDOSCOPY;  Service: Cardiovascular;  Laterality: N/A;   FRACTURE SURGERY     HOLEP-LASER ENUCLEATION OF THE PROSTATE WITH MORCELLATION N/A 07/10/2020   Procedure: HOLEP-LASER ENUCLEATION OF THE PROSTATE WITH MORCELLATION;  Surgeon: Penne Knee, MD;  Location: [facility] ORS;  Service: Urology;  Laterality: N/A;   INGUINAL HERNIA REPAIR Left 2012   INSERT / REPLACE / REMOVE PACEMAKER  06/19/2016   LAPAROSCOPIC ABLATION RENAL MASS     LEFT HEART CATH AND CORONARY ANGIOGRAPHY Left 10/23/2017   Procedure: LEFT HEART CATH AND CORONARY ANGIOGRAPHY;  Surgeon: Darron Deatrice LABOR, MD;  Location: [facility] INVASIVE CV LAB;  Service: Cardiovascular;   Laterality: Left;   LEFT HEART CATH AND CORONARY ANGIOGRAPHY Left 02/26/2021   Procedure: LEFT HEART CATH AND CORONARY ANGIOGRAPHY;  Surgeon: Darron Deatrice LABOR, MD;  Location: [facility] INVASIVE CV LAB;  Service: Cardiovascular;  Laterality: Left;   PACEMAKER IMPLANT N/A 06/19/2016   Procedure: Pacemaker Implant;  Surgeon: Elspeth JAYSON Sage, MD;  Location: [facility] INVASIVE CV LAB;  Service: Cardiovascular;  Laterality: N/A;   pacemasker     PROSTATE SURGERY     RIGHT/LEFT HEART CATH AND CORONARY ANGIOGRAPHY Bilateral 11/25/2022   Procedure: RIGHT/LEFT HEART CATH AND CORONARY ANGIOGRAPHY;  Surgeon: Darron Deatrice LABOR, MD;  Location: [facility] INVASIVE CV LAB;  Service: Cardiovascular;  Laterality: Bilateral;   SQUAMOUS CELL CARCINOMA EXCISION     face, nose left shoulder, left arm   TONSILLECTOMY AND ADENOIDECTOMY      Allergies  Allergies  Allergen Reactions   Other Other (See Comments)    Vicryl sutures - patient states that site becomes soupy   Baclofen     Severe delirium    Iodinated Contrast Media Hives   Iodine Hives and Other (See Comments)    IVP contrast   Penicillins Itching, Rash and Other (See Comments)    ITCHY FEELING IN FINGERS; Tolerated Zosyn  04/2023       History of Present Illness      89 y.o. y/o male with the above past medical history including nonobstructive CAD, complete heart block, nonischemic cardiomyopathy, chronic heart failure with midrange ejection fraction, moderate MR/AI, hypertension, hyperlipidemia, paroxysmal atrial fibrillation and flutter, TIA/stroke, and CKD 3 status post partial nephrectomy in the setting of left renal cell carcinoma.  In the setting of admission for stroke, he was noted to have atrial fibrillation and complete heart block.  He underwent permanent pacemaker placement in February 2018.  He was also diagnosed with atrial fibrillation and subsequently underwent Tikosyn  loading.  Has been chronically coagulated with Eliquis .  Coronary CT angiogram  in May 2019 showed a calcium  score of 1366 with possible significant LAD disease.  He underwent diagnostic catheterization which showed moderate, nonobstructive three-vessel disease and medical therapy was recommended.  In June 2024, he was noted to have a drop in EF to 30-35% with global hypokinesis.  This was down from 45-50% in 2021.  He also complained of exertional chest discomfort.  He underwent diagnostic catheterization in July 2024 which showed stable three-vessel CAD with moderately calcified coronary arteries.  Right heart cath showed normal filling pressures and normal cardiac output.  Medical therapy was recommended with no FFR of the RCA and circumflex with possible PCI could be considered in the future for refractory angina.  In the interim, he was referred back to electrophysiology in the setting of persistent RV pacing and new cardiomyopathy.  He underwent  CRT-P in September 2024.  Follow-up echocardiogram in March 2025 showed some improvement in EF to 40-45% with RVSP 42.2 mmHg, moderate MR and moderate AI.     Mr. Vallery was seen in clinic in May 2025, at which time he reported a 1 month history of exertional midsternal chest burning that sometimes involves his left arm, occurring while walking his dog, and resolving with rest or slowing of his pace.  After prolonged discussion and consideration for repeat catheterization with FFR measurement (has a contrast allergy and CKD 3-4), we mutually agreed to pursue a medical therapy approach for send Imdur  15 mg daily was added.  Unfortunately, following initiation of Imdur , he has not noticed any significant change in his exertional symptoms.  He does not necessarily experience chest discomfort when he is doing lighter activities such as kneeling and using his arms while working in his garden but when he walks his dog, he will develop increasing substernal chest tightness followed by radiation to his arms, associated with mild dyspnea,   prompting him to slow down.  Symptoms will resolve within 5 to 15 minutes of slowing or stopping.  He does not typically have rest symptoms but did have an episode of chest tightness after palpating a location along the left side of his sternum last night.  Though symptoms resolved within 10 to 15 minutes.    He was admitted on October 28, 2023 due to sudden fatigue and mild left-sided clumsiness noted after working outside all day.  He thought he probably just overdid it and was overheated but was concerned about possible stroke.  MRI/a of the brain showed no acute stroke with mild to moderate stenoses of the P2 segments of the posterior cerebral arteries bilaterally and mild stenosis of the right ICA terminus in the distal left M1 segment.  Creatinine was slightly above baseline at 2.0 and improved to 1.6 by discharge.  Echo was performed during hospitalization and showed an EF of 55 to 60% with grade 1 diastolic dysfunction, normal RV function, trivial MR, mild to moderate AI, and mildly dilated ascending aorta at 42 mm.  He was subsequently discharged in June 26.  He was placed on a course of doxycycline  in the setting of a tick bite noted during hospitalization.  He has not had any recurrent weakness since discharge.  He denies palpitations, PND, orthopnea, dizziness, syncope, edema, or early satiety.   Objective   Home Medications    Current Outpatient Medications  Medication Sig Dispense Refill   acetaminophen  (TYLENOL ) 500 MG tablet Take 500-1,000 mg by mouth every 6 (six) hours as needed for mild pain (pain score 1-3) or fever.     apixaban  (ELIQUIS ) 2.5 MG TABS tablet Take 1 tablet (2.5 mg total) by mouth 2 (two) times daily. 90 tablet 3   Artificial Tears ophthalmic solution Place 1 drop into both eyes in the morning, at noon, in the evening, and at bedtime.     atorvastatin  (LIPITOR) 80 MG tablet Take 1 tablet (80 mg total) by mouth daily. 30 tablet 1   buPROPion  (WELLBUTRIN  XL) 150 MG 24 hr  tablet Take 150 mg by mouth daily.     calcium  carbonate (TUMS - DOSED IN MG ELEMENTAL CALCIUM ) 500 MG chewable tablet Chew 1-2 tablets by mouth daily as needed for indigestion.     Coenzyme Q10 (COQ10) 100 MG CAPS Take 100 mg by mouth daily.     docusate sodium  (COLACE) 100 MG capsule Take 100 mg by mouth daily  as needed for mild constipation.     dofetilide  (TIKOSYN ) 125 MCG capsule TAKE 1 CAPSULE BY MOUTH TWICE DAILY 180 capsule 2   doxycycline  (VIBRA -TABS) 100 MG tablet Take 100 mg by mouth daily.     DULoxetine  (CYMBALTA ) 20 MG capsule Take 20 mg by mouth 2 (two) times daily.     febuxostat  (ULORIC ) 40 MG tablet Take 1 tablet (40 mg total) by mouth daily. 90 tablet 3   isosorbide  mononitrate (IMDUR ) 30 MG 24 hr tablet Take 0.5 tablets (15 mg total) by mouth daily. 30 tablet 3   ketoconazole  (NIZORAL ) 2 % cream Apply to the feet every night to prevent fungal infection. 60 g 11   meclizine (ANTIVERT) 12.5 MG tablet Take 12.5 mg by mouth 3 (three) times daily.     metoprolol  succinate (TOPROL -XL) 50 MG 24 hr tablet TAKE 1 TABLET BY MOUTH DAILY WITH OR IMMEDIATELY FOLLOWING A MEAL (Patient taking differently: Take 50 mg by mouth at bedtime.) 90 tablet 3   metroNIDAZOLE  (FLAGYL ) 500 MG tablet TAKE 1 TABLET BY MOUTH TWICE DAILY ON SUNDAY AND MONDAY. 90 tablet 3   NEEDLE, DISP, 18 G (BD DISP NEEDLES) 18G X 1-1/2 MISC 1 mg by Does not apply route every 14 (fourteen) days. 50 each 0   NEEDLE, DISP, 21 G (BD DISP NEEDLES) 21G X 1-1/2 MISC 1 mg by Does not apply route every 14 (fourteen) days. 50 each 0   nitroGLYCERIN  (NITROSTAT ) 0.4 MG SL tablet Place 1 tablet (0.4 mg total) under the tongue every 5 (five) minutes as needed for chest pain. 25 tablet 3   omeprazole (PRILOSEC) 20 MG capsule Take 20 mg by mouth daily as needed (for heartburn- 30 minutes before a meal).     sacubitril -valsartan  (ENTRESTO ) 24-26 MG Take 1 tablet by mouth 2 (two) times daily. 180 tablet 3   sodium bicarbonate  650 MG tablet  Take 1 tablet (650 mg total) by mouth 2 (two) times daily for 10 days. 20 tablet 0   Syringe, Disposable, (2-3CC SYRINGE) 3 ML MISC 1 mg by Does not apply route every 14 (fourteen) days. 25 each 3   terbinafine  (LAMISIL ) 250 MG tablet Take 1 tablet (250 mg total) by mouth daily. 30 tablet 0   Testosterone  1.62 % GEL APPLY 2 PUMPS DAILY 75 g 3   testosterone  cypionate (DEPOTESTOSTERONE CYPIONATE) 200 MG/ML injection INJECT 1 ML (200 MG TOTAL) INTO THE MUSCLE EVERY 28 DAYS 10 mL 0   No current facility-administered medications for this visit.    Family history    Family History  Problem Relation Age of Onset   Heart attack Brother    Stroke Brother    Aortic stenosis Mother    Arthritis Father     Social history    Social History   Socioeconomic History   Marital status: Married    Spouse name: Not on file   Number of children: 3   Years of education: MD    Highest education level: Master's degree (e.g., MA, MS, MEng, MEd, MSW, MBA)  Occupational History   Occupation: Retired   Tobacco Use   Smoking status: Never   Smokeless tobacco: Never  Vaping Use   Vaping status: Never Used  Substance and Sexual Activity   Alcohol use: Yes    Alcohol/week: 1.0 standard drink of alcohol    Types: 1 Glasses of wine per week   Drug use: No   Sexual activity: Yes  Other Topics Concern   Not on  file  Social History Narrative   Independent at baseline. Lives with family   Drinks tea in the morning    Social Drivers of Health   Financial Resource Strain: Low Risk  (09/23/2023)   Received from [facility]   Overall Financial Resource Strain (CARDIA)    Difficulty of Paying Living Expenses: Not hard at all  Food Insecurity: No Food Insecurity (10/29/2023)   Hunger Vital Sign    Worried About Running Out of Food in the Last Year: Never true    Ran Out of Food in the Last Year: Never true  Transportation Needs: No Transportation Needs (10/29/2023)   PRAPARE -  Administrator, Civil Service (Medical): No    Lack of Transportation (Non-Medical): No  Physical Activity: Sufficiently Active (09/26/2021)   Exercise Vital Sign    Days of Exercise per Week: 5 days    Minutes of Exercise per Session: 50 min  Stress: No Stress Concern Present (09/26/2021)   [employer] of Occupational Health - Occupational Stress Questionnaire    Feeling of Stress : Not at all  Social Connections: Socially Integrated (10/29/2023)   Social Connection and Isolation Panel    Frequency of Communication with Friends and Family: Twice a week    Frequency of Social Gatherings with Friends and Family: Twice a week    Attends Religious Services: 1 to 4 times per year    Active Member of [employer] or Organizations: Yes    Attends Engineer, structural: More than 4 times per year    Marital Status: Married  Catering manager Violence: Not At Risk (10/29/2023)   Humiliation, Afraid, Rape, and Kick questionnaire    Fear of Current or Ex-Partner: No    Emotionally Abused: No    Physically Abused: No    Sexually Abused: No    Review of systems    +++ Exertional chest pain and dyspnea.  +++ Occasional cough.  He denies palpitations, pnd, orthopnea, n, v, dizziness, syncope, edema, weight gain, or early satiety.   Physical Exam    VS:  BP 114/66   Pulse 63   Ht 5' 5 (1.651 m)   Wt 150 lb 9.6 oz (68.3 kg)   SpO2 97%   BMI 25.06 kg/m  , BMI Body mass index is 25.06 kg/m.          GEN: Well nourished, well developed, in no acute distress. HEENT: normal. Neck: Supple, no JVD, carotid bruits, or masses. Cardiac: RRR, distant, no murmurs, rubs, or gallops. No clubbing, cyanosis, edema.  Radials 2+/PT 2+ and equal bilaterally.  Respiratory:  Respirations regular and unlabored, clear to auscultation bilaterally. GI: Soft, nontender, nondistended, BS + x 4. MS: no deformity or atrophy. Skin: warm and dry, no rash. Neuro:  Strength and sensation are  intact. Psych: Normal affect.  Accessory Clinical Findings    ECG personally reviewed by me today - EKG Interpretation Date/Time:  Wednesday November 05 2023 14:13:31 EDT Ventricular Rate:  63 PR Interval:  198 QRS Duration:  126 QT Interval:  462 QTC Calculation: 472 R Axis:   -20  Text Interpretation: AV dual-paced rhythm Confirmed by Vivienne Bruckner [id number]) on 11/05/2023 2:23:04 PM  - no acute changes.  Lab Results  Component Value Date   WBC 7.0 10/28/2023   HGB 16.0 10/28/2023   HCT 47.0 10/28/2023   MCV 88.4 10/28/2023   PLT 183 10/28/2023   Lab Results  Component Value Date   CREATININE 1.69 (  H) 10/30/2023   BUN 31 (H) 10/30/2023   NA 139 10/30/2023   K 4.0 10/30/2023   CL 99 10/30/2023   CO2 21 (L) 10/30/2023   Lab Results  Component Value Date   ALT 17 10/28/2023   AST 25 10/28/2023   ALKPHOS 78 10/28/2023   BILITOT 0.4 10/28/2023   Lab Results  Component Value Date   CHOL 148 10/29/2023   HDL 45 10/29/2023   LDLCALC 91 10/29/2023   TRIG 59 10/29/2023   CHOLHDL 3.3 10/29/2023    Lab Results  Component Value Date   HGBA1C 6.2 (H) 10/28/2023   Lab Results  Component Value Date   TSH 2.420 01/09/2022       Assessment & Plan    1.  Unstable angina/coronary artery disease: Patient with a history of moderate, nonobstructive CAD with catheterization in July 2024 showing stable anatomy with 65% proximal mid left circumflex disease and 70% mid RCA disease.  After medically managed over the past year but over the past 2 months, he has been experiencing exertional chest tightness and dyspnea that resolves with rest.  We added long-acting nitrate therapy last month however, this did not significantly change symptoms and due to relatively blood pressure, there is not much room to titrate nitrate further.  Recent echo showed improvement in EF to 55-60%.  In light of ongoing symptoms we discussed options for management today.  His wife was present for the discussion  as well.  We mutually agreed to proceed with diagnostic catheterization with appropriate holding of Eliquis  for 2 days prior to the procedure, pretreatment with prednisone  and Benadryl  for contrast allergy prophylaxis, and IV fluid hydration in the setting of known CKD.  I will continue statin and long-acting nitrate therapy. Informed Consent   Shared Decision Making/Informed Consent The risks [stroke (1 in 1000), death (1 in 1000), kidney failure [usually temporary] (1 in 500), bleeding (1 in 200), allergic reaction [possibly serious] (1 in 200)], benefits (diagnostic support and management of coronary artery disease) and alternatives of a cardiac catheterization were discussed in detail with Mr. Hjort and he is willing to proceed.     2.  Mixed ischemic/nonischemic cardiomyopathy/heart failure with improved ejection fraction: Patient with prior drop in EF to 30-35% in June of 2024 in the setting of persistent RV pacing.  He subsequently underwent CRT-P with improvement in EF to 40 to 45% by echo in March 2025 and more recently, 55 to 60% by echo in June 2025.  Patient is euvolemic on examination.  He does have some dyspnea in the setting of unstable angina as outlined above.  He remains on beta-blocker and Entresto .  3.  Primary HTN:  stable on ? blocker and entresto .  4.  HL: Typically followed at the [facility].  LDL of 91 in June.  He is on atorvastatin  80 mg daily.  I note that LDL was previously much lower in September 2023, when he was at 62.  Follow-up catheterization but will likely need addition of Zetia and/or PCSK9 inhibitor in the future.  5.  Paroxysmal atrial flutter/fibrillation: AV paced today.  He remains on Tikosyn  and is anticoagulated Eliquis  2.5 mg twice daily.  He will hold Eliquis  for 2 days prior to pending catheterization.  6.  Complete heart block: Status post permanent pacemaker with CRT-P upgrade in 2024.  AV paced today.  7.  Stage III-IV chronic kidney disease: On Entresto .   Recent creatinine was 1.69 on June 26.  Plan for hydration  pre and post catheterization next week.  8.  Valvular heart disease/mitral regurgitation/aortic insufficiency: Patient with moderate MR and AI on echocardiogram in March 2025.  June 2025 echo showed trivial MR and mild to moderate AI.  9.  Weakness: Patient recently admitted due to fatigue and mild left-sided weakness/clumsiness.  No acute findings on MRI.  He was treated with IV fluids with improvement in symptoms.  No recurrent symptoms.  10.  Disposition: Diagnostic catheterization next week with office follow-up approximately 2 weeks later.  Lonni Meager, NP 11/05/2023, 2:57 PM

## 2023-11-05 NOTE — Progress Notes (Signed)
 Cardiac Individual Treatment Plan  Patient Details  Name: Thomas [name] MRN: [id number] Date of Birth: 12/09/1932 Referring Provider:   Flowsheet Row Cardiac Rehab from 05/19/2023 in [facility] Cardiac and Pulmonary Rehab  Referring Provider Dr. [name]    Initial Encounter Date:  Flowsheet Row Cardiac Rehab from 05/19/2023 in [facility] Cardiac and Pulmonary Rehab  Date 05/19/23    Visit Diagnosis: Heart failure, chronic systolic (HCC)  Patient's Home Medications on Admission:  Current Outpatient Medications:    acetaminophen  (TYLENOL ) 500 MG tablet, Take 500-1,000 mg by mouth every 6 (six) hours as needed for mild pain (pain score 1-3) or fever., Disp: , Rfl:    apixaban  (ELIQUIS ) 2.5 MG TABS tablet, Take 1 tablet (2.5 mg total) by mouth 2 (two) times daily., Disp: 90 tablet, Rfl: 3   Artificial Tears ophthalmic solution, Place 1 drop into both eyes in the morning, at noon, in the evening, and at bedtime., Disp: , Rfl:    atorvastatin  (LIPITOR) 80 MG tablet, Take 1 tablet (80 mg total) by mouth daily., Disp: 30 tablet, Rfl: 1   buPROPion  (WELLBUTRIN  XL) 150 MG 24 hr tablet, Take 150 mg by mouth daily., Disp: , Rfl:    calcium  carbonate (TUMS - DOSED IN MG ELEMENTAL CALCIUM ) 500 MG chewable tablet, Chew 1-2 tablets by mouth daily as needed for indigestion., Disp: , Rfl:    Coenzyme Q10 (COQ10) 100 MG CAPS, Take 100 mg by mouth daily., Disp: , Rfl:    docusate sodium  (COLACE) 100 MG capsule, Take 100 mg by mouth daily as needed for mild constipation., Disp: , Rfl:    dofetilide  (TIKOSYN ) 125 MCG capsule, TAKE 1 CAPSULE BY MOUTH TWICE DAILY, Disp: 180 capsule, Rfl: 2   doxycycline  (VIBRA -TABS) 100 MG tablet, Take 100 mg by mouth daily., Disp: , Rfl:    DULoxetine  (CYMBALTA ) 20 MG capsule, Take 20 mg by mouth 2 (two) times daily., Disp: , Rfl:    febuxostat  (ULORIC ) 40 MG tablet, Take 1 tablet (40 mg total) by mouth daily., Disp: 90 tablet, Rfl: 3   isosorbide  mononitrate (IMDUR ) 30 MG 24 hr  tablet, Take 0.5 tablets (15 mg total) by mouth daily., Disp: 30 tablet, Rfl: 3   ketoconazole  (NIZORAL ) 2 % cream, Apply to the feet every night to prevent fungal infection., Disp: 60 g, Rfl: 11   meclizine (ANTIVERT) 12.5 MG tablet, Take 12.5 mg by mouth 3 (three) times daily., Disp: , Rfl:    metoprolol  succinate (TOPROL -XL) 50 MG 24 hr tablet, TAKE 1 TABLET BY MOUTH DAILY WITH OR IMMEDIATELY FOLLOWING A MEAL (Patient taking differently: Take 50 mg by mouth at bedtime.), Disp: 90 tablet, Rfl: 3   metroNIDAZOLE  (FLAGYL ) 500 MG tablet, TAKE 1 TABLET BY MOUTH TWICE DAILY ON SUNDAY AND MONDAY., Disp: 90 tablet, Rfl: 3   NEEDLE, DISP, 18 G (BD DISP NEEDLES) 18G X 1-1/2 MISC, 1 mg by Does not apply route every 14 (fourteen) days., Disp: 50 each, Rfl: 0   NEEDLE, DISP, 21 G (BD DISP NEEDLES) 21G X 1-1/2 MISC, 1 mg by Does not apply route every 14 (fourteen) days., Disp: 50 each, Rfl: 0   nitroGLYCERIN  (NITROSTAT ) 0.4 MG SL tablet, Place 1 tablet (0.4 mg total) under the tongue every 5 (five) minutes as needed for chest pain. (Patient not taking: Reported on 10/29/2023), Disp: 25 tablet, Rfl: 3   omeprazole (PRILOSEC) 20 MG capsule, Take 20 mg by mouth daily as needed (for heartburn- 30 minutes before a meal)., Disp: , Rfl:  sacubitril -valsartan  (ENTRESTO ) 24-26 MG, Take 1 tablet by mouth 2 (two) times daily., Disp: 180 tablet, Rfl: 3   sodium bicarbonate  650 MG tablet, Take 1 tablet (650 mg total) by mouth 2 (two) times daily for 10 days., Disp: 20 tablet, Rfl: 0   Syringe, Disposable, (2-3CC SYRINGE) 3 ML MISC, 1 mg by Does not apply route every 14 (fourteen) days., Disp: 25 each, Rfl: 3   terbinafine  (LAMISIL ) 250 MG tablet, Take 1 tablet (250 mg total) by mouth daily., Disp: 30 tablet, Rfl: 0   Testosterone  1.62 % GEL, APPLY 2 PUMPS DAILY, Disp: 75 g, Rfl: 3   testosterone  cypionate (DEPOTESTOSTERONE CYPIONATE) 200 MG/ML injection, INJECT 1 ML (200 MG TOTAL) INTO THE MUSCLE EVERY 28 DAYS, Disp: 10  mL, Rfl: 0  Past Medical History: Past Medical History:  Diagnosis Date   Aortic insufficiency    a. 07/2023 Echo: Mod AI.   Aortic valve disorders    Arthritis    Basal cell carcinoma    face, nose left shoulder, left arm (06/19/2016)   Basal cell carcinoma 09/13/2020   right temple   BBB (bundle branch block)    hx right   CAD (coronary artery disease)    a. 10/2017 Cath: mod, nonobs dzs; b. 02/2021 Cath: mod, nonobs dzs; c. 11/2022 Cath: LAD 30ost/p, 65m, D1 min irregs, LCX 65p/m, OM1 min irregs, OM2 40, OM3 nl, RCA 30p, 39m, 40d w/ 40 in side branch-->Med rx.   Chronic back pain    neck, thoracic, lower back (06/19/2016)   CKD (chronic kidney disease), stage III (HCC)    Complete heart block (HCC) 06/2016   a. 06/2016 s/p MDT PPM; b. 01/2023 s/p upgrade to MDT CRT-P (ser # [id number]).   GERD (gastroesophageal reflux disease)    Gout    Heart block    I've had type I, II Wenke before now (06/19/2016)   History of gout    History of hiatal hernia    self dx'd (06/19/2016)   Hyperlipidemia    Hypertension    Lyme disease    dx'd by me 2003; cx's showed dx 08/2015   Migraine    3-4/year (06/19/2016)   Mitral regurgitation    a. 07/2023 Echo: Mod MR.   NICM (nonischemic cardiomyopathy - in setting of RV pacing) (HCC)    a. 02/2019 Echo: EF 45-50%; b. 10/2022 Echo: EF 30-35%; c. 01/2023 CRT-P upgrade; e. 07/2023 Echo: EF 40-45%, glob HK, GrI DD, nl RV size/fxn, RVSP 42.2 mmHg, mildly dil LA, mod MR, mod AI.   PAF (paroxysmal atrial fibrillation) (HCC)    a. CHA2DS2VASc = 7--> eliquis ; b. On tikosyn .   Paroxysmal atrial flutter (HCC)    Presence of permanent cardiac pacemaker    PVC's (premature ventricular contractions)    Renal cancer, left (HCC) 2006   S/P cryotherapy   Spinal stenosis    cervical, 1 thoracic, lumbar (06/19/2016)   Squamous carcinoma    face, nose left shoulder, left arm (06/19/2016)   Stroke (HCC)    TIA (transient ischemic attack) 06/14/2016    I'm not sure that's what it was (06/25/2016)   Visit for monitoring Tikosyn  therapy 09/09/2017    Tobacco Use: Social History   Tobacco Use  Smoking Status Never  Smokeless Tobacco Never    Labs: Review Flowsheet  More data exists      Latest Ref Rng & Units 08/21/2021 01/09/2022 11/25/2022 10/28/2023 10/29/2023  Labs for ITP Cardiac and Pulmonary Rehab  Cholestrol 0 -  200 mg/dL - 866  - - 851   LDL (calc) 0 - 99 mg/dL - 56  - - 91   HDL-C >59 mg/dL - 67  - - 45   Trlycerides <150 mg/dL - 39  - - 59   Hemoglobin A1c 4.8 - 5.6 % 5.7  5.7  - 6.2  -  PH, Arterial 7.35 - 7.45 - - 7.376  - -  PCO2 arterial 32 - 48 mmHg - - 41.7  - -  Bicarbonate 20.0 - 28.0 mmol/L - - 25.5  24.4  - -  TCO2 22 - 32 mmol/L - - 27  26  25   -  Acid-base deficit 0.0 - 2.0 mmol/L - - 1.0  - -  O2 Saturation % - - 70  88  - -    Details       Multiple values from one day are sorted in reverse-chronological order          Exercise Target Goals: Exercise Program Goal: Individual exercise prescription set using results from initial 6 min walk test and THRR while considering  patient's activity barriers and safety.   Exercise Prescription Goal: Initial exercise prescription builds to 30-45 minutes a day of aerobic activity, 2-3 days per week.  Home exercise guidelines will be given to patient during program as part of exercise prescription that the participant will acknowledge.   Education: Aerobic Exercise: - Group verbal and visual presentation on the components of exercise prescription. Introduces F.I.T.T principle from ACSM for exercise prescriptions.  Reviews F.I.T.T. principles of aerobic exercise including progression. Written material given at graduation.   Education: Resistance Exercise: - Group verbal and visual presentation on the components of exercise prescription. Introduces F.I.T.T principle from ACSM for exercise prescriptions  Reviews F.I.T.T. principles of resistance exercise  including progression. Written material given at graduation. Flowsheet Row Cardiac Rehab from 07/30/2023 in [facility] Cardiac and Pulmonary Rehab  Date 07/16/23  Educator [facility]  Instruction Review Code 1- [employer] Understanding     Education: Exercise & Equipment Safety: - Individual verbal instruction and demonstration of equipment use and safety with use of the equipment. Flowsheet Row Cardiac Rehab from 07/30/2023 in [facility] Cardiac [facility] Pulmonary Rehab  Date 05/19/23  Educator [facility]  Instruction Review Code 1- Verbalizes Understanding    Education: Exercise Physiology & General Exercise Guidelines: - Group verbal and written instruction with models to review the exercise physiology of the cardiovascular system and associated critical values. Provides general exercise guidelines with specific guidelines to those with heart or lung disease.    Education: Flexibility, Balance, Mind/Body Relaxation: - Group verbal and visual presentation with interactive activity on the components of exercise prescription. Introduces F.I.T.T principle from ACSM for exercise prescriptions. Reviews F.I.T.T. principles of flexibility and balance exercise training including progression. Also discusses the mind body connection.  Reviews various relaxation techniques to help reduce and manage stress (i.e. Deep breathing, progressive muscle relaxation, and visualization). Balance handout provided to take home. Written material given at graduation. Flowsheet Row Cardiac Rehab from 07/30/2023 in [facility] Cardiac and Pulmonary Rehab  Date 07/16/23  Educator [facility]  Instruction Review Code 1- Verbalizes Understanding    Activity Barriers & Risk Stratification:  Activity Barriers & Cardiac Risk Stratification - 05/19/23 1526       Activity Barriers & Cardiac Risk Stratification   Activity Barriers Back Problems;Muscular Weakness    Cardiac Risk Stratification High          6 Minute Walk:  6 Minute Walk  Row Name 05/19/23  1525 08/07/23 1401       6 Minute Walk   Phase Initial Discharge    Distance 1360 feet 1390 feet    Distance % Change -- 2.2 %    Distance Feet Change -- 30 ft    Walk Time 6 minutes 6 minutes    # of Rest Breaks 0 0    MPH 2.58 2.63    METS 2.34 2.16    RPE 11 11    Perceived Dyspnea  0 0    VO2 Peak 8.21 7.57    Symptoms No No    Resting HR 84 bpm 63 bpm    Resting BP 118/78 128/66    Resting Oxygen Saturation  98 % 94 %    Exercise Oxygen Saturation  during 6 min walk 92 % 89 %    Max Ex. HR 117 bpm 97 bpm    Max Ex. BP 132/70 138/72    2 Minute Post BP 120/80 --       Oxygen Initial Assessment:   Oxygen Re-Evaluation:   Oxygen Discharge (Final Oxygen Re-Evaluation):   Initial Exercise Prescription:  Initial Exercise Prescription - 05/19/23 1500       Date of Initial Exercise RX and Referring Provider   Date 05/19/23    Referring Provider Dr. [name]      Oxygen   Maintain Oxygen Saturation 88% or higher      Treadmill   MPH 1.8    Grade 1    Minutes 15    METs 2.63      Recumbant Bike   Level 3    RPM 50    Watts 20    Minutes 15    METs 2.34      NuStep   Level 3    SPM 80    Minutes 15    METs 2.34      REL-XR   Level 3    Speed 50    Minutes 15    METs 2.34      Biostep-RELP   Level 3    SPM 50    Minutes 15    METs 2.34      Prescription Details   Frequency (times per week) 3    Duration Progress to 30 minutes of continuous aerobic without signs/symptoms of physical distress      Intensity   THRR 40-80% of Max Heartrate 103-122    Ratings of Perceived Exertion 11-13    Perceived Dyspnea 0-4      Progression   Progression Continue to progress workloads to maintain intensity without signs/symptoms of physical distress.      Resistance Training   Training Prescription Yes    Weight 7    Reps 10-15          Perform Capillary Blood Glucose checks as needed.  Exercise Prescription Changes:   Exercise  Prescription Changes     Row Name 05/19/23 1500 06/02/23 1500 06/17/23 1500 06/30/23 1400 07/02/23 0800     Response to Exercise   Blood Pressure (Admit) 118/78 106/60 136/66 -- 152/80   Blood Pressure (Exercise) 132/70 122/56 155/66 -- 152/74   Blood Pressure (Exit) 120/80 126/60 126/64 -- 130/66   Heart Rate (Admit) 84 bpm 89 bpm 82 bpm -- 50 bpm   Heart Rate (Exercise) 117 bpm 117 bpm 123 bpm -- 128 bpm   Heart Rate (Exit) 66 bpm 67 bpm 70 bpm -- 64 bpm  Oxygen Saturation (Admit) 98 % -- -- -- --   Oxygen Saturation (Exercise) 92 % -- -- -- --   Oxygen Saturation (Exit) 99 % -- -- -- --   Rating of Perceived Exertion (Exercise) 11 13 13  -- 13   Perceived Dyspnea (Exercise) 0 -- 0 -- 0   Symptoms none none none -- none   Comments 6 MWT results first 2 exercise sessions -- -- --   Duration -- Progress to 30 minutes of  aerobic without signs/symptoms of physical distress Progress to 30 minutes of  aerobic without signs/symptoms of physical distress -- Progress to 30 minutes of  aerobic without signs/symptoms of physical distress   Intensity -- THRR unchanged THRR unchanged -- THRR unchanged     Progression   Progression -- Continue to progress workloads to maintain intensity without signs/symptoms of physical distress. Continue to progress workloads to maintain intensity without signs/symptoms of physical distress. -- Continue to progress workloads to maintain intensity without signs/symptoms of physical distress.   Average METs -- 2.38 2.8 -- 3.45     Resistance Training   Training Prescription -- Yes Yes -- Yes   Weight -- 7 7 -- 7   Reps -- 10-15 10-15 -- 10-15     Interval Training   Interval Training -- -- No -- No     Oxygen   Oxygen -- Continuous -- -- --     Treadmill   MPH -- 2.2 2.2 -- 3   Grade -- 1 0 -- 0.5   Minutes -- 15 15 -- 15   METs -- 2.99 -- -- 3.5     NuStep   Level -- 3 -- -- --   SPM -- 80 -- -- --   Minutes -- 15 -- -- --   METs -- 2 -- -- --      REL-XR   Level -- -- 3 -- 4   Minutes -- -- 15 -- 15   METs -- -- 3.2 -- 3.9     T5 Nustep   Level -- 1 -- -- --   SPM -- 80 -- -- --   Minutes -- 15 -- -- --   METs -- 2 -- -- --     Home Exercise Plan   Plans to continue exercise at -- -- -- [facility]  Pt plans to join [employer] for aerobic equipment, Walking dog around 1 mile a day [employer] [facility]  Pt plans to join [employer] for aerobic equipment, Walking dog around 1 mile a day   Frequency -- -- -- Add 2 additional days to program exercise sessions. Add 2 additional days to program exercise sessions.   Initial Home Exercises Provided -- -- -- 06/30/23 06/30/23     Oxygen   Maintain Oxygen Saturation -- 88% or higher 88% or higher 88% or higher 88% or higher    Row Name 07/17/23 1700 07/28/23 1400 08/14/23 0800 08/28/23 1500 11/05/23 0900     Response to Exercise   Blood Pressure (Admit) 120/78 122/64 128/66 136/70 128/62   Blood Pressure (Exercise) 134/64 -- 138/72 -- 128/74   Blood Pressure (Exit) 110/70 124/66 124/68 108/62 122/82   Heart Rate (Admit) 59 bpm 66 bpm 63 bpm 60 bpm 72 bpm   Heart Rate (Exercise) 121 bpm 120 bpm 122 bpm 98 bpm 90 bpm   Heart Rate (Exit) 73 bpm 68 bpm 66 bpm 64 bpm 78 bpm   Oxygen Saturation (Admit) -- -- 94 % -- --  Oxygen Saturation (Exercise) -- -- 89 % -- --   Oxygen Saturation (Exit) -- -- 89 % -- --   Rating of Perceived Exertion (Exercise) 13 13 13 13 13    Perceived Dyspnea (Exercise) -- -- 0 -- --   Symptoms none none none none none   Duration Continue with 30 min of aerobic exercise without signs/symptoms of physical distress. Continue with 30 min of aerobic exercise without signs/symptoms of physical distress. Continue with 30 min of aerobic exercise without signs/symptoms of physical distress. Continue with 30 min of aerobic exercise without signs/symptoms of physical distress. Continue with 30 min of aerobic exercise without signs/symptoms of physical distress.    Intensity THRR unchanged THRR unchanged THRR unchanged THRR unchanged THRR unchanged     Progression   Progression Continue to progress workloads to maintain intensity without signs/symptoms of physical distress. Continue to progress workloads to maintain intensity without signs/symptoms of physical distress. Continue to progress workloads to maintain intensity without signs/symptoms of physical distress. Continue to progress workloads to maintain intensity without signs/symptoms of physical distress. Continue to progress workloads to maintain intensity without signs/symptoms of physical distress.   Average METs 3.28 3.62 3.88 4.23 2.81     Resistance Training   Training Prescription Yes Yes Yes Yes Yes   Weight 7 8 8 8  lb 8 lb   Reps 10-15 10-15 10-15 10-15 10-15     Interval Training   Interval Training No No No No No     Treadmill   MPH 3.2 3.2 3.4 3.2 2.1   Grade 0.5 0.5 0 0 0   Minutes 15 15 15 15 15    METs 3.67 3.67 3.6 3.45 2.61     NuStep   Level 3 -- 4  T6 -- 3   Minutes 15 -- 15 -- 15   METs 2.6 -- 2.7 -- 3     REL-XR   Level 4 8 5 9  --   Minutes 15 15 15 15  --   METs 4.1 5.4 6.4 5 --     T5 Nustep   Level 4 4 -- -- --   Minutes 15 15 -- -- --   METs 2.8 2.5 -- -- --     Home Exercise Plan   Plans to continue exercise at [facility]  Pt plans to join [employer] for aerobic equipment, Walking dog around 1 mile a day [employer]  Pt plans to join [employer] for aerobic equipment, Walking dog around 1 mile a day [employer] [facility]  Pt plans to join [employer] for aerobic equipment, Walking dog around 1 mile a day [employer] [facility]  Pt plans to join [employer] for aerobic equipment, Walking dog around 1 mile a day [employer] [facility]  Pt plans to join [employer] for aerobic equipment, Walking dog around 1 mile a day   Frequency Add 2 additional days to program exercise sessions. Add 2 additional days to program exercise sessions. Add 2 additional days to program  exercise sessions. Add 2 additional days to program exercise sessions. Add 2 additional days to program exercise sessions.   Initial Home Exercises Provided 06/30/23 06/30/23 06/30/23 06/30/23 06/30/23     Oxygen   Maintain Oxygen Saturation -- 88% or higher 88% or higher 88% or higher 88% or higher      Exercise Comments:   Exercise Comments     Row Name 05/21/23 1505           Exercise Comments First full day  of exercise!  Patient was oriented to gym and equipment including functions, settings, policies, and procedures.  Patient's individual exercise prescription and treatment plan were reviewed.  All starting workloads were established based on the results of the 6 minute walk test done at initial orientation visit.  The plan for exercise progression was also introduced and progression will be customized based on patient's performance and goals.          Exercise Goals and Review:   Exercise Goals     Row Name 05/19/23 1530             Exercise Goals   Increase Physical Activity Yes       Intervention Provide advice, education, support and counseling about physical activity/exercise needs.;Develop an individualized exercise prescription for aerobic and resistive training based on initial evaluation findings, risk stratification, comorbidities and participant's personal goals.       Expected Outcomes Short Term: Attend rehab on a regular basis to increase amount of physical activity.;Long Term: Add in home exercise to make exercise part of routine and to increase amount of physical activity.;Long Term: Exercising regularly at least 3-5 days a week.       Increase Strength and Stamina Yes       Intervention Provide advice, education, support and counseling about physical activity/exercise needs.;Develop an individualized exercise prescription for aerobic and resistive training based on initial evaluation findings, risk stratification, comorbidities and participant's personal  goals.       Expected Outcomes Short Term: Increase workloads from initial exercise prescription for resistance, speed, and METs.;Short Term: Perform resistance training exercises routinely during rehab and add in resistance training at home;Long Term: Improve cardiorespiratory fitness, muscular endurance and strength as measured by increased METs and functional capacity ( )       Able to understand and use rate of perceived exertion (RPE) scale Yes       Intervention Provide education and explanation on how to use RPE scale       Expected Outcomes Short Term: Able to use RPE daily in rehab to express subjective intensity level;Long Term:  Able to use RPE to guide intensity level when exercising independently       Able to understand and use Dyspnea scale Yes       Intervention Provide education and explanation on how to use Dyspnea scale       Expected Outcomes Short Term: Able to use Dyspnea scale daily in rehab to express subjective sense of shortness of breath during exertion;Long Term: Able to use Dyspnea scale to guide intensity level when exercising independently       Knowledge and understanding of Target Heart Rate Range (THRR) Yes       Intervention Provide education and explanation of THRR including how the numbers were predicted and where they are located for reference       Expected Outcomes Short Term: Able to state/look up THRR;Long Term: Able to use THRR to govern intensity when exercising independently;Short Term: Able to use daily as guideline for intensity in rehab       Able to check pulse independently Yes       Intervention Provide education and demonstration on how to check pulse in carotid and radial arteries.;Review the importance of being able to check your own pulse for safety during independent exercise       Expected Outcomes Short Term: Able to explain why pulse checking is important during independent exercise;Long Term: Able to check pulse independently and accurately  Understanding of Exercise Prescription Yes       Intervention Provide education, explanation, and written materials on patient's individual exercise prescription       Expected Outcomes Short Term: Able to explain program exercise prescription;Long Term: Able to explain home exercise prescription to exercise independently          Exercise Goals Re-Evaluation :  Exercise Goals Re-Evaluation     Row Name 05/21/23 1508 06/02/23 1558 06/11/23 1406 06/17/23 1531 06/30/23 1429     Exercise Goal Re-Evaluation   Exercise Goals Review Increase Physical Activity;Able to understand and use rate of perceived exertion (RPE) scale;Knowledge and understanding of Target Heart Rate Range (THRR);Understanding of Exercise Prescription;Increase Strength and Stamina;Able to check pulse independently Increase Physical Activity;Understanding of Exercise Prescription;Increase Strength and Stamina Increase Physical Activity;Increase Strength and Stamina;Understanding of Exercise Prescription Increase Physical Activity;Increase Strength and Stamina;Understanding of Exercise Prescription Increase Physical Activity;Able to understand and use Dyspnea scale;Understanding of Exercise Prescription;Knowledge and understanding of Target Heart Rate Range (THRR);Increase Strength and Stamina;Able to check pulse independently;Able to understand and use rate of perceived exertion (RPE) scale   Comments Reviewed RPE and dyspnea scale, THR and program prescription with pt today.  Pt voiced understanding and was given a copy of goals to take home. Kotaro is off to a good start in the program. He has completed 2 exercise sessions. He did well with his exercise prescription and even increased his treadmill workload to a speed of 2. and incline of 1%. He also added T5 nustep to his exercise prescription. We will continue to monitor his progress in the program. Quaid is doing well at rehab, feels he is out of shape but is determined to get  back to where he was before the surgeries. Currently on treadmill at 1.8 speed. He is happy with the ability to workout safely while being monitored. Encouraged him to continue to workout here at rehab and doing exercise safely when at home. Alessio is doing well in rehab. He was able to maintain his speed on the treadmill but decreased his incline from 1% to 0. He was also able to maintain a level of 3 on the XR. We will continue to encourage and monitor his progression in the program. Reviewed home exercise with pt today.  Pt plans to join the [employer] for the aerobic equipment, and also will continue to walk his dog about 1 mile a day for exercise.  Reviewed THR, pulse, RPE, sign and symptoms, pulse oximetery and when to call 911 or MD.  Also discussed weather considerations and indoor options.  Pt voiced understanding.   Expected Outcomes Short: Use RPE daily to regulate intensity.  Long: Follow program prescription in THR. Short: Continue to follow current exercise prescription. Long: Continue exercise to improve strength and stamina. STG: continue to come to rehab and increase workload as able. LTG: Continue to improve strenght and stamina Short: Continue to follow exercise prescription and increase workloads when able. Long: Continue exercise to improve strength and stamina. Short: Join [facility] for aerobic equipment and exercise. Long: Continue exercise to improve strength and stamina.    Row Name 07/02/23 9177 07/17/23 1742 07/23/23 1414 07/28/23 1447 08/14/23 0844     Exercise Goal Re-Evaluation   Exercise Goals Review Increase Physical Activity;Increase Strength and Stamina;Understanding of Exercise Prescription Increase Physical Activity;Increase Strength and Stamina;Understanding of Exercise Prescription Increase Physical Activity;Increase Strength and Stamina;Understanding of Exercise Prescription Increase Physical Activity;Increase Strength and Stamina;Understanding of Exercise Prescription  Increase Physical Activity;Increase Strength and  Stamina;Understanding of Exercise Prescription   Comments Toribio is doing well in rehab. He was able to increase his treadmill workload to a speed of and an incline of 0.5%. He was also able to increase from level 3 to 4 on the XR. We will continue to monitor his progress in the program. Iban is doing well in rehab. He was able to increase his treadmill workload to a speed of 3.2 mph and maintain an incline of 0.5%. He was also able to maintain level 4 on the XR and increase to level 4 on the T5 nustep. We will continue to monitor his progress in the program. Chay is walking and doing weight exercises at home fairly consistently. He is consistently getting 3-4 days a week of exercise in each week, including his days in rehab. He was encouraged to work towards 4 days a week  of exercise consistently. Exercise progression and interval training was discussed with patient. He may start incorporating interval training as he feels ready to progress to higher intensities. THRR was reviewed and patient was encouraged to look into buying a pulse ox to better monitor his heart rate a home while exercising. Jeremy continues to do well in rehab. He increased to level 8 on the XR and level 4 on the T5 nustep. He has maintained his workload on the treadmill at a speed of 3.2 mph and incline of 0.5%. He also increased his handweights to 8 lbs for resistance. We will continue to monitor his progress in the program. Inioluwa continues to do well in rehab and will graduate very soon. He completed his post and walked 1390 ft, which is a 2.2% improvement from his initial. He increased to level 4 on the T6 nustep. He also increased his speed on the treadmill to 3.4 mph. We will continue to monitor his progress in the program.   Expected Outcomes Short: Continue to increase treadmill workload. Long: Continue exercise to improve strength and stamina. Short: Continue to progressively  increase workload on the XR and treadmill. Long: Continue exercise to improve strength and stamina. Short: try interval training in cardiac rehab and purchase a pulse ox to help monitor heart rate during exercise at home. Long: join [facility] after graduation and continue with independent exercise routien. Short: Continue to progressively increase treadmill workload. Long: Continue exercise to improve strength and stamina. Short: Graduate. Long: Continue to exercise independently.    Row Name 08/28/23 1600 09/11/23 1419 10/06/23 1608 10/23/23 0809 11/05/23 0903     Exercise Goal Re-Evaluation   Exercise Goals Review Increase Physical Activity;Increase Strength and Stamina;Understanding of Exercise Prescription Increase Physical Activity;Increase Strength and Stamina;Understanding of Exercise Prescription -- Increase Physical Activity;Increase Strength and Stamina;Understanding of Exercise Prescription Increase Physical Activity;Increase Strength and Stamina;Understanding of Exercise Prescription   Comments Red is doing well in rehab but has only attended one session since the last review. He did improve to level 9 on the XR and continues to walk above 3 mph on the treadmill. We will continue to monitor his progress until he graduates from the program. Dimitrios has not attended reab since 08/11/2023 and has been on medical hold. He reported that he does want to return and complete the program. We will continue to monitor his progress when he returns. Theron has not attended reab since 08/11/2023 and has been on medical hold. He reported that he does want to return and complete the program. We will continue to monitor his progress when he returns. Nemesio has  not attended reab since 08/11/2023 and has been on medical hold. He reported that he does want to return and complete the program. We will continue to monitor his progress when he returns. Griffyn returned for one session on 10/27/2023. He did well on the treadmill at  2.1 mph with no incline. He also did well at level 3 on the T4 nustep. We will continue to monitor his progress in the program.   Expected Outcomes Short: Return to rehab and graduate. Long: Continue to exercise independently. Short: Return to rehab and graduate. Long: Continue to exercise independently. Short: Return to rehab when appropriate. Long: Graduate. Short: Return to rehab when appropriate. Long: Graduate. Short: Attend rehab more consistently. Long: Continue exercise to improve strength and stamina.      Discharge Exercise Prescription (Final Exercise Prescription Changes):  Exercise Prescription Changes - 11/05/23 0900       Response to Exercise   Blood Pressure (Admit) 128/62    Blood Pressure (Exercise) 128/74    Blood Pressure (Exit) 122/82    Heart Rate (Admit) 72 bpm    Heart Rate (Exercise) 90 bpm    Heart Rate (Exit) 78 bpm    Rating of Perceived Exertion (Exercise) 13    Symptoms none    Duration Continue with 30 min of aerobic exercise without signs/symptoms of physical distress.    Intensity THRR unchanged      Progression   Progression Continue to progress workloads to maintain intensity without signs/symptoms of physical distress.    Average METs 2.81      Resistance Training   Training Prescription Yes    Weight 8 lb    Reps 10-15      Interval Training   Interval Training No      Treadmill   MPH 2.1    Grade 0    Minutes 15    METs 2.61      NuStep   Level 3    Minutes 15    METs 3      Home Exercise Plan   Plans to [facility] exercise at [facility]   Pt plans to join [facility] for aerobic equipment, Walking dog around 1 mile a day   Frequency Add 2 additional days to program exercise sessions.    Initial Home Exercises Provided 06/30/23      Oxygen   Maintain Oxygen Saturation 88% or higher          Nutrition:  Target Goals: Understanding of nutrition guidelines, daily intake of sodium 1500mg , cholesterol 200mg , calories 30%  from fat and 7% or less from saturated fats, daily to have 5 or more servings of fruits and vegetables.  Education: All About Nutrition: -Group instruction provided by verbal, written material, interactive activities, discussions, models, and posters to present general guidelines for heart healthy nutrition including fat, fiber, MyPlate, the role of sodium in heart healthy nutrition, utilization of the nutrition label, and utilization of this knowledge for meal planning. Follow up email sent as well. Written material given at graduation. Flowsheet Row Cardiac Rehab from 07/30/2023 in [facility] Cardiac and Pulmonary Rehab  Date 07/30/23  Educator JG  Instruction Review Code 1- Verbalizes Understanding    Biometrics:  Pre Biometrics - 05/19/23 1531       Pre Biometrics   Height 5' 5.5 (1.664 m)    Weight 148 lb 3.2 oz (67.2 kg)    Waist Circumference 35 inches    Hip Circumference 38 inches    Waist to  Hip Ratio 0.92 %    BMI (Calculated) 24.28    Single Leg Stand 2.47 seconds          Post Biometrics - 08/07/23 1402        Post  Biometrics   Height 5' 5.5 (1.664 m)    Weight 157 lb 3.2 oz (71.3 kg)    Waist Circumference 33 inches    Hip Circumference 36 inches    Waist to Hip Ratio 0.92 %    BMI (Calculated) 25.75    Single Leg Stand 6.52 seconds          Nutrition Therapy Plan and Nutrition Goals:  Nutrition Therapy & Goals - 05/19/23 1621       Nutrition Therapy   Diet Cardiac, low Na    Protein (specify units) 60-75g    Fiber 30 grams    Whole Grain Foods 3 servings    Saturated Fats 15 max. grams    Fruits and Vegetables 5 servings/day    Sodium 2 grams      Personal Nutrition Goals   Nutrition Goal Drink 64oz of water daily    Personal Goal #2 Eat 15-20gProtein and 30-60gCarbs at each meal.    Personal Goal #3 Read labels and reduce sodium intake to below 2300mg . Ideally 1500mg  per day.    Comments Patient recovering from hospital stay and reports appetite  has improved a lot since his inpatient stay, says it could still use more improvement. Spoke today about ways to meet nutritional needs with less quantity of foods, since her reports some nausea and early satiety. Encouraged him to eat a quality protein ~15-20g per meal paired with a complex carb. Reviewed some good pairings and answered questions around protein, types of fats, sodium, and sugar. Reviewed a few facts labels and provided advice on how to read labels and determine if the foods are valuable for his nutritional goals. Encouraged efficient foods like beans, lentils, and hummus. Healthy fat rich foods like peanuts, peanut butter, fish, and olive oil to increase calories with less quantity of food. Wanted to set goal of ~64oz of water daily, reminded him to ensure water intake doesn't further reduce food intake, suggested he sip on water between meal and keep water at meal to what is needed to enjoy the meal and ensure safe swallowing when appetite is poor. He is optimistic that his appetite will improve and he will be able to eat more normally. Will continue to monitor and support      Intervention Plan   Intervention Prescribe, educate and counsel regarding individualized specific dietary modifications aiming towards targeted core components such as weight, hypertension, lipid management, diabetes, heart failure and other comorbidities.;Nutrition handout(s) given to patient.    Expected Outcomes Short Term Goal: Understand basic principles of dietary content, such as calories, fat, sodium, cholesterol and nutrients.;Short Term Goal: A plan has been developed with personal nutrition goals set during dietitian appointment.;Long Term Goal: Adherence to prescribed nutrition plan.          Nutrition Assessments:  MEDIFICTS Score Key: >=70 Need to make dietary changes  40-70 Heart Healthy Diet <= 40 Therapeutic Level Cholesterol Diet  Flowsheet Row Cardiac Rehab from 05/19/2023 in [facility] Cardiac  and Pulmonary Rehab  Picture Your Plate Total Score on Admission 75   Picture Your Plate Scores: <59 Unhealthy dietary pattern with much room for improvement. 41-50 Dietary pattern unlikely to meet recommendations for good health and room for improvement. 51-60 More healthful dietary pattern,  with some room for improvement.  >60 Healthy dietary pattern, although there may be some specific behaviors that could be improved.    Nutrition Goals Re-Evaluation:  Nutrition Goals Re-Evaluation     Row Name 06/11/23 1417 07/10/23 1423 07/30/23 1354         Goals   Nutrition Goal -- -- Drink 64oz of water daily     Comment Toshiyuki reports his appetite is improving and he is tolerating food better. Remembers discussed with RD about meeting protein goals and limiting sodium and saturated fat. Reviewed some good snack ideas with healthy fats, complex carbs and protein rich foods. Nicolaus states that he is doing well with his diet. He has been drinking plenty of water every day. He is still working on getting plenty of protein in his diet. He is also trying to limit sugars and sodium in his diet. He feels he is overall doing well with the dietary guidelines he discussed with the RD. Nylan states that he is making an effort to drink more water each day. He feels comfortable reading food labels and does not add salt at the table. He has been watching his sodium intake for many years and reports that he feels he is doing well with this. He is trying to incorporate more protein into his diet and includes meat, beans, gin pro protein powder.     Expected Outcome STG: continue to eat heart healthy and focus on recovering appetite. LTG: maintain heart healthy lifestyle Short: Continue to work on getting protein and limiting sodium. Long: Continue to practice heart healthy eating patterns discussed with RD. Short: continue to read food lables and make sure he is getting enough protein each day and limiting sodium. Long:  continue to follow heart healthy diet.       Personal Goal #2 Re-Evaluation   Personal Goal #2 -- -- Eat 15-20gProtein and 30-60gCarbs at each meal.       Personal Goal #3 Re-Evaluation   Personal Goal #3 -- -- Read labels and reduce sodium intake to below 2300mg . Ideally 1500mg  per day.        Nutrition Goals Discharge (Final Nutrition Goals Re-Evaluation):  Nutrition Goals Re-Evaluation - 07/30/23 1354       Goals   Nutrition Goal Drink 64oz of water daily    Comment Miguelangel states that he is making an effort to drink more water each day. He feels comfortable reading food labels and does not add salt at the table. He has been watching his sodium intake for many years and reports that he feels he is doing well with this. He is trying to incorporate more protein into his diet and includes meat, beans, gin pro protein powder.    Expected Outcome Short: continue to read food lables and make sure he is getting enough protein each day and limiting sodium. Long: continue to follow heart healthy diet.      Personal Goal #2 Re-Evaluation   Personal Goal #2 Eat 15-20gProtein and 30-60gCarbs at each meal.      Personal Goal #3 Re-Evaluation   Personal Goal #3 Read labels and reduce sodium intake to below 2300mg . Ideally 1500mg  per day.          Psychosocial: Target Goals: Acknowledge presence or absence of significant depression and/or stress, maximize coping skills, provide positive support system. Participant is able to verbalize types and ability to use techniques and skills needed for reducing stress and depression.   Education: Stress, Anxiety, and Depression -  Group verbal and visual presentation to define topics covered.  Reviews how body is impacted by stress, anxiety, and depression.  Also discusses healthy ways to reduce stress and to treat/manage anxiety and depression.  Written material given at graduation. Flowsheet Row Cardiac Rehab from 07/30/2023 in [facility] Cardiac and Pulmonary  Rehab  Date 07/02/23  Educator [facility]  Instruction Review Code 1- [employer] Understanding    Education: Sleep Hygiene -Provides group verbal and written instruction about how sleep can affect your health.  Define sleep hygiene, discuss sleep cycles and impact of sleep habits. Review good sleep hygiene tips.    Initial Review & Psychosocial Screening:   Quality of Life Scores:   Quality of Life - 05/19/23 1533       Quality of Life   Select Quality of Life      Quality of Life Scores   Health/Function Pre 24.43 %    Socioeconomic Pre 29.58 %    Psych/Spiritual Pre 24 %    Family Pre 30 %    GLOBAL Pre 26.12 %         Scores of 19 and below usually indicate a poorer quality of life in these areas.  A difference of  2-3 points is a clinically meaningful difference.  A difference of 2-3 points in the total score of the Quality of Life Index has been associated with significant improvement in overall quality of life, self-image, physical symptoms, and general health in studies assessing change in quality of life.  PHQ-9: Review Flowsheet  More data exists      05/19/2023 01/09/2022 09/26/2021 08/21/2021 10/17/2020  Depression screen PHQ 2/9  Decreased Interest 1 0 0 0 0  Down, Depressed, Hopeless 0 0 0 0 0  PHQ - 2 Score 1 0 0 0 0  Altered sleeping 1 0 0 1 0  Tired, decreased energy 1 1 0 2 1  Change in appetite 1 0 0 0 1  Feeling bad or failure about yourself  0 0 0 0 0  Trouble concentrating 0 0 0 0 0  Moving slowly or fidgety/restless 0 0 0 0 0  Suicidal thoughts 0 0 0 0 0  PHQ-9 Score 4 1 0 3 2  Difficult doing work/chores Somewhat difficult Not difficult at all Not difficult at all Not difficult at all Not difficult at all   Interpretation of Total Score  Total Score Depression Severity:  1-4 = Minimal depression, 5-9 = Mild depression, 10-14 = Moderate depression, 15-19 = Moderately severe depression, 20-27 = Severe depression   Psychosocial Evaluation and  Intervention:   Psychosocial Re-Evaluation:  Psychosocial Re-Evaluation     Row Name 06/11/23 1415 07/10/23 1421 07/30/23 1409         Psychosocial Re-Evaluation   Current issues with None Identified None Identified None Identified     Comments Mumin reports a good mindset and is not exeperiencing any stress, anxiety or depression. He says he has a good support system with family. Says he is sleeping well, wakes up well rested. Nichalos reportd no major stressors at this time. He enjoys walking and woodworking for stress relief. He has a good support system around him made up primarily of his wife and two daughters. Andros reports no issues with sleep at this time. Sohaib reports that he has no concerns with his stress levels or mental health at this time. He states that he has a stable support system. His only concerns is his sleep patterns. He has inconsistent sleep  patterns and has been working with his doctor on this. He is in the process of getting set up with a CPAP. He does complain of having brain fog during the day but not really feeling tired. He will continue to work with his doctor to get set up with his CPAP to see if this helps his quality of sleep and day time brain fog.     Expected Outcomes STG: continue to use support system and attend cardiac rehab and education classes. LTG: maintain positive outlook on health and lifestyle changes Short: Continue to exercise for stress relief. Long: Continue to maintain positive outlook. Short: get set up with CPAP machine. Long: get into healhty sleep patterns and maintian this long term.     Interventions Encouraged to attend Cardiac Rehabilitation for the exercise Encouraged to attend Cardiac Rehabilitation for the exercise Encouraged to attend Cardiac Rehabilitation for the exercise     Continue Psychosocial Services  Follow up required by staff Follow up required by staff Follow up required by staff        Psychosocial Discharge (Final  Psychosocial Re-Evaluation):  Psychosocial Re-Evaluation - 07/30/23 1409       Psychosocial Re-Evaluation   Current issues with None Identified    Comments Thorin reports that he has no concerns with his stress levels or mental health at this time. He states that he has a stable support system. His only concerns is his sleep patterns. He has inconsistent sleep patterns and has been working with his doctor on this. He is in the process of getting set up with a CPAP. He does complain of having brain fog during the day but not really feeling tired. He will continue to work with his doctor to get set up with his CPAP to see if this helps his quality of sleep and day time brain fog.    Expected Outcomes Short: get set up with CPAP machine. Long: get into healhty sleep patterns and maintian this long term.    Interventions Encouraged to attend Cardiac Rehabilitation for the exercise    Continue Psychosocial Services  Follow up required by staff          Vocational Rehabilitation: Provide vocational rehab assistance to qualifying candidates.   Vocational Rehab Evaluation & Intervention:   Education: Education Goals: Education classes will be provided on a variety of topics geared toward better understanding of heart health and risk factor modification. Participant will state understanding/return demonstration of topics presented as noted by education test scores.  Learning Barriers/Preferences:   General Cardiac Education Topics:  AED/CPR: - Group verbal and written instruction with the use of models to demonstrate the basic use of the AED with the basic ABC's of resuscitation.   Anatomy and Cardiac Procedures: - Group verbal and visual presentation and models provide information about basic cardiac anatomy and function. Reviews the testing methods done to diagnose heart disease and the outcomes of the test results. Describes the treatment choices: Medical Management, Angioplasty, or  Coronary Bypass Surgery for treating various heart conditions including Myocardial Infarction, Angina, Valve Disease, and Cardiac Arrhythmias.  Written material given at graduation.   Medication Safety: - Group verbal and visual instruction to review commonly prescribed medications for heart and lung disease. Reviews the medication, class of the drug, and side effects. Includes the steps to properly store meds and maintain the prescription regimen.  Written material given at graduation. Flowsheet Row Cardiac Rehab from 07/30/2023 in [facility] Cardiac and Pulmonary Rehab  Date 06/11/23  Educator SB  Instruction Review Code 1- Verbalizes Understanding    Intimacy: - Group verbal instruction through game format to discuss how heart and lung disease can affect sexual intimacy. Written material given at graduation..   Know Your Numbers and Heart Failure: - Group verbal and visual instruction to discuss disease risk factors for cardiac and pulmonary disease and treatment options.  Reviews associated critical values for Overweight/Obesity, Hypertension, Cholesterol, and Diabetes.  Discusses basics of heart failure: signs/symptoms and treatments.  Introduces Heart Failure Zone chart for action plan for heart failure.  Written material given at graduation. Flowsheet Row Cardiac Rehab from 07/30/2023 in [facility] Cardiac and Pulmonary Rehab  Education need identified 05/19/23  Date 06/18/23  Educator SB  Instruction Review Code 1- Verbalizes Understanding    Infection Prevention: - Provides verbal and written material to individual with discussion of infection control including proper hand washing and proper equipment cleaning during exercise session. Flowsheet Row Cardiac Rehab from 07/30/2023 in [facility] Cardiac and Pulmonary Rehab  Date 05/19/23  Educator [facility]  Instruction Review Code 1- Verbalizes Understanding    Falls Prevention: - Provides verbal and written material to individual with discussion of falls  prevention and safety. Flowsheet Row Cardiac Rehab from 07/30/2023 in [facility] Cardiac and Pulmonary Rehab  Date 05/19/23  Educator [facility]  Instruction Review Code 1- Verbalizes Understanding    Other: -Provides group and verbal instruction on various topics (see comments)   Knowledge Questionnaire Score:  Knowledge Questionnaire Score - 05/19/23 1533       Knowledge Questionnaire Score   Pre Score 24/26          Core Components/Risk Factors/Patient Goals at Admission:  Personal Goals and Risk Factors at Admission - 05/19/23 1532       Core Components/Risk Factors/Patient Goals on Admission    Weight Management Yes    Intervention Weight Management: Provide education and appropriate resources to help participant work on and attain dietary goals.;Weight Management: Develop a combined nutrition and exercise program designed to reach desired caloric intake, while maintaining appropriate intake of nutrient and fiber, sodium and fats, and appropriate energy expenditure required for the weight goal.    Admit Weight 148 lb 3.2 oz (67.2 kg)    Goal Weight: Short Term 148 lb (67.1 kg)    Goal Weight: Long Term 148 lb (67.1 kg)    Expected Outcomes Short Term: Continue to assess and modify interventions until short term weight is achieved;Long Term: Adherence to nutrition and physical activity/exercise program aimed toward attainment of established weight goal;Weight Maintenance: Understanding of the daily nutrition guidelines, which includes 25-35% calories from fat, 7% or less cal from saturated fats, less than 200mg  cholesterol, less than 1.5gm of sodium, & 5 or more servings of fruits and vegetables daily;Understanding recommendations for meals to include 15-35% energy as protein, 25-35% energy from fat, 35-60% energy from carbohydrates, less than 200mg  of dietary cholesterol, 20-35 gm of total fiber daily;Understanding of distribution of calorie intake throughout the day with the consumption of 4-5  meals/snacks    Heart Failure Yes    Intervention Provide a combined exercise and nutrition program that is supplemented with education, support and counseling about heart failure. Directed toward relieving symptoms such as shortness of breath, decreased exercise tolerance, and extremity edema.    Expected Outcomes Improve functional capacity of life;Short term: Attendance in program 2-3 days a week with increased exercise capacity. Reported lower sodium intake. Reported increased fruit and vegetable intake. Reports medication compliance.;Short term: Daily weights  obtained and reported for increase. Utilizing diuretic protocols set by physician.;Long term: Adoption of self-care skills and reduction of barriers for early signs and symptoms recognition and intervention leading to self-care maintenance.    Hypertension Yes    Intervention Provide education on lifestyle modifcations including regular physical activity/exercise, weight management, moderate sodium restriction and increased consumption of fresh fruit, vegetables, and low fat dairy, alcohol moderation, and smoking cessation.;Monitor prescription use compliance.    Expected Outcomes Short Term: Continued assessment and intervention until BP is < 140/78mm HG in hypertensive participants. < 130/64mm HG in hypertensive participants with diabetes, heart failure or chronic kidney disease.;Long Term: Maintenance of blood pressure at goal levels.    Lipids Yes    Intervention Provide education and support for participant on nutrition & aerobic/resistive exercise along with prescribed medications to achieve LDL 70mg , HDL >40mg .    Expected Outcomes Short Term: Participant states understanding of desired cholesterol values and is compliant with medications prescribed. Participant is following exercise prescription and nutrition guidelines.;Long Term: Cholesterol controlled with medications as prescribed, with individualized exercise RX and with personalized  nutrition plan. Value goals: LDL < 70mg , HDL > 40 mg.          Education:Diabetes - Individual verbal and written instruction to review signs/symptoms of diabetes, desired ranges of glucose level fasting, after meals and with exercise. Acknowledge that pre and post exercise glucose checks will be done for 3 sessions at entry of program.   Core Components/Risk Factors/Patient Goals Review:   Goals and Risk Factor Review     Row Name 06/11/23 1420 07/10/23 1426 07/30/23 1359         Core Components/Risk Factors/Patient Goals Review   Personal Goals Review Weight Management/Obesity Weight Management/Obesity;Hypertension Heart Failure;Hypertension;Lipids     Review He lost a lot of weight after surgery, appetite and taste were gone. This has improved and he reports he has gained back ~5lbs since starting rehab. Commended him on his recovery and reminded him of caloire and nutrient dense foods to add to meals and snacks. He states that his weight has been maintained around 155-160 lb. He states that he is more interested in gaining muscle mass more than he is the number on the scale. He also reports that he checks his BP at home routinely. He also states that his BP readings have been good and within normal ranges when he checks it. Laquon states that he continues to try to gain muscle mass and maintain weight. He takes all his cholesterol meds and follows up with his doctor with lab work to manage this. He checks his weight and blood pressure at home. He states he is aware of the signs of heart failure and what to look out for. His BP and weight have been steady.     Expected Outcomes STG: increase strength, weight stabilization. LTG: maintain and achieve strong healthy weight Short: Continue to work towards gaining muscle mass. Long: Continue to manage lifestyle risk factors. Short: cotninue to work towards building muscle mass and strength. continue to monitor weight and BP at home. Long: continue to  control cardiac risk ractors.        Core Components/Risk Factors/Patient Goals at Discharge (Final Review):   Goals and Risk Factor Review - 07/30/23 1359       Core Components/Risk Factors/Patient Goals Review   Personal Goals Review Heart Failure;Hypertension;Lipids    Review Almalik states that he continues to try to gain muscle mass and maintain weight. He takes all  his cholesterol meds and follows up with his doctor with lab work to manage this. He checks his weight and blood pressure at home. He states he is aware of the signs of heart failure and what to look out for. His BP and weight have been steady.    Expected Outcomes Short: cotninue to work towards building muscle mass and strength. continue to monitor weight and BP at home. Long: continue to control cardiac risk ractors.          ITP Comments:  ITP Comments     Row Name 05/19/23 1524 05/21/23 1310 05/21/23 1505 06/18/23 1127 07/16/23 1027   ITP Comments Completed and gym orientation. Initial ITP created and sent for review to Dr. Oneil Pinal, Medical Director. 30 Day review completed. Medical Director ITP review done, changes made as directed, and signed approval by Medical Director.    new to program First full day of exercise!  Patient was oriented to gym and equipment including functions, settings, policies, and procedures.  Patient's individual exercise prescription and treatment plan were reviewed.  All starting workloads were established based on the results of the 6 minute walk test done at initial orientation visit.  The plan for exercise progression was also introduced and progression will be customized based on patient's performance and goals. 30 Day review completed. Medical Director ITP review done, changes made as directed, and signed approval by Medical Director.    new to program 30 Day review completed. Medical Director ITP review done, changes made as directed, and signed approval by Medical Director.    Row  Name 08/13/23 0821 09/10/23 1318 10/08/23 0952 11/05/23 0945     ITP Comments 30 Day review completed. Medical Director ITP review done, changes made as directed, and signed approval by Medical Director. 30 Day review completed. Medical Director ITP review done, changes made as directed, and signed approval by Medical Director.    out medical reason  since 4/7 30 Day review completed. Medical Director ITP review done, changes made as directed, and signed approval by Medical Director.    out since 4/7 30 Day review completed. Medical Director ITP review done, changes made as directed, and signed approval by Medical Director.       Comments: 30 day review

## 2023-11-05 NOTE — Patient Instructions (Signed)
 Medication Instructions:  -hold eliquis  2 days prior to cardiac cath   -pre-cath contrast allergy meds *If you need a refill on your cardiac medications before your next appointment, please call your pharmacy*  Lab Work: -no pre cath labs needed  If you have labs (blood work) drawn today and your tests are completely normal, you will receive your results only by: MyChart Message (if you have MyChart) OR A paper copy in the mail If you have any lab test that is abnormal or we need to change your treatment, we will call you to review the results.  Testing/Procedures:  [place] [facility] DEPT OF [name] [place] [address] [place] [address] Dept: [phone] Loc: [phone]  Thomas [name]  11/05/2023  You are scheduled for a Cardiac Catheterization on Monday, July 7 with Dr. [name].  1. Please arrive at the [facility] Entrance of [facility], [address], [place] [address] at 9:30 AM (This is 1 hour(s) prior to your procedure time).  Proceed to the Check-In Desk directly inside the entrance.  Procedure Parking: Use the entrance off of the [address] side of the hospital. Turn right upon entering and follow the driveway to parking that is directly in front of the [facility]. There is no valet parking available at this entrance, however there is an awning directly in front of the [facility] Center for drop off/ pick up for patients.  Special note: Every effort is made to have your procedure done on time. Please understand that emergencies sometimes delay scheduled procedures.  2. Diet: Do not eat solid foods after midnight.  The patient may have clear liquids until 5am upon the day of the procedure.  4. Medication instructions in preparation for your procedure: -hold eliquis  2 days prior to procedure (last dose Friday night)   Contrast Allergy: Yes,  Please take Prednisone  50mg  by mouth at dinner the night before the cath, before bedtime the night before the cath, and prior to leaving home please take last dose of Prednisone  50mg  and Benadryl  50mg  by mouth.  On the morning of your procedure, take your Aspirin  81 mg and any morning medicines NOT listed above.  You may use sips of water.  5. Plan to go home the same day, you will only stay overnight if medically necessary. 6. Bring a current list of your medications and current insurance cards. 7. You MUST have a responsible person to drive you home. 8. Someone MUST be with you the first 24 hours after you arrive home or your discharge will be delayed. 9. Please wear clothes that are easy to get on and off and wear slip-on shoes.  Thank you for allowing us  to care for you!   -- [facility]   Follow-Up: At [facility], you and your health needs are our priority.  As part of our continuing mission to provide you with exceptional heart care, our providers are all part of one team.  This team includes your primary Cardiologist (physician) and Advanced Practice Providers or APPs (Physician Assistants and Nurse Practitioners) who all work together to provide you with the care you need, when you need it.  Your next appointment:   3 week(s)  Provider:   You may see [name], MD or one of the following Advanced Practice Providers on your designated Care Team:   Lonni Meager,  NP  We recommend signing up for the patient portal called MyChart.  Sign up information is provided on this After Visit Summary.  MyChart is used to connect with patients for Virtual Visits (Telemedicine).  Patients are able to view lab/test results, encounter notes, upcoming appointments, etc.  Non-urgent messages can be sent to your provider as well.   To learn more about what you can do with MyChart, go to [email or web address].

## 2023-11-05 NOTE — H&P (View-Only) (Signed)
 Office Visit    Patient Name: Thomas [name] Date of Encounter: 11/05/2023  Primary Care Provider:  System, Provider Not In Primary Cardiologist:  [name], MD  Chief Complaint    89 y.o. male with a history of nonobstructive CAD, complete heart block, nonischemic cardiomyopathy in the setting of prolonged RV pacing status post CRT-P (2024), chronic heart failure with midrange ejection fraction, moderate MR/AI, hypertension, hyperlipidemia, paroxysmal atrial fibrillation and flutter on Tikosyn  and Eliquis , TIA/stroke, and CKD III status post partial nephrectomy due to renal cell carcinoma, who presents for f/u related to unstable angina.  Past Medical History   Subjective   Past Medical History:  Diagnosis Date   Aortic insufficiency    a. 07/2023 Echo: Mod AI.   Aortic valve disorders    Arthritis    Basal cell carcinoma    face, nose left shoulder, left arm (06/19/2016)   Basal cell carcinoma 09/13/2020   right temple   BBB (bundle branch block)    hx right   CAD (coronary artery disease)    a. 10/2017 Cath: mod, nonobs dzs; b. 02/2021 Cath: mod, nonobs dzs; c. 11/2022 Cath: LAD 30ost/p, 53m, D1 min irregs, LCX 65p/m, OM1 min irregs, OM2 40, OM3 nl, RCA 30p, 2m, 40d w/ 40 in side branch-->Med rx.   Chronic back pain    neck, thoracic, lower back (06/19/2016)   CKD (chronic kidney disease), stage III (HCC)    Complete heart block (HCC) 06/2016   a. 06/2016 s/p MDT PPM; b. 01/2023 s/p upgrade to MDT CRT-P (ser # [id number]).   GERD (gastroesophageal reflux disease)    Gout    Heart block    I've had type I, II Wenke before now (06/19/2016)   History of gout    History of hiatal hernia    self dx'd (06/19/2016)   Hyperlipidemia    Hypertension    Lyme disease    dx'd by me 2003; cx's showed dx 08/2015   Migraine    3-4/year (06/19/2016)   Mitral regurgitation    a. 07/2023 Echo: Mod MR.   NICM (nonischemic cardiomyopathy - in setting of RV pacing) (HCC)     a. 02/2019 Echo: EF 45-50%; b. 10/2022 Echo: EF 30-35%; c. 01/2023 CRT-P upgrade; e. 07/2023 Echo: EF 40-45%, glob HK, GrI DD, nl RV size/fxn, RVSP 42.2 mmHg, mildly dil LA, mod MR, mod AI.   PAF (paroxysmal atrial fibrillation) (HCC)    a. CHA2DS2VASc = 7--> eliquis ; b. On tikosyn .   Paroxysmal atrial flutter (HCC)    Presence of permanent cardiac pacemaker    PVC's (premature ventricular contractions)    Renal cancer, left (HCC) 2006   S/P cryotherapy   Spinal stenosis    cervical, 1 thoracic, lumbar (06/19/2016)   Squamous carcinoma    face, nose left shoulder, left arm (06/19/2016)   Stroke (HCC)    TIA (transient ischemic attack) 06/14/2016   I'm not sure that's what it was (06/25/2016)   Visit for monitoring Tikosyn  therapy 09/09/2017   Past Surgical History:  Procedure Laterality Date   ANKLE FRACTURE SURGERY Right 1967   BACK SURGERY  05/07/2020   BASAL CELL CARCINOMA EXCISION     face, nose left shoulder, left arm   BIOPSY PROSTATE  2001 & 2003   BIV UPGRADE N/A 01/10/2023   Procedure: BIV UPGRADE;  Surgeon: Fernande Elspeth BROCKS, MD;  Location: [facility] IN[facility]ASIVE [facility];  Service: Cardiovascular;  Laterality: N/A;   CARDIAC CATHETERIZATION  1990's  CARDIOVERSION N/A 09/11/2017   Procedure: CARDIOVERSION;  Surgeon: Pietro Redell RAMAN, MD;  Location: [facility] ENDOSCOPY;  Service: Cardiovascular;  Laterality: N/A;   FRACTURE SURGERY     HOLEP-LASER ENUCLEATION OF THE PROSTATE WITH MORCELLATION N/A 07/10/2020   Procedure: HOLEP-LASER ENUCLEATION OF THE PROSTATE WITH MORCELLATION;  Surgeon: Penne Knee, MD;  Location: [facility] ORS;  Service: Urology;  Laterality: N/A;   INGUINAL HERNIA REPAIR Left 2012   INSERT / REPLACE / REMOVE PACEMAKER  06/19/2016   LAPAROSCOPIC ABLATION RENAL MASS     LEFT HEART CATH AND CORONARY ANGIOGRAPHY Left 10/23/2017   Procedure: LEFT HEART CATH AND CORONARY ANGIOGRAPHY;  Surgeon: Darron Deatrice LABOR, MD;  Location: [facility] INVASIVE CV LAB;  Service: Cardiovascular;   Laterality: Left;   LEFT HEART CATH AND CORONARY ANGIOGRAPHY Left 02/26/2021   Procedure: LEFT HEART CATH AND CORONARY ANGIOGRAPHY;  Surgeon: Darron Deatrice LABOR, MD;  Location: [facility] INVASIVE CV LAB;  Service: Cardiovascular;  Laterality: Left;   PACEMAKER IMPLANT N/A 06/19/2016   Procedure: Pacemaker Implant;  Surgeon: Elspeth JAYSON Sage, MD;  Location: [facility] INVASIVE CV LAB;  Service: Cardiovascular;  Laterality: N/A;   pacemasker     PROSTATE SURGERY     RIGHT/LEFT HEART CATH AND CORONARY ANGIOGRAPHY Bilateral 11/25/2022   Procedure: RIGHT/LEFT HEART CATH AND CORONARY ANGIOGRAPHY;  Surgeon: Darron Deatrice LABOR, MD;  Location: [facility] INVASIVE CV LAB;  Service: Cardiovascular;  Laterality: Bilateral;   SQUAMOUS CELL CARCINOMA EXCISION     face, nose left shoulder, left arm   TONSILLECTOMY AND ADENOIDECTOMY      Allergies  Allergies  Allergen Reactions   Other Other (See Comments)    Vicryl sutures - patient states that site becomes soupy   Baclofen     Severe delirium    Iodinated Contrast Media Hives   Iodine Hives and Other (See Comments)    IVP contrast   Penicillins Itching, Rash and Other (See Comments)    ITCHY FEELING IN FINGERS; Tolerated Zosyn  04/2023       History of Present Illness      89 y.o. y/o male with the above past medical history including nonobstructive CAD, complete heart block, nonischemic cardiomyopathy, chronic heart failure with midrange ejection fraction, moderate MR/AI, hypertension, hyperlipidemia, paroxysmal atrial fibrillation and flutter, TIA/stroke, and CKD 3 status post partial nephrectomy in the setting of left renal cell carcinoma.  In the setting of admission for stroke, he was noted to have atrial fibrillation and complete heart block.  He underwent permanent pacemaker placement in February 2018.  He was also diagnosed with atrial fibrillation and subsequently underwent Tikosyn  loading.  Has been chronically coagulated with Eliquis .  Coronary CT angiogram  in May 2019 showed a calcium  score of 1366 with possible significant LAD disease.  He underwent diagnostic catheterization which showed moderate, nonobstructive three-vessel disease and medical therapy was recommended.  In June 2024, he was noted to have a drop in EF to 30-35% with global hypokinesis.  This was down from 45-50% in 2021.  He also complained of exertional chest discomfort.  He underwent diagnostic catheterization in July 2024 which showed stable three-vessel CAD with moderately calcified coronary arteries.  Right heart cath showed normal filling pressures and normal cardiac output.  Medical therapy was recommended with no FFR of the RCA and circumflex with possible PCI could be considered in the future for refractory angina.  In the interim, he was referred back to electrophysiology in the setting of persistent RV pacing and new cardiomyopathy.  He underwent  CRT-P in September 2024.  Follow-up echocardiogram in March 2025 showed some improvement in EF to 40-45% with RVSP 42.2 mmHg, moderate MR and moderate AI.     Mr. Vallery was seen in clinic in May 2025, at which time he reported a 1 month history of exertional midsternal chest burning that sometimes involves his left arm, occurring while walking his dog, and resolving with rest or slowing of his pace.  After prolonged discussion and consideration for repeat catheterization with FFR measurement (has a contrast allergy and CKD 3-4), we mutually agreed to pursue a medical therapy approach for send Imdur  15 mg daily was added.  Unfortunately, following initiation of Imdur , he has not noticed any significant change in his exertional symptoms.  He does not necessarily experience chest discomfort when he is doing lighter activities such as kneeling and using his arms while working in his garden but when he walks his dog, he will develop increasing substernal chest tightness followed by radiation to his arms, associated with mild dyspnea,   prompting him to slow down.  Symptoms will resolve within 5 to 15 minutes of slowing or stopping.  He does not typically have rest symptoms but did have an episode of chest tightness after palpating a location along the left side of his sternum last night.  Though symptoms resolved within 10 to 15 minutes.    He was admitted on October 28, 2023 due to sudden fatigue and mild left-sided clumsiness noted after working outside all day.  He thought he probably just overdid it and was overheated but was concerned about possible stroke.  MRI/a of the brain showed no acute stroke with mild to moderate stenoses of the P2 segments of the posterior cerebral arteries bilaterally and mild stenosis of the right ICA terminus in the distal left M1 segment.  Creatinine was slightly above baseline at 2.0 and improved to 1.6 by discharge.  Echo was performed during hospitalization and showed an EF of 55 to 60% with grade 1 diastolic dysfunction, normal RV function, trivial MR, mild to moderate AI, and mildly dilated ascending aorta at 42 mm.  He was subsequently discharged in June 26.  He was placed on a course of doxycycline  in the setting of a tick bite noted during hospitalization.  He has not had any recurrent weakness since discharge.  He denies palpitations, PND, orthopnea, dizziness, syncope, edema, or early satiety.   Objective   Home Medications    Current Outpatient Medications  Medication Sig Dispense Refill   acetaminophen  (TYLENOL ) 500 MG tablet Take 500-1,000 mg by mouth every 6 (six) hours as needed for mild pain (pain score 1-3) or fever.     apixaban  (ELIQUIS ) 2.5 MG TABS tablet Take 1 tablet (2.5 mg total) by mouth 2 (two) times daily. 90 tablet 3   Artificial Tears ophthalmic solution Place 1 drop into both eyes in the morning, at noon, in the evening, and at bedtime.     atorvastatin  (LIPITOR) 80 MG tablet Take 1 tablet (80 mg total) by mouth daily. 30 tablet 1   buPROPion  (WELLBUTRIN  XL) 150 MG 24 hr  tablet Take 150 mg by mouth daily.     calcium  carbonate (TUMS - DOSED IN MG ELEMENTAL CALCIUM ) 500 MG chewable tablet Chew 1-2 tablets by mouth daily as needed for indigestion.     Coenzyme Q10 (COQ10) 100 MG CAPS Take 100 mg by mouth daily.     docusate sodium  (COLACE) 100 MG capsule Take 100 mg by mouth daily  as needed for mild constipation.     dofetilide  (TIKOSYN ) 125 MCG capsule TAKE 1 CAPSULE BY MOUTH TWICE DAILY 180 capsule 2   doxycycline  (VIBRA -TABS) 100 MG tablet Take 100 mg by mouth daily.     DULoxetine  (CYMBALTA ) 20 MG capsule Take 20 mg by mouth 2 (two) times daily.     febuxostat  (ULORIC ) 40 MG tablet Take 1 tablet (40 mg total) by mouth daily. 90 tablet 3   isosorbide  mononitrate (IMDUR ) 30 MG 24 hr tablet Take 0.5 tablets (15 mg total) by mouth daily. 30 tablet 3   ketoconazole  (NIZORAL ) 2 % cream Apply to the feet every night to prevent fungal infection. 60 g 11   meclizine (ANTIVERT) 12.5 MG tablet Take 12.5 mg by mouth 3 (three) times daily.     metoprolol  succinate (TOPROL -XL) 50 MG 24 hr tablet TAKE 1 TABLET BY MOUTH DAILY WITH OR IMMEDIATELY FOLLOWING A MEAL (Patient taking differently: Take 50 mg by mouth at bedtime.) 90 tablet 3   metroNIDAZOLE  (FLAGYL ) 500 MG tablet TAKE 1 TABLET BY MOUTH TWICE DAILY ON SUNDAY AND MONDAY. 90 tablet 3   NEEDLE, DISP, 18 G (BD DISP NEEDLES) 18G X 1-1/2 MISC 1 mg by Does not apply route every 14 (fourteen) days. 50 each 0   NEEDLE, DISP, 21 G (BD DISP NEEDLES) 21G X 1-1/2 MISC 1 mg by Does not apply route every 14 (fourteen) days. 50 each 0   nitroGLYCERIN  (NITROSTAT ) 0.4 MG SL tablet Place 1 tablet (0.4 mg total) under the tongue every 5 (five) minutes as needed for chest pain. 25 tablet 3   omeprazole (PRILOSEC) 20 MG capsule Take 20 mg by mouth daily as needed (for heartburn- 30 minutes before a meal).     sacubitril -valsartan  (ENTRESTO ) 24-26 MG Take 1 tablet by mouth 2 (two) times daily. 180 tablet 3   sodium bicarbonate  650 MG tablet  Take 1 tablet (650 mg total) by mouth 2 (two) times daily for 10 days. 20 tablet 0   Syringe, Disposable, (2-3CC SYRINGE) 3 ML MISC 1 mg by Does not apply route every 14 (fourteen) days. 25 each 3   terbinafine  (LAMISIL ) 250 MG tablet Take 1 tablet (250 mg total) by mouth daily. 30 tablet 0   Testosterone  1.62 % GEL APPLY 2 PUMPS DAILY 75 g 3   testosterone  cypionate (DEPOTESTOSTERONE CYPIONATE) 200 MG/ML injection INJECT 1 ML (200 MG TOTAL) INTO THE MUSCLE EVERY 28 DAYS 10 mL 0   No current facility-administered medications for this visit.    Family history    Family History  Problem Relation Age of Onset   Heart attack Brother    Stroke Brother    Aortic stenosis Mother    Arthritis Father     Social history    Social History   Socioeconomic History   Marital status: Married    Spouse name: Not on file   Number of children: 3   Years of education: MD    Highest education level: Master's degree (e.g., MA, MS, MEng, MEd, MSW, MBA)  Occupational History   Occupation: Retired   Tobacco Use   Smoking status: Never   Smokeless tobacco: Never  Vaping Use   Vaping status: Never Used  Substance and Sexual Activity   Alcohol use: Yes    Alcohol/week: 1.0 standard drink of alcohol    Types: 1 Glasses of wine per week   Drug use: No   Sexual activity: Yes  Other Topics Concern   Not on  file  Social History Narrative   Independent at baseline. Lives with family   Drinks tea in the morning    Social Drivers of Health   Financial Resource Strain: Low Risk  (09/23/2023)   Received from [facility]   Overall Financial Resource Strain (CARDIA)    Difficulty of Paying Living Expenses: Not hard at all  Food Insecurity: No Food Insecurity (10/29/2023)   Hunger Vital Sign    Worried About Running Out of Food in the Last Year: Never true    Ran Out of Food in the Last Year: Never true  Transportation Needs: No Transportation Needs (10/29/2023)   PRAPARE -  Administrator, Civil Service (Medical): No    Lack of Transportation (Non-Medical): No  Physical Activity: Sufficiently Active (09/26/2021)   Exercise Vital Sign    Days of Exercise per Week: 5 days    Minutes of Exercise per Session: 50 min  Stress: No Stress Concern Present (09/26/2021)   [employer] of Occupational Health - Occupational Stress Questionnaire    Feeling of Stress : Not at all  Social Connections: Socially Integrated (10/29/2023)   Social Connection and Isolation Panel    Frequency of Communication with Friends and Family: Twice a week    Frequency of Social Gatherings with Friends and Family: Twice a week    Attends Religious Services: 1 to 4 times per year    Active Member of [employer] or Organizations: Yes    Attends Engineer, structural: More than 4 times per year    Marital Status: Married  Catering manager Violence: Not At Risk (10/29/2023)   Humiliation, Afraid, Rape, and Kick questionnaire    Fear of Current or Ex-Partner: No    Emotionally Abused: No    Physically Abused: No    Sexually Abused: No    Review of systems    +++ Exertional chest pain and dyspnea.  +++ Occasional cough.  He denies palpitations, pnd, orthopnea, n, v, dizziness, syncope, edema, weight gain, or early satiety.   Physical Exam    VS:  BP 114/66   Pulse 63   Ht 5' 5 (1.651 m)   Wt 150 lb 9.6 oz (68.3 kg)   SpO2 97%   BMI 25.06 kg/m  , BMI Body mass index is 25.06 kg/m.          GEN: Well nourished, well developed, in no acute distress. HEENT: normal. Neck: Supple, no JVD, carotid bruits, or masses. Cardiac: RRR, distant, no murmurs, rubs, or gallops. No clubbing, cyanosis, edema.  Radials 2+/PT 2+ and equal bilaterally.  Respiratory:  Respirations regular and unlabored, clear to auscultation bilaterally. GI: Soft, nontender, nondistended, BS + x 4. MS: no deformity or atrophy. Skin: warm and dry, no rash. Neuro:  Strength and sensation are  intact. Psych: Normal affect.  Accessory Clinical Findings    ECG personally reviewed by me today - EKG Interpretation Date/Time:  Wednesday November 05 2023 14:13:31 EDT Ventricular Rate:  63 PR Interval:  198 QRS Duration:  126 QT Interval:  462 QTC Calculation: 472 R Axis:   -20  Text Interpretation: AV dual-paced rhythm Confirmed by Vivienne Bruckner [id number]) on 11/05/2023 2:23:04 PM  - no acute changes.  Lab Results  Component Value Date   WBC 7.0 10/28/2023   HGB 16.0 10/28/2023   HCT 47.0 10/28/2023   MCV 88.4 10/28/2023   PLT 183 10/28/2023   Lab Results  Component Value Date   CREATININE 1.69 (  H) 10/30/2023   BUN 31 (H) 10/30/2023   NA 139 10/30/2023   K 4.0 10/30/2023   CL 99 10/30/2023   CO2 21 (L) 10/30/2023   Lab Results  Component Value Date   ALT 17 10/28/2023   AST 25 10/28/2023   ALKPHOS 78 10/28/2023   BILITOT 0.4 10/28/2023   Lab Results  Component Value Date   CHOL 148 10/29/2023   HDL 45 10/29/2023   LDLCALC 91 10/29/2023   TRIG 59 10/29/2023   CHOLHDL 3.3 10/29/2023    Lab Results  Component Value Date   HGBA1C 6.2 (H) 10/28/2023   Lab Results  Component Value Date   TSH 2.420 01/09/2022       Assessment & Plan    1.  Unstable angina/coronary artery disease: Patient with a history of moderate, nonobstructive CAD with catheterization in July 2024 showing stable anatomy with 65% proximal mid left circumflex disease and 70% mid RCA disease.  After medically managed over the past year but over the past 2 months, he has been experiencing exertional chest tightness and dyspnea that resolves with rest.  We added long-acting nitrate therapy last month however, this did not significantly change symptoms and due to relatively blood pressure, there is not much room to titrate nitrate further.  Recent echo showed improvement in EF to 55-60%.  In light of ongoing symptoms we discussed options for management today.  His wife was present for the discussion  as well.  We mutually agreed to proceed with diagnostic catheterization with appropriate holding of Eliquis  for 2 days prior to the procedure, pretreatment with prednisone  and Benadryl  for contrast allergy prophylaxis, and IV fluid hydration in the setting of known CKD.  I will continue statin and long-acting nitrate therapy. Informed Consent   Shared Decision Making/Informed Consent The risks [stroke (1 in 1000), death (1 in 1000), kidney failure [usually temporary] (1 in 500), bleeding (1 in 200), allergic reaction [possibly serious] (1 in 200)], benefits (diagnostic support and management of coronary artery disease) and alternatives of a cardiac catheterization were discussed in detail with Mr. Hjort and he is willing to proceed.     2.  Mixed ischemic/nonischemic cardiomyopathy/heart failure with improved ejection fraction: Patient with prior drop in EF to 30-35% in June of 2024 in the setting of persistent RV pacing.  He subsequently underwent CRT-P with improvement in EF to 40 to 45% by echo in March 2025 and more recently, 55 to 60% by echo in June 2025.  Patient is euvolemic on examination.  He does have some dyspnea in the setting of unstable angina as outlined above.  He remains on beta-blocker and Entresto .  3.  Primary HTN:  stable on ? blocker and entresto .  4.  HL: Typically followed at the [facility].  LDL of 91 in June.  He is on atorvastatin  80 mg daily.  I note that LDL was previously much lower in September 2023, when he was at 62.  Follow-up catheterization but will likely need addition of Zetia and/or PCSK9 inhibitor in the future.  5.  Paroxysmal atrial flutter/fibrillation: AV paced today.  He remains on Tikosyn  and is anticoagulated Eliquis  2.5 mg twice daily.  He will hold Eliquis  for 2 days prior to pending catheterization.  6.  Complete heart block: Status post permanent pacemaker with CRT-P upgrade in 2024.  AV paced today.  7.  Stage III-IV chronic kidney disease: On Entresto .   Recent creatinine was 1.69 on June 26.  Plan for hydration  pre and post catheterization next week.  8.  Valvular heart disease/mitral regurgitation/aortic insufficiency: Patient with moderate MR and AI on echocardiogram in March 2025.  June 2025 echo showed trivial MR and mild to moderate AI.  9.  Weakness: Patient recently admitted due to fatigue and mild left-sided weakness/clumsiness.  No acute findings on MRI.  He was treated with IV fluids with improvement in symptoms.  No recurrent symptoms.  10.  Disposition: Diagnostic catheterization next week with office follow-up approximately 2 weeks later.  Lonni Meager, NP 11/05/2023, 2:57 PM

## 2023-11-06 ENCOUNTER — Ambulatory Visit

## 2023-11-06 ENCOUNTER — Telehealth: Payer: Self-pay | Admitting: *Deleted

## 2023-11-06 NOTE — Telephone Encounter (Signed)
 Call placed to the patient and his wife. They have been advised that the patient will need to arrive by 6:30 for the cath on 11/10/23 due to needing hydration.

## 2023-11-10 ENCOUNTER — Ambulatory Visit

## 2023-11-10 ENCOUNTER — Encounter
Admission: RE | Disposition: A | Discharge status: Home / Self Care | Payer: Self-pay | Source: Home / Self Care | Attending: Cardiovascular Disease

## 2023-11-10 ENCOUNTER — Other Ambulatory Visit: Payer: Self-pay

## 2023-11-10 ENCOUNTER — Encounter: Payer: Self-pay | Admitting: Cardiovascular Disease

## 2023-11-10 ENCOUNTER — Ambulatory Visit
Admission: RE | Admit: 2023-11-10 | Discharge: 2023-11-11 | Disposition: A | Discharge status: Home / Self Care | Attending: Cardiovascular Disease | Admitting: Cardiovascular Disease

## 2023-11-10 DIAGNOSIS — M6281 Muscle weakness (generalized): Secondary | ICD-10-CM | POA: Insufficient documentation

## 2023-11-10 DIAGNOSIS — I25110 Atherosclerotic heart disease of native coronary artery with unstable angina pectoris: Secondary | ICD-10-CM | POA: Insufficient documentation

## 2023-11-10 DIAGNOSIS — I209 Angina pectoris, unspecified: Secondary | ICD-10-CM | POA: Diagnosis present

## 2023-11-10 DIAGNOSIS — I679 Cerebrovascular disease, unspecified: Secondary | ICD-10-CM

## 2023-11-10 DIAGNOSIS — R200 Anesthesia of skin: Secondary | ICD-10-CM

## 2023-11-10 DIAGNOSIS — I200 Unstable angina: Secondary | ICD-10-CM | POA: Diagnosis present

## 2023-11-10 DIAGNOSIS — I480 Paroxysmal atrial fibrillation: Secondary | ICD-10-CM | POA: Diagnosis not present

## 2023-11-10 DIAGNOSIS — I639 Cerebral infarction, unspecified: Secondary | ICD-10-CM

## 2023-11-10 DIAGNOSIS — R262 Difficulty in walking, not elsewhere classified: Secondary | ICD-10-CM | POA: Diagnosis not present

## 2023-11-10 DIAGNOSIS — I130 Hypertensive heart and chronic kidney disease with heart failure and stage 1 through stage 4 chronic kidney disease, or unspecified chronic kidney disease: Secondary | ICD-10-CM | POA: Insufficient documentation

## 2023-11-10 DIAGNOSIS — Z79899 Other long term (current) drug therapy: Secondary | ICD-10-CM | POA: Insufficient documentation

## 2023-11-10 DIAGNOSIS — Z85528 Personal history of other malignant neoplasm of kidney: Secondary | ICD-10-CM | POA: Diagnosis not present

## 2023-11-10 DIAGNOSIS — I5022 Chronic systolic (congestive) heart failure: Secondary | ICD-10-CM | POA: Insufficient documentation

## 2023-11-10 DIAGNOSIS — I080 Rheumatic disorders of both mitral and aortic valves: Secondary | ICD-10-CM | POA: Insufficient documentation

## 2023-11-10 DIAGNOSIS — I442 Atrioventricular block, complete: Secondary | ICD-10-CM | POA: Diagnosis not present

## 2023-11-10 DIAGNOSIS — E1122 Type 2 diabetes mellitus with diabetic chronic kidney disease: Secondary | ICD-10-CM | POA: Diagnosis not present

## 2023-11-10 DIAGNOSIS — G4733 Obstructive sleep apnea (adult) (pediatric): Secondary | ICD-10-CM | POA: Diagnosis not present

## 2023-11-10 DIAGNOSIS — I25119 Atherosclerotic heart disease of native coronary artery with unspecified angina pectoris: Secondary | ICD-10-CM

## 2023-11-10 DIAGNOSIS — G8192 Hemiplegia, unspecified affecting left dominant side: Secondary | ICD-10-CM

## 2023-11-10 DIAGNOSIS — R29818 Other symptoms and signs involving the nervous system: Secondary | ICD-10-CM | POA: Diagnosis not present

## 2023-11-10 DIAGNOSIS — G459 Transient cerebral ischemic attack, unspecified: Secondary | ICD-10-CM | POA: Diagnosis not present

## 2023-11-10 DIAGNOSIS — E785 Hyperlipidemia, unspecified: Secondary | ICD-10-CM | POA: Diagnosis present

## 2023-11-10 DIAGNOSIS — Z7902 Long term (current) use of antithrombotics/antiplatelets: Secondary | ICD-10-CM | POA: Insufficient documentation

## 2023-11-10 DIAGNOSIS — R2990 Unspecified symptoms and signs involving the nervous system: Secondary | ICD-10-CM

## 2023-11-10 DIAGNOSIS — I428 Other cardiomyopathies: Secondary | ICD-10-CM | POA: Insufficient documentation

## 2023-11-10 DIAGNOSIS — I2510 Atherosclerotic heart disease of native coronary artery without angina pectoris: Secondary | ICD-10-CM | POA: Diagnosis present

## 2023-11-10 DIAGNOSIS — R531 Weakness: Secondary | ICD-10-CM

## 2023-11-10 DIAGNOSIS — Z7901 Long term (current) use of anticoagulants: Secondary | ICD-10-CM | POA: Diagnosis not present

## 2023-11-10 DIAGNOSIS — N1832 Chronic kidney disease, stage 3b: Secondary | ICD-10-CM | POA: Diagnosis not present

## 2023-11-10 DIAGNOSIS — I2584 Coronary atherosclerosis due to calcified coronary lesion: Secondary | ICD-10-CM | POA: Diagnosis not present

## 2023-11-10 DIAGNOSIS — I6782 Cerebral ischemia: Secondary | ICD-10-CM | POA: Insufficient documentation

## 2023-11-10 DIAGNOSIS — Z950 Presence of cardiac pacemaker: Secondary | ICD-10-CM | POA: Diagnosis present

## 2023-11-10 DIAGNOSIS — I10 Essential (primary) hypertension: Secondary | ICD-10-CM | POA: Diagnosis present

## 2023-11-10 DIAGNOSIS — Z8673 Personal history of transient ischemic attack (TIA), and cerebral infarction without residual deficits: Secondary | ICD-10-CM

## 2023-11-10 HISTORY — PX: CORONARY STENT INTERVENTION: CATH118234

## 2023-11-10 HISTORY — PX: LEFT HEART CATH AND CORONARY ANGIOGRAPHY: CATH118249

## 2023-11-10 LAB — GLUCOSE, CAPILLARY: Glucose-Capillary: 148 mg/dL — ABNORMAL HIGH (ref 70–99)

## 2023-11-10 SURGERY — LEFT HEART CATH AND CORONARY ANGIOGRAPHY
Anesthesia: Moderate Sedation

## 2023-11-10 MED ORDER — SODIUM CHLORIDE 0.9 % WEIGHT BASED INFUSION: 1.0000 mL/kg/h | INTRAVENOUS | Status: AC

## 2023-11-10 MED ORDER — ACETAMINOPHEN 500 MG PO TABS: 500.0000 mg | ORAL_TABLET | Freq: Four times a day (QID) | ORAL | Status: DC | PRN

## 2023-11-10 MED ORDER — VERAPAMIL HCL 2.5 MG/ML IV SOLN
INTRAVENOUS | Status: DC | PRN
Start: 2023-11-10 — End: 2023-11-10
  Administered 2023-11-10: 2.5 mg via INTRA_ARTERIAL

## 2023-11-10 MED ORDER — DOCUSATE SODIUM 100 MG PO CAPS
100.0000 mg | ORAL_CAPSULE | Freq: Every day | ORAL | Status: DC | PRN
Start: 2023-11-10 — End: 2023-11-11

## 2023-11-10 MED ORDER — DOFETILIDE 125 MCG PO CAPS
125.0000 ug | ORAL_CAPSULE | Freq: Two times a day (BID) | ORAL | Status: DC
  Administered 2023-11-11: 125 ug via ORAL
  Filled 2023-11-10 (×3): qty 1

## 2023-11-10 MED ORDER — COQ10 100 MG PO CAPS: 100.0000 mg | ORAL_CAPSULE | Freq: Every day | ORAL | Status: DC

## 2023-11-10 MED ORDER — SODIUM CHLORIDE 0.9% FLUSH
3.0000 mL | Freq: Two times a day (BID) | INTRAVENOUS | Status: DC
  Administered 2023-11-10 – 2023-11-11 (×2): 3 mL via INTRAVENOUS

## 2023-11-10 MED ORDER — SODIUM CHLORIDE 0.9 % IV SOLN: 250.0000 mL | INTRAVENOUS | Status: DC | PRN

## 2023-11-10 MED ORDER — SODIUM CHLORIDE 0.9 % WEIGHT BASED INFUSION
3.0000 mL/kg/h | INTRAVENOUS | Status: DC
  Administered 2023-11-10: 3 mL/kg/h via INTRAVENOUS

## 2023-11-10 MED ORDER — POLYVINYL ALCOHOL 1.4 % OP SOLN
1.0000 [drp] | Freq: Three times a day (TID) | OPHTHALMIC | Status: DC
  Administered 2023-11-11 (×3): 1 [drp] via OPHTHALMIC
  Filled 2023-11-10: qty 15

## 2023-11-10 MED ORDER — CLOPIDOGREL BISULFATE 75 MG PO TABS
ORAL_TABLET | ORAL | Status: DC | PRN
  Administered 2023-11-10: 600 mg via ORAL

## 2023-11-10 MED ORDER — ASPIRIN 81 MG PO CHEW: 81.0000 mg | CHEWABLE_TABLET | ORAL | Status: DC

## 2023-11-10 MED ORDER — HEPARIN (PORCINE) IN NACL 1000-0.9 UT/500ML-% IV SOLN
INTRAVENOUS | Status: DC | PRN
  Administered 2023-11-10: 1000 mL

## 2023-11-10 MED ORDER — ONDANSETRON HCL 4 MG/2ML IJ SOLN: 4.0000 mg | Freq: Four times a day (QID) | INTRAMUSCULAR | Status: DC | PRN

## 2023-11-10 MED ORDER — BUPROPION HCL ER (XL) 150 MG PO TB24
150.0000 mg | ORAL_TABLET | Freq: Every day | ORAL | Status: DC
  Administered 2023-11-11: 150 mg via ORAL
  Filled 2023-11-10: qty 1

## 2023-11-10 MED ORDER — CLOPIDOGREL BISULFATE 75 MG PO TABS
75.0000 mg | ORAL_TABLET | Freq: Every day | ORAL | Status: DC
  Administered 2023-11-11: 75 mg via ORAL
  Filled 2023-11-10: qty 1

## 2023-11-10 MED ORDER — ATORVASTATIN CALCIUM 80 MG PO TABS
80.0000 mg | ORAL_TABLET | Freq: Every day | ORAL | Status: DC
  Administered 2023-11-11: 80 mg via ORAL
  Filled 2023-11-10: qty 1

## 2023-11-10 MED ORDER — HEPARIN SODIUM (PORCINE) 1000 UNIT/ML IJ SOLN
INTRAMUSCULAR | Status: DC | PRN
  Administered 2023-11-10: 5000 [IU] via INTRAVENOUS
  Administered 2023-11-10: 3000 [IU] via INTRAVENOUS

## 2023-11-10 MED ORDER — FENTANYL CITRATE (PF) 100 MCG/2ML IJ SOLN
INTRAMUSCULAR | Status: DC | PRN
  Administered 2023-11-10: 25 ug via INTRAVENOUS

## 2023-11-10 MED ORDER — NITROGLYCERIN 0.4 MG SL SUBL: 0.4000 mg | SUBLINGUAL_TABLET | SUBLINGUAL | Status: DC | PRN

## 2023-11-10 MED ORDER — LIDOCAINE HCL (PF) 1 % IJ SOLN
INTRAMUSCULAR | Status: DC | PRN
  Administered 2023-11-10: 2 mL

## 2023-11-10 MED ORDER — ASPIRIN 81 MG PO CHEW
CHEWABLE_TABLET | ORAL | Status: DC | PRN
  Administered 2023-11-10: 162 mg via ORAL

## 2023-11-10 MED ORDER — DIPHENHYDRAMINE HCL 25 MG PO CAPS
ORAL_CAPSULE | ORAL | Status: AC
  Filled 2023-11-10: qty 2

## 2023-11-10 MED ORDER — SACUBITRIL-VALSARTAN 24-26 MG PO TABS
1.0000 | ORAL_TABLET | Freq: Two times a day (BID) | ORAL | Status: DC
  Filled 2023-11-10: qty 1

## 2023-11-10 MED ORDER — SODIUM CHLORIDE 0.9% FLUSH: 3.0000 mL | INTRAVENOUS | Status: DC | PRN

## 2023-11-10 MED ORDER — ISOSORBIDE MONONITRATE ER 30 MG PO TB24
15.0000 mg | ORAL_TABLET | Freq: Every day | ORAL | Status: DC
  Administered 2023-11-11: 15 mg via ORAL
  Filled 2023-11-10: qty 1

## 2023-11-10 MED ORDER — CALCIUM CARBONATE ANTACID 500 MG PO CHEW: 1.0000 | CHEWABLE_TABLET | Freq: Every day | ORAL | Status: DC | PRN

## 2023-11-10 MED ORDER — SODIUM CHLORIDE 0.9 % IV SOLN
INTRAVENOUS | Status: DC | PRN
  Administered 2023-11-10: 68 mL/h via INTRAVENOUS

## 2023-11-10 MED ORDER — STROKE: EARLY STAGES OF RECOVERY BOOK: Freq: Once | Status: AC

## 2023-11-10 MED ORDER — MIDAZOLAM HCL 2 MG/2ML IJ SOLN
INTRAMUSCULAR | Status: DC | PRN
  Administered 2023-11-10: 1 mg via INTRAVENOUS

## 2023-11-10 MED ORDER — DULOXETINE HCL 20 MG PO CPEP
20.0000 mg | ORAL_CAPSULE | Freq: Two times a day (BID) | ORAL | Status: DC
  Administered 2023-11-11 (×2): 20 mg via ORAL
  Filled 2023-11-10 (×3): qty 1

## 2023-11-10 MED ORDER — SODIUM CHLORIDE 0.9 % IV SOLN: INTRAVENOUS | Status: AC

## 2023-11-10 MED ORDER — FEBUXOSTAT 40 MG PO TABS
40.0000 mg | ORAL_TABLET | Freq: Every day | ORAL | Status: DC
  Administered 2023-11-11: 40 mg via ORAL
  Filled 2023-11-10: qty 1

## 2023-11-10 MED ORDER — DIPHENHYDRAMINE HCL 25 MG PO CAPS
50.0000 mg | ORAL_CAPSULE | Freq: Once | ORAL | Status: AC
  Administered 2023-11-10: 50 mg via ORAL

## 2023-11-10 MED ORDER — ASPIRIN 81 MG PO CHEW
81.0000 mg | CHEWABLE_TABLET | Freq: Every day | ORAL | Status: DC
  Administered 2023-11-11: 81 mg via ORAL
  Filled 2023-11-10: qty 1

## 2023-11-10 MED ORDER — IOHEXOL 300 MG/ML  SOLN
INTRAMUSCULAR | Status: DC | PRN
  Administered 2023-11-10: 84 mL

## 2023-11-10 MED ORDER — SODIUM CHLORIDE 0.9 % WEIGHT BASED INFUSION
1.0000 mL/kg/h | INTRAVENOUS | Status: DC
  Administered 2023-11-10: 1 mL/kg/h via INTRAVENOUS

## 2023-11-10 MED ORDER — PANTOPRAZOLE SODIUM 40 MG PO TBEC
40.0000 mg | DELAYED_RELEASE_TABLET | Freq: Every day | ORAL | Status: DC
  Administered 2023-11-11: 40 mg via ORAL
  Filled 2023-11-10: qty 1

## 2023-11-10 MED ORDER — MECLIZINE HCL 25 MG PO TABS
12.5000 mg | ORAL_TABLET | Freq: Three times a day (TID) | ORAL | Status: DC
  Administered 2023-11-11: 12.5 mg via ORAL
  Filled 2023-11-10 (×3): qty 0.5

## 2023-11-10 MED ORDER — METOPROLOL SUCCINATE ER 50 MG PO TB24
50.0000 mg | ORAL_TABLET | Freq: Every day | ORAL | Status: DC
  Filled 2023-11-10: qty 1

## 2023-11-10 SURGICAL SUPPLY — 15 items
BALLOON TREK RX 2.75X12 (BALLOONS) IMPLANT
CATH INFINITI 5 FR JL3.5 (CATHETERS) IMPLANT
CATH INFINITI JR4 5F (CATHETERS) IMPLANT
CATH LAUNCHER 6FR EBU3.5 (CATHETERS) IMPLANT
DEVICE RAD TR BAND REGULAR (VASCULAR PRODUCTS) IMPLANT
DRAPE BRACHIAL (DRAPES) IMPLANT
GLIDESHEATH SLEND SS 6F .021 (SHEATH) IMPLANT
GUIDEWIRE INQWIRE 1.5J.035X260 (WIRE) IMPLANT
KIT ENCORE 26 ADVANTAGE (KITS) IMPLANT
PACK CARDIAC CATH (CUSTOM PROCEDURE TRAY) ×2 IMPLANT
SET ATX-X65L (MISCELLANEOUS) IMPLANT
STATION PROTECTION PRESSURIZED (MISCELLANEOUS) IMPLANT
STENT ONYX FRONTIER 3.5X15 (Permanent Stent) IMPLANT
TUBING CIL FLEX 10 FLL-RA (TUBING) IMPLANT
WIRE RUNTHROUGH .014X180CM (WIRE) IMPLANT

## 2023-11-10 NOTE — Consult Note (Signed)
 Patient Demographics  Thomas [name], is a 89 y.o. male   MRN: [id number]   DOB - [birth year]  Admit Date - 11/10/2023    Outpatient Primary MD for the patient is [name], MD  Consult requested in the Hospital by Darron Deatrice LABOR, MD, On 11/11/2023    Reason for consult : Responded to code stroke   With History of -  Past Medical History:  Diagnosis Date   Aortic insufficiency    a. 07/2023 Echo: Mod AI; b. 09/2023 Echo: Mild-mod AI.   Aortic valve disorders    Arthritis    Basal cell carcinoma    face, nose left shoulder, left arm (06/19/2016)   Basal cell carcinoma 09/13/2020   right temple   BBB (bundle branch block)    hx right   CAD (coronary artery disease)    a. 10/2017 Cath: mod, nonobs dzs; b. 02/2021 Cath: mod, nonobs dzs; c. 11/2022 Cath: LAD 30ost/p, 86m, D1 min irregs, LCX 65p/m, OM1 min irregs, OM2 40, OM3 nl, RCA 30p, 17m, 40d w/ 40 in side branch-->Med rx.   Chronic back pain    neck, thoracic, lower back (06/19/2016)   CKD (chronic kidney disease), stage III (HCC)    Complete heart block (HCC) 06/2016   a. 06/2016 s/p MDT PPM; b. 01/2023 s/p upgrade to MDT CRT-P (ser # [id number]).   GERD (gastroesophageal reflux disease)    Gout    Heart block    I've had type I, II Wenke before now (06/19/2016)   History of gout    History of hiatal hernia    self dx'd (06/19/2016)   Hyperlipidemia    Hypertension    Lyme disease    dx'd by me 2003; cx's showed dx 08/2015   Migraine    3-4/year (06/19/2016)   Mitral regurgitation    a. 07/2023 Echo: Mod MR.   NICM (nonischemic cardiomyopathy - in setting of RV pacing) (HCC)    a. 02/2019 Echo: EF 45-50%; b. 10/2022 Echo: EF 30-35%; c. 01/2023 CRT-P upgrade; e. 07/2023 Echo: EF 40-45%, glob HK; f. 10/2023 Echo: EF of 55 to 60% with grade 1 DD, nl RV fxn, triv MR, mild to mod AI, and mildly dilated ascending aorta at 42 mm.    PAF (paroxysmal atrial fibrillation) (HCC)    a. CHA2DS2VASc = 7--> eliquis ; b. On tikosyn .   Paroxysmal atrial flutter (HCC)    Presence of permanent cardiac pacemaker    PVC's (premature ventricular contractions)    Renal cancer, left (HCC) 2006   S/P cryotherapy   Spinal stenosis    cervical, 1 thoracic, lumbar (06/19/2016)   Squamous carcinoma    face, nose left shoulder, left arm (06/19/2016)   Stroke [facility])    TIA (transient ischemic attack) 06/14/2016   I'm not sure that's what it was (06/25/2016)   Visit for monitoring Tikosyn  therapy 09/09/2017      Past Surgical History:  Procedure Laterality Date   ANKLE FRACTURE SURGERY Right 1967  BACK SURGERY  05/07/2020   BASAL CELL CARCINOMA EXCISION     face, nose left shoulder, left arm   BIOPSY PROSTATE  2001 & 2003   BIV UPGRADE N/A 01/10/2023   Procedure: BIV UPGRADE;  Surgeon: Fernande Elspeth BROCKS, MD;  Location: [facility] INVASIVE CV LAB;  Service: Cardiovascular;  Laterality: N/A;   CARDIAC CATHETERIZATION  1990's   CARDIOVERSION N/A 09/11/2017   Procedure: CARDIOVERSION;  Surgeon: Pietro Redell RAMAN, MD;  Location: [facility] ENDOSCOPY;  Service: Cardiovascular;  Laterality: N/A;   FRACTURE SURGERY     HOLEP-LASER ENUCLEATION OF THE PROSTATE WITH MORCELLATION N/A 07/10/2020   Procedure: HOLEP-LASER ENUCLEATION OF THE PROSTATE WITH MORCELLATION;  Surgeon: Penne Knee, MD;  Location: [facility] ORS;  Service: Urology;  Laterality: N/A;   INGUINAL HERNIA REPAIR Left 2012   INSERT / REPLACE / REMOVE PACEMAKER  06/19/2016   LAPAROSCOPIC ABLATION RENAL MASS     LEFT HEART CATH AND CORONARY ANGIOGRAPHY Left 10/23/2017   Procedure: LEFT HEART CATH AND CORONARY ANGIOGRAPHY;  Surgeon: Darron Deatrice LABOR, MD;  Location: [facility] INVASIVE CV LAB;  Service: Cardiovascular;  Laterality: Left;   LEFT HEART CATH AND CORONARY ANGIOGRAPHY Left 02/26/2021   Procedure: LEFT HEART CATH AND CORONARY ANGIOGRAPHY;  Surgeon: Darron Deatrice LABOR, MD;  Location: [facility] INVASIVE CV  LAB;  Service: Cardiovascular;  Laterality: Left;   PACEMAKER IMPLANT N/A 06/19/2016   Procedure: Pacemaker Implant;  Surgeon: Elspeth BROCKS Fernande, MD;  Location: [facility] INVASIVE CV LAB;  Service: Cardiovascular;  Laterality: N/A;   pacemasker     PROSTATE SURGERY     RIGHT/LEFT HEART CATH AND CORONARY ANGIOGRAPHY Bilateral 11/25/2022   Procedure: RIGHT/LEFT HEART CATH AND CORONARY ANGIOGRAPHY;  Surgeon: Darron Deatrice LABOR, MD;  Location: [facility] INVASIVE CV LAB;  Service: Cardiovascular;  Laterality: Bilateral;   SQUAMOUS CELL CARCINOMA EXCISION     face, nose left shoulder, left arm   TONSILLECTOMY AND ADENOIDECTOMY      in for   No chief complaint on file.    HPI  Thomas [name]  is a 89 y.o. male, with past medical history of hypertension, hyperlipidemia, diabetes mellitus, A-fib, CAD, patient was admitted for unstable angina, he went for cardiac cath this morning, significant for 90% LAD disease status post stent placement, he is already on aspirin , Plavix  for this, his Eliquis  has been on hold since Friday in anticipation of procedure today, code stroke was called this evening as patient was noted to have new left upper extremity weakness, at baseline he is with known left upper extremity tingling and numbness, patient with known history of significant intracranial vessel disease, with recent presentation last week with MRI negative for any acute CVA, but known MCA and M1 stenosis, code stroke was called, patient went for stat code stroke CT head with no acute findings, upon evaluation by teleneurology it was felt his presentation likely due to hypoperfusion from low blood pressure.    Review of Systems     A full 10 point Review of Systems was done, except as stated above, all other Review of Systems were negative.   Social History Social History   Tobacco Use   Smoking status: Never   Smokeless tobacco: Never  Substance Use Topics   Alcohol  use: Yes    Alcohol /week: 2.0 standard drinks  of alcohol     Types: 2 Glasses of wine per week    Family History Family History  Problem Relation Age of Onset   Heart attack Brother    Stroke Brother  Aortic stenosis Mother    Arthritis Father      Prior to Admission medications   Medication Sig Start Date End Date Taking? Authorizing Provider  acetaminophen  (TYLENOL ) 500 MG tablet Take 500-1,000 mg by mouth every 6 (six) hours as needed for mild pain (pain score 1-3) or fever.   Yes [provider]  Artificial Tears ophthalmic solution Place 1 drop into both eyes in the morning, at noon, in the evening, and at bedtime.   Yes [provider]  atorvastatin  (LIPITOR ) 80 MG tablet Take 1 tablet (80 mg total) by mouth daily. 10/31/23  Yes Regalado, Belkys A, MD  buPROPion  (WELLBUTRIN  XL) 150 MG 24 hr tablet Take 150 mg by mouth daily. 10/29/22  Yes [provider]  calcium  carbonate (TUMS - DOSED IN MG ELEMENTAL CALCIUM ) 500 MG chewable tablet Chew 1-2 tablets by mouth daily as needed for indigestion.   Yes [provider]  Coenzyme Q10 (COQ10) 100 MG CAPS Take 100 mg by mouth daily.   Yes [provider]  diphenhydrAMINE  (BENADRYL ) 50 MG tablet Take 1 tablet (50 mg total) by mouth once for 1 dose. 1 hour prior to test 11/05/23 11/10/23 Yes [name], NP  docusate sodium  (COLACE) 100 MG capsule Take 100 mg by mouth daily as needed for mild constipation.   Yes [provider]  dofetilide  (TIKOSYN ) 125 MCG capsule TAKE 1 CAPSULE BY MOUTH TWICE DAILY 07/22/23  Yes Fernande Elspeth BROCKS, MD  doxycycline  (VIBRA -TABS) 100 MG tablet Take 100 mg by mouth daily. 10/10/23  Yes [provider]  DULoxetine  (CYMBALTA ) 20 MG capsule Take 20 mg by mouth 2 (two) times daily.   Yes [provider]  febuxostat  (ULORIC ) 40 MG tablet Take 1 tablet (40 mg total) by mouth daily. 08/21/21  Yes [name], MD  isosorbide  mononitrate (IMDUR ) 30 MG 24 hr tablet Take 0.5 tablets (15  mg total) by mouth daily. 10/03/23 01/01/24 Yes [name], NP  ketoconazole  (NIZORAL ) 2 % cream Apply to the feet every night to prevent fungal infection. 10/29/23  Yes Hester Alm BROCKS, MD  meclizine  (ANTIVERT ) 12.5 MG tablet Take 12.5 mg by mouth 3 (three) times daily. 10/01/23  Yes [provider]  metoprolol  succinate (TOPROL -XL) 50 MG 24 hr tablet TAKE 1 TABLET BY MOUTH DAILY WITH OR IMMEDIATELY FOLLOWING A MEAL Patient taking differently: Take 50 mg by mouth at bedtime. 04/16/23  Yes Fernande Elspeth BROCKS, MD  metroNIDAZOLE  (FLAGYL ) 500 MG tablet TAKE 1 TABLET BY MOUTH TWICE DAILY ON SUNDAY AND MONDAY. 11/23/21  Yes [name], MD  omeprazole (PRILOSEC) 20 MG capsule Take 20 mg by mouth daily as needed (for heartburn- 30 minutes before a meal). 05/16/21  Yes [provider]  predniSONE  (DELTASONE ) 50 MG tablet Take 1 tablet by mouth 13 hrs prior to test, another tablet 7 hours prior, and last dose 1 hour prior. 11/05/23  Yes [name], NP  sacubitril -valsartan  (ENTRESTO ) 24-26 MG Take 1 tablet by mouth 2 (two) times daily. 04/16/23  Yes Fernande Elspeth BROCKS, MD  terbinafine  (LAMISIL ) 250 MG tablet Take 1 tablet (250 mg total) by mouth daily. 10/29/23  Yes Hester Alm BROCKS, MD  Testosterone  1.62 % GEL APPLY 2 PUMPS DAILY 10/03/23  Yes McGowan, Clotilda A, PA-C  apixaban  (ELIQUIS ) 2.5 MG TABS tablet Take 1 tablet (2.5 mg total) by mouth 2 (two) times daily. 12/05/22   Fernande Elspeth BROCKS, MD  NEEDLE, DISP, 18 G (BD DISP NEEDLES)  18G X 1-1/2 MISC 1 mg by Does not apply route every 14 (fourteen) days. 05/26/20   Helon Kirsch A, PA-C  NEEDLE, DISP, 21 G (BD DISP NEEDLES) 21G X 1-1/2 MISC 1 mg by Does not apply route every 14 (fourteen) days. 05/26/20   Helon Kirsch A, PA-C  nitroGLYCERIN  (NITROSTAT ) 0.4 MG SL tablet Place 1 tablet (0.4 mg total) under the tongue every 5 (five) minutes as needed for chest pain. 10/03/23 01/01/24  [name], NP   Syringe, Disposable, (2-3CC SYRINGE) 3 ML MISC 1 mg by Does not apply route every 14 (fourteen) days. 05/26/20   Helon Kirsch A, PA-C  testosterone  cypionate (DEPOTESTOSTERONE CYPIONATE) 200 MG/ML injection INJECT 1 ML (200 MG TOTAL) INTO THE MUSCLE EVERY 28 DAYS 10/08/23   McGowan, Kirsch A, PA-C    Anti-infectives (From admission, onward)    None       Scheduled Meds:   stroke: early stages of recovery book   Does not apply Once   artificial tears  1 drop Both Eyes TID   aspirin   81 mg Oral Daily   atorvastatin   80 mg Oral Daily   buPROPion   150 mg Oral Daily   clopidogrel   75 mg Oral Q breakfast   diphenhydrAMINE        dofetilide   125 mcg Oral BID   DULoxetine   20 mg Oral BID   febuxostat   40 mg Oral Daily   isosorbide  mononitrate  15 mg Oral Daily   meclizine   12.5 mg Oral TID   metoprolol  succinate  50 mg Oral QHS   pantoprazole   40 mg Oral Daily   sacubitril -valsartan   1 tablet Oral BID   sodium chloride  flush  3 mL Intravenous Q12H   Continuous Infusions:  sodium chloride      sodium chloride      PRN Meds:.sodium chloride , acetaminophen , calcium  carbonate, diphenhydrAMINE , docusate sodium , nitroGLYCERIN , ondansetron  (ZOFRAN ) IV, sodium chloride  flush  Allergies  Allergen Reactions   Other Other (See Comments)    Vicryl sutures - patient states that site becomes soupy   Baclofen     Severe delirium    Iodinated Contrast Media Hives   Iodine Hives and Other (See Comments)    IVP contrast   Penicillins Itching, Rash and Other (See Comments)    ITCHY FEELING IN FINGERS; Tolerated Zosyn  04/2023    Physical Exam  Vitals  Blood pressure 131/68, pulse 71, temperature (!) 97.5 F (36.4 C), temperature source Oral, resp. rate 18, height 5' 5 (1.651 m), weight 68 kg, SpO2 97%.   1. General Frail elderly male, no apparent distress  2. Normal affect and insight, Not Suicidal or Homicidal, Awake Alert, Oriented X 3.  3. No F.N deficits, ALL C.Nerves Intact,  significant left upper extremity drift, with dysmetria finger-to-nose test   4. Ears and Eyes appear Normal, Conjunctivae clear, PERRLA. Moist Oral Mucosa.  5. Supple Neck, No JVD, No cervical lymphadenopathy appriciated, No Carotid Bruits.  6. Symmetrical Chest wall movement, Good air movement bilaterally, CTAB.  7. RRR, No Gallops, Rubs or Murmurs, No Parasternal Heave.  8. Positive Bowel Sounds, Abdomen Soft, No tenderness, No organomegaly appriciated,No rebound -guarding or rigidity.  9.  No Cyanosis, Normal Skin Turgor, No Skin Rash or Bruise.  10. Good muscle tone,  joints appear normal , no effusions, Normal ROM.  Data Review  CBC No results for input(s): WBC, HGB, HCT, PLT, MCV, MCH, MCHC, RDW, LYMPHSABS, MONOABS, EOSABS, BASOSABS, BANDABS in the last 168 hours.  Invalid  input(s): NEUTRABS, BANDSABD ------------------------------------------------------------------------------------------------------------------  Chemistries  No results for input(s): NA, K, CL, CO2, GLUCOSE, BUN, CREATININE, CALCIUM , MG, AST, ALT, ALKPHOS, BILITOT in the last 168 hours.  Invalid input(s): GFRCGP ------------------------------------------------------------------------------------------------------------------ estimated creatinine clearance is 25.8 mL/min (A) (by C-G formula based on SCr of 1.69 mg/dL (H)). ------------------------------------------------------------------------------------------------------------------ No results for input(s): TSH, T4TOTAL, T3FREE, THYROIDAB in the last 72 hours.  Invalid input(s): FREET3   Coagulation profile No results for input(s): INR, PROTIME in the last 168 hours. ------------------------------------------------------------------------------------------------------------------- No results for input(s): DDIMER in the last 72  hours. -------------------------------------------------------------------------------------------------------------------  Cardiac Enzymes No results for input(s): CKMB, TROPONINI, MYOGLOBIN in the last 168 hours.  Invalid input(s): CK ------------------------------------------------------------------------------------------------------------------ Invalid input(s): POCBNP   ---------------------------------------------------------------------------------------------------------------  Urinalysis    Component Value Date/Time   COLORURINE AMBER (A) 04/29/2023 1455   APPEARANCEUR Clear 05/14/2023 1101   LABSPEC 1.012 04/29/2023 1455   PHURINE 5.0 04/29/2023 1455   GLUCOSEU Negative 05/14/2023 1101   HGBUR MODERATE (A) 04/29/2023 1455   BILIRUBINUR Negative 05/14/2023 1101   KETONESUR NEGATIVE 04/29/2023 1455   PROTEINUR Negative 05/14/2023 1101   PROTEINUR 100 (A) 04/29/2023 1455   UROBILINOGEN 0.2 07/19/2020 1700   NITRITE Negative 05/14/2023 1101   NITRITE POSITIVE (A) 04/29/2023 1455   LEUKOCYTESUR Trace (A) 05/14/2023 1101   LEUKOCYTESUR LARGE (A) 04/29/2023 1455     Imaging results:   CT HEAD CODE STROKE WO CONTRAST` Result Date: 11/10/2023 CLINICAL DATA:  Code stroke. Neuro deficit, acute, stroke suspected Transient ischemic attack (TIA) left arm numb, on blood thinners post cath stent placement, hx of CVA EXAM: CT HEAD WITHOUT CONTRAST TECHNIQUE: Contiguous axial images were obtained from the base of the skull through the vertex without intravenous contrast. RADIATION DOSE REDUCTION: This exam was performed according to the departmental dose-optimization program which includes automated exposure control, adjustment of the mA and/or kV according to patient size and/or use of iterative reconstruction technique. COMPARISON:  MRI head October 30, 2023.  CT head October 28, 2023 FINDINGS: Brain: No evidence of acute infarction, hemorrhage, hydrocephalus, extra-axial  collection or mass lesion/mass effect. Vascular: No hyperdense vessel or unexpected calcification. Skull: No acute fracture. Sinuses/Orbits: Clear stenosis.  No acute orbital findings. ASPECTS [facility] Stroke Program Early CT Score) Total score (0-10 with 10 being normal): 10. IMPRESSION: No evidence of acute intracranial abnormality.  ASPECTS 10. Finding discussed with Dr. Glynis via telephone at 10:23 PM. Electronically Signed   By: Gilmore GORMAN Molt M.D.   On: 11/10/2023 22:23   CARDIAC CATHETERIZATION Result Date: 11/10/2023   Mid LAD lesion is 60% stenosed.   Prox Cx to Mid Cx lesion is 65% stenosed.   Dist RCA lesion is 40% stenosed with 40% stenosed side branch in RPAV.   Prox RCA lesion is 30% stenosed.   Mid RCA lesion is 70% stenosed.   Ost 2nd Mrg to 2nd Mrg lesion is 40% stenosed.   Ost LAD to Prox LAD lesion is 90% stenosed.   A drug-eluting stent was successfully placed using a STENT ONYX FRONTIER 3.5X15.   Post intervention, there is a 0% residual stenosis. 1.  Known borderline three-vessel coronary artery disease with progression of proximal LAD stenosis from 40% to 90%.  This is the only difference from last catheterization in 2024. 2.  Left ventricular angiography was not performed due to chronic kidney disease.  Normal left ventricular end-diastolic pressure. 3.  Successful angioplasty and drug-eluting stent placement to the proximal LAD. Recommendations: I suspect that the proximal LAD stenosis is the culprit  for the patient's recent anginal symptoms.  The rest of his coronary artery disease was present last year and he was asymptomatic at that time. Eliquis  can be resumed tomorrow if no bleeding issues.  Recommend stopping aspirin  after 1 week and continue clopidogrel  for a minimum of 6 months. Will hydrate the patient overnight given chronic kidney disease.  85 mL of contrast was used for the procedure.       Assessment & Plan  Principal Problem:   Angina pectoris (HCC) Active  Problems:   TIA (transient ischemic attack)    TIA versus CVA - Code stroke was called given patient developed left hand weakness, drift and dysmetria. - Similar presentation in the recent past, with negative stroke workup, was felt secondary to hypoperfusion from soft blood pressure in the setting of significant intracranial vessel disease . - Recent imaging significant for mild stenosis of the right ACA, and right MRI stenosis as well. - Avoid hypotension. - Per neurology recommendation allow for permissive hypertension, will hold both Entresto  and Toprol -XL this evening . - Hold Entresto  for now and reassess in a.m.SABRA - Will obtain MRI brain, MRA head, carotid Dopplers (will avoid MRA neck with contrast given AKI, will avoid CTA head and neck in the setting of known AKI and cardiac cath today with IV contrast load). - Will request neurology consult in a.m. - Patient already on aspirin  and Plavix . - Bedside swallow evaluation been done, no signs of aspiration, resumed back on his diet.  Unstable angina - Status post stent placement today, on aspirin , Plavix  and statin, management per cardiology  A-fib - Eliquis  on hold since Friday in anticipation for cardiac cath today, will await recommendation from cardiology and neurology when safe to resume.  But for now we will hold resuming till MRI brain is obtained.        Family Communication: Family was updated at bedside   Thank you for the consult, we will follow the patient with you in the Hospital.   Brayton Lye M.D on 11/11/2023 at 12:01 AM     Thank you for the consult, we will follow the patient with you in the Hospital.   [facility]   Office  [phone]

## 2023-11-10 NOTE — Progress Notes (Signed)
 Received phone call from nursing that Mr. Nettie developed left arm numbness Similar symptoms 2018, and again last week Workup with CT scan head, MRI last week at [facility]  (Able to perform MRI even in spite of pacer)  Recurrent symptoms this evening left arm numbness Patient and wife concerned given acute onset of symptoms in the setting of recent cardiac catheterization, blood thinners for stenting, concerned about head bleed.  Attempt to place MRI head unsuccessful Received feedback from radiology but they were unable to scan this evening.  Order placed for CT head without contrast in effort to rule out high risk bleed  Signed, Velinda Lunger, MD, Ph.D [facility]

## 2023-11-10 NOTE — Consult Note (Signed)
 [facility] Consult Services   Patient Name:   Thomas [name], Thomas [name] Date of Birth:   [birth year] Date of Service:   11/10/2023 22:11:34  Diagnosis:       G45.9 - Transient cerebral ischemic attack, unspecified  Impression:      89 year old male with history of hypertension, hyperlipidemia, diabetes, atrial fibrillation, who was admitted for management of unstable angina. He developed clumsiness of the left arm after cardiac catheterization. The patient had a similar admission approximately 10 days prior and underwent a full stroke evaluation which did not reveal evidence of acute intracranial infarction. Symptoms are most likely secondary to relative cerebral hypoperfusion in the setting of heart disease and right carotid artery and M1 stenosis. I recommend hydration as this was effective for his recent symptoms. Can obtain repeat MRI of the brain to rule out new stroke as this would inform management on restarting his anticoagulation.  Our recommendations are outlined below.  Recommendations:        Stroke/Telemetry Floor       Neuro Checks (Q4)       Bedside Swallow Eval       DVT Prophylaxis       IV Fluids, Normal Saline       Head of Bed 30 Degrees       Euglycemia and Avoid Hyperthermia (PRN Acetaminophen )       Antihypertensives PRN if Blood pressure is greater than 220/120 or there is a concern for End organ damage/contraindications for permissive HTN. If blood pressure is greater than 220/120 give labetalol PO or IV or Vasotec  IV with a goal of 15% reduction in BP during the first 24 hours.  Sign Out:       Discussed with Primary Attending    ------------------------------------------------------------------------------  Advanced Imaging: Advanced Imaging Deferred because:  Non-disabling symptoms as verified by the patient; no cortical signs so not consistent with LVO  Stroke not suspected with clinical presentation and exam   Metrics: Last  Known Well: 11/10/2023 12:05:00 Dispatch Time: 11/10/2023 22:11:34 Initial Response Time: 11/10/2023 22:21:41 Symptoms: Left arm numbness. Initial patient interaction: 11/10/2023 22:25:55 NIHSS Assessment Completed: 11/10/2023 22:35:39 Patient is not a candidate for Thrombolytic. Thrombolytic Medical Decision: 11/10/2023 22:35:40 Patient was not deemed candidate for Thrombolytic because of following reasons: Stroke severity too mild (non-disabling) . Recent Acute Myocardial infarction (within the previous 3 months) .  CT Head: I personally reviewed all the CT images that were available to me and it showed: No acute intracranial abnormalities  Primary Provider Notified of Diagnostic Impression and Management Plan on: 11/10/2023 22:52:51 Spoke With: Dr. Sherlon Able to Reach 11/10/2023 22:52:51    ------------------------------------------------------------------------------  History of Present Illness: Patient is a 89 year old Male.  Inpatient stroke alert was called for symptoms of Left arm numbness. 89 year old male history of atrial fibrillation on anticoagulation with apixaban , hypertension, hyperlipidemia, diabetes, presenting consult for left arm numbness. The patient was recently evaluated for similar symptoms and underwent a full stroke evaluation which did not reveal evidence of stroke. He does have mild stenosis of the right ICA terminus and right M1. He was treated with intravenous fluids and his symptoms resolved. He was admitted for unstable angina and underwent cardiac catheterization on 11/10/2023. After waking up for the procedure, he had left arm symptoms again.  [id number]    Past Medical History:      Diabetes Mellitus      Hyperlipidemia      Atrial Fibrillation  Medications:  No Anticoagulant use  No Antiplatelet use Reviewed EMR for current medications  Allergies:  Reviewed Description: Baclofen, iodine  Social History: Drug Use: No  Family  History:  There is no family history of premature cerebrovascular disease pertinent to this consultation  ROS : 14 Points Review of Systems was performed and was negative except mentioned in HPI.  Past Surgical History: There Is No Surgical History Contributory To Today's Visit     Examination: BP(129/66), Pulse(71), 1A: Level of Consciousness - Alert; keenly responsive + 0 1B: Ask Month and Age - Both Questions Right + 0 1C: Blink Eyes & Squeeze Hands - Performs Both Tasks + 0 2: Test Horizontal Extraocular Movements - Normal + 0 3: Test Visual Fields - No Visual Loss + 0 4: Test Facial Palsy (Use Grimace if Obtunded) - Normal symmetry + 0 5A: Test Left Arm Motor Drift - No Drift for 10 Seconds + 0 5B: Test Right Arm Motor Drift - No Drift for 10 Seconds + 0 6A: Test Left Leg Motor Drift - No Drift for 5 Seconds + 0 6B: Test Right Leg Motor Drift - No Drift for 5 Seconds + 0 7: Test Limb Ataxia (FNF/Heel-Shin) - Ataxia in 1 Limb + 1 8: Test Sensation - Normal; No sensory loss + 0 9: Test Language/Aphasia - Normal; No aphasia + 0 10: Test Dysarthria - Normal + 0 11: Test Extinction/Inattention - No abnormality + 0  NIHSS Score: 1   Pre-Morbid Modified Rankin Scale: 1 Points = No significant disability despite symptoms; able to carry out all usual duties and activities  Spoke with : Dr. Sherlon I reviewed the available imaging via [facility] and initiated discussion with the primary provider  This consult was conducted in real time using interactive audio and video technology. Patient was informed of the technology being used for this visit and agreed to proceed. Patient located in hospital and provider located at home/office setting.   Patient is being evaluated for possible acute neurologic impairment and high probability of imminent or life-threatening deterioration. I spent total of 37 minutes providing care to this patient, including time for face to face visit via  telemedicine, review of medical records, imaging studies and discussion of findings with providers, the patient and/or family.   Dr Rockey Levering   [facility] For Inpatient follow-up with [facility] physician please call [facility] at [phone]. As we are not an outpatient service for any post hospital discharge needs please contact the hospital for assistance. If you have any questions for the [facility] physicians or need to reconsult for clinical or diagnostic changes please contact us  via [facility] at [phone].

## 2023-11-10 NOTE — Consult Note (Incomplete)
 Patient Demographics  Thomas [name], is a 89 y.o. male   MRN: [id number]   DOB - 1932-08-13  Admit Date - 11/10/2023    Outpatient Primary MD for the patient is [name], MD  Consult requested in the Hospital by Darron Deatrice LABOR, MD, On 11/10/2023    Reason for consult *******   With History of -  Past Medical History:  Diagnosis Date  . Aortic insufficiency    a. 07/2023 Echo: Mod AI; b. 09/2023 Echo: Mild-mod AI.  SABRA Aortic valve disorders   . Arthritis   . Basal cell carcinoma    face, nose left shoulder, left arm (06/19/2016)  . Basal cell carcinoma 09/13/2020   right temple  . BBB (bundle branch block)    hx right  . CAD (coronary artery disease)    a. 10/2017 Cath: mod, nonobs dzs; b. 02/2021 Cath: mod, nonobs dzs; c. 11/2022 Cath: LAD 30ost/p, 57m, D1 min irregs, LCX 65p/m, OM1 min irregs, OM2 40, OM3 nl, RCA 30p, 45m, 40d w/ 40 in side branch-->Med rx.  . Chronic back pain    neck, thoracic, lower back (06/19/2016)  . CKD (chronic kidney disease), stage III (HCC)   . Complete heart block (HCC) 06/2016   a. 06/2016 s/p MDT PPM; b. 01/2023 s/p upgrade to MDT CRT-P (ser # [id number]).  . GERD (gastroesophageal reflux disease)   . Gout   . Heart block    I've had type I, II Wenke before now (06/19/2016)  . History of gout   . History of hiatal hernia    self dx'd (06/19/2016)  . Hyperlipidemia   . Hypertension   . Lyme disease    dx'd by me 2003; cx's showed dx 08/2015  . Migraine    3-4/year (06/19/2016)  . Mitral regurgitation    a. 07/2023 Echo: Mod MR.  SABRA NICM (nonischemic cardiomyopathy - in setting of RV pacing) (HCC)    a. 02/2019 Echo: EF 45-50%; b. 10/2022 Echo: EF 30-35%; c. 01/2023 CRT-P upgrade; e. 07/2023 Echo: EF 40-45%, glob HK; f. 10/2023 Echo: EF of 55 to 60% with grade 1 DD, nl RV fxn, triv MR, mild to mod AI, and mildly dilated ascending aorta at 42 mm.   SABRA PAF (paroxysmal atrial fibrillation) (HCC)    a. CHA2DS2VASc = 7--> eliquis ; b. On tikosyn .  . Paroxysmal atrial flutter (HCC)   . Presence of permanent cardiac pacemaker   . PVC's (premature ventricular contractions)   . Renal cancer, left (HCC) 2006   S/P cryotherapy  . Spinal stenosis    cervical, 1 thoracic, lumbar (06/19/2016)  . Squamous carcinoma    face, nose left shoulder, left arm (06/19/2016)  . Stroke (HCC)   . TIA (transient ischemic attack) 06/14/2016   I'm not sure that's what it was (06/25/2016)  . Visit for monitoring Tikosyn  therapy 09/09/2017      Past Surgical History:  Procedure Laterality Date  . ANKLE FRACTURE SURGERY Right 1967  . BACK SURGERY  05/07/2020  . BASAL CELL CARCINOMA EXCISION     face, nose left shoulder, left arm  . BIOPSY PROSTATE  2001 & 2003  . BIV UPGRADE N/A 01/10/2023   Procedure: BIV UPGRADE;  Surgeon: Fernande Elspeth BROCKS, MD;  Location[facility] CV LAB;  Service: Cardiovascular;  Laterality: N/A;  . CARDIAC CATHETERIZATION  1990's  . CARDIOVERSION N/A 09/11/2017   Procedure: CARDIOVERSION;  Surgeon: Pietro Redell RAMAN, MD;  Location: [facility] ENDOSCOPY;  Service: Cardiovascular;  Laterality: N/A;  . FRACTURE SURGERY    . HOLEP-LASER ENUCLEATION OF THE PROSTATE WITH MORCELLATION N/A 07/10/2020   Procedure: HOLEP-LASER ENUCLEATION OF THE PROSTATE WITH MORCELLATION;  Surgeon: Penne Knee, MD;  Location: [facility] ORS;  Service: Urology;  Laterality: N/A;  . INGUINAL HERNIA REPAIR Left 2012  . INSERT / REPLACE / REMOVE PACEMAKER  06/19/2016  . LAPAROSCOPIC ABLATION RENAL MASS    . LEFT HEART CATH AND CORONARY ANGIOGRAPHY Left 10/23/2017   Procedure: LEFT HEART CATH AND CORONARY ANGIOGRAPHY;  Surgeon: Darron Deatrice LABOR, MD;  Location: [facility] INVASIVE CV LAB;  Service: Cardiovascular;  Laterality: Left;  . LEFT HEART CATH AND CORONARY ANGIOGRAPHY Left 02/26/2021   Procedure: LEFT HEART CATH AND CORONARY ANGIOGRAPHY;  Surgeon: Darron Deatrice LABOR, MD;   Location: [facility] INVASIVE CV LAB;  Service: Cardiovascular;  Laterality: Left;  . PACEMAKER IMPLANT N/A 06/19/2016   Procedure: Pacemaker Implant;  Surgeon: Elspeth BROCKS Fernande, MD;  Location: [facility] INVASIVE CV LAB;  Service: Cardiovascular;  Laterality: N/A;  . pacemasker    . PROSTATE SURGERY    . RIGHT/LEFT HEART CATH AND CORONARY ANGIOGRAPHY Bilateral 11/25/2022   Procedure: RIGHT/LEFT HEART CATH AND CORONARY ANGIOGRAPHY;  Surgeon: Darron Deatrice LABOR, MD;  Location: [facility] INVASIVE CV LAB;  Service: Cardiovascular;  Laterality: Bilateral;  . SQUAMOUS CELL CARCINOMA EXCISION     face, nose left shoulder, left arm  . TONSILLECTOMY AND ADENOIDECTOMY      in for   No chief complaint on file.    HPI  Thomas [name]  is a 89 y.o. male, ******    Review of Systems    In addition to the HPI above, **** No Fever-chills, No Headache, No changes with Vision or hearing, No problems swallowing food or Liquids, No Chest pain, Cough or Shortness of Breath, No Abdominal pain, No Nausea or Vommitting, Bowel movements are regular, No Blood in stool or Urine, No dysuria, No new skin rashes or bruises, No new joints pains-aches,  No new weakness, tingling, numbness in any extremity, No recent weight gain or loss, No polyuria, polydypsia or polyphagia, No significant Mental Stressors.  A full 10 point Review of Systems was done, except as stated above, all other Review of Systems were negative.   Social History Social History   Tobacco Use  . Smoking status: Never  . Smokeless tobacco: Never  Substance Use Topics  . Alcohol  use: Yes    Alcohol /week: 2.0 standard drinks of alcohol     Types: 2 Glasses of wine per week   ********  Family History Family History  Problem Relation Age of Onset  . Heart attack Brother   . Stroke Brother   . Aortic stenosis Mother   . Arthritis Father    ********  Prior to Admission medications   Medication Sig Start Date End Date Taking? Authorizing  Provider  acetaminophen  (TYLENOL ) 500 MG tablet Take 500-1,000 mg by mouth every 6 (six) hours as needed for mild pain (pain score 1-3) or fever.   Yes [provider]  Artificial Tears ophthalmic solution Place 1 drop into both eyes in the morning, at noon, in the evening, and at bedtime.   Yes [provider]  atorvastatin  (LIPITOR ) 80 MG tablet Take 1 tablet (80 mg total) by mouth daily. 10/31/23  Yes Regalado, Belkys A, MD  buPROPion  (WELLBUTRIN  XL) 150 MG 24 hr tablet Take 150 mg by mouth daily. 10/29/22  Yes [provider]  calcium  carbonate (TUMS - DOSED IN MG ELEMENTAL CALCIUM ) 500 MG chewable tablet Chew 1-2 tablets by mouth daily as needed for indigestion.   Yes [provider]  Coenzyme Q10 (COQ10) 100 MG CAPS Take 100 mg by mouth daily.   Yes [provider]  diphenhydrAMINE  (BENADRYL ) 50 MG tablet Take 1 tablet (50 mg total) by mouth once for 1 dose. 1 hour prior to test 11/05/23 11/10/23 Yes [name], NP  docusate sodium  (COLACE) 100 MG capsule Take 100 mg by mouth daily as needed for mild constipation.   Yes [provider]  dofetilide  (TIKOSYN ) 125 MCG capsule TAKE 1 CAPSULE BY MOUTH TWICE DAILY 07/22/23  Yes Fernande Elspeth BROCKS, MD  doxycycline  (VIBRA -TABS) 100 MG tablet Take 100 mg by mouth daily. 10/10/23  Yes [provider]  DULoxetine  (CYMBALTA ) 20 MG capsule Take 20 mg by mouth 2 (two) times daily.   Yes [provider]  febuxostat  (ULORIC ) 40 MG tablet Take 1 tablet (40 mg total) by mouth daily. 08/21/21  Yes [name], MD  isosorbide  mononitrate (IMDUR ) 30 MG 24 hr tablet Take 0.5 tablets (15 mg total) by mouth daily. 10/03/23 01/01/24 Yes [name], NP  ketoconazole  (NIZORAL ) 2 % cream Apply to the feet every night to prevent fungal infection. 10/29/23  Yes Hester Alm BROCKS, MD  meclizine  (ANTIVERT ) 12.5 MG tablet Take 12.5 mg by mouth 3 (three) times daily. 10/01/23  Yes  [provider]  metoprolol  succinate (TOPROL -XL) 50 MG 24 hr tablet TAKE 1 TABLET BY MOUTH DAILY WITH OR IMMEDIATELY FOLLOWING A MEAL Patient taking differently: Take 50 mg by mouth at bedtime. 04/16/23  Yes Fernande Elspeth BROCKS, MD  metroNIDAZOLE  (FLAGYL ) 500 MG tablet TAKE 1 TABLET BY MOUTH TWICE DAILY ON SUNDAY AND MONDAY. 11/23/21  Yes [name], MD  omeprazole (PRILOSEC) 20 MG capsule Take 20 mg by mouth daily as needed (for heartburn- 30 minutes before a meal). 05/16/21  Yes [provider]  predniSONE  (DELTASONE ) 50 MG tablet Take 1 tablet by mouth 13 hrs prior to test, another tablet 7 hours prior, and last dose 1 hour prior. 11/05/23  Yes [name], NP  sacubitril -valsartan  (ENTRESTO ) 24-26 MG Take 1 tablet by mouth 2 (two) times daily. 04/16/23  Yes Fernande Elspeth BROCKS, MD  terbinafine  (LAMISIL ) 250 MG tablet Take 1 tablet (250 mg total) by mouth daily. 10/29/23  Yes Hester Alm BROCKS, MD  Testosterone  1.62 % GEL APPLY 2 PUMPS DAILY 10/03/23  Yes McGowan, Clotilda A, PA-C  apixaban  (ELIQUIS ) 2.5 MG TABS tablet Take 1 tablet (2.5 mg total) by mouth 2 (two) times daily. 12/05/22   Fernande Elspeth BROCKS, MD  NEEDLE, DISP, 18 G (BD DISP NEEDLES) 18G X 1-1/2 MISC 1 mg by Does not apply route every 14 (fourteen) days. 05/26/20   Helon Clotilda A, PA-C  NEEDLE, DISP, 21 G (BD DISP NEEDLES) 21G X 1-1/2 MISC 1 mg by Does not apply route every 14 (fourteen) days. 05/26/20   Helon Clotilda A, PA-C  nitroGLYCERIN  (NITROSTAT ) 0.4 MG  SL tablet Place 1 tablet (0.4 mg total) under the tongue every 5 (five) minutes as needed for chest pain. 10/03/23 01/01/24  [name], NP  Syringe, Disposable, (2-3CC SYRINGE) 3 ML MISC 1 mg by Does not apply route every 14 (fourteen) days. 05/26/20   Helon Kirsch A, PA-C  testosterone  cypionate (DEPOTESTOSTERONE CYPIONATE) 200 MG/ML injection INJECT 1 ML (200 MG TOTAL) INTO THE MUSCLE EVERY 28 DAYS 10/08/23   McGowan, Kirsch A, PA-C     Anti-infectives (From admission, onward)    None       Scheduled Meds: . [START ON 11/11/2023]  stroke: early stages of recovery book   Does not apply Once  . artificial tears  1 drop Both Eyes TID  . [START ON 11/11/2023] aspirin   81 mg Oral Daily  . atorvastatin   80 mg Oral Daily  . [START ON 11/11/2023] buPROPion   150 mg Oral Daily  . [START ON 11/11/2023] clopidogrel   75 mg Oral Q breakfast  . diphenhydrAMINE       . dofetilide   125 mcg Oral BID  . DULoxetine   20 mg Oral BID  . [START ON 11/11/2023] febuxostat   40 mg Oral Daily  . [START ON 11/11/2023] isosorbide  mononitrate  15 mg Oral Daily  . meclizine   12.5 mg Oral TID  . metoprolol  succinate  50 mg Oral QHS  . pantoprazole   40 mg Oral Daily  . sacubitril -valsartan   1 tablet Oral BID  . sodium chloride  flush  3 mL Intravenous Q12H   Continuous Infusions: . sodium chloride     . [START ON 11/11/2023] sodium chloride      PRN Meds:.sodium chloride , acetaminophen , calcium  carbonate, diphenhydrAMINE , docusate sodium , nitroGLYCERIN , ondansetron  (ZOFRAN ) IV, sodium chloride  flush  Allergies  Allergen Reactions  . Other Other (See Comments)    Vicryl sutures - patient states that site becomes soupy  . Baclofen     Severe delirium   . Iodinated Contrast Media Hives  . Iodine Hives and Other (See Comments)    IVP contrast  . Penicillins Itching, Rash and Other (See Comments)    ITCHY FEELING IN FINGERS; Tolerated Zosyn  04/2023    Physical Exam  Vitals  Blood pressure 131/68, pulse 71, temperature (!) 97.5 F (36.4 C), temperature source Oral, resp. rate 18, height 5' 5 (1.651 m), weight 68 kg, SpO2 97%.   1. General ******* lying in bed in NAD,  ***********  2. Normal affect and insight, Not Suicidal or Homicidal, Awake Alert, Oriented X 3.  3. No F.N deficits, ALL C.Nerves Intact, Strength 5/5 all 4 extremities, Sensation intact all 4 extremities, Plantars down going.  4. Ears and Eyes appear Normal, Conjunctivae  clear, PERRLA. Moist Oral Mucosa.  5. Supple Neck, No JVD, No cervical lymphadenopathy appriciated, No Carotid Bruits.  6. Symmetrical Chest wall movement, Good air movement bilaterally, CTAB.  7. RRR, No Gallops, Rubs or Murmurs, No Parasternal Heave.  8. Positive Bowel Sounds, Abdomen Soft, No tenderness, No organomegaly appriciated,No rebound -guarding or rigidity.  9.  No Cyanosis, Normal Skin Turgor, No Skin Rash or Bruise.  10. Good muscle tone,  joints appear normal , no effusions, Normal ROM.  11. No Palpable Lymph Nodes in Neck or Axillae  **************  Data Review  CBC No results for input(s): WBC, HGB, HCT, PLT, MCV, MCH, MCHC, RDW, LYMPHSABS, MONOABS, EOSABS, BASOSABS, BANDABS in the last 168 hours.  Invalid input(s): NEUTRABS, BANDSABD ------------------------------------------------------------------------------------------------------------------  Chemistries  No results for input(s): NA, K, CL, CO2, GLUCOSE, BUN, CREATININE, CALCIUM ,  MG, AST, ALT, ALKPHOS, BILITOT in the last 168 hours.  Invalid input(s): GFRCGP ------------------------------------------------------------------------------------------------------------------ estimated creatinine clearance is 25.8 mL/min (A) (by C-G formula based on SCr of 1.69 mg/dL (H)). ------------------------------------------------------------------------------------------------------------------ No results for input(s): TSH, T4TOTAL, T3FREE, THYROIDAB in the last 72 hours.  Invalid input(s): FREET3   Coagulation profile No results for input(s): INR, PROTIME in the last 168 hours. ------------------------------------------------------------------------------------------------------------------- No results for input(s): DDIMER in the last 72  hours. -------------------------------------------------------------------------------------------------------------------  Cardiac Enzymes No results for input(s): CKMB, TROPONINI, MYOGLOBIN in the last 168 hours.  Invalid input(s): CK ------------------------------------------------------------------------------------------------------------------ Invalid input(s): POCBNP   ---------------------------------------------------------------------------------------------------------------  Urinalysis    Component Value Date/Time   COLORURINE AMBER (A) 04/29/2023 1455   APPEARANCEUR Clear 05/14/2023 1101   LABSPEC 1.012 04/29/2023 1455   PHURINE 5.0 04/29/2023 1455   GLUCOSEU Negative 05/14/2023 1101   HGBUR MODERATE (A) 04/29/2023 1455   BILIRUBINUR Negative 05/14/2023 1101   KETONESUR NEGATIVE 04/29/2023 1455   PROTEINUR Negative 05/14/2023 1101   PROTEINUR 100 (A) 04/29/2023 1455   UROBILINOGEN 0.2 07/19/2020 1700   NITRITE Negative 05/14/2023 1101   NITRITE POSITIVE (A) 04/29/2023 1455   LEUKOCYTESUR Trace (A) 05/14/2023 1101   LEUKOCYTESUR LARGE (A) 04/29/2023 1455     Imaging results:   CT HEAD CODE STROKE WO CONTRAST` Result Date: 11/10/2023 CLINICAL DATA:  Code stroke. Neuro deficit, acute, stroke suspected Transient ischemic attack (TIA) left arm numb, on blood thinners post cath stent placement, hx of CVA EXAM: CT HEAD WITHOUT CONTRAST TECHNIQUE: Contiguous axial images were obtained from the base of the skull through the vertex without intravenous contrast. RADIATION DOSE REDUCTION: This exam was performed according to the departmental dose-optimization program which includes automated exposure control, adjustment of the mA and/or kV according to patient size and/or use of iterative reconstruction technique. COMPARISON:  MRI head October 30, 2023.  CT head October 28, 2023 FINDINGS: Brain: No evidence of acute infarction, hemorrhage, hydrocephalus, extra-axial  collection or mass lesion/mass effect. Vascular: No hyperdense vessel or unexpected calcification. Skull: No acute fracture. Sinuses/Orbits: Clear stenosis.  No acute orbital findings. ASPECTS [facility] Stroke Program Early CT Score) Total score (0-10 with 10 being normal): 10. IMPRESSION: No evidence of acute intracranial abnormality.  ASPECTS 10. Finding discussed with Dr. Glynis via telephone at 10:23 PM. Electronically Signed   By: Gilmore GORMAN Molt M.D.   On: 11/10/2023 22:23   CARDIAC CATHETERIZATION Result Date: 11/10/2023 .  Mid LAD lesion is 60% stenosed. .  Prox Cx to Mid Cx lesion is 65% stenosed. .  Dist RCA lesion is 40% stenosed with 40% stenosed side branch in RPAV. SABRA  Prox RCA lesion is 30% stenosed. .  Mid RCA lesion is 70% stenosed. SABRA Pais 2nd Mrg to 2nd Mrg lesion is 40% stenosed. SABRA Pais LAD to Prox LAD lesion is 90% stenosed. .  A drug-eluting stent was successfully placed using a STENT ONYX FRONTIER [id number]. SABRA  Post intervention, there is a 0% residual stenosis. 1.  Known borderline three-vessel coronary artery disease with progression of proximal LAD stenosis from 40% to 90%.  This is the only difference from last catheterization in 2024. 2.  Left ventricular angiography was not performed due to chronic kidney disease.  Normal left ventricular end-diastolic pressure. 3.  Successful angioplasty and drug-eluting stent placement to the proximal LAD. Recommendations: I suspect that the proximal LAD stenosis is the culprit for the patient's recent anginal symptoms.  The rest of his coronary artery disease was present last year and  he was asymptomatic at that time. Eliquis  can be resumed tomorrow if no bleeding issues.  Recommend stopping aspirin  after 1 week and continue clopidogrel  for a minimum of 6 months. Will hydrate the patient overnight given chronic kidney disease.  85 mL of contrast was used for the procedure.    My personal review of EKG: Rhythm NSR, Rate  ** /min, QTc *** , no Acute  ST changes    Assessment & Plan  Principal Problem:   Angina pectoris (HCC)    1.    DVT Prophylaxis Heparin  -  Lovenox  - SCDs ***  AM Labs Ordered, also please review Full Orders  Family Communication: Plan discussed with patient and **   Thank you for the consult, we will follow the patient with you in the Hospital.   Brayton Lye M.D on 11/10/2023 at 11:41 PM  Between 7am to 7pm - Pager - [phone]  After 7pm go to [email or web address] - password [email or web address]   Thank you for the consult, we will follow the patient with you in the Hospital.   [facility]   Office  [phone]

## 2023-11-10 NOTE — Progress Notes (Signed)
PHARMACIST - PHYSICIAN ORDER COMMUNICATION  CONCERNING: P&T Medication Policy on Herbal Medications  DESCRIPTION:  This patient's order for:  CoQ10 100 mg  has been noted.  This product(s) is classified as an "herbal" or natural product. Due to a lack of definitive safety studies or FDA approval, nonstandard manufacturing practices, plus the potential risk of unknown drug-drug interactions while on inpatient medications, the Pharmacy and Therapeutics Committee does not permit the use of "herbal" or natural products of this type within [place].   ACTION TAKEN: The pharmacy department is unable to verify this order at this time and your patient has been informed of this safety policy. Please reevaluate patient's clinical condition at discharge and address if the herbal or natural product(s) should be resumed at that time.  

## 2023-11-10 NOTE — Progress Notes (Addendum)
 Pt is complaining of numbness on left arm. Neuro checks completed. MD Gollan made aware.   Update 2020: MD Perla spoke with the pt in the room.  Update 2044: See new orders.  Update 2140: RRR code stroke was called. Wife self administer Tikosyn  to patient at bedside. Patient and wife was educated about the protocol in the hospital.  Update 2144: Code stroke team came at bedside and assesed pt and placed order.  Update 2258: Pt was transported back from CT. Swallow evaluation completed. MD Elgergawy made aware.  Update 2356: Please see new orders.

## 2023-11-10 NOTE — Interval H&P Note (Signed)
 History and Physical Interval Note:  11/10/2023 12:05 PM  Thomas [name]  has presented today for surgery, with the diagnosis of L Cath   Unstable angina.  The various methods of treatment have been discussed with the patient and family. After consideration of risks, benefits and other options for treatment, the patient has consented to  Procedure(s): LEFT HEART CATH AND CORONARY ANGIOGRAPHY (Left) as a surgical intervention.  The patient's history has been reviewed, patient examined, no change in status, stable for surgery.  I have reviewed the patient's chart and labs.  Questions were answered to the patient's satisfaction.     [name]

## 2023-11-11 ENCOUNTER — Encounter: Payer: Self-pay | Admitting: Internal Medicine

## 2023-11-11 ENCOUNTER — Ambulatory Visit

## 2023-11-11 ENCOUNTER — Encounter: Payer: Self-pay | Admitting: Cardiovascular Disease

## 2023-11-11 DIAGNOSIS — E785 Hyperlipidemia, unspecified: Secondary | ICD-10-CM | POA: Diagnosis not present

## 2023-11-11 DIAGNOSIS — I130 Hypertensive heart and chronic kidney disease with heart failure and stage 1 through stage 4 chronic kidney disease, or unspecified chronic kidney disease: Secondary | ICD-10-CM | POA: Diagnosis not present

## 2023-11-11 DIAGNOSIS — I080 Rheumatic disorders of both mitral and aortic valves: Secondary | ICD-10-CM | POA: Diagnosis not present

## 2023-11-11 DIAGNOSIS — I200 Unstable angina: Secondary | ICD-10-CM | POA: Diagnosis not present

## 2023-11-11 DIAGNOSIS — I2584 Coronary atherosclerosis due to calcified coronary lesion: Secondary | ICD-10-CM | POA: Diagnosis not present

## 2023-11-11 DIAGNOSIS — R262 Difficulty in walking, not elsewhere classified: Secondary | ICD-10-CM | POA: Diagnosis not present

## 2023-11-11 DIAGNOSIS — I25110 Atherosclerotic heart disease of native coronary artery with unstable angina pectoris: Secondary | ICD-10-CM | POA: Diagnosis not present

## 2023-11-11 DIAGNOSIS — I6782 Cerebral ischemia: Secondary | ICD-10-CM | POA: Diagnosis not present

## 2023-11-11 DIAGNOSIS — G459 Transient cerebral ischemic attack, unspecified: Secondary | ICD-10-CM | POA: Diagnosis not present

## 2023-11-11 DIAGNOSIS — Z79899 Other long term (current) drug therapy: Secondary | ICD-10-CM | POA: Diagnosis not present

## 2023-11-11 DIAGNOSIS — Z7902 Long term (current) use of antithrombotics/antiplatelets: Secondary | ICD-10-CM | POA: Diagnosis not present

## 2023-11-11 DIAGNOSIS — Z7901 Long term (current) use of anticoagulants: Secondary | ICD-10-CM

## 2023-11-11 DIAGNOSIS — I442 Atrioventricular block, complete: Secondary | ICD-10-CM | POA: Diagnosis not present

## 2023-11-11 DIAGNOSIS — I428 Other cardiomyopathies: Secondary | ICD-10-CM | POA: Diagnosis not present

## 2023-11-11 DIAGNOSIS — N1832 Chronic kidney disease, stage 3b: Secondary | ICD-10-CM | POA: Diagnosis not present

## 2023-11-11 DIAGNOSIS — E1122 Type 2 diabetes mellitus with diabetic chronic kidney disease: Secondary | ICD-10-CM | POA: Diagnosis not present

## 2023-11-11 DIAGNOSIS — I679 Cerebrovascular disease, unspecified: Secondary | ICD-10-CM

## 2023-11-11 DIAGNOSIS — I5022 Chronic systolic (congestive) heart failure: Secondary | ICD-10-CM | POA: Diagnosis not present

## 2023-11-11 DIAGNOSIS — G4733 Obstructive sleep apnea (adult) (pediatric): Secondary | ICD-10-CM | POA: Diagnosis not present

## 2023-11-11 DIAGNOSIS — Z85528 Personal history of other malignant neoplasm of kidney: Secondary | ICD-10-CM | POA: Diagnosis not present

## 2023-11-11 DIAGNOSIS — G8192 Hemiplegia, unspecified affecting left dominant side: Secondary | ICD-10-CM

## 2023-11-11 DIAGNOSIS — I480 Paroxysmal atrial fibrillation: Secondary | ICD-10-CM | POA: Diagnosis not present

## 2023-11-11 DIAGNOSIS — M6281 Muscle weakness (generalized): Secondary | ICD-10-CM | POA: Diagnosis not present

## 2023-11-11 DIAGNOSIS — Z950 Presence of cardiac pacemaker: Secondary | ICD-10-CM | POA: Diagnosis not present

## 2023-11-11 DIAGNOSIS — I639 Cerebral infarction, unspecified: Secondary | ICD-10-CM

## 2023-11-11 LAB — BASIC METABOLIC PANEL WITH GFR
Anion gap: 9 (ref 5–15)
BUN: 35 mg/dL — ABNORMAL HIGH (ref 8–23)
CO2: 24 mmol/L (ref 22–32)
Calcium: 9 mg/dL (ref 8.9–10.3)
Chloride: 104 mmol/L (ref 98–111)
Creatinine, Ser: 1.54 mg/dL — ABNORMAL HIGH (ref 0.61–1.24)
GFR, Estimated: 43 mL/min — ABNORMAL LOW (ref >60)
Glucose, Bld: 125 mg/dL — ABNORMAL HIGH (ref 70–99)
Potassium: 4.6 mmol/L (ref 3.5–5.1)
Sodium: 137 mmol/L (ref 135–145)

## 2023-11-11 LAB — CBC
HCT: 38 % — ABNORMAL LOW (ref 39.0–52.0)
Hemoglobin: 12.8 g/dL — ABNORMAL LOW (ref 13.0–17.0)
MCH: 29 pg (ref 26.0–34.0)
MCHC: 33.7 g/dL (ref 30.0–36.0)
MCV: 86.2 fL (ref 80.0–100.0)
Platelets: 175 K/uL (ref 150–400)
RBC: 4.41 MIL/uL (ref 4.22–5.81)
RDW: 13.9 % (ref 11.5–15.5)
WBC: 16.7 K/uL — ABNORMAL HIGH (ref 4.0–10.5)
nRBC: 0 % (ref 0.0–0.2)

## 2023-11-11 LAB — LIPID PANEL
Cholesterol: 117 mg/dL (ref 0–200)
HDL: 55 mg/dL (ref >40)
LDL Cholesterol: 56 mg/dL (ref 0–99)
Total CHOL/HDL Ratio: 2.1 ratio
Triglycerides: 30 mg/dL (ref <150)
VLDL: 6 mg/dL (ref 0–40)

## 2023-11-11 LAB — POCT ACTIVATED CLOTTING TIME: Activated Clotting Time: 314 s

## 2023-11-11 MED ORDER — PANTOPRAZOLE SODIUM 40 MG PO TBEC
40.0000 mg | DELAYED_RELEASE_TABLET | Freq: Every day | ORAL | 3 refills | Status: AC
Start: 2023-11-11 — End: ?

## 2023-11-11 MED ORDER — APIXABAN 2.5 MG PO TABS
2.5000 mg | ORAL_TABLET | Freq: Two times a day (BID) | ORAL | 3 refills | Status: AC
Start: 2023-11-11 — End: ?

## 2023-11-11 MED ORDER — METOPROLOL SUCCINATE ER 25 MG PO TB24: 50.0000 mg | ORAL_TABLET | Freq: Every day | ORAL | 3 refills | Status: AC

## 2023-11-11 MED ORDER — CLOPIDOGREL BISULFATE 75 MG PO TABS: 75.0000 mg | ORAL_TABLET | Freq: Every day | ORAL | 3 refills | Status: AC

## 2023-11-11 MED ORDER — ASPIRIN 81 MG PO CHEW: 81.0000 mg | CHEWABLE_TABLET | Freq: Every day | ORAL | 0 refills | Status: DC

## 2023-11-11 NOTE — Progress Notes (Addendum)
 NEUROLOGY CONSULT FOLLOW UP NOTE   Date of service: November 11, 2023 Patient Name: Thomas [name] MRN:  [id number] DOB:  02-Dec-1932  Interval Hx/subjective  Seen and examined Seen yesterday consultation by telestroke: 89 year old history of hypertension hyperlipidemia diabetes atrial fibrillation on Eliquis  admitted for management of unstable angina and underwent already catheterization following which at night developed left arm incoordination and clumsiness.  Similar episode around 10 days ago for which a full stroke workup was done at [facility].  Workup revealed right ICA and right M1 stenoses. Overnight assessment-most likely hypoperfusion in the setting of dehydration/hypovolemia. Patient continues to report some left hand clumsiness that has not completely resolved. MRI brain was recommended but he has a pacemaker-unable to do MRIs with pacemakers at this facility. No other symptoms reported.  Vitals   Vitals:   11/11/23 0018 11/11/23 0526 11/11/23 0830 11/11/23 1052  BP: 129/68 (!) 148/75 (!) 148/73 (!) 156/79  Pulse:  97 66 69  Resp:  18 18 18   Temp: 97.7 F (36.5 C) (!) 97.5 F (36.4 C) (!) 97.5 F (36.4 C) (!) 97.5 F (36.4 C)  TempSrc: Oral  Oral Oral  SpO2: 95% 94% 95% 97%  Weight:      Height:         Body mass index is 24.96 kg/m.  Physical Exam  General: Elderly gentleman, comfortable in bed HEENT: Normocephalic atraumatic Lungs: Clear Cardiovascular: Regular rate rhythm Neurological exam He is awake alert oriented x 3. No gross dysarthria or aphasia Cranial nerves II to XII intact Motor examination with no drift in any of the 4 extremities.  Finger taps equal bilaterally. Sensory: Minimally different/diminished sensation in the left hand in comparison to the right to light touch.  Otherwise intact Coordination: No dysmetria  Medications  Current Facility-Administered Medications:    0.9 %  sodium chloride  infusion, 250 mL, Intravenous, PRN,  Arida, Muhammad A, MD   acetaminophen  (TYLENOL ) tablet 500-1,000 mg, 500-1,000 mg, Oral, Q6H PRN, Arida, Muhammad A, MD   artificial tears ophthalmic solution 1 drop, 1 drop, Both Eyes, TID, Darron, Muhammad A, MD, 1 drop at 11/11/23 9048   aspirin  chewable tablet 81 mg, 81 mg, Oral, Daily, Arida, Muhammad A, MD, 81 mg at 11/11/23 9052   atorvastatin  (LIPITOR ) tablet 80 mg, 80 mg, Oral, Daily, Arida, Muhammad A, MD, 80 mg at 11/11/23 0025   buPROPion  (WELLBUTRIN  XL) 24 hr tablet 150 mg, 150 mg, Oral, Daily, Arida, Muhammad A, MD, 150 mg at 11/11/23 9052   calcium  carbonate (TUMS - dosed in mg elemental calcium ) chewable tablet 200-400 mg of elemental calcium , 1-2 tablet, Oral, Daily PRN, Arida, Muhammad A, MD   clopidogrel  (PLAVIX ) tablet 75 mg, 75 mg, Oral, Q breakfast, Arida, Muhammad A, MD, 75 mg at 11/11/23 0946   docusate sodium  (COLACE) capsule 100 mg, 100 mg, Oral, Daily PRN, Arida, Muhammad A, MD   dofetilide  (TIKOSYN ) capsule 125 mcg, 125 mcg, Oral, BID, Arida, Muhammad A, MD, 125 mcg at 11/11/23 9063   DULoxetine  (CYMBALTA ) DR capsule 20 mg, 20 mg, Oral, BID, Arida, Muhammad A, MD, 20 mg at 11/11/23 9052   febuxostat  (ULORIC ) tablet 40 mg, 40 mg, Oral, Daily, Arida, Muhammad A, MD, 40 mg at 11/11/23 9052   isosorbide  mononitrate (IMDUR ) 24 hr tablet 15 mg, 15 mg, Oral, Daily, Arida, Muhammad A, MD, 15 mg at 11/11/23 9051   meclizine  (ANTIVERT ) tablet 12.5 mg, 12.5 mg, Oral, TID, Arida, Muhammad A, MD, 12.5 mg at 11/11/23 0023  metoprolol  succinate (TOPROL -XL) 24 hr tablet 50 mg, 50 mg, Oral, QHS, Arida, Muhammad A, MD   nitroGLYCERIN  (NITROSTAT ) SL tablet 0.4 mg, 0.4 mg, Sublingual, Q5 min PRN, Darron, Muhammad A, MD   ondansetron  (ZOFRAN ) injection 4 mg, 4 mg, Intravenous, Q6H PRN, Darron Deatrice LABOR, MD   pantoprazole  (PROTONIX ) EC tablet 40 mg, 40 mg, Oral, Daily, Darron Deatrice A, MD, 40 mg at 11/11/23 0024   sacubitril -valsartan  (ENTRESTO ) 24-26 mg per tablet, 1 tablet, Oral, BID,  Darron, Muhammad A, MD   sodium chloride  flush (NS) 0.9 % injection 3 mL, 3 mL, Intravenous, Q12H, Arida, Muhammad A, MD, 3 mL at 11/11/23 0900   sodium chloride  flush (NS) 0.9 % injection 3 mL, 3 mL, Intravenous, PRN, Darron Deatrice LABOR, MD  Labs and Diagnostic Imaging   CBC:  Recent Labs  Lab 11/11/23 0431  WBC 16.7*  HGB 12.8*  HCT 38.0*  MCV 86.2  PLT 175    Basic Metabolic Panel:  Lab Results  Component Value Date   NA 137 11/11/2023   K 4.6 11/11/2023   CO2 24 11/11/2023   GLUCOSE 125 (H) 11/11/2023   BUN 35 (H) 11/11/2023   CREATININE 1.54 (H) 11/11/2023   CALCIUM  9.0 11/11/2023   GFRNONAA 43 (L) 11/11/2023   GFRAA 50 (L) 02/07/2020   Lipid Panel:  Lab Results  Component Value Date   LDLCALC 56 11/11/2023   HgbA1c:  Lab Results  Component Value Date   HGBA1C 6.2 (H) 10/28/2023   Urine Drug Screen:     Component Value Date/Time   LABOPIA NONE DETECTED 10/29/2023 0035   COCAINSCRNUR NONE DETECTED 10/29/2023 0035   LABBENZ NONE DETECTED 10/29/2023 0035   AMPHETMU NONE DETECTED 10/29/2023 0035   THCU NONE DETECTED 10/29/2023 0035   LABBARB NONE DETECTED 10/29/2023 0035    Alcohol  Level     Component Value Date/Time   ETH <15 10/28/2023 2308   INR  Lab Results  Component Value Date   INR 1.1 10/28/2023   APTT  Lab Results  Component Value Date   APTT 31 10/28/2023   CT Head without contrast(Personally reviewed): No acute intracranial abnormality  MR angio Head without contrast (Personally reviewed): From 10/30/2023-mild to moderate bilateral P2 stenosis.  Mild right ICA terminus and distal left M1 stenosis.  Assessment   Thomas [name] is a 89 y.o. male past history as above currently on Eliquis , developed left-sided hand clumsiness after cardiac catheterization yesterday. Has been evaluated for similar episode less than 2 weeks ago with full stroke workup. Symptoms improving-minimal sensory deficit on the left hand and subjective feeling of  incoordination of the left hand. Symptom could be associated with hypoperfusion in the setting of right carotid stenosis versus a cardioembolic event periprocedurally. Deficits are mild and I do not think we need to transfer him to [facility] to get an MRI.  Will repeat head CT in about an hour or so around 2 PM to make sure there is no large evolving infarct and no evidence of bleed should suffice so that he can continue to safely use his Eliquis .  Impression: Right hemispheric TIA  Recommendations  Medical management per primary team and internal medicine CT head at 2 PM.  If no evidence of large evolving infarct or bleed-can continue Eliquis . Has recently completed full stroke workup and statin dose was increased-continue on that dose of statin for now. Avoid hypotension.  His blood pressure goal, if okay with cardiology, should be slightly higher-I would  recommend keeping his goal closer to 140/90 rather than 120/80. Follow-up with [facility] neurology in September as initially planned. Plan was discussed in person with Dr. Cephus and relayed via secure chat to Dr. Darron, cardiologist  Addendum Repeat head CT-formal read pending-no large evolving stroke.  Okay to continue Eliquis .  Plan as above. Communicated with cardiology MD and APP as well as hospitalist MD.  ______________________________________________________________________   Signed, Eligio Lav, MD [facility]

## 2023-11-11 NOTE — Discharge Instructions (Signed)
No driving for 1 week. No lifting over 10 lbs for 2 weeks. No sexual activity for 2 weeks.  Keep procedure site clean & dry. If you notice increased pain, swelling, bleeding or pus, call/return!  You may shower, but no soaking baths/hot tubs/pools for 1 week.   

## 2023-11-11 NOTE — Evaluation (Signed)
 Occupational Therapy Evaluation Patient Details Name: Thomas [name] MRN: [id number] DOB: July 10, 1932 Today's Date: 11/11/2023   History of Present Illness   Pt is an  89 year old male who was admitted for management of unstable angina. He developed clumsiness of the left arm after cardiac catheterization 7/7, workup for TIA. Per neurology note symptoms secondary to relative cerebral hypoperfusion in the setting of heart disease and right carotid artery and M1 stenosis.  PMhx: PAF on Eliquis , HTN, HLD, prediabetes, OSA on CPAP, dominant left vertebral artery occlusion, TIA, SSS s/p PPM, BPH, renal CA, lumbar fusion, A-fib.     Clinical Impressions Pt was seen for OT evaluation this date. PTA, pt resides at home with his wife where he is very active and independent at baseline without AD use.  Pt presents to acute OT demonstrating impaired ADL performance and functional mobility 2/2 weakness, balance deficits and L sided coordination deficits. Pt currently requires SBA for bed mobility, CGA for STS d/t impulsivity. Pt has RUE in sling to prevent use d/t recent cardiac cath. Pt placed RUE in platform attachment for ambulation and ambulated ~113ft with CGA and cues for pacing, safety, keeping upright posture and to keep L hand in proper position on walker as it did fall off once during ambulation bout. Pt with notable LUE coordination deficits during nose to finger, opposition and picking up various items in the room, e.g. toothbrush, taking cap off toothpaste, etc. Mild LLE deficits noted during gait as well. Provided written handout and activity to perform to maximize L hand coordination/dexterity. L grip strength and LUE ROM intact.  Pt would benefit from skilled OT services to address noted impairments and functional limitations to maximize safety and independence while minimizing falls risk and caregiver burden. Do anticipate the need for follow up OT services upon acute hospital DC.      If plan  is discharge home, recommend the following:   Assistance with cooking/housework;A little help with walking and/or transfers;A little help with bathing/dressing/bathroom;Help with stairs or ramp for entrance     Functional Status Assessment   Patient has had a recent decline in their functional status and demonstrates the ability to make significant improvements in function in a reasonable and predictable amount of time.     Equipment Recommendations   Tub/shower seat     Recommendations for Other Services   Rehab consult     Precautions/Restrictions   Precautions Precautions: Fall Recall of Precautions/Restrictions: Intact Restrictions Weight Bearing Restrictions Per Provider Order: No Other Position/Activity Restrictions: RUE broken wing after cardiac cath to radial     Mobility Bed Mobility Overal bed mobility: Needs Assistance Bed Mobility: Supine to Sit, Sit to Supine     Supine to sit: Supervision Sit to supine: Supervision   General bed mobility comments: RUE in sling d/t recent heart cath    Transfers Overall transfer level: Needs assistance Equipment used: Rolling walker (2 wheels) Transfers: Sit to/from Stand Sit to Stand: Contact guard assist           General transfer comment: from EOB, impulsively stood; progressed ambulation ~170 ft around nursing station using RW with R platform attachment with CGA, good pace and safety; cues for looking forward with x1 instance of L hand falling off RW      Balance Overall balance assessment: Needs assistance Sitting-balance support: Feet supported Sitting balance-Leahy Scale: Good     Standing balance support: Single extremity supported, Bilateral upper extremity supported, Reliant on assistive device for balance, During functional  activity Standing balance-Leahy Scale: Poor Standing balance comment: RW with R platform attachment and x1 instance of LUE falling off RW handle, CGA                            ADL either performed or assessed with clinical judgement   ADL Overall ADL's : Needs assistance/impaired                         Toilet Transfer: Contact guard assist;Rolling walker (2 wheels) Toilet Transfer Details (indicate cue type and reason): simulated from bed with R platform attachment on RW         Functional mobility during ADLs: Rolling walker (2 wheels);Contact guard assist       Vision         Perception         Praxis         Pertinent Vitals/Pain Pain Assessment Pain Assessment: No/denies pain     Extremity/Trunk Assessment Upper Extremity Assessment Upper Extremity Assessment: Right hand dominant;LUE deficits/detail LUE Deficits / Details: L hand GMC/FMC deficits, grip strength intact; chart review states pt has h/o of dropping things at home with L hand LUE Coordination: decreased gross motor;decreased fine motor   Lower Extremity Assessment Lower Extremity Assessment: Defer to PT evaluation       Communication Communication Communication: No apparent difficulties   Cognition Arousal: Alert Behavior During Therapy: [facility] for tasks assessed/performed                                 Following commands: Intact       Cueing  General Comments          Exercises Other Exercises Other Exercises: Edu on role of OT in acute setting. Other Exercises: Provided handout and items to continue performing LUE/hand FMC/dexterity exercises to improve functional use. They verbalized understanding.   Shoulder Instructions      Home Living Family/patient expects to be discharged to:: Private residence Living Arrangements: Spouse/significant other Available Help at Discharge: Family;Available 24 hours/day Type of Home: House Home Access: Stairs to enter [employer] of Steps: 2 Entrance Stairs-Rails: None Home Layout: One level     Bathroom Shower/Tub: Tub/shower unit;Walk-in shower    Bathroom Toilet: Handicapped height     Home Equipment: None          Prior Functioning/Environment Prior Level of Function : Independent/Modified Independent             Mobility Comments: no AD for mobility, reports some furniture surfing ADLs Comments: Ind with ADLs, IADLs    OT Problem List: Decreased coordination;Impaired balance (sitting and/or standing)   OT Treatment/Interventions: Self-care/ADL training;Therapeutic exercise;Therapeutic activities;Patient/family education;DME and/or AE instruction;Balance training      OT Goals(Current goals can be found in the care plan section)   Acute Rehab OT Goals Patient Stated Goal: improve L hand use OT Goal Formulation: With patient/family Time For Goal Achievement: 11/25/23 Potential to Achieve Goals: Good ADL Goals Pt Will Perform Lower Body Bathing: sitting/lateral leans;sit to/from stand;with modified independence;with supervision Pt Will Perform Lower Body Dressing: with supervision;sit to/from stand;sitting/lateral leans Pt Will Transfer to Toilet: with modified independence;with supervision;ambulating;regular height toilet Additional ADL Goal #1: Pt will demo FMC/dexterity HEP with independence to promote improved L hand functional use.   OT Frequency:  Min 2X/week    Co-evaluation  AM-PAC OT 6 Clicks Daily Activity     Outcome Measure Help from another person eating meals?: None Help from another person taking care of personal grooming?: A Little Help from another person toileting, which includes using toliet, bedpan, or urinal?: A Little Help from another person bathing (including washing, rinsing, drying)?: A Lot Help from another person to put on and taking off regular upper body clothing?: A Little Help from another person to put on and taking off regular lower body clothing?: A Lot 6 Click Score: 17   End of Session Equipment Utilized During Treatment: Rolling walker (2 wheels)  (R platform attachment) Nurse Communication: Mobility status  Activity Tolerance: Patient tolerated treatment well Patient left: in bed;with call bell/phone within reach;with family/visitor present;with bed alarm set  OT Visit Diagnosis: Unsteadiness on feet (R26.81);Other abnormalities of gait and mobility (R26.89)                Time: 1510-1533 OT Time Calculation (min): 23 min Charges:  OT General Charges $OT Visit: 1 Visit OT Evaluation $OT Eval Moderate Complexity: 1 Mod OT Treatments $Therapeutic Exercise: 8-22 mins Wakeelah Solan, OTR/L 11/11/23, 4:04 PM  Treyveon Mochizuki E Braxon Suder 11/11/2023, 4:00 PM

## 2023-11-11 NOTE — Progress Notes (Addendum)
 SLP Cancellation Note  Patient Details Name: Thomas [name] MRN: [id number] DOB: 05/17/1932   Cancelled treatment:       Reason Eval/Treat Not Completed: SLP screened, no needs identified, will sign off (chart reviewed; consulted NSG and then met w/ pt and Wife in room.)  Pt is an 89 year old w/ history of hypertension, hyperlipidemia, diabetes, atrial fibrillation on Eliquis  admitted for management of unstable angina and underwent cardiac catheterization following which appeared to be left arm incoordination and clumsiness. Similar episode around 10 days ago for which a full stroke workup was done at [facility] that revealed right ICA and right M1 stenoses; pt still has left hand clumsiness but no other deficits reported by pt/Wife/NSG.   Pt denied any difficulty swallowing and is currently on a regular diet; tolerates swallowing pills w/ water per NSG. Pt conversed in  general conversation w/out overt expressive/receptive deficits noted; pt denied any speech-language deficits. Speech intelligible. Wife and pt endorsed drowsiness since his procedure in setting of Medications and lack of sleep d/t noisy neighbor. Pt recalled recent medical events and described his activities around the house, including that he had been going to cardipulmonary outpatient. Pt is currently working w/ PT/OT to determine his needs. No further skilled ST services indicated as pt appears at his baseline. Pt agreed. NSG to reconsult if any change in status while admitted.      Comer Portugal, MS, CCC-SLP Speech Language Pathologist Rehab Services; [facility] [phone] (ascom) Elvis Boot 11/11/2023, 12:49 PM

## 2023-11-11 NOTE — Plan of Care (Signed)
  Problem: Education: Goal: Understanding of CV disease, CV risk reduction, and recovery process will improve Outcome: Adequate for Discharge Goal: Individualized Educational Video(s) Outcome: Adequate for Discharge   Problem: Activity: Goal: Ability to return to baseline activity level will improve Outcome: Adequate for Discharge   Problem: Cardiovascular: Goal: Ability to achieve and maintain adequate cardiovascular perfusion will improve Outcome: Adequate for Discharge Goal: Vascular access site(s) Level 0-1 will be maintained Outcome: Adequate for Discharge   Problem: Health Behavior/Discharge Planning: Goal: Ability to safely manage health-related needs after discharge will improve Outcome: Adequate for Discharge   Problem: Education: Goal: Knowledge of General Education information will improve Description: Including pain rating scale, medication(s)/side effects and non-pharmacologic comfort measures Outcome: Adequate for Discharge   Problem: Health Behavior/Discharge Planning: Goal: Ability to manage health-related needs will improve Outcome: Adequate for Discharge   Problem: Clinical Measurements: Goal: Ability to maintain clinical measurements within normal limits will improve Outcome: Adequate for Discharge Goal: Will remain free from infection Outcome: Adequate for Discharge Goal: Diagnostic test results will improve Outcome: Adequate for Discharge Goal: Respiratory complications will improve Outcome: Adequate for Discharge Goal: Cardiovascular complication will be avoided Outcome: Adequate for Discharge   Problem: Activity: Goal: Risk for activity intolerance will decrease Outcome: Adequate for Discharge   Problem: Nutrition: Goal: Adequate nutrition will be maintained Outcome: Adequate for Discharge   Problem: Coping: Goal: Level of anxiety will decrease Outcome: Adequate for Discharge   Problem: Elimination: Goal: Will not experience complications  related to bowel motility Outcome: Adequate for Discharge Goal: Will not experience complications related to urinary retention Outcome: Adequate for Discharge   Problem: Pain Managment: Goal: General experience of comfort will improve and/or be controlled Outcome: Adequate for Discharge   Problem: Safety: Goal: Ability to remain free from injury will improve Outcome: Adequate for Discharge   Problem: Skin Integrity: Goal: Risk for impaired skin integrity will decrease Outcome: Adequate for Discharge   Problem: Education: Goal: Knowledge of disease or condition will improve Outcome: Adequate for Discharge Goal: Knowledge of secondary prevention will improve (MUST DOCUMENT ALL) Outcome: Adequate for Discharge Goal: Knowledge of patient specific risk factors will improve (DELETE if not current risk factor) Outcome: Adequate for Discharge   Problem: Ischemic Stroke/TIA Tissue Perfusion: Goal: Complications of ischemic stroke/TIA will be minimized Outcome: Adequate for Discharge   Problem: Coping: Goal: Will verbalize positive feelings about self Outcome: Adequate for Discharge Goal: Will identify appropriate support needs Outcome: Adequate for Discharge   Problem: Health Behavior/Discharge Planning: Goal: Ability to manage health-related needs will improve Outcome: Adequate for Discharge Goal: Goals will be collaboratively established with patient/family Outcome: Adequate for Discharge   Problem: Self-Care: Goal: Ability to participate in self-care as condition permits will improve Outcome: Adequate for Discharge Goal: Verbalization of feelings and concerns over difficulty with self-care will improve Outcome: Adequate for Discharge Goal: Ability to communicate needs accurately will improve Outcome: Adequate for Discharge   Problem: Nutrition: Goal: Risk of aspiration will decrease Outcome: Adequate for Discharge Goal: Dietary intake will improve Outcome: Adequate for  Discharge

## 2023-11-11 NOTE — Final Progress Note (Signed)
 Seen and examined.  Discussed with wife at bedside.  Patient is medically cleared for discharge.  Case was discussed with neurology and primary team.

## 2023-11-11 NOTE — Evaluation (Signed)
 Physical Therapy Evaluation Patient Details Name: Thomas [name] MRN: [id number] DOB: 04/09/1933 Today's Date: 11/11/2023  History of Present Illness  Pt is an  89 year old male who was admitted for management of unstable angina. He developed clumsiness of the left arm after cardiac catheterization 7/7, workup for TIA. Per neurology note symptoms secondary to relative cerebral hypoperfusion in the setting of heart disease and right carotid artery and M1 stenosis.  PMhx: PAF on Eliquis , HTN, HLD, prediabetes, OSA on CPAP, dominant left vertebral artery occlusion, TIA, SSS s/p PPM, BPH, renal CA, lumbar fusion, A-fib.   Clinical Impression  Patient A&Ox4, denied pain. Due to heart cath, pt NWB in RUE for 24-48hrs (48hr per RN). Pt reported at baseline he is independent, lives with his wife. Upon assessment LLE strength WFLs, but deficits in gross and fine motor coordination noted with all movements. CGA for sit <> Stand with RW, encouraged to use RUE only as a steadying assist, difficulty noted with grasp of LUE on RW, pt primarily uses thumb and index. CGA-minA to ambulate ~182ft. Decreased coordination, stride length, foot placement, and staggered step noted. Returned to room and in bed for RN assessment.  Overall the patient demonstrated deficits (see PT Problem List) that impede the patient's functional abilities, safety, and mobility and would benefit from skilled PT intervention (>3hrs) to maximize safety and return to PLOF.         If plan is discharge home, recommend the following: A little help with walking and/or transfers;A little help with bathing/dressing/bathroom;A lot of help with walking and/or transfers;Assist for transportation;Help with stairs or ramp for entrance;Assistance with cooking/housework   Can travel by private vehicle        Equipment Recommendations BSC/3in1 (pt reported having RW at home)  Recommendations for Other Services       Functional Status Assessment  Patient has had a recent decline in their functional status and demonstrates the ability to make significant improvements in function in a reasonable and predictable amount of time.     Precautions / Restrictions Precautions Precautions: None Recall of Precautions/Restrictions: Intact Restrictions Weight Bearing Restrictions Per Provider Order: No      Mobility  Bed Mobility Overal bed mobility: Needs Assistance Bed Mobility: Supine to Sit     Supine to sit: Contact guard, HOB elevated     General bed mobility comments: NWB on RUE due to recent heart cath, able to use LUE    Transfers Overall transfer level: Needs assistance Equipment used: Rolling walker (2 wheels) Transfers: Sit to/from Stand Sit to Stand: Contact guard assist           General transfer comment: steadying assist    Ambulation/Gait Ambulation/Gait assistance: Contact guard assist, Min assist Gait Distance (Feet): 170 Feet Assistive device: Rolling walker (2 wheels)   Gait velocity: decreased     General Gait Details: several instances of shuffled step/ difficulty with LLE coordination and placement. staggered step noted.  Stairs            Wheelchair Mobility     Tilt Bed    Modified Rankin (Stroke Patients Only)       Balance Overall balance assessment: Needs assistance Sitting-balance support: Feet supported Sitting balance-Leahy Scale: Good     Standing balance support: Bilateral upper extremity supported, During functional activity Standing balance-Leahy Scale: Poor  Pertinent Vitals/Pain Pain Assessment Pain Assessment: No/denies pain    Home Living Family/patient expects to be discharged to:: Private residence Living Arrangements: Spouse/significant other Available Help at Discharge: Family;Available 24 hours/day Type of Home: House Home Access: Stairs to enter Entrance Stairs-Rails: None Entrance Stairs-Number of  Steps: 2   Home Layout: One level Home Equipment: None      Prior Function Prior Level of Function : Independent/Modified Independent             Mobility Comments: no AD for mobility, reports some furniture surfing ADLs Comments: Ind with ADLs, IADLs     Extremity/Trunk Assessment   Upper Extremity Assessment Upper Extremity Assessment: Defer to OT evaluation    Lower Extremity Assessment Lower Extremity Assessment: RLE deficits/detail;LLE deficits/detail RLE Deficits / Details: WFLs RLE Sensation: WNL LLE Deficits / Details: strength functional, but impaired coordination, decreased awareness of limb in space noted LLE Coordination: decreased fine motor;decreased gross motor       Communication        Cognition Arousal: Alert Behavior During Therapy: WFL for tasks assessed/performed                             Following commands: Intact       Cueing       General Comments      Exercises     Assessment/Plan    PT Assessment Patient needs continued PT services  PT Problem List Decreased strength;Decreased coordination;Decreased activity tolerance;Decreased balance;Decreased mobility;Decreased knowledge of precautions       PT Treatment Interventions DME instruction;Balance training;Gait training;Neuromuscular re-education;Stair training;Functional mobility training;Patient/family education;Therapeutic activities;Therapeutic exercise    PT Goals (Current goals can be found in the Care Plan section)  Acute Rehab PT Goals Patient Stated Goal: to get back to PLOF Time For Goal Achievement: 11/25/23 Potential to Achieve Goals: Good    Frequency Min 3X/week     Co-evaluation               AM-PAC PT 6 Clicks Mobility  Outcome Measure Help needed turning from your back to your side while in a flat bed without using bedrails?: A Little Help needed moving from lying on your back to sitting on the side of a flat bed without using  bedrails?: A Little Help needed moving to and from a bed to a chair (including a wheelchair)?: A Little Help needed standing up from a chair using your arms (e.g., wheelchair or bedside chair)?: A Little Help needed to walk in hospital room?: A Little Help needed climbing 3-5 steps with a railing? : A Little 6 Click Score: 18    End of Session Equipment Utilized During Treatment: Gait belt Activity Tolerance: Patient tolerated treatment well Patient left: in bed;with call bell/phone within reach;with bed alarm set;with nursing/sitter in room Nurse Communication: Mobility status PT Visit Diagnosis: Other abnormalities of gait and mobility (R26.89);Difficulty in walking, not elsewhere classified (R26.2);Muscle weakness (generalized) (M62.81)    Time: 9098-9075 PT Time Calculation (min) (ACUTE ONLY): 23 min   Charges:   PT Evaluation $PT Eval Low Complexity: 1 Low PT Treatments $Therapeutic Activity: 8-22 mins PT General Charges $$ ACUTE PT VISIT: 1 Visit       Doyal Shams PT, DPT 11:57 AM,11/11/23

## 2023-11-11 NOTE — Discharge Summary (Signed)
 Discharge Summary   Patient ID: Thomas [name] MRN: [id number]; DOB: Oct 22, 1932  Admit date: 11/10/2023 Discharge date: 11/11/2023  PCP:  [name], MD   [facility] Providers Cardiologist:  [name], MD  Cardiology APP:  [name], NP  Electrophysiologist:  Elspeth Sage, MD    Discharge Diagnoses  Principal Problem:   Unstable angina [facility]) Active Problems:   Hyperlipidemia   TIA (transient ischemic attack)   Complete heart block [facility])   S/P placement of cardiac pacemaker   Paroxysmal atrial fibrillation (HCC)   CAD (coronary artery disease)   Essential hypertension   CKD stage 3b, GFR 30-44 ml/min [facility])   Diagnostic Studies/Procedures   LHC 11/10/23   Mid LAD lesion is 60% stenosed.   Prox Cx to Mid Cx lesion is 65% stenosed.   Dist RCA lesion is 40% stenosed with 40% stenosed side branch in RPAV.   Prox RCA lesion is 30% stenosed.   Mid RCA lesion is 70% stenosed.   Ost 2nd Mrg to 2nd Mrg lesion is 40% stenosed.   Ost LAD to Prox LAD lesion is 90% stenosed.   A drug-eluting stent was successfully placed using a STENT ONYX FRONTIER 3.5X15.   Post intervention, there is a 0% residual stenosis.   1.  Known borderline three-vessel coronary artery disease with progression of proximal LAD stenosis from 40% to 90%.  This is the only difference from last catheterization in 2024. 2.  Left ventricular angiography was not performed due to chronic kidney disease.  Normal left ventricular end-diastolic pressure. 3.  Successful angioplasty and drug-eluting stent placement to the proximal LAD.   Recommendations: I suspect that the proximal LAD stenosis is the culprit for the patient's recent anginal symptoms.  The rest of his coronary artery disease was present last year and he was asymptomatic at that time. Eliquis  can be resumed tomorrow if no bleeding issues.  Recommend stopping aspirin  after 1 week and continue clopidogrel  for a minimum of 6  months. Will hydrate the patient overnight given chronic kidney disease.  85 mL of contrast was used for the procedure.  Coronary Diagrams  Diagnostic Dominance: Right  Intervention     _____________   History of Present Illness   Thomas [name] is a 89 y.o. male with a history of nonobstructive CAD, CHB, NICM in the setting of prolonged RV pacing s/p CRT-P (2024), chronic heart failure with midrange EF, moderate MR/AI, HTN, HLD, paroxysmal Afib and aflutter on Tikosyn  and Eliquis , TIA/stroke, and CKD stage 3 s/p partial nephrectomy due to renal cell carcinoma who is being seen for unstable angina.   In the setting of admission for stroke, he was noted to have Afib and CHB. He underwent PPM implant 06/2016. He underwent Tikosyn  loading and has been on Eliquis . Diagnostic cath in 2019 showed moderate nonobstructive CAD.   In June 2024 he was noted to have drop in EF 30-35% with global HK. The was down from 45-50%. He reported exertional chest discomfort. He underwent diagnostic cath in July 2024 which showed stable 3V CAD with moderately calcified coronary arteries. RHC showed normal filling pressures and normal cardiac output. Medical therapy was recommended with no FFR of the RCA and circumflex with possible PCI could be considered in the future for refractory angina.  He was seen in the office May 2025 reporting unstable angina and Imdur  was added  He was admitted October 28, 2023 with fatigue and left-sided clumsiness. MRI of the brain showed no  acute stroke with mild to moderate stenosis of the P2 segments of the posterior cerebral arteries bilaterally and mild stenosis of the right ICA terminus in the distal left M1 segment. Echo showed LVEF 55-60%, G1DD.   He was seen back in the office 11/05/23 reporting persistent chest pain despite Imdur . it was agreed upon to pursue cardiac cath. Eliquis  was held 2 days prior to procedure.   Hospital Course   Consultants: Internal Medicine and  Neurology   He presented to [facility]/10/23 for procedure. LHC showed known borderline 3V CAD with progression of pLAD from 40% to 90%. He was treated with DES placement to the pLAD. The patient was lept overnight to hydrate given CKD. Later that night, he developed left arm numbness and weakness (similar to prior symptoms). Head CT was ordered, and Internal medicine and neurology were consulted.   Left arm numbness TIA vs CVA - recent admission 10 days ago and underwent full stroke work-up which did not reveal evidence of acute infarction.  - suspect symptoms are due to hypoperfusion in the setting of heart disease and right carotid artery and M1 stenosis - no MRI due to pacemaker - CT head non-acute - neurology recommend BP goal 140/90 - continue statin - repeat Head CT with nothing acute, OK to restart Eliquis  7/8 PM dose - we will continue ASA, Plavix  and Eliquis  x 1 week, then stop ASA - plan to follow-up with [facility] neurology as outpatient - will place PT referral  CAD - s/p PCI to pLAD - cath site stable with mild hematoma, pulses good - continue ASA, Plavix , Eliquis  x 1 week, then will stop ASA - continue Lipitor  80mg  daily - continue Toprol  25mg  daily (will decrease to allow for BP room) - continue Imdur  15mg  daily  HFimEF - most recent echo showed improved EF 55-60% - no LV gram on cath due to CKD - continue Entresto  24-26mg BID and Toprol   Paroxysmal Afib - Eliquis  held 7/4 in anticipation of cardiac cath - plan to resume Eliquis  7/8 PM dose - continue Tikosyn  125mcg BID  CKD stage 3 - Scr stable at 1.54  CHB s/p PPM - normally functioning device - AV pacing  Physical Exam GEN: Well nourished, well developed, in no acute distress. HEENT: normal. Neck: Supple, no JVD, carotid bruits, or masses. Cardiac: RRR, distant, no murmurs, rubs, or gallops. No clubbing, cyanosis, edema.  Radials 2+/PT 2+ and equal bilaterally.  Respiratory:  Respirations regular and unlabored,  clear to auscultation bilaterally. GI: Soft, nontender, nondistended, BS + x 4. MS: no deformity or atrophy. Skin: warm and dry, no rash. Neuro:  Strength and sensation are intact. Psych: Normal affect.     Did the patient have an acute coronary syndrome (MI, NSTEMI, STEMI, etc) this admission?:  No                               Did the patient have a percutaneous coronary intervention (stent / angioplasty)?:  Yes.     Cath/PCI Registry Performance & Quality Measures: Aspirin  prescribed? - Yes ADP Receptor Inhibitor (Plavix /Clopidogrel , Brilinta/Ticagrelor or Effient/Prasugrel) prescribed (includes medically managed patients)? - Yes High Intensity Statin (Lipitor  40-80mg  or Crestor 20-40mg ) prescribed? - Yes For EF <40%, was ACEI/ARB prescribed? - Not Applicable (EF >/= 40%) For EF <40%, Aldosterone Antagonist (Spironolactone or Eplerenone) prescribed? - Not Applicable (EF >/= 40%) Cardiac Rehab Phase II ordered? - Yes  _____________  Discharge Vitals Blood pressure (!) 108/56, pulse 61, temperature (!) 97.5 F (36.4 C), temperature source Oral, resp. rate 16, height 5' 5 (1.651 m), weight 68 kg, SpO2 94%.  Filed Weights   11/10/23 0723  Weight: 68 kg    Labs & Radiologic Studies  CBC Recent Labs    11/11/23 0431  WBC 16.7*  HGB 12.8*  HCT 38.0*  MCV 86.2  PLT 175   Basic Metabolic Panel Recent Labs    92/91/74 0431  NA 137  K 4.6  CL 104  CO2 24  GLUCOSE 125*  BUN 35*  CREATININE 1.54*  CALCIUM  9.0   Liver Function Tests No results for input(s): AST, ALT, ALKPHOS, BILITOT, PROT, ALBUMIN  in the last 72 hours. No results for input(s): LIPASE, AMYLASE in the last 72 hours. High Sensitivity Troponin:   No results for input(s): TROPONINIHS in the last 720 hours.  No results for input(s): TRNPT in the last 720 hours.  BNP Invalid input(s): POCBNP No results for input(s): PROBNP in the last 72 hours.  No results for input(s):  BNP in the last 72 hours.  D-Dimer No results for input(s): DDIMER in the last 72 hours. Hemoglobin A1C No results for input(s): HGBA1C in the last 72 hours. Fasting Lipid Panel Recent Labs    11/11/23 0431  CHOL 117  HDL 55  LDLCALC 56  TRIG 30  CHOLHDL 2.1   No results found for: LIPOA  Thyroid  Function Tests No results for input(s): TSH, T4TOTAL, T3FREE, THYROIDAB in the last 72 hours.  Invalid input(s): FREET3 _____________  CT HEAD WO CONTRAST ( ) Result Date: 11/11/2023 CLINICAL DATA:  Provided history: Stroke, follow-up. EXAM: CT HEAD WITHOUT CONTRAST TECHNIQUE: Contiguous axial images were obtained from the base of the skull through the vertex without intravenous contrast. RADIATION DOSE REDUCTION: This exam was performed according to the departmental dose-optimization program which includes automated exposure control, adjustment of the mA and/or kV according to patient size and/or use of iterative reconstruction technique. COMPARISON:  Head CT 11/10/2023.  Brain MRI 10/30/2023. FINDINGS: Brain: Generalized cerebral atrophy. Patchy and ill-defined hypoattenuation within the cerebral white matter, nonspecific but compatible with mild chronic small vessel ischemic disease. There is no acute intracranial hemorrhage. No demarcated cortical infarct. No extra-axial fluid collection. No evidence of an intracranial mass. No midline shift. Vascular: No hyperdense vessel. Atherosclerotic calcifications. Skull: No calvarial fracture or aggressive osseous lesion. Sinuses/Orbits: No mass or acute finding within the imaged orbits. No significant paranasal sinus disease. IMPRESSION: 1.  No evidence of an acute intracranial abnormality. 2. Parenchymal atrophy and chronic small vessel ischemic disease. Electronically Signed   By: Rockey Childs D.O.   On: 11/11/2023 15:19   CT HEAD CODE STROKE WO CONTRAST` Result Date: 11/10/2023 CLINICAL DATA:  Code stroke. Neuro deficit, acute,  stroke suspected Transient ischemic attack (TIA) left arm numb, on blood thinners post cath stent placement, hx of CVA EXAM: CT HEAD WITHOUT CONTRAST TECHNIQUE: Contiguous axial images were obtained from the base of the skull through the vertex without intravenous contrast. RADIATION DOSE REDUCTION: This exam was performed according to the departmental dose-optimization program which includes automated exposure control, adjustment of the mA and/or kV according to patient size and/or use of iterative reconstruction technique. COMPARISON:  MRI head October 30, 2023.  CT head October 28, 2023 FINDINGS: Brain: No evidence of acute infarction, hemorrhage, hydrocephalus, extra-axial collection or mass lesion/mass effect. Vascular: No hyperdense vessel or unexpected calcification. Skull: No acute fracture. Sinuses/Orbits: Clear  stenosis.  No acute orbital findings. ASPECTS [facility] Stroke Program Early CT Score) Total score (0-10 with 10 being normal): 10. IMPRESSION: No evidence of acute intracranial abnormality.  ASPECTS 10. Finding discussed with Dr. Glynis via telephone at 10:23 PM. Electronically Signed   By: Gilmore GORMAN Molt M.D.   On: 11/10/2023 22:23   CARDIAC CATHETERIZATION Result Date: 11/10/2023   Mid LAD lesion is 60% stenosed.   Prox Cx to Mid Cx lesion is 65% stenosed.   Dist RCA lesion is 40% stenosed with 40% stenosed side branch in RPAV.   Prox RCA lesion is 30% stenosed.   Mid RCA lesion is 70% stenosed.   Ost 2nd Mrg to 2nd Mrg lesion is 40% stenosed.   Ost LAD to Prox LAD lesion is 90% stenosed.   A drug-eluting stent was successfully placed using a STENT ONYX FRONTIER 3.5X15.   Post intervention, there is a 0% residual stenosis. 1.  Known borderline three-vessel coronary artery disease with progression of proximal LAD stenosis from 40% to 90%.  This is the only difference from last catheterization in 2024. 2.  Left ventricular angiography was not performed due to chronic kidney disease.  Normal left  ventricular end-diastolic pressure. 3.  Successful angioplasty and drug-eluting stent placement to the proximal LAD. Recommendations: I suspect that the proximal LAD stenosis is the culprit for the patient's recent anginal symptoms.  The rest of his coronary artery disease was present last year and he was asymptomatic at that time. Eliquis  can be resumed tomorrow if no bleeding issues.  Recommend stopping aspirin  after 1 week and continue clopidogrel  for a minimum of 6 months. Will hydrate the patient overnight given chronic kidney disease.  85 mL of contrast was used for the procedure.   CUP PACEART REMOTE DEVICE CHECK Result Date: 11/04/2023 PPM Scheduled remote reviewed. Normal device function.  Presenting rhythm:  AP-BIV paced.  HF diagnostics have been abnormal in this monitoring period.  31 AHR detections, EGMs consistent with AFL, 11% of episodes pace-terminated, total burden 0.1%.  History of AF, on Eliquis  per Epic. Next remote 91 days. - CS, CVRS  VAS US  CAROTID Result Date: 11/01/2023 Carotid Arterial Duplex Study Patient Name:  Thomas [name] [facility]  Date of Exam:   10/30/2023 Medical Rec #: [id number]      Accession #:    [id number] Date of Birth: 1932/07/12      Patient Gender: M Patient Age:   70 years Exam Location:  [facility] Procedure:      VAS US  CAROTID Referring Phys: EARLE DE LA TORRE --------------------------------------------------------------------------------  Indications:       CVA. Risk Factors:      Hypertension, hyperlipidemia, no history of smoking, coronary                    artery disease, prior CVA. Other Factors:     Afib, PM, CHF, CKD, TIA. Comparison Study:  Previous exam on 06/15/2016 was WNL Performing Technologist: Ezzie Potters RVT, RDMS  Examination Guidelines: A complete evaluation includes B-mode imaging, spectral Doppler, color Doppler, and power Doppler as needed of all accessible portions of each vessel. Bilateral testing is considered an integral part of a complete  examination. Limited examinations for reoccurring indications may be performed as noted.  Right Carotid Findings: +----------+--------+--------+--------+------------------+------------------+           PSV cm/sEDV cm/sStenosisPlaque DescriptionComments           +----------+--------+--------+--------+------------------+------------------+ CCA Prox  73  10                                intimal thickening +----------+--------+--------+--------+------------------+------------------+ CCA Distal70      9                                 intimal thickening +----------+--------+--------+--------+------------------+------------------+ ICA Prox  59      12              focal and calcific                   +----------+--------+--------+--------+------------------+------------------+ ICA Distal75      21                                                   +----------+--------+--------+--------+------------------+------------------+ ECA       58      0                                                    +----------+--------+--------+--------+------------------+------------------+ +----------+--------+-------+----------------+-------------------+           PSV cm/sEDV cmsDescribe        Arm Pressure (mmHG) +----------+--------+-------+----------------+-------------------+ [id number]            Multiphasic, WNL                    +----------+--------+-------+----------------+-------------------+ +---------+--------+--+--------+-+----------------------------+ VertebralPSV cm/s35EDV cm/s0Antegrade and High resistant +---------+--------+--+--------+-+----------------------------+  Left Carotid Findings: +----------+--------+--------+--------+------------------+------------------+           PSV cm/sEDV cm/sStenosisPlaque DescriptionComments           +----------+--------+--------+--------+------------------+------------------+ CCA Prox  108     13                                 intimal thickening +----------+--------+--------+--------+------------------+------------------+ CCA Mid                                             intimal thickening +----------+--------+--------+--------+------------------+------------------+ CCA Distal92      13              focal and calcific                   +----------+--------+--------+--------+------------------+------------------+ ICA Prox  53      14                                                   +----------+--------+--------+--------+------------------+------------------+ ICA Distal78      23                                                   +----------+--------+--------+--------+------------------+------------------+  ECA       59      0                                                    +----------+--------+--------+--------+------------------+------------------+ +----------+--------+--------+----------------+-------------------+           PSV cm/sEDV cm/sDescribe        Arm Pressure (mmHG) +----------+--------+--------+----------------+-------------------+ [id number]             Multiphasic, WNL                    +----------+--------+--------+----------------+-------------------+ +---------+--------+--+--------+--+---------+ VertebralPSV cm/s36EDV cm/s10Antegrade +---------+--------+--+--------+--+---------+   Summary: Right Carotid: Velocities in the right ICA are consistent with a 1-39% stenosis. Left Carotid: Velocities in the left ICA are consistent with a 1-39% stenosis. Vertebrals:  Bilateral vertebral arteries demonstrate antegrade flow. Subclavians: Normal flow hemodynamics were seen in bilateral subclavian              arteries. *See table(s) above for measurements and observations.  Electronically signed by Eather Popp MD on 11/01/2023 at 12:29:41 PM.    Final    MR BRAIN WO CONTRAST Result Date: 10/30/2023 CLINICAL DATA:  Stroke follow-up. EXAM: MRI HEAD  WITHOUT CONTRAST TECHNIQUE: Multiplanar, multiecho pulse sequences of the brain and surrounding structures were obtained without intravenous contrast. COMPARISON:  CT of the head dated October 28, 2023. FINDINGS: Brain: There is no restricted diffusion to indicate acute or recent infarction. There is a moderate amount of generalized cerebral volume loss present. There is no evidence of hemorrhage, mass, cortical infarction or hydrocephalus. There is mild periventricular white matter disease. Vascular: Normal vascular flow voids. Skull and upper cervical spine: Normal marrow signal. Sinuses/Orbits: No acute process. Other: None. IMPRESSION: 1. Age-related atrophy and mild periventricular white matter disease. No apparent acute process. Electronically Signed   By: Evalene Coho M.D.   On: 10/30/2023 15:00   MR ANGIO HEAD WO CONTRAST Result Date: 10/30/2023 CLINICAL DATA:  Stroke, follow up EXAM: MRA HEAD WITHOUT CONTRAST TECHNIQUE: Angiographic images of the Circle of Willis were acquired using MRA technique without intravenous contrast. COMPARISON:  CT of the head dated October 28, 2023. FINDINGS: Anterior circulation: There is mild to moderate stenosis of the right ICA terminus, proximally 30-40%. The left internal carotid artery is patent. There is mild stenosis of the distal M1 segment of the left middle cerebral artery. The M2 and M3 branches appeared normal in caliber bilaterally. The anterior cerebral arteries and their branches are normal in caliber. No evidence of aneurysm or large vessel occlusion. Posterior circulation: The left vertebral artery is dominant. The right vertebral artery is hypoplastic and appears to terminate in the posteroinferior cerebellar artery. There is mild to moderate stenosis of the proximal P2 segment of the left posterior cerebral artery. There is also mild to moderate stenosis of the distal P2 segment of the right posterior cerebral artery. The cerebellar arteries are patent.  Anatomic variants: None. Other: None. IMPRESSION: 1. Mild to moderate stenoses of the P2 segments of the posterior cerebral arteries bilaterally. 2. Mild stenosis of the right ICA terminus and the distal left M1 segment. Electronically Signed   By: Evalene Coho M.D.   On: 10/30/2023 14:55   ECHOCARDIOGRAM COMPLETE Result Date: 10/29/2023    ECHOCARDIOGRAM REPORT   Patient Name:   Thomas [name] [facility] Date of  Exam: 10/29/2023 Medical Rec #:  [id number]     Height:       65.0 in Accession #:    [id number]    Weight:       147.7 lb Date of Birth:  03/21/1933     BSA:          1.739 m Patient Age:    89 years      BP:           134/98 mmHg Patient Gender: M             HR:           60 bpm. Exam Location:  Inpatient Procedure: Color Doppler and Cardiac Doppler (Both Spectral and Color Flow            Doppler were utilized during procedure). Indications:    TIA  History:        Patient has prior history of Echocardiogram examinations, most                 recent 07/15/2023.  Sonographer:    Eva Lash Referring Phys: [id number] SUBRINA SUNDIL IMPRESSIONS  1. Left ventricular ejection fraction, by estimation, is 55 to 60%. The left ventricle has normal function. The left ventricle has no regional wall motion abnormalities. Left ventricular diastolic parameters are consistent with Grade I diastolic dysfunction (impaired relaxation).  2. Right ventricular systolic function is normal. The right ventricular size is normal.  3. The mitral valve is normal in structure. Trivial mitral valve regurgitation. No evidence of mitral stenosis.  4. The aortic valve is calcified. Aortic valve regurgitation is mild to moderate. No aortic stenosis is present. Aortic valve mean gradient measures 7.0 mmHg. Aortic valve Vmax measures 1.92 m/s.  5. Aortic dilatation noted. There is dilatation of the ascending aorta, measuring 42 mm.  6. The inferior vena cava is normal in size with greater than 50% respiratory variability, suggesting right atrial  pressure of 3 mmHg. FINDINGS  Left Ventricle: Left ventricular ejection fraction, by estimation, is 55 to 60%. The left ventricle has normal function. The left ventricle has no regional wall motion abnormalities. The left ventricular internal cavity size was normal in size. There is  no left ventricular hypertrophy. Left ventricular diastolic parameters are consistent with Grade I diastolic dysfunction (impaired relaxation). Right Ventricle: The right ventricular size is normal. No increase in right ventricular wall thickness. Right ventricular systolic function is normal. Left Atrium: Left atrial size was normal in size. Right Atrium: Right atrial size was normal in size. Pericardium: There is no evidence of pericardial effusion. Mitral Valve: The mitral valve is normal in structure. Trivial mitral valve regurgitation. No evidence of mitral valve stenosis. Tricuspid Valve: The tricuspid valve is normal in structure. Tricuspid valve regurgitation is mild . No evidence of tricuspid stenosis. Aortic Valve: The aortic valve is calcified. Aortic valve regurgitation is mild to moderate. Aortic regurgitation PHT measures 571 msec. No aortic stenosis is present. Aortic valve mean gradient measures 7.0 mmHg. Aortic valve peak gradient measures 14.7  mmHg. Aortic valve area, by VTI measures 3.03 cm. Pulmonic Valve: The pulmonic valve was normal in structure. Pulmonic valve regurgitation is not visualized. No evidence of pulmonic stenosis. Aorta: Aortic dilatation noted. There is dilatation of the ascending aorta, measuring 42 mm. Venous: The inferior vena cava is normal in size with greater than 50% respiratory variability, suggesting right atrial pressure of 3 mmHg. IAS/Shunts: No atrial level shunt detected by color flow Doppler.  LEFT  VENTRICLE PLAX 2D LVIDd:         5.30 cm      Diastology LVIDs:         4.00 cm      LV e' medial:    3.81 cm/s LV PW:         0.90 cm      LV E/e' medial:  15.5 LV IVS:        1.00 cm       LV e' lateral:   5.55 cm/s LVOT diam:     2.60 cm      LV E/e' lateral: 10.6 LV SV:         130 LV SV Index:   75 LVOT Area:     5.31 cm  LV Volumes (MOD) LV vol d, MOD A2C: 117.0 ml LV vol d, MOD A4C: 120.0 ml LV vol s, MOD A2C: 54.3 ml LV vol s, MOD A4C: 63.0 ml LV SV MOD A2C:     62.7 ml LV SV MOD A4C:     120.0 ml LV SV MOD BP:      59.1 ml RIGHT VENTRICLE RV S prime:     8.59 cm/s TAPSE (M-mode): 1.5 cm LEFT ATRIUM             Index LA diam:        4.40 cm 2.53 cm/m LA Vol (A2C):   42.6 ml 24.50 ml/m LA Vol (A4C):   53.5 ml 30.76 ml/m LA Biplane Vol: 51.3 ml 29.50 ml/m  AORTIC VALVE AV Area (Vmax):    2.37 cm AV Area (Vmean):   2.21 cm AV Area (VTI):     3.03 cm AV Vmax:           192.00 cm/s AV Vmean:          122.000 cm/s AV VTI:            0.429 m AV Peak Grad:      14.7 mmHg AV Mean Grad:      7.0 mmHg LVOT Vmax:         85.80 cm/s LVOT Vmean:        50.800 cm/s LVOT VTI:          0.245 m LVOT/AV VTI ratio: 0.57 AI PHT:            571 msec  AORTA Ao Asc diam: 4.20 cm MITRAL VALVE                TRICUSPID VALVE MV Area (PHT): 2.28 cm     TR Peak grad:   30.5 mmHg MV Decel Time: 333 msec     TR Vmax:        276.00 cm/s MV E velocity: 58.90 cm/s MV A velocity: 127.00 cm/s  SHUNTS MV E/A ratio:  0.46         Systemic VTI:  0.24 m                             Systemic Diam: 2.60 cm Aditya Sabharwal Electronically signed by Ria Commander Signature Date/Time: 10/29/2023/4:41:51 PM    Final    CT HEAD CODE STROKE WO CONTRAST Result Date: 10/28/2023 CLINICAL DATA:  Code stroke. EXAM: CT HEAD WITHOUT CONTRAST TECHNIQUE: Contiguous axial images were obtained from the base of the skull through the vertex without intravenous contrast. RADIATION DOSE REDUCTION: This exam was performed according to the departmental dose-optimization program which  includes automated exposure control, adjustment of the mA and/or kV according to patient size and/or use of iterative reconstruction technique. COMPARISON:  Prior  study from 05/03/2020 FINDINGS: Brain: Cerebral volume within normal limits for age. Mild chronic microvascular ischemic disease. No acute intracranial hemorrhage. No acute large vessel territory infarct. No mass lesion or midline shift. Mild ventricular prominence related global parenchymal volume loss of hydrocephalus. No extra-axial fluid collection. Vascular: No abnormal hyperdense vessel. Calcified atherosclerosis present at the skull base. Skull: Scalp soft tissues within normal limits.  Calvarium intact. Sinuses/Orbits: Globes and orbital soft tissues within normal limits. Paranasal sinuses and mastoid air cells are largely clear. Other: None. ASPECTS [facility] Stroke Program Early CT Score) - Ganglionic level infarction (caudate, lentiform nuclei, internal capsule, insula, M1-M3 cortex): 7 - Supraganglionic infarction (M4-M6 cortex): 3 Total score (0-10 with 10 being normal): 10 IMPRESSION: 1. No acute intracranial abnormality. 2. ASPECTS is 10. 3. Mild chronic microvascular ischemic disease for age. These results were communicated to Dr. Jerrie at 11:07 pm on 10/28/2023 by text page via the [facility] messaging system. Electronically Signed   By: Morene Hoard M.D.   On: 10/28/2023 23:09    Disposition Pt is being discharged home today in good condition.  Follow-up Plans & Appointments  Follow-up Information     [name], NP Follow up on 12/04/2023.   Specialties: Cardiology, Radiology Contact information: [address] [place] [address] [phone]                Discharge Instructions     AMB Referral to Cardiac Rehabilitation - Phase II   Complete by: As directed    Diagnosis:  Coronary Stents Stable Angina     After initial evaluation and assessments completed: Virtual Based Care may be provided alone or in conjunction with Phase 2 Cardiac Rehab based on patient barriers.: Yes   Intensive Cardiac Rehabilitation (ICR) [facility] location only OR  Traditional Cardiac Rehabilitation (TCR) *If criteria for ICR are not met will enroll in TCR ([facility] only): Yes   Ambulatory referral to Physical Therapy   Complete by: As directed        Discharge Medications Allergies as of 11/11/2023       Reactions   Other Other (See Comments)   Vicryl sutures - patient states that site becomes soupy   Baclofen    Severe delirium    Iodinated Contrast Media Hives   Iodine Hives, Other (See Comments)   IVP contrast   Penicillins Itching, Rash, Other (See Comments)   ITCHY FEELING IN FINGERS; Tolerated Zosyn  04/2023        Medication List     STOP taking these medications    meclizine  12.5 MG tablet Commonly known as: ANTIVERT    omeprazole 20 MG capsule Commonly known as: PRILOSEC Replaced by: pantoprazole  40 MG tablet       TAKE these medications    2-3CC SYRINGE 3 ML Misc 1 mg by Does not apply route every 14 (fourteen) days.   acetaminophen  500 MG tablet Commonly known as: TYLENOL  Take 500-1,000 mg by mouth every 6 (six) hours as needed for mild pain (pain score 1-3) or fever.   apixaban  2.5 MG Tabs tablet Commonly known as: Eliquis  Take 1 tablet (2.5 mg total) by mouth 2 (two) times daily. Start 7/8 PM dose What changed: additional instructions   Artificial Tears ophthalmic solution Place 1 drop into both eyes in the morning, at noon, in the evening, and at bedtime.  aspirin  81 MG chewable tablet Chew 1 tablet (81 mg total) by mouth daily. Take ASA for 7 days Start taking on: November 12, 2023   atorvastatin  80 MG tablet Commonly known as: LIPITOR  Take 1 tablet (80 mg total) by mouth daily.   BD Disp Needles 18G X 1-1/2 Misc Generic drug: NEEDLE (DISP) 18 G 1 mg by Does not apply route every 14 (fourteen) days.   BD Disp Needles 21G X 1-1/2 Misc Generic drug: NEEDLE (DISP) 21 G 1 mg by Does not apply route every 14 (fourteen) days.   buPROPion  150 MG 24 hr tablet Commonly known as: WELLBUTRIN  XL Take 150 mg  by mouth daily.   calcium  carbonate 500 MG chewable tablet Commonly known as: TUMS - dosed in mg elemental calcium  Chew 1-2 tablets by mouth daily as needed for indigestion.   clopidogrel  75 MG tablet Commonly known as: PLAVIX  Take 1 tablet (75 mg total) by mouth daily with breakfast. Start taking on: November 12, 2023   CoQ10 100 MG Caps Take 100 mg by mouth daily.   diphenhydrAMINE  50 MG tablet Commonly known as: BENADRYL  Take 1 tablet (50 mg total) by mouth once for 1 dose. 1 hour prior to test   docusate sodium  100 MG capsule Commonly known as: COLACE Take 100 mg by mouth daily as needed for mild constipation.   dofetilide  125 MCG capsule Commonly known as: TIKOSYN  TAKE 1 CAPSULE BY MOUTH TWICE DAILY   doxycycline  100 MG tablet Commonly known as: VIBRA -TABS Take 100 mg by mouth daily.   DULoxetine  20 MG capsule Commonly known as: CYMBALTA  Take 20 mg by mouth 2 (two) times daily.   febuxostat  40 MG tablet Commonly known as: ULORIC  Take 1 tablet (40 mg total) by mouth daily.   isosorbide  mononitrate 30 MG 24 hr tablet Commonly known as: IMDUR  Take 0.5 tablets (15 mg total) by mouth daily.   ketoconazole  2 % cream Commonly known as: NIZORAL  Apply to the feet every night to prevent fungal infection.   metoprolol  succinate 25 MG 24 hr tablet Commonly known as: TOPROL -XL Take 2 tablets (50 mg total) by mouth at bedtime. Take with or immediately following a meal. What changed:  medication strength how much to take how to take this when to take this additional instructions   metroNIDAZOLE  500 MG tablet Commonly known as: FLAGYL  TAKE 1 TABLET BY MOUTH TWICE DAILY ON SUNDAY AND MONDAY.   nitroGLYCERIN  0.4 MG SL tablet Commonly known as: NITROSTAT  Place 1 tablet (0.4 mg total) under the tongue every 5 (five) minutes as needed for chest pain.   pantoprazole  40 MG tablet Commonly known as: PROTONIX  Take 1 tablet (40 mg total) by mouth daily. Replaces: omeprazole  20 MG capsule   predniSONE  50 MG tablet Commonly known as: DELTASONE  Take 1 tablet by mouth 13 hrs prior to test, another tablet 7 hours prior, and last dose 1 hour prior.   sacubitril -valsartan  24-26 MG Commonly known as: ENTRESTO  Take 1 tablet by mouth 2 (two) times daily.   terbinafine  250 MG tablet Commonly known as: LAMISIL  Take 1 tablet (250 mg total) by mouth daily.   Testosterone  1.62 % Gel APPLY 2 PUMPS DAILY   testosterone  cypionate 200 MG/ML injection Commonly known as: DEPOTESTOSTERONE CYPIONATE INJECT 1 ML (200 MG TOTAL) INTO THE MUSCLE EVERY 28 DAYS         Outstanding Labs/Studies  CBC at follow-up  Duration of Discharge Encounter: APP Time: 60 minutes   Signed, Casee Knepp VEAR Fishman,  PA-C 11/11/2023, 4:32 PM

## 2023-11-11 NOTE — Progress Notes (Signed)
 Notified Dr. Darron per epic chat. EKG performed per protocol. Advised to hold Tikosyn  and Entresto . Patient made aware and verbalized understanding.

## 2023-11-11 NOTE — Plan of Care (Signed)
  Problem: Education: Goal: Knowledge of secondary prevention will improve (MUST DOCUMENT ALL) Outcome: Progressing   Problem: Education: Goal: Knowledge of patient specific risk factors will improve (DELETE if not current risk factor) Outcome: Progressing   Problem: Activity: Goal: Risk for activity intolerance will decrease Outcome: Progressing   Problem: Pain Managment: Goal: General experience of comfort will improve and/or be controlled Outcome: Progressing   Problem: Safety: Goal: Ability to remain free from injury will improve Outcome: Progressing   Problem: Clinical Measurements: Goal: Respiratory complications will improve Outcome: Progressing

## 2023-11-11 NOTE — Progress Notes (Signed)
 ? ?  Inpatient Rehab Admissions Coordinator : ? ?Per therapy recommendations patient was screened for CIR candidacy by Ottie Glazier RN MSN. Patient does not appear to demonstrate the medical neccesity for a Hospital Rehabilitation /CIR admit. I will not place a Rehab Consult. Recommend other Rehab Venues to be pursued. Please contact me with any questions. ? ?Ottie Glazier RN MSN ?Admissions Coordinator ?[phone]  ?

## 2023-11-16 ENCOUNTER — Encounter: Payer: Self-pay | Admitting: Cardiovascular Disease

## 2023-11-16 DIAGNOSIS — I639 Cerebral infarction, unspecified: Secondary | ICD-10-CM

## 2023-11-17 ENCOUNTER — Encounter: Payer: Self-pay | Admitting: *Deleted

## 2023-11-17 ENCOUNTER — Ambulatory Visit

## 2023-11-17 DIAGNOSIS — I5022 Chronic systolic (congestive) heart failure: Secondary | ICD-10-CM

## 2023-11-17 NOTE — Progress Notes (Signed)
 Cardiac Individual Treatment Plan  Patient Details  Name: Thomas [name] MRN: [id number] Date of Birth: 01/19/1933 Referring Provider:   Flowsheet Row Cardiac Rehab from 05/19/2023 in [facility] Cardiac and Pulmonary Rehab  Referring Provider Dr. [name]    Initial Encounter Date:  Flowsheet Row Cardiac Rehab from 05/19/2023 in [facility] Cardiac and Pulmonary Rehab  Date 05/19/23    Visit Diagnosis: Heart failure, chronic systolic (HCC)  Patient's Home Medications on Admission:  Current Outpatient Medications:    acetaminophen  (TYLENOL ) 500 MG tablet, Take 500-1,000 mg by mouth every 6 (six) hours as needed for mild pain (pain score 1-3) or fever., Disp: , Rfl:    apixaban  (ELIQUIS ) 2.5 MG TABS tablet, Take 1 tablet (2.5 mg total) by mouth 2 (two) times daily. Start 7/8 PM dose, Disp: 90 tablet, Rfl: 3   Artificial Tears ophthalmic solution, Place 1 drop into both eyes in the morning, at noon, in the evening, and at bedtime., Disp: , Rfl:    aspirin  81 MG chewable tablet, Chew 1 tablet (81 mg total) by mouth daily. Take ASA for 7 days, Disp: 30 tablet, Rfl: 0   atorvastatin  (LIPITOR ) 80 MG tablet, Take 1 tablet (80 mg total) by mouth daily., Disp: 30 tablet, Rfl: 1   buPROPion  (WELLBUTRIN  XL) 150 MG 24 hr tablet, Take 150 mg by mouth daily., Disp: , Rfl:    calcium  carbonate (TUMS - DOSED IN MG ELEMENTAL CALCIUM ) 500 MG chewable tablet, Chew 1-2 tablets by mouth daily as needed for indigestion., Disp: , Rfl:    clopidogrel  (PLAVIX ) 75 MG tablet, Take 1 tablet (75 mg total) by mouth daily with breakfast., Disp: 90 tablet, Rfl: 3   Coenzyme Q10 (COQ10) 100 MG CAPS, Take 100 mg by mouth daily., Disp: , Rfl:    diphenhydrAMINE  (BENADRYL ) 50 MG tablet, Take 1 tablet (50 mg total) by mouth once for 1 dose. 1 hour prior to test, Disp: 1 tablet, Rfl: 0   docusate sodium  (COLACE) 100 MG capsule, Take 100 mg by mouth daily as needed for mild constipation., Disp: , Rfl:    dofetilide  (TIKOSYN ) 125 MCG  capsule, TAKE 1 CAPSULE BY MOUTH TWICE DAILY, Disp: 180 capsule, Rfl: 2   doxycycline  (VIBRA -TABS) 100 MG tablet, Take 100 mg by mouth daily., Disp: , Rfl:    DULoxetine  (CYMBALTA ) 20 MG capsule, Take 20 mg by mouth 2 (two) times daily., Disp: , Rfl:    febuxostat  (ULORIC ) 40 MG tablet, Take 1 tablet (40 mg total) by mouth daily., Disp: 90 tablet, Rfl: 3   isosorbide  mononitrate (IMDUR ) 30 MG 24 hr tablet, Take 0.5 tablets (15 mg total) by mouth daily., Disp: 30 tablet, Rfl: 3   ketoconazole  (NIZORAL ) 2 % cream, Apply to the feet every night to prevent fungal infection., Disp: 60 g, Rfl: 11   metoprolol  succinate (TOPROL -XL) 25 MG 24 hr tablet, Take 2 tablets (50 mg total) by mouth at bedtime. Take with or immediately following a meal., Disp: 90 tablet, Rfl: 3   metroNIDAZOLE  (FLAGYL ) 500 MG tablet, TAKE 1 TABLET BY MOUTH TWICE DAILY ON SUNDAY AND MONDAY., Disp: 90 tablet, Rfl: 3   NEEDLE, DISP, 18 G (BD DISP NEEDLES) 18G X 1-1/2 MISC, 1 mg by Does not apply route every 14 (fourteen) days., Disp: 50 each, Rfl: 0   NEEDLE, DISP, 21 G (BD DISP NEEDLES) 21G X 1-1/2 MISC, 1 mg by Does not apply route every 14 (fourteen) days., Disp: 50 each, Rfl: 0   nitroGLYCERIN  (NITROSTAT ) 0.4  MG SL tablet, Place 1 tablet (0.4 mg total) under the tongue every 5 (five) minutes as needed for chest pain., Disp: 25 tablet, Rfl: 3   pantoprazole  (PROTONIX ) 40 MG tablet, Take 1 tablet (40 mg total) by mouth daily., Disp: 90 tablet, Rfl: 3   predniSONE  (DELTASONE ) 50 MG tablet, Take 1 tablet by mouth 13 hrs prior to test, another tablet 7 hours prior, and last dose 1 hour prior., Disp: 3 tablet, Rfl: 0   sacubitril -valsartan  (ENTRESTO ) 24-26 MG, Take 1 tablet by mouth 2 (two) times daily., Disp: 180 tablet, Rfl: 3   Syringe, Disposable, (2-3CC SYRINGE) 3 ML MISC, 1 mg by Does not apply route every 14 (fourteen) days., Disp: 25 each, Rfl: 3   terbinafine  (LAMISIL ) 250 MG tablet, Take 1 tablet (250 mg total) by mouth daily.,  Disp: 30 tablet, Rfl: 0   Testosterone  1.62 % GEL, APPLY 2 PUMPS DAILY, Disp: 75 g, Rfl: 3   testosterone  cypionate (DEPOTESTOSTERONE CYPIONATE) 200 MG/ML injection, INJECT 1 ML (200 MG TOTAL) INTO THE MUSCLE EVERY 28 DAYS, Disp: 10 mL, Rfl: 0  Past Medical History: Past Medical History:  Diagnosis Date   Aortic insufficiency    a. 07/2023 Echo: Mod AI; b. 09/2023 Echo: Mild-mod AI.   Aortic valve disorders    Arthritis    Basal cell carcinoma    face, nose left shoulder, left arm (06/19/2016)   Basal cell carcinoma 09/13/2020   right temple   BBB (bundle branch block)    hx right   CAD (coronary artery disease)    a. 10/2017 Cath: mod, nonobs dzs; b. 02/2021 Cath: mod, nonobs dzs; c. 11/2022 Cath: LAD 30ost/p, 81m, D1 min irregs, LCX 65p/m, OM1 min irregs, OM2 40, OM3 nl, RCA 30p, 36m, 40d w/ 40 in side branch-->Med rx.   Chronic back pain    neck, thoracic, lower back (06/19/2016)   CKD (chronic kidney disease), stage III (HCC)    Complete heart block (HCC) 06/2016   a. 06/2016 s/p MDT PPM; b. 01/2023 s/p upgrade to MDT CRT-P (ser # [id number]).   GERD (gastroesophageal reflux disease)    Gout    Heart block    I've had type I, II Wenke before now (06/19/2016)   History of gout    History of hiatal hernia    self dx'd (06/19/2016)   Hyperlipidemia    Hypertension    Lyme disease    dx'd by me 2003; cx's showed dx 08/2015   Migraine    3-4/year (06/19/2016)   Mitral regurgitation    a. 07/2023 Echo: Mod MR.   NICM (nonischemic cardiomyopathy - in setting of RV pacing) (HCC)    a. 02/2019 Echo: EF 45-50%; b. 10/2022 Echo: EF 30-35%; c. 01/2023 CRT-P upgrade; e. 07/2023 Echo: EF 40-45%, glob HK; f. 10/2023 Echo: EF of 55 to 60% with grade 1 DD, nl RV fxn, triv MR, mild to mod AI, and mildly dilated ascending aorta at 42 mm.   PAF (paroxysmal atrial fibrillation) (HCC)    a. CHA2DS2VASc = 7--> eliquis ; b. On tikosyn .   Paroxysmal atrial flutter (HCC)    Presence of permanent  cardiac pacemaker    PVC's (premature ventricular contractions)    Renal cancer, left (HCC) 2006   S/P cryotherapy   Spinal stenosis    cervical, 1 thoracic, lumbar (06/19/2016)   Squamous carcinoma    face, nose left shoulder, left arm (06/19/2016)   Stroke (HCC)    TIA (transient ischemic attack) 06/14/2016  I'm not sure that's what it was (06/25/2016)   Visit for monitoring Tikosyn  therapy 09/09/2017    Tobacco Use: Social History   Tobacco Use  Smoking Status Never  Smokeless Tobacco Never    Labs: Review Flowsheet  More data exists      Latest Ref Rng & Units 01/09/2022 11/25/2022 10/28/2023 10/29/2023 11/11/2023  Labs for ITP Cardiac and Pulmonary Rehab  Cholestrol 0 - 200 mg/dL 866  - - 851  882   LDL (calc) 0 - 99 mg/dL 56  - - 91  56   HDL-C >40 mg/dL 67  - - 45  55   Trlycerides <150 mg/dL 39  - - 59  30   Hemoglobin A1c 4.8 - 5.6 % 5.7  - 6.2  - -  PH, Arterial 7.35 - 7.45 - 7.376  - - -  PCO2 arterial 32 - 48 mmHg - 41.7  - - -  Bicarbonate 20.0 - 28.0 mmol/L - 25.5  24.4  - - -  TCO2 22 - 32 mmol/L - 27  26  25   - -  Acid-base deficit 0.0 - 2.0 mmol/L - 1.0  - - -  O2 Saturation % - 70  88  - - -    Details       Multiple values from one day are sorted in reverse-chronological order          Exercise Target Goals: Exercise Program Goal: Individual exercise prescription set using results from initial 6 min walk test and THRR while considering  patient's activity barriers and safety.   Exercise Prescription Goal: Initial exercise prescription builds to 30-45 minutes a day of aerobic activity, 2-3 days per week.  Home exercise guidelines will be given to patient during program as part of exercise prescription that the participant will acknowledge.   Education: Aerobic Exercise: - Group verbal and visual presentation on the components of exercise prescription. Introduces F.I.T.T principle from ACSM for exercise prescriptions.  Reviews F.I.T.T.  principles of aerobic exercise including progression. Written material given at graduation.   Education: Resistance Exercise: - Group verbal and visual presentation on the components of exercise prescription. Introduces F.I.T.T principle from ACSM for exercise prescriptions  Reviews F.I.T.T. principles of resistance exercise including progression. Written material given at graduation. Flowsheet Row Cardiac Rehab from 07/30/2023 in [facility] Cardiac and Pulmonary Rehab  Date 07/16/23  Educator [facility]  Instruction Review Code 1- [employer] Understanding     Education: Exercise & Equipment Safety: - Individual verbal instruction and demonstration of equipment use and safety with use of the equipment. Flowsheet Row Cardiac Rehab from 07/30/2023 in [facility] Cardiac and Pulmonary Rehab  Date 05/19/23  Educator [facility]  Instruction Review Code 1- Verbalizes Understanding    Education: Exercise Physiology & General Exercise Guidelines: - Group verbal and written instruction with models to review the exercise physiology of the cardiovascular system and associated critical values. Provides general exercise guidelines with specific guidelines to those with heart or lung disease.    Education: Flexibility, Balance, Mind/Body Relaxation: - Group verbal and visual presentation with interactive activity on the components of exercise prescription. Introduces F.I.T.T principle from ACSM for exercise prescriptions. Reviews F.I.T.T. principles of flexibility and balance exercise training including progression. Also discusses the mind body connection.  Reviews various relaxation techniques to help reduce and manage stress (i.e. Deep breathing, progressive muscle relaxation, and visualization). Balance handout provided to take home. Written material given at graduation. Flowsheet Row Cardiac Rehab from 07/30/2023 in [facility] Cardiac and  Pulmonary Rehab  Date 07/16/23  Educator [facility]  Instruction Review Code 1- Verbalizes Understanding     Activity Barriers & Risk Stratification:   6 Minute Walk:  6 Minute Walk     Row Name 08/07/23 1401         6 Minute Walk   Phase Discharge     Distance 1390 feet     Distance % Change 2.2 %     Distance Feet Change 30 ft     Walk Time 6 minutes     # of Rest Breaks 0     MPH 2.63     METS 2.16     RPE 11     Perceived Dyspnea  0     VO2 Peak 7.57     Symptoms No     Resting HR 63 bpm     Resting BP 128/66     Resting Oxygen Saturation  94 %     Exercise Oxygen Saturation  during 6 min walk 89 %     Max Ex. HR 97 bpm     Max Ex. BP 138/72        Oxygen Initial Assessment:   Oxygen Re-Evaluation:   Oxygen Discharge (Final Oxygen Re-Evaluation):   Initial Exercise Prescription:   Perform Capillary Blood Glucose checks as needed.  Exercise Prescription Changes:   Exercise Prescription Changes     Row Name 06/02/23 1500 06/17/23 1500 06/30/23 1400 07/02/23 0800 07/17/23 1700     Response to Exercise   Blood Pressure (Admit) 106/60 136/66 -- 152/80 120/78   Blood Pressure (Exercise) 122/56 155/66 -- 152/74 134/64   Blood Pressure (Exit) 126/60 126/64 -- 130/66 110/70   Heart Rate (Admit) 89 bpm 82 bpm -- 50 bpm 59 bpm   Heart Rate (Exercise) 117 bpm 123 bpm -- 128 bpm 121 bpm   Heart Rate (Exit) 67 bpm 70 bpm -- 64 bpm 73 bpm   Rating of Perceived Exertion (Exercise) 13 13 -- 13 13   Perceived Dyspnea (Exercise) -- 0 -- 0 --   Symptoms none none -- none none   Comments first 2 exercise sessions -- -- -- --   Duration Progress to 30 minutes of  aerobic without signs/symptoms of physical distress Progress to 30 minutes of  aerobic without signs/symptoms of physical distress -- Progress to 30 minutes of  aerobic without signs/symptoms of physical distress Continue with 30 min of aerobic exercise without signs/symptoms of physical distress.   Intensity THRR unchanged THRR unchanged -- THRR unchanged THRR unchanged     Progression   Progression Continue  to progress workloads to maintain intensity without signs/symptoms of physical distress. Continue to progress workloads to maintain intensity without signs/symptoms of physical distress. -- Continue to progress workloads to maintain intensity without signs/symptoms of physical distress. Continue to progress workloads to maintain intensity without signs/symptoms of physical distress.   Average METs 2.38 2.8 -- 3.45 3.28     Resistance Training   Training Prescription Yes Yes -- Yes Yes   Weight 7 7 -- 7 7   Reps 10-15 10-15 -- 10-15 10-15     Interval Training   Interval Training -- No -- No No     Oxygen   Oxygen Continuous -- -- -- --     Treadmill   MPH 2.2 2.2 -- 3 3.2   Grade 1 0 -- 0.5 0.5   Minutes 15 15 -- 15 15   METs 2.99 -- -- 3.5 3.67  NuStep   Level 3 -- -- -- 3   SPM 80 -- -- -- --   Minutes 15 -- -- -- 15   METs 2 -- -- -- 2.6     REL-XR   Level -- 3 -- 4 4   Minutes -- 15 -- 15 15   METs -- 3.2 -- 3.9 4.1     T5 Nustep   Level 1 -- -- -- 4   SPM 80 -- -- -- --   Minutes 15 -- -- -- 15   METs 2 -- -- -- 2.8     Home Exercise Plan   Plans to continue exercise at -- -- [employer] [facility]  Pt plans to join [employer] for aerobic equipment, Walking dog around 1 mile a day [employer]  Pt plans to join [employer] for aerobic equipment, Walking dog around 1 mile a day [employer]  Pt plans to join [employer] for aerobic equipment, Walking dog around 1 mile a day   Frequency -- -- Add 2 additional days to program exercise sessions. Add 2 additional days to program exercise sessions. Add 2 additional days to program exercise sessions.   Initial Home Exercises Provided -- -- 06/30/23 06/30/23 06/30/23     Oxygen   Maintain Oxygen Saturation 88% or higher 88% or higher 88% or higher 88% or higher --    Row Name 07/28/23 1400 08/14/23 0800 08/28/23 1500 11/05/23 0900       Response to Exercise   Blood Pressure (Admit) 122/64 128/66 136/70 128/62     Blood Pressure (Exercise) -- 138/72 -- 128/74    Blood Pressure (Exit) 124/66 124/68 108/62 122/82    Heart Rate (Admit) 66 bpm 63 bpm 60 bpm 72 bpm    Heart Rate (Exercise) 120 bpm 122 bpm 98 bpm 90 bpm    Heart Rate (Exit) 68 bpm 66 bpm 64 bpm 78 bpm    Oxygen Saturation (Admit) -- 94 % -- --    Oxygen Saturation (Exercise) -- 89 % -- --    Oxygen Saturation (Exit) -- 89 % -- --    Rating of Perceived Exertion (Exercise) 13 13 13 13     Perceived Dyspnea (Exercise) -- 0 -- --    Symptoms none none none none    Duration Continue with 30 min of aerobic exercise without signs/symptoms of physical distress. Continue with 30 min of aerobic exercise without signs/symptoms of physical distress. Continue with 30 min of aerobic exercise without signs/symptoms of physical distress. Continue with 30 min of aerobic exercise without signs/symptoms of physical distress.    Intensity THRR unchanged THRR unchanged THRR unchanged THRR unchanged      Progression   Progression Continue to progress workloads to maintain intensity without signs/symptoms of physical distress. Continue to progress workloads to maintain intensity without signs/symptoms of physical distress. Continue to progress workloads to maintain intensity without signs/symptoms of physical distress. Continue to progress workloads to maintain intensity without signs/symptoms of physical distress.    Average METs 3.62 3.88 4.23 2.81      Resistance Training   Training Prescription Yes Yes Yes Yes    Weight 8 8 8  lb 8 lb    Reps 10-15 10-15 10-15 10-15      Interval Training   Interval Training No No No No      Treadmill   MPH 3.2 3.4 3.2 2.1    Grade 0.5 0 0 0    Minutes 15 15 15  15  METs 3.67 3.6 3.45 2.61      NuStep   Level -- 4  T6 -- 3    Minutes -- 15 -- 15    METs -- 2.7 -- 3      REL-XR   Level 8 5 9  --    Minutes 15 15 15  --    METs 5.4 6.4 5 --      T5 Nustep   Level 4 -- -- --    Minutes 15 -- -- --    METs 2.5  -- -- --      Home Exercise Plan   Plans to continue exercise at [facility]  Pt plans to join [employer] for aerobic equipment, Walking dog around 1 mile a day [employer]  Pt plans to join [employer] for aerobic equipment, Walking dog around 1 mile a day [employer]  Pt plans to join [employer] for aerobic equipment, Walking dog around 1 mile a day [employer]  Pt plans to join [employer] for aerobic equipment, Walking dog around 1 mile a day    Frequency Add 2 additional days to program exercise sessions. Add 2 additional days to program exercise sessions. Add 2 additional days to program exercise sessions. Add 2 additional days to program exercise sessions.    Initial Home Exercises Provided 06/30/23 06/30/23 06/30/23 06/30/23      Oxygen   Maintain Oxygen Saturation 88% or higher 88% or higher 88% or higher 88% or higher       Exercise Comments:   Exercise Comments     Row Name 05/21/23 1505           Exercise Comments First full day of exercise!  Patient was oriented to gym and equipment including functions, settings, policies, and procedures.  Patient's individual exercise prescription and treatment plan were reviewed.  All starting workloads were established based on the results of the 6 minute walk test done at initial orientation visit.  The plan for exercise progression was also introduced and progression will be customized based on patient's performance and goals.          Exercise Goals and Review:   Exercise Goals Re-Evaluation :  Exercise Goals Re-Evaluation     Row Name 05/21/23 1508 06/02/23 1558 06/11/23 1406 06/17/23 1531 06/30/23 1429     Exercise Goal Re-Evaluation   Exercise Goals Review Increase Physical Activity;Able to understand and use rate of perceived exertion (RPE) scale;Knowledge and understanding of Target Heart Rate Range (THRR);Understanding of Exercise Prescription;Increase Strength and Stamina;Able to check pulse  independently Increase Physical Activity;Understanding of Exercise Prescription;Increase Strength and Stamina Increase Physical Activity;Increase Strength and Stamina;Understanding of Exercise Prescription Increase Physical Activity;Increase Strength and Stamina;Understanding of Exercise Prescription Increase Physical Activity;Able to understand and use Dyspnea scale;Understanding of Exercise Prescription;Knowledge and understanding of Target Heart Rate Range (THRR);Increase Strength and Stamina;Able to check pulse independently;Able to understand and use rate of perceived exertion (RPE) scale   Comments Reviewed RPE and dyspnea scale, THR and program prescription with pt today.  Pt voiced understanding and was given a copy of goals to take home. Larnce is off to a good start in the program. He has completed 2 exercise sessions. He did well with his exercise prescription and even increased his treadmill workload to a speed of 2. and incline of 1%. He also added T5 nustep to his exercise prescription. We will continue to monitor his progress in the program. Jameis is doing well at rehab, feels he  is out of shape but is determined to get back to where he was before the surgeries. Currently on treadmill at 1.8 speed. He is happy with the ability to workout safely while being monitored. Encouraged him to continue to workout here at rehab and doing exercise safely when at home. Johnthomas is doing well in rehab. He was able to maintain his speed on the treadmill but decreased his incline from 1% to 0. He was also able to maintain a level of 3 on the XR. We will continue to encourage and monitor his progression in the program. Reviewed home exercise with pt today.  Pt plans to join the [employer] for the aerobic equipment, and also will continue to walk his dog about 1 mile a day for exercise.  Reviewed THR, pulse, RPE, sign and symptoms, pulse oximetery and when to call 911 or MD.  Also discussed weather considerations and  indoor options.  Pt voiced understanding.   Expected Outcomes Short: Use RPE daily to regulate intensity.  Long: Follow program prescription in THR. Short: Continue to follow current exercise prescription. Long: Continue exercise to improve strength and stamina. STG: continue to come to rehab and increase workload as able. LTG: Continue to improve strenght and stamina Short: Continue to follow exercise prescription and increase workloads when able. Long: Continue exercise to improve strength and stamina. Short: Join [employer] for aerobic equipment and exercise. Long: Continue exercise to improve strength and stamina.    Row Name 07/02/23 9177 07/17/23 1742 07/23/23 1414 07/28/23 1447 08/14/23 0844     Exercise Goal Re-Evaluation   Exercise Goals Review Increase Physical Activity;Increase Strength and Stamina;Understanding of Exercise Prescription Increase Physical Activity;Increase Strength and Stamina;Understanding of Exercise Prescription Increase Physical Activity;Increase Strength and Stamina;Understanding of Exercise Prescription Increase Physical Activity;Increase Strength and Stamina;Understanding of Exercise Prescription Increase Physical Activity;Increase Strength and Stamina;Understanding of Exercise Prescription   Comments Stace is doing well in rehab. He was able to increase his treadmill workload to a speed of and an incline of 0.5%. He was also able to increase from level 3 to 4 on the XR. We will continue to monitor his progress in the program. Tanor is doing well in rehab. He was able to increase his treadmill workload to a speed of 3.2 mph and maintain an incline of 0.5%. He was also able to maintain level 4 on the XR and increase to level 4 on the T5 nustep. We will continue to monitor his progress in the program. Monique is walking and doing weight exercises at home fairly consistently. He is consistently getting 3-4 days a week of exercise in each week, including his days in rehab. He was  encouraged to work towards 4 days a week  of exercise consistently. Exercise progression and interval training was discussed with patient. He may start incorporating interval training as he feels ready to progress to higher intensities. THRR was reviewed and patient was encouraged to look into buying a pulse ox to better monitor his heart rate a home while exercising. Bryler continues to do well in rehab. He increased to level 8 on the XR and level 4 on the T5 nustep. He has maintained his workload on the treadmill at a speed of 3.2 mph and incline of 0.5%. He also increased his handweights to 8 lbs for resistance. We will continue to monitor his progress in the program. Mickael continues to do well in rehab and will graduate very soon. He completed his post and walked 1390 ft,  which is a 2.2% improvement from his initial. He increased to level 4 on the T6 nustep. He also increased his speed on the treadmill to 3.4 mph. We will continue to monitor his progress in the program.   Expected Outcomes Short: Continue to increase treadmill workload. Long: Continue exercise to improve strength and stamina. Short: Continue to progressively increase workload on the XR and treadmill. Long: Continue exercise to improve strength and stamina. Short: try interval training in cardiac rehab and purchase a pulse ox to help monitor heart rate during exercise at home. Long: join [employer] after graduation and continue with independent exercise routien. Short: Continue to progressively increase treadmill workload. Long: Continue exercise to improve strength and stamina. Short: Graduate. Long: Continue to exercise independently.    Row Name 08/28/23 1600 09/11/23 1419 10/06/23 1608 10/23/23 0809 11/05/23 0903     Exercise Goal Re-Evaluation   Exercise Goals Review Increase Physical Activity;Increase Strength and Stamina;Understanding of Exercise Prescription Increase Physical Activity;Increase Strength and Stamina;Understanding of  Exercise Prescription -- Increase Physical Activity;Increase Strength and Stamina;Understanding of Exercise Prescription Increase Physical Activity;Increase Strength and Stamina;Understanding of Exercise Prescription   Comments Gifford is doing well in rehab but has only attended one session since the last review. He did improve to level 9 on the XR and continues to walk above 3 mph on the treadmill. We will continue to monitor his progress until he graduates from the program. Griselda has not attended reab since 08/11/2023 and has been on medical hold. He reported that he does want to return and complete the program. We will continue to monitor his progress when he returns. Otha has not attended reab since 08/11/2023 and has been on medical hold. He reported that he does want to return and complete the program. We will continue to monitor his progress when he returns. Edsel has not attended reab since 08/11/2023 and has been on medical hold. He reported that he does want to return and complete the program. We will continue to monitor his progress when he returns. Jayen returned for one session on 10/27/2023. He did well on the treadmill at 2.1 mph with no incline. He also did well at level 3 on the T4 nustep. We will continue to monitor his progress in the program.   Expected Outcomes Short: Return to rehab and graduate. Long: Continue to exercise independently. Short: Return to rehab and graduate. Long: Continue to exercise independently. Short: Return to rehab when appropriate. Long: Graduate. Short: Return to rehab when appropriate. Long: Graduate. Short: Attend rehab more consistently. Long: Continue exercise to improve strength and stamina.      Discharge Exercise Prescription (Final Exercise Prescription Changes):  Exercise Prescription Changes - 11/05/23 0900       Response to Exercise   Blood Pressure (Admit) 128/62    Blood Pressure (Exercise) 128/74    Blood Pressure (Exit) 122/82    Heart Rate  (Admit) 72 bpm    Heart Rate (Exercise) 90 bpm    Heart Rate (Exit) 78 bpm    Rating of Perceived Exertion (Exercise) 13    Symptoms none    Duration Continue with 30 min of aerobic exercise without signs/symptoms of physical distress.    Intensity THRR unchanged      Progression   Progression Continue to progress workloads to maintain intensity without signs/symptoms of physical distress.    Average METs 2.81      Resistance Training   Training Prescription Yes    Weight 8 lb  Reps 10-15      Interval Training   Interval Training No      Treadmill   MPH 2.1    Grade 0    Minutes 15    METs 2.61      NuStep   Level 3    Minutes 15    METs 3      Home Exercise Plan   Plans to continue exercise at [facility]   Pt plans to join [employer] for aerobic equipment, Walking dog around 1 mile a day   Frequency Add 2 additional days to program exercise sessions.    Initial Home Exercises Provided 06/30/23      Oxygen   Maintain Oxygen Saturation 88% or higher          Nutrition:  Target Goals: Understanding of nutrition guidelines, daily intake of sodium 1500mg , cholesterol 200mg , calories 30% from fat and 7% or less from saturated fats, daily to have 5 or more servings of fruits and vegetables.  Education: All About Nutrition: -Group instruction provided by verbal, written material, interactive activities, discussions, models, and posters to present general guidelines for heart healthy nutrition including fat, fiber, MyPlate, the role of sodium in heart healthy nutrition, utilization of the nutrition label, and utilization of this knowledge for meal planning. Follow up email sent as well. Written material given at graduation. Flowsheet Row Cardiac Rehab from 07/30/2023 in [facility] Cardiac and Pulmonary Rehab  Date 07/30/23  Educator JG  Instruction Review Code 1- Verbalizes Understanding    Biometrics:   Post Biometrics - 08/07/23 1402        Post  Biometrics    Height 5' 5.5 (1.664 m)    Weight 157 lb 3.2 oz (71.3 kg)    Waist Circumference 33 inches    Hip Circumference 36 inches    Waist to Hip Ratio 0.92 %    BMI (Calculated) 25.75    Single Leg Stand 6.52 seconds          Nutrition Therapy Plan and Nutrition Goals:   Nutrition Assessments:  MEDIFICTS Score Key: >=70 Need to make dietary changes  40-70 Heart Healthy Diet <= 40 Therapeutic Level Cholesterol Diet  Flowsheet Row Cardiac Rehab from 05/19/2023 in [facility] Cardiac and Pulmonary Rehab  Picture Your Plate Total Score on Admission 75   Picture Your Plate Scores: <59 Unhealthy dietary pattern with much room for improvement. 41-50 Dietary pattern unlikely to meet recommendations for good health and room for improvement. 51-60 More healthful dietary pattern, with some room for improvement.  >60 Healthy dietary pattern, although there may be some specific behaviors that could be improved.    Nutrition Goals Re-Evaluation:  Nutrition Goals Re-Evaluation     Row Name 06/11/23 1417 07/10/23 1423 07/30/23 1354         Goals   Nutrition Goal -- -- Drink 64oz of water daily     Comment Anikin reports his appetite is improving and he is tolerating food better. Remembers discussed with RD about meeting protein goals and limiting sodium and saturated fat. Reviewed some good snack ideas with healthy fats, complex carbs and protein rich foods. Capone states that he is doing well with his diet. He has been drinking plenty of water every day. He is still working on getting plenty of protein in his diet. He is also trying to limit sugars and sodium in his diet. He feels he is overall doing well with the dietary guidelines he discussed with the RD. Norleen  states that he is making an effort to drink more water each day. He feels comfortable reading food labels and does not add salt at the table. He has been watching his sodium intake for many years and reports that he feels he is doing well with  this. He is trying to incorporate more protein into his diet and includes meat, beans, gin pro protein powder.     Expected Outcome STG: continue to eat heart healthy and focus on recovering appetite. LTG: maintain heart healthy lifestyle Short: Continue to work on getting protein and limiting sodium. Long: Continue to practice heart healthy eating patterns discussed with RD. Short: continue to read food lables and make sure he is getting enough protein each day and limiting sodium. Long: continue to follow heart healthy diet.       Personal Goal #2 Re-Evaluation   Personal Goal #2 -- -- Eat 15-20gProtein and 30-60gCarbs at each meal.       Personal Goal #3 Re-Evaluation   Personal Goal #3 -- -- Read labels and reduce sodium intake to below 2300mg . Ideally 1500mg  per day.        Nutrition Goals Discharge (Final Nutrition Goals Re-Evaluation):  Nutrition Goals Re-Evaluation - 07/30/23 1354       Goals   Nutrition Goal Drink 64oz of water daily    Comment Marquiz states that he is making an effort to drink more water each day. He feels comfortable reading food labels and does not add salt at the table. He has been watching his sodium intake for many years and reports that he feels he is doing well with this. He is trying to incorporate more protein into his diet and includes meat, beans, gin pro protein powder.    Expected Outcome Short: continue to read food lables and make sure he is getting enough protein each day and limiting sodium. Long: continue to follow heart healthy diet.      Personal Goal #2 Re-Evaluation   Personal Goal #2 Eat 15-20gProtein and 30-60gCarbs at each meal.      Personal Goal #3 Re-Evaluation   Personal Goal #3 Read labels and reduce sodium intake to below 2300mg . Ideally 1500mg  per day.          Psychosocial: Target Goals: Acknowledge presence or absence of significant depression and/or stress, maximize coping skills, provide positive support system. Participant  is able to verbalize types and ability to use techniques and skills needed for reducing stress and depression.   Education: Stress, Anxiety, and Depression - Group verbal and visual presentation to define topics covered.  Reviews how body is impacted by stress, anxiety, and depression.  Also discusses healthy ways to reduce stress and to treat/manage anxiety and depression.  Written material given at graduation. Flowsheet Row Cardiac Rehab from 07/30/2023 in [facility] Cardiac and Pulmonary Rehab  Date 07/02/23  Educator [facility]  Instruction Review Code 1- [employer] Understanding    Education: Sleep Hygiene -Provides group verbal and written instruction about how sleep can affect your health.  Define sleep hygiene, discuss sleep cycles and impact of sleep habits. Review good sleep hygiene tips.    Initial Review & Psychosocial Screening:   Quality of Life Scores:   Scores of 19 and below usually indicate a poorer quality of life in these areas.  A difference of  2-3 points is a clinically meaningful difference.  A difference of 2-3 points in the total score of the Quality of Life Index has been associated with significant improvement in  overall quality of life, self-image, physical symptoms, and general health in studies assessing change in quality of life.  PHQ-9: Review Flowsheet  More data exists      05/19/2023 01/09/2022 09/26/2021 08/21/2021 10/17/2020  Depression screen PHQ 2/9  Decreased Interest 1 0 0 0 0  Down, Depressed, Hopeless 0 0 0 0 0  PHQ - 2 Score 1 0 0 0 0  Altered sleeping 1 0 0 1 0  Tired, decreased energy 1 1 0 2 1  Change in appetite 1 0 0 0 1  Feeling bad or failure about yourself  0 0 0 0 0  Trouble concentrating 0 0 0 0 0  Moving slowly or fidgety/restless 0 0 0 0 0  Suicidal thoughts 0 0 0 0 0  PHQ-9 Score 4 1 0 3 2  Difficult doing work/chores Somewhat difficult Not difficult at all Not difficult at all Not difficult at all Not difficult at all   Interpretation of  Total Score  Total Score Depression Severity:  1-4 = Minimal depression, 5-9 = Mild depression, 10-14 = Moderate depression, 15-19 = Moderately severe depression, 20-27 = Severe depression   Psychosocial Evaluation and Intervention:   Psychosocial Re-Evaluation:  Psychosocial Re-Evaluation     Row Name 06/11/23 1415 07/10/23 1421 07/30/23 1409         Psychosocial Re-Evaluation   Current issues with None Identified None Identified None Identified     Comments Tod reports a good mindset and is not exeperiencing any stress, anxiety or depression. He says he has a good support system with family. Says he is sleeping well, wakes up well rested. Kamon reportd no major stressors at this time. He enjoys walking and woodworking for stress relief. He has a good support system around him made up primarily of his wife and two daughters. Coolidge reports no issues with sleep at this time. Jhonny reports that he has no concerns with his stress levels or mental health at this time. He states that he has a stable support system. His only concerns is his sleep patterns. He has inconsistent sleep patterns and has been working with his doctor on this. He is in the process of getting set up with a CPAP. He does complain of having brain fog during the day but not really feeling tired. He will continue to work with his doctor to get set up with his CPAP to see if this helps his quality of sleep and day time brain fog.     Expected Outcomes STG: continue to use support system and attend cardiac rehab and education classes. LTG: maintain positive outlook on health and lifestyle changes Short: Continue to exercise for stress relief. Long: Continue to maintain positive outlook. Short: get set up with CPAP machine. Long: get into healhty sleep patterns and maintian this long term.     Interventions Encouraged to attend Cardiac Rehabilitation for the exercise Encouraged to attend Cardiac Rehabilitation for the exercise Encouraged  to attend Cardiac Rehabilitation for the exercise     Continue Psychosocial Services  Follow up required by staff Follow up required by staff Follow up required by staff        Psychosocial Discharge (Final Psychosocial Re-Evaluation):  Psychosocial Re-Evaluation - 07/30/23 1409       Psychosocial Re-Evaluation   Current issues with None Identified    Comments Manny reports that he has no concerns with his stress levels or mental health at this time. He states that he has a stable support  system. His only concerns is his sleep patterns. He has inconsistent sleep patterns and has been working with his doctor on this. He is in the process of getting set up with a CPAP. He does complain of having brain fog during the day but not really feeling tired. He will continue to work with his doctor to get set up with his CPAP to see if this helps his quality of sleep and day time brain fog.    Expected Outcomes Short: get set up with CPAP machine. Long: get into healhty sleep patterns and maintian this long term.    Interventions Encouraged to attend Cardiac Rehabilitation for the exercise    Continue Psychosocial Services  Follow up required by staff          Vocational Rehabilitation: Provide vocational rehab assistance to qualifying candidates.   Vocational Rehab Evaluation & Intervention:   Education: Education Goals: Education classes will be provided on a variety of topics geared toward better understanding of heart health and risk factor modification. Participant will state understanding/return demonstration of topics presented as noted by education test scores.  Learning Barriers/Preferences:   General Cardiac Education Topics:  AED/CPR: - Group verbal and written instruction with the use of models to demonstrate the basic use of the AED with the basic ABC's of resuscitation.   Anatomy and Cardiac Procedures: - Group verbal and visual presentation and models provide information  about basic cardiac anatomy and function. Reviews the testing methods done to diagnose heart disease and the outcomes of the test results. Describes the treatment choices: Medical Management, Angioplasty, or Coronary Bypass Surgery for treating various heart conditions including Myocardial Infarction, Angina, Valve Disease, and Cardiac Arrhythmias.  Written material given at graduation.   Medication Safety: - Group verbal and visual instruction to review commonly prescribed medications for heart and lung disease. Reviews the medication, class of the drug, and side effects. Includes the steps to properly store meds and maintain the prescription regimen.  Written material given at graduation. Flowsheet Row Cardiac Rehab from 07/30/2023 in [facility] Cardiac and Pulmonary Rehab  Date 06/11/23  Educator SB  Instruction Review Code 1- Verbalizes Understanding    Intimacy: - Group verbal instruction through game format to discuss how heart and lung disease can affect sexual intimacy. Written material given at graduation..   Know Your Numbers and Heart Failure: - Group verbal and visual instruction to discuss disease risk factors for cardiac and pulmonary disease and treatment options.  Reviews associated critical values for Overweight/Obesity, Hypertension, Cholesterol, and Diabetes.  Discusses basics of heart failure: signs/symptoms and treatments.  Introduces Heart Failure Zone chart for action plan for heart failure.  Written material given at graduation. Flowsheet Row Cardiac Rehab from 07/30/2023 in [facility] Cardiac and Pulmonary Rehab  Education need identified 05/19/23  Date 06/18/23  Educator SB  Instruction Review Code 1- Verbalizes Understanding    Infection Prevention: - Provides verbal and written material to individual with discussion of infection control including proper hand washing and proper equipment cleaning during exercise session. Flowsheet Row Cardiac Rehab from 07/30/2023 in [facility]  Cardiac and Pulmonary Rehab  Date 05/19/23  Educator [facility]  Instruction Review Code 1- Verbalizes Understanding    Falls Prevention: - Provides verbal and written material to individual with discussion of falls prevention and safety. Flowsheet Row Cardiac Rehab from 07/30/2023 in [facility] Cardiac and Pulmonary Rehab  Date 05/19/23  Educator [facility]  Instruction Review Code 1- Verbalizes Understanding    Other: -Provides group and verbal instruction  on various topics (see comments)   Knowledge Questionnaire Score:   Core Components/Risk Factors/Patient Goals at Admission:   Education:Diabetes - Individual verbal and written instruction to review signs/symptoms of diabetes, desired ranges of glucose level fasting, after meals and with exercise. Acknowledge that pre and post exercise glucose checks will be done for 3 sessions at entry of program.   Core Components/Risk Factors/Patient Goals Review:   Goals and Risk Factor Review     Row Name 06/11/23 1420 07/10/23 1426 07/30/23 1359         Core Components/Risk Factors/Patient Goals Review   Personal Goals Review Weight Management/Obesity Weight Management/Obesity;Hypertension Heart Failure;Hypertension;Lipids     Review He lost a lot of weight after surgery, appetite and taste were gone. This has improved and he reports he has gained back ~5lbs since starting rehab. Commended him on his recovery and reminded him of caloire and nutrient dense foods to add to meals and snacks. He states that his weight has been maintained around 155-160 lb. He states that he is more interested in gaining muscle mass more than he is the number on the scale. He also reports that he checks his BP at home routinely. He also states that his BP readings have been good and within normal ranges when he checks it. Lavi states that he continues to try to gain muscle mass and maintain weight. He takes all his cholesterol meds and follows up with his doctor with lab work to  manage this. He checks his weight and blood pressure at home. He states he is aware of the signs of heart failure and what to look out for. His BP and weight have been steady.     Expected Outcomes STG: increase strength, weight stabilization. LTG: maintain and achieve strong healthy weight Short: Continue to work towards gaining muscle mass. Long: Continue to manage lifestyle risk factors. Short: cotninue to work towards building muscle mass and strength. continue to monitor weight and BP at home. Long: continue to control cardiac risk ractors.        Core Components/Risk Factors/Patient Goals at Discharge (Final Review):   Goals and Risk Factor Review - 07/30/23 1359       Core Components/Risk Factors/Patient Goals Review   Personal Goals Review Heart Failure;Hypertension;Lipids    Review Sharief states that he continues to try to gain muscle mass and maintain weight. He takes all his cholesterol meds and follows up with his doctor with lab work to manage this. He checks his weight and blood pressure at home. He states he is aware of the signs of heart failure and what to look out for. His BP and weight have been steady.    Expected Outcomes Short: cotninue to work towards building muscle mass and strength. continue to monitor weight and BP at home. Long: continue to control cardiac risk ractors.          ITP Comments:  ITP Comments     Row Name 05/21/23 1505 06/18/23 1127 07/16/23 1027 08/13/23 0821 09/10/23 1318   ITP Comments First full day of exercise!  Patient was oriented to gym and equipment including functions, settings, policies, and procedures.  Patient's individual exercise prescription and treatment plan were reviewed.  All starting workloads were established based on the results of the 6 minute walk test done at initial orientation visit.  The plan for exercise progression was also introduced and progression will be customized based on patient's performance and goals. 30 Day review  completed. Medical Director  ITP review done, changes made as directed, and signed approval by Medical Director.    new to program 30 Day review completed. Medical Director ITP review done, changes made as directed, and signed approval by Medical Director. 30 Day review completed. Medical Director ITP review done, changes made as directed, and signed approval by Medical Director. 30 Day review completed. Medical Director ITP review done, changes made as directed, and signed approval by Medical Director.    out medical reason  since 4/7    Row Name 10/08/23 9047 11/05/23 0945 11/17/23 1314       ITP Comments 30 Day review completed. Medical Director ITP review done, changes made as directed, and signed approval by Medical Director.    out since 4/7 30 Day review completed. Medical Director ITP review done, changes made as directed, and signed approval by Medical Director. Mr. Leitzel is being discharged at this time due to new cardiac event. He plans to start cardiac rehab once he is cleared by neurology and completes PT.        Comments: Early Discharge ITP

## 2023-11-19 ENCOUNTER — Ambulatory Visit

## 2023-11-19 ENCOUNTER — Ambulatory Visit: Admitting: Physical Therapy

## 2023-11-20 DIAGNOSIS — R200 Anesthesia of skin: Secondary | ICD-10-CM | POA: Diagnosis not present

## 2023-11-20 DIAGNOSIS — G4733 Obstructive sleep apnea (adult) (pediatric): Secondary | ICD-10-CM | POA: Diagnosis not present

## 2023-11-20 DIAGNOSIS — Z8619 Personal history of other infectious and parasitic diseases: Secondary | ICD-10-CM | POA: Diagnosis not present

## 2023-11-24 DIAGNOSIS — I672 Cerebral atherosclerosis: Secondary | ICD-10-CM | POA: Diagnosis not present

## 2023-11-24 DIAGNOSIS — G43709 Chronic migraine without aura, not intractable, without status migrainosus: Secondary | ICD-10-CM | POA: Diagnosis not present

## 2023-11-24 DIAGNOSIS — Z8673 Personal history of transient ischemic attack (TIA), and cerebral infarction without residual deficits: Secondary | ICD-10-CM | POA: Diagnosis not present

## 2023-11-24 DIAGNOSIS — R569 Unspecified convulsions: Secondary | ICD-10-CM | POA: Diagnosis not present

## 2023-12-04 ENCOUNTER — Ambulatory Visit: Admitting: Nurse Practitioner

## 2023-12-15 ENCOUNTER — Ambulatory Visit

## 2023-12-15 ENCOUNTER — Ambulatory Visit: Attending: Cardiovascular Disease | Admitting: Occupational Therapy

## 2023-12-15 DIAGNOSIS — R293 Abnormal posture: Secondary | ICD-10-CM | POA: Diagnosis not present

## 2023-12-15 DIAGNOSIS — G8929 Other chronic pain: Secondary | ICD-10-CM

## 2023-12-15 DIAGNOSIS — M533 Sacrococcygeal disorders, not elsewhere classified: Secondary | ICD-10-CM | POA: Insufficient documentation

## 2023-12-15 DIAGNOSIS — R2689 Other abnormalities of gait and mobility: Secondary | ICD-10-CM

## 2023-12-15 DIAGNOSIS — M6281 Muscle weakness (generalized): Secondary | ICD-10-CM | POA: Diagnosis not present

## 2023-12-15 DIAGNOSIS — M545 Low back pain, unspecified: Secondary | ICD-10-CM

## 2023-12-15 DIAGNOSIS — R278 Other lack of coordination: Secondary | ICD-10-CM | POA: Insufficient documentation

## 2023-12-15 NOTE — Evaluation (Signed)
 OUTPATIENT PHYSICAL THERAPY NEURO EVALUATION   Patient Name: Thomas [name] MRN: [id number] DOB:1932-11-17, 89 y.o., male Today's Date: 12/15/2023   PCP: [name], MD  REFERRING PROVIDER: Darron Deatrice LABOR, MD   END OF SESSION:   Past Medical History:  Diagnosis Date   Aortic insufficiency    a. 07/2023 Echo: Mod AI; b. 09/2023 Echo: Mild-mod AI.   Aortic valve disorders    Arthritis    Basal cell carcinoma    face, nose left shoulder, left arm (06/19/2016)   Basal cell carcinoma 09/13/2020   right temple   BBB (bundle branch block)    hx right   CAD (coronary artery disease)    a. 10/2017 Cath: mod, nonobs dzs; b. 02/2021 Cath: mod, nonobs dzs; c. 11/2022 Cath: LAD 30ost/p, 33m, D1 min irregs, LCX 65p/m, OM1 min irregs, OM2 40, OM3 nl, RCA 30p, 98m, 40d w/ 40 in side branch-->Med rx.   Chronic back pain    neck, thoracic, lower back (06/19/2016)   CKD (chronic kidney disease), stage III (HCC)    Complete heart block (HCC) 06/2016   a. 06/2016 s/p MDT PPM; b. 01/2023 s/p upgrade to MDT CRT-P (ser # [id number]).   GERD (gastroesophageal reflux disease)    Gout    Heart block    I've had type I, II Wenke before now (06/19/2016)   History of gout    History of hiatal hernia    self dx'd (06/19/2016)   Hyperlipidemia    Hypertension    Lyme disease    dx'd by me 2003; cx's showed dx 08/2015   Migraine    3-4/year (06/19/2016)   Mitral regurgitation    a. 07/2023 Echo: Mod MR.   NICM (nonischemic cardiomyopathy - in setting of RV pacing) (HCC)    a. 02/2019 Echo: EF 45-50%; b. 10/2022 Echo: EF 30-35%; c. 01/2023 CRT-P upgrade; e. 07/2023 Echo: EF 40-45%, glob HK; f. 10/2023 Echo: EF of 55 to 60% with grade 1 DD, nl RV fxn, triv MR, mild to mod AI, and mildly dilated ascending aorta at 42 mm.   PAF (paroxysmal atrial fibrillation) (HCC)    a. CHA2DS2VASc = 7--> eliquis ; b. On tikosyn .   Paroxysmal atrial flutter (HCC)    Presence of permanent cardiac pacemaker     PVC's (premature ventricular contractions)    Renal cancer, left (HCC) 2006   S/P cryotherapy   Spinal stenosis    cervical, 1 thoracic, lumbar (06/19/2016)   Squamous carcinoma    face, nose left shoulder, left arm (06/19/2016)   Stroke (HCC)    TIA (transient ischemic attack) 06/14/2016   I'm not sure that's what it was (06/25/2016)   Visit for monitoring Tikosyn  therapy 09/09/2017   Past Surgical History:  Procedure Laterality Date   ANKLE FRACTURE SURGERY Right 1967   BACK SURGERY  05/07/2020   BASAL CELL CARCINOMA EXCISION     face, nose left shoulder, left arm   BIOPSY PROSTATE  2001 & 2003   BIV UPGRADE N/A 01/10/2023   Procedure: BIV UPGRADE;  Surgeon: Fernande Elspeth BROCKS, MD;  Location: [facility] INVASIVE [facility];  Service: Cardiovascular;  Laterality: N/A;   CARDIAC CATHETERIZATION  1990's   CARDIOVERSION N/A 09/11/2017   Procedure: CARDIOVERSION;  Surgeon: Pietro Redell GORMAN, MD;  Location: [facility] ENDOSCOPY;  Service: Cardiovascular;  Laterality: N/A;   CORONARY STENT INTERVENTION N/A 11/10/2023   Procedure: CORONARY STENT INTERVENTION;  Surgeon: Darron Deatrice LABOR, MD;  Location: [facility] INVASIVE CV LAB;  Service: Cardiovascular;  Laterality: N/A;   FRACTURE SURGERY     HOLEP-LASER ENUCLEATION OF THE PROSTATE WITH MORCELLATION N/A 07/10/2020   Procedure: HOLEP-LASER ENUCLEATION OF THE PROSTATE WITH MORCELLATION;  Surgeon: Penne Knee, MD;  Location: [facility] ORS;  Service: Urology;  Laterality: N/A;   INGUINAL HERNIA REPAIR Left 2012   INSERT / REPLACE / REMOVE PACEMAKER  06/19/2016   LAPAROSCOPIC ABLATION RENAL MASS     LEFT HEART CATH AND CORONARY ANGIOGRAPHY Left 10/23/2017   Procedure: LEFT HEART CATH AND CORONARY ANGIOGRAPHY;  Surgeon: Darron Deatrice LABOR, MD;  Location: [facility] INVASIVE CV LAB;  Service: Cardiovascular;  Laterality: Left;   LEFT HEART CATH AND CORONARY ANGIOGRAPHY Left 02/26/2021   Procedure: LEFT HEART CATH AND CORONARY ANGIOGRAPHY;  Surgeon: Darron Deatrice LABOR, MD;  Location:  [facility] INVASIVE CV LAB;  Service: Cardiovascular;  Laterality: Left;   LEFT HEART CATH AND CORONARY ANGIOGRAPHY Left 11/10/2023   Procedure: LEFT HEART CATH AND CORONARY ANGIOGRAPHY;  Surgeon: Darron Deatrice LABOR, MD;  Location: [facility] INVASIVE CV LAB;  Service: Cardiovascular;  Laterality: Left;   PACEMAKER IMPLANT N/A 06/19/2016   Procedure: Pacemaker Implant;  Surgeon: Elspeth JAYSON Sage, MD;  Location: [facility] INVASIVE CV LAB;  Service: Cardiovascular;  Laterality: N/A;   pacemasker     PROSTATE SURGERY     RIGHT/LEFT HEART CATH AND CORONARY ANGIOGRAPHY Bilateral 11/25/2022   Procedure: RIGHT/LEFT HEART CATH AND CORONARY ANGIOGRAPHY;  Surgeon: Darron Deatrice LABOR, MD;  Location: [facility] INVASIVE CV LAB;  Service: Cardiovascular;  Laterality: Bilateral;   SQUAMOUS CELL CARCINOMA EXCISION     face, nose left shoulder, left arm   TONSILLECTOMY AND ADENOIDECTOMY     Patient Active Problem List   Diagnosis Date Noted   Angina pectoris (HCC) 11/10/2023   Weakness of left foot 10/29/2023   CKD stage 3b, GFR 30-44 ml/min (HCC) 10/29/2023   Obstructive sleep apnea 10/29/2023   Combined systolic and diastolic congestive heart failure (HCC) 10/29/2023   AKI (acute kidney injury) (HCC) 04/29/2023   UTI (urinary tract infection) 04/29/2023   Chronic systolic heart failure (HCC) 11/25/2022   Sick sinus syndrome (HCC) 02/26/2022   Closed fracture of second lumbar vertebra (HCC) 09/26/2021   Dysphagia, unspecified 09/26/2021   Heart block 09/26/2021   Heartburn 09/26/2021   History of lumbar fusion 09/26/2021   Inflammation of sacroiliac joint (HCC) 09/26/2021   Other symptoms and signs involving cognitive functions and awareness 09/26/2021   Prediabetes 09/26/2021   Low back pain 09/26/2021   Renal cell carcinoma (HCC) 09/26/2021   Essential hypertension 09/26/2021   Cerebrovascular disease 09/26/2021   Transient cerebral ischemic attack, unspecified 09/26/2021   Cerebrovascular disease, unspecified 09/26/2021    Transient ischemic attack 09/26/2021   Unstable angina (HCC)    Lumbar burst fracture (HCC) 05/04/2020   Hypogonadism male 10/10/2019   Persistent atrial fibrillation (HCC) 01/12/2019   Acute low back pain 11/04/2017   Abnormal screening cardiac CT    Visit for monitoring Tikosyn  therapy 09/09/2017   History of TIA (transient ischemic attack) 08/29/2016   Paroxysmal atrial fibrillation (HCC) 07/23/2016   CAD (coronary artery disease) 07/23/2016   Snores 06/27/2016   S/P placement of cardiac pacemaker 06/25/2016   Hemiparesis (HCC) 06/25/2016   Acute ischemic stroke (HCC)    Left-sided weakness    Hemisensory loss    Dysarthria    Stroke-like symptoms    Complete heart block (HCC)    Pain in thoracic spine    Orthostasis    Lethargy  Vertebral artery occlusion, left    TIA (transient ischemic attack) 06/14/2016   Arthritis 02/06/2015   Esophageal reflux 02/06/2015   Arthritis urica 02/06/2015   Cannot sleep 02/06/2015   Arthritis, degenerative 02/06/2015   Adenocarcinoma, renal cell (HCC) 02/06/2015   Benign prostatic hyperplasia with lower urinary tract symptoms 11/21/2014   Personal history of other malignant neoplasm of kidney 11/21/2014   History of Lyme disease 05/06/2014   Palpitations 12/31/2013   Hyperlipidemia 11/02/2010   HYPERTENSION, BENIGN 04/16/2010    ONSET DATE: 05/06/2016   REFERRING DIAG:  R20.0 (ICD-10-CM) - Anesthesia of skin  R29.90 (ICD-10-CM) - Unspecified symptoms and signs involving the nervous system  G45.9 (ICD-10-CM) - Transient cerebral ischemic attack, unspecified    THERAPY DIAG:  No diagnosis found.  Rationale for Evaluation and Treatment: Rehabilitation  SUBJECTIVE:                                                                                                                                                                                             SUBJECTIVE STATEMENT:  Pt accompanied by spouse who was a Interior and spatial designer of  women's care at the hospital.  Pt reported he had a stroke in 2018 and a stent placed in June of 2025.  Pt notes that after the stint being placed, he felt numbness on the L side of the body.  Pt reports some weakness in the L LE, but more of the symptoms were found to be in the UE.    Pt had a prostate surgery back in 2022, and was seeing Shin-Yiing for issues in the sacroilliac region.    Pt accompanied by: significant other  PERTINENT HISTORY: Pt has prior history of pacemaker, heart block, stints, and stroke.    PAIN:  Are you having pain? No  PRECAUTIONS: Fall and ICD/Pacemaker  RED FLAGS: Cauda equina syndrome: Yes: pt reporting some incontinence issues and numbness and tingling in the genital region.     WEIGHT BEARING RESTRICTIONS: No  FALLS: Has patient fallen in last 6 months? Yes. Number of falls 2 falls in April.  Pt fell and broke ribs back in April, but has had more near miss falls lately due to the L LE giving out.    LIVING ENVIRONMENT: Lives with: lives with their spouse Lives in: House/apartment Stairs: Yes: External: 2 steps; none Has following equipment at home: None  PLOF: Independent  PATIENT GOALS: To have a normal walking gait, and reduce pain in the low back.  Increase flexibility.  OBJECTIVE:  Note: Objective measures were completed at Evaluation unless otherwise noted.  DIAGNOSTIC FINDINGS:    EXAM: CT  HEAD WITHOUT CONTRAST  IMPRESSION: 1.  No evidence of an acute intracranial abnormality. 2. Parenchymal atrophy and chronic small vessel ischemic disease.   COGNITION: Overall cognitive status: Within functional limits for tasks assessed   SENSATION: WFL  COORDINATION: WFL   POSTURE: rounded shoulders, forward head, decreased lumbar lordosis, and increased thoracic kyphosis  LOWER EXTREMITY ROM:     Active  Right Eval Left Eval  Hip flexion    Hip extension    Hip abduction    Hip adduction    Hip internal rotation    Hip  external rotation    Knee flexion    Knee extension    Ankle dorsiflexion    Ankle plantarflexion    Ankle inversion    Ankle eversion     (Blank rows = not tested)  LOWER EXTREMITY MMT:    MMT Right Eval Left Eval  Hip flexion 5 4  Hip extension    Hip abduction 5 4-  Hip adduction 5 4-  Hip internal rotation    Hip external rotation    Knee flexion 4+ 4-  Knee extension 4 4-  Ankle dorsiflexion 5 5  Ankle plantarflexion    Ankle inversion    Ankle eversion    (Blank rows = not tested)  BED MOBILITY:  Not tested  STAIRS: Not tested  FUNCTIONAL TESTS:  5 times sit to stand: 8.80 sec  Timed up and go (TUG): 8.61  miniBest Test: TBD  Single LE Balance Test: TBD  Dual-task Goal: TBD  Functional gait assessment: TBD  PATIENT SURVEYS:  ABC scale: The Activities-Specific Balance Confidence (ABC) Scale 0% 10 20 30  40 50 60 70 80 90 100% No confidence<->completely confident  "How confident are you that you will not lose your balance or become unsteady when you . . .   Date tested 12/15/23   Walk around the house 90%  2. Walk up or down stairs 90%  3. Bend over and pick up a slipper from in front of a closet floor 90%  4. Reach for a small can off a shelf at eye level 100%  5. Stand on tip toes and reach for something above your head 100%  6. Stand on a chair and reach for something 80%  7. Sweep the floor 90%  8. Walk outside the house to a car parked in the driveway 90%  9. Get into or out of a car 80%  10. Walk across a parking lot to the mall 90%  11. Walk up or down a ramp 80%  12. Walk in a crowded mall where people rapidly walk past you 90%  13. Are bumped into by people as you walk through the mall 80%  14. Step onto or off of an escalator while you are holding onto the railing 100%  15. Step onto or off an escalator while holding onto parcels such that you cannot hold onto the railing 80%  16. Walk outside on icy sidewalks 90%  Total: 16/16   88.75%  TREATMENT DATE: 12/15/23  Evaluation   Self-Care/Home Management:  Pt given education on current POC, the findings of the evaluation, and the ways in which skilled therapy can address the current deficits in balance and musculature strength.  Pt instructed on how this can improve their overall function and maintain/improve their overall quality of life.  Therapist also established and HEP and went over HEP with him and spouse for pt to perform between now and the next visit.    PATIENT EDUCATION: Education details: Pt educated on role of PT and services provided during current POC, along with prognosis and information about the clinic. Person educated: Patient and Spouse Education method: Explanation Education comprehension: verbalized understanding  HOME EXERCISE PROGRAM: Access Code: [email or web address] URL: [email or web address] Date: 12/15/2023 Prepared by: Sidra Simpers  Program Notes **Be sure to perform all exercises next to a countertop or sturdy piece of furniture in case you become unsteady.  Exercises - Standing Heel Raise with Support  - 1 x daily - 7 x weekly - 2 sets - 15 reps - Standing Toe Raises at Chair  - 1 x daily - 7 x weekly - 2 sets - 15 reps - Mini Squat with Counter Support  - 1 x daily - 7 x weekly - 2 sets - 15 reps - Lunge with Counter Support  - 1 x daily - 7 x weekly - 2 sets - 15 reps - Standing Tandem Balance with Counter Support  - 1 x daily - 7 x weekly - 2 sets - 2 reps - 30 hold - Standing Single Leg Stance with Counter Support  - 1 x daily - 7 x weekly - 2 sets - 2 reps - 30 hold  GOALS: Goals reviewed with patient? Yes  SHORT TERM GOALS: Target date: 01/12/2024  Pt will be independent with HEP in order to demonstrate increased ability to perform tasks related to occupation/hobbies. Baseline: Goal  status: INITIAL   LONG TERM GOALS: Target date: 03/08/2024  Pt will improve ABC by at least 8% in order to demonstrate improvement in balance confidence. Baseline: 88.75% Goal status: INITIAL   2.  Patient will increase Berg Balance score by > 6 points to demonstrate decreased fall risk during functional activities. Baseline: TBD Goal status: INITIAL  3.  Patient will increase FGA score by > 4 points to demonstrate decreased fall risk during functional activities. Baseline: TBD Goal status: INITIAL  4.  Patient will increase six minute walk test distance to >1400 for progression to community ambulator and improve gait ability Baseline: TBD Goal status: INITIAL    ASSESSMENT:  CLINICAL IMPRESSION: Patient is a 89 y.o. male who was seen today for physical therapy evaluation and treatment for imbalance.  Pt currently reporting that his primary concern is his L UE and the numbness that he is experiencing, however after being educated that he would likely be seeing OT for that aspect, pt reported he is wanting to improve his overall balance.  Pt noted to have fairly good ambulation, however poor postural stability and confidence level may be contributing to the amount of falls pt has had.  Pt will work with therapy in order to address these concerns in hopes to reduce risk of falling moving forward.  Pt demonstrates understanding of this plan of care and agrees with this plan.    OBJECTIVE IMPAIRMENTS: Abnormal gait, decreased activity tolerance, decreased balance, decreased coordination, decreased endurance, decreased mobility, difficulty walking, decreased strength, decreased safety awareness, and impaired sensation.   ACTIVITY  LIMITATIONS: carrying, lifting, bending, and stairs  PARTICIPATION LIMITATIONS: cleaning, community activity, and yard work  PERSONAL FACTORS: Age, Past/current experiences, and 3+ comorbidities: TIA, pacemaker, low back pain, thoracic back pain, L sided  weakness are also affecting patient's functional outcome.   REHAB POTENTIAL: Good  CLINICAL DECISION MAKING: Stable/uncomplicated  EVALUATION COMPLEXITY: Low  PLAN:  PT FREQUENCY: 1-2x/week  PT DURATION: 12 weeks  PLANNED INTERVENTIONS: 97750- Physical Performance Testing, 97110-Therapeutic exercises, 97530- Therapeutic activity, 97112- Neuromuscular re-education, 97535- Self Care, [id number]- Manual therapy, [phone]- Gait training, Joint mobilization, Spinal mobilization, and Vestibular training  PLAN FOR NEXT SESSION:  Continue with goal assessment: BERG, miniBestest, FGA?, dual task goals Assess compliance with HEP   Fonda Simpers, PT, DPT Physical Therapist - [facility]  [facility]  12/15/23, 8:50 AM

## 2023-12-15 NOTE — Therapy (Addendum)
 OUTPATIENT OCCUPATIONAL THERAPY NEURO EVALUATION  Patient Name: Thomas [name] MRN: [id number] DOB:09-01-1932, 89 y.o., male Today's Date: 12/15/2023  PCP: Charlie Forte REFERRING PROVIDER: [name]  END OF SESSION:  OT End of Session - 12/15/23 1100     Visit Number 1    Number of Visits 24   Date for OT Re-Evaluation 03/08/24   Activity Tolerance Patient tolerated treatment well    Behavior During Therapy [facility] for tasks assessed/performed          Past Medical History:  Diagnosis Date   Aortic insufficiency    a. 07/2023 Echo: Mod AI; b. 09/2023 Echo: Mild-mod AI.   Aortic valve disorders    Arthritis    Basal cell carcinoma    face, nose left shoulder, left arm (06/19/2016)   Basal cell carcinoma 09/13/2020   right temple   BBB (bundle branch block)    hx right   CAD (coronary artery disease)    a. 10/2017 Cath: mod, nonobs dzs; b. 02/2021 Cath: mod, nonobs dzs; c. 11/2022 Cath: LAD 30ost/p, 89m, D1 min irregs, LCX 65p/m, OM1 min irregs, OM2 40, OM3 nl, RCA 30p, 35m, 40d w/ 40 in side branch-->Med rx.   Chronic back pain    neck, thoracic, lower back (06/19/2016)   CKD (chronic kidney disease), stage III (HCC)    Complete heart block (HCC) 06/2016   a. 06/2016 s/p MDT PPM; b. 01/2023 s/p upgrade to MDT CRT-P (ser # [id number]).   GERD (gastroesophageal reflux disease)    Gout    Heart block    I've had type I, II Wenke before now (06/19/2016)   History of gout    History of hiatal hernia    self dx'd (06/19/2016)   Hyperlipidemia    Hypertension    Lyme disease    dx'd by me 2003; cx's showed dx 08/2015   Migraine    3-4/year (06/19/2016)   Mitral regurgitation    a. 07/2023 Echo: Mod MR.   NICM (nonischemic cardiomyopathy - in setting of RV pacing) (HCC)    a. 02/2019 Echo: EF 45-50%; b. 10/2022 Echo: EF 30-35%; c. 01/2023 CRT-P upgrade; e. 07/2023 Echo: EF 40-45%, glob HK; f. 10/2023 Echo: EF of 55 to 60% with grade 1 DD, nl RV fxn, triv MR, mild to mod  AI, and mildly dilated ascending aorta at 42 mm.   PAF (paroxysmal atrial fibrillation) (HCC)    a. CHA2DS2VASc = 7--> eliquis ; b. On tikosyn .   Paroxysmal atrial flutter (HCC)    Presence of permanent cardiac pacemaker    PVC's (premature ventricular contractions)    Renal cancer, left (HCC) 2006   S/P cryotherapy   Spinal stenosis    cervical, 1 thoracic, lumbar (06/19/2016)   Squamous carcinoma    face, nose left shoulder, left arm (06/19/2016)   Stroke (HCC)    TIA (transient ischemic attack) 06/14/2016   I'm not sure that's what it was (06/25/2016)   Visit for monitoring Tikosyn  therapy 09/09/2017   Past Surgical History:  Procedure Laterality Date   ANKLE FRACTURE SURGERY Right 1967   BACK SURGERY  05/07/2020   BASAL CELL CARCINOMA EXCISION     face, nose left shoulder, left arm   BIOPSY PROSTATE  2001 & 2003   BIV UPGRADE N/A 01/10/2023   Procedure: BIV UPGRADE;  Surgeon: Fernande Elspeth BROCKS, MD;  Location: [facility] INVASIVE CV LAB;  Service: Cardiovascular;  Laterality: N/A;   CARDIAC CATHETERIZATION  1990's   CARDIOVERSION N/A 09/11/2017  Procedure: CARDIOVERSION;  Surgeon: Pietro Redell RAMAN, MD;  Location: [facility] ENDOSCOPY;  Service: Cardiovascular;  Laterality: N/A;   CORONARY STENT INTERVENTION N/A 11/10/2023   Procedure: CORONARY STENT INTERVENTION;  Surgeon: Darron Deatrice LABOR, MD;  Location: [facility] INVASIVE CV LAB;  Service: Cardiovascular;  Laterality: N/A;   FRACTURE SURGERY     HOLEP-LASER ENUCLEATION OF THE PROSTATE WITH MORCELLATION N/A 07/10/2020   Procedure: HOLEP-LASER ENUCLEATION OF THE PROSTATE WITH MORCELLATION;  Surgeon: Penne Knee, MD;  Location: [facility] ORS;  Service: Urology;  Laterality: N/A;   INGUINAL HERNIA REPAIR Left 2012   INSERT / REPLACE / REMOVE PACEMAKER  06/19/2016   LAPAROSCOPIC ABLATION RENAL MASS     LEFT HEART CATH AND CORONARY ANGIOGRAPHY Left 10/23/2017   Procedure: LEFT HEART CATH AND CORONARY ANGIOGRAPHY;  Surgeon: Darron Deatrice LABOR, MD;   Location: [facility] INVASIVE CV LAB;  Service: Cardiovascular;  Laterality: Left;   LEFT HEART CATH AND CORONARY ANGIOGRAPHY Left 02/26/2021   Procedure: LEFT HEART CATH AND CORONARY ANGIOGRAPHY;  Surgeon: Darron Deatrice LABOR, MD;  Location: [facility] INVASIVE CV LAB;  Service: Cardiovascular;  Laterality: Left;   LEFT HEART CATH AND CORONARY ANGIOGRAPHY Left 11/10/2023   Procedure: LEFT HEART CATH AND CORONARY ANGIOGRAPHY;  Surgeon: Darron Deatrice LABOR, MD;  Location: [facility] INVASIVE CV LAB;  Service: Cardiovascular;  Laterality: Left;   PACEMAKER IMPLANT N/A 06/19/2016   Procedure: Pacemaker Implant;  Surgeon: Elspeth JAYSON Sage, MD;  Location: [facility] INVASIVE CV LAB;  Service: Cardiovascular;  Laterality: N/A;   pacemasker     PROSTATE SURGERY     RIGHT/LEFT HEART CATH AND CORONARY ANGIOGRAPHY Bilateral 11/25/2022   Procedure: RIGHT/LEFT HEART CATH AND CORONARY ANGIOGRAPHY;  Surgeon: Darron Deatrice LABOR, MD;  Location: [facility] INVASIVE CV LAB;  Service: Cardiovascular;  Laterality: Bilateral;   SQUAMOUS CELL CARCINOMA EXCISION     face, nose left shoulder, left arm   TONSILLECTOMY AND ADENOIDECTOMY     Patient Active Problem List   Diagnosis Date Noted   Angina pectoris (HCC) 11/10/2023   Weakness of left foot 10/29/2023   CKD stage 3b, GFR 30-44 ml/min (HCC) 10/29/2023   Obstructive sleep apnea 10/29/2023   Combined systolic and diastolic congestive heart failure (HCC) 10/29/2023   AKI (acute kidney injury) (HCC) 04/29/2023   UTI (urinary tract infection) 04/29/2023   Chronic systolic heart failure (HCC) 11/25/2022   Sick sinus syndrome (HCC) 02/26/2022   Closed fracture of second lumbar vertebra (HCC) 09/26/2021   Dysphagia, unspecified 09/26/2021   Heart block 09/26/2021   Heartburn 09/26/2021   History of lumbar fusion 09/26/2021   Inflammation of sacroiliac joint (HCC) 09/26/2021   Other symptoms and signs involving cognitive functions and awareness 09/26/2021   Prediabetes 09/26/2021   Low back pain  09/26/2021   Renal cell carcinoma (HCC) 09/26/2021   Essential hypertension 09/26/2021   Cerebrovascular disease 09/26/2021   Transient cerebral ischemic attack, unspecified 09/26/2021   Cerebrovascular disease, unspecified 09/26/2021   Transient ischemic attack 09/26/2021   Unstable angina (HCC)    Lumbar burst fracture (HCC) 05/04/2020   Hypogonadism male 10/10/2019   Persistent atrial fibrillation (HCC) 01/12/2019   Acute low back pain 11/04/2017   Abnormal screening cardiac CT    Visit for monitoring Tikosyn  therapy 09/09/2017   History of TIA (transient ischemic attack) 08/29/2016   Paroxysmal atrial fibrillation (HCC) 07/23/2016   CAD (coronary artery disease) 07/23/2016   Snores 06/27/2016   S/P placement of cardiac pacemaker 06/25/2016   Hemiparesis (HCC) 06/25/2016   Acute ischemic stroke (  HCC)    Left-sided weakness    Hemisensory loss    Dysarthria    Stroke-like symptoms    Complete heart block (HCC)    Pain in thoracic spine    Orthostasis    Lethargy    Vertebral artery occlusion, left    TIA (transient ischemic attack) 06/14/2016   Arthritis 02/06/2015   Esophageal reflux 02/06/2015   Arthritis urica 02/06/2015   Cannot sleep 02/06/2015   Arthritis, degenerative 02/06/2015   Adenocarcinoma, renal cell (HCC) 02/06/2015   Benign prostatic hyperplasia with lower urinary tract symptoms 11/21/2014   Personal history of other malignant neoplasm of kidney 11/21/2014   History of Lyme disease 05/06/2014   Palpitations 12/31/2013   Hyperlipidemia 11/02/2010   HYPERTENSION, BENIGN 04/16/2010    ONSET DATE: 11/10/23  REFERRING DIAG: CVA  THERAPY DIAG:  No diagnosis found.  Rationale for Evaluation and Treatment: Rehabilitation  SUBJECTIVE:   SUBJECTIVE STATEMENT: Pt reports numbness limiting ability to engage LUE in activities and is frustrated by the lack of explanation from neurologists.  Pt accompanied by: self and significant other  PERTINENT HISTORY:  89 year old male who was admitted to hospital on 11/10/23 for management of unstable angina after pt developed clumsiness of the left arm after cardiac catheterization 7/7, workup for TIA. Per neurology note symptoms secondary to relative cerebral hypoperfusion in the setting of heart disease and right carotid artery and M1 stenosis. PMhx: PAF on Eliquis , HTN, HLD, prediabetes, OSA on CPAP, dominant left vertebral artery occlusion, TIA, SSS s/p PPM, BPH, renal CA, lumbar fusion, A-fib.  PRECAUTIONS: None  WEIGHT BEARING RESTRICTIONS: No  PAIN:  Are you having pain? No  FALLS: Has patient fallen in last 6 months? Yes. Number of falls 4  LIVING ENVIRONMENT: Lives with: lives with their spouse Lives in: House/apartment Stairs: Yes: Internal: 2 steps; none Has following equipment at home: Single point cane and Walker - 2 wheeled  PLOF: Independent with basic ADLs  PATIENT GOALS: to go to [facility] for more detailed neuro testing and return to function in LUE  OBJECTIVE:  Note: Objective measures were completed at Evaluation unless otherwise noted.  HAND DOMINANCE: Right  ADLs: Overall ADLs: MIN A Transfers/ambulation related to ADLs: Eating: IND - uses dominant R Grooming: SETUP UB Dressing: SETUP - buttons assist LB Dressing: MIN - hiking pants  Toileting: IND Bathing: IND Tub Shower transfers: IND Equipment: none  IADLs: Shopping: spouse completes as needed Light housekeeping: spouse completes as needed Meal Prep: assist from spouse Community mobility: SPC/RW as needed Medication management: IND Landscape architect: IND Handwriting: 100% legible R hand  MOBILITY STATUS: Independent and Hx of falls  POSTURE COMMENTS:  rounded shoulders and forward head Sitting balance: Sits without UE support up to 30 sec  ACTIVITY TOLERANCE: Activity tolerance: IND - tolerates 20 min standing activities   FUNCTIONAL OUTCOME MEASURES:    UPPER EXTREMITY ROM:   BUE AROM WFL    UPPER EXTREMITY MMT:     MMT Right eval Left eval  Shoulder flexion    Shoulder abduction    Shoulder adduction    Shoulder extension    Shoulder internal rotation    Shoulder external rotation    Middle trapezius    Lower trapezius    Elbow flexion    Elbow extension    Wrist flexion    Wrist extension    Wrist ulnar deviation    Wrist radial deviation    Wrist pronation    Wrist supination    (  Blank rows = not tested)  HAND FUNCTION: Grip strength: Right: 76 lbs; Left: 55 lbs, Lateral pinch: Right: 15 lbs, Left: 11 lbs, and 3 point pinch: Right: 21 lbs, Left: 14 lbs  COORDINATION: 9 Hole Peg test: Right: 26 sec; Left: 10 sec  SENSATION: plan to assess next session Light touch:   Stereognosis:   Hot/Cold:   Proprioception:    EDEMA: mild swelling to L hand   COGNITION: Overall cognitive status: Within functional limits for tasks assessed  VISION: Subjective report: baseline Baseline vision: No visual deficits  VISION ASSESSMENT: WFL                                                                                                                            TREATMENT DATE: 12/15/23   Therapeutic Exercise:  -Blue theraputty issued and HEP reviewed.  -Pt performed hand strengthening with blue theraputty 1 set x 10 reps each:  gross grip loop, lateral pinch, 3pt. pinch, gross digit extension, digit extension table spread, digit abduction loop, single digit extension loop, thumb abduction. Pt. required verbal and tactile cues for proper technique. -Edema mgmt education including plan to trial contrast bath and Isotoner glove issued to trial over night time.     PATIENT EDUCATION: Education details: HEP, OT role Person educated: Patient and Spouse Education method: Explanation, Demonstration, and Handouts Education comprehension: verbalized understanding  HOME EXERCISE PROGRAM: Blue theraputty [facility] - coins at tabletop    GOALS: Goals reviewed with  patient? Yes  SHORT TERM GOALS: Target date: 01/26/24  Pt will IND complete HEP for functional strenghening of non-dominant LUE. Baseline: not initiated Goal status: INITIAL   LONG TERM GOALS: Target date: 03/08/24  Pt will increase L pinch strength by 5 # of force to securely hold a wrench during leisure activities. Baseline: L pinch 14# Goal status: INITIAL  2.  Pt will increase L grip strength by 15# to wash dishes without dropping items.  Baseline: L grip 55# Goal status: INITIAL  3.  Pt will improve LUE coordination by 30 seconds to independently manipulate fasteners during dressing tasks. Baseline: L 1'10.  Goal status: INITIAL  4.  Pt will improve dynamic seated balance to don B shoes independently on edge of bed without loss of balance. Baseline: requires chair with armrest to don shoes with standby assist Goal status: INITIAL   ASSESSMENT:  CLINICAL IMPRESSION: Patient is a 89 y.o. M who was seen today for occupational therapy evaluation for L CVA. Pt presents with gross balance and LUE weakness, edema, numbness and coordination deficits limiting pts ability to engage in I/ADLs including dressing, dishes, and car maintenance. Pt will benefit from OT services to work on L grip and lateral pinch strength, L FMC skills, and dynamic sitting balance to improve functional independence of ADL/IADL tasks at home.   PERFORMANCE DEFICITS: in functional skills including ADLs, IADLs, coordination, dexterity, sensation, and edema and psychosocial skills including environmental adaptation.   IMPAIRMENTS: are  limiting patient from ADLs, IADLs, and leisure.   CO-MORBIDITIES: may have co-morbidities  that affects occupational performance. Patient will benefit from skilled OT to address above impairments and improve overall function.  MODIFICATION OR ASSISTANCE TO COMPLETE EVALUATION: No modification of tasks or assist necessary to complete an evaluation.  OT OCCUPATIONAL PROFILE AND  HISTORY: Problem focused assessment: Including review of records relating to presenting problem.  CLINICAL DECISION MAKING: LOW - limited treatment options, no task modification necessary  REHAB POTENTIAL: Good  EVALUATION COMPLEXITY: Moderate    PLAN:  OT FREQUENCY: 1-2x/week  OT DURATION: 12 weeks  PLANNED INTERVENTIONS: 97168 OT Re-evaluation, 97535 self care/ADL training, 02889 therapeutic exercise, 97530 therapeutic activity, and 97112 neuromuscular re-education  RECOMMENDED OTHER SERVICES: PT  CONSULTED AND AGREED WITH PLAN OF CARE: Patient and family member/caregiver  PLAN FOR NEXT SESSION: sensation evaluation   Elston Slot, M.S. OTR/L  12/15/23, 11:55 AM  ascom [phone]  Elston JINNY Slot, OT 12/15/2023, 11:55 AM

## 2023-12-16 NOTE — Addendum Note (Signed)
 Addended by: SILVER FONDA ORN on: 12/16/2023 06:11 PM   Modules accepted: Orders

## 2023-12-16 NOTE — Therapy (Addendum)
 OUTPATIENT PHYSICAL THERAPY NEURO EVALUATION   Patient Name: Thomas [name] MRN: [id number] DOB:[birth year], 89 y.o., male Today's Date: 12/15/2023   PCP: [name], MD  REFERRING PROVIDER: Darron Deatrice LABOR, MD   END OF SESSION:   Past Medical History:  Diagnosis Date   Aortic insufficiency    a. 07/2023 Echo: Mod AI; b. 09/2023 Echo: Mild-mod AI.   Aortic valve disorders    Arthritis    Basal cell carcinoma    face, nose left shoulder, left arm (06/19/2016)   Basal cell carcinoma 09/13/2020   right temple   BBB (bundle branch block)    hx right   CAD (coronary artery disease)    a. 10/2017 Cath: mod, nonobs dzs; b. 02/2021 Cath: mod, nonobs dzs; c. 11/2022 Cath: LAD 30ost/p, 33m, D1 min irregs, LCX 65p/m, OM1 min irregs, OM2 40, OM3 nl, RCA 30p, 68m, 40d w/ 40 in side branch-->Med rx.   Chronic back pain    neck, thoracic, lower back (06/19/2016)   CKD (chronic kidney disease), stage III (HCC)    Complete heart block (HCC) 06/2016   a. 06/2016 s/p MDT PPM; b. 01/2023 s/p upgrade to MDT CRT-P (ser # [id number]).   GERD (gastroesophageal reflux disease)    Gout    Heart block    I've had type I, II Wenke before now (06/19/2016)   History of gout    History of hiatal hernia    self dx'd (06/19/2016)   Hyperlipidemia    Hypertension    Lyme disease    dx'd by me 2003; cx's showed dx 08/2015   Migraine    3-4/year (06/19/2016)   Mitral regurgitation    a. 07/2023 Echo: Mod MR.   NICM (nonischemic cardiomyopathy - in setting of RV pacing) (HCC)    a. 02/2019 Echo: EF 45-50%; b. 10/2022 Echo: EF 30-35%; c. 01/2023 CRT-P upgrade; e. 07/2023 Echo: EF 40-45%, glob HK; f. 10/2023 Echo: EF of 55 to 60% with grade 1 DD, nl RV fxn, triv MR, mild to mod AI, and mildly dilated ascending aorta at 42 mm.   PAF (paroxysmal atrial fibrillation) (HCC)    a. CHA2DS2VASc = 7--> eliquis ; b. On tikosyn .   Paroxysmal atrial flutter (HCC)    Presence of permanent cardiac pacemaker     PVC's (premature ventricular contractions)    Renal cancer, left (HCC) 2006   S/P cryotherapy   Spinal stenosis    cervical, 1 thoracic, lumbar (06/19/2016)   Squamous carcinoma    face, nose left shoulder, left arm (06/19/2016)   Stroke (HCC)    TIA (transient ischemic attack) 06/14/2016   I'm not sure that's what it was (06/25/2016)   Visit for monitoring Tikosyn  therapy 09/09/2017   Past Surgical History:  Procedure Laterality Date   ANKLE FRACTURE SURGERY Right 1967   BACK SURGERY  05/07/2020   BASAL CELL CARCINOMA EXCISION     face, nose left shoulder, left arm   BIOPSY PROSTATE  2001 & 2003   BIV UPGRADE N/A 01/10/2023   Procedure: BIV UPGRADE;  Surgeon: Fernande Elspeth BROCKS, MD;  Location: [facility] IN[facility]ASIVE [facility];  Service: Cardiovascular;  Laterality: N/A;   CARDIAC CATHETERIZATION  1990's   CARDIOVERSION N/A 09/11/2017   Procedure: CARDIOVERSION;  Surgeon: Pietro Redell GORMAN, MD;  Location: [facility] ENDOSCOPY;  Service: Cardiovascular;  Laterality: N/A;   CORONARY STENT INTERVENTION N/A 11/10/2023   Procedure: CORONARY STENT INTERVENTION;  Surgeon: Darron Deatrice LABOR, MD;  Location: [facility] INVASIVE CV LAB;  Service: Cardiovascular;  Laterality: N/A;   FRACTURE SURGERY     HOLEP-LASER ENUCLEATION OF THE PROSTATE WITH MORCELLATION N/A 07/10/2020   Procedure: HOLEP-LASER ENUCLEATION OF THE PROSTATE WITH MORCELLATION;  Surgeon: Penne Knee, MD;  Location: [facility] ORS;  Service: Urology;  Laterality: N/A;   INGUINAL HERNIA REPAIR Left 2012   INSERT / REPLACE / REMOVE PACEMAKER  06/19/2016   LAPAROSCOPIC ABLATION RENAL MASS     LEFT HEART CATH AND CORONARY ANGIOGRAPHY Left 10/23/2017   Procedure: LEFT HEART CATH AND CORONARY ANGIOGRAPHY;  Surgeon: Darron Deatrice LABOR, MD;  Location: [facility] INVASIVE CV LAB;  Service: Cardiovascular;  Laterality: Left;   LEFT HEART CATH AND CORONARY ANGIOGRAPHY Left 02/26/2021   Procedure: LEFT HEART CATH AND CORONARY ANGIOGRAPHY;  Surgeon: Darron Deatrice LABOR, MD;  Location:  [facility] INVASIVE CV LAB;  Service: Cardiovascular;  Laterality: Left;   LEFT HEART CATH AND CORONARY ANGIOGRAPHY Left 11/10/2023   Procedure: LEFT HEART CATH AND CORONARY ANGIOGRAPHY;  Surgeon: Darron Deatrice LABOR, MD;  Location: [facility] INVASIVE CV LAB;  Service: Cardiovascular;  Laterality: Left;   PACEMAKER IMPLANT N/A 06/19/2016   Procedure: Pacemaker Implant;  Surgeon: Elspeth JAYSON Sage, MD;  Location: [facility] INVASIVE CV LAB;  Service: Cardiovascular;  Laterality: N/A;   pacemasker     PROSTATE SURGERY     RIGHT/LEFT HEART CATH AND CORONARY ANGIOGRAPHY Bilateral 11/25/2022   Procedure: RIGHT/LEFT HEART CATH AND CORONARY ANGIOGRAPHY;  Surgeon: Darron Deatrice LABOR, MD;  Location: [facility] INVASIVE CV LAB;  Service: Cardiovascular;  Laterality: Bilateral;   SQUAMOUS CELL CARCINOMA EXCISION     face, nose left shoulder, left arm   TONSILLECTOMY AND ADENOIDECTOMY     Patient Active Problem List   Diagnosis Date Noted   Angina pectoris (HCC) 11/10/2023   Weakness of left foot 10/29/2023   CKD stage 3b, GFR 30-44 ml/min (HCC) 10/29/2023   Obstructive sleep apnea 10/29/2023   Combined systolic and diastolic congestive heart failure (HCC) 10/29/2023   AKI (acute kidney injury) (HCC) 04/29/2023   UTI (urinary tract infection) 04/29/2023   Chronic systolic heart failure (HCC) 11/25/2022   Sick sinus syndrome (HCC) 02/26/2022   Closed fracture of second lumbar vertebra (HCC) 09/26/2021   Dysphagia, unspecified 09/26/2021   Heart block 09/26/2021   Heartburn 09/26/2021   History of lumbar fusion 09/26/2021   Inflammation of sacroiliac joint (HCC) 09/26/2021   Other symptoms and signs involving cognitive functions and awareness 09/26/2021   Prediabetes 09/26/2021   Low back pain 09/26/2021   Renal cell carcinoma (HCC) 09/26/2021   Essential hypertension 09/26/2021   Cerebrovascular disease 09/26/2021   Transient cerebral ischemic attack, unspecified 09/26/2021   Cerebrovascular disease, unspecified 09/26/2021    Transient ischemic attack 09/26/2021   Unstable angina (HCC)    Lumbar burst fracture (HCC) 05/04/2020   Hypogonadism male 10/10/2019   Persistent atrial fibrillation (HCC) 01/12/2019   Acute low back pain 11/04/2017   Abnormal screening cardiac CT    Visit for monitoring Tikosyn  therapy 09/09/2017   History of TIA (transient ischemic attack) 08/29/2016   Paroxysmal atrial fibrillation (HCC) 07/23/2016   CAD (coronary artery disease) 07/23/2016   Snores 06/27/2016   S/P placement of cardiac pacemaker 06/25/2016   Hemiparesis (HCC) 06/25/2016   Acute ischemic stroke (HCC)    Left-sided weakness    Hemisensory loss    Dysarthria    Stroke-like symptoms    Complete heart block (HCC)    Pain in thoracic spine    Orthostasis    Lethargy  Vertebral artery occlusion, left    TIA (transient ischemic attack) 06/14/2016   Arthritis 02/06/2015   Esophageal reflux 02/06/2015   Arthritis urica 02/06/2015   Cannot sleep 02/06/2015   Arthritis, degenerative 02/06/2015   Adenocarcinoma, renal cell (HCC) 02/06/2015   Benign prostatic hyperplasia with lower urinary tract symptoms 11/21/2014   Personal history of other malignant neoplasm of kidney 11/21/2014   History of Lyme disease 05/06/2014   Palpitations 12/31/2013   Hyperlipidemia 11/02/2010   HYPERTENSION, BENIGN 04/16/2010    ONSET DATE: 05/06/2016   REFERRING DIAG:  R20.0 (ICD-10-CM) - Anesthesia of skin  R29.90 (ICD-10-CM) - Unspecified symptoms and signs involving the nervous system  G45.9 (ICD-10-CM) - Transient cerebral ischemic attack, unspecified    THERAPY DIAG:  No diagnosis found.  Rationale for Evaluation and Treatment: Rehabilitation  SUBJECTIVE:                                                                                                                                                                                             SUBJECTIVE STATEMENT:  Pt accompanied by spouse who was a Interior and spatial designer of  women's care at the hospital.  Pt reported he had a stroke in 2018 and a stent placed in June of 2025.  Pt notes that after the stint being placed, he felt numbness on the L side of the body.  Pt reports some weakness in the L LE, but more of the symptoms were found to be in the UE.    Pt had a prostate surgery back in 2022, and was seeing Shin-Yiing for issues in the sacroilliac region.    Pt accompanied by: significant other  PERTINENT HISTORY: Pt has prior history of pacemaker, heart block, stints, and stroke.    PAIN:  Are you having pain? No  PRECAUTIONS: Fall and ICD/Pacemaker  RED FLAGS: Cauda equina syndrome: Yes: pt reporting some incontinence issues and numbness and tingling in the genital region.     WEIGHT BEARING RESTRICTIONS: No  FALLS: Has patient fallen in last 6 months? Yes. Number of falls 2 falls in April.  Pt fell and broke ribs back in April, but has had more near miss falls lately due to the L LE giving out.    LIVING ENVIRONMENT: Lives with: lives with their spouse Lives in: House/apartment Stairs: Yes: External: 2 steps; none Has following equipment at home: None  PLOF: Independent  PATIENT GOALS: To have a normal walking gait, and reduce pain in the low back.  Increase flexibility.  OBJECTIVE:  Note: Objective measures were completed at Evaluation unless otherwise noted.  DIAGNOSTIC FINDINGS:    EXAM: CT  HEAD WITHOUT CONTRAST  IMPRESSION: 1.  No evidence of an acute intracranial abnormality. 2. Parenchymal atrophy and chronic small vessel ischemic disease.   COGNITION: Overall cognitive status: Within functional limits for tasks assessed   SENSATION: WFL  COORDINATION: WFL   POSTURE: rounded shoulders, forward head, decreased lumbar lordosis, and increased thoracic kyphosis  LOWER EXTREMITY ROM:     Active  Right Eval Left Eval  Hip flexion    Hip extension    Hip abduction    Hip adduction    Hip internal rotation    Hip  external rotation    Knee flexion    Knee extension    Ankle dorsiflexion    Ankle plantarflexion    Ankle inversion    Ankle eversion     (Blank rows = not tested)  LOWER EXTREMITY MMT:    MMT Right Eval Left Eval  Hip flexion 5 4  Hip extension    Hip abduction 5 4-  Hip adduction 5 4-  Hip internal rotation    Hip external rotation    Knee flexion 4+ 4-  Knee extension 4 4-  Ankle dorsiflexion 5 5  Ankle plantarflexion    Ankle inversion    Ankle eversion    (Blank rows = not tested)  BED MOBILITY:  Not tested  STAIRS: Not tested  FUNCTIONAL TESTS:  5 times sit to stand: 8.80 sec  Timed up and go (TUG): 8.61  miniBest Test: TBD  Single LE Balance Test: TBD  Dual-task Goal: TBD  Functional gait assessment: TBD  PATIENT SURVEYS:  ABC scale: The Activities-Specific Balance Confidence (ABC) Scale 0% 10 20 30  40 50 60 70 80 90 100% No confidence<->completely confident  "How confident are you that you will not lose your balance or become unsteady when you . . .   Date tested 12/15/23   Walk around the house 90%  2. Walk up or down stairs 90%  3. Bend over and pick up a slipper from in front of a closet floor 90%  4. Reach for a small can off a shelf at eye level 100%  5. Stand on tip toes and reach for something above your head 100%  6. Stand on a chair and reach for something 80%  7. Sweep the floor 90%  8. Walk outside the house to a car parked in the driveway 90%  9. Get into or out of a car 80%  10. Walk across a parking lot to the mall 90%  11. Walk up or down a ramp 80%  12. Walk in a crowded mall where people rapidly walk past you 90%  13. Are bumped into by people as you walk through the mall 80%  14. Step onto or off of an escalator while you are holding onto the railing 100%  15. Step onto or off an escalator while holding onto parcels such that you cannot hold onto the railing 80%  16. Walk outside on icy sidewalks 90%  Total: 16/16   88.75%  TREATMENT DATE: 12/15/23  Evaluation   Self-Care/Home Management:  Pt given education on current POC, the findings of the evaluation, and the ways in which skilled therapy can address the current deficits in balance and musculature strength.  Pt instructed on how this can improve their overall function and maintain/improve their overall quality of life.  Therapist also established and HEP and went over HEP with him and spouse for pt to perform between now and the next visit.    PATIENT EDUCATION: Education details: Pt educated on role of PT and services provided during current POC, along with prognosis and information about the clinic. Person educated: Patient and Spouse Education method: Explanation Education comprehension: verbalized understanding  HOME EXERCISE PROGRAM: Access Code: [email or web address] URL: [email or web address] Date: 12/15/2023 Prepared by: Sidra Simpers  Program Notes **Be sure to perform all exercises next to a countertop or sturdy piece of furniture in case you become unsteady.  Exercises - Standing Heel Raise with Support  - 1 x daily - 7 x weekly - 2 sets - 15 reps - Standing Toe Raises at Chair  - 1 x daily - 7 x weekly - 2 sets - 15 reps - Mini Squat with Counter Support  - 1 x daily - 7 x weekly - 2 sets - 15 reps - Lunge with Counter Support  - 1 x daily - 7 x weekly - 2 sets - 15 reps - Standing Tandem Balance with Counter Support  - 1 x daily - 7 x weekly - 2 sets - 2 reps - 30 hold - Standing Single Leg Stance with Counter Support  - 1 x daily - 7 x weekly - 2 sets - 2 reps - 30 hold  GOALS: Goals reviewed with patient? Yes  SHORT TERM GOALS: Target date: 01/12/2024  Pt will be independent with HEP in order to demonstrate increased ability to perform tasks related to occupation/hobbies. Baseline: Goal  status: INITIAL   LONG TERM GOALS: Target date: 03/08/2024  Pt will improve ABC by at least 8% in order to demonstrate improvement in balance confidence. Baseline: 88.75% Goal status: INITIAL   2.  Patient will increase Berg Balance score by > 6 points to demonstrate decreased fall risk during functional activities. Baseline: TBD Goal status: INITIAL  3.  Patient will increase FGA score by > 4 points to demonstrate decreased fall risk during functional activities. Baseline: TBD Goal status: INITIAL  4.  Patient will increase six minute walk test distance to >1400 for progression to community ambulator and improve gait ability Baseline: TBD Goal status: INITIAL    ASSESSMENT:  CLINICAL IMPRESSION: Patient is a 89 y.o. male who was seen today for physical therapy evaluation and treatment for imbalance.  Pt currently reporting that his primary concern is his L UE and the numbness that he is experiencing, however after being educated that he would likely be seeing OT for that aspect, pt reported he is wanting to improve his overall balance.  Pt noted to have fairly good ambulation, however poor postural stability and confidence level may be contributing to the amount of falls pt has had.  Pt will work with therapy in order to address these concerns in hopes to reduce risk of falling moving forward.  Pt demonstrates understanding of this plan of care and agrees with this plan.    OBJECTIVE IMPAIRMENTS: Abnormal gait, decreased activity tolerance, decreased balance, decreased coordination, decreased endurance, decreased mobility, difficulty walking, decreased strength, decreased safety awareness, and impaired sensation.   ACTIVITY  LIMITATIONS: carrying, lifting, bending, and stairs  PARTICIPATION LIMITATIONS: cleaning, community activity, and yard work  PERSONAL FACTORS: Age, Past/current experiences, and 3+ comorbidities: TIA, pacemaker, low back pain, thoracic back pain, L sided  weakness are also affecting patient's functional outcome.   REHAB POTENTIAL: Good  CLINICAL DECISION MAKING: Stable/uncomplicated  EVALUATION COMPLEXITY: Low  PLAN:  PT FREQUENCY: 1-2x/week  PT DURATION: 12 weeks  PLANNED INTERVENTIONS: 97750- Physical Performance Testing, 97110-Therapeutic exercises, 97530- Therapeutic activity, 97112- Neuromuscular re-education, 97535- Self Care, [id number]- Manual therapy, [phone]- Gait training, Joint mobilization, Spinal mobilization, and Vestibular training  PLAN FOR NEXT SESSION:  Continue with goal assessment: BERG, miniBestest, FGA?, dual task goals Assess compliance with HEP   Fonda Simpers, PT, DPT Physical Therapist - [facility]  [facility]  12/15/23, 8:50 AM

## 2023-12-17 ENCOUNTER — Ambulatory Visit

## 2023-12-17 ENCOUNTER — Encounter: Payer: Self-pay | Admitting: Occupational Therapy

## 2023-12-17 ENCOUNTER — Ambulatory Visit: Admitting: Occupational Therapy

## 2023-12-17 DIAGNOSIS — R293 Abnormal posture: Secondary | ICD-10-CM | POA: Diagnosis not present

## 2023-12-17 DIAGNOSIS — R278 Other lack of coordination: Secondary | ICD-10-CM

## 2023-12-17 DIAGNOSIS — G4733 Obstructive sleep apnea (adult) (pediatric): Secondary | ICD-10-CM | POA: Diagnosis not present

## 2023-12-17 DIAGNOSIS — M6281 Muscle weakness (generalized): Secondary | ICD-10-CM | POA: Diagnosis not present

## 2023-12-17 DIAGNOSIS — R2689 Other abnormalities of gait and mobility: Secondary | ICD-10-CM

## 2023-12-17 DIAGNOSIS — M545 Low back pain, unspecified: Secondary | ICD-10-CM

## 2023-12-17 DIAGNOSIS — G8929 Other chronic pain: Secondary | ICD-10-CM

## 2023-12-17 DIAGNOSIS — M533 Sacrococcygeal disorders, not elsewhere classified: Secondary | ICD-10-CM

## 2023-12-17 DIAGNOSIS — Z85528 Personal history of other malignant neoplasm of kidney: Secondary | ICD-10-CM | POA: Diagnosis not present

## 2023-12-17 DIAGNOSIS — Z8673 Personal history of transient ischemic attack (TIA), and cerebral infarction without residual deficits: Secondary | ICD-10-CM | POA: Diagnosis not present

## 2023-12-17 DIAGNOSIS — I10 Essential (primary) hypertension: Secondary | ICD-10-CM | POA: Diagnosis not present

## 2023-12-17 NOTE — Therapy (Signed)
 OUTPATIENT OCCUPATIONAL THERAPY NEURO EVALUATION  Patient Name: Thomas [name] MRN: [id number] DOB:January 15, 1933, 89 y.o., male Today's Date: 12/17/2023  PCP: Charlie Forte REFERRING PROVIDER: [name]  END OF SESSION:  OT End of Session - 12/17/23 1022     Visit Number 2    Number of Visits 24    Date for OT Re-Evaluation 03/08/24    OT Start Time 1022    OT Stop Time 1100    OT Time Calculation (min) 38 min    Activity Tolerance Patient tolerated treatment well    Behavior During Therapy [facility] for tasks assessed/performed          Past Medical History:  Diagnosis Date   Aortic insufficiency    a. 07/2023 Echo: Mod AI; b. 09/2023 Echo: Mild-mod AI.   Aortic valve disorders    Arthritis    Basal cell carcinoma    face, nose left shoulder, left arm (06/19/2016)   Basal cell carcinoma 09/13/2020   right temple   BBB (bundle branch block)    hx right   CAD (coronary artery disease)    a. 10/2017 Cath: mod, nonobs dzs; b. 02/2021 Cath: mod, nonobs dzs; c. 11/2022 Cath: LAD 30ost/p, 73m, D1 min irregs, LCX 65p/m, OM1 min irregs, OM2 40, OM3 nl, RCA 30p, 3m, 40d w/ 40 in side branch-->Med rx.   Chronic back pain    neck, thoracic, lower back (06/19/2016)   CKD (chronic kidney disease), stage III (HCC)    Complete heart block (HCC) 06/2016   a. 06/2016 s/p MDT PPM; b. 01/2023 s/p upgrade to MDT CRT-P (ser # [id number]).   GERD (gastroesophageal reflux disease)    Gout    Heart block    I've had type I, II Wenke before now (06/19/2016)   History of gout    History of hiatal hernia    self dx'd (06/19/2016)   Hyperlipidemia    Hypertension    Lyme disease    dx'd by me 2003; cx's showed dx 08/2015   Migraine    3-4/year (06/19/2016)   Mitral regurgitation    a. 07/2023 Echo: Mod MR.   NICM (nonischemic cardiomyopathy - in setting of RV pacing) (HCC)    a. 02/2019 Echo: EF 45-50%; b. 10/2022 Echo: EF 30-35%; c. 01/2023 CRT-P upgrade; e. 07/2023 Echo: EF 40-45%, glob  HK; f. 10/2023 Echo: EF of 55 to 60% with grade 1 DD, nl RV fxn, triv MR, mild to mod AI, and mildly dilated ascending aorta at 42 mm.   PAF (paroxysmal atrial fibrillation) (HCC)    a. CHA2DS2VASc = 7--> eliquis ; b. On tikosyn .   Paroxysmal atrial flutter (HCC)    Presence of permanent cardiac pacemaker    PVC's (premature ventricular contractions)    Renal cancer, left (HCC) 2006   S/P cryotherapy   Spinal stenosis    cervical, 1 thoracic, lumbar (06/19/2016)   Squamous carcinoma    face, nose left shoulder, left arm (06/19/2016)   Stroke (HCC)    TIA (transient ischemic attack) 06/14/2016   I'm not sure that's what it was (06/25/2016)   Visit for monitoring Tikosyn  therapy 09/09/2017   Past Surgical History:  Procedure Laterality Date   ANKLE FRACTURE SURGERY Right 1967   BACK SURGERY  05/07/2020   BASAL CELL CARCINOMA EXCISION     face, nose left shoulder, left arm   BIOPSY PROSTATE  2001 & 2003   BIV UPGRADE N/A 01/10/2023   Procedure: BIV UPGRADE;  Surgeon: Fernande Standing  C, MD;  Location: [facility] INVASIVE CV LAB;  Service: Cardiovascular;  Laterality: N/A;   CARDIAC CATHETERIZATION  1990's   CARDIOVERSION N/A 09/11/2017   Procedure: CARDIOVERSION;  Surgeon: Pietro Redell RAMAN, MD;  Location: [facility] ENDOSCOPY;  Service: Cardiovascular;  Laterality: N/A;   CORONARY STENT INTERVENTION N/A 11/10/2023   Procedure: CORONARY STENT INTERVENTION;  Surgeon: Darron Deatrice LABOR, MD;  Location: [facility] INVASIVE CV LAB;  Service: Cardiovascular;  Laterality: N/A;   FRACTURE SURGERY     HOLEP-LASER ENUCLEATION OF THE PROSTATE WITH MORCELLATION N/A 07/10/2020   Procedure: HOLEP-LASER ENUCLEATION OF THE PROSTATE WITH MORCELLATION;  Surgeon: Penne Knee, MD;  Location: [facility] ORS;  Service: Urology;  Laterality: N/A;   INGUINAL HERNIA REPAIR Left 2012   INSERT / REPLACE / REMOVE PACEMAKER  06/19/2016   LAPAROSCOPIC ABLATION RENAL MASS     LEFT HEART CATH AND CORONARY ANGIOGRAPHY Left 10/23/2017   Procedure:  LEFT HEART CATH AND CORONARY ANGIOGRAPHY;  Surgeon: Darron Deatrice LABOR, MD;  Location: [facility] INVASIVE CV LAB;  Service: Cardiovascular;  Laterality: Left;   LEFT HEART CATH AND CORONARY ANGIOGRAPHY Left 02/26/2021   Procedure: LEFT HEART CATH AND CORONARY ANGIOGRAPHY;  Surgeon: Darron Deatrice LABOR, MD;  Location: [facility] INVASIVE CV LAB;  Service: Cardiovascular;  Laterality: Left;   LEFT HEART CATH AND CORONARY ANGIOGRAPHY Left 11/10/2023   Procedure: LEFT HEART CATH AND CORONARY ANGIOGRAPHY;  Surgeon: Darron Deatrice LABOR, MD;  Location: [facility] INVASIVE CV LAB;  Service: Cardiovascular;  Laterality: Left;   PACEMAKER IMPLANT N/A 06/19/2016   Procedure: Pacemaker Implant;  Surgeon: Elspeth JAYSON Sage, MD;  Location: [facility] INVASIVE CV LAB;  Service: Cardiovascular;  Laterality: N/A;   pacemasker     PROSTATE SURGERY     RIGHT/LEFT HEART CATH AND CORONARY ANGIOGRAPHY Bilateral 11/25/2022   Procedure: RIGHT/LEFT HEART CATH AND CORONARY ANGIOGRAPHY;  Surgeon: Darron Deatrice LABOR, MD;  Location: [facility] INVASIVE CV LAB;  Service: Cardiovascular;  Laterality: Bilateral;   SQUAMOUS CELL CARCINOMA EXCISION     face, nose left shoulder, left arm   TONSILLECTOMY AND ADENOIDECTOMY     Patient Active Problem List   Diagnosis Date Noted   Angina pectoris (HCC) 11/10/2023   Weakness of left foot 10/29/2023   CKD stage 3b, GFR 30-44 ml/min (HCC) 10/29/2023   Obstructive sleep apnea 10/29/2023   Combined systolic and diastolic congestive heart failure (HCC) 10/29/2023   AKI (acute kidney injury) (HCC) 04/29/2023   UTI (urinary tract infection) 04/29/2023   Chronic systolic heart failure (HCC) 11/25/2022   Sick sinus syndrome (HCC) 02/26/2022   Closed fracture of second lumbar vertebra (HCC) 09/26/2021   Dysphagia, unspecified 09/26/2021   Heart block 09/26/2021   Heartburn 09/26/2021   History of lumbar fusion 09/26/2021   Inflammation of sacroiliac joint (HCC) 09/26/2021   Other symptoms and signs involving cognitive  functions and awareness 09/26/2021   Prediabetes 09/26/2021   Low back pain 09/26/2021   Renal cell carcinoma (HCC) 09/26/2021   Essential hypertension 09/26/2021   Cerebrovascular disease 09/26/2021   Transient cerebral ischemic attack, unspecified 09/26/2021   Cerebrovascular disease, unspecified 09/26/2021   Transient ischemic attack 09/26/2021   Unstable angina (HCC)    Lumbar burst fracture (HCC) 05/04/2020   Hypogonadism male 10/10/2019   Persistent atrial fibrillation (HCC) 01/12/2019   Acute low back pain 11/04/2017   Abnormal screening cardiac CT    Visit for monitoring Tikosyn  therapy 09/09/2017   History of TIA (transient ischemic attack) 08/29/2016   Paroxysmal atrial fibrillation (HCC) 07/23/2016  CAD (coronary artery disease) 07/23/2016   Snores 06/27/2016   S/P placement of cardiac pacemaker 06/25/2016   Hemiparesis (HCC) 06/25/2016   Acute ischemic stroke (HCC)    Left-sided weakness    Hemisensory loss    Dysarthria    Stroke-like symptoms    Complete heart block (HCC)    Pain in thoracic spine    Orthostasis    Lethargy    Vertebral artery occlusion, left    TIA (transient ischemic attack) 06/14/2016   Arthritis 02/06/2015   Esophageal reflux 02/06/2015   Arthritis urica 02/06/2015   Cannot sleep 02/06/2015   Arthritis, degenerative 02/06/2015   Adenocarcinoma, renal cell (HCC) 02/06/2015   Benign prostatic hyperplasia with lower urinary tract symptoms 11/21/2014   Personal history of other malignant neoplasm of kidney 11/21/2014   History of Lyme disease 05/06/2014   Palpitations 12/31/2013   Hyperlipidemia 11/02/2010   HYPERTENSION, BENIGN 04/16/2010    ONSET DATE: 11/10/23  REFERRING DIAG: CVA  THERAPY DIAG:  Other lack of coordination  Other abnormalities of gait and mobility  Rationale for Evaluation and Treatment: Rehabilitation  SUBJECTIVE:   SUBJECTIVE STATEMENT: Pt reports desire to tie fishing line and is excited for his 3 week  beach trip.  Pt accompanied by: self and significant other  PERTINENT HISTORY: 89 year old male who was admitted to hospital on 11/10/23 for management of unstable angina after pt developed clumsiness of the left arm after cardiac catheterization 7/7, workup for TIA. Per neurology note symptoms secondary to relative cerebral hypoperfusion in the setting of heart disease and right carotid artery and M1 stenosis. PMhx: PAF on Eliquis , HTN, HLD, prediabetes, OSA on CPAP, dominant left vertebral artery occlusion, TIA, SSS s/p PPM, BPH, renal CA, lumbar fusion, A-fib.  PRECAUTIONS: None  WEIGHT BEARING RESTRICTIONS: No  PAIN:  Are you having pain? No  FALLS: Has patient fallen in last 6 months? Yes. Number of falls 4  LIVING ENVIRONMENT: Lives with: lives with their spouse Lives in: House/apartment Stairs: Yes: Internal: 2 steps; none Has following equipment at home: Single point cane and Walker - 2 wheeled  PLOF: Independent with basic ADLs  PATIENT GOALS: to go to [facility] for more detailed neuro testing and return to function in LUE  OBJECTIVE:  Note: Objective measures were completed at Evaluation unless otherwise noted.  HAND DOMINANCE: Right  ADLs: Overall ADLs: MIN A Transfers/ambulation related to ADLs: Eating: IND - uses dominant R Grooming: SETUP UB Dressing: SETUP - buttons assist LB Dressing: MIN - hiking pants  Toileting: IND Bathing: IND Tub Shower transfers: IND Equipment: none  IADLs: Shopping: spouse completes as needed Light housekeeping: spouse completes as needed Meal Prep: assist from spouse Community mobility: SPC/RW as needed Medication management: IND Landscape architect: IND Handwriting: 100% legible R hand  MOBILITY STATUS: Independent and Hx of falls  POSTURE COMMENTS:  rounded shoulders and forward head Sitting balance: Sits without UE support up to 30 sec  ACTIVITY TOLERANCE: Activity tolerance: IND - tolerates 20 min standing activities    FUNCTIONAL OUTCOME MEASURES:    UPPER EXTREMITY ROM:   BUE AROM WFL   UPPER EXTREMITY MMT:     MMT Right eval Left eval  Shoulder flexion    Shoulder abduction    Shoulder adduction    Shoulder extension    Shoulder internal rotation    Shoulder external rotation    Middle trapezius    Lower trapezius    Elbow flexion    Elbow extension    Wrist  flexion    Wrist extension    Wrist ulnar deviation    Wrist radial deviation    Wrist pronation    Wrist supination    (Blank rows = not tested)  HAND FUNCTION: Grip strength: Right: 76 lbs; Left: 55 lbs, Lateral pinch: Right: 15 lbs, Left: 11 lbs, and 3 point pinch: Right: 21 lbs, Left: 14 lbs  COORDINATION: 9 Hole Peg test: Right: 26 sec; Left: 10 sec  SENSATION:  12/17/23 Light touch: decreased sensation and numbness in L hand 5th digit Stereognosis:   Hot/Cold:   Proprioception: intact  EDEMA: mild swelling to L hand   COGNITION: Overall cognitive status: Within functional limits for tasks assessed  VISION: Subjective report: baseline Baseline vision: No visual deficits  VISION ASSESSMENT: WFL                                                                                                                            TREATMENT DATE: 12/17/23   Therapeutic Exercise:  -seated core exercises L lateral leans 2 set x 10 reps each. Functional reaching outside BOS across tabletop x3 directions for ~5 min.   Therapeutic Activity: -Reviewed [facility] home exercises with handout provided.  -Facilitated [facility] flipping cards while alternating directions to incorporate thumb on fingers/fingers on  thumb motion in the development of fine motor coordination skills. Worked on progressively increasing the speed while maintaining control. Attempted shuffling cards for bimanual integration however unable to maintain grip on both decks.   -Facilitated [facility] skills grasping coins from a tabletop surface and working on translatory  movement to bring coin from palm to fingertip. Instructed to complete palm up then progress to palm down.    Self Care:  -Pt worked on bimanual dexterity with shoe lace to tie bowline knots in preparation for managing fishing lines.  -Completed 1 row of buttoning and unbuttoning dress shirt on table, required increased time   PATIENT EDUCATION: Education details: HEP, OT role Person educated: Patient and Spouse Education method: Explanation, Demonstration, and Handouts Education comprehension: verbalized understanding  HOME EXERCISE PROGRAM: Blue theraputty [facility] - coins at tabletop    GOALS: Goals reviewed with patient? Yes  SHORT TERM GOALS: Target date: 01/26/24  Pt will IND complete HEP for functional strenghening of non-dominant LUE. Baseline: not initiated Goal status: INITIAL   LONG TERM GOALS: Target date: 03/08/24  Pt will increase L pinch strength by 5 # of force to securely hold a wrench during leisure activities. Baseline: L pinch 14# Goal status: INITIAL  2.  Pt will increase L grip strength by 15# to wash dishes without dropping items.  Baseline: L grip 55# Goal status: INITIAL  3.  Pt will improve LUE coordination by 30 seconds to independently manipulate fasteners during dressing tasks. Baseline: L 1'10.  Goal status: INITIAL  4.  Pt will improve dynamic seated balance to don B shoes independently on edge of bed without loss of balance. Baseline: requires chair with  armrest to don shoes with standby assist Goal status: INITIAL   ASSESSMENT:  CLINICAL IMPRESSION: Pt and spouse present for session, reports plan to leave for ~3 week beach trip - session focused on building home program. Pt reports improved edema with compression glove. Trialed card flipping and shuffling with cues for proper wrist movements to flip cards. Handout provided for [facility] HEP including coins (reviewed), buttoning, and tying knots in string. Pt successfully tied a bowline knot in  shoe string, unable to untie. Reviewed seated core exercises to improve balance during LB dressing. Continues to present with LUE ataxia limiting ability to engage in I/ADLs. Pt will benefit from OT services to work on L grip and lateral pinch strength, L FMC skills, and dynamic sitting balance to improve functional independence of ADL/IADL tasks at home.   PERFORMANCE DEFICITS: in functional skills including ADLs, IADLs, coordination, dexterity, sensation, and edema and psychosocial skills including environmental adaptation.   IMPAIRMENTS: are limiting patient from ADLs, IADLs, and leisure.   CO-MORBIDITIES: may have co-morbidities  that affects occupational performance. Patient will benefit from skilled OT to address above impairments and improve overall function.  MODIFICATION OR ASSISTANCE TO COMPLETE EVALUATION: No modification of tasks or assist necessary to complete an evaluation.  OT OCCUPATIONAL PROFILE AND HISTORY: Problem focused assessment: Including review of records relating to presenting problem.  CLINICAL DECISION MAKING: LOW - limited treatment options, no task modification necessary  REHAB POTENTIAL: Good  EVALUATION COMPLEXITY: Moderate    PLAN:  OT FREQUENCY: 1-2x/week  OT DURATION: 12 weeks  PLANNED INTERVENTIONS: 97168 OT Re-evaluation, 97535 self care/ADL training, 02889 therapeutic exercise, 97530 therapeutic activity, and 97112 neuromuscular re-education  RECOMMENDED OTHER SERVICES: PT  CONSULTED AND AGREED WITH PLAN OF CARE: Patient and family member/caregiver  PLAN FOR NEXT SESSION: sensation evaluation   Elston Slot, M.S. OTR/L  12/17/23, 10:27 AM  ascom [phone]  Elston JINNY Slot, OT 12/17/2023, 10:27 AM

## 2023-12-17 NOTE — Therapy (Signed)
 OUTPATIENT PHYSICAL THERAPY NEURO TREATMENT   Patient Name: Thomas [name] MRN: [id number] DOB:01-27-1933, 89 y.o., male Today's Date: 12/15/2023   PCP: [name], MD  REFERRING PROVIDER: Darron Deatrice LABOR, MD   END OF SESSION:   Past Medical History:  Diagnosis Date   Aortic insufficiency    a. 07/2023 Echo: Mod AI; b. 09/2023 Echo: Mild-mod AI.   Aortic valve disorders    Arthritis    Basal cell carcinoma    face, nose left shoulder, left arm (06/19/2016)   Basal cell carcinoma 09/13/2020   right temple   BBB (bundle branch block)    hx right   CAD (coronary artery disease)    a. 10/2017 Cath: mod, nonobs dzs; b. 02/2021 Cath: mod, nonobs dzs; c. 11/2022 Cath: LAD 30ost/p, 23m, D1 min irregs, LCX 65p/m, OM1 min irregs, OM2 40, OM3 nl, RCA 30p, 47m, 40d w/ 40 in side branch-->Med rx.   Chronic back pain    neck, thoracic, lower back (06/19/2016)   CKD (chronic kidney disease), stage III (HCC)    Complete heart block (HCC) 06/2016   a. 06/2016 s/p MDT PPM; b. 01/2023 s/p upgrade to MDT CRT-P (ser # [id number]).   GERD (gastroesophageal reflux disease)    Gout    Heart block    I've had type I, II Wenke before now (06/19/2016)   History of gout    History of hiatal hernia    self dx'd (06/19/2016)   Hyperlipidemia    Hypertension    Lyme disease    dx'd by me 2003; cx's showed dx 08/2015   Migraine    3-4/year (06/19/2016)   Mitral regurgitation    a. 07/2023 Echo: Mod MR.   NICM (nonischemic cardiomyopathy - in setting of RV pacing) (HCC)    a. 02/2019 Echo: EF 45-50%; b. 10/2022 Echo: EF 30-35%; c. 01/2023 CRT-P upgrade; e. 07/2023 Echo: EF 40-45%, glob HK; f. 10/2023 Echo: EF of 55 to 60% with grade 1 DD, nl RV fxn, triv MR, mild to mod AI, and mildly dilated ascending aorta at 42 mm.   PAF (paroxysmal atrial fibrillation) (HCC)    a. CHA2DS2VASc = 7--> eliquis ; b. On tikosyn .   Paroxysmal atrial flutter (HCC)    Presence of permanent cardiac pacemaker     PVC's (premature ventricular contractions)    Renal cancer, left (HCC) 2006   S/P cryotherapy   Spinal stenosis    cervical, 1 thoracic, lumbar (06/19/2016)   Squamous carcinoma    face, nose left shoulder, left arm (06/19/2016)   Stroke (HCC)    TIA (transient ischemic attack) 06/14/2016   I'm not sure that's what it was (06/25/2016)   Visit for monitoring Tikosyn  therapy 09/09/2017   Past Surgical History:  Procedure Laterality Date   ANKLE FRACTURE SURGERY Right 1967   BACK SURGERY  05/07/2020   BASAL CELL CARCINOMA EXCISION     face, nose left shoulder, left arm   BIOPSY PROSTATE  2001 & 2003   BIV UPGRADE N/A 01/10/2023   Procedure: BIV UPGRADE;  Surgeon: Fernande Elspeth BROCKS, MD;  Location: [facility];  Service: Cardiovascular;  Laterality: N/A;   CARDIAC CATHETERIZATION  1990's   CARDIOVERSION N/A 09/11/2017   Procedure: CARDIOVERSION;  Surgeon: Pietro Redell GORMAN, MD;  Location: [facility] ENDOSCOPY;  Service: Cardiovascular;  Laterality: N/A;   CORONARY STENT INTERVENTION N/A 11/10/2023   Procedure: CORONARY STENT INTERVENTION;  Surgeon: Darron Deatrice LABOR, MD;  Location: [facility] INVASIVE CV LAB;  Service: Cardiovascular;  Laterality: N/A;   FRACTURE SURGERY     HOLEP-LASER ENUCLEATION OF THE PROSTATE WITH MORCELLATION N/A 07/10/2020   Procedure: HOLEP-LASER ENUCLEATION OF THE PROSTATE WITH MORCELLATION;  Surgeon: Penne Knee, MD;  Location: [facility] ORS;  Service: Urology;  Laterality: N/A;   INGUINAL HERNIA REPAIR Left 2012   INSERT / REPLACE / REMOVE PACEMAKER  06/19/2016   LAPAROSCOPIC ABLATION RENAL MASS     LEFT HEART CATH AND CORONARY ANGIOGRAPHY Left 10/23/2017   Procedure: LEFT HEART CATH AND CORONARY ANGIOGRAPHY;  Surgeon: Darron Deatrice LABOR, MD;  Location: [facility] INVASIVE CV LAB;  Service: Cardiovascular;  Laterality: Left;   LEFT HEART CATH AND CORONARY ANGIOGRAPHY Left 02/26/2021   Procedure: LEFT HEART CATH AND CORONARY ANGIOGRAPHY;  Surgeon: Darron Deatrice LABOR, MD;  Location:  [facility] INVASIVE CV LAB;  Service: Cardiovascular;  Laterality: Left;   LEFT HEART CATH AND CORONARY ANGIOGRAPHY Left 11/10/2023   Procedure: LEFT HEART CATH AND CORONARY ANGIOGRAPHY;  Surgeon: Darron Deatrice LABOR, MD;  Location: [facility] INVASIVE CV LAB;  Service: Cardiovascular;  Laterality: Left;   PACEMAKER IMPLANT N/A 06/19/2016   Procedure: Pacemaker Implant;  Surgeon: Elspeth JAYSON Sage, MD;  Location: [facility] INVASIVE CV LAB;  Service: Cardiovascular;  Laterality: N/A;   pacemasker     PROSTATE SURGERY     RIGHT/LEFT HEART CATH AND CORONARY ANGIOGRAPHY Bilateral 11/25/2022   Procedure: RIGHT/LEFT HEART CATH AND CORONARY ANGIOGRAPHY;  Surgeon: Darron Deatrice LABOR, MD;  Location: [facility] INVASIVE CV LAB;  Service: Cardiovascular;  Laterality: Bilateral;   SQUAMOUS CELL CARCINOMA EXCISION     face, nose left shoulder, left arm   TONSILLECTOMY AND ADENOIDECTOMY     Patient Active Problem List   Diagnosis Date Noted   Angina pectoris (HCC) 11/10/2023   Weakness of left foot 10/29/2023   CKD stage 3b, GFR 30-44 ml/min (HCC) 10/29/2023   Obstructive sleep apnea 10/29/2023   Combined systolic and diastolic congestive heart failure (HCC) 10/29/2023   AKI (acute kidney injury) (HCC) 04/29/2023   UTI (urinary tract infection) 04/29/2023   Chronic systolic heart failure (HCC) 11/25/2022   Sick sinus syndrome (HCC) 02/26/2022   Closed fracture of second lumbar vertebra (HCC) 09/26/2021   Dysphagia, unspecified 09/26/2021   Heart block 09/26/2021   Heartburn 09/26/2021   History of lumbar fusion 09/26/2021   Inflammation of sacroiliac joint (HCC) 09/26/2021   Other symptoms and signs involving cognitive functions and awareness 09/26/2021   Prediabetes 09/26/2021   Low back pain 09/26/2021   Renal cell carcinoma (HCC) 09/26/2021   Essential hypertension 09/26/2021   Cerebrovascular disease 09/26/2021   Transient cerebral ischemic attack, unspecified 09/26/2021   Cerebrovascular disease, unspecified 09/26/2021    Transient ischemic attack 09/26/2021   Unstable angina (HCC)    Lumbar burst fracture (HCC) 05/04/2020   Hypogonadism male 10/10/2019   Persistent atrial fibrillation (HCC) 01/12/2019   Acute low back pain 11/04/2017   Abnormal screening cardiac CT    Visit for monitoring Tikosyn  therapy 09/09/2017   History of TIA (transient ischemic attack) 08/29/2016   Paroxysmal atrial fibrillation (HCC) 07/23/2016   CAD (coronary artery disease) 07/23/2016   Snores 06/27/2016   S/P placement of cardiac pacemaker 06/25/2016   Hemiparesis (HCC) 06/25/2016   Acute ischemic stroke (HCC)    Left-sided weakness    Hemisensory loss    Dysarthria    Stroke-like symptoms    Complete heart block (HCC)    Pain in thoracic spine    Orthostasis    Lethargy  Vertebral artery occlusion, left    TIA (transient ischemic attack) 06/14/2016   Arthritis 02/06/2015   Esophageal reflux 02/06/2015   Arthritis urica 02/06/2015   Cannot sleep 02/06/2015   Arthritis, degenerative 02/06/2015   Adenocarcinoma, renal cell (HCC) 02/06/2015   Benign prostatic hyperplasia with lower urinary tract symptoms 11/21/2014   Personal history of other malignant neoplasm of kidney 11/21/2014   History of Lyme disease 05/06/2014   Palpitations 12/31/2013   Hyperlipidemia 11/02/2010   HYPERTENSION, BENIGN 04/16/2010    ONSET DATE: 05/06/2016   REFERRING DIAG:  R20.0 (ICD-10-CM) - Anesthesia of skin  R29.90 (ICD-10-CM) - Unspecified symptoms and signs involving the nervous system  G45.9 (ICD-10-CM) - Transient cerebral ischemic attack, unspecified    THERAPY DIAG:  No diagnosis found.  Rationale for Evaluation and Treatment: Rehabilitation  SUBJECTIVE:                                                                                                                                                                                             SUBJECTIVE STATEMENT: TODAY: Patient reports he is doing okay- States  balance is his biggest concern.   From EVAL: Pt accompanied by spouse who was a Interior and spatial designer of women's care at the hospital.  Pt reported he had a stroke in 2018 and a stent placed in June of 2025.  Pt notes that after the stint being placed, he felt numbness on the L side of the body.  Pt reports some weakness in the L LE, but more of the symptoms were found to be in the UE.    Pt had a prostate surgery back in 2022, and was seeing Shin-Yiing for issues in the sacroilliac region.    Pt accompanied by: significant other  PERTINENT HISTORY: Pt has prior history of pacemaker, heart block, stints, and stroke.    PAIN:  Are you having pain? No  PRECAUTIONS: Fall and ICD/Pacemaker  RED FLAGS: Cauda equina syndrome: Yes: pt reporting some incontinence issues and numbness and tingling in the genital region.     WEIGHT BEARING RESTRICTIONS: No  FALLS: Has patient fallen in last 6 months? Yes. Number of falls 2 falls in April.  Pt fell and broke ribs back in April, but has had more near miss falls lately due to the L LE giving out.    LIVING ENVIRONMENT: Lives with: lives with their spouse Lives in: House/apartment Stairs: Yes: External: 2 steps; none Has following equipment at home: None  PLOF: Independent  PATIENT GOALS: To have a normal walking gait, and reduce pain in the low back.  Increase flexibility.  OBJECTIVE:  Note: Objective  measures were completed at Evaluation unless otherwise noted.  DIAGNOSTIC FINDINGS:    EXAM: CT HEAD WITHOUT CONTRAST  IMPRESSION: 1.  No evidence of an acute intracranial abnormality. 2. Parenchymal atrophy and chronic small vessel ischemic disease.   COGNITION: Overall cognitive status: Within functional limits for tasks assessed   SENSATION: WFL  COORDINATION: WFL   POSTURE: rounded shoulders, forward head, decreased lumbar lordosis, and increased thoracic kyphosis  LOWER EXTREMITY ROM:     Active  Right Eval Left Eval  Hip  flexion    Hip extension    Hip abduction    Hip adduction    Hip internal rotation    Hip external rotation    Knee flexion    Knee extension    Ankle dorsiflexion    Ankle plantarflexion    Ankle inversion    Ankle eversion     (Blank rows = not tested)  LOWER EXTREMITY MMT:    MMT Right Eval Left Eval  Hip flexion 5 4  Hip extension    Hip abduction 5 4-  Hip adduction 5 4-  Hip internal rotation    Hip external rotation    Knee flexion 4+ 4-  Knee extension 4 4-  Ankle dorsiflexion 5 5  Ankle plantarflexion    Ankle inversion    Ankle eversion    (Blank rows = not tested)  BED MOBILITY:  Not tested  STAIRS: Not tested  FUNCTIONAL TESTS:  5 times sit to stand: 8.80 sec  Timed up and go (TUG): 8.61  miniBest Test: TBD  Single LE Balance Test: TBD  Dual-task Goal: TBD  Functional gait assessment: TBD  PATIENT SURVEYS:  ABC scale: The Activities-Specific Balance Confidence (ABC) Scale 0% 10 20 30  40 50 60 70 80 90 100% No confidence<->completely confident  "How confident are you that you will not lose your balance or become unsteady when you . . .   Date tested 12/15/23   Walk around the house 90%  2. Walk up or down stairs 90%  3. Bend over and pick up a slipper from in front of a closet floor 90%  4. Reach for a small can off a shelf at eye level 100%  5. Stand on tip toes and reach for something above your head 100%  6. Stand on a chair and reach for something 80%  7. Sweep the floor 90%  8. Walk outside the house to a car parked in the driveway 90%  9. Get into or out of a car 80%  10. Walk across a parking lot to the mall 90%  11. Walk up or down a ramp 80%  12. Walk in a crowded mall where people rapidly walk past you 90%  13. Are bumped into by people as you walk through the mall 80%  14. Step onto or off of an escalator while you are holding onto the railing 100%  15. Step onto or off an escalator while holding onto parcels such that  you cannot hold onto the railing 80%  16. Walk outside on icy sidewalks 90%  Total: 16/16  88.75%  TREATMENT DATE: 12/15/23    BERG/FGA:   [facility] PT Assessment - 12/17/23 1122       Standardized Balance Assessment   Standardized Balance Assessment Berg Balance Test      Berg Balance Test   Sit to Stand Able to stand without using hands and stabilize independently    Standing Unsupported Able to stand safely 2 minutes    Sitting with Back Unsupported but Feet Supported on Floor or Stool Able to sit safely and securely 2 minutes    Stand to Sit Sits safely with minimal use of hands    Transfers Able to transfer safely, minor use of hands    Standing Unsupported with Eyes Closed Able to stand 10 seconds with supervision    Standing Unsupported with Feet Together Able to place feet together independently and stand 1 minute safely    From Standing, Reach Forward with Outstretched Arm Can reach forward >12 cm safely (5)    From Standing Position, Pick up Object from Floor Able to pick up shoe safely and easily    From Standing Position, Turn to Look Behind Over each Shoulder Turn sideways only but maintains balance    Turn 360 Degrees Able to turn 360 degrees safely in 4 seconds or less    Standing Unsupported, Alternately Place Feet on Step/Stool Able to stand independently and safely and complete 8 steps in 20 seconds    Standing Unsupported, One Foot in Front Able to plae foot ahead of the other independently and hold 30 seconds    Standing on One Leg Tries to lift leg/unable to hold 3 seconds but remains standing independently    Total Score 48      Functional Gait  Assessment   Gait assessed  Yes    Gait Level Surface Walks 20 ft in less than 5.5 sec, no assistive devices, good speed, no evidence for imbalance, normal gait pattern, deviates no more than 6 in  outside of the 12 in walkway width.    Change in Gait Speed Able to smoothly change walking speed without loss of balance or gait deviation. Deviate no more than 6 in outside of the 12 in walkway width.    Gait with Horizontal Head Turns Performs head turns smoothly with slight change in gait velocity (eg, minor disruption to smooth gait path), deviates 6-10 in outside 12 in walkway width, or uses an assistive device.    Gait with Vertical Head Turns Performs head turns with no change in gait. Deviates no more than 6 in outside 12 in walkway width.    Gait and Pivot Turn Pivot turns safely within 3 sec and stops quickly with no loss of balance.    Step Over Obstacle Is able to step over 2 stacked shoe boxes taped together (9 in total height) without changing gait speed. No evidence of imbalance.    Gait with Narrow Base of Support Ambulates 4-7 steps.    Gait with Eyes Closed Walks 20 ft, no assistive devices, good speed, no evidence of imbalance, normal gait pattern, deviates no more than 6 in outside 12 in walkway width. Ambulates 20 ft in less than 7 sec.    Ambulating Backwards Walks 20 ft, no assistive devices, good speed, no evidence for imbalance, normal gait    Steps Alternating feet, no rail.    Total Score 27           Self care/Home management:   Reviewed balance portion of previous HEP: SLS and  Tandem standing- Reviewed frequency and duration. Added tandem walking at counter- down and back (Fwd/bwd) x 3-5. Added to HEP along with Clamshell and sidelying Hip abd as he is going to beach for next 2 weeks.    PATIENT EDUCATION: Education details: Pt educated on role of PT and services provided during current POC, along with prognosis and information about the clinic. Person educated: Patient and Spouse Education method: Explanation Education comprehension: verbalized understanding  HOME EXERCISE PROGRAM: Access Code: [email or web address] URL: [email or web address] Date:  12/17/2023 Prepared by: Reyes London  Exercises - Sidelying Hip Abduction with Resistance at Ankle  - 3 x weekly - 3 sets - 10 reps - Clamshell with Resistance  - 3 x weekly - 3 sets - 10 reps - Tandem Walking with Counter Support  - 3 x weekly - 3 sets - 10 reps    Access Code: [email or web address] URL: [email or web address] Date: 12/15/2023 Prepared by: Sidra Simpers  Program Notes **Be sure to perform all exercises next to a countertop or sturdy piece of furniture in case you become unsteady.  Exercises - Standing Heel Raise with Support  - 1 x daily - 7 x weekly - 2 sets - 15 reps - Standing Toe Raises at Chair  - 1 x daily - 7 x weekly - 2 sets - 15 reps - Mini Squat with Counter Support  - 1 x daily - 7 x weekly - 2 sets - 15 reps - Lunge with Counter Support  - 1 x daily - 7 x weekly - 2 sets - 15 reps - Standing Tandem Balance with Counter Support  - 1 x daily - 7 x weekly - 2 sets - 2 reps - 30 hold - Standing Single Leg Stance with Counter Support  - 1 x daily - 7 x weekly - 2 sets - 2 reps - 30 hold  GOALS: Goals reviewed with patient? Yes  SHORT TERM GOALS: Target date: 01/12/2024  Pt will be independent with HEP in order to demonstrate increased ability to perform tasks related to occupation/hobbies. Baseline: Goal status: INITIAL   LONG TERM GOALS: Target date: 03/08/2024  Pt will improve ABC by at least 8% in order to demonstrate improvement in balance confidence. Baseline: 88.75% Goal status: INITIAL   2.  Patient will increase Berg Balance score by > 6 points to demonstrate decreased fall risk during functional activities. Baseline: 12/17/2023= 48/56 Goal status: INITIAL  3.  Patient will increase FGA score by > 2 points to demonstrate decreased fall risk during functional activities. Baseline: 12/17/2023= 27/30 Goal status: Revised  4.  Patient will increase six minute walk test distance to >1400 for progression to community ambulator and improve  gait ability Baseline: TBD Goal status: INITIAL    ASSESSMENT:  CLINICAL IMPRESSION: Patient is a 89 y.o. male who was seen today for physical therapy  treatment for imbalance.  Treatment further assessed balance today and patient did demonstrate some impairments as seen in both the BERG balance and FGA scoring 48/56 and 27/30 respectively. He had most difficulty with SLS and tandem which were added to HEP on initial evaluation and reviewed today. He was also provided some hip strengthening for HEP. He will benefit from further skilled PT services to address his balance impairment and restore functional independence and decrease risk of falling.   OBJECTIVE IMPAIRMENTS: Abnormal gait, decreased activity tolerance, decreased balance, decreased coordination, decreased endurance, decreased mobility, difficulty walking, decreased strength, decreased safety awareness, and impaired sensation.   ACTIVITY LIMITATIONS: carrying, lifting,  bending, and stairs  PARTICIPATION LIMITATIONS: cleaning, community activity, and yard work  PERSONAL FACTORS: Age, Past/current experiences, and 3+ comorbidities: TIA, pacemaker, low back pain, thoracic back pain, L sided weakness are also affecting patient's functional outcome.   REHAB POTENTIAL: Good  CLINICAL DECISION MAKING: Stable/uncomplicated  EVALUATION COMPLEXITY: Low  PLAN:  PT FREQUENCY: 1-2x/week  PT DURATION: 12 weeks  PLANNED INTERVENTIONS: 97750- Physical Performance Testing, 97110-Therapeutic exercises, 97530- Therapeutic activity, [id number]- Neuromuscular re-education, 97535- Self Care, [id number]- Manual therapy, [phone]- Gait training, Joint mobilization, Spinal mobilization, and Vestibular training  PLAN FOR NEXT SESSION:  Continue with goal assessment: 6 min walk dual task goals Assess compliance with HEP KORE balance and balance training LE strengthening.    Chyrl London, PT Physical Therapist - [facility]  12/15/23, 8:50 AM

## 2023-12-18 ENCOUNTER — Ambulatory Visit: Attending: Nurse Practitioner | Admitting: Nurse Practitioner

## 2023-12-18 ENCOUNTER — Encounter: Payer: Self-pay | Admitting: Nurse Practitioner

## 2023-12-18 VITALS — BP 124/58 | HR 71 | Ht 65.0 in | Wt 157.2 lb

## 2023-12-18 DIAGNOSIS — I10 Essential (primary) hypertension: Secondary | ICD-10-CM | POA: Diagnosis not present

## 2023-12-18 DIAGNOSIS — I351 Nonrheumatic aortic (valve) insufficiency: Secondary | ICD-10-CM

## 2023-12-18 DIAGNOSIS — E782 Mixed hyperlipidemia: Secondary | ICD-10-CM | POA: Diagnosis not present

## 2023-12-18 DIAGNOSIS — I442 Atrioventricular block, complete: Secondary | ICD-10-CM

## 2023-12-18 DIAGNOSIS — I5032 Chronic diastolic (congestive) heart failure: Secondary | ICD-10-CM | POA: Diagnosis not present

## 2023-12-18 DIAGNOSIS — I340 Nonrheumatic mitral (valve) insufficiency: Secondary | ICD-10-CM | POA: Diagnosis not present

## 2023-12-18 DIAGNOSIS — N183 Chronic kidney disease, stage 3 unspecified: Secondary | ICD-10-CM | POA: Diagnosis not present

## 2023-12-18 DIAGNOSIS — I255 Ischemic cardiomyopathy: Secondary | ICD-10-CM | POA: Diagnosis not present

## 2023-12-18 DIAGNOSIS — D72829 Elevated white blood cell count, unspecified: Secondary | ICD-10-CM | POA: Diagnosis not present

## 2023-12-18 DIAGNOSIS — I25110 Atherosclerotic heart disease of native coronary artery with unstable angina pectoris: Secondary | ICD-10-CM | POA: Diagnosis not present

## 2023-12-18 DIAGNOSIS — I480 Paroxysmal atrial fibrillation: Secondary | ICD-10-CM | POA: Diagnosis not present

## 2023-12-18 DIAGNOSIS — G459 Transient cerebral ischemic attack, unspecified: Secondary | ICD-10-CM | POA: Diagnosis not present

## 2023-12-18 NOTE — Progress Notes (Signed)
 Office Visit    Patient Name: Thomas [name] Date of Encounter: 12/18/2023  Primary Care Provider:  [name], MD Primary Cardiologist:  [name], MD  Cardiology APP:  [name], NP  Electrophysiologist:  Elspeth Sage, MD   Chief Complaint    89 y.o. male with a history of nonobstructive CAD, complete heart block, nonischemic cardiomyopathy in the setting of prolonged RV pacing status post CRT-P (2024), chronic heart failure with midrange ejection fraction, moderate MR/AI, hypertension, hyperlipidemia, paroxysmal atrial fibrillation and flutter on Tikosyn  and Eliquis , TIA/stroke, and CKD III status post partial nephrectomy due to renal cell carcinoma, who presents for f/u after recent percutaneous intervention.  Past Medical History   Subjective   Past Medical History:  Diagnosis Date   Aortic insufficiency    a. 07/2023 Echo: Mod AI; b. 09/2023 Echo: Mild-mod AI.   Aortic valve disorders    Arthritis    Basal cell carcinoma    face, nose left shoulder, left arm (06/19/2016)   Basal cell carcinoma 09/13/2020   right temple   BBB (bundle branch block)    hx right   CAD (coronary artery disease)    a. 10/2017 Cath: mod, nonobs dzs; b. 02/2021 Cath: mod, nonobs dzs; c. 11/2022 Cath: LAD 30ost/p, 15m, D1 min irregs, LCX 65p/m, OM1 min irregs, OM2 40, OM3 nl, RCA 30p, 41m, 40d w/ 40 in side branch-->Med rx; d. 11/2023 PCI: LAD 90ost/p (3.5x15 Onyx Frontier DES). Otw stable anatomy.   Chronic back pain    neck, thoracic, lower back (06/19/2016)   CKD (chronic kidney disease), stage III (HCC)    Complete heart block (HCC) 06/2016   a. 06/2016 s/p MDT PPM; b. 01/2023 s/p upgrade to MDT CRT-P (ser # [id number]).   GERD (gastroesophageal reflux disease)    Gout    Heart block    I've had type I, II Wenke before now (06/19/2016)   History of gout    History of hiatal hernia    self dx'd (06/19/2016)   Hyperlipidemia    Hypertension    Lyme disease     dx'd by me 2003; cx's showed dx 08/2015   Migraine    3-4/year (06/19/2016)   Mitral regurgitation    a. 07/2023 Echo: Mod MR.   NICM (nonischemic cardiomyopathy - in setting of RV pacing) (HCC)    a. 02/2019 Echo: EF 45-50%; b. 10/2022 Echo: EF 30-35%; c. 01/2023 CRT-P upgrade; e. 07/2023 Echo: EF 40-45%, glob HK; f. 10/2023 Echo: EF of 55 to 60% with grade 1 DD, nl RV fxn, triv MR, mild to mod AI, and mildly dilated ascending aorta at 42 mm.   PAF (paroxysmal atrial fibrillation) (HCC)    a. CHA2DS2VASc = 7--> eliquis ; b. On tikosyn .   Paroxysmal atrial flutter (HCC)    Presence of permanent cardiac pacemaker    PVC's (premature ventricular contractions)    Renal cancer, left (HCC) 2006   S/P cryotherapy   Spinal stenosis    cervical, 1 thoracic, lumbar (06/19/2016)   Squamous carcinoma    face, nose left shoulder, left arm (06/19/2016)   Stroke (HCC)    TIA (transient ischemic attack) 06/14/2016   I'm not sure that's what it was (06/25/2016)   Visit for monitoring Tikosyn  therapy 09/09/2017   Past Surgical History:  Procedure Laterality Date   ANKLE FRACTURE SURGERY Right 1967   BACK SURGERY  05/07/2020   BASAL CELL CARCINOMA EXCISION     face, nose left  shoulder, left arm   BIOPSY PROSTATE  2001 & 2003   BIV UPGRADE N/A 01/10/2023   Procedure: BIV UPGRADE;  Surgeon: Fernande Elspeth BROCKS, MD;  Location: [facility] INVASIVE CV LAB;  Service: Cardiovascular;  Laterality: N/A;   CARDIAC CATHETERIZATION  1990's   CARDIOVERSION N/A 09/11/2017   Procedure: CARDIOVERSION;  Surgeon: Pietro Redell RAMAN, MD;  Location: [facility] ENDOSCOPY;  Service: Cardiovascular;  Laterality: N/A;   CORONARY STENT INTERVENTION N/A 11/10/2023   Procedure: CORONARY STENT INTERVENTION;  Surgeon: Darron Deatrice LABOR, MD;  Location: [facility] INVASIVE CV LAB;  Service: Cardiovascular;  Laterality: N/A;   FRACTURE SURGERY     HOLEP-LASER ENUCLEATION OF THE PROSTATE WITH MORCELLATION N/A 07/10/2020   Procedure: HOLEP-LASER ENUCLEATION  OF THE PROSTATE WITH MORCELLATION;  Surgeon: Penne Knee, MD;  Location: [facility] ORS;  Service: Urology;  Laterality: N/A;   INGUINAL HERNIA REPAIR Left 2012   INSERT / REPLACE / REMOVE PACEMAKER  06/19/2016   LAPAROSCOPIC ABLATION RENAL MASS     LEFT HEART CATH AND CORONARY ANGIOGRAPHY Left 10/23/2017   Procedure: LEFT HEART CATH AND CORONARY ANGIOGRAPHY;  Surgeon: Darron Deatrice LABOR, MD;  Location: [facility] INVASIVE CV LAB;  Service: Cardiovascular;  Laterality: Left;   LEFT HEART CATH AND CORONARY ANGIOGRAPHY Left 02/26/2021   Procedure: LEFT HEART CATH AND CORONARY ANGIOGRAPHY;  Surgeon: Darron Deatrice LABOR, MD;  Location: [facility] INVASIVE CV LAB;  Service: Cardiovascular;  Laterality: Left;   LEFT HEART CATH AND CORONARY ANGIOGRAPHY Left 11/10/2023   Procedure: LEFT HEART CATH AND CORONARY ANGIOGRAPHY;  Surgeon: Darron Deatrice LABOR, MD;  Location: [facility] INVASIVE CV LAB;  Service: Cardiovascular;  Laterality: Left;   PACEMAKER IMPLANT N/A 06/19/2016   Procedure: Pacemaker Implant;  Surgeon: Elspeth BROCKS Fernande, MD;  Location: [facility] INVASIVE CV LAB;  Service: Cardiovascular;  Laterality: N/A;   pacemasker     PROSTATE SURGERY     RIGHT/LEFT HEART CATH AND CORONARY ANGIOGRAPHY Bilateral 11/25/2022   Procedure: RIGHT/LEFT HEART CATH AND CORONARY ANGIOGRAPHY;  Surgeon: Darron Deatrice LABOR, MD;  Location: [facility] INVASIVE CV LAB;  Service: Cardiovascular;  Laterality: Bilateral;   SQUAMOUS CELL CARCINOMA EXCISION     face, nose left shoulder, left arm   TONSILLECTOMY AND ADENOIDECTOMY      Allergies  Allergies  Allergen Reactions   Other Other (See Comments)    Vicryl sutures - patient states that site becomes soupy   Baclofen     Severe delirium    Iodinated Contrast Media Hives   Iodine Hives and Other (See Comments)    IVP contrast   Penicillins Itching, Rash and Other (See Comments)    ITCHY FEELING IN FINGERS; Tolerated Zosyn  04/2023       History of Present Illness      89 y.o. y/o male with the  above past medical history including nonobstructive CAD, complete heart block, nonischemic cardiomyopathy, chronic heart failure with midrange ejection fraction, moderate MR/AI, hypertension, hyperlipidemia, paroxysmal atrial fibrillation and flutter, TIA/stroke, and CKD 3 status post partial nephrectomy in the setting of left renal cell carcinoma.  In the setting of admission for stroke, he was noted to have atrial fibrillation and complete heart block.  He underwent permanent pacemaker placement in February 2018.  He was also diagnosed with atrial fibrillation and subsequently underwent Tikosyn  loading.  He has been chronically coagulated with Eliquis .  Coronary CT angiogram in May 2019 showed a calcium  score of 1366 with possible significant LAD disease.  He underwent diagnostic catheterization which showed moderate, nonobstructive three-vessel disease  and medical therapy was recommended.  In June 2024, he was noted to have a drop in EF to 30-35% with global hypokinesis.  This was down from 45-50% in 2021.  He also complained of exertional chest discomfort.  He underwent diagnostic catheterization in July 2024 which showed stable three-vessel CAD with moderately calcified coronary arteries.  Right heart cath showed normal filling pressures and normal cardiac output.  Medical therapy was recommended and FFR of the RCA and circumflex with possible PCI could be considered in the future for refractory angina.  In the interim, he was referred back to electrophysiology in the setting of persistent RV pacing and new cardiomyopathy.  He underwent CRT-P in September 2024.  Follow-up echocardiogram in March 2025 showed some improvement in EF to 40-45% with RVSP 42.2 mmHg, moderate MR and moderate AI.  In May 2025, he was seen in clinic with complaints of progressive chest burning and left arm pain.  We initially added isosorbide  mononitrate therapy, which did not significantly change symptoms.  In late June 2025, he  was admitted with fatigue and left-sided clumsiness.  MRI/a of the brain showed no acute stroke with mild to moderate stenoses of the P2 segments of the posterior cerebral arteries bilaterally and mild stenosis of the right internal carotid artery terminus in the distal left M1 segment.  Echo showed an EF of 55 to 6% with grade 1 diastolic dysfunction, normal RV function, trivial MR, mild to moderate AI, and mildly dilated ascending aorta at 42 mm.    He continued to report exertional angina July 2025 office visit and we opted to pursue diagnostic catheterization, which showed progression of ostial/proximal LAD disease to 90% with otherwise stable, moderate multivessel nonobstructive coronary disease.  The LAD was felt to be the culprit vessel and this was successfully treated with a 3.5 x 15 mm Onyx frontier drug-eluting stent.  Postprocedure, he developed left arm numbness and weakness, similar to prior symptoms.  Head CT showed no acute findings.  He was seen by neurology with initial recommendation for BP 140/90 in the setting of known carotid and cerebrovascular disease and metoprolol  and Entresto  were discontinued.  Postprocedure creatinine was stable at 1.54 no mild leukocytosis was noted with a WBC of 16.7 (Received steroids periprocedurally).  He was discharged home on aspirin , Plavix , and Eliquis  x 1 week at which point, aspirin  was to be discontinued.      Since his PCI of the LAD in July, he has been doing well from a cardiac standpoint without chest pain or dyspnea.  He continues to note left arm numbness, which is bothersome, though he has not had any recurrent weakness episodes.  He denies palpitations, PND, orthopnea, dizziness, syncope, edema, or early satiety.  He has been tolerating Eliquis  and clopidogrel  without evidence of bleeding.  He has noted some lower bruising than usual. Objective   Home Medications    Current Outpatient Medications  Medication Sig Dispense Refill    acetaminophen  (TYLENOL ) 500 MG tablet Take 500-1,000 mg by mouth every 6 (six) hours as needed for mild pain (pain score 1-3) or fever.     apixaban  (ELIQUIS ) 2.5 MG TABS tablet Take 1 tablet (2.5 mg total) by mouth 2 (two) times daily. Start 7/8 PM dose 90 tablet 3   Artificial Tears ophthalmic solution Place 1 drop into both eyes in the morning, at noon, in the evening, and at bedtime.     atorvastatin  (LIPITOR ) 80 MG tablet Take 1 tablet (80 mg total) by  mouth daily. 30 tablet 1   buPROPion  (WELLBUTRIN  XL) 150 MG 24 hr tablet Take 150 mg by mouth daily.     calcium  carbonate (TUMS - DOSED IN MG ELEMENTAL CALCIUM ) 500 MG chewable tablet Chew 1-2 tablets by mouth daily as needed for indigestion.     clopidogrel  (PLAVIX ) 75 MG tablet Take 1 tablet (75 mg total) by mouth daily with breakfast. 90 tablet 3   Coenzyme Q10 (COQ10) 100 MG CAPS Take 100 mg by mouth daily.     docusate sodium  (COLACE) 100 MG capsule Take 100 mg by mouth daily as needed for mild constipation.     dofetilide  (TIKOSYN ) 125 MCG capsule TAKE 1 CAPSULE BY MOUTH TWICE DAILY 180 capsule 2   doxycycline  (VIBRA -TABS) 100 MG tablet Take 100 mg by mouth daily.     DULoxetine  (CYMBALTA ) 20 MG capsule Take 20 mg by mouth 2 (two) times daily.     febuxostat  (ULORIC ) 40 MG tablet Take 1 tablet (40 mg total) by mouth daily. 90 tablet 3   isosorbide  mononitrate (IMDUR ) 30 MG 24 hr tablet Take 0.5 tablets (15 mg total) by mouth daily. 30 tablet 3   ketoconazole  (NIZORAL ) 2 % cream Apply to the feet every night to prevent fungal infection. 60 g 11   metoprolol  succinate (TOPROL -XL) 25 MG 24 hr tablet Take 2 tablets (50 mg total) by mouth at bedtime. Take with or immediately following a meal. 90 tablet 3   metroNIDAZOLE  (FLAGYL ) 500 MG tablet TAKE 1 TABLET BY MOUTH TWICE DAILY ON SUNDAY AND MONDAY. 90 tablet 3   NEEDLE, DISP, 18 G (BD DISP NEEDLES) 18G X 1-1/2 MISC 1 mg by Does not apply route every 14 (fourteen) days. 50 each 0   NEEDLE,  DISP, 21 G (BD DISP NEEDLES) 21G X 1-1/2 MISC 1 mg by Does not apply route every 14 (fourteen) days. 50 each 0   nitroGLYCERIN  (NITROSTAT ) 0.4 MG SL tablet Place 1 tablet (0.4 mg total) under the tongue every 5 (five) minutes as needed for chest pain. 25 tablet 3   pantoprazole  (PROTONIX ) 40 MG tablet Take 1 tablet (40 mg total) by mouth daily. 90 tablet 3   predniSONE  (DELTASONE ) 50 MG tablet Take 1 tablet by mouth 13 hrs prior to test, another tablet 7 hours prior, and last dose 1 hour prior. 3 tablet 0   sacubitril -valsartan  (ENTRESTO ) 24-26 MG Take 1 tablet by mouth 2 (two) times daily. 180 tablet 3   Syringe, Disposable, (2-3CC SYRINGE) 3 ML MISC 1 mg by Does not apply route every 14 (fourteen) days. 25 each 3   terbinafine  (LAMISIL ) 250 MG tablet Take 1 tablet (250 mg total) by mouth daily. 30 tablet 0   Testosterone  1.62 % GEL APPLY 2 PUMPS DAILY 75 g 3   testosterone  cypionate (DEPOTESTOSTERONE CYPIONATE) 200 MG/ML injection INJECT 1 ML (200 MG TOTAL) INTO THE MUSCLE EVERY 28 DAYS 10 mL 0   aspirin  81 MG chewable tablet Chew 1 tablet (81 mg total) by mouth daily. Take ASA for 7 days (Patient not taking: Reported on 12/15/2023) 30 tablet 0   diphenhydrAMINE  (BENADRYL ) 50 MG tablet Take 1 tablet (50 mg total) by mouth once for 1 dose. 1 hour prior to test (Patient not taking: Reported on 12/15/2023) 1 tablet 0   No current facility-administered medications for this visit.     Physical Exam    VS:  BP (!) 124/58 (BP Location: Left Arm, Patient Position: Sitting)   Pulse 71  Ht 5' 5 (1.651 m)   Wt 157 lb 3.2 oz (71.3 kg)   SpO2 97%   BMI 26.16 kg/m  , BMI Body mass index is 26.16 kg/m.          Cardiac Rehabilitation Eligibility Assessment     GEN: Well nourished, well developed, in no acute distress. HEENT: normal. Neck: Supple, no JVD, carotid bruits, or masses. Cardiac: RRR, no murmurs, rubs, or gallops. No clubbing, cyanosis, edema.  Radials 2+/PT 2+ and equal bilaterally.   Right radial cath site without bleeding, bruit, or hematoma. Respiratory:  Respirations regular and unlabored, clear to auscultation bilaterally. GI: Soft, nontender, nondistended, BS + x 4. MS: no deformity or atrophy. Skin: warm and dry, no rash. Neuro:  Strength and sensation are intact. Psych: Normal affect.  Accessory Clinical Findings    ECG personally reviewed by me today - EKG Interpretation Date/Time:  Thursday December 18 2023 14:47:04 EDT Ventricular Rate:  71 PR Interval:  198 QRS Duration:  116 QT Interval:  418 QTC Calculation: 454 R Axis:   -32  Text Interpretation: AV dual-paced rhythm Confirmed by Vivienne Bruckner [id number]) on 12/18/2023 2:48:01 PM  - no acute changes.  Lab Results  Component Value Date   WBC 16.7 (H) 11/11/2023   HGB 12.8 (L) 11/11/2023   HCT 38.0 (L) 11/11/2023   MCV 86.2 11/11/2023   PLT 175 11/11/2023   Lab Results  Component Value Date   CREATININE 1.54 (H) 11/11/2023   BUN 35 (H) 11/11/2023   NA 137 11/11/2023   K 4.6 11/11/2023   CL 104 11/11/2023   CO2 24 11/11/2023   Lab Results  Component Value Date   ALT 17 10/28/2023   AST 25 10/28/2023   ALKPHOS 78 10/28/2023   BILITOT 0.4 10/28/2023   Lab Results  Component Value Date   CHOL 117 11/11/2023   HDL 55 11/11/2023   LDLCALC 56 11/11/2023   TRIG 30 11/11/2023   CHOLHDL 2.1 11/11/2023    Lab Results  Component Value Date   HGBA1C 6.2 (H) 10/28/2023   Lab Results  Component Value Date   TSH 2.420 01/09/2022       Assessment & Plan    1.  Coronary artery disease: Patient with recent progressive exertional angina in the setting of known moderate multivessel disease, prompting diagnostic catheterization in July which revealed significant progression of ostial/proximal LAD disease.  The 90% stenosis was successfully treated with a drug-eluting stent.  He otherwise had residual moderate multivessel disease with stable 65% proximal to mid left circumflex stenosis and  stable 70% mid RCA stenosis.  Postprocedure course was complicated by left-sided weakness with normal head CT.  In that setting, along with known history of carotid and cerebrovascular disease, he is off of metoprolol  and Entresto  to allow for permissive hypertension.  Pressures have been stable at home and he is 124/58 today.  As he is status post PCI and no longer having angina, will discontinue Imdur .  Continue clopidogrel  and statin therapy.  2.  Mixed ischemic/nonischemic cardiomyopathy/heart failure with improved ejection fraction: Patient with prior drop in EF to 30-35% in June of 2024 in the setting of persistent RV pacing.  He subsequently underwent CRT-P with improvement in EF to 40-45% by echo in March 2025, and more recently 55 to 60% by echo in June 2025.  He is currently off of metoprolol  and Entresto  therapy in the setting of TIAs and recommendation for permissive hypertension on the part of neurology.  Blood pressure stable today 124/58.  He is euvolemic and asymptomatic.  He will continue to follow his blood pressure at home.  We discussed that pending blood pressure response to  remaining off of antihypertensives, at some point, we would look to add back low-dose metoprolol  and at least ARB.  3.  Primary hypertension: Blood pressure is stable off of beta-blocker and Entresto .  4.  Hyperlipidemia: Typically followed at the [facility].  LDL of 91 in June on atorvastatin  80 mg daily.  LDL was previously much lower in September 2023 at 29.  Continue statin therapy.  Low threshold to add ezetimibe or PCSK9 inhibitor in the future if follow-up labs at the [facility] continue to show persistent LDL elevation.  5.  Paroxysmal atrial flutter/fibrillation: Quiescent.  He remains on Tikosyn  and is anticoagulated with Eliquis  2.5 mg twice daily.  6.  Complete heart block: Status post permanent pacemaker with CRT-P upgrade in 2024.  AV paced today.  7.  Stage III-IV chronic kidney disease: Creatinine was stable  surrounding diagnostic catheterization.  Now off of Entresto .  Follow-up basic metabolic panel today.  8.  Valvular heart disease/mitral regurgitation/aortic insufficiency: Patient with moderate MR and AI on echo in March 2025.  June 2025 echo showed trivial MR and mild to moderate AI.  9.  Leukocytosis: Noted post catheterization/PCI with white count of 16.7.  He did receive steroids periprocedurally in the setting of prior contrast allergy.  Will follow-up CBC.  10.  TIA: Patient with left-sided weakness in June and with recurrent left-sided weakness following PCI in July.  CT of the head was negative.  Neurology recommended outpatient follow-up as well as blood pressure goal of 140/90 in the setting of known carotid and cerebrovascular disease.  Currently off of beta-blocker and Entresto .  Ongoing left arm numbness.  Plans to follow-up with neurology at [facility].  11.  Disposition: Follow-up CBC and basic metabolic panel today.  Follow-up in clinic in 4 to 6 weeks or sooner if necessary.  Lonni Meager, NP 12/18/2023, 2:50 PM

## 2023-12-18 NOTE — Patient Instructions (Signed)
 Medication Instructions:  STOP the Imdur  *If you need a refill on your cardiac medications before your next appointment, please call your pharmacy*  Lab Work: Your provider would like for you to have the following labs today: CBC and BMET  If you have labs (blood work) drawn today and your tests are completely normal, you will receive your results only by: MyChart Message (if you have MyChart) OR A paper copy in the mail If you have any lab test that is abnormal or we need to change your treatment, we will call you to review the results.  Testing/Procedures: None ordered  Follow-Up: At [facility], you and your health needs are our priority.  As part of our continuing mission to provide you with exceptional heart care, our providers are all part of one team.  This team includes your primary Cardiologist (physician) and Advanced Practice Providers or APPs (Physician Assistants and Nurse Practitioners) who all work together to provide you with the care you need, when you need it.  Your next appointment:   6 week(s)  Provider:   [name], MD    We recommend signing up for the patient portal called MyChart.  Sign up information is provided on this After Visit Summary.  MyChart is used to connect with patients for Virtual Visits (Telemedicine).  Patients are able to view lab/test results, encounter notes, upcoming appointments, etc.  Non-urgent messages can be sent to your provider as well.   To learn more about what you can do with MyChart, go to [email or web address].

## 2023-12-19 ENCOUNTER — Ambulatory Visit: Payer: Self-pay | Admitting: Nurse Practitioner

## 2023-12-19 ENCOUNTER — Encounter: Payer: Self-pay | Admitting: Cardiovascular Disease

## 2023-12-19 ENCOUNTER — Other Ambulatory Visit
Admission: RE | Admit: 2023-12-19 | Discharge: 2023-12-19 | Disposition: A | Discharge status: Home / Self Care | Source: Ambulatory Visit | Attending: Nurse Practitioner | Admitting: Nurse Practitioner

## 2023-12-19 DIAGNOSIS — E875 Hyperkalemia: Secondary | ICD-10-CM | POA: Diagnosis not present

## 2023-12-19 LAB — BASIC METABOLIC PANEL WITH GFR
Anion gap: 8 (ref 5–15)
BUN/Creatinine Ratio: 16 (ref 10–24)
BUN: 31 mg/dL — ABNORMAL HIGH (ref 8–27)
BUN: 39 mg/dL — ABNORMAL HIGH (ref 8–23)
CO2: 21 mmol/L (ref 20–29)
CO2: 26 mmol/L (ref 22–32)
Calcium: 9.4 mg/dL (ref 8.6–10.2)
Calcium: 9.4 mg/dL (ref 8.9–10.3)
Chloride: 101 mmol/L (ref 96–106)
Chloride: 102 mmol/L (ref 98–111)
Creatinine, Ser: 2 mg/dL — ABNORMAL HIGH (ref 0.76–1.27)
Creatinine, Ser: 2.01 mg/dL — ABNORMAL HIGH (ref 0.61–1.24)
GFR, Estimated: 31 mL/min — ABNORMAL LOW (ref >60)
Glucose, Bld: 113 mg/dL — ABNORMAL HIGH (ref 70–99)
Glucose: 82 mg/dL (ref 70–99)
Potassium: 5.5 mmol/L — ABNORMAL HIGH (ref 3.5–5.2)
Potassium: 4.5 mmol/L (ref 3.5–5.1)
Sodium: 137 mmol/L (ref 134–144)
Sodium: 136 mmol/L (ref 135–145)
eGFR: 31 mL/min/1.73 — ABNORMAL LOW (ref >59)

## 2023-12-19 LAB — CBC
Hematocrit: 44.3 % (ref 37.5–51.0)
Hemoglobin: 13.6 g/dL (ref 13.0–17.7)
MCH: 29.3 pg (ref 26.6–33.0)
MCHC: 30.7 g/dL — ABNORMAL LOW (ref 31.5–35.7)
MCV: 96 fL (ref 79–97)
Platelets: 241 x10E3/uL (ref 150–450)
RBC: 4.64 x10E6/uL (ref 4.14–5.80)
RDW: 14.7 % (ref 11.6–15.4)
WBC: 7.1 x10E3/uL (ref 3.4–10.8)

## 2023-12-21 DIAGNOSIS — S300XXA Contusion of lower back and pelvis, initial encounter: Secondary | ICD-10-CM | POA: Diagnosis not present

## 2023-12-22 ENCOUNTER — Ambulatory Visit: Payer: Self-pay | Admitting: Nurse Practitioner

## 2024-01-06 NOTE — Therapy (Incomplete)
 OUTPATIENT PHYSICAL THERAPY NEURO TREATMENT   Patient Name: Thomas [name] MRN: [id number] DOB:[birth year], 89 y.o., male Today's Date: 12/15/2023   PCP: [name], MD  REFERRING PROVIDER: Darron Deatrice LABOR, MD   END OF SESSION:   Past Medical History:  Diagnosis Date   Aortic insufficiency    a. 07/2023 Echo: Mod AI; b. 09/2023 Echo: Mild-mod AI.   Aortic valve disorders    Arthritis    Basal cell carcinoma    face, nose left shoulder, left arm (06/19/2016)   Basal cell carcinoma 09/13/2020   right temple   BBB (bundle branch block)    hx right   CAD (coronary artery disease)    a. 10/2017 Cath: mod, nonobs dzs; b. 02/2021 Cath: mod, nonobs dzs; c. 11/2022 Cath: LAD 30ost/p, 60m, D1 min irregs, LCX 65p/m, OM1 min irregs, OM2 40, OM3 nl, RCA 30p, 86m, 40d w/ 40 in side branch-->Med rx.   Chronic back pain    neck, thoracic, lower back (06/19/2016)   CKD (chronic kidney disease), stage III (HCC)    Complete heart block (HCC) 06/2016   a. 06/2016 s/p MDT PPM; b. 01/2023 s/p upgrade to MDT CRT-P (ser # [id number]).   GERD (gastroesophageal reflux disease)    Gout    Heart block    I've had type I, II Wenke before now (06/19/2016)   History of gout    History of hiatal hernia    self dx'd (06/19/2016)   Hyperlipidemia    Hypertension    Lyme disease    dx'd by me 2003; cx's showed dx 08/2015   Migraine    3-4/year (06/19/2016)   Mitral regurgitation    a. 07/2023 Echo: Mod MR.   NICM (nonischemic cardiomyopathy - in setting of RV pacing) (HCC)    a. 02/2019 Echo: EF 45-50%; b. 10/2022 Echo: EF 30-35%; c. 01/2023 CRT-P upgrade; e. 07/2023 Echo: EF 40-45%, glob HK; f. 10/2023 Echo: EF of 55 to 60% with grade 1 DD, nl RV fxn, triv MR, mild to mod AI, and mildly dilated ascending aorta at 42 mm.   PAF (paroxysmal atrial fibrillation) (HCC)    a. CHA2DS2VASc = 7--> eliquis ; b. On tikosyn .   Paroxysmal atrial flutter (HCC)    Presence of permanent cardiac pacemaker     PVC's (premature ventricular contractions)    Renal cancer, left (HCC) 2006   S/P cryotherapy   Spinal stenosis    cervical, 1 thoracic, lumbar (06/19/2016)   Squamous carcinoma    face, nose left shoulder, left arm (06/19/2016)   Stroke (HCC)    TIA (transient ischemic attack) 06/14/2016   I'm not sure that's what it was (06/25/2016)   Visit for monitoring Tikosyn  therapy 09/09/2017   Past Surgical History:  Procedure Laterality Date   ANKLE FRACTURE SURGERY Right 1967   BACK SURGERY  05/07/2020   BASAL CELL CARCINOMA EXCISION     face, nose left shoulder, left arm   BIOPSY PROSTATE  2001 & 2003   BIV UPGRADE N/A 01/10/2023   Procedure: BIV UPGRADE;  Surgeon: Fernande Elspeth BROCKS, MD;  Location: [facility];  Service: Cardiovascular;  Laterality: N/A;   CARDIAC CATHETERIZATION  1990's   CARDIOVERSION N/A 09/11/2017   Procedure: CARDIOVERSION;  Surgeon: Pietro Redell GORMAN, MD;  Location: [facility] ENDOS[facility];  Service: Cardiovascular;  Laterality: N/A;   CORONARY STENT INTERVENTION N/A 11/10/2023   Procedure: CORONARY STENT INTERVENTION;  Surgeon: Darron Deatrice LABOR, MD;  Location: [facility] INVASIVE CV [facility];  Service: Cardiovascular;  Laterality: N/A;   FRACTURE SURGERY     HOLEP-LASER ENUCLEATION OF THE PROSTATE WITH MORCELLATION N/A 07/10/2020   Procedure: HOLEP-LASER ENUCLEATION OF THE PROSTATE WITH MORCELLATION;  Surgeon: Penne Knee, MD;  Location: [facility] ORS;  Service: Urology;  Laterality: N/A;   INGUINAL HERNIA REPAIR Left 2012   INSERT / REPLACE / REMOVE PACEMAKER  06/19/2016   LAPAROSCOPIC ABLATION RENAL MASS     LEFT HEART CATH AND CORONARY ANGIOGRAPHY Left 10/23/2017   Procedure: LEFT HEART CATH AND CORONARY ANGIOGRAPHY;  Surgeon: Darron Deatrice LABOR, MD;  Location: [facility] INV[facility] CV [facility];  Service: Cardiovascular;  Laterality: Left;   LEFT HEART CATH AND CORONARY ANGIOGRAPHY Left 02/26/2021   Procedure: LEFT HEART CATH AND CORONARY ANGIOGRAPHY;  Surgeon: Darron Deatrice LABOR, MD;  Location:  [facility];  Service: Cardiovascular;  Laterality: Left;   LEFT HEART CATH AND CORONARY ANGIOGRAPHY Left 11/10/2023   Procedure: LEFT HEART CATH AND CORONARY ANGIOGRAPHY;  Surgeon: Darron Deatrice LABOR, MD;  Location: [facility];  Service: Cardiovascular;  Laterality: Left;   PACEMAKER IMPLANT N/A 06/19/2016   Procedure: Pacemaker Implant;  Surgeon: Elspeth JAYSON Sage, MD;  Location: [facility] INVASIVE CV LAB;  Service: Cardiovascular;  Laterality: N/A;   pacemasker     PROSTATE SURGERY     RIGHT/LEFT HEART CATH AND CORONARY ANGIOGRAPHY Bilateral 11/25/2022   Procedure: RIGHT/LEFT HEART CATH AND CORONARY ANGIOGRAPHY;  Surgeon: Darron Deatrice LABOR, MD;  Location: [facility] INV[facility]IVE CV [facility];  Service: Cardiovascular;  Laterality: Bilateral;   SQUAMOUS CELL CARCINOMA EXCISION     face, nose left shoulder, left arm   TONSILLECTOMY AND ADENOIDECTOMY     Patient Active Problem List   Diagnosis Date Noted   Angina pectoris (HCC) 11/10/2023   Weakness of left foot 10/29/2023   CKD stage 3b, GFR 30-44 ml/min (HCC) 10/29/2023   Obstructive sleep apnea 10/29/2023   Combined systolic and diastolic congestive heart failure (HCC) 10/29/2023   AKI (acute kidney injury) (HCC) 04/29/2023   UTI (urinary tract infection) 04/29/2023   Chronic systolic heart failure (HCC) 11/25/2022   Sick sinus syndrome (HCC) 02/26/2022   Closed fracture of second lumbar vertebra (HCC) 09/26/2021   Dysphagia, unspecified 09/26/2021   Heart block 09/26/2021   Heartburn 09/26/2021   History of lumbar fusion 09/26/2021   Inflammation of sacroiliac joint (HCC) 09/26/2021   Other symptoms and signs involving cognitive functions and awareness 09/26/2021   Prediabetes 09/26/2021   Low back pain 09/26/2021   Renal cell carcinoma (HCC) 09/26/2021   Essential hypertension 09/26/2021   Cerebrovascular disease 09/26/2021   Transient cerebral ischemic attack, unspecified 09/26/2021   Cerebrovascular disease, unspecified 09/26/2021    Transient ischemic attack 09/26/2021   Unstable angina (HCC)    Lumbar burst fracture (HCC) 05/04/2020   Hypogonadism male 10/10/2019   Persistent atrial fibrillation (HCC) 01/12/2019   Acute low back pain 11/04/2017   Abnormal screening cardiac CT    Visit for monitoring Tikosyn  therapy 09/09/2017   History of TIA (transient ischemic attack) 08/29/2016   Paroxysmal atrial fibrillation (HCC) 07/23/2016   CAD (coronary artery disease) 07/23/2016   Snores 06/27/2016   S/P placement of cardiac pacemaker 06/25/2016   Hemiparesis (HCC) 06/25/2016   Acute ischemic stroke (HCC)    Left-sided weakness    Hemisensory loss    Dysarthria    Stroke-like symptoms    Complete heart block (HCC)    Pain in thoracic spine    Orthostasis    Lethargy  Vertebral artery occlusion, left    TIA (transient ischemic attack) 06/14/2016   Arthritis 02/06/2015   Esophageal reflux 02/06/2015   Arthritis urica 02/06/2015   Cannot sleep 02/06/2015   Arthritis, degenerative 02/06/2015   Adenocarcinoma, renal cell (HCC) 02/06/2015   Benign prostatic hyperplasia with lower urinary tract symptoms 11/21/2014   Personal history of other malignant neoplasm of kidney 11/21/2014   History of Lyme disease 05/06/2014   Palpitations 12/31/2013   Hyperlipidemia 11/02/2010   HYPERTENSION, BENIGN 04/16/2010    ONSET DATE: 05/06/2016   REFERRING DIAG:  R20.0 (ICD-10-CM) - Anesthesia of skin  R29.90 (ICD-10-CM) - Unspecified symptoms and signs involving the nervous system  G45.9 (ICD-10-CM) - Transient cerebral ischemic attack, unspecified    THERAPY DIAG:  No diagnosis found.  Rationale for Evaluation and Treatment: Rehabilitation  SUBJECTIVE:                                                                                                                                                                                             SUBJECTIVE STATEMENT: TODAY: Patient reports he is doing okay- States  balance is his biggest concern.   From EVAL: Pt accompanied by spouse who was a Interior and spatial designer of women's care at the hospital.  Pt reported he had a stroke in 2018 and a stent placed in June of 2025.  Pt notes that after the stint being placed, he felt numbness on the L side of the body.  Pt reports some weakness in the L LE, but more of the symptoms were found to be in the UE.    Pt had a prostate surgery back in 2022, and was seeing Shin-Yiing for issues in the sacroilliac region.    Pt accompanied by: significant other  PERTINENT HISTORY: Pt has prior history of pacemaker, heart block, stints, and stroke.    PAIN:  Are you having pain? No  PRECAUTIONS: Fall and ICD/Pacemaker  RED FLAGS: Cauda equina syndrome: Yes: pt reporting some incontinence issues and numbness and tingling in the genital region.     WEIGHT BEARING RESTRICTIONS: No  FALLS: Has patient fallen in last 6 months? Yes. Number of falls 2 falls in April.  Pt fell and broke ribs back in April, but has had more near miss falls lately due to the L LE giving out.    LIVING ENVIRONMENT: Lives with: lives with their spouse Lives in: House/apartment Stairs: Yes: External: 2 steps; none Has following equipment at home: None  PLOF: Independent  PATIENT GOALS: To have a normal walking gait, and reduce pain in the low back.  Increase flexibility.  OBJECTIVE:  Note: Objective  measures were completed at Evaluation unless otherwise noted.  DIAGNOSTIC FINDINGS:    EXAM: CT HEAD WITHOUT CONTRAST  IMPRESSION: 1.  No evidence of an acute intracranial abnormality. 2. Parenchymal atrophy and chronic small vessel ischemic disease.   COGNITION: Overall cognitive status: Within functional limits for tasks assessed   SENSATION: WFL  COORDINATION: WFL   POSTURE: rounded shoulders, forward head, decreased lumbar lordosis, and increased thoracic kyphosis  LOWER EXTREMITY ROM:     Active  Right Eval Left Eval  Hip  flexion    Hip extension    Hip abduction    Hip adduction    Hip internal rotation    Hip external rotation    Knee flexion    Knee extension    Ankle dorsiflexion    Ankle plantarflexion    Ankle inversion    Ankle eversion     (Blank rows = not tested)  LOWER EXTREMITY MMT:    MMT Right Eval Left Eval  Hip flexion 5 4  Hip extension    Hip abduction 5 4-  Hip adduction 5 4-  Hip internal rotation    Hip external rotation    Knee flexion 4+ 4-  Knee extension 4 4-  Ankle dorsiflexion 5 5  Ankle plantarflexion    Ankle inversion    Ankle eversion    (Blank rows = not tested)  BED MOBILITY:  Not tested  STAIRS: Not tested  FUNCTIONAL TESTS:  5 times sit to stand: 8.80 sec  Timed up and go (TUG): 8.61  miniBest Test: TBD  Single LE Balance Test: TBD  Dual-task Goal: TBD  Functional gait assessment: TBD  PATIENT SURVEYS:  ABC scale: The Activities-Specific Balance Confidence (ABC) Scale 0% 10 20 30  40 50 60 70 80 90 100% No confidence<->completely confident  "How confident are you that you will not lose your balance or become unsteady when you . . .   Date tested 12/15/23   Walk around the house 90%  2. Walk up or down stairs 90%  3. Bend over and pick up a slipper from in front of a closet floor 90%  4. Reach for a small can off a shelf at eye level 100%  5. Stand on tip toes and reach for something above your head 100%  6. Stand on a chair and reach for something 80%  7. Sweep the floor 90%  8. Walk outside the house to a car parked in the driveway 90%  9. Get into or out of a car 80%  10. Walk across a parking lot to the mall 90%  11. Walk up or down a ramp 80%  12. Walk in a crowded mall where people rapidly walk past you 90%  13. Are bumped into by people as you walk through the mall 80%  14. Step onto or off of an escalator while you are holding onto the railing 100%  15. Step onto or off an escalator while holding onto parcels such that  you cannot hold onto the railing 80%  16. Walk outside on icy sidewalks 90%  Total: 16/16  88.75%  TREATMENT DATE: 01/07/24    NMR: SLS, Tandem   Kore balance testing:       PATIENT EDUCATION: Education details: Pt educated on role of PT and services provided during current POC, along with prognosis and information about the clinic. Person educated: Patient and Spouse Education method: Explanation Education comprehension: verbalized understanding  HOME EXERCISE PROGRAM: Access Code: [email or web address] URL: [email or web address] Date: 12/17/2023 Prepared by: Reyes London  Exercises - Sidelying Hip Abduction with Resistance at Ankle  - 3 x weekly - 3 sets - 10 reps - Clamshell with Resistance  - 3 x weekly - 3 sets - 10 reps - Tandem Walking with Counter Support  - 3 x weekly - 3 sets - 10 reps    Access Code: [email or web address] URL: [email or web address] Date: 12/15/2023 Prepared by: Sidra Simpers  Program Notes **Be sure to perform all exercises next to a countertop or sturdy piece of furniture in case you become unsteady.  Exercises - Standing Heel Raise with Support  - 1 x daily - 7 x weekly - 2 sets - 15 reps - Standing Toe Raises at Chair  - 1 x daily - 7 x weekly - 2 sets - 15 reps - Mini Squat with Counter Support  - 1 x daily - 7 x weekly - 2 sets - 15 reps - Lunge with Counter Support  - 1 x daily - 7 x weekly - 2 sets - 15 reps - Standing Tandem Balance with Counter Support  - 1 x daily - 7 x weekly - 2 sets - 2 reps - 30 hold - Standing Single Leg Stance with Counter Support  - 1 x daily - 7 x weekly - 2 sets - 2 reps - 30 hold  GOALS: Goals reviewed with patient? Yes  SHORT TERM GOALS: Target date: 01/12/2024  Pt will be independent with HEP in order to demonstrate increased ability to perform tasks related to  occupation/hobbies. Baseline: Goal status: INITIAL   LONG TERM GOALS: Target date: 03/08/2024  Pt will improve ABC by at least 8% in order to demonstrate improvement in balance confidence. Baseline: 88.75% Goal status: INITIAL   2.  Patient will increase Berg Balance score by > 6 points to demonstrate decreased fall risk during functional activities. Baseline: 12/17/2023= 48/56 Goal status: INITIAL  3.  Patient will increase FGA score by > 2 points to demonstrate decreased fall risk during functional activities. Baseline: 12/17/2023= 27/30 Goal status: Revised  4.  Patient will increase six minute walk test distance to >1400 for progression to community ambulator and improve gait ability Baseline: TBD Goal status: INITIAL    ASSESSMENT:  CLINICAL IMPRESSION: Patient is a 89 y.o. male who was seen today for physical therapy  treatment for imbalance.  *** Treatment further assessed balance today and patient did demonstrate some impairments as seen in both the BERG balance and FGA scoring 48/56 and 27/30 respectively. He had most difficulty with SLS and tandem which were added to HEP on initial evaluation and reviewed today. He was also provided some hip strengthening for HEP. He will benefit from further skilled PT services to address his balance impairment and restore functional independence and decrease risk of falling.   OBJECTIVE IMPAIRMENTS: Abnormal gait, decreased activity tolerance, decreased balance, decreased coordination, decreased endurance, decreased mobility, difficulty walking, decreased strength, decreased safety awareness, and impaired sensation.   ACTIVITY LIMITATIONS: carrying, lifting, bending, and stairs  PARTICIPATION LIMITATIONS: cleaning, community activity, and yard work  PERSONAL FACTORS: Age, Past/current experiences, and 3+  comorbidities: TIA, pacemaker, low back pain, thoracic back pain, L sided weakness are also affecting patient's functional outcome.    REHAB POTENTIAL: Good  CLINICAL DECISION MAKING: Stable/uncomplicated  EVALUATION COMPLEXITY: Low  PLAN:  PT FREQUENCY: 1-2x/week  PT DURATION: 12 weeks  PLANNED INTERVENTIONS: 97750- Physical Performance Testing, 97110-Therapeutic exercises, 97530- Therapeutic activity, [id number]- Neuromuscular re-education, 97535- Self Care, [id number]- Manual therapy, [phone]- Gait training, Joint mobilization, Spinal mobilization, and Vestibular training  PLAN FOR NEXT SESSION:  Continue with goal assessment: 6 min walk dual task goals Assess compliance with HEP KORE balance and balance training LE strengthening.    Chyrl London, PT Physical Therapist - [facility]  12/15/23, 8:50 AM

## 2024-01-07 ENCOUNTER — Ambulatory Visit

## 2024-01-13 ENCOUNTER — Ambulatory Visit: Attending: Cardiovascular Disease

## 2024-01-13 DIAGNOSIS — G8929 Other chronic pain: Secondary | ICD-10-CM | POA: Diagnosis not present

## 2024-01-13 DIAGNOSIS — R2689 Other abnormalities of gait and mobility: Secondary | ICD-10-CM | POA: Diagnosis not present

## 2024-01-13 DIAGNOSIS — M533 Sacrococcygeal disorders, not elsewhere classified: Secondary | ICD-10-CM | POA: Insufficient documentation

## 2024-01-13 DIAGNOSIS — R293 Abnormal posture: Secondary | ICD-10-CM | POA: Diagnosis not present

## 2024-01-13 DIAGNOSIS — M6281 Muscle weakness (generalized): Secondary | ICD-10-CM | POA: Insufficient documentation

## 2024-01-13 DIAGNOSIS — M545 Low back pain, unspecified: Secondary | ICD-10-CM | POA: Diagnosis not present

## 2024-01-13 DIAGNOSIS — R278 Other lack of coordination: Secondary | ICD-10-CM | POA: Diagnosis not present

## 2024-01-13 NOTE — Therapy (Signed)
 OUTPATIENT PHYSICAL THERAPY NEURO TREATMENT   Patient Name: Thomas [name] MRN: [id number] DOB:1933/05/06, 89 y.o., male Today's Date: 12/15/2023   PCP: [name], MD  REFERRING PROVIDER: Darron Deatrice LABOR, MD   END OF SESSION:   Past Medical History:  Diagnosis Date   Aortic insufficiency    a. 07/2023 Echo: Mod AI; b. 09/2023 Echo: Mild-mod AI.   Aortic valve disorders    Arthritis    Basal cell carcinoma    face, nose left shoulder, left arm (06/19/2016)   Basal cell carcinoma 09/13/2020   right temple   BBB (bundle branch block)    hx right   CAD (coronary artery disease)    a. 10/2017 Cath: mod, nonobs dzs; b. 02/2021 Cath: mod, nonobs dzs; c. 11/2022 Cath: LAD 30ost/p, 72m, D1 min irregs, LCX 65p/m, OM1 min irregs, OM2 40, OM3 nl, RCA 30p, 16m, 40d w/ 40 in side branch-->Med rx.   Chronic back pain    neck, thoracic, lower back (06/19/2016)   CKD (chronic kidney disease), stage III (HCC)    Complete heart block (HCC) 06/2016   a. 06/2016 s/p MDT PPM; b. 01/2023 s/p upgrade to MDT CRT-P (ser # [id number]).   GERD (gastroesophageal reflux disease)    Gout    Heart block    I've had type I, II Wenke before now (06/19/2016)   History of gout    History of hiatal hernia    self dx'd (06/19/2016)   Hyperlipidemia    Hypertension    Lyme disease    dx'd by me 2003; cx's showed dx 08/2015   Migraine    3-4/year (06/19/2016)   Mitral regurgitation    a. 07/2023 Echo: Mod MR.   NICM (nonischemic cardiomyopathy - in setting of RV pacing) (HCC)    a. 02/2019 Echo: EF 45-50%; b. 10/2022 Echo: EF 30-35%; c. 01/2023 CRT-P upgrade; e. 07/2023 Echo: EF 40-45%, glob HK; f. 10/2023 Echo: EF of 55 to 60% with grade 1 DD, nl RV fxn, triv MR, mild to mod AI, and mildly dilated ascending aorta at 42 mm.   PAF (paroxysmal atrial fibrillation) (HCC)    a. CHA2DS2VASc = 7--> eliquis ; b. On tikosyn .   Paroxysmal atrial flutter (HCC)    Presence of permanent cardiac pacemaker     PVC's (premature ventricular contractions)    Renal cancer, left (HCC) 2006   S/P cryotherapy   Spinal stenosis    cervical, 1 thoracic, lumbar (06/19/2016)   Squamous carcinoma    face, nose left shoulder, left arm (06/19/2016)   Stroke (HCC)    TIA (transient ischemic attack) 06/14/2016   I'm not sure that's what it was (06/25/2016)   Visit for monitoring Tikosyn  therapy 09/09/2017   Past Surgical History:  Procedure Laterality Date   ANKLE FRACTURE SURGERY Right 1967   BACK SURGERY  05/07/2020   BASAL CELL CARCINOMA EXCISION     face, nose left shoulder, left arm   BIOPSY PROSTATE  2001 & 2003   BIV UPGRADE N/A 01/10/2023   Procedure: BIV UPGRADE;  Surgeon: Fernande Elspeth BROCKS, MD;  Location: [facility];  Service: Cardiovascular;  Laterality: N/A;   CARDIAC CATHETERIZATION  1990's   CARDIOVERSION N/A 09/11/2017   Procedure: CARDIOVERSION;  Surgeon: Pietro Redell GORMAN, MD;  Location: [facility] ENDOSCOPY;  Service: Cardiovascular;  Laterality: N/A;   CORONARY STENT INTERVENTION N/A 11/10/2023   Procedure: CORONARY STENT INTERVENTION;  Surgeon: Darron Deatrice LABOR, MD;  Location: [facility] INVASIVE CV LAB;  Service: Cardiovascular;  Laterality: N/A;   FRACTURE SURGERY     HOLEP-LASER ENUCLEATION OF THE PROSTATE WITH MORCELLATION N/A 07/10/2020   Procedure: HOLEP-LASER ENUCLEATION OF THE PROSTATE WITH MORCELLATION;  Surgeon: Penne Knee, MD;  Location: [facility] ORS;  Service: Urology;  Laterality: N/A;   INGUINAL HERNIA REPAIR Left 2012   INSERT / REPLACE / REMOVE PACEMAKER  06/19/2016   LAPAROSCOPIC ABLATION RENAL MASS     LEFT HEART CATH AND CORONARY ANGIOGRAPHY Left 10/23/2017   Procedure: LEFT HEART CATH AND CORONARY ANGIOGRAPHY;  Surgeon: Darron Deatrice LABOR, MD;  Location: [facility] INVASIVE CV LAB;  Service: Cardiovascular;  Laterality: Left;   LEFT HEART CATH AND CORONARY ANGIOGRAPHY Left 02/26/2021   Procedure: LEFT HEART CATH AND CORONARY ANGIOGRAPHY;  Surgeon: Darron Deatrice LABOR, MD;  Location:  [facility] INVASIVE CV LAB;  Service: Cardiovascular;  Laterality: Left;   LEFT HEART CATH AND CORONARY ANGIOGRAPHY Left 11/10/2023   Procedure: LEFT HEART CATH AND CORONARY ANGIOGRAPHY;  Surgeon: Darron Deatrice LABOR, MD;  Location: [facility] INVASIVE CV LAB;  Service: Cardiovascular;  Laterality: Left;   PACEMAKER IMPLANT N/A 06/19/2016   Procedure: Pacemaker Implant;  Surgeon: Elspeth JAYSON Sage, MD;  Location: [facility] INVASIVE CV LAB;  Service: Cardiovascular;  Laterality: N/A;   pacemasker     PROSTATE SURGERY     RIGHT/LEFT HEART CATH AND CORONARY ANGIOGRAPHY Bilateral 11/25/2022   Procedure: RIGHT/LEFT HEART CATH AND CORONARY ANGIOGRAPHY;  Surgeon: Darron Deatrice LABOR, MD;  Location: [facility] INVASIVE CV LAB;  Service: Cardiovascular;  Laterality: Bilateral;   SQUAMOUS CELL CARCINOMA EXCISION     face, nose left shoulder, left arm   TONSILLECTOMY AND ADENOIDECTOMY     Patient Active Problem List   Diagnosis Date Noted   Angina pectoris (HCC) 11/10/2023   Weakness of left foot 10/29/2023   CKD stage 3b, GFR 30-44 ml/min (HCC) 10/29/2023   Obstructive sleep apnea 10/29/2023   Combined systolic and diastolic congestive heart failure (HCC) 10/29/2023   AKI (acute kidney injury) (HCC) 04/29/2023   UTI (urinary tract infection) 04/29/2023   Chronic systolic heart failure (HCC) 11/25/2022   Sick sinus syndrome (HCC) 02/26/2022   Closed fracture of second lumbar vertebra (HCC) 09/26/2021   Dysphagia, unspecified 09/26/2021   Heart block 09/26/2021   Heartburn 09/26/2021   History of lumbar fusion 09/26/2021   Inflammation of sacroiliac joint (HCC) 09/26/2021   Other symptoms and signs involving cognitive functions and awareness 09/26/2021   Prediabetes 09/26/2021   Low back pain 09/26/2021   Renal cell carcinoma (HCC) 09/26/2021   Essential hypertension 09/26/2021   Cerebrovascular disease 09/26/2021   Transient cerebral ischemic attack, unspecified 09/26/2021   Cerebrovascular disease, unspecified 09/26/2021    Transient ischemic attack 09/26/2021   Unstable angina (HCC)    Lumbar burst fracture (HCC) 05/04/2020   Hypogonadism male 10/10/2019   Persistent atrial fibrillation (HCC) 01/12/2019   Acute low back pain 11/04/2017   Abnormal screening cardiac CT    Visit for monitoring Tikosyn  therapy 09/09/2017   History of TIA (transient ischemic attack) 08/29/2016   Paroxysmal atrial fibrillation (HCC) 07/23/2016   CAD (coronary artery disease) 07/23/2016   Snores 06/27/2016   S/P placement of cardiac pacemaker 06/25/2016   Hemiparesis (HCC) 06/25/2016   Acute ischemic stroke (HCC)    Left-sided weakness    Hemisensory loss    Dysarthria    Stroke-like symptoms    Complete heart block (HCC)    Pain in thoracic spine    Orthostasis    Lethargy  Vertebral artery occlusion, left    TIA (transient ischemic attack) 06/14/2016   Arthritis 02/06/2015   Esophageal reflux 02/06/2015   Arthritis urica 02/06/2015   Cannot sleep 02/06/2015   Arthritis, degenerative 02/06/2015   Adenocarcinoma, renal cell (HCC) 02/06/2015   Benign prostatic hyperplasia with lower urinary tract symptoms 11/21/2014   Personal history of other malignant neoplasm of kidney 11/21/2014   History of Lyme disease 05/06/2014   Palpitations 12/31/2013   Hyperlipidemia 11/02/2010   HYPERTENSION, BENIGN 04/16/2010    ONSET DATE: 05/06/2016   REFERRING DIAG:  R20.0 (ICD-10-CM) - Anesthesia of skin  R29.90 (ICD-10-CM) - Unspecified symptoms and signs involving the nervous system  G45.9 (ICD-10-CM) - Transient cerebral ischemic attack, unspecified    THERAPY DIAG:  No diagnosis found.  Rationale for Evaluation and Treatment: Rehabilitation  SUBJECTIVE:                                                                                                                                                                                             SUBJECTIVE STATEMENT: TODAY: Patient reports that he had one fall just  prior to his vacation- injuring his back with hematomas. Denied going to any emergent care but did state went to Urgent care with imaging and no issues with hardware in his back. He endorses some soreness but expresses doing well enough to participate.   From EVAL: Pt accompanied by spouse who was a Interior and spatial designer of women's care at the hospital.  Pt reported he had a stroke in 2018 and a stent placed in June of 2025.  Pt notes that after the stint being placed, he felt numbness on the L side of the body.  Pt reports some weakness in the L LE, but more of the symptoms were found to be in the UE.    Pt had a prostate surgery back in 2022, and was seeing Shin-Yiing for issues in the sacroilliac region.    Pt accompanied by: significant other  PERTINENT HISTORY: Pt has prior history of pacemaker, heart block, stints, and stroke.    PAIN:  Are you having pain? No  PRECAUTIONS: Fall and ICD/Pacemaker  RED FLAGS: Cauda equina syndrome: Yes: pt reporting some incontinence issues and numbness and tingling in the genital region.     WEIGHT BEARING RESTRICTIONS: No  FALLS: Has patient fallen in last 6 months? Yes. Number of falls 2 falls in April.  Pt fell and broke ribs back in April, but has had more near miss falls lately due to the L LE giving out.    LIVING ENVIRONMENT: Lives with: lives with their spouse Lives in: House/apartment  Stairs: Yes: External: 2 steps; none Has following equipment at home: None  PLOF: Independent  PATIENT GOALS: To have a normal walking gait, and reduce pain in the low back.  Increase flexibility.  OBJECTIVE:  Note: Objective measures were completed at Evaluation unless otherwise noted.  DIAGNOSTIC FINDINGS:    EXAM: CT HEAD WITHOUT CONTRAST  IMPRESSION: 1.  No evidence of an acute intracranial abnormality. 2. Parenchymal atrophy and chronic small vessel ischemic disease.   COGNITION: Overall cognitive status: Within functional limits for tasks  assessed   SENSATION: WFL  COORDINATION: WFL   POSTURE: rounded shoulders, forward head, decreased lumbar lordosis, and increased thoracic kyphosis  LOWER EXTREMITY ROM:     Active  Right Eval Left Eval  Hip flexion    Hip extension    Hip abduction    Hip adduction    Hip internal rotation    Hip external rotation    Knee flexion    Knee extension    Ankle dorsiflexion    Ankle plantarflexion    Ankle inversion    Ankle eversion     (Blank rows = not tested)  LOWER EXTREMITY MMT:    MMT Right Eval Left Eval  Hip flexion 5 4  Hip extension    Hip abduction 5 4-  Hip adduction 5 4-  Hip internal rotation    Hip external rotation    Knee flexion 4+ 4-  Knee extension 4 4-  Ankle dorsiflexion 5 5  Ankle plantarflexion    Ankle inversion    Ankle eversion    (Blank rows = not tested)  BED MOBILITY:  Not tested  STAIRS: Not tested  FUNCTIONAL TESTS:  5 times sit to stand: 8.80 sec  Timed up and go (TUG): 8.61  miniBest Test: TBD  Single LE Balance Test: TBD  Dual-task Goal: TBD  Functional gait assessment: TBD  PATIENT SURVEYS:  ABC scale: The Activities-Specific Balance Confidence (ABC) Scale 0% 10 20 30  40 50 60 70 80 90 100% No confidence<->completely confident  "How confident are you that you will not lose your balance or become unsteady when you . . .   Date tested 12/15/23   Walk around the house 90%  2. Walk up or down stairs 90%  3. Bend over and pick up a slipper from in front of a closet floor 90%  4. Reach for a small can off a shelf at eye level 100%  5. Stand on tip toes and reach for something above your head 100%  6. Stand on a chair and reach for something 80%  7. Sweep the floor 90%  8. Walk outside the house to a car parked in the driveway 90%  9. Get into or out of a car 80%  10. Walk across a parking lot to the mall 90%  11. Walk up or down a ramp 80%  12. Walk in a crowded mall where people rapidly walk past you  90%  13. Are bumped into by people as you walk through the mall 80%  14. Step onto or off of an escalator while you are holding onto the railing 100%  15. Step onto or off an escalator while holding onto parcels such that you cannot hold onto the railing 80%  16. Walk outside on icy sidewalks 90%  Total: 16/16  88.75%  TREATMENT DATE: 01/13/24   NMR:  -Dynamic marching in place (standing on airex pad) No UE support x 20 reps each side (mild unsteadiness- reaching with UE)  -Dynamic Hip abd  (standing on airex pad) - x 20 reps  -Tandem standing (static) x multiple attempts on airex pad- hold 3-25 sec (progressively improved with practice)  -Single leg standing- Multiple attempts- at best 10 sec on Left and 12 sec on Right- yet typically 6-8 sec      The patient completed a Balance Baseline Test using the [facility] platform to assess static and dynamic postural control. This standardized test measures the patient's ability to maintain balance under various conditions by recording weight shifts, sway, and stability. The results provide objective data on balance deficits, asymmetries, and areas for targeted intervention. The patient tolerated the assessment well and required CGA [level/type of assistance] assistance during the test. Findings will guide treatment planning and monitor progress over time. Testing performed on 6.0 stability level for several minutes.  Results can be found under JSH in kore balance system  The patient completed a Cognitive Baseline Assessment on the [facility] platform designed to evaluate cognitive functions such as attention, processing speed, memory, and executive function in conjunction with balance and motor tasks. This integrated assessment provides objective data on the patient's cognitive-motor interaction and identifies areas needing  targeted rehabilitation. The patient completed the assessment with CGA [level/type of assistance] assistance and tolerated the testing well. Results will guide treatment planning and monitor cognitive and functional progress over time. Testing performed on 6.0 stability level for? several  minutes .?  Results can be found under JSH in kore balance system          PATIENT EDUCATION: Education details: Pt educated on role of PT and services provided during current POC, along with prognosis and information about the clinic. Person educated: Patient and Spouse Education method: Explanation Education comprehension: verbalized understanding  HOME EXERCISE PROGRAM: Access Code: [email or web address] URL: [email or web address] Date: 12/17/2023 Prepared by: Reyes London  Exercises - Sidelying Hip Abduction with Resistance at Ankle  - 3 x weekly - 3 sets - 10 reps - Clamshell with Resistance  - 3 x weekly - 3 sets - 10 reps - Tandem Walking with Counter Support  - 3 x weekly - 3 sets - 10 reps    Access Code: [email or web address] URL: [email or web address] Date: 12/15/2023 Prepared by: Sidra Simpers  Program Notes **Be sure to perform all exercises next to a countertop or sturdy piece of furniture in case you become unsteady.  Exercises - Standing Heel Raise with Support  - 1 x daily - 7 x weekly - 2 sets - 15 reps - Standing Toe Raises at Chair  - 1 x daily - 7 x weekly - 2 sets - 15 reps - Mini Squat with Counter Support  - 1 x daily - 7 x weekly - 2 sets - 15 reps - Lunge with Counter Support  - 1 x daily - 7 x weekly - 2 sets - 15 reps - Standing Tandem Balance with Counter Support  - 1 x daily - 7 x weekly - 2 sets - 2 reps - 30 hold - Standing Single Leg Stance with Counter Support  - 1 x daily - 7 x weekly - 2 sets - 2 reps - 30 hold  GOALS: Goals reviewed with patient? Yes  SHORT TERM GOALS: Target date: 01/12/2024  Pt will be independent with HEP in order to  demonstrate increased ability to perform  tasks related to occupation/hobbies. Baseline: Goal status: INITIAL   LONG TERM GOALS: Target date: 03/08/2024  Pt will improve ABC by at least 8% in order to demonstrate improvement in balance confidence. Baseline: 88.75% Goal status: INITIAL   2.  Patient will increase Berg Balance score by > 6 points to demonstrate decreased fall risk during functional activities. Baseline: 12/17/2023= 48/56 Goal status: INITIAL  3.  Patient will increase FGA score by > 2 points to demonstrate decreased fall risk during functional activities. Baseline: 12/17/2023= 27/30 Goal status: Revised  4.  Patient will increase six minute walk test distance to >1400 for progression to community ambulator and improve gait ability Baseline: TBD Goal status: INITIAL    ASSESSMENT:  CLINICAL IMPRESSION: Patient is a 89 y.o. male who was seen today for physical therapy  treatment for imbalance.  Treatment focused on balance assessment using KORE and also some activities in // bars- he was unsteady and more stiff with RLE today. He will benefit from progressive balance activities and will need to continue monitoring for any lingering back issues after reported fall.   He will benefit from further skilled PT services to address his balance impairment and restore functional independence and decrease risk of falling.   OBJECTIVE IMPAIRMENTS: Abnormal gait, decreased activity tolerance, decreased balance, decreased coordination, decreased endurance, decreased mobility, difficulty walking, decreased strength, decreased safety awareness, and impaired sensation.   ACTIVITY LIMITATIONS: carrying, lifting, bending, and stairs  PARTICIPATION LIMITATIONS: cleaning, community activity, and yard work  PERSONAL FACTORS: Age, Past/current experiences, and 3+ comorbidities: TIA, pacemaker, low back pain, thoracic back pain, L sided weakness are also affecting patient's functional outcome.    REHAB POTENTIAL: Good  CLINICAL DECISION MAKING: Stable/uncomplicated  EVALUATION COMPLEXITY: Low  PLAN:  PT FREQUENCY: 1-2x/week  PT DURATION: 12 weeks  PLANNED INTERVENTIONS: 97750- Physical Performance Testing, 97110-Therapeutic exercises, 97530- Therapeutic activity, [id number]- Neuromuscular re-education, 97535- Self Care, [id number]- Manual therapy, [phone]- Gait training, Joint mobilization, Spinal mobilization, and Vestibular training  PLAN FOR NEXT SESSION:  Continue with goal assessment: 6 min walk dual task goals Assess compliance with HEP KORE balance and balance training LE strengthening.    Chyrl London, PT Physical Therapist - [facility]  12/15/23, 8:50 AM

## 2024-01-15 ENCOUNTER — Ambulatory Visit: Admitting: Physical Therapy

## 2024-01-15 DIAGNOSIS — M6281 Muscle weakness (generalized): Secondary | ICD-10-CM | POA: Diagnosis not present

## 2024-01-15 DIAGNOSIS — G8929 Other chronic pain: Secondary | ICD-10-CM | POA: Diagnosis not present

## 2024-01-15 DIAGNOSIS — R2689 Other abnormalities of gait and mobility: Secondary | ICD-10-CM

## 2024-01-15 DIAGNOSIS — R293 Abnormal posture: Secondary | ICD-10-CM

## 2024-01-15 DIAGNOSIS — M545 Low back pain, unspecified: Secondary | ICD-10-CM | POA: Diagnosis not present

## 2024-01-15 DIAGNOSIS — M533 Sacrococcygeal disorders, not elsewhere classified: Secondary | ICD-10-CM | POA: Diagnosis not present

## 2024-01-15 DIAGNOSIS — R278 Other lack of coordination: Secondary | ICD-10-CM

## 2024-01-15 NOTE — Therapy (Signed)
 OUTPATIENT PHYSICAL THERAPY NEURO TREATMENT   Patient Name: Thomas [name] MRN: [id number] DOB:30-Jun-1932, 89 y.o., male Today's Date: 12/15/2023   PCP: [name], MD  REFERRING PROVIDER: Darron Deatrice LABOR, MD   END OF SESSION:   Past Medical History:  Diagnosis Date   Aortic insufficiency    a. 07/2023 Echo: Mod AI; b. 09/2023 Echo: Mild-mod AI.   Aortic valve disorders    Arthritis    Basal cell carcinoma    face, nose left shoulder, left arm (06/19/2016)   Basal cell carcinoma 09/13/2020   right temple   BBB (bundle branch block)    hx right   CAD (coronary artery disease)    a. 10/2017 Cath: mod, nonobs dzs; b. 02/2021 Cath: mod, nonobs dzs; c. 11/2022 Cath: LAD 30ost/p, 9m, D1 min irregs, LCX 65p/m, OM1 min irregs, OM2 40, OM3 nl, RCA 30p, 43m, 40d w/ 40 in side branch-->Med rx.   Chronic back pain    neck, thoracic, lower back (06/19/2016)   CKD (chronic kidney disease), stage III (HCC)    Complete heart block (HCC) 06/2016   a. 06/2016 s/p MDT PPM; b. 01/2023 s/p upgrade to MDT CRT-P (ser # [id number]).   GERD (gastroesophageal reflux disease)    Gout    Heart block    I've had type I, II Wenke before now (06/19/2016)   History of gout    History of hiatal hernia    self dx'd (06/19/2016)   Hyperlipidemia    Hypertension    Lyme disease    dx'd by me 2003; cx's showed dx 08/2015   Migraine    3-4/year (06/19/2016)   Mitral regurgitation    a. 07/2023 Echo: Mod MR.   NICM (nonischemic cardiomyopathy - in setting of RV pacing) (HCC)    a. 02/2019 Echo: EF 45-50%; b. 10/2022 Echo: EF 30-35%; c. 01/2023 CRT-P upgrade; e. 07/2023 Echo: EF 40-45%, glob HK; f. 10/2023 Echo: EF of 55 to 60% with grade 1 DD, nl RV fxn, triv MR, mild to mod AI, and mildly dilated ascending aorta at 42 mm.   PAF (paroxysmal atrial fibrillation) (HCC)    a. CHA2DS2VASc = 7--> eliquis ; b. On tikosyn .   Paroxysmal atrial flutter (HCC)    Presence of permanent cardiac pacemaker     PVC's (premature ventricular contractions)    Renal cancer, left (HCC) 2006   S/P cryotherapy   Spinal stenosis    cervical, 1 thoracic, lumbar (06/19/2016)   Squamous carcinoma    face, nose left shoulder, left arm (06/19/2016)   Stroke (HCC)    TIA (transient ischemic attack) 06/14/2016   I'm not sure that's what it was (06/25/2016)   Visit for monitoring Tikosyn  therapy 09/09/2017   Past Surgical History:  Procedure Laterality Date   ANKLE FRACTURE SURGERY Right 1967   BACK SURGERY  05/07/2020   BASAL CELL CARCINOMA EXCISION     face, nose left shoulder, left arm   BIOPSY PROSTATE  2001 & 2003   BIV UPGRADE N/A 01/10/2023   Procedure: BIV UPGRADE;  Surgeon: Fernande Elspeth BROCKS, MD;  Location: [facility] INVASIVE CV [facility];  Service: Cardiovascular;  Laterality: N/A;   CARDIAC CATHETERIZATION  1990's   CARDIOVERSION N/A 09/11/2017   Procedure: CARDIOVERSION;  Surgeon: Pietro Redell GORMAN, MD;  Location: [facility] ENDOSCOPY;  Service: Cardiovascular;  Laterality: N/A;   CORONARY STENT INTERVENTION N/A 11/10/2023   Procedure: CORONARY STENT INTERVENTION;  Surgeon: Darron Deatrice LABOR, MD;  Location: [facility] INVASIVE CV LAB;  Service: Cardiovascular;  Laterality: N/A;   FRACTURE SURGERY     HOLEP-LASER ENUCLEATION OF THE PROSTATE WITH MORCELLATION N/A 07/10/2020   Procedure: HOLEP-LASER ENUCLEATION OF THE PROSTATE WITH MORCELLATION;  Surgeon: Penne Knee, MD;  Location: [facility] ORS;  Service: Urology;  Laterality: N/A;   INGUINAL HERNIA REPAIR Left 2012   INSERT / REPLACE / REMOVE PACEMAKER  06/19/2016   LAPAROSCOPIC ABLATION RENAL MASS     LEFT HEART CATH AND CORONARY ANGIOGRAPHY Left 10/23/2017   Procedure: LEFT HEART CATH AND CORONARY ANGIOGRAPHY;  Surgeon: Darron Deatrice LABOR, MD;  Location: [facility] INVASIVE CV LAB;  Service: Cardiovascular;  Laterality: Left;   LEFT HEART CATH AND CORONARY ANGIOGRAPHY Left 02/26/2021   Procedure: LEFT HEART CATH AND CORONARY ANGIOGRAPHY;  Surgeon: Darron Deatrice LABOR, MD;  Location:  [facility] INVASIVE CV LAB;  Service: Cardiovascular;  Laterality: Left;   LEFT HEART CATH AND CORONARY ANGIOGRAPHY Left 11/10/2023   Procedure: LEFT HEART CATH AND CORONARY ANGIOGRAPHY;  Surgeon: Darron Deatrice LABOR, MD;  Location: [facility] INVASIVE CV LAB;  Service: Cardiovascular;  Laterality: Left;   PACEMAKER IMPLANT N/A 06/19/2016   Procedure: Pacemaker Implant;  Surgeon: Elspeth JAYSON Sage, MD;  Location: [facility] INVASIVE CV LAB;  Service: Cardiovascular;  Laterality: N/A;   pacemasker     PROSTATE SURGERY     RIGHT/LEFT HEART CATH AND CORONARY ANGIOGRAPHY Bilateral 11/25/2022   Procedure: RIGHT/LEFT HEART CATH AND CORONARY ANGIOGRAPHY;  Surgeon: Darron Deatrice LABOR, MD;  Location: [facility] INVASIVE CV LAB;  Service: Cardiovascular;  Laterality: Bilateral;   SQUAMOUS CELL CARCINOMA EXCISION     face, nose left shoulder, left arm   TONSILLECTOMY AND ADENOIDECTOMY     Patient Active Problem List   Diagnosis Date Noted   Angina pectoris (HCC) 11/10/2023   Weakness of left foot 10/29/2023   CKD stage 3b, GFR 30-44 ml/min (HCC) 10/29/2023   Obstructive sleep apnea 10/29/2023   Combined systolic and diastolic congestive heart failure (HCC) 10/29/2023   AKI (acute kidney injury) (HCC) 04/29/2023   UTI (urinary tract infection) 04/29/2023   Chronic systolic heart failure (HCC) 11/25/2022   Sick sinus syndrome (HCC) 02/26/2022   Closed fracture of second lumbar vertebra (HCC) 09/26/2021   Dysphagia, unspecified 09/26/2021   Heart block 09/26/2021   Heartburn 09/26/2021   History of lumbar fusion 09/26/2021   Inflammation of sacroiliac joint (HCC) 09/26/2021   Other symptoms and signs involving cognitive functions and awareness 09/26/2021   Prediabetes 09/26/2021   Low back pain 09/26/2021   Renal cell carcinoma (HCC) 09/26/2021   Essential hypertension 09/26/2021   Cerebrovascular disease 09/26/2021   Transient cerebral ischemic attack, unspecified 09/26/2021   Cerebrovascular disease, unspecified 09/26/2021    Transient ischemic attack 09/26/2021   Unstable angina (HCC)    Lumbar burst fracture (HCC) 05/04/2020   Hypogonadism male 10/10/2019   Persistent atrial fibrillation (HCC) 01/12/2019   Acute low back pain 11/04/2017   Abnormal screening cardiac CT    Visit for monitoring Tikosyn  therapy 09/09/2017   History of TIA (transient ischemic attack) 08/29/2016   Paroxysmal atrial fibrillation (HCC) 07/23/2016   CAD (coronary artery disease) 07/23/2016   Snores 06/27/2016   S/P placement of cardiac pacemaker 06/25/2016   Hemiparesis (HCC) 06/25/2016   Acute ischemic stroke (HCC)    Left-sided weakness    Hemisensory loss    Dysarthria    Stroke-like symptoms    Complete heart block (HCC)    Pain in thoracic spine    Orthostasis    Lethargy  Vertebral artery occlusion, left    TIA (transient ischemic attack) 06/14/2016   Arthritis 02/06/2015   Esophageal reflux 02/06/2015   Arthritis urica 02/06/2015   Cannot sleep 02/06/2015   Arthritis, degenerative 02/06/2015   Adenocarcinoma, renal cell (HCC) 02/06/2015   Benign prostatic hyperplasia with lower urinary tract symptoms 11/21/2014   Personal history of other malignant neoplasm of kidney 11/21/2014   History of Lyme disease 05/06/2014   Palpitations 12/31/2013   Hyperlipidemia 11/02/2010   HYPERTENSION, BENIGN 04/16/2010    ONSET DATE: 05/06/2016   REFERRING DIAG:  R20.0 (ICD-10-CM) - Anesthesia of skin  R29.90 (ICD-10-CM) - Unspecified symptoms and signs involving the nervous system  G45.9 (ICD-10-CM) - Transient cerebral ischemic attack, unspecified    THERAPY DIAG:  No diagnosis found.  Rationale for Evaluation and Treatment: Rehabilitation  SUBJECTIVE:                                                                                                                                                                                             SUBJECTIVE STATEMENT: TODAY: Patient reports that he had one fall just  prior to his vacation- injuring his back with hematomas. Denied going to any emergent care but did state went to Urgent care with imaging and no issues with hardware in his back. He endorses some soreness but expresses doing well enough to participate.   From EVAL: Pt accompanied by spouse who was a Interior and spatial designer of women's care at the hospital.  Pt reported he had a stroke in 2018 and a stent placed in June of 2025.  Pt notes that after the stint being placed, he felt numbness on the L side of the body.  Pt reports some weakness in the L LE, but more of the symptoms were found to be in the UE.    Pt had a prostate surgery back in 2022, and was seeing Shin-Yiing for issues in the sacroilliac region.    Pt accompanied by: significant other  PERTINENT HISTORY: Pt has prior history of pacemaker, heart block, stints, and stroke.    PAIN:  Are you having pain? No  PRECAUTIONS: Fall and ICD/Pacemaker  RED FLAGS: Cauda equina syndrome: Yes: pt reporting some incontinence issues and numbness and tingling in the genital region.     WEIGHT BEARING RESTRICTIONS: No  FALLS: Has patient fallen in last 6 months? Yes. Number of falls 2 falls in April.  Pt fell and broke ribs back in April, but has had more near miss falls lately due to the L LE giving out.    LIVING ENVIRONMENT: Lives with: lives with their spouse Lives in: House/apartment  Stairs: Yes: External: 2 steps; none Has following equipment at home: None  PLOF: Independent  PATIENT GOALS: To have a normal walking gait, and reduce pain in the low back.  Increase flexibility.  OBJECTIVE:  Note: Objective measures were completed at Evaluation unless otherwise noted.  DIAGNOSTIC FINDINGS:    EXAM: CT HEAD WITHOUT CONTRAST  IMPRESSION: 1.  No evidence of an acute intracranial abnormality. 2. Parenchymal atrophy and chronic small vessel ischemic disease.   COGNITION: Overall cognitive status: Within functional limits for tasks  assessed   SENSATION: WFL  COORDINATION: WFL   POSTURE: rounded shoulders, forward head, decreased lumbar lordosis, and increased thoracic kyphosis  LOWER EXTREMITY ROM:     Active  Right Eval Left Eval  Hip flexion    Hip extension    Hip abduction    Hip adduction    Hip internal rotation    Hip external rotation    Knee flexion    Knee extension    Ankle dorsiflexion    Ankle plantarflexion    Ankle inversion    Ankle eversion     (Blank rows = not tested)  LOWER EXTREMITY MMT:    MMT Right Eval Left Eval  Hip flexion 5 4  Hip extension    Hip abduction 5 4-  Hip adduction 5 4-  Hip internal rotation    Hip external rotation    Knee flexion 4+ 4-  Knee extension 4 4-  Ankle dorsiflexion 5 5  Ankle plantarflexion    Ankle inversion    Ankle eversion    (Blank rows = not tested)  BED MOBILITY:  Not tested  STAIRS: Not tested  FUNCTIONAL TESTS:  5 times sit to stand: 8.80 sec  Timed up and go (TUG): 8.61  miniBest Test: TBD  Single LE Balance Test: TBD  Dual-task Goal: TBD  Functional gait assessment: TBD  PATIENT SURVEYS:  ABC scale: The Activities-Specific Balance Confidence (ABC) Scale 0% 10 20 30  40 50 60 70 80 90 100% No confidence<->completely confident  "How confident are you that you will not lose your balance or become unsteady when you . . .   Date tested 12/15/23   Walk around the house 90%  2. Walk up or down stairs 90%  3. Bend over and pick up a slipper from in front of a closet floor 90%  4. Reach for a small can off a shelf at eye level 100%  5. Stand on tip toes and reach for something above your head 100%  6. Stand on a chair and reach for something 80%  7. Sweep the floor 90%  8. Walk outside the house to a car parked in the driveway 90%  9. Get into or out of a car 80%  10. Walk across a parking lot to the mall 90%  11. Walk up or down a ramp 80%  12. Walk in a crowded mall where people rapidly walk past you  90%  13. Are bumped into by people as you walk through the mall 80%  14. Step onto or off of an escalator while you are holding onto the railing 100%  15. Step onto or off an escalator while holding onto parcels such that you cannot hold onto the railing 80%  16. Walk outside on icy sidewalks 90%  Total: 16/16  88.75%  TREATMENT DATE: 01/13/24   6 Min Walk Test:  Instructed patient to ambulate as quickly and as safely as possible for 6 minutes using LRAD. Patient was allowed to take standing rest breaks without stopping the test, but if the patient required a sitting rest break the clock would be stopped and the test would be over.  Results: 1281 feet  using no AD and supervision assist. Results indicate that the patient has reduced endurance with ambulation compared to age matched norms.  Age Matched Norms: 102-69 yo M: 44 F: 70, 74-79 yo M: 58 F: 471, 70-89 yo M: 417 F: 392 MDC: 58.21 meters (190.98 feet) or 50 meters (ANPTA Core Set of Outcome Measures for Adults with Neurologic Conditions, 2018)  Narrow Airex Beam:  Static stance intermittent light UE support 2 x 45 sec  Side stepping with UE support on rail.   Wide airex beam:  Tandem gait with light UE support x  Static stance while moving magnets across board with 1 UE x 12 bil  Forward/reverse gait with no UE support 20ft x 5 each.  Tandem gait on tape x 59ft x 4 bouts. Min assist to prevent LOB to the L.  -Single leg standing 10sec total x 3 bil with min assist to prevent lateral LOB.   PATIENT EDUCATION: Education details: Pt educated on role of PT and services provided during current POC, along with prognosis and information about the clinic. Person educated: Patient and Spouse Education method: Explanation Education comprehension: verbalized understanding  HOME EXERCISE PROGRAM: Access Code:  [email or web address] URL: [email or web address] Date: 12/17/2023 Prepared by: Reyes London  Exercises - Sidelying Hip Abduction with Resistance at Ankle  - 3 x weekly - 3 sets - 10 reps - Clamshell with Resistance  - 3 x weekly - 3 sets - 10 reps - Tandem Walking with Counter Support  - 3 x weekly - 3 sets - 10 reps    Access Code: [email or web address] URL: [email or web address] Date: 12/15/2023 Prepared by: Sidra Simpers  Program Notes **Be sure to perform all exercises next to a countertop or sturdy piece of furniture in case you become unsteady.  Exercises - Standing Heel Raise with Support  - 1 x daily - 7 x weekly - 2 sets - 15 reps - Standing Toe Raises at Chair  - 1 x daily - 7 x weekly - 2 sets - 15 reps - Mini Squat with Counter Support  - 1 x daily - 7 x weekly - 2 sets - 15 reps - Lunge with Counter Support  - 1 x daily - 7 x weekly - 2 sets - 15 reps - Standing Tandem Balance with Counter Support  - 1 x daily - 7 x weekly - 2 sets - 2 reps - 30 hold - Standing Single Leg Stance with Counter Support  - 1 x daily - 7 x weekly - 2 sets - 2 reps - 30 hold  GOALS: Goals reviewed with patient? Yes  SHORT TERM GOALS: Target date: 01/12/2024  Pt will be independent with HEP in order to demonstrate increased ability to perform tasks related to occupation/hobbies. Baseline: Goal status: INITIAL   LONG TERM GOALS: Target date: 03/08/2024  Pt will improve ABC by at least 8% in order to demonstrate improvement in balance confidence. Baseline: 88.75% Goal status: INITIAL   2.  Patient will increase Berg Balance score by > 6 points to demonstrate decreased fall risk during functional activities. Baseline: 12/17/2023= 48/56 Goal status: INITIAL  3.  Patient  will increase FGA score by > 2 points to demonstrate decreased fall risk during functional activities. Baseline: 12/17/2023= 27/30 Goal status: Revised  4.  Patient will increase six minute walk test distance to  >1400 for progression to community ambulator and improve gait ability Baseline: TBD Goal status: INITIAL    ASSESSMENT:  CLINICAL IMPRESSION: Patient is a 89 y.o. male who was seen today for physical therapy  treatment for imbalance.  Completed 6 min walk test 1238ft, slightly reduced from aged matched norm of 1368 ft. Treatment focused on balance training statically and dynamically on various support surfaces.  Tolerated all interventions well with greatest difficulty in narrow support of tandem and SLS.  He will benefit from progressive balance activities and will need to continue monitoring for any lingering back issues after reported fall.   He will benefit from further skilled PT services to address his balance impairment and restore functional independence and decrease risk of falling.   OBJECTIVE IMPAIRMENTS: Abnormal gait, decreased activity tolerance, decreased balance, decreased coordination, decreased endurance, decreased mobility, difficulty walking, decreased strength, decreased safety awareness, and impaired sensation.   ACTIVITY LIMITATIONS: carrying, lifting, bending, and stairs  PARTICIPATION LIMITATIONS: cleaning, community activity, and yard work  PERSONAL FACTORS: Age, Past/current experiences, and 3+ comorbidities: TIA, pacemaker, low back pain, thoracic back pain, L sided weakness are also affecting patient's functional outcome.   REHAB POTENTIAL: Good  CLINICAL DECISION MAKING: Stable/uncomplicated  EVALUATION COMPLEXITY: Low  PLAN:  PT FREQUENCY: 1-2x/week  PT DURATION: 12 weeks  PLANNED INTERVENTIONS: 97750- Physical Performance Testing, 97110-Therapeutic exercises, 97530- Therapeutic activity, [id number]- Neuromuscular re-education, 97535- Self Care, 02859- Manual therapy, [phone]- Gait training, Joint mobilization, Spinal mobilization, and Vestibular training  PLAN FOR NEXT SESSION:   dual task goals KORE balance and balance training LE strengthening.    Massie Dollar PT, DPT  Physical Therapist - [facility]  11:08 AM 01/15/24

## 2024-01-19 NOTE — Progress Notes (Deleted)
 Electrophysiology Clinic Note    Date:  01/20/2024  Patient ID:  Jaree, Trinka 08/26/1932, MRN [id number] PCP:  Randeen Laine LABOR, MD  Cardiologist:  [name], MD   Cardiology APP:  [name], NP  Electrophysiologist:  [name], MD  Electrophysiology APP:  Kadija Cruzen, NP    ***refresh  Discussed the use of AI scribe software for clinical note transcription with the patient, who gave verbal consent to proceed.   Patient Profile    Chief Complaint: ***  History of Present Illness: DORTHY HUSTEAD is a 89 y.o. male with PMH notable for CHB s/p PPM > reduced LVEF > CRT-P, parox AFib, CAD s/p recent PCI, HTN, HLD, renal cell carcinoma, CKD - 3, OSA on CPAP; seen today for [name], MD for routine electrophysiology followup.   He last saw Dr. Fernande 05/2023 at which time he had recently been diagnosed with UTI > e.coli polynephritis > blood cultures pos for e. Coli.  In interim, he has seen gen cards providers for sternal chest burning. LHC with worsening CAD now s/p PCI w L-sided weakness post-procedurally. He last saw NP Vivienne 12/2023 where he continued to have L-sided numbness.     Since last being seen in our clinic the patient reports doing ***.  he denies chest pain, palpitations, dyspnea, PND, orthopnea, nausea, vomiting, dizziness, syncope, edema, weight gain, or early satiety.      Arrhythmia/Device History MDT dual chamber PPM, imp 2018; CHB CRT-P upgrade 01/2023    ROS:  Please see the history of present illness. All other systems are reviewed and otherwise negative.    Physical Exam    VS:  There were no vitals taken for this visit. BMI: There is no height or weight on file to calculate BMI.      Wt Readings from Last 3 Encounters:  12/18/23 157 lb 3.2 oz (71.3 kg)  11/10/23 150 lb (68 kg)  11/05/23 150 lb 9.6 oz (68.3 kg)     GEN- The patient is well appearing, alert and oriented x 3 today.   Lungs- Clear to  ausculation bilaterally, normal work of breathing.  Heart- {Blank single:19197::Regular,Irregularly irregular} rate and rhythm, no murmurs, rubs or gallops Extremities- {EDEMA LEVEL:28147::No} peripheral edema, warm, dry Skin-  *** device pocket well-healed, no tethering   Device interrogation done today and reviewed by myself:  Battery *** Lead thresholds, impedence, sensing stable *** *** episodes *** changes made today   Studies Reviewed   Previous EP, cardiology notes.    EKG is not ordered. Personal review of EKG from 12/18/2023 shows:  AV pace; QTC         LHC, 11/10/2023   Mid LAD lesion is 60% stenosed.   Prox Cx to Mid Cx lesion is 65% stenosed.   Dist RCA lesion is 40% stenosed with 40% stenosed side branch in RPAV.   Prox RCA lesion is 30% stenosed.   Mid RCA lesion is 70% stenosed.   Ost 2nd Mrg to 2nd Mrg lesion is 40% stenosed.   Ost LAD to Prox LAD lesion is 90% stenosed.   A drug-eluting stent was successfully placed using a STENT ONYX FRONTIER 3.5X15.   Post intervention, there is a 0% residual stenosis.   1.  Known borderline three-vessel coronary artery disease with progression of proximal LAD stenosis from 40% to 90%.  This is the only difference from last catheterization in 2024. 2.  Left ventricular angiography was not performed  due to chronic kidney disease.  Normal left ventricular end-diastolic pressure. 3.  Successful angioplasty and drug-eluting stent placement to the proximal LAD.  TTE, 10/29/2023 1. Left ventricular ejection fraction, by estimation, is 55 to 60%. The left ventricle has normal function. The left ventricle has no regional wall motion abnormalities. Left ventricular diastolic parameters are consistent with Grade I diastolic dysfunction (impaired relaxation).   2. Right ventricular systolic function is normal. The right ventricular size is normal.   3. The mitral valve is normal in structure. Trivial mitral valve  regurgitation. No evidence of mitral stenosis.   4. The aortic valve is calcified. Aortic valve regurgitation is mild to moderate. No aortic stenosis is present. Aortic valve mean gradient measures 7.0 mmHg. Aortic valve Vmax measures 1.92 m/s.   5. Aortic dilatation noted. There is dilatation of the ascending aorta, measuring 42 mm.   6. The inferior vena cava is normal in size with greater than 50% respiratory variability, suggesting right atrial pressure of 3 mmHg.   TTE, 07/15/2023  1. Left ventricular ejection fraction, by estimation, is 40 to 45%. Left ventricular ejection fraction by PLAX is 42 %. The left ventricle has mildly decreased function. The left ventricle demonstrates global hypokinesis. Left ventricular diastolic parameters are consistent with Grade I diastolic dysfunction (impaired relaxation).   2. Right ventricular systolic function is normal. The right ventricular size is normal. There is mildly elevated pulmonary artery systolic pressure. The estimated right ventricular systolic pressure is 42.2 mmHg.   3. Left atrial size was mildly dilated.   4. The mitral valve is normal in structure. Moderate mitral valve regurgitation. No evidence of mitral stenosis.   5. The aortic valve is normal in structure. There is mild calcification of the aortic valve. Aortic valve regurgitation is moderate. Aortic valve sclerosis/calcification is present, without any evidence of aortic stenosis. Aortic valve mean gradient measures 5.3 mmHg.   6. The inferior vena cava is normal in size with greater than 50% respiratory variability, suggesting right atrial pressure of 3 mmHg.    Assessment and Plan     #) parox Afib #) tikosyn  monitoring   #) CHB s/p PPM > reduced LVEF > CRT-P upgrade   #) CAD s/p recent PCI   #) HFimpEF   #) ***   {Are you ordering a CV Procedure (e.g. stress test, cath, DCCV, TEE, etc)?   Press F2        :[id number]}   Current medicines are reviewed at length  with the patient today.   The patient {ACTIONS; HAS/DOES NOT HAVE:19233} concerns regarding his medicines.  The following changes were made today:  {NONE DEFAULTED:18576}  Labs/ tests ordered today include: *** No orders of the defined types were placed in this encounter.    Disposition: Follow up with {EPMDS:28135::EP Team} or EP APP {EPFOLLOW UP:28173}   Signed, Chantal Needle, NP  01/20/24  8:01 AM  Electrophysiology [facility]

## 2024-01-20 ENCOUNTER — Ambulatory Visit

## 2024-01-20 ENCOUNTER — Ambulatory Visit: Admitting: Cardiology

## 2024-01-20 ENCOUNTER — Ambulatory Visit: Admitting: Occupational Therapy

## 2024-01-20 DIAGNOSIS — G8929 Other chronic pain: Secondary | ICD-10-CM | POA: Diagnosis not present

## 2024-01-20 DIAGNOSIS — R2689 Other abnormalities of gait and mobility: Secondary | ICD-10-CM

## 2024-01-20 DIAGNOSIS — R278 Other lack of coordination: Secondary | ICD-10-CM

## 2024-01-20 DIAGNOSIS — M545 Low back pain, unspecified: Secondary | ICD-10-CM

## 2024-01-20 DIAGNOSIS — R293 Abnormal posture: Secondary | ICD-10-CM

## 2024-01-20 DIAGNOSIS — M533 Sacrococcygeal disorders, not elsewhere classified: Secondary | ICD-10-CM | POA: Diagnosis not present

## 2024-01-20 DIAGNOSIS — M6281 Muscle weakness (generalized): Secondary | ICD-10-CM

## 2024-01-20 NOTE — Therapy (Signed)
 OUTPATIENT PHYSICAL THERAPY NEURO TREATMENT   Patient Name: Thomas [name] MRN: [id number] DOB:06/23/1932, 89 y.o., male Today's Date: 12/15/2023   PCP: [name], MD  REFERRING PROVIDER: Darron Deatrice LABOR, MD    PT End of Session - 01/20/24 1152     Visit Number 5    Number of Visits 25    Date for PT Re-Evaluation 03/08/24    PT Start Time 1147    PT Stop Time 1231    PT Time Calculation (min) 44 min    Equipment Utilized During Treatment Gait belt    Activity Tolerance Patient tolerated treatment well    Behavior During Therapy [facility] for tasks assessed/performed            Past Medical History:  Diagnosis Date   Aortic insufficiency    a. 07/2023 Echo: Mod AI; b. 09/2023 Echo: Mild-mod AI.   Aortic valve disorders    Arthritis    Basal cell carcinoma    face, nose left shoulder, left arm (06/19/2016)   Basal cell carcinoma 09/13/2020   right temple   BBB (bundle branch block)    hx right   CAD (coronary artery disease)    a. 10/2017 Cath: mod, nonobs dzs; b. 02/2021 Cath: mod, nonobs dzs; c. 11/2022 Cath: LAD 30ost/p, 17m, D1 min irregs, LCX 65p/m, OM1 min irregs, OM2 40, OM3 nl, RCA 30p, 38m, 40d w/ 40 in side branch-->Med rx.   Chronic back pain    neck, thoracic, lower back (06/19/2016)   CKD (chronic kidney disease), stage III (HCC)    Complete heart block (HCC) 06/2016   a. 06/2016 s/p MDT PPM; b. 01/2023 s/p upgrade to MDT CRT-P (ser # [id number]).   GERD (gastroesophageal reflux disease)    Gout    Heart block    I've had type I, II Wenke before now (06/19/2016)   History of gout    History of hiatal hernia    self dx'd (06/19/2016)   Hyperlipidemia    Hypertension    Lyme disease    dx'd by me 2003; cx's showed dx 08/2015   Migraine    3-4/year (06/19/2016)   Mitral regurgitation    a. 07/2023 Echo: Mod MR.   NICM (nonischemic cardiomyopathy - in setting of RV pacing) (HCC)    a. 02/2019 Echo: EF 45-50%; b. 10/2022 Echo: EF 30-35%; c.  01/2023 CRT-P upgrade; e. 07/2023 Echo: EF 40-45%, glob HK; f. 10/2023 Echo: EF of 55 to 60% with grade 1 DD, nl RV fxn, triv MR, mild to mod AI, and mildly dilated ascending aorta at 42 mm.   PAF (paroxysmal atrial fibrillation) (HCC)    a. CHA2DS2VASc = 7--> eliquis ; b. On tikosyn .   Paroxysmal atrial flutter (HCC)    Presence of permanent cardiac pacemaker    PVC's (premature ventricular contractions)    Renal cancer, left (HCC) 2006   S/P cryotherapy   Spinal stenosis    cervical, 1 thoracic, lumbar (06/19/2016)   Squamous carcinoma    face, nose left shoulder, left arm (06/19/2016)   Stroke (HCC)    TIA (transient ischemic attack) 06/14/2016   I'm not sure that's what it was (06/25/2016)   Visit for monitoring Tikosyn  therapy 09/09/2017   Past Surgical History:  Procedure Laterality Date   ANKLE FRACTURE SURGERY Right 1967   BACK SURGERY  05/07/2020   BASAL CELL CARCINOMA EXCISION     face, nose left shoulder, left arm   BIOPSY PROSTATE  2001 &  2003   BIV UPGRADE N/A 01/10/2023   Procedure: BIV UPGRADE;  Surgeon: Fernande Elspeth BROCKS, MD;  Location[facility] INVASIVE CV LAB;  Service: Cardiovascular;  Laterality: N/A;   CARDIAC CATHETERIZATION  1990's   CARDIOVERSION N/A 09/11/2017   Procedure: CARDIOVERSION;  Surgeon: Pietro Redell RAMAN, MD;  Location: [facility] ENDOSCOPY;  Service: Cardiovascular;  Laterality: N/A;   CORONARY STENT INTERVENTION N/A 11/10/2023   Procedure: CORONARY STENT INTERVENTION;  Surgeon: Darron Deatrice LABOR, MD;  Location: [facility] INVASIVE CV LAB;  Service: Cardiovascular;  Laterality: N/A;   FRACTURE SURGERY     HOLEP-LASER ENUCLEATION OF THE PROSTATE WITH MORCELLATION N/A 07/10/2020   Procedure: HOLEP-LASER ENUCLEATION OF THE PROSTATE WITH MORCELLATION;  Surgeon: Penne Knee, MD;  Location: [facility] ORS;  Service: Urology;  Laterality: N/A;   INGUINAL HERNIA REPAIR Left 2012   INSERT / REPLACE / REMOVE PACEMAKER  06/19/2016   LAPAROSCOPIC ABLATION RENAL MASS     LEFT HEART CATH  AND CORONARY ANGIOGRAPHY Left 10/23/2017   Procedure: LEFT HEART CATH AND CORONARY ANGIOGRAPHY;  Surgeon: Darron Deatrice LABOR, MD;  Location: [facility] INVASIVE CV LAB;  Service: Cardiovascular;  Laterality: Left;   LEFT HEART CATH AND CORONARY ANGIOGRAPHY Left 02/26/2021   Procedure: LEFT HEART CATH AND CORONARY ANGIOGRAPHY;  Surgeon: Darron Deatrice LABOR, MD;  Location: [facility] INVASIVE CV LAB;  Service: Cardiovascular;  Laterality: Left;   LEFT HEART CATH AND CORONARY ANGIOGRAPHY Left 11/10/2023   Procedure: LEFT HEART CATH AND CORONARY ANGIOGRAPHY;  Surgeon: Darron Deatrice LABOR, MD;  Location: [facility] INVASIVE CV LAB;  Service: Cardiovascular;  Laterality: Left;   PACEMAKER IMPLANT N/A 06/19/2016   Procedure: Pacemaker Implant;  Surgeon: Elspeth BROCKS Fernande, MD;  Location: [facility] INVASIVE CV LAB;  Service: Cardiovascular;  Laterality: N/A;   pacemasker     PROSTATE SURGERY     RIGHT/LEFT HEART CATH AND CORONARY ANGIOGRAPHY Bilateral 11/25/2022   Procedure: RIGHT/LEFT HEART CATH AND CORONARY ANGIOGRAPHY;  Surgeon: Darron Deatrice LABOR, MD;  Location: [facility] INVASIVE CV LAB;  Service: Cardiovascular;  Laterality: Bilateral;   SQUAMOUS CELL CARCINOMA EXCISION     face, nose left shoulder, left arm   TONSILLECTOMY AND ADENOIDECTOMY     Patient Active Problem List   Diagnosis Date Noted   Angina pectoris (HCC) 11/10/2023   Weakness of left foot 10/29/2023   CKD stage 3b, GFR 30-44 ml/min (HCC) 10/29/2023   Obstructive sleep apnea 10/29/2023   Combined systolic and diastolic congestive heart failure (HCC) 10/29/2023   AKI (acute kidney injury) (HCC) 04/29/2023   UTI (urinary tract infection) 04/29/2023   Chronic systolic heart failure (HCC) 11/25/2022   Sick sinus syndrome (HCC) 02/26/2022   Closed fracture of second lumbar vertebra (HCC) 09/26/2021   Dysphagia, unspecified 09/26/2021   Heart block 09/26/2021   Heartburn 09/26/2021   History of lumbar fusion 09/26/2021   Inflammation of sacroiliac joint (HCC) 09/26/2021    Other symptoms and signs involving cognitive functions and awareness 09/26/2021   Prediabetes 09/26/2021   Low back pain 09/26/2021   Renal cell carcinoma (HCC) 09/26/2021   Essential hypertension 09/26/2021   Cerebrovascular disease 09/26/2021   Transient cerebral ischemic attack, unspecified 09/26/2021   Cerebrovascular disease, unspecified 09/26/2021   Transient ischemic attack 09/26/2021   Unstable angina (HCC)    Lumbar burst fracture (HCC) 05/04/2020   Hypogonadism male 10/10/2019   Persistent atrial fibrillation (HCC) 01/12/2019   Acute low back pain 11/04/2017   Abnormal screening cardiac CT    Visit for monitoring Tikosyn  therapy 09/09/2017  History of TIA (transient ischemic attack) 08/29/2016   Paroxysmal atrial fibrillation (HCC) 07/23/2016   CAD (coronary artery disease) 07/23/2016   Snores 06/27/2016   S/P placement of cardiac pacemaker 06/25/2016   Hemiparesis (HCC) 06/25/2016   Acute ischemic stroke (HCC)    Left-sided weakness    Hemisensory loss    Dysarthria    Stroke-like symptoms    Complete heart block (HCC)    Pain in thoracic spine    Orthostasis    Lethargy    Vertebral artery occlusion, left    TIA (transient ischemic attack) 06/14/2016   Arthritis 02/06/2015   Esophageal reflux 02/06/2015   Arthritis urica 02/06/2015   Cannot sleep 02/06/2015   Arthritis, degenerative 02/06/2015   Adenocarcinoma, renal cell (HCC) 02/06/2015   Benign prostatic hyperplasia with lower urinary tract symptoms 11/21/2014   Personal history of other malignant neoplasm of kidney 11/21/2014   History of Lyme disease 05/06/2014   Palpitations 12/31/2013   Hyperlipidemia 11/02/2010   HYPERTENSION, BENIGN 04/16/2010    ONSET DATE: 05/06/2016   REFERRING DIAG:  R20.0 (ICD-10-CM) - Anesthesia of skin  R29.90 (ICD-10-CM) - Unspecified symptoms and signs involving the nervous system  G45.9 (ICD-10-CM) - Transient cerebral ischemic attack, unspecified    THERAPY  DIAG:  No diagnosis found.  Rationale for Evaluation and Treatment: Rehabilitation  SUBJECTIVE:                                                                                                                                                                                             SUBJECTIVE STATEMENT: TODAY: Patient reports back pain is getting better but still present- still sore and have to be careful with quick movements     From EVAL: Pt accompanied by spouse who was a Interior and spatial designer of women's care at the hospital.  Pt reported he had a stroke in 2018 and a stent placed in June of 2025.  Pt notes that after the stint being placed, he felt numbness on the L side of the body.  Pt reports some weakness in the L LE, but more of the symptoms were found to be in the UE.    Pt had a prostate surgery back in 2022, and was seeing Shin-Yiing for issues in the sacroilliac region.    Pt accompanied by: significant other  PERTINENT HISTORY: Pt has prior history of pacemaker, heart block, stints, and stroke.    PAIN:  Are you having pain? No  PRECAUTIONS: Fall and ICD/Pacemaker  RED FLAGS: Cauda equina syndrome: Yes: pt reporting some incontinence issues and numbness and tingling in the genital region.  WEIGHT BEARING RESTRICTIONS: No  FALLS: Has patient fallen in last 6 months? Yes. Number of falls 2 falls in April.  Pt fell and broke ribs back in April, but has had more near miss falls lately due to the L LE giving out.    LIVING ENVIRONMENT: Lives with: lives with their spouse Lives in: House/apartment Stairs: Yes: External: 2 steps; none Has following equipment at home: None  PLOF: Independent  PATIENT GOALS: To have a normal walking gait, and reduce pain in the low back.  Increase flexibility.  OBJECTIVE:  Note: Objective measures were completed at Evaluation unless otherwise noted.  DIAGNOSTIC FINDINGS:    EXAM: CT HEAD WITHOUT CONTRAST  IMPRESSION: 1.  No evidence of  an acute intracranial abnormality. 2. Parenchymal atrophy and chronic small vessel ischemic disease.   COGNITION: Overall cognitive status: Within functional limits for tasks assessed   SENSATION: WFL  COORDINATION: WFL   POSTURE: rounded shoulders, forward head, decreased lumbar lordosis, and increased thoracic kyphosis  LOWER EXTREMITY ROM:     Active  Right Eval Left Eval  Hip flexion    Hip extension    Hip abduction    Hip adduction    Hip internal rotation    Hip external rotation    Knee flexion    Knee extension    Ankle dorsiflexion    Ankle plantarflexion    Ankle inversion    Ankle eversion     (Blank rows = not tested)  LOWER EXTREMITY MMT:    MMT Right Eval Left Eval  Hip flexion 5 4  Hip extension    Hip abduction 5 4-  Hip adduction 5 4-  Hip internal rotation    Hip external rotation    Knee flexion 4+ 4-  Knee extension 4 4-  Ankle dorsiflexion 5 5  Ankle plantarflexion    Ankle inversion    Ankle eversion    (Blank rows = not tested)  BED MOBILITY:  Not tested  STAIRS: Not tested  FUNCTIONAL TESTS:  5 times sit to stand: 8.80 sec  Timed up and go (TUG): 8.61  miniBest Test: TBD  Single LE Balance Test: TBD  Dual-task Goal: TBD  Functional gait assessment: TBD  PATIENT SURVEYS:  ABC scale: The Activities-Specific Balance Confidence (ABC) Scale 0% 10 20 30  40 50 60 70 80 90 100% No confidence<->completely confident  "How confident are you that you will not lose your balance or become unsteady when you . . .   Date tested 12/15/23   Walk around the house 90%  2. Walk up or down stairs 90%  3. Bend over and pick up a slipper from in front of a closet floor 90%  4. Reach for a small can off a shelf at eye level 100%  5. Stand on tip toes and reach for something above your head 100%  6. Stand on a chair and reach for something 80%  7. Sweep the floor 90%  8. Walk outside the house to a car parked in the driveway 90%   9. Get into or out of a car 80%  10. Walk across a parking lot to the mall 90%  11. Walk up or down a ramp 80%  12. Walk in a crowded mall where people rapidly walk past you 90%  13. Are bumped into by people as you walk through the mall 80%  14. Step onto or off of an escalator while you are holding onto the railing 100%  15. Step onto or off an escalator while holding onto parcels such that you cannot hold onto the railing 80%  16. Walk outside on icy sidewalks 90%  Total: 16/16  88.75%                                                                                                                                 TREATMENT DATE: 01/20/24    NMR:  Dynamic high knee march (VC to perform slowly for more control with balance) - down and back- x 5 down and back.   Tandem walk on firm surface (in // bars) - fwd/bwd x 5 with mild unsteadiness and reaching for UE support  Wide airex beam:  Near Tandem gait without UE support down and back -fwd-x 6 Side stepping each direction on beam down and back x 5 (intermittent UE Support due to unsteadiness)    Standing on airex pad with dynamic chest pass to mirror/wall x 20 with feet apart then 20 with feet narrowed then 20 with feet staggered. Only 2 LOB with feet in staggered position- min A for recovery to keep from falling. No report of back pain.     Rockerboard a/p - initially attempting static stand with equal weight bearing- hold 30 sec x 3 then progress to dynamic weight shifting A/P x 20  with intermittent UE Support due to LOB Rockerboard laterally positioned to focus on L/R weight bearing- static stand x 30 sec hold x 2.    PATIENT EDUCATION: Education details: Pt educated on role of PT and services provided during current POC, along with prognosis and information about the clinic. Person educated: Patient and Spouse Education method: Explanation Education comprehension: verbalized understanding  HOME EXERCISE PROGRAM: Access  Code: [email or web address] URL: [email or web address] Date: 12/17/2023 Prepared by: Reyes London  Exercises - Sidelying Hip Abduction with Resistance at Ankle  - 3 x weekly - 3 sets - 10 reps - Clamshell with Resistance  - 3 x weekly - 3 sets - 10 reps - Tandem Walking with Counter Support  - 3 x weekly - 3 sets - 10 reps    Access Code: [email or web address] URL: [email or web address] Date: 12/15/2023 Prepared by: Sidra Simpers  Program Notes **Be sure to perform all exercises next to a countertop or sturdy piece of furniture in case you become unsteady.  Exercises - Standing Heel Raise with Support  - 1 x daily - 7 x weekly - 2 sets - 15 reps - Standing Toe Raises at Chair  - 1 x daily - 7 x weekly - 2 sets - 15 reps - Mini Squat with Counter Support  - 1 x daily - 7 x weekly - 2 sets - 15 reps - Lunge with Counter Support  - 1 x daily - 7 x weekly - 2 sets - 15 reps - Standing Tandem Balance with Counter Support  - 1 x daily - 7 x weekly - 2 sets - 2  reps - 30 hold - Standing Single Leg Stance with Counter Support  - 1 x daily - 7 x weekly - 2 sets - 2 reps - 30 hold  GOALS: Goals reviewed with patient? Yes  SHORT TERM GOALS: Target date: 01/12/2024  Pt will be independent with HEP in order to demonstrate increased ability to perform tasks related to occupation/hobbies. Baseline: Goal status: INITIAL   LONG TERM GOALS: Target date: 03/08/2024  Pt will improve ABC by at least 8% in order to demonstrate improvement in balance confidence. Baseline: 88.75% Goal status: INITIAL   2.  Patient will increase Berg Balance score by > 6 points to demonstrate decreased fall risk during functional activities. Baseline: 12/17/2023= 48/56 Goal status: INITIAL  3.  Patient will increase FGA score by > 2 points to demonstrate decreased fall risk during functional activities. Baseline: 12/17/2023= 27/30 Goal status: Revised  4.  Patient will increase six minute walk test  distance to >1400 for progression to community ambulator and improve gait ability Baseline: TBD Goal status: INITIAL    ASSESSMENT:  CLINICAL IMPRESSION: Patient is a 89 y.o. male who was seen today for physical therapy  treatment for imbalance.   Treatment continued with focus on static and dynamic balance training on various support surfaces with narrowed BOS.  Patient continues to be challenged with narrowed BOS activities with observed unsteadiness and LOB. He was also well challenged with standing on rocker board with some difficulty with ankle and hip righting reactions- mostly posterior LOB Tolerated all interventions well with greatest difficulty in narrow support of tandem and SLS.  He will benefit from progressive balance activities and will need to continue monitoring for any lingering back issues after reported fall.   He will benefit from further skilled PT services to address his balance impairment and restore functional independence and decrease risk of falling.   OBJECTIVE IMPAIRMENTS: Abnormal gait, decreased activity tolerance, decreased balance, decreased coordination, decreased endurance, decreased mobility, difficulty walking, decreased strength, decreased safety awareness, and impaired sensation.   ACTIVITY LIMITATIONS: carrying, lifting, bending, and stairs  PARTICIPATION LIMITATIONS: cleaning, community activity, and yard work  PERSONAL FACTORS: Age, Past/current experiences, and 3+ comorbidities: TIA, pacemaker, low back pain, thoracic back pain, L sided weakness are also affecting patient's functional outcome.   REHAB POTENTIAL: Good  CLINICAL DECISION MAKING: Stable/uncomplicated  EVALUATION COMPLEXITY: Low  PLAN:  PT FREQUENCY: 1-2x/week  PT DURATION: 12 weeks  PLANNED INTERVENTIONS: 97750- Physical Performance Testing, 97110-Therapeutic exercises, 97530- Therapeutic activity, [id number]- Neuromuscular re-education, 97535- Self Care, [id number]- Manual therapy, [phone]-  Gait training, Joint mobilization, Spinal mobilization, and Vestibular training  PLAN FOR NEXT SESSION:  High level dynamic balance activities.  dual task balance activities Progressive LE strengthening.   Chyrl London, PT Physical Therapist - [facility]  9:12 PM 01/20/24

## 2024-01-20 NOTE — Therapy (Addendum)
 OUTPATIENT OCCUPATIONAL THERAPY NEURO EVALUATION  Patient Name: Thomas [name] MRN: [id number] DOB:11/07/1932, 89 y.o., male Today's Date: 01/20/2024  PCP: Charlie Forte REFERRING PROVIDER: [name]  END OF SESSION:  OT End of Session - 01/20/24 1652     Visit Number 3    Number of Visits 24    Date for OT Re-Evaluation 03/08/24    OT Start Time 1015    OT Stop Time 1100    OT Time Calculation (min) 45 min    Activity Tolerance Patient tolerated treatment well    Behavior During Therapy [facility] for tasks assessed/performed          Past Medical History:  Diagnosis Date   Aortic insufficiency    a. 07/2023 Echo: Mod AI; b. 09/2023 Echo: Mild-mod AI.   Aortic valve disorders    Arthritis    Basal cell carcinoma    face, nose left shoulder, left arm (06/19/2016)   Basal cell carcinoma 09/13/2020   right temple   BBB (bundle branch block)    hx right   CAD (coronary artery disease)    a. 10/2017 Cath: mod, nonobs dzs; b. 02/2021 Cath: mod, nonobs dzs; c. 11/2022 Cath: LAD 30ost/p, 5m, D1 min irregs, LCX 65p/m, OM1 min irregs, OM2 40, OM3 nl, RCA 30p, 60m, 40d w/ 40 in side branch-->Med rx; d. 11/2023 PCI: LAD 90ost/p (3.5x15 Onyx Frontier DES). Otw stable anatomy.   Chronic back pain    neck, thoracic, lower back (06/19/2016)   CKD (chronic kidney disease), stage III (HCC)    Complete heart block (HCC) 06/2016   a. 06/2016 s/p MDT PPM; b. 01/2023 s/p upgrade to MDT CRT-P (ser # [id number]).   GERD (gastroesophageal reflux disease)    Gout    Heart block    I've had type I, II Wenke before now (06/19/2016)   History of gout    History of hiatal hernia    self dx'd (06/19/2016)   Hyperlipidemia    Hypertension    Lyme disease    dx'd by me 2003; cx's showed dx 08/2015   Migraine    3-4/year (06/19/2016)   Mitral regurgitation    a. 07/2023 Echo: Mod MR.   NICM (nonischemic cardiomyopathy - in setting of RV pacing) (HCC)    a. 02/2019 Echo: EF 45-50%; b. 10/2022  Echo: EF 30-35%; c. 01/2023 CRT-P upgrade; e. 07/2023 Echo: EF 40-45%, glob HK; f. 10/2023 Echo: EF of 55 to 60% with grade 1 DD, nl RV fxn, triv MR, mild to mod AI, and mildly dilated ascending aorta at 42 mm.   PAF (paroxysmal atrial fibrillation) (HCC)    a. CHA2DS2VASc = 7--> eliquis ; b. On tikosyn .   Paroxysmal atrial flutter (HCC)    Presence of permanent cardiac pacemaker    PVC's (premature ventricular contractions)    Renal cancer, left (HCC) 2006   S/P cryotherapy   Spinal stenosis    cervical, 1 thoracic, lumbar (06/19/2016)   Squamous carcinoma    face, nose left shoulder, left arm (06/19/2016)   Stroke (HCC)    TIA (transient ischemic attack) 06/14/2016   I'm not sure that's what it was (06/25/2016)   Visit for monitoring Tikosyn  therapy 09/09/2017   Past Surgical History:  Procedure Laterality Date   ANKLE FRACTURE SURGERY Right 1967   BACK SURGERY  05/07/2020   BASAL CELL CARCINOMA EXCISION     face, nose left shoulder, left arm   BIOPSY PROSTATE  2001 & 2003   BIV  UPGRADE N/A 01/10/2023   Procedure: BIV UPGRADE;  Surgeon: Fernande Elspeth BROCKS, MD;  Location: [facility] INVASIVE CV LAB;  Service: Cardiovascular;  Laterality: N/A;   CARDIAC CATHETERIZATION  1990's   CARDIOVERSION N/A 09/11/2017   Procedure: CARDIOVERSION;  Surgeon: Pietro Redell RAMAN, MD;  Location: [facility] ENDOSCOPY;  Service: Cardiovascular;  Laterality: N/A;   CORONARY STENT INTERVENTION N/A 11/10/2023   Procedure: CORONARY STENT INTERVENTION;  Surgeon: Darron Deatrice LABOR, MD;  Location: [facility] INVASIVE CV LAB;  Service: Cardiovascular;  Laterality: N/A;   FRACTURE SURGERY     HOLEP-LASER ENUCLEATION OF THE PROSTATE WITH MORCELLATION N/A 07/10/2020   Procedure: HOLEP-LASER ENUCLEATION OF THE PROSTATE WITH MORCELLATION;  Surgeon: Penne Knee, MD;  Location: [facility] ORS;  Service: Urology;  Laterality: N/A;   INGUINAL HERNIA REPAIR Left 2012   INSERT / REPLACE / REMOVE PACEMAKER  06/19/2016   LAPAROSCOPIC ABLATION RENAL MASS      LEFT HEART CATH AND CORONARY ANGIOGRAPHY Left 10/23/2017   Procedure: LEFT HEART CATH AND CORONARY ANGIOGRAPHY;  Surgeon: Darron Deatrice LABOR, MD;  Location: [facility] INVASIVE CV LAB;  Service: Cardiovascular;  Laterality: Left;   LEFT HEART CATH AND CORONARY ANGIOGRAPHY Left 02/26/2021   Procedure: LEFT HEART CATH AND CORONARY ANGIOGRAPHY;  Surgeon: Darron Deatrice LABOR, MD;  Location: [facility] INVASIVE CV LAB;  Service: Cardiovascular;  Laterality: Left;   LEFT HEART CATH AND CORONARY ANGIOGRAPHY Left 11/10/2023   Procedure: LEFT HEART CATH AND CORONARY ANGIOGRAPHY;  Surgeon: Darron Deatrice LABOR, MD;  Location: [facility] INVASIVE CV LAB;  Service: Cardiovascular;  Laterality: Left;   PACEMAKER IMPLANT N/A 06/19/2016   Procedure: Pacemaker Implant;  Surgeon: Elspeth BROCKS Fernande, MD;  Location: [facility] INVASIVE CV LAB;  Service: Cardiovascular;  Laterality: N/A;   pacemasker     PROSTATE SURGERY     RIGHT/LEFT HEART CATH AND CORONARY ANGIOGRAPHY Bilateral 11/25/2022   Procedure: RIGHT/LEFT HEART CATH AND CORONARY ANGIOGRAPHY;  Surgeon: Darron Deatrice LABOR, MD;  Location: [facility] INVASIVE CV LAB;  Service: Cardiovascular;  Laterality: Bilateral;   SQUAMOUS CELL CARCINOMA EXCISION     face, nose left shoulder, left arm   TONSILLECTOMY AND ADENOIDECTOMY     Patient Active Problem List   Diagnosis Date Noted   Angina pectoris (HCC) 11/10/2023   Weakness of left foot 10/29/2023   CKD stage 3b, GFR 30-44 ml/min (HCC) 10/29/2023   Obstructive sleep apnea 10/29/2023   Combined systolic and diastolic congestive heart failure (HCC) 10/29/2023   AKI (acute kidney injury) (HCC) 04/29/2023   UTI (urinary tract infection) 04/29/2023   Chronic systolic heart failure (HCC) 11/25/2022   Sick sinus syndrome (HCC) 02/26/2022   Closed fracture of second lumbar vertebra (HCC) 09/26/2021   Dysphagia, unspecified 09/26/2021   Heart block 09/26/2021   Heartburn 09/26/2021   History of lumbar fusion 09/26/2021   Inflammation of sacroiliac  joint (HCC) 09/26/2021   Other symptoms and signs involving cognitive functions and awareness 09/26/2021   Prediabetes 09/26/2021   Low back pain 09/26/2021   Renal cell carcinoma (HCC) 09/26/2021   Essential hypertension 09/26/2021   Cerebrovascular disease 09/26/2021   Transient cerebral ischemic attack, unspecified 09/26/2021   Cerebrovascular disease, unspecified 09/26/2021   Transient ischemic attack 09/26/2021   Unstable angina (HCC)    Lumbar burst fracture (HCC) 05/04/2020   Hypogonadism male 10/10/2019   Persistent atrial fibrillation (HCC) 01/12/2019   Acute low back pain 11/04/2017   Abnormal screening cardiac CT    Visit for monitoring Tikosyn  therapy 09/09/2017   History of TIA (transient  ischemic attack) 08/29/2016   Paroxysmal atrial fibrillation (HCC) 07/23/2016   CAD (coronary artery disease) 07/23/2016   Snores 06/27/2016   S/P placement of cardiac pacemaker 06/25/2016   Hemiparesis (HCC) 06/25/2016   Acute ischemic stroke (HCC)    Left-sided weakness    Hemisensory loss    Dysarthria    Stroke-like symptoms    Complete heart block (HCC)    Pain in thoracic spine    Orthostasis    Lethargy    Vertebral artery occlusion, left    TIA (transient ischemic attack) 06/14/2016   Arthritis 02/06/2015   Esophageal reflux 02/06/2015   Arthritis urica 02/06/2015   Cannot sleep 02/06/2015   Arthritis, degenerative 02/06/2015   Adenocarcinoma, renal cell (HCC) 02/06/2015   Benign prostatic hyperplasia with lower urinary tract symptoms 11/21/2014   Personal history of other malignant neoplasm of kidney 11/21/2014   History of Lyme disease 05/06/2014   Palpitations 12/31/2013   Hyperlipidemia 11/02/2010   HYPERTENSION, BENIGN 04/16/2010    ONSET DATE: 11/10/23  REFERRING DIAG: CVA  THERAPY DIAG:  Muscle weakness (generalized)  Other lack of coordination  Rationale for Evaluation and Treatment: Rehabilitation  SUBJECTIVE:   SUBJECTIVE STATEMENT: Pt.  reports having a had nice time on their extended trip to the beach. Pt accompanied by: self and significant other  PERTINENT HISTORY: 89 year old male who was admitted to hospital on 11/10/23 for management of unstable angina after pt developed clumsiness of the left arm after cardiac catheterization 7/7, workup for TIA. Per neurology note symptoms secondary to relative cerebral hypoperfusion in the setting of heart disease and right carotid artery and M1 stenosis. PMhx: PAF on Eliquis , HTN, HLD, prediabetes, OSA on CPAP, dominant left vertebral artery occlusion, TIA, SSS s/p PPM, BPH, renal CA, lumbar fusion, A-fib.  PRECAUTIONS: None  WEIGHT BEARING RESTRICTIONS: No  PAIN:  Are you having pain? 3/10 left shoulder-discomfort, attributes it to weakness  FALLS: Has patient fallen in last 6 months? Yes. Number of falls 4  LIVING ENVIRONMENT: Lives with: lives with their spouse Lives in: House/apartment Stairs: Yes: Internal: 2 steps; none Has following equipment at home: Single point cane and Walker - 2 wheeled  PLOF: Independent with basic ADLs  PATIENT GOALS: to go to [facility] for more detailed neuro testing and return to function in LUE  OBJECTIVE:  Note: Objective measures were completed at Evaluation unless otherwise noted.  HAND DOMINANCE: Right  ADLs: Overall ADLs: MIN A Transfers/ambulation related to ADLs: Eating: IND - uses dominant R Grooming: SETUP UB Dressing: SETUP - buttons assist LB Dressing: MIN - hiking pants  Toileting: IND Bathing: IND Tub Shower transfers: IND Equipment: none  IADLs: Shopping: spouse completes as needed Light housekeeping: spouse completes as needed Meal Prep: assist from spouse Community mobility: SPC/RW as needed Medication management: IND Landscape architect: IND Handwriting: 100% legible R hand  MOBILITY STATUS: Independent and Hx of falls  POSTURE COMMENTS:  rounded shoulders and forward head Sitting balance: Sits without UE  support up to 30 sec  ACTIVITY TOLERANCE: Activity tolerance: IND - tolerates 20 min standing activities   FUNCTIONAL OUTCOME MEASURES:    UPPER EXTREMITY ROM:   BUE AROM WFL   UPPER EXTREMITY MMT:     MMT Right eval Left eval  Shoulder flexion    Shoulder abduction    Shoulder adduction    Shoulder extension    Shoulder internal rotation    Shoulder external rotation    Middle trapezius    Lower trapezius  Elbow flexion    Elbow extension    Wrist flexion    Wrist extension    Wrist ulnar deviation    Wrist radial deviation    Wrist pronation    Wrist supination    (Blank rows = not tested)  HAND FUNCTION: Grip strength: Right: 76 lbs; Left: 55 lbs, Lateral pinch: Right: 15 lbs, Left: 11 lbs, and 3 point pinch: Right: 21 lbs, Left: 14 lbs  COORDINATION: 9 Hole Peg test: Right: 26 sec; Left: 10 sec  SENSATION:  12/17/23 Light touch: decreased sensation and numbness in L hand 5th digit Stereognosis:   Hot/Cold:   Proprioception: intact  EDEMA: mild swelling to L hand   COGNITION: Overall cognitive status: Within functional limits for tasks assessed  VISION: Subjective report: baseline Baseline vision: No visual deficits  VISION ASSESSMENT: WFL                                                                                                                            TREATMENT DATE: 01/20/24   Therapeutic Exercise:   -UE strengthening through reciprocal motion using the SciFit for 8 min. On level 4.0 with constant monitoring of the BUEs. Pt. Worked on changing, and alternating forward reverse position every 2 min.  Therapeutic Activity:  Progressive left gross grip strengthening with  17.9# of grip strength resistive force.  -Left lateral, and 3pt. Pinch strengthening using yellow, red, and green level resistive clips  -Incorporated a reaching component through multiple planes with 1.5# weight in place in combination with the task to further  challenge distal motor control while moving the proximal UE.   PATIENT EDUCATION: Education details: HEP, OT role Person educated: Patient and Spouse Education method: Explanation, Demonstration, and Handouts Education comprehension: verbalized understanding  HOME EXERCISE PROGRAM: Blue theraputty [facility] - coins at tabletop    GOALS: Goals reviewed with patient? Yes  SHORT TERM GOALS: Target date: 01/26/24  Pt will IND complete HEP for functional strenghening of non-dominant LUE. Baseline: not initiated Goal status: INITIAL   LONG TERM GOALS: Target date: 03/08/24  Pt will increase L pinch strength by 5 # of force to securely hold a wrench during leisure activities. Baseline: L pinch 14# Goal status: INITIAL  2.  Pt will increase L grip strength by 15# to wash dishes without dropping items.  Baseline: L grip 55# Goal status: INITIAL  3.  Pt will improve LUE coordination by 30 seconds to independently manipulate fasteners during dressing tasks. Baseline: L 1'10.  Goal status: INITIAL  4.  Pt will improve dynamic seated balance to don B shoes independently on edge of bed without loss of balance. Baseline: requires chair with armrest to don shoes with standby assist Goal status: INITIAL   ASSESSMENT:  CLINICAL IMPRESSION:  Pt. was late for the therapy session this morning, and reports that he didn't realized that he also has a PT appointment today. Upon arrival, Pt. reports a weakness pain in the  left shoulder. Pt. tolerated all exercises with the left UE, and hand well today. Pt. continues to present with weakness, and decreased motor control in the left UE, and hand. Pt. continues to benefit from OT services to work on L grip and lateral pinch strength, L FMC skills, and dynamic sitting balance to improve functional independence of ADL/IADL tasks at home.   PERFORMANCE DEFICITS: in functional skills including ADLs, IADLs, coordination, dexterity, sensation, and edema and  psychosocial skills including environmental adaptation.   IMPAIRMENTS: are limiting patient from ADLs, IADLs, and leisure.   CO-MORBIDITIES: may have co-morbidities  that affects occupational performance. Patient will benefit from skilled OT to address above impairments and improve overall function.  MODIFICATION OR ASSISTANCE TO COMPLETE EVALUATION: No modification of tasks or assist necessary to complete an evaluation.  OT OCCUPATIONAL PROFILE AND HISTORY: Problem focused assessment: Including review of records relating to presenting problem.  CLINICAL DECISION MAKING: LOW - limited treatment options, no task modification necessary  REHAB POTENTIAL: Good  EVALUATION COMPLEXITY: Moderate    PLAN:  OT FREQUENCY: 1-2x/week  OT DURATION: 12 weeks  PLANNED INTERVENTIONS: 97168 OT Re-evaluation, 97535 self care/ADL training, 02889 therapeutic exercise, 97530 therapeutic activity, and 97112 neuromuscular re-education  RECOMMENDED OTHER SERVICES: PT  CONSULTED AND AGREED WITH PLAN OF CARE: Patient and family member/caregiver  PLAN FOR NEXT SESSION: sensation evaluation  Richardson Otter, MS, OTR/L   01/20/2024, 4:54 PM

## 2024-01-23 ENCOUNTER — Ambulatory Visit: Admitting: Physical Therapy

## 2024-01-23 NOTE — Therapy (Deleted)
 OUTPATIENT PHYSICAL THERAPY NEURO TREATMENT   Patient Name: Thomas [name] MRN: [id number] DOB:[birth year], 89 y.o., male Today's Date: 12/15/2023   PCP: [name], MD  REFERRING PROVIDER: Darron Deatrice LABOR, MD        Past Medical History:  Diagnosis Date   Aortic insufficiency    a. 07/2023 Echo: Mod AI; b. 09/2023 Echo: Mild-mod AI.   Aortic valve disorders    Arthritis    Basal cell carcinoma    face, nose left shoulder, left arm (06/19/2016)   Basal cell carcinoma 09/13/2020   right temple   BBB (bundle branch block)    hx right   CAD (coronary artery disease)    a. 10/2017 Cath: mod, nonobs dzs; b. 02/2021 Cath: mod, nonobs dzs; c. 11/2022 Cath: LAD 30ost/p, 98m, D1 min irregs, LCX 65p/m, OM1 min irregs, OM2 40, OM3 nl, RCA 30p, 71m, 40d w/ 40 in side branch-->Med rx.   Chronic back pain    neck, thoracic, lower back (06/19/2016)   CKD (chronic kidney disease), stage III (HCC)    Complete heart block (HCC) 06/2016   a. 06/2016 s/p MDT PPM; b. 01/2023 s/p upgrade to MDT CRT-P (ser # [id number]).   GERD (gastroesophageal reflux disease)    Gout    Heart block    I've had type I, II Wenke before now (06/19/2016)   History of gout    History of hiatal hernia    self dx'd (06/19/2016)   Hyperlipidemia    Hypertension    Lyme disease    dx'd by me 2003; cx's showed dx 08/2015   Migraine    3-4/year (06/19/2016)   Mitral regurgitation    a. 07/2023 Echo: Mod MR.   NICM (nonischemic cardiomyopathy - in setting of RV pacing) (HCC)    a. 02/2019 Echo: EF 45-50%; b. 10/2022 Echo: EF 30-35%; c. 01/2023 CRT-P upgrade; e. 07/2023 Echo: EF 40-45%, glob HK; f. 10/2023 Echo: EF of 55 to 60% with grade 1 DD, nl RV fxn, triv MR, mild to mod AI, and mildly dilated ascending aorta at 42 mm.   PAF (paroxysmal atrial fibrillation) (HCC)    a. CHA2DS2VASc = 7--> eliquis ; b. On tikosyn .   Paroxysmal atrial flutter (HCC)    Presence of permanent cardiac pacemaker    PVC's  (premature ventricular contractions)    Renal cancer, left (HCC) 2006   S/P cryotherapy   Spinal stenosis    cervical, 1 thoracic, lumbar (06/19/2016)   Squamous carcinoma    face, nose left shoulder, left arm (06/19/2016)   Stroke (HCC)    TIA (transient ischemic attack) 06/14/2016   I'm not sure that's what it was (06/25/2016)   Visit for monitoring Tikosyn  therapy 09/09/2017   Past Surgical History:  Procedure Laterality Date   ANKLE FRACTURE SURGERY Right 1967   BACK SURGERY  05/07/2020   BASAL CELL CARCINOMA EXCISION     face, nose left shoulder, left arm   BIOPSY PROSTATE  2001 & 2003   BIV UPGRADE N/A 01/10/2023   Procedure: BIV UPGRADE;  Surgeon: Fernande Elspeth BROCKS, MD;  Location: [facility] INVASIVE CV [facility];  Service: Cardiovascular;  Laterality: N/A;   CARDIAC CATHETERIZATION  1990's   CARDIOVERSION N/A 09/11/2017   Procedure: CARDIOVERSION;  Surgeon: Pietro Redell GORMAN, MD;  Location: [facility] ENDOSCOPY;  Service: Cardiovascular;  Laterality: N/A;   CORONARY STENT INTERVENTION N/A 11/10/2023   Procedure: CORONARY STENT INTERVENTION;  Surgeon: Darron Deatrice LABOR, MD;  Location: [facility] INVASIVE CV LAB;  Service: Cardiovascular;  Laterality: N/A;   FRACTURE SURGERY     HOLEP-LASER ENUCLEATION OF THE PROSTATE WITH MORCELLATION N/A 07/10/2020   Procedure: HOLEP-LASER ENUCLEATION OF THE PROSTATE WITH MORCELLATION;  Surgeon: Penne Knee, MD;  Location: [facility] ORS;  Service: Urology;  Laterality: N/A;   INGUINAL HERNIA REPAIR Left 2012   INSERT / REPLACE / REMOVE PACEMAKER  06/19/2016   LAPAROSCOPIC ABLATION RENAL MASS     LEFT HEART CATH AND CORONARY ANGIOGRAPHY Left 10/23/2017   Procedure: LEFT HEART CATH AND CORONARY ANGIOGRAPHY;  Surgeon: Darron Deatrice LABOR, MD;  Location: [facility] INVASIVE CV LAB;  Service: Cardiovascular;  Laterality: Left;   LEFT HEART CATH AND CORONARY ANGIOGRAPHY Left 02/26/2021   Procedure: LEFT HEART CATH AND CORONARY ANGIOGRAPHY;  Surgeon: Darron Deatrice LABOR, MD;  Location: [facility]  INVASIVE CV LAB;  Service: Cardiovascular;  Laterality: Left;   LEFT HEART CATH AND CORONARY ANGIOGRAPHY Left 11/10/2023   Procedure: LEFT HEART CATH AND CORONARY ANGIOGRAPHY;  Surgeon: Darron Deatrice LABOR, MD;  Location: [facility] INVASIVE CV LAB;  Service: Cardiovascular;  Laterality: Left;   PACEMAKER IMPLANT N/A 06/19/2016   Procedure: Pacemaker Implant;  Surgeon: Elspeth JAYSON Sage, MD;  Location: [facility] INVASIVE CV LAB;  Service: Cardiovascular;  Laterality: N/A;   pacemasker     PROSTATE SURGERY     RIGHT/LEFT HEART CATH AND CORONARY ANGIOGRAPHY Bilateral 11/25/2022   Procedure: RIGHT/LEFT HEART CATH AND CORONARY ANGIOGRAPHY;  Surgeon: Darron Deatrice LABOR, MD;  Location: [facility] INVASIVE CV LAB;  Service: Cardiovascular;  Laterality: Bilateral;   SQUAMOUS CELL CARCINOMA EXCISION     face, nose left shoulder, left arm   TONSILLECTOMY AND ADENOIDECTOMY     Patient Active Problem List   Diagnosis Date Noted   Angina pectoris (HCC) 11/10/2023   Weakness of left foot 10/29/2023   CKD stage 3b, GFR 30-44 ml/min (HCC) 10/29/2023   Obstructive sleep apnea 10/29/2023   Combined systolic and diastolic congestive heart failure (HCC) 10/29/2023   AKI (acute kidney injury) (HCC) 04/29/2023   UTI (urinary tract infection) 04/29/2023   Chronic systolic heart failure (HCC) 11/25/2022   Sick sinus syndrome (HCC) 02/26/2022   Closed fracture of second lumbar vertebra (HCC) 09/26/2021   Dysphagia, unspecified 09/26/2021   Heart block 09/26/2021   Heartburn 09/26/2021   History of lumbar fusion 09/26/2021   Inflammation of sacroiliac joint (HCC) 09/26/2021   Other symptoms and signs involving cognitive functions and awareness 09/26/2021   Prediabetes 09/26/2021   Low back pain 09/26/2021   Renal cell carcinoma (HCC) 09/26/2021   Essential hypertension 09/26/2021   Cerebrovascular disease 09/26/2021   Transient cerebral ischemic attack, unspecified 09/26/2021   Cerebrovascular disease, unspecified 09/26/2021    Transient ischemic attack 09/26/2021   Unstable angina (HCC)    Lumbar burst fracture (HCC) 05/04/2020   Hypogonadism male 10/10/2019   Persistent atrial fibrillation (HCC) 01/12/2019   Acute low back pain 11/04/2017   Abnormal screening cardiac CT    Visit for monitoring Tikosyn  therapy 09/09/2017   History of TIA (transient ischemic attack) 08/29/2016   Paroxysmal atrial fibrillation (HCC) 07/23/2016   CAD (coronary artery disease) 07/23/2016   Snores 06/27/2016   S/P placement of cardiac pacemaker 06/25/2016   Hemiparesis (HCC) 06/25/2016   Acute ischemic stroke (HCC)    Left-sided weakness    Hemisensory loss    Dysarthria    Stroke-like symptoms    Complete heart block (HCC)    Pain in thoracic spine    Orthostasis    Lethargy  Vertebral artery occlusion, left    TIA (transient ischemic attack) 06/14/2016   Arthritis 02/06/2015   Esophageal reflux 02/06/2015   Arthritis urica 02/06/2015   Cannot sleep 02/06/2015   Arthritis, degenerative 02/06/2015   Adenocarcinoma, renal cell (HCC) 02/06/2015   Benign prostatic hyperplasia with lower urinary tract symptoms 11/21/2014   Personal history of other malignant neoplasm of kidney 11/21/2014   History of Lyme disease 05/06/2014   Palpitations 12/31/2013   Hyperlipidemia 11/02/2010   HYPERTENSION, BENIGN 04/16/2010    ONSET DATE: 05/06/2016   REFERRING DIAG:  R20.0 (ICD-10-CM) - Anesthesia of skin  R29.90 (ICD-10-CM) - Unspecified symptoms and signs involving the nervous system  G45.9 (ICD-10-CM) - Transient cerebral ischemic attack, unspecified    THERAPY DIAG:  No diagnosis found.  Rationale for Evaluation and Treatment: Rehabilitation  SUBJECTIVE:                                                                                                                                                                                             SUBJECTIVE STATEMENT: TODAY: Patient reports back pain is getting better but  still present- still sore and have to be careful with quick movements     From EVAL: Pt accompanied by spouse who was a Interior and spatial designer of women's care at the hospital.  Pt reported he had a stroke in 2018 and a stent placed in June of 2025.  Pt notes that after the stint being placed, he felt numbness on the L side of the body.  Pt reports some weakness in the L LE, but more of the symptoms were found to be in the UE.    Pt had a prostate surgery back in 2022, and was seeing Shin-Yiing for issues in the sacroilliac region.    Pt accompanied by: significant other  PERTINENT HISTORY: Pt has prior history of pacemaker, heart block, stints, and stroke.    PAIN:  Are you having pain? No  PRECAUTIONS: Fall and ICD/Pacemaker  RED FLAGS: Cauda equina syndrome: Yes: pt reporting some incontinence issues and numbness and tingling in the genital region.     WEIGHT BEARING RESTRICTIONS: No  FALLS: Has patient fallen in last 6 months? Yes. Number of falls 2 falls in April.  Pt fell and broke ribs back in April, but has had more near miss falls lately due to the L LE giving out.    LIVING ENVIRONMENT: Lives with: lives with their spouse Lives in: House/apartment Stairs: Yes: External: 2 steps; none Has following equipment at home: None  PLOF: Independent  PATIENT GOALS: To have a normal walking gait, and reduce pain in the  low back.  Increase flexibility.  OBJECTIVE:  Note: Objective measures were completed at Evaluation unless otherwise noted.  DIAGNOSTIC FINDINGS:    EXAM: CT HEAD WITHOUT CONTRAST  IMPRESSION: 1.  No evidence of an acute intracranial abnormality. 2. Parenchymal atrophy and chronic small vessel ischemic disease.   COGNITION: Overall cognitive status: Within functional limits for tasks assessed   SENSATION: WFL  COORDINATION: WFL   POSTURE: rounded shoulders, forward head, decreased lumbar lordosis, and increased thoracic kyphosis  LOWER EXTREMITY ROM:      Active  Right Eval Left Eval  Hip flexion    Hip extension    Hip abduction    Hip adduction    Hip internal rotation    Hip external rotation    Knee flexion    Knee extension    Ankle dorsiflexion    Ankle plantarflexion    Ankle inversion    Ankle eversion     (Blank rows = not tested)  LOWER EXTREMITY MMT:    MMT Right Eval Left Eval  Hip flexion 5 4  Hip extension    Hip abduction 5 4-  Hip adduction 5 4-  Hip internal rotation    Hip external rotation    Knee flexion 4+ 4-  Knee extension 4 4-  Ankle dorsiflexion 5 5  Ankle plantarflexion    Ankle inversion    Ankle eversion    (Blank rows = not tested)  BED MOBILITY:  Not tested  STAIRS: Not tested  FUNCTIONAL TESTS:  5 times sit to stand: 8.80 sec  Timed up and go (TUG): 8.61  miniBest Test: TBD  Single LE Balance Test: TBD  Dual-task Goal: TBD  Functional gait assessment: TBD  PATIENT SURVEYS:  ABC scale: The Activities-Specific Balance Confidence (ABC) Scale 0% 10 20 30  40 50 60 70 80 90 100% No confidence<->completely confident  "How confident are you that you will not lose your balance or become unsteady when you . . .   Date tested 12/15/23   Walk around the house 90%  2. Walk up or down stairs 90%  3. Bend over and pick up a slipper from in front of a closet floor 90%  4. Reach for a small can off a shelf at eye level 100%  5. Stand on tip toes and reach for something above your head 100%  6. Stand on a chair and reach for something 80%  7. Sweep the floor 90%  8. Walk outside the house to a car parked in the driveway 90%  9. Get into or out of a car 80%  10. Walk across a parking lot to the mall 90%  11. Walk up or down a ramp 80%  12. Walk in a crowded mall where people rapidly walk past you 90%  13. Are bumped into by people as you walk through the mall 80%  14. Step onto or off of an escalator while you are holding onto the railing 100%  15. Step onto or off an  escalator while holding onto parcels such that you cannot hold onto the railing 80%  16. Walk outside on icy sidewalks 90%  Total: 16/16  88.75%  TREATMENT DATE: 01/20/24    NMR:  Dynamic high knee march (VC to perform slowly for more control with balance) - down and back- x 5 down and back.   Tandem walk on firm surface (in // bars) - fwd/bwd x 5 with mild unsteadiness and reaching for UE support  Wide airex beam:  Near Tandem gait without UE support down and back -fwd-x 6 Side stepping each direction on beam down and back x 5 (intermittent UE Support due to unsteadiness)    Standing on airex pad with dynamic chest pass to mirror/wall x 20 with feet apart then 20 with feet narrowed then 20 with feet staggered. Only 2 LOB with feet in staggered position- min A for recovery to keep from falling. No report of back pain.     Rockerboard a/p - initially attempting static stand with equal weight bearing- hold 30 sec x 3 then progress to dynamic weight shifting A/P x 20  with intermittent UE Support due to LOB Rockerboard laterally positioned to focus on L/R weight bearing- static stand x 30 sec hold x 2.    PATIENT EDUCATION: Education details: Pt educated on role of PT and services provided during current POC, along with prognosis and information about the clinic. Person educated: Patient and Spouse Education method: Explanation Education comprehension: verbalized understanding  HOME EXERCISE PROGRAM: Access Code: [email or web address] URL: [email or web address] Date: 12/17/2023 Prepared by: Reyes London  Exercises - Sidelying Hip Abduction with Resistance at Ankle  - 3 x weekly - 3 sets - 10 reps - Clamshell with Resistance  - 3 x weekly - 3 sets - 10 reps - Tandem Walking with Counter Support  - 3 x weekly - 3 sets - 10 reps    Access Code:  [email or web address] URL: [email or web address] Date: 12/15/2023 Prepared by: Sidra Simpers  Program Notes **Be sure to perform all exercises next to a countertop or sturdy piece of furniture in case you become unsteady.  Exercises - Standing Heel Raise with Support  - 1 x daily - 7 x weekly - 2 sets - 15 reps - Standing Toe Raises at Chair  - 1 x daily - 7 x weekly - 2 sets - 15 reps - Mini Squat with Counter Support  - 1 x daily - 7 x weekly - 2 sets - 15 reps - Lunge with Counter Support  - 1 x daily - 7 x weekly - 2 sets - 15 reps - Standing Tandem Balance with Counter Support  - 1 x daily - 7 x weekly - 2 sets - 2 reps - 30 hold - Standing Single Leg Stance with Counter Support  - 1 x daily - 7 x weekly - 2 sets - 2 reps - 30 hold  GOALS: Goals reviewed with patient? Yes  SHORT TERM GOALS: Target date: 01/12/2024  Pt will be independent with HEP in order to demonstrate increased ability to perform tasks related to occupation/hobbies. Baseline: Goal status: INITIAL   LONG TERM GOALS: Target date: 03/08/2024  Pt will improve ABC by at least 8% in order to demonstrate improvement in balance confidence. Baseline: 88.75% Goal status: INITIAL   2.  Patient will increase Berg Balance score by > 6 points to demonstrate decreased fall risk during functional activities. Baseline: 12/17/2023= 48/56 Goal status: INITIAL  3.  Patient will increase FGA score by > 2 points to demonstrate decreased fall risk during functional activities. Baseline: 12/17/2023= 27/30 Goal status: Revised  4.  Patient will increase six minute walk test  distance to >1400 for progression to community ambulator and improve gait ability Baseline: TBD Goal status: INITIAL    ASSESSMENT:  CLINICAL IMPRESSION: Patient is a 89 y.o. male who was seen today for physical therapy  treatment for imbalance.   Treatment continued with focus on static and dynamic balance training on various support surfaces with  narrowed BOS.  Patient continues to be challenged with narrowed BOS activities with observed unsteadiness and LOB. He was also well challenged with standing on rocker board with some difficulty with ankle and hip righting reactions- mostly posterior LOB Tolerated all interventions well with greatest difficulty in narrow support of tandem and SLS.  He will benefit from progressive balance activities and will need to continue monitoring for any lingering back issues after reported fall.   He will benefit from further skilled PT services to address his balance impairment and restore functional independence and decrease risk of falling.   OBJECTIVE IMPAIRMENTS: Abnormal gait, decreased activity tolerance, decreased balance, decreased coordination, decreased endurance, decreased mobility, difficulty walking, decreased strength, decreased safety awareness, and impaired sensation.   ACTIVITY LIMITATIONS: carrying, lifting, bending, and stairs  PARTICIPATION LIMITATIONS: cleaning, community activity, and yard work  PERSONAL FACTORS: Age, Past/current experiences, and 3+ comorbidities: TIA, pacemaker, low back pain, thoracic back pain, L sided weakness are also affecting patient's functional outcome.   REHAB POTENTIAL: Good  CLINICAL DECISION MAKING: Stable/uncomplicated  EVALUATION COMPLEXITY: Low  PLAN:  PT FREQUENCY: 1-2x/week  PT DURATION: 12 weeks  PLANNED INTERVENTIONS: 97750- Physical Performance Testing, 97110-Therapeutic exercises, 97530- Therapeutic activity, [id number]- Neuromuscular re-education, 97535- Self Care, [id number]- Manual therapy, [phone]- Gait training, Joint mobilization, Spinal mobilization, and Vestibular training  PLAN FOR NEXT SESSION:  High level dynamic balance activities.  dual task balance activities Progressive LE strengthening.   Note: Portions of this document were prepared using Dragon voice recognition software and although reviewed may contain unintentional dictation  errors in syntax, grammar, or spelling.  Lonni KATHEE Gainer PT ,DPT Physical Therapist- [facility]   8:02 AM 01/23/24

## 2024-01-27 ENCOUNTER — Ambulatory Visit: Admitting: Occupational Therapy

## 2024-01-27 ENCOUNTER — Ambulatory Visit

## 2024-01-27 DIAGNOSIS — M6281 Muscle weakness (generalized): Secondary | ICD-10-CM

## 2024-01-27 DIAGNOSIS — R2689 Other abnormalities of gait and mobility: Secondary | ICD-10-CM

## 2024-01-27 DIAGNOSIS — R278 Other lack of coordination: Secondary | ICD-10-CM | POA: Diagnosis not present

## 2024-01-27 DIAGNOSIS — M545 Low back pain, unspecified: Secondary | ICD-10-CM | POA: Diagnosis not present

## 2024-01-27 DIAGNOSIS — G8929 Other chronic pain: Secondary | ICD-10-CM

## 2024-01-27 DIAGNOSIS — R293 Abnormal posture: Secondary | ICD-10-CM

## 2024-01-27 DIAGNOSIS — M533 Sacrococcygeal disorders, not elsewhere classified: Secondary | ICD-10-CM | POA: Diagnosis not present

## 2024-01-27 NOTE — Therapy (Signed)
 OUTPATIENT OCCUPATIONAL THERAPY NEURO EVALUATION  Patient Name: Thomas [name] MRN: [id number] DOB:[birth year], 89 y.o., male Today's Date: 01/27/2024  PCP: Charlie Forte REFERRING PROVIDER: [name]  END OF SESSION:  OT End of Session - 01/27/24 1055     Visit Number 4    Number of Visits 24    Date for Recertification  03/08/24    OT Start Time 0935    OT Stop Time 1015    OT Time Calculation (min) 40 min    Activity Tolerance Patient tolerated treatment well    Behavior During Therapy [facility] for tasks assessed/performed          Past Medical History:  Diagnosis Date   Aortic insufficiency    a. 07/2023 Echo: Mod AI; b. 09/2023 Echo: Mild-mod AI.   Aortic valve disorders    Arthritis    Basal cell carcinoma    face, nose left shoulder, left arm (06/19/2016)   Basal cell carcinoma 09/13/2020   right temple   BBB (bundle branch block)    hx right   CAD (coronary artery disease)    a. 10/2017 Cath: mod, nonobs dzs; b. 02/2021 Cath: mod, nonobs dzs; c. 11/2022 Cath: LAD 30ost/p, 24m, D1 min irregs, LCX 65p/m, OM1 min irregs, OM2 40, OM3 nl, RCA 30p, 45m, 40d w/ 40 in side branch-->Med rx; d. 11/2023 PCI: LAD 90ost/p (3.5x15 Onyx Frontier DES). Otw stable anatomy.   Chronic back pain    neck, thoracic, lower back (06/19/2016)   CKD (chronic kidney disease), stage III (HCC)    Complete heart block (HCC) 06/2016   a. 06/2016 s/p MDT PPM; b. 01/2023 s/p upgrade to MDT CRT-P (ser # [id number]).   GERD (gastroesophageal reflux disease)    Gout    Heart block    I've had type I, II Wenke before now (06/19/2016)   History of gout    History of hiatal hernia    self dx'd (06/19/2016)   Hyperlipidemia    Hypertension    Lyme disease    dx'd by me 2003; cx's showed dx 08/2015   Migraine    3-4/year (06/19/2016)   Mitral regurgitation    a. 07/2023 Echo: Mod MR.   NICM (nonischemic cardiomyopathy - in setting of RV pacing) (HCC)    a. 02/2019 Echo: EF 45-50%; b. 10/2022  Echo: EF 30-35%; c. 01/2023 CRT-P upgrade; e. 07/2023 Echo: EF 40-45%, glob HK; f. 10/2023 Echo: EF of 55 to 60% with grade 1 DD, nl RV fxn, triv MR, mild to mod AI, and mildly dilated ascending aorta at 42 mm.   PAF (paroxysmal atrial fibrillation) (HCC)    a. CHA2DS2VASc = 7--> eliquis ; b. On tikosyn .   Paroxysmal atrial flutter (HCC)    Presence of permanent cardiac pacemaker    PVC's (premature ventricular contractions)    Renal cancer, left (HCC) 2006   S/P cryotherapy   Spinal stenosis    cervical, 1 thoracic, lumbar (06/19/2016)   Squamous carcinoma    face, nose left shoulder, left arm (06/19/2016)   Stroke (HCC)    TIA (transient ischemic attack) 06/14/2016   I'm not sure that's what it was (06/25/2016)   Visit for monitoring Tikosyn  therapy 09/09/2017   Past Surgical History:  Procedure Laterality Date   ANKLE FRACTURE SURGERY Right 1967   BACK SURGERY  05/07/2020   BASAL CELL CARCINOMA EXCISION     face, nose left shoulder, left arm   BIOPSY PROSTATE  2001 & 2003   BIV  UPGRADE N/A 01/10/2023   Procedure: BIV UPGRADE;  Surgeon: Fernande Elspeth BROCKS, MD;  Location: [facility] INVASIVE CV LAB;  Service: Cardiovascular;  Laterality: N/A;   CARDIAC CATHETERIZATION  1990's   CARDIOVERSION N/A 09/11/2017   Procedure: CARDIOVERSION;  Surgeon: Pietro Redell RAMAN, MD;  Location: [facility] ENDOSCOPY;  Service: Cardiovascular;  Laterality: N/A;   CORONARY STENT INTERVENTION N/A 11/10/2023   Procedure: CORONARY STENT INTERVENTION;  Surgeon: Darron Deatrice LABOR, MD;  Location: [facility] INVASIVE CV LAB;  Service: Cardiovascular;  Laterality: N/A;   FRACTURE SURGERY     HOLEP-LASER ENUCLEATION OF THE PROSTATE WITH MORCELLATION N/A 07/10/2020   Procedure: HOLEP-LASER ENUCLEATION OF THE PROSTATE WITH MORCELLATION;  Surgeon: Penne Knee, MD;  Location: [facility] ORS;  Service: Urology;  Laterality: N/A;   INGUINAL HERNIA REPAIR Left 2012   INSERT / REPLACE / REMOVE PACEMAKER  06/19/2016   LAPAROSCOPIC ABLATION RENAL MASS      LEFT HEART CATH AND CORONARY ANGIOGRAPHY Left 10/23/2017   Procedure: LEFT HEART CATH AND CORONARY ANGIOGRAPHY;  Surgeon: Darron Deatrice LABOR, MD;  Location: [facility] INVASIVE CV LAB;  Service: Cardiovascular;  Laterality: Left;   LEFT HEART CATH AND CORONARY ANGIOGRAPHY Left 02/26/2021   Procedure: LEFT HEART CATH AND CORONARY ANGIOGRAPHY;  Surgeon: Darron Deatrice LABOR, MD;  Location: [facility] INVASIVE CV LAB;  Service: Cardiovascular;  Laterality: Left;   LEFT HEART CATH AND CORONARY ANGIOGRAPHY Left 11/10/2023   Procedure: LEFT HEART CATH AND CORONARY ANGIOGRAPHY;  Surgeon: Darron Deatrice LABOR, MD;  Location: [facility] INVASIVE CV LAB;  Service: Cardiovascular;  Laterality: Left;   PACEMAKER IMPLANT N/A 06/19/2016   Procedure: Pacemaker Implant;  Surgeon: Elspeth BROCKS Fernande, MD;  Location: [facility] INVASIVE CV LAB;  Service: Cardiovascular;  Laterality: N/A;   pacemasker     PROSTATE SURGERY     RIGHT/LEFT HEART CATH AND CORONARY ANGIOGRAPHY Bilateral 11/25/2022   Procedure: RIGHT/LEFT HEART CATH AND CORONARY ANGIOGRAPHY;  Surgeon: Darron Deatrice LABOR, MD;  Location: [facility] INVASIVE CV LAB;  Service: Cardiovascular;  Laterality: Bilateral;   SQUAMOUS CELL CARCINOMA EXCISION     face, nose left shoulder, left arm   TONSILLECTOMY AND ADENOIDECTOMY     Patient Active Problem List   Diagnosis Date Noted   Angina pectoris 11/10/2023   Weakness of left foot 10/29/2023   CKD stage 3b, GFR 30-44 ml/min (HCC) 10/29/2023   Obstructive sleep apnea 10/29/2023   Combined systolic and diastolic congestive heart failure (HCC) 10/29/2023   AKI (acute kidney injury) 04/29/2023   UTI (urinary tract infection) 04/29/2023   Chronic systolic heart failure (HCC) 11/25/2022   Sick sinus syndrome (HCC) 02/26/2022   Closed fracture of second lumbar vertebra (HCC) 09/26/2021   Dysphagia, unspecified 09/26/2021   Heart block 09/26/2021   Heartburn 09/26/2021   History of lumbar fusion 09/26/2021   Inflammation of sacroiliac joint  09/26/2021   Other symptoms and signs involving cognitive functions and awareness 09/26/2021   Prediabetes 09/26/2021   Low back pain 09/26/2021   Renal cell carcinoma (HCC) 09/26/2021   Essential hypertension 09/26/2021   Cerebrovascular disease 09/26/2021   Transient cerebral ischemic attack, unspecified 09/26/2021   Cerebrovascular disease, unspecified 09/26/2021   Transient ischemic attack 09/26/2021   Unstable angina (HCC)    Lumbar burst fracture (HCC) 05/04/2020   Hypogonadism male 10/10/2019   Persistent atrial fibrillation (HCC) 01/12/2019   Acute low back pain 11/04/2017   Abnormal screening cardiac CT    Visit for monitoring Tikosyn  therapy 09/09/2017   History of TIA (transient ischemic attack) 08/29/2016  Paroxysmal atrial fibrillation (HCC) 07/23/2016   CAD (coronary artery disease) 07/23/2016   Snores 06/27/2016   S/P placement of cardiac pacemaker 06/25/2016   Hemiparesis (HCC) 06/25/2016   Acute ischemic stroke (HCC)    Left-sided weakness    Hemisensory loss    Dysarthria    Stroke-like symptoms    Complete heart block (HCC)    Pain in thoracic spine    Orthostasis    Lethargy    Vertebral artery occlusion, left    TIA (transient ischemic attack) 06/14/2016   Arthritis 02/06/2015   Esophageal reflux 02/06/2015   Arthritis urica 02/06/2015   Cannot sleep 02/06/2015   Arthritis, degenerative 02/06/2015   Adenocarcinoma, renal cell (HCC) 02/06/2015   Benign prostatic hyperplasia with lower urinary tract symptoms 11/21/2014   Personal history of other malignant neoplasm of kidney 11/21/2014   History of Lyme disease 05/06/2014   Palpitations 12/31/2013   Hyperlipidemia 11/02/2010   HYPERTENSION, BENIGN 04/16/2010    ONSET DATE: 11/10/23  REFERRING DIAG: CVA  THERAPY DIAG:  Muscle weakness (generalized)  Rationale for Evaluation and Treatment: Rehabilitation  SUBJECTIVE:   SUBJECTIVE STATEMENT: Pt. reports just waking up this morning Pt  accompanied by: self and significant other  PERTINENT HISTORY: 89 year old male who was admitted to hospital on 11/10/23 for management of unstable angina after pt developed clumsiness of the left arm after cardiac catheterization 7/7, workup for TIA. Per neurology note symptoms secondary to relative cerebral hypoperfusion in the setting of heart disease and right carotid artery and M1 stenosis. PMhx: PAF on Eliquis , HTN, HLD, prediabetes, OSA on CPAP, dominant left vertebral artery occlusion, TIA, SSS s/p PPM, BPH, renal CA, lumbar fusion, A-fib.  PRECAUTIONS: None  WEIGHT BEARING RESTRICTIONS: No  PAIN:  Are you having pain? 4/10  back pain  FALLS: Has patient fallen in last 6 months? Yes. Number of falls 4  LIVING ENVIRONMENT: Lives with: lives with their spouse Lives in: House/apartment Stairs: Yes: Internal: 2 steps; none Has following equipment at home: Single point cane and Walker - 2 wheeled  PLOF: Independent with basic ADLs  PATIENT GOALS: to go to [facility] for more detailed neuro testing and return to function in LUE  OBJECTIVE:  Note: Objective measures were completed at Evaluation unless otherwise noted.  HAND DOMINANCE: Right  ADLs: Overall ADLs: MIN A Transfers/ambulation related to ADLs: Eating: IND - uses dominant R Grooming: SETUP UB Dressing: SETUP - buttons assist LB Dressing: MIN - hiking pants  Toileting: IND Bathing: IND Tub Shower transfers: IND Equipment: none  IADLs: Shopping: spouse completes as needed Light housekeeping: spouse completes as needed Meal Prep: assist from spouse Community mobility: SPC/RW as needed Medication management: IND Landscape architect: IND Handwriting: 100% legible R hand  MOBILITY STATUS: Independent and Hx of falls  POSTURE COMMENTS:  rounded shoulders and forward head Sitting balance: Sits without UE support up to 30 sec  ACTIVITY TOLERANCE: Activity tolerance: IND - tolerates 20 min standing activities    FUNCTIONAL OUTCOME MEASURES:    UPPER EXTREMITY ROM:   BUE AROM WFL   UPPER EXTREMITY MMT:     MMT Right eval Left eval  Shoulder flexion    Shoulder abduction    Shoulder adduction    Shoulder extension    Shoulder internal rotation    Shoulder external rotation    Middle trapezius    Lower trapezius    Elbow flexion    Elbow extension    Wrist flexion    Wrist extension  Wrist ulnar deviation    Wrist radial deviation    Wrist pronation    Wrist supination    (Blank rows = not tested)  HAND FUNCTION: Grip strength: Right: 76 lbs; Left: 55 lbs, Lateral pinch: Right: 15 lbs, Left: 11 lbs, and 3 point pinch: Right: 21 lbs, Left: 14 lbs  COORDINATION: 9 Hole Peg test: Right: 26 sec; Left: 10 sec  SENSATION:  12/17/23 Light touch: decreased sensation and numbness in L hand 5th digit Stereognosis:   Hot/Cold:   Proprioception: intact  EDEMA: mild swelling to L hand   COGNITION: Overall cognitive status: Within functional limits for tasks assessed  VISION: Subjective report: baseline Baseline vision: No visual deficits  VISION ASSESSMENT: WFL                                                                                                                            TREATMENT DATE: 01/27/24   Therapeutic Exercise:   -UE strengthening through reciprocal motion using the Sci[employer] for 8 min. On level 4.0 with constant monitoring of the BUEs. Pt. Worked on changing, and alternating forward reverse position every 2 min.  Therapeutic Activity:  Progressive left gross grip strengthening with  for 2 trials 23.4# 1/2 way through 1st trial then modified to 17.9# of grip strength resistive force for the remainder of the first set, and the 2nd set. -Left lateral, and 3pt. Pinch strengthening using yellow, red, and green level resistive clips  -Facilitated  translatory movements moving the  clips from the lateral pinch position to the 3pt. Pinch position in  preparation for securely placing them on the dowel to promote hand function skills. -Incorporated a reaching component through multiple planes with 1.5# weight in place in combination with the task to further challenge distal motor control while moving the proximal UE.   Neuromuscular re-education:   -Facilitated left hand [facility]  skills grasping 1 sticks, and  cylindrical collars on the [employer] pegboard. Pt. Worked on Grasping each item with the  2nd digit and thumb from a horizontal position, and placing them vertically into the pegboard.   PATIENT EDUCATION: Education details: Left hand [facility] skills. Person educated: Patient and Spouse Education method: Explanation, Demonstration, and Handouts Education comprehension: verbalized understanding  HOME EXERCISE PROGRAM: Blue theraputty [facility] - coins at tabletop    GOALS: Goals reviewed with patient? Yes  SHORT TERM GOALS: Target date: 01/26/24  Pt will IND complete HEP for functional strenghening of non-dominant LUE. Baseline: not initiated Goal status: INITIAL   LONG TERM GOALS: Target date: 03/08/24  Pt will increase L pinch strength by 5 # of force to securely hold a wrench during leisure activities. Baseline: L pinch 14# Goal status: INITIAL  2.  Pt will increase L grip strength by 15# to wash dishes without dropping items.  Baseline: L grip 55# Goal status: INITIAL  3.  Pt will improve LUE coordination by 30 seconds to independently manipulate fasteners during dressing tasks.  Baseline: L 1'10.  Goal status: INITIAL  4.  Pt will improve dynamic seated balance to don B shoes independently on edge of bed without loss of balance. Baseline: requires chair with armrest to don shoes with standby assist Goal status: INITIAL   ASSESSMENT:  CLINICAL IMPRESSION:  Pt. required grip strength resistance to be modified from 23.4# to 17.9# after 1/2 of the 1st trial. Pt. was able to complete the remainder of the 1st, and the 2nd  trial with17.9# of grip strength force. Pt. Presented with difficulty at times performing translatory movements moving the clips from her palm to the tip of the 2nd digit and thumb in preparation fro palcing them onto a horizontal dowel. Pt. continues to present with weakness, and decreased motor control in the left UE, and hand. Pt. continues to benefit from OT services to work on L grip and lateral pinch strength, L [facility] skills, and dynamic sitting balance to improve functional independence of ADL/IADL tasks at home.   PERFORMANCE DEFICITS: in functional skills including ADLs, IADLs, coordination, dexterity, sensation, and edema and psychosocial skills including environmental adaptation.   IMPAIRMENTS: are limiting patient from ADLs, IADLs, and leisure.   CO-MORBIDITIES: may have co-morbidities  that affects occupational performance. Patient will benefit from skilled OT to address above impairments and improve overall function.  MODIFICATION OR ASSISTANCE TO COMPLETE EVALUATION: No modification of tasks or assist necessary to complete an evaluation.  OT OCCUPATIONAL PROFILE AND HISTORY: Problem focused assessment: Including review of records relating to presenting problem.  CLINICAL DECISION MAKING: LOW - limited treatment options, no task modification necessary  REHAB POTENTIAL: Good  EVALUATION COMPLEXITY: Moderate    PLAN:  OT FREQUENCY: 1-2x/week  OT DURATION: 12 weeks  PLANNED INTERVENTIONS: 97168 OT Re-evaluation, 97535 self care/ADL training, 02889 therapeutic exercise, 97530 therapeutic activity, and 97112 neuromuscular re-education  RECOMMENDED OTHER SERVICES: PT  CONSULTED AND AGREED WITH PLAN OF CARE: Patient and family member/caregiver  PLAN FOR NEXT SESSION: sensation evaluation  Richardson Otter, MS, OTR/L   01/27/2024, 10:57 AM

## 2024-01-27 NOTE — Therapy (Signed)
 OUTPATIENT PHYSICAL THERAPY NEURO TREATMENT   Patient Name: Thomas [name] MRN: [id number] DOB:Aug 05, 1932, 89 y.o., male Today's Date: 12/15/2023   PCP: [name], MD  REFERRING PROVIDER: Darron Deatrice LABOR, MD    PT End of Session - 01/27/24 1037     Visit Number 6    Number of Visits 25    Date for Recertification  03/08/24    PT Start Time 1017    PT Stop Time 1100    PT Time Calculation (min) 43 min    Equipment Utilized During Treatment Gait belt    Activity Tolerance Patient tolerated treatment well    Behavior During Therapy [facility] for tasks assessed/performed             Past Medical History:  Diagnosis Date   Aortic insufficiency    a. 07/2023 Echo: Mod AI; b. 09/2023 Echo: Mild-mod AI.   Aortic valve disorders    Arthritis    Basal cell carcinoma    face, nose left shoulder, left arm (06/19/2016)   Basal cell carcinoma 09/13/2020   right temple   BBB (bundle branch block)    hx right   CAD (coronary artery disease)    a. 10/2017 Cath: mod, nonobs dzs; b. 02/2021 Cath: mod, nonobs dzs; c. 11/2022 Cath: LAD 30ost/p, 72m, D1 min irregs, LCX 65p/m, OM1 min irregs, OM2 40, OM3 nl, RCA 30p, 15m, 40d w/ 40 in side branch-->Med rx.   Chronic back pain    neck, thoracic, lower back (06/19/2016)   CKD (chronic kidney disease), stage III (HCC)    Complete heart block (HCC) 06/2016   a. 06/2016 s/p MDT PPM; b. 01/2023 s/p upgrade to MDT CRT-P (ser # [id number]).   GERD (gastroesophageal reflux disease)    Gout    Heart block    I've had type I, II Wenke before now (06/19/2016)   History of gout    History of hiatal hernia    self dx'd (06/19/2016)   Hyperlipidemia    Hypertension    Lyme disease    dx'd by me 2003; cx's showed dx 08/2015   Migraine    3-4/year (06/19/2016)   Mitral regurgitation    a. 07/2023 Echo: Mod MR.   NICM (nonischemic cardiomyopathy - in setting of RV pacing) (HCC)    a. 02/2019 Echo: EF 45-50%; b. 10/2022 Echo: EF 30-35%; c.  01/2023 CRT-P upgrade; e. 07/2023 Echo: EF 40-45%, glob HK; f. 10/2023 Echo: EF of 55 to 60% with grade 1 DD, nl RV fxn, triv MR, mild to mod AI, and mildly dilated ascending aorta at 42 mm.   PAF (paroxysmal atrial fibrillation) (HCC)    a. CHA2DS2VASc = 7--> eliquis ; b. On tikosyn .   Paroxysmal atrial flutter (HCC)    Presence of permanent cardiac pacemaker    PVC's (premature ventricular contractions)    Renal cancer, left (HCC) 2006   S/P cryotherapy   Spinal stenosis    cervical, 1 thoracic, lumbar (06/19/2016)   Squamous carcinoma    face, nose left shoulder, left arm (06/19/2016)   Stroke (HCC)    TIA (transient ischemic attack) 06/14/2016   I'm not sure that's what it was (06/25/2016)   Visit for monitoring Tikosyn  therapy 09/09/2017   Past Surgical History:  Procedure Laterality Date   ANKLE FRACTURE SURGERY Right 1967   BACK SURGERY  05/07/2020   BASAL CELL CARCINOMA EXCISION     face, nose left shoulder, left arm   BIOPSY PROSTATE  2001 &  2003   BIV UPGRADE N/A 01/10/2023   Procedure: BIV UPGRADE;  Surgeon: Fernande Elspeth BROCKS, MD;  Location[facility] INVASIVE CV LAB;  Service: Cardiovascular;  Laterality: N/A;   CARDIAC CATHETERIZATION  1990's   CARDIOVERSION N/A 09/11/2017   Procedure: CARDIOVERSION;  Surgeon: Pietro Redell RAMAN, MD;  Location: [facility] ENDOSCOPY;  Service: Cardiovascular;  Laterality: N/A;   CORONARY STENT INTERVENTION N/A 11/10/2023   Procedure: CORONARY STENT INTERVENTION;  Surgeon: Darron Deatrice LABOR, MD;  Location: [facility] INVASIVE CV LAB;  Service: Cardiovascular;  Laterality: N/A;   FRACTURE SURGERY     HOLEP-LASER ENUCLEATION OF THE PROSTATE WITH MORCELLATION N/A 07/10/2020   Procedure: HOLEP-LASER ENUCLEATION OF THE PROSTATE WITH MORCELLATION;  Surgeon: Penne Knee, MD;  Location: [facility] ORS;  Service: Urology;  Laterality: N/A;   INGUINAL HERNIA REPAIR Left 2012   INSERT / REPLACE / REMOVE PACEMAKER  06/19/2016   LAPAROSCOPIC ABLATION RENAL MASS     LEFT HEART CATH  AND CORONARY ANGIOGRAPHY Left 10/23/2017   Procedure: LEFT HEART CATH AND CORONARY ANGIOGRAPHY;  Surgeon: Darron Deatrice LABOR, MD;  Location: [facility] INVASIVE CV LAB;  Service: Cardiovascular;  Laterality: Left;   LEFT HEART CATH AND CORONARY ANGIOGRAPHY Left 02/26/2021   Procedure: LEFT HEART CATH AND CORONARY ANGIOGRAPHY;  Surgeon: Darron Deatrice LABOR, MD;  Location: [facility] INVASIVE CV LAB;  Service: Cardiovascular;  Laterality: Left;   LEFT HEART CATH AND CORONARY ANGIOGRAPHY Left 11/10/2023   Procedure: LEFT HEART CATH AND CORONARY ANGIOGRAPHY;  Surgeon: Darron Deatrice LABOR, MD;  Location: [facility] INVASIVE CV LAB;  Service: Cardiovascular;  Laterality: Left;   PACEMAKER IMPLANT N/A 06/19/2016   Procedure: Pacemaker Implant;  Surgeon: Elspeth BROCKS Fernande, MD;  Location: [facility] INVASIVE CV LAB;  Service: Cardiovascular;  Laterality: N/A;   pacemasker     PROSTATE SURGERY     RIGHT/LEFT HEART CATH AND CORONARY ANGIOGRAPHY Bilateral 11/25/2022   Procedure: RIGHT/LEFT HEART CATH AND CORONARY ANGIOGRAPHY;  Surgeon: Darron Deatrice LABOR, MD;  Location: [facility] INVASIVE CV LAB;  Service: Cardiovascular;  Laterality: Bilateral;   SQUAMOUS CELL CARCINOMA EXCISION     face, nose left shoulder, left arm   TONSILLECTOMY AND ADENOIDECTOMY     Patient Active Problem List   Diagnosis Date Noted   Angina pectoris (HCC) 11/10/2023   Weakness of left foot 10/29/2023   CKD stage 3b, GFR 30-44 ml/min (HCC) 10/29/2023   Obstructive sleep apnea 10/29/2023   Combined systolic and diastolic congestive heart failure (HCC) 10/29/2023   AKI (acute kidney injury) (HCC) 04/29/2023   UTI (urinary tract infection) 04/29/2023   Chronic systolic heart failure (HCC) 11/25/2022   Sick sinus syndrome (HCC) 02/26/2022   Closed fracture of second lumbar vertebra (HCC) 09/26/2021   Dysphagia, unspecified 09/26/2021   Heart block 09/26/2021   Heartburn 09/26/2021   History of lumbar fusion 09/26/2021   Inflammation of sacroiliac joint (HCC) 09/26/2021    Other symptoms and signs involving cognitive functions and awareness 09/26/2021   Prediabetes 09/26/2021   Low back pain 09/26/2021   Renal cell carcinoma (HCC) 09/26/2021   Essential hypertension 09/26/2021   Cerebrovascular disease 09/26/2021   Transient cerebral ischemic attack, unspecified 09/26/2021   Cerebrovascular disease, unspecified 09/26/2021   Transient ischemic attack 09/26/2021   Unstable angina (HCC)    Lumbar burst fracture (HCC) 05/04/2020   Hypogonadism male 10/10/2019   Persistent atrial fibrillation (HCC) 01/12/2019   Acute low back pain 11/04/2017   Abnormal screening cardiac CT    Visit for monitoring Tikosyn  therapy 09/09/2017  History of TIA (transient ischemic attack) 08/29/2016   Paroxysmal atrial fibrillation (HCC) 07/23/2016   CAD (coronary artery disease) 07/23/2016   Snores 06/27/2016   S/P placement of cardiac pacemaker 06/25/2016   Hemiparesis (HCC) 06/25/2016   Acute ischemic stroke (HCC)    Left-sided weakness    Hemisensory loss    Dysarthria    Stroke-like symptoms    Complete heart block (HCC)    Pain in thoracic spine    Orthostasis    Lethargy    Vertebral artery occlusion, left    TIA (transient ischemic attack) 06/14/2016   Arthritis 02/06/2015   Esophageal reflux 02/06/2015   Arthritis urica 02/06/2015   Cannot sleep 02/06/2015   Arthritis, degenerative 02/06/2015   Adenocarcinoma, renal cell (HCC) 02/06/2015   Benign prostatic hyperplasia with lower urinary tract symptoms 11/21/2014   Personal history of other malignant neoplasm of kidney 11/21/2014   History of Lyme disease 05/06/2014   Palpitations 12/31/2013   Hyperlipidemia 11/02/2010   HYPERTENSION, BENIGN 04/16/2010    ONSET DATE: 05/06/2016   REFERRING DIAG:  R20.0 (ICD-10-CM) - Anesthesia of skin  R29.90 (ICD-10-CM) - Unspecified symptoms and signs involving the nervous system  G45.9 (ICD-10-CM) - Transient cerebral ischemic attack, unspecified    THERAPY  DIAG:  No diagnosis found.  Rationale for Evaluation and Treatment: Rehabilitation  SUBJECTIVE:                                                                                                                                                                                             SUBJECTIVE STATEMENT: TODAY: Patient reports back pain is slowly improving. States it was bothering me yesterday but better today.  From EVAL: Pt accompanied by spouse who was a Interior and spatial designer of women's care at the hospital.  Pt reported he had a stroke in 2018 and a stent placed in June of 2025.  Pt notes that after the stint being placed, he felt numbness on the L side of the body.  Pt reports some weakness in the L LE, but more of the symptoms were found to be in the UE.    Pt had a prostate surgery back in 2022, and was seeing Shin-Yiing for issues in the sacroilliac region.    Pt accompanied by: significant other  PERTINENT HISTORY: Pt has prior history of pacemaker, heart block, stints, and stroke.    PAIN:  Are you having pain? No  PRECAUTIONS: Fall and ICD/Pacemaker  RED FLAGS: Cauda equina syndrome: Yes: pt reporting some incontinence issues and numbness and tingling in the genital region.     WEIGHT BEARING RESTRICTIONS: No  FALLS: Has  patient fallen in last 6 months? Yes. Number of falls 2 falls in April.  Pt fell and broke ribs back in April, but has had more near miss falls lately due to the L LE giving out.    LIVING ENVIRONMENT: Lives with: lives with their spouse Lives in: House/apartment Stairs: Yes: External: 2 steps; none Has following equipment at home: None  PLOF: Independent  PATIENT GOALS: To have a normal walking gait, and reduce pain in the low back.  Increase flexibility.  OBJECTIVE:  Note: Objective measures were completed at Evaluation unless otherwise noted.  DIAGNOSTIC FINDINGS:    EXAM: CT HEAD WITHOUT CONTRAST  IMPRESSION: 1.  No evidence of an acute  intracranial abnormality. 2. Parenchymal atrophy and chronic small vessel ischemic disease.   COGNITION: Overall cognitive status: Within functional limits for tasks assessed   SENSATION: WFL  COORDINATION: WFL   POSTURE: rounded shoulders, forward head, decreased lumbar lordosis, and increased thoracic kyphosis  LOWER EXTREMITY ROM:     Active  Right Eval Left Eval  Hip flexion    Hip extension    Hip abduction    Hip adduction    Hip internal rotation    Hip external rotation    Knee flexion    Knee extension    Ankle dorsiflexion    Ankle plantarflexion    Ankle inversion    Ankle eversion     (Blank rows = not tested)  LOWER EXTREMITY MMT:    MMT Right Eval Left Eval  Hip flexion 5 4  Hip extension    Hip abduction 5 4-  Hip adduction 5 4-  Hip internal rotation    Hip external rotation    Knee flexion 4+ 4-  Knee extension 4 4-  Ankle dorsiflexion 5 5  Ankle plantarflexion    Ankle inversion    Ankle eversion    (Blank rows = not tested)  BED MOBILITY:  Not tested  STAIRS: Not tested  FUNCTIONAL TESTS:  5 times sit to stand: 8.80 sec  Timed up and go (TUG): 8.61  miniBest Test: TBD  Single LE Balance Test: TBD  Dual-task Goal: TBD  Functional gait assessment: TBD  PATIENT SURVEYS:  ABC scale: The Activities-Specific Balance Confidence (ABC) Scale 0% 10 20 30  40 50 60 70 80 90 100% No confidence<->completely confident  "How confident are you that you will not lose your balance or become unsteady when you . . .   Date tested 12/15/23   Walk around the house 90%  2. Walk up or down stairs 90%  3. Bend over and pick up a slipper from in front of a closet floor 90%  4. Reach for a small can off a shelf at eye level 100%  5. Stand on tip toes and reach for something above your head 100%  6. Stand on a chair and reach for something 80%  7. Sweep the floor 90%  8. Walk outside the house to a car parked in the driveway 90%  9. Get  into or out of a car 80%  10. Walk across a parking lot to the mall 90%  11. Walk up or down a ramp 80%  12. Walk in a crowded mall where people rapidly walk past you 90%  13. Are bumped into by people as you walk through the mall 80%  14. Step onto or off of an escalator while you are holding onto the railing 100%  15. Step onto or off an escalator  while holding onto parcels such that you cannot hold onto the railing 80%  16. Walk outside on icy sidewalks 90%  Total: 16/16  88.75%                                                                                                                                 TREATMENT DATE: 01/20/24    NMR:   Side stepping on airex beam at support (holding onto 7# db for dual task)- down and back x 5.   Dynamic high knee march standing on airex pad  (VC to perform slowly for more control with balance) x 25 alt LE.   Tandem standing on airex pad - multiple attempts holding up to 30 sec each side. (At best able to hold 30 sec each LE- at worst- 9 sec)    Forward lean with LUE reaching- while dynamically swinging RLE back into hip ext x 10 reps then switch for 10 more reps (more unstable with Standing RLE).     Rockerboard a/p - initially attempting static stand with equal weight bearing- hold 30 sec x 3 then progress to dynamic weight shifting A/P x 20  with no  UE Support   Therapeutic Activities: dynamic therapeutic activities designed to achieve improved functional performance:  Ambulation across stable and unstable surface outside. Negotiating changing surfaces from grass, concrete, aphalt, pine needles, gravel including hilly terrain, Ramp, curbs, sidewalks, around trees and buildings-  turns and obstacles in pathway without LOB x 21 min   PATIENT EDUCATION: Education details: Pt educated on role of PT and services provided during current POC, along with prognosis and information about the clinic. Person educated: Patient and Spouse Education  method: Explanation Education comprehension: verbalized understanding  HOME EXERCISE PROGRAM: Access Code: [email or web address] URL: [email or web address] Date: 12/17/2023 Prepared by: Reyes London  Exercises - Sidelying Hip Abduction with Resistance at Ankle  - 3 x weekly - 3 sets - 10 reps - Clamshell with Resistance  - 3 x weekly - 3 sets - 10 reps - Tandem Walking with Counter Support  - 3 x weekly - 3 sets - 10 reps    Access Code: [email or web address] URL: [email or web address] Date: 12/15/2023 Prepared by: Sidra Simpers  Program Notes **Be sure to perform all exercises next to a countertop or sturdy piece of furniture in case you become unsteady.  Exercises - Standing Heel Raise with Support  - 1 x daily - 7 x weekly - 2 sets - 15 reps - Standing Toe Raises at Chair  - 1 x daily - 7 x weekly - 2 sets - 15 reps - Mini Squat with Counter Support  - 1 x daily - 7 x weekly - 2 sets - 15 reps - Lunge with Counter Support  - 1 x daily - 7 x weekly - 2 sets - 15 reps - Standing Tandem Balance with Counter Support  - 1 x daily - 7 x weekly - 2  sets - 2 reps - 30 hold - Standing Single Leg Stance with Counter Support  - 1 x daily - 7 x weekly - 2 sets - 2 reps - 30 hold  GOALS: Goals reviewed with patient? Yes  SHORT TERM GOALS: Target date: 01/12/2024  Pt will be independent with HEP in order to demonstrate increased ability to perform tasks related to occupation/hobbies. Baseline: Goal status: INITIAL   LONG TERM GOALS: Target date: 03/08/2024  Pt will improve ABC by at least 8% in order to demonstrate improvement in balance confidence. Baseline: 88.75% Goal status: INITIAL   2.  Patient will increase Berg Balance score by > 6 points to demonstrate decreased fall risk during functional activities. Baseline: 12/17/2023= 48/56 Goal status: INITIAL  3.  Patient will increase FGA score by > 2 points to demonstrate decreased fall risk during functional  activities. Baseline: 12/17/2023= 27/30 Goal status: Revised  4.  Patient will increase six minute walk test distance to >1400 for progression to community ambulator and improve gait ability Baseline: TBD Goal status: INITIAL    ASSESSMENT:  CLINICAL IMPRESSION: Patient is a 89 y.o. male who was seen today for physical therapy  treatment for imbalance. Patient denied any LBP today with activities and responded well to outdoor walking- able to walk on unstable surfaces without LOB or complaint of pain. He continues to improve with dynamic balance- less unsteadiness with tandem and rockerboard activities today.   He will benefit from further skilled PT services to address his balance impairment and restore functional independence and decrease risk of falling.   OBJECTIVE IMPAIRMENTS: Abnormal gait, decreased activity tolerance, decreased balance, decreased coordination, decreased endurance, decreased mobility, difficulty walking, decreased strength, decreased safety awareness, and impaired sensation.   ACTIVITY LIMITATIONS: carrying, lifting, bending, and stairs  PARTICIPATION LIMITATIONS: cleaning, community activity, and yard work  PERSONAL FACTORS: Age, Past/current experiences, and 3+ comorbidities: TIA, pacemaker, low back pain, thoracic back pain, L sided weakness are also affecting patient's functional outcome.   REHAB POTENTIAL: Good  CLINICAL DECISION MAKING: Stable/uncomplicated  EVALUATION COMPLEXITY: Low  PLAN:  PT FREQUENCY: 1-2x/week  PT DURATION: 12 weeks  PLANNED INTERVENTIONS: 97750- Physical Performance Testing, 97110-Therapeutic exercises, 97530- Therapeutic activity, [id number]- Neuromuscular re-education, 97535- Self Care, [id number]- Manual therapy, [phone]- Gait training, Joint mobilization, Spinal mobilization, and Vestibular training  PLAN FOR NEXT SESSION:  High level dynamic balance activities.  dual task balance activities Progressive LE strengthening.      Reyes LOISE London PT  Physical Therapist- [facility]   11:35 AM 01/27/24

## 2024-01-29 ENCOUNTER — Ambulatory Visit: Admitting: Physical Therapy

## 2024-01-29 DIAGNOSIS — R278 Other lack of coordination: Secondary | ICD-10-CM | POA: Diagnosis not present

## 2024-01-29 DIAGNOSIS — M533 Sacrococcygeal disorders, not elsewhere classified: Secondary | ICD-10-CM | POA: Diagnosis not present

## 2024-01-29 DIAGNOSIS — G8929 Other chronic pain: Secondary | ICD-10-CM | POA: Diagnosis not present

## 2024-01-29 DIAGNOSIS — R293 Abnormal posture: Secondary | ICD-10-CM

## 2024-01-29 DIAGNOSIS — M545 Low back pain, unspecified: Secondary | ICD-10-CM | POA: Diagnosis not present

## 2024-01-29 DIAGNOSIS — R2689 Other abnormalities of gait and mobility: Secondary | ICD-10-CM | POA: Diagnosis not present

## 2024-01-29 DIAGNOSIS — M6281 Muscle weakness (generalized): Secondary | ICD-10-CM | POA: Diagnosis not present

## 2024-01-29 NOTE — Therapy (Signed)
 OUTPATIENT PHYSICAL THERAPY NEURO TREATMENT   Patient Name: Thomas [name] MRN: [id number] DOB:[birth year], 89 y.o., male Today's Date: 12/15/2023   PCP: [name], MD  REFERRING PROVIDER: Darron Deatrice LABOR, MD    PT End of Session - 01/29/24 1330     Visit Number 7    Number of Visits 25    Date for Recertification  03/08/24    Progress Note Due on Visit 10    PT Start Time 1327    PT Stop Time 1359    PT Time Calculation (min) 32 min    Equipment Utilized During Treatment Gait belt    Activity Tolerance Patient tolerated treatment well    Behavior During Therapy [facility] for tasks assessed/performed              Past Medical History:  Diagnosis Date   Aortic insufficiency    a. 07/2023 Echo: Mod AI; b. 09/2023 Echo: Mild-mod AI.   Aortic valve disorders    Arthritis    Basal cell carcinoma    face, nose left shoulder, left arm (06/19/2016)   Basal cell carcinoma 09/13/2020   right temple   BBB (bundle branch block)    hx right   CAD (coronary artery disease)    a. 10/2017 Cath: mod, nonobs dzs; b. 02/2021 Cath: mod, nonobs dzs; c. 11/2022 Cath: LAD 30ost/p, 28m, D1 min irregs, LCX 65p/m, OM1 min irregs, OM2 40, OM3 nl, RCA 30p, 68m, 40d w/ 40 in side branch-->Med rx.   Chronic back pain    neck, thoracic, lower back (06/19/2016)   CKD (chronic kidney disease), stage III (HCC)    Complete heart block (HCC) 06/2016   a. 06/2016 s/p MDT PPM; b. 01/2023 s/p upgrade to MDT CRT-P (ser # [id number]).   GERD (gastroesophageal reflux disease)    Gout    Heart block    I've had type I, II Wenke before now (06/19/2016)   History of gout    History of hiatal hernia    self dx'd (06/19/2016)   Hyperlipidemia    Hypertension    Lyme disease    dx'd by me 2003; cx's showed dx 08/2015   Migraine    3-4/year (06/19/2016)   Mitral regurgitation    a. 07/2023 Echo: Mod MR.   NICM (nonischemic cardiomyopathy - in setting of RV pacing) (HCC)    a. 02/2019 Echo: EF  45-50%; b. 10/2022 Echo: EF 30-35%; c. 01/2023 CRT-P upgrade; e. 07/2023 Echo: EF 40-45%, glob HK; f. 10/2023 Echo: EF of 55 to 60% with grade 1 DD, nl RV fxn, triv MR, mild to mod AI, and mildly dilated ascending aorta at 42 mm.   PAF (paroxysmal atrial fibrillation) (HCC)    a. CHA2DS2VASc = 7--> eliquis ; b. On tikosyn .   Paroxysmal atrial flutter (HCC)    Presence of permanent cardiac pacemaker    PVC's (premature ventricular contractions)    Renal cancer, left (HCC) 2006   S/P cryotherapy   Spinal stenosis    cervical, 1 thoracic, lumbar (06/19/2016)   Squamous carcinoma    face, nose left shoulder, left arm (06/19/2016)   Stroke (HCC)    TIA (transient ischemic attack) 06/14/2016   I'm not sure that's what it was (06/25/2016)   Visit for monitoring Tikosyn  therapy 09/09/2017   Past Surgical History:  Procedure Laterality Date   ANKLE FRACTURE SURGERY Right 1967   BACK SURGERY  05/07/2020   BASAL CELL CARCINOMA EXCISION     face, nose  left shoulder, left arm   BIOPSY PROSTATE  2001 & 2003   BIV UPGRADE N/A 01/10/2023   Procedure: BIV UPGRADE;  Surgeon: Fernande Elspeth BROCKS, MD;  Location: [facility] INVASIVE CV LAB;  Service: Cardiovascular;  Laterality: N/A;   CARDIAC CATHETERIZATION  1990's   CARDIOVERSION N/A 09/11/2017   Procedure: CARDIOVERSION;  Surgeon: Pietro Redell RAMAN, MD;  Location: [facility] ENDOSCOPY;  Service: Cardiovascular;  Laterality: N/A;   CORONARY STENT INTERVENTION N/A 11/10/2023   Procedure: CORONARY STENT INTERVENTION;  Surgeon: Darron Deatrice LABOR, MD;  Location: [facility] INVASIVE CV LAB;  Service: Cardiovascular;  Laterality: N/A;   FRACTURE SURGERY     HOLEP-LASER ENUCLEATION OF THE PROSTATE WITH MORCELLATION N/A 07/10/2020   Procedure: HOLEP-LASER ENUCLEATION OF THE PROSTATE WITH MORCELLATION;  Surgeon: Penne Knee, MD;  Location: [facility] ORS;  Service: Urology;  Laterality: N/A;   INGUINAL HERNIA REPAIR Left 2012   INSERT / REPLACE / REMOVE PACEMAKER  06/19/2016   LAPAROSCOPIC  ABLATION RENAL MASS     LEFT HEART CATH AND CORONARY ANGIOGRAPHY Left 10/23/2017   Procedure: LEFT HEART CATH AND CORONARY ANGIOGRAPHY;  Surgeon: Darron Deatrice LABOR, MD;  Location: [facility] INVASIVE CV LAB;  Service: Cardiovascular;  Laterality: Left;   LEFT HEART CATH AND CORONARY ANGIOGRAPHY Left 02/26/2021   Procedure: LEFT HEART CATH AND CORONARY ANGIOGRAPHY;  Surgeon: Darron Deatrice LABOR, MD;  Location: [facility] INVASIVE CV LAB;  Service: Cardiovascular;  Laterality: Left;   LEFT HEART CATH AND CORONARY ANGIOGRAPHY Left 11/10/2023   Procedure: LEFT HEART CATH AND CORONARY ANGIOGRAPHY;  Surgeon: Darron Deatrice LABOR, MD;  Location: [facility] INVASIVE CV LAB;  Service: Cardiovascular;  Laterality: Left;   PACEMAKER IMPLANT N/A 06/19/2016   Procedure: Pacemaker Implant;  Surgeon: Elspeth BROCKS Fernande, MD;  Location: [facility] INVASIVE CV LAB;  Service: Cardiovascular;  Laterality: N/A;   pacemasker     PROSTATE SURGERY     RIGHT/LEFT HEART CATH AND CORONARY ANGIOGRAPHY Bilateral 11/25/2022   Procedure: RIGHT/LEFT HEART CATH AND CORONARY ANGIOGRAPHY;  Surgeon: Darron Deatrice LABOR, MD;  Location: [facility] INVASIVE CV LAB;  Service: Cardiovascular;  Laterality: Bilateral;   SQUAMOUS CELL CARCINOMA EXCISION     face, nose left shoulder, left arm   TONSILLECTOMY AND ADENOIDECTOMY     Patient Active Problem List   Diagnosis Date Noted   Angina pectoris (HCC) 11/10/2023   Weakness of left foot 10/29/2023   CKD stage 3b, GFR 30-44 ml/min (HCC) 10/29/2023   Obstructive sleep apnea 10/29/2023   Combined systolic and diastolic congestive heart failure (HCC) 10/29/2023   AKI (acute kidney injury) (HCC) 04/29/2023   UTI (urinary tract infection) 04/29/2023   Chronic systolic heart failure (HCC) 11/25/2022   Sick sinus syndrome (HCC) 02/26/2022   Closed fracture of second lumbar vertebra (HCC) 09/26/2021   Dysphagia, unspecified 09/26/2021   Heart block 09/26/2021   Heartburn 09/26/2021   History of lumbar fusion 09/26/2021    Inflammation of sacroiliac joint (HCC) 09/26/2021   Other symptoms and signs involving cognitive functions and awareness 09/26/2021   Prediabetes 09/26/2021   Low back pain 09/26/2021   Renal cell carcinoma (HCC) 09/26/2021   Essential hypertension 09/26/2021   Cerebrovascular disease 09/26/2021   Transient cerebral ischemic attack, unspecified 09/26/2021   Cerebrovascular disease, unspecified 09/26/2021   Transient ischemic attack 09/26/2021   Unstable angina (HCC)    Lumbar burst fracture (HCC) 05/04/2020   Hypogonadism male 10/10/2019   Persistent atrial fibrillation (HCC) 01/12/2019   Acute low back pain 11/04/2017   Abnormal screening cardiac CT  Visit for monitoring Tikosyn  therapy 09/09/2017   History of TIA (transient ischemic attack) 08/29/2016   Paroxysmal atrial fibrillation (HCC) 07/23/2016   CAD (coronary artery disease) 07/23/2016   Snores 06/27/2016   S/P placement of cardiac pacemaker 06/25/2016   Hemiparesis (HCC) 06/25/2016   Acute ischemic stroke (HCC)    Left-sided weakness    Hemisensory loss    Dysarthria    Stroke-like symptoms    Complete heart block (HCC)    Pain in thoracic spine    Orthostasis    Lethargy    Vertebral artery occlusion, left    TIA (transient ischemic attack) 06/14/2016   Arthritis 02/06/2015   Esophageal reflux 02/06/2015   Arthritis urica 02/06/2015   Cannot sleep 02/06/2015   Arthritis, degenerative 02/06/2015   Adenocarcinoma, renal cell (HCC) 02/06/2015   Benign prostatic hyperplasia with lower urinary tract symptoms 11/21/2014   Personal history of other malignant neoplasm of kidney 11/21/2014   History of Lyme disease 05/06/2014   Palpitations 12/31/2013   Hyperlipidemia 11/02/2010   HYPERTENSION, BENIGN 04/16/2010    ONSET DATE: 05/06/2016   REFERRING DIAG:  R20.0 (ICD-10-CM) - Anesthesia of skin  R29.90 (ICD-10-CM) - Unspecified symptoms and signs involving the nervous system  G45.9 (ICD-10-CM) - Transient  cerebral ischemic attack, unspecified    THERAPY DIAG:  No diagnosis found.  Rationale for Evaluation and Treatment: Rehabilitation  SUBJECTIVE:                                                                                                                                                                                             SUBJECTIVE STATEMENT: TODAY: Patient reports back pain is slowly improving. States it was bothering me yesterday but better today.  From EVAL: Pt accompanied by spouse who was a Interior and spatial designer of women's care at the hospital.  Pt reported he had a stroke in 2018 and a stent placed in June of 2025.  Pt notes that after the stint being placed, he felt numbness on the L side of the body.  Pt reports some weakness in the L LE, but more of the symptoms were found to be in the UE.    Pt had a prostate surgery back in 2022, and was seeing Shin-Yiing for issues in the sacroilliac region.    Pt accompanied by: significant other  PERTINENT HISTORY: Pt has prior history of pacemaker, heart block, stints, and stroke.    PAIN:  Are you having pain? No  PRECAUTIONS: Fall and ICD/Pacemaker  RED FLAGS: Cauda equina syndrome: Yes: pt reporting some incontinence issues and numbness and tingling in the genital region.  WEIGHT BEARING RESTRICTIONS: No  FALLS: Has patient fallen in last 6 months? Yes. Number of falls 2 falls in April.  Pt fell and broke ribs back in April, but has had more near miss falls lately due to the L LE giving out.    LIVING ENVIRONMENT: Lives with: lives with their spouse Lives in: House/apartment Stairs: Yes: External: 2 steps; none Has following equipment at home: None  PLOF: Independent  PATIENT GOALS: To have a normal walking gait, and reduce pain in the low back.  Increase flexibility.  OBJECTIVE:  Note: Objective measures were completed at Evaluation unless otherwise noted.  DIAGNOSTIC FINDINGS:    EXAM: CT HEAD WITHOUT  CONTRAST  IMPRESSION: 1.  No evidence of an acute intracranial abnormality. 2. Parenchymal atrophy and chronic small vessel ischemic disease.   COGNITION: Overall cognitive status: Within functional limits for tasks assessed   SENSATION: WFL  COORDINATION: WFL   POSTURE: rounded shoulders, forward head, decreased lumbar lordosis, and increased thoracic kyphosis  LOWER EXTREMITY ROM:     Active  Right Eval Left Eval  Hip flexion    Hip extension    Hip abduction    Hip adduction    Hip internal rotation    Hip external rotation    Knee flexion    Knee extension    Ankle dorsiflexion    Ankle plantarflexion    Ankle inversion    Ankle eversion     (Blank rows = not tested)  LOWER EXTREMITY MMT:    MMT Right Eval Left Eval  Hip flexion 5 4  Hip extension    Hip abduction 5 4-  Hip adduction 5 4-  Hip internal rotation    Hip external rotation    Knee flexion 4+ 4-  Knee extension 4 4-  Ankle dorsiflexion 5 5  Ankle plantarflexion    Ankle inversion    Ankle eversion    (Blank rows = not tested)  BED MOBILITY:  Not tested  STAIRS: Not tested  FUNCTIONAL TESTS:  5 times sit to stand: 8.80 sec  Timed up and go (TUG): 8.61  miniBest Test: TBD  Single LE Balance Test: TBD  Dual-task Goal: TBD  Functional gait assessment: TBD  PATIENT SURVEYS:  ABC scale: The Activities-Specific Balance Confidence (ABC) Scale 0% 10 20 30  40 50 60 70 80 90 100% No confidence<->completely confident  "How confident are you that you will not lose your balance or become unsteady when you . . .   Date tested 12/15/23   Walk around the house 90%  2. Walk up or down stairs 90%  3. Bend over and pick up a slipper from in front of a closet floor 90%  4. Reach for a small can off a shelf at eye level 100%  5. Stand on tip toes and reach for something above your head 100%  6. Stand on a chair and reach for something 80%  7. Sweep the floor 90%  8. Walk outside the  house to a car parked in the driveway 90%  9. Get into or out of a car 80%  10. Walk across a parking lot to the mall 90%  11. Walk up or down a ramp 80%  12. Walk in a crowded mall where people rapidly walk past you 90%  13. Are bumped into by people as you walk through the mall 80%  14. Step onto or off of an escalator while you are holding onto the railing 100%  15. Step onto or off an escalator while holding onto parcels such that you cannot hold onto the railing 80%  16. Walk outside on icy sidewalks 90%  Total: 16/16  88.75%                                                                                                                                 TREATMENT DATE: 01/29/24  TA- To improve functional movements patterns for everyday tasks   Nustep with moist heat on T spine x 6 min level 4 for CV fitness, reciprocal movement training and pain reduction in T spine area of discomfort   TE- To improve strength, endurance, mobility, and function of specific targeted muscle groups or improve joint range of motion or improve muscle flexibility  Seated red p ball roll out for postural extension and end range UE flexion stretch x 10 reps x 3 sec hold  NMR:   Side step onto airex 2 x 10 ea LE- difficulty with coordination and foot clearance both sides   Dynamic high knee march standing on airex pad  (VC to perform slowly for more control with balance) 2*10 alt LE.  Forward lean with LUE reaching- while dynamically swinging RLE back into hip ext x 10 reps then switch for 10 more reps (more unstable with Standing RLE).     Rockerboard a/p - initially attempting static stand with equal weight bearing- hold 30 sec x 3 then progress to dynamic weight shifting A/P x 20  with no  UE Support   TA- To improve functional movements patterns for everyday tasks    Standing heel to toe raises 2 x 15- instructed he could complete these at home as well.   PATIENT EDUCATION: Education details: Pt  educated on role of PT and services provided during current POC, along with prognosis and information about the clinic. Person educated: Patient and Spouse Education method: Explanation Education comprehension: verbalized understanding  HOME EXERCISE PROGRAM: Access Code: [email or web address] URL: [email or web address] Date: 12/17/2023 Prepared by: Reyes London  Exercises - Sidelying Hip Abduction with Resistance at Ankle  - 3 x weekly - 3 sets - 10 reps - Clamshell with Resistance  - 3 x weekly - 3 sets - 10 reps - Tandem Walking with Counter Support  - 3 x weekly - 3 sets - 10 reps    Access Code: [email or web address] URL: [email or web address] Date: 12/15/2023 Prepared by: Sidra Simpers  Program Notes **Be sure to perform all exercises next to a countertop or sturdy piece of furniture in case you become unsteady.  Exercises - Standing Heel Raise with Support  - 1 x daily - 7 x weekly - 2 sets - 15 reps - Standing Toe Raises at Chair  - 1 x daily - 7 x weekly - 2 sets - 15 reps - Mini Squat with Counter Support  - 1 x daily - 7 x weekly - 2 sets - 15 reps - Lunge  with Counter Support  - 1 x daily - 7 x weekly - 2 sets - 15 reps - Standing Tandem Balance with Counter Support  - 1 x daily - 7 x weekly - 2 sets - 2 reps - 30 hold - Standing Single Leg Stance with Counter Support  - 1 x daily - 7 x weekly - 2 sets - 2 reps - 30 hold  GOALS: Goals reviewed with patient? Yes  SHORT TERM GOALS: Target date: 01/12/2024  Pt will be independent with HEP in order to demonstrate increased ability to perform tasks related to occupation/hobbies. Baseline: Goal status: INITIAL   LONG TERM GOALS: Target date: 03/08/2024  Pt will improve ABC by at least 8% in order to demonstrate improvement in balance confidence. Baseline: 88.75% Goal status: INITIAL   2.  Patient will increase Berg Balance score by > 6 points to demonstrate decreased fall risk during functional  activities. Baseline: 12/17/2023= 48/56 Goal status: INITIAL  3.  Patient will increase FGA score by > 2 points to demonstrate decreased fall risk during functional activities. Baseline: 12/17/2023= 27/30 Goal status: Revised  4.  Patient will increase six minute walk test distance to >1400 for progression to community ambulator and improve gait ability Baseline: TBD Goal status: INITIAL    ASSESSMENT:  CLINICAL IMPRESSION:  Patient is a 89 y.o. male who was seen today for physical therapy  treatment for imbalance. Pt having increased mid back pain this date; was able to improve it with moist heat and gentle motion with nustep and p ball roll out. Continued with activities to improve balance and mobility but at decreased quantity today. Pt still has trouble with coordination and post LOB at times.  Pt will continue to benefit from skilled physical therapy intervention to address impairments, improve QOL, and attain therapy goals.    OBJECTIVE IMPAIRMENTS: Abnormal gait, decreased activity tolerance, decreased balance, decreased coordination, decreased endurance, decreased mobility, difficulty walking, decreased strength, decreased safety awareness, and impaired sensation.   ACTIVITY LIMITATIONS: carrying, lifting, bending, and stairs  PARTICIPATION LIMITATIONS: cleaning, community activity, and yard work  PERSONAL FACTORS: Age, Past/current experiences, and 3+ comorbidities: TIA, pacemaker, low back pain, thoracic back pain, L sided weakness are also affecting patient's functional outcome.   REHAB POTENTIAL: Good  CLINICAL DECISION MAKING: Stable/uncomplicated  EVALUATION COMPLEXITY: Low  PLAN:  PT FREQUENCY: 1-2x/week  PT DURATION: 12 weeks  PLANNED INTERVENTIONS: 97750- Physical Performance Testing, 97110-Therapeutic exercises, 97530- Therapeutic activity, [id number]- Neuromuscular re-education, 97535- Self Care, [id number]- Manual therapy, [phone]- Gait training, Joint mobilization,  Spinal mobilization, and Vestibular training  PLAN FOR NEXT SESSION:  High level dynamic balance activities.  dual task balance activities Progressive LE strengthening.     Lonni KATHEE Gainer PT  Physical Therapist- [place]  [facility]   1:31 PM 01/29/24

## 2024-01-30 DIAGNOSIS — I679 Cerebrovascular disease, unspecified: Secondary | ICD-10-CM | POA: Diagnosis not present

## 2024-01-30 DIAGNOSIS — E785 Hyperlipidemia, unspecified: Secondary | ICD-10-CM | POA: Diagnosis not present

## 2024-01-30 DIAGNOSIS — I442 Atrioventricular block, complete: Secondary | ICD-10-CM | POA: Diagnosis not present

## 2024-01-30 DIAGNOSIS — Z8673 Personal history of transient ischemic attack (TIA), and cerebral infarction without residual deficits: Secondary | ICD-10-CM | POA: Diagnosis not present

## 2024-01-30 DIAGNOSIS — I495 Sick sinus syndrome: Secondary | ICD-10-CM | POA: Diagnosis not present

## 2024-01-30 DIAGNOSIS — Z5181 Encounter for therapeutic drug level monitoring: Secondary | ICD-10-CM | POA: Diagnosis not present

## 2024-01-30 DIAGNOSIS — I2510 Atherosclerotic heart disease of native coronary artery without angina pectoris: Secondary | ICD-10-CM | POA: Diagnosis not present

## 2024-01-30 DIAGNOSIS — R931 Abnormal findings on diagnostic imaging of heart and coronary circulation: Secondary | ICD-10-CM | POA: Diagnosis not present

## 2024-01-30 DIAGNOSIS — I5040 Unspecified combined systolic (congestive) and diastolic (congestive) heart failure: Secondary | ICD-10-CM | POA: Diagnosis not present

## 2024-01-30 DIAGNOSIS — I639 Cerebral infarction, unspecified: Secondary | ICD-10-CM | POA: Diagnosis not present

## 2024-01-30 DIAGNOSIS — I4891 Unspecified atrial fibrillation: Secondary | ICD-10-CM | POA: Diagnosis not present

## 2024-01-30 DIAGNOSIS — I5022 Chronic systolic (congestive) heart failure: Secondary | ICD-10-CM | POA: Diagnosis not present

## 2024-01-30 DIAGNOSIS — Z950 Presence of cardiac pacemaker: Secondary | ICD-10-CM | POA: Diagnosis not present

## 2024-01-30 DIAGNOSIS — I10 Essential (primary) hypertension: Secondary | ICD-10-CM | POA: Diagnosis not present

## 2024-01-30 DIAGNOSIS — Z79899 Other long term (current) drug therapy: Secondary | ICD-10-CM | POA: Diagnosis not present

## 2024-02-02 ENCOUNTER — Ambulatory Visit: Admitting: Physical Therapy

## 2024-02-02 ENCOUNTER — Ambulatory Visit

## 2024-02-02 DIAGNOSIS — M533 Sacrococcygeal disorders, not elsewhere classified: Secondary | ICD-10-CM | POA: Diagnosis not present

## 2024-02-02 DIAGNOSIS — M6281 Muscle weakness (generalized): Secondary | ICD-10-CM

## 2024-02-02 DIAGNOSIS — R293 Abnormal posture: Secondary | ICD-10-CM

## 2024-02-02 DIAGNOSIS — G8929 Other chronic pain: Secondary | ICD-10-CM | POA: Diagnosis not present

## 2024-02-02 DIAGNOSIS — R2689 Other abnormalities of gait and mobility: Secondary | ICD-10-CM | POA: Diagnosis not present

## 2024-02-02 DIAGNOSIS — M545 Low back pain, unspecified: Secondary | ICD-10-CM | POA: Diagnosis not present

## 2024-02-02 DIAGNOSIS — R278 Other lack of coordination: Secondary | ICD-10-CM | POA: Diagnosis not present

## 2024-02-02 NOTE — Therapy (Signed)
 OUTPATIENT PHYSICAL THERAPY NEURO TREATMENT   Patient Name: Thomas [name] MRN: [id number] DOB:[birth year], 89 y.o., male Today's Date: 12/15/2023   PCP: [name], MD  REFERRING PROVIDER: Darron Deatrice LABOR, MD    PT End of Session - 02/02/24 1030     Visit Number 8    Number of Visits 25    Date for Recertification  03/08/24    Progress Note Due on Visit 10    PT Start Time 1019    PT Stop Time 1057    PT Time Calculation (min) 38 min    Equipment Utilized During Treatment Gait belt    Activity Tolerance Patient tolerated treatment well    Behavior During Therapy [facility] for tasks assessed/performed               Past Medical History:  Diagnosis Date   Aortic insufficiency    a. 07/2023 Echo: Mod AI; b. 09/2023 Echo: Mild-mod AI.   Aortic valve disorders    Arthritis    Basal cell carcinoma    face, nose left shoulder, left arm (06/19/2016)   Basal cell carcinoma 09/13/2020   right temple   BBB (bundle branch block)    hx right   CAD (coronary artery disease)    a. 10/2017 Cath: mod, nonobs dzs; b. 02/2021 Cath: mod, nonobs dzs; c. 11/2022 Cath: LAD 30ost/p, 58m, D1 min irregs, LCX 65p/m, OM1 min irregs, OM2 40, OM3 nl, RCA 30p, 37m, 40d w/ 40 in side branch-->Med rx.   Chronic back pain    neck, thoracic, lower back (06/19/2016)   CKD (chronic kidney disease), stage III (HCC)    Complete heart block (HCC) 06/2016   a. 06/2016 s/p MDT PPM; b. 01/2023 s/p upgrade to MDT CRT-P (ser # [id number]).   GERD (gastroesophageal reflux disease)    Gout    Heart block    I've had type I, II Wenke before now (06/19/2016)   History of gout    History of hiatal hernia    self dx'd (06/19/2016)   Hyperlipidemia    Hypertension    Lyme disease    dx'd by me 2003; cx's showed dx 08/2015   Migraine    3-4/year (06/19/2016)   Mitral regurgitation    a. 07/2023 Echo: Mod MR.   NICM (nonischemic cardiomyopathy - in setting of RV pacing) (HCC)    a. 02/2019 Echo: EF  45-50%; b. 10/2022 Echo: EF 30-35%; c. 01/2023 CRT-P upgrade; e. 07/2023 Echo: EF 40-45%, glob HK; f. 10/2023 Echo: EF of 55 to 60% with grade 1 DD, nl RV fxn, triv MR, mild to mod AI, and mildly dilated ascending aorta at 42 mm.   PAF (paroxysmal atrial fibrillation) (HCC)    a. CHA2DS2VASc = 7--> eliquis ; b. On tikosyn .   Paroxysmal atrial flutter (HCC)    Presence of permanent cardiac pacemaker    PVC's (premature ventricular contractions)    Renal cancer, left (HCC) 2006   S/P cryotherapy   Spinal stenosis    cervical, 1 thoracic, lumbar (06/19/2016)   Squamous carcinoma    face, nose left shoulder, left arm (06/19/2016)   Stroke (HCC)    TIA (transient ischemic attack) 06/14/2016   I'm not sure that's what it was (06/25/2016)   Visit for monitoring Tikosyn  therapy 09/09/2017   Past Surgical History:  Procedure Laterality Date   ANKLE FRACTURE SURGERY Right 1967   BACK SURGERY  05/07/2020   BASAL CELL CARCINOMA EXCISION     face,  nose left shoulder, left arm   BIOPSY PROSTATE  2001 & 2003   BIV UPGRADE N/A 01/10/2023   Procedure: BIV UPGRADE;  Surgeon: Fernande Elspeth BROCKS, MD;  Location: [facility] INVASIVE CV LAB;  Service: Cardiovascular;  Laterality: N/A;   CARDIAC CATHETERIZATION  1990's   CARDIOVERSION N/A 09/11/2017   Procedure: CARDIOVERSION;  Surgeon: Pietro Redell RAMAN, MD;  Location: [facility] ENDOSCOPY;  Service: Cardiovascular;  Laterality: N/A;   CORONARY STENT INTERVENTION N/A 11/10/2023   Procedure: CORONARY STENT INTERVENTION;  Surgeon: Darron Deatrice LABOR, MD;  Location: [facility] INVASIVE CV LAB;  Service: Cardiovascular;  Laterality: N/A;   FRACTURE SURGERY     HOLEP-LASER ENUCLEATION OF THE PROSTATE WITH MORCELLATION N/A 07/10/2020   Procedure: HOLEP-LASER ENUCLEATION OF THE PROSTATE WITH MORCELLATION;  Surgeon: Penne Knee, MD;  Location: [facility] ORS;  Service: Urology;  Laterality: N/A;   INGUINAL HERNIA REPAIR Left 2012   INSERT / REPLACE / REMOVE PACEMAKER  06/19/2016   LAPAROSCOPIC  ABLATION RENAL MASS     LEFT HEART CATH AND CORONARY ANGIOGRAPHY Left 10/23/2017   Procedure: LEFT HEART CATH AND CORONARY ANGIOGRAPHY;  Surgeon: Darron Deatrice LABOR, MD;  Location: [facility] INVASIVE CV LAB;  Service: Cardiovascular;  Laterality: Left;   LEFT HEART CATH AND CORONARY ANGIOGRAPHY Left 02/26/2021   Procedure: LEFT HEART CATH AND CORONARY ANGIOGRAPHY;  Surgeon: Darron Deatrice LABOR, MD;  Location: [facility] INVASIVE CV LAB;  Service: Cardiovascular;  Laterality: Left;   LEFT HEART CATH AND CORONARY ANGIOGRAPHY Left 11/10/2023   Procedure: LEFT HEART CATH AND CORONARY ANGIOGRAPHY;  Surgeon: Darron Deatrice LABOR, MD;  Location: [facility] INVASIVE CV LAB;  Service: Cardiovascular;  Laterality: Left;   PACEMAKER IMPLANT N/A 06/19/2016   Procedure: Pacemaker Implant;  Surgeon: Elspeth BROCKS Fernande, MD;  Location: [facility] INVASIVE CV LAB;  Service: Cardiovascular;  Laterality: N/A;   pacemasker     PROSTATE SURGERY     RIGHT/LEFT HEART CATH AND CORONARY ANGIOGRAPHY Bilateral 11/25/2022   Procedure: RIGHT/LEFT HEART CATH AND CORONARY ANGIOGRAPHY;  Surgeon: Darron Deatrice LABOR, MD;  Location: [facility] INVASIVE CV LAB;  Service: Cardiovascular;  Laterality: Bilateral;   SQUAMOUS CELL CARCINOMA EXCISION     face, nose left shoulder, left arm   TONSILLECTOMY AND ADENOIDECTOMY     Patient Active Problem List   Diagnosis Date Noted   Angina pectoris (HCC) 11/10/2023   Weakness of left foot 10/29/2023   CKD stage 3b, GFR 30-44 ml/min (HCC) 10/29/2023   Obstructive sleep apnea 10/29/2023   Combined systolic and diastolic congestive heart failure (HCC) 10/29/2023   AKI (acute kidney injury) (HCC) 04/29/2023   UTI (urinary tract infection) 04/29/2023   Chronic systolic heart failure (HCC) 11/25/2022   Sick sinus syndrome (HCC) 02/26/2022   Closed fracture of second lumbar vertebra (HCC) 09/26/2021   Dysphagia, unspecified 09/26/2021   Heart block 09/26/2021   Heartburn 09/26/2021   History of lumbar fusion 09/26/2021    Inflammation of sacroiliac joint (HCC) 09/26/2021   Other symptoms and signs involving cognitive functions and awareness 09/26/2021   Prediabetes 09/26/2021   Low back pain 09/26/2021   Renal cell carcinoma (HCC) 09/26/2021   Essential hypertension 09/26/2021   Cerebrovascular disease 09/26/2021   Transient cerebral ischemic attack, unspecified 09/26/2021   Cerebrovascular disease, unspecified 09/26/2021   Transient ischemic attack 09/26/2021   Unstable angina (HCC)    Lumbar burst fracture (HCC) 05/04/2020   Hypogonadism male 10/10/2019   Persistent atrial fibrillation (HCC) 01/12/2019   Acute low back pain 11/04/2017   Abnormal screening cardiac  CT    Visit for monitoring Tikosyn  therapy 09/09/2017   History of TIA (transient ischemic attack) 08/29/2016   Paroxysmal atrial fibrillation (HCC) 07/23/2016   CAD (coronary artery disease) 07/23/2016   Snores 06/27/2016   S/P placement of cardiac pacemaker 06/25/2016   Hemiparesis (HCC) 06/25/2016   Acute ischemic stroke (HCC)    Left-sided weakness    Hemisensory loss    Dysarthria    Stroke-like symptoms    Complete heart block (HCC)    Pain in thoracic spine    Orthostasis    Lethargy    Vertebral artery occlusion, left    TIA (transient ischemic attack) 06/14/2016   Arthritis 02/06/2015   Esophageal reflux 02/06/2015   Arthritis urica 02/06/2015   Cannot sleep 02/06/2015   Arthritis, degenerative 02/06/2015   Adenocarcinoma, renal cell (HCC) 02/06/2015   Benign prostatic hyperplasia with lower urinary tract symptoms 11/21/2014   Personal history of other malignant neoplasm of kidney 11/21/2014   History of Lyme disease 05/06/2014   Palpitations 12/31/2013   Hyperlipidemia 11/02/2010   HYPERTENSION, BENIGN 04/16/2010    ONSET DATE: 05/06/2016   REFERRING DIAG:  R20.0 (ICD-10-CM) - Anesthesia of skin  R29.90 (ICD-10-CM) - Unspecified symptoms and signs involving the nervous system  G45.9 (ICD-10-CM) - Transient  cerebral ischemic attack, unspecified    THERAPY DIAG:  No diagnosis found.  Rationale for Evaluation and Treatment: Rehabilitation  SUBJECTIVE:                                                                                                                                                                                             SUBJECTIVE STATEMENT: TODAY: Patient reports back continued back pain in new place, did feel better afer last session.   From EVAL: Pt accompanied by spouse who was a Interior and spatial designer of women's care at the hospital.  Pt reported he had a stroke in 2018 and a stent placed in June of 2025.  Pt notes that after the stint being placed, he felt numbness on the L side of the body.  Pt reports some weakness in the L LE, but more of the symptoms were found to be in the UE.    Pt had a prostate surgery back in 2022, and was seeing Shin-Yiing for issues in the sacroilliac region.    Pt accompanied by: significant other  PERTINENT HISTORY: Pt has prior history of pacemaker, heart block, stints, and stroke.    PAIN:  Are you having pain? No  PRECAUTIONS: Fall and ICD/Pacemaker  RED FLAGS: Cauda equina syndrome: Yes: pt reporting some incontinence issues and numbness and tingling in the genital  region.     WEIGHT BEARING RESTRICTIONS: No  FALLS: Has patient fallen in last 6 months? Yes. Number of falls 2 falls in April.  Pt fell and broke ribs back in April, but has had more near miss falls lately due to the L LE giving out.    LIVING ENVIRONMENT: Lives with: lives with their spouse Lives in: House/apartment Stairs: Yes: External: 2 steps; none Has following equipment at home: None  PLOF: Independent  PATIENT GOALS: To have a normal walking gait, and reduce pain in the low back.  Increase flexibility.  OBJECTIVE:  Note: Objective measures were completed at Evaluation unless otherwise noted.  DIAGNOSTIC FINDINGS:    EXAM: CT HEAD WITHOUT  CONTRAST  IMPRESSION: 1.  No evidence of an acute intracranial abnormality. 2. Parenchymal atrophy and chronic small vessel ischemic disease.   COGNITION: Overall cognitive status: Within functional limits for tasks assessed   SENSATION: WFL  COORDINATION: WFL   POSTURE: rounded shoulders, forward head, decreased lumbar lordosis, and increased thoracic kyphosis  LOWER EXTREMITY ROM:     Active  Right Eval Left Eval  Hip flexion    Hip extension    Hip abduction    Hip adduction    Hip internal rotation    Hip external rotation    Knee flexion    Knee extension    Ankle dorsiflexion    Ankle plantarflexion    Ankle inversion    Ankle eversion     (Blank rows = not tested)  LOWER EXTREMITY MMT:    MMT Right Eval Left Eval  Hip flexion 5 4  Hip extension    Hip abduction 5 4-  Hip adduction 5 4-  Hip internal rotation    Hip external rotation    Knee flexion 4+ 4-  Knee extension 4 4-  Ankle dorsiflexion 5 5  Ankle plantarflexion    Ankle inversion    Ankle eversion    (Blank rows = not tested)  BED MOBILITY:  Not tested  STAIRS: Not tested  FUNCTIONAL TESTS:  5 times sit to stand: 8.80 sec  Timed up and go (TUG): 8.61  miniBest Test: TBD  Single LE Balance Test: TBD  Dual-task Goal: TBD  Functional gait assessment: TBD  PATIENT SURVEYS:  ABC scale: The Activities-Specific Balance Confidence (ABC) Scale 0% 10 20 30  40 50 60 70 80 90 100% No confidence<->completely confident  "How confident are you that you will not lose your balance or become unsteady when you . . .   Date tested 12/15/23   Walk around the house 90%  2. Walk up or down stairs 90%  3. Bend over and pick up a slipper from in front of a closet floor 90%  4. Reach for a small can off a shelf at eye level 100%  5. Stand on tip toes and reach for something above your head 100%  6. Stand on a chair and reach for something 80%  7. Sweep the floor 90%  8. Walk outside the  house to a car parked in the driveway 90%  9. Get into or out of a car 80%  10. Walk across a parking lot to the mall 90%  11. Walk up or down a ramp 80%  12. Walk in a crowded mall where people rapidly walk past you 90%  13. Are bumped into by people as you walk through the mall 80%  14. Step onto or off of an escalator while you are holding  onto the railing 100%  15. Step onto or off an escalator while holding onto parcels such that you cannot hold onto the railing 80%  16. Walk outside on icy sidewalks 90%  Total: 16/16  88.75%                                                                                                                                 TREATMENT DATE: 02/02/24  TA- To improve functional movements patterns for everyday tasks   Nustep with moist heat on T spine x 8 min level 4 for CV fitness, reciprocal movement training and pain reduction in T spine area of discomfort   TE- To improve strength, endurance, mobility, and function of specific targeted muscle groups or improve joint range of motion or improve muscle flexibility  Seated red p ball roll out for postural extension and end range UE flexion stretch x 10 reps x 3 sec hold  NMR:    Rockerboard a/p - initially attempting static stand with equal weight bearing- hold 30 sec x 3 then progress to dynamic weight shifting A/P x 20  with no  UE Support   Post LOB when walking backwards towards chair- minA from PT to guide to chair in seated.   Activity Description: blaze pod taps antero and posterolateral standing on airex  Activity Setting:  random Number of Pods:  4 Cycles/Sets:  3 Duration (Time or Hit Count):  1 min  Comments: last set no airex pad and ll pods on left side, most LOB to the left with reaction stepping, close CGA to min A at times for balance recovery    TA- To improve functional movements patterns for everyday tasks   Standing heel to toe raises 3 x 15- instructed he could complete these  at home as well.   PATIENT EDUCATION: Education details: Pt educated on role of PT and services provided during current POC, along with prognosis and information about the clinic. Person educated: Patient and Spouse Education method: Explanation Education comprehension: verbalized understanding  HOME EXERCISE PROGRAM: Access Code: [email or web address] URL: [email or web address] Date: 12/17/2023 Prepared by: Reyes London  Exercises - Sidelying Hip Abduction with Resistance at Ankle  - 3 x weekly - 3 sets - 10 reps - Clamshell with Resistance  - 3 x weekly - 3 sets - 10 reps - Tandem Walking with Counter Support  - 3 x weekly - 3 sets - 10 reps    Access Code: [email or web address] URL: [email or web address] Date: 12/15/2023 Prepared by: Sidra Simpers  Program Notes **Be sure to perform all exercises next to a countertop or sturdy piece of furniture in case you become unsteady.  Exercises - Standing Heel Raise with Support  - 1 x daily - 7 x weekly - 2 sets - 15 reps - Standing Toe Raises at Chair  - 1 x daily - 7 x weekly - 2 sets - 15 reps - Mini Squat with Counter Support  -  1 x daily - 7 x weekly - 2 sets - 15 reps - Lunge with Counter Support  - 1 x daily - 7 x weekly - 2 sets - 15 reps - Standing Tandem Balance with Counter Support  - 1 x daily - 7 x weekly - 2 sets - 2 reps - 30 hold - Standing Single Leg Stance with Counter Support  - 1 x daily - 7 x weekly - 2 sets - 2 reps - 30 hold  GOALS: Goals reviewed with patient? Yes  SHORT TERM GOALS: Target date: 01/12/2024  Pt will be independent with HEP in order to demonstrate increased ability to perform tasks related to occupation/hobbies. Baseline: Goal status: INITIAL   LONG TERM GOALS: Target date: 03/08/2024  Pt will improve ABC by at least 8% in order to demonstrate improvement in balance confidence. Baseline: 88.75% Goal status: INITIAL   2.  Patient will increase Berg Balance score by > 6 points to  demonstrate decreased fall risk during functional activities. Baseline: 12/17/2023= 48/56 Goal status: INITIAL  3.  Patient will increase FGA score by > 2 points to demonstrate decreased fall risk during functional activities. Baseline: 12/17/2023= 27/30 Goal status: Revised  4.  Patient will increase six minute walk test distance to >1400 for progression to community ambulator and improve gait ability Baseline: TBD Goal status: INITIAL    ASSESSMENT:  CLINICAL IMPRESSION:  Patient is a 89 y.o. male who was seen today for physical therapy  treatment for imbalance. Pt having increased mid back pain this date; was able to improve it with moist heat and gentle motion with nustep and p ball roll out. Continued with activities to improve balance and mobility and to improve fall reactions.  Pt still has trouble with coordination and post and lateral LOB at times.  Pt will continue to benefit from skilled physical therapy intervention to address impairments, improve QOL, and attain therapy goals.   OBJECTIVE IMPAIRMENTS: Abnormal gait, decreased activity tolerance, decreased balance, decreased coordination, decreased endurance, decreased mobility, difficulty walking, decreased strength, decreased safety awareness, and impaired sensation.   ACTIVITY LIMITATIONS: carrying, lifting, bending, and stairs  PARTICIPATION LIMITATIONS: cleaning, community activity, and yard work  PERSONAL FACTORS: Age, Past/current experiences, and 3+ comorbidities: TIA, pacemaker, low back pain, thoracic back pain, L sided weakness are also affecting patient's functional outcome.   REHAB POTENTIAL: Good  CLINICAL DECISION MAKING: Stable/uncomplicated  EVALUATION COMPLEXITY: Low  PLAN:  PT FREQUENCY: 1-2x/week  PT DURATION: 12 weeks  PLANNED INTERVENTIONS: 97750- Physical Performance Testing, 97110-Therapeutic exercises, 97530- Therapeutic activity, [id number]- Neuromuscular re-education, 97535- Self Care, [id number]-  Manual therapy, [phone]- Gait training, Joint mobilization, Spinal mobilization, and Vestibular training  PLAN FOR NEXT SESSION:  High level dynamic balance activities.  dual task balance activities Progressive LE strengthening.   Lonni KATHEE Gainer PT  Physical Therapist- [facility]  [facility]   10:31 AM 02/02/24

## 2024-02-03 ENCOUNTER — Encounter: Payer: Self-pay | Admitting: Cardiovascular Disease

## 2024-02-03 ENCOUNTER — Ambulatory Visit (INDEPENDENT_AMBULATORY_CARE_PROVIDER_SITE_OTHER)

## 2024-02-03 ENCOUNTER — Ambulatory Visit: Attending: Cardiovascular Disease | Admitting: Cardiovascular Disease

## 2024-02-03 VITALS — BP 138/80 | HR 65 | Ht 65.0 in | Wt 157.0 lb

## 2024-02-03 DIAGNOSIS — I10 Essential (primary) hypertension: Secondary | ICD-10-CM | POA: Diagnosis not present

## 2024-02-03 DIAGNOSIS — I2510 Atherosclerotic heart disease of native coronary artery without angina pectoris: Secondary | ICD-10-CM

## 2024-02-03 DIAGNOSIS — E785 Hyperlipidemia, unspecified: Secondary | ICD-10-CM

## 2024-02-03 DIAGNOSIS — G459 Transient cerebral ischemic attack, unspecified: Secondary | ICD-10-CM

## 2024-02-03 DIAGNOSIS — I5022 Chronic systolic (congestive) heart failure: Secondary | ICD-10-CM | POA: Diagnosis not present

## 2024-02-03 DIAGNOSIS — I4819 Other persistent atrial fibrillation: Secondary | ICD-10-CM

## 2024-02-03 MED ORDER — ATORVASTATIN CALCIUM 80 MG PO TABS: 80.0000 mg | ORAL_TABLET | Freq: Every day | ORAL | 3 refills | Status: AC

## 2024-02-03 NOTE — Patient Instructions (Signed)
 Medication Instructions:  No changes *If you need a refill on your cardiac medications before your next appointment, please call your pharmacy*  Lab Work: None ordered If you have labs (blood work) drawn today and your tests are completely normal, you will receive your results only by: MyChart Message (if you have MyChart) OR A paper copy in the mail If you have any lab test that is abnormal or we need to change your treatment, we will call you to review the results.  Testing/Procedures: None ordered  Follow-Up: At [facility], you and your health needs are our priority.  As part of our continuing mission to provide you with exceptional heart care, our providers are all part of one team.  This team includes your primary Cardiologist (physician) and Advanced Practice Providers or APPs (Physician Assistants and Nurse Practitioners) who all work together to provide you with the care you need, when you need it.  Your next appointment:   4 month(s)  Provider:   You may see Antionette Kirks, MD or one of the following Advanced Practice Providers on your designated Care Team:   Laneta Pintos, NP Gildardo Labrador, PA-C Varney Gentleman, PA-C Cadence Iola, PA-C Ronald Cockayne, NP Morey Ar, NP    We recommend signing up for the patient portal called "MyChart".  Sign up information is provided on this After Visit Summary.  MyChart is used to connect with patients for Virtual Visits (Telemedicine).  Patients are able to view lab/test results, encounter notes, upcoming appointments, etc.  Non-urgent messages can be sent to your provider as well.   To learn more about what you can do with MyChart, go to [email or web address].
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
## 2024-02-04 ENCOUNTER — Ambulatory Visit: Attending: Cardiovascular Disease

## 2024-02-04 ENCOUNTER — Ambulatory Visit: Admitting: Physical Therapy

## 2024-02-04 DIAGNOSIS — R278 Other lack of coordination: Secondary | ICD-10-CM | POA: Insufficient documentation

## 2024-02-04 DIAGNOSIS — R293 Abnormal posture: Secondary | ICD-10-CM | POA: Diagnosis present

## 2024-02-04 DIAGNOSIS — R2689 Other abnormalities of gait and mobility: Secondary | ICD-10-CM

## 2024-02-04 DIAGNOSIS — M545 Low back pain, unspecified: Secondary | ICD-10-CM | POA: Diagnosis present

## 2024-02-04 DIAGNOSIS — M6281 Muscle weakness (generalized): Secondary | ICD-10-CM | POA: Diagnosis present

## 2024-02-04 DIAGNOSIS — G8929 Other chronic pain: Secondary | ICD-10-CM | POA: Diagnosis present

## 2024-02-04 NOTE — Therapy (Unsigned)
 OUTPATIENT OCCUPATIONAL THERAPY NEURO TREATMENT NOTE  Patient Name: Thomas [name] MRN: [id number] DOB:1933-02-26, 89 y.o., male Today's Date: 02/04/2024  PCP: Charlie Forte REFERRING PROVIDER: [name]  END OF SESSION:  OT End of Session - 02/04/24 1447     Visit Number 5    Number of Visits 24    Date for Recertification  03/08/24    OT Start Time 1103    OT Stop Time 1145    OT Time Calculation (min) 42 min    Activity Tolerance Patient tolerated treatment well    Behavior During Therapy [facility] for tasks assessed/performed         Past Medical History:  Diagnosis Date   Aortic insufficiency    a. 07/2023 Echo: Mod AI; b. 09/2023 Echo: Mild-mod AI.   Aortic valve disorders    Arthritis    Basal cell carcinoma    face, nose left shoulder, left arm (06/19/2016)   Basal cell carcinoma 09/13/2020   right temple   BBB (bundle branch block)    hx right   CAD (coronary artery disease)    a. 10/2017 Cath: mod, nonobs dzs; b. 02/2021 Cath: mod, nonobs dzs; c. 11/2022 Cath: LAD 30ost/p, 64m, D1 min irregs, LCX 65p/m, OM1 min irregs, OM2 40, OM3 nl, RCA 30p, 52m, 40d w/ 40 in side branch-->Med rx; d. 11/2023 PCI: LAD 90ost/p (3.5x15 Onyx Frontier DES). Otw stable anatomy.   Chronic back pain    neck, thoracic, lower back (06/19/2016)   CKD (chronic kidney disease), stage III (HCC)    Complete heart block (HCC) 06/2016   a. 06/2016 s/p MDT PPM; b. 01/2023 s/p upgrade to MDT CRT-P (ser # [id number]).   GERD (gastroesophageal reflux disease)    Gout    Heart block    I've had type I, II Wenke before now (06/19/2016)   History of gout    History of hiatal hernia    self dx'd (06/19/2016)   Hyperlipidemia    Hypertension    Lyme disease    dx'd by me 2003; cx's showed dx 08/2015   Migraine    3-4/year (06/19/2016)   Mitral regurgitation    a. 07/2023 Echo: Mod MR.   NICM (nonischemic cardiomyopathy - in setting of RV pacing) (HCC)    a. 02/2019 Echo: EF 45-50%; b.  10/2022 Echo: EF 30-35%; c. 01/2023 CRT-P upgrade; e. 07/2023 Echo: EF 40-45%, glob HK; f. 10/2023 Echo: EF of 55 to 60% with grade 1 DD, nl RV fxn, triv MR, mild to mod AI, and mildly dilated ascending aorta at 42 mm.   PAF (paroxysmal atrial fibrillation) (HCC)    a. CHA2DS2VASc = 7--> eliquis ; b. On tikosyn .   Paroxysmal atrial flutter (HCC)    Presence of permanent cardiac pacemaker    PVC's (premature ventricular contractions)    Renal cancer, left (HCC) 2006   S/P cryotherapy   Spinal stenosis    cervical, 1 thoracic, lumbar (06/19/2016)   Squamous carcinoma    face, nose left shoulder, left arm (06/19/2016)   Stroke (HCC)    TIA (transient ischemic attack) 06/14/2016   I'm not sure that's what it was (06/25/2016)   Visit for monitoring Tikosyn  therapy 09/09/2017   Past Surgical History:  Procedure Laterality Date   ANKLE FRACTURE SURGERY Right 1967   BACK SURGERY  05/07/2020   BASAL CELL CARCINOMA EXCISION     face, nose left shoulder, left arm   BIOPSY PROSTATE  2001 & 2003   BIV  UPGRADE N/A 01/10/2023   Procedure: BIV UPGRADE;  Surgeon: Fernande Elspeth BROCKS, MD;  Location: [facility] INVASIVE CV LAB;  Service: Cardiovascular;  Laterality: N/A;   CARDIAC CATHETERIZATION  1990's   CARDIOVERSION N/A 09/11/2017   Procedure: CARDIOVERSION;  Surgeon: Pietro Redell RAMAN, MD;  Location: [facility] ENDOSCOPY;  Service: Cardiovascular;  Laterality: N/A;   CORONARY STENT INTERVENTION N/A 11/10/2023   Procedure: CORONARY STENT INTERVENTION;  Surgeon: Darron Deatrice LABOR, MD;  Location: [facility] INVASIVE CV LAB;  Service: Cardiovascular;  Laterality: N/A;   FRACTURE SURGERY     HOLEP-LASER ENUCLEATION OF THE PROSTATE WITH MORCELLATION N/A 07/10/2020   Procedure: HOLEP-LASER ENUCLEATION OF THE PROSTATE WITH MORCELLATION;  Surgeon: Penne Knee, MD;  Location: [facility] ORS;  Service: Urology;  Laterality: N/A;   INGUINAL HERNIA REPAIR Left 2012   INSERT / REPLACE / REMOVE PACEMAKER  06/19/2016   LAPAROSCOPIC ABLATION  RENAL MASS     LEFT HEART CATH AND CORONARY ANGIOGRAPHY Left 10/23/2017   Procedure: LEFT HEART CATH AND CORONARY ANGIOGRAPHY;  Surgeon: Darron Deatrice LABOR, MD;  Location: [facility] INVASIVE CV LAB;  Service: Cardiovascular;  Laterality: Left;   LEFT HEART CATH AND CORONARY ANGIOGRAPHY Left 02/26/2021   Procedure: LEFT HEART CATH AND CORONARY ANGIOGRAPHY;  Surgeon: Darron Deatrice LABOR, MD;  Location: [facility] INVASIVE CV LAB;  Service: Cardiovascular;  Laterality: Left;   LEFT HEART CATH AND CORONARY ANGIOGRAPHY Left 11/10/2023   Procedure: LEFT HEART CATH AND CORONARY ANGIOGRAPHY;  Surgeon: Darron Deatrice LABOR, MD;  Location: [facility] INVASIVE CV LAB;  Service: Cardiovascular;  Laterality: Left;   PACEMAKER IMPLANT N/A 06/19/2016   Procedure: Pacemaker Implant;  Surgeon: Elspeth BROCKS Fernande, MD;  Location: [facility] INVASIVE CV LAB;  Service: Cardiovascular;  Laterality: N/A;   pacemasker     PROSTATE SURGERY     RIGHT/LEFT HEART CATH AND CORONARY ANGIOGRAPHY Bilateral 11/25/2022   Procedure: RIGHT/LEFT HEART CATH AND CORONARY ANGIOGRAPHY;  Surgeon: Darron Deatrice LABOR, MD;  Location: [facility] INVASIVE CV LAB;  Service: Cardiovascular;  Laterality: Bilateral;   SQUAMOUS CELL CARCINOMA EXCISION     face, nose left shoulder, left arm   TONSILLECTOMY AND ADENOIDECTOMY     Patient Active Problem List   Diagnosis Date Noted   Angina pectoris 11/10/2023   Weakness of left foot 10/29/2023   CKD stage 3b, GFR 30-44 ml/min (HCC) 10/29/2023   Obstructive sleep apnea 10/29/2023   Combined systolic and diastolic congestive heart failure (HCC) 10/29/2023   AKI (acute kidney injury) 04/29/2023   UTI (urinary tract infection) 04/29/2023   Chronic systolic heart failure (HCC) 11/25/2022   Sick sinus syndrome (HCC) 02/26/2022   Closed fracture of second lumbar vertebra (HCC) 09/26/2021   Dysphagia, unspecified 09/26/2021   Heart block 09/26/2021   Heartburn 09/26/2021   History of lumbar fusion 09/26/2021   Inflammation of sacroiliac  joint 09/26/2021   Other symptoms and signs involving cognitive functions and awareness 09/26/2021   Prediabetes 09/26/2021   Low back pain 09/26/2021   Renal cell carcinoma (HCC) 09/26/2021   Essential hypertension 09/26/2021   Cerebrovascular disease 09/26/2021   Transient cerebral ischemic attack, unspecified 09/26/2021   Cerebrovascular disease, unspecified 09/26/2021   Transient ischemic attack 09/26/2021   Unstable angina (HCC)    Lumbar burst fracture (HCC) 05/04/2020   Hypogonadism male 10/10/2019   Persistent atrial fibrillation (HCC) 01/12/2019   Acute low back pain 11/04/2017   Abnormal screening cardiac CT    Visit for monitoring Tikosyn  therapy 09/09/2017   History of TIA (transient ischemic attack) 08/29/2016  Paroxysmal atrial fibrillation (HCC) 07/23/2016   CAD (coronary artery disease) 07/23/2016   Snores 06/27/2016   S/P placement of cardiac pacemaker 06/25/2016   Hemiparesis (HCC) 06/25/2016   Acute ischemic stroke (HCC)    Left-sided weakness    Hemisensory loss    Dysarthria    Stroke-like symptoms    Complete heart block (HCC)    Pain in thoracic spine    Orthostasis    Lethargy    Vertebral artery occlusion, left    TIA (transient ischemic attack) 06/14/2016   Arthritis 02/06/2015   Esophageal reflux 02/06/2015   Arthritis urica 02/06/2015   Cannot sleep 02/06/2015   Arthritis, degenerative 02/06/2015   Adenocarcinoma, renal cell (HCC) 02/06/2015   Benign prostatic hyperplasia with lower urinary tract symptoms 11/21/2014   Personal history of other malignant neoplasm of kidney 11/21/2014   History of Lyme disease 05/06/2014   Palpitations 12/31/2013   Hyperlipidemia 11/02/2010   HYPERTENSION, BENIGN 04/16/2010   ONSET DATE: 11/10/23  REFERRING DIAG: CVA  THERAPY DIAG:  Muscle weakness (generalized)  Other lack of coordination  Rationale for Evaluation and Treatment: Rehabilitation  SUBJECTIVE:  SUBJECTIVE STATEMENT: Pt reports some  back pain today after re-injuring his thoracic spine a few weeks ago.   Pt accompanied by: self and significant other  PERTINENT HISTORY: 89 year old male who was admitted to hospital on 11/10/23 for management of unstable angina after pt developed clumsiness of the left arm after cardiac catheterization 7/7, workup for TIA. Per neurology note symptoms secondary to relative cerebral hypoperfusion in the setting of heart disease and right carotid artery and M1 stenosis. PMhx: PAF on Eliquis , HTN, HLD, prediabetes, OSA on CPAP, dominant left vertebral artery occlusion, TIA, SSS s/p PPM, BPH, renal CA, lumbar fusion, A-fib.  PRECAUTIONS: None  WEIGHT BEARING RESTRICTIONS: No  PAIN: 02/04/24: 3/10 thoracic spine Are you having pain? 4/10  back pain  FALLS: Has patient fallen in last 6 months? Yes. Number of falls 4  LIVING ENVIRONMENT: Lives with: lives with their spouse Lives in: House/apartment Stairs: Yes: Internal: 2 steps; none Has following equipment at home: Single point cane and Walker - 2 wheeled  PLOF: Independent with basic ADLs  PATIENT GOALS: to go to [facility] for more detailed neuro testing and return to function in LUE  OBJECTIVE:  Note: Objective measures were completed at Evaluation unless otherwise noted.  HAND DOMINANCE: Right  ADLs: Overall ADLs: MIN A Transfers/ambulation related to ADLs: Eating: IND - uses dominant R Grooming: SETUP UB Dressing: SETUP - buttons assist LB Dressing: MIN - hiking pants  Toileting: IND Bathing: IND Tub Shower transfers: IND Equipment: none  IADLs: Shopping: spouse completes as needed Light housekeeping: spouse completes as needed Meal Prep: assist from spouse Community mobility: SPC/RW as needed Medication management: IND Landscape architect: IND Handwriting: 100% legible R hand  MOBILITY STATUS: Independent and Hx of falls  POSTURE COMMENTS:  rounded shoulders and forward head Sitting balance: Sits without UE support  up to 30 sec  ACTIVITY TOLERANCE: Activity tolerance: IND - tolerates 20 min standing activities   FUNCTIONAL OUTCOME MEASURES:    UPPER EXTREMITY ROM:   BUE AROM WFL   UPPER EXTREMITY MMT:     MMT Right eval Left eval  Shoulder flexion    Shoulder abduction    Shoulder adduction    Shoulder extension    Shoulder internal rotation    Shoulder external rotation    Middle trapezius    Lower trapezius    Elbow flexion  Elbow extension    Wrist flexion    Wrist extension    Wrist ulnar deviation    Wrist radial deviation    Wrist pronation    Wrist supination    (Blank rows = not tested)  HAND FUNCTION: Grip strength: Right: 76 lbs; Left: 55 lbs, Lateral pinch: Right: 15 lbs, Left: 11 lbs, and 3 point pinch: Right: 21 lbs, Left: 14 lbs  COORDINATION: 9 Hole Peg test: Right: 26 sec; Left: 10 sec  SENSATION:  12/17/23 Light touch: decreased sensation and numbness in L hand 5th digit Stereognosis:   Hot/Cold:   Proprioception: intact  EDEMA: mild swelling to L hand   COGNITION: Overall cognitive status: Within functional limits for tasks assessed  VISION: Subjective report: baseline Baseline vision: No visual deficits  VISION ASSESSMENT: WFL                                                                                                                            TREATMENT DATE: 02/04/24  Self Care: Moist heat applied to T-spine for pain management throughout session -Review of spinal precautions with ADLs d/t pt concerned of possible fx from an injury ~3 weeks ago, but has not yet sought out imaging.  Encouraged imaging to determine severity of injury with any subsequent precautions to take.  Therapeutic Activity: -Facilitated LUE pinch strengthening and GMC while reaching toward a target.  Pt worked to place therapy resistant clothespins onto a horizontal and vertical dowel with reps of lateral, tip, and 3 point pinch strengthening; requiring min vc for  pinch prehension patterns. -Facilitated LUE forearm, wrist, and hand strength and coordination skills: EZ board tools: Pt worked with small and large base key turn and small and large dial turn for 2-3 reps for each tool (up/down board=1 rep).  Rest breaks between sets and min-mod vc for technique to maximize ROM with each tool rotation against wide and narrow strips of velcro resistance.  Occasional min A from OT to keep tools level on board d/t lack of coordination. -Facilitated L hand FMC/dexterity skills targeting small item pick up/storage/translatory skills working to pick up small beads, store 1-3 in hand, and discard into pill organizer 1 by 1 to simulate pill set up.  Pt then practiced removing pills from organizer 1 by 1.  PATIENT EDUCATION: Education details: spinal precautions Person educated: Patient Education method: Explanation Education comprehension: verbalized understanding  HOME EXERCISE PROGRAM: Blue theraputty [facility] - coins at tabletop  GOALS: Goals reviewed with patient? Yes  SHORT TERM GOALS: Target date: 01/26/24  Pt will IND complete HEP for functional strenghening of non-dominant LUE. Baseline: not initiated Goal status: INITIAL   LONG TERM GOALS: Target date: 03/08/24  Pt will increase L pinch strength by 5 # of force to securely hold a wrench during leisure activities. Baseline: L pinch 14# Goal status: INITIAL  2.  Pt will increase L grip strength by 15# to wash dishes without dropping items.  Baseline: L grip 55# Goal status: INITIAL  3.  Pt will improve LUE coordination by 30 seconds to independently manipulate fasteners during dressing tasks. Baseline: L 1'10.  Goal status: INITIAL  4.  Pt will improve dynamic seated balance to don B shoes independently on edge of bed without loss of balance. Baseline: requires chair with armrest to don shoes with standby assist Goal status: INITIAL   ASSESSMENT:  CLINICAL IMPRESSION: Pt reports being  consistent to engage the L hand into daily tasks for neuro re-education, though acknowledges residual weakness and lack of coordination.  Pt presents with mild-moderate ataxia with LUE reaching tasks when arm is unsupported from table top or arm rest of chair.  Pinch strength remains weak, with pt struggling with blue resistant clothespins, and black, most resistive pins.  Pt able to manage black pins only using a lateral pinch with observed substitution patterns.  Simulated pill set up with occasional dropping of beads when placing or removing beads from pill organizer, specifically when attempting to store >3 in hand at once.  Pt benefited from non-skid mat for easier pick up when working with beads.  Pt. continues to present with weakness, and decreased motor control in the left UE, and hand. Pt. continues to benefit from OT services to work on L grip and lateral pinch strength, L FMC skills, and dynamic sitting balance to improve functional independence of ADL/IADL tasks at home.   PERFORMANCE DEFICITS: in functional skills including ADLs, IADLs, coordination, dexterity, sensation, and edema and psychosocial skills including environmental adaptation.   IMPAIRMENTS: are limiting patient from ADLs, IADLs, and leisure.   CO-MORBIDITIES: may have co-morbidities  that affects occupational performance. Patient will benefit from skilled OT to address above impairments and improve overall function.  MODIFICATION OR ASSISTANCE TO COMPLETE EVALUATION: No modification of tasks or assist necessary to complete an evaluation.  OT OCCUPATIONAL PROFILE AND HISTORY: Problem focused assessment: Including review of records relating to presenting problem.  CLINICAL DECISION MAKING: LOW - limited treatment options, no task modification necessary  REHAB POTENTIAL: Good  EVALUATION COMPLEXITY: Moderate    PLAN:  OT FREQUENCY: 1-2x/week  OT DURATION: 12 weeks  PLANNED INTERVENTIONS: 97168 OT Re-evaluation,  97535 self care/ADL training, 02889 therapeutic exercise, 97530 therapeutic activity, and 97112 neuromuscular re-education  RECOMMENDED OTHER SERVICES: PT  CONSULTED AND AGREED WITH PLAN OF CARE: Patient and family member/caregiver  PLAN FOR NEXT SESSION: sensation evaluation  Inocente Blazing, MS, OTR/L   02/04/2024, 2:50 PM

## 2024-02-04 NOTE — Therapy (Signed)
 OUTPATIENT PHYSICAL THERAPY NEURO TREATMENT   Patient Name: Thomas [name] MRN: [id number] DOB:Jan 20, 1933, 89 y.o., male Today's Date: 12/15/2023   PCP: [name], MD  REFERRING PROVIDER: Darron Deatrice LABOR, MD    PT End of Session - 02/04/24 1128     Visit Number 9    Number of Visits 25    Date for Recertification  03/08/24    Progress Note Due on Visit 10    PT Start Time 1145    PT Stop Time 1225    PT Time Calculation (min) 40 min    Equipment Utilized During Treatment Gait belt    Activity Tolerance Patient tolerated treatment well    Behavior During Therapy [facility] for tasks assessed/performed               Past Medical History:  Diagnosis Date   Aortic insufficiency    a. 07/2023 Echo: Mod AI; b. 09/2023 Echo: Mild-mod AI.   Aortic valve disorders    Arthritis    Basal cell carcinoma    face, nose left shoulder, left arm (06/19/2016)   Basal cell carcinoma 09/13/2020   right temple   BBB (bundle branch block)    hx right   CAD (coronary artery disease)    a. 10/2017 Cath: mod, nonobs dzs; b. 02/2021 Cath: mod, nonobs dzs; c. 11/2022 Cath: LAD 30ost/p, 44m, D1 min irregs, LCX 65p/m, OM1 min irregs, OM2 40, OM3 nl, RCA 30p, 44m, 40d w/ 40 in side branch-->Med rx.   Chronic back pain    neck, thoracic, lower back (06/19/2016)   CKD (chronic kidney disease), stage III (HCC)    Complete heart block (HCC) 06/2016   a. 06/2016 s/p MDT PPM; b. 01/2023 s/p upgrade to MDT CRT-P (ser # [id number]).   GERD (gastroesophageal reflux disease)    Gout    Heart block    I've had type I, II Wenke before now (06/19/2016)   History of gout    History of hiatal hernia    self dx'd (06/19/2016)   Hyperlipidemia    Hypertension    Lyme disease    dx'd by me 2003; cx's showed dx 08/2015   Migraine    3-4/year (06/19/2016)   Mitral regurgitation    a. 07/2023 Echo: Mod MR.   NICM (nonischemic cardiomyopathy - in setting of RV pacing) (HCC)    a. 02/2019 Echo: EF  45-50%; b. 10/2022 Echo: EF 30-35%; c. 01/2023 CRT-P upgrade; e. 07/2023 Echo: EF 40-45%, glob HK; f. 10/2023 Echo: EF of 55 to 60% with grade 1 DD, nl RV fxn, triv MR, mild to mod AI, and mildly dilated ascending aorta at 42 mm.   PAF (paroxysmal atrial fibrillation) (HCC)    a. CHA2DS2VASc = 7--> eliquis ; b. On tikosyn .   Paroxysmal atrial flutter (HCC)    Presence of permanent cardiac pacemaker    PVC's (premature ventricular contractions)    Renal cancer, left (HCC) 2006   S/P cryotherapy   Spinal stenosis    cervical, 1 thoracic, lumbar (06/19/2016)   Squamous carcinoma    face, nose left shoulder, left arm (06/19/2016)   Stroke (HCC)    TIA (transient ischemic attack) 06/14/2016   I'm not sure that's what it was (06/25/2016)   Visit for monitoring Tikosyn  therapy 09/09/2017   Past Surgical History:  Procedure Laterality Date   ANKLE FRACTURE SURGERY Right 1967   BACK SURGERY  05/07/2020   BASAL CELL CARCINOMA EXCISION     face,  nose left shoulder, left arm   BIOPSY PROSTATE  2001 & 2003   BIV UPGRADE N/A 01/10/2023   Procedure: BIV UPGRADE;  Surgeon: Fernande Elspeth BROCKS, MD;  Location: [facility] INVASIVE CV LAB;  Service: Cardiovascular;  Laterality: N/A;   CARDIAC CATHETERIZATION  1990's   CARDIOVERSION N/A 09/11/2017   Procedure: CARDIOVERSION;  Surgeon: Pietro Redell RAMAN, MD;  Location: [facility] ENDOSCOPY;  Service: Cardiovascular;  Laterality: N/A;   CORONARY STENT INTERVENTION N/A 11/10/2023   Procedure: CORONARY STENT INTERVENTION;  Surgeon: Darron Deatrice LABOR, MD;  Location: [facility] INVASIVE CV LAB;  Service: Cardiovascular;  Laterality: N/A;   FRACTURE SURGERY     HOLEP-LASER ENUCLEATION OF THE PROSTATE WITH MORCELLATION N/A 07/10/2020   Procedure: HOLEP-LASER ENUCLEATION OF THE PROSTATE WITH MORCELLATION;  Surgeon: Penne Knee, MD;  Location: [facility] ORS;  Service: Urology;  Laterality: N/A;   INGUINAL HERNIA REPAIR Left 2012   INSERT / REPLACE / REMOVE PACEMAKER  06/19/2016   LAPAROSCOPIC  ABLATION RENAL MASS     LEFT HEART CATH AND CORONARY ANGIOGRAPHY Left 10/23/2017   Procedure: LEFT HEART CATH AND CORONARY ANGIOGRAPHY;  Surgeon: Darron Deatrice LABOR, MD;  Location: [facility] INVASIVE CV LAB;  Service: Cardiovascular;  Laterality: Left;   LEFT HEART CATH AND CORONARY ANGIOGRAPHY Left 02/26/2021   Procedure: LEFT HEART CATH AND CORONARY ANGIOGRAPHY;  Surgeon: Darron Deatrice LABOR, MD;  Location: [facility] INVASIVE CV LAB;  Service: Cardiovascular;  Laterality: Left;   LEFT HEART CATH AND CORONARY ANGIOGRAPHY Left 11/10/2023   Procedure: LEFT HEART CATH AND CORONARY ANGIOGRAPHY;  Surgeon: Darron Deatrice LABOR, MD;  Location: [facility] INVASIVE CV LAB;  Service: Cardiovascular;  Laterality: Left;   PACEMAKER IMPLANT N/A 06/19/2016   Procedure: Pacemaker Implant;  Surgeon: Elspeth BROCKS Fernande, MD;  Location: [facility] INVASIVE CV LAB;  Service: Cardiovascular;  Laterality: N/A;   pacemasker     PROSTATE SURGERY     RIGHT/LEFT HEART CATH AND CORONARY ANGIOGRAPHY Bilateral 11/25/2022   Procedure: RIGHT/LEFT HEART CATH AND CORONARY ANGIOGRAPHY;  Surgeon: Darron Deatrice LABOR, MD;  Location: [facility] INVASIVE CV LAB;  Service: Cardiovascular;  Laterality: Bilateral;   SQUAMOUS CELL CARCINOMA EXCISION     face, nose left shoulder, left arm   TONSILLECTOMY AND ADENOIDECTOMY     Patient Active Problem List   Diagnosis Date Noted   Angina pectoris (HCC) 11/10/2023   Weakness of left foot 10/29/2023   CKD stage 3b, GFR 30-44 ml/min (HCC) 10/29/2023   Obstructive sleep apnea 10/29/2023   Combined systolic and diastolic congestive heart failure (HCC) 10/29/2023   AKI (acute kidney injury) (HCC) 04/29/2023   UTI (urinary tract infection) 04/29/2023   Chronic systolic heart failure (HCC) 11/25/2022   Sick sinus syndrome (HCC) 02/26/2022   Closed fracture of second lumbar vertebra (HCC) 09/26/2021   Dysphagia, unspecified 09/26/2021   Heart block 09/26/2021   Heartburn 09/26/2021   History of lumbar fusion 09/26/2021    Inflammation of sacroiliac joint (HCC) 09/26/2021   Other symptoms and signs involving cognitive functions and awareness 09/26/2021   Prediabetes 09/26/2021   Low back pain 09/26/2021   Renal cell carcinoma (HCC) 09/26/2021   Essential hypertension 09/26/2021   Cerebrovascular disease 09/26/2021   Transient cerebral ischemic attack, unspecified 09/26/2021   Cerebrovascular disease, unspecified 09/26/2021   Transient ischemic attack 09/26/2021   Unstable angina (HCC)    Lumbar burst fracture (HCC) 05/04/2020   Hypogonadism male 10/10/2019   Persistent atrial fibrillation (HCC) 01/12/2019   Acute low back pain 11/04/2017   Abnormal screening cardiac  CT    Visit for monitoring Tikosyn  therapy 09/09/2017   History of TIA (transient ischemic attack) 08/29/2016   Paroxysmal atrial fibrillation (HCC) 07/23/2016   CAD (coronary artery disease) 07/23/2016   Snores 06/27/2016   S/P placement of cardiac pacemaker 06/25/2016   Hemiparesis (HCC) 06/25/2016   Acute ischemic stroke (HCC)    Left-sided weakness    Hemisensory loss    Dysarthria    Stroke-like symptoms    Complete heart block (HCC)    Pain in thoracic spine    Orthostasis    Lethargy    Vertebral artery occlusion, left    TIA (transient ischemic attack) 06/14/2016   Arthritis 02/06/2015   Esophageal reflux 02/06/2015   Arthritis urica 02/06/2015   Cannot sleep 02/06/2015   Arthritis, degenerative 02/06/2015   Adenocarcinoma, renal cell (HCC) 02/06/2015   Benign prostatic hyperplasia with lower urinary tract symptoms 11/21/2014   Personal history of other malignant neoplasm of kidney 11/21/2014   History of Lyme disease 05/06/2014   Palpitations 12/31/2013   Hyperlipidemia 11/02/2010   HYPERTENSION, BENIGN 04/16/2010    ONSET DATE: 05/06/2016   REFERRING DIAG:  R20.0 (ICD-10-CM) - Anesthesia of skin  R29.90 (ICD-10-CM) - Unspecified symptoms and signs involving the nervous system  G45.9 (ICD-10-CM) - Transient  cerebral ischemic attack, unspecified    THERAPY DIAG:  No diagnosis found.  Rationale for Evaluation and Treatment: Rehabilitation  SUBJECTIVE:                                                                                                                                                                                             SUBJECTIVE STATEMENT:  TODAY:   Patient reports back continued back pain rated 3/10 today in the mid-upper tspine. Believes that he may have a a Hairline fx in Tspine. Acknowledges that he should have orthopedic consult if pain continues.   Pt reports that he he has had a busy week with multiple doctor's appointments and meetings. States he is hoping to have a few days where he can rest without  constantly having to run around.     From EVAL: Pt accompanied by spouse who was a Interior and spatial designer of women's care at the hospital.  Pt reported he had a stroke in 2018 and a stent placed in June of 2025.  Pt notes that after the stint being placed, he felt numbness on the L side of the body.  Pt reports some weakness in the L LE, but more of the symptoms were found to be in the UE.    Pt had a prostate surgery back in 2022, and was seeing Shin-Yiing  for issues in the sacroilliac region.    Pt accompanied by: significant other  PERTINENT HISTORY: Pt has prior history of pacemaker, heart block, stints, and stroke.    PAIN:  Are you having pain? No  PRECAUTIONS: Fall and ICD/Pacemaker  RED FLAGS: Cauda equina syndrome: Yes: pt reporting some incontinence issues and numbness and tingling in the genital region.     WEIGHT BEARING RESTRICTIONS: No  FALLS: Has patient fallen in last 6 months? Yes. Number of falls 2 falls in April.  Pt fell and broke ribs back in April, but has had more near miss falls lately due to the L LE giving out.    LIVING ENVIRONMENT: Lives with: lives with their spouse Lives in: House/apartment Stairs: Yes: External: 2 steps; none Has  following equipment at home: None  PLOF: Independent  PATIENT GOALS: To have a normal walking gait, and reduce pain in the low back.  Increase flexibility.  OBJECTIVE:  Note: Objective measures were completed at Evaluation unless otherwise noted.  DIAGNOSTIC FINDINGS:    EXAM: CT HEAD WITHOUT CONTRAST  IMPRESSION: 1.  No evidence of an acute intracranial abnormality. 2. Parenchymal atrophy and chronic small vessel ischemic disease.   COGNITION: Overall cognitive status: Within functional limits for tasks assessed   SENSATION: WFL  COORDINATION: WFL   POSTURE: rounded shoulders, forward head, decreased lumbar lordosis, and increased thoracic kyphosis  LOWER EXTREMITY ROM:     Active  Right Eval Left Eval  Hip flexion    Hip extension    Hip abduction    Hip adduction    Hip internal rotation    Hip external rotation    Knee flexion    Knee extension    Ankle dorsiflexion    Ankle plantarflexion    Ankle inversion    Ankle eversion     (Blank rows = not tested)  LOWER EXTREMITY MMT:    MMT Right Eval Left Eval  Hip flexion 5 4  Hip extension    Hip abduction 5 4-  Hip adduction 5 4-  Hip internal rotation    Hip external rotation    Knee flexion 4+ 4-  Knee extension 4 4-  Ankle dorsiflexion 5 5  Ankle plantarflexion    Ankle inversion    Ankle eversion    (Blank rows = not tested)  BED MOBILITY:  Not tested  STAIRS: Not tested  FUNCTIONAL TESTS:  5 times sit to stand: 8.80 sec  Timed up and go (TUG): 8.61  miniBest Test: TBD  Single LE Balance Test: TBD  Dual-task Goal: TBD  Functional gait assessment: TBD  PATIENT SURVEYS:  ABC scale: The Activities-Specific Balance Confidence (ABC) Scale 0% 10 20 30  40 50 60 70 80 90 100% No confidence<->completely confident  "How confident are you that you will not lose your balance or become unsteady when you . . .   Date tested 12/15/23   Walk around the house 90%  2. Walk up or  down stairs 90%  3. Bend over and pick up a slipper from in front of a closet floor 90%  4. Reach for a small can off a shelf at eye level 100%  5. Stand on tip toes and reach for something above your head 100%  6. Stand on a chair and reach for something 80%  7. Sweep the floor 90%  8. Walk outside the house to a car parked in the driveway 90%  9. Get into or out of a car  80%  10. Walk across a parking lot to the mall 90%  11. Walk up or down a ramp 80%  12. Walk in a crowded mall where people rapidly walk past you 90%  13. Are bumped into by people as you walk through the mall 80%  14. Step onto or off of an escalator while you are holding onto the railing 100%  15. Step onto or off an escalator while holding onto parcels such that you cannot hold onto the railing 80%  16. Walk outside on icy sidewalks 90%  Total: 16/16  88.75%                                                                                                                                 TREATMENT DATE: 02/04/24  TA- To improve functional movements patterns for everyday tasks   Nustep with moist heat on T spine x 8 min level 4 for CV fitness, reciprocal movement training and pain reduction in T spine area of discomfort   Ambulatory transfer to chair with supervision assist, initially noted to have mild LOB to the L, but able to self correct without assist from PT, but then noted to have near fall when turning to sit in [facility] missing chair and requiring mod assist to prevent fall off arm rest.    Self care:  PT assessed pt's VS  Seated: 157/77 (99) HR 69  Standing 1 min: 156/137(145)  HR 75. ( Was talking during measurement ) asymptomatic Standing 2 min: 156/72(96) HR 66. as Sitting: 152/67(92) HR 63   NMR Single leg Standing with RUE support 3 x 15. And once without UE support. PT positioned pt in neutral position and pt reports that he felt that he was falling to the R.   On airex pad  Feet apart 2 x 30 sec and  eyes closed x 10 sec  Feet together eyes open/eyes closed 3 x 10 sec. Pt noted to have L bias throughout. PT positioned pt in neutral with eyes closed and pt reports that he feel like he is falling right.  Educated pt on possible vestibular or cerebral involvement to skew midline orientation with hx of Lime's disease and need to either expand BOS to reduce L sided bias and reduce fall risk or need to favor right side to reduce LOB to the R for improved habituation.   PATIENT EDUCATION: Education details: Pt educated on role of PT and services provided during current POC, along with prognosis and information about the clinic. Person educated: Patient and Spouse Education method: Explanation Education comprehension: verbalized understanding  HOME EXERCISE PROGRAM: Access Code: [email or web address] URL: [email or web address] Date: 12/17/2023 Prepared by: Reyes London  Exercises - Sidelying Hip Abduction with Resistance at Ankle  - 3 x weekly - 3 sets - 10 reps - Clamshell with Resistance  - 3 x weekly - 3 sets - 10 reps - Tandem Walking with Counter Support  -  3 x weekly - 3 sets - 10 reps    Access Code: [email or web address] URL: [email or web address] Date: 12/15/2023 Prepared by: Sidra Simpers  Program Notes **Be sure to perform all exercises next to a countertop or sturdy piece of furniture in case you become unsteady.  Exercises - Standing Heel Raise with Support  - 1 x daily - 7 x weekly - 2 sets - 15 reps - Standing Toe Raises at Chair  - 1 x daily - 7 x weekly - 2 sets - 15 reps - Mini Squat with Counter Support  - 1 x daily - 7 x weekly - 2 sets - 15 reps - Lunge with Counter Support  - 1 x daily - 7 x weekly - 2 sets - 15 reps - Standing Tandem Balance with Counter Support  - 1 x daily - 7 x weekly - 2 sets - 2 reps - 30 hold - Standing Single Leg Stance with Counter Support  - 1 x daily - 7 x weekly - 2 sets - 2 reps - 30 hold  GOALS: Goals reviewed with  patient? Yes  SHORT TERM GOALS: Target date: 01/12/2024  Pt will be independent with HEP in order to demonstrate increased ability to perform tasks related to occupation/hobbies. Baseline: Goal status: INITIAL   LONG TERM GOALS: Target date: 03/08/2024  Pt will improve ABC by at least 8% in order to demonstrate improvement in balance confidence. Baseline: 88.75% Goal status: INITIAL   2.  Patient will increase Berg Balance score by > 6 points to demonstrate decreased fall risk during functional activities. Baseline: 12/17/2023= 48/56 Goal status: INITIAL  3.  Patient will increase FGA score by > 2 points to demonstrate decreased fall risk during functional activities. Baseline: 12/17/2023= 27/30 Goal status: Revised  4.  Patient will increase six minute walk test distance to >1400 for progression to community ambulator and improve gait ability Baseline: TBD Goal status: INITIAL    ASSESSMENT:  CLINICAL IMPRESSION:  Patient is a 89 y.o. male who was seen today for physical therapy  treatment for imbalance. Noted to continue to have back pain and believes that pain is associated with vertebral fx, but has not been seen by orthopedic MD yet. Was noted to have near fall on this day requiring mod assist from PT to prevent fall to floor and was able to direct into chair. Education for midline disorientation as pt appears to have mild L side bias with eyes closed and feels that he is falling to the R when standing in neutral position. Pt will continue to benefit from skilled physical therapy intervention to address impairments, improve QOL, and attain therapy goals.   OBJECTIVE IMPAIRMENTS: Abnormal gait, decreased activity tolerance, decreased balance, decreased coordination, decreased endurance, decreased mobility, difficulty walking, decreased strength, decreased safety awareness, and impaired sensation.   ACTIVITY LIMITATIONS: carrying, lifting, bending, and stairs  PARTICIPATION  LIMITATIONS: cleaning, community activity, and yard work  PERSONAL FACTORS: Age, Past/current experiences, and 3+ comorbidities: TIA, pacemaker, low back pain, thoracic back pain, L sided weakness are also affecting patient's functional outcome.   REHAB POTENTIAL: Good  CLINICAL DECISION MAKING: Stable/uncomplicated  EVALUATION COMPLEXITY: Low  PLAN:  PT FREQUENCY: 1-2x/week  PT DURATION: 12 weeks  PLANNED INTERVENTIONS: 97750- Physical Performance Testing, 97110-Therapeutic exercises, 97530- Therapeutic activity, [id number]- Neuromuscular re-education, 97535- Self Care, [id number]- Manual therapy, [phone]- Gait training, Joint mobilization, Spinal mobilization, and Vestibular training  PLAN FOR NEXT SESSION:  High level dynamic balance activities.  dual task balance activities  Progressive LE strengthening.   Massie FORBES Dollar PT  Physical Therapist- [facility]   11:29 AM 02/04/24

## 2024-02-05 NOTE — Progress Notes (Signed)
 Remote PPM Transmission

## 2024-02-09 ENCOUNTER — Ambulatory Visit

## 2024-02-09 ENCOUNTER — Ambulatory Visit: Admitting: Physical Therapy

## 2024-02-09 LAB — CUP PACEART REMOTE DEVICE CHECK
Battery Remaining Longevity: 99 mo
Battery Voltage: 2.97 V
Brady Statistic AP VP Percent: 59.12 %
Brady Statistic AP VS Percent: 0.02 %
Brady Statistic AS VP Percent: 43.09 %
Brady Statistic AS VS Percent: 0.01 %
Brady Statistic RA Percent Paced: 27.65 %
Brady Statistic RV Percent Paced: 99.94 %
Date Time Interrogation Session: 20251004021144
Implantable Lead Connection Status: 753985
Implantable Lead Connection Status: 753985
Implantable Lead Connection Status: 753985
Implantable Lead Implant Date: 20180214
Implantable Lead Implant Date: 20180214
Implantable Lead Implant Date: 20240906
Implantable Lead Location: 753859
Implantable Lead Location: 753860
Implantable Lead Location: 753860
Implantable Lead Model: 3830
Implantable Lead Model: 5076
Implantable Lead Model: 5076
Implantable Pulse Generator Implant Date: 20240906
Lead Channel Impedance Value: 247 Ohm
Lead Channel Impedance Value: 304 Ohm
Lead Channel Impedance Value: 304 Ohm
Lead Channel Impedance Value: 342 Ohm
Lead Channel Impedance Value: 380 Ohm
Lead Channel Impedance Value: 380 Ohm
Lead Channel Impedance Value: 380 Ohm
Lead Channel Impedance Value: 494 Ohm
Lead Channel Impedance Value: 532 Ohm
Lead Channel Pacing Threshold Amplitude: 0.5 V
Lead Channel Pacing Threshold Amplitude: 1 V
Lead Channel Pacing Threshold Amplitude: 1.25 V
Lead Channel Pacing Threshold Pulse Width: 0.4 ms
Lead Channel Pacing Threshold Pulse Width: 0.4 ms
Lead Channel Pacing Threshold Pulse Width: 0.4 ms
Lead Channel Sensing Intrinsic Amplitude: 2.625 mV
Lead Channel Sensing Intrinsic Amplitude: 2.625 mV
Lead Channel Sensing Intrinsic Amplitude: 8.5 mV
Lead Channel Sensing Intrinsic Amplitude: 8.5 mV
Lead Channel Setting Pacing Amplitude: 1.5 V
Lead Channel Setting Pacing Amplitude: 1.5 V
Lead Channel Setting Pacing Amplitude: 2.5 V
Lead Channel Setting Pacing Pulse Width: 0.4 ms
Lead Channel Setting Pacing Pulse Width: 0.4 ms
Lead Channel Setting Sensing Sensitivity: 4 mV
Zone Setting Status: 755011

## 2024-02-10 NOTE — Progress Notes (Signed)
 Remote PPM Transmission

## 2024-02-11 ENCOUNTER — Ambulatory Visit: Admitting: Physical Therapy

## 2024-02-11 ENCOUNTER — Encounter

## 2024-02-16 ENCOUNTER — Encounter

## 2024-02-16 ENCOUNTER — Ambulatory Visit

## 2024-02-18 ENCOUNTER — Ambulatory Visit

## 2024-02-18 DIAGNOSIS — R2689 Other abnormalities of gait and mobility: Secondary | ICD-10-CM

## 2024-02-18 DIAGNOSIS — M6281 Muscle weakness (generalized): Secondary | ICD-10-CM

## 2024-02-18 DIAGNOSIS — R293 Abnormal posture: Secondary | ICD-10-CM

## 2024-02-18 DIAGNOSIS — R278 Other lack of coordination: Secondary | ICD-10-CM

## 2024-02-18 NOTE — Therapy (Signed)
 OUTPATIENT PHYSICAL THERAPY NEURO TREATMENT/Physical Therapy Progress Note   Dates of reporting period  12/15/2023   to   02/18/2024   Patient Name: Thomas [name] MRN: [id number] DOB:1932/06/03, 89 y.o., male Today's Date: 12/15/2023   PCP: [name], MD  REFERRING PROVIDER: Darron Deatrice LABOR, MD    PT End of Session - 02/18/24 1112     Visit Number 10    Number of Visits 25    Date for Recertification  03/08/24    Progress Note Due on Visit 20    PT Start Time 1108    Equipment Utilized During Treatment Gait belt    Activity Tolerance Patient tolerated treatment well    Behavior During Therapy [facility] for tasks assessed/performed               Past Medical History:  Diagnosis Date   Aortic insufficiency    a. 07/2023 Echo: Mod AI; b. 09/2023 Echo: Mild-mod AI.   Aortic valve disorders    Arthritis    Basal cell carcinoma    face, nose left shoulder, left arm (06/19/2016)   Basal cell carcinoma 09/13/2020   right temple   BBB (bundle branch block)    hx right   CAD (coronary artery disease)    a. 10/2017 Cath: mod, nonobs dzs; b. 02/2021 Cath: mod, nonobs dzs; c. 11/2022 Cath: LAD 30ost/p, 65m, D1 min irregs, LCX 65p/m, OM1 min irregs, OM2 40, OM3 nl, RCA 30p, 32m, 40d w/ 40 in side branch-->Med rx.   Chronic back pain    neck, thoracic, lower back (06/19/2016)   CKD (chronic kidney disease), stage III (HCC)    Complete heart block (HCC) 06/2016   a. 06/2016 s/p MDT PPM; b. 01/2023 s/p upgrade to MDT CRT-P (ser # [id number]).   GERD (gastroesophageal reflux disease)    Gout    Heart block    I've had type I, II Wenke before now (06/19/2016)   History of gout    History of hiatal hernia    self dx'd (06/19/2016)   Hyperlipidemia    Hypertension    Lyme disease    dx'd by me 2003; cx's showed dx 08/2015   Migraine    3-4/year (06/19/2016)   Mitral regurgitation    a. 07/2023 Echo: Mod MR.   NICM (nonischemic cardiomyopathy - in setting of RV  pacing) (HCC)    a. 02/2019 Echo: EF 45-50%; b. 10/2022 Echo: EF 30-35%; c. 01/2023 CRT-P upgrade; e. 07/2023 Echo: EF 40-45%, glob HK; f. 10/2023 Echo: EF of 55 to 60% with grade 1 DD, nl RV fxn, triv MR, mild to mod AI, and mildly dilated ascending aorta at 42 mm.   PAF (paroxysmal atrial fibrillation) (HCC)    a. CHA2DS2VASc = 7--> eliquis ; b. On tikosyn .   Paroxysmal atrial flutter (HCC)    Presence of permanent cardiac pacemaker    PVC's (premature ventricular contractions)    Renal cancer, left (HCC) 2006   S/P cryotherapy   Spinal stenosis    cervical, 1 thoracic, lumbar (06/19/2016)   Squamous carcinoma    face, nose left shoulder, left arm (06/19/2016)   Stroke (HCC)    TIA (transient ischemic attack) 06/14/2016   I'm not sure that's what it was (06/25/2016)   Visit for monitoring Tikosyn  therapy 09/09/2017   Past Surgical History:  Procedure Laterality Date   ANKLE FRACTURE SURGERY Right 1967   BACK SURGERY  05/07/2020   BASAL CELL CARCINOMA EXCISION  face, nose left shoulder, left arm   BIOPSY PROSTATE  2001 & 2003   BIV UPGRADE N/A 01/10/2023   Procedure: BIV UPGRADE;  Surgeon: Fernande Elspeth BROCKS, MD;  Location: [facility] INVASIVE CV LAB;  Service: Cardiovascular;  Laterality: N/A;   CARDIAC CATHETERIZATION  1990's   CARDIOVERSION N/A 09/11/2017   Procedure: CARDIOVERSION;  Surgeon: Pietro Redell RAMAN, MD;  Location: [facility] ENDOSCOPY;  Service: Cardiovascular;  Laterality: N/A;   CORONARY STENT INTERVENTION N/A 11/10/2023   Procedure: CORONARY STENT INTERVENTION;  Surgeon: Darron Deatrice LABOR, MD;  Location: [facility] INVASIVE CV LAB;  Service: Cardiovascular;  Laterality: N/A;   FRACTURE SURGERY     HOLEP-LASER ENUCLEATION OF THE PROSTATE WITH MORCELLATION N/A 07/10/2020   Procedure: HOLEP-LASER ENUCLEATION OF THE PROSTATE WITH MORCELLATION;  Surgeon: Penne Knee, MD;  Location: [facility] ORS;  Service: Urology;  Laterality: N/A;   INGUINAL HERNIA REPAIR Left 2012   INSERT / REPLACE / REMOVE  PACEMAKER  06/19/2016   LAPAROSCOPIC ABLATION RENAL MASS     LEFT HEART CATH AND CORONARY ANGIOGRAPHY Left 10/23/2017   Procedure: LEFT HEART CATH AND CORONARY ANGIOGRAPHY;  Surgeon: Darron Deatrice LABOR, MD;  Location: [facility] INVASIVE CV LAB;  Service: Cardiovascular;  Laterality: Left;   LEFT HEART CATH AND CORONARY ANGIOGRAPHY Left 02/26/2021   Procedure: LEFT HEART CATH AND CORONARY ANGIOGRAPHY;  Surgeon: Darron Deatrice LABOR, MD;  Location: [facility] INVASIVE CV LAB;  Service: Cardiovascular;  Laterality: Left;   LEFT HEART CATH AND CORONARY ANGIOGRAPHY Left 11/10/2023   Procedure: LEFT HEART CATH AND CORONARY ANGIOGRAPHY;  Surgeon: Darron Deatrice LABOR, MD;  Location: [facility] INVASIVE CV LAB;  Service: Cardiovascular;  Laterality: Left;   PACEMAKER IMPLANT N/A 06/19/2016   Procedure: Pacemaker Implant;  Surgeon: Elspeth BROCKS Fernande, MD;  Location: [facility] INVASIVE CV LAB;  Service: Cardiovascular;  Laterality: N/A;   pacemasker     PROSTATE SURGERY     RIGHT/LEFT HEART CATH AND CORONARY ANGIOGRAPHY Bilateral 11/25/2022   Procedure: RIGHT/LEFT HEART CATH AND CORONARY ANGIOGRAPHY;  Surgeon: Darron Deatrice LABOR, MD;  Location: [facility] INVASIVE CV LAB;  Service: Cardiovascular;  Laterality: Bilateral;   SQUAMOUS CELL CARCINOMA EXCISION     face, nose left shoulder, left arm   TONSILLECTOMY AND ADENOIDECTOMY     Patient Active Problem List   Diagnosis Date Noted   Angina pectoris (HCC) 11/10/2023   Weakness of left foot 10/29/2023   CKD stage 3b, GFR 30-44 ml/min (HCC) 10/29/2023   Obstructive sleep apnea 10/29/2023   Combined systolic and diastolic congestive heart failure (HCC) 10/29/2023   AKI (acute kidney injury) (HCC) 04/29/2023   UTI (urinary tract infection) 04/29/2023   Chronic systolic heart failure (HCC) 11/25/2022   Sick sinus syndrome (HCC) 02/26/2022   Closed fracture of second lumbar vertebra (HCC) 09/26/2021   Dysphagia, unspecified 09/26/2021   Heart block 09/26/2021   Heartburn 09/26/2021   History of  lumbar fusion 09/26/2021   Inflammation of sacroiliac joint (HCC) 09/26/2021   Other symptoms and signs involving cognitive functions and awareness 09/26/2021   Prediabetes 09/26/2021   Low back pain 09/26/2021   Renal cell carcinoma (HCC) 09/26/2021   Essential hypertension 09/26/2021   Cerebrovascular disease 09/26/2021   Transient cerebral ischemic attack, unspecified 09/26/2021   Cerebrovascular disease, unspecified 09/26/2021   Transient ischemic attack 09/26/2021   Unstable angina (HCC)    Lumbar burst fracture (HCC) 05/04/2020   Hypogonadism male 10/10/2019   Persistent atrial fibrillation (HCC) 01/12/2019   Acute low back pain 11/04/2017   Abnormal screening  cardiac CT    Visit for monitoring Tikosyn  therapy 09/09/2017   History of TIA (transient ischemic attack) 08/29/2016   Paroxysmal atrial fibrillation (HCC) 07/23/2016   CAD (coronary artery disease) 07/23/2016   Snores 06/27/2016   S/P placement of cardiac pacemaker 06/25/2016   Hemiparesis (HCC) 06/25/2016   Acute ischemic stroke (HCC)    Left-sided weakness    Hemisensory loss    Dysarthria    Stroke-like symptoms    Complete heart block (HCC)    Pain in thoracic spine    Orthostasis    Lethargy    Vertebral artery occlusion, left    TIA (transient ischemic attack) 06/14/2016   Arthritis 02/06/2015   Esophageal reflux 02/06/2015   Arthritis urica 02/06/2015   Cannot sleep 02/06/2015   Arthritis, degenerative 02/06/2015   Adenocarcinoma, renal cell (HCC) 02/06/2015   Benign prostatic hyperplasia with lower urinary tract symptoms 11/21/2014   Personal history of other malignant neoplasm of kidney 11/21/2014   History of Lyme disease 05/06/2014   Palpitations 12/31/2013   Hyperlipidemia 11/02/2010   HYPERTENSION, BENIGN 04/16/2010    ONSET DATE: 05/06/2016   REFERRING DIAG:  R20.0 (ICD-10-CM) - Anesthesia of skin  R29.90 (ICD-10-CM) - Unspecified symptoms and signs involving the nervous system  G45.9  (ICD-10-CM) - Transient cerebral ischemic attack, unspecified    THERAPY DIAG:  No diagnosis found.  Rationale for Evaluation and Treatment: Rehabilitation  SUBJECTIVE:                                                                                                                                                                                             SUBJECTIVE STATEMENT:  TODAY:   Patient reports back continued back pain rated 3/10 today in the mid-upper tspine. Believes that he may have a a Hairline fx in Tspine. Acknowledges that he should have orthopedic consult if pain continues.   Pt reports that he he has had a busy week with multiple doctor's appointments and meetings. States he is hoping to have a few days where he can rest without  constantly having to run around.     From EVAL: Pt accompanied by spouse who was a Interior and spatial designer of women's care at the hospital.  Pt reported he had a stroke in 2018 and a stent placed in June of 2025.  Pt notes that after the stint being placed, he felt numbness on the L side of the body.  Pt reports some weakness in the L LE, but more of the symptoms were found to be in the UE.    Pt had a prostate surgery back in 2022, and was seeing  Shin-Yiing for issues in the sacroilliac region.    Pt accompanied by: significant other  PERTINENT HISTORY: Pt has prior history of pacemaker, heart block, stints, and stroke.    PAIN:  Are you having pain? No  PRECAUTIONS: Fall and ICD/Pacemaker  RED FLAGS: Cauda equina syndrome: Yes: pt reporting some incontinence issues and numbness and tingling in the genital region.     WEIGHT BEARING RESTRICTIONS: No  FALLS: Has patient fallen in last 6 months? Yes. Number of falls 2 falls in April.  Pt fell and broke ribs back in April, but has had more near miss falls lately due to the L LE giving out.    LIVING ENVIRONMENT: Lives with: lives with their spouse Lives in: House/apartment Stairs: Yes: External: 2  steps; none Has following equipment at home: None  PLOF: Independent  PATIENT GOALS: To have a normal walking gait, and reduce pain in the low back.  Increase flexibility.  OBJECTIVE:  Note: Objective measures were completed at Evaluation unless otherwise noted.  DIAGNOSTIC FINDINGS:    EXAM: CT HEAD WITHOUT CONTRAST  IMPRESSION: 1.  No evidence of an acute intracranial abnormality. 2. Parenchymal atrophy and chronic small vessel ischemic disease.   COGNITION: Overall cognitive status: Within functional limits for tasks assessed   SENSATION: WFL  COORDINATION: WFL   POSTURE: rounded shoulders, forward head, decreased lumbar lordosis, and increased thoracic kyphosis  LOWER EXTREMITY ROM:     Active  Right Eval Left Eval  Hip flexion    Hip extension    Hip abduction    Hip adduction    Hip internal rotation    Hip external rotation    Knee flexion    Knee extension    Ankle dorsiflexion    Ankle plantarflexion    Ankle inversion    Ankle eversion     (Blank rows = not tested)  LOWER EXTREMITY MMT:    MMT Right Eval Left Eval  Hip flexion 5 4  Hip extension    Hip abduction 5 4-  Hip adduction 5 4-  Hip internal rotation    Hip external rotation    Knee flexion 4+ 4-  Knee extension 4 4-  Ankle dorsiflexion 5 5  Ankle plantarflexion    Ankle inversion    Ankle eversion    (Blank rows = not tested)  BED MOBILITY:  Not tested  STAIRS: Not tested  FUNCTIONAL TESTS:   5 times sit to stand: 8.80 sec  Timed up and go (TUG): 8.61  miniBest Test: TBD  Single LE Balance Test: TBD  Dual-task Goal: TBD  Functional gait assessment: TBD  PATIENT SURVEYS:  ABC scale: The Activities-Specific Balance Confidence (ABC) Scale 0% 10 20 30  40 50 60 70 80 90 100% No confidence<->completely confident  "How confident are you that you will not lose your balance or become unsteady when you . . .   Date tested 12/15/23  02/18/2024  Walk around the  house 90% 80  2. Walk up or down stairs 90% 80  3. Bend over and pick up a slipper from in front of a closet floor 90% 90  4. Reach for a small can off a shelf at eye level 100% 100  5. Stand on tip toes and reach for something above your head 100% 80  6. Stand on a chair and reach for something 80% 90  7. Sweep the floor 90% 100  8. Walk outside the house to a car parked in the driveway  90% 100  9. Get into or out of a car 80% 70  10. Walk across a parking lot to the mall 90% 100  11. Walk up or down a ramp 80% 100  12. Walk in a crowded mall where people rapidly walk past you 90% 100  13. Are bumped into by people as you walk through the mall 80% 90  14. Step onto or off of an escalator while you are holding onto the railing 100% 100  15. Step onto or off an escalator while holding onto parcels such that you cannot hold onto the railing 80% 80  16. Walk outside on icy sidewalks 90% 70  Total: 16/16  88.75% 89.38 %                                                                                                                                 TREATMENT DATE: 02/18/24  Physical therapy treatment session today consisted of completing assessment of goals and administration of testing as demonstrated and documented in flow sheet, treatment, and goals section of this note. Addition treatments may be found below.    [facility] PT Assessment - 02/18/24 0001       Berg Balance Test   Sit to Stand Able to stand without using hands and stabilize independently    Standing Unsupported Able to stand safely 2 minutes    Sitting with Back Unsupported but Feet Supported on Floor or Stool Able to sit safely and securely 2 minutes    Stand to Sit Sits safely with minimal use of hands    Transfers Able to transfer safely, minor use of hands    Standing Unsupported with Eyes Closed Able to stand 10 seconds safely    Standing Unsupported with Feet Together Able to place feet together independently and stand 1  minute safely    From Standing, Reach Forward with Outstretched Arm Can reach forward >12 cm safely (5)    From Standing Position, Pick up Object from Floor Able to pick up shoe safely and easily    From Standing Position, Turn to Look Behind Over each Shoulder Turn sideways only but maintains balance    Turn 360 Degrees Able to turn 360 degrees safely in 4 seconds or less    Standing Unsupported, Alternately Place Feet on Step/Stool Able to stand independently and safely and complete 8 steps in 20 seconds    Standing Unsupported, One Foot in Front Able to place foot tandem independently and hold 30 seconds    Standing on One Leg Able to lift leg independently and hold 5-10 seconds    Total Score 52      Functional Gait  Assessment   Gait assessed  Yes    Gait Level Surface Walks 20 ft in less than 5.5 sec, no assistive devices, good speed, no evidence for imbalance, normal gait pattern, deviates no more than 6 in outside of the 12 in  walkway width.    Change in Gait Speed Able to smoothly change walking speed without loss of balance or gait deviation. Deviate no more than 6 in outside of the 12 in walkway width.    Gait with Horizontal Head Turns Performs head turns smoothly with slight change in gait velocity (eg, minor disruption to smooth gait path), deviates 6-10 in outside 12 in walkway width, or uses an assistive device.    Gait with Vertical Head Turns Performs task with slight change in gait velocity (eg, minor disruption to smooth gait path), deviates 6 - 10 in outside 12 in walkway width or uses assistive device    Gait and Pivot Turn Pivot turns safely within 3 sec and stops quickly with no loss of balance.    Step Over Obstacle Is able to step over 2 stacked shoe boxes taped together (9 in total height) without changing gait speed. No evidence of imbalance.    Gait with Narrow Base of Support Ambulates 7-9 steps.    Gait with Eyes Closed Walks 20 ft, no assistive devices, good speed,  no evidence of imbalance, normal gait pattern, deviates no more than 6 in outside 12 in walkway width. Ambulates 20 ft in less than 7 sec.    Ambulating Backwards Walks 20 ft, no assistive devices, good speed, no evidence for imbalance, normal gait    Steps Alternating feet, no rail.    Total Score 27          Self care/home management:  Reviewed current HEP with patient and patient presents with good working knowledge- been compromised by some back pain.  PATIENT EDUCATION: Education details: Pt educated on role of PT and services provided during current POC, along with prognosis and information about the clinic. Person educated: Patient and Spouse Education method: Explanation Education comprehension: verbalized understanding  HOME EXERCISE PROGRAM: Access Code: [email or web address] URL: [email or web address] Date: 12/17/2023 Prepared by: Reyes London  Exercises - Sidelying Hip Abduction with Resistance at Ankle  - 3 x weekly - 3 sets - 10 reps - Clamshell with Resistance  - 3 x weekly - 3 sets - 10 reps - Tandem Walking with Counter Support  - 3 x weekly - 3 sets - 10 reps    Access Code: [email or web address] URL: [email or web address] Date: 12/15/2023 Prepared by: Sidra Simpers  Program Notes **Be sure to perform all exercises next to a countertop or sturdy piece of furniture in case you become unsteady.  Exercises - Standing Heel Raise with Support  - 1 x daily - 7 x weekly - 2 sets - 15 reps - Standing Toe Raises at Chair  - 1 x daily - 7 x weekly - 2 sets - 15 reps - Mini Squat with Counter Support  - 1 x daily - 7 x weekly - 2 sets - 15 reps - Lunge with Counter Support  - 1 x daily - 7 x weekly - 2 sets - 15 reps - Standing Tandem Balance with Counter Support  - 1 x daily - 7 x weekly - 2 sets - 2 reps - 30 hold - Standing Single Leg Stance with Counter Support  - 1 x daily - 7 x weekly - 2 sets - 2 reps - 30 hold  GOALS: Goals reviewed with patient?  Yes  SHORT TERM GOALS: Target date: 01/12/2024  Pt will be independent with HEP in order to demonstrate increased ability to perform tasks related to occupation/hobbies. Baseline: Patient reports doing well with all activities and no  questions at this time. Goal status: MET   LONG TERM GOALS: Target date: 03/08/2024  Pt will improve ABC by at least 8% in order to demonstrate improvement in balance confidence. Baseline: 88.75%; 02/18/2024= 89.38% Goal status: PROGRESSING   2.  Patient will increase Berg Balance score by > 6 points to demonstrate decreased fall risk during functional activities. Baseline: 12/17/2023= 48/56; 02/18/2024= 52/56 Goal status: PROGRESSING  3.  Patient will increase FGA score by > 2 points to demonstrate decreased fall risk during functional activities. Baseline: 12/17/2023= 27/30; 02/18/2024= 27/30 Goal status: ONGOING  4.  Patient will increase six minute walk test distance to >1400 for progression to community ambulator and improve gait ability Baseline: TBD; 01/15/2024= 1281 feet;  Goal status: INITIAL    ASSESSMENT:  CLINICAL IMPRESSION:  Patient is a 89 y.o. male who was seen today for physical therapy  treatment for imbalance. Patient presents with some improvement in balance testing- scoring 4 points more on BERG today. FGA remained unchanged in total score and patient denies any falls. He presents with slight improvement in self confidence in balance as seen by improved ABC score.  Patient's condition has the potential to improve in response to therapy. Maximum improvement is yet to be obtained. The anticipated improvement is attainable and reasonable in a generally predictable time.   Pt will continue to benefit from skilled physical therapy intervention to address impairments, improve QOL, and attain therapy goals.   OBJECTIVE IMPAIRMENTS: Abnormal gait, decreased activity tolerance, decreased balance, decreased coordination, decreased endurance,  decreased mobility, difficulty walking, decreased strength, decreased safety awareness, and impaired sensation.   ACTIVITY LIMITATIONS: carrying, lifting, bending, and stairs  PARTICIPATION LIMITATIONS: cleaning, community activity, and yard work  PERSONAL FACTORS: Age, Past/current experiences, and 3+ comorbidities: TIA, pacemaker, low back pain, thoracic back pain, L sided weakness are also affecting patient's functional outcome.   REHAB POTENTIAL: Good  CLINICAL DECISION MAKING: Stable/uncomplicated  EVALUATION COMPLEXITY: Low  PLAN:  PT FREQUENCY: 1-2x/week  PT DURATION: 12 weeks  PLANNED INTERVENTIONS: 97750- Physical Performance Testing, 97110-Therapeutic exercises, 97530- Therapeutic activity, [id number]- Neuromuscular re-education, 97535- Self Care, [id number]- Manual therapy, [phone]- Gait training, Joint mobilization, Spinal mobilization, and Vestibular training  PLAN FOR NEXT SESSION:  6 min walk test- goal assessment High level dynamic balance activities.  dual task balance activities Progressive LE strengthening.   Reyes LOISE London PT  Physical Therapist- [facility]   12:23 PM 02/18/24

## 2024-02-18 NOTE — Therapy (Signed)
 OUTPATIENT OCCUPATIONAL THERAPY NEURO TREATMENT NOTE  Patient Name: Thomas [name] MRN: [id number] DOB:11/26/1932, 89 y.o., male Today's Date: 02/20/2024  PCP: Charlie Forte REFERRING PROVIDER: [name]  END OF SESSION:  OT End of Session - 02/20/24 1709     Visit Number 6    Number of Visits 24    Date for Recertification  03/08/24    OT Start Time 1145    OT Stop Time 1230    OT Time Calculation (min) 45 min    Activity Tolerance Patient tolerated treatment well    Behavior During Therapy [facility] for tasks assessed/performed          Past Medical History:  Diagnosis Date   Aortic insufficiency    a. 07/2023 Echo: Mod AI; b. 09/2023 Echo: Mild-mod AI.   Aortic valve disorders    Arthritis    Basal cell carcinoma    face, nose left shoulder, left arm (06/19/2016)   Basal cell carcinoma 09/13/2020   right temple   BBB (bundle branch block)    hx right   CAD (coronary artery disease)    a. 10/2017 Cath: mod, nonobs dzs; b. 02/2021 Cath: mod, nonobs dzs; c. 11/2022 Cath: LAD 30ost/p, 27m, D1 min irregs, LCX 65p/m, OM1 min irregs, OM2 40, OM3 nl, RCA 30p, 16m, 40d w/ 40 in side branch-->Med rx; d. 11/2023 PCI: LAD 90ost/p (3.5x15 Onyx Frontier DES). Otw stable anatomy.   Chronic back pain    neck, thoracic, lower back (06/19/2016)   CKD (chronic kidney disease), stage III (HCC)    Complete heart block (HCC) 06/2016   a. 06/2016 s/p MDT PPM; b. 01/2023 s/p upgrade to MDT CRT-P (ser # [id number]).   GERD (gastroesophageal reflux disease)    Gout    Heart block    I've had type I, II Wenke before now (06/19/2016)   History of gout    History of hiatal hernia    self dx'd (06/19/2016)   Hyperlipidemia    Hypertension    Lyme disease    dx'd by me 2003; cx's showed dx 08/2015   Migraine    3-4/year (06/19/2016)   Mitral regurgitation    a. 07/2023 Echo: Mod MR.   NICM (nonischemic cardiomyopathy - in setting of RV pacing) (HCC)    a. 02/2019 Echo: EF 45-50%; b.  10/2022 Echo: EF 30-35%; c. 01/2023 CRT-P upgrade; e. 07/2023 Echo: EF 40-45%, glob HK; f. 10/2023 Echo: EF of 55 to 60% with grade 1 DD, nl RV fxn, triv MR, mild to mod AI, and mildly dilated ascending aorta at 42 mm.   PAF (paroxysmal atrial fibrillation) (HCC)    a. CHA2DS2VASc = 7--> eliquis ; b. On tikosyn .   Paroxysmal atrial flutter (HCC)    Presence of permanent cardiac pacemaker    PVC's (premature ventricular contractions)    Renal cancer, left (HCC) 2006   S/P cryotherapy   Spinal stenosis    cervical, 1 thoracic, lumbar (06/19/2016)   Squamous carcinoma    face, nose left shoulder, left arm (06/19/2016)   Stroke (HCC)    TIA (transient ischemic attack) 06/14/2016   I'm not sure that's what it was (06/25/2016)   Visit for monitoring Tikosyn  therapy 09/09/2017   Past Surgical History:  Procedure Laterality Date   ANKLE FRACTURE SURGERY Right 1967   BACK SURGERY  05/07/2020   BASAL CELL CARCINOMA EXCISION     face, nose left shoulder, left arm   BIOPSY PROSTATE  2001 & 2003  BIV UPGRADE N/A 01/10/2023   Procedure: BIV UPGRADE;  Surgeon: Fernande Elspeth BROCKS, MD;  Location: [facility] INVASIVE CV LAB;  Service: Cardiovascular;  Laterality: N/A;   CARDIAC CATHETERIZATION  1990's   CARDIOVERSION N/A 09/11/2017   Procedure: CARDIOVERSION;  Surgeon: Pietro Redell RAMAN, MD;  Location[facility] ENDOSCOPY;  Service: Cardiovascular;  Laterality: N/A;   CORONARY STENT INTERVENTION N/A 11/10/2023   Procedure: CORONARY STENT INTERVENTION;  Surgeon: Darron Deatrice LABOR, MD;  Location: [facility] INVASIVE CV LAB;  Service: Cardiovascular;  Laterality: N/A;   FRACTURE SURGERY     HOLEP-LASER ENUCLEATION OF THE PROSTATE WITH MORCELLATION N/A 07/10/2020   Procedure: HOLEP-LASER ENUCLEATION OF THE PROSTATE WITH MORCELLATION;  Surgeon: Penne Knee, MD;  Location: [facility] ORS;  Service: Urology;  Laterality: N/A;   INGUINAL HERNIA REPAIR Left 2012   INSERT / REPLACE / REMOVE PACEMAKER  06/19/2016   LAPAROSCOPIC ABLATION  RENAL MASS     LEFT HEART CATH AND CORONARY ANGIOGRAPHY Left 10/23/2017   Procedure: LEFT HEART CATH AND CORONARY ANGIOGRAPHY;  Surgeon: Darron Deatrice LABOR, MD;  Location: [facility] INVASIVE CV LAB;  Service: Cardiovascular;  Laterality: Left;   LEFT HEART CATH AND CORONARY ANGIOGRAPHY Left 02/26/2021   Procedure: LEFT HEART CATH AND CORONARY ANGIOGRAPHY;  Surgeon: Darron Deatrice LABOR, MD;  Location: [facility] INVASIVE CV LAB;  Service: Cardiovascular;  Laterality: Left;   LEFT HEART CATH AND CORONARY ANGIOGRAPHY Left 11/10/2023   Procedure: LEFT HEART CATH AND CORONARY ANGIOGRAPHY;  Surgeon: Darron Deatrice LABOR, MD;  Location: [facility] INVASIVE CV LAB;  Service: Cardiovascular;  Laterality: Left;   PACEMAKER IMPLANT N/A 06/19/2016   Procedure: Pacemaker Implant;  Surgeon: Elspeth BROCKS Fernande, MD;  Location: [facility] INVASIVE CV LAB;  Service: Cardiovascular;  Laterality: N/A;   pacemasker     PROSTATE SURGERY     RIGHT/LEFT HEART CATH AND CORONARY ANGIOGRAPHY Bilateral 11/25/2022   Procedure: RIGHT/LEFT HEART CATH AND CORONARY ANGIOGRAPHY;  Surgeon: Darron Deatrice LABOR, MD;  Location: [facility] INVASIVE CV LAB;  Service: Cardiovascular;  Laterality: Bilateral;   SQUAMOUS CELL CARCINOMA EXCISION     face, nose left shoulder, left arm   TONSILLECTOMY AND ADENOIDECTOMY     Patient Active Problem List   Diagnosis Date Noted   Angina pectoris 11/10/2023   Weakness of left foot 10/29/2023   CKD stage 3b, GFR 30-44 ml/min (HCC) 10/29/2023   Obstructive sleep apnea 10/29/2023   Combined systolic and diastolic congestive heart failure (HCC) 10/29/2023   AKI (acute kidney injury) 04/29/2023   UTI (urinary tract infection) 04/29/2023   Chronic systolic heart failure (HCC) 11/25/2022   Sick sinus syndrome (HCC) 02/26/2022   Closed fracture of second lumbar vertebra (HCC) 09/26/2021   Dysphagia, unspecified 09/26/2021   Heart block 09/26/2021   Heartburn 09/26/2021   History of lumbar fusion 09/26/2021   Inflammation of sacroiliac  joint 09/26/2021   Other symptoms and signs involving cognitive functions and awareness 09/26/2021   Prediabetes 09/26/2021   Low back pain 09/26/2021   Renal cell carcinoma (HCC) 09/26/2021   Essential hypertension 09/26/2021   Cerebrovascular disease 09/26/2021   Transient cerebral ischemic attack, unspecified 09/26/2021   Cerebrovascular disease, unspecified 09/26/2021   Transient ischemic attack 09/26/2021   Unstable angina (HCC)    Lumbar burst fracture (HCC) 05/04/2020   Hypogonadism male 10/10/2019   Persistent atrial fibrillation (HCC) 01/12/2019   Acute low back pain 11/04/2017   Abnormal screening cardiac CT    Visit for monitoring Tikosyn  therapy 09/09/2017   History of TIA (transient ischemic attack)  08/29/2016   Paroxysmal atrial fibrillation (HCC) 07/23/2016   CAD (coronary artery disease) 07/23/2016   Snores 06/27/2016   S/P placement of cardiac pacemaker 06/25/2016   Hemiparesis (HCC) 06/25/2016   Acute ischemic stroke (HCC)    Left-sided weakness    Hemisensory loss    Dysarthria    Stroke-like symptoms    Complete heart block (HCC)    Pain in thoracic spine    Orthostasis    Lethargy    Vertebral artery occlusion, left    TIA (transient ischemic attack) 06/14/2016   Arthritis 02/06/2015   Esophageal reflux 02/06/2015   Arthritis urica 02/06/2015   Cannot sleep 02/06/2015   Arthritis, degenerative 02/06/2015   Adenocarcinoma, renal cell (HCC) 02/06/2015   Benign prostatic hyperplasia with lower urinary tract symptoms 11/21/2014   Personal history of other malignant neoplasm of kidney 11/21/2014   History of Lyme disease 05/06/2014   Palpitations 12/31/2013   Hyperlipidemia 11/02/2010   HYPERTENSION, BENIGN 04/16/2010   ONSET DATE: 11/10/23  REFERRING DIAG: CVA  THERAPY DIAG:  Muscle weakness (generalized)  Other lack of coordination  Rationale for Evaluation and Treatment: Rehabilitation  SUBJECTIVE:  SUBJECTIVE STATEMENT: Pt reports his back  pain increased yesterday after his reclined chair at the dentist office sat him up abruptly.  Pt reports he plans to get his back checked out if the pain doesn't improve soon. Pt accompanied by: self and significant other  PERTINENT HISTORY: 89 year old male who was admitted to hospital on 11/10/23 for management of unstable angina after pt developed clumsiness of the left arm after cardiac catheterization 7/7, workup for TIA. Per neurology note symptoms secondary to relative cerebral hypoperfusion in the setting of heart disease and right carotid artery and M1 stenosis. PMhx: PAF on Eliquis , HTN, HLD, prediabetes, OSA on CPAP, dominant left vertebral artery occlusion, TIA, SSS s/p PPM, BPH, renal CA, lumbar fusion, A-fib.  PRECAUTIONS: None  WEIGHT BEARING RESTRICTIONS: No  PAIN: 02/18/24: 3/10 thoracic spine Are you having pain? 4/10  back pain  FALLS: Has patient fallen in last 6 months? Yes. Number of falls 4  LIVING ENVIRONMENT: Lives with: lives with their spouse Lives in: House/apartment Stairs: Yes: Internal: 2 steps; none Has following equipment at home: Single point cane and Walker - 2 wheeled  PLOF: Independent with basic ADLs  PATIENT GOALS: to go to [facility] for more detailed neuro testing and return to function in LUE  OBJECTIVE:  Note: Objective measures were completed at Evaluation unless otherwise noted.  HAND DOMINANCE: Right  ADLs: Overall ADLs: MIN A Transfers/ambulation related to ADLs: Eating: IND - uses dominant R Grooming: SETUP UB Dressing: SETUP - buttons assist LB Dressing: MIN - hiking pants  Toileting: IND Bathing: IND Tub Shower transfers: IND Equipment: none  IADLs: Shopping: spouse completes as needed Light housekeeping: spouse completes as needed Meal Prep: assist from spouse Community mobility: SPC/RW as needed Medication management: IND Landscape architect: IND Handwriting: 100% legible R hand  MOBILITY STATUS: Independent and Hx of  falls  POSTURE COMMENTS:  rounded shoulders and forward head Sitting balance: Sits without UE support up to 30 sec  ACTIVITY TOLERANCE: Activity tolerance: IND - tolerates 20 min standing activities   FUNCTIONAL OUTCOME MEASURES:    UPPER EXTREMITY ROM:   BUE AROM WFL   UPPER EXTREMITY MMT:     MMT Right eval Left eval  Shoulder flexion    Shoulder abduction    Shoulder adduction    Shoulder extension    Shoulder internal rotation  Shoulder external rotation    Middle trapezius    Lower trapezius    Elbow flexion    Elbow extension    Wrist flexion    Wrist extension    Wrist ulnar deviation    Wrist radial deviation    Wrist pronation    Wrist supination    (Blank rows = not tested)  HAND FUNCTION: Grip strength: Right: 76 lbs; Left: 55 lbs, Lateral pinch: Right: 15 lbs, Left: 11 lbs, and 3 point pinch: Right: 21 lbs, Left: 14 lbs  COORDINATION: 9 Hole Peg test: Right: 26 sec; Left: 10 sec  SENSATION:  12/17/23 Light touch: decreased sensation and numbness in L hand 5th digit Stereognosis:   Hot/Cold:   Proprioception: intact  EDEMA: mild swelling to L hand   COGNITION: Overall cognitive status: Within functional limits for tasks assessed  VISION: Subjective report: baseline Baseline vision: No visual deficits  VISION ASSESSMENT: WFL                                                                                                 TREATMENT DATE: 02/18/24  Moist heat applied to T-spine for pain management throughout session  Self Care:  -Instruction in use of button hook with trials on shirt and pants buttons; initial demo and min vc for use with good return demo.  Advised on options to obtain. -Reinforced benefits of visual feedback for management of clothing fasteners to compensate for sensory impairment in the L hand.  Therapeutic Activity: -Facilitated LUE pinch strengthening and GMC while reaching toward a target.  Pt worked to place  therapy resistant clothespins onto a horizontal and vertical dowel with reps of lateral, and 3 point pinch strengthening; requiring min vc for pinch prehension patterns. -Facilitated LUE forearm, wrist, and hand strength and coordination skills: EZ board tools: Pt worked with small and large base key turn and small and large dial turn for 2-3 reps for each tool (up/down board=1 rep).  Rest breaks between sets and min vc for technique to maximize ROM with each tool rotation against wide and narrow strips of velcro resistance.  Occasional min A from OT to keep tools level on board d/t lack of coordination.  PATIENT EDUCATION: Education details: ADL/AE strategies Person educated: Patient Education method: Explanation, Demonstration, and Verbal cues Education comprehension: verbalized understanding, returned demonstration, and verbal cues required  HOME EXERCISE PROGRAM: Blue theraputty [facility] - coins at tabletop  GOALS: Goals reviewed with patient? Yes  SHORT TERM GOALS: Target date: 01/26/24  Pt will IND complete HEP for functional strenghening of non-dominant LUE. Baseline: not initiated Goal status: INITIAL   LONG TERM GOALS: Target date: 03/08/24  Pt will increase L pinch strength by 5 # of force to securely hold a wrench during leisure activities. Baseline: L pinch 14# Goal status: INITIAL  2.  Pt will increase L grip strength by 15# to wash dishes without dropping items.  Baseline: L grip 55# Goal status: INITIAL  3.  Pt will improve LUE coordination by 30 seconds to independently manipulate fasteners during dressing tasks. Baseline: L 1'10.  Goal  status: INITIAL  4.  Pt will improve dynamic seated balance to don B shoes independently on edge of bed without loss of balance. Baseline: requires chair with armrest to don shoes with standby assist Goal status: INITIAL   ASSESSMENT: CLINICAL IMPRESSION: Pt reports continued difficulty with shirt and pants buttons.  Pt was  observed to attempt buttons with and without AE, with pt demonstrating improved efficiency using button hook.  Pt pleased with this and plans to obtain button hook of his own.  OT advised on options to obtain.  Good tolerance to LUE FMC/GMC/hand strengthening activities noted above.  Pt. continues to present with weakness, and decreased motor control in the left UE, and hand. Pt. continues to benefit from OT services to work on L grip and lateral pinch strength, L FMC skills, and dynamic sitting balance to improve functional independence of ADL/IADL tasks at home.   PERFORMANCE DEFICITS: in functional skills including ADLs, IADLs, coordination, dexterity, sensation, and edema and psychosocial skills including environmental adaptation.   IMPAIRMENTS: are limiting patient from ADLs, IADLs, and leisure.   CO-MORBIDITIES: may have co-morbidities  that affects occupational performance. Patient will benefit from skilled OT to address above impairments and improve overall function.  MODIFICATION OR ASSISTANCE TO COMPLETE EVALUATION: No modification of tasks or assist necessary to complete an evaluation.  OT OCCUPATIONAL PROFILE AND HISTORY: Problem focused assessment: Including review of records relating to presenting problem.  CLINICAL DECISION MAKING: LOW - limited treatment options, no task modification necessary  REHAB POTENTIAL: Good  EVALUATION COMPLEXITY: Moderate    PLAN:  OT FREQUENCY: 1-2x/week  OT DURATION: 12 weeks  PLANNED INTERVENTIONS: 97168 OT Re-evaluation, 97535 self care/ADL training, 02889 therapeutic exercise, 97530 therapeutic activity, and 97112 neuromuscular re-education  RECOMMENDED OTHER SERVICES: PT  CONSULTED AND AGREED WITH PLAN OF CARE: Patient and family member/caregiver  PLAN FOR NEXT SESSION: see above  Inocente Blazing, MS, OTR/L   02/20/2024, 5:12 PM

## 2024-02-23 ENCOUNTER — Ambulatory Visit

## 2024-02-25 ENCOUNTER — Ambulatory Visit

## 2024-02-25 DIAGNOSIS — G8929 Other chronic pain: Secondary | ICD-10-CM

## 2024-02-25 DIAGNOSIS — M6281 Muscle weakness (generalized): Secondary | ICD-10-CM | POA: Diagnosis not present

## 2024-02-25 DIAGNOSIS — R2689 Other abnormalities of gait and mobility: Secondary | ICD-10-CM

## 2024-02-25 DIAGNOSIS — R278 Other lack of coordination: Secondary | ICD-10-CM

## 2024-02-25 DIAGNOSIS — G4721 Circadian rhythm sleep disorder, delayed sleep phase type: Secondary | ICD-10-CM | POA: Diagnosis not present

## 2024-02-25 DIAGNOSIS — R293 Abnormal posture: Secondary | ICD-10-CM

## 2024-02-25 DIAGNOSIS — G4733 Obstructive sleep apnea (adult) (pediatric): Secondary | ICD-10-CM | POA: Diagnosis not present

## 2024-02-25 NOTE — Therapy (Signed)
 OUTPATIENT OCCUPATIONAL THERAPY NEURO TREATMENT NOTE  Patient Name: Thomas [name] MRN: [id number] DOB:10-13-1932, 89 y.o., male Today's Date: 02/29/2024  PCP: Charlie Forte REFERRING PROVIDER: [name]  END OF SESSION:  OT End of Session - 02/29/24 2002     Visit Number 7    Number of Visits 24    Date for Recertification  03/08/24    OT Start Time 1145    OT Stop Time 1230    OT Time Calculation (min) 45 min    Activity Tolerance Patient tolerated treatment well    Behavior During Therapy [facility] for tasks assessed/performed         Past Medical History:  Diagnosis Date   Aortic insufficiency    a. 07/2023 Echo: Mod AI; b. 09/2023 Echo: Mild-mod AI.   Aortic valve disorders    Arthritis    Basal cell carcinoma    face, nose left shoulder, left arm (06/19/2016)   Basal cell carcinoma 09/13/2020   right temple   BBB (bundle branch block)    hx right   CAD (coronary artery disease)    a. 10/2017 Cath: mod, nonobs dzs; b. 02/2021 Cath: mod, nonobs dzs; c. 11/2022 Cath: LAD 30ost/p, 3m, D1 min irregs, LCX 65p/m, OM1 min irregs, OM2 40, OM3 nl, RCA 30p, 54m, 40d w/ 40 in side branch-->Med rx; d. 11/2023 PCI: LAD 90ost/p (3.5x15 Onyx Frontier DES). Otw stable anatomy.   Chronic back pain    neck, thoracic, lower back (06/19/2016)   CKD (chronic kidney disease), stage III (HCC)    Complete heart block (HCC) 06/2016   a. 06/2016 s/p MDT PPM; b. 01/2023 s/p upgrade to MDT CRT-P (ser # [id number]).   GERD (gastroesophageal reflux disease)    Gout    Heart block    I've had type I, II Wenke before now (06/19/2016)   History of gout    History of hiatal hernia    self dx'd (06/19/2016)   Hyperlipidemia    Hypertension    Lyme disease    dx'd by me 2003; cx's showed dx 08/2015   Migraine    3-4/year (06/19/2016)   Mitral regurgitation    a. 07/2023 Echo: Mod MR.   NICM (nonischemic cardiomyopathy - in setting of RV pacing) (HCC)    a. 02/2019 Echo: EF 45-50%; b.  10/2022 Echo: EF 30-35%; c. 01/2023 CRT-P upgrade; e. 07/2023 Echo: EF 40-45%, glob HK; f. 10/2023 Echo: EF of 55 to 60% with grade 1 DD, nl RV fxn, triv MR, mild to mod AI, and mildly dilated ascending aorta at 42 mm.   PAF (paroxysmal atrial fibrillation) (HCC)    a. CHA2DS2VASc = 7--> eliquis ; b. On tikosyn .   Paroxysmal atrial flutter (HCC)    Presence of permanent cardiac pacemaker    PVC's (premature ventricular contractions)    Renal cancer, left (HCC) 2006   S/P cryotherapy   Spinal stenosis    cervical, 1 thoracic, lumbar (06/19/2016)   Squamous carcinoma    face, nose left shoulder, left arm (06/19/2016)   Stroke (HCC)    TIA (transient ischemic attack) 06/14/2016   I'm not sure that's what it was (06/25/2016)   Visit for monitoring Tikosyn  therapy 09/09/2017   Past Surgical History:  Procedure Laterality Date   ANKLE FRACTURE SURGERY Right 1967   BACK SURGERY  05/07/2020   BASAL CELL CARCINOMA EXCISION     face, nose left shoulder, left arm   BIOPSY PROSTATE  2001 & 2003   BIV  UPGRADE N/A 01/10/2023   Procedure: BIV UPGRADE;  Surgeon: Fernande Elspeth BROCKS, MD;  Location: [facility] INVASIVE CV LAB;  Service: Cardiovascular;  Laterality: N/A;   CARDIAC CATHETERIZATION  1990's   CARDIOVERSION N/A 09/11/2017   Procedure: CARDIOVERSION;  Surgeon: Pietro Redell RAMAN, MD;  Location[facility] ENDOSCOPY;  Service: Cardiovascular;  Laterality: N/A;   CORONARY STENT INTERVENTION N/A 11/10/2023   Procedure: CORONARY STENT INTERVENTION;  Surgeon: Darron Deatrice LABOR, MD;  Location: [facility] INVASIVE CV LAB;  Service: Cardiovascular;  Laterality: N/A;   FRACTURE SURGERY     HOLEP-LASER ENUCLEATION OF THE PROSTATE WITH MORCELLATION N/A 07/10/2020   Procedure: HOLEP-LASER ENUCLEATION OF THE PROSTATE WITH MORCELLATION;  Surgeon: Penne Knee, MD;  Location: [facility] ORS;  Service: Urology;  Laterality: N/A;   INGUINAL HERNIA REPAIR Left 2012   INSERT / REPLACE / REMOVE PACEMAKER  06/19/2016   LAPAROSCOPIC ABLATION  RENAL MASS     LEFT HEART CATH AND CORONARY ANGIOGRAPHY Left 10/23/2017   Procedure: LEFT HEART CATH AND CORONARY ANGIOGRAPHY;  Surgeon: Darron Deatrice LABOR, MD;  Location: [facility] INVASIVE CV LAB;  Service: Cardiovascular;  Laterality: Left;   LEFT HEART CATH AND CORONARY ANGIOGRAPHY Left 02/26/2021   Procedure: LEFT HEART CATH AND CORONARY ANGIOGRAPHY;  Surgeon: Darron Deatrice LABOR, MD;  Location: [facility] INVASIVE CV LAB;  Service: Cardiovascular;  Laterality: Left;   LEFT HEART CATH AND CORONARY ANGIOGRAPHY Left 11/10/2023   Procedure: LEFT HEART CATH AND CORONARY ANGIOGRAPHY;  Surgeon: Darron Deatrice LABOR, MD;  Location: [facility] INVASIVE CV LAB;  Service: Cardiovascular;  Laterality: Left;   PACEMAKER IMPLANT N/A 06/19/2016   Procedure: Pacemaker Implant;  Surgeon: Elspeth BROCKS Fernande, MD;  Location: [facility] INVASIVE CV LAB;  Service: Cardiovascular;  Laterality: N/A;   pacemasker     PROSTATE SURGERY     RIGHT/LEFT HEART CATH AND CORONARY ANGIOGRAPHY Bilateral 11/25/2022   Procedure: RIGHT/LEFT HEART CATH AND CORONARY ANGIOGRAPHY;  Surgeon: Darron Deatrice LABOR, MD;  Location: [facility] INVASIVE CV LAB;  Service: Cardiovascular;  Laterality: Bilateral;   SQUAMOUS CELL CARCINOMA EXCISION     face, nose left shoulder, left arm   TONSILLECTOMY AND ADENOIDECTOMY     Patient Active Problem List   Diagnosis Date Noted   Angina pectoris 11/10/2023   Weakness of left foot 10/29/2023   CKD stage 3b, GFR 30-44 ml/min (HCC) 10/29/2023   Obstructive sleep apnea 10/29/2023   Combined systolic and diastolic congestive heart failure (HCC) 10/29/2023   AKI (acute kidney injury) 04/29/2023   UTI (urinary tract infection) 04/29/2023   Chronic systolic heart failure (HCC) 11/25/2022   Sick sinus syndrome (HCC) 02/26/2022   Closed fracture of second lumbar vertebra (HCC) 09/26/2021   Dysphagia, unspecified 09/26/2021   Heart block 09/26/2021   Heartburn 09/26/2021   History of lumbar fusion 09/26/2021   Inflammation of sacroiliac  joint 09/26/2021   Other symptoms and signs involving cognitive functions and awareness 09/26/2021   Prediabetes 09/26/2021   Low back pain 09/26/2021   Renal cell carcinoma (HCC) 09/26/2021   Essential hypertension 09/26/2021   Cerebrovascular disease 09/26/2021   Transient cerebral ischemic attack, unspecified 09/26/2021   Cerebrovascular disease, unspecified 09/26/2021   Transient ischemic attack 09/26/2021   Unstable angina (HCC)    Lumbar burst fracture (HCC) 05/04/2020   Hypogonadism male 10/10/2019   Persistent atrial fibrillation (HCC) 01/12/2019   Acute low back pain 11/04/2017   Abnormal screening cardiac CT    Visit for monitoring Tikosyn  therapy 09/09/2017   History of TIA (transient ischemic attack) 08/29/2016  Paroxysmal atrial fibrillation (HCC) 07/23/2016   CAD (coronary artery disease) 07/23/2016   Snores 06/27/2016   S/P placement of cardiac pacemaker 06/25/2016   Hemiparesis (HCC) 06/25/2016   Acute ischemic stroke (HCC)    Left-sided weakness    Hemisensory loss    Dysarthria    Stroke-like symptoms    Complete heart block (HCC)    Pain in thoracic spine    Orthostasis    Lethargy    Vertebral artery occlusion, left    TIA (transient ischemic attack) 06/14/2016   Arthritis 02/06/2015   Esophageal reflux 02/06/2015   Arthritis urica 02/06/2015   Cannot sleep 02/06/2015   Arthritis, degenerative 02/06/2015   Adenocarcinoma, renal cell (HCC) 02/06/2015   Benign prostatic hyperplasia with lower urinary tract symptoms 11/21/2014   Personal history of other malignant neoplasm of kidney 11/21/2014   History of Lyme disease 05/06/2014   Palpitations 12/31/2013   Hyperlipidemia 11/02/2010   HYPERTENSION, BENIGN 04/16/2010   ONSET DATE: 11/10/23  REFERRING DIAG: CVA  THERAPY DIAG:  Muscle weakness (generalized)  Other lack of coordination  Rationale for Evaluation and Treatment: Rehabilitation  SUBJECTIVE:  SUBJECTIVE STATEMENT: Pt reports his back  pain increased yesterday after his reclined chair at the dentist office sat him up abruptly.  Pt reports he plans to get his back checked out if the pain doesn't improve soon. Pt accompanied by: self and significant other  PERTINENT HISTORY: 89 year old male who was admitted to hospital on 11/10/23 for management of unstable angina after pt developed clumsiness of the left arm after cardiac catheterization 7/7, workup for TIA. Per neurology note symptoms secondary to relative cerebral hypoperfusion in the setting of heart disease and right carotid artery and M1 stenosis. PMhx: PAF on Eliquis , HTN, HLD, prediabetes, OSA on CPAP, dominant left vertebral artery occlusion, TIA, SSS s/p PPM, BPH, renal CA, lumbar fusion, A-fib.  PRECAUTIONS: None  WEIGHT BEARING RESTRICTIONS: No  PAIN: 02/25/24: 7/10 thoracic spine Are you having pain? 4/10  back pain  FALLS: Has patient fallen in last 6 months? Yes. Number of falls 4  LIVING ENVIRONMENT: Lives with: lives with their spouse Lives in: House/apartment Stairs: Yes: Internal: 2 steps; none Has following equipment at home: Single point cane and Walker - 2 wheeled  PLOF: Independent with basic ADLs  PATIENT GOALS: to go to [facility] for more detailed neuro testing and return to function in LUE  OBJECTIVE:  Note: Objective measures were completed at Evaluation unless otherwise noted.  HAND DOMINANCE: Right  ADLs: Overall ADLs: MIN A Transfers/ambulation related to ADLs: Eating: IND - uses dominant R Grooming: SETUP UB Dressing: SETUP - buttons assist LB Dressing: MIN - hiking pants  Toileting: IND Bathing: IND Tub Shower transfers: IND Equipment: none  IADLs: Shopping: spouse completes as needed Light housekeeping: spouse completes as needed Meal Prep: assist from spouse Community mobility: SPC/RW as needed Medication management: IND Landscape architect: IND Handwriting: 100% legible R hand  MOBILITY STATUS: Independent and Hx of  falls  POSTURE COMMENTS:  rounded shoulders and forward head Sitting balance: Sits without UE support up to 30 sec  ACTIVITY TOLERANCE: Activity tolerance: IND - tolerates 20 min standing activities   FUNCTIONAL OUTCOME MEASURES:    UPPER EXTREMITY ROM:   BUE AROM WFL   UPPER EXTREMITY MMT:     MMT Right eval Left eval  Shoulder flexion    Shoulder abduction    Shoulder adduction    Shoulder extension    Shoulder internal rotation  Shoulder external rotation    Middle trapezius    Lower trapezius    Elbow flexion    Elbow extension    Wrist flexion    Wrist extension    Wrist ulnar deviation    Wrist radial deviation    Wrist pronation    Wrist supination    (Blank rows = not tested)  HAND FUNCTION: Grip strength: Right: 76 lbs; Left: 55 lbs, Lateral pinch: Right: 15 lbs, Left: 11 lbs, and 3 point pinch: Right: 21 lbs, Left: 14 lbs  COORDINATION: 9 Hole Peg test: Right: 26 sec; Left: 10 sec  SENSATION:  12/17/23 Light touch: decreased sensation and numbness in L hand 5th digit Stereognosis:   Hot/Cold:   Proprioception: intact  EDEMA: mild swelling to L hand   COGNITION: Overall cognitive status: Within functional limits for tasks assessed  VISION: Subjective report: baseline Baseline vision: No visual deficits  VISION ASSESSMENT: WFL                                                                                                 TREATMENT DATE: 02/25/24  Moist heat applied to T-spine for pain management throughout session  Therapeutic Activity: -Facilitated LUE pinch strengthening and GMC while reaching toward a target.  Pt worked to place therapy resistant clothespins onto a horizontal and vertical dowel with reps of lateral, and 3 point pinch strengthening; requiring min vc for pinch prehension patterns. -Facilitated L hand grip strengthening and [facility] skills, working to place jumbo pegs into pegboard and remove with hand gripper set at 17.9#  for 3 trials.  Self Care -Condition management education: Reviewed morning med schedule d/t increased back pain in the a.m.  Pt reports he has to get out of bed to take a med at a specific time, but back pain is very painful in the a.m.  Encouraged heating pad in the a.m. in bed, move morning meds to bedside table.  Instructed pt in editing phone alarm for morning meds per pt reporting he may shift his time.  This will also allow pt to use heating pad in bed if desired.  Otherwise recommended a.m. hot shower rather than evening shower for pain management in the morning.  Pt receptive to all.   PATIENT EDUCATION: Education details: Pain management strategies Person educated: Patient Education method: Explanation and Verbal cues Education comprehension: verbalized understanding  HOME EXERCISE PROGRAM: Blue theraputty [facility] - coins at tabletop  GOALS: Goals reviewed with patient? Yes  SHORT TERM GOALS: Target date: 01/26/24  Pt will IND complete HEP for functional strenghening of non-dominant LUE. Baseline: not initiated Goal status: INITIAL   LONG TERM GOALS: Target date: 03/08/24  Pt will increase L pinch strength by 5 # of force to securely hold a wrench during leisure activities. Baseline: L pinch 14# Goal status: INITIAL  2.  Pt will increase L grip strength by 15# to wash dishes without dropping items.  Baseline: L grip 55# Goal status: INITIAL  3.  Pt will improve LUE coordination by 30 seconds to independently manipulate fasteners during dressing tasks. Baseline: L 1'10.  Goal status: INITIAL  4.  Pt will improve dynamic seated balance to don B shoes independently on edge of bed without loss of balance. Baseline: requires chair with armrest to don shoes with standby assist Goal status: INITIAL   ASSESSMENT: CLINICAL IMPRESSION: Pt continues to report high pain levels in T-spine, but has MD appt later today and plans to request referral for imaging.  Pt receptive to  pain management strategies reviewed above.  Good tolerance to LUE strengthening/coordination training this date.  Pt. continues to present with weakness, and decreased motor control in the LUE proximally and distally.  Pt. continues to benefit from OT services to work on L grip and lateral pinch strength, L FMC skills, and dynamic sitting balance to improve functional independence of ADL/IADL tasks at home.   PERFORMANCE DEFICITS: in functional skills including ADLs, IADLs, coordination, dexterity, sensation, and edema and psychosocial skills including environmental adaptation.   IMPAIRMENTS: are limiting patient from ADLs, IADLs, and leisure.   CO-MORBIDITIES: may have co-morbidities  that affects occupational performance. Patient will benefit from skilled OT to address above impairments and improve overall function.  MODIFICATION OR ASSISTANCE TO COMPLETE EVALUATION: No modification of tasks or assist necessary to complete an evaluation.  OT OCCUPATIONAL PROFILE AND HISTORY: Problem focused assessment: Including review of records relating to presenting problem.  CLINICAL DECISION MAKING: LOW - limited treatment options, no task modification necessary  REHAB POTENTIAL: Good  EVALUATION COMPLEXITY: Moderate    PLAN:  OT FREQUENCY: 1-2x/week  OT DURATION: 12 weeks  PLANNED INTERVENTIONS: 97168 OT Re-evaluation, 97535 self care/ADL training, 02889 therapeutic exercise, 97530 therapeutic activity, and 97112 neuromuscular re-education  RECOMMENDED OTHER SERVICES: PT  CONSULTED AND AGREED WITH PLAN OF CARE: Patient and family member/caregiver  PLAN FOR NEXT SESSION: see above  Inocente Blazing, MS, OTR/L  02/29/2024, 8:05 PM

## 2024-02-25 NOTE — Therapy (Addendum)
 OUTPATIENT PHYSICAL THERAPY NEURO TREATMENT  Patient Name: Thomas [name] MRN: [id number] DOB:09-03-1932, 89 y.o., male Today's Date: 12/15/2023   PCP: [name], MD  REFERRING PROVIDER: Darron Deatrice LABOR, MD    PT End of Session - 02/25/24 1109     Visit Number 11    Number of Visits 25    Date for Recertification  03/08/24    Progress Note Due on Visit 20    PT Start Time 1100    PT Stop Time 1112    PT Time Calculation (min) 12 min    Equipment Utilized During Treatment Gait belt    Activity Tolerance Patient tolerated treatment well    Behavior During Therapy [facility] for tasks assessed/performed                Past Medical History:  Diagnosis Date   Aortic insufficiency    a. 07/2023 Echo: Mod AI; b. 09/2023 Echo: Mild-mod AI.   Aortic valve disorders    Arthritis    Basal cell carcinoma    face, nose left shoulder, left arm (06/19/2016)   Basal cell carcinoma 09/13/2020   right temple   BBB (bundle branch block)    hx right   CAD (coronary artery disease)    a. 10/2017 Cath: mod, nonobs dzs; b. 02/2021 Cath: mod, nonobs dzs; c. 11/2022 Cath: LAD 30ost/p, 68m, D1 min irregs, LCX 65p/m, OM1 min irregs, OM2 40, OM3 nl, RCA 30p, 71m, 40d w/ 40 in side branch-->Med rx.   Chronic back pain    neck, thoracic, lower back (06/19/2016)   CKD (chronic kidney disease), stage III (HCC)    Complete heart block (HCC) 06/2016   a. 06/2016 s/p MDT PPM; b. 01/2023 s/p upgrade to MDT CRT-P (ser # [id number]).   GERD (gastroesophageal reflux disease)    Gout    Heart block    I've had type I, II Wenke before now (06/19/2016)   History of gout    History of hiatal hernia    self dx'd (06/19/2016)   Hyperlipidemia    Hypertension    Lyme disease    dx'd by me 2003; cx's showed dx 08/2015   Migraine    3-4/year (06/19/2016)   Mitral regurgitation    a. 07/2023 Echo: Mod MR.   NICM (nonischemic cardiomyopathy - in setting of RV pacing) (HCC)    a. 02/2019 Echo: EF  45-50%; b. 10/2022 Echo: EF 30-35%; c. 01/2023 CRT-P upgrade; e. 07/2023 Echo: EF 40-45%, glob HK; f. 10/2023 Echo: EF of 55 to 60% with grade 1 DD, nl RV fxn, triv MR, mild to mod AI, and mildly dilated ascending aorta at 42 mm.   PAF (paroxysmal atrial fibrillation) (HCC)    a. CHA2DS2VASc = 7--> eliquis ; b. On tikosyn .   Paroxysmal atrial flutter (HCC)    Presence of permanent cardiac pacemaker    PVC's (premature ventricular contractions)    Renal cancer, left (HCC) 2006   S/P cryotherapy   Spinal stenosis    cervical, 1 thoracic, lumbar (06/19/2016)   Squamous carcinoma    face, nose left shoulder, left arm (06/19/2016)   Stroke [facility])    TIA (transient ischemic attack) 06/14/2016   I'm not sure that's what it was (06/25/2016)   Visit for monitoring Tikosyn  therapy 09/09/2017   Past Surgical History:  Procedure Laterality Date   ANKLE FRACTURE SURGERY Right 1967   BACK SURGERY  05/07/2020   BASAL CELL CARCINOMA EXCISION     face,  nose left shoulder, left arm   BIOPSY PROSTATE  2001 & 2003   BIV UPGRADE N/A 01/10/2023   Procedure: BIV UPGRADE;  Surgeon: Fernande Elspeth BROCKS, MD;  Location: [facility] INVASIVE CV LAB;  Service: Cardiovascular;  Laterality: N/A;   CARDIAC CATHETERIZATION  1990's   CARDIOVERSION N/A 09/11/2017   Procedure: CARDIOVERSION;  Surgeon: Pietro Redell RAMAN, MD;  Location: [facility] ENDOSCOPY;  Service: Cardiovascular;  Laterality: N/A;   CORONARY STENT INTERVENTION N/A 11/10/2023   Procedure: CORONARY STENT INTERVENTION;  Surgeon: Darron Deatrice LABOR, MD;  Location: [facility] INVASIVE CV LAB;  Service: Cardiovascular;  Laterality: N/A;   FRACTURE SURGERY     HOLEP-LASER ENUCLEATION OF THE PROSTATE WITH MORCELLATION N/A 07/10/2020   Procedure: HOLEP-LASER ENUCLEATION OF THE PROSTATE WITH MORCELLATION;  Surgeon: Penne Knee, MD;  Location: [facility] ORS;  Service: Urology;  Laterality: N/A;   INGUINAL HERNIA REPAIR Left 2012   INSERT / REPLACE / REMOVE PACEMAKER  06/19/2016   LAPAROSCOPIC  ABLATION RENAL MASS     LEFT HEART CATH AND CORONARY ANGIOGRAPHY Left 10/23/2017   Procedure: LEFT HEART CATH AND CORONARY ANGIOGRAPHY;  Surgeon: Darron Deatrice LABOR, MD;  Location: [facility] INVASIVE CV LAB;  Service: Cardiovascular;  Laterality: Left;   LEFT HEART CATH AND CORONARY ANGIOGRAPHY Left 02/26/2021   Procedure: LEFT HEART CATH AND CORONARY ANGIOGRAPHY;  Surgeon: Darron Deatrice LABOR, MD;  Location: [facility] INVASIVE CV LAB;  Service: Cardiovascular;  Laterality: Left;   LEFT HEART CATH AND CORONARY ANGIOGRAPHY Left 11/10/2023   Procedure: LEFT HEART CATH AND CORONARY ANGIOGRAPHY;  Surgeon: Darron Deatrice LABOR, MD;  Location: [facility] INVASIVE CV LAB;  Service: Cardiovascular;  Laterality: Left;   PACEMAKER IMPLANT N/A 06/19/2016   Procedure: Pacemaker Implant;  Surgeon: Elspeth BROCKS Fernande, MD;  Location: [facility] INVASIVE CV LAB;  Service: Cardiovascular;  Laterality: N/A;   pacemasker     PROSTATE SURGERY     RIGHT/LEFT HEART CATH AND CORONARY ANGIOGRAPHY Bilateral 11/25/2022   Procedure: RIGHT/LEFT HEART CATH AND CORONARY ANGIOGRAPHY;  Surgeon: Darron Deatrice LABOR, MD;  Location: [facility] INVASIVE CV LAB;  Service: Cardiovascular;  Laterality: Bilateral;   SQUAMOUS CELL CARCINOMA EXCISION     face, nose left shoulder, left arm   TONSILLECTOMY AND ADENOIDECTOMY     Patient Active Problem List   Diagnosis Date Noted   Angina pectoris (HCC) 11/10/2023   Weakness of left foot 10/29/2023   CKD stage 3b, GFR 30-44 ml/min (HCC) 10/29/2023   Obstructive sleep apnea 10/29/2023   Combined systolic and diastolic congestive heart failure (HCC) 10/29/2023   AKI (acute kidney injury) (HCC) 04/29/2023   UTI (urinary tract infection) 04/29/2023   Chronic systolic heart failure (HCC) 11/25/2022   Sick sinus syndrome (HCC) 02/26/2022   Closed fracture of second lumbar vertebra (HCC) 09/26/2021   Dysphagia, unspecified 09/26/2021   Heart block 09/26/2021   Heartburn 09/26/2021   History of lumbar fusion 09/26/2021    Inflammation of sacroiliac joint (HCC) 09/26/2021   Other symptoms and signs involving cognitive functions and awareness 09/26/2021   Prediabetes 09/26/2021   Low back pain 09/26/2021   Renal cell carcinoma (HCC) 09/26/2021   Essential hypertension 09/26/2021   Cerebrovascular disease 09/26/2021   Transient cerebral ischemic attack, unspecified 09/26/2021   Cerebrovascular disease, unspecified 09/26/2021   Transient ischemic attack 09/26/2021   Unstable angina (HCC)    Lumbar burst fracture (HCC) 05/04/2020   Hypogonadism male 10/10/2019   Persistent atrial fibrillation (HCC) 01/12/2019   Acute low back pain 11/04/2017   Abnormal screening cardiac  CT    Visit for monitoring Tikosyn  therapy 09/09/2017   History of TIA (transient ischemic attack) 08/29/2016   Paroxysmal atrial fibrillation (HCC) 07/23/2016   CAD (coronary artery disease) 07/23/2016   Snores 06/27/2016   S/P placement of cardiac pacemaker 06/25/2016   Hemiparesis (HCC) 06/25/2016   Acute ischemic stroke (HCC)    Left-sided weakness    Hemisensory loss    Dysarthria    Stroke-like symptoms    Complete heart block (HCC)    Pain in thoracic spine    Orthostasis    Lethargy    Vertebral artery occlusion, left    TIA (transient ischemic attack) 06/14/2016   Arthritis 02/06/2015   Esophageal reflux 02/06/2015   Arthritis urica 02/06/2015   Cannot sleep 02/06/2015   Arthritis, degenerative 02/06/2015   Adenocarcinoma, renal cell (HCC) 02/06/2015   Benign prostatic hyperplasia with lower urinary tract symptoms 11/21/2014   Personal history of other malignant neoplasm of kidney 11/21/2014   History of Lyme disease 05/06/2014   Palpitations 12/31/2013   Hyperlipidemia 11/02/2010   HYPERTENSION, BENIGN 04/16/2010    ONSET DATE: 05/06/2016   REFERRING DIAG:  R20.0 (ICD-10-CM) - Anesthesia of skin  R29.90 (ICD-10-CM) - Unspecified symptoms and signs involving the nervous system  G45.9 (ICD-10-CM) - Transient  cerebral ischemic attack, unspecified    THERAPY DIAG:  No diagnosis found.  Rationale for Evaluation and Treatment: Rehabilitation  SUBJECTIVE:                                                                                                                                                                                             SUBJECTIVE STATEMENT:  TODAY: Patient reports overall increased back pain and rating at a 7/10. States he is going to have to see his MD.   From recent: Patient reports back continued back pain rated 3/10 today in the mid-upper tspine. Believes that he may have a a Hairline fx in Tspine. Acknowledges that he should have orthopedic consult if pain continues.   Pt reports that he he has had a busy week with multiple doctor's appointments and meetings. States he is hoping to have a few days where he can rest without  constantly having to run around.     From EVAL: Pt accompanied by spouse who was a Interior and spatial designer of women's care at the hospital.  Pt reported he had a stroke in 2018 and a stent placed in June of 2025.  Pt notes that after the stint being placed, he felt numbness on the L side of the body.  Pt reports some weakness in the L LE, but more of the  symptoms were found to be in the UE.    Pt had a prostate surgery back in 2022, and was seeing Shin-Yiing for issues in the sacroilliac region.    Pt accompanied by: significant other  PERTINENT HISTORY: Pt has prior history of pacemaker, heart block, stints, and stroke.    PAIN:  Are you having pain? No  PRECAUTIONS: Fall and ICD/Pacemaker  RED FLAGS: Cauda equina syndrome: Yes: pt reporting some incontinence issues and numbness and tingling in the genital region.     WEIGHT BEARING RESTRICTIONS: No  FALLS: Has patient fallen in last 6 months? Yes. Number of falls 2 falls in April.  Pt fell and broke ribs back in April, but has had more near miss falls lately due to the L LE giving out.    LIVING  ENVIRONMENT: Lives with: lives with their spouse Lives in: House/apartment Stairs: Yes: External: 2 steps; none Has following equipment at home: None  PLOF: Independent  PATIENT GOALS: To have a normal walking gait, and reduce pain in the low back.  Increase flexibility.  OBJECTIVE:  Note: Objective measures were completed at Evaluation unless otherwise noted.  DIAGNOSTIC FINDINGS:    EXAM: CT HEAD WITHOUT CONTRAST  IMPRESSION: 1.  No evidence of an acute intracranial abnormality. 2. Parenchymal atrophy and chronic small vessel ischemic disease.   COGNITION: Overall cognitive status: Within functional limits for tasks assessed   SENSATION: WFL  COORDINATION: WFL   POSTURE: rounded shoulders, forward head, decreased lumbar lordosis, and increased thoracic kyphosis  LOWER EXTREMITY ROM:     Active  Right Eval Left Eval  Hip flexion    Hip extension    Hip abduction    Hip adduction    Hip internal rotation    Hip external rotation    Knee flexion    Knee extension    Ankle dorsiflexion    Ankle plantarflexion    Ankle inversion    Ankle eversion     (Blank rows = not tested)  LOWER EXTREMITY MMT:    MMT Right Eval Left Eval  Hip flexion 5 4  Hip extension    Hip abduction 5 4-  Hip adduction 5 4-  Hip internal rotation    Hip external rotation    Knee flexion 4+ 4-  Knee extension 4 4-  Ankle dorsiflexion 5 5  Ankle plantarflexion    Ankle inversion    Ankle eversion    (Blank rows = not tested)  BED MOBILITY:  Not tested  STAIRS: Not tested  FUNCTIONAL TESTS:   5 times sit to stand: 8.80 sec  Timed up and go (TUG): 8.61  miniBest Test: TBD  Single LE Balance Test: TBD  Dual-task Goal: TBD  Functional gait assessment: TBD  PATIENT SURVEYS:  ABC scale: The Activities-Specific Balance Confidence (ABC) Scale 0% 10 20 30  40 50 60 70 80 90 100% No confidence<->completely confident  "How confident are you that you will not lose  your balance or become unsteady when you . . .   Date tested 12/15/23  02/18/2024  Walk around the house 90% 80  2. Walk up or down stairs 90% 80  3. Bend over and pick up a slipper from in front of a closet floor 90% 90  4. Reach for a small can off a shelf at eye level 100% 100  5. Stand on tip toes and reach for something above your head 100% 80  6. Stand on a chair and reach for something  80% 90  7. Sweep the floor 90% 100  8. Walk outside the house to a car parked in the driveway 90% 100  9. Get into or out of a car 80% 70  10. Walk across a parking lot to the mall 90% 100  11. Walk up or down a ramp 80% 100  12. Walk in a crowded mall where people rapidly walk past you 90% 100  13. Are bumped into by people as you walk through the mall 80% 90  14. Step onto or off of an escalator while you are holding onto the railing 100% 100  15. Step onto or off an escalator while holding onto parcels such that you cannot hold onto the railing 80% 80  16. Walk outside on icy sidewalks 90% 70  Total: 16/16  88.75% 89.38 %                                                                                                                                 TREATMENT DATE: 02/25/24  Non-billable visit:  Patient arrived in 7/10 ongoing low back pain. Discussed that his insurance only approved him for 3 more visits from 02/18/2024-03/17/2024 and he only has 2 visits left including today. Discussed holding remaining visits until he is seen by MD to further evaluate his back as he is limited in progressing his balance  due to ongoing back pain. Patient verbalized understanding.     PATIENT EDUCATION: Education details: Pt educated on role of PT and services provided during current POC, along with prognosis and information about the clinic. Person educated: Patient and Spouse Education method: Explanation Education comprehension: verbalized understanding  HOME EXERCISE PROGRAM: Access Code:  [email or web address] URL: [email or web address] Date: 12/17/2023 Prepared by: Reyes London  Exercises - Sidelying Hip Abduction with Resistance at Ankle  - 3 x weekly - 3 sets - 10 reps - Clamshell with Resistance  - 3 x weekly - 3 sets - 10 reps - Tandem Walking with Counter Support  - 3 x weekly - 3 sets - 10 reps    Access Code: [email or web address] URL: [email or web address] Date: 12/15/2023 Prepared by: Sidra Simpers  Program Notes **Be sure to perform all exercises next to a countertop or sturdy piece of furniture in case you become unsteady.  Exercises - Standing Heel Raise with Support  - 1 x daily - 7 x weekly - 2 sets - 15 reps - Standing Toe Raises at Chair  - 1 x daily - 7 x weekly - 2 sets - 15 reps - Mini Squat with Counter Support  - 1 x daily - 7 x weekly - 2 sets - 15 reps - Lunge with Counter Support  - 1 x daily - 7 x weekly - 2 sets - 15 reps - Standing Tandem Balance with Counter Support  - 1 x daily - 7 x weekly - 2 sets - 2 reps - 30 hold - Standing Single Leg  Stance with Counter Support  - 1 x daily - 7 x weekly - 2 sets - 2 reps - 30 hold  GOALS: Goals reviewed with patient? Yes  SHORT TERM GOALS: Target date: 01/12/2024  Pt will be independent with HEP in order to demonstrate increased ability to perform tasks related to occupation/hobbies. Baseline: Patient reports doing well with all activities and no questions at this time. Goal status: MET   LONG TERM GOALS: Target date: 03/08/2024  Pt will improve ABC by at least 8% in order to demonstrate improvement in balance confidence. Baseline: 88.75%; 02/18/2024= 89.38% Goal status: PROGRESSING   2.  Patient will increase Berg Balance score by > 6 points to demonstrate decreased fall risk during functional activities. Baseline: 12/17/2023= 48/56; 02/18/2024= 52/56 Goal status: PROGRESSING  3.  Patient will increase FGA score by > 2 points to demonstrate decreased fall risk during functional  activities. Baseline: 12/17/2023= 27/30; 02/18/2024= 27/30 Goal status: ONGOING  4.  Patient will increase six minute walk test distance to >1400 for progression to community ambulator and improve gait ability Baseline: TBD; 01/15/2024= 1281 feet;  Goal status: INITIAL    ASSESSMENT:  CLINICAL IMPRESSION: No treatment today - non- billable visit- Patient arrived in 7/10 ongoing low back pain. Discussed that his insurance only approved him for 3 more visits from 02/18/2024-03/17/2024 and he only has 2 visits left including today. Discussed holding remaining visits until he is seen by MD to further evaluate his back as he is limited in progressing his balance  due to ongoing back pain. Patient verbalized understanding.    OBJECTIVE IMPAIRMENTS: Abnormal gait, decreased activity tolerance, decreased balance, decreased coordination, decreased endurance, decreased mobility, difficulty walking, decreased strength, decreased safety awareness, and impaired sensation.   ACTIVITY LIMITATIONS: carrying, lifting, bending, and stairs  PARTICIPATION LIMITATIONS: cleaning, community activity, and yard work  PERSONAL FACTORS: Age, Past/current experiences, and 3+ comorbidities: TIA, pacemaker, low back pain, thoracic back pain, L sided weakness are also affecting patient's functional outcome.   REHAB POTENTIAL: Good  CLINICAL DECISION MAKING: Stable/uncomplicated  EVALUATION COMPLEXITY: Low  PLAN:  PT FREQUENCY: 1-2x/week  PT DURATION: 12 weeks  PLANNED INTERVENTIONS: 97750- Physical Performance Testing, 97110-Therapeutic exercises, 97530- Therapeutic activity, 97112- Neuromuscular re-education, 97535- Self Care, [id number]- Manual therapy, [phone]- Gait training, Joint mobilization, Spinal mobilization, and Vestibular training  PLAN FOR NEXT SESSION:   On hold until paitent seen by his back MD- Patient aware- please look for any f/u or recommendations from MD 6 min walk test- goal assessment High  level dynamic balance activities.  dual task balance activities Progressive LE strengthening.   Reyes LOISE London PT  Physical Therapist- [facility]   11:34 AM 02/25/24

## 2024-02-28 ENCOUNTER — Ambulatory Visit: Payer: Self-pay | Admitting: Cardiology

## 2024-03-01 ENCOUNTER — Ambulatory Visit

## 2024-03-01 DIAGNOSIS — M6281 Muscle weakness (generalized): Secondary | ICD-10-CM

## 2024-03-01 DIAGNOSIS — R278 Other lack of coordination: Secondary | ICD-10-CM

## 2024-03-01 NOTE — Therapy (Signed)
 OUTPATIENT OCCUPATIONAL THERAPY NEURO TREATMENT NOTE  Patient Name: Thomas [name] MRN: [id number] DOB:[birth year], 89 y.o., male Today's Date: 03/01/2024  PCP: Charlie Forte REFERRING PROVIDER: [name]  END OF SESSION:  OT End of Session - 03/01/24 1607     Visit Number 8    Number of Visits 24    Date for Recertification  03/08/24    OT Start Time 1152    OT Stop Time 1236    OT Time Calculation (min) 44 min    Activity Tolerance Patient tolerated treatment well    Behavior During Therapy [facility] for tasks assessed/performed         Past Medical History:  Diagnosis Date   Aortic insufficiency    a. 07/2023 Echo: Mod AI; b. 09/2023 Echo: Mild-mod AI.   Aortic valve disorders    Arthritis    Basal cell carcinoma    face, nose left shoulder, left arm (06/19/2016)   Basal cell carcinoma 09/13/2020   right temple   BBB (bundle branch block)    hx right   CAD (coronary artery disease)    a. 10/2017 Cath: mod, nonobs dzs; b. 02/2021 Cath: mod, nonobs dzs; c. 11/2022 Cath: LAD 30ost/p, 46m, D1 min irregs, LCX 65p/m, OM1 min irregs, OM2 40, OM3 nl, RCA 30p, 16m, 40d w/ 40 in side branch-->Med rx; d. 11/2023 PCI: LAD 90ost/p (3.5x15 Onyx Frontier DES). Otw stable anatomy.   Chronic back pain    neck, thoracic, lower back (06/19/2016)   CKD (chronic kidney disease), stage III (HCC)    Complete heart block (HCC) 06/2016   a. 06/2016 s/p MDT PPM; b. 01/2023 s/p upgrade to MDT CRT-P (ser # [id number]).   GERD (gastroesophageal reflux disease)    Gout    Heart block    I've had type I, II Wenke before now (06/19/2016)   History of gout    History of hiatal hernia    self dx'd (06/19/2016)   Hyperlipidemia    Hypertension    Lyme disease    dx'd by me 2003; cx's showed dx 08/2015   Migraine    3-4/year (06/19/2016)   Mitral regurgitation    a. 07/2023 Echo: Mod MR.   NICM (nonischemic cardiomyopathy - in setting of RV pacing) (HCC)    a. 02/2019 Echo: EF 45-50%; b.  10/2022 Echo: EF 30-35%; c. 01/2023 CRT-P upgrade; e. 07/2023 Echo: EF 40-45%, glob HK; f. 10/2023 Echo: EF of 55 to 60% with grade 1 DD, nl RV fxn, triv MR, mild to mod AI, and mildly dilated ascending aorta at 42 mm.   PAF (paroxysmal atrial fibrillation) (HCC)    a. CHA2DS2VASc = 7--> eliquis ; b. On tikosyn .   Paroxysmal atrial flutter (HCC)    Presence of permanent cardiac pacemaker    PVC's (premature ventricular contractions)    Renal cancer, left (HCC) 2006   S/P cryotherapy   Spinal stenosis    cervical, 1 thoracic, lumbar (06/19/2016)   Squamous carcinoma    face, nose left shoulder, left arm (06/19/2016)   Stroke (HCC)    TIA (transient ischemic attack) 06/14/2016   I'm not sure that's what it was (06/25/2016)   Visit for monitoring Tikosyn  therapy 09/09/2017   Past Surgical History:  Procedure Laterality Date   ANKLE FRACTURE SURGERY Right 1967   BACK SURGERY  05/07/2020   BASAL CELL CARCINOMA EXCISION     face, nose left shoulder, left arm   BIOPSY PROSTATE  2001 & 2003   BIV  UPGRADE N/A 01/10/2023   Procedure: BIV UPGRADE;  Surgeon: Fernande Elspeth BROCKS, MD;  Location: [facility] INVASIVE CV LAB;  Service: Cardiovascular;  Laterality: N/A;   CARDIAC CATHETERIZATION  1990's   CARDIOVERSION N/A 09/11/2017   Procedure: CARDIOVERSION;  Surgeon: Pietro Redell RAMAN, MD;  Location: [facility] ENDOSCOPY;  Service: Cardiovascular;  Laterality: N/A;   CORONARY STENT INTERVENTION N/A 11/10/2023   Procedure: CORONARY STENT INTERVENTION;  Surgeon: Darron Deatrice LABOR, MD;  Location: [facility] INVASIVE CV LAB;  Service: Cardiovascular;  Laterality: N/A;   FRACTURE SURGERY     HOLEP-LASER ENUCLEATION OF THE PROSTATE WITH MORCELLATION N/A 07/10/2020   Procedure: HOLEP-LASER ENUCLEATION OF THE PROSTATE WITH MORCELLATION;  Surgeon: Penne Knee, MD;  Location: [facility] ORS;  Service: Urology;  Laterality: N/A;   INGUINAL HERNIA REPAIR Left 2012   INSERT / REPLACE / REMOVE PACEMAKER  06/19/2016   LAPAROSCOPIC ABLATION  RENAL MASS     LEFT HEART CATH AND CORONARY ANGIOGRAPHY Left 10/23/2017   Procedure: LEFT HEART CATH AND CORONARY ANGIOGRAPHY;  Surgeon: Darron Deatrice LABOR, MD;  Location: [facility] INVASIVE CV LAB;  Service: Cardiovascular;  Laterality: Left;   LEFT HEART CATH AND CORONARY ANGIOGRAPHY Left 02/26/2021   Procedure: LEFT HEART CATH AND CORONARY ANGIOGRAPHY;  Surgeon: Darron Deatrice LABOR, MD;  Location: [facility] INVASIVE CV LAB;  Service: Cardiovascular;  Laterality: Left;   LEFT HEART CATH AND CORONARY ANGIOGRAPHY Left 11/10/2023   Procedure: LEFT HEART CATH AND CORONARY ANGIOGRAPHY;  Surgeon: Darron Deatrice LABOR, MD;  Location: [facility] INVASIVE CV LAB;  Service: Cardiovascular;  Laterality: Left;   PACEMAKER IMPLANT N/A 06/19/2016   Procedure: Pacemaker Implant;  Surgeon: Elspeth BROCKS Fernande, MD;  Location: [facility] INVASIVE CV LAB;  Service: Cardiovascular;  Laterality: N/A;   pacemasker     PROSTATE SURGERY     RIGHT/LEFT HEART CATH AND CORONARY ANGIOGRAPHY Bilateral 11/25/2022   Procedure: RIGHT/LEFT HEART CATH AND CORONARY ANGIOGRAPHY;  Surgeon: Darron Deatrice LABOR, MD;  Location: [facility] INVASIVE CV LAB;  Service: Cardiovascular;  Laterality: Bilateral;   SQUAMOUS CELL CARCINOMA EXCISION     face, nose left shoulder, left arm   TONSILLECTOMY AND ADENOIDECTOMY     Patient Active Problem List   Diagnosis Date Noted   Angina pectoris 11/10/2023   Weakness of left foot 10/29/2023   CKD stage 3b, GFR 30-44 ml/min (HCC) 10/29/2023   Obstructive sleep apnea 10/29/2023   Combined systolic and diastolic congestive heart failure (HCC) 10/29/2023   AKI (acute kidney injury) 04/29/2023   UTI (urinary tract infection) 04/29/2023   Chronic systolic heart failure (HCC) 11/25/2022   Sick sinus syndrome (HCC) 02/26/2022   Closed fracture of second lumbar vertebra (HCC) 09/26/2021   Dysphagia, unspecified 09/26/2021   Heart block 09/26/2021   Heartburn 09/26/2021   History of lumbar fusion 09/26/2021   Inflammation of sacroiliac  joint 09/26/2021   Other symptoms and signs involving cognitive functions and awareness 09/26/2021   Prediabetes 09/26/2021   Low back pain 09/26/2021   Renal cell carcinoma (HCC) 09/26/2021   Essential hypertension 09/26/2021   Cerebrovascular disease 09/26/2021   Transient cerebral ischemic attack, unspecified 09/26/2021   Cerebrovascular disease, unspecified 09/26/2021   Transient ischemic attack 09/26/2021   Unstable angina (HCC)    Lumbar burst fracture (HCC) 05/04/2020   Hypogonadism male 10/10/2019   Persistent atrial fibrillation (HCC) 01/12/2019   Acute low back pain 11/04/2017   Abnormal screening cardiac CT    Visit for monitoring Tikosyn  therapy 09/09/2017   History of TIA (transient ischemic attack) 08/29/2016  Paroxysmal atrial fibrillation (HCC) 07/23/2016   CAD (coronary artery disease) 07/23/2016   Snores 06/27/2016   S/P placement of cardiac pacemaker 06/25/2016   Hemiparesis (HCC) 06/25/2016   Acute ischemic stroke (HCC)    Left-sided weakness    Hemisensory loss    Dysarthria    Stroke-like symptoms    Complete heart block (HCC)    Pain in thoracic spine    Orthostasis    Lethargy    Vertebral artery occlusion, left    TIA (transient ischemic attack) 06/14/2016   Arthritis 02/06/2015   Esophageal reflux 02/06/2015   Arthritis urica 02/06/2015   Cannot sleep 02/06/2015   Arthritis, degenerative 02/06/2015   Adenocarcinoma, renal cell (HCC) 02/06/2015   Benign prostatic hyperplasia with lower urinary tract symptoms 11/21/2014   Personal history of other malignant neoplasm of kidney 11/21/2014   History of Lyme disease 05/06/2014   Palpitations 12/31/2013   Hyperlipidemia 11/02/2010   HYPERTENSION, BENIGN 04/16/2010   ONSET DATE: 11/10/23  REFERRING DIAG: CVA  THERAPY DIAG:  Muscle weakness (generalized)  Other lack of coordination  Rationale for Evaluation and Treatment: Rehabilitation  SUBJECTIVE:  SUBJECTIVE STATEMENT: Pt reports that his  doctor last week started the process for pt to get imaging for his back, but pt reports nothing has been scheduled yet.  Pt accompanied by: self and significant other  PERTINENT HISTORY: 89 year old male who was admitted to hospital on 11/10/23 for management of unstable angina after pt developed clumsiness of the left arm after cardiac catheterization 7/7, workup for TIA. Per neurology note symptoms secondary to relative cerebral hypoperfusion in the setting of heart disease and right carotid artery and M1 stenosis. PMhx: PAF on Eliquis , HTN, HLD, prediabetes, OSA on CPAP, dominant left vertebral artery occlusion, TIA, SSS s/p PPM, BPH, renal CA, lumbar fusion, A-fib.  PRECAUTIONS: None  WEIGHT BEARING RESTRICTIONS: No  PAIN: 03/01/24: 5/10 thoracic spine Are you having pain? 4/10  back pain  FALLS: Has patient fallen in last 6 months? Yes. Number of falls 4  LIVING ENVIRONMENT: Lives with: lives with their spouse Lives in: House/apartment Stairs: Yes: Internal: 2 steps; none Has following equipment at home: Single point cane and Walker - 2 wheeled  PLOF: Independent with basic ADLs  PATIENT GOALS: to go to [facility] for more detailed neuro testing and return to function in LUE  OBJECTIVE:  Note: Objective measures were completed at Evaluation unless otherwise noted.  HAND DOMINANCE: Right  ADLs: Overall ADLs: MIN A Transfers/ambulation related to ADLs: Eating: IND - uses dominant R Grooming: SETUP UB Dressing: SETUP - buttons assist LB Dressing: MIN - hiking pants  Toileting: IND Bathing: IND Tub Shower transfers: IND Equipment: none  IADLs: Shopping: spouse completes as needed Light housekeeping: spouse completes as needed Meal Prep: assist from spouse Community mobility: SPC/RW as needed Medication management: IND Landscape architect: IND Handwriting: 100% legible R hand  MOBILITY STATUS: Independent and Hx of falls  POSTURE COMMENTS:  rounded shoulders and  forward head Sitting balance: Sits without UE support up to 30 sec  ACTIVITY TOLERANCE: Activity tolerance: IND - tolerates 20 min standing activities   FUNCTIONAL OUTCOME MEASURES:    UPPER EXTREMITY ROM:   BUE AROM WFL   UPPER EXTREMITY MMT:     MMT Right eval Left eval  Shoulder flexion    Shoulder abduction    Shoulder adduction    Shoulder extension    Shoulder internal rotation    Shoulder external rotation    Middle trapezius  Lower trapezius    Elbow flexion    Elbow extension    Wrist flexion    Wrist extension    Wrist ulnar deviation    Wrist radial deviation    Wrist pronation    Wrist supination    (Blank rows = not tested)  HAND FUNCTION: Grip strength: Right: 76 lbs; Left: 55 lbs, Lateral pinch: Right: 15 lbs, Left: 11 lbs, and 3 point pinch: Right: 21 lbs, Left: 14 lbs  COORDINATION: 9 Hole Peg test: Right: 26 sec; Left: 10 sec  SENSATION:  12/17/23 Light touch: decreased sensation and numbness in L hand 5th digit Stereognosis:   Hot/Cold:   Proprioception: intact  EDEMA: mild swelling to L hand  COGNITION: Overall cognitive status: Within functional limits for tasks assessed  VISION: Subjective report: baseline Baseline vision: No visual deficits  VISION ASSESSMENT: WFL                                                                                                 TREATMENT DATE: 03/01/24  Moist heat applied to T-spine for pain management throughout session  Therapeutic Activity: -Facilitated LUE GMC/FMC and grip strengthening working to place jumbo pegs into pegboard which was placed on an incline, and removing pegs with hand gripper set at 17.9# for 1 trial, and 23.4# for 2 more trials. -Facilitated LUE pinch strengthening using lateral and 3 point pinch patterns to secure, rotate, and pull apart PVP pipes, all pieces measuring 2 cm in diameter.   -Facilitated L forearm, wrist, and hand strengthening with participation in EZ  board tools.  Pt worked with small and large base key turn and small and large dial turn, working with larger pieces on wide strip of velcro, and smaller pieces on narrow strip of velcro.  Pt required intermittent min A to maintain tools level on board while rotating tools against velcro resistance.    PATIENT EDUCATION: Education details: Pain management strategies Person educated: Patient Education method: Explanation and Verbal cues Education comprehension: verbalized understanding  HOME EXERCISE PROGRAM: Blue theraputty [facility] - coins at tabletop  GOALS: Goals reviewed with patient? Yes  SHORT TERM GOALS: Target date: 01/26/24  Pt will IND complete HEP for functional strenghening of non-dominant LUE. Baseline: not initiated Goal status: INITIAL   LONG TERM GOALS: Target date: 03/08/24  Pt will increase L pinch strength by 5 # of force to securely hold a wrench during leisure activities. Baseline: L pinch 14# Goal status: INITIAL  2.  Pt will increase L grip strength by 15# to wash dishes without dropping items.  Baseline: L grip 55# Goal status: INITIAL  3.  Pt will improve LUE coordination by 30 seconds to independently manipulate fasteners during dressing tasks. Baseline: L 1'10.  Goal status: INITIAL  4.  Pt will improve dynamic seated balance to don B shoes independently on edge of bed without loss of balance. Baseline: requires chair with armrest to don shoes with standby assist Goal status: INITIAL   ASSESSMENT: CLINICAL IMPRESSION: Pt continues to report challenges with maintaining L grip and pinch prehension during ADL tasks, such  as pulling his belt or hiking his pants.  Pt tolerated all activities well today, requiring occasional min A to loosen PVP pipes or to stabilize EZ board tools during rotation against narrow and wide strips of velcro.  Back pain continues to be managed well during session with use of moist heat to thoracic and lumbar spine.  Pt. continues  to benefit from OT services to work on L grip and lateral pinch strength, L FMC skills, and dynamic sitting balance to improve functional independence of ADL/IADL tasks at home.   PERFORMANCE DEFICITS: in functional skills including ADLs, IADLs, coordination, dexterity, sensation, and edema and psychosocial skills including environmental adaptation.   IMPAIRMENTS: are limiting patient from ADLs, IADLs, and leisure.   CO-MORBIDITIES: may have co-morbidities  that affects occupational performance. Patient will benefit from skilled OT to address above impairments and improve overall function.  MODIFICATION OR ASSISTANCE TO COMPLETE EVALUATION: No modification of tasks or assist necessary to complete an evaluation.  OT OCCUPATIONAL PROFILE AND HISTORY: Problem focused assessment: Including review of records relating to presenting problem.  CLINICAL DECISION MAKING: LOW - limited treatment options, no task modification necessary  REHAB POTENTIAL: Good  EVALUATION COMPLEXITY: Moderate    PLAN:  OT FREQUENCY: 1-2x/week  OT DURATION: 12 weeks  PLANNED INTERVENTIONS: 97168 OT Re-evaluation, 97535 self care/ADL training, 02889 therapeutic exercise, 97530 therapeutic activity, and 97112 neuromuscular re-education  RECOMMENDED OTHER SERVICES: PT  CONSULTED AND AGREED WITH PLAN OF CARE: Patient and family member/caregiver  PLAN FOR NEXT SESSION: see above  Inocente Blazing, MS, OTR/L  03/01/2024, 4:12 PM

## 2024-03-03 ENCOUNTER — Ambulatory Visit

## 2024-03-03 ENCOUNTER — Other Ambulatory Visit: Payer: Self-pay

## 2024-03-03 DIAGNOSIS — R278 Other lack of coordination: Secondary | ICD-10-CM

## 2024-03-03 DIAGNOSIS — M6281 Muscle weakness (generalized): Secondary | ICD-10-CM

## 2024-03-03 DIAGNOSIS — S32029G Unspecified fracture of second lumbar vertebra, subsequent encounter for fracture with delayed healing: Secondary | ICD-10-CM | POA: Diagnosis not present

## 2024-03-03 NOTE — Therapy (Signed)
 OUTPATIENT OCCUPATIONAL THERAPY NEURO TREATMENT NOTE  Patient Name: Thomas [name] MRN: [id number] DOB:10-14-1932, 89 y.o., male Today's Date: 03/03/2024  PCP: Charlie Forte REFERRING PROVIDER: [name]  END OF SESSION:  OT End of Session - 03/03/24 1206     Visit Number 9    Number of Visits 24    Date for Recertification  03/08/24    OT Start Time 1200    OT Stop Time 1239    OT Time Calculation (min) 39 min    Activity Tolerance Patient tolerated treatment well    Behavior During Therapy [facility] for tasks assessed/performed         Past Medical History:  Diagnosis Date   Aortic insufficiency    a. 07/2023 Echo: Mod AI; b. 09/2023 Echo: Mild-mod AI.   Aortic valve disorders    Arthritis    Basal cell carcinoma    face, nose left shoulder, left arm (06/19/2016)   Basal cell carcinoma 09/13/2020   right temple   BBB (bundle branch block)    hx right   CAD (coronary artery disease)    a. 10/2017 Cath: mod, nonobs dzs; b. 02/2021 Cath: mod, nonobs dzs; c. 11/2022 Cath: LAD 30ost/p, 17m, D1 min irregs, LCX 65p/m, OM1 min irregs, OM2 40, OM3 nl, RCA 30p, 85m, 40d w/ 40 in side branch-->Med rx; d. 11/2023 PCI: LAD 90ost/p (3.5x15 Onyx Frontier DES). Otw stable anatomy.   Chronic back pain    neck, thoracic, lower back (06/19/2016)   CKD (chronic kidney disease), stage III (HCC)    Complete heart block (HCC) 06/2016   a. 06/2016 s/p MDT PPM; b. 01/2023 s/p upgrade to MDT CRT-P (ser # [id number]).   GERD (gastroesophageal reflux disease)    Gout    Heart block    I've had type I, II Wenke before now (06/19/2016)   History of gout    History of hiatal hernia    self dx'd (06/19/2016)   Hyperlipidemia    Hypertension    Lyme disease    dx'd by me 2003; cx's showed dx 08/2015   Migraine    3-4/year (06/19/2016)   Mitral regurgitation    a. 07/2023 Echo: Mod MR.   NICM (nonischemic cardiomyopathy - in setting of RV pacing) (HCC)    a. 02/2019 Echo: EF 45-50%; b.  10/2022 Echo: EF 30-35%; c. 01/2023 CRT-P upgrade; e. 07/2023 Echo: EF 40-45%, glob HK; f. 10/2023 Echo: EF of 55 to 60% with grade 1 DD, nl RV fxn, triv MR, mild to mod AI, and mildly dilated ascending aorta at 42 mm.   PAF (paroxysmal atrial fibrillation) (HCC)    a. CHA2DS2VASc = 7--> eliquis ; b. On tikosyn .   Paroxysmal atrial flutter (HCC)    Presence of permanent cardiac pacemaker    PVC's (premature ventricular contractions)    Renal cancer, left (HCC) 2006   S/P cryotherapy   Spinal stenosis    cervical, 1 thoracic, lumbar (06/19/2016)   Squamous carcinoma    face, nose left shoulder, left arm (06/19/2016)   Stroke (HCC)    TIA (transient ischemic attack) 06/14/2016   I'm not sure that's what it was (06/25/2016)   Visit for monitoring Tikosyn  therapy 09/09/2017   Past Surgical History:  Procedure Laterality Date   ANKLE FRACTURE SURGERY Right 1967   BACK SURGERY  05/07/2020   BASAL CELL CARCINOMA EXCISION     face, nose left shoulder, left arm   BIOPSY PROSTATE  2001 & 2003   BIV  UPGRADE N/A 01/10/2023   Procedure: BIV UPGRADE;  Surgeon: Fernande Elspeth BROCKS, MD;  Location: [facility] INVASIVE CV LAB;  Service: Cardiovascular;  Laterality: N/A;   CARDIAC CATHETERIZATION  1990's   CARDIOVERSION N/A 09/11/2017   Procedure: CARDIOVERSION;  Surgeon: Pietro Redell RAMAN, MD;  Location: [facility] ENDOSCOPY;  Service: Cardiovascular;  Laterality: N/A;   CORONARY STENT INTERVENTION N/A 11/10/2023   Procedure: CORONARY STENT INTERVENTION;  Surgeon: Darron Deatrice LABOR, MD;  Location: [facility] INVASIVE CV LAB;  Service: Cardiovascular;  Laterality: N/A;   FRACTURE SURGERY     HOLEP-LASER ENUCLEATION OF THE PROSTATE WITH MORCELLATION N/A 07/10/2020   Procedure: HOLEP-LASER ENUCLEATION OF THE PROSTATE WITH MORCELLATION;  Surgeon: Penne Knee, MD;  Location: [facility] ORS;  Service: Urology;  Laterality: N/A;   INGUINAL HERNIA REPAIR Left 2012   INSERT / REPLACE / REMOVE PACEMAKER  06/19/2016   LAPAROSCOPIC ABLATION  RENAL MASS     LEFT HEART CATH AND CORONARY ANGIOGRAPHY Left 10/23/2017   Procedure: LEFT HEART CATH AND CORONARY ANGIOGRAPHY;  Surgeon: Darron Deatrice LABOR, MD;  Location: [facility] INVASIVE CV LAB;  Service: Cardiovascular;  Laterality: Left;   LEFT HEART CATH AND CORONARY ANGIOGRAPHY Left 02/26/2021   Procedure: LEFT HEART CATH AND CORONARY ANGIOGRAPHY;  Surgeon: Darron Deatrice LABOR, MD;  Location: [facility] INVASIVE CV LAB;  Service: Cardiovascular;  Laterality: Left;   LEFT HEART CATH AND CORONARY ANGIOGRAPHY Left 11/10/2023   Procedure: LEFT HEART CATH AND CORONARY ANGIOGRAPHY;  Surgeon: Darron Deatrice LABOR, MD;  Location: [facility] INVASIVE CV LAB;  Service: Cardiovascular;  Laterality: Left;   PACEMAKER IMPLANT N/A 06/19/2016   Procedure: Pacemaker Implant;  Surgeon: Elspeth BROCKS Fernande, MD;  Location: [facility] INVASIVE CV LAB;  Service: Cardiovascular;  Laterality: N/A;   pacemasker     PROSTATE SURGERY     RIGHT/LEFT HEART CATH AND CORONARY ANGIOGRAPHY Bilateral 11/25/2022   Procedure: RIGHT/LEFT HEART CATH AND CORONARY ANGIOGRAPHY;  Surgeon: Darron Deatrice LABOR, MD;  Location: [facility] INVASIVE CV LAB;  Service: Cardiovascular;  Laterality: Bilateral;   SQUAMOUS CELL CARCINOMA EXCISION     face, nose left shoulder, left arm   TONSILLECTOMY AND ADENOIDECTOMY     Patient Active Problem List   Diagnosis Date Noted   Angina pectoris 11/10/2023   Weakness of left foot 10/29/2023   CKD stage 3b, GFR 30-44 ml/min (HCC) 10/29/2023   Obstructive sleep apnea 10/29/2023   Combined systolic and diastolic congestive heart failure (HCC) 10/29/2023   AKI (acute kidney injury) 04/29/2023   UTI (urinary tract infection) 04/29/2023   Chronic systolic heart failure (HCC) 11/25/2022   Sick sinus syndrome (HCC) 02/26/2022   Closed fracture of second lumbar vertebra (HCC) 09/26/2021   Dysphagia, unspecified 09/26/2021   Heart block 09/26/2021   Heartburn 09/26/2021   History of lumbar fusion 09/26/2021   Inflammation of sacroiliac  joint 09/26/2021   Other symptoms and signs involving cognitive functions and awareness 09/26/2021   Prediabetes 09/26/2021   Low back pain 09/26/2021   Renal cell carcinoma (HCC) 09/26/2021   Essential hypertension 09/26/2021   Cerebrovascular disease 09/26/2021   Transient cerebral ischemic attack, unspecified 09/26/2021   Cerebrovascular disease, unspecified 09/26/2021   Transient ischemic attack 09/26/2021   Unstable angina (HCC)    Lumbar burst fracture (HCC) 05/04/2020   Hypogonadism male 10/10/2019   Persistent atrial fibrillation (HCC) 01/12/2019   Acute low back pain 11/04/2017   Abnormal screening cardiac CT    Visit for monitoring Tikosyn  therapy 09/09/2017   History of TIA (transient ischemic attack) 08/29/2016  Paroxysmal atrial fibrillation (HCC) 07/23/2016   CAD (coronary artery disease) 07/23/2016   Snores 06/27/2016   S/P placement of cardiac pacemaker 06/25/2016   Hemiparesis (HCC) 06/25/2016   Acute ischemic stroke (HCC)    Left-sided weakness    Hemisensory loss    Dysarthria    Stroke-like symptoms    Complete heart block (HCC)    Pain in thoracic spine    Orthostasis    Lethargy    Vertebral artery occlusion, left    TIA (transient ischemic attack) 06/14/2016   Arthritis 02/06/2015   Esophageal reflux 02/06/2015   Arthritis urica 02/06/2015   Cannot sleep 02/06/2015   Arthritis, degenerative 02/06/2015   Adenocarcinoma, renal cell (HCC) 02/06/2015   Benign prostatic hyperplasia with lower urinary tract symptoms 11/21/2014   Personal history of other malignant neoplasm of kidney 11/21/2014   History of Lyme disease 05/06/2014   Palpitations 12/31/2013   Hyperlipidemia 11/02/2010   HYPERTENSION, BENIGN 04/16/2010   ONSET DATE: 11/10/23  REFERRING DIAG: CVA  THERAPY DIAG:  Muscle weakness (generalized)  Other lack of coordination  Rationale for Evaluation and Treatment: Rehabilitation  SUBJECTIVE:  SUBJECTIVE STATEMENT: Pt reports that he  has a neurology appt for his back this afternoon.   Pt accompanied by: self and significant other  PERTINENT HISTORY: 89 year old male who was admitted to hospital on 11/10/23 for management of unstable angina after pt developed clumsiness of the left arm after cardiac catheterization 7/7, workup for TIA. Per neurology note symptoms secondary to relative cerebral hypoperfusion in the setting of heart disease and right carotid artery and M1 stenosis. PMhx: PAF on Eliquis , HTN, HLD, prediabetes, OSA on CPAP, dominant left vertebral artery occlusion, TIA, SSS s/p PPM, BPH, renal CA, lumbar fusion, A-fib.  PRECAUTIONS: None  WEIGHT BEARING RESTRICTIONS: No  PAIN: 03/03/24: 2/10 thoracic spine Are you having pain? 4/10  back pain  FALLS: Has patient fallen in last 6 months? Yes. Number of falls 4  LIVING ENVIRONMENT: Lives with: lives with their spouse Lives in: House/apartment Stairs: Yes: Internal: 2 steps; none Has following equipment at home: Single point cane and Walker - 2 wheeled  PLOF: Independent with basic ADLs  PATIENT GOALS: to go to [facility] for more detailed neuro testing and return to function in LUE  OBJECTIVE:  Note: Objective measures were completed at Evaluation unless otherwise noted.  HAND DOMINANCE: Right  ADLs: Overall ADLs: MIN A Transfers/ambulation related to ADLs: Eating: IND - uses dominant R Grooming: SETUP UB Dressing: SETUP - buttons assist LB Dressing: MIN - hiking pants  Toileting: IND Bathing: IND Tub Shower transfers: IND Equipment: none  IADLs: Shopping: spouse completes as needed Light housekeeping: spouse completes as needed Meal Prep: assist from spouse Community mobility: SPC/RW as needed Medication management: IND Landscape architect: IND Handwriting: 100% legible R hand  MOBILITY STATUS: Independent and Hx of falls  POSTURE COMMENTS:  rounded shoulders and forward head Sitting balance: Sits without UE support up to 30  sec  ACTIVITY TOLERANCE: Activity tolerance: IND - tolerates 20 min standing activities   FUNCTIONAL OUTCOME MEASURES:    UPPER EXTREMITY ROM:   BUE AROM WFL   UPPER EXTREMITY MMT:     MMT Right eval Left eval  Shoulder flexion    Shoulder abduction    Shoulder adduction    Shoulder extension    Shoulder internal rotation    Shoulder external rotation    Middle trapezius    Lower trapezius    Elbow flexion  Elbow extension    Wrist flexion    Wrist extension    Wrist ulnar deviation    Wrist radial deviation    Wrist pronation    Wrist supination    (Blank rows = not tested)  HAND FUNCTION: Grip strength: Right: 76 lbs; Left: 55 lbs, Lateral pinch: Right: 15 lbs, Left: 11 lbs, and 3 point pinch: Right: 21 lbs, Left: 14 lbs  COORDINATION: 9 Hole Peg test: Right: 26 sec; Left: 10 sec  SENSATION:  12/17/23 Light touch: decreased sensation and numbness in L hand 5th digit Stereognosis:   Hot/Cold:   Proprioception: intact  EDEMA: mild swelling to L hand  COGNITION: Overall cognitive status: Within functional limits for tasks assessed  VISION: Subjective report: baseline Baseline vision: No visual deficits  VISION ASSESSMENT: WFL                                                                                                 TREATMENT DATE: 03/03/24  Moist heat applied to T-spine for pain management throughout session  Neuro re-ed: -Facilitated L hand FMC/dexterity skills working on manipulation of grooved pegs.  Pt practiced picking pegs up from dish and table top, rotating pegs from horizontal to vertical position, rotating between fingertips to place pegs into grooves, storing multiple in hand, then moving pegs 1 by 1 from palm to fingertips in prep for discarding.  Therapeutic Exercise: -Facilitated L hand lateral and 3 point pinch strengthening working with red, green, and blue resistant clips, moving clips on/off a dowel.  Min vc for engaging L  LF into 3 point pinch pattern.  -Lumbrical strengthening: sustained grasp of a book, piece of paper, requiring min A to block L 4th and 5th digit PIPs to prevent flexion.  Demonstrated reps of active ROM to formulate lumbrical grasp alternated by digit ext, and isolating 4th and 5th digit only to practice opposition to thumb with fully extended PIP joint.   -Attempted resistance with use of Can-Do digit exerciser to perform lumbrical strengthening, requiring mod A to block 4th and 5th digit PIPs into extension.  Self Care: -Issued size 10 oval 8 splint to use for therapeutic exercises only, allowing pt to block 4th or 5th digit PIPs into full extension when strengthening lumbricals with theraputty.  Pt verbalized understanding of recommendation to doff finger splint after exercise.   PATIENT EDUCATION: Education details: use of oval 8 splint Person educated: Patient Education method: Explanation and Verbal cues Education comprehension: verbalized understanding  HOME EXERCISE PROGRAM: Blue theraputty [facility] - coins at tabletop  GOALS: Goals reviewed with patient? Yes  SHORT TERM GOALS: Target date: 01/26/24  Pt will IND complete HEP for functional strenghening of non-dominant LUE. Baseline: not initiated Goal status: INITIAL   LONG TERM GOALS: Target date: 03/08/24  Pt will increase L pinch strength by 5 # of force to securely hold a wrench during leisure activities. Baseline: L pinch 14# Goal status: INITIAL  2.  Pt will increase L grip strength by 15# to wash dishes without dropping items.  Baseline: L grip 55# Goal status: INITIAL  3.  Pt will improve LUE coordination by 30 seconds to independently manipulate fasteners during dressing tasks. Baseline: L 1'10.  Goal status: INITIAL  4.  Pt will improve dynamic seated balance to don B shoes independently on edge of bed without loss of balance. Baseline: requires chair with armrest to don shoes with standby assist Goal status:  INITIAL  ASSESSMENT: CLINICAL IMPRESSION: Pt reports that digits seem to buckle with various grasp/pinch patterns.  OT instructed pt in adding lumbrical strengthening to HEP to prevent buckling at the PIPs of digits 4 and 5 on the L hand.  Pt verbalized understanding of instruction to doff splint after exercise, as this is not needed throughout the day.  OT issued visual handout for lumbrical strengthening using theraputty, with pt showing good understanding of this addition.  Pt continues to present with L hand weakness, noting difficulty managing blue clips today with both lateral and 3 point pinch formations, and more consistently able to manage red and green with occasional slipping of thumb when using a lateral pinch, or slipping of the 3rd digit during a 3 point pinch pattern.  Translatory movements of small objects within palm remains difficult d/t decreased sensation in the hand, further impacted by weakness in the digits.  Pt. continues to benefit from OT services to work on L grip and lateral pinch strength, L FMC skills, and dynamic sitting balance to improve functional independence of ADL/IADL tasks at home.   PERFORMANCE DEFICITS: in functional skills including ADLs, IADLs, coordination, dexterity, sensation, and edema and psychosocial skills including environmental adaptation.   IMPAIRMENTS: are limiting patient from ADLs, IADLs, and leisure.   CO-MORBIDITIES: may have co-morbidities  that affects occupational performance. Patient will benefit from skilled OT to address above impairments and improve overall function.  MODIFICATION OR ASSISTANCE TO COMPLETE EVALUATION: No modification of tasks or assist necessary to complete an evaluation.  OT OCCUPATIONAL PROFILE AND HISTORY: Problem focused assessment: Including review of records relating to presenting problem.  CLINICAL DECISION MAKING: LOW - limited treatment options, no task modification necessary  REHAB POTENTIAL:  Good  EVALUATION COMPLEXITY: Moderate    PLAN:  OT FREQUENCY: 1-2x/week  OT DURATION: 12 weeks  PLANNED INTERVENTIONS: 97168 OT Re-evaluation, 97535 self care/ADL training, 02889 therapeutic exercise, 97530 therapeutic activity, and 97112 neuromuscular re-education  RECOMMENDED OTHER SERVICES: PT  CONSULTED AND AGREED WITH PLAN OF CARE: Patient and family member/caregiver  PLAN FOR NEXT SESSION: see above  Inocente Blazing, MS, OTR/L  03/03/2024, 1:20 PM

## 2024-03-08 ENCOUNTER — Ambulatory Visit: Admitting: Physical Therapy

## 2024-03-08 ENCOUNTER — Ambulatory Visit: Attending: Cardiovascular Disease

## 2024-03-08 DIAGNOSIS — M6281 Muscle weakness (generalized): Secondary | ICD-10-CM | POA: Diagnosis present

## 2024-03-08 DIAGNOSIS — R278 Other lack of coordination: Secondary | ICD-10-CM | POA: Insufficient documentation

## 2024-03-08 NOTE — Therapy (Unsigned)
 OUTPATIENT OCCUPATIONAL THERAPY NEURO TREATMENT NOTE  Patient Name: Thomas [name] MRN: [id number] DOB:November 18, 1932, 89 y.o., male Today's Date: 03/08/2024  PCP: Charlie Forte REFERRING PROVIDER: [name]  END OF SESSION:   Past Medical History:  Diagnosis Date   Aortic insufficiency    a. 07/2023 Echo: Mod AI; b. 09/2023 Echo: Mild-mod AI.   Aortic valve disorders    Arthritis    Basal cell carcinoma    face, nose left shoulder, left arm (06/19/2016)   Basal cell carcinoma 09/13/2020   right temple   BBB (bundle branch block)    hx right   CAD (coronary artery disease)    a. 10/2017 Cath: mod, nonobs dzs; b. 02/2021 Cath: mod, nonobs dzs; c. 11/2022 Cath: LAD 30ost/p, 59m, D1 min irregs, LCX 65p/m, OM1 min irregs, OM2 40, OM3 nl, RCA 30p, 78m, 40d w/ 40 in side branch-->Med rx; d. 11/2023 PCI: LAD 90ost/p (3.5x15 Onyx Frontier DES). Otw stable anatomy.   Chronic back pain    neck, thoracic, lower back (06/19/2016)   CKD (chronic kidney disease), stage III (HCC)    Complete heart block (HCC) 06/2016   a. 06/2016 s/p MDT PPM; b. 01/2023 s/p upgrade to MDT CRT-P (ser # [id number]).   GERD (gastroesophageal reflux disease)    Gout    Heart block    I've had type I, II Wenke before now (06/19/2016)   History of gout    History of hiatal hernia    self dx'd (06/19/2016)   Hyperlipidemia    Hypertension    Lyme disease    dx'd by me 2003; cx's showed dx 08/2015   Migraine    3-4/year (06/19/2016)   Mitral regurgitation    a. 07/2023 Echo: Mod MR.   NICM (nonischemic cardiomyopathy - in setting of RV pacing) (HCC)    a. 02/2019 Echo: EF 45-50%; b. 10/2022 Echo: EF 30-35%; c. 01/2023 CRT-P upgrade; e. 07/2023 Echo: EF 40-45%, glob HK; f. 10/2023 Echo: EF of 55 to 60% with grade 1 DD, nl RV fxn, triv MR, mild to mod AI, and mildly dilated ascending aorta at 42 mm.   PAF (paroxysmal atrial fibrillation) (HCC)    a. CHA2DS2VASc = 7--> eliquis ; b. On tikosyn .   Paroxysmal atrial  flutter (HCC)    Presence of permanent cardiac pacemaker    PVC's (premature ventricular contractions)    Renal cancer, left (HCC) 2006   S/P cryotherapy   Spinal stenosis    cervical, 1 thoracic, lumbar (06/19/2016)   Squamous carcinoma    face, nose left shoulder, left arm (06/19/2016)   Stroke (HCC)    TIA (transient ischemic attack) 06/14/2016   I'm not sure that's what it was (06/25/2016)   Visit for monitoring Tikosyn  therapy 09/09/2017   Past Surgical History:  Procedure Laterality Date   ANKLE FRACTURE SURGERY Right 1967   BACK SURGERY  05/07/2020   BASAL CELL CARCINOMA EXCISION     face, nose left shoulder, left arm   BIOPSY PROSTATE  2001 & 2003   BIV UPGRADE N/A 01/10/2023   Procedure: BIV UPGRADE;  Surgeon: Fernande Elspeth BROCKS, MD;  Location: [facility];  Service: Cardiovascular;  Laterality: N/A;   CARDIAC CATHETERIZATION  1990's   CARDIOVERSION N/A 09/11/2017   Procedure: CARDIOVERSION;  Surgeon: Pietro Redell GORMAN, MD;  Location: [facility];  Service: Cardiovascular;  Laterality: N/A;   CORONARY STENT INTERVENTION N/A 11/10/2023   Procedure: CORONARY STENT INTERVENTION;  Surgeon: Cage Deatrice LABOR, MD;  Location:  [facility];  Service: Cardiovascular;  Laterality: N/A;   FRACTURE SURGERY     HOLEP-LASER ENUCLEATION OF THE PROSTATE WITH MORCELLATION N/A 07/10/2020   Procedure: HOLEP-LASER ENUCLEATION OF THE PROSTATE WITH MORCELLATION;  Surgeon: Penne Knee, MD;  Location: [facility];  Service: Urology;  Laterality: N/A;   INGUINAL HERNIA REPAIR Left 2012   INSERT / REPLACE / REMOVE PACEMAKER  06/19/2016   LAPAROSCOPIC ABLATION RENAL MASS     LEFT HEART CATH AND CORONARY ANGIOGRAPHY Left 10/23/2017   Procedure: LEFT HEART CATH AND CORONARY ANGIOGRAPHY;  Surgeon: Darron Deatrice LABOR, MD;  Location: [facility];  Service: Cardiovascular;  Laterality: Left;   LEFT HEART CATH AND CORONARY ANGIOGRAPHY Left 02/26/2021   Procedure: LEFT HEART CATH AND  CORONARY ANGIOGRAPHY;  Surgeon: Darron Deatrice LABOR, MD;  Location: [facility];  Service: Cardiovascular;  Laterality: Left;   LEFT HEART CATH AND CORONARY ANGIOGRAPHY Left 11/10/2023   Procedure: LEFT HEART CATH AND CORONARY ANGIOGRAPHY;  Surgeon: Darron Deatrice LABOR, MD;  Location: [facility];  Service: Cardiovascular;  Laterality: Left;   PACEMAKER IMPLANT N/A 06/19/2016   Procedure: Pacemaker Implant;  Surgeon: Elspeth JAYSON Sage, MD;  Location: [facility] INVASIVE CV LAB;  Service: Cardiovascular;  Laterality: N/A;   pacemasker     PROSTATE SURGERY     RIGHT/LEFT HEART CATH AND CORONARY ANGIOGRAPHY Bilateral 11/25/2022   Procedure: RIGHT/LEFT HEART CATH AND CORONARY ANGIOGRAPHY;  Surgeon: Darron Deatrice LABOR, MD;  Location: [facility];  Service: Cardiovascular;  Laterality: Bilateral;   SQUAMOUS CELL CARCINOMA EXCISION     face, nose left shoulder, left arm   TONSILLECTOMY AND ADENOIDECTOMY     Patient Active Problem List   Diagnosis Date Noted   Angina pectoris 11/10/2023   Weakness of left foot 10/29/2023   CKD stage 3b, GFR 30-44 ml/min (HCC) 10/29/2023   Obstructive sleep apnea 10/29/2023   Combined systolic and diastolic congestive heart failure (HCC) 10/29/2023   AKI (acute kidney injury) 04/29/2023   UTI (urinary tract infection) 04/29/2023   Chronic systolic heart failure (HCC) 11/25/2022   Sick sinus syndrome (HCC) 02/26/2022   Closed fracture of second lumbar vertebra (HCC) 09/26/2021   Dysphagia, unspecified 09/26/2021   Heart block 09/26/2021   Heartburn 09/26/2021   History of lumbar fusion 09/26/2021   Inflammation of sacroiliac joint 09/26/2021   Other symptoms and signs involving cognitive functions and awareness 09/26/2021   Prediabetes 09/26/2021   Low back pain 09/26/2021   Renal cell carcinoma (HCC) 09/26/2021   Essential hypertension 09/26/2021   Cerebrovascular disease 09/26/2021   Transient cerebral ischemic attack, unspecified 09/26/2021    Cerebrovascular disease, unspecified 09/26/2021   Transient ischemic attack 09/26/2021   Unstable angina (HCC)    Lumbar burst fracture (HCC) 05/04/2020   Hypogonadism male 10/10/2019   Persistent atrial fibrillation (HCC) 01/12/2019   Acute low back pain 11/04/2017   Abnormal screening cardiac CT    Visit for monitoring Tikosyn  therapy 09/09/2017   History of TIA (transient ischemic attack) 08/29/2016   Paroxysmal atrial fibrillation (HCC) 07/23/2016   CAD (coronary artery disease) 07/23/2016   Snores 06/27/2016   S/P placement of cardiac pacemaker 06/25/2016   Hemiparesis (HCC) 06/25/2016   Acute ischemic stroke (HCC)    Left-sided weakness    Hemisensory loss    Dysarthria    Stroke-like symptoms    Complete heart block (HCC)    Pain in thoracic spine    Orthostasis    Lethargy  Vertebral artery occlusion, left    TIA (transient ischemic attack) 06/14/2016   Arthritis 02/06/2015   Esophageal reflux 02/06/2015   Arthritis urica 02/06/2015   Cannot sleep 02/06/2015   Arthritis, degenerative 02/06/2015   Adenocarcinoma, renal cell (HCC) 02/06/2015   Benign prostatic hyperplasia with lower urinary tract symptoms 11/21/2014   Personal history of other malignant neoplasm of kidney 11/21/2014   History of Lyme disease 05/06/2014   Palpitations 12/31/2013   Hyperlipidemia 11/02/2010   HYPERTENSION, BENIGN 04/16/2010   ONSET DATE: 11/10/23  REFERRING DIAG: CVA  THERAPY DIAG:  No diagnosis found.  Rationale for Evaluation and Treatment: Rehabilitation  SUBJECTIVE:  SUBJECTIVE STATEMENT: CT this week.  Pt accompanied by: self and significant other  PERTINENT HISTORY: 89 year old male who was admitted to hospital on 11/10/23 for management of unstable angina after pt developed clumsiness of the left arm after cardiac catheterization 7/7, workup for TIA. Per neurology note symptoms secondary to relative cerebral hypoperfusion in the setting of heart disease and right carotid  artery and M1 stenosis. PMhx: PAF on Eliquis , HTN, HLD, prediabetes, OSA on CPAP, dominant left vertebral artery occlusion, TIA, SSS s/p PPM, BPH, renal CA, lumbar fusion, A-fib.  PRECAUTIONS: None  WEIGHT BEARING RESTRICTIONS: No  PAIN: 03/08/24: 2/10 thoracic spine Are you having pain? 4/10  back pain  FALLS: Has patient fallen in last 6 months? Yes. Number of falls 4  LIVING ENVIRONMENT: Lives with: lives with their spouse Lives in: House/apartment Stairs: Yes: Internal: 2 steps; none Has following equipment at home: Single point cane and Walker - 2 wheeled  PLOF: Independent with basic ADLs  PATIENT GOALS: to go to [facility] for more detailed neuro testing and return to function in LUE  OBJECTIVE:  Note: Objective measures were completed at Evaluation unless otherwise noted.  HAND DOMINANCE: Right  ADLs: Overall ADLs: MIN A Transfers/ambulation related to ADLs: Eating: IND - uses dominant R Grooming: SETUP UB Dressing: SETUP - buttons assist LB Dressing: MIN - hiking pants  Toileting: IND Bathing: IND Tub Shower transfers: IND Equipment: none  IADLs: Shopping: spouse completes as needed Light housekeeping: spouse completes as needed Meal Prep: assist from spouse Community mobility: SPC/RW as needed Medication management: IND Landscape architect: IND Handwriting: 100% legible R hand  MOBILITY STATUS: Independent and Hx of falls  POSTURE COMMENTS:  rounded shoulders and forward head Sitting balance: Sits without UE support up to 30 sec  ACTIVITY TOLERANCE: Activity tolerance: IND - tolerates 20 min standing activities   FUNCTIONAL OUTCOME MEASURES:    UPPER EXTREMITY ROM:   BUE AROM WFL   UPPER EXTREMITY MMT:     MMT Right eval Left eval  Shoulder flexion    Shoulder abduction    Shoulder adduction    Shoulder extension    Shoulder internal rotation    Shoulder external rotation    Middle trapezius    Lower trapezius    Elbow flexion    Elbow  extension    Wrist flexion    Wrist extension    Wrist ulnar deviation    Wrist radial deviation    Wrist pronation    Wrist supination    (Blank rows = not tested)  HAND FUNCTION: Grip strength: Right: 76 lbs; Left: 55 lbs, Lateral pinch: Right: 15 lbs, Left: 11 lbs, and 3 point pinch: Right: 21 lbs, Left: 14 lbs 03/08/24: Right: 85 lbs, Left: 63 lbs; Lateral pinch: Right: 24 lbs, Left: 13 lbs, and 3 point pinch: Right: 25 lbs, Left: 16  lbs   COORDINATION: 9 Hole Peg test: Right: 26 sec; Left: 10 sec 03/08/24: Left: 58 sec.   SENSATION:  12/17/23 Light touch: decreased sensation and numbness in L hand 5th digit Stereognosis:   Hot/Cold:   Proprioception: intact  EDEMA: mild swelling to L hand  COGNITION: Overall cognitive status: Within functional limits for tasks assessed  VISION: Subjective report: baseline Baseline vision: No visual deficits  VISION ASSESSMENT: WFL                                                                                                 TREATMENT DATE: 03/03/24  Moist heat applied to T-spine for pain management throughout session  Neuro re-ed: -Facilitated L hand FMC/dexterity skills working on manipulation of grooved pegs.  Pt practiced picking pegs up from dish and table top, rotating pegs from horizontal to vertical position, rotating between fingertips to place pegs into grooves, storing multiple in hand, then moving pegs 1 by 1 from palm to fingertips in prep for discarding.  Therapeutic Exercise: -Facilitated L hand lateral and 3 point pinch strengthening working with red, green, and blue resistant clips, moving clips on/off a dowel.  Min vc for engaging L LF into 3 point pinch pattern.  -Lumbrical strengthening: sustained grasp of a book, piece of paper, requiring min A to block L 4th and 5th digit PIPs to prevent flexion.  Demonstrated reps of active ROM to formulate lumbrical grasp alternated by digit ext, and isolating 4th and 5th  digit only to practice opposition to thumb with fully extended PIP joint.   -Attempted resistance with use of Can-Do digit exerciser to perform lumbrical strengthening, requiring mod A to block 4th and 5th digit PIPs into extension.  Self Care: -Issued size 10 oval 8 splint to use for therapeutic exercises only, allowing pt to block 4th or 5th digit PIPs into full extension when strengthening lumbricals with theraputty.  Pt verbalized understanding of recommendation to doff finger splint after exercise.   PATIENT EDUCATION: Education details: use of oval 8 splint Person educated: Patient Education method: Explanation and Verbal cues Education comprehension: verbalized understanding  HOME EXERCISE PROGRAM: Blue theraputty [facility] - coins at tabletop  GOALS: Goals reviewed with patient? Yes  SHORT TERM GOALS: Target date: 01/26/24  Pt will IND complete HEP for functional strenghening of non-dominant LUE. Baseline: not initiated Goal status: INITIAL   LONG TERM GOALS: Target date: 03/08/24  Pt will increase L pinch strength by 5 # of force to securely hold a wrench during leisure activities. Baseline: L pinch 14# Goal status: INITIAL  2.  Pt will increase L grip strength by 15# to wash dishes without dropping items.  Baseline: L grip 55#; has to use vision  Goal status: INITIAL  3.  Pt will improve LUE coordination by 30 seconds to independently manipulate fasteners during dressing tasks. Baseline: L 1'10.; improved,  Goal status: INITIAL  4.  Pt will improve dynamic seated balance to don B shoes independently on edge of bed without loss of balance. Baseline: requires chair with armrest to don shoes with standby assist; 03/08/24: chair  with arm rests  Goal status: INITIAL  ASSESSMENT: CLINICAL IMPRESSION: Pt reports that digits seem to buckle with various grasp/pinch patterns.  OT instructed pt in adding lumbrical strengthening to HEP to prevent buckling at the PIPs of digits 4  and 5 on the L hand.  Pt verbalized understanding of instruction to doff splint after exercise, as this is not needed throughout the day.  OT issued visual handout for lumbrical strengthening using theraputty, with pt showing good understanding of this addition.  Pt continues to present with L hand weakness, noting difficulty managing blue clips today with both lateral and 3 point pinch formations, and more consistently able to manage red and green with occasional slipping of thumb when using a lateral pinch, or slipping of the 3rd digit during a 3 point pinch pattern.  Translatory movements of small objects within palm remains difficult d/t decreased sensation in the hand, further impacted by weakness in the digits.  Pt. continues to benefit from OT services to work on L grip and lateral pinch strength, L FMC skills, and dynamic sitting balance to improve functional independence of ADL/IADL tasks at home.   PERFORMANCE DEFICITS: in functional skills including ADLs, IADLs, coordination, dexterity, sensation, and edema and psychosocial skills including environmental adaptation.   IMPAIRMENTS: are limiting patient from ADLs, IADLs, and leisure.   CO-MORBIDITIES: may have co-morbidities  that affects occupational performance. Patient will benefit from skilled OT to address above impairments and improve overall function.  MODIFICATION OR ASSISTANCE TO COMPLETE EVALUATION: No modification of tasks or assist necessary to complete an evaluation.  OT OCCUPATIONAL PROFILE AND HISTORY: Problem focused assessment: Including review of records relating to presenting problem.  CLINICAL DECISION MAKING: LOW - limited treatment options, no task modification necessary  REHAB POTENTIAL: Good  EVALUATION COMPLEXITY: Moderate    PLAN:  OT FREQUENCY: 1-2x/week  OT DURATION: 12 weeks  PLANNED INTERVENTIONS: 97168 OT Re-evaluation, 97535 self care/ADL training, 02889 therapeutic exercise, 97530 therapeutic  activity, and 97112 neuromuscular re-education  RECOMMENDED OTHER SERVICES: PT  CONSULTED AND AGREED WITH PLAN OF CARE: Patient and family member/caregiver  PLAN FOR NEXT SESSION: see above  Inocente Blazing, MS, OTR/L  03/08/2024, 11:58 AM

## 2024-03-09 ENCOUNTER — Ambulatory Visit
Admission: RE | Admit: 2024-03-09 | Discharge: 2024-03-09 | Disposition: A | Discharge status: Home / Self Care | Source: Ambulatory Visit

## 2024-03-09 DIAGNOSIS — S32029G Unspecified fracture of second lumbar vertebra, subsequent encounter for fracture with delayed healing: Secondary | ICD-10-CM | POA: Diagnosis present

## 2024-03-09 NOTE — Addendum Note (Signed)
 Addended by: Urias Sheek K on: 03/09/2024 02:06 PM   Modules accepted: Orders

## 2024-03-10 ENCOUNTER — Ambulatory Visit

## 2024-03-10 DIAGNOSIS — M6281 Muscle weakness (generalized): Secondary | ICD-10-CM

## 2024-03-10 DIAGNOSIS — R278 Other lack of coordination: Secondary | ICD-10-CM

## 2024-03-10 NOTE — Therapy (Unsigned)
 OUTPATIENT OCCUPATIONAL THERAPY NEURO TREATMENT NOTE  Patient Name: Thomas [name] MRN: [id number] DOB:May 26, 1932, 89 y.o., male Today's Date: 03/11/2024  PCP: Charlie Forte REFERRING PROVIDER: [name]  END OF SESSION:  OT End of Session - 03/11/24 0824     Visit Number 11    Number of Visits 34    Date for Recertification  05/31/24    Authorization Time Period Reporting period beginning 03/08/24    OT Start Time 1147    OT Stop Time 1230    OT Time Calculation (min) 43 min    Activity Tolerance Patient tolerated treatment well    Behavior During Therapy [facility] for tasks assessed/performed         Past Medical History:  Diagnosis Date   Aortic insufficiency    a. 07/2023 Echo: Mod AI; b. 09/2023 Echo: Mild-mod AI.   Aortic valve disorders    Arthritis    Basal cell carcinoma    face, nose left shoulder, left arm (06/19/2016)   Basal cell carcinoma 09/13/2020   right temple   BBB (bundle branch block)    hx right   CAD (coronary artery disease)    a. 10/2017 Cath: mod, nonobs dzs; b. 02/2021 Cath: mod, nonobs dzs; c. 11/2022 Cath: LAD 30ost/p, 19m, D1 min irregs, LCX 65p/m, OM1 min irregs, OM2 40, OM3 nl, RCA 30p, 44m, 40d w/ 40 in side branch-->Med rx; d. 11/2023 PCI: LAD 90ost/p (3.5x15 Onyx Frontier DES). Otw stable anatomy.   Chronic back pain    neck, thoracic, lower back (06/19/2016)   CKD (chronic kidney disease), stage III (HCC)    Complete heart block (HCC) 06/2016   a. 06/2016 s/p MDT PPM; b. 01/2023 s/p upgrade to MDT CRT-P (ser # [id number]).   GERD (gastroesophageal reflux disease)    Gout    Heart block    I've had type I, II Wenke before now (06/19/2016)   History of gout    History of hiatal hernia    self dx'd (06/19/2016)   Hyperlipidemia    Hypertension    Lyme disease    dx'd by me 2003; cx's showed dx 08/2015   Migraine    3-4/year (06/19/2016)   Mitral regurgitation    a. 07/2023 Echo: Mod MR.   NICM (nonischemic cardiomyopathy - in  setting of RV pacing) (HCC)    a. 02/2019 Echo: EF 45-50%; b. 10/2022 Echo: EF 30-35%; c. 01/2023 CRT-P upgrade; e. 07/2023 Echo: EF 40-45%, glob HK; f. 10/2023 Echo: EF of 55 to 60% with grade 1 DD, nl RV fxn, triv MR, mild to mod AI, and mildly dilated ascending aorta at 42 mm.   PAF (paroxysmal atrial fibrillation) (HCC)    a. CHA2DS2VASc = 7--> eliquis ; b. On tikosyn .   Paroxysmal atrial flutter (HCC)    Presence of permanent cardiac pacemaker    PVC's (premature ventricular contractions)    Renal cancer, left (HCC) 2006   S/P cryotherapy   Spinal stenosis    cervical, 1 thoracic, lumbar (06/19/2016)   Squamous carcinoma    face, nose left shoulder, left arm (06/19/2016)   Stroke (HCC)    TIA (transient ischemic attack) 06/14/2016   I'm not sure that's what it was (06/25/2016)   Visit for monitoring Tikosyn  therapy 09/09/2017   Past Surgical History:  Procedure Laterality Date   ANKLE FRACTURE SURGERY Right 1967   BACK SURGERY  05/07/2020   BASAL CELL CARCINOMA EXCISION     face, nose left shoulder, left arm  BIOPSY PROSTATE  2001 & 2003   BIV UPGRADE N/A 01/10/2023   Procedure: BIV UPGRADE;  Surgeon: Fernande Elspeth BROCKS, MD;  Location[facility] INVASIVE CV LAB;  Service: Cardiovascular;  Laterality: N/A;   CARDIAC CATHETERIZATION  1990's   CARDIOVERSION N/A 09/11/2017   Procedure: CARDIOVERSION;  Surgeon: Pietro Redell RAMAN, MD;  Location: [facility] ENDOSCOPY;  Service: Cardiovascular;  Laterality: N/A;   CORONARY STENT INTERVENTION N/A 11/10/2023   Procedure: CORONARY STENT INTERVENTION;  Surgeon: Darron Deatrice LABOR, MD;  Location: [facility] INVASIVE CV LAB;  Service: Cardiovascular;  Laterality: N/A;   FRACTURE SURGERY     HOLEP-LASER ENUCLEATION OF THE PROSTATE WITH MORCELLATION N/A 07/10/2020   Procedure: HOLEP-LASER ENUCLEATION OF THE PROSTATE WITH MORCELLATION;  Surgeon: Penne Knee, MD;  Location: [facility] ORS;  Service: Urology;  Laterality: N/A;   INGUINAL HERNIA REPAIR Left 2012   INSERT /  REPLACE / REMOVE PACEMAKER  06/19/2016   LAPAROSCOPIC ABLATION RENAL MASS     LEFT HEART CATH AND CORONARY ANGIOGRAPHY Left 10/23/2017   Procedure: LEFT HEART CATH AND CORONARY ANGIOGRAPHY;  Surgeon: Darron Deatrice LABOR, MD;  Location: [facility] INVASIVE CV LAB;  Service: Cardiovascular;  Laterality: Left;   LEFT HEART CATH AND CORONARY ANGIOGRAPHY Left 02/26/2021   Procedure: LEFT HEART CATH AND CORONARY ANGIOGRAPHY;  Surgeon: Darron Deatrice LABOR, MD;  Location: [facility] INVASIVE CV LAB;  Service: Cardiovascular;  Laterality: Left;   LEFT HEART CATH AND CORONARY ANGIOGRAPHY Left 11/10/2023   Procedure: LEFT HEART CATH AND CORONARY ANGIOGRAPHY;  Surgeon: Darron Deatrice LABOR, MD;  Location: [facility] INVASIVE CV LAB;  Service: Cardiovascular;  Laterality: Left;   PACEMAKER IMPLANT N/A 06/19/2016   Procedure: Pacemaker Implant;  Surgeon: Elspeth BROCKS Fernande, MD;  Location: [facility] INVASIVE CV LAB;  Service: Cardiovascular;  Laterality: N/A;   pacemasker     PROSTATE SURGERY     RIGHT/LEFT HEART CATH AND CORONARY ANGIOGRAPHY Bilateral 11/25/2022   Procedure: RIGHT/LEFT HEART CATH AND CORONARY ANGIOGRAPHY;  Surgeon: Darron Deatrice LABOR, MD;  Location: [facility] INVASIVE CV LAB;  Service: Cardiovascular;  Laterality: Bilateral;   SQUAMOUS CELL CARCINOMA EXCISION     face, nose left shoulder, left arm   TONSILLECTOMY AND ADENOIDECTOMY     Patient Active Problem List   Diagnosis Date Noted   Angina pectoris 11/10/2023   Weakness of left foot 10/29/2023   CKD stage 3b, GFR 30-44 ml/min (HCC) 10/29/2023   Obstructive sleep apnea 10/29/2023   Combined systolic and diastolic congestive heart failure (HCC) 10/29/2023   AKI (acute kidney injury) 04/29/2023   UTI (urinary tract infection) 04/29/2023   Chronic systolic heart failure (HCC) 11/25/2022   Sick sinus syndrome (HCC) 02/26/2022   Closed fracture of second lumbar vertebra (HCC) 09/26/2021   Dysphagia, unspecified 09/26/2021   Heart block 09/26/2021   Heartburn 09/26/2021    History of lumbar fusion 09/26/2021   Inflammation of sacroiliac joint 09/26/2021   Other symptoms and signs involving cognitive functions and awareness 09/26/2021   Prediabetes 09/26/2021   Low back pain 09/26/2021   Renal cell carcinoma (HCC) 09/26/2021   Essential hypertension 09/26/2021   Cerebrovascular disease 09/26/2021   Transient cerebral ischemic attack, unspecified 09/26/2021   Cerebrovascular disease, unspecified 09/26/2021   Transient ischemic attack 09/26/2021   Unstable angina (HCC)    Lumbar burst fracture (HCC) 05/04/2020   Hypogonadism male 10/10/2019   Persistent atrial fibrillation (HCC) 01/12/2019   Acute low back pain 11/04/2017   Abnormal screening cardiac CT    Visit for monitoring Tikosyn  therapy 09/09/2017  History of TIA (transient ischemic attack) 08/29/2016   Paroxysmal atrial fibrillation (HCC) 07/23/2016   CAD (coronary artery disease) 07/23/2016   Snores 06/27/2016   S/P placement of cardiac pacemaker 06/25/2016   Hemiparesis (HCC) 06/25/2016   Acute ischemic stroke (HCC)    Left-sided weakness    Hemisensory loss    Dysarthria    Stroke-like symptoms    Complete heart block (HCC)    Pain in thoracic spine    Orthostasis    Lethargy    Vertebral artery occlusion, left    TIA (transient ischemic attack) 06/14/2016   Arthritis 02/06/2015   Esophageal reflux 02/06/2015   Arthritis urica 02/06/2015   Cannot sleep 02/06/2015   Arthritis, degenerative 02/06/2015   Adenocarcinoma, renal cell (HCC) 02/06/2015   Benign prostatic hyperplasia with lower urinary tract symptoms 11/21/2014   Personal history of other malignant neoplasm of kidney 11/21/2014   History of Lyme disease 05/06/2014   Palpitations 12/31/2013   Hyperlipidemia 11/02/2010   HYPERTENSION, BENIGN 04/16/2010   ONSET DATE: 11/10/23  REFERRING DIAG: CVA  THERAPY DIAG:  Muscle weakness (generalized)  Other lack of coordination  Rationale for Evaluation and Treatment:  Rehabilitation  SUBJECTIVE:  SUBJECTIVE STATEMENT: Pt reports he may have a couple of gaps coming up in his therapy d/t plans for going out of town.  Pt accompanied by: self and significant other  PERTINENT HISTORY: 89 year old male who was admitted to hospital on 11/10/23 for management of unstable angina after pt developed clumsiness of the left arm after cardiac catheterization 7/7, workup for TIA. Per neurology note symptoms secondary to relative cerebral hypoperfusion in the setting of heart disease and right carotid artery and M1 stenosis. PMhx: PAF on Eliquis , HTN, HLD, prediabetes, OSA on CPAP, dominant left vertebral artery occlusion, TIA, SSS s/p PPM, BPH, renal CA, lumbar fusion, A-fib.  PRECAUTIONS: None  WEIGHT BEARING RESTRICTIONS: No  PAIN: 03/10/24: 2/10 thoracic spine Are you having pain? 4/10  back pain  FALLS: Has patient fallen in last 6 months? Yes. Number of falls 4  LIVING ENVIRONMENT: Lives with: lives with their spouse Lives in: House/apartment Stairs: Yes: Internal: 2 steps; none Has following equipment at home: Single point cane and Walker - 2 wheeled  PLOF: Independent with basic ADLs  PATIENT GOALS: to go to [facility] for more detailed neuro testing and return to function in LUE  OBJECTIVE:  Note: Objective measures were completed at Evaluation unless otherwise noted.  HAND DOMINANCE: Right  ADLs: Overall ADLs: MIN A Transfers/ambulation related to ADLs: Eating: IND - uses dominant R Grooming: SETUP UB Dressing: SETUP - buttons assist LB Dressing: MIN - hiking pants  Toileting: IND Bathing: IND Tub Shower transfers: IND Equipment: none  IADLs: Shopping: spouse completes as needed Light housekeeping: spouse completes as needed Meal Prep: assist from spouse Community mobility: SPC/RW as needed Medication management: IND Landscape architect: IND Handwriting: 100% legible R hand  MOBILITY STATUS: Independent and Hx of falls  POSTURE  COMMENTS:  rounded shoulders and forward head Sitting balance: Sits without UE support up to 30 sec  ACTIVITY TOLERANCE: Activity tolerance: IND - tolerates 20 min standing activities   FUNCTIONAL OUTCOME MEASURES:    UPPER EXTREMITY ROM:   BUE AROM WFL   UPPER EXTREMITY MMT:     MMT Right eval Left eval  Shoulder flexion    Shoulder abduction    Shoulder adduction    Shoulder extension    Shoulder internal rotation    Shoulder external rotation  Middle trapezius    Lower trapezius    Elbow flexion    Elbow extension    Wrist flexion    Wrist extension    Wrist ulnar deviation    Wrist radial deviation    Wrist pronation    Wrist supination    (Blank rows = not tested)  HAND FUNCTION: Grip strength: Right: 76 lbs; Left: 55 lbs, Lateral pinch: Right: 15 lbs, Left: 11 lbs, and 3 point pinch: Right: 21 lbs, Left: 14 lbs 03/08/24: Grip strength: Right: 85 lbs, Left: 63 lbs; Lateral pinch: Right: 24 lbs, Left: 13 lbs, and 3 point pinch: Right: 25 lbs, Left: 16 lbs   COORDINATION: 9 Hole Peg test: Right: 26 sec; Left: 10 sec 03/08/24: Left: 58 sec.   SENSATION:  12/17/23 Light touch: decreased sensation and numbness in L hand 5th digit Stereognosis:   Hot/Cold:   Proprioception: intact  EDEMA: mild swelling to L hand  COGNITION: Overall cognitive status: Within functional limits for tasks assessed  VISION: Subjective report: baseline Baseline vision: No visual deficits  VISION ASSESSMENT: WFL                                                                                                 TREATMENT DATE: 03/10/24  Moist heat applied to T-spine for pain management throughout session  Neuro re-ed: -Facilitated L hand FMC/dexterity skills working with [place]  discs (20).  Pt completed 1 trial on the R for comparison.  -Rotating 20 discs black>red and placing into grid   -R hand 1 min 9 sec   -L hand 3 min 25 sec, 2 min 38 sec, 2 min 17 sec    -Facilitated L GMC working on forward and lateral reaching toward small target, placing clips on a horizontal and vertical dowel  Therapeutic Exercise: -Lumbrical strengthening using wide base therapy resistant clothespins (yellow, red, green, blue), requiring min A to block L 4th and 5th digit PIPs to prevent flexion; added oval 8 splint to L 5th digit for blocking PIP flexion.   -Reps of lateral and 3 point pinch strengthening with same clips noted above.  -Reps of digit abd/add, active assist, progressing to vc for engaging the thumb and mirroring R hand for increasing ROM in the L hand. -Reps of digit abd/add with light manual resistance from OT.  PATIENT EDUCATION: Education details: HEP Person educated: Patient Education method: Leisure Centre Manager cues Education comprehension: verbalized understanding  HOME EXERCISE PROGRAM: [employer] - coins at tabletop  GOALS: Goals reviewed with patient? Yes  SHORT TERM GOALS: Target date: 04/19/24  Pt will IND complete HEP for functional strenghening of non-dominant LUE. Baseline: not initiated; 03/08/24: Min vc with recently added exercises (intrinsic hand strengthening) Goal status: in progress   LONG TERM GOALS: Target date: 05/31/24  Pt will increase L lateral pinch strength by 5 # of force to securely hold a wrench during leisure activities. Baseline: L pinch 11#; 03/08/24: L 13# Goal status: in progress  2.  Pt will increase L grip strength by 15# to wash dishes without dropping items.  Baseline: L  grip 55#; 03/08/24: L grip 63# (pt reports having to keep eyes on dishes to avoid dropping any d/t sensory deficits) Goal status: in progress   3.  Pt will improve LUE coordination by 30 seconds to independently manipulate fasteners during dressing tasks. Baseline: L 1'10; 03/08/24: L 58 sec  Goal status: in progress  4.  Pt will improve dynamic seated balance to don B shoes independently on edge of bed without loss  of balance. Baseline: requires chair with armrest to don shoes with standby assist; 03/08/24: Consistently sits in chair with arm rests (without SBA) for increased stability Goal status: d/c  5.  Pt will increase L hand lumbrical strength in order to securely hold and stabilize flat objects (notebook) in L hand.  Baseline: 03/08/24: L hand 4th and 5th digit PIPs flex and require manual joint blocking or oval 8 splint to prevent flexion.     Goal status: New  ASSESSMENT: CLINICAL IMPRESSION: Pt reports having his CT scan for his back yesterday, but imaging has not yet been read.  Pt continues to be on hold for PT until results are read by MD.  Pt with good tolerance to above noted neuro re-ed and therapeutic exercises this date targeting LUE strength and coordination.  Pt had frequent dropping when rotating [place]  discs within thumb and fingertips, but was able to improve his speed with each of the 3 trials.  Lumbrical strength in the L hand continues to be weak.  Pt requires PIP joint blocking at the L 4th and 5th digit when attempting to formulate a lumbrical grasp, but has also found benefit from the oval 8 splints to these digits during lumbrical exercises at home and in clinic.  Pt is able to manage 2#-6# clips consistently, and struggles with the 8# clips when using a lumbrical grasp, lateral pinch, or 3 point pinch.  Pt continues to benefit from OT services to work on L hand strength and L [facility] skills to improve functional independence of ADL/IADL tasks at home.   PERFORMANCE DEFICITS: in functional skills including ADLs, IADLs, coordination, dexterity, sensation, and edema and psychosocial skills including environmental adaptation.   IMPAIRMENTS: are limiting patient from ADLs, IADLs, and leisure.   CO-MORBIDITIES: may have co-morbidities  that affects occupational performance. Patient will benefit from skilled OT to address above impairments and improve overall function.  MODIFICATION OR  ASSISTANCE TO COMPLETE EVALUATION: No modification of tasks or assist necessary to complete an evaluation.  OT OCCUPATIONAL PROFILE AND HISTORY: Problem focused assessment: Including review of records relating to presenting problem.  CLINICAL DECISION MAKING: LOW - limited treatment options, no task modification necessary  REHAB POTENTIAL: Good  EVALUATION COMPLEXITY: Moderate    PLAN:  OT FREQUENCY: 1-2x/week  OT DURATION: 12 weeks  PLANNED INTERVENTIONS: 97168 OT Re-evaluation, 97535 self care/ADL training, 02889 therapeutic exercise, 97530 therapeutic activity, and 97112 neuromuscular re-education  RECOMMENDED OTHER SERVICES: PT  CONSULTED AND AGREED WITH PLAN OF CARE: Patient and family member/caregiver  PLAN FOR NEXT SESSION: see above  Inocente Blazing, MS, OTR/L  03/11/2024, 8:27 AM

## 2024-03-15 ENCOUNTER — Ambulatory Visit

## 2024-03-15 DIAGNOSIS — M6281 Muscle weakness (generalized): Secondary | ICD-10-CM

## 2024-03-15 DIAGNOSIS — R278 Other lack of coordination: Secondary | ICD-10-CM

## 2024-03-16 NOTE — Therapy (Signed)
 OUTPATIENT OCCUPATIONAL THERAPY NEURO TREATMENT NOTE  Patient Name: Thomas [name] MRN: [id number] DOB:04-22-1933, 89 y.o., male Today's Date: 03/16/2024  PCP: Charlie Forte REFERRING PROVIDER: [name]  END OF SESSION:  OT End of Session - 03/16/24 1855     Visit Number 12    Number of Visits 34    Date for Recertification  05/31/24    Authorization Time Period Reporting period beginning 03/08/24    OT Start Time 1154    OT Stop Time 1233    OT Time Calculation (min) 39 min    Activity Tolerance Patient tolerated treatment well    Behavior During Therapy [facility] for tasks assessed/performed         Past Medical History:  Diagnosis Date   Aortic insufficiency    a. 07/2023 Echo: Mod AI; b. 09/2023 Echo: Mild-mod AI.   Aortic valve disorders    Arthritis    Basal cell carcinoma    face, nose left shoulder, left arm (06/19/2016)   Basal cell carcinoma 09/13/2020   right temple   BBB (bundle branch block)    hx right   CAD (coronary artery disease)    a. 10/2017 Cath: mod, nonobs dzs; b. 02/2021 Cath: mod, nonobs dzs; c. 11/2022 Cath: LAD 30ost/p, 73m, D1 min irregs, LCX 65p/m, OM1 min irregs, OM2 40, OM3 nl, RCA 30p, 62m, 40d w/ 40 in side branch-->Med rx; d. 11/2023 PCI: LAD 90ost/p (3.5x15 Onyx Frontier DES). Otw stable anatomy.   Chronic back pain    neck, thoracic, lower back (06/19/2016)   CKD (chronic kidney disease), stage III (HCC)    Complete heart block (HCC) 06/2016   a. 06/2016 s/p MDT PPM; b. 01/2023 s/p upgrade to MDT CRT-P (ser # [id number]).   GERD (gastroesophageal reflux disease)    Gout    Heart block    I've had type I, II Wenke before now (06/19/2016)   History of gout    History of hiatal hernia    self dx'd (06/19/2016)   Hyperlipidemia    Hypertension    Lyme disease    dx'd by me 2003; cx's showed dx 08/2015   Migraine    3-4/year (06/19/2016)   Mitral regurgitation    a. 07/2023 Echo: Mod MR.   NICM (nonischemic cardiomyopathy - in  setting of RV pacing) (HCC)    a. 02/2019 Echo: EF 45-50%; b. 10/2022 Echo: EF 30-35%; c. 01/2023 CRT-P upgrade; e. 07/2023 Echo: EF 40-45%, glob HK; f. 10/2023 Echo: EF of 55 to 60% with grade 1 DD, nl RV fxn, triv MR, mild to mod AI, and mildly dilated ascending aorta at 42 mm.   PAF (paroxysmal atrial fibrillation) (HCC)    a. CHA2DS2VASc = 7--> eliquis ; b. On tikosyn .   Paroxysmal atrial flutter (HCC)    Presence of permanent cardiac pacemaker    PVC's (premature ventricular contractions)    Renal cancer, left (HCC) 2006   S/P cryotherapy   Spinal stenosis    cervical, 1 thoracic, lumbar (06/19/2016)   Squamous carcinoma    face, nose left shoulder, left arm (06/19/2016)   Stroke (HCC)    TIA (transient ischemic attack) 06/14/2016   I'm not sure that's what it was (06/25/2016)   Visit for monitoring Tikosyn  therapy 09/09/2017   Past Surgical History:  Procedure Laterality Date   ANKLE FRACTURE SURGERY Right 1967   BACK SURGERY  05/07/2020   BASAL CELL CARCINOMA EXCISION     face, nose left shoulder, left arm  BIOPSY PROSTATE  2001 & 2003   BIV UPGRADE N/A 01/10/2023   Procedure: BIV UPGRADE;  Surgeon: Fernande Elspeth BROCKS, MD;  Location: [facility] INVASIVE CV LAB;  Service: Cardiovascular;  Laterality: N/A;   CARDIAC CATHETERIZATION  1990's   CARDIOVERSION N/A 09/11/2017   Procedure: CARDIOVERSION;  Surgeon: Pietro Redell RAMAN, MD;  Location: [facility] ENDOSCOPY;  Service: Cardiovascular;  Laterality: N/A;   CORONARY STENT INTERVENTION N/A 11/10/2023   Procedure: CORONARY STENT INTERVENTION;  Surgeon: Darron Deatrice LABOR, MD;  Location: [facility] INVASIVE CV LAB;  Service: Cardiovascular;  Laterality: N/A;   FRACTURE SURGERY     HOLEP-LASER ENUCLEATION OF THE PROSTATE WITH MORCELLATION N/A 07/10/2020   Procedure: HOLEP-LASER ENUCLEATION OF THE PROSTATE WITH MORCELLATION;  Surgeon: Penne Knee, MD;  Location: [facility] ORS;  Service: Urology;  Laterality: N/A;   INGUINAL HERNIA REPAIR Left 2012   INSERT /  REPLACE / REMOVE PACEMAKER  06/19/2016   LAPAROSCOPIC ABLATION RENAL MASS     LEFT HEART CATH AND CORONARY ANGIOGRAPHY Left 10/23/2017   Procedure: LEFT HEART CATH AND CORONARY ANGIOGRAPHY;  Surgeon: Darron Deatrice LABOR, MD;  Location: [facility] INVASIVE CV LAB;  Service: Cardiovascular;  Laterality: Left;   LEFT HEART CATH AND CORONARY ANGIOGRAPHY Left 02/26/2021   Procedure: LEFT HEART CATH AND CORONARY ANGIOGRAPHY;  Surgeon: Darron Deatrice LABOR, MD;  Location: [facility] INVASIVE CV LAB;  Service: Cardiovascular;  Laterality: Left;   LEFT HEART CATH AND CORONARY ANGIOGRAPHY Left 11/10/2023   Procedure: LEFT HEART CATH AND CORONARY ANGIOGRAPHY;  Surgeon: Darron Deatrice LABOR, MD;  Location: [facility] INVASIVE CV LAB;  Service: Cardiovascular;  Laterality: Left;   PACEMAKER IMPLANT N/A 06/19/2016   Procedure: Pacemaker Implant;  Surgeon: Elspeth BROCKS Fernande, MD;  Location: [facility] INVASIVE CV LAB;  Service: Cardiovascular;  Laterality: N/A;   pacemasker     PROSTATE SURGERY     RIGHT/LEFT HEART CATH AND CORONARY ANGIOGRAPHY Bilateral 11/25/2022   Procedure: RIGHT/LEFT HEART CATH AND CORONARY ANGIOGRAPHY;  Surgeon: Darron Deatrice LABOR, MD;  Location: [facility] INVASIVE CV LAB;  Service: Cardiovascular;  Laterality: Bilateral;   SQUAMOUS CELL CARCINOMA EXCISION     face, nose left shoulder, left arm   TONSILLECTOMY AND ADENOIDECTOMY     Patient Active Problem List   Diagnosis Date Noted   Angina pectoris 11/10/2023   Weakness of left foot 10/29/2023   CKD stage 3b, GFR 30-44 ml/min (HCC) 10/29/2023   Obstructive sleep apnea 10/29/2023   Combined systolic and diastolic congestive heart failure (HCC) 10/29/2023   AKI (acute kidney injury) 04/29/2023   UTI (urinary tract infection) 04/29/2023   Chronic systolic heart failure (HCC) 11/25/2022   Sick sinus syndrome (HCC) 02/26/2022   Closed fracture of second lumbar vertebra (HCC) 09/26/2021   Dysphagia, unspecified 09/26/2021   Heart block 09/26/2021   Heartburn 09/26/2021    History of lumbar fusion 09/26/2021   Inflammation of sacroiliac joint 09/26/2021   Other symptoms and signs involving cognitive functions and awareness 09/26/2021   Prediabetes 09/26/2021   Low back pain 09/26/2021   Renal cell carcinoma (HCC) 09/26/2021   Essential hypertension 09/26/2021   Cerebrovascular disease 09/26/2021   Transient cerebral ischemic attack, unspecified 09/26/2021   Cerebrovascular disease, unspecified 09/26/2021   Transient ischemic attack 09/26/2021   Unstable angina (HCC)    Lumbar burst fracture (HCC) 05/04/2020   Hypogonadism male 10/10/2019   Persistent atrial fibrillation (HCC) 01/12/2019   Acute low back pain 11/04/2017   Abnormal screening cardiac CT    Visit for monitoring Tikosyn  therapy 09/09/2017  History of TIA (transient ischemic attack) 08/29/2016   Paroxysmal atrial fibrillation (HCC) 07/23/2016   CAD (coronary artery disease) 07/23/2016   Snores 06/27/2016   S/P placement of cardiac pacemaker 06/25/2016   Hemiparesis (HCC) 06/25/2016   Acute ischemic stroke (HCC)    Left-sided weakness    Hemisensory loss    Dysarthria    Stroke-like symptoms    Complete heart block (HCC)    Pain in thoracic spine    Orthostasis    Lethargy    Vertebral artery occlusion, left    TIA (transient ischemic attack) 06/14/2016   Arthritis 02/06/2015   Esophageal reflux 02/06/2015   Arthritis urica 02/06/2015   Cannot sleep 02/06/2015   Arthritis, degenerative 02/06/2015   Adenocarcinoma, renal cell (HCC) 02/06/2015   Benign prostatic hyperplasia with lower urinary tract symptoms 11/21/2014   Personal history of other malignant neoplasm of kidney 11/21/2014   History of Lyme disease 05/06/2014   Palpitations 12/31/2013   Hyperlipidemia 11/02/2010   HYPERTENSION, BENIGN 04/16/2010   ONSET DATE: 11/10/23  REFERRING DIAG: CVA  THERAPY DIAG:  Muscle weakness (generalized)  Other lack of coordination  Rationale for Evaluation and Treatment:  Rehabilitation  SUBJECTIVE:  SUBJECTIVE STATEMENT: Pt reports that his imaging showed that some of his hardware came loose in his back.  Pt has follow up with neuro surgeon this week. Pt accompanied by: self and significant other  PERTINENT HISTORY: 89 year old male who was admitted to hospital on 11/10/23 for management of unstable angina after pt developed clumsiness of the left arm after cardiac catheterization 7/7, workup for TIA. Per neurology note symptoms secondary to relative cerebral hypoperfusion in the setting of heart disease and right carotid artery and M1 stenosis. PMhx: PAF on Eliquis , HTN, HLD, prediabetes, OSA on CPAP, dominant left vertebral artery occlusion, TIA, SSS s/p PPM, BPH, renal CA, lumbar fusion, A-fib.  PRECAUTIONS: None  WEIGHT BEARING RESTRICTIONS: No  PAIN: 03/15/24: 2/10 thoracic spine Are you having pain? 4/10  back pain  FALLS: Has patient fallen in last 6 months? Yes. Number of falls 4  LIVING ENVIRONMENT: Lives with: lives with their spouse Lives in: House/apartment Stairs: Yes: Internal: 2 steps; none Has following equipment at home: Single point cane and Walker - 2 wheeled  PLOF: Independent with basic ADLs  PATIENT GOALS: to go to [facility] for more detailed neuro testing and return to function in LUE  OBJECTIVE:  Note: Objective measures were completed at Evaluation unless otherwise noted.  HAND DOMINANCE: Right  ADLs: Overall ADLs: MIN A Transfers/ambulation related to ADLs: Eating: IND - uses dominant R Grooming: SETUP UB Dressing: SETUP - buttons assist LB Dressing: MIN - hiking pants  Toileting: IND Bathing: IND Tub Shower transfers: IND Equipment: none  IADLs: Shopping: spouse completes as needed Light housekeeping: spouse completes as needed Meal Prep: assist from spouse Community mobility: SPC/RW as needed Medication management: IND Landscape architect: IND Handwriting: 100% legible R hand  MOBILITY STATUS: Independent  and Hx of falls  POSTURE COMMENTS:  rounded shoulders and forward head Sitting balance: Sits without UE support up to 30 sec  ACTIVITY TOLERANCE: Activity tolerance: IND - tolerates 20 min standing activities   FUNCTIONAL OUTCOME MEASURES:    UPPER EXTREMITY ROM:   BUE AROM WFL   UPPER EXTREMITY MMT:     MMT Right eval Left eval  Shoulder flexion    Shoulder abduction    Shoulder adduction    Shoulder extension    Shoulder internal rotation    Shoulder  external rotation    Middle trapezius    Lower trapezius    Elbow flexion    Elbow extension    Wrist flexion    Wrist extension    Wrist ulnar deviation    Wrist radial deviation    Wrist pronation    Wrist supination    (Blank rows = not tested)  HAND FUNCTION: Grip strength: Right: 76 lbs; Left: 55 lbs, Lateral pinch: Right: 15 lbs, Left: 11 lbs, and 3 point pinch: Right: 21 lbs, Left: 14 lbs 03/08/24: Grip strength: Right: 85 lbs, Left: 63 lbs; Lateral pinch: Right: 24 lbs, Left: 13 lbs, and 3 point pinch: Right: 25 lbs, Left: 16 lbs   COORDINATION: 9 Hole Peg test: Right: 26 sec; Left: 10 sec 03/08/24: Left: 58 sec.   SENSATION:  12/17/23 Light touch: decreased sensation and numbness in L hand 5th digit Stereognosis:   Hot/Cold:   Proprioception: intact  EDEMA: mild swelling to L hand  COGNITION: Overall cognitive status: Within functional limits for tasks assessed  VISION: Subjective report: baseline Baseline vision: No visual deficits  VISION ASSESSMENT: WFL                                                                                                 TREATMENT DATE: 03/15/24  Moist heat applied to T-spine for pain management throughout session  Therapeutic Activity: -Facilitated L hand pinch strengthening and GMC by reaching toward a target; pt used wide base clothespins (yellow, red, green, blue) to place clips on/off a vertical and horizontal dowel.  Dowel position alternated to challenge  forward and lateral reaching patterns.  Pt practiced lumbrical grasp, lateral pinch, and 3 point pinch for above noted activity.  Therapeutic Exercise: -Isometric lumbrical strengthening grasping edge of table top, requiring manual blocking to prevent L 5th digit PIP from flexing -Reps of resisted MP extension of digits 2-5 -Manual resistance provided for reps of digit abd/add, progressing to isolated digit abd/add for the L 2nd and 3rd digit   PATIENT EDUCATION: Education details: HEP Person educated: Patient Education method: Explanation and Verbal cues Education comprehension: verbalized understanding  HOME EXERCISE PROGRAM: Blue theraputty [facility] - coins at tabletop Intrinsic hand strengthening  GOALS: Goals reviewed with patient? Yes  SHORT TERM GOALS: Target date: 04/19/24  Pt will IND complete HEP for functional strenghening of non-dominant LUE. Baseline: not initiated; 03/08/24: Min vc with recently added exercises (intrinsic hand strengthening) Goal status: in progress   LONG TERM GOALS: Target date: 05/31/24  Pt will increase L lateral pinch strength by 5 # of force to securely hold a wrench during leisure activities. Baseline: L pinch 11#; 03/08/24: L 13# Goal status: in progress  2.  Pt will increase L grip strength by 15# to wash dishes without dropping items.  Baseline: L grip 55#; 03/08/24: L grip 63# (pt reports having to keep eyes on dishes to avoid dropping any d/t sensory deficits) Goal status: in progress   3.  Pt will improve LUE coordination by 30 seconds to independently manipulate fasteners during dressing tasks. Baseline: L 1'10; 03/08/24: L 58 sec  Goal status: in progress  4.  Pt will improve dynamic seated balance to don B shoes independently on edge of bed without loss of balance. Baseline: requires chair with armrest to don shoes with standby assist; 03/08/24: Consistently sits in chair with arm rests (without SBA) for increased stability Goal  status: d/c  5.  Pt will increase L hand lumbrical strength in order to securely hold and stabilize flat objects (notebook) in L hand.  Baseline: 03/08/24: L hand 4th and 5th digit PIPs flex and require manual joint blocking or oval 8 splint to prevent flexion.     Goal status: New  ASSESSMENT: CLINICAL IMPRESSION: Continued focus on intrinsic strengthening.  Pt requires active assist for L hand digit abd/add in the 2nd and 3rd digit only.  Pt able to sustain lumbrical grasp today using up to 6# wide base clothespins (blue) without assist to maintain PIP ext in the 4th digit.  5th digit tends to flex at the PIP joint without manual blocking.  Pt continues to utilize oval 8 splint at home to facilitate joint blocking.  Forward and lateral reaching showing improved control, though still mildly ataxic.  Pt verbalizes and demonstrates the most control when reaching laterally as compared to forward with an extended elbow.  Pt continues to benefit from OT services to work on L hand strength and L [facility] skills to improve functional independence of ADL/IADL tasks at home.   PERFORMANCE DEFICITS: in functional skills including ADLs, IADLs, coordination, dexterity, sensation, and edema and psychosocial skills including environmental adaptation.   IMPAIRMENTS: are limiting patient from ADLs, IADLs, and leisure.   CO-MORBIDITIES: may have co-morbidities  that affects occupational performance. Patient will benefit from skilled OT to address above impairments and improve overall function.  MODIFICATION OR ASSISTANCE TO COMPLETE EVALUATION: No modification of tasks or assist necessary to complete an evaluation.  OT OCCUPATIONAL PROFILE AND HISTORY: Problem focused assessment: Including review of records relating to presenting problem.  CLINICAL DECISION MAKING: LOW - limited treatment options, no task modification necessary  REHAB POTENTIAL: Good  EVALUATION COMPLEXITY: Moderate    PLAN:  OT FREQUENCY:  1-2x/week  OT DURATION: 12 weeks  PLANNED INTERVENTIONS: 97168 OT Re-evaluation, 97535 self care/ADL training, 02889 therapeutic exercise, 97530 therapeutic activity, and 97112 neuromuscular re-education  RECOMMENDED OTHER SERVICES: PT  CONSULTED AND AGREED WITH PLAN OF CARE: Patient and family member/caregiver  PLAN FOR NEXT SESSION: see above  Inocente Blazing, MS, OTR/L  03/16/2024, 6:59 PM

## 2024-03-17 ENCOUNTER — Ambulatory Visit

## 2024-03-17 ENCOUNTER — Ambulatory Visit: Admitting: Physical Therapy

## 2024-03-17 DIAGNOSIS — M6281 Muscle weakness (generalized): Secondary | ICD-10-CM

## 2024-03-17 DIAGNOSIS — R278 Other lack of coordination: Secondary | ICD-10-CM

## 2024-03-20 NOTE — Therapy (Signed)
 OUTPATIENT OCCUPATIONAL THERAPY NEURO TREATMENT NOTE  Patient Name: Thomas [name] MRN: [id number] DOB:[birth year], 89 y.o., male Today's Date: 03/20/2024  PCP: Charlie Forte REFERRING PROVIDER: [name]  END OF SESSION:  OT End of Session - 03/20/24 1927     Visit Number 13    Number of Visits 34    Date for Recertification  05/31/24    Authorization Time Period Reporting period beginning 03/08/24    OT Start Time 1145    OT Stop Time 1230    OT Time Calculation (min) 45 min    Activity Tolerance Patient tolerated treatment well    Behavior During Therapy [facility] for tasks assessed/performed         Past Medical History:  Diagnosis Date   Aortic insufficiency    a. 07/2023 Echo: Mod AI; b. 09/2023 Echo: Mild-mod AI.   Aortic valve disorders    Arthritis    Basal cell carcinoma    face, nose left shoulder, left arm (06/19/2016)   Basal cell carcinoma 09/13/2020   right temple   BBB (bundle branch block)    hx right   CAD (coronary artery disease)    a. 10/2017 Cath: mod, nonobs dzs; b. 02/2021 Cath: mod, nonobs dzs; c. 11/2022 Cath: LAD 30ost/p, 21m, D1 min irregs, LCX 65p/m, OM1 min irregs, OM2 40, OM3 nl, RCA 30p, 52m, 40d w/ 40 in side branch-->Med rx; d. 11/2023 PCI: LAD 90ost/p (3.5x15 Onyx Frontier DES). Otw stable anatomy.   Chronic back pain    neck, thoracic, lower back (06/19/2016)   CKD (chronic kidney disease), stage III (HCC)    Complete heart block (HCC) 06/2016   a. 06/2016 s/p MDT PPM; b. 01/2023 s/p upgrade to MDT CRT-P (ser # [id number]).   GERD (gastroesophageal reflux disease)    Gout    Heart block    I've had type I, II Wenke before now (06/19/2016)   History of gout    History of hiatal hernia    self dx'd (06/19/2016)   Hyperlipidemia    Hypertension    Lyme disease    dx'd by me 2003; cx's showed dx 08/2015   Migraine    3-4/year (06/19/2016)   Mitral regurgitation    a. 07/2023 Echo: Mod MR.   NICM (nonischemic cardiomyopathy - in  setting of RV pacing) (HCC)    a. 02/2019 Echo: EF 45-50%; b. 10/2022 Echo: EF 30-35%; c. 01/2023 CRT-P upgrade; e. 07/2023 Echo: EF 40-45%, glob HK; f. 10/2023 Echo: EF of 55 to 60% with grade 1 DD, nl RV fxn, triv MR, mild to mod AI, and mildly dilated ascending aorta at 42 mm.   PAF (paroxysmal atrial fibrillation) (HCC)    a. CHA2DS2VASc = 7--> eliquis ; b. On tikosyn .   Paroxysmal atrial flutter (HCC)    Presence of permanent cardiac pacemaker    PVC's (premature ventricular contractions)    Renal cancer, left (HCC) 2006   S/P cryotherapy   Spinal stenosis    cervical, 1 thoracic, lumbar (06/19/2016)   Squamous carcinoma    face, nose left shoulder, left arm (06/19/2016)   Stroke (HCC)    TIA (transient ischemic attack) 06/14/2016   I'm not sure that's what it was (06/25/2016)   Visit for monitoring Tikosyn  therapy 09/09/2017   Past Surgical History:  Procedure Laterality Date   ANKLE FRACTURE SURGERY Right 1967   BACK SURGERY  05/07/2020   BASAL CELL CARCINOMA EXCISION     face, nose left shoulder, left arm  BIOPSY PROSTATE  2001 & 2003   BIV UPGRADE N/A 01/10/2023   Procedure: BIV UPGRADE;  Surgeon: Fernande Elspeth BROCKS, MD;  Location: [facility] INVASIVE CV LAB;  Service: Cardiovascular;  Laterality: N/A;   CARDIAC CATHETERIZATION  1990's   CARDIOVERSION N/A 09/11/2017   Procedure: CARDIOVERSION;  Surgeon: Pietro Redell RAMAN, MD;  Location: [facility] ENDOSCOPY;  Service: Cardiovascular;  Laterality: N/A;   CORONARY STENT INTERVENTION N/A 11/10/2023   Procedure: CORONARY STENT INTERVENTION;  Surgeon: Darron Deatrice LABOR, MD;  Location: [facility] INVASIVE CV LAB;  Service: Cardiovascular;  Laterality: N/A;   FRACTURE SURGERY     HOLEP-LASER ENUCLEATION OF THE PROSTATE WITH MORCELLATION N/A 07/10/2020   Procedure: HOLEP-LASER ENUCLEATION OF THE PROSTATE WITH MORCELLATION;  Surgeon: Penne Knee, MD;  Location: [facility] ORS;  Service: Urology;  Laterality: N/A;   INGUINAL HERNIA REPAIR Left 2012   INSERT /  REPLACE / REMOVE PACEMAKER  06/19/2016   LAPAROSCOPIC ABLATION RENAL MASS     LEFT HEART CATH AND CORONARY ANGIOGRAPHY Left 10/23/2017   Procedure: LEFT HEART CATH AND CORONARY ANGIOGRAPHY;  Surgeon: Darron Deatrice LABOR, MD;  Location: [facility] INVASIVE CV LAB;  Service: Cardiovascular;  Laterality: Left;   LEFT HEART CATH AND CORONARY ANGIOGRAPHY Left 02/26/2021   Procedure: LEFT HEART CATH AND CORONARY ANGIOGRAPHY;  Surgeon: Darron Deatrice LABOR, MD;  Location: [facility] INVASIVE CV LAB;  Service: Cardiovascular;  Laterality: Left;   LEFT HEART CATH AND CORONARY ANGIOGRAPHY Left 11/10/2023   Procedure: LEFT HEART CATH AND CORONARY ANGIOGRAPHY;  Surgeon: Darron Deatrice LABOR, MD;  Location: [facility] INVASIVE CV LAB;  Service: Cardiovascular;  Laterality: Left;   PACEMAKER IMPLANT N/A 06/19/2016   Procedure: Pacemaker Implant;  Surgeon: Elspeth BROCKS Fernande, MD;  Location: [facility] INVASIVE CV LAB;  Service: Cardiovascular;  Laterality: N/A;   pacemasker     PROSTATE SURGERY     RIGHT/LEFT HEART CATH AND CORONARY ANGIOGRAPHY Bilateral 11/25/2022   Procedure: RIGHT/LEFT HEART CATH AND CORONARY ANGIOGRAPHY;  Surgeon: Darron Deatrice LABOR, MD;  Location: [facility] INVASIVE CV LAB;  Service: Cardiovascular;  Laterality: Bilateral;   SQUAMOUS CELL CARCINOMA EXCISION     face, nose left shoulder, left arm   TONSILLECTOMY AND ADENOIDECTOMY     Patient Active Problem List   Diagnosis Date Noted   Angina pectoris 11/10/2023   Weakness of left foot 10/29/2023   CKD stage 3b, GFR 30-44 ml/min (HCC) 10/29/2023   Obstructive sleep apnea 10/29/2023   Combined systolic and diastolic congestive heart failure (HCC) 10/29/2023   AKI (acute kidney injury) 04/29/2023   UTI (urinary tract infection) 04/29/2023   Chronic systolic heart failure (HCC) 11/25/2022   Sick sinus syndrome (HCC) 02/26/2022   Closed fracture of second lumbar vertebra (HCC) 09/26/2021   Dysphagia, unspecified 09/26/2021   Heart block 09/26/2021   Heartburn 09/26/2021    History of lumbar fusion 09/26/2021   Inflammation of sacroiliac joint 09/26/2021   Other symptoms and signs involving cognitive functions and awareness 09/26/2021   Prediabetes 09/26/2021   Low back pain 09/26/2021   Renal cell carcinoma (HCC) 09/26/2021   Essential hypertension 09/26/2021   Cerebrovascular disease 09/26/2021   Transient cerebral ischemic attack, unspecified 09/26/2021   Cerebrovascular disease, unspecified 09/26/2021   Transient ischemic attack 09/26/2021   Unstable angina (HCC)    Lumbar burst fracture (HCC) 05/04/2020   Hypogonadism male 10/10/2019   Persistent atrial fibrillation (HCC) 01/12/2019   Acute low back pain 11/04/2017   Abnormal screening cardiac CT    Visit for monitoring Tikosyn  therapy 09/09/2017  History of TIA (transient ischemic attack) 08/29/2016   Paroxysmal atrial fibrillation (HCC) 07/23/2016   CAD (coronary artery disease) 07/23/2016   Snores 06/27/2016   S/P placement of cardiac pacemaker 06/25/2016   Hemiparesis (HCC) 06/25/2016   Acute ischemic stroke (HCC)    Left-sided weakness    Hemisensory loss    Dysarthria    Stroke-like symptoms    Complete heart block (HCC)    Pain in thoracic spine    Orthostasis    Lethargy    Vertebral artery occlusion, left    TIA (transient ischemic attack) 06/14/2016   Arthritis 02/06/2015   Esophageal reflux 02/06/2015   Arthritis urica 02/06/2015   Cannot sleep 02/06/2015   Arthritis, degenerative 02/06/2015   Adenocarcinoma, renal cell (HCC) 02/06/2015   Benign prostatic hyperplasia with lower urinary tract symptoms 11/21/2014   Personal history of other malignant neoplasm of kidney 11/21/2014   History of Lyme disease 05/06/2014   Palpitations 12/31/2013   Hyperlipidemia 11/02/2010   HYPERTENSION, BENIGN 04/16/2010   ONSET DATE: 11/10/23  REFERRING DIAG: CVA  THERAPY DIAG:  Muscle weakness (generalized)  Other lack of coordination  Rationale for Evaluation and Treatment:  Rehabilitation  SUBJECTIVE:  SUBJECTIVE STATEMENT: Pt reports doing well today, and although back pain continues to linger, pt manages pain with heat and rest. Pt accompanied by: self and significant other  PERTINENT HISTORY: 89 year old male who was admitted to hospital on 11/10/23 for management of unstable angina after pt developed clumsiness of the left arm after cardiac catheterization 7/7, workup for TIA. Per neurology note symptoms secondary to relative cerebral hypoperfusion in the setting of heart disease and right carotid artery and M1 stenosis. PMhx: PAF on Eliquis , HTN, HLD, prediabetes, OSA on CPAP, dominant left vertebral artery occlusion, TIA, SSS s/p PPM, BPH, renal CA, lumbar fusion, A-fib.  PRECAUTIONS: None  WEIGHT BEARING RESTRICTIONS: No  PAIN: 03/17/24: 2/10 thoracic spine Are you having pain? 4/10  back pain  FALLS: Has patient fallen in last 6 months? Yes. Number of falls 4  LIVING ENVIRONMENT: Lives with: lives with their spouse Lives in: House/apartment Stairs: Yes: Internal: 2 steps; none Has following equipment at home: Single point cane and Walker - 2 wheeled  PLOF: Independent with basic ADLs  PATIENT GOALS: to go to [facility] for more detailed neuro testing and return to function in LUE  OBJECTIVE:  Note: Objective measures were completed at Evaluation unless otherwise noted.  HAND DOMINANCE: Right  ADLs: Overall ADLs: MIN A Transfers/ambulation related to ADLs: Eating: IND - uses dominant R Grooming: SETUP UB Dressing: SETUP - buttons assist LB Dressing: MIN - hiking pants  Toileting: IND Bathing: IND Tub Shower transfers: IND Equipment: none  IADLs: Shopping: spouse completes as needed Light housekeeping: spouse completes as needed Meal Prep: assist from spouse Community mobility: SPC/RW as needed Medication management: IND Landscape architect: IND Handwriting: 100% legible R hand  MOBILITY STATUS: Independent and Hx of  falls  POSTURE COMMENTS:  rounded shoulders and forward head Sitting balance: Sits without UE support up to 30 sec  ACTIVITY TOLERANCE: Activity tolerance: IND - tolerates 20 min standing activities   FUNCTIONAL OUTCOME MEASURES:    UPPER EXTREMITY ROM:   BUE AROM WFL   UPPER EXTREMITY MMT:     MMT Right eval Left eval  Shoulder flexion    Shoulder abduction    Shoulder adduction    Shoulder extension    Shoulder internal rotation    Shoulder external rotation    Middle trapezius  Lower trapezius    Elbow flexion    Elbow extension    Wrist flexion    Wrist extension    Wrist ulnar deviation    Wrist radial deviation    Wrist pronation    Wrist supination    (Blank rows = not tested)  HAND FUNCTION: Grip strength: Right: 76 lbs; Left: 55 lbs, Lateral pinch: Right: 15 lbs, Left: 11 lbs, and 3 point pinch: Right: 21 lbs, Left: 14 lbs 03/08/24: Grip strength: Right: 85 lbs, Left: 63 lbs; Lateral pinch: Right: 24 lbs, Left: 13 lbs, and 3 point pinch: Right: 25 lbs, Left: 16 lbs   COORDINATION: 9 Hole Peg test: Right: 26 sec; Left: 10 sec 03/08/24: Left: 58 sec.   SENSATION:  12/17/23 Light touch: decreased sensation and numbness in L hand 5th digit Stereognosis:   Hot/Cold:   Proprioception: intact  EDEMA: mild swelling to L hand  COGNITION: Overall cognitive status: Within functional limits for tasks assessed  VISION: Subjective report: baseline Baseline vision: No visual deficits  VISION ASSESSMENT: WFL                                                                                                 TREATMENT DATE: 03/17/24  Moist heat applied to T-spine for pain management throughout session  Therapeutic Activity: -Facilitated L hand pinch strengthening and GMC by reaching toward a target; pt used wide base clothespins (yellow, red, green, blue) to place clips on/off a vertical and horizontal dowel.  Dowel position alternated to challenge forward  and lateral reaching patterns.  Pt practiced lumbrical grasp, lateral pinch, and 3 point pinch for above noted activity.  Therapeutic Exercise: -Isometric lumbrical strengthening grasping edge of table top, requiring manual blocking to prevent L 5th digit PIP from flexing -Reps of resisted MP extension of digits 2-5 -Manual resistance provided for reps of digit abd/add, progressing to isolated digit abd/add for the L 2nd and 3rd digit -Completed isolated digit abd between thumb and 2nd digit, digit 2-3, 3-4, and 4-5 using light resistance yellow rubber band  -HEP review with written handouts given for above noted exercises  PATIENT EDUCATION: Education details: HEP Person educated: Patient Education method: Explanation and Verbal cues Education comprehension: verbalized understanding  HOME EXERCISE PROGRAM: Blue theraputty [facility] - coins at tabletop Intrinsic hand strengthening  GOALS: Goals reviewed with patient? Yes  SHORT TERM GOALS: Target date: 04/19/24  Pt will IND complete HEP for functional strenghening of non-dominant LUE. Baseline: not initiated; 03/08/24: Min vc with recently added exercises (intrinsic hand strengthening) Goal status: in progress   LONG TERM GOALS: Target date: 05/31/24  Pt will increase L lateral pinch strength by 5 # of force to securely hold a wrench during leisure activities. Baseline: L pinch 11#; 03/08/24: L 13# Goal status: in progress  2.  Pt will increase L grip strength by 15# to wash dishes without dropping items.  Baseline: L grip 55#; 03/08/24: L grip 63# (pt reports having to keep eyes on dishes to avoid dropping any d/t sensory deficits) Goal status: in progress   3.  Pt will  improve LUE coordination by 30 seconds to independently manipulate fasteners during dressing tasks. Baseline: L 1'10; 03/08/24: L 58 sec  Goal status: in progress  4.  Pt will improve dynamic seated balance to don B shoes independently on edge of bed without loss of  balance. Baseline: requires chair with armrest to don shoes with standby assist; 03/08/24: Consistently sits in chair with arm rests (without SBA) for increased stability Goal status: d/c  5.  Pt will increase L hand lumbrical strength in order to securely hold and stabilize flat objects (notebook) in L hand.  Baseline: 03/08/24: L hand 4th and 5th digit PIPs flex and require manual joint blocking or oval 8 splint to prevent flexion.     Goal status: New  ASSESSMENT: CLINICAL IMPRESSION: Continued focus on intrinsic strengthening.  Pt continues to require active assist for L hand digit abd/add in the 2nd and 3rd digit only.  Pt able to sustain lumbrical grasp using up to 6# wide base clothespins (blue) without assist to maintain PIP ext in the 4th digit.  5th digit continues to flex at the PIP joint without manual blocking.  Pt continues to utilize oval 8 splint at home to facilitate joint blocking.  Forward and lateral reaching continue to show improved control, though still mildly ataxic.  Pt can manage black most resistive clothespins (10#) using a lateral pinch only, but not consistently.  Pt continues to benefit from OT services to work on L hand strength and [facility] skills to improve functional independence of ADL/IADL tasks at home.   PERFORMANCE DEFICITS: in functional skills including ADLs, IADLs, coordination, dexterity, sensation, and edema and psychosocial skills including environmental adaptation.   IMPAIRMENTS: are limiting patient from ADLs, IADLs, and leisure.   CO-MORBIDITIES: may have co-morbidities  that affects occupational performance. Patient will benefit from skilled OT to address above impairments and improve overall function.  MODIFICATION OR ASSISTANCE TO COMPLETE EVALUATION: No modification of tasks or assist necessary to complete an evaluation.  OT OCCUPATIONAL PROFILE AND HISTORY: Problem focused assessment: Including review of records relating to presenting  problem.  CLINICAL DECISION MAKING: LOW - limited treatment options, no task modification necessary  REHAB POTENTIAL: Good  EVALUATION COMPLEXITY: Moderate    PLAN:  OT FREQUENCY: 1-2x/week  OT DURATION: 12 weeks  PLANNED INTERVENTIONS: 97168 OT Re-evaluation, 97535 self care/ADL training, 02889 therapeutic exercise, 97530 therapeutic activity, and 97112 neuromuscular re-education  RECOMMENDED OTHER SERVICES: PT  CONSULTED AND AGREED WITH PLAN OF CARE: Patient and family member/caregiver  PLAN FOR NEXT SESSION: see above  Inocente Blazing, MS, OTR/L  03/20/2024, 7:29 PM

## 2024-03-22 ENCOUNTER — Ambulatory Visit

## 2024-03-22 DIAGNOSIS — R278 Other lack of coordination: Secondary | ICD-10-CM

## 2024-03-22 DIAGNOSIS — M6281 Muscle weakness (generalized): Secondary | ICD-10-CM | POA: Diagnosis not present

## 2024-03-22 NOTE — Therapy (Unsigned)
 OUTPATIENT OCCUPATIONAL THERAPY NEURO TREATMENT NOTE  Patient Name: Thomas [name] MRN: [id number] DOB:[birth year], 89 y.o., male Today's Date: 03/22/2024  PCP: Charlie Forte REFERRING PROVIDER: [name]  END OF SESSION:  OT End of Session - 03/22/24 1257     Visit Number 14    Number of Visits 34    Date for Recertification  05/31/24    Authorization Time Period Reporting period beginning 03/08/24    OT Start Time 1151    OT Stop Time 1235    OT Time Calculation (min) 44 min    Activity Tolerance Patient tolerated treatment well    Behavior During Therapy [facility] for tasks assessed/performed         Past Medical History:  Diagnosis Date   Aortic insufficiency    a. 07/2023 Echo: Mod AI; b. 09/2023 Echo: Mild-mod AI.   Aortic valve disorders    Arthritis    Basal cell carcinoma    face, nose left shoulder, left arm (06/19/2016)   Basal cell carcinoma 09/13/2020   right temple   BBB (bundle branch block)    hx right   CAD (coronary artery disease)    a. 10/2017 Cath: mod, nonobs dzs; b. 02/2021 Cath: mod, nonobs dzs; c. 11/2022 Cath: LAD 30ost/p, 9m, D1 min irregs, LCX 65p/m, OM1 min irregs, OM2 40, OM3 nl, RCA 30p, 72m, 40d w/ 40 in side branch-->Med rx; d. 11/2023 PCI: LAD 90ost/p (3.5x15 Onyx Frontier DES). Otw stable anatomy.   Chronic back pain    neck, thoracic, lower back (06/19/2016)   CKD (chronic kidney disease), stage III (HCC)    Complete heart block (HCC) 06/2016   a. 06/2016 s/p MDT PPM; b. 01/2023 s/p upgrade to MDT CRT-P (ser # [id number]).   GERD (gastroesophageal reflux disease)    Gout    Heart block    I've had type I, II Wenke before now (06/19/2016)   History of gout    History of hiatal hernia    self dx'd (06/19/2016)   Hyperlipidemia    Hypertension    Lyme disease    dx'd by me 2003; cx's showed dx 08/2015   Migraine    3-4/year (06/19/2016)   Mitral regurgitation    a. 07/2023 Echo: Mod MR.   NICM (nonischemic cardiomyopathy - in  setting of RV pacing) (HCC)    a. 02/2019 Echo: EF 45-50%; b. 10/2022 Echo: EF 30-35%; c. 01/2023 CRT-P upgrade; e. 07/2023 Echo: EF 40-45%, glob HK; f. 10/2023 Echo: EF of 55 to 60% with grade 1 DD, nl RV fxn, triv MR, mild to mod AI, and mildly dilated ascending aorta at 42 mm.   PAF (paroxysmal atrial fibrillation) (HCC)    a. CHA2DS2VASc = 7--> eliquis ; b. On tikosyn .   Paroxysmal atrial flutter (HCC)    Presence of permanent cardiac pacemaker    PVC's (premature ventricular contractions)    Renal cancer, left (HCC) 2006   S/P cryotherapy   Spinal stenosis    cervical, 1 thoracic, lumbar (06/19/2016)   Squamous carcinoma    face, nose left shoulder, left arm (06/19/2016)   Stroke (HCC)    TIA (transient ischemic attack) 06/14/2016   I'm not sure that's what it was (06/25/2016)   Visit for monitoring Tikosyn  therapy 09/09/2017   Past Surgical History:  Procedure Laterality Date   ANKLE FRACTURE SURGERY Right 1967   BACK SURGERY  05/07/2020   BASAL CELL CARCINOMA EXCISION     face, nose left shoulder, left arm  BIOPSY PROSTATE  2001 & 2003   BIV UPGRADE N/A 01/10/2023   Procedure: BIV UPGRADE;  Surgeon: Fernande Elspeth BROCKS, MD;  Location: [facility] INVASIVE CV LAB;  Service: Cardiovascular;  Laterality: N/A;   CARDIAC CATHETERIZATION  1990's   CARDIOVERSION N/A 09/11/2017   Procedure: CARDIOVERSION;  Surgeon: Pietro Redell RAMAN, MD;  Location: [facility] ENDOSCOPY;  Service: Cardiovascular;  Laterality: N/A;   CORONARY STENT INTERVENTION N/A 11/10/2023   Procedure: CORONARY STENT INTERVENTION;  Surgeon: Darron Deatrice LABOR, MD;  Location: [facility] INVASIVE CV LAB;  Service: Cardiovascular;  Laterality: N/A;   FRACTURE SURGERY     HOLEP-LASER ENUCLEATION OF THE PROSTATE WITH MORCELLATION N/A 07/10/2020   Procedure: HOLEP-LASER ENUCLEATION OF THE PROSTATE WITH MORCELLATION;  Surgeon: Penne Knee, MD;  Location: [facility] ORS;  Service: Urology;  Laterality: N/A;   INGUINAL HERNIA REPAIR Left 2012   INSERT /  REPLACE / REMOVE PACEMAKER  06/19/2016   LAPAROSCOPIC ABLATION RENAL MASS     LEFT HEART CATH AND CORONARY ANGIOGRAPHY Left 10/23/2017   Procedure: LEFT HEART CATH AND CORONARY ANGIOGRAPHY;  Surgeon: Darron Deatrice LABOR, MD;  Location: [facility] INVASIVE CV LAB;  Service: Cardiovascular;  Laterality: Left;   LEFT HEART CATH AND CORONARY ANGIOGRAPHY Left 02/26/2021   Procedure: LEFT HEART CATH AND CORONARY ANGIOGRAPHY;  Surgeon: Darron Deatrice LABOR, MD;  Location: [facility] INVASIVE CV LAB;  Service: Cardiovascular;  Laterality: Left;   LEFT HEART CATH AND CORONARY ANGIOGRAPHY Left 11/10/2023   Procedure: LEFT HEART CATH AND CORONARY ANGIOGRAPHY;  Surgeon: Darron Deatrice LABOR, MD;  Location: [facility] INVASIVE CV LAB;  Service: Cardiovascular;  Laterality: Left;   PACEMAKER IMPLANT N/A 06/19/2016   Procedure: Pacemaker Implant;  Surgeon: Elspeth BROCKS Fernande, MD;  Location: [facility] INVASIVE CV LAB;  Service: Cardiovascular;  Laterality: N/A;   pacemasker     PROSTATE SURGERY     RIGHT/LEFT HEART CATH AND CORONARY ANGIOGRAPHY Bilateral 11/25/2022   Procedure: RIGHT/LEFT HEART CATH AND CORONARY ANGIOGRAPHY;  Surgeon: Darron Deatrice LABOR, MD;  Location: [facility] INVASIVE CV LAB;  Service: Cardiovascular;  Laterality: Bilateral;   SQUAMOUS CELL CARCINOMA EXCISION     face, nose left shoulder, left arm   TONSILLECTOMY AND ADENOIDECTOMY     Patient Active Problem List   Diagnosis Date Noted   Angina pectoris 11/10/2023   Weakness of left foot 10/29/2023   CKD stage 3b, GFR 30-44 ml/min (HCC) 10/29/2023   Obstructive sleep apnea 10/29/2023   Combined systolic and diastolic congestive heart failure (HCC) 10/29/2023   AKI (acute kidney injury) 04/29/2023   UTI (urinary tract infection) 04/29/2023   Chronic systolic heart failure (HCC) 11/25/2022   Sick sinus syndrome (HCC) 02/26/2022   Closed fracture of second lumbar vertebra (HCC) 09/26/2021   Dysphagia, unspecified 09/26/2021   Heart block 09/26/2021   Heartburn 09/26/2021    History of lumbar fusion 09/26/2021   Inflammation of sacroiliac joint 09/26/2021   Other symptoms and signs involving cognitive functions and awareness 09/26/2021   Prediabetes 09/26/2021   Low back pain 09/26/2021   Renal cell carcinoma (HCC) 09/26/2021   Essential hypertension 09/26/2021   Cerebrovascular disease 09/26/2021   Transient cerebral ischemic attack, unspecified 09/26/2021   Cerebrovascular disease, unspecified 09/26/2021   Transient ischemic attack 09/26/2021   Unstable angina (HCC)    Lumbar burst fracture (HCC) 05/04/2020   Hypogonadism male 10/10/2019   Persistent atrial fibrillation (HCC) 01/12/2019   Acute low back pain 11/04/2017   Abnormal screening cardiac CT    Visit for monitoring Tikosyn  therapy 09/09/2017  History of TIA (transient ischemic attack) 08/29/2016   Paroxysmal atrial fibrillation (HCC) 07/23/2016   CAD (coronary artery disease) 07/23/2016   Snores 06/27/2016   S/P placement of cardiac pacemaker 06/25/2016   Hemiparesis (HCC) 06/25/2016   Acute ischemic stroke (HCC)    Left-sided weakness    Hemisensory loss    Dysarthria    Stroke-like symptoms    Complete heart block (HCC)    Pain in thoracic spine    Orthostasis    Lethargy    Vertebral artery occlusion, left    TIA (transient ischemic attack) 06/14/2016   Arthritis 02/06/2015   Esophageal reflux 02/06/2015   Arthritis urica 02/06/2015   Cannot sleep 02/06/2015   Arthritis, degenerative 02/06/2015   Adenocarcinoma, renal cell (HCC) 02/06/2015   Benign prostatic hyperplasia with lower urinary tract symptoms 11/21/2014   Personal history of other malignant neoplasm of kidney 11/21/2014   History of Lyme disease 05/06/2014   Palpitations 12/31/2013   Hyperlipidemia 11/02/2010   HYPERTENSION, BENIGN 04/16/2010   ONSET DATE: 11/10/23  REFERRING DIAG: CVA  THERAPY DIAG:  Muscle weakness (generalized)  Other lack of coordination  Rationale for Evaluation and Treatment:  Rehabilitation  SUBJECTIVE:  SUBJECTIVE STATEMENT: Pt reports back pain is steadily improving.  Pt accompanied by: self and significant other  PERTINENT HISTORY: 89 year old male who was admitted to hospital on 11/10/23 for management of unstable angina after pt developed clumsiness of the left arm after cardiac catheterization 7/7, workup for TIA. Per neurology note symptoms secondary to relative cerebral hypoperfusion in the setting of heart disease and right carotid artery and M1 stenosis. PMhx: PAF on Eliquis , HTN, HLD, prediabetes, OSA on CPAP, dominant left vertebral artery occlusion, TIA, SSS s/p PPM, BPH, renal CA, lumbar fusion, A-fib.  PRECAUTIONS: None  WEIGHT BEARING RESTRICTIONS: No  PAIN: 03/22/24: mild pain with activity, no pain at rest Are you having pain? 4/10  back pain  FALLS: Has patient fallen in last 6 months? Yes. Number of falls 4  LIVING ENVIRONMENT: Lives with: lives with their spouse Lives in: House/apartment Stairs: Yes: Internal: 2 steps; none Has following equipment at home: Single point cane and Walker - 2 wheeled  PLOF: Independent with basic ADLs  PATIENT GOALS: to go to [facility] for more detailed neuro testing and return to function in LUE  OBJECTIVE:  Note: Objective measures were completed at Evaluation unless otherwise noted.  HAND DOMINANCE: Right  ADLs: Overall ADLs: MIN A Transfers/ambulation related to ADLs: Eating: IND - uses dominant R Grooming: SETUP UB Dressing: SETUP - buttons assist LB Dressing: MIN - hiking pants  Toileting: IND Bathing: IND Tub Shower transfers: IND Equipment: none  IADLs: Shopping: spouse completes as needed Light housekeeping: spouse completes as needed Meal Prep: assist from spouse Community mobility: SPC/RW as needed Medication management: IND Landscape architect: IND Handwriting: 100% legible R hand  MOBILITY STATUS: Independent and Hx of falls  POSTURE COMMENTS:  rounded shoulders and  forward head Sitting balance: Sits without UE support up to 30 sec  ACTIVITY TOLERANCE: Activity tolerance: IND - tolerates 20 min standing activities   FUNCTIONAL OUTCOME MEASURES:    UPPER EXTREMITY ROM:   BUE AROM WFL   UPPER EXTREMITY MMT:     MMT Right eval Left eval  Shoulder flexion    Shoulder abduction    Shoulder adduction    Shoulder extension    Shoulder internal rotation    Shoulder external rotation    Middle trapezius    Lower trapezius  Elbow flexion    Elbow extension    Wrist flexion    Wrist extension    Wrist ulnar deviation    Wrist radial deviation    Wrist pronation    Wrist supination    (Blank rows = not tested)  HAND FUNCTION: Grip strength: Right: 76 lbs; Left: 55 lbs, Lateral pinch: Right: 15 lbs, Left: 11 lbs, and 3 point pinch: Right: 21 lbs, Left: 14 lbs 03/08/24: Grip strength: Right: 85 lbs, Left: 63 lbs; Lateral pinch: Right: 24 lbs, Left: 13 lbs, and 3 point pinch: Right: 25 lbs, Left: 16 lbs   COORDINATION: 9 Hole Peg test: Right: 26 sec; Left: 10 sec 03/08/24: Left: 58 sec.   SENSATION:  12/17/23 Light touch: decreased sensation and numbness in L hand 5th digit Stereognosis:   Hot/Cold:   Proprioception: intact  EDEMA: mild swelling to L hand  COGNITION: Overall cognitive status: Within functional limits for tasks assessed  VISION: Subjective report: baseline Baseline vision: No visual deficits  VISION ASSESSMENT: WFL                                                                                                 TREATMENT DATE: 03/22/24  Moist heat applied to T-spine for pain management throughout session  Therapeutic Activity: -L grip strengthening and GMC: Pt placed jumbo pegs into pegboard placed on an incline, and removed with hand gripper set at 23.4# x2 trials.  Hand gripper padded with coban wrap to increase comfort while gripping. -Facilitated L hand FMC/dexterity skills and GMC reaching toward a  target; Pt used L hand to pick up narrow 2 wooden sticks from non-skid Dycem on table top, and placed into pegboard with arm unsupported from table top, reaching into forward and lateral planes of movement.  Challenged a variety of forearm positions, including neutral, supinated, and pronated while pushing sticks through pegboard.  Practiced using only tip of L 2nd digit to push sticks through pegboard to further challenge GMC with occasional min vc to reduce substitution patterns (arm remaining fully unsupported from table top or pegboard).   Therapeutic Exercise: -Lumbrical strengthening grasping edge of table top, requiring manual blocking to prevent L 4th and 5th digit PIP from flexing while pinching yellow and red clothespins -Lateral pinching with reps of placing and removing black clothespins from folder, 3 point pinch using green and blue clips, and lumbrical strengthening with yellow and red clips  -Isolated 4th and 5th digits in lumbrical grasp using yellow clothespins; manual assist to block PIP joints in these digits to maintain extension -Manual resistance provided for reps of thumb add  PATIENT EDUCATION: Education details: HEP Person educated: Patient Education method: Explanation and Verbal cues Education comprehension: verbalized understanding  HOME EXERCISE PROGRAM: Blue theraputty [facility] - coins at tabletop Intrinsic hand strengthening  GOALS: Goals reviewed with patient? Yes  SHORT TERM GOALS: Target date: 04/19/24  Pt will IND complete HEP for functional strenghening of non-dominant LUE. Baseline: not initiated; 03/08/24: Min vc with recently added exercises (intrinsic hand strengthening) Goal status: in progress   LONG TERM GOALS: Target date: 05/31/24  Pt will increase L lateral pinch strength by 5 # of force to securely hold a wrench during leisure activities. Baseline: L pinch 11#; 03/08/24: L 13# Goal status: in progress  2.  Pt will increase L grip strength by  15# to wash dishes without dropping items.  Baseline: L grip 55#; 03/08/24: L grip 63# (pt reports having to keep eyes on dishes to avoid dropping any d/t sensory deficits) Goal status: in progress   3.  Pt will improve LUE coordination by 30 seconds to independently manipulate fasteners during dressing tasks. Baseline: L 1'10; 03/08/24: L 58 sec  Goal status: in progress  4.  Pt will improve dynamic seated balance to don B shoes independently on edge of bed without loss of balance. Baseline: requires chair with armrest to don shoes with standby assist; 03/08/24: Consistently sits in chair with arm rests (without SBA) for increased stability Goal status: d/c  5.  Pt will increase L hand lumbrical strength in order to securely hold and stabilize flat objects (notebook) in L hand.  Baseline: 03/08/24: L hand 4th and 5th digit PIPs flex and require manual joint blocking or oval 8 splint to prevent flexion.     Goal status: New  ASSESSMENT: CLINICAL IMPRESSION: Pt notes improvement in isolated digit ext.  Reviewed instrinsic strengthening exes.  Pt notes improvement in isolated digit ext.  Reviewed resisted thumb abd to target muscle atrophy within webspace of thumb and IF.  5th digit continues to flex at the PIP joint without manual blocking during lumbrical strengthening.  Pt continues to utilize oval 8 splint at home to facilitate joint blocking.  Forward and lateral reaching continue to show improved control, though still mildly ataxic.  Pt required increased time to successfully pick up 2 sticks from dycem pad, but improved with high reps.  Pt continues to benefit from OT services to work on L hand strength and L [facility] skills to improve functional independence of ADL/IADL tasks at home.   PERFORMANCE DEFICITS: in functional skills including ADLs, IADLs, coordination, dexterity, sensation, and edema and psychosocial skills including environmental adaptation.   IMPAIRMENTS: are limiting patient from  ADLs, IADLs, and leisure.   CO-MORBIDITIES: may have co-morbidities  that affects occupational performance. Patient will benefit from skilled OT to address above impairments and improve overall function.  MODIFICATION OR ASSISTANCE TO COMPLETE EVALUATION: No modification of tasks or assist necessary to complete an evaluation.  OT OCCUPATIONAL PROFILE AND HISTORY: Problem focused assessment: Including review of records relating to presenting problem.  CLINICAL DECISION MAKING: LOW - limited treatment options, no task modification necessary  REHAB POTENTIAL: Good  EVALUATION COMPLEXITY: Moderate    PLAN:  OT FREQUENCY: 1-2x/week  OT DURATION: 12 weeks  PLANNED INTERVENTIONS: 97168 OT Re-evaluation, 97535 self care/ADL training, 02889 therapeutic exercise, 97530 therapeutic activity, and 97112 neuromuscular re-education  RECOMMENDED OTHER SERVICES: PT  CONSULTED AND AGREED WITH PLAN OF CARE: Patient and family member/caregiver  PLAN FOR NEXT SESSION: see above  Inocente Blazing, MS, OTR/L  03/22/2024, 12:58 PM

## 2024-03-24 ENCOUNTER — Ambulatory Visit

## 2024-03-24 DIAGNOSIS — M6281 Muscle weakness (generalized): Secondary | ICD-10-CM | POA: Diagnosis not present

## 2024-03-24 DIAGNOSIS — R278 Other lack of coordination: Secondary | ICD-10-CM

## 2024-03-24 NOTE — Therapy (Signed)
 OUTPATIENT OCCUPATIONAL THERAPY NEURO TREATMENT NOTE  Patient Name: Thomas [name] MRN: [id number] DOB:03-Aug-1932, 89 y.o., male Today's Date: 03/28/2024  PCP: Charlie Forte REFERRING PROVIDER: [name]  END OF SESSION:  OT End of Session - 03/24/24       Visit Number 15    Number of Visits 34     Date for Recertification  05/31/24     Authorization Time Period Reporting period beginning 03/08/24     OT Start Time 1145     OT Stop Time 1230     OT Time Calculation (min) 45 min     Activity Tolerance Patient tolerated treatment well     Behavior During Therapy [facility] for tasks assessed/performed    Past Medical History:  Diagnosis Date   Aortic insufficiency    a. 07/2023 Echo: Mod AI; b. 09/2023 Echo: Mild-mod AI.   Aortic valve disorders    Arthritis    Basal cell carcinoma    face, nose left shoulder, left arm (06/19/2016)   Basal cell carcinoma 09/13/2020   right temple   BBB (bundle branch block)    hx right   CAD (coronary artery disease)    a. 10/2017 Cath: mod, nonobs dzs; b. 02/2021 Cath: mod, nonobs dzs; c. 11/2022 Cath: LAD 30ost/p, 9m, D1 min irregs, LCX 65p/m, OM1 min irregs, OM2 40, OM3 nl, RCA 30p, 23m, 40d w/ 40 in side branch-->Med rx; d. 11/2023 PCI: LAD 90ost/p (3.5x15 Onyx Frontier DES). Otw stable anatomy.   Chronic back pain    neck, thoracic, lower back (06/19/2016)   CKD (chronic kidney disease), stage III (HCC)    Complete heart block (HCC) 06/2016   a. 06/2016 s/p MDT PPM; b. 01/2023 s/p upgrade to MDT CRT-P (ser # [id number]).   GERD (gastroesophageal reflux disease)    Gout    Heart block    I've had type I, II Wenke before now (06/19/2016)   History of gout    History of hiatal hernia    self dx'd (06/19/2016)   Hyperlipidemia    Hypertension    Lyme disease    dx'd by me 2003; cx's showed dx 08/2015   Migraine    3-4/year (06/19/2016)   Mitral regurgitation    a. 07/2023 Echo: Mod MR.   NICM (nonischemic cardiomyopathy - in  setting of RV pacing) (HCC)    a. 02/2019 Echo: EF 45-50%; b. 10/2022 Echo: EF 30-35%; c. 01/2023 CRT-P upgrade; e. 07/2023 Echo: EF 40-45%, glob HK; f. 10/2023 Echo: EF of 55 to 60% with grade 1 DD, nl RV fxn, triv MR, mild to mod AI, and mildly dilated ascending aorta at 42 mm.   PAF (paroxysmal atrial fibrillation) (HCC)    a. CHA2DS2VASc = 7--> eliquis ; b. On tikosyn .   Paroxysmal atrial flutter (HCC)    Presence of permanent cardiac pacemaker    PVC's (premature ventricular contractions)    Renal cancer, left (HCC) 2006   S/P cryotherapy   Spinal stenosis    cervical, 1 thoracic, lumbar (06/19/2016)   Squamous carcinoma    face, nose left shoulder, left arm (06/19/2016)   Stroke (HCC)    TIA (transient ischemic attack) 06/14/2016   I'm not sure that's what it was (06/25/2016)   Visit for monitoring Tikosyn  therapy 09/09/2017   Past Surgical History:  Procedure Laterality Date   ANKLE FRACTURE SURGERY Right 1967   BACK SURGERY  05/07/2020   BASAL CELL CARCINOMA EXCISION     face, nose left shoulder,  left arm   BIOPSY PROSTATE  2001 & 2003   BIV UPGRADE N/A 01/10/2023   Procedure: BIV UPGRADE;  Surgeon: Fernande Elspeth BROCKS, MD;  Location: [facility] INVASIVE CV LAB;  Service: Cardiovascular;  Laterality: N/A;   CARDIAC CATHETERIZATION  1990's   CARDIOVERSION N/A 09/11/2017   Procedure: CARDIOVERSION;  Surgeon: Pietro Redell RAMAN, MD;  Location: [facility] ENDOSCOPY;  Service: Cardiovascular;  Laterality: N/A;   CORONARY STENT INTERVENTION N/A 11/10/2023   Procedure: CORONARY STENT INTERVENTION;  Surgeon: Darron Deatrice LABOR, MD;  Location: [facility] INVASIVE CV LAB;  Service: Cardiovascular;  Laterality: N/A;   FRACTURE SURGERY     HOLEP-LASER ENUCLEATION OF THE PROSTATE WITH MORCELLATION N/A 07/10/2020   Procedure: HOLEP-LASER ENUCLEATION OF THE PROSTATE WITH MORCELLATION;  Surgeon: Penne Knee, MD;  Location: [facility] ORS;  Service: Urology;  Laterality: N/A;   INGUINAL HERNIA REPAIR Left 2012   INSERT /  REPLACE / REMOVE PACEMAKER  06/19/2016   LAPAROSCOPIC ABLATION RENAL MASS     LEFT HEART CATH AND CORONARY ANGIOGRAPHY Left 10/23/2017   Procedure: LEFT HEART CATH AND CORONARY ANGIOGRAPHY;  Surgeon: Darron Deatrice LABOR, MD;  Location: [facility] INVASIVE CV LAB;  Service: Cardiovascular;  Laterality: Left;   LEFT HEART CATH AND CORONARY ANGIOGRAPHY Left 02/26/2021   Procedure: LEFT HEART CATH AND CORONARY ANGIOGRAPHY;  Surgeon: Darron Deatrice LABOR, MD;  Location: [facility] INVASIVE CV LAB;  Service: Cardiovascular;  Laterality: Left;   LEFT HEART CATH AND CORONARY ANGIOGRAPHY Left 11/10/2023   Procedure: LEFT HEART CATH AND CORONARY ANGIOGRAPHY;  Surgeon: Darron Deatrice LABOR, MD;  Location: [facility] INVASIVE CV LAB;  Service: Cardiovascular;  Laterality: Left;   PACEMAKER IMPLANT N/A 06/19/2016   Procedure: Pacemaker Implant;  Surgeon: Elspeth BROCKS Fernande, MD;  Location: [facility] INVASIVE CV LAB;  Service: Cardiovascular;  Laterality: N/A;   pacemasker     PROSTATE SURGERY     RIGHT/LEFT HEART CATH AND CORONARY ANGIOGRAPHY Bilateral 11/25/2022   Procedure: RIGHT/LEFT HEART CATH AND CORONARY ANGIOGRAPHY;  Surgeon: Darron Deatrice LABOR, MD;  Location: [facility] INVASIVE CV LAB;  Service: Cardiovascular;  Laterality: Bilateral;   SQUAMOUS CELL CARCINOMA EXCISION     face, nose left shoulder, left arm   TONSILLECTOMY AND ADENOIDECTOMY     Patient Active Problem List   Diagnosis Date Noted   Angina pectoris 11/10/2023   Weakness of left foot 10/29/2023   CKD stage 3b, GFR 30-44 ml/min (HCC) 10/29/2023   Obstructive sleep apnea 10/29/2023   Combined systolic and diastolic congestive heart failure (HCC) 10/29/2023   AKI (acute kidney injury) 04/29/2023   UTI (urinary tract infection) 04/29/2023   Chronic systolic heart failure (HCC) 11/25/2022   Sick sinus syndrome (HCC) 02/26/2022   Closed fracture of second lumbar vertebra (HCC) 09/26/2021   Dysphagia, unspecified 09/26/2021   Heart block 09/26/2021   Heartburn 09/26/2021    History of lumbar fusion 09/26/2021   Inflammation of sacroiliac joint 09/26/2021   Other symptoms and signs involving cognitive functions and awareness 09/26/2021   Prediabetes 09/26/2021   Low back pain 09/26/2021   Renal cell carcinoma (HCC) 09/26/2021   Essential hypertension 09/26/2021   Cerebrovascular disease 09/26/2021   Transient cerebral ischemic attack, unspecified 09/26/2021   Cerebrovascular disease, unspecified 09/26/2021   Transient ischemic attack 09/26/2021   Unstable angina (HCC)    Lumbar burst fracture (HCC) 05/04/2020   Hypogonadism male 10/10/2019   Persistent atrial fibrillation (HCC) 01/12/2019   Acute low back pain 11/04/2017   Abnormal screening cardiac CT    Visit for  monitoring Tikosyn  therapy 09/09/2017   History of TIA (transient ischemic attack) 08/29/2016   Paroxysmal atrial fibrillation (HCC) 07/23/2016   CAD (coronary artery disease) 07/23/2016   Snores 06/27/2016   S/P placement of cardiac pacemaker 06/25/2016   Hemiparesis (HCC) 06/25/2016   Acute ischemic stroke (HCC)    Left-sided weakness    Hemisensory loss    Dysarthria    Stroke-like symptoms    Complete heart block (HCC)    Pain in thoracic spine    Orthostasis    Lethargy    Vertebral artery occlusion, left    TIA (transient ischemic attack) 06/14/2016   Arthritis 02/06/2015   Esophageal reflux 02/06/2015   Arthritis urica 02/06/2015   Cannot sleep 02/06/2015   Arthritis, degenerative 02/06/2015   Adenocarcinoma, renal cell (HCC) 02/06/2015   Benign prostatic hyperplasia with lower urinary tract symptoms 11/21/2014   Personal history of other malignant neoplasm of kidney 11/21/2014   History of Lyme disease 05/06/2014   Palpitations 12/31/2013   Hyperlipidemia 11/02/2010   HYPERTENSION, BENIGN 04/16/2010   ONSET DATE: 11/10/23  REFERRING DIAG: CVA  THERAPY DIAG:  Muscle weakness (generalized)  Other lack of coordination  Rationale for Evaluation and Treatment:  Rehabilitation  SUBJECTIVE:  SUBJECTIVE STATEMENT: Pt reports likely needing to cancel OT session next week d/t uncertain holiday plans. Pt accompanied by: self and significant other  PERTINENT HISTORY: 89 year old male who was admitted to hospital on 11/10/23 for management of unstable angina after pt developed clumsiness of the left arm after cardiac catheterization 7/7, workup for TIA. Per neurology note symptoms secondary to relative cerebral hypoperfusion in the setting of heart disease and right carotid artery and M1 stenosis. PMhx: PAF on Eliquis , HTN, HLD, prediabetes, OSA on CPAP, dominant left vertebral artery occlusion, TIA, SSS s/p PPM, BPH, renal CA, lumbar fusion, A-fib.  PRECAUTIONS: None  WEIGHT BEARING RESTRICTIONS: No  PAIN: 03/24/24: mild back pain with activity, no pain at rest Are you having pain? 4/10  back pain  FALLS: Has patient fallen in last 6 months? Yes. Number of falls 4  LIVING ENVIRONMENT: Lives with: lives with their spouse Lives in: House/apartment Stairs: Yes: Internal: 2 steps; none Has following equipment at home: Single point cane and Walker - 2 wheeled  PLOF: Independent with basic ADLs  PATIENT GOALS: to go to [facility] for more detailed neuro testing and return to function in LUE  OBJECTIVE:  Note: Objective measures were completed at Evaluation unless otherwise noted.  HAND DOMINANCE: Right  ADLs: Overall ADLs: MIN A Transfers/ambulation related to ADLs: Eating: IND - uses dominant R Grooming: SETUP UB Dressing: SETUP - buttons assist LB Dressing: MIN - hiking pants  Toileting: IND Bathing: IND Tub Shower transfers: IND Equipment: none  IADLs: Shopping: spouse completes as needed Light housekeeping: spouse completes as needed Meal Prep: assist from spouse Community mobility: SPC/RW as needed Medication management: IND Landscape architect: IND Handwriting: 100% legible R hand  MOBILITY STATUS: Independent and Hx of  falls  POSTURE COMMENTS:  rounded shoulders and forward head Sitting balance: Sits without UE support up to 30 sec  ACTIVITY TOLERANCE: Activity tolerance: IND - tolerates 20 min standing activities   FUNCTIONAL OUTCOME MEASURES:    UPPER EXTREMITY ROM:   BUE AROM WFL   UPPER EXTREMITY MMT:     MMT Right eval Left eval  Shoulder flexion    Shoulder abduction    Shoulder adduction    Shoulder extension    Shoulder internal rotation    Shoulder  external rotation    Middle trapezius    Lower trapezius    Elbow flexion    Elbow extension    Wrist flexion    Wrist extension    Wrist ulnar deviation    Wrist radial deviation    Wrist pronation    Wrist supination    (Blank rows = not tested)  HAND FUNCTION: Grip strength: Right: 76 lbs; Left: 55 lbs, Lateral pinch: Right: 15 lbs, Left: 11 lbs, and 3 point pinch: Right: 21 lbs, Left: 14 lbs 03/08/24: Grip strength: Right: 85 lbs, Left: 63 lbs; Lateral pinch: Right: 24 lbs, Left: 13 lbs, and 3 point pinch: Right: 25 lbs, Left: 16 lbs   COORDINATION: 9 Hole Peg test: Right: 26 sec; Left: 10 sec 03/08/24: Left: 58 sec.   SENSATION:  12/17/23 Light touch: decreased sensation and numbness in L hand 5th digit Stereognosis:   Hot/Cold:   Proprioception: intact  EDEMA: mild swelling to L hand  COGNITION: Overall cognitive status: Within functional limits for tasks assessed  VISION: Subjective report: baseline Baseline vision: No visual deficits  VISION ASSESSMENT: WFL                                                                                                 TREATMENT DATE: 03/24/24  Moist heat applied to T-spine for pain management throughout session  Therapeutic Activity: -Facilitated L hand FMC/dexterity skills and GMC reaching toward a target; Pt used L hand to pick up narrow 2 wooden sticks from non-skid Dycem on table top, and placed into pegboard with arm unsupported from table top, reaching into  forward and lateral planes of movement.  Challenged a variety of forearm positions, including neutral, supinated, and pronated while pushing sticks through pegboard.  Practiced using only tip of L 2nd digit to push sticks through pegboard to further challenge GMC with occasional min vc to reduce substitution patterns (arm remaining fully unsupported from table top or pegboard).  -Facilitated L hand FMC/dexterity skills working to pick up flat objects from table top (washers), store in hand, translatory movements to move washers from palm to fingertips in prep for discarding from hand 1 by 1.  Therapeutic Exercise: -Isolated digit ext, abd/add (finger taps) -Lateral pinching with reps of placing and removing black clothespins from folder, 3 point pinch using green and blue clips, and lumbrical strengthening with yellow and red clips  -Isolated 4th and 5th digits in lumbrical grasp using yellow clothespins; manual assist to block PIP joints in these digits to maintain extension -Manual resistance provided for reps of thumb, IF, and LF abd/add  PATIENT EDUCATION: Education details: HEP Person educated: Patient Education method: Explanation and Verbal cues Education comprehension: verbalized understanding  HOME EXERCISE PROGRAM: Blue theraputty [facility] - coins at tabletop Intrinsic hand strengthening  GOALS: Goals reviewed with patient? Yes  SHORT TERM GOALS: Target date: 04/19/24  Pt will IND complete HEP for functional strenghening of non-dominant LUE. Baseline: not initiated; 03/08/24: Min vc with recently added exercises (intrinsic hand strengthening) Goal status: in progress   LONG TERM GOALS: Target date: 05/31/24  Pt will  increase L lateral pinch strength by 5 # of force to securely hold a wrench during leisure activities. Baseline: L pinch 11#; 03/08/24: L 13# Goal status: in progress  2.  Pt will increase L grip strength by 15# to wash dishes without dropping items.  Baseline: L  grip 55#; 03/08/24: L grip 63# (pt reports having to keep eyes on dishes to avoid dropping any d/t sensory deficits) Goal status: in progress   3.  Pt will improve LUE coordination by 30 seconds to independently manipulate fasteners during dressing tasks. Baseline: L 1'10; 03/08/24: L 58 sec  Goal status: in progress  4.  Pt will improve dynamic seated balance to don B shoes independently on edge of bed without loss of balance. Baseline: requires chair with armrest to don shoes with standby assist; 03/08/24: Consistently sits in chair with arm rests (without SBA) for increased stability Goal status: d/c  5.  Pt will increase L hand lumbrical strength in order to securely hold and stabilize flat objects (notebook) in L hand.  Baseline: 03/08/24: L hand 4th and 5th digit PIPs flex and require manual joint blocking or oval 8 splint to prevent flexion.     Goal status: New  ASSESSMENT: CLINICAL IMPRESSION:  Continued review of intrinsic strengthening.  Pt feels his hand may be stiff from overuse, or feels he's maybe having a set back after observing less active digit abduction today, though no significant changes observed by OT.  Pt tolerated all exercises well, and continues to present with muscle atrophy within the webspace of the thumb and IF.  5th digit continues to flex at the PIP joint without manual blocking during lumbrical strengthening.  Pt able to pick up washers from table top without non-skid surface, but requires increased time d/t the added challenge of sensory impairment in the L hand digits.  Translatory movements within the L hand require some substitution patterns with shaking hand to move objects, but L thumb is becoming more capable of assisting to move objects within the palm.  Pt continues to benefit from OT services to work on L hand strength and L [facility] skills to improve functional independence of ADL/IADL tasks at home.   PERFORMANCE DEFICITS: in functional skills including ADLs,  IADLs, coordination, dexterity, sensation, and edema and psychosocial skills including environmental adaptation.   IMPAIRMENTS: are limiting patient from ADLs, IADLs, and leisure.   CO-MORBIDITIES: may have co-morbidities  that affects occupational performance. Patient will benefit from skilled OT to address above impairments and improve overall function.  MODIFICATION OR ASSISTANCE TO COMPLETE EVALUATION: No modification of tasks or assist necessary to complete an evaluation.  OT OCCUPATIONAL PROFILE AND HISTORY: Problem focused assessment: Including review of records relating to presenting problem.  CLINICAL DECISION MAKING: LOW - limited treatment options, no task modification necessary  REHAB POTENTIAL: Good  EVALUATION COMPLEXITY: Moderate    PLAN:  OT FREQUENCY: 1-2x/week  OT DURATION: 12 weeks  PLANNED INTERVENTIONS: 97168 OT Re-evaluation, 97535 self care/ADL training, 02889 therapeutic exercise, 97530 therapeutic activity, and 97112 neuromuscular re-education  RECOMMENDED OTHER SERVICES: PT  CONSULTED AND AGREED WITH PLAN OF CARE: Patient and family member/caregiver  PLAN FOR NEXT SESSION: see above  Inocente Blazing, MS, OTR/L  03/28/2024, 6:25 PM

## 2024-03-29 ENCOUNTER — Ambulatory Visit: Admitting: Occupational Therapy

## 2024-03-29 ENCOUNTER — Ambulatory Visit: Admitting: Physical Therapy

## 2024-03-31 ENCOUNTER — Ambulatory Visit

## 2024-04-05 ENCOUNTER — Ambulatory Visit

## 2024-04-07 ENCOUNTER — Ambulatory Visit: Attending: Cardiovascular Disease

## 2024-04-07 ENCOUNTER — Ambulatory Visit

## 2024-04-07 DIAGNOSIS — M6281 Muscle weakness (generalized): Secondary | ICD-10-CM | POA: Diagnosis present

## 2024-04-07 DIAGNOSIS — R278 Other lack of coordination: Secondary | ICD-10-CM | POA: Insufficient documentation

## 2024-04-07 NOTE — Therapy (Signed)
 OUTPATIENT OCCUPATIONAL THERAPY NEURO TREATMENT NOTE  Patient Name: Thomas [name] MRN: [id number] DOB:01-21-1933, 89 y.o., male Today's Date: 04/11/2024  PCP: Charlie Forte REFERRING PROVIDER: [name]  END OF SESSION:  OT End of Session - 04/11/24 1539     Visit Number 16    Number of Visits 34    Date for Recertification  05/31/24    Authorization Time Period Reporting period beginning 03/08/24    OT Start Time 1154    OT Stop Time 1235    OT Time Calculation (min) 41 min    Activity Tolerance Patient tolerated treatment well    Behavior During Therapy [facility] for tasks assessed/performed         Past Medical History:  Diagnosis Date   Aortic insufficiency    a. 07/2023 Echo: Mod AI; b. 09/2023 Echo: Mild-mod AI.   Aortic valve disorders    Arthritis    Basal cell carcinoma    face, nose left shoulder, left arm (06/19/2016)   Basal cell carcinoma 09/13/2020   right temple   BBB (bundle branch block)    hx right   CAD (coronary artery disease)    a. 10/2017 Cath: mod, nonobs dzs; b. 02/2021 Cath: mod, nonobs dzs; c. 11/2022 Cath: LAD 30ost/p, 54m, D1 min irregs, LCX 65p/m, OM1 min irregs, OM2 40, OM3 nl, RCA 30p, 12m, 40d w/ 40 in side branch-->Med rx; d. 11/2023 PCI: LAD 90ost/p (3.5x15 Onyx Frontier DES). Otw stable anatomy.   Chronic back pain    neck, thoracic, lower back (06/19/2016)   CKD (chronic kidney disease), stage III (HCC)    Complete heart block (HCC) 06/2016   a. 06/2016 s/p MDT PPM; b. 01/2023 s/p upgrade to MDT CRT-P (ser # [id number]).   GERD (gastroesophageal reflux disease)    Gout    Heart block    I've had type I, II Wenke before now (06/19/2016)   History of gout    History of hiatal hernia    self dx'd (06/19/2016)   Hyperlipidemia    Hypertension    Lyme disease    dx'd by me 2003; cx's showed dx 08/2015   Migraine    3-4/year (06/19/2016)   Mitral regurgitation    a. 07/2023 Echo: Mod MR.   NICM (nonischemic cardiomyopathy - in  setting of RV pacing) (HCC)    a. 02/2019 Echo: EF 45-50%; b. 10/2022 Echo: EF 30-35%; c. 01/2023 CRT-P upgrade; e. 07/2023 Echo: EF 40-45%, glob HK; f. 10/2023 Echo: EF of 55 to 60% with grade 1 DD, nl RV fxn, triv MR, mild to mod AI, and mildly dilated ascending aorta at 42 mm.   PAF (paroxysmal atrial fibrillation) (HCC)    a. CHA2DS2VASc = 7--> eliquis ; b. On tikosyn .   Paroxysmal atrial flutter (HCC)    Presence of permanent cardiac pacemaker    PVC's (premature ventricular contractions)    Renal cancer, left (HCC) 2006   S/P cryotherapy   Spinal stenosis    cervical, 1 thoracic, lumbar (06/19/2016)   Squamous carcinoma    face, nose left shoulder, left arm (06/19/2016)   Stroke (HCC)    TIA (transient ischemic attack) 06/14/2016   I'm not sure that's what it was (06/25/2016)   Visit for monitoring Tikosyn  therapy 09/09/2017   Past Surgical History:  Procedure Laterality Date   ANKLE FRACTURE SURGERY Right 1967   BACK SURGERY  05/07/2020   BASAL CELL CARCINOMA EXCISION     face, nose left shoulder, left arm  BIOPSY PROSTATE  2001 & 2003   BIV UPGRADE N/A 01/10/2023   Procedure: BIV UPGRADE;  Surgeon: Fernande Elspeth BROCKS, MD;  Location: [facility] [place] INVASIVE CV LAB;  Service: Cardiovascular;  Laterality: N/A;   CARDIAC CATHETERIZATION  1990's   CARDIOVERSION N/A 09/11/2017   Procedure: CARDIOVERSION;  Surgeon: Pietro Redell RAMAN, MD;  Location: [facility] ENDOSCOPY;  Service: Cardiovascular;  Laterality: N/A;   CORONARY STENT INTERVENTION N/A 11/10/2023   Procedure: CORONARY STENT INTERVENTION;  Surgeon: Darron Deatrice LABOR, MD;  Location: [facility] INVASIVE CV LAB;  Service: Cardiovascular;  Laterality: N/A;   FRACTURE SURGERY     HOLEP-LASER ENUCLEATION OF THE PROSTATE WITH MORCELLATION N/A 07/10/2020   Procedure: HOLEP-LASER ENUCLEATION OF THE PROSTATE WITH MORCELLATION;  Surgeon: Penne Knee, MD;  Location: [facility] ORS;  Service: Urology;  Laterality: N/A;   INGUINAL HERNIA REPAIR Left 2012   INSERT /  REPLACE / REMOVE PACEMAKER  06/19/2016   LAPAROSCOPIC ABLATION RENAL MASS     LEFT HEART CATH AND CORONARY ANGIOGRAPHY Left 10/23/2017   Procedure: LEFT HEART CATH AND CORONARY ANGIOGRAPHY;  Surgeon: Darron Deatrice LABOR, MD;  Location: [facility] INVASIVE CV LAB;  Service: Cardiovascular;  Laterality: Left;   LEFT HEART CATH AND CORONARY ANGIOGRAPHY Left 02/26/2021   Procedure: LEFT HEART CATH AND CORONARY ANGIOGRAPHY;  Surgeon: Darron Deatrice LABOR, MD;  Location: [facility] INVASIVE CV LAB;  Service: Cardiovascular;  Laterality: Left;   LEFT HEART CATH AND CORONARY ANGIOGRAPHY Left 11/10/2023   Procedure: LEFT HEART CATH AND CORONARY ANGIOGRAPHY;  Surgeon: Darron Deatrice LABOR, MD;  Location: [facility] INVASIVE CV LAB;  Service: Cardiovascular;  Laterality: Left;   PACEMAKER IMPLANT N/A 06/19/2016   Procedure: Pacemaker Implant;  Surgeon: Elspeth BROCKS Fernande, MD;  Location: [facility] INVASIVE CV LAB;  Service: Cardiovascular;  Laterality: N/A;   pacemasker     PROSTATE SURGERY     RIGHT/LEFT HEART CATH AND CORONARY ANGIOGRAPHY Bilateral 11/25/2022   Procedure: RIGHT/LEFT HEART CATH AND CORONARY ANGIOGRAPHY;  Surgeon: Darron Deatrice LABOR, MD;  Location: [facility] INVASIVE CV LAB;  Service: Cardiovascular;  Laterality: Bilateral;   SQUAMOUS CELL CARCINOMA EXCISION     face, nose left shoulder, left arm   TONSILLECTOMY AND ADENOIDECTOMY     Patient Active Problem List   Diagnosis Date Noted   Angina pectoris 11/10/2023   Weakness of left foot 10/29/2023   CKD stage 3b, GFR 30-44 ml/min (HCC) 10/29/2023   Obstructive sleep apnea 10/29/2023   Combined systolic and diastolic congestive heart failure (HCC) 10/29/2023   AKI (acute kidney injury) 04/29/2023   UTI (urinary tract infection) 04/29/2023   Chronic systolic heart failure (HCC) 11/25/2022   Sick sinus syndrome (HCC) 02/26/2022   Closed fracture of second lumbar vertebra (HCC) 09/26/2021   Dysphagia, unspecified 09/26/2021   Heart block 09/26/2021   Heartburn 09/26/2021    History of lumbar fusion 09/26/2021   Inflammation of sacroiliac joint 09/26/2021   Other symptoms and signs involving cognitive functions and awareness 09/26/2021   Prediabetes 09/26/2021   Low back pain 09/26/2021   Renal cell carcinoma (HCC) 09/26/2021   Essential hypertension 09/26/2021   Cerebrovascular disease 09/26/2021   Transient cerebral ischemic attack, unspecified 09/26/2021   Cerebrovascular disease, unspecified 09/26/2021   Transient ischemic attack 09/26/2021   Unstable angina (HCC)    Lumbar burst fracture (HCC) 05/04/2020   Hypogonadism male 10/10/2019   Persistent atrial fibrillation (HCC) 01/12/2019   Acute low back pain 11/04/2017   Abnormal screening cardiac CT    Visit for monitoring Tikosyn  therapy 09/09/2017  History of TIA (transient ischemic attack) 08/29/2016   Paroxysmal atrial fibrillation (HCC) 07/23/2016   CAD (coronary artery disease) 07/23/2016   Snores 06/27/2016   S/P placement of cardiac pacemaker 06/25/2016   Hemiparesis (HCC) 06/25/2016   Acute ischemic stroke (HCC)    Left-sided weakness    Hemisensory loss    Dysarthria    Stroke-like symptoms    Complete heart block (HCC)    Pain in thoracic spine    Orthostasis    Lethargy    Vertebral artery occlusion, left    TIA (transient ischemic attack) 06/14/2016   Arthritis 02/06/2015   Esophageal reflux 02/06/2015   Arthritis urica 02/06/2015   Cannot sleep 02/06/2015   Arthritis, degenerative 02/06/2015   Adenocarcinoma, renal cell (HCC) 02/06/2015   Benign prostatic hyperplasia with lower urinary tract symptoms 11/21/2014   Personal history of other malignant neoplasm of kidney 11/21/2014   History of Lyme disease 05/06/2014   Palpitations 12/31/2013   Hyperlipidemia 11/02/2010   HYPERTENSION, BENIGN 04/16/2010   ONSET DATE: 11/10/23  REFERRING DIAG: CVA  THERAPY DIAG:  Muscle weakness (generalized)  Other lack of coordination  Rationale for Evaluation and Treatment:  Rehabilitation  SUBJECTIVE:  SUBJECTIVE STATEMENT: Pt reports he got out his fiddle recently and tried to play, but his fingers wouldn't do what they needed to do.  Pt accompanied by: self and significant other  PERTINENT HISTORY: 89 year old male who was admitted to hospital on 11/10/23 for management of unstable angina after pt developed clumsiness of the left arm after cardiac catheterization 7/7, workup for TIA. Per neurology note symptoms secondary to relative cerebral hypoperfusion in the setting of heart disease and right carotid artery and M1 stenosis. PMhx: PAF on Eliquis , HTN, HLD, prediabetes, OSA on CPAP, dominant left vertebral artery occlusion, TIA, SSS s/p PPM, BPH, renal CA, lumbar fusion, A-fib.  PRECAUTIONS: None  WEIGHT BEARING RESTRICTIONS: No  PAIN: 04/07/24: back pain 4-5/10 Are you having pain? 4/10  back pain  FALLS: Has patient fallen in last 6 months? Yes. Number of falls 4  LIVING ENVIRONMENT: Lives with: lives with their spouse Lives in: House/apartment Stairs: Yes: Internal: 2 steps; none Has following equipment at home: Single point cane and Walker - 2 wheeled  PLOF: Independent with basic ADLs  PATIENT GOALS: to go to [facility] for more detailed neuro testing and return to function in LUE  OBJECTIVE:  Note: Objective measures were completed at Evaluation unless otherwise noted.  HAND DOMINANCE: Right  ADLs: Overall ADLs: MIN A Transfers/ambulation related to ADLs: Eating: IND - uses dominant R Grooming: SETUP UB Dressing: SETUP - buttons assist LB Dressing: MIN - hiking pants  Toileting: IND Bathing: IND Tub Shower transfers: IND Equipment: none  IADLs: Shopping: spouse completes as needed Light housekeeping: spouse completes as needed Meal Prep: assist from spouse Community mobility: SPC/RW as needed Medication management: IND Landscape architect: IND Handwriting: 100% legible R hand  MOBILITY STATUS: Independent and Hx of  falls  POSTURE COMMENTS:  rounded shoulders and forward head Sitting balance: Sits without UE support up to 30 sec  ACTIVITY TOLERANCE: Activity tolerance: IND - tolerates 20 min standing activities   FUNCTIONAL OUTCOME MEASURES:    UPPER EXTREMITY ROM:   BUE AROM WFL   UPPER EXTREMITY MMT:     MMT Right eval Left eval  Shoulder flexion    Shoulder abduction    Shoulder adduction    Shoulder extension    Shoulder internal rotation    Shoulder external rotation  Middle trapezius    Lower trapezius    Elbow flexion    Elbow extension    Wrist flexion    Wrist extension    Wrist ulnar deviation    Wrist radial deviation    Wrist pronation    Wrist supination    (Blank rows = not tested)  HAND FUNCTION: Grip strength: Right: 76 lbs; Left: 55 lbs, Lateral pinch: Right: 15 lbs, Left: 11 lbs, and 3 point pinch: Right: 21 lbs, Left: 14 lbs 03/08/24: Grip strength: Right: 85 lbs, Left: 63 lbs; Lateral pinch: Right: 24 lbs, Left: 13 lbs, and 3 point pinch: Right: 25 lbs, Left: 16 lbs   COORDINATION: 9 Hole Peg test: Right: 26 sec; Left: 10 sec 03/08/24: Left: 58 sec.   SENSATION:  12/17/23 Light touch: decreased sensation and numbness in L hand 5th digit Stereognosis:   Hot/Cold:   Proprioception: intact  EDEMA: mild swelling to L hand  COGNITION: Overall cognitive status: Within functional limits for tasks assessed  VISION: Subjective report: baseline Baseline vision: No visual deficits  VISION ASSESSMENT: WFL                                                                                                 TREATMENT DATE: 04/07/24  Moist heat applied to T-spine for pain management throughout session  Therapeutic Activity: -Facilitated L hand FMC/dexterity skills targeting motor planning needed for playing fiddle.  Pt held a 1.5lb dowel and simulated L handed finger patterns to mimic playing single notes and chords on a fiddle.  OT provided progressive  activities, beginning with single finger taps on top of dowel simultaneous to a slow beat, progressing to alternating digits and chords, alternating between slow and medium beats.  OT provided auditory cueing with hand tapping on table top to mimic metronome, progressing to songs over speaker with pt working to simulate hand patterns noted above while keeping in time with beats.   -Recommended pt practice above activities in the home to retrain L hand, working towards more complex songs with faster beats.   PATIENT EDUCATION: Education details: activities for retraining L hand for playing fiddle Person educated: Patient Education method: Explanation and Verbal cues Education comprehension: verbalized understanding  HOME EXERCISE PROGRAM: Blue [facility]raputty [facility] - coins at tabletop Intrinsic hand strengthening  GOALS: Goals reviewed with patient? Yes  SHORT TERM GOALS: Target date: 04/19/24  Pt will IND complete HEP for functional strenghening of non-dominant LUE. Baseline: not initiated; 03/08/24: Min vc with recently added exercises (intrinsic hand strengthening) Goal status: in progress   LONG TERM GOALS: Target date: 05/31/24  Pt will increase L lateral pinch strength by 5 # of force to securely hold a wrench during leisure activities. Baseline: L pinch 11#; 03/08/24: L 13# Goal status: in progress  2.  Pt will increase L grip strength by 15# to wash dishes without dropping items.  Baseline: L grip 55#; 03/08/24: L grip 63# (pt reports having to keep eyes on dishes to avoid dropping any d/t sensory deficits) Goal status: in progress   3.  Pt will improve LUE coordination  by 30 seconds to independently manipulate fasteners during dressing tasks. Baseline: L 1'10; 03/08/24: L 58 sec  Goal status: in progress  4.  Pt will improve dynamic seated balance to don B shoes independently on edge of bed without loss of balance. Baseline: requires chair with armrest to don shoes with standby  assist; 03/08/24: Consistently sits in chair with arm rests (without SBA) for increased stability Goal status: d/c  5.  Pt will increase L hand lumbrical strength in order to securely hold and stabilize flat objects (notebook) in L hand.  Baseline: 03/08/24: L hand 4th and 5th digit PIPs flex and require manual joint blocking or oval 8 splint to prevent flexion.     Goal status: New  ASSESSMENT: CLINICAL IMPRESSION:  Pt verbalized picking up his fiddle and trying to play over the holiday, but L hand motor planning was insufficient and pt couldn't play his intended song.  Focus on activities today to simulate finger patterns needed to play fiddle.  By end of session, pt was able to simulate keeping in time with slow to moderate beats of a song while alternating single finger taps with fairly good consistency.  Pt is still developing consistency with alternating digits when simulating alternating between 2 chords, but was fairly consistent and accurate with a slow beat, and was able to manage this with vision occluded.  Pt was pleased with improvement in his L hand motor planning throughout session, and verbalized optimism with beginning with slow, simple notes and songs before progressing to more complex songs which he was easily able to play prior to CVA.  Pt plans to practice at home with above recommended activities.  Pt continues to benefit from OT services to work on L hand strength and L [facility] skills to improve functional independence of ADL/IADL tasks at home.   PERFORMANCE DEFICITS: in functional skills including ADLs, IADLs, coordination, dexterity, sensation, and edema and psychosocial skills including environmental adaptation.   IMPAIRMENTS: are limiting patient from ADLs, IADLs, and leisure.   CO-MORBIDITIES: may have co-morbidities  that affects occupational performance. Patient will benefit from skilled OT to address above impairments and improve overall function.  MODIFICATION OR  ASSISTANCE TO COMPLETE EVALUATION: No modification of tasks or assist necessary to complete an evaluation.  OT OCCUPATIONAL PROFILE AND HISTORY: Problem focused assessment: Including review of records relating to presenting problem.  CLINICAL DECISION MAKING: LOW - limited treatment options, no task modification necessary  REHAB POTENTIAL: Good  EVALUATION COMPLEXITY: Moderate    PLAN:  OT FREQUENCY: 1-2x/week  OT DURATION: 12 weeks  PLANNED INTERVENTIONS: 97168 OT Re-evaluation, 97535 self care/ADL training, 02889 therapeutic exercise, 97530 therapeutic activity, and 97112 neuromuscular re-education  RECOMMENDED OTHER SERVICES: PT  CONSULTED AND AGREED WITH PLAN OF CARE: Patient and family member/caregiver  PLAN FOR NEXT SESSION: see above  Inocente Blazing, MS, OTR/L  04/11/2024, 3:40 PM

## 2024-04-12 ENCOUNTER — Ambulatory Visit

## 2024-04-12 DIAGNOSIS — M6281 Muscle weakness (generalized): Secondary | ICD-10-CM | POA: Diagnosis not present

## 2024-04-12 DIAGNOSIS — R278 Other lack of coordination: Secondary | ICD-10-CM

## 2024-04-12 NOTE — Therapy (Unsigned)
 OUTPATIENT OCCUPATIONAL THERAPY NEURO TREATMENT NOTE  Patient Name: Thomas [name] MRN: [id number] DOB:05-09-1932, 89 y.o., male Today's Date: 04/13/2024  PCP: Charlie Forte REFERRING PROVIDER: [name]  END OF SESSION:  OT End of Session - 04/13/24 1019     Visit Number 17    Number of Visits 34    Date for Recertification  05/31/24    Authorization Time Period Reporting period beginning 03/08/24    OT Start Time 1150    OT Stop Time 1235    OT Time Calculation (min) 45 min    Activity Tolerance Patient tolerated treatment well    Behavior During Therapy [facility] for tasks assessed/performed         Past Medical History:  Diagnosis Date   Aortic insufficiency    a. 07/2023 Echo: Mod AI; b. 09/2023 Echo: Mild-mod AI.   Aortic valve disorders    Arthritis    Basal cell carcinoma    face, nose left shoulder, left arm (06/19/2016)   Basal cell carcinoma 09/13/2020   right temple   BBB (bundle branch block)    hx right   CAD (coronary artery disease)    a. 10/2017 Cath: mod, nonobs dzs; b. 02/2021 Cath: mod, nonobs dzs; c. 11/2022 Cath: LAD 30ost/p, 60m, D1 min irregs, LCX 65p/m, OM1 min irregs, OM2 40, OM3 nl, RCA 30p, 7m, 40d w/ 40 in side branch-->Med rx; d. 11/2023 PCI: LAD 90ost/p (3.5x15 Onyx Frontier DES). Otw stable anatomy.   Chronic back pain    neck, thoracic, lower back (06/19/2016)   CKD (chronic kidney disease), stage III (HCC)    Complete heart block (HCC) 06/2016   a. 06/2016 s/p MDT PPM; b. 01/2023 s/p upgrade to MDT CRT-P (ser # [id number]).   GERD (gastroesophageal reflux disease)    Gout    Heart block    I've had type I, II Wenke before now (06/19/2016)   History of gout    History of hiatal hernia    self dx'd (06/19/2016)   Hyperlipidemia    Hypertension    Lyme disease    dx'd by me 2003; cx's showed dx 08/2015   Migraine    3-4/year (06/19/2016)   Mitral regurgitation    a. 07/2023 Echo: Mod MR.   NICM (nonischemic cardiomyopathy - in  setting of RV pacing) (HCC)    a. 02/2019 Echo: EF 45-50%; b. 10/2022 Echo: EF 30-35%; c. 01/2023 CRT-P upgrade; e. 07/2023 Echo: EF 40-45%, glob HK; f. 10/2023 Echo: EF of 55 to 60% with grade 1 DD, nl RV fxn, triv MR, mild to mod AI, and mildly dilated ascending aorta at 42 mm.   PAF (paroxysmal atrial fibrillation) (HCC)    a. CHA2DS2VASc = 7--> eliquis ; b. On tikosyn .   Paroxysmal atrial flutter (HCC)    Presence of permanent cardiac pacemaker    PVC's (premature ventricular contractions)    Renal cancer, left (HCC) 2006   S/P cryotherapy   Spinal stenosis    cervical, 1 thoracic, lumbar (06/19/2016)   Squamous carcinoma    face, nose left shoulder, left arm (06/19/2016)   Stroke (HCC)    TIA (transient ischemic attack) 06/14/2016   I'm not sure that's what it was (06/25/2016)   Visit for monitoring Tikosyn  therapy 09/09/2017   Past Surgical History:  Procedure Laterality Date   ANKLE FRACTURE SURGERY Right 1967   BACK SURGERY  05/07/2020   BASAL CELL CARCINOMA EXCISION     face, nose left shoulder, left arm  BIOPSY PROSTATE  2001 & 2003   BIV UPGRADE N/A 01/10/2023   Procedure: BIV UPGRADE;  Surgeon: Fernande Elspeth BROCKS, MD;  Location: [facility] INVASIVE CV LAB;  Service: Cardiovascular;  Laterality: N/A;   CARDIAC CATHETERIZATION  1990's   CARDIOVERSION N/A 09/11/2017   Procedure: CARDIOVERSION;  Surgeon: Pietro Redell RAMAN, MD;  Location: [facility] ENDOSCOPY;  Service: Cardiovascular;  Laterality: N/A;   CORONARY STENT INTERVENTION N/A 11/10/2023   Procedure: CORONARY STENT INTERVENTION;  Surgeon: Darron Deatrice LABOR, MD;  Location: [facility] INVASIVE CV LAB;  Service: Cardiovascular;  Laterality: N/A;   FRACTURE SURGERY     HOLEP-LASER ENUCLEATION OF THE PROSTATE WITH MORCELLATION N/A 07/10/2020   Procedure: HOLEP-LASER ENUCLEATION OF THE PROSTATE WITH MORCELLATION;  Surgeon: Penne Knee, MD;  Location: [facility] ORS;  Service: Urology;  Laterality: N/A;   INGUINAL HERNIA REPAIR Left 2012   INSERT /  REPLACE / REMOVE PACEMAKER  06/19/2016   LAPAROSCOPIC ABLATION RENAL MASS     LEFT HEART CATH AND CORONARY ANGIOGRAPHY Left 10/23/2017   Procedure: LEFT HEART CATH AND CORONARY ANGIOGRAPHY;  Surgeon: Darron Deatrice LABOR, MD;  Location: [facility] INVASIVE CV LAB;  Service: Cardiovascular;  Laterality: Left;   LEFT HEART CATH AND CORONARY ANGIOGRAPHY Left 02/26/2021   Procedure: LEFT HEART CATH AND CORONARY ANGIOGRAPHY;  Surgeon: Darron Deatrice LABOR, MD;  Location: [facility] INVASIVE CV LAB;  Service: Cardiovascular;  Laterality: Left;   LEFT HEART CATH AND CORONARY ANGIOGRAPHY Left 11/10/2023   Procedure: LEFT HEART CATH AND CORONARY ANGIOGRAPHY;  Surgeon: Darron Deatrice LABOR, MD;  Location: [facility] INVASIVE CV LAB;  Service: Cardiovascular;  Laterality: Left;   PACEMAKER IMPLANT N/A 06/19/2016   Procedure: Pacemaker Implant;  Surgeon: Elspeth BROCKS Fernande, MD;  Location: [facility] INVASIVE CV LAB;  Service: Cardiovascular;  Laterality: N/A;   pacemasker     PROSTATE SURGERY     RIGHT/LEFT HEART CATH AND CORONARY ANGIOGRAPHY Bilateral 11/25/2022   Procedure: RIGHT/LEFT HEART CATH AND CORONARY ANGIOGRAPHY;  Surgeon: Darron Deatrice LABOR, MD;  Location: [facility] INVASIVE CV LAB;  Service: Cardiovascular;  Laterality: Bilateral;   SQUAMOUS CELL CARCINOMA EXCISION     face, nose left shoulder, left arm   TONSILLECTOMY AND ADENOIDECTOMY     Patient Active Problem List   Diagnosis Date Noted   Angina pectoris 11/10/2023   Weakness of left foot 10/29/2023   CKD stage 3b, GFR 30-44 ml/min (HCC) 10/29/2023   Obstructive sleep apnea 10/29/2023   Combined systolic and diastolic congestive heart failure (HCC) 10/29/2023   AKI (acute kidney injury) 04/29/2023   UTI (urinary tract infection) 04/29/2023   Chronic systolic heart failure (HCC) 11/25/2022   Sick sinus syndrome (HCC) 02/26/2022   Closed fracture of second lumbar vertebra (HCC) 09/26/2021   Dysphagia, unspecified 09/26/2021   Heart block 09/26/2021   Heartburn 09/26/2021    History of lumbar fusion 09/26/2021   Inflammation of sacroiliac joint 09/26/2021   Other symptoms and signs involving cognitive functions and awareness 09/26/2021   Prediabetes 09/26/2021   Low back pain 09/26/2021   Renal cell carcinoma (HCC) 09/26/2021   Essential hypertension 09/26/2021   Cerebrovascular disease 09/26/2021   Transient cerebral ischemic attack, unspecified 09/26/2021   Cerebrovascular disease, unspecified 09/26/2021   Transient ischemic attack 09/26/2021   Unstable angina (HCC)    Lumbar burst fracture (HCC) 05/04/2020   Hypogonadism male 10/10/2019   Persistent atrial fibrillation (HCC) 01/12/2019   Acute low back pain 11/04/2017   Abnormal screening cardiac CT    Visit for monitoring Tikosyn  therapy 09/09/2017  History of TIA (transient ischemic attack) 08/29/2016   Paroxysmal atrial fibrillation (HCC) 07/23/2016   CAD (coronary artery disease) 07/23/2016   Snores 06/27/2016   S/P placement of cardiac pacemaker 06/25/2016   Hemiparesis (HCC) 06/25/2016   Acute ischemic stroke (HCC)    Left-sided weakness    Hemisensory loss    Dysarthria    Stroke-like symptoms    Complete heart block (HCC)    Pain in thoracic spine    Orthostasis    Lethargy    Vertebral artery occlusion, left    TIA (transient ischemic attack) 06/14/2016   Arthritis 02/06/2015   Esophageal reflux 02/06/2015   Arthritis urica 02/06/2015   Cannot sleep 02/06/2015   Arthritis, degenerative 02/06/2015   Adenocarcinoma, renal cell (HCC) 02/06/2015   Benign prostatic hyperplasia with lower urinary tract symptoms 11/21/2014   Personal history of other malignant neoplasm of kidney 11/21/2014   History of Lyme disease 05/06/2014   Palpitations 12/31/2013   Hyperlipidemia 11/02/2010   HYPERTENSION, BENIGN 04/16/2010   ONSET DATE: 11/10/23  REFERRING DIAG: CVA  THERAPY DIAG:  Muscle weakness (generalized)  Other lack of coordination  Rationale for Evaluation and Treatment:  Rehabilitation  SUBJECTIVE:  SUBJECTIVE STATEMENT: Pt reports eagerness to get back to doing some house projects once his back heals.   Pt accompanied by: self and significant other  PERTINENT HISTORY: 89 year old male who was admitted to hospital on 11/10/23 for management of unstable angina after pt developed clumsiness of the left arm after cardiac catheterization 7/7, workup for TIA. Per neurology note symptoms secondary to relative cerebral hypoperfusion in the setting of heart disease and right carotid artery and M1 stenosis. PMhx: PAF on Eliquis , HTN, HLD, prediabetes, OSA on CPAP, dominant left vertebral artery occlusion, TIA, SSS s/p PPM, BPH, renal CA, lumbar fusion, A-fib.  PRECAUTIONS: None  WEIGHT BEARING RESTRICTIONS: No  PAIN: 04/12/24: back pain 3-4/10  FALLS: Has patient fallen in last 6 months? Yes. Number of falls 4  LIVING ENVIRONMENT: Lives with: lives with their spouse Lives in: House/apartment Stairs: Yes: Internal: 2 steps; none Has following equipment at home: Single point cane and Walker - 2 wheeled  PLOF: Independent with basic ADLs  PATIENT GOALS: to go to [facility] for more detailed neuro testing and return to function in LUE  OBJECTIVE:  Note: Objective measures were completed at Evaluation unless otherwise noted.  HAND DOMINANCE: Right  ADLs: Overall ADLs: MIN A Transfers/ambulation related to ADLs: Eating: IND - uses dominant R Grooming: SETUP UB Dressing: SETUP - buttons assist LB Dressing: MIN - hiking pants  Toileting: IND Bathing: IND Tub Shower transfers: IND Equipment: none  IADLs: Shopping: spouse completes as needed Light housekeeping: spouse completes as needed Meal Prep: assist from spouse Community mobility: SPC/RW as needed Medication management: IND Landscape architect: IND Handwriting: 100% legible R hand  MOBILITY STATUS: Independent and Hx of falls  POSTURE COMMENTS:  rounded shoulders and forward head Sitting  balance: Sits without UE support up to 30 sec  ACTIVITY TOLERANCE: Activity tolerance: IND - tolerates 20 min standing activities   FUNCTIONAL OUTCOME MEASURES:    UPPER EXTREMITY ROM:   BUE AROM WFL   UPPER EXTREMITY MMT:     MMT Right eval Left eval  Shoulder flexion    Shoulder abduction    Shoulder adduction    Shoulder extension    Shoulder internal rotation    Shoulder external rotation    Middle trapezius    Lower trapezius    Elbow flexion  Elbow extension    Wrist flexion    Wrist extension    Wrist ulnar deviation    Wrist radial deviation    Wrist pronation    Wrist supination    (Blank rows = not tested)  HAND FUNCTION: Grip strength: Right: 76 lbs; Left: 55 lbs, Lateral pinch: Right: 15 lbs, Left: 11 lbs, and 3 point pinch: Right: 21 lbs, Left: 14 lbs 03/08/24: Grip strength: Right: 85 lbs, Left: 63 lbs; Lateral pinch: Right: 24 lbs, Left: 13 lbs, and 3 point pinch: Right: 25 lbs, Left: 16 lbs   COORDINATION: 9 Hole Peg test: Right: 26 sec; Left: 10 sec 03/08/24: Left: 58 sec.   SENSATION:  12/17/23 Light touch: decreased sensation and numbness in L hand 5th digit Stereognosis:   Hot/Cold:   Proprioception: intact  EDEMA: mild swelling to L hand  COGNITION: Overall cognitive status: Within functional limits for tasks assessed  VISION: Subjective report: baseline Baseline vision: No visual deficits  VISION ASSESSMENT: WFL                                                                                                 TREATMENT DATE: 04/12/24  Moist heat applied to T-spine for pain management throughout session  Therapeutic Activity: -Facilitated L hand FMC/dexterity skills and GMC skills working to place push pins into [facility] [employer] on board; board placed to challenge reach with extended elbow into forward and lateral reaching planes without support of arm on table top.  Vc for downgrading activity to push pins into larger  target and within center of cork board d/t edges more stiff with pt unable to successfully place pins without dropping.  -Facilitated bilat hand coordination and sustained 2 and 3 point pinch to stabilize 2 narrow pegs with L hand (mimicking a nail) while hammering pegs into styrofoam block using R hand with hammer.  Foam block positioned on table top, progressing to chest level and above shoulder level on wall to simulate hanging pictures.   -Facilitated bilat hand coordination and sustained 2 and 3 point pinch to secure sticky notes onto cork board at table top level and chest level on wall.  L hand used to stabilize and push pin while R hand stabilized sticky note. -Pt practiced sustained pinch to remove push pins and 2 pegs from foam using L hand; vc for attempting 2 and 3 point pinch patterns rather than using a lateral pinch.    PATIENT EDUCATION: Education details: HEP Person educated: Patient Education method: Leisure Centre Manager cues Education comprehension: verbalized understanding  HOME EXERCISE PROGRAM: [employer] [facility] [facility] - coins at tabletop Intrinsic hand strengthening  GOALS: Goals reviewed with patient? Yes  SHORT TERM GOALS: Target date: 04/19/24  Pt will IND complete HEP for functional strenghening of non-dominant LUE. Baseline: not initiated; 03/08/24: Min vc with recently added exercises (intrinsic hand strengthening) Goal status: in progress   LONG TERM GOALS: Target date: 05/31/24  Pt will increase L lateral pinch strength by 5 # of force to securely hold a wrench during leisure activities. Baseline: L pinch 11#; 03/08/24:  L 13# Goal status: in progress  2.  Pt will increase L grip strength by 15# to wash dishes without dropping items.  Baseline: L grip 55#; 03/08/24: L grip 63# (pt reports having to keep eyes on dishes to avoid dropping any d/t sensory deficits) Goal status: in progress   3.  Pt will improve LUE coordination by 30 seconds to independently  manipulate fasteners during dressing tasks. Baseline: L 1'10; 03/08/24: L 58 sec  Goal status: in progress  4.  Pt will improve dynamic seated balance to don B shoes independently on edge of bed without loss of balance. Baseline: requires chair with armrest to don shoes with standby assist; 03/08/24: Consistently sits in chair with arm rests (without SBA) for increased stability Goal status: d/c  5.  Pt will increase L hand lumbrical strength in order to securely hold and stabilize flat objects (notebook) in L hand.  Baseline: 03/08/24: L hand 4th and 5th digit PIPs flex and require manual joint blocking or oval 8 splint to prevent flexion.     Goal status: New  ASSESSMENT: CLINICAL IMPRESSION:  Pt demonstrating improved control, very mild LUE ataxia when reaching towards a target with elbow extended into forward and lateral planes of movement.  Initially demonstrated frequent dropping with push pins, but improved with high reps and avoiding edges of cork board when pushing pins into board.  Pt did well to stabilize a 2 narrow peg (simulating nail) while hammering with R hand, both at table top, chest level on wall, and above shoulder level on wall to simulate hanging pictures, though pt did have a few dropped pegs from L hand.  Pt required repeat trials for sustaining pinch to pull pegs from styrofoam block d/t fingers tending to slip from position.  Pt continues to benefit from OT services to work on L hand strength and L [facility] skills to improve functional independence of ADL/IADL tasks at home.   PERFORMANCE DEFICITS: in functional skills including ADLs, IADLs, coordination, dexterity, sensation, and edema and psychosocial skills including environmental adaptation.   IMPAIRMENTS: are limiting patient from ADLs, IADLs, and leisure.   CO-MORBIDITIES: may have co-morbidities  that affects occupational performance. Patient will benefit from skilled OT to address above impairments and improve overall  function.  MODIFICATION OR ASSISTANCE TO COMPLETE EVALUATION: No modification of tasks or assist necessary to complete an evaluation.  OT OCCUPATIONAL PROFILE AND HISTORY: Problem focused assessment: Including review of records relating to presenting problem.  CLINICAL DECISION MAKING: LOW - limited treatment options, no task modification necessary  REHAB POTENTIAL: Good  EVALUATION COMPLEXITY: Moderate    PLAN:  OT FREQUENCY: 1-2x/week  OT DURATION: 12 weeks  PLANNED INTERVENTIONS: 97168 OT Re-evaluation, 97535 self care/ADL training, 02889 therapeutic exercise, 97530 therapeutic activity, and 97112 neuromuscular re-education  RECOMMENDED OTHER SERVICES: PT  CONSULTED AND AGREED WITH PLAN OF CARE: Patient and family member/caregiver  PLAN FOR NEXT SESSION: see above  Inocente Blazing, MS, OTR/L  04/13/2024, 10:35 AM

## 2024-04-14 ENCOUNTER — Ambulatory Visit

## 2024-04-14 DIAGNOSIS — R278 Other lack of coordination: Secondary | ICD-10-CM

## 2024-04-14 DIAGNOSIS — M6281 Muscle weakness (generalized): Secondary | ICD-10-CM

## 2024-04-14 NOTE — Therapy (Unsigned)
 OUTPATIENT OCCUPATIONAL THERAPY NEURO TREATMENT NOTE  Patient Name: Thomas [name] MRN: [id number] DOB:01-10-1933, 89 y.o., male Today's Date: 04/18/2024  PCP: Charlie Forte REFERRING PROVIDER: [name]  END OF SESSION:  OT End of Session - 04/18/24 0714     Visit Number 18    Number of Visits 34    Date for Recertification  05/31/24    Authorization Time Period Reporting period beginning 03/08/24    OT Start Time 1145    OT Stop Time 1230    OT Time Calculation (min) 45 min    Activity Tolerance Patient tolerated treatment well    Behavior During Therapy [facility] for tasks assessed/performed         Past Medical History:  Diagnosis Date   Aortic insufficiency    a. 07/2023 Echo: Mod AI; b. 09/2023 Echo: Mild-mod AI.   Aortic valve disorders    Arthritis    Basal cell carcinoma    face, nose left shoulder, left arm (06/19/2016)   Basal cell carcinoma 09/13/2020   right temple   BBB (bundle branch block)    hx right   CAD (coronary artery disease)    a. 10/2017 Cath: mod, nonobs dzs; b. 02/2021 Cath: mod, nonobs dzs; c. 11/2022 Cath: LAD 30ost/p, 77m, D1 min irregs, LCX 65p/m, OM1 min irregs, OM2 40, OM3 nl, RCA 30p, 62m, 40d w/ 40 in side branch-->Med rx; d. 11/2023 PCI: LAD 90ost/p (3.5x15 Onyx Frontier DES). Otw stable anatomy.   Chronic back pain    neck, thoracic, lower back (06/19/2016)   CKD (chronic kidney disease), stage III (HCC)    Complete heart block (HCC) 06/2016   a. 06/2016 s/p MDT PPM; b. 01/2023 s/p upgrade to MDT CRT-P (ser # [id number]).   GERD (gastroesophageal reflux disease)    Gout    Heart block    I've had type I, II Wenke before now (06/19/2016)   History of gout    History of hiatal hernia    self dx'd (06/19/2016)   Hyperlipidemia    Hypertension    Lyme disease    dx'd by me 2003; cx's showed dx 08/2015   Migraine    3-4/year (06/19/2016)   Mitral regurgitation    a. 07/2023 Echo: Mod MR.   NICM (nonischemic cardiomyopathy - in  setting of RV pacing) (HCC)    a. 02/2019 Echo: EF 45-50%; b. 10/2022 Echo: EF 30-35%; c. 01/2023 CRT-P upgrade; e. 07/2023 Echo: EF 40-45%, glob HK; f. 10/2023 Echo: EF of 55 to 60% with grade 1 DD, nl RV fxn, triv MR, mild to mod AI, and mildly dilated ascending aorta at 42 mm.   PAF (paroxysmal atrial fibrillation) (HCC)    a. CHA2DS2VASc = 7--> eliquis ; b. On tikosyn .   Paroxysmal atrial flutter (HCC)    Presence of permanent cardiac pacemaker    PVC's (premature ventricular contractions)    Renal cancer, left (HCC) 2006   S/P cryotherapy   Spinal stenosis    cervical, 1 thoracic, lumbar (06/19/2016)   Squamous carcinoma    face, nose left shoulder, left arm (06/19/2016)   Stroke (HCC)    TIA (transient ischemic attack) 06/14/2016   I'm not sure that's what it was (06/25/2016)   Visit for monitoring Tikosyn  therapy 09/09/2017   Past Surgical History:  Procedure Laterality Date   ANKLE FRACTURE SURGERY Right 1967   BACK SURGERY  05/07/2020   BASAL CELL CARCINOMA EXCISION     face, nose left shoulder, left arm  BIOPSY PROSTATE  2001 & 2003   BIV UPGRADE N/A 01/10/2023   Procedure: BIV UPGRADE;  Surgeon: Fernande Elspeth BROCKS, MD;  Location: [facility] INVASIVE CV LAB;  Service: Cardiovascular;  Laterality: N/A;   CARDIAC CATHETERIZATION  1990's   CARDIOVERSION N/A 09/11/2017   Procedure: CARDIOVERSION;  Surgeon: Pietro Redell RAMAN, MD;  Location: [facility] ENDOSCOPY;  Service: Cardiovascular;  Laterality: N/A;   CORONARY STENT INTERVENTION N/A 11/10/2023   Procedure: CORONARY STENT INTERVENTION;  Surgeon: Darron Deatrice LABOR, MD;  Location: [facility] INVASIVE CV LAB;  Service: Cardiovascular;  Laterality: N/A;   FRACTURE SURGERY     HOLEP-LASER ENUCLEATION OF THE PROSTATE WITH MORCELLATION N/A 07/10/2020   Procedure: HOLEP-LASER ENUCLEATION OF THE PROSTATE WITH MORCELLATION;  Surgeon: Penne Knee, MD;  Location: [facility] ORS;  Service: Urology;  Laterality: N/A;   INGUINAL HERNIA REPAIR Left 2012   INSERT /  REPLACE / REMOVE PACEMAKER  06/19/2016   LAPAROSCOPIC ABLATION RENAL MASS     LEFT HEART CATH AND CORONARY ANGIOGRAPHY Left 10/23/2017   Procedure: LEFT HEART CATH AND CORONARY ANGIOGRAPHY;  Surgeon: Darron Deatrice LABOR, MD;  Location: [facility] INVASIVE CV LAB;  Service: Cardiovascular;  Laterality: Left;   LEFT HEART CATH AND CORONARY ANGIOGRAPHY Left 02/26/2021   Procedure: LEFT HEART CATH AND CORONARY ANGIOGRAPHY;  Surgeon: Darron Deatrice LABOR, MD;  Location: [facility] INVASIVE CV LAB;  Service: Cardiovascular;  Laterality: Left;   LEFT HEART CATH AND CORONARY ANGIOGRAPHY Left 11/10/2023   Procedure: LEFT HEART CATH AND CORONARY ANGIOGRAPHY;  Surgeon: Darron Deatrice LABOR, MD;  Location: [facility] INVASIVE CV LAB;  Service: Cardiovascular;  Laterality: Left;   PACEMAKER IMPLANT N/A 06/19/2016   Procedure: Pacemaker Implant;  Surgeon: Elspeth BROCKS Fernande, MD;  Location: [facility] INVASIVE CV LAB;  Service: Cardiovascular;  Laterality: N/A;   pacemasker     PROSTATE SURGERY     RIGHT/LEFT HEART CATH AND CORONARY ANGIOGRAPHY Bilateral 11/25/2022   Procedure: RIGHT/LEFT HEART CATH AND CORONARY ANGIOGRAPHY;  Surgeon: Darron Deatrice LABOR, MD;  Location: [facility] INVASIVE CV LAB;  Service: Cardiovascular;  Laterality: Bilateral;   SQUAMOUS CELL CARCINOMA EXCISION     face, nose left shoulder, left arm   TONSILLECTOMY AND ADENOIDECTOMY     Patient Active Problem List   Diagnosis Date Noted   Angina pectoris 11/10/2023   Weakness of left foot 10/29/2023   CKD stage 3b, GFR 30-44 ml/min (HCC) 10/29/2023   Obstructive sleep apnea 10/29/2023   Combined systolic and diastolic congestive heart failure (HCC) 10/29/2023   AKI (acute kidney injury) 04/29/2023   UTI (urinary tract infection) 04/29/2023   Chronic systolic heart failure (HCC) 11/25/2022   Sick sinus syndrome (HCC) 02/26/2022   Closed fracture of second lumbar vertebra (HCC) 09/26/2021   Dysphagia, unspecified 09/26/2021   Heart block 09/26/2021   Heartburn 09/26/2021    History of lumbar fusion 09/26/2021   Inflammation of sacroiliac joint 09/26/2021   Other symptoms and signs involving cognitive functions and awareness 09/26/2021   Prediabetes 09/26/2021   Low back pain 09/26/2021   Renal cell carcinoma (HCC) 09/26/2021   Essential hypertension 09/26/2021   Cerebrovascular disease 09/26/2021   Transient cerebral ischemic attack, unspecified 09/26/2021   Cerebrovascular disease, unspecified 09/26/2021   Transient ischemic attack 09/26/2021   Unstable angina (HCC)    Lumbar burst fracture (HCC) 05/04/2020   Hypogonadism male 10/10/2019   Persistent atrial fibrillation (HCC) 01/12/2019   Acute low back pain 11/04/2017   Abnormal screening cardiac CT    Visit for monitoring Tikosyn  therapy 09/09/2017  History of TIA (transient ischemic attack) 08/29/2016   Paroxysmal atrial fibrillation (HCC) 07/23/2016   CAD (coronary artery disease) 07/23/2016   Snores 06/27/2016   S/P placement of cardiac pacemaker 06/25/2016   Hemiparesis (HCC) 06/25/2016   Acute ischemic stroke (HCC)    Left-sided weakness    Hemisensory loss    Dysarthria    Stroke-like symptoms    Complete heart block (HCC)    Pain in thoracic spine    Orthostasis    Lethargy    Vertebral artery occlusion, left    TIA (transient ischemic attack) 06/14/2016   Arthritis 02/06/2015   Esophageal reflux 02/06/2015   Arthritis urica 02/06/2015   Cannot sleep 02/06/2015   Arthritis, degenerative 02/06/2015   Adenocarcinoma, renal cell (HCC) 02/06/2015   Benign prostatic hyperplasia with lower urinary tract symptoms 11/21/2014   Personal history of other malignant neoplasm of kidney 11/21/2014   History of Lyme disease 05/06/2014   Palpitations 12/31/2013   Hyperlipidemia 11/02/2010   HYPERTENSION, BENIGN 04/16/2010   ONSET DATE: 11/10/23  REFERRING DIAG: CVA  THERAPY DIAG:  Muscle weakness (generalized)  Other lack of coordination  Rationale for Evaluation and Treatment:  Rehabilitation  SUBJECTIVE:  SUBJECTIVE STATEMENT: Pt reports doing well today.   Pt accompanied by: self and significant other  PERTINENT HISTORY: 89 year old male who was admitted to hospital on 11/10/23 for management of unstable angina after pt developed clumsiness of the left arm after cardiac catheterization 7/7, workup for TIA. Per neurology note symptoms secondary to relative cerebral hypoperfusion in the setting of heart disease and right carotid artery and M1 stenosis. PMhx: PAF on Eliquis , HTN, HLD, prediabetes, OSA on CPAP, dominant left vertebral artery occlusion, TIA, SSS s/p PPM, BPH, renal CA, lumbar fusion, A-fib.  PRECAUTIONS: None  WEIGHT BEARING RESTRICTIONS: No  PAIN: 04/14/24: back pain 2-3/10  FALLS: Has patient fallen in last 6 months? Yes. Number of falls 4  LIVING ENVIRONMENT: Lives with: lives with their spouse Lives in: House/apartment Stairs: Yes: Internal: 2 steps; none Has following equipment at home: Single point cane and Walker - 2 wheeled  PLOF: Independent with basic ADLs  PATIENT GOALS: to go to [facility] for more detailed neuro testing and return to function in LUE  OBJECTIVE:  Note: Objective measures were completed at Evaluation unless otherwise noted.  HAND DOMINANCE: Right  ADLs: Overall ADLs: MIN A Transfers/ambulation related to ADLs: Eating: IND - uses dominant R Grooming: SETUP UB Dressing: SETUP - buttons assist LB Dressing: MIN - hiking pants  Toileting: IND Bathing: IND Tub Shower transfers: IND Equipment: none  IADLs: Shopping: spouse completes as needed Light housekeeping: spouse completes as needed Meal Prep: assist from spouse Community mobility: SPC/RW as needed Medication management: IND Landscape architect: IND Handwriting: 100% legible R hand  MOBILITY STATUS: Independent and Hx of falls  POSTURE COMMENTS:  rounded shoulders and forward head Sitting balance: Sits without UE support up to 30 sec  ACTIVITY  TOLERANCE: Activity tolerance: IND - tolerates 20 min standing activities   FUNCTIONAL OUTCOME MEASURES:    UPPER EXTREMITY ROM:   BUE AROM WFL   UPPER EXTREMITY MMT:     MMT Right eval Left eval  Shoulder flexion    Shoulder abduction    Shoulder adduction    Shoulder extension    Shoulder internal rotation    Shoulder external rotation    Middle trapezius    Lower trapezius    Elbow flexion    Elbow extension    Wrist flexion  Wrist extension    Wrist ulnar deviation    Wrist radial deviation    Wrist pronation    Wrist supination    (Blank rows = not tested)  HAND FUNCTION: Grip strength: Right: 76 lbs; Left: 55 lbs, Lateral pinch: Right: 15 lbs, Left: 11 lbs, and 3 point pinch: Right: 21 lbs, Left: 14 lbs 03/08/24: Grip strength: Right: 85 lbs, Left: 63 lbs; Lateral pinch: Right: 24 lbs, Left: 13 lbs, and 3 point pinch: Right: 25 lbs, Left: 16 lbs   COORDINATION: 9 Hole Peg test: Right: 26 sec; Left: 10 sec 03/08/24: Left: 58 sec.   SENSATION:  12/17/23 Light touch: decreased sensation and numbness in L hand 5th digit Stereognosis:   Hot/Cold:   Proprioception: intact  EDEMA: mild swelling to L hand  COGNITION: Overall cognitive status: Within functional limits for tasks assessed  VISION: Subjective report: baseline Baseline vision: No visual deficits  VISION ASSESSMENT: WFL                                                                                                 TREATMENT DATE: 04/14/24  Moist heat applied to T-spine for pain management throughout session  Therapeutic Activity: -Facilitated L hand FMC and hand strengthening working with 1 velcro blocks and removing them from l-3 communications.  Targeted digit opposition, lateral and 3 point pinch, digit ext, and digit abd/add of digits 1-5 against velcro resistance, with OT providing intermittent stability of neighboring digits to reduce substitution patterns.   PATIENT EDUCATION: Education  details: HEP Person educated: Patient Education method: Leisure Centre Manager cues Education comprehension: verbalized understanding  HOME EXERCISE PROGRAM: Blue theraputty [facility] - coins at tabletop Intrinsic hand strengthening  GOALS: Goals reviewed with patient? Yes  SHORT TERM GOALS: Target date: 04/19/24  Pt will IND complete HEP for functional strenghening of non-dominant LUE. Baseline: not initiated; 03/08/24: Min vc with recently added exercises (intrinsic hand strengthening) Goal status: in progress   LONG TERM GOALS: Target date: 05/31/24  Pt will increase L lateral pinch strength by 5 # of force to securely hold a wrench during leisure activities. Baseline: L pinch 11#; 03/08/24: L 13# Goal status: in progress  2.  Pt will increase L grip strength by 15# to wash dishes without dropping items.  Baseline: L grip 55#; 03/08/24: L grip 63# (pt reports having to keep eyes on dishes to avoid dropping any d/t sensory deficits) Goal status: in progress   3.  Pt will improve LUE coordination by 30 seconds to independently manipulate fasteners during dressing tasks. Baseline: L 1'10; 03/08/24: L 58 sec  Goal status: in progress  4.  Pt will improve dynamic seated balance to don B shoes independently on edge of bed without loss of balance. Baseline: requires chair with armrest to don shoes with standby assist; 03/08/24: Consistently sits in chair with arm rests (without SBA) for increased stability Goal status: d/c  5.  Pt will increase L hand lumbrical strength in order to securely hold and stabilize flat objects (notebook) in L hand.  Baseline: 03/08/24: L hand 4th and 5th digit  PIPs flex and require manual joint blocking or oval 8 splint to prevent flexion.     Goal status: New  ASSESSMENT: CLINICAL IMPRESSION:  Pt continues to present with L hand weakness, specifically with L hand digit abd/add in 2nd and 3rd digits, and difficulty sustaining lumbrical grasp position in ulnar  digits, though improving.  Pt requires intermittent stabilization of wrist in order to reduce substitution when targeting intrinsic strengthening, as well as stabilization of neighboring digits when working to isolate a single digit.  Pt continues to benefit from OT services to work on L hand strength and L [facility] skills to improve functional independence of ADL/IADL tasks at home.   PERFORMANCE DEFICITS: in functional skills including ADLs, IADLs, coordination, dexterity, sensation, and edema and psychosocial skills including environmental adaptation.   IMPAIRMENTS: are limiting patient from ADLs, IADLs, and leisure.   CO-MORBIDITIES: may have co-morbidities  that affects occupational performance. Patient will benefit from skilled OT to address above impairments and improve overall function.  MODIFICATION OR ASSISTANCE TO COMPLETE EVALUATION: No modification of tasks or assist necessary to complete an evaluation.  OT OCCUPATIONAL PROFILE AND HISTORY: Problem focused assessment: Including review of records relating to presenting problem.  CLINICAL DECISION MAKING: LOW - limited treatment options, no task modification necessary  REHAB POTENTIAL: Good  EVALUATION COMPLEXITY: Moderate    PLAN:  OT FREQUENCY: 1-2x/week  OT DURATION: 12 weeks  PLANNED INTERVENTIONS: 97168 OT Re-evaluation, 97535 self care/ADL training, 02889 therapeutic exercise, 97530 therapeutic activity, and 97112 neuromuscular re-education  RECOMMENDED OTHER SERVICES: PT  CONSULTED AND AGREED WITH PLAN OF CARE: Patient and family member/caregiver  PLAN FOR NEXT SESSION: see above  Inocente Blazing, MS, OTR/L  04/18/2024, 7:17 AM

## 2024-04-19 ENCOUNTER — Ambulatory Visit

## 2024-04-19 DIAGNOSIS — R278 Other lack of coordination: Secondary | ICD-10-CM

## 2024-04-19 DIAGNOSIS — M6281 Muscle weakness (generalized): Secondary | ICD-10-CM

## 2024-04-19 NOTE — Therapy (Unsigned)
 OUTPATIENT OCCUPATIONAL THERAPY NEURO TREATMENT NOTE  Patient Name: Thomas [name] MRN: [id number] DOB:[birth year], 89 y.o., male Today's Date: 04/20/2024  PCP: Charlie Forte REFERRING PROVIDER: [name]  END OF SESSION:  OT End of Session - 04/20/24 2016     Visit Number 19    Number of Visits 34    Date for Recertification  05/31/24    Authorization Time Period Reporting period beginning 03/08/24    OT Start Time 1150    OT Stop Time 1230    OT Time Calculation (min) 40 min    Activity Tolerance Patient tolerated treatment well    Behavior During Therapy [facility] for tasks assessed/performed         Past Medical History:  Diagnosis Date   Aortic insufficiency    a. 07/2023 Echo: Mod AI; b. 09/2023 Echo: Mild-mod AI.   Aortic valve disorders    Arthritis    Basal cell carcinoma    face, nose left shoulder, left arm (06/19/2016)   Basal cell carcinoma 09/13/2020   right temple   BBB (bundle branch block)    hx right   CAD (coronary artery disease)    a. 10/2017 Cath: mod, nonobs dzs; b. 02/2021 Cath: mod, nonobs dzs; c. 11/2022 Cath: LAD 30ost/p, 5m, D1 min irregs, LCX 65p/m, OM1 min irregs, OM2 40, OM3 nl, RCA 30p, 73m, 40d w/ 40 in side branch-->Med rx; d. 11/2023 PCI: LAD 90ost/p (3.5x15 Onyx Frontier DES). Otw stable anatomy.   Chronic back pain    neck, thoracic, lower back (06/19/2016)   CKD (chronic kidney disease), stage III (HCC)    Complete heart block (HCC) 06/2016   a. 06/2016 s/p MDT PPM; b. 01/2023 s/p upgrade to MDT CRT-P (ser # [id number]).   GERD (gastroesophageal reflux disease)    Gout    Heart block    I've had type I, II Wenke before now (06/19/2016)   History of gout    History of hiatal hernia    self dx'd (06/19/2016)   Hyperlipidemia    Hypertension    Lyme disease    dx'd by me 2003; cx's showed dx 08/2015   Migraine    3-4/year (06/19/2016)   Mitral regurgitation    a. 07/2023 Echo: Mod MR.   NICM (nonischemic cardiomyopathy - in  setting of RV pacing) (HCC)    a. 02/2019 Echo: EF 45-50%; b. 10/2022 Echo: EF 30-35%; c. 01/2023 CRT-P upgrade; e. 07/2023 Echo: EF 40-45%, glob HK; f. 10/2023 Echo: EF of 55 to 60% with grade 1 DD, nl RV fxn, triv MR, mild to mod AI, and mildly dilated ascending aorta at 42 mm.   PAF (paroxysmal atrial fibrillation) (HCC)    a. CHA2DS2VASc = 7--> eliquis ; b. On tikosyn .   Paroxysmal atrial flutter (HCC)    Presence of permanent cardiac pacemaker    PVC's (premature ventricular contractions)    Renal cancer, left (HCC) 2006   S/P cryotherapy   Spinal stenosis    cervical, 1 thoracic, lumbar (06/19/2016)   Squamous carcinoma    face, nose left shoulder, left arm (06/19/2016)   Stroke (HCC)    TIA (transient ischemic attack) 06/14/2016   I'm not sure that's what it was (06/25/2016)   Visit for monitoring Tikosyn  therapy 09/09/2017   Past Surgical History:  Procedure Laterality Date   ANKLE FRACTURE SURGERY Right 1967   BACK SURGERY  05/07/2020   BASAL CELL CARCINOMA EXCISION     face, nose left shoulder, left arm  BIOPSY PROSTATE  2001 & 2003   BIV UPGRADE N/A 01/10/2023   Procedure: BIV UPGRADE;  Surgeon: Fernande Elspeth BROCKS, MD;  Location: [facility] INVASIVE CV LAB;  Service: Cardiovascular;  Laterality: N/A;   CARDIAC CATHETERIZATION  1990's   CARDIOVERSION N/A 09/11/2017   Procedure: CARDIOVERSION;  Surgeon: Pietro Redell RAMAN, MD;  Location: [facility] ENDOSCOPY;  Service: Cardiovascular;  Laterality: N/A;   CORONARY STENT INTERVENTION N/A 11/10/2023   Procedure: CORONARY STENT INTERVENTION;  Surgeon: Darron Deatrice LABOR, MD;  Location: [facility] INVASIVE CV LAB;  Service: Cardiovascular;  Laterality: N/A;   FRACTURE SURGERY     HOLEP-LASER ENUCLEATION OF THE PROSTATE WITH MORCELLATION N/A 07/10/2020   Procedure: HOLEP-LASER ENUCLEATION OF THE PROSTATE WITH MORCELLATION;  Surgeon: Penne Knee, MD;  Location: [facility] ORS;  Service: Urology;  Laterality: N/A;   INGUINAL HERNIA REPAIR Left 2012   INSERT /  REPLACE / REMOVE PACEMAKER  06/19/2016   LAPAROSCOPIC ABLATION RENAL MASS     LEFT HEART CATH AND CORONARY ANGIOGRAPHY Left 10/23/2017   Procedure: LEFT HEART CATH AND CORONARY ANGIOGRAPHY;  Surgeon: Darron Deatrice LABOR, MD;  Location: [facility] INVASIVE CV LAB;  Service: Cardiovascular;  Laterality: Left;   LEFT HEART CATH AND CORONARY ANGIOGRAPHY Left 02/26/2021   Procedure: LEFT HEART CATH AND CORONARY ANGIOGRAPHY;  Surgeon: Darron Deatrice LABOR, MD;  Location: [facility] INVASIVE CV LAB;  Service: Cardiovascular;  Laterality: Left;   LEFT HEART CATH AND CORONARY ANGIOGRAPHY Left 11/10/2023   Procedure: LEFT HEART CATH AND CORONARY ANGIOGRAPHY;  Surgeon: Darron Deatrice LABOR, MD;  Location: [facility] INVASIVE CV LAB;  Service: Cardiovascular;  Laterality: Left;   PACEMAKER IMPLANT N/A 06/19/2016   Procedure: Pacemaker Implant;  Surgeon: Elspeth BROCKS Fernande, MD;  Location: [facility] INVASIVE CV LAB;  Service: Cardiovascular;  Laterality: N/A;   pacemasker     PROSTATE SURGERY     RIGHT/LEFT HEART CATH AND CORONARY ANGIOGRAPHY Bilateral 11/25/2022   Procedure: RIGHT/LEFT HEART CATH AND CORONARY ANGIOGRAPHY;  Surgeon: Darron Deatrice LABOR, MD;  Location: [facility] INVASIVE CV LAB;  Service: Cardiovascular;  Laterality: Bilateral;   SQUAMOUS CELL CARCINOMA EXCISION     face, nose left shoulder, left arm   TONSILLECTOMY AND ADENOIDECTOMY     Patient Active Problem List   Diagnosis Date Noted   Angina pectoris 11/10/2023   Weakness of left foot 10/29/2023   CKD stage 3b, GFR 30-44 ml/min (HCC) 10/29/2023   Obstructive sleep apnea 10/29/2023   Combined systolic and diastolic congestive heart failure (HCC) 10/29/2023   AKI (acute kidney injury) 04/29/2023   UTI (urinary tract infection) 04/29/2023   Chronic systolic heart failure (HCC) 11/25/2022   Sick sinus syndrome (HCC) 02/26/2022   Closed fracture of second lumbar vertebra (HCC) 09/26/2021   Dysphagia, unspecified 09/26/2021   Heart block 09/26/2021   Heartburn 09/26/2021    History of lumbar fusion 09/26/2021   Inflammation of sacroiliac joint 09/26/2021   Other symptoms and signs involving cognitive functions and awareness 09/26/2021   Prediabetes 09/26/2021   Low back pain 09/26/2021   Renal cell carcinoma (HCC) 09/26/2021   Essential hypertension 09/26/2021   Cerebrovascular disease 09/26/2021   Transient cerebral ischemic attack, unspecified 09/26/2021   Cerebrovascular disease, unspecified 09/26/2021   Transient ischemic attack 09/26/2021   Unstable angina (HCC)    Lumbar burst fracture (HCC) 05/04/2020   Hypogonadism male 10/10/2019   Persistent atrial fibrillation (HCC) 01/12/2019   Acute low back pain 11/04/2017   Abnormal screening cardiac CT    Visit for monitoring Tikosyn  therapy 09/09/2017  History of TIA (transient ischemic attack) 08/29/2016   Paroxysmal atrial fibrillation (HCC) 07/23/2016   CAD (coronary artery disease) 07/23/2016   Snores 06/27/2016   S/P placement of cardiac pacemaker 06/25/2016   Hemiparesis (HCC) 06/25/2016   Acute ischemic stroke (HCC)    Left-sided weakness    Hemisensory loss    Dysarthria    Stroke-like symptoms    Complete heart block (HCC)    Pain in thoracic spine    Orthostasis    Lethargy    Vertebral artery occlusion, left    TIA (transient ischemic attack) 06/14/2016   Arthritis 02/06/2015   Esophageal reflux 02/06/2015   Arthritis urica 02/06/2015   Cannot sleep 02/06/2015   Arthritis, degenerative 02/06/2015   Adenocarcinoma, renal cell (HCC) 02/06/2015   Benign prostatic hyperplasia with lower urinary tract symptoms 11/21/2014   Personal history of other malignant neoplasm of kidney 11/21/2014   History of Lyme disease 05/06/2014   Palpitations 12/31/2013   Hyperlipidemia 11/02/2010   HYPERTENSION, BENIGN 04/16/2010   ONSET DATE: 11/10/23  REFERRING DIAG: CVA  THERAPY DIAG:  Muscle weakness (generalized)  Other lack of coordination  Rationale for Evaluation and Treatment:  Rehabilitation  SUBJECTIVE:  SUBJECTIVE STATEMENT: Pt reports he seems to have tweaked his back a little bit and is having a little more pain today.  Responded well to moist heat throughout session.    Pt accompanied by: self and significant other  PERTINENT HISTORY: 89 year old male who was admitted to hospital on 11/10/23 for management of unstable angina after pt developed clumsiness of the left arm after cardiac catheterization 7/7, workup for TIA. Per neurology note symptoms secondary to relative cerebral hypoperfusion in the setting of heart disease and right carotid artery and M1 stenosis. PMhx: PAF on Eliquis , HTN, HLD, prediabetes, OSA on CPAP, dominant left vertebral artery occlusion, TIA, SSS s/p PPM, BPH, renal CA, lumbar fusion, A-fib.  PRECAUTIONS: None  WEIGHT BEARING RESTRICTIONS: No  PAIN: 04/19/24: back pain 3/10  FALLS: Has patient fallen in last 6 months? Yes. Number of falls 4  LIVING ENVIRONMENT: Lives with: lives with their spouse Lives in: House/apartment Stairs: Yes: Internal: 2 steps; none Has following equipment at home: Single point cane and Walker - 2 wheeled  PLOF: Independent with basic ADLs  PATIENT GOALS: to go to [facility] for more detailed neuro testing and return to function in LUE  OBJECTIVE:  Note: Objective measures were completed at Evaluation unless otherwise noted.  HAND DOMINANCE: Right  ADLs: Overall ADLs: MIN A Transfers/ambulation related to ADLs: Eating: IND - uses dominant R Grooming: SETUP UB Dressing: SETUP - buttons assist LB Dressing: MIN - hiking pants  Toileting: IND Bathing: IND Tub Shower transfers: IND Equipment: none  IADLs: Shopping: spouse completes as needed Light housekeeping: spouse completes as needed Meal Prep: assist from spouse Community mobility: SPC/RW as needed Medication management: IND Landscape architect: IND Handwriting: 100% legible R hand  MOBILITY STATUS: Independent and Hx of  falls  POSTURE COMMENTS:  rounded shoulders and forward head Sitting balance: Sits without UE support up to 30 sec  ACTIVITY TOLERANCE: Activity tolerance: IND - tolerates 20 min standing activities   FUNCTIONAL OUTCOME MEASURES:    UPPER EXTREMITY ROM:   BUE AROM WFL   UPPER EXTREMITY MMT:     MMT Right eval Left eval  Shoulder flexion    Shoulder abduction    Shoulder adduction    Shoulder extension    Shoulder internal rotation    Shoulder external rotation  Middle trapezius    Lower trapezius    Elbow flexion    Elbow extension    Wrist flexion    Wrist extension    Wrist ulnar deviation    Wrist radial deviation    Wrist pronation    Wrist supination    (Blank rows = not tested)  HAND FUNCTION: Grip strength: Right: 76 lbs; Left: 55 lbs, Lateral pinch: Right: 15 lbs, Left: 11 lbs, and 3 point pinch: Right: 21 lbs, Left: 14 lbs 03/08/24: Grip strength: Right: 85 lbs, Left: 63 lbs; Lateral pinch: Right: 24 lbs, Left: 13 lbs, and 3 point pinch: Right: 25 lbs, Left: 16 lbs   COORDINATION: 9 Hole Peg test: Right: 26 sec; Left: 10 sec 03/08/24: Left: 58 sec.   SENSATION:  12/17/23 Light touch: decreased sensation and numbness in L hand 5th digit Stereognosis:   Hot/Cold:   Proprioception: intact  EDEMA: mild swelling to L hand  COGNITION: Overall cognitive status: Within functional limits for tasks assessed  VISION: Subjective report: baseline Baseline vision: No visual deficits  VISION ASSESSMENT: WFL                                                                                                 TREATMENT DATE: 04/19/24  Moist heat applied to T-spine for pain management throughout session  Therapeutic Activity: -Facilitated LUE GMC/FMC with placement of push pins into cork and foam boards.  OT provided constant activity grading, attempting first with cork board on incline, and downgrading to foam board, upgrading to foam board with 2 layers of  paper.  Facilitated forward reaching patterns, adjusting reaching distances in front of pt on table top to a target with an extended elbow.  Pt practiced pushing pins using R hand to set up pin in L hand.  L hand utilized 2 and 3 point pinch patterns, with additional practice stabilizing IF to push an elevated pin further into board.  Pt was given targets on paper to increase challenge with reaching toward a target.   PATIENT EDUCATION: Education details: HEP Person educated: Patient Education method: Leisure Centre Manager cues Education comprehension: verbalized understanding  HOME EXERCISE PROGRAM: Blue theraputty [facility] - coins at tabletop Intrinsic hand strengthening  GOALS: Goals reviewed with patient? Yes  SHORT TERM GOALS: Target date: 04/19/24  Pt will IND complete HEP for functional strenghening of non-dominant LUE. Baseline: not initiated; 03/08/24: Min vc with recently added exercises (intrinsic hand strengthening) Goal status: in progress   LONG TERM GOALS: Target date: 05/31/24  Pt will increase L lateral pinch strength by 5 # of force to securely hold a wrench during leisure activities. Baseline: L pinch 11#; 03/08/24: L 13# Goal status: in progress  2.  Pt will increase L grip strength by 15# to wash dishes without dropping items.  Baseline: L grip 55#; 03/08/24: L grip 63# (pt reports having to keep eyes on dishes to avoid dropping any d/t sensory deficits) Goal status: in progress   3.  Pt will improve LUE coordination by 30 seconds to independently manipulate fasteners during dressing tasks. Baseline: L 1'10;  03/08/24: L 58 sec  Goal status: in progress  4.  Pt will improve dynamic seated balance to don B shoes independently on edge of bed without loss of balance. Baseline: requires chair with armrest to don shoes with standby assist; 03/08/24: Consistently sits in chair with arm rests (without SBA) for increased stability Goal status: d/c  5.  Pt will increase L  hand lumbrical strength in order to securely hold and stabilize flat objects (notebook) in L hand.  Baseline: 03/08/24: L hand 4th and 5th digit PIPs flex and require manual joint blocking or oval 8 splint to prevent flexion.     Goal status: New  ASSESSMENT: CLINICAL IMPRESSION:  Pt continues to show improved precision with reaching toward a target, but struggles with forceful pinching and maintaining 2 and 3 point prehension patterns around a small object (push pin).  Pt was able to push a few pins into cork board placed flat on table top, but when elevated on incline, pt was unable to sustain pinch with enough force against cork board.  Pt was successful pushing pins into foam board, and was able to increase resistance with 1 layer of paper and an additional layer of cardboard overtop foam board.  Pt continues to benefit from OT services to work on L hand strength and L [facility] skills to improve functional independence of ADL/IADL tasks at home.   PERFORMANCE DEFICITS: in functional skills including ADLs, IADLs, coordination, dexterity, sensation, and edema and psychosocial skills including environmental adaptation.   IMPAIRMENTS: are limiting patient from ADLs, IADLs, and leisure.   CO-MORBIDITIES: may have co-morbidities  that affects occupational performance. Patient will benefit from skilled OT to address above impairments and improve overall function.  MODIFICATION OR ASSISTANCE TO COMPLETE EVALUATION: No modification of tasks or assist necessary to complete an evaluation.  OT OCCUPATIONAL PROFILE AND HISTORY: Problem focused assessment: Including review of records relating to presenting problem.  CLINICAL DECISION MAKING: LOW - limited treatment options, no task modification necessary  REHAB POTENTIAL: Good  EVALUATION COMPLEXITY: Moderate    PLAN:  OT FREQUENCY: 1-2x/week  OT DURATION: 12 weeks  PLANNED INTERVENTIONS: 97168 OT Re-evaluation, 97535 self care/ADL training, 02889  therapeutic exercise, 97530 therapeutic activity, and 97112 neuromuscular re-education  RECOMMENDED OTHER SERVICES: PT  CONSULTED AND AGREED WITH PLAN OF CARE: Patient and family member/caregiver  PLAN FOR NEXT SESSION: see above  Inocente Blazing, MS, OTR/L  04/20/2024, 8:17 PM

## 2024-04-21 ENCOUNTER — Ambulatory Visit

## 2024-04-21 DIAGNOSIS — M6281 Muscle weakness (generalized): Secondary | ICD-10-CM

## 2024-04-21 DIAGNOSIS — R278 Other lack of coordination: Secondary | ICD-10-CM

## 2024-04-21 NOTE — Therapy (Signed)
 OUTPATIENT OCCUPATIONAL THERAPY NEURO TREATMENT NOTE Reporting period beginning 03/08/24-04/20/24  Patient Name: Thomas [name] MRN: [id number] DOB:22-Nov-1932, 89 y.o., male Today's Date: 04/21/2024  PCP: Charlie Forte REFERRING PROVIDER: [name]  END OF SESSION:  OT End of Session - 04/21/24 1151     Visit Number 20    Number of Visits 34    Date for Recertification  05/31/24    Authorization Time Period Reporting period beginning 03/08/24    OT Start Time 1146    OT Stop Time 1230    OT Time Calculation (min) 44 min    Activity Tolerance Patient tolerated treatment well    Behavior During Therapy [facility] for tasks assessed/performed         Past Medical History:  Diagnosis Date   Aortic insufficiency    a. 07/2023 Echo: Mod AI; b. 09/2023 Echo: Mild-mod AI.   Aortic valve disorders    Arthritis    Basal cell carcinoma    face, nose left shoulder, left arm (06/19/2016)   Basal cell carcinoma 09/13/2020   right temple   BBB (bundle branch block)    hx right   CAD (coronary artery disease)    a. 10/2017 Cath: mod, nonobs dzs; b. 02/2021 Cath: mod, nonobs dzs; c. 11/2022 Cath: LAD 30ost/p, 64m, D1 min irregs, LCX 65p/m, OM1 min irregs, OM2 40, OM3 nl, RCA 30p, 70m, 40d w/ 40 in side branch-->Med rx; d. 11/2023 PCI: LAD 90ost/p (3.5x15 Onyx Frontier DES). Otw stable anatomy.   Chronic back pain    neck, thoracic, lower back (06/19/2016)   CKD (chronic kidney disease), stage III (HCC)    Complete heart block (HCC) 06/2016   a. 06/2016 s/p MDT PPM; b. 01/2023 s/p upgrade to MDT CRT-P (ser # [id number]).   GERD (gastroesophageal reflux disease)    Gout    Heart block    I've had type I, II Wenke before now (06/19/2016)   History of gout    History of hiatal hernia    self dx'd (06/19/2016)   Hyperlipidemia    Hypertension    Lyme disease    dx'd by me 2003; cx's showed dx 08/2015   Migraine    3-4/year (06/19/2016)   Mitral regurgitation    a. 07/2023 Echo: Mod  MR.   NICM (nonischemic cardiomyopathy - in setting of RV pacing) (HCC)    a. 02/2019 Echo: EF 45-50%; b. 10/2022 Echo: EF 30-35%; c. 01/2023 CRT-P upgrade; e. 07/2023 Echo: EF 40-45%, glob HK; f. 10/2023 Echo: EF of 55 to 60% with grade 1 DD, nl RV fxn, triv MR, mild to mod AI, and mildly dilated ascending aorta at 42 mm.   PAF (paroxysmal atrial fibrillation) (HCC)    a. CHA2DS2VASc = 7--> eliquis ; b. On tikosyn .   Paroxysmal atrial flutter (HCC)    Presence of permanent cardiac pacemaker    PVC's (premature ventricular contractions)    Renal cancer, left (HCC) 2006   S/P cryotherapy   Spinal stenosis    cervical, 1 thoracic, lumbar (06/19/2016)   Squamous carcinoma    face, nose left shoulder, left arm (06/19/2016)   Stroke (HCC)    TIA (transient ischemic attack) 06/14/2016   I'm not sure that's what it was (06/25/2016)   Visit for monitoring Tikosyn  therapy 09/09/2017   Past Surgical History:  Procedure Laterality Date   ANKLE FRACTURE SURGERY Right 1967   BACK SURGERY  05/07/2020   BASAL CELL CARCINOMA EXCISION     face, nose left  shoulder, left arm   BIOPSY PROSTATE  2001 & 2003   BIV UPGRADE N/A 01/10/2023   Procedure: BIV UPGRADE;  Surgeon: Fernande Elspeth BROCKS, MD;  Location: [facility] INVASIVE CV LAB;  Service: Cardiovascular;  Laterality: N/A;   CARDIAC CATHETERIZATION  1990's   CARDIOVERSION N/A 09/11/2017   Procedure: CARDIOVERSION;  Surgeon: Pietro Redell RAMAN, MD;  Location: [facility] ENDOSCOPY;  Service: Cardiovascular;  Laterality: N/A;   CORONARY STENT INTERVENTION N/A 11/10/2023   Procedure: CORONARY STENT INTERVENTION;  Surgeon: Darron Deatrice LABOR, MD;  Location: [facility] INVASIVE CV LAB;  Service: Cardiovascular;  Laterality: N/A;   FRACTURE SURGERY     HOLEP-LASER ENUCLEATION OF THE PROSTATE WITH MORCELLATION N/A 07/10/2020   Procedure: HOLEP-LASER ENUCLEATION OF THE PROSTATE WITH MORCELLATION;  Surgeon: Penne Knee, MD;  Location: [facility] ORS;  Service: Urology;  Laterality: N/A;    INGUINAL HERNIA REPAIR Left 2012   INSERT / REPLACE / REMOVE PACEMAKER  06/19/2016   LAPAROSCOPIC ABLATION RENAL MASS     LEFT HEART CATH AND CORONARY ANGIOGRAPHY Left 10/23/2017   Procedure: LEFT HEART CATH AND CORONARY ANGIOGRAPHY;  Surgeon: Darron Deatrice LABOR, MD;  Location: [facility] INVASIVE CV LAB;  Service: Cardiovascular;  Laterality: Left;   LEFT HEART CATH AND CORONARY ANGIOGRAPHY Left 02/26/2021   Procedure: LEFT HEART CATH AND CORONARY ANGIOGRAPHY;  Surgeon: Darron Deatrice LABOR, MD;  Location: [facility] INVASIVE CV LAB;  Service: Cardiovascular;  Laterality: Left;   LEFT HEART CATH AND CORONARY ANGIOGRAPHY Left 11/10/2023   Procedure: LEFT HEART CATH AND CORONARY ANGIOGRAPHY;  Surgeon: Darron Deatrice LABOR, MD;  Location: [facility] INVASIVE CV LAB;  Service: Cardiovascular;  Laterality: Left;   PACEMAKER IMPLANT N/A 06/19/2016   Procedure: Pacemaker Implant;  Surgeon: Elspeth BROCKS Fernande, MD;  Location: [facility] INVASIVE CV LAB;  Service: Cardiovascular;  Laterality: N/A;   pacemasker     PROSTATE SURGERY     RIGHT/LEFT HEART CATH AND CORONARY ANGIOGRAPHY Bilateral 11/25/2022   Procedure: RIGHT/LEFT HEART CATH AND CORONARY ANGIOGRAPHY;  Surgeon: Darron Deatrice LABOR, MD;  Location: [facility] INVASIVE CV LAB;  Service: Cardiovascular;  Laterality: Bilateral;   SQUAMOUS CELL CARCINOMA EXCISION     face, nose left shoulder, left arm   TONSILLECTOMY AND ADENOIDECTOMY     Patient Active Problem List   Diagnosis Date Noted   Angina pectoris 11/10/2023   Weakness of left foot 10/29/2023   CKD stage 3b, GFR 30-44 ml/min (HCC) 10/29/2023   Obstructive sleep apnea 10/29/2023   Combined systolic and diastolic congestive heart failure (HCC) 10/29/2023   AKI (acute kidney injury) 04/29/2023   UTI (urinary tract infection) 04/29/2023   Chronic systolic heart failure (HCC) 11/25/2022   Sick sinus syndrome (HCC) 02/26/2022   Closed fracture of second lumbar vertebra (HCC) 09/26/2021   Dysphagia, unspecified 09/26/2021   Heart  block 09/26/2021   Heartburn 09/26/2021   History of lumbar fusion 09/26/2021   Inflammation of sacroiliac joint 09/26/2021   Other symptoms and signs involving cognitive functions and awareness 09/26/2021   Prediabetes 09/26/2021   Low back pain 09/26/2021   Renal cell carcinoma (HCC) 09/26/2021   Essential hypertension 09/26/2021   Cerebrovascular disease 09/26/2021   Transient cerebral ischemic attack, unspecified 09/26/2021   Cerebrovascular disease, unspecified 09/26/2021   Transient ischemic attack 09/26/2021   Unstable angina (HCC)    Lumbar burst fracture (HCC) 05/04/2020   Hypogonadism male 10/10/2019   Persistent atrial fibrillation (HCC) 01/12/2019   Acute low back pain 11/04/2017   Abnormal screening cardiac CT    Visit  for monitoring Tikosyn  therapy 09/09/2017   History of TIA (transient ischemic attack) 08/29/2016   Paroxysmal atrial fibrillation (HCC) 07/23/2016   CAD (coronary artery disease) 07/23/2016   Snores 06/27/2016   S/P placement of cardiac pacemaker 06/25/2016   Hemiparesis (HCC) 06/25/2016   Acute ischemic stroke (HCC)    Left-sided weakness    Hemisensory loss    Dysarthria    Stroke-like symptoms    Complete heart block (HCC)    Pain in thoracic spine    Orthostasis    Lethargy    Vertebral artery occlusion, left    TIA (transient ischemic attack) 06/14/2016   Arthritis 02/06/2015   Esophageal reflux 02/06/2015   Arthritis urica 02/06/2015   Cannot sleep 02/06/2015   Arthritis, degenerative 02/06/2015   Adenocarcinoma, renal cell (HCC) 02/06/2015   Benign prostatic hyperplasia with lower urinary tract symptoms 11/21/2014   Personal history of other malignant neoplasm of kidney 11/21/2014   History of Lyme disease 05/06/2014   Palpitations 12/31/2013   Hyperlipidemia 11/02/2010   HYPERTENSION, BENIGN 04/16/2010   ONSET DATE: 11/10/23  REFERRING DIAG: CVA  THERAPY DIAG:  Muscle weakness (generalized)  Other lack of  coordination  Rationale for Evaluation and Treatment: Rehabilitation  SUBJECTIVE:  SUBJECTIVE STATEMENT: Pt in agreement with plan to hold OT for pt to work on HEP and recheck status/progress near end of cert.  Pt accompanied by: self and significant other  PERTINENT HISTORY: 89 year old male who was admitted to hospital on 11/10/23 for management of unstable angina after pt developed clumsiness of the left arm after cardiac catheterization 7/7, workup for TIA. Per neurology note symptoms secondary to relative cerebral hypoperfusion in the setting of heart disease and right carotid artery and M1 stenosis. PMhx: PAF on Eliquis , HTN, HLD, prediabetes, OSA on CPAP, dominant left vertebral artery occlusion, TIA, SSS s/p PPM, BPH, renal CA, lumbar fusion, A-fib.  PRECAUTIONS: None  WEIGHT BEARING RESTRICTIONS: No  PAIN: 04/21/24: back pain 4/10  FALLS: Has patient fallen in last 6 months? Yes. Number of falls 4  LIVING ENVIRONMENT: Lives with: lives with their spouse Lives in: House/apartment Stairs: Yes: Internal: 2 steps; none Has following equipment at home: Single point cane and Walker - 2 wheeled  PLOF: Independent with basic ADLs  PATIENT GOALS: to go to [facility] for more detailed neuro testing and return to function in LUE  OBJECTIVE:  Note: Objective measures were completed at Evaluation unless otherwise noted.  HAND DOMINANCE: Right  ADLs: Overall ADLs: MIN A Transfers/ambulation related to ADLs: Eating: IND - uses dominant R Grooming: SETUP UB Dressing: SETUP - buttons assist LB Dressing: MIN - hiking pants  Toileting: IND Bathing: IND Tub Shower transfers: IND Equipment: none  IADLs: Shopping: spouse completes as needed Light housekeeping: spouse completes as needed Meal Prep: assist from spouse Community mobility: SPC/RW as needed Medication management: IND Landscape architect: IND Handwriting: 100% legible R hand  MOBILITY STATUS: Independent and Hx of  falls  POSTURE COMMENTS:  rounded shoulders and forward head Sitting balance: Sits without UE support up to 30 sec  ACTIVITY TOLERANCE: Activity tolerance: IND - tolerates 20 min standing activities   FUNCTIONAL OUTCOME MEASURES:    UPPER EXTREMITY ROM:   BUE AROM WFL   UPPER EXTREMITY MMT:     MMT Right eval Left eval Right 04/21/24 Left 04/21/24  Shoulder flexion   5 5  Shoulder abduction   5 5  Shoulder adduction      Shoulder extension  Shoulder internal rotation   5 5  Shoulder external rotation   5   Middle trapezius      Lower trapezius      Elbow flexion   5 5  Elbow extension   5 5  Wrist flexion   5 5  Wrist extension   5 5  Wrist ulnar deviation      Wrist radial deviation      Wrist pronation      Wrist supination      (Blank rows = not tested)  HAND FUNCTION: Grip strength: Right: 76 lbs; Left: 55 lbs, Lateral pinch: Right: 15 lbs, Left: 11 lbs, and 3 point pinch: Right: 21 lbs, Left: 14 lbs 03/08/24: Grip strength: Right: 85 lbs, Left: 63 lbs; Lateral pinch: Right: 24 lbs, Left: 13 lbs, and 3 point pinch: Right: 25 lbs, Left: 16 lbs  04/21/24: Grip strength: Left: 56 lbs; Lateral pinch: Left: 8 lbs, 3 point pinch: 4 lbs (fingers slipping, measured on Saehan device with fingers staying in place better on rubber bulb; Saehan not available d/t out for recalibration)   COORDINATION: 9 Hole Peg test: Right: 26 sec; Left: 10 sec 03/08/24: Left: 58 sec.  04/21/24: Left: Trial 1:(warm up): 1 min 1 sec, Trial 2: 43 sec., Trial 3: 54 sec   SENSATION:  12/17/23 Light touch: decreased sensation and numbness in L hand 5th digit Stereognosis:   Hot/Cold:   Proprioception: intact  EDEMA: mild swelling to L hand  COGNITION: Overall cognitive status: Within functional limits for tasks assessed  VISION: Subjective report: baseline Baseline vision: No visual deficits  VISION ASSESSMENT: WFL                                                                                                  TREATMENT DATE: 04/21/24  Moist heat applied to T-spine for pain management throughout session  Therapeutic Activity: -Objective measures taken and goals updated for progress note.  Self Care: -Reviewed progress towards goals, areas of focus, and option to hold and focus on HEP with recheck end of cert.  Provided updated visual handouts for areas of focus for R hand strengthening with pt verbalizing understanding.   PATIENT EDUCATION: Education details: HEP, progress towards goals, poc Person educated: Patient Education method: Explanation and Verbal cues Education comprehension: verbalized understanding  HOME EXERCISE PROGRAM: Blue theraputty [facility] - coins at tabletop Intrinsic hand strengthening  GOALS: Goals reviewed with patient? Yes  SHORT TERM GOALS: Target date: 04/19/24  Pt will IND complete HEP for functional strenghening of non-dominant LUE. Baseline: not initiated; 03/08/24: Min vc with recently added exercises (intrinsic hand strengthening); 04/20/24: Indep with visual handouts Goal status: achieved   LONG TERM GOALS: Target date: 05/31/24  Pt will increase L lateral pinch strength by 5 # of force to securely hold a wrench during leisure activities. Baseline: L pinch 11#; 03/08/24: L 13#; 04/20/24: L: 8# today on standard pinch gauge with fingers slipping (decline from 13#, but alternate pinch gauge used previous session) Goal status: in progress  2.  Pt will increase L grip strength by 15#  to wash dishes without dropping items.  Baseline: L grip 55#; 03/08/24: L grip 63# (pt reports having to keep eyes on dishes to avoid dropping any d/t sensory deficits); 04/20/24: L 56 lbs (slight decline)  Goal status: in progress   3.  Pt will improve LUE coordination by 30 seconds to independently manipulate fasteners during dressing tasks. Baseline: L 1'10; 03/08/24: L 58 sec; 04/20/24: L 43 sec (best of 3 trials; not consistent) Goal status:  in progress  4.  Pt will improve dynamic seated balance to don B shoes independently on edge of bed without loss of balance. Baseline: requires chair with armrest to don shoes with standby assist; 03/08/24: Consistently sits in chair with arm rests (without SBA) for increased stability Goal status: d/c  5.  Pt will increase L hand lumbrical strength in order to securely hold and stabilize flat objects (notebook) in L hand. Baseline: 03/08/24: L hand 4th and 5th digit PIPs flex and require manual joint blocking or oval 8 splint to prevent flexion; 04/20/24: no significant change from status on 03/08/24  Goal status: in progress  ASSESSMENT: CLINICAL IMPRESSION:  Pt seen for 20th visit progress update.  Pt demonstrates improvement in L [facility] skills, noting improved accuracy when reaching toward a target with very mild dysmetria.  Pt has improved L [facility] skills, though pt does frequently drop small items d/t added challenge of sensory loss in the L hand.  Pt routinely challenges himself to engage the LUE into daily tasks as much as possible, and is fairly consistent to compensate with his vision when working to manipulate items in the L hand d/t decreased sensation.  Pt has demonstrated slight decline in L hand grip and pinch strength this period, though focus has been less on strength and more on coordination while developing and sustaining 2 and 3 point prehension patterns against very light resistance.  Lumbrical strength has been addressed in OT sessions this period, with reinforcement to continue to carryover exercises at home d/t residual weakness.  HEP reviewed today to increase targeted areas for hand strength, including gross grasping, lateral pinch, 3 point pinch, lumbrical strengthening, and digit adduction.  Pt in agreement with plan to hold OT for pt to work on HEP and recheck status/progress near end of cert.  Pt plans to return for OT reassessment on 05/26/24.   PERFORMANCE DEFICITS: in functional  skills including ADLs, IADLs, coordination, dexterity, sensation, and edema and psychosocial skills including environmental adaptation.   IMPAIRMENTS: are limiting patient from ADLs, IADLs, and leisure.   CO-MORBIDITIES: may have co-morbidities  that affects occupational performance. Patient will benefit from skilled OT to address above impairments and improve overall function.  MODIFICATION OR ASSISTANCE TO COMPLETE EVALUATION: No modification of tasks or assist necessary to complete an evaluation.  OT OCCUPATIONAL PROFILE AND HISTORY: Problem focused assessment: Including review of records relating to presenting problem.  CLINICAL DECISION MAKING: LOW - limited treatment options, no task modification necessary  REHAB POTENTIAL: Good  EVALUATION COMPLEXITY: Moderate    PLAN:  OT FREQUENCY: 1-2x/week  OT DURATION: 12 weeks  PLANNED INTERVENTIONS: 97168 OT Re-evaluation, 97535 self care/ADL training, 02889 therapeutic exercise, 97530 therapeutic activity, and 97112 neuromuscular re-education  RECOMMENDED OTHER SERVICES: PT  CONSULTED AND AGREED WITH PLAN OF CARE: Patient and family member/caregiver  PLAN FOR NEXT SESSION: see above  Inocente Blazing, MS, OTR/L  04/21/2024, 5:27 PM

## 2024-04-21 NOTE — Progress Notes (Unsigned)
 Electrophysiology Clinic Note    Date:  04/22/2024  Patient ID:  Thomas [name], Thomas [name] 1932-11-08, MRN [id number] PCP:  Darron Deatrice LABOR, MD  Cardiologist:  Deatrice Darron, MD  Cardiology APP:  [name], NP  Electrophysiologist:  [name], MD  Electrophysiology APP:  Denora Wysocki, NP     Discussed the use of AI scribe software for clinical note transcription with the patient, who gave verbal consent to proceed.   Patient Profile    Chief Complaint: AFib follow-up  History of Present Illness: Thomas [name] is a 89 y.o. male with PMH notable for CHB s/p PPM > CRT-P, parox AFib, HFrEF, CAD s/p PCI, HLD, HTN, CKD - 3 s/p partial nephrectomy d/t RCC, CVA; seen today for [name], MD (formerly Dr. Fernande) for routine electrophysiology followup.   He last saw Dr. Fernande 05/2023, in interim has seen by gen cards many times, most recently 01/2024 by Dr. Darron. Recent LHC with progression of LAD stenosis s/p PCI. Post-operative course complicated by L-sided weakness, ?stroke vs hypoperfusion related to sedation.  On plavix  + eliquis .   On follow-up today, he continues to take tikosyn  q12h without missing doses. He is not aware of his aflutter today. He has noticed slight increased edema to LLL, takes lasix  infrequently. He denies chest pain, chest pressure, palpitations. He does admit to slightly less energy than he usually has, thinks it's because of the increased edema. He continues to walk the dog regularly without activity limitations.   He continues to take eliquis  BID along with plavix . Took his last plavix  this morning.  He has intermittent bruising, but no significant bleeding.     Arrhythmia/Device History MDT dual chamber PPM, imp 2018; CHB CRT-P upgrade 01/2023   AAD -  Tikosyn     ROS:  Please see the history of present illness. All other systems are reviewed and otherwise negative.    Physical Exam    VS:  BP (!) 130/58 (BP Location: Left  Arm, Patient Position: Sitting, Cuff Size: Normal)   Pulse 65   Ht 5' 5 (1.651 m)   Wt 158 lb 12.8 oz (72 kg)   SpO2 98%   BMI 26.43 kg/m  BMI: Body mass index is 26.43 kg/m.           Wt Readings from Last 3 Encounters:  04/22/24 158 lb 12.8 oz (72 kg)  02/03/24 157 lb (71.2 kg)  12/18/23 157 lb 3.2 oz (71.3 kg)     GEN- The patient is well appearing, alert and oriented x 3 today.   Lungs- Clear to ausculation bilaterally, normal work of breathing.  Heart- Regular rate and rhythm, no murmurs, rubs or gallops Extremities- 1-2+ LLL peripheral edema, trace RLE, warm, dry Skin-  device pocket well-healed, no tethering   Device interrogation done today and reviewed by myself:  Battery 8.2 years Lead thresholds, impedence, sensing stable  VP 99% High AF burden, ?persistent Dependent down to VVI 40 Presents in Aflutter today Elevated OptiVol No changes made today   Studies Reviewed   Previous EP, cardiology notes.    EKG is ordered. Personal review of EKG from today shows:    EKG Interpretation Date/Time:  Thursday April 22 2024 11:24:50 EST Ventricular Rate:  65 PR Interval:    QRS Duration:  158 QT Interval:  438 QTC Calculation: 455 R Axis:   -29  Text Interpretation: Ventricular-paced rhythm Confirmed by Shaunika Italiano [phone]) on 04/22/2024 11:36:42 AM  02/03/2024 EKG - Aflutter at 65; QTC 436 12/18/2023 EKG - AV paced at 71; QTC 454 11/22/2023 EKG - AV paced at 64; QTC 441  LHC, 11/10/2023   Mid LAD lesion is 60% stenosed.   Prox Cx to Mid Cx lesion is 65% stenosed.   Dist RCA lesion is 40% stenosed with 40% stenosed side branch in RPAV.   Prox RCA lesion is 30% stenosed.   Mid RCA lesion is 70% stenosed.   Ost 2nd Mrg to 2nd Mrg lesion is 40% stenosed.   Ost LAD to Prox LAD lesion is 90% stenosed.   A drug-eluting stent was successfully placed using a STENT ONYX FRONTIER 3.5X15.   Post intervention, there is a 0% residual stenosis.  TTE,  10/29/2023  1. Left ventricular ejection fraction, by estimation, is 55 to 60%. The left ventricle has normal function. The left ventricle has no regional wall motion abnormalities. Left ventricular diastolic parameters are consistent with Grade I diastolic dysfunction (impaired relaxation).   2. Right ventricular systolic function is normal. The right ventricular size is normal.   3. The mitral valve is normal in structure. Trivial mitral valve regurgitation. No evidence of mitral stenosis.   4. The aortic valve is calcified. Aortic valve regurgitation is mild to moderate. No aortic stenosis is present. Aortic valve mean gradient measures 7.0 mmHg. Aortic valve Vmax measures 1.92 m/s.   5. Aortic dilatation noted. There is dilatation of the ascending aorta, measuring 42 mm.   6. The inferior vena cava is normal in size with greater than 50% respiratory variability, suggesting right atrial pressure of 3 mmHg.   TTE, 07/15/2023 LVEF 40-45% LA mildly dilated  R/LHC, 11/25/2022   Mid LAD lesion is 60% stenosed.   Dist RCA lesion is 40% stenosed with 40% stenosed side branch in RPAV.   Prox RCA lesion is 30% stenosed.   Mid RCA lesion is 70% stenosed.   Ost 2nd Mrg to 2nd Mrg lesion is 40% stenosed.   Ost LAD to Prox LAD lesion is 40% stenosed.   Prox Cx to Mid Cx lesion is 65% stenosed.  TTE, 10/21/2022 LVEF 30-35%   Assessment and Plan     #) CHB s/p CRT-P Device functioning well, see paceart for details Battery 8.2 years Lead measurements stable Increased AF burden, see below  #) parox AFib #) tikosyn  monitoring Significant increase in AF burden over past few months He continues to take tikosyn  q12h. We discussed transitioning from tikosyn  > amiodarone  and patient is agreeable Discussed with pharmacy, will stop tikosyn  today, and start amiodarone  on 12/24 When starting amiodarone  will start 200mg  BID x 2 weeks, then reduce to 200mg  daily Update LFT, thyroid  labs today  #)  Hypercoag d/t parox afib CHA2DS2-VASc Score = at least 7 [CHF History: 1, HTN History: 1, Diabetes History: 0, Stroke History: 2, Vascular Disease History: 1, Age Score: 2, Gender Score: 0].  Therefore, the patient's annual risk of stroke is 11.2 %.    Stroke ppx - 2.5mg  eliquis  BID, appropriately dosed No bleeding concerns  #) CAD s/p PCI Continue plavix  Due for general cardiology    Current medicines are reviewed at length with the patient today.   The patient has concerns regarding his medicines.  The following changes were made today:   STOP tikosyn  today START amiodarone  on 12/24  Labs/ tests ordered today include:  Orders Placed This Encounter  Procedures   Comprehensive metabolic panel with GFR   Magnesium    T4, free  TSH   CBC   EKG 12-Lead     Disposition: Follow up with Dr. Kennyth or EP APP in 6 weeks   Follow-up with general cardiology in about 1-2 months   Signed, Cozy Veale, NP  04/22/2024  1:42 PM  Electrophysiology [facility]

## 2024-04-22 ENCOUNTER — Ambulatory Visit: Attending: Cardiology | Admitting: Cardiology

## 2024-04-22 ENCOUNTER — Encounter: Payer: Self-pay | Admitting: Cardiology

## 2024-04-22 VITALS — BP 130/58 | HR 65 | Ht 65.0 in | Wt 158.8 lb

## 2024-04-22 DIAGNOSIS — I4819 Other persistent atrial fibrillation: Secondary | ICD-10-CM

## 2024-04-22 DIAGNOSIS — I5020 Unspecified systolic (congestive) heart failure: Secondary | ICD-10-CM

## 2024-04-22 DIAGNOSIS — I442 Atrioventricular block, complete: Secondary | ICD-10-CM

## 2024-04-22 DIAGNOSIS — I5022 Chronic systolic (congestive) heart failure: Secondary | ICD-10-CM

## 2024-04-22 DIAGNOSIS — Z79899 Other long term (current) drug therapy: Secondary | ICD-10-CM | POA: Diagnosis not present

## 2024-04-22 DIAGNOSIS — Z950 Presence of cardiac pacemaker: Secondary | ICD-10-CM | POA: Diagnosis not present

## 2024-04-22 LAB — CUP PACEART INCLINIC DEVICE CHECK
Date Time Interrogation Session: 20251218171251
Implantable Lead Connection Status: 753985
Implantable Lead Connection Status: 753985
Implantable Lead Connection Status: 753985
Implantable Lead Implant Date: 20180214
Implantable Lead Implant Date: 20180214
Implantable Lead Implant Date: 20240906
Implantable Lead Location: 753859
Implantable Lead Location: 753860
Implantable Lead Location: 753860
Implantable Lead Model: 3830
Implantable Lead Model: 5076
Implantable Lead Model: 5076
Implantable Pulse Generator Implant Date: 20240906

## 2024-04-22 MED ORDER — AMIODARONE HCL 200 MG PO TABS: ORAL_TABLET | ORAL | 3 refills | Status: AC

## 2024-04-22 MED ORDER — CLOPIDOGREL BISULFATE 75 MG PO TABS: 75.0000 mg | ORAL_TABLET | Freq: Every day | ORAL | 3 refills | Status: DC

## 2024-04-22 NOTE — Patient Instructions (Signed)
 Medication Instructions:  Your physician recommends that you continue on your current medications as directed. Please refer to the Current Medication list given to you today.  *If you need a refill on your cardiac medications before your next appointment, please call your pharmacy*  Lab Work: Your provider would like for you to have following labs drawn today CMP, Magnesium , TSH, T4 and CBC.SABRA     Testing/Procedures: No test ordered today   Follow-Up: At [facility], you and your health needs are our priority.  As part of our continuing mission to provide you with exceptional heart care, our providers are all part of one team.  This team includes your primary Cardiologist (physician) and Advanced Practice Providers or APPs (Physician Assistants and Nurse Practitioners) who all work together to provide you with the care you need, when you need it.  Your next appointment:   6 week(s)  Provider:   Dr. [name]

## 2024-04-23 ENCOUNTER — Other Ambulatory Visit: Payer: Self-pay | Admitting: Urology

## 2024-04-23 DIAGNOSIS — E291 Testicular hypofunction: Secondary | ICD-10-CM

## 2024-04-23 LAB — COMPREHENSIVE METABOLIC PANEL WITH GFR
ALT: 12 IU/L (ref 0–44)
AST: 22 IU/L (ref 0–40)
Albumin: 3.9 g/dL (ref 3.7–4.7)
Alkaline Phosphatase: 98 IU/L (ref 48–129)
BUN/Creatinine Ratio: 12 (ref 10–24)
BUN: 23 mg/dL (ref 8–27)
Bilirubin Total: 0.4 mg/dL (ref 0.0–1.2)
CO2: 26 mmol/L (ref 20–29)
Calcium: 9.1 mg/dL (ref 8.6–10.2)
Chloride: 100 mmol/L (ref 96–106)
Creatinine, Ser: 1.9 mg/dL — ABNORMAL HIGH (ref 0.76–1.27)
Globulin, Total: 2.2 g/dL (ref 1.5–4.5)
Glucose: 79 mg/dL (ref 70–99)
Potassium: 4.8 mmol/L (ref 3.5–5.2)
Sodium: 138 mmol/L (ref 134–144)
Total Protein: 6.1 g/dL (ref 6.0–8.5)
eGFR: 33 mL/min/1.73 — ABNORMAL LOW

## 2024-04-23 LAB — CBC
Hematocrit: 44.6 % (ref 37.5–51.0)
Hemoglobin: 14 g/dL (ref 13.0–17.7)
MCH: 28.6 pg (ref 26.6–33.0)
MCHC: 31.4 g/dL — ABNORMAL LOW (ref 31.5–35.7)
MCV: 91 fL (ref 79–97)
Platelets: 206 x10E3/uL (ref 150–450)
RBC: 4.9 x10E6/uL (ref 4.14–5.80)
RDW: 13.9 % (ref 11.6–15.4)
WBC: 7.2 x10E3/uL (ref 3.4–10.8)

## 2024-04-23 LAB — T4, FREE: Free T4: 0.83 ng/dL (ref 0.82–1.77)

## 2024-04-23 LAB — MAGNESIUM: Magnesium: 2.2 mg/dL (ref 1.6–2.3)

## 2024-04-23 LAB — TSH: TSH: 2.17 u[IU]/mL (ref 0.450–4.500)

## 2024-04-26 ENCOUNTER — Ambulatory Visit

## 2024-04-28 ENCOUNTER — Ambulatory Visit

## 2024-05-03 ENCOUNTER — Ambulatory Visit

## 2024-05-04 ENCOUNTER — Ambulatory Visit

## 2024-05-04 DIAGNOSIS — I442 Atrioventricular block, complete: Secondary | ICD-10-CM

## 2024-05-05 ENCOUNTER — Ambulatory Visit

## 2024-05-05 LAB — CUP PACEART REMOTE DEVICE CHECK
Battery Remaining Longevity: 94 mo
Battery Voltage: 2.97 V
Brady Statistic AP VP Percent: 64.06 %
Brady Statistic AP VS Percent: 0.01 %
Brady Statistic AS VP Percent: 37.24 %
Brady Statistic AS VS Percent: 0.01 %
Brady Statistic RA Percent Paced: 18.66 %
Brady Statistic RV Percent Paced: 99.88 %
Date Time Interrogation Session: 20251230011236
Implantable Lead Connection Status: 753985
Implantable Lead Connection Status: 753985
Implantable Lead Connection Status: 753985
Implantable Lead Implant Date: 20180214
Implantable Lead Implant Date: 20180214
Implantable Lead Implant Date: 20240906
Implantable Lead Location: 753859
Implantable Lead Location: 753860
Implantable Lead Location: 753860
Implantable Lead Model: 3830
Implantable Lead Model: 5076
Implantable Lead Model: 5076
Implantable Pulse Generator Implant Date: 20240906
Lead Channel Impedance Value: 247 Ohm
Lead Channel Impedance Value: 266 Ohm
Lead Channel Impedance Value: 323 Ohm
Lead Channel Impedance Value: 342 Ohm
Lead Channel Impedance Value: 342 Ohm
Lead Channel Impedance Value: 361 Ohm
Lead Channel Impedance Value: 380 Ohm
Lead Channel Impedance Value: 456 Ohm
Lead Channel Impedance Value: 494 Ohm
Lead Channel Pacing Threshold Amplitude: 0.5 V
Lead Channel Pacing Threshold Amplitude: 1 V
Lead Channel Pacing Threshold Amplitude: 1.25 V
Lead Channel Pacing Threshold Pulse Width: 0.4 ms
Lead Channel Pacing Threshold Pulse Width: 0.4 ms
Lead Channel Pacing Threshold Pulse Width: 0.4 ms
Lead Channel Sensing Intrinsic Amplitude: 15.5 mV
Lead Channel Sensing Intrinsic Amplitude: 15.5 mV
Lead Channel Sensing Intrinsic Amplitude: 2.625 mV
Lead Channel Sensing Intrinsic Amplitude: 2.625 mV
Lead Channel Setting Pacing Amplitude: 1.5 V
Lead Channel Setting Pacing Amplitude: 1.5 V
Lead Channel Setting Pacing Amplitude: 2.5 V
Lead Channel Setting Pacing Pulse Width: 0.4 ms
Lead Channel Setting Pacing Pulse Width: 0.4 ms
Lead Channel Setting Sensing Sensitivity: 4 mV
Zone Setting Status: 755011

## 2024-05-09 ENCOUNTER — Ambulatory Visit: Payer: Self-pay | Admitting: Cardiology

## 2024-05-10 ENCOUNTER — Ambulatory Visit

## 2024-05-10 DIAGNOSIS — E291 Testicular hypofunction: Secondary | ICD-10-CM

## 2024-05-10 MED ORDER — TESTOSTERONE 1.62 % TD GEL: 2.0000 | Freq: Every day | TRANSDERMAL | 3 refills | Status: AC

## 2024-05-11 DIAGNOSIS — N401 Enlarged prostate with lower urinary tract symptoms: Secondary | ICD-10-CM

## 2024-05-11 DIAGNOSIS — E291 Testicular hypofunction: Secondary | ICD-10-CM

## 2024-05-12 ENCOUNTER — Ambulatory Visit

## 2024-05-12 ENCOUNTER — Ambulatory Visit: Attending: Cardiovascular Disease

## 2024-05-12 ENCOUNTER — Other Ambulatory Visit

## 2024-05-12 DIAGNOSIS — M6281 Muscle weakness (generalized): Secondary | ICD-10-CM | POA: Insufficient documentation

## 2024-05-12 DIAGNOSIS — I10 Essential (primary) hypertension: Secondary | ICD-10-CM

## 2024-05-12 DIAGNOSIS — N401 Enlarged prostate with lower urinary tract symptoms: Secondary | ICD-10-CM

## 2024-05-12 DIAGNOSIS — R278 Other lack of coordination: Secondary | ICD-10-CM | POA: Insufficient documentation

## 2024-05-12 DIAGNOSIS — E291 Testicular hypofunction: Secondary | ICD-10-CM

## 2024-05-12 NOTE — Therapy (Signed)
 " OUTPATIENT OCCUPATIONAL THERAPY NEURO TREATMENT NOTE   Patient Name: Thomas [name] MRN: [id number] DOB:10-Dec-1932, 89 y.o., male Today's Date: 05/16/2024  PCP: Charlie Forte REFERRING PROVIDER: [name]  END OF SESSION:  OT End of Session - 05/16/24 1312     Visit Number 21    Number of Visits 34    Date for Recertification  05/31/24    Authorization Time Period Reporting period beginning 03/08/24    OT Start Time 1145    OT Stop Time 1230    OT Time Calculation (min) 45 min    Activity Tolerance Patient tolerated treatment well    Behavior During Therapy [facility] for tasks assessed/performed         Past Medical History:  Diagnosis Date   Aortic insufficiency    a. 07/2023 Echo: Mod AI; b. 09/2023 Echo: Mild-mod AI.   Aortic valve disorders    Arthritis    Basal cell carcinoma    face, nose left shoulder, left arm (06/19/2016)   Basal cell carcinoma 09/13/2020   right temple   BBB (bundle branch block)    hx right   CAD (coronary artery disease)    a. 10/2017 Cath: mod, nonobs dzs; b. 02/2021 Cath: mod, nonobs dzs; c. 11/2022 Cath: LAD 30ost/p, 23m, D1 min irregs, LCX 65p/m, OM1 min irregs, OM2 40, OM3 nl, RCA 30p, 6m, 40d w/ 40 in side branch-->Med rx; d. 11/2023 PCI: LAD 90ost/p (3.5x15 Onyx Frontier DES). Otw stable anatomy.   Chronic back pain    neck, thoracic, lower back (06/19/2016)   CKD (chronic kidney disease), stage III (HCC)    Complete heart block (HCC) 06/2016   a. 06/2016 s/p MDT PPM; b. 01/2023 s/p upgrade to MDT CRT-P (ser # [id number]).   GERD (gastroesophageal reflux disease)    Gout    Heart block    I've had type I, II Wenke before now (06/19/2016)   History of gout    History of hiatal hernia    self dx'd (06/19/2016)   Hyperlipidemia    Hypertension    Lyme disease    dx'd by me 2003; cx's showed dx 08/2015   Migraine    3-4/year (06/19/2016)   Mitral regurgitation    a. 07/2023 Echo: Mod MR.   NICM (nonischemic cardiomyopathy - in  setting of RV pacing) (HCC)    a. 02/2019 Echo: EF 45-50%; b. 10/2022 Echo: EF 30-35%; c. 01/2023 CRT-P upgrade; e. 07/2023 Echo: EF 40-45%, glob HK; f. 10/2023 Echo: EF of 55 to 60% with grade 1 DD, nl RV fxn, triv MR, mild to mod AI, and mildly dilated ascending aorta at 42 mm.   PAF (paroxysmal atrial fibrillation) (HCC)    a. CHA2DS2VASc = 7--> eliquis ; b. On tikosyn .   Paroxysmal atrial flutter (HCC)    Presence of permanent cardiac pacemaker    PVC's (premature ventricular contractions)    Renal cancer, left (HCC) 2006   S/P cryotherapy   Spinal stenosis    cervical, 1 thoracic, lumbar (06/19/2016)   Squamous carcinoma    face, nose left shoulder, left arm (06/19/2016)   Stroke (HCC)    TIA (transient ischemic attack) 06/14/2016   I'm not sure that's what it was (06/25/2016)   Visit for monitoring Tikosyn  therapy 09/09/2017   Past Surgical History:  Procedure Laterality Date   ANKLE FRACTURE SURGERY Right 1967   BACK SURGERY  05/07/2020   BASAL CELL CARCINOMA EXCISION     face, nose left shoulder, left  arm   BIOPSY PROSTATE  2001 & 2003   BIV UPGRADE N/A 01/10/2023   Procedure: BIV UPGRADE;  Surgeon: Fernande Elspeth BROCKS, MD;  Location: [facility] INVASIVE CV LAB;  Service: Cardiovascular;  Laterality: N/A;   CARDIAC CATHETERIZATION  1990's   CARDIOVERSION N/A 09/11/2017   Procedure: CARDIOVERSION;  Surgeon: Pietro Redell RAMAN, MD;  Location: [facility] ENDOSCOPY;  Service: Cardiovascular;  Laterality: N/A;   CORONARY STENT INTERVENTION N/A 11/10/2023   Procedure: CORONARY STENT INTERVENTION;  Surgeon: Darron Deatrice LABOR, MD;  Location: [facility] INVASIVE CV LAB;  Service: Cardiovascular;  Laterality: N/A;   FRACTURE SURGERY     HOLEP-LASER ENUCLEATION OF THE PROSTATE WITH MORCELLATION N/A 07/10/2020   Procedure: HOLEP-LASER ENUCLEATION OF THE PROSTATE WITH MORCELLATION;  Surgeon: Penne Knee, MD;  Location: [facility] ORS;  Service: Urology;  Laterality: N/A;   INGUINAL HERNIA REPAIR Left 2012   INSERT /  REPLACE / REMOVE PACEMAKER  06/19/2016   LAPAROSCOPIC ABLATION RENAL MASS     LEFT HEART CATH AND CORONARY ANGIOGRAPHY Left 10/23/2017   Procedure: LEFT HEART CATH AND CORONARY ANGIOGRAPHY;  Surgeon: Darron Deatrice LABOR, MD;  Location: [facility] INVASIVE CV LAB;  Service: Cardiovascular;  Laterality: Left;   LEFT HEART CATH AND CORONARY ANGIOGRAPHY Left 02/26/2021   Procedure: LEFT HEART CATH AND CORONARY ANGIOGRAPHY;  Surgeon: Darron Deatrice LABOR, MD;  Location: [facility] INVASIVE CV LAB;  Service: Cardiovascular;  Laterality: Left;   LEFT HEART CATH AND CORONARY ANGIOGRAPHY Left 11/10/2023   Procedure: LEFT HEART CATH AND CORONARY ANGIOGRAPHY;  Surgeon: Darron Deatrice LABOR, MD;  Location: [facility] INVASIVE CV LAB;  Service: Cardiovascular;  Laterality: Left;   PACEMAKER IMPLANT N/A 06/19/2016   Procedure: Pacemaker Implant;  Surgeon: Elspeth BROCKS Fernande, MD;  Location: [facility] INVASIVE CV LAB;  Service: Cardiovascular;  Laterality: N/A;   pacemasker     PROSTATE SURGERY     RIGHT/LEFT HEART CATH AND CORONARY ANGIOGRAPHY Bilateral 11/25/2022   Procedure: RIGHT/LEFT HEART CATH AND CORONARY ANGIOGRAPHY;  Surgeon: Darron Deatrice LABOR, MD;  Location: [facility] INVASIVE CV LAB;  Service: Cardiovascular;  Laterality: Bilateral;   SQUAMOUS CELL CARCINOMA EXCISION     face, nose left shoulder, left arm   TONSILLECTOMY AND ADENOIDECTOMY     Patient Active Problem List   Diagnosis Date Noted   Angina pectoris 11/10/2023   Weakness of left foot 10/29/2023   CKD stage 3b, GFR 30-44 ml/min (HCC) 10/29/2023   Obstructive sleep apnea 10/29/2023   Combined systolic and diastolic congestive heart failure (HCC) 10/29/2023   AKI (acute kidney injury) 04/29/2023   UTI (urinary tract infection) 04/29/2023   Chronic systolic heart failure (HCC) 11/25/2022   Sick sinus syndrome (HCC) 02/26/2022   Closed fracture of second lumbar vertebra (HCC) 09/26/2021   Dysphagia, unspecified 09/26/2021   Heart block 09/26/2021   Heartburn 09/26/2021    History of lumbar fusion 09/26/2021   Inflammation of sacroiliac joint 09/26/2021   Other symptoms and signs involving cognitive functions and awareness 09/26/2021   Prediabetes 09/26/2021   Low back pain 09/26/2021   Renal cell carcinoma (HCC) 09/26/2021   Essential hypertension 09/26/2021   Cerebrovascular disease 09/26/2021   Transient cerebral ischemic attack, unspecified 09/26/2021   Cerebrovascular disease, unspecified 09/26/2021   Transient ischemic attack 09/26/2021   Unstable angina (HCC)    Lumbar burst fracture (HCC) 05/04/2020   Hypogonadism male 10/10/2019   Persistent atrial fibrillation (HCC) 01/12/2019   Acute low back pain 11/04/2017   Abnormal screening cardiac CT    Visit for monitoring  Tikosyn  therapy 09/09/2017   History of TIA (transient ischemic attack) 08/29/2016   Paroxysmal atrial fibrillation (HCC) 07/23/2016   CAD (coronary artery disease) 07/23/2016   Snores 06/27/2016   S/P placement of cardiac pacemaker 06/25/2016   Hemiparesis (HCC) 06/25/2016   Acute ischemic stroke (HCC)    Left-sided weakness    Hemisensory loss    Dysarthria    Stroke-like symptoms    Complete heart block (HCC)    Pain in thoracic spine    Orthostasis    Lethargy    Vertebral artery occlusion, left    TIA (transient ischemic attack) 06/14/2016   Arthritis 02/06/2015   Esophageal reflux 02/06/2015   Arthritis urica 02/06/2015   Cannot sleep 02/06/2015   Arthritis, degenerative 02/06/2015   Adenocarcinoma, renal cell (HCC) 02/06/2015   Benign prostatic hyperplasia with lower urinary tract symptoms 11/21/2014   Personal history of other malignant neoplasm of kidney 11/21/2014   History of Lyme disease 05/06/2014   Palpitations 12/31/2013   Hyperlipidemia 11/02/2010   HYPERTENSION, BENIGN 04/16/2010   ONSET DATE: 11/10/23  REFERRING DIAG: CVA  THERAPY DIAG:  Muscle weakness (generalized)  Other lack of coordination  Rationale for Evaluation and Treatment:  Rehabilitation  SUBJECTIVE:  SUBJECTIVE STATEMENT: Pt reports buttoning is getting easier, and he was able to get a button hook and uses it for some occasional tricky buttons.  Pt accompanied by: self and significant other  PERTINENT HISTORY: 89 year old male who was admitted to hospital on 11/10/23 for management of unstable angina after pt developed clumsiness of the left arm after cardiac catheterization 7/7, workup for TIA. Per neurology note symptoms secondary to relative cerebral hypoperfusion in the setting of heart disease and right carotid artery and M1 stenosis. PMhx: PAF on Eliquis , HTN, HLD, prediabetes, OSA on CPAP, dominant left vertebral artery occlusion, TIA, SSS s/p PPM, BPH, renal CA, lumbar fusion, A-fib.  PRECAUTIONS: None  WEIGHT BEARING RESTRICTIONS: No  PAIN: 05/12/24: back pain 1-2/10 with activity   FALLS: Has patient fallen in last 6 months? Yes. Number of falls 4  LIVING ENVIRONMENT: Lives with: lives with their spouse Lives in: House/apartment Stairs: Yes: Internal: 2 steps; none Has following equipment at home: Single point cane and Walker - 2 wheeled  PLOF: Independent with basic ADLs  PATIENT GOALS: to go to [facility] for more detailed neuro testing and return to function in LUE  OBJECTIVE:  Note: Objective measures were completed at Evaluation unless otherwise noted.  HAND DOMINANCE: Right  ADLs: Overall ADLs: MIN A Transfers/ambulation related to ADLs: Eating: IND - uses dominant R Grooming: SETUP UB Dressing: SETUP - buttons assist LB Dressing: MIN - hiking pants  Toileting: IND Bathing: IND Tub Shower transfers: IND Equipment: none  IADLs: Shopping: spouse completes as needed Light housekeeping: spouse completes as needed Meal Prep: assist from spouse Community mobility: SPC/RW as needed Medication management: IND Landscape architect: IND Handwriting: 100% legible R hand  MOBILITY STATUS: Independent and Hx of falls  POSTURE  COMMENTS:  rounded shoulders and forward head Sitting balance: Sits without UE support up to 30 sec  ACTIVITY TOLERANCE: Activity tolerance: IND - tolerates 20 min standing activities   FUNCTIONAL OUTCOME MEASURES:    UPPER EXTREMITY ROM:   BUE AROM WFL   UPPER EXTREMITY MMT:     MMT Right eval Left eval Right 04/21/24 Left 04/21/24  Shoulder flexion   5 5  Shoulder abduction   5 5  Shoulder adduction      Shoulder extension  Shoulder internal rotation   5 5  Shoulder external rotation   5   Middle trapezius      Lower trapezius      Elbow flexion   5 5  Elbow extension   5 5  Wrist flexion   5 5  Wrist extension   5 5  Wrist ulnar deviation      Wrist radial deviation      Wrist pronation      Wrist supination      (Blank rows = not tested)  HAND FUNCTION: Grip strength: Right: 76 lbs; Left: 55 lbs, Lateral pinch: Right: 15 lbs, Left: 11 lbs, and 3 point pinch: Right: 21 lbs, Left: 14 lbs 03/08/24: Grip strength: Right: 85 lbs, Left: 63 lbs; Lateral pinch: Right: 24 lbs, Left: 13 lbs, and 3 point pinch: Right: 25 lbs, Left: 16 lbs  04/21/24: Grip strength: Left: 56 lbs; Lateral pinch: Left: 8 lbs, 3 point pinch: 4 lbs (fingers slipping, measured on Saehan device with fingers staying in place better on rubber bulb; Arnold not available d/t out for recalibration)  05/12/24: Right: 86 lbs; Left: 56 lbs, Lateral pinch: Right: 19 lbs, Left: 9 lbs, 3 point pinch: Right: 20 lbs, Left: 11 lbs    COORDINATION: 9 Hole Peg test: Right: 26 sec; Left: 10 sec 03/08/24: Left: 58 sec.  04/21/24: Left: Trial 1:(warm up): 1 min 1 sec, Trial 2: 43 sec., Trial 3: 54 sec  05/12/24: Left: 52 sec, 1 min 3 sec, 1 min 5 sec   SENSATION:  12/17/23 Light touch: decreased sensation and numbness in L hand 5th digit Stereognosis:   Hot/Cold:   Proprioception: intact  EDEMA: mild swelling to L hand  COGNITION: Overall cognitive status: Within functional limits for tasks  assessed  VISION: Subjective report: baseline Baseline vision: No visual deficits  VISION ASSESSMENT: WFL                                                                                                 TREATMENT DATE: 05/12/24  Moist heat applied to T-spine for pain management throughout session  Therapeutic Activity: -Objective measures taken and goals updated for progress note.  Self Care: -Reviewed progress towards goals and recommended HEP areas of focus.  Provided updated visual handouts for areas of focus for R hand strengthening with pt verbalizing and demonstrating understanding.   Therapeutic Exercise: -Reviewed/completed graded lumbrical strengthening exercises, including sustaining light grasp of table edge with lumbrical grasp pattern, using R hand to set up L 4th and 5th digit PIP into full extension, progressing to a light squeeze while focusing on maintaining extension of PIP joints.  Reviewed downgrading strategies as needed, including practice with holding light to heavier objects using lumbrical grasp pattern, and working towards maintaining grasp against a pull of those objects from hand.   -Reviewed/completed active/isolated single digit extension on table top, and digit abd/add with and without resistance (putty or rubber band).   -Visual handouts issued for above.   PATIENT EDUCATION: Education details: HEP review, progress towards goals Person educated: Patient Education method: Programmer, Multimedia, Facilities Manager, Verbal  cues, and Handouts Education comprehension: verbalized understanding and returned demonstration  HOME EXERCISE PROGRAM: Blue theraputty [facility] - coins at tabletop Intrinsic hand strengthening  GOALS: Goals reviewed with patient? Yes  SHORT TERM GOALS: Target date: 04/19/24  Pt will IND to complete HEP for functional strenghening of non-dominant LUE. Baseline: not initiated; 03/08/24: Min vc with recently added exercises (intrinsic hand strengthening);  04/20/24: Indep with visual handouts Goal status: achieved   LONG TERM GOALS: Target date: 05/31/24  Pt will increase L lateral pinch strength by 5 # of force to securely hold a wrench during leisure activities. Baseline: L pinch 11#; 03/08/24: L 13#; 04/20/24: L: 8# today on standard pinch gauge with fingers slipping (decline from 13#, but alternate pinch gauge used previous session); 05/12/24: L pinch: 9 lbs; pt has been using tools around the house, but requires increased time/effort Goal status: not achieved/d/c   2.  Pt will increase L grip strength by 15# to wash dishes without dropping items.  Baseline: L grip 55#; 03/08/24: L grip 63# (pt reports having to keep eyes on dishes to avoid dropping any d/t sensory deficits); 04/20/24: L 56 lbs (slight decline); 05/12/24: L grip 56 lbs (pt continues to use caution and vision for compensation to reduce dropping) Goal status: not achieved/d/c  3.  Pt will improve LUE coordination by 30 seconds to independently manipulate fasteners during dressing tasks. Baseline: L 1'10; 03/08/24: L 58 sec; 04/20/24: L 43 sec (best of 3 trials; not consistent); 05/12/24: L 53 sec (best of 3 trials, not consistent) Goal status: not achieved/d/c  4.  Pt will improve dynamic seated balance to don B shoes independently on edge of bed without loss of balance. Baseline: requires chair with armrest to don shoes with standby assist; 03/08/24: Consistently sits in chair with arm rests (without SBA) for increased stability Goal status: d/c  5.  Pt will increase L hand lumbrical strength in order to securely hold and stabilize flat objects (notebook) in L hand. Baseline: 03/08/24: L hand 4th and 5th digit PIPs flex and require manual joint blocking or oval 8 splint to prevent flexion; 04/20/24: no significant change from status on 03/08/24; 05/12/24: Pt is able to sustain a lumbrical grasp with light items (a note pad) without use of oval 8 splints, but with minimal PIP flexion in L  5th digit.  L 4th digit can mostly maintain PIP extension with light items.   Goal status: improved/d/c  ASSESSMENT: CLINICAL IMPRESSION:  Pt seen this date for OT d/c after ~3 week hold for pt to focus on maintenance of L hand function by consistent carryover of HEP for L hand strength/coordination, as well as routine engagement of the L hand into daily tasks.  Throughout course of care, L hand grip and pinch measures have remained with no significant improvements or regressions.  L hand FMC has improved slightly, but remains impaired and mostly impacted by residual sensory impairment in the L hand.  Pt reports improvements in buttoning his shirts, as long as he buttons in front of a mirror, and has also obtained a button hook, which has helped on trickier buttons.  Pt has learned to compensate with vision when using L hand to stabilize and carry ADL supplies to reduce dropping.  Pt does demonstrate improvement in LUE ataxia, noting increased accuracy when reaching to desired targets with an outstretched arm.  Pt is indep with HEP for L hand strengthening and coordination, and reports that he has returned to doing some jobs  around the house, but acknowledges limitations with efficient use of tools.  Pt verbalizes and demonstrates independence with coordination activities to allow him to play his fiddle.  Pt reports he is slowly improving with his attempts at playing a few songs on his fiddle, currently choosing easier songs and playing at a slower pace.  No further skilled OT indicated at this time.  Pt in agreement with OT d/c with HEP.    PERFORMANCE DEFICITS: in functional skills including ADLs, IADLs, coordination, dexterity, sensation, and edema and psychosocial skills including environmental adaptation.   IMPAIRMENTS: are limiting patient from ADLs, IADLs, and leisure.   CO-MORBIDITIES: Thomas have co-morbidities  that affects occupational performance. Patient will benefit from skilled OT to address  above impairments and improve overall function.  MODIFICATION OR ASSISTANCE TO COMPLETE EVALUATION: No modification of tasks or assist necessary to complete an evaluation.  OT OCCUPATIONAL PROFILE AND HISTORY: Problem focused assessment: Including review of records relating to presenting problem.  CLINICAL DECISION MAKING: LOW - limited treatment options, no task modification necessary  REHAB POTENTIAL: Good  EVALUATION COMPLEXITY: Moderate    PLAN:  OT FREQUENCY: 1-2x/week  OT DURATION: 12 weeks  PLANNED INTERVENTIONS: 97168 OT Re-evaluation, 97535 self care/ADL training, 02889 therapeutic exercise, 97530 therapeutic activity, and 97112 neuromuscular re-education  RECOMMENDED OTHER SERVICES: PT  CONSULTED AND AGREED WITH PLAN OF CARE: Patient and family member/caregiver  PLAN FOR NEXT SESSION: N/A; d/c this date  Inocente Blazing, MS, OTR/L  05/16/2024, 1:14 PM      "

## 2024-05-12 NOTE — Progress Notes (Signed)
 Remote PPM Transmission

## 2024-05-13 ENCOUNTER — Ambulatory Visit: Payer: Self-pay | Admitting: Urology

## 2024-05-13 ENCOUNTER — Other Ambulatory Visit: Payer: Self-pay

## 2024-05-13 DIAGNOSIS — N401 Enlarged prostate with lower urinary tract symptoms: Secondary | ICD-10-CM

## 2024-05-13 LAB — BASIC METABOLIC PANEL WITH GFR
BUN/Creatinine Ratio: 13 (ref 10–24)
BUN: 28 mg/dL — ABNORMAL HIGH (ref 8–27)
CO2: 25 mmol/L (ref 20–29)
Calcium: 9.6 mg/dL (ref 8.6–10.2)
Chloride: 100 mmol/L (ref 96–106)
Creatinine, Ser: 2.08 mg/dL — ABNORMAL HIGH (ref 0.76–1.27)
Glucose: 81 mg/dL (ref 70–99)
Potassium: 4.8 mmol/L (ref 3.5–5.2)
Sodium: 141 mmol/L (ref 134–144)
eGFR: 30 mL/min/1.73 — ABNORMAL LOW

## 2024-05-13 LAB — PSA: Prostate Specific Ag, Serum: 0.2 ng/mL (ref 0.0–4.0)

## 2024-05-13 LAB — HEMOGLOBIN AND HEMATOCRIT, BLOOD
Hematocrit: 46.9 % (ref 37.5–51.0)
Hemoglobin: 14.4 g/dL (ref 13.0–17.7)

## 2024-05-13 LAB — TESTOSTERONE: Testosterone: 72 ng/dL — ABNORMAL LOW (ref 264–916)

## 2024-05-13 NOTE — Addendum Note (Signed)
 Addended byBETHA CORIE PLATER on: 05/13/2024 05:15 PM   Modules accepted: Orders

## 2024-05-14 ENCOUNTER — Other Ambulatory Visit

## 2024-05-17 ENCOUNTER — Ambulatory Visit

## 2024-05-19 ENCOUNTER — Ambulatory Visit

## 2024-05-24 ENCOUNTER — Encounter

## 2024-05-24 ENCOUNTER — Ambulatory Visit

## 2024-05-26 ENCOUNTER — Encounter

## 2024-05-26 ENCOUNTER — Ambulatory Visit

## 2024-05-31 ENCOUNTER — Ambulatory Visit

## 2024-05-31 ENCOUNTER — Encounter

## 2024-06-01 ENCOUNTER — Ambulatory Visit: Admitting: Cardiology

## 2024-06-02 ENCOUNTER — Encounter

## 2024-06-02 ENCOUNTER — Ambulatory Visit

## 2024-06-04 ENCOUNTER — Ambulatory Visit: Attending: Nurse Practitioner | Admitting: Nurse Practitioner

## 2024-06-04 VITALS — BP 158/80 | HR 74 | Wt 158.2 lb

## 2024-06-04 DIAGNOSIS — I679 Cerebrovascular disease, unspecified: Secondary | ICD-10-CM

## 2024-06-04 DIAGNOSIS — I255 Ischemic cardiomyopathy: Secondary | ICD-10-CM

## 2024-06-04 DIAGNOSIS — I2510 Atherosclerotic heart disease of native coronary artery without angina pectoris: Secondary | ICD-10-CM | POA: Diagnosis not present

## 2024-06-04 DIAGNOSIS — I5020 Unspecified systolic (congestive) heart failure: Secondary | ICD-10-CM | POA: Diagnosis not present

## 2024-06-04 DIAGNOSIS — I4819 Other persistent atrial fibrillation: Secondary | ICD-10-CM | POA: Diagnosis not present

## 2024-06-04 DIAGNOSIS — I10 Essential (primary) hypertension: Secondary | ICD-10-CM

## 2024-06-04 DIAGNOSIS — E785 Hyperlipidemia, unspecified: Secondary | ICD-10-CM

## 2024-06-04 NOTE — Progress Notes (Signed)
 "    Office Visit    Patient Name: Thomas [name] Date of Encounter: 06/04/2024  Primary Care Provider:  [name], MD Primary Cardiologist:  [name], MD  Cardiology APP:  [name], NP  Electrophysiologist:  [name], MD  Electrophysiology APP:  Riddle, Suzann, NP   Chief Complaint    89 y.o. male with h/o CHB s/t PPM >> CRT-P, PAF, HFrEF, CAD s/p PCI, HLD, HTN, CKD-III s/p partial nephrectomy d/t renal cell carcinoma, CVA, OSA on BiPAP who presents today for 4 month follow up.  Past Medical History   Subjective   Past Medical History:  Diagnosis Date   Aortic insufficiency    a. 07/2023 Echo: Mod AI; b. 09/2023 Echo: Mild-mod AI.   Aortic valve disorders    Arthritis    Basal cell carcinoma    face, nose left shoulder, left arm (06/19/2016)   Basal cell carcinoma 09/13/2020   right temple   BBB (bundle branch block)    hx right   CAD (coronary artery disease)    a. 10/2017 Cath: mod, nonobs dzs; b. 02/2021 Cath: mod, nonobs dzs; c. 11/2022 Cath: LAD 30ost/p, 48m, D1 min irregs, LCX 65p/m, OM1 min irregs, OM2 40, OM3 nl, RCA 30p, 55m, 40d w/ 40 in side branch-->Med rx; d. 11/2023 PCI: LAD 90ost/p (3.5x15 Onyx Frontier DES). Otw stable anatomy.   Chronic back pain    neck, thoracic, lower back (06/19/2016)   CKD (chronic kidney disease), stage III (HCC)    Complete heart block (HCC) 06/2016   a. 06/2016 s/p MDT PPM; b. 01/2023 s/p upgrade to MDT CRT-P (ser # [id number]).   GERD (gastroesophageal reflux disease)    Gout    Heart block    I've had type I, II Wenke before now (06/19/2016)   History of gout    History of hiatal hernia    self dx'd (06/19/2016)   Hyperlipidemia    Hypertension    Lyme disease    dx'd by me 2003; cx's showed dx 08/2015   Migraine    3-4/year (06/19/2016)   Mitral regurgitation    a. 07/2023 Echo: Mod MR.   NICM (nonischemic cardiomyopathy - in setting of RV pacing) (HCC)    a. 02/2019 Echo: EF 45-50%;  b. 10/2022 Echo: EF 30-35%; c. 01/2023 CRT-P upgrade; e. 07/2023 Echo: EF 40-45%, glob HK; f. 10/2023 Echo: EF of 55 to 60% with grade 1 DD, nl RV fxn, triv MR, mild to mod AI, and mildly dilated ascending aorta at 42 mm.   PAF (paroxysmal atrial fibrillation) (HCC)    a. CHA2DS2VASc = 7--> eliquis ; b. On tikosyn .   Paroxysmal atrial flutter (HCC)    Presence of permanent cardiac pacemaker    PVC's (premature ventricular contractions)    Renal cancer, left (HCC) 2006   S/P cryotherapy   Spinal stenosis    cervical, 1 thoracic, lumbar (06/19/2016)   Squamous carcinoma    face, nose left shoulder, left arm (06/19/2016)   Stroke (HCC)    TIA (transient ischemic attack) 06/14/2016   I'm not sure that's what it was (06/25/2016)   Visit for monitoring Tikosyn  therapy 09/09/2017   Past Surgical History:  Procedure Laterality Date   ANKLE FRACTURE SURGERY Right 1967   BACK SURGERY  05/07/2020   BASAL CELL CARCINOMA EXCISION     face, nose left shoulder, left arm   BIOPSY PROSTATE  2001 & 2003   BIV UPGRADE N/A 01/10/2023   Procedure:  BIV UPGRADE;  Surgeon: Fernande Elspeth BROCKS, MD;  Location: [facility] INVASIVE CV LAB;  Service: Cardiovascular;  Laterality: N/A;   CARDIAC CATHETERIZATION  1990's   CARDIOVERSION N/A 09/11/2017   Procedure: CARDIOVERSION;  Surgeon: Pietro Redell RAMAN, MD;  Location: [facility] ENDOSCOPY;  Service: Cardiovascular;  Laterality: N/A;   CORONARY STENT INTERVENTION N/A 11/10/2023   Procedure: CORONARY STENT INTERVENTION;  Surgeon: Darron Deatrice LABOR, MD;  Location: [facility] INVASIVE CV LAB;  Service: Cardiovascular;  Laterality: N/A;   FRACTURE SURGERY     HOLEP-LASER ENUCLEATION OF THE PROSTATE WITH MORCELLATION N/A 07/10/2020   Procedure: HOLEP-LASER ENUCLEATION OF THE PROSTATE WITH MORCELLATION;  Surgeon: Penne Knee, MD;  Location: [facility] ORS;  Service: Urology;  Laterality: N/A;   INGUINAL HERNIA REPAIR Left 2012   INSERT / REPLACE / REMOVE PACEMAKER  06/19/2016   LAPAROSCOPIC ABLATION  RENAL MASS     LEFT HEART CATH AND CORONARY ANGIOGRAPHY Left 10/23/2017   Procedure: LEFT HEART CATH AND CORONARY ANGIOGRAPHY;  Surgeon: Darron Deatrice LABOR, MD;  Location: [facility] INVASIVE CV LAB;  Service: Cardiovascular;  Laterality: Left;   LEFT HEART CATH AND CORONARY ANGIOGRAPHY Left 02/26/2021   Procedure: LEFT HEART CATH AND CORONARY ANGIOGRAPHY;  Surgeon: Darron Deatrice LABOR, MD;  Location: [facility] INVASIVE CV LAB;  Service: Cardiovascular;  Laterality: Left;   LEFT HEART CATH AND CORONARY ANGIOGRAPHY Left 11/10/2023   Procedure: LEFT HEART CATH AND CORONARY ANGIOGRAPHY;  Surgeon: Darron Deatrice LABOR, MD;  Location: [facility] INVASIVE CV LAB;  Service: Cardiovascular;  Laterality: Left;   PACEMAKER IMPLANT N/A 06/19/2016   Procedure: Pacemaker Implant;  Surgeon: Elspeth BROCKS Fernande, MD;  Location: [facility] INVASIVE CV LAB;  Service: Cardiovascular;  Laterality: N/A;   pacemasker     PROSTATE SURGERY     RIGHT/LEFT HEART CATH AND CORONARY ANGIOGRAPHY Bilateral 11/25/2022   Procedure: RIGHT/LEFT HEART CATH AND CORONARY ANGIOGRAPHY;  Surgeon: Darron Deatrice LABOR, MD;  Location: [facility] INVASIVE CV LAB;  Service: Cardiovascular;  Laterality: Bilateral;   SQUAMOUS CELL CARCINOMA EXCISION     face, nose left shoulder, left arm   TONSILLECTOMY AND ADENOIDECTOMY      Allergies  Allergies[1]     History of Present Illness      89 y.o. y/o male with the above past medical history including nonobstructive CAD, complete heart block, nonischemic cardiomyopathy, chronic heart failure with midrange ejection fraction, moderate MR/AI, hypertension, hyperlipidemia, paroxysmal atrial fibrillation and flutter, TIA/stroke, and CKD 3 status post partial nephrectomy in the setting of left renal cell carcinoma.  In the setting of admission for stroke, he was noted to have atrial fibrillation and complete heart block.  He underwent permanent pacemaker placement in February 2018.  He was also diagnosed with atrial fibrillation and subsequently  underwent Tikosyn  loading.  He has been chronically coagulated with Eliquis .  Coronary CT angiogram in May 2019 showed a calcium  score of 1366 with possible significant LAD disease.  He underwent diagnostic catheterization 10/2017 which showed moderate, nonobstructive three-vessel disease and medical therapy was recommended.  In October 2022, LHC performed for exertional chest pain and dyspnea, with findings of stable, calcified moderate 3v CAD with aggressive medical therapy recommended.  In June 2024, he was noted to have a drop in EF to 30-35% with global hypokinesis.  This was down from 45-50% in 2021.  He also complained of exertional chest discomfort.  He underwent diagnostic catheterization in July 2024 which showed stable three-vessel CAD with moderately calcified coronary arteries.  Right heart cath showed normal filling pressures  and normal cardiac output.  Medical therapy was recommended and FFR of the RCA and circumflex with possible PCI could be considered in the future for refractory angina.  In the interim, he was referred back to electrophysiology in the setting of persistent RV pacing and new cardiomyopathy.  He underwent CRT-P in September 2024.  Follow-up echocardiogram in March 2025 showed some improvement in EF to 40-45% with RVSP 42.2 mmHg, moderate MR and moderate AI.  In May 2025, he was seen in clinic with complaints of progressive chest burning and left arm pain.  We initially added isosorbide  mononitrate therapy, which did not significantly change symptoms.  In late June 2025, he was admitted with fatigue and left-sided clumsiness.  MRI/a of the brain showed no acute stroke with mild to moderate stenoses of the P2 segments of the posterior cerebral arteries bilaterally and mild stenosis of the right internal carotid artery terminus in the distal left M1 segment.  Echo showed an EF of 55 to 6% with grade 1 diastolic dysfunction, normal RV function, trivial MR, mild to moderate AI,  and mildly dilated ascending aorta at 42 mm.    He continued to report exertional angina July 2025 office visit and we opted to pursue diagnostic catheterization, which showed progression of ostial/proximal LAD disease to 90% with otherwise stable, moderate multivessel nonobstructive coronary disease.  The LAD was felt to be the culprit vessel and this was successfully treated with a 3.5 x 15 mm Onyx frontier drug-eluting stent.  Postprocedure, he developed left arm numbness and weakness, similar to prior symptoms.  Head CT showed no acute findings.  He was seen by neurology with initial recommendation for BP 140/90 in the setting of known carotid and cerebrovascular disease and metoprolol  and Entresto  were discontinued.  Postprocedure creatinine was stable at 1.54 no mild leukocytosis was noted with a WBC of 16.7 (Received steroids periprocedurally).  He was discharged home on aspirin , Plavix , and Eliquis  x 1 week at which point, aspirin  was to be discontinued.  At Sept 2025 follow up, he was doing well with resolution of chest pain and dyspnea. Noted residual left arm numbness, lack of coordination. He was noted to be in afib and was referred back to EP, who he saw in 04/2024.  Decision was made to allow for tikosyn  washout and amiodarone  was loaded.         Today he reports feeling well overall, with occasional PVCs that do not cause any symptoms. Denies chest pain, pressure, tightness, dyspnea, lightheadedness/dizziness. He in unaware of when he is in AFib, but is in sinus today. He reports mild ankle edema if traveling however nothing appreciable on daily basis. He has residual, stable left arm numbness since mid-2025.  He maintains an active lifestyle walking 3/4 - 1.5 miles several times a week with his border collie. He has had no bleeding complications on eliquis  + plavix , however had a fall 12/2023 without head injury. Inquires today about remaining on both medications long term. His PCP added  losartan 12.5mg  daily for BP in 04/2024 and home SBPs average in 130s.   ROS: all other systems reviewed and negative unless specified.   Objective   Home Medications    Current Outpatient Medications  Medication Sig Dispense Refill   acetaminophen  (TYLENOL ) 500 MG tablet Take 500-1,000 mg by mouth every 6 (six) hours as needed for mild pain (pain score 1-3) or fever.     amiodarone  (PACERONE ) 200 MG tablet Take 1 pill twice a day (morning  and evening) for 2 weeks, then reduce to 1 pill once a day (Patient taking differently: Take 200 mg by mouth daily.) 90 tablet 3   apixaban  (ELIQUIS ) 2.5 MG TABS tablet Take 1 tablet (2.5 mg total) by mouth 2 (two) times daily. Start 7/8 PM dose 90 tablet 3   Artificial Tears ophthalmic solution Place 1 drop into both eyes in the morning, at noon, in the evening, and at bedtime.     atorvastatin  (LIPITOR ) 80 MG tablet Take 1 tablet (80 mg total) by mouth daily. 90 tablet 3   buPROPion  (WELLBUTRIN  XL) 150 MG 24 hr tablet Take 150 mg by mouth daily.     calcium  carbonate (TUMS - DOSED IN MG ELEMENTAL CALCIUM ) 500 MG chewable tablet Chew 1-2 tablets by mouth daily as needed for indigestion.     clopidogrel  (PLAVIX ) 75 MG tablet Take 1 tablet (75 mg total) by mouth daily with breakfast. 90 tablet 3   Coenzyme Q10 (COQ10) 100 MG CAPS Take 100 mg by mouth daily.     docusate sodium  (COLACE) 100 MG capsule Take 100 mg by mouth daily as needed for mild constipation.     DULoxetine  (CYMBALTA ) 20 MG capsule Take 20 mg by mouth 2 (two) times daily.     febuxostat  (ULORIC ) 40 MG tablet Take 1 tablet (40 mg total) by mouth daily. 90 tablet 3   ketoconazole  (NIZORAL ) 2 % cream Apply to the feet every night to prevent fungal infection. 60 g 11   losartan (COZAAR) 25 MG tablet Take 25 mg by mouth daily. (Patient taking differently: Take 12.5 mg by mouth daily.)     metroNIDAZOLE  (FLAGYL ) 500 MG tablet TAKE 1 TABLET BY MOUTH TWICE DAILY ON SUNDAY AND MONDAY. 90 tablet 3    NEEDLE, DISP, 18 G (BD DISP NEEDLES) 18G X 1-1/2 MISC 1 mg by Does not apply route every 14 (fourteen) days. 50 each 0   NEEDLE, DISP, 21 G (BD DISP NEEDLES) 21G X 1-1/2 MISC 1 mg by Does not apply route every 14 (fourteen) days. 50 each 0   nitroGLYCERIN  (NITROSTAT ) 0.4 MG SL tablet Place 1 tablet (0.4 mg total) under the tongue every 5 (five) minutes as needed for chest pain. 25 tablet 3   pantoprazole  (PROTONIX ) 40 MG tablet Take 1 tablet (40 mg total) by mouth daily. 90 tablet 3   Syringe, Disposable, (2-3CC SYRINGE) 3 ML MISC 1 mg by Does not apply route every 14 (fourteen) days. 25 each 3   terbinafine  (LAMISIL ) 250 MG tablet Take 1 tablet (250 mg total) by mouth daily. 30 tablet 0   Testosterone  1.62 % GEL Apply 2 Pump topically daily. APPLY 2 PUMPS DAILY 75 g 3   testosterone  cypionate (DEPOTESTOSTERONE CYPIONATE) 200 MG/ML injection INJECT 1 ML (200 MG TOTAL) INTO THE MUSCLE EVERY 28 DAYS 10 mL 0   doxycycline  (VIBRA -TABS) 100 MG tablet Take 100 mg by mouth daily. (Patient not taking: Reported on 06/04/2024)     metoprolol  succinate (TOPROL -XL) 25 MG 24 hr tablet Take 2 tablets (50 mg total) by mouth at bedtime. Take with or immediately following a meal. (Patient not taking: Reported on 06/04/2024) 90 tablet 3   sacubitril -valsartan  (ENTRESTO ) 24-26 MG Take 1 tablet by mouth 2 (two) times daily. (Patient not taking: Reported on 06/04/2024) 180 tablet 3   No current facility-administered medications for this visit.     Physical Exam    VS:  BP (!) 158/80 (BP Location: Left Arm, Patient Position: Sitting, Cuff  Size: Normal)   Pulse 74   Wt 158 lb 4 oz (71.8 kg)   SpO2 97%   BMI 26.33 kg/m  , BMI Body mass index is 26.33 kg/m.    GEN: Well nourished, well developed, in no acute distress. HEENT: normal. Neck: Supple, no JVD, carotid bruits, or masses. Cardiac: RRR, 1/6 syst murmur throughout, no rubs, or gallops. No clubbing, cyanosis, edema.  Radials 2+/PT 2+ and equal bilaterally.   Respiratory:  Respirations regular and unlabored, clear to auscultation bilaterally. GI: Soft, nontender, nondistended, BS + x 4. MS: no deformity or atrophy. Skin: warm and dry, no rash. Neuro:  Strength and sensation are intact. Psych: Normal affect.  Accessory Clinical Findings    ECG personally reviewed by me today - EKG Interpretation Date/Time:  Friday June 04 2024 13:41:08 EST Ventricular Rate:  74 PR Interval:  210 QRS Duration:  130 QT Interval:  420 QTC Calculation: 466 R Axis:   -29  Text Interpretation: AV dual-paced rhythm with prolonged AV conduction Confirmed by Vivienne Bruckner [phone]) on 06/04/2024 1:47:52 PM  - no acute changes.  Lab Results  Component Value Date   WBC 7.2 04/22/2024   HGB 14.4 05/12/2024   HCT 46.9 05/12/2024   MCV 91 04/22/2024   PLT 206 04/22/2024   Lab Results  Component Value Date   CREATININE 2.08 (H) 05/12/2024   BUN 28 (H) 05/12/2024   NA 141 05/12/2024   K 4.8 05/12/2024   CL 100 05/12/2024   CO2 25 05/12/2024   Lab Results  Component Value Date   ALT 12 04/22/2024   AST 22 04/22/2024   ALKPHOS 98 04/22/2024   BILITOT 0.4 04/22/2024   Lab Results  Component Value Date   CHOL 117 11/11/2023   HDL 55 11/11/2023   LDLCALC 56 11/11/2023   TRIG 30 11/11/2023   CHOLHDL 2.1 11/11/2023    Lab Results  Component Value Date   HGBA1C 6.2 (H) 10/28/2023   Lab Results  Component Value Date   TSH 2.170 04/22/2024       Assessment & Plan    1. CAD: Multivessel CAD w/elective intervention 11/2023 (angioplasty and 3.5x15 DES to prox LAD). He reports no anginal symptoms today and is able to walk for exercise several times weekly without symptoms. He remains on plavix  and eliquis  and from a cardiac standpoint, he can d/c plavix  6 months post-intervention, since he remains on eliquis . He is unsure if he wants to come off of clopidogrel  b/c a neurologist friend told him he might want to continue in the setting of  cerebrovascular disease.  I encouraged discussion with his neurologist re: remaining on antiplatelet + anticoag long term, but he's ok to come off of it now from a post-stent standpoint. # Continue GDMT: atorvastatin  80mg  daily; no ASA d/t eliquis ; no BB d/t neurology recommendations re: permissive hypertension.  # Encourage physical activity, aiming for 150 minutes weekly of moderate-intensity exercise  2. Mixed ischemic/NICM/HF improved EF: Echo 10/2023 with LVEF 55-60%, no RWMA, G1DD, normal RV function, trivial mitral regurg, mild-mod aortic regurg, dilated ascending aorta at 42mm; GDMT: losartan 12.5mg  (added by PCP 04/2024), with addition of other medications limited as noted above re: BP. # Continue losartan 12.5mg  daily  3. HTN: BP today elevated at 158/80, with home readings averaging 130s systolic on losartan 12.5mg  daily. Neurology recommended permissive hypertension given cerebrovascular disease. BMET 05/12/24 with BUN/Creat 28/2.08, K 4.8 # Continue losartan as above.   4. HLD, goal LDL <70:  Labs 11/2023 with TC 117, LDL 56 on atorvastatin  80mg  daily.  # Continue therapy. Consider repeat lipids in 6 months.   5. Persistent Afib/Flutter / CHB s/p CRT-P / Chronic Anticoagulation: Follows with [facility]. He had increased AFib burden at 04/2024 visit with bilateral LE edema, and was subsequently transitioned from tikosyn  >> amiodarone . TSH, LFTs WNL on 04/2024 labs. EKG today SR @ 74 . QTc stable @ 466 in setting of paced rhythm.  He is not aware of when in AFib. No symptoms of dyspnea, palpitations reported. No bleeding complications on eliquis  2.5mg  BID. CHA2DS2-VASc Score = 7  # Continue DOAC as above # Continue amiodarone  200mg  daily # Consider repeat TSH, LFTs at EP f/u next month.  Mild restrictive dzs on PFTs in 2019.  6. Mitral Regurgitation/Aortic Insufficiency: Echo 10/2023 with trivial mitral regurg, mild-mod aortic regurg. Continue to follow clinically.   7. Cerebrovascular  disease: Following with Dr. Maree at [facility]. Continue with recommendations of permissive hypertension (130-140 / 70-80), LDL goal <70. Encouraged discussion of plavix  continuation with this care provider.   Disp: as scheduled with EP APP 06/2024 and in 3-4 months with Dr. Darron Lonni Meager, NP 06/04/2024, 4:24 PM     [1]  Allergies Allergen Reactions   Other Other (See Comments)    Vicryl sutures - patient states that site becomes soupy   Baclofen     Severe delirium    Iodinated Contrast Media Hives   Iodine Hives and Other (See Comments)    IVP contrast   Penicillins Itching, Rash and Other (See Comments)    ITCHY FEELING IN FINGERS; Tolerated Zosyn  04/2023   "

## 2024-06-04 NOTE — Patient Instructions (Signed)
 Medication Instructions:  No changes *If you need a refill on your cardiac medications before your next appointment, please call your pharmacy*  Lab Work: None ordered If you have labs (blood work) drawn today and your tests are completely normal, you will receive your results only by: MyChart Message (if you have MyChart) OR A paper copy in the mail If you have any lab test that is abnormal or we need to change your treatment, we will call you to review the results.  Testing/Procedures: None ordered  Follow-Up: At [facility], you and your health needs are our priority.  As part of our continuing mission to provide you with exceptional heart care, our providers are all part of one team.  This team includes your primary Cardiologist (physician) and Advanced Practice Providers or APPs (Physician Assistants and Nurse Practitioners) who all work together to provide you with the care you need, when you need it.  Your next appointment:   3 month(s)  Provider:   [name], MD or Lonni Meager, NP    We recommend signing up for the patient portal called MyChart.  Sign up information is provided on this After Visit Summary.  MyChart is used to connect with patients for Virtual Visits (Telemedicine).  Patients are able to view lab/test results, encounter notes, upcoming appointments, etc.  Non-urgent messages can be sent to your provider as well.   To learn more about what you can do with MyChart, go to [email or web address].

## 2024-06-07 ENCOUNTER — Ambulatory Visit

## 2024-06-07 ENCOUNTER — Encounter

## 2024-06-09 ENCOUNTER — Encounter

## 2024-06-09 ENCOUNTER — Ambulatory Visit

## 2024-06-14 ENCOUNTER — Ambulatory Visit

## 2024-06-14 ENCOUNTER — Encounter

## 2024-06-16 ENCOUNTER — Ambulatory Visit

## 2024-06-16 ENCOUNTER — Encounter

## 2024-06-21 ENCOUNTER — Ambulatory Visit

## 2024-06-21 ENCOUNTER — Encounter

## 2024-06-23 ENCOUNTER — Ambulatory Visit: Admitting: Cardiology

## 2024-06-23 ENCOUNTER — Encounter

## 2024-06-23 ENCOUNTER — Ambulatory Visit

## 2024-06-28 ENCOUNTER — Encounter

## 2024-06-28 ENCOUNTER — Ambulatory Visit: Admitting: Physical Therapy

## 2024-06-30 ENCOUNTER — Ambulatory Visit

## 2024-06-30 ENCOUNTER — Encounter

## 2024-07-05 ENCOUNTER — Ambulatory Visit: Admitting: Physical Therapy

## 2024-07-05 ENCOUNTER — Encounter

## 2024-07-07 ENCOUNTER — Encounter

## 2024-07-07 ENCOUNTER — Ambulatory Visit

## 2024-07-12 ENCOUNTER — Encounter

## 2024-07-12 ENCOUNTER — Ambulatory Visit: Admitting: Physical Therapy

## 2024-07-13 ENCOUNTER — Ambulatory Visit: Admitting: Dermatology

## 2024-07-14 ENCOUNTER — Ambulatory Visit

## 2024-07-14 ENCOUNTER — Encounter

## 2024-08-03 ENCOUNTER — Encounter

## 2024-09-10 ENCOUNTER — Ambulatory Visit: Admitting: Cardiovascular Disease

## 2024-11-02 ENCOUNTER — Encounter
